# Patient Record
Sex: Male | Born: 1960 | Race: White | Hispanic: No | Marital: Married | Smoking: Former smoker
Health system: Southern US, Community
[De-identification: ages and names within clinical notes are randomized; demographics above are authoritative.]

## PROBLEM LIST (undated history)

## (undated) ENCOUNTER — Emergency Department (HOSPITAL_COMMUNITY)
Discharge status: Against Medical Advice | Payer: Self-pay | Attending: Emergency Medicine | Admitting: Emergency Medicine

## (undated) DIAGNOSIS — R490 Dysphonia: Secondary | ICD-10-CM

## (undated) DIAGNOSIS — R7303 Prediabetes: Secondary | ICD-10-CM

## (undated) DIAGNOSIS — C801 Malignant (primary) neoplasm, unspecified: Secondary | ICD-10-CM

## (undated) DIAGNOSIS — C7931 Secondary malignant neoplasm of brain: Secondary | ICD-10-CM

## (undated) DIAGNOSIS — Z87442 Personal history of urinary calculi: Secondary | ICD-10-CM

## (undated) DIAGNOSIS — G90513 Complex regional pain syndrome I of upper limb, bilateral: Secondary | ICD-10-CM

## (undated) DIAGNOSIS — J432 Centrilobular emphysema: Secondary | ICD-10-CM

## (undated) DIAGNOSIS — G540 Brachial plexus disorders: Secondary | ICD-10-CM

## (undated) DIAGNOSIS — J90 Pleural effusion, not elsewhere classified: Secondary | ICD-10-CM

## (undated) DIAGNOSIS — I10 Essential (primary) hypertension: Secondary | ICD-10-CM

## (undated) DIAGNOSIS — E039 Hypothyroidism, unspecified: Secondary | ICD-10-CM

## (undated) DIAGNOSIS — E119 Type 2 diabetes mellitus without complications: Secondary | ICD-10-CM

## (undated) DIAGNOSIS — I609 Nontraumatic subarachnoid hemorrhage, unspecified: Secondary | ICD-10-CM

## (undated) DIAGNOSIS — M792 Neuralgia and neuritis, unspecified: Secondary | ICD-10-CM

## (undated) DIAGNOSIS — J189 Pneumonia, unspecified organism: Secondary | ICD-10-CM

## (undated) DIAGNOSIS — C7949 Secondary malignant neoplasm of other parts of nervous system: Secondary | ICD-10-CM

## (undated) DIAGNOSIS — J449 Chronic obstructive pulmonary disease, unspecified: Secondary | ICD-10-CM

## (undated) DIAGNOSIS — J45909 Unspecified asthma, uncomplicated: Secondary | ICD-10-CM

## (undated) HISTORY — PX: COLONOSCOPY: SHX174

## (undated) HISTORY — DX: Essential (primary) hypertension: I10

## (undated) HISTORY — PX: LEG SURGERY: SHX1003

---
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
## 2010-01-20 ENCOUNTER — Telehealth (INDEPENDENT_AMBULATORY_CARE_PROVIDER_SITE_OTHER): Payer: Self-pay | Admitting: *Deleted

## 2010-01-21 ENCOUNTER — Telehealth (INDEPENDENT_AMBULATORY_CARE_PROVIDER_SITE_OTHER): Payer: Self-pay | Admitting: *Deleted

## 2010-01-22 ENCOUNTER — Ambulatory Visit: Payer: Self-pay

## 2010-01-22 ENCOUNTER — Encounter: Payer: Self-pay | Admitting: Cardiology

## 2010-01-22 ENCOUNTER — Ambulatory Visit: Payer: Self-pay | Admitting: Cardiology

## 2010-01-22 ENCOUNTER — Encounter (HOSPITAL_COMMUNITY): Admission: RE | Admit: 2010-01-22 | Discharge: 2010-04-09 | Payer: Self-pay | Admitting: Family Medicine

## 2010-03-31 ENCOUNTER — Telehealth (INDEPENDENT_AMBULATORY_CARE_PROVIDER_SITE_OTHER): Payer: Self-pay | Admitting: *Deleted

## 2010-04-08 ENCOUNTER — Telehealth (INDEPENDENT_AMBULATORY_CARE_PROVIDER_SITE_OTHER): Payer: Self-pay | Admitting: *Deleted

## 2010-09-02 NOTE — Progress Notes (Signed)
  Phone Note Other Incoming   Request: Send information Summary of Call: Request received from [employer] forwarded to [employer].

## 2010-09-02 NOTE — Progress Notes (Signed)
  Records request recieved from [employer] sent to [employer]. Brian [name]  March 31, 2010 8:54 AM

## 2010-09-02 NOTE — Assessment & Plan Note (Signed)
Summary: Cardiology Nuclear Study  Nuclear Med Background Indications for Stress Test: Evaluation for Ischemia   History: GXT  History Comments:  ~21yrs ago GXT:OK per patient  Symptoms: Chest Pressure, Dizziness, Fatigue, Light-Headedness, Rapid HR  Symptoms Comments: Stress. Last episode of CP:2 weeks ago.   Nuclear Pre-Procedure Cardiac Risk Factors: History of Smoking, Hypertension Caffeine/Decaff Intake: None NPO After: 7:30 PM Lungs: Clear IV 0.9% NS with Angio Cath: 20g     IV Site: (R) AC IV Started by: Irean Hong RN Chest Size (in) 40     Height (in): 68 Weight (lb): 179 BMI: 27.32  Nuclear Med Study 1 or 2 day study:  1 day     Stress Test Type:  Stress Reading MD:  Olga Millers, MD     Referring MD:  Keturah Barre, MD Resting Radionuclide:  Technetium 3m Tetrofosmin     Resting Radionuclide Dose:  11 mCi  Stress Radionuclide:  Technetium 68m Tetrofosmin     Stress Radionuclide Dose:  33 mCi   Stress Protocol Exercise Time (min):  11:31 min     Max HR:  173 bpm     Predicted Max HR:  174 bpm  Max Systolic BP: 165 mm Hg     Percent Max HR:  99.43 %     METS: 13.7 Rate Pressure Product:  47829    Stress Test Technologist:  [employer] CMA-N     Nuclear Technologist:  Domenic Polite CNMT  Rest Procedure  Myocardial perfusion imaging was performed at rest 45 minutes following the intravenous administration of Myoview Technetium 22m Tetrofosmin.  Stress Procedure  The patient exercised for 11:31.  The patient stopped due to fatigue.  He did c/o chest pressure, 5/10, at peak exercise; question related to dyspnea, it completely resolved after stopping.  There were no diagnostic ST-T wave changes.  Myoview was injected at peak exercise and myocardial perfusion imaging was performed after a brief delay.  QPS Raw Data Images:  Acuisition technically good; normal left ventricular size. Stress Images:  There is normal uptake in all areas. Rest Images:  Normal  homogeneous uptake in all areas of the myocardium. Subtraction (SDS):  No evidence of ischemia. Transient Ischemic Dilatation:  1.07  (Normal <1.22)  Lung/Heart Ratio:  .3  (Normal <0.45)  Quantitative Gated Spect Images QGS EDV:  95 ml QGS ESV:  47 ml QGS EF:  51 % QGS cine images:  Normal wall motion.   Overall Impression  Exercise Capacity: Excellent exercise capacity. BP Response: Normal blood pressure response. Clinical Symptoms: There is chest pain ECG Impression: No significant ST segment change suggestive of ischemia. Overall Impression: There is no sign of scar or ischemia.

## 2010-09-02 NOTE — Progress Notes (Signed)
Summary: Nuclear Pre-Procedure  Phone Note Outgoing Call Call back at Work Phone  Call back at [phone]   Call placed by: Stanton Kidney, EMT-P,  January 21, 2010 10:23 AM Call placed to: Patient Action Taken: Phone Call Completed Summary of Call: Reviewed information on Myoview Information Sheet (see scanned document for further details).  Spoke with Patient.    Nuclear Med Background Indications for Stress Test: Evaluation for Ischemia     Symptoms: Chest Pain, Chest Pressure, DOE    Nuclear Pre-Procedure Cardiac Risk Factors: Hypertension

## 2010-09-02 NOTE — Progress Notes (Signed)
Summary: Nuclear pre procedure  Phone Note Outgoing Call Call back at Home Phone [phone]   Call placed by: [employer], CMA,  January 20, 2010 3:33 PM Call placed to: Patient Summary of Call: Left message with information on Myoview Information Sheet (see scanned document for details).      Nuclear Med Background Indications for Stress Test: Evaluation for Ischemia     Symptoms: Chest Pain, Chest Pressure, DOE    Nuclear Pre-Procedure Cardiac Risk Factors: Hypertension

## 2011-07-31 DIAGNOSIS — Z0389 Encounter for observation for other suspected diseases and conditions ruled out: Secondary | ICD-10-CM | POA: Insufficient documentation

## 2013-09-05 ENCOUNTER — Encounter: Payer: Self-pay | Admitting: Internal Medicine

## 2013-10-16 ENCOUNTER — Ambulatory Visit (INDEPENDENT_AMBULATORY_CARE_PROVIDER_SITE_OTHER): Payer: BC Managed Care – PPO

## 2013-10-16 ENCOUNTER — Encounter: Payer: Self-pay | Admitting: Podiatry

## 2013-10-16 ENCOUNTER — Ambulatory Visit (INDEPENDENT_AMBULATORY_CARE_PROVIDER_SITE_OTHER): Payer: BC Managed Care – PPO | Admitting: Podiatry

## 2013-10-16 VITALS — BP 141/80 | HR 79 | Resp 12

## 2013-10-16 DIAGNOSIS — M722 Plantar fascial fibromatosis: Secondary | ICD-10-CM

## 2013-10-16 MED ORDER — TRIAMCINOLONE ACETONIDE 10 MG/ML IJ SUSP
10.0000 mg | Freq: Once | INTRAMUSCULAR | Status: AC
  Administered 2013-10-16: 10 mg

## 2013-10-16 MED ORDER — DICLOFENAC SODIUM 75 MG PO TBEC: 75.0000 mg | DELAYED_RELEASE_TABLET | Freq: Two times a day (BID) | ORAL | Status: DC

## 2013-10-16 NOTE — Patient Instructions (Signed)

## 2013-10-16 NOTE — Progress Notes (Signed)
   Subjective:    Patient ID: Brian [name], male    DOB: March 09, 1961, 53 y.o.   MRN: [id number]  HPI PT STATED RT FOOT ARCH AND HEEL IS PAINFUL FOR 8 MONTHS. THE FOOT IS LITTLE GETTING BETTER. THE FOOT GET AGGRAVATED FIRST THING IN THE MORNING STEPPING DOWN THE BED AND PRESSURE. TRIED  STRETCHING AND HELPS A LITTLE SOME.    Review of Systems  HENT: Positive for sinus pressure.   Neurological: Positive for headaches.  All other systems reviewed and are negative.       Objective:   Physical Exam        Assessment & Plan:

## 2013-10-16 NOTE — Progress Notes (Signed)
Subjective:     Patient ID: Brian [name], male   DOB: [birth year], 53 y.o.   MRN: [id number]  HPI patient states my right heel has been killing me and makes it very hard for me to exercise or do any form of activity or work with comfort. Patient states the pain has been present for approximately 8 months   Review of Systems  All other systems reviewed and are negative.       Objective:   Physical Exam  Nursing note and vitals reviewed. Constitutional: He is oriented to person, place, and time.  Cardiovascular: Intact distal pulses.   Musculoskeletal: Normal range of motion.  Neurological: He is oriented to person, place, and time.  Skin: Skin is warm.       Assessment:     Plantar fasciitis of the right heel with inflammation and fluid buildup    Plan:     H&P and x-ray reviewed and today I injected the right plantar fascia 3 mg Kenalog 5 mg Xylocaine Marcaine mixture and applied fascially brace with instructions on usage

## 2013-10-18 ENCOUNTER — Ambulatory Visit (AMBULATORY_SURGERY_CENTER): Payer: Self-pay | Admitting: *Deleted

## 2013-10-18 VITALS — Ht 68.0 in | Wt 188.8 lb

## 2013-10-18 DIAGNOSIS — Z1211 Encounter for screening for malignant neoplasm of colon: Secondary | ICD-10-CM

## 2013-10-18 MED ORDER — NA SULFATE-K SULFATE-MG SULF 17.5-3.13-1.6 GM/177ML PO SOLN: ORAL | Status: DC

## 2013-10-18 NOTE — Progress Notes (Signed)
Patient denies any allergies to eggs or soy. Patient denies any problems with anesthesia.  

## 2013-10-25 ENCOUNTER — Ambulatory Visit (INDEPENDENT_AMBULATORY_CARE_PROVIDER_SITE_OTHER): Payer: BC Managed Care – PPO | Admitting: Podiatry

## 2013-10-25 ENCOUNTER — Encounter: Payer: Self-pay | Admitting: Podiatry

## 2013-10-25 ENCOUNTER — Encounter: Payer: Self-pay | Admitting: Internal Medicine

## 2013-10-25 VITALS — BP 153/88 | HR 88 | Resp 16

## 2013-10-25 DIAGNOSIS — M722 Plantar fascial fibromatosis: Secondary | ICD-10-CM

## 2013-10-26 NOTE — Progress Notes (Signed)
Subjective:     Patient ID: Brian [name], male   DOB: 1960-09-05, 53 y.o.   MRN: [id number]  HPI patient states my heel is improving with brace but I still have discomfort with a lot of ambulation or after periods of sitting   Review of Systems     Objective:   Physical Exam Neurovascular status intact with depressed arch noted both feet indicating mechanical dysfunction with inflammation and pain still noted at the medial band right    Assessment:     Plantar fasciitis improved but still tender upon palpation    Plan:     Explained mechanical element of condition and the importance of physical therapy. I scanned for custom orthotics to reduce stress against the heels and he will reappoint 2 weeks for fitting

## 2013-11-01 ENCOUNTER — Encounter: Payer: Self-pay | Admitting: Internal Medicine

## 2013-11-17 ENCOUNTER — Ambulatory Visit (AMBULATORY_SURGERY_CENTER): Payer: BC Managed Care – PPO | Admitting: Internal Medicine

## 2013-11-17 ENCOUNTER — Encounter: Payer: Self-pay | Admitting: Internal Medicine

## 2013-11-17 VITALS — BP 133/90 | HR 61 | Temp 98.2°F | Resp 13 | Ht 68.0 in | Wt 188.0 lb

## 2013-11-17 DIAGNOSIS — Z1211 Encounter for screening for malignant neoplasm of colon: Secondary | ICD-10-CM

## 2013-11-17 MED ORDER — SODIUM CHLORIDE 0.9 % IV SOLN: 500.0000 mL | INTRAVENOUS | Status: DC

## 2013-11-17 NOTE — Progress Notes (Signed)
Report to pacu rn, vss, bbs=clear 

## 2013-11-17 NOTE — Op Note (Signed)
[place]  [address] [place], [id number]   COLONOSCOPY PROCEDURE REPORT  PATIENT: Brian [name], Brian [name]  MR#: [id number] BIRTHDATE: 10-16-1960 , 53  yrs. old GENDER: Male ENDOSCOPIST: Gatha Mayer, MD, [facility] REFERRED QM:VHQION Unk Lightning, M.D. PROCEDURE DATE:  11/17/2013 PROCEDURE:   Colonoscopy, screening First Screening Colonoscopy - Avg.  risk and is 50 yrs.  old or older Yes.  Prior Negative Screening - Now for repeat screening. N/A  History of Adenoma - Now for follow-up colonoscopy & has been > or = to 3 yrs.  N/A  Polyps Removed Today? No.  Recommend repeat exam, <10 yrs? No. ASA CLASS:   Class II INDICATIONS:average risk screening and first colonoscopy. MEDICATIONS: propofol (Diprivan) 300mg  IV, MAC sedation, administered by CRNA, and These medications were titrated to patient response per physician's verbal order  DESCRIPTION OF PROCEDURE:   After the risks benefits and alternatives of the procedure were thoroughly explained, informed consent was obtained.  A digital rectal exam revealed no abnormalities of the rectum, A digital rectal exam revealed no prostatic nodules, and A digital rectal exam revealed the prostate was not enlarged.   The LB [id number]  endoscope was introduced through the anus and advanced to the cecum, which was identified by both the appendix and ileocecal valve. No adverse events experienced.   The quality of the prep was excellent using Suprep  The instrument was then slowly withdrawn as the colon was fully examined.      COLON FINDINGS: A normal appearing cecum, ileocecal valve, and appendiceal orifice were identified.  The ascending, hepatic flexure, transverse, splenic flexure, descending, sigmoid colon and rectum appeared unremarkable.  No polyps or cancers were seen.   A right colon retroflexion was performed.  Retroflexed views revealed no abnormalities. The time to cecum=1 minutes 46 seconds. Withdrawal time=10  minutes 23 seconds.  The scope was withdrawn and the procedure completed. COMPLICATIONS: There were no complications.  ENDOSCOPIC IMPRESSION: Normal colonoscopy - excellent prep  RECOMMENDATIONS: Repeat colonoscopy 10 years.   eSigned:  Gatha Mayer, MD, [facility] 11/17/2013 9:44 AM   cc: The Patient and Janace Litten, MD

## 2013-11-17 NOTE — Patient Instructions (Addendum)
Your colonoscopy was normal. No polyps!  Next routine colonoscopy in 10 years - 2025  I appreciate the opportunity to care for you. Gatha Mayer, MD, FACG  YOU HAD AN ENDOSCOPIC PROCEDURE TODAY AT [facility]: Refer to the procedure report that was given to you for any specific questions about what was found during the examination.  If the procedure report does not answer your questions, please call your gastroenterologist to clarify.  If you requested that your care partner not be given the details of your procedure findings, then the procedure report has been included in a sealed envelope for you to review at your convenience later.  YOU SHOULD EXPECT: Some feelings of bloating in the abdomen. Passage of more gas than usual.  Walking can help get rid of the air that was put into your GI tract during the procedure and reduce the bloating. If you had a lower endoscopy (such as a colonoscopy or flexible sigmoidoscopy) you may notice spotting of blood in your stool or on the toilet paper. If you underwent a bowel prep for your procedure, then you may not have a normal bowel movement for a few days.  DIET: Your first meal following the procedure should be a light meal and then it is ok to progress to your normal diet.  A half-sandwich or bowl of soup is an example of a good first meal.  Heavy or fried foods are harder to digest and may make you feel nauseous or bloated.  Likewise meals heavy in dairy and vegetables can cause extra gas to form and this can also increase the bloating.  Drink plenty of fluids but you should avoid alcoholic beverages for 24 hours.  ACTIVITY: Your care partner should take you home directly after the procedure.  You should plan to take it easy, moving slowly for the rest of the day.  You can resume normal activity the day after the procedure however you should NOT DRIVE or use heavy machinery for 24 hours (because of the sedation medicines used during the  test).    SYMPTOMS TO REPORT IMMEDIATELY: A gastroenterologist can be reached at any hour.  During normal business hours, 8:30 AM to 5:00 PM Monday through Friday, call [phone].  After hours and on weekends, please call the GI answering service at [phone] who will take a message and have the physician on call contact you.   Following lower endoscopy (colonoscopy or flexible sigmoidoscopy):  Excessive amounts of blood in the stool  Significant tenderness or worsening of abdominal pains  Swelling of the abdomen that is new, acute  Fever of 100F or higher    FOLLOW UP: If any biopsies were taken you will be contacted by phone or by letter within the next 1-3 weeks.  Call your gastroenterologist if you have not heard about the biopsies in 3 weeks.  Our staff will call the home number listed on your records the next business day following your procedure to check on you and address any questions or concerns that you may have at that time regarding the information given to you following your procedure. This is a courtesy call and so if there is no answer at the home number and we have not heard from you through the emergency physician on call, we will assume that you have returned to your regular daily activities without incident.  SIGNATURES/CONFIDENTIALITY: You and/or your care partner have signed paperwork which will be entered into your electronic medical  record.  These signatures attest to the fact that that the information above on your After Visit Summary has been reviewed and is understood.  Full responsibility of the confidentiality of this discharge information lies with you and/or your care-partner.

## 2013-11-20 ENCOUNTER — Telehealth: Payer: Self-pay | Admitting: *Deleted

## 2013-11-20 NOTE — Telephone Encounter (Signed)
No answer, left message to call if questions or concerns. 

## 2013-12-21 ENCOUNTER — Telehealth: Payer: Self-pay | Admitting: *Deleted

## 2013-12-21 NOTE — Telephone Encounter (Signed)
I left him a message that the orthotics should be here some time next week.  We called the company and had them to put a rush on it.  We will call once they come in.  Please call if you have any further questions or concerns.

## 2013-12-21 NOTE — Telephone Encounter (Signed)
Calling to chek on prosthetic insoles that have been ordered for me.

## 2014-01-03 ENCOUNTER — Telehealth: Payer: Self-pay | Admitting: *Deleted

## 2014-01-03 NOTE — Telephone Encounter (Signed)
Calling to check on orthotic order that's been out there for a couple of months now.  I was told I would receive a call last week and I haven't received it.  I'd appreciate a call.  I returned his call and informed him the orthotics are here.  I informed him he will need to schedule an appointment to pick them up.  He asked why.  I told him to make sure they fit properly.  He asked to schedule an appointment for Monday.  I transferred him to a scheduler.

## 2014-01-11 ENCOUNTER — Other Ambulatory Visit: Payer: BC Managed Care – PPO

## 2014-08-03 DIAGNOSIS — I609 Nontraumatic subarachnoid hemorrhage, unspecified: Secondary | ICD-10-CM

## 2014-08-03 HISTORY — DX: Nontraumatic subarachnoid hemorrhage, unspecified: I60.9

## 2015-05-21 ENCOUNTER — Encounter (HOSPITAL_COMMUNITY): Payer: Self-pay | Admitting: Vascular Surgery

## 2015-05-21 ENCOUNTER — Inpatient Hospital Stay (HOSPITAL_COMMUNITY)
Admission: EM | Admit: 2015-05-21 | Discharge: 2015-05-29 | DRG: 066 | Disposition: A | Discharge status: Home / Self Care | Payer: BLUE CROSS/BLUE SHIELD | Attending: Neurosurgery | Admitting: Neurosurgery

## 2015-05-21 ENCOUNTER — Emergency Department (HOSPITAL_COMMUNITY): Payer: BLUE CROSS/BLUE SHIELD

## 2015-05-21 DIAGNOSIS — I10 Essential (primary) hypertension: Secondary | ICD-10-CM | POA: Diagnosis present

## 2015-05-21 DIAGNOSIS — E785 Hyperlipidemia, unspecified: Secondary | ICD-10-CM | POA: Diagnosis present

## 2015-05-21 DIAGNOSIS — I608 Other nontraumatic subarachnoid hemorrhage: Secondary | ICD-10-CM | POA: Diagnosis not present

## 2015-05-21 DIAGNOSIS — R51 Headache: Secondary | ICD-10-CM | POA: Diagnosis not present

## 2015-05-21 DIAGNOSIS — I609 Nontraumatic subarachnoid hemorrhage, unspecified: Secondary | ICD-10-CM

## 2015-05-21 HISTORY — DX: Type 2 diabetes mellitus without complications: E11.9

## 2015-05-21 LAB — DIFFERENTIAL
BASOS ABS: 0 10*3/uL (ref 0.0–0.1)
Basophils Relative: 0 %
EOS ABS: 0.4 10*3/uL (ref 0.0–0.7)
Eosinophils Relative: 4 %
LYMPHS ABS: 2.6 10*3/uL (ref 0.7–4.0)
LYMPHS PCT: 24 %
MONOS PCT: 11 %
Monocytes Absolute: 1.2 10*3/uL — ABNORMAL HIGH (ref 0.1–1.0)
NEUTROS ABS: 6.6 10*3/uL (ref 1.7–7.7)
NEUTROS PCT: 61 %

## 2015-05-21 LAB — I-STAT CHEM 8, ED
BUN: 25 mg/dL — ABNORMAL HIGH (ref 6–20)
CHLORIDE: 102 mmol/L (ref 101–111)
Calcium, Ion: 1.1 mmol/L — ABNORMAL LOW (ref 1.12–1.23)
Creatinine, Ser: 0.9 mg/dL (ref 0.61–1.24)
GLUCOSE: 123 mg/dL — AB (ref 65–99)
HCT: 43 % (ref 39.0–52.0)
HEMOGLOBIN: 14.6 g/dL (ref 13.0–17.0)
POTASSIUM: 3.5 mmol/L (ref 3.5–5.1)
SODIUM: 136 mmol/L (ref 135–145)
TCO2: 21 mmol/L (ref 0–100)

## 2015-05-21 LAB — CBC
HEMATOCRIT: 40.2 % (ref 39.0–52.0)
HEMOGLOBIN: 13.8 g/dL (ref 13.0–17.0)
MCH: 30.7 pg (ref 26.0–34.0)
MCHC: 34.3 g/dL (ref 30.0–36.0)
MCV: 89.5 fL (ref 78.0–100.0)
Platelets: 166 10*3/uL (ref 150–400)
RBC: 4.49 MIL/uL (ref 4.22–5.81)
RDW: 12.8 % (ref 11.5–15.5)
WBC: 10.8 10*3/uL — AB (ref 4.0–10.5)

## 2015-05-21 LAB — PROTIME-INR
INR: 1.13 (ref 0.00–1.49)
Prothrombin Time: 14.7 seconds (ref 11.6–15.2)

## 2015-05-21 LAB — I-STAT TROPONIN, ED: Troponin i, poc: 0 ng/mL (ref 0.00–0.08)

## 2015-05-21 LAB — APTT: APTT: 28 s (ref 24–37)

## 2015-05-21 LAB — CBG MONITORING, ED: GLUCOSE-CAPILLARY: 121 mg/dL — AB (ref 65–99)

## 2015-05-21 MED ORDER — ONDANSETRON HCL 4 MG/2ML IJ SOLN
4.0000 mg | Freq: Once | INTRAMUSCULAR | Status: AC
  Administered 2015-05-22: 4 mg via INTRAVENOUS
  Filled 2015-05-21: qty 2

## 2015-05-21 MED ORDER — MORPHINE SULFATE (PF) 4 MG/ML IV SOLN
4.0000 mg | Freq: Once | INTRAVENOUS | Status: AC
  Administered 2015-05-22: 4 mg via INTRAVENOUS
  Filled 2015-05-21: qty 1

## 2015-05-21 MED ORDER — SODIUM CHLORIDE 0.9 % IV BOLUS (SEPSIS)
1000.0000 mL | Freq: Once | INTRAVENOUS | Status: AC
  Administered 2015-05-22: 1000 mL via INTRAVENOUS

## 2015-05-21 NOTE — ED Notes (Signed)
Spoke with pod A physician who stated to perform a head CT but do not need to call a Code Stroke.

## 2015-05-21 NOTE — ED Notes (Signed)
Pt reports to the ED for eval of sudden onset HA that began 2200. Pt has associated neck pain. Took 2 Aleve and a hot shower but nothing helped. Also having nausea Denies any active. Grips equal, no facial droop, no arm drift. Pt denies any numbness or unilateral weakness. Some left leg tingling that he just noticed. Denies any vision changes. Light makes the pain worse. Pt A&Ox4, resp e/u, and skin warm and dry.

## 2015-05-21 NOTE — ED Provider Notes (Signed)
CSN: [id number]     Arrival date & time 05/21/15  2304 History   By signing my name below, I, Forrestine Him, attest that this documentation has been prepared under the direction and in the presence of Merryl Hacker, MD.  Electronically Signed: Forrestine Him, ED Scribe. 05/21/2015. 11:58 PM.   Chief Complaint  Patient presents with  . Headache   The history is provided by the patient. No language interpreter was used.    HPI Comments: Brian [name] is a 54 y.o. male who presents to the Emergency Department complaining of sudden onset, constant, ongoing HA with associated neck pain and nausea onset 10:00 PM this evening. Currently he rates pain 10/10. No aggravating or alleviating factors at this time. Mild L lower extremity tingling also reported. OTC Aleve attempted prior to arrival without any improvement. Denies any fever, chills, chest pain, or shortness of breath. No numbness or unilateral weakness. Mr. Brar denies any history of same. He is not currently on any anticoagulants. No known allergies to medications.  PCP: [name], MD    Past Medical History  Diagnosis Date  . Hypertension    Past Surgical History  Procedure Laterality Date  . Leg surgery  age 67     left leg, from fracture   Family History  Problem Relation Age of Onset  . Colon cancer Neg Hx   . Stomach cancer Neg Hx    Social History  Substance Use Topics  . Smoking status: Never Smoker   . Smokeless tobacco: Never Used  . Alcohol Use: 2.5 oz/week    5 Standard drinks or equivalent per week     Comment: occasionally    Review of Systems  Constitutional: Negative for fever and chills.  Respiratory: Negative for cough and shortness of breath.   Cardiovascular: Negative for chest pain.  Gastrointestinal: Positive for nausea. Negative for vomiting and abdominal pain.  Musculoskeletal: Positive for neck pain. Negative for back pain.  Neurological: Positive for headaches. Negative for  dizziness, speech difficulty, weakness and numbness.  Psychiatric/Behavioral: Negative for confusion.  All other systems reviewed and are negative.     Allergies  Review of patient's allergies indicates no known allergies.  Home Medications   Prior to Admission medications   Medication Sig Start Date End Date Taking? Authorizing Provider  atorvastatin (LIPITOR) 20 MG tablet Take 20 mg by mouth daily.   Yes Historical Provider, MD  Cyanocobalamin (VITAMIN B 12 PO) Take 1 tablet by mouth daily.   Yes Historical Provider, MD  metFORMIN (GLUCOPHAGE) 500 MG tablet Take 500 mg by mouth daily with breakfast.   Yes Historical Provider, MD  Multiple Vitamin (MULTIVITAMIN) tablet Take 1 tablet by mouth daily.   Yes Historical Provider, MD  ramipril (ALTACE) 10 MG capsule Take 10 mg by mouth daily.   Yes Historical Provider, MD   Triage Vitals: BP 148/85 mmHg  Pulse 80  Temp(Src) 97.8 F (36.6 C) (Oral)  Resp 18  SpO2 100%   Physical Exam  Constitutional: He is oriented to person, place, and time. No distress.  Uncomfortable appearing, lying still on the bed, eyes closed  HENT:  Head: Normocephalic and atraumatic.  Eyes: EOM are normal. Pupils are equal, round, and reactive to light.  Neck: Normal range of motion. Neck supple.  Cardiovascular: Normal rate, regular rhythm and normal heart sounds.   No murmur heard. Pulmonary/Chest: Effort normal and breath sounds normal. No respiratory distress. He has no wheezes.  Abdominal: Soft. Bowel sounds are  normal. There is no tenderness. There is no rebound.  Musculoskeletal: He exhibits no edema.  Neurological: He is alert and oriented to person, place, and time.  Normal grip strength bilaterally, 5 out of 5 strength in the bilateral lower extremities, normal reflexes, cranial nerves II through XII intact  Skin: Skin is warm and dry.  Psychiatric: He has a normal mood and affect.  Nursing note and vitals reviewed.   ED Course  Procedures  (including critical care time)  CRITICAL CARE Performed by: Merryl Hacker   Total critical care time: 30 min  Critical care time was exclusive of separately billable procedures and treating other patients.  Critical care was necessary to treat or prevent imminent or life-threatening deterioration.  Critical care was time spent personally by me on the following activities: development of treatment plan with patient and/or surrogate as well as nursing, discussions with consultants, evaluation of patient's response to treatment, examination of patient, obtaining history from patient or surrogate, ordering and performing treatments and interventions, ordering and review of laboratory studies, ordering and review of radiographic studies, pulse oximetry and re-evaluation of patient's condition.   DIAGNOSTIC STUDIES: Oxygen Saturation is 100% on RA, Normal by my interpretation.    COORDINATION OF CARE: 11:50 PM- Will order CT head without contrast, PT-INR, APTT, CBC, CMP, i-stat troponin, CBG, and  i-stat chem 8. Discussed treatment plan with pt at bedside and pt agreed to plan.     Labs Review Labs Reviewed  CBC - Abnormal; Notable for the following:    WBC 10.8 (*)    All other components within normal limits  DIFFERENTIAL - Abnormal; Notable for the following:    Monocytes Absolute 1.2 (*)    All other components within normal limits  COMPREHENSIVE METABOLIC PANEL - Abnormal; Notable for the following:    Glucose, Bld 128 (*)    BUN 23 (*)    Total Protein 6.1 (*)    All other components within normal limits  CBG MONITORING, ED - Abnormal; Notable for the following:    Glucose-Capillary 121 (*)    All other components within normal limits  I-STAT CHEM 8, ED - Abnormal; Notable for the following:    BUN 25 (*)    Glucose, Bld 123 (*)    Calcium, Ion 1.10 (*)    All other components within normal limits  PROTIME-INR  APTT  I-STAT TROPOININ, ED    Imaging Review Ct Head  Wo Contrast  05/21/2015  CLINICAL DATA:  Acute onset of headache, nausea, vomiting and neck stiffness. Initial encounter. EXAM: CT HEAD WITHOUT CONTRAST TECHNIQUE: Contiguous axial images were obtained from the base of the skull through the vertex without intravenous contrast. COMPARISON:  None. FINDINGS: There is subarachnoid hemorrhage along the anterior aspect of the pons, tracking inferiorly along the brainstem. The pattern is compatible with perimesencephalic subarachnoid hemorrhage. Most commonly, this is non-aneurysmal in nature, though CTA of the head is recommended for further evaluation. The cerebellum, brainstem and fourth ventricle are within normal limits. The third and lateral ventricles, and basal ganglia are unremarkable in appearance. The cerebral hemispheres are symmetric in appearance, with normal gray-white differentiation. No mass effect or midline shift is seen. There is no evidence of fracture; visualized osseous structures are unremarkable in appearance. The orbits are within normal limits. The paranasal sinuses and mastoid air cells are well-aerated. No significant soft tissue abnormalities are seen. IMPRESSION: Subarachnoid hemorrhage along the anterior aspect of the pons, tracking inferiorly along the brainstem. The  pattern is compatible with perimesencephalic subarachnoid hemorrhage. Most commonly, this is non-aneurysmal in nature, though CTA of the head is recommended for further evaluation. Critical Value/emergent results were called by telephone at the time of interpretation on 05/21/2015 at 11:50 pm to Dr. Dina Rich, who verbally acknowledged these results. Electronically Signed   By: Garald Balding M.D.   On: 05/21/2015 23:59   I have personally reviewed and evaluated these images and lab results as part of my medical decision-making.   EKG Interpretation None      MDM   Final diagnoses:  Subarachnoid hemorrhage [facility])    Patient presents with acute onset headache at 10  PM. Was evaluated in triage. No focal deficits. CT scan obtained in triage and shows a subarachnoid hemorrhage. On my evaluation, patient continues to be neurologically intact. He is uncomfortable appearing but nontoxic. Blood pressure in the 130s. History of hypertension/hyperlipidemia.  Neurosurgery consulted. CT angiogram head ordered. Patient given fluids, pain medication, and Zofran. Will admit to neurosurgery, Dr.Nundkumar.  I personally performed the services described in this documentation, which was scribed in my presence. The recorded information has been reviewed and is accurate.   Merryl Hacker, MD 05/22/15 [phone]

## 2015-05-22 ENCOUNTER — Inpatient Hospital Stay (HOSPITAL_COMMUNITY): Payer: BLUE CROSS/BLUE SHIELD

## 2015-05-22 ENCOUNTER — Emergency Department (HOSPITAL_COMMUNITY): Payer: BLUE CROSS/BLUE SHIELD

## 2015-05-22 ENCOUNTER — Encounter (HOSPITAL_COMMUNITY): Payer: Self-pay

## 2015-05-22 ENCOUNTER — Ambulatory Visit (HOSPITAL_COMMUNITY): Payer: BLUE CROSS/BLUE SHIELD

## 2015-05-22 DIAGNOSIS — E785 Hyperlipidemia, unspecified: Secondary | ICD-10-CM | POA: Diagnosis present

## 2015-05-22 DIAGNOSIS — I609 Nontraumatic subarachnoid hemorrhage, unspecified: Secondary | ICD-10-CM

## 2015-05-22 DIAGNOSIS — I608 Other nontraumatic subarachnoid hemorrhage: Secondary | ICD-10-CM | POA: Diagnosis present

## 2015-05-22 DIAGNOSIS — I10 Essential (primary) hypertension: Secondary | ICD-10-CM | POA: Diagnosis present

## 2015-05-22 DIAGNOSIS — R51 Headache: Secondary | ICD-10-CM | POA: Diagnosis present

## 2015-05-22 LAB — COMPREHENSIVE METABOLIC PANEL
ALK PHOS: 48 U/L (ref 38–126)
ALT: 23 U/L (ref 17–63)
ANION GAP: 9 (ref 5–15)
AST: 22 U/L (ref 15–41)
Albumin: 3.9 g/dL (ref 3.5–5.0)
BUN: 23 mg/dL — ABNORMAL HIGH (ref 6–20)
CALCIUM: 9.3 mg/dL (ref 8.9–10.3)
CHLORIDE: 104 mmol/L (ref 101–111)
CO2: 23 mmol/L (ref 22–32)
CREATININE: 0.97 mg/dL (ref 0.61–1.24)
Glucose, Bld: 128 mg/dL — ABNORMAL HIGH (ref 65–99)
Potassium: 3.7 mmol/L (ref 3.5–5.1)
Sodium: 136 mmol/L (ref 135–145)
Total Bilirubin: 0.5 mg/dL (ref 0.3–1.2)
Total Protein: 6.1 g/dL — ABNORMAL LOW (ref 6.5–8.1)

## 2015-05-22 LAB — CBC
HCT: 36.9 % — ABNORMAL LOW (ref 39.0–52.0)
HEMOGLOBIN: 12.6 g/dL — AB (ref 13.0–17.0)
MCH: 30.4 pg (ref 26.0–34.0)
MCHC: 34.1 g/dL (ref 30.0–36.0)
MCV: 89.1 fL (ref 78.0–100.0)
PLATELETS: 149 10*3/uL — AB (ref 150–400)
RBC: 4.14 MIL/uL — AB (ref 4.22–5.81)
RDW: 12.7 % (ref 11.5–15.5)
WBC: 14.9 10*3/uL — ABNORMAL HIGH (ref 4.0–10.5)

## 2015-05-22 LAB — GLUCOSE, CAPILLARY
GLUCOSE-CAPILLARY: 102 mg/dL — AB (ref 65–99)
GLUCOSE-CAPILLARY: 116 mg/dL — AB (ref 65–99)
GLUCOSE-CAPILLARY: 85 mg/dL (ref 65–99)
Glucose-Capillary: 89 mg/dL (ref 65–99)

## 2015-05-22 LAB — MRSA PCR SCREENING: MRSA BY PCR: NEGATIVE

## 2015-05-22 MED ORDER — ACETAMINOPHEN 650 MG RE SUPP: 650.0000 mg | RECTAL | Status: DC | PRN

## 2015-05-22 MED ORDER — SENNOSIDES-DOCUSATE SODIUM 8.6-50 MG PO TABS
1.0000 | ORAL_TABLET | Freq: Two times a day (BID) | ORAL | Status: DC
  Administered 2015-05-22 – 2015-05-28 (×10): 1 via ORAL
  Filled 2015-05-22 (×13): qty 1

## 2015-05-22 MED ORDER — MORPHINE SULFATE (PF) 2 MG/ML IV SOLN
1.0000 mg | INTRAVENOUS | Status: DC | PRN
  Administered 2015-05-22 – 2015-05-25 (×6): 2 mg via INTRAVENOUS
  Filled 2015-05-22 (×8): qty 1

## 2015-05-22 MED ORDER — ATORVASTATIN CALCIUM 20 MG PO TABS
20.0000 mg | ORAL_TABLET | Freq: Every day | ORAL | Status: DC
  Administered 2015-05-22 – 2015-05-28 (×7): 20 mg via ORAL
  Filled 2015-05-22 (×7): qty 1

## 2015-05-22 MED ORDER — NIMODIPINE 30 MG PO CAPS
60.0000 mg | ORAL_CAPSULE | ORAL | Status: DC
  Administered 2015-05-22 – 2015-05-29 (×43): 60 mg via ORAL
  Filled 2015-05-22 (×42): qty 2

## 2015-05-22 MED ORDER — HEPARIN SODIUM (PORCINE) 1000 UNIT/ML IJ SOLN
INTRAMUSCULAR | Status: AC
  Filled 2015-05-22: qty 1

## 2015-05-22 MED ORDER — ACETAMINOPHEN 325 MG PO TABS
650.0000 mg | ORAL_TABLET | ORAL | Status: DC | PRN
  Administered 2015-05-23 – 2015-05-29 (×29): 650 mg via ORAL
  Filled 2015-05-22 (×29): qty 2

## 2015-05-22 MED ORDER — MIDAZOLAM HCL 2 MG/2ML IJ SOLN
INTRAMUSCULAR | Status: AC
  Filled 2015-05-22: qty 2

## 2015-05-22 MED ORDER — LIDOCAINE HCL 1 % IJ SOLN
INTRAMUSCULAR | Status: AC
  Filled 2015-05-22: qty 20

## 2015-05-22 MED ORDER — ONE-DAILY MULTI VITAMINS PO TABS: 1.0000 | ORAL_TABLET | Freq: Every day | ORAL | Status: DC

## 2015-05-22 MED ORDER — PANTOPRAZOLE SODIUM 40 MG IV SOLR
40.0000 mg | Freq: Every day | INTRAVENOUS | Status: DC
  Administered 2015-05-22 (×2): 40 mg via INTRAVENOUS
  Filled 2015-05-22 (×2): qty 40

## 2015-05-22 MED ORDER — VITAMIN B-12 100 MCG PO TABS
100.0000 ug | ORAL_TABLET | Freq: Every day | ORAL | Status: DC
  Administered 2015-05-22 – 2015-05-27 (×6): 100 ug via ORAL
  Filled 2015-05-22 (×8): qty 1

## 2015-05-22 MED ORDER — SODIUM CHLORIDE 0.9 % IV SOLN
INTRAVENOUS | Status: DC
  Administered 2015-05-22 – 2015-05-27 (×8): via INTRAVENOUS

## 2015-05-22 MED ORDER — ADULT MULTIVITAMIN W/MINERALS CH
1.0000 | ORAL_TABLET | Freq: Every day | ORAL | Status: DC
  Administered 2015-05-22 – 2015-05-27 (×6): 1 via ORAL
  Filled 2015-05-22 (×6): qty 1

## 2015-05-22 MED ORDER — FENTANYL CITRATE (PF) 100 MCG/2ML IJ SOLN
INTRAMUSCULAR | Status: AC | PRN
  Administered 2015-05-22: 25 ug via INTRAVENOUS

## 2015-05-22 MED ORDER — FENTANYL CITRATE (PF) 100 MCG/2ML IJ SOLN
INTRAMUSCULAR | Status: AC
  Filled 2015-05-22: qty 2

## 2015-05-22 MED ORDER — IOHEXOL 300 MG/ML  SOLN
150.0000 mL | Freq: Once | INTRAMUSCULAR | Status: DC | PRN
  Administered 2015-05-22: 60 mL via INTRAVENOUS
  Filled 2015-05-22: qty 150

## 2015-05-22 MED ORDER — MIDAZOLAM HCL 2 MG/2ML IJ SOLN
INTRAMUSCULAR | Status: AC | PRN
  Administered 2015-05-22: 0.5 mg via INTRAVENOUS

## 2015-05-22 MED ORDER — METFORMIN HCL 500 MG PO TABS
500.0000 mg | ORAL_TABLET | Freq: Every day | ORAL | Status: DC
  Administered 2015-05-23: 500 mg via ORAL
  Filled 2015-05-22 (×2): qty 1

## 2015-05-22 MED ORDER — RAMIPRIL 10 MG PO CAPS
10.0000 mg | ORAL_CAPSULE | Freq: Every day | ORAL | Status: DC
  Administered 2015-05-22 – 2015-05-27 (×6): 10 mg via ORAL
  Filled 2015-05-22 (×8): qty 1

## 2015-05-22 MED ORDER — NIMODIPINE 60 MG/20ML PO SOLN: 60.0000 mg | ORAL | Status: DC

## 2015-05-22 MED ORDER — VITAMIN B 12 100 MCG PO LOZG: LOZENGE | Freq: Every day | ORAL | Status: DC

## 2015-05-22 MED ORDER — IOHEXOL 350 MG/ML SOLN
80.0000 mL | Freq: Once | INTRAVENOUS | Status: AC | PRN
  Administered 2015-05-22: 80 mL via INTRAVENOUS

## 2015-05-22 MED ORDER — LABETALOL HCL 5 MG/ML IV SOLN: 10.0000 mg | INTRAVENOUS | Status: DC | PRN

## 2015-05-22 MED ORDER — STROKE: EARLY STAGES OF RECOVERY BOOK
Freq: Once | Status: AC
  Administered 2015-05-22: 01:00:00
  Filled 2015-05-22: qty 1

## 2015-05-22 NOTE — Sedation Documentation (Signed)
Patient is resting comfortably. 

## 2015-05-22 NOTE — Plan of Care (Signed)
Problem: Acute Treatment Outcomes Goal: Neuro exam at baseline or improved Outcome: Progressing Patient currently alert and orientated X4. NIH=0.

## 2015-05-22 NOTE — Plan of Care (Signed)
Problem: Acute Treatment Outcomes Goal: 02 Sats > 94% Outcome: Progressing Patient currently satting 100% on 2LNC.

## 2015-05-22 NOTE — H&P (Signed)
CC:  Chief Complaint  Patient presents with  . Headache    HPI: Brian [name] is a 54 y.o. male presenting to the ED after sudden onset of severe HA tonight around 10p. He was going to bed, got up to use the bathroom, and when he returned he had sudden HA and neck stiffness/pain. The pain did not get any better after trying to lay down or taking a shower and he was unable to sleep. He and his wife therefore came to the ED. He denies associated blurry vision, N/T/W, but did have nausea with the HA but no vomiting.   Of note, he has a hx of HTN which is apparently well controlled, no hx of smoking or family hx of aneurysms.  PMH: Past Medical History  Diagnosis Date  . Hypertension     PSH: Past Surgical History  Procedure Laterality Date  . Leg surgery  age 33     left leg, from fracture    SH: Social History  Substance Use Topics  . Smoking status: Never Smoker   . Smokeless tobacco: Never Used  . Alcohol Use: 2.5 oz/week    5 Standard drinks or equivalent per week     Comment: occasionally    MEDS: Prior to Admission medications   Medication Sig Start Date End Date Taking? Authorizing Provider  atorvastatin (LIPITOR) 20 MG tablet Take 20 mg by mouth daily.   Yes Historical Provider, MD  Cyanocobalamin (VITAMIN B 12 PO) Take 1 tablet by mouth daily.   Yes Historical Provider, MD  metFORMIN (GLUCOPHAGE) 500 MG tablet Take 500 mg by mouth daily with breakfast.   Yes Historical Provider, MD  Multiple Vitamin (MULTIVITAMIN) tablet Take 1 tablet by mouth daily.   Yes Historical Provider, MD  ramipril (ALTACE) 10 MG capsule Take 10 mg by mouth daily.   Yes Historical Provider, MD    ALLERGY: No Known Allergies  ROS: ROS  NEUROLOGIC EXAM: Awake, alert, oriented Memory and concentration grossly intact Speech fluent, appropriate CN grossly intact Motor exam: Upper Extremities Deltoid Bicep Tricep Grip  Right 5/5 5/5 5/5 5/5  Left 5/5 5/5 5/5 5/5   Lower Extremity IP  Quad PF DF EHL  Right 5/5 5/5 5/5 5/5 5/5  Left 5/5 5/5 5/5 5/5 5/5   Sensation grossly intact to LT  IMGAING: CTH demonstrates SAH primarily in the perimesensephalic, prepontine, and interpeduncular cisterns. No HCP.  IMPRESSION: - 54 y.o. 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  PLAN: - CTA tonight. - Admit to neuro-ICU - Nimotop, TCD monitoring - SBP goal < 174mHg - Will likely need diagnostic angiogram tomorrow.

## 2015-05-22 NOTE — Sedation Documentation (Signed)
5 Fr. Exoseal applied

## 2015-05-22 NOTE — Op Note (Signed)
DIAGNOSTIC CEREBRAL ANGIOGRAM    OPERATOR:   Dr. Consuella Lose, MD  HISTORY:   The patient is a 54 y.o. yo male admitted with sudden onset of severe HA. ED workup demonstrated perimesencephalic SAH. He presents for diagnostic cerebral angiogram.  APPROACH:   The technical aspects of the procedure as well as its potential risks and benefits were reviewed with the patient. These risks included but were not limited bleeding, infection, allergic reaction, damage to organs/vital structures, stroke, non-diagnostic procedure, and the catastrophic outcomes of heart attack, coma, and death. With an understanding of these risks, informed consent was obtained and witnessed.    The patient was placed in the supine position on the angiography table and the skin of right groin prepped in the usual sterile fashion. The procedure was performed under local anesthesia (1%-solution of bicarbonate-bufferred Lidoacaine) and conscious sedation with Versed and fentanyl monitored by the in-suite nurse.    A 5- French sheath was introduced in the right common femoral artery using Seldinger technique.  A fluorophase sequence was used to document the sheath position.    HEPARIN: 0 Units total.   CONTRAST AGENT: 70cc, Omnipaque 300   FLUOROSCOPY TIME: 5 combined AP and lateral minutes    CATHETER(S) AND WIRE(S):    5-French JB-1 glidecatheter   0.035" glidewire    VESSELS CATHETERIZED:   Right common carotid   Right internal carotid Left common carotid Left common carotid   Right vertebral   Left vertebral   Right common femoral  VESSELS STUDIED:   Right common carotid, neck Right internal carotid, head Right vertebral Left common carotid, neck Left internal carotid, head Left vertebral Right femoral  PROCEDURAL NARRATIVE:   A 5-Fr JB-1 terumo glide catheter was advanced over a 0.035 glidewire into the aortic arch. The above vessels were then sequentially catheterized and cervical/cerebral  angiograms taken. After review of images, the catheter was removed without incident.    INTERPRETATION:   Right common carotid: neck:   There is no significant stenosis, occlusion, aneurysm or plaque visualized on this injection.    Right internal carotid: head:   Injection reveals the presence of a widely patent ICA, M1, and A1 segments and their branches. There is no significant stenosis, occlusion, aneurysm or high flow vascular malformation visualized. No vasospasm. The parenchymal and venous phases are normal. The venous sinuses are widely patent.    Left common carotid: neck:   There is no significant stenosis, occlusion, aneurysm or plaque visualized on this injection.    Left internal carotid: head:   Injection reveals the presence of a widely patent ICA, A1, and M1 segments and their branches. There is no significant stenosis, occlusion, aneurysm, or high flow vascular malformation visualized. No vasospasm. The parenchymal and venous phases are normal. The venous sinuses are widely patent.    Right vertebral:   Injection reveals the presence of a widely patent vertebral artery. This leads to a widely patent basilar artery that terminates in bilateral P1. The basilar apex is normal. There is no significant stenosis, occlusion, aneurysm, or vascular malformation visualized. The parenchymal and venous phases are normal. The venous sinuses are widely patent.    Left vertebral:    Normal vessel. No PICA aneurysm. See basilar description above.    Right femoral:    Normal vessel. No significant atherosclerotic disease. Arterial sheath in adequate position.   DISPOSITION:  Upon completion of the study, the femoral sheath was removed and hemostasis obtained using a 5-Fr ExoSeal closure device.  Good proximal and distal lower extremity pulses were documented upon achievement of hemostasis.    The procedure was well tolerated and no early complications were observed.       The patient was  transferred back to the holding area to be positioned flat in bed for 3 hours of observation.    IMPRESSION:  1. Normal cerebral angiogram, without aneurysm, AVM, or high-flow fistulas.  The preliminary results of this procedure were shared with the patient and the patient's family.

## 2015-05-22 NOTE — Plan of Care (Signed)
Problem: Acute Treatment Outcomes Goal: BP within ordered parameters Outcome: Progressing Patient's blood pressure at 0200 is 145/85. Goal of <160/90 is met.

## 2015-05-22 NOTE — Progress Notes (Signed)
Pt seen and examined. No issues overnight. CTA completed.  EXAM: Temp:  [97.8 F (36.6 C)-98.5 F (36.9 C)] 98.2 F (36.8 C) (10/19 0736) Pulse Rate:  [70-87] 76 (10/19 0900) Resp:  [10-22] 10 (10/19 0900) BP: (113-148)/(65-96) 116/72 mmHg (10/19 0900) SpO2:  [98 %-100 %] 99 % (10/19 0900) Weight:  [76.4 kg (168 lb 6.9 oz)] 76.4 kg (168 lb 6.9 oz) (10/19 0200) Intake/Output      10/18 0701 - 10/19 0700 10/19 0701 - 10/20 0700   I.V. (mL/kg) 455 (6) 200 (2.6)   Total Intake(mL/kg) 455 (6) 200 (2.6)   Urine (mL/kg/hr) 850    Total Output 850     Net -395 +200         Awake, alert, oriented Speech fluent CN intact Good strenght  LABS: Lab Results  Component Value Date   CREATININE 0.90 05/21/2015   BUN 25* 05/21/2015   NA 136 05/21/2015   K 3.5 05/21/2015   CL 102 05/21/2015   CO2 23 05/21/2015   Lab Results  Component Value Date   WBC 14.9* 05/22/2015   HGB 12.6* 05/22/2015   HCT 36.9* 05/22/2015   MCV 89.1 05/22/2015   PLT 149* 05/22/2015    IMAGING: CTA negative for aneurysm  IMPRESSION: - 54 y.o. male with non-traumatic SAH, CTA negative.  PLAN: - Will do confirmatory catheter angiogram this pm  I reviewed CTA findings with the patient and wife. All questions were answered. Risks/beneftis of angiogram were reviewed including stroke, hemorrhage, hematoma, contrast reaction, nephropathy. They are willing to proceed.

## 2015-05-22 NOTE — Progress Notes (Signed)
Vascular Ultrasound Transcranial Doppler has been completed.   05/22/2015 12:22 PM Maudry Mayhew, RVT, RDCS, RDMS

## 2015-05-23 LAB — GLUCOSE, CAPILLARY
GLUCOSE-CAPILLARY: 85 mg/dL (ref 65–99)
GLUCOSE-CAPILLARY: 83 mg/dL (ref 65–99)
GLUCOSE-CAPILLARY: 129 mg/dL — AB (ref 65–99)
GLUCOSE-CAPILLARY: 207 mg/dL — AB (ref 65–99)
Glucose-Capillary: 126 mg/dL — ABNORMAL HIGH (ref 65–99)
Glucose-Capillary: 158 mg/dL — ABNORMAL HIGH (ref 65–99)

## 2015-05-23 MED ORDER — DEXAMETHASONE SODIUM PHOSPHATE 10 MG/ML IJ SOLN
10.0000 mg | Freq: Once | INTRAMUSCULAR | Status: AC
  Administered 2015-05-23: 10 mg via INTRAVENOUS
  Filled 2015-05-23: qty 1

## 2015-05-23 MED ORDER — PANTOPRAZOLE SODIUM 40 MG PO TBEC
40.0000 mg | DELAYED_RELEASE_TABLET | Freq: Every day | ORAL | Status: DC
  Administered 2015-05-23 – 2015-05-28 (×6): 40 mg via ORAL
  Filled 2015-05-23 (×6): qty 1

## 2015-05-23 NOTE — Progress Notes (Signed)
Pt seen and examined. No issues overnight. HA 2-3/10, unchanged. Worse when moving.  EXAM: Temp:  [97.7 F (36.5 C)-98.7 F (37.1 C)] 98.1 F (36.7 C) (10/20 0755) Pulse Rate:  [68-101] 88 (10/20 0900) Resp:  [5-24] 5 (10/20 0800) BP: (105-141)/(61-96) 121/78 mmHg (10/20 0922) SpO2:  [96 %-100 %] 98 % (10/20 0900) Intake/Output      10/19 0701 - 10/20 0700 10/20 0701 - 10/21 0700   P.O. 240    I.V. (mL/kg) 2300 (30.1) 100 (1.3)   Total Intake(mL/kg) 2540 (33.2) 100 (1.3)   Urine (mL/kg/hr) 3200 (1.7)    Total Output 3200     Net -660 +100         Awake, alert, oriented Speech fluent CN intact Good strength  LABS: Lab Results  Component Value Date   CREATININE 0.90 05/21/2015   BUN 25* 05/21/2015   NA 136 05/21/2015   K 3.5 05/21/2015   CL 102 05/21/2015   CO2 23 05/21/2015   Lab Results  Component Value Date   WBC 14.9* 05/22/2015   HGB 12.6* 05/22/2015   HCT 36.9* 05/22/2015   MCV 89.1 05/22/2015   PLT 149* 05/22/2015    IMAGING: Diagnostic angiogram negative for aneurysms.  IMPRESSION: - 54 y.o. male angio neg SAH d# 2, neurologically intact  PLAN: - Cont supportive care - Nimotop, TCD monitoring - Will try steroid for HA

## 2015-05-24 ENCOUNTER — Ambulatory Visit (HOSPITAL_COMMUNITY): Payer: BLUE CROSS/BLUE SHIELD

## 2015-05-24 DIAGNOSIS — I609 Nontraumatic subarachnoid hemorrhage, unspecified: Secondary | ICD-10-CM

## 2015-05-24 LAB — GLUCOSE, CAPILLARY
GLUCOSE-CAPILLARY: 113 mg/dL — AB (ref 65–99)
GLUCOSE-CAPILLARY: 103 mg/dL — AB (ref 65–99)
GLUCOSE-CAPILLARY: 113 mg/dL — AB (ref 65–99)
Glucose-Capillary: 97 mg/dL (ref 65–99)

## 2015-05-24 MED ORDER — METHYLPREDNISOLONE 4 MG PO TBPK
8.0000 mg | ORAL_TABLET | Freq: Every evening | ORAL | Status: AC
  Administered 2015-05-25: 8 mg via ORAL

## 2015-05-24 MED ORDER — METHYLPREDNISOLONE 4 MG PO TBPK
4.0000 mg | ORAL_TABLET | Freq: Four times a day (QID) | ORAL | Status: AC
  Administered 2015-05-26 – 2015-05-29 (×10): 4 mg via ORAL

## 2015-05-24 MED ORDER — METHYLPREDNISOLONE 4 MG PO TBPK
4.0000 mg | ORAL_TABLET | Freq: Three times a day (TID) | ORAL | Status: AC
  Administered 2015-05-25 (×3): 4 mg via ORAL

## 2015-05-24 MED ORDER — METHYLPREDNISOLONE 4 MG PO TBPK
8.0000 mg | ORAL_TABLET | Freq: Every evening | ORAL | Status: AC
  Administered 2015-05-24: 8 mg via ORAL

## 2015-05-24 MED ORDER — METHYLPREDNISOLONE 4 MG PO TBPK
4.0000 mg | ORAL_TABLET | ORAL | Status: AC
  Administered 2015-05-24: 4 mg via ORAL

## 2015-05-24 MED ORDER — METHYLPREDNISOLONE 4 MG PO TBPK
8.0000 mg | ORAL_TABLET | Freq: Every morning | ORAL | Status: AC
  Administered 2015-05-24: 8 mg via ORAL
  Filled 2015-05-24: qty 21

## 2015-05-24 NOTE — Care Management Note (Signed)
Case Management Note  Patient Details  Name: Moussa Wiegand MRN: [id number] Date of Birth: [birth year]  Subjective/Objective:                    Action/Plan:  Ur updated . , follow for DC needs  Expected Discharge Date:                  Expected Discharge Plan:     In-House Referral:     Discharge planning Services     Post Acute Care Choice:    Choice offered to:     DME Arranged:    DME Agency:     HH Arranged:    HH Agency:     Status of Service:  In process, will continue to follow  Medicare Important Message Given:    Date Medicare IM Given:    Medicare IM give by:    Date Additional Medicare IM Given:    Additional Medicare Important Message give by:     If discussed at [place] of Stay Meetings, dates discussed:    Additional Comments:  Marilu Favre, RN 05/24/2015, 8:00 AM

## 2015-05-24 NOTE — Progress Notes (Signed)
Transcranial Doppler  Date POD PCO2 HCT BP  MCA ACA PCA OPHT SIPH VERT Basilar  05/22/15 MA     Right  Left   62  55   -55  -56   50  '30   20  16   '$ 41  41   -31     -35      05/24/15 VS     Right  Left   83  65   -64  -64   -55  -57   '20  25   22  30   '$ -57  -46   -62           Right  Left                                             Right  Left                                             Right  Left                                            Right  Left                                            Right  Left                                        MCA = Middle Cerebral Artery      OPHT = Opthalmic Artery     BASILAR = Basilar Artery   ACA = Anterior Cerebral Artery     SIPH = Carotid Siphon PCA = Posterior Cerebral Artery   VERT = Verterbral Artery                   Normal MCA = 62+\-12 ACA = 50+\-12 PCA = 42+\-23    05/24/2015 4:51 PM Maudry Mayhew, RVT, RDCS, RDMS for [facility], RVS

## 2015-05-24 NOTE — Progress Notes (Signed)
Pt seen and examined. No issues overnight. HA improved today, able to get up, use bathroom.  EXAM: Temp:  [97.7 F (36.5 C)-98.2 F (36.8 C)] 97.8 F (36.6 C) (10/21 0725) Pulse Rate:  [64-92] 64 (10/21 0800) Resp:  [0-21] 16 (10/21 0800) BP: (104-133)/(59-92) 104/59 mmHg (10/21 0800) SpO2:  [94 %-99 %] 97 % (10/21 0800) Intake/Output      10/20 0701 - 10/21 0700 10/21 0701 - 10/22 0700   P.O. 720    I.V. (mL/kg) 2300 (30.1) 300 (3.9)   Total Intake(mL/kg) 3020 (39.5) 300 (3.9)   Urine (mL/kg/hr) 525 (0.3)    Total Output 525     Net +2495 +300        Urine Occurrence 3 x 1 x   Stool Occurrence 1 x     Awake, alert, oriented Speech fluent CN intact Good strength  LABS: Lab Results  Component Value Date   CREATININE 0.90 05/21/2015   BUN 25* 05/21/2015   NA 136 05/21/2015   K 3.5 05/21/2015   CL 102 05/21/2015   CO2 23 05/21/2015   Lab Results  Component Value Date   WBC 14.9* 05/22/2015   HGB 12.6* 05/22/2015   HCT 36.9* 05/22/2015   MCV 89.1 05/22/2015   PLT 149* 05/22/2015   IMPRESSION: - 54 y.o. male angio neg SAH d# 3, neurologically intact  PLAN: - Cont supportive care - Nimotop, TCD monitoring - Will give medrol dosepak

## 2015-05-25 LAB — GLUCOSE, CAPILLARY
GLUCOSE-CAPILLARY: 89 mg/dL (ref 65–99)
Glucose-Capillary: 82 mg/dL (ref 65–99)

## 2015-05-25 MED ORDER — HYDROCODONE-ACETAMINOPHEN 5-325 MG PO TABS
1.0000 | ORAL_TABLET | ORAL | Status: DC | PRN
  Filled 2015-05-25: qty 2

## 2015-05-25 MED ORDER — ONDANSETRON HCL 4 MG/2ML IJ SOLN
4.0000 mg | INTRAMUSCULAR | Status: DC | PRN
  Administered 2015-05-25: 4 mg via INTRAVENOUS
  Filled 2015-05-25: qty 2

## 2015-05-25 NOTE — Progress Notes (Signed)
Overall stable. Still with moderate headache. No other neurologic symptoms.  Awake and alert. Oriented and appropriate. Motor and sensory function intact. Neck supple.  Status post nonaneurysmal subarachnoid hemorrhage. Continue ICU observation. Follow-up arteriogram next week.

## 2015-05-26 LAB — GLUCOSE, CAPILLARY
GLUCOSE-CAPILLARY: 123 mg/dL — AB (ref 65–99)
Glucose-Capillary: 80 mg/dL (ref 65–99)
Glucose-Capillary: 98 mg/dL (ref 65–99)
Glucose-Capillary: 134 mg/dL — ABNORMAL HIGH (ref 65–99)

## 2015-05-26 NOTE — Progress Notes (Signed)
No acute events. AVSS. Awake, alert, oriented. Moving all extremities well, no drift. Arteriogram tomorrow.

## 2015-05-27 ENCOUNTER — Ambulatory Visit (HOSPITAL_COMMUNITY): Payer: BLUE CROSS/BLUE SHIELD

## 2015-05-27 DIAGNOSIS — I609 Nontraumatic subarachnoid hemorrhage, unspecified: Secondary | ICD-10-CM

## 2015-05-27 LAB — GLUCOSE, CAPILLARY
Glucose-Capillary: 86 mg/dL (ref 65–99)
Glucose-Capillary: 153 mg/dL — ABNORMAL HIGH (ref 65–99)
Glucose-Capillary: 120 mg/dL — ABNORMAL HIGH (ref 65–99)
Glucose-Capillary: 122 mg/dL — ABNORMAL HIGH (ref 65–99)

## 2015-05-27 NOTE — Clinical Documentation Improvement (Signed)
Neurology/Neurosurgery  (please document your query response in the progress notes and discharge summary, not on the query form itself.)  The patient has an order for the following medication(s): Medrol Dose Pack ordered on 05/24/15 at 9:38 AM by Dr. Kathyrn Sheriff.  Please document a corresponding diagnosis that supports the use of this medication in the progress notes and discharge summary.   Please exercise your independent, professional judgment when responding. A specific answer is not anticipated or expected.   Thank You,  Erling Conte  RN BSN CCDS [phone] Health Information Management [place]

## 2015-05-27 NOTE — Progress Notes (Signed)
Pt seen and examined. No issues overnight. HA continues to improve, controlled well with PRN tylenol.  EXAM: Temp:  [97.6 F (36.4 C)-98.4 F (36.9 C)] 98.4 F (36.9 C) (10/24 1542) Resp:  [9-29] 14 (10/24 1400) BP: (90-133)/(58-86) 110/75 mmHg (10/24 1400) SpO2:  [95 %-98 %] 95 % (10/24 0600) Intake/Output      10/23 0701 - 10/24 0700 10/24 0701 - 10/25 0700   P.O. 640    I.V. (mL/kg) 2400 (31.4) 800 (10.5)   Total Intake(mL/kg) 3040 (39.8) 800 (10.5)   Net +3040 +800        Urine Occurrence 11 x 4 x   Stool Occurrence 1 x 1 x    Awake, alert, oriented Speech fluent CN intact Good strength  LABS: Lab Results  Component Value Date   CREATININE 0.90 05/21/2015   BUN 25* 05/21/2015   NA 136 05/21/2015   K 3.5 05/21/2015   CL 102 05/21/2015   CO2 23 05/21/2015   Lab Results  Component Value Date   WBC 14.9* 05/22/2015   HGB 12.6* 05/22/2015   HCT 36.9* 05/22/2015   MCV 89.1 05/22/2015   PLT 149* 05/22/2015   IMPRESSION: - 54 y.o. male angio neg SAH d# 6, neurologically intact  PLAN: - Cont supportive care - Nimotop, TCD monitoring - Plan on f/u angio tomorrow or Wed

## 2015-05-27 NOTE — Progress Notes (Signed)
Transcranial Doppler  Date POD PCO2 HCT BP  MCA ACA PCA OPHT SIPH VERT Basilar  05/22/15 MA     Right  Left   62  55   -55  -56   50  '30   20  16   '$ 41  41   -31     -35      05/24/15 VS     Right  Left   83  65   -64  -64   -55  -57   '20  25   22  30   '$ -57  -46   -62      05-27-15 hc     Right  Left   74  68   -60  -53   54  45   18  15   49  38   -45  -53   -51            Right  Left                                             Right  Left                                            Right  Left                                            Right  Left                                        MCA = Middle Cerebral Artery      OPHT = Opthalmic Artery     BASILAR = Basilar Artery   ACA = Anterior Cerebral Artery     SIPH = Carotid Siphon PCA = Posterior Cerebral Artery   VERT = Verterbral Artery                   Normal MCA = 62+\-12 ACA = 50+\-12 PCA = 42+\-23    Ralene Cork, RVT 05/27/2015 11:46 AM

## 2015-05-28 ENCOUNTER — Inpatient Hospital Stay (HOSPITAL_COMMUNITY): Payer: BLUE CROSS/BLUE SHIELD

## 2015-05-28 LAB — GLUCOSE, CAPILLARY
GLUCOSE-CAPILLARY: 91 mg/dL (ref 65–99)
GLUCOSE-CAPILLARY: 116 mg/dL — AB (ref 65–99)
Glucose-Capillary: 86 mg/dL (ref 65–99)
Glucose-Capillary: 130 mg/dL — ABNORMAL HIGH (ref 65–99)

## 2015-05-28 MED ORDER — SODIUM CHLORIDE 0.9 % IV SOLN
INTRAVENOUS | Status: AC | PRN
  Administered 2015-05-28: 10 mL/h via INTRAVENOUS

## 2015-05-28 MED ORDER — FENTANYL CITRATE (PF) 100 MCG/2ML IJ SOLN
INTRAMUSCULAR | Status: AC
  Filled 2015-05-28: qty 2

## 2015-05-28 MED ORDER — FENTANYL CITRATE (PF) 100 MCG/2ML IJ SOLN
INTRAMUSCULAR | Status: AC | PRN
  Administered 2015-05-28: 25 ug via INTRAVENOUS

## 2015-05-28 MED ORDER — HEPARIN SOD (PORK) LOCK FLUSH 100 UNIT/ML IV SOLN
INTRAVENOUS | Status: AC
  Filled 2015-05-28: qty 20

## 2015-05-28 MED ORDER — LIDOCAINE HCL 1 % IJ SOLN
INTRAMUSCULAR | Status: AC
  Filled 2015-05-28: qty 20

## 2015-05-28 MED ORDER — MIDAZOLAM HCL 2 MG/2ML IJ SOLN
INTRAMUSCULAR | Status: AC
  Filled 2015-05-28: qty 2

## 2015-05-28 MED ORDER — MIDAZOLAM HCL 2 MG/2ML IJ SOLN
INTRAMUSCULAR | Status: AC | PRN
  Administered 2015-05-28: 1 mg via INTRAVENOUS

## 2015-05-28 NOTE — Progress Notes (Signed)
Pt seen and examined. No issues overnight.   EXAM: Temp:  [98.1 F (36.7 C)-98.9 F (37.2 C)] 98.3 F (36.8 C) (10/25 0753) Resp:  [9-22] 15 (10/25 0700) BP: (90-138)/(60-91) 123/77 mmHg (10/25 0700) SpO2:  [95 %-96 %] 96 % (10/25 0600) Intake/Output      10/24 0701 - 10/25 0700 10/25 0701 - 10/26 0700   P.O. 120    I.V. (mL/kg) 2400 (31.4)    Total Intake(mL/kg) 2520 (33)    Net +2520          Urine Occurrence 9 x    Stool Occurrence 1 x     Awake, alert, oriented Speech fluent CN intact Good strength  LABS: Lab Results  Component Value Date   CREATININE 0.90 05/21/2015   BUN 25* 05/21/2015   NA 136 05/21/2015   K 3.5 05/21/2015   CL 102 05/21/2015   CO2 23 05/21/2015   Lab Results  Component Value Date   WBC 14.9* 05/22/2015   HGB 12.6* 05/22/2015   HCT 36.9* 05/22/2015   MCV 89.1 05/22/2015   PLT 149* 05/22/2015    IMPRESSION: - 53 y.o. male angio (-) SAH d# 7, remains intact  PLAN: - Will plan on repeat angiogram today

## 2015-05-28 NOTE — Care Management Note (Signed)
Case Management Note  Patient Details  Name: Brian [name] MRN: [id number] Date of Birth: [birth year]  Subjective/Objective:    Pt admitted on 05/21/15 with non-traumatic SAH.  PTA, pt independent, lives with spouse.                  Action/Plan: Will follow for discharge planning as pt progresses.    Expected Discharge Date:                  Expected Discharge Plan:  Home/Self Care  In-House Referral:     Discharge planning Services  CM Consult  Post Acute Care Choice:    Choice offered to:     DME Arranged:    DME Agency:     HH Arranged:    HH Agency:     Status of Service:  In process, will continue to follow  Medicare Important Message Given:    Date Medicare IM Given:    Medicare IM give by:    Date Additional Medicare IM Given:    Additional Medicare Important Message give by:     If discussed at [place] of Stay Meetings, dates discussed:    Additional Comments:  Reinaldo Raddle, RN, BSN  Trauma/Neuro ICU Case Manager [phone]

## 2015-05-28 NOTE — Op Note (Signed)
DIAGNOSTIC CEREBRAL ANGIOGRAM    OPERATOR:   Dr. Consuella Lose, MD  HISTORY:   The patient is a 54 y.o. yo male admitted with sudden onset of severe HA. Initial angiogram was negative for intracranial aneurysm. He has been observed over the last week in the ICU and has remained neurologically stable with improving HA. He presents for f/u angiogram.   APPROACH:   The technical aspects of the procedure as well as its potential risks and benefits were reviewed with the patient. These risks included but were not limited bleeding, infection, allergic reaction, damage to organs/vital structures, stroke, non-diagnostic procedure, and the catastrophic outcomes of heart attack, coma, and death. With an understanding of these risks, informed consent was obtained and witnessed.    The patient was placed in the supine position on the angiography table and the skin of right groin prepped in the usual sterile fashion. The procedure was performed under local anesthesia (1%-solution of bicarbonate-bufferred Lidoacaine) and conscious sedation with Versed and fentanyl monitored by the in-suite nurse.    A 5- French sheath was introduced in the right common femoral artery using Seldinger technique.  A fluorophase sequence was used to document the sheath position.    HEPARIN: 0 Units total.   CONTRAST AGENT: 70cc, Omnipaque 300   FLUOROSCOPY TIME: 3.0 combined AP and lateral minutes    CATHETER(S) AND WIRE(S):    5-French JB-1 glidecatheter   0.035" glidewire    VESSELS CATHETERIZED:   Right common carotid   Right internal carotid Left common carotid Left common carotid   Right vertebral   Left vertebral   Right common femoral  VESSELS STUDIED:   Right common carotid, neck Right internal carotid, head Right vertebral Left common carotid, neck Left internal carotid, head Left vertebral Right femoral  PROCEDURAL NARRATIVE:   A 5-Fr JB-1 terumo glide catheter was advanced over a 0.035  glidewire into the aortic arch. The above vessels were then sequentially catheterized and cervical/cerebral angiograms taken. After review of images, the catheter was removed without incident.    INTERPRETATION:   Right common carotid: neck:   There is no significant stenosis, occlusion, aneurysm or plaque visualized on this injection.    Right internal carotid: head:   Injection reveals the presence of a widely patent ICA, M1, and A1 segments and their branches. There is no significant stenosis, occlusion, aneurysm or high flow vascular malformation visualized. There is minimal vasospasm involving primarily the supraclinoid segment of the ICA and the proximal A1 and M1. The parenchymal and venous phases are normal. The venous sinuses are widely patent.    Left common carotid: neck:   There is no significant stenosis, occlusion, aneurysm or plaque visualized on this injection.    Left internal carotid: head:   Injection reveals the presence of a widely patent ICA, A1, and M1 segments and their branches. There is no significant stenosis, occlusion, aneurysm, or high flow vascular malformation visualized. There is minimal vasospasm involving primarily the supraclinoid segment of the ICA and the proximal A1 and M1. The parenchymal and venous phases are normal. The venous sinuses are widely patent.    Left vertebral:   Injection reveals the presence of a widely patent vertebral artery. This leads to a widely patent basilar artery that terminates in bilateral P1. The basilar apex is normal. There is no significant stenosis, occlusion, aneurysm, or vascular malformation visualized. There is minimal vasospasm primarily involving the basilar trunk. The parenchymal and venous phases are normal. The venous sinuses  are widely patent.    Right vertebral:    Normal vessel. No PICA aneurysm. See basilar description above.    Right femoral:    Normal vessel. No significant atherosclerotic disease. Arterial  sheath in adequate position.   DISPOSITION:  Upon completion of the study, the femoral sheath was removed and hemostasis obtained using a 5-Fr ExoSeal closure device. Good proximal and distal lower extremity pulses were documented upon achievement of hemostasis.    The procedure was well tolerated and no early complications were observed.       The patient was transferred back to the holding area to be positioned flat in bed for 3 hours of observation.    IMPRESSION:  1. No aneurysm, AVM, or high-flow fistulas seen on this follow angiogram for subarachnoid hemorrhage. 2. Minimal, non-flow limiting diffuse vasospasm as described above.  The preliminary results of this procedure were shared with the patient and the patient's family.

## 2015-05-28 NOTE — Sedation Documentation (Signed)
Transferred to 3M11 via bed R groin site c/d/i

## 2015-05-29 ENCOUNTER — Other Ambulatory Visit (HOSPITAL_COMMUNITY): Payer: BLUE CROSS/BLUE SHIELD

## 2015-05-29 LAB — GLUCOSE, CAPILLARY: GLUCOSE-CAPILLARY: 90 mg/dL (ref 65–99)

## 2015-05-29 MED ORDER — NIMODIPINE 30 MG PO CAPS: 60.0000 mg | ORAL_CAPSULE | ORAL | Status: DC

## 2015-05-29 NOTE — Care Management Note (Signed)
Case Management Note  Patient Details  Name: Brian [name] MRN: [id number] Date of Birth: 1960/12/26  Subjective/Objective:     Pt for dc home today with wife.  He will dc home on Nimotop therapy for 14 more days.                 Action/Plan: Checked coverage for Nimotop...coverage as follows:   Per rep at [employer]:   20% coinsurance for all medications - patients cost for this medication is estimated at $1100 due to deductible not being met. Once deductible is met, co-pay is estimated at $300   Deductible is $1500, out of pocket max is $3500   Once out of pocket is met he will be covered at 100%   No auth required  Patient can use any retail EXCEPT [facility]   Pt's pharmacy, [facility], does not have Nimotop in stock.  Checked with [facility], and they do have med in stock.  Pt informed of copay information and where to have Rx filled.  He states he does not have a problem with the cost.    Expected Discharge Date:    05/29/15              Expected Discharge Plan:  Home/Self Care  In-House Referral:     Discharge planning Services  CM Consult, Medication assistance  Post Acute Care Choice:    Choice offered to:     DME Arranged:    DME Agency:     HH Arranged:    HH Agency:     Status of Service:  Completed, signed off  Medicare Important Message Given:    Date Medicare IM Given:    Medicare IM give by:    Date Additional Medicare IM Given:    Additional Medicare Important Message give by:     If discussed at [place] of Stay Meetings, dates discussed:    Additional Comments:  Reinaldo Raddle, RN, BSN  Trauma/Neuro ICU Case Manager [phone]

## 2015-05-29 NOTE — Discharge Summary (Signed)
  Physician Discharge Summary  Patient ID: Brian [name] MRN: [id number] DOB/AGE: 1961-06-06 54 y.o.  Admit date: 05/21/2015 Discharge date: 05/29/2015  Admission Diagnoses: [facility]  Discharge Diagnoses: Same Active Problems:   Subarachnoid hemorrhage [facility])   Discharged Condition: Stable  Hospital Course:  Mrs. Brian [name] is a 54 y.o. male admitted after sudden onset of HA. Initial angiogram was negative. He was monitored in the ICU, placed on nimotop for vasospasm prophylaxis. He had slow but steady improvement in his HA and no neurologic changes. Repeat angiogram done 1 week later was also negative for aneurysm. He was therefore discharged in stable condition. HA was managed well with tylenol.  Treatments: Diagnostic angiogram (x2)  Discharge Exam: Blood pressure 97/52, pulse 70, temperature 97.6 F (36.4 C), temperature source Oral, resp. rate 17, height '5\' 8"'$  (1.727 m), weight 76.4 kg (168 lb 6.9 oz), SpO2 98 %. Awake, alert, oriented Speech fluent, appropriate CN grossly intact 5/5 BUE/BLE  Disposition: Home     Medication List    TAKE these medications        atorvastatin 20 MG tablet  Commonly known as:  LIPITOR  Take 20 mg by mouth daily.     metFORMIN 500 MG tablet  Commonly known as:  GLUCOPHAGE  Take 500 mg by mouth daily with breakfast.     multivitamin tablet  Take 1 tablet by mouth daily.     niMODipine 30 MG capsule  Commonly known as:  NIMOTOP  Take 2 capsules (60 mg total) by mouth every 4 (four) hours.     ramipril 10 MG capsule  Commonly known as:  ALTACE  Take 10 mg by mouth daily.     VITAMIN B 12 PO  Take 1 tablet by mouth daily.           Follow-up Information    Follow up with [facility], Cassidey Barrales, C, MD In 3 weeks.   Specialty:  Neurosurgery   Contact information:   [address] [place] [place] [address] [phone]       Signed: Consuella Lose, Loletha Grayer 05/29/2015, 9:06 AM

## 2015-10-06 ENCOUNTER — Encounter (HOSPITAL_COMMUNITY): Payer: Self-pay

## 2015-10-06 ENCOUNTER — Emergency Department (HOSPITAL_COMMUNITY)
Admission: EM | Admit: 2015-10-06 | Discharge: 2015-10-07 | Disposition: A | Discharge status: Home / Self Care | Payer: BLUE CROSS/BLUE SHIELD | Attending: Emergency Medicine | Admitting: Emergency Medicine

## 2015-10-06 DIAGNOSIS — R51 Headache: Secondary | ICD-10-CM | POA: Diagnosis not present

## 2015-10-06 DIAGNOSIS — R42 Dizziness and giddiness: Secondary | ICD-10-CM | POA: Insufficient documentation

## 2015-10-06 DIAGNOSIS — I10 Essential (primary) hypertension: Secondary | ICD-10-CM | POA: Insufficient documentation

## 2015-10-06 DIAGNOSIS — R112 Nausea with vomiting, unspecified: Secondary | ICD-10-CM | POA: Insufficient documentation

## 2015-10-06 DIAGNOSIS — Z79899 Other long term (current) drug therapy: Secondary | ICD-10-CM | POA: Diagnosis not present

## 2015-10-06 DIAGNOSIS — J029 Acute pharyngitis, unspecified: Secondary | ICD-10-CM | POA: Diagnosis not present

## 2015-10-06 DIAGNOSIS — E119 Type 2 diabetes mellitus without complications: Secondary | ICD-10-CM | POA: Diagnosis not present

## 2015-10-06 HISTORY — DX: Nontraumatic subarachnoid hemorrhage, unspecified: I60.9

## 2015-10-06 LAB — COMPREHENSIVE METABOLIC PANEL
ALBUMIN: 4 g/dL (ref 3.5–5.0)
ALK PHOS: 69 U/L (ref 38–126)
ALT: 25 U/L (ref 17–63)
AST: 21 U/L (ref 15–41)
Anion gap: 12 (ref 5–15)
BILIRUBIN TOTAL: 0.7 mg/dL (ref 0.3–1.2)
BUN: 19 mg/dL (ref 6–20)
CO2: 23 mmol/L (ref 22–32)
CREATININE: 0.97 mg/dL (ref 0.61–1.24)
Calcium: 9.2 mg/dL (ref 8.9–10.3)
Chloride: 105 mmol/L (ref 101–111)
GFR calc Af Amer: >60 mL/min (ref >60)
GLUCOSE: 116 mg/dL — AB (ref 65–99)
Potassium: 4.1 mmol/L (ref 3.5–5.1)
Sodium: 140 mmol/L (ref 135–145)
TOTAL PROTEIN: 6.5 g/dL (ref 6.5–8.1)

## 2015-10-06 LAB — URINALYSIS, ROUTINE W REFLEX MICROSCOPIC
GLUCOSE, UA: NEGATIVE mg/dL
Hgb urine dipstick: NEGATIVE
KETONES UR: 40 mg/dL — AB
LEUKOCYTES UA: NEGATIVE
NITRITE: NEGATIVE
PH: 5 (ref 5.0–8.0)
Protein, ur: NEGATIVE mg/dL
Specific Gravity, Urine: 1.035 — ABNORMAL HIGH (ref 1.005–1.030)

## 2015-10-06 LAB — CBC
HCT: 46.4 % (ref 39.0–52.0)
Hemoglobin: 15.7 g/dL (ref 13.0–17.0)
MCH: 29.9 pg (ref 26.0–34.0)
MCHC: 33.8 g/dL (ref 30.0–36.0)
MCV: 88.4 fL (ref 78.0–100.0)
PLATELETS: 201 10*3/uL (ref 150–400)
RBC: 5.25 MIL/uL (ref 4.22–5.81)
RDW: 12.8 % (ref 11.5–15.5)
WBC: 10.3 10*3/uL (ref 4.0–10.5)

## 2015-10-06 LAB — LIPASE, BLOOD: Lipase: 25 U/L (ref 11–51)

## 2015-10-06 MED ORDER — SODIUM CHLORIDE 0.9 % IV BOLUS (SEPSIS)
1000.0000 mL | Freq: Once | INTRAVENOUS | Status: AC
Start: 2015-10-06 — End: 2015-10-07
  Administered 2015-10-07: 1000 mL via INTRAVENOUS

## 2015-10-06 MED ORDER — OXYCODONE-ACETAMINOPHEN 5-325 MG PO TABS
ORAL_TABLET | ORAL | Status: AC
  Filled 2015-10-06: qty 1

## 2015-10-06 MED ORDER — ONDANSETRON 4 MG PO TBDP
4.0000 mg | ORAL_TABLET | Freq: Once | ORAL | Status: AC | PRN
  Administered 2015-10-06: 4 mg via ORAL

## 2015-10-06 MED ORDER — ONDANSETRON 4 MG PO TBDP
ORAL_TABLET | ORAL | Status: AC
  Filled 2015-10-06: qty 1

## 2015-10-06 NOTE — ED Notes (Signed)
Onset 2:30 am vomiting x 4, nausea, headache, weak and dizziness upon entering the ED.  Pt took Zantac @ 5pm.

## 2015-10-06 NOTE — ED Provider Notes (Signed)
CSN: [id number]     Arrival date & time 10/06/15  1859 History  By signing my name below, I, Irene Pap, attest that this documentation has been prepared under the direction and in the presence of Davonna Belling, MD. Electronically Signed: Irene Pap, ED Scribe. 10/06/2015. 1:40 AM.   Chief Complaint  Patient presents with  . Vomiting  . Headache   The history is provided by the patient. No language interpreter was used.  HPI Comments: Brian [name] is a 55 y.o. male with a hx of HTN, DM, and subarachnoid hemorrhage who presents to the Emergency Department complaining of emesis onset one day ago. Pt states that at 2:30 AM, he began to vomit and since had 4 episodes. He reports that with each major episode, he would have 4-5 bouts of emesis. He reports associated nausea, headache, sore throat from vomiting, and dizziness. He took a Zantac at 5 PM to no relief. He states that he and his wife ate similar foods, but she did not show symptoms. He reports relief in the ED with taking Zofran. He denies that his current headache feels like his past subarachnoid hemorrhage, although he states that he was nauseous during the incident. He denies fever, chills, chest pain, cough, SOB, dysuria, or diarrhea.   Past Medical History  Diagnosis Date  . Hypertension   . Diabetes mellitus without complication ([place])   . Subarachnoid hemorrhage [facility])    Past Surgical History  Procedure Laterality Date  . Leg surgery  age 21     left leg, from fracture   Family History  Problem Relation Age of Onset  . Colon cancer Neg Hx   . Stomach cancer Neg Hx    Social History  Substance Use Topics  . Smoking status: Never Smoker   . Smokeless tobacco: Never Used  . Alcohol Use: 2.5 oz/week    5 Standard drinks or equivalent per week     Comment: occasionally    Review of Systems  Constitutional: Negative for fever and chills.  HENT: Positive for sore throat.   Respiratory: Negative for cough and  shortness of breath.   Cardiovascular: Negative for chest pain.  Gastrointestinal: Positive for nausea and vomiting. Negative for diarrhea.  Neurological: Positive for dizziness, light-headedness and headaches.  All other systems reviewed and are negative.  Allergies  Review of patient's allergies indicates no known allergies.  Home Medications   Prior to Admission medications   Medication Sig Start Date End Date Taking? Authorizing Provider  atorvastatin (LIPITOR) 20 MG tablet Take 20 mg by mouth daily.   Yes Historical Provider, MD  metFORMIN (GLUCOPHAGE) 500 MG tablet Take 500 mg by mouth daily with breakfast.   Yes Historical Provider, MD  naproxen sodium (ANAPROX) 220 MG tablet Take 220 mg by mouth 2 (two) times daily as needed (FOR PAIN).   Yes Historical Provider, MD  Pheniramine-PE-APAP (THERAFLU FLU & SORE THROAT) 20-10-650 MG PACK Take 1 Package by mouth daily as needed (FOR COLD).   Yes Historical Provider, MD  ramipril (ALTACE) 10 MG capsule Take 10 mg by mouth daily.   Yes Historical Provider, MD  venlafaxine (EFFEXOR) 37.5 MG tablet Take 37.5 mg by mouth daily. 09/24/15  Yes Historical Provider, MD  niMODipine (NIMOTOP) 30 MG capsule Take 2 capsules (60 mg total) by mouth every 4 (four) hours. 05/29/15   Consuella Lose, MD  ondansetron (ZOFRAN-ODT) 4 MG disintegrating tablet Take 1 tablet (4 mg total) by mouth every 8 (eight) hours as needed  for nausea or vomiting. 10/07/15   Davonna Belling, MD   BP 129/85 mmHg  Pulse 97  Temp(Src) 98 F (36.7 C) (Oral)  Resp 14  Ht '5\' 8"'$  (1.727 m)  Wt 161 lb 3 oz (73.114 kg)  BMI 24.51 kg/m2  SpO2 98% Physical Exam  Constitutional: He is oriented to person, place, and time. He appears well-developed and well-nourished.  Slightly uncomfortable  HENT:  Head: Normocephalic and atraumatic.  Eyes: EOM are normal. Pupils are equal, round, and reactive to light.  Neck: Normal range of motion. Neck supple.  Cardiovascular: Normal rate,  regular rhythm and normal heart sounds.  Exam reveals no gallop and no friction rub.   No murmur heard. Pulmonary/Chest: Effort normal and breath sounds normal. He has no wheezes.  Abdominal: Soft. There is no tenderness.  Musculoskeletal: Normal range of motion.  Neurological: He is alert and oriented to person, place, and time.  Skin: Skin is warm and dry.  Psychiatric: He has a normal mood and affect. His behavior is normal.  Nursing note and vitals reviewed.   ED Course  Procedures (including critical care time) DIAGNOSTIC STUDIES: Oxygen Saturation is 99% on RA, normal by my interpretation.    COORDINATION OF CARE: 11:05 PM-Discussed treatment plan which includes labs and IV fluids with pt at bedside and pt agreed to plan.    Labs Review Labs Reviewed  COMPREHENSIVE METABOLIC PANEL - Abnormal; Notable for the following:    Glucose, Bld 116 (*)    All other components within normal limits  URINALYSIS, ROUTINE W REFLEX MICROSCOPIC (NOT AT [facility]) - Abnormal; Notable for the following:    Specific Gravity, Urine 1.035 (*)    Bilirubin Urine SMALL (*)    Ketones, ur 40 (*)    All other components within normal limits  LIPASE, BLOOD  CBC    Imaging Review Ct Head Wo Contrast  10/07/2015  CLINICAL DATA:  Acute onset of vomiting, nausea, headache, weakness, and dizziness. EXAM: CT HEAD WITHOUT CONTRAST TECHNIQUE: Contiguous axial images were obtained from the base of the skull through the vertex without intravenous contrast. COMPARISON:  CT head 05/21/2015 and 05/22/2015 FINDINGS: Ventricles and sulci appear symmetrical. No ventricular dilatation. No mass effect or midline shift. No abnormal extra-axial fluid collections. Gray-white matter junctions are distinct. Basal cisterns are not effaced. No evidence of acute intracranial hemorrhage. No depressed skull fractures. Mild mucosal thickening in the paranasal sinuses. No acute air-fluid levels. Sclerosis in the left mastoid air cells.  No opacification of aerated mastoids. IMPRESSION: No acute intracranial abnormalities. Electronically Signed   By: Lucienne Capers M.D.   On: 10/07/2015 01:15   I have personally reviewed and evaluated these images and lab results as part of my medical decision-making.   EKG Interpretation None      MDM   Final diagnoses:  Non-intractable vomiting with nausea, vomiting of unspecified type    Patient with headache. Also nausea vomiting. Feels better after treatment. Has had recent subarachnoid hemorrhage without clear cause. CT scan done and does not show bleed. Tolerated orals feels better on be discharged home. I personally performed the services described in this documentation, which was scribed in my presence. The recorded information has been reviewed and is accurate.      Davonna Belling, MD 10/07/15 [phone]

## 2015-10-07 ENCOUNTER — Encounter (HOSPITAL_COMMUNITY): Payer: Self-pay | Admitting: Radiology

## 2015-10-07 ENCOUNTER — Emergency Department (HOSPITAL_COMMUNITY): Payer: BLUE CROSS/BLUE SHIELD

## 2015-10-07 MED ORDER — ONDANSETRON HCL 4 MG/2ML IJ SOLN
4.0000 mg | Freq: Once | INTRAMUSCULAR | Status: AC
  Administered 2015-10-07: 4 mg via INTRAVENOUS
  Filled 2015-10-07: qty 2

## 2015-10-07 MED ORDER — ONDANSETRON 4 MG PO TBDP: 4.0000 mg | ORAL_TABLET | Freq: Three times a day (TID) | ORAL | Status: DC | PRN

## 2015-10-07 NOTE — ED Notes (Signed)
Patient transported to CT 

## 2015-10-07 NOTE — Discharge Instructions (Signed)

## 2015-12-02 DIAGNOSIS — Z1211 Encounter for screening for malignant neoplasm of colon: Secondary | ICD-10-CM | POA: Diagnosis not present

## 2016-05-01 DIAGNOSIS — J0100 Acute maxillary sinusitis, unspecified: Secondary | ICD-10-CM | POA: Diagnosis not present

## 2016-07-03 DIAGNOSIS — H8113 Benign paroxysmal vertigo, bilateral: Secondary | ICD-10-CM | POA: Diagnosis not present

## 2016-07-10 DIAGNOSIS — E1165 Type 2 diabetes mellitus with hyperglycemia: Secondary | ICD-10-CM | POA: Diagnosis not present

## 2016-08-22 DIAGNOSIS — J019 Acute sinusitis, unspecified: Secondary | ICD-10-CM | POA: Diagnosis not present

## 2016-09-19 DIAGNOSIS — J069 Acute upper respiratory infection, unspecified: Secondary | ICD-10-CM | POA: Diagnosis not present

## 2016-09-22 DIAGNOSIS — J329 Chronic sinusitis, unspecified: Secondary | ICD-10-CM | POA: Diagnosis not present

## 2016-09-22 DIAGNOSIS — B9689 Other specified bacterial agents as the cause of diseases classified elsewhere: Secondary | ICD-10-CM | POA: Diagnosis not present

## 2016-09-22 DIAGNOSIS — J40 Bronchitis, not specified as acute or chronic: Secondary | ICD-10-CM | POA: Diagnosis not present

## 2016-10-07 DIAGNOSIS — J40 Bronchitis, not specified as acute or chronic: Secondary | ICD-10-CM | POA: Diagnosis not present

## 2016-10-07 DIAGNOSIS — Z1389 Encounter for screening for other disorder: Secondary | ICD-10-CM | POA: Diagnosis not present

## 2016-11-26 DIAGNOSIS — Z0000 Encounter for general adult medical examination without abnormal findings: Secondary | ICD-10-CM | POA: Diagnosis not present

## 2016-11-26 DIAGNOSIS — Z6827 Body mass index (BMI) 27.0-27.9, adult: Secondary | ICD-10-CM | POA: Diagnosis not present

## 2016-11-26 DIAGNOSIS — Z23 Encounter for immunization: Secondary | ICD-10-CM | POA: Diagnosis not present

## 2016-12-16 DIAGNOSIS — Z1211 Encounter for screening for malignant neoplasm of colon: Secondary | ICD-10-CM | POA: Diagnosis not present

## 2017-05-05 DIAGNOSIS — Z23 Encounter for immunization: Secondary | ICD-10-CM | POA: Diagnosis not present

## 2017-06-22 DIAGNOSIS — L309 Dermatitis, unspecified: Secondary | ICD-10-CM | POA: Diagnosis not present

## 2017-07-02 DIAGNOSIS — L309 Dermatitis, unspecified: Secondary | ICD-10-CM | POA: Diagnosis not present

## 2017-10-21 DIAGNOSIS — E860 Dehydration: Secondary | ICD-10-CM | POA: Diagnosis not present

## 2017-10-21 DIAGNOSIS — K529 Noninfective gastroenteritis and colitis, unspecified: Secondary | ICD-10-CM | POA: Diagnosis not present

## 2018-07-21 ENCOUNTER — Other Ambulatory Visit: Payer: Self-pay | Admitting: Family Medicine

## 2018-07-21 DIAGNOSIS — R911 Solitary pulmonary nodule: Secondary | ICD-10-CM

## 2018-07-25 ENCOUNTER — Ambulatory Visit
Admission: RE | Admit: 2018-07-25 | Discharge: 2018-07-25 | Disposition: A | Discharge status: Home / Self Care | Payer: BLUE CROSS/BLUE SHIELD | Source: Ambulatory Visit | Attending: Family Medicine | Admitting: Family Medicine

## 2018-07-25 DIAGNOSIS — R911 Solitary pulmonary nodule: Secondary | ICD-10-CM

## 2018-08-04 ENCOUNTER — Ambulatory Visit: Payer: BLUE CROSS/BLUE SHIELD | Admitting: Pulmonary Disease

## 2018-08-04 ENCOUNTER — Encounter: Payer: Self-pay | Admitting: Pulmonary Disease

## 2018-08-04 DIAGNOSIS — R918 Other nonspecific abnormal finding of lung field: Secondary | ICD-10-CM | POA: Diagnosis not present

## 2018-08-04 DIAGNOSIS — R911 Solitary pulmonary nodule: Secondary | ICD-10-CM | POA: Diagnosis not present

## 2018-08-04 NOTE — Assessment & Plan Note (Addendum)
2.8 cm mass in the right upper lobe of the lung and a 10 mm lymph gland enlarged in the chest  Schedule PET scan Schedule PFTs/breathing test  Based on above, we will decide on next step-biopsy versus surgery.  Feel that even if mediastinum is negative on PET he will need EBUS for better staging but may be a candidate for surgery if lung function is normal Some concern here for pleural involvement  Greater than 50% time of 2mins was spent in counseling and coordination of care with the patient

## 2018-08-04 NOTE — Patient Instructions (Signed)
We discussed 2.8 cm mass in the right upper lobe of the lung and a 10 mm lymph gland enlarged in the chest  Schedule PET scan Schedule PFTs/breathing test  Based on above, we will decide on next step-biopsy versus surgery

## 2018-08-04 NOTE — Progress Notes (Signed)
Subjective:    Patient ID: Brian [name], male    DOB: July 25, 1961, 58 y.o.   MRN: [id number]  HPI  Chief Complaint  Patient presents with  . Pulm Consult    Referred by Dr. Janace Litten for mass in right lung. Per patient only SX he has had was a productive cough with clear phlegm.     58 year old remote smoker presents for evaluation of abnormal CT chest. He reports chronic cough and congestion for about 4 months he tried to self medicate with over-the-counter medications and when this persisted went in to see his PCP who obtained a chest x-ray that showed a right upper lobe nodule. CT chest without contrast was obtained on 12/23 which showed mild changes of emphysema, 2.8 cm solid nodule with spiculated margins extending to the pleura of the right upper lobe and 10.5 mm precarinal lymph node which was not commented on. I personally reviewed these images with the patient and his wife Brian [name] who accompanies today.  Unfortunately no prior imaging is available for comparison.  He complains of throat irritation and a raspy voice.  He denies dyspnea on exertion, was able to work out on his elliptical for 20 minutes. He has attributed his symptoms to sinus drip, he has a history of sinus surgery at age 52  He smoked 1.5 packs/day for 20 years before he quit in 1995, less than 20 pack years  He has a history of hypertension and diabetes controlled on metformin and subarachnoid hemorrhage in 05/2015, no aneurysm was found in angiogram  Past Medical History:  Diagnosis Date  . Diabetes mellitus without complication ([place])   . Hypertension   . Subarachnoid hemorrhage [facility])      Past Surgical History:  Procedure Laterality Date  . LEG SURGERY  age 4    left leg, from fracture     Past Medical History:  Diagnosis Date  . Diabetes mellitus without complication ([place])   . Hypertension   . Subarachnoid hemorrhage [facility])    Past Surgical History:  Procedure Laterality Date  . LEG  SURGERY  age 66    left leg, from fracture    No Known Allergies  Social History   Socioeconomic History  . Marital status: Married    Spouse name: Not on file  . Number of children: Not on file  . Years of education: Not on file  . Highest education level: Not on file  Occupational History  . Not on file  Social Needs  . Financial resource strain: Not on file  . Food insecurity:    Worry: Not on file    Inability: Not on file  . Transportation needs:    Medical: Not on file    Non-medical: Not on file  Tobacco Use  . Smoking status: Never Smoker  . Smokeless tobacco: Never Used  Substance and Sexual Activity  . Alcohol use: Yes    Alcohol/week: 5.0 standard drinks    Types: 5 Standard drinks or equivalent per week    Comment: occasionally  . Drug use: No  . Sexual activity: Not on file  Lifestyle  . Physical activity:    Days per week: Not on file    Minutes per session: Not on file  . Stress: Not on file  Relationships  . Social connections:    Talks on phone: Not on file    Gets together: Not on file    Attends religious service: Not on file    Active member  of club or organization: Not on file    Attends meetings of clubs or organizations: Not on file    Relationship status: Not on file  . Intimate partner violence:    Fear of current or ex partner: Not on file    Emotionally abused: Not on file    Physically abused: Not on file    Forced sexual activity: Not on file  Other Topics Concern  . Not on file  Social History Narrative  . Not on file     Family History  Problem Relation Age of Onset  . Colon cancer Neg Hx   . Stomach cancer Neg Hx      Review of Systems  Constitutional: Negative for fever and unexpected weight change.  HENT: Negative for congestion, dental problem, ear pain, nosebleeds, postnasal drip, rhinorrhea, sinus pressure, sneezing, sore throat and trouble swallowing.   Eyes: Negative for redness and itching.  Respiratory:  Positive for cough. Negative for chest tightness, shortness of breath and wheezing.   Cardiovascular: Negative for palpitations and leg swelling.  Gastrointestinal: Negative for nausea and vomiting.  Genitourinary: Negative for dysuria.  Musculoskeletal: Negative for joint swelling.  Skin: Negative for rash.  Allergic/Immunologic: Negative.  Negative for environmental allergies, food allergies and immunocompromised state.  Neurological: Negative for headaches.  Hematological: Does not bruise/bleed easily.  Psychiatric/Behavioral: Negative for dysphoric mood. The patient is not nervous/anxious.        Objective:   Physical Exam  Gen. Pleasant, well-nourished, in no distress, normal affect ENT - no pallor,icterus, no post nasal drip Neck: No JVD, no thyromegaly, no carotid bruits Lungs: no use of accessory muscles, no dullness to percussion, clear without rales or rhonchi  Cardiovascular: Rhythm regular, heart sounds  normal, no murmurs or gallops, no peripheral edema Abdomen: soft and non-tender, no hepatosplenomegaly, BS normal. Musculoskeletal: No deformities, no cyanosis or clubbing Neuro:  alert, non focal       Assessment & Plan:

## 2018-08-11 ENCOUNTER — Ambulatory Visit (HOSPITAL_COMMUNITY)
Admission: RE | Admit: 2018-08-11 | Discharge: 2018-08-11 | Disposition: A | Discharge status: Home / Self Care | Payer: BLUE CROSS/BLUE SHIELD | Source: Ambulatory Visit | Attending: Pulmonary Disease | Admitting: Pulmonary Disease

## 2018-08-11 DIAGNOSIS — R911 Solitary pulmonary nodule: Secondary | ICD-10-CM | POA: Diagnosis present

## 2018-08-11 LAB — GLUCOSE, CAPILLARY: Glucose-Capillary: 98 mg/dL (ref 70–99)

## 2018-08-11 MED ORDER — FLUDEOXYGLUCOSE F - 18 (FDG) INJECTION
8.7000 | Freq: Once | INTRAVENOUS | Status: AC | PRN
  Administered 2018-08-11: 8.7 via INTRAVENOUS

## 2018-08-12 ENCOUNTER — Other Ambulatory Visit: Payer: Self-pay

## 2018-08-12 DIAGNOSIS — R942 Abnormal results of pulmonary function studies: Secondary | ICD-10-CM

## 2018-08-18 ENCOUNTER — Ambulatory Visit (INDEPENDENT_AMBULATORY_CARE_PROVIDER_SITE_OTHER): Payer: BLUE CROSS/BLUE SHIELD | Admitting: Pulmonary Disease

## 2018-08-18 ENCOUNTER — Telehealth: Payer: Self-pay | Admitting: Internal Medicine

## 2018-08-18 ENCOUNTER — Ambulatory Visit: Payer: BLUE CROSS/BLUE SHIELD | Admitting: Pulmonary Disease

## 2018-08-18 ENCOUNTER — Encounter: Payer: Self-pay | Admitting: Pulmonary Disease

## 2018-08-18 ENCOUNTER — Encounter: Payer: Self-pay | Admitting: Internal Medicine

## 2018-08-18 DIAGNOSIS — R911 Solitary pulmonary nodule: Secondary | ICD-10-CM

## 2018-08-18 DIAGNOSIS — R918 Other nonspecific abnormal finding of lung field: Secondary | ICD-10-CM

## 2018-08-18 LAB — PULMONARY FUNCTION TEST
DL/VA % pred: 99 %
DL/VA: 4.56 ml/min/mmHg/L
DLCO unc % pred: 85 %
DLCO unc: 26.43 ml/min/mmHg
FEF 25-75 Post: 4.11 L/sec
FEF 25-75 Pre: 2.75 L/sec
FEF2575-%Change-Post: 49 %
FEF2575-%Pred-Post: 131 %
FEF2575-%Pred-Pre: 87 %
FEV1-%Change-Post: 9 %
FEV1-%Pred-Post: 92 %
FEV1-%Pred-Pre: 84 %
FEV1-Post: 3.39 L
FEV1-Pre: 3.09 L
FEV1FVC-%Change-Post: 5 %
FEV1FVC-%Pred-Pre: 102 %
FEV6-%Change-Post: 3 %
FEV6-%Pred-Post: 88 %
FEV6-%Pred-Pre: 85 %
FEV6-Post: 4.06 L
FEV6-Pre: 3.91 L
FEV6FVC-%Change-Post: 0 %
FEV6FVC-%Pred-Post: 104 %
FEV6FVC-%Pred-Pre: 104 %
FVC-%Change-Post: 3 %
FVC-%Pred-Post: 85 %
FVC-%Pred-Pre: 82 %
FVC-Post: 4.06 L
FVC-Pre: 3.91 L
Post FEV1/FVC ratio: 84 %
Post FEV6/FVC ratio: 100 %
Pre FEV1/FVC ratio: 79 %
Pre FEV6/FVC Ratio: 100 %
RV % pred: 78 %
RV: 1.64 L
TLC % pred: 84 %
TLC: 5.72 L

## 2018-08-18 NOTE — Patient Instructions (Signed)
We discussed PET scan results. Biopsy has been scheduled.  Schedule consultation with oncology-Dr. Julien Nordmann

## 2018-08-18 NOTE — Progress Notes (Signed)
   Subjective:    Patient ID: Brian [name], male    DOB: 1961-02-02, 58 y.o.   MRN: [id number]  HPI  58 year old remote smoker for FU of abnormal CT chest. He reports chronic cough and congestion for about 4 months,chest x-ray that showed a right upper lobe nodule.  We reviewed PET and PFTs today  Significant tests/ events reviewed  CT chest without contrast 07/25/18  which showed mild changes of emphysema, 2.8 cm solid nodule with spiculated margins extending to the pleura of the right upper lobe and 10.5 mm precarinal lymph node   PET 03/6772 hypermetabolic right upper lobe nodule with hypermetabolism in mediastinal, subcarinal and bilateral supraclavicular lymph nodes  PFTs 08/2018 nml  Review of Systems neg for any significant sore throat, dysphagia, itching, sneezing, nasal congestion or excess/ purulent secretions, fever, chills, sweats, unintended wt loss, pleuritic or exertional cp, hempoptysis, orthopnea pnd or change in chronic leg swelling. Also denies presyncope, palpitations, heartburn, abdominal pain, nausea, vomiting, diarrhea or change in bowel or urinary habits, dysuria,hematuria, rash, arthralgias, visual complaints, headache, numbness weakness or ataxia.     Objective:   Physical Exam   Gen. Pleasant, well-nourished, in no distress ENT - no thrush, no pallor/icterus,no post nasal drip Neck: No JVD, no thyromegaly, no carotid bruits, no lymphadenopathy Lungs: no use of accessory muscles, no dullness to percussion, clear without rales or rhonchi  Cardiovascular: Rhythm regular, heart sounds  normal, no murmurs or gallops, no peripheral edema Musculoskeletal: No deformities, no cyanosis or clubbing         Assessment & Plan:

## 2018-08-18 NOTE — Telephone Encounter (Signed)
A new patient appt has been scheduled for the pt to see Dr. Julien Nordmann on 1/20 at 345pm. Pt aware to arrive 30 minutes early for labs.

## 2018-08-18 NOTE — Assessment & Plan Note (Addendum)
We discussed PET scan results. 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 Julien Nordmann  Discussed risks and benefits of biopsy and alternatives, I doubt EBUS needed here at this point All questions were discussed in detail  Greater than 50% time was spent in counseling and coordination of care with the patient

## 2018-08-18 NOTE — Progress Notes (Signed)
PFT done today. 

## 2018-08-19 ENCOUNTER — Telehealth: Payer: Self-pay | Admitting: *Deleted

## 2018-08-19 ENCOUNTER — Other Ambulatory Visit: Payer: Self-pay | Admitting: Medical Oncology

## 2018-08-19 DIAGNOSIS — R918 Other nonspecific abnormal finding of lung field: Secondary | ICD-10-CM

## 2018-08-19 NOTE — Telephone Encounter (Signed)
Oncology Nurse Navigator Documentation  Oncology Nurse Navigator Flowsheets 08/19/2018  Navigator Location [facility]  Navigator Encounter Type Telephone/I called to speak with Mr. Hettich.  He is coming to see Dr. Julien Nordmann on Monday but his biopsy is on Tuesday.  I called to see if he could come on Thursday to be seen.  I was unable to reach him but did leave a vm message with my name and phone number to call.   Telephone Outgoing Call  Treatment Phase Abnormal Scans  Barriers/Navigation Needs Education;Coordination of Care  Education Other  Interventions Coordination of Care;Education  Coordination of Care Other  Education Method Verbal  Acuity Level 1  Time Spent with Patient 15

## 2018-08-22 ENCOUNTER — Inpatient Hospital Stay: Payer: BLUE CROSS/BLUE SHIELD | Admitting: Internal Medicine

## 2018-08-22 ENCOUNTER — Inpatient Hospital Stay: Payer: BLUE CROSS/BLUE SHIELD

## 2018-08-22 ENCOUNTER — Telehealth: Payer: Self-pay | Admitting: *Deleted

## 2018-08-22 ENCOUNTER — Other Ambulatory Visit: Payer: Self-pay | Admitting: Radiology

## 2018-08-22 NOTE — Telephone Encounter (Signed)
Oncology Nurse Navigator Documentation  Oncology Nurse Navigator Flowsheets 08/22/2018  Navigator Location [facility]  Navigator Encounter Type Telephone  Telephone Outgoing Call/I followed up on Mr. Mata's schedule.  His biopsy is set for tomorrow so his new patient appt was cancelled for today and re-scheduled for Thursday 08/25/2018.  He verbalized understanding of appt change.   Treatment Phase Abnormal Scans  Barriers/Navigation Needs Education;Coordination of Care  Education Other  Interventions Coordination of Care;Education  Coordination of Care Appts  Education Method Verbal  Acuity Level 1  Time Spent with Patient 30

## 2018-08-23 ENCOUNTER — Ambulatory Visit (HOSPITAL_COMMUNITY)
Admission: RE | Admit: 2018-08-23 | Discharge: 2018-08-23 | Disposition: A | Discharge status: Home / Self Care | Payer: BLUE CROSS/BLUE SHIELD | Source: Ambulatory Visit | Attending: Pulmonary Disease | Admitting: Pulmonary Disease

## 2018-08-23 ENCOUNTER — Encounter (HOSPITAL_COMMUNITY): Payer: Self-pay

## 2018-08-23 DIAGNOSIS — C3491 Malignant neoplasm of unspecified part of right bronchus or lung: Secondary | ICD-10-CM

## 2018-08-23 DIAGNOSIS — R942 Abnormal results of pulmonary function studies: Secondary | ICD-10-CM | POA: Diagnosis present

## 2018-08-23 HISTORY — DX: Malignant neoplasm of unspecified part of right bronchus or lung: C34.91

## 2018-08-23 LAB — CBC
HCT: 44.9 % (ref 39.0–52.0)
Hemoglobin: 14.6 g/dL (ref 13.0–17.0)
MCH: 30 pg (ref 26.0–34.0)
MCHC: 32.5 g/dL (ref 30.0–36.0)
MCV: 92.4 fL (ref 80.0–100.0)
Platelets: 208 10*3/uL (ref 150–400)
RBC: 4.86 MIL/uL (ref 4.22–5.81)
RDW: 13.1 % (ref 11.5–15.5)
WBC: 11 10*3/uL — ABNORMAL HIGH (ref 4.0–10.5)
nRBC: 0 % (ref 0.0–0.2)

## 2018-08-23 LAB — GLUCOSE, CAPILLARY
Glucose-Capillary: 86 mg/dL (ref 70–99)
Glucose-Capillary: 77 mg/dL (ref 70–99)

## 2018-08-23 LAB — PROTIME-INR
INR: 1.01
Prothrombin Time: 13.2 seconds (ref 11.4–15.2)

## 2018-08-23 MED ORDER — MIDAZOLAM HCL 2 MG/2ML IJ SOLN
INTRAMUSCULAR | Status: AC
  Filled 2018-08-23: qty 2

## 2018-08-23 MED ORDER — HYDROCODONE-ACETAMINOPHEN 5-325 MG PO TABS: 1.0000 | ORAL_TABLET | ORAL | Status: DC | PRN

## 2018-08-23 MED ORDER — MIDAZOLAM HCL 2 MG/2ML IJ SOLN
INTRAMUSCULAR | Status: AC | PRN
  Administered 2018-08-23: 1 mg via INTRAVENOUS
  Administered 2018-08-23 (×2): 0.5 mg via INTRAVENOUS

## 2018-08-23 MED ORDER — SODIUM CHLORIDE 0.9 % IV SOLN: INTRAVENOUS | Status: DC

## 2018-08-23 MED ORDER — FENTANYL CITRATE (PF) 100 MCG/2ML IJ SOLN
INTRAMUSCULAR | Status: AC
  Filled 2018-08-23: qty 2

## 2018-08-23 MED ORDER — SODIUM CHLORIDE 0.9 % IV SOLN
INTRAVENOUS | Status: AC | PRN
  Administered 2018-08-23: 10 mL/h via INTRAVENOUS

## 2018-08-23 MED ORDER — LIDOCAINE HCL (PF) 1 % IJ SOLN
INTRAMUSCULAR | Status: AC
  Filled 2018-08-23: qty 30

## 2018-08-23 MED ORDER — FENTANYL CITRATE (PF) 100 MCG/2ML IJ SOLN
INTRAMUSCULAR | Status: AC | PRN
  Administered 2018-08-23 (×2): 25 ug via INTRAVENOUS
  Administered 2018-08-23: 50 ug via INTRAVENOUS

## 2018-08-23 NOTE — Procedures (Signed)
  Procedure: [place] core L supraclav LAN  18g x4 in saline EBL:   minimal Complications:  none immediate  See full dictation in [facility].  Dillard Cannon MD Main # [phone] Pager  [phone]

## 2018-08-23 NOTE — H&P (Signed)
Chief Complaint: Patient was seen in consultation today for supraclavicular lymph node biopsy.  Referring Physician(s): [name]  Supervising Physician: Arne Cleveland  Patient Status: [facility] - Out-pt  History of Present Illness: Brian [name] is a 58 y.o. male with a past medical history significant for SAH, HTN, DM and recently discovered right lung nodule followed by Dr. Elsworth Soho who presents today for supraclavicular lymph node biopsy. Mr. Pitta reports experiencing a chronic productive cough that began around August of 2019 that did not respond to OTC medications. He went to his PCP (Dr. Janace Litten) for further evaluation and CXR was performed which showed a right upper lobe nodule. He was then sent for a CT chest without contrast on 07/26/09 which showed right upper lobe pulmonary nodule with a maximum diameter of 2.8 cm - suspicious for primary bronchogenic carcinoma. He was referred to Dr. Elsworth Soho and was initially seen on 08/04/18 where a PET scan was ordered. Patient underwent PET on 08/11/18 which showed a hypermetabolic 2.9 cm right upper lobe nodule - consistent with primary bronchogenic carcinoma; hypermetabolic right hilar and ipsilateral mediastinal lymphadenopathy - consistent with metastatic disease; hypermetabolic bilateral supraclavicular lymphadenopathy - consistent with metastatic disease; no evidence of abdominal or pelvic metastatic disease. A biopsy of the supraclavicular lymph node has been requested for further treatment planning.  Patient reports he feels fine, has not complaints - cough has actually improved a little. He states understanding of the procedure and wishes to proceed.  Past Medical History:  Diagnosis Date  . Diabetes mellitus without complication ([place])   . Hypertension   . Subarachnoid hemorrhage [facility] [place])     Past Surgical History:  Procedure Laterality Date  . LEG SURGERY  age 45    left leg, from fracture    Allergies: Patient has no known  allergies.  Medications: Prior to Admission medications   Medication Sig Start Date End Date Taking? Authorizing Provider  amLODipine (NORVASC) 5 MG tablet Take by mouth. 07/20/18  Yes [provider]  atorvastatin (LIPITOR) 20 MG tablet Take by mouth. 12/10/17  Yes [provider]  metFORMIN (GLUCOPHAGE) 500 MG tablet Take by mouth. 12/10/17  Yes [provider]  venlafaxine (EFFEXOR) 37.5 MG tablet TAKE 1 TABLET ONCE DAILY. 12/10/17  Yes [provider]     Family History  Problem Relation Age of Onset  . Colon cancer Neg Hx   . Stomach cancer Neg Hx     Social History   Socioeconomic History  . Marital status: Married    Spouse name: Not on file  . Number of children: Not on file  . Years of education: Not on file  . Highest education level: Not on file  Occupational History  . Not on file  Social Needs  . Financial resource strain: Not on file  . Food insecurity:    Worry: Not on file    Inability: Not on file  . Transportation needs:    Medical: Not on file    Non-medical: Not on file  Tobacco Use  . Smoking status: Never Smoker  . Smokeless tobacco: Never Used  Substance and Sexual Activity  . Alcohol use: Yes    Alcohol/week: 5.0 standard drinks    Types: 5 Standard drinks or equivalent per week    Comment: occasionally  . Drug use: No  . Sexual activity: Not on file  Lifestyle  . Physical activity:    Days per week: Not on file    Minutes per session: Not  on file  . Stress: Not on file  Relationships  . Social connections:    Talks on phone: Not on file    Gets together: Not on file    Attends religious service: Not on file    Active member of club or organization: Not on file    Attends meetings of clubs or organizations: Not on file    Relationship status: Not on file  Other Topics Concern  . Not on file  Social History Narrative  . Not on file     Review of Systems: A 12 point ROS discussed and pertinent  positives are indicated in the HPI above.  All other systems are negative.  Review of Systems  Constitutional: Negative for appetite change, chills, fatigue and fever.  Respiratory: Positive for cough (productive; chronic). Negative for shortness of breath.   Cardiovascular: Negative for chest pain and leg swelling.  Gastrointestinal: Negative for abdominal pain, blood in stool, diarrhea, nausea and vomiting.  Genitourinary: Negative for dysuria and hematuria.  Musculoskeletal: Negative for back pain.  Neurological: Negative for dizziness, syncope and headaches.  Psychiatric/Behavioral: Negative for confusion.    Vital Signs: BP (!) 133/92   Pulse 76   Temp 98.5 F (36.9 C) (Oral)   Resp 16   Ht 5\' 8"  (1.727 m)   Wt 178 lb (80.7 kg)   SpO2 97%   BMI 27.06 kg/m   Physical Exam Vitals signs reviewed.  Constitutional:      General: He is not in acute distress.    Appearance: Normal appearance. He is not ill-appearing.  HENT:     Head: Normocephalic.  Cardiovascular:     Rate and Rhythm: Normal rate and regular rhythm.  Pulmonary:     Effort: Pulmonary effort is normal.     Breath sounds: Normal breath sounds.  Abdominal:     General: There is no distension.     Palpations: Abdomen is soft.     Tenderness: There is no abdominal tenderness.  Skin:    General: Skin is warm and dry.  Neurological:     Mental Status: He is alert and oriented to person, place, and time.  Psychiatric:        Mood and Affect: Mood normal.        Behavior: Behavior normal.        Thought Content: Thought content normal.        Judgment: Judgment normal.      MD Evaluation Airway: WNL Heart: WNL Abdomen: WNL Chest/ Lungs: WNL ASA  Classification: 2 Mallampati/Airway Score: Two   Imaging: Ct Chest Wo Contrast  Result Date: 07/26/2018 CLINICAL DATA:  Evaluate pulmonary nodule EXAM: CT CHEST WITHOUT CONTRAST TECHNIQUE: Multidetector CT imaging of the chest was performed following  the standard protocol without IV contrast. COMPARISON:  None FINDINGS: Cardiovascular: Normal heart size. No pericardial effusion. Aortic atherosclerosis identified. Calcification in the LAD coronary artery noted. Mediastinum/Nodes: Normal appearance of the thyroid gland. The trachea appears patent and is midline. Normal appearance of the esophagus. No mediastinal adenopathy. Hilar structures are suboptimally evaluated due to lack of IV contrast material. No obvious hilar adenopathy identified. Lungs/Pleura: Mild change of emphysema. Within the lateral basilar portion of the right upper lobe is a 2.8 cm solid nodule with spiculated margins extending to the pleura. Highly suspicious for primary bronchogenic carcinoma. Upper Abdomen: No acute abnormality. Musculoskeletal: No chest wall mass or suspicious bone lesions identified. IMPRESSION: 1. Right upper lobe pulmonary nodule is identified with a maximum  diameter of 2.8 cm. Suspicious for primary bronchogenic carcinoma. Further evaluation with PET-CT is advised. 2. Aortic Atherosclerosis (ICD10-I70.0) and Emphysema (ICD10-J43.9). 3. Lad coronary artery atherosclerotic calcifications. Electronically Signed   By: Kerby Moors M.D.   On: 07/26/2018 08:59   Nm Pet Image Initial (pi) Skull Base To Thigh  Result Date: 08/11/2018 CLINICAL DATA:  Initial treatment strategy for right upper lobe pulmonary nodule. EXAM: NUCLEAR MEDICINE PET SKULL BASE TO THIGH TECHNIQUE: 8.7 mCi F-18 FDG was injected intravenously. Full-ring PET imaging was performed from the skull base to thigh after the radiotracer. CT data was obtained and used for attenuation correction and anatomic localization. Fasting blood glucose: 98 mg/dl COMPARISON:  Chest CT on 07/25/2018 FINDINGS: (Background mediastinal blood pool activity: SUV max = 2.8) NECK: 1.5 cm hypermetabolic left supraclavicular lymph node with SUV max of 34.5. Several right supraclavicular lymph nodes are seen, largest measuring 11  mm, with SUV max of 21.4. Incidental CT findings:  None. CHEST: 2.9 cm microlobulated nodule in the right upper lobe is hypermetabolic, with SUV max of 13.6. Mild hypermetabolic lymphadenopathy in the right hilum has SUV max of 13.1. Hypermetabolic mediastinal lymphadenopathy is seen in right paratracheal, precarinal, and subcarinal regions, with index lymph node in the right paratracheal region measuring 12 mm with SUV max of 15.9. No evidence of pleural effusion. Incidental CT findings:  None. ABDOMEN/PELVIS: No abnormal hypermetabolic activity within the liver, pancreas, adrenal glands, or spleen. No hypermetabolic lymph nodes in the abdomen or pelvis. Incidental CT findings:  None. SKELETON: No focal hypermetabolic bone lesions to suggest skeletal metastasis. Incidental CT findings:  None. IMPRESSION: Hypermetabolic 2.9 cm right upper lobe nodule, consistent with primary bronchogenic carcinoma. Hypermetabolic right hilar and ipsilateral mediastinal lymphadenopathy, consistent with metastatic disease. Hypermetabolic bilateral supraclavicular lymphadenopathy, consistent with metastatic disease. No evidence of abdominal or pelvic metastatic disease. Electronically Signed   By: Earle Gell M.D.   On: 08/11/2018 09:19    Labs:  CBC: Recent Labs    08/23/18 0601  WBC 11.0*  HGB 14.6  HCT 44.9  PLT 208    COAGS: Recent Labs    08/23/18 0601  INR 1.01    BMP: No results for input(s): NA, K, CL, CO2, GLUCOSE, BUN, CALCIUM, CREATININE, GFRNONAA, GFRAA in the last 8760 hours.  Invalid input(s): CMP  LIVER FUNCTION TESTS: No results for input(s): BILITOT, AST, ALT, ALKPHOS, PROT, ALBUMIN in the last 8760 hours.  TUMOR MARKERS: No results for input(s): AFPTM, CEA, CA199, CHROMGRNA in the last 8760 hours.  Assessment and Plan:  Patient with recently diagnosed right upper lobe pulmonary nodule followed by Dr. Elsworth Soho - recent PET shows hypermetabolic activity in this nodule as well as right  hilar and ipsilateral mediastinal nodes and bilateral supraclavicular nodes. Request has been made for biopsy of the left supraclavicular lymph node today for further evaluation and treatment planning.  Patient has been NPO since 9 pm last night aside from a sip of water with his morning medications, he does not take blood thinning medications. Afebrile, WBC 11.0, hgb 14.6, plt 208, INR 1.01.  Risks and benefits discussed with the patient including, but not limited to bleeding, infection, damage to adjacent structures or low yield requiring additional tests.  All of the patient's questions were answered, patient is agreeable to proceed.  Consent signed and in chart.  Thank you for this interesting consult.  I greatly enjoyed meeting Estle Huguley and look forward to participating in their care.  A copy of this report  was sent to the requesting provider on this date.  Electronically Signed: Joaquim Nam, PA-C 08/23/2018, 7:36 AM   I spent a total of  30 Minutes  in face to face in clinical consultation, greater than 50% of which was counseling/coordinating care for supraclavicular lymph node biopsy.

## 2018-08-23 NOTE — Discharge Instructions (Addendum)
Needle Biopsy, Care After °This sheet gives you information about how to care for yourself after your procedure. Your health care provider may also give you more specific instructions. If you have problems or questions, contact your health care provider. °What can I expect after the procedure? °After the procedure, it is common to have soreness, bruising, or mild pain at the puncture site. This should go away in a few days. °Follow these instructions at home: °Needle insertion site care ° °· Wash your hands with soap and water before you change your bandage (dressing). If you cannot use soap and water, use hand sanitizer. °· Follow instructions from your health care provider about how to take care of your puncture site. This includes: °? When and how to change your dressing. °? When to remove your dressing. °· Check your puncture site every day for signs of infection. Check for: °? Redness, swelling, or pain. °? Fluid or blood. °? Pus or a bad smell. °? Warmth. °General instructions °· Return to your normal activities as told by your health care provider. Ask your health care provider what activities are safe for you. °· Do not take baths, swim, or use a hot tub until your health care provider approves. Ask your health care provider if you may take showers. You may only be allowed to take sponge baths. °· Take over-the-counter and prescription medicines only as told by your health care provider. °· Keep all follow-up visits as told by your health care provider. This is important. °Contact a health care provider if: °· You have a fever. °· You have redness, swelling, or pain at the puncture site that lasts longer than a few days. °· You have fluid, blood, or pus coming from your puncture site. °· Your puncture site feels warm to the touch. °Get help right away if: °· You have severe bleeding from the puncture site. °Summary °· After the procedure, it is common to have soreness, bruising, or mild pain at the puncture  site. This should go away in a few days. °· Check your puncture site every day for signs of infection, such as redness, swelling, or pain. °· Get help right away if you have severe bleeding from your puncture site. °This information is not intended to replace advice given to you by your health care provider. Make sure you discuss any questions you have with your health care provider. °Document Released: 12/04/2014 Document Revised: 08/02/2017 Document Reviewed: 08/02/2017 °Elsevier Interactive Patient Education © 2019 [employer] °Moderate Conscious Sedation, Adult, Care After °These instructions provide you with information about caring for yourself after your procedure. Your health care provider may also give you more specific instructions. Your treatment has been planned according to current medical practices, but problems sometimes occur. Call your health care provider if you have any problems or questions after your procedure. °What can I expect after the procedure? °After your procedure, it is common: °· To feel sleepy for several hours. °· To feel clumsy and have poor balance for several hours. °· To have poor judgment for several hours. °· To vomit if you eat too soon. °Follow these instructions at home: °For at least 24 hours after the procedure: ° °· Do not: °? Participate in activities where you could fall or become injured. °? Drive. °? Use heavy machinery. °? Drink alcohol. °? Take sleeping pills or medicines that cause drowsiness. °? Make important decisions or sign legal documents. °? Take care of children on your own. °· Rest. °Eating   and drinking °· Follow the diet recommended by your health care provider. °· If you vomit: °? Drink water, juice, or soup when you can drink without vomiting. °? Make sure you have little or no nausea before eating solid foods. °General instructions °· Have a responsible adult stay with you until you are awake and alert. °· Take over-the-counter and prescription  medicines only as told by your health care provider. °· If you smoke, do not smoke without supervision. °· Keep all follow-up visits as told by your health care provider. This is important. °Contact a health care provider if: °· You keep feeling nauseous or you keep vomiting. °· You feel light-headed. °· You develop a rash. °· You have a fever. °Get help right away if: °· You have trouble breathing. °This information is not intended to replace advice given to you by your health care provider. Make sure you discuss any questions you have with your health care provider. °Document Released: 05/10/2013 Document Revised: 12/23/2015 Document Reviewed: 11/09/2015 °Elsevier Interactive Patient Education © 2019 [employer] ° °

## 2018-08-25 ENCOUNTER — Ambulatory Visit
Admission: RE | Admit: 2018-08-25 | Discharge: 2018-08-25 | Disposition: A | Discharge status: Home / Self Care | Payer: BLUE CROSS/BLUE SHIELD | Source: Ambulatory Visit | Attending: Radiation Oncology | Admitting: Radiation Oncology

## 2018-08-25 ENCOUNTER — Encounter: Payer: Self-pay | Admitting: *Deleted

## 2018-08-25 ENCOUNTER — Encounter: Payer: Self-pay | Admitting: Internal Medicine

## 2018-08-25 ENCOUNTER — Inpatient Hospital Stay: Payer: BLUE CROSS/BLUE SHIELD | Attending: Internal Medicine

## 2018-08-25 ENCOUNTER — Inpatient Hospital Stay (HOSPITAL_BASED_OUTPATIENT_CLINIC_OR_DEPARTMENT_OTHER): Payer: BLUE CROSS/BLUE SHIELD | Admitting: Internal Medicine

## 2018-08-25 DIAGNOSIS — C3490 Malignant neoplasm of unspecified part of unspecified bronchus or lung: Secondary | ICD-10-CM

## 2018-08-25 DIAGNOSIS — Z7189 Other specified counseling: Secondary | ICD-10-CM | POA: Insufficient documentation

## 2018-08-25 DIAGNOSIS — I10 Essential (primary) hypertension: Secondary | ICD-10-CM | POA: Diagnosis not present

## 2018-08-25 DIAGNOSIS — R51 Headache: Secondary | ICD-10-CM | POA: Insufficient documentation

## 2018-08-25 DIAGNOSIS — Z7984 Long term (current) use of oral hypoglycemic drugs: Secondary | ICD-10-CM | POA: Insufficient documentation

## 2018-08-25 DIAGNOSIS — C3401 Malignant neoplasm of right main bronchus: Secondary | ICD-10-CM | POA: Diagnosis present

## 2018-08-25 DIAGNOSIS — Z5111 Encounter for antineoplastic chemotherapy: Secondary | ICD-10-CM | POA: Insufficient documentation

## 2018-08-25 DIAGNOSIS — Z87891 Personal history of nicotine dependence: Secondary | ICD-10-CM | POA: Insufficient documentation

## 2018-08-25 DIAGNOSIS — E785 Hyperlipidemia, unspecified: Secondary | ICD-10-CM

## 2018-08-25 DIAGNOSIS — Z79899 Other long term (current) drug therapy: Secondary | ICD-10-CM | POA: Diagnosis not present

## 2018-08-25 DIAGNOSIS — C778 Secondary and unspecified malignant neoplasm of lymph nodes of multiple regions: Secondary | ICD-10-CM

## 2018-08-25 DIAGNOSIS — E119 Type 2 diabetes mellitus without complications: Secondary | ICD-10-CM

## 2018-08-25 DIAGNOSIS — C3491 Malignant neoplasm of unspecified part of right bronchus or lung: Secondary | ICD-10-CM | POA: Insufficient documentation

## 2018-08-25 DIAGNOSIS — R918 Other nonspecific abnormal finding of lung field: Secondary | ICD-10-CM

## 2018-08-25 LAB — CBC WITH DIFFERENTIAL (CANCER CENTER ONLY)
Abs Immature Granulocytes: 0.05 10*3/uL (ref 0.00–0.07)
Basophils Absolute: 0.1 10*3/uL (ref 0.0–0.1)
Basophils Relative: 1 %
EOS PCT: 2 %
Eosinophils Absolute: 0.2 10*3/uL (ref 0.0–0.5)
HCT: 43.2 % (ref 39.0–52.0)
HEMOGLOBIN: 14.4 g/dL (ref 13.0–17.0)
Immature Granulocytes: 1 %
LYMPHS PCT: 14 %
Lymphs Abs: 1.5 10*3/uL (ref 0.7–4.0)
MCH: 30.6 pg (ref 26.0–34.0)
MCHC: 33.3 g/dL (ref 30.0–36.0)
MCV: 91.7 fL (ref 80.0–100.0)
Monocytes Absolute: 1.3 10*3/uL — ABNORMAL HIGH (ref 0.1–1.0)
Monocytes Relative: 12 %
NEUTROS ABS: 7.2 10*3/uL (ref 1.7–7.7)
Neutrophils Relative %: 70 %
Platelet Count: 193 10*3/uL (ref 150–400)
RBC: 4.71 MIL/uL (ref 4.22–5.81)
RDW: 13.2 % (ref 11.5–15.5)
WBC Count: 10.3 10*3/uL (ref 4.0–10.5)
nRBC: 0 % (ref 0.0–0.2)

## 2018-08-25 LAB — CMP (CANCER CENTER ONLY)
ALT: 29 U/L (ref 0–44)
AST: 17 U/L (ref 15–41)
Albumin: 3.9 g/dL (ref 3.5–5.0)
Alkaline Phosphatase: 70 U/L (ref 38–126)
Anion gap: 7 (ref 5–15)
BUN: 15 mg/dL (ref 6–20)
CO2: 28 mmol/L (ref 22–32)
CREATININE: 0.83 mg/dL (ref 0.61–1.24)
Calcium: 9.2 mg/dL (ref 8.9–10.3)
Chloride: 105 mmol/L (ref 98–111)
GFR, Est AFR Am: >60 mL/min (ref >60)
GFR, Estimated: >60 mL/min (ref >60)
Glucose, Bld: 117 mg/dL — ABNORMAL HIGH (ref 70–99)
Potassium: 3.9 mmol/L (ref 3.5–5.1)
Sodium: 140 mmol/L (ref 135–145)
Total Bilirubin: 0.3 mg/dL (ref 0.3–1.2)
Total Protein: 6.7 g/dL (ref 6.5–8.1)

## 2018-08-25 MED ORDER — PROCHLORPERAZINE MALEATE 10 MG PO TABS: 10.0000 mg | ORAL_TABLET | Freq: Four times a day (QID) | ORAL | 0 refills | Status: DC | PRN

## 2018-08-25 NOTE — Progress Notes (Signed)
START ON PATHWAY REGIMEN - Non-Small Cell Lung     Administer weekly:     Paclitaxel      Carboplatin   **Always confirm dose/schedule in your pharmacy ordering system**  Patient Characteristics: Stage III - Unresectable, PS = 0, 1 AJCC T Category: T1c Current Disease Status: No Distant Mets or Local Recurrence AJCC N Category: N3 AJCC M Category: M0 AJCC 8 Stage Grouping: IIIB Performance Status: PS = 0, 1 Intent of Therapy: Curative Intent, Discussed with Patient

## 2018-08-25 NOTE — Progress Notes (Signed)
Radiation Oncology         ([phone]) [phone] ________________________________  Multidisciplinary Thoracic Oncology Clinic [facility]) Initial Outpatient Consultation  Name: Brian [name] MRN: [id number]  Date: 08/25/2018  DOB: [birth year]  NW:[id number], Brian Camel, MD  Brian Noel, MD   REFERRING PHYSICIAN: [name], MD  DIAGNOSIS: The encounter diagnosis was Adenocarcinoma of right lung, stage 3 ([name]).    ICD-10-CM   1. Adenocarcinoma of right lung, stage 3 [facility]) C63.15    58 year old white male recently diagnosed with a stage III-B (T1c, N3, M0) non-small cell lung cancer, adenocarcinoma presented with right upper lobe lung mass in addition to mediastinal and bilateral supraclavicular lymphadenopathy diagnosed in January 2020.   HISTORY OF PRESENT ILLNESS::Brian [name] is a 58 y.o. male who presented to his PCP with chronic cough and chest congestion for about 4 months that was unimproved with OTC medications and antibiotics. He was evaluated with a chest x-ray that identified a right upper lobe nodule.  A CT scan of the chest was obtained on 07/25/18 which showed the right upper lobe pulmonary nodule with a maximum diameter of 2.8 cm.   He underwent a PET scan on 08/11/18 which showed the known right upper lobe nodule, measuring 2.9 cm, with max SUV of 13.6. There was also hypermetabolic right hilar, ipsilateral mediastinal, and bilateral supraclavicular lymphadenopathy, consistent with metastatic disease. No evidence of abdominal or pelvic metastatic disease.  Ultrasound guided core biopsy of left supraclavicular adenopathy on 08/23/18 revealed metastatic pulmonary adenocarcinoma.   The patient was referred today for presentation in the multidisciplinary conference.  Radiology studies and pathology slides were presented there for review and discussion of treatment options.  A consensus was discussed regarding potential next steps.  He reports smoking 1.5 packs per day for 20 years  before he quit in 1995.  Of note, the patient has a history of subarachnoid hemorrhage that took place a few years ago. He denies any chronic effects of this except that loud, sharp noises are more bothersome after his subarachnoid hemorrhage.  PREVIOUS RADIATION THERAPY: No  PAST MEDICAL HISTORY:  has a past medical history of Diabetes mellitus without complication ([place]), Hypertension, and Subarachnoid hemorrhage ([place]).    PAST SURGICAL HISTORY: Past Surgical History:  Procedure Laterality Date  . LEG SURGERY  age 44    left leg, from fracture    FAMILY HISTORY: family history is not on file.  SOCIAL HISTORY:  reports that he has never smoked. He has never used smokeless tobacco. He reports current alcohol use of about 5.0 standard drinks of alcohol per week. He reports that he does not use drugs. Resides in [place]. Works full-time as an Cabin crew.  ALLERGIES: Patient has no known allergies.  MEDICATIONS:  Current Outpatient Medications  Medication Sig Dispense Refill  . amLODipine (NORVASC) 5 MG tablet Take by mouth.    Marland Kitchen atorvastatin (LIPITOR) 20 MG tablet Take by mouth.    . metFORMIN (GLUCOPHAGE) 500 MG tablet Take by mouth.    . prochlorperazine (COMPAZINE) 10 MG tablet Take 1 tablet (10 mg total) by mouth every 6 (six) hours as needed for nausea or vomiting. 30 tablet 0  . venlafaxine (EFFEXOR) 37.5 MG tablet TAKE 1 TABLET ONCE DAILY.     No current facility-administered medications for this encounter.     REVIEW OF SYSTEMS:  REVIEW OF SYSTEMS: A 10+ POINT REVIEW OF SYSTEMS WAS OBTAINED including neurology, dermatology, psychiatry, cardiac, respiratory, lymph, extremities, GI, GU,  musculoskeletal, constitutional, reproductive, HEENT. All pertinent positives are noted in the HPI. All others are negative.   PHYSICAL EXAM:  Vitals with BMI 08/25/2018  Height 5\' 8"   Weight 182 lbs 3 oz  BMI 44.92  Systolic 010  Diastolic 94  Pulse 78    Respirations 18   General: Alert and oriented, in no acute distress HEENT: Head is normocephalic. Extraocular movements are intact. Oropharynx is clear. Teeth are in excellent repair. Neck: Neck is supple, no palpable cervical or supraclavicular lymphadenopathy. Heart: Regular in rate and rhythm with no murmurs, rubs, or gallops. Chest: Clear to auscultation bilaterally, with no rhonchi, wheezes, or rales. Abdomen: Soft, nontender, nondistended, with no rigidity or guarding. Extremities: No cyanosis or edema. Lymphatics: see Neck Exam Skin: No concerning lesions. Musculoskeletal: symmetric strength and muscle tone throughout. Neurologic: Cranial nerves II through XII are grossly intact. No obvious focalities. Speech is fluent. Coordination is intact. Psychiatric: Judgment and insight are intact. Affect is appropriate.   KPS = 90  100 - Normal; no complaints; no evidence of disease. 90   - Able to carry on normal activity; minor signs or symptoms of disease. 80   - Normal activity with effort; some signs or symptoms of disease. 55   - Cares for self; unable to carry on normal activity or to do active work. 60   - Requires occasional assistance, but is able to care for most of his personal needs. 50   - Requires considerable assistance and frequent medical care. 32   - Disabled; requires special care and assistance. 32   - Severely disabled; hospital admission is indicated although death not imminent. 93   - Very sick; hospital admission necessary; active supportive treatment necessary. 10   - Moribund; fatal processes progressing rapidly. 0     - Dead  Karnofsky DA, Abelmann [place], Craver LS and Burchenal [facility] [phone]) The use of the nitrogen mustards in the palliative treatment of carcinoma: with particular reference to bronchogenic carcinoma Cancer 1 [id number]  LABORATORY DATA:  Lab Results  Component Value Date   WBC 10.3 08/25/2018   HGB 14.4 08/25/2018   HCT 43.2 08/25/2018   MCV  91.7 08/25/2018   PLT 193 08/25/2018   Lab Results  Component Value Date   NA 140 08/25/2018   K 3.9 08/25/2018   CL 105 08/25/2018   CO2 28 08/25/2018   Lab Results  Component Value Date   ALT 29 08/25/2018   AST 17 08/25/2018   ALKPHOS 70 08/25/2018   BILITOT 0.3 08/25/2018    PULMONARY FUNCTION TEST:   Recent Review Flowsheet Data    Spirometry Latest Ref Rng & Units 08/18/2018   FVC-%PRED-PRE % 82   FVC-%PRED-POST % 85   FEV1-PRE L 3.09   FEV1-%PRED-PRE % 84   FEV1-POST L 3.39   FEV1-%PRED-POST % 92   DLCO [facility] ml/min/mmHg 26.43      RADIOGRAPHY: US Guided Needle Placement  Result Date: 08/23/2018 CLINICAL DATA:  Hypermetabolic 2.9 cm right upper lobe nodule, consistent with primary bronchogenic carcinoma. Hypermetabolic right hilar and ipsilateral mediastinal lymphadenopathy, consistent with metastatic disease. Hypermetabolic bilateral supraclavicular lymphadenopathy, consistent with metastatic disease.  Biopsy requested. EXAM: ULTRASOUND GUIDED CORE BIOPSY OF LEFT SUPRACLAVICULAR ADENOPATHY MEDICATIONS: Intravenous Fentanyl and Versed were administered as conscious sedation during continuous monitoring of the patient's level of consciousness and physiological / cardiorespiratory status by the radiology RN, with a total moderate sedation time of 11 minutes. PROCEDURE: The procedure, risks, benefits, and alternatives were explained  to the patient. Questions regarding the procedure were encouraged and answered. The patient understands and consents to the procedure. Survey ultrasound of the supraclavicular regions performed. The left adenopathy was localized and an appropriate skin entry site was determined and marked. The operative field was prepped with chlorhexidine in a sterile fashion, and a sterile drape was applied covering the operative field. A sterile gown and sterile gloves were used for the procedure. Local anesthesia was provided with 1% Lidocaine. Under real-time  ultrasound guidance, a 17 gauge trocar needle was advanced to the margin of the lesion. Once needle tip position was confirmed, coaxial 18-gauge core biopsy samples were obtained, submitted in saline to surgical pathology. The guide needle was removed. Postprocedure scans show no hemorrhage or other apparent complication. The patient tolerated the procedure well. COMPLICATIONS: None. FINDINGS: Bilateral supraclavicular adenopathy localized. Representative core biopsy samples obtained of the left supraclavicular adenopathy as above. IMPRESSION: 1. Technically successful ultrasound-guided core biopsy, left supraclavicular adenopathy. Electronically Signed   By: Lucrezia Europe M.D.   On: 08/23/2018 09:06   Nm Pet Image Initial (pi) Skull Base To Thigh  Result Date: 08/11/2018 CLINICAL DATA:  Initial treatment strategy for right upper lobe pulmonary nodule. EXAM: NUCLEAR MEDICINE PET SKULL BASE TO THIGH TECHNIQUE: 8.7 mCi F-18 FDG was injected intravenously. Full-ring PET imaging was performed from the skull base to thigh after the radiotracer. CT data was obtained and used for attenuation correction and anatomic localization. Fasting blood glucose: 98 mg/dl COMPARISON:  Chest CT on 07/25/2018 FINDINGS: (Background mediastinal blood pool activity: SUV max = 2.8) NECK: 1.5 cm hypermetabolic left supraclavicular lymph node with SUV max of 34.5. Several right supraclavicular lymph nodes are seen, largest measuring 11 mm, with SUV max of 21.4. Incidental CT findings:  None. CHEST: 2.9 cm microlobulated nodule in the right upper lobe is hypermetabolic, with SUV max of 13.6. Mild hypermetabolic lymphadenopathy in the right hilum has SUV max of 13.1. Hypermetabolic mediastinal lymphadenopathy is seen in right paratracheal, precarinal, and subcarinal regions, with index lymph node in the right paratracheal region measuring 12 mm with SUV max of 15.9. No evidence of pleural effusion. Incidental CT findings:  None. ABDOMEN/PELVIS:  No abnormal hypermetabolic activity within the liver, pancreas, adrenal glands, or spleen. No hypermetabolic lymph nodes in the abdomen or pelvis. Incidental CT findings:  None. SKELETON: No focal hypermetabolic bone lesions to suggest skeletal metastasis. Incidental CT findings:  None. IMPRESSION: Hypermetabolic 2.9 cm right upper lobe nodule, consistent with primary bronchogenic carcinoma. Hypermetabolic right hilar and ipsilateral mediastinal lymphadenopathy, consistent with metastatic disease. Hypermetabolic bilateral supraclavicular lymphadenopathy, consistent with metastatic disease. No evidence of abdominal or pelvic metastatic disease. Electronically Signed   By: Earle Gell M.D.   On: 08/11/2018 09:19      IMPRESSION: Stage IIIB non-small cell lung carcinoma, adenocarcinoma, primary site in the right upper lobe.  Patient will proceed with an MRI of the brain to complete his staging work-up. He will be a good candidate for a definitive course of radiation along with radiosensitizing chemotherapy.  In light of the contralateral supraclavicular lymph node involvement the patient will likely require intensity modulated radiation therapy to cover his disease without exceeding normal dose tolerances to the esophagus, spinal cord and lung area. Today, I talked to the patient and his wife about the findings and work-up thus far.  We discussed the natural history of NSCLC and general treatment, highlighting the role of external beam radiotherapy in the management.  We discussed the available radiation techniques,  and focused on the details of logistics and delivery.  We reviewed the anticipated acute and late sequelae associated with radiation in this setting.  The patient was encouraged to ask questions that I answered to the best of my ability.  A patient consent form was discussed and signed.  We retained a copy for our records.  The patient would like to proceed with radiation and will be scheduled for CT  simulation.  PLAN: Brain MRI ordered by Dr. Julien Nordmann. CT simulation scheduled for Monday January 27th at 11:00 AM. Plan to begin concurrent chemoradiation on February 3rd.     ------------------------------------------------  Blair Promise, PhD, MD  This document serves as a record of services personally performed by Gery Pray, MD. It was created on his behalf by Rae Lips, a trained medical scribe. The creation of this record is based on the scribe's personal observations and the provider's statements to them. This document has been checked and approved by the attending provider.

## 2018-08-25 NOTE — Progress Notes (Signed)
[facility] Clinical Social Work  Clinical Social Work met with patient/family at [employer] appointment to offer support and assess for psychosocial needs.  Brian [name] was accompanied by his spouse.  He has two children and two grandchildren, they do not live locally.  He reports very good social support, including his spouse, neighbors, and friends.  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  The patient feels he is coping well and has no concerns at this time.  Patient's spouse feels relieved to have a treatment plan in place and no questions/concerns at this time.  ONCBCN DISTRESS SCREENING 08/25/2018  Screening Type Initial Screening  Distress experienced in past week (1-10) 2  Spiritual/Religous concerns type Facing my mortality    Clinical Social Work briefly discussed Clinical Social Work role and [employer] support programs/services.  Clinical Social Work encouraged patient to call with any additional questions or concerns.   Maryjean Morn, MSW, LCSW, OSW-C Clinical Social Worker [facility] [phone]

## 2018-08-25 NOTE — Progress Notes (Signed)
[place] Telephone:([phone]) [phone]   [phone] Multidisciplinary thoracic oncology clinic CONSULT NOTE  REFERRING PHYSICIAN:r. Kara Mead  REASON FOR CONSULTATION:  58 years old white male recently diagnosed with lung cancer. HPI Brian [name] is a 58 y.o. male with past medical history significant for diabetes mellitus, hypertension, subarachnoid hemorrhage, dyslipidemia as well as leg fracture when he was 58 years old.  The patient has history for smoking for around 12 years but quit in 1995.  The patient has been complaining of cough for 3 months.  He also had sinus drainage and was seen by his primary care physician and started on a course of antibiotics with no improvement.  Chest x-ray was performed on July 20, 2018 and that showed suspicious lesion in the right upper lobe of the lung.  This was followed by CT scan of the chest on July 25, 2018 and that showed a 2.8 cm solid nodule with a spiculated margin within the lateral basilar portion of the right upper lobe extending to the pleura highly suspicious for primary bronchogenic carcinoma.  A PET scan was performed on August 11, 2018 and that showed 2.9 cm microlobulated nodule in the right upper lobe that is hypermetabolic with SUV max of 27.0.  There was mild hypermetabolic lymphadenopathy in the right hilum with SUV of 35.0 and hypermetabolic mediastinal lymphadenopathy seen in the right paratracheal, precarinal and subcarinal region with index lymph node in the right paratracheal region measuring 1.2 cm with SUV max of 15.9.  The scan also showed 1.5 cm hypermetabolic left supraclavicular lymph node with SUV max of 34.5 and several right supraclavicular lymph nodes the largest measured 1.1 cm with SUV max of 21.4.  The patient was seen by Dr. Elsworth Soho and he ordered ultrasound-guided core biopsy of the left supraclavicular lymph node which was performed on August 23, 2018 and the final pathology ([id number])  showed metastatic adenocarcinoma.  The immunohistochemical stains were positive for cytokeratin 7 and TTF-1-1 but negative for CK20, CDX-2, GATA-3, PACS 8, PSA, Prostein, p63 and CK 5/6.  The findings are compatible with metastatic pulmonary adenocarcinoma. Dr. Elsworth Soho kindly referred the patient to the multidisciplinary thoracic oncology clinic today for evaluation and recommendation regarding treatment of his condition. When seen today the patient is feeling fine with no concerning complaints except for sinus headache.  He denied having any chest pain, shortness of breath, cough or hemoptysis.  He denied having any nausea, vomiting, diarrhea or constipation.  He has no weight loss or night sweats. Family history significant for mother with diabetes, father is healthy brother has diabetes. The patient is married and has 2 children and 2 grandchildren.  He was accompanied today by his wife [name].  He works as an Pensions consultant.  He lives in [place].  He has a history for smoking for around 12 years and quit November 15, 1993.  He drinks alcohol socially and no history of drug abuse.  HPI  Past Medical History:  Diagnosis Date  . Diabetes mellitus without complication ([place])   . Hypertension   . Subarachnoid hemorrhage [facility])     Past Surgical History:  Procedure Laterality Date  . LEG SURGERY  age 52    left leg, from fracture    Family History  Problem Relation Age of Onset  . Colon cancer Neg Hx   . Stomach cancer Neg Hx     Social History Social History   Tobacco Use  . Smoking status: Never Smoker  .  Smokeless tobacco: Never Used  Substance Use Topics  . Alcohol use: Yes    Alcohol/week: 5.0 standard drinks    Types: 5 Standard drinks or equivalent per week    Comment: occasionally  . Drug use: No    No Known Allergies  Current Outpatient Medications  Medication Sig Dispense Refill  . amLODipine (NORVASC) 5 MG tablet Take by mouth.    Marland Kitchen atorvastatin (LIPITOR) 20 MG  tablet Take by mouth.    . metFORMIN (GLUCOPHAGE) 500 MG tablet Take by mouth.    . venlafaxine (EFFEXOR) 37.5 MG tablet TAKE 1 TABLET ONCE DAILY.     No current facility-administered medications for this visit.     Review of Systems  Constitutional: negative Eyes: negative Ears, nose, mouth, throat, and face: negative Respiratory: negative Cardiovascular: negative Gastrointestinal: negative Genitourinary:negative Integument/breast: negative Hematologic/lymphatic: negative Musculoskeletal:negative Neurological: positive for headaches Behavioral/Psych: negative Endocrine: negative Allergic/Immunologic: negative  Physical Exam  DTO:IZTIW, healthy, no distress, well nourished and well developed SKIN: skin color, texture, turgor are normal, no rashes or significant lesions HEAD: Normocephalic, No masses, lesions, tenderness or abnormalities EYES: normal, PERRLA, Conjunctiva are pink and non-injected EARS: External ears normal, Canals clear OROPHARYNX:no exudate, no erythema and lips, buccal mucosa, and tongue normal  NECK: supple, no adenopathy, no JVD LYMPH:  no palpable lymphadenopathy, no hepatosplenomegaly LUNGS: clear to auscultation , and palpation HEART: regular rate & rhythm, no murmurs and no gallops ABDOMEN:abdomen soft, non-tender, normal bowel sounds and no masses or organomegaly BACK: Back symmetric, no curvature., No CVA tenderness EXTREMITIES:no joint deformities, effusion, or inflammation, no edema  NEURO: alert & oriented x 3 with fluent speech, no focal motor/sensory deficits  PERFORMANCE STATUS: ECOG 0  LABORATORY DATA: Lab Results  Component Value Date   WBC 11.0 (H) 08/23/2018   HGB 14.6 08/23/2018   HCT 44.9 08/23/2018   MCV 92.4 08/23/2018   PLT 208 08/23/2018      Chemistry      Component Value Date/Time   NA 140 10/06/2015 1931   K 4.1 10/06/2015 1931   CL 105 10/06/2015 1931   CO2 23 10/06/2015 1931   BUN 19 10/06/2015 1931    CREATININE 0.97 10/06/2015 1931      Component Value Date/Time   CALCIUM 9.2 10/06/2015 1931   ALKPHOS 69 10/06/2015 1931   AST 21 10/06/2015 1931   ALT 25 10/06/2015 1931   BILITOT 0.7 10/06/2015 1931       RADIOGRAPHIC STUDIES: US Guided Needle Placement  Result Date: 08/23/2018 CLINICAL DATA:  Hypermetabolic 2.9 cm right upper lobe nodule, consistent with primary bronchogenic carcinoma. Hypermetabolic right hilar and ipsilateral mediastinal lymphadenopathy, consistent with metastatic disease. Hypermetabolic bilateral supraclavicular lymphadenopathy, consistent with metastatic disease.  Biopsy requested. EXAM: ULTRASOUND GUIDED CORE BIOPSY OF LEFT SUPRACLAVICULAR ADENOPATHY MEDICATIONS: Intravenous Fentanyl and Versed were administered as conscious sedation during continuous monitoring of the patient's level of consciousness and physiological / cardiorespiratory status by the radiology RN, with a total moderate sedation time of 11 minutes. PROCEDURE: The procedure, risks, benefits, and alternatives were explained to the patient. Questions regarding the procedure were encouraged and answered. The patient understands and consents to the procedure. Survey ultrasound of the supraclavicular regions performed. The left adenopathy was localized and an appropriate skin entry site was determined and marked. The operative field was prepped with chlorhexidine in a sterile fashion, and a sterile drape was applied covering the operative field. A sterile gown and sterile gloves were used for the procedure. Local anesthesia was  provided with 1% Lidocaine. Under real-time ultrasound guidance, a 17 gauge trocar needle was advanced to the margin of the lesion. Once needle tip position was confirmed, coaxial 18-gauge core biopsy samples were obtained, submitted in saline to surgical pathology. The guide needle was removed. Postprocedure scans show no hemorrhage or other apparent complication. The patient tolerated the  procedure well. COMPLICATIONS: None. FINDINGS: Bilateral supraclavicular adenopathy localized. Representative core biopsy samples obtained of the left supraclavicular adenopathy as above. IMPRESSION: 1. Technically successful ultrasound-guided core biopsy, left supraclavicular adenopathy. Electronically Signed   By: Lucrezia Europe M.D.   On: 08/23/2018 09:06   Nm Pet Image Initial (pi) Skull Base To Thigh  Result Date: 08/11/2018 CLINICAL DATA:  Initial treatment strategy for right upper lobe pulmonary nodule. EXAM: NUCLEAR MEDICINE PET SKULL BASE TO THIGH TECHNIQUE: 8.7 mCi F-18 FDG was injected intravenously. Full-ring PET imaging was performed from the skull base to thigh after the radiotracer. CT data was obtained and used for attenuation correction and anatomic localization. Fasting blood glucose: 98 mg/dl COMPARISON:  Chest CT on 07/25/2018 FINDINGS: (Background mediastinal blood pool activity: SUV max = 2.8) NECK: 1.5 cm hypermetabolic left supraclavicular lymph node with SUV max of 34.5. Several right supraclavicular lymph nodes are seen, largest measuring 11 mm, with SUV max of 21.4. Incidental CT findings:  None. CHEST: 2.9 cm microlobulated nodule in the right upper lobe is hypermetabolic, with SUV max of 13.6. Mild hypermetabolic lymphadenopathy in the right hilum has SUV max of 13.1. Hypermetabolic mediastinal lymphadenopathy is seen in right paratracheal, precarinal, and subcarinal regions, with index lymph node in the right paratracheal region measuring 12 mm with SUV max of 15.9. No evidence of pleural effusion. Incidental CT findings:  None. ABDOMEN/PELVIS: No abnormal hypermetabolic activity within the liver, pancreas, adrenal glands, or spleen. No hypermetabolic lymph nodes in the abdomen or pelvis. Incidental CT findings:  None. SKELETON: No focal hypermetabolic bone lesions to suggest skeletal metastasis. Incidental CT findings:  None. IMPRESSION: Hypermetabolic 2.9 cm right upper lobe nodule,  consistent with primary bronchogenic carcinoma. Hypermetabolic right hilar and ipsilateral mediastinal lymphadenopathy, consistent with metastatic disease. Hypermetabolic bilateral supraclavicular lymphadenopathy, consistent with metastatic disease. No evidence of abdominal or pelvic metastatic disease. Electronically Signed   By: Earle Gell M.D.   On: 08/11/2018 09:19    ASSESSMENT: This is a very pleasant 58 years old white male recently diagnosed with a stage IIIB (T1c, N3, M0) non-small cell lung cancer, adenocarcinoma presented with right upper lobe lung mass in addition to mediastinal and bilateral supraclavicular lymphadenopathy diagnosed in January 2020.   PLAN: I had a lengthy discussion with the patient and his wife today about his current disease stage, prognosis and treatment options. I personally and independently reviewed the scan images and discussed the result and showed the images to the patient and his wife. I will complete the staging work-up by ordering MRI of the brain to rule out brain metastasis. I will ask the pathology department to send his tissue block to [facility] for molecular studies and PDL 1 expression. I discussed with the patient his treatment options.  If the MRI of the brain showed no concerning findings for disease metastasis, I recommended for the patient a course of concurrent chemoradiation with weekly carboplatin for AUC of 2 and paclitaxel 45 mg/M2 followed by consolidation immunotherapy as the patient has no evidence for disease progression after the induction phase. I discussed with the patient the adverse effect of the chemotherapy including but not limited to  alopecia, myelosuppression, nausea and vomiting, peripheral neuropathy, liver or renal dysfunction. He is expected to start the first cycle of this treatment on September 05, 2018. The patient will see Dr. Sondra Come from radiation oncology later today for evaluation and discussion of the radiotherapy  option. I will arrange for the patient to have a chemotherapy education class before the first dose of his treatment. I will also call his pharmacy with prescription for Compazine 10 mg p.o. every 6 hours as needed for nausea. The patient will come back for follow-up visit 1 week after the first dose of his treatment for evaluation and management of any adverse effect of his treatment. He was advised to call immediately if he has any concerning symptoms in the interval. The patient voices understanding of current disease status and treatment options and is in agreement with the current care plan. All questions were answered. The patient knows to call the clinic with any problems, questions or concerns. We can certainly see the patient much sooner if necessary.  Thank you so much for allowing me to participate in the care of Brian [name]. I will continue to follow up the patient with you and assist in his care.  I spent 55 minutes counseling the patient face to face. The total time spent in the appointment was 80 minutes.  Disclaimer: This note was dictated with voice recognition software. Similar sounding words can inadvertently be transcribed and may not be corrected upon review.   Eilleen Kempf August 25, 2018, 1:53 PM

## 2018-08-29 ENCOUNTER — Ambulatory Visit
Admission: RE | Admit: 2018-08-29 | Discharge: 2018-08-29 | Disposition: A | Discharge status: Home / Self Care | Payer: BLUE CROSS/BLUE SHIELD | Source: Ambulatory Visit | Attending: Radiation Oncology | Admitting: Radiation Oncology

## 2018-08-29 DIAGNOSIS — Z510 Encounter for antineoplastic radiation therapy: Secondary | ICD-10-CM | POA: Insufficient documentation

## 2018-08-29 DIAGNOSIS — C3491 Malignant neoplasm of unspecified part of right bronchus or lung: Secondary | ICD-10-CM | POA: Diagnosis not present

## 2018-08-29 NOTE — Progress Notes (Signed)
  Radiation Oncology         [phone] ________________________________  Name: Brian [name] MRN: [id number]  Date: 08/29/2018  DOB: [birth year]  SIMULATION AND TREATMENT PLANNING NOTE    ICD-10-CM   1. Adenocarcinoma of right lung, stage 3 (HCC) C34.91     DIAGNOSIS:  Adenocarcinoma of right lung, stage IIIB (cT1c, cN3, cM0)  NARRATIVE:  The patient was brought to the [facility].  Identity was confirmed.  All relevant records and images related to the planned course of therapy were reviewed.  The patient freely provided informed written consent to proceed with treatment after reviewing the details related to the planned course of therapy. The consent form was witnessed and verified by the simulation staff.  Then, the patient was set-up in a stable reproducible  supine position for radiation therapy.  CT images were obtained.  Surface markings were placed.  The CT images were loaded into the planning software.  Then the target and avoidance structures were contoured.  Treatment planning then occurred.  The radiation prescription was entered and confirmed.  Then, I designed and supervised the construction of a total of 2 medically necessary complex treatment devices.  I have requested : Intensity Modulated Radiotherapy (IMRT) is medically necessary for this case for the following reason:  Dose homogeneity in coverage of the contralateral supraclavicular region.  I have ordered:dos calc.  PLAN:  The patient will receive 60 Gy in 30 fractions along with radiosensitizing. Chemotherapy.   Special Treatment Procedure Note: The patient will be receiving radiosensitizing chemotherapy. Given the potential of increased toxicities related to combined therapy and the necessity for close monitoring of the patient and blood work, this constitutes a special treatment procedure.  -----------------------------------  Blair Promise, PhD, MD

## 2018-08-31 ENCOUNTER — Ambulatory Visit (HOSPITAL_COMMUNITY)
Admission: RE | Admit: 2018-08-31 | Discharge: 2018-08-31 | Disposition: A | Discharge status: Home / Self Care | Payer: BLUE CROSS/BLUE SHIELD | Source: Ambulatory Visit | Attending: Internal Medicine | Admitting: Internal Medicine

## 2018-08-31 DIAGNOSIS — C3490 Malignant neoplasm of unspecified part of unspecified bronchus or lung: Secondary | ICD-10-CM | POA: Diagnosis present

## 2018-08-31 MED ORDER — GADOBUTROL 1 MMOL/ML IV SOLN
10.0000 mL | Freq: Once | INTRAVENOUS | Status: AC | PRN
  Administered 2018-08-31: 8 mL via INTRAVENOUS

## 2018-09-01 ENCOUNTER — Encounter: Payer: Self-pay | Admitting: *Deleted

## 2018-09-01 ENCOUNTER — Inpatient Hospital Stay: Payer: BLUE CROSS/BLUE SHIELD

## 2018-09-01 NOTE — Progress Notes (Signed)
Oncology Nurse Navigator Documentation  Oncology Nurse Navigator Flowsheets 09/01/2018  Navigator Location [facility]  Navigator Encounter Type Clinic/MDC/I spoke with patient and wife at chemo education.  He wanted results of MRI brain and per Dr. Julien Nordmann I updated him.  He was thankful for the update.   Abnormal Finding Date 07/25/2018  Confirmed Diagnosis Date 08/23/2018  Multidisiplinary Clinic Date 08/25/2018  Treatment Initiated Date 08/29/2018  Patient Visit Type MedOnc  Treatment Phase Pre-Tx/Tx Discussion  Barriers/Navigation Needs Education  Education Other  Interventions Education  Education Method Verbal  Acuity Level 2  Time Spent with Patient 15

## 2018-09-02 DIAGNOSIS — Z510 Encounter for antineoplastic radiation therapy: Secondary | ICD-10-CM | POA: Diagnosis not present

## 2018-09-05 ENCOUNTER — Ambulatory Visit
Admission: RE | Admit: 2018-09-05 | Discharge: 2018-09-05 | Disposition: A | Discharge status: Home / Self Care | Payer: BLUE CROSS/BLUE SHIELD | Source: Ambulatory Visit | Attending: Radiation Oncology | Admitting: Radiation Oncology

## 2018-09-05 DIAGNOSIS — C3491 Malignant neoplasm of unspecified part of right bronchus or lung: Secondary | ICD-10-CM | POA: Diagnosis not present

## 2018-09-05 DIAGNOSIS — Z510 Encounter for antineoplastic radiation therapy: Secondary | ICD-10-CM | POA: Insufficient documentation

## 2018-09-05 NOTE — Progress Notes (Signed)
  Radiation Oncology         [phone] ________________________________  Name: Brian [name] MRN: [id number]  Date: 09/05/2018  DOB: [birth year]  Simulation Verification Note    ICD-10-CM   1. Adenocarcinoma of right lung, stage 3 (HCC) C34.91     Status: outpatient  NARRATIVE: The patient was brought to the treatment unit and placed in the planned treatment position. The clinical setup was verified. Then port films were obtained and uploaded to the radiation oncology medical record software.  The treatment beams were carefully compared against the planned radiation fields. The position location and shape of the radiation fields was reviewed. They targeted volume of tissue appears to be appropriately covered by the radiation beams. Organs at risk appear to be excluded as planned.  Based on my personal review, I approved the simulation verification. The patient's treatment will proceed as planned.  -----------------------------------  Blair Promise, PhD, MD

## 2018-09-05 NOTE — Progress Notes (Signed)
FMLA successfully faxed to [employer] at [place] at [phone] and to [facility] at [phone]. Mailed copy to patient address on file.

## 2018-09-06 ENCOUNTER — Ambulatory Visit
Admission: RE | Admit: 2018-09-06 | Discharge: 2018-09-06 | Disposition: A | Discharge status: Home / Self Care | Payer: BLUE CROSS/BLUE SHIELD | Source: Ambulatory Visit | Attending: Radiation Oncology | Admitting: Radiation Oncology

## 2018-09-06 ENCOUNTER — Inpatient Hospital Stay: Payer: BLUE CROSS/BLUE SHIELD

## 2018-09-06 VITALS — BP 127/89 | HR 87 | Temp 98.1°F | Resp 16

## 2018-09-06 DIAGNOSIS — R590 Localized enlarged lymph nodes: Secondary | ICD-10-CM | POA: Insufficient documentation

## 2018-09-06 DIAGNOSIS — C3491 Malignant neoplasm of unspecified part of right bronchus or lung: Secondary | ICD-10-CM

## 2018-09-06 DIAGNOSIS — E119 Type 2 diabetes mellitus without complications: Secondary | ICD-10-CM | POA: Insufficient documentation

## 2018-09-06 DIAGNOSIS — Z79899 Other long term (current) drug therapy: Secondary | ICD-10-CM | POA: Insufficient documentation

## 2018-09-06 DIAGNOSIS — Z5111 Encounter for antineoplastic chemotherapy: Secondary | ICD-10-CM | POA: Insufficient documentation

## 2018-09-06 DIAGNOSIS — C3411 Malignant neoplasm of upper lobe, right bronchus or lung: Secondary | ICD-10-CM

## 2018-09-06 DIAGNOSIS — J029 Acute pharyngitis, unspecified: Secondary | ICD-10-CM

## 2018-09-06 DIAGNOSIS — Z7984 Long term (current) use of oral hypoglycemic drugs: Secondary | ICD-10-CM | POA: Insufficient documentation

## 2018-09-06 DIAGNOSIS — I10 Essential (primary) hypertension: Secondary | ICD-10-CM | POA: Insufficient documentation

## 2018-09-06 DIAGNOSIS — Z510 Encounter for antineoplastic radiation therapy: Secondary | ICD-10-CM | POA: Diagnosis not present

## 2018-09-06 LAB — CBC WITH DIFFERENTIAL (CANCER CENTER ONLY)
Abs Immature Granulocytes: 0.06 10*3/uL (ref 0.00–0.07)
Basophils Absolute: 0.1 10*3/uL (ref 0.0–0.1)
Basophils Relative: 1 %
Eosinophils Absolute: 0.2 10*3/uL (ref 0.0–0.5)
Eosinophils Relative: 2 %
HCT: 42.7 % (ref 39.0–52.0)
Hemoglobin: 14.2 g/dL (ref 13.0–17.0)
Immature Granulocytes: 1 %
LYMPHS PCT: 15 %
Lymphs Abs: 1.4 10*3/uL (ref 0.7–4.0)
MCH: 30.3 pg (ref 26.0–34.0)
MCHC: 33.3 g/dL (ref 30.0–36.0)
MCV: 91 fL (ref 80.0–100.0)
MONOS PCT: 14 %
Monocytes Absolute: 1.3 10*3/uL — ABNORMAL HIGH (ref 0.1–1.0)
Neutro Abs: 6.2 10*3/uL (ref 1.7–7.7)
Neutrophils Relative %: 67 %
Platelet Count: 205 10*3/uL (ref 150–400)
RBC: 4.69 MIL/uL (ref 4.22–5.81)
RDW: 13.5 % (ref 11.5–15.5)
WBC Count: 9.2 10*3/uL (ref 4.0–10.5)
nRBC: 0 % (ref 0.0–0.2)

## 2018-09-06 LAB — CMP (CANCER CENTER ONLY)
ALT: 35 U/L (ref 0–44)
AST: 22 U/L (ref 15–41)
Albumin: 4.1 g/dL (ref 3.5–5.0)
Alkaline Phosphatase: 71 U/L (ref 38–126)
Anion gap: 7 (ref 5–15)
BUN: 12 mg/dL (ref 6–20)
CALCIUM: 9.2 mg/dL (ref 8.9–10.3)
CO2: 26 mmol/L (ref 22–32)
Chloride: 106 mmol/L (ref 98–111)
Creatinine: 0.83 mg/dL (ref 0.61–1.24)
GFR, Est AFR Am: >60 mL/min (ref >60)
GFR, Estimated: >60 mL/min (ref >60)
Glucose, Bld: 101 mg/dL — ABNORMAL HIGH (ref 70–99)
Potassium: 4.1 mmol/L (ref 3.5–5.1)
Sodium: 139 mmol/L (ref 135–145)
TOTAL PROTEIN: 7 g/dL (ref 6.5–8.1)
Total Bilirubin: 0.5 mg/dL (ref 0.3–1.2)

## 2018-09-06 MED ORDER — DIPHENHYDRAMINE HCL 50 MG/ML IJ SOLN
INTRAMUSCULAR | Status: AC
  Filled 2018-09-06: qty 1

## 2018-09-06 MED ORDER — SODIUM CHLORIDE 0.9 % IV SOLN
285.0000 mg | Freq: Once | INTRAVENOUS | Status: AC
  Administered 2018-09-06: 290 mg via INTRAVENOUS
  Filled 2018-09-06: qty 29

## 2018-09-06 MED ORDER — SODIUM CHLORIDE 0.9 % IV SOLN
20.0000 mg | Freq: Once | INTRAVENOUS | Status: AC
  Administered 2018-09-06: 20 mg via INTRAVENOUS
  Filled 2018-09-06: qty 2

## 2018-09-06 MED ORDER — SODIUM CHLORIDE 0.9 % IV SOLN
Freq: Once | INTRAVENOUS | Status: AC
  Administered 2018-09-06: 13:00:00 via INTRAVENOUS
  Filled 2018-09-06: qty 250

## 2018-09-06 MED ORDER — PALONOSETRON HCL INJECTION 0.25 MG/5ML
INTRAVENOUS | Status: AC
  Filled 2018-09-06: qty 5

## 2018-09-06 MED ORDER — SODIUM CHLORIDE 0.9 % IV SOLN
45.0000 mg/m2 | Freq: Once | INTRAVENOUS | Status: AC
  Administered 2018-09-06: 90 mg via INTRAVENOUS
  Filled 2018-09-06: qty 15

## 2018-09-06 MED ORDER — FAMOTIDINE IN NACL 20-0.9 MG/50ML-% IV SOLN
20.0000 mg | Freq: Once | INTRAVENOUS | Status: AC
  Administered 2018-09-06: 20 mg via INTRAVENOUS

## 2018-09-06 MED ORDER — PALONOSETRON HCL INJECTION 0.25 MG/5ML
0.2500 mg | Freq: Once | INTRAVENOUS | Status: AC
  Administered 2018-09-06: 0.25 mg via INTRAVENOUS

## 2018-09-06 MED ORDER — FAMOTIDINE IN NACL 20-0.9 MG/50ML-% IV SOLN
INTRAVENOUS | Status: AC
  Filled 2018-09-06: qty 50

## 2018-09-06 MED ORDER — SODIUM CHLORIDE 0.9% FLUSH
10.0000 mL | INTRAVENOUS | Status: DC | PRN
  Filled 2018-09-06: qty 10

## 2018-09-06 MED ORDER — DIPHENHYDRAMINE HCL 50 MG/ML IJ SOLN
50.0000 mg | Freq: Once | INTRAMUSCULAR | Status: AC
  Administered 2018-09-06: 50 mg via INTRAVENOUS

## 2018-09-06 NOTE — Patient Instructions (Signed)
[facility] Discharge Instructions for Patients Receiving Chemotherapy  Today you received the following chemotherapy agents: paclitaxel (Taxol) and carboplatin (Paraplatin).  To help prevent nausea and vomiting after your treatment, we encourage you to take your nausea medication as directed by your physician.    If you develop nausea and vomiting that is not controlled by your nausea medication, call the clinic.   BELOW ARE SYMPTOMS THAT SHOULD BE REPORTED IMMEDIATELY:  *FEVER GREATER THAN 100.5 F  *CHILLS WITH OR WITHOUT FEVER  NAUSEA AND VOMITING THAT IS NOT CONTROLLED WITH YOUR NAUSEA MEDICATION  *UNUSUAL SHORTNESS OF BREATH  *UNUSUAL BRUISING OR BLEEDING  TENDERNESS IN MOUTH AND THROAT WITH OR WITHOUT PRESENCE OF ULCERS  *URINARY PROBLEMS  *BOWEL PROBLEMS  UNUSUAL RASH Items with * indicate a potential emergency and should be followed up as soon as possible.  Feel free to call the clinic should you have any questions or concerns. The clinic phone number is [phone].  Please show the [facility] at check-in to the Emergency Department and triage nurse.  Paclitaxel injection What is this medicine? PACLITAXEL (PAK li TAX el) is a chemotherapy drug. It targets fast dividing cells, like cancer cells, and causes these cells to die. This medicine is used to treat ovarian cancer, breast cancer, lung cancer, Kaposi's sarcoma, and other cancers. This medicine may be used for other purposes; ask your health care provider or pharmacist if you have questions. COMMON BRAND NAME(S): Onxol, Taxol What should I tell my health care provider before I take this medicine? They need to know if you have any of these conditions: -history of irregular heartbeat -liver disease -low blood counts, like low white cell, platelet, or red cell counts -lung or breathing disease, like asthma -tingling of the fingers or toes, or other nerve disorder -an unusual or allergic  reaction to paclitaxel, alcohol, polyoxyethylated castor oil, other chemotherapy, other medicines, foods, dyes, or preservatives -pregnant or trying to get pregnant -breast-feeding How should I use this medicine? This drug is given as an infusion into a vein. It is administered in a hospital or clinic by a specially trained health care professional. Talk to your pediatrician regarding the use of this medicine in children. Special care may be needed. Overdosage: If you think you have taken too much of this medicine contact a poison control center or emergency room at once. NOTE: This medicine is only for you. Do not share this medicine with others. What if I miss a dose? It is important not to miss your dose. Call your doctor or health care professional if you are unable to keep an appointment. What may interact with this medicine? Do not take this medicine with any of the following medications: -disulfiram -metronidazole This medicine may also interact with the following medications: -antiviral medicines for hepatitis, HIV or AIDS -certain antibiotics like erythromycin and clarithromycin -certain medicines for fungal infections like ketoconazole and itraconazole -certain medicines for seizures like carbamazepine, phenobarbital, phenytoin -gemfibrozil -nefazodone -rifampin -St. John's wort This list may not describe all possible interactions. Give your health care provider a list of all the medicines, herbs, non-prescription drugs, or dietary supplements you use. Also tell them if you smoke, drink alcohol, or use illegal drugs. Some items may interact with your medicine. What should I watch for while using this medicine? Your condition will be monitored carefully while you are receiving this medicine. You will need important blood work done while you are taking this medicine. This medicine can  cause serious allergic reactions. To reduce your risk you will need to take other medicine(s)  before treatment with this medicine. If you experience allergic reactions like skin rash, itching or hives, swelling of the face, lips, or tongue, tell your doctor or health care professional right away. In some cases, you may be given additional medicines to help with side effects. Follow all directions for their use. This drug may make you feel generally unwell. This is not uncommon, as chemotherapy can affect healthy cells as well as cancer cells. Report any side effects. Continue your course of treatment even though you feel ill unless your doctor tells you to stop. Call your doctor or health care professional for advice if you get a fever, chills or sore throat, or other symptoms of a cold or flu. Do not treat yourself. This drug decreases your body's ability to fight infections. Try to avoid being around people who are sick. This medicine may increase your risk to bruise or bleed. Call your doctor or health care professional if you notice any unusual bleeding. Be careful brushing and flossing your teeth or using a toothpick because you may get an infection or bleed more easily. If you have any dental work done, tell your dentist you are receiving this medicine. Avoid taking products that contain aspirin, acetaminophen, ibuprofen, naproxen, or ketoprofen unless instructed by your doctor. These medicines may hide a fever. Do not become pregnant while taking this medicine. Women should inform their doctor if they wish to become pregnant or think they might be pregnant. There is a potential for serious side effects to an unborn child. Talk to your health care professional or pharmacist for more information. Do not breast-feed an infant while taking this medicine. Men are advised not to father a child while receiving this medicine. This product may contain alcohol. Ask your pharmacist or healthcare provider if this medicine contains alcohol. Be sure to tell all healthcare providers you are taking this  medicine. Certain medicines, like metronidazole and disulfiram, can cause an unpleasant reaction when taken with alcohol. The reaction includes flushing, headache, nausea, vomiting, sweating, and increased thirst. The reaction can last from 30 minutes to several hours. What side effects may I notice from receiving this medicine? Side effects that you should report to your doctor or health care professional as soon as possible: -allergic reactions like skin rash, itching or hives, swelling of the face, lips, or tongue -breathing problems -changes in vision -fast, irregular heartbeat -high or low blood pressure -mouth sores -pain, tingling, numbness in the hands or feet -signs of decreased platelets or bleeding - bruising, pinpoint red spots on the skin, black, tarry stools, blood in the urine -signs of decreased red blood cells - unusually weak or tired, feeling faint or lightheaded, falls -signs of infection - fever or chills, cough, sore throat, pain or difficulty passing urine -signs and symptoms of liver injury like dark yellow or brown urine; general ill feeling or flu-like symptoms; light-colored stools; loss of appetite; nausea; right upper belly pain; unusually weak or tired; yellowing of the eyes or skin -swelling of the ankles, feet, hands -unusually slow heartbeat Side effects that usually do not require medical attention (report to your doctor or health care professional if they continue or are bothersome): -diarrhea -hair loss -loss of appetite -muscle or joint pain -nausea, vomiting -pain, redness, or irritation at site where injected -tiredness This list may not describe all possible side effects. Call your doctor for medical advice about side  effects. You may report side effects to FDA at 1-800-FDA-1088. Where should I keep my medicine? This drug is given in a hospital or clinic and will not be stored at home. NOTE: This sheet is a summary. It may not cover all possible  information. If you have questions about this medicine, talk to your doctor, pharmacist, or health care provider.  2019 Elsevier/Gold Standard (2017-03-23 13:14:55)  Carboplatin injection What is this medicine? CARBOPLATIN (KAR boe pla tin) is a chemotherapy drug. It targets fast dividing cells, like cancer cells, and causes these cells to die. This medicine is used to treat ovarian cancer and many other cancers. This medicine may be used for other purposes; ask your health care provider or pharmacist if you have questions. COMMON BRAND NAME(S): Paraplatin What should I tell my health care provider before I take this medicine? They need to know if you have any of these conditions: -blood disorders -hearing problems -kidney disease -recent or ongoing radiation therapy -an unusual or allergic reaction to carboplatin, cisplatin, other chemotherapy, other medicines, foods, dyes, or preservatives -pregnant or trying to get pregnant -breast-feeding How should I use this medicine? This drug is usually given as an infusion into a vein. It is administered in a hospital or clinic by a specially trained health care professional. Talk to your pediatrician regarding the use of this medicine in children. Special care may be needed. Overdosage: If you think you have taken too much of this medicine contact a poison control center or emergency room at once. NOTE: This medicine is only for you. Do not share this medicine with others. What if I miss a dose? It is important not to miss a dose. Call your doctor or health care professional if you are unable to keep an appointment. What may interact with this medicine? -medicines for seizures -medicines to increase blood counts like filgrastim, pegfilgrastim, sargramostim -some antibiotics like amikacin, gentamicin, neomycin, streptomycin, tobramycin -vaccines Talk to your doctor or health care professional before taking any of these  medicines: -acetaminophen -aspirin -ibuprofen -ketoprofen -naproxen This list may not describe all possible interactions. Give your health care provider a list of all the medicines, herbs, non-prescription drugs, or dietary supplements you use. Also tell them if you smoke, drink alcohol, or use illegal drugs. Some items may interact with your medicine. What should I watch for while using this medicine? Your condition will be monitored carefully while you are receiving this medicine. You will need important blood work done while you are taking this medicine. This drug may make you feel generally unwell. This is not uncommon, as chemotherapy can affect healthy cells as well as cancer cells. Report any side effects. Continue your course of treatment even though you feel ill unless your doctor tells you to stop. In some cases, you may be given additional medicines to help with side effects. Follow all directions for their use. Call your doctor or health care professional for advice if you get a fever, chills or sore throat, or other symptoms of a cold or flu. Do not treat yourself. This drug decreases your body's ability to fight infections. Try to avoid being around people who are sick. This medicine may increase your risk to bruise or bleed. Call your doctor or health care professional if you notice any unusual bleeding. Be careful brushing and flossing your teeth or using a toothpick because you may get an infection or bleed more easily. If you have any dental work done, tell your dentist you are  receiving this medicine. Avoid taking products that contain aspirin, acetaminophen, ibuprofen, naproxen, or ketoprofen unless instructed by your doctor. These medicines may hide a fever. Do not become pregnant while taking this medicine. Women should inform their doctor if they wish to become pregnant or think they might be pregnant. There is a potential for serious side effects to an unborn child. Talk to  your health care professional or pharmacist for more information. Do not breast-feed an infant while taking this medicine. What side effects may I notice from receiving this medicine? Side effects that you should report to your doctor or health care professional as soon as possible: -allergic reactions like skin rash, itching or hives, swelling of the face, lips, or tongue -signs of infection - fever or chills, cough, sore throat, pain or difficulty passing urine -signs of decreased platelets or bleeding - bruising, pinpoint red spots on the skin, black, tarry stools, nosebleeds -signs of decreased red blood cells - unusually weak or tired, fainting spells, lightheadedness -breathing problems -changes in hearing -changes in vision -chest pain -high blood pressure -low blood counts - This drug may decrease the number of white blood cells, red blood cells and platelets. You may be at increased risk for infections and bleeding. -nausea and vomiting -pain, swelling, redness or irritation at the injection site -pain, tingling, numbness in the hands or feet -problems with balance, talking, walking -trouble passing urine or change in the amount of urine Side effects that usually do not require medical attention (report to your doctor or health care professional if they continue or are bothersome): -hair loss -loss of appetite -metallic taste in the mouth or changes in taste This list may not describe all possible side effects. Call your doctor for medical advice about side effects. You may report side effects to FDA at 1-800-FDA-1088. Where should I keep my medicine? This drug is given in a hospital or clinic and will not be stored at home. NOTE: This sheet is a summary. It may not cover all possible information. If you have questions about this medicine, talk to your doctor, pharmacist, or health care provider.  2019 Elsevier/Gold Standard (2007-10-25 14:38:05)

## 2018-09-07 ENCOUNTER — Telehealth: Payer: Self-pay | Admitting: *Deleted

## 2018-09-07 ENCOUNTER — Ambulatory Visit
Admission: RE | Admit: 2018-09-07 | Discharge: 2018-09-07 | Disposition: A | Discharge status: Home / Self Care | Payer: BLUE CROSS/BLUE SHIELD | Source: Ambulatory Visit | Attending: Radiation Oncology | Admitting: Radiation Oncology

## 2018-09-07 DIAGNOSIS — Z510 Encounter for antineoplastic radiation therapy: Secondary | ICD-10-CM | POA: Diagnosis not present

## 2018-09-07 NOTE — Telephone Encounter (Signed)
Called Brian [name] for chemotherapy F/U.  Patient is doing well.  Denies n/v.  "Felt tingling about two minutes left hand."  Denies further side effects or symptoms.  Bowel and bladder functioning well.  Eating and drinking well.  Instructed to drink 64 oz minimum daily or at least the day before, of and after treatment.   Denies questions or needs at this time.  Encouraged to call [phone] Mon -Fri 8:00 am - 4:30 pm or anytime as needed for symptoms, changes or events to reach Nurse Team coverage after hours.   Asked to monitor and report numbness and tingling in addition to trouble using hands to button, zip, write, type or more.

## 2018-09-07 NOTE — Telephone Encounter (Signed)
-----   Message from Teodoro Spray, RN sent at 09/06/2018  4:59 PM EST ----- Regarding: Brian [name] Pt. first time taxol/carbo Dr. Julien Nordmann patient. First time taxol/carbo. Pt tolerated treatment well. First time chemo followup call.

## 2018-09-08 ENCOUNTER — Ambulatory Visit
Admission: RE | Admit: 2018-09-08 | Discharge: 2018-09-08 | Disposition: A | Discharge status: Home / Self Care | Payer: BLUE CROSS/BLUE SHIELD | Source: Ambulatory Visit | Attending: Radiation Oncology | Admitting: Radiation Oncology

## 2018-09-08 DIAGNOSIS — Z510 Encounter for antineoplastic radiation therapy: Secondary | ICD-10-CM | POA: Diagnosis not present

## 2018-09-09 ENCOUNTER — Ambulatory Visit
Admission: RE | Admit: 2018-09-09 | Discharge: 2018-09-09 | Disposition: A | Discharge status: Home / Self Care | Payer: BLUE CROSS/BLUE SHIELD | Source: Ambulatory Visit | Attending: Radiation Oncology | Admitting: Radiation Oncology

## 2018-09-09 DIAGNOSIS — Z510 Encounter for antineoplastic radiation therapy: Secondary | ICD-10-CM | POA: Diagnosis not present

## 2018-09-12 ENCOUNTER — Encounter: Payer: Self-pay | Admitting: Physician Assistant

## 2018-09-12 ENCOUNTER — Inpatient Hospital Stay (HOSPITAL_BASED_OUTPATIENT_CLINIC_OR_DEPARTMENT_OTHER): Payer: BLUE CROSS/BLUE SHIELD | Admitting: Physician Assistant

## 2018-09-12 ENCOUNTER — Ambulatory Visit
Admission: RE | Admit: 2018-09-12 | Discharge: 2018-09-12 | Disposition: A | Discharge status: Home / Self Care | Payer: BLUE CROSS/BLUE SHIELD | Source: Ambulatory Visit | Attending: Radiation Oncology | Admitting: Radiation Oncology

## 2018-09-12 ENCOUNTER — Inpatient Hospital Stay: Payer: BLUE CROSS/BLUE SHIELD

## 2018-09-12 ENCOUNTER — Other Ambulatory Visit: Payer: BLUE CROSS/BLUE SHIELD

## 2018-09-12 ENCOUNTER — Encounter: Payer: Self-pay | Admitting: *Deleted

## 2018-09-12 VITALS — BP 116/84 | HR 95 | Temp 98.1°F | Resp 18 | Ht 68.0 in | Wt 186.5 lb

## 2018-09-12 DIAGNOSIS — C3491 Malignant neoplasm of unspecified part of right bronchus or lung: Secondary | ICD-10-CM

## 2018-09-12 DIAGNOSIS — Z79899 Other long term (current) drug therapy: Secondary | ICD-10-CM

## 2018-09-12 DIAGNOSIS — Z5111 Encounter for antineoplastic chemotherapy: Secondary | ICD-10-CM

## 2018-09-12 DIAGNOSIS — R590 Localized enlarged lymph nodes: Secondary | ICD-10-CM | POA: Diagnosis not present

## 2018-09-12 DIAGNOSIS — I10 Essential (primary) hypertension: Secondary | ICD-10-CM

## 2018-09-12 DIAGNOSIS — Z7984 Long term (current) use of oral hypoglycemic drugs: Secondary | ICD-10-CM

## 2018-09-12 DIAGNOSIS — E119 Type 2 diabetes mellitus without complications: Secondary | ICD-10-CM | POA: Diagnosis not present

## 2018-09-12 DIAGNOSIS — C3411 Malignant neoplasm of upper lobe, right bronchus or lung: Secondary | ICD-10-CM | POA: Diagnosis not present

## 2018-09-12 DIAGNOSIS — Z510 Encounter for antineoplastic radiation therapy: Secondary | ICD-10-CM | POA: Diagnosis not present

## 2018-09-12 LAB — CBC WITH DIFFERENTIAL (CANCER CENTER ONLY)
ABS IMMATURE GRANULOCYTES: 0.12 10*3/uL — AB (ref 0.00–0.07)
Basophils Absolute: 0 10*3/uL (ref 0.0–0.1)
Basophils Relative: 1 %
Eosinophils Absolute: 0.2 10*3/uL (ref 0.0–0.5)
Eosinophils Relative: 3 %
HCT: 42.5 % (ref 39.0–52.0)
Hemoglobin: 14.2 g/dL (ref 13.0–17.0)
Immature Granulocytes: 2 %
Lymphocytes Relative: 10 %
Lymphs Abs: 0.8 10*3/uL (ref 0.7–4.0)
MCH: 30.5 pg (ref 26.0–34.0)
MCHC: 33.4 g/dL (ref 30.0–36.0)
MCV: 91.2 fL (ref 80.0–100.0)
MONO ABS: 0.8 10*3/uL (ref 0.1–1.0)
MONOS PCT: 10 %
Neutro Abs: 5.8 10*3/uL (ref 1.7–7.7)
Neutrophils Relative %: 74 %
Platelet Count: 200 10*3/uL (ref 150–400)
RBC: 4.66 MIL/uL (ref 4.22–5.81)
RDW: 13.2 % (ref 11.5–15.5)
WBC Count: 7.7 10*3/uL (ref 4.0–10.5)
nRBC: 0 % (ref 0.0–0.2)

## 2018-09-12 LAB — CMP (CANCER CENTER ONLY)
ALBUMIN: 3.8 g/dL (ref 3.5–5.0)
ALT: 37 U/L (ref 0–44)
AST: 20 U/L (ref 15–41)
Alkaline Phosphatase: 66 U/L (ref 38–126)
Anion gap: 9 (ref 5–15)
BILIRUBIN TOTAL: 0.7 mg/dL (ref 0.3–1.2)
BUN: 13 mg/dL (ref 6–20)
CO2: 22 mmol/L (ref 22–32)
Calcium: 9 mg/dL (ref 8.9–10.3)
Chloride: 107 mmol/L (ref 98–111)
Creatinine: 0.8 mg/dL (ref 0.61–1.24)
GFR, Est AFR Am: >60 mL/min (ref >60)
GFR, Estimated: >60 mL/min (ref >60)
GLUCOSE: 116 mg/dL — AB (ref 70–99)
Potassium: 4.1 mmol/L (ref 3.5–5.1)
Sodium: 138 mmol/L (ref 135–145)
Total Protein: 6.7 g/dL (ref 6.5–8.1)

## 2018-09-12 MED ORDER — PALONOSETRON HCL INJECTION 0.25 MG/5ML
0.2500 mg | Freq: Once | INTRAVENOUS | Status: AC
  Administered 2018-09-12: 0.25 mg via INTRAVENOUS

## 2018-09-12 MED ORDER — PALONOSETRON HCL INJECTION 0.25 MG/5ML
INTRAVENOUS | Status: AC
  Filled 2018-09-12: qty 5

## 2018-09-12 MED ORDER — SODIUM CHLORIDE 0.9 % IV SOLN
20.0000 mg | Freq: Once | INTRAVENOUS | Status: AC
  Administered 2018-09-12: 20 mg via INTRAVENOUS
  Filled 2018-09-12: qty 2

## 2018-09-12 MED ORDER — DIPHENHYDRAMINE HCL 50 MG/ML IJ SOLN
50.0000 mg | Freq: Once | INTRAMUSCULAR | Status: AC
  Administered 2018-09-12: 50 mg via INTRAVENOUS

## 2018-09-12 MED ORDER — SODIUM CHLORIDE 0.9 % IV SOLN
Freq: Once | INTRAVENOUS | Status: AC
  Administered 2018-09-12: 10:00:00 via INTRAVENOUS
  Filled 2018-09-12: qty 250

## 2018-09-12 MED ORDER — FAMOTIDINE IN NACL 20-0.9 MG/50ML-% IV SOLN
20.0000 mg | Freq: Once | INTRAVENOUS | Status: AC
  Administered 2018-09-12: 20 mg via INTRAVENOUS

## 2018-09-12 MED ORDER — FAMOTIDINE IN NACL 20-0.9 MG/50ML-% IV SOLN
INTRAVENOUS | Status: AC
  Filled 2018-09-12: qty 50

## 2018-09-12 MED ORDER — PROCHLORPERAZINE MALEATE 10 MG PO TABS: 10.0000 mg | ORAL_TABLET | Freq: Four times a day (QID) | ORAL | 0 refills | Status: DC | PRN

## 2018-09-12 MED ORDER — DIPHENHYDRAMINE HCL 50 MG/ML IJ SOLN
INTRAMUSCULAR | Status: AC
  Filled 2018-09-12: qty 1

## 2018-09-12 MED ORDER — SODIUM CHLORIDE 0.9 % IV SOLN
45.0000 mg/m2 | Freq: Once | INTRAVENOUS | Status: AC
  Administered 2018-09-12: 90 mg via INTRAVENOUS
  Filled 2018-09-12: qty 15

## 2018-09-12 MED ORDER — SODIUM CHLORIDE 0.9 % IV SOLN
285.0000 mg | Freq: Once | INTRAVENOUS | Status: AC
  Administered 2018-09-12: 290 mg via INTRAVENOUS
  Filled 2018-09-12: qty 29

## 2018-09-12 MED ORDER — HEPARIN SOD (PORK) LOCK FLUSH 100 UNIT/ML IV SOLN
500.0000 [IU] | Freq: Once | INTRAVENOUS | Status: DC | PRN
  Filled 2018-09-12: qty 5

## 2018-09-12 MED ORDER — SODIUM CHLORIDE 0.9% FLUSH
10.0000 mL | INTRAVENOUS | Status: DC | PRN
  Filled 2018-09-12: qty 10

## 2018-09-12 NOTE — Progress Notes (Signed)
Oncology Nurse Navigator Documentation  Oncology Nurse Navigator Flowsheets 09/12/2018  Navigator Location [facility]  Navigator Encounter Type Other/completed [employer] forms and updated them on testing.  I will complete an appt for patient to get lab work today.   Barriers/Navigation Needs Coordination of Care  Interventions Coordination of Care  Coordination of Care Other  Acuity Level 2  Time Spent with Patient 15

## 2018-09-12 NOTE — Patient Instructions (Signed)
[facility] Discharge Instructions for Patients Receiving Chemotherapy  Today you received the following chemotherapy agents: paclitaxel (Taxol) and carboplatin (Paraplatin).  To help prevent nausea and vomiting after your treatment, we encourage you to take your nausea medication as directed by your physician.    If you develop nausea and vomiting that is not controlled by your nausea medication, call the clinic.   BELOW ARE SYMPTOMS THAT SHOULD BE REPORTED IMMEDIATELY:  *FEVER GREATER THAN 100.5 F  *CHILLS WITH OR WITHOUT FEVER  NAUSEA AND VOMITING THAT IS NOT CONTROLLED WITH YOUR NAUSEA MEDICATION  *UNUSUAL SHORTNESS OF BREATH  *UNUSUAL BRUISING OR BLEEDING  TENDERNESS IN MOUTH AND THROAT WITH OR WITHOUT PRESENCE OF ULCERS  *URINARY PROBLEMS  *BOWEL PROBLEMS  UNUSUAL RASH Items with * indicate a potential emergency and should be followed up as soon as possible.  Feel free to call the clinic should you have any questions or concerns. The clinic phone number is [phone].  Please show the [facility] at check-in to the Emergency Department and triage nurse.  Paclitaxel injection What is this medicine? PACLITAXEL (PAK li TAX el) is a chemotherapy drug. It targets fast dividing cells, like cancer cells, and causes these cells to die. This medicine is used to treat ovarian cancer, breast cancer, lung cancer, Kaposi's sarcoma, and other cancers. This medicine may be used for other purposes; ask your health care provider or pharmacist if you have questions. COMMON BRAND NAME(S): Onxol, Taxol What should I tell my health care provider before I take this medicine? They need to know if you have any of these conditions: -history of irregular heartbeat -liver disease -low blood counts, like low white cell, platelet, or red cell counts -lung or breathing disease, like asthma -tingling of the fingers or toes, or other nerve disorder -an unusual or allergic  reaction to paclitaxel, alcohol, polyoxyethylated castor oil, other chemotherapy, other medicines, foods, dyes, or preservatives -pregnant or trying to get pregnant -breast-feeding How should I use this medicine? This drug is given as an infusion into a vein. It is administered in a hospital or clinic by a specially trained health care professional. Talk to your pediatrician regarding the use of this medicine in children. Special care may be needed. Overdosage: If you think you have taken too much of this medicine contact a poison control center or emergency room at once. NOTE: This medicine is only for you. Do not share this medicine with others. What if I miss a dose? It is important not to miss your dose. Call your doctor or health care professional if you are unable to keep an appointment. What may interact with this medicine? Do not take this medicine with any of the following medications: -disulfiram -metronidazole This medicine may also interact with the following medications: -antiviral medicines for hepatitis, HIV or AIDS -certain antibiotics like erythromycin and clarithromycin -certain medicines for fungal infections like ketoconazole and itraconazole -certain medicines for seizures like carbamazepine, phenobarbital, phenytoin -gemfibrozil -nefazodone -rifampin -St. John's wort This list may not describe all possible interactions. Give your health care provider a list of all the medicines, herbs, non-prescription drugs, or dietary supplements you use. Also tell them if you smoke, drink alcohol, or use illegal drugs. Some items may interact with your medicine. What should I watch for while using this medicine? Your condition will be monitored carefully while you are receiving this medicine. You will need important blood work done while you are taking this medicine. This medicine can  cause serious allergic reactions. To reduce your risk you will need to take other medicine(s)  before treatment with this medicine. If you experience allergic reactions like skin rash, itching or hives, swelling of the face, lips, or tongue, tell your doctor or health care professional right away. In some cases, you may be given additional medicines to help with side effects. Follow all directions for their use. This drug may make you feel generally unwell. This is not uncommon, as chemotherapy can affect healthy cells as well as cancer cells. Report any side effects. Continue your course of treatment even though you feel ill unless your doctor tells you to stop. Call your doctor or health care professional for advice if you get a fever, chills or sore throat, or other symptoms of a cold or flu. Do not treat yourself. This drug decreases your body's ability to fight infections. Try to avoid being around people who are sick. This medicine may increase your risk to bruise or bleed. Call your doctor or health care professional if you notice any unusual bleeding. Be careful brushing and flossing your teeth or using a toothpick because you may get an infection or bleed more easily. If you have any dental work done, tell your dentist you are receiving this medicine. Avoid taking products that contain aspirin, acetaminophen, ibuprofen, naproxen, or ketoprofen unless instructed by your doctor. These medicines may hide a fever. Do not become pregnant while taking this medicine. Women should inform their doctor if they wish to become pregnant or think they might be pregnant. There is a potential for serious side effects to an unborn child. Talk to your health care professional or pharmacist for more information. Do not breast-feed an infant while taking this medicine. Men are advised not to father a child while receiving this medicine. This product may contain alcohol. Ask your pharmacist or healthcare provider if this medicine contains alcohol. Be sure to tell all healthcare providers you are taking this  medicine. Certain medicines, like metronidazole and disulfiram, can cause an unpleasant reaction when taken with alcohol. The reaction includes flushing, headache, nausea, vomiting, sweating, and increased thirst. The reaction can last from 30 minutes to several hours. What side effects may I notice from receiving this medicine? Side effects that you should report to your doctor or health care professional as soon as possible: -allergic reactions like skin rash, itching or hives, swelling of the face, lips, or tongue -breathing problems -changes in vision -fast, irregular heartbeat -high or low blood pressure -mouth sores -pain, tingling, numbness in the hands or feet -signs of decreased platelets or bleeding - bruising, pinpoint red spots on the skin, black, tarry stools, blood in the urine -signs of decreased red blood cells - unusually weak or tired, feeling faint or lightheaded, falls -signs of infection - fever or chills, cough, sore throat, pain or difficulty passing urine -signs and symptoms of liver injury like dark yellow or brown urine; general ill feeling or flu-like symptoms; light-colored stools; loss of appetite; nausea; right upper belly pain; unusually weak or tired; yellowing of the eyes or skin -swelling of the ankles, feet, hands -unusually slow heartbeat Side effects that usually do not require medical attention (report to your doctor or health care professional if they continue or are bothersome): -diarrhea -hair loss -loss of appetite -muscle or joint pain -nausea, vomiting -pain, redness, or irritation at site where injected -tiredness This list may not describe all possible side effects. Call your doctor for medical advice about side  effects. You may report side effects to FDA at 1-800-FDA-1088. Where should I keep my medicine? This drug is given in a hospital or clinic and will not be stored at home. NOTE: This sheet is a summary. It may not cover all possible  information. If you have questions about this medicine, talk to your doctor, pharmacist, or health care provider.  2019 [employer]/Gold Standard (2017-03-23 13:14:55)  Carboplatin injection What is this medicine? CARBOPLATIN (KAR boe pla tin) is a chemotherapy drug. It targets fast dividing cells, like cancer cells, and causes these cells to die. This medicine is used to treat ovarian cancer and many other cancers. This medicine may be used for other purposes; ask your health care provider or pharmacist if you have questions. COMMON BRAND NAME(S): Paraplatin What should I tell my health care provider before I take this medicine? They need to know if you have any of these conditions: -blood disorders -hearing problems -kidney disease -recent or ongoing radiation therapy -an unusual or allergic reaction to carboplatin, cisplatin, other chemotherapy, other medicines, foods, dyes, or preservatives -pregnant or trying to get pregnant -breast-feeding How should I use this medicine? This drug is usually given as an infusion into a vein. It is administered in a hospital or clinic by a specially trained health care professional. Talk to your pediatrician regarding the use of this medicine in children. Special care may be needed. Overdosage: If you think you have taken too much of this medicine contact a poison control center or emergency room at once. NOTE: This medicine is only for you. Do not share this medicine with others. What if I miss a dose? It is important not to miss a dose. Call your doctor or health care professional if you are unable to keep an appointment. What may interact with this medicine? -medicines for seizures -medicines to increase blood counts like filgrastim, pegfilgrastim, sargramostim -some antibiotics like amikacin, gentamicin, neomycin, streptomycin, tobramycin -vaccines Talk to your doctor or health care professional before taking any of these  medicines: -acetaminophen -aspirin -ibuprofen -ketoprofen -naproxen This list may not describe all possible interactions. Give your health care provider a list of all the medicines, herbs, non-prescription drugs, or dietary supplements you use. Also tell them if you smoke, drink alcohol, or use illegal drugs. Some items may interact with your medicine. What should I watch for while using this medicine? Your condition will be monitored carefully while you are receiving this medicine. You will need important blood work done while you are taking this medicine. This drug may make you feel generally unwell. This is not uncommon, as chemotherapy can affect healthy cells as well as cancer cells. Report any side effects. Continue your course of treatment even though you feel ill unless your doctor tells you to stop. In some cases, you may be given additional medicines to help with side effects. Follow all directions for their use. Call your doctor or health care professional for advice if you get a fever, chills or sore throat, or other symptoms of a cold or flu. Do not treat yourself. This drug decreases your body's ability to fight infections. Try to avoid being around people who are sick. This medicine may increase your risk to bruise or bleed. Call your doctor or health care professional if you notice any unusual bleeding. Be careful brushing and flossing your teeth or using a toothpick because you may get an infection or bleed more easily. If you have any dental work done, tell your dentist you are  receiving this medicine. Avoid taking products that contain aspirin, acetaminophen, ibuprofen, naproxen, or ketoprofen unless instructed by your doctor. These medicines may hide a fever. Do not become pregnant while taking this medicine. Women should inform their doctor if they wish to become pregnant or think they might be pregnant. There is a potential for serious side effects to an unborn child. Talk to  your health care professional or pharmacist for more information. Do not breast-feed an infant while taking this medicine. What side effects may I notice from receiving this medicine? Side effects that you should report to your doctor or health care professional as soon as possible: -allergic reactions like skin rash, itching or hives, swelling of the face, lips, or tongue -signs of infection - fever or chills, cough, sore throat, pain or difficulty passing urine -signs of decreased platelets or bleeding - bruising, pinpoint red spots on the skin, black, tarry stools, nosebleeds -signs of decreased red blood cells - unusually weak or tired, fainting spells, lightheadedness -breathing problems -changes in hearing -changes in vision -chest pain -high blood pressure -low blood counts - This drug may decrease the number of white blood cells, red blood cells and platelets. You may be at increased risk for infections and bleeding. -nausea and vomiting -pain, swelling, redness or irritation at the injection site -pain, tingling, numbness in the hands or feet -problems with balance, talking, walking -trouble passing urine or change in the amount of urine Side effects that usually do not require medical attention (report to your doctor or health care professional if they continue or are bothersome): -hair loss -loss of appetite -metallic taste in the mouth or changes in taste This list may not describe all possible side effects. Call your doctor for medical advice about side effects. You may report side effects to FDA at 1-800-FDA-1088. Where should I keep my medicine? This drug is given in a hospital or clinic and will not be stored at home. NOTE: This sheet is a summary. It may not cover all possible information. If you have questions about this medicine, talk to your doctor, pharmacist, or health care provider.  2019 [employer]vier/Gold Standard (2007-10-25 14:38:05)

## 2018-09-12 NOTE — Progress Notes (Signed)
Oncology Nurse Navigator Documentation  Oncology Nurse Navigator Flowsheets 09/12/2018  Navigator Location [facility]  Navigator Encounter Type Other/per Dr. Julien Nordmann.  I updated pathology to send foundation one and PDL 1 testing on resent pathology  Barriers/Navigation Needs Coordination of Care  Interventions Coordination of Care  Coordination of Care Other  Acuity Level 2  Time Spent with Patient 15

## 2018-09-12 NOTE — Progress Notes (Signed)
Brian [name]  [name], MD [address] [place] [address]  DIAGNOSIS: stage IIIB (T1c, N3, M0) non-small cell lung cancer, adenocarcinoma presented with right upper lobe lung mass in addition to mediastinal and bilateral supraclavicular lymphadenopathy diagnosed in January 2020. PD-L1: 10%  PRIOR THERAPY: None  CURRENT THERAPY: Concurrent chemoradiation with weekly carboplatin for AUC of 2 and paclitaxel 45 mg/M2.  Status post 1 cycle.  INTERVAL HISTORY: Brian [name] 58 y.o. male returns to the clinic today for a follow-up visit accompanied by his wife. He tolerated his first cycle of treatment well with no concerning complaints except for mild "queeziness" which was relieved with compazine. He denied having any fever, chills, night sweats, or weight loss. He denies any chest pain, shortness of breath, cough, or hemoptysis. He denies any visual changes. He is here today for evaluation before starting cycle number 2 of his treatment today.   MEDICAL HISTORY: Past Medical History:  Diagnosis Date  . Diabetes mellitus without complication ([facility])   . Hypertension   . Subarachnoid hemorrhage ([facility])     ALLERGIES:  has No Known Allergies.  MEDICATIONS:  Current Outpatient Medications  Medication Sig Dispense Refill  . amLODipine (NORVASC) 5 MG tablet Take by mouth.    Marland Kitchen atorvastatin (LIPITOR) 20 MG tablet Take by mouth.    . metFORMIN (GLUCOPHAGE) 500 MG tablet Take by mouth.    . prochlorperazine (COMPAZINE) 10 MG tablet Take 1 tablet (10 mg total) by mouth every 6 (six) hours as needed for nausea or vomiting. 30 tablet 0  . venlafaxine (EFFEXOR) 37.5 MG tablet TAKE 1 TABLET ONCE DAILY.     No current facility-administered medications for this visit.     SURGICAL HISTORY:  Past Surgical History:  Procedure Laterality Date  . LEG SURGERY  age 25    left leg, from fracture    REVIEW OF SYSTEMS:   Review of Systems   Constitutional: Negative for appetite change, chills, fatigue, fever and unexpected weight change.  HENT:   Negative for mouth sores, nosebleeds, sore throat and trouble swallowing.   Eyes: Negative for eye problems and icterus.  Respiratory: Negative for cough, hemoptysis, shortness of breath and wheezing.   Cardiovascular: Negative for chest pain and leg swelling.  Gastrointestinal: Negative for abdominal pain, constipation, diarrhea, nausea and vomiting.  Genitourinary: Negative for bladder incontinence, difficulty urinating, dysuria, frequency and hematuria.   Musculoskeletal: Negative for back pain, gait problem, neck pain and neck stiffness.  Skin: Negative for itching and rash.  Neurological: Negative for dizziness, extremity weakness, gait problem, headaches, light-headedness and seizures.  Hematological: Negative for adenopathy. Does not bruise/bleed easily.  Psychiatric/Behavioral: Negative for confusion, depression and sleep disturbance. The patient is not nervous/anxious.     PHYSICAL EXAMINATION:  Blood pressure 116/84, pulse 95, temperature 98.1 F (36.7 C), temperature source Oral, resp. rate 18, height '5\' 8"'  (1.727 m), weight 186 lb 8 oz (84.6 kg), SpO2 98 %.  ECOG PERFORMANCE STATUS: 0 - Asymptomatic  Physical Exam  Constitutional: Oriented to person, place, and time and well-developed, well-nourished, and in no distress. No distress.  HENT:  Head: Normocephalic and atraumatic.  Mouth/Throat: Oropharynx is clear and moist. No oropharyngeal exudate.  Eyes: Conjunctivae are normal. Right eye exhibits no discharge. Left eye exhibits no discharge. No scleral icterus.  Neck: Normal range of motion. Neck supple.  Cardiovascular: Normal rate, regular rhythm, normal heart sounds and intact distal pulses.   Pulmonary/Chest: Effort  normal and breath sounds normal. No respiratory distress. No wheezes. No rales.  Abdominal: Soft. Bowel sounds are normal. Exhibits no distension and  no mass. There is no tenderness.  Musculoskeletal: Normal range of motion. Exhibits no edema.  Lymphadenopathy:    No cervical adenopathy.  Neurological: Alert and oriented to person, place, and time. Exhibits normal muscle tone. Gait normal. Coordination normal.  Skin: Skin is warm and dry. No rash noted. Not diaphoretic. No erythema. No pallor.  Psychiatric: Mood, memory and judgment normal.  Vitals reviewed.  LABORATORY DATA: Lab Results  Component Value Date   WBC 7.7 09/12/2018   HGB 14.2 09/12/2018   HCT 42.5 09/12/2018   MCV 91.2 09/12/2018   PLT 200 09/12/2018      Chemistry      Component Value Date/Time   NA 138 09/12/2018 0835   K 4.1 09/12/2018 0835   CL 107 09/12/2018 0835   CO2 22 09/12/2018 0835   BUN 13 09/12/2018 0835   CREATININE 0.80 09/12/2018 0835      Component Value Date/Time   CALCIUM 9.0 09/12/2018 0835   ALKPHOS 66 09/12/2018 0835   AST 20 09/12/2018 0835   ALT 37 09/12/2018 0835   BILITOT 0.7 09/12/2018 0835       RADIOGRAPHIC STUDIES:  Mr Jeri Cos XB Contrast  Result Date: 09/01/2018 CLINICAL DATA:  58 y/o  M; lung cancer for staging. EXAM: MRI HEAD WITHOUT AND WITH CONTRAST TECHNIQUE: Multiplanar, multiecho pulse sequences of the brain and surrounding structures were obtained without and with intravenous contrast. CONTRAST:  8 cc Gadavist COMPARISON:  10/07/2015 CT head. FINDINGS: Brain: No acute infarction, hemorrhage, hydrocephalus, extra-axial collection or mass lesion. 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 Few punctate nonspecific T2 FLAIR hyperintensities in subcortical and periventricular white matter are compatible with mild chronic microvascular ischemic changes. Mild volume loss of the brain. After administration of intravenous contrast there is no abnormal enhancement of the brain. Vascular: Normal flow voids. Skull and upper cervical spine: Normal marrow signal. Sinuses/Orbits: Mild right maxillary sinus  mucosal thickening. No additional abnormal signal of the paranasal sinuses or mastoid air cells. Orbits are unremarkable. Other: None. IMPRESSION: 1. No metastatic disease identified. 2. Mild chronic microvascular ischemic changes and volume loss of the brain. Electronically Signed   By: Kristine Garbe M.D.   On: 09/01/2018 06:15   US Guided Needle Placement  Result Date: 08/23/2018 CLINICAL DATA:  Hypermetabolic 2.9 cm right upper lobe nodule, consistent with primary bronchogenic carcinoma. Hypermetabolic right hilar and ipsilateral mediastinal lymphadenopathy, consistent with metastatic disease. Hypermetabolic bilateral supraclavicular lymphadenopathy, consistent with metastatic disease.  Biopsy requested. EXAM: ULTRASOUND GUIDED CORE BIOPSY OF LEFT SUPRACLAVICULAR ADENOPATHY MEDICATIONS: Intravenous Fentanyl and Versed were administered as conscious sedation during continuous monitoring of the patient's level of consciousness and physiological / cardiorespiratory status by the radiology RN, with a total moderate sedation time of 11 minutes. PROCEDURE: The procedure, risks, benefits, and alternatives were explained to the patient. Questions regarding the procedure were encouraged and answered. The patient understands and consents to the procedure. Survey ultrasound of the supraclavicular regions performed. The left adenopathy was localized and an appropriate skin entry site was determined and marked. The operative field was prepped with chlorhexidine in a sterile fashion, and a sterile drape was applied covering the operative field. A sterile gown and sterile gloves were used for the procedure. Local anesthesia was provided with 1% Lidocaine. Under real-time ultrasound guidance, a 17 gauge trocar needle was advanced to  the margin of the lesion. Once needle tip position was confirmed, coaxial 18-gauge core biopsy samples were obtained, submitted in saline to surgical pathology. The guide needle was  removed. Postprocedure scans show no hemorrhage or other apparent complication. The patient tolerated the procedure well. COMPLICATIONS: None. FINDINGS: Bilateral supraclavicular adenopathy localized. Representative core biopsy samples obtained of the left supraclavicular adenopathy as above. IMPRESSION: 1. Technically successful ultrasound-guided core biopsy, left supraclavicular adenopathy. Electronically Signed   By: Lucrezia Europe M.D.   On: 08/23/2018 09:06     ASSESSMENT/PLAN:  This is a very pleasant 58 year old Caucasian male with stage IIIb non-small cell lung cancer consistent with adenocarcinoma.  He is currently undergoing concurrent chemoradiation with weekly carboplatin for AUC of 2 and paclitaxel 45 mg/m2 followed by consolidation immunotherapy if the patient has no evidence of disease progression after the induction phase. The patient is status post 1 cycle.   The patient was seen today with Dr. Julien Nordmann.  The patient tolerated his first treatment well with no concerning adverse effects except for mild "queeziness" which was relieved by Compazine.   The patient recently recently underwent a brain MRI which did not show any evidence of brain metastasis.  The MRI and labs were independently reviewed by Dr. Julien Nordmann and discussed with the patient. I recommend for him to proceed with cycle 2 of treatment today as scheduled.  A refill of Compazine was sent to the patient's pharmacy.   Foundation 1 testing was sent out by pathology and results are pending.   We will see him back for follow-up in 2 weeks for evaluation prior starting to cycle number 4.   The patient was advised to call immediately if he has any concerning symptoms in the interval. The patient voices understanding of current disease status and treatment options and is in agreement with the current care plan. All questions were answered. The patient knows to call the clinic with any problems, questions or concerns. We can  certainly see the patient much sooner if necessary   No orders of the defined types were placed in this encounter.    Yelina Sarratt L Bentli Llorente, PA-C 09/12/18  ADDENDUM: Hematology/Oncology Attending: I had a face-to-face encounter with the patient today.  I recommended his care plan.  This is a very pleasant 58 years old white male with a stage IIIb non-small cell lung cancer, adenocarcinoma.  The patient is currently undergoing a course of concurrent chemoradiation with weekly carboplatin and paclitaxel status post 1 cycle.  He tolerated the first cycle of his treatment well with no concerning adverse effects. He had MRI of the brain performed recently that showed no concerning findings for disease progression to the brain. I requested his tissue block to be sent to foundation 1 for molecular studies. I recommended for the patient to continue his current treatment with concurrent chemoradiation and he would proceed with cycle #2 today. The patient will come back for follow-up visit in 2 weeks for evaluation before starting cycle #4. He was advised to call immediately if he has any concerning symptoms in the interval.  Disclaimer: This [name] was dictated with voice recognition software. Similar sounding words can inadvertently be transcribed and may be missed upon review. Eilleen Kempf, MD 09/12/18

## 2018-09-13 ENCOUNTER — Telehealth: Payer: Self-pay | Admitting: Internal Medicine

## 2018-09-13 ENCOUNTER — Telehealth: Payer: Self-pay | Admitting: Medical Oncology

## 2018-09-13 ENCOUNTER — Ambulatory Visit
Admission: RE | Admit: 2018-09-13 | Discharge: 2018-09-13 | Disposition: A | Discharge status: Home / Self Care | Payer: BLUE CROSS/BLUE SHIELD | Source: Ambulatory Visit | Attending: Radiation Oncology | Admitting: Radiation Oncology

## 2018-09-13 DIAGNOSIS — Z510 Encounter for antineoplastic radiation therapy: Secondary | ICD-10-CM | POA: Diagnosis not present

## 2018-09-13 NOTE — Telephone Encounter (Signed)
wants am chemo appts. Schedule message sent for 2/25 and 3/2

## 2018-09-13 NOTE — Telephone Encounter (Signed)
Unable to r/s appt per patient resquest 2/17 appt - left message for patient letting them know

## 2018-09-13 NOTE — Telephone Encounter (Signed)
R/s appt per 2/11 sch message - unable to r/s 2/25 appt due to MM f/u time - spoke with patient and pt understands.

## 2018-09-14 ENCOUNTER — Ambulatory Visit
Admission: RE | Admit: 2018-09-14 | Discharge: 2018-09-14 | Disposition: A | Discharge status: Home / Self Care | Payer: BLUE CROSS/BLUE SHIELD | Source: Ambulatory Visit | Attending: Radiation Oncology | Admitting: Radiation Oncology

## 2018-09-14 DIAGNOSIS — Z510 Encounter for antineoplastic radiation therapy: Secondary | ICD-10-CM | POA: Diagnosis not present

## 2018-09-15 ENCOUNTER — Ambulatory Visit
Admission: RE | Admit: 2018-09-15 | Discharge: 2018-09-15 | Disposition: A | Discharge status: Home / Self Care | Payer: BLUE CROSS/BLUE SHIELD | Source: Ambulatory Visit | Attending: Radiation Oncology | Admitting: Radiation Oncology

## 2018-09-15 DIAGNOSIS — Z510 Encounter for antineoplastic radiation therapy: Secondary | ICD-10-CM | POA: Diagnosis not present

## 2018-09-16 ENCOUNTER — Ambulatory Visit
Admission: RE | Admit: 2018-09-16 | Discharge: 2018-09-16 | Disposition: A | Discharge status: Home / Self Care | Payer: BLUE CROSS/BLUE SHIELD | Source: Ambulatory Visit | Attending: Radiation Oncology | Admitting: Radiation Oncology

## 2018-09-16 ENCOUNTER — Encounter: Payer: Self-pay | Admitting: Internal Medicine

## 2018-09-16 DIAGNOSIS — Z510 Encounter for antineoplastic radiation therapy: Secondary | ICD-10-CM | POA: Diagnosis not present

## 2018-09-19 ENCOUNTER — Ambulatory Visit
Admission: RE | Admit: 2018-09-19 | Discharge: 2018-09-19 | Disposition: A | Discharge status: Home / Self Care | Payer: BLUE CROSS/BLUE SHIELD | Source: Ambulatory Visit | Attending: Radiation Oncology | Admitting: Radiation Oncology

## 2018-09-19 ENCOUNTER — Inpatient Hospital Stay: Payer: BLUE CROSS/BLUE SHIELD

## 2018-09-19 ENCOUNTER — Encounter: Payer: Self-pay | Admitting: *Deleted

## 2018-09-19 VITALS — BP 129/85 | HR 96 | Temp 98.5°F | Resp 16

## 2018-09-19 DIAGNOSIS — Z510 Encounter for antineoplastic radiation therapy: Secondary | ICD-10-CM | POA: Diagnosis not present

## 2018-09-19 DIAGNOSIS — C3491 Malignant neoplasm of unspecified part of right bronchus or lung: Secondary | ICD-10-CM

## 2018-09-19 LAB — CBC WITH DIFFERENTIAL (CANCER CENTER ONLY)
Abs Immature Granulocytes: 0.09 10*3/uL — ABNORMAL HIGH (ref 0.00–0.07)
BASOS ABS: 0 10*3/uL (ref 0.0–0.1)
Basophils Relative: 1 %
Eosinophils Absolute: 0.1 10*3/uL (ref 0.0–0.5)
Eosinophils Relative: 2 %
HCT: 38.9 % — ABNORMAL LOW (ref 39.0–52.0)
Hemoglobin: 13.2 g/dL (ref 13.0–17.0)
Immature Granulocytes: 2 %
Lymphocytes Relative: 9 %
Lymphs Abs: 0.5 10*3/uL — ABNORMAL LOW (ref 0.7–4.0)
MCH: 30.8 pg (ref 26.0–34.0)
MCHC: 33.9 g/dL (ref 30.0–36.0)
MCV: 90.9 fL (ref 80.0–100.0)
Monocytes Absolute: 0.9 10*3/uL (ref 0.1–1.0)
Monocytes Relative: 15 %
Neutro Abs: 4.4 10*3/uL (ref 1.7–7.7)
Neutrophils Relative %: 71 %
PLATELETS: 168 10*3/uL (ref 150–400)
RBC: 4.28 MIL/uL (ref 4.22–5.81)
RDW: 13.5 % (ref 11.5–15.5)
WBC Count: 6 10*3/uL (ref 4.0–10.5)
nRBC: 0 % (ref 0.0–0.2)

## 2018-09-19 LAB — CMP (CANCER CENTER ONLY)
ALT: 36 U/L (ref 0–44)
AST: 26 U/L (ref 15–41)
Albumin: 3.7 g/dL (ref 3.5–5.0)
Alkaline Phosphatase: 62 U/L (ref 38–126)
Anion gap: 9 (ref 5–15)
BUN: 20 mg/dL (ref 6–20)
CO2: 24 mmol/L (ref 22–32)
Calcium: 8.4 mg/dL — ABNORMAL LOW (ref 8.9–10.3)
Chloride: 108 mmol/L (ref 98–111)
Creatinine: 1.06 mg/dL (ref 0.61–1.24)
GFR, Estimated: >60 mL/min (ref >60)
Glucose, Bld: 96 mg/dL (ref 70–99)
Potassium: 4.4 mmol/L (ref 3.5–5.1)
Sodium: 141 mmol/L (ref 135–145)
TOTAL PROTEIN: 6.5 g/dL (ref 6.5–8.1)
Total Bilirubin: 0.3 mg/dL (ref 0.3–1.2)

## 2018-09-19 MED ORDER — SODIUM CHLORIDE 0.9 % IV SOLN
240.0000 mg | Freq: Once | INTRAVENOUS | Status: AC
  Administered 2018-09-19: 240 mg via INTRAVENOUS
  Filled 2018-09-19: qty 24

## 2018-09-19 MED ORDER — SODIUM CHLORIDE 0.9 % IV SOLN
45.0000 mg/m2 | Freq: Once | INTRAVENOUS | Status: AC
  Administered 2018-09-19: 90 mg via INTRAVENOUS
  Filled 2018-09-19: qty 15

## 2018-09-19 MED ORDER — DIPHENHYDRAMINE HCL 50 MG/ML IJ SOLN
INTRAMUSCULAR | Status: AC
  Filled 2018-09-19: qty 1

## 2018-09-19 MED ORDER — FAMOTIDINE IN NACL 20-0.9 MG/50ML-% IV SOLN
INTRAVENOUS | Status: AC
  Filled 2018-09-19: qty 50

## 2018-09-19 MED ORDER — DIPHENHYDRAMINE HCL 50 MG/ML IJ SOLN
50.0000 mg | Freq: Once | INTRAMUSCULAR | Status: AC
  Administered 2018-09-19: 50 mg via INTRAVENOUS

## 2018-09-19 MED ORDER — SODIUM CHLORIDE 0.9 % IV SOLN
20.0000 mg | Freq: Once | INTRAVENOUS | Status: AC
  Administered 2018-09-19: 20 mg via INTRAVENOUS
  Filled 2018-09-19: qty 20

## 2018-09-19 MED ORDER — FAMOTIDINE IN NACL 20-0.9 MG/50ML-% IV SOLN
20.0000 mg | Freq: Once | INTRAVENOUS | Status: AC
  Administered 2018-09-19: 20 mg via INTRAVENOUS

## 2018-09-19 MED ORDER — SODIUM CHLORIDE 0.9 % IV SOLN
Freq: Once | INTRAVENOUS | Status: AC
  Administered 2018-09-19: 15:00:00 via INTRAVENOUS
  Filled 2018-09-19: qty 250

## 2018-09-19 MED ORDER — PALONOSETRON HCL INJECTION 0.25 MG/5ML
0.2500 mg | Freq: Once | INTRAVENOUS | Status: AC
  Administered 2018-09-19: 0.25 mg via INTRAVENOUS

## 2018-09-19 MED ORDER — PALONOSETRON HCL INJECTION 0.25 MG/5ML
INTRAVENOUS | Status: AC
  Filled 2018-09-19: qty 5

## 2018-09-19 NOTE — Progress Notes (Signed)
Oncology Nurse Navigator Documentation  Oncology Nurse Navigator Flowsheets 09/19/2018  Navigator Location [facility]  Navigator Encounter Type Treatment/I spoke with Dr. Julien Nordmann due to patient having his Guardant 360 test done today.  He asked that I call pathology to check and see if there is enough tissue for Foundation one. I called and the cell block has not come back yet.  I updated Dr. Julien Nordmann that they are unsure if there is enough tissue.  He ask to send Guardant testing today.  I explained to patient.  He verbalized understanding.   Treatment Phase Treatment  Barriers/Navigation Needs Education;Coordination of Care  Education Other  Interventions Coordination of Care;Education  Coordination of Care Other  Education Method Verbal  Acuity Level 2  Time Spent with Patient 30

## 2018-09-19 NOTE — Patient Instructions (Signed)
[facility] Discharge Instructions for Patients Receiving Chemotherapy  Today you received the following chemotherapy agents: paclitaxel (Taxol) and carboplatin (Paraplatin).  To help prevent nausea and vomiting after your treatment, we encourage you to take your nausea medication as directed by your physician.    If you develop nausea and vomiting that is not controlled by your nausea medication, call the clinic.   BELOW ARE SYMPTOMS THAT SHOULD BE REPORTED IMMEDIATELY:  *FEVER GREATER THAN 100.5 F  *CHILLS WITH OR WITHOUT FEVER  NAUSEA AND VOMITING THAT IS NOT CONTROLLED WITH YOUR NAUSEA MEDICATION  *UNUSUAL SHORTNESS OF BREATH  *UNUSUAL BRUISING OR BLEEDING  TENDERNESS IN MOUTH AND THROAT WITH OR WITHOUT PRESENCE OF ULCERS  *URINARY PROBLEMS  *BOWEL PROBLEMS  UNUSUAL RASH Items with * indicate a potential emergency and should be followed up as soon as possible.  Feel free to call the clinic should you have any questions or concerns. The clinic phone number is [phone].  Please show the [facility] at check-in to the Emergency Department and triage nurse.  P

## 2018-09-20 ENCOUNTER — Ambulatory Visit
Admission: RE | Admit: 2018-09-20 | Discharge: 2018-09-20 | Disposition: A | Discharge status: Home / Self Care | Payer: BLUE CROSS/BLUE SHIELD | Source: Ambulatory Visit | Attending: Radiation Oncology | Admitting: Radiation Oncology

## 2018-09-20 ENCOUNTER — Other Ambulatory Visit: Payer: Self-pay | Admitting: Radiation Oncology

## 2018-09-20 DIAGNOSIS — Z510 Encounter for antineoplastic radiation therapy: Secondary | ICD-10-CM | POA: Diagnosis not present

## 2018-09-20 MED ORDER — SUCRALFATE 1 GM/10ML PO SUSP: 1.0000 g | Freq: Three times a day (TID) | ORAL | 0 refills | Status: DC

## 2018-09-21 ENCOUNTER — Ambulatory Visit
Admission: RE | Admit: 2018-09-21 | Discharge: 2018-09-21 | Disposition: A | Discharge status: Home / Self Care | Payer: BLUE CROSS/BLUE SHIELD | Source: Ambulatory Visit | Attending: Radiation Oncology | Admitting: Radiation Oncology

## 2018-09-21 ENCOUNTER — Encounter: Payer: Self-pay | Admitting: *Deleted

## 2018-09-21 DIAGNOSIS — Z510 Encounter for antineoplastic radiation therapy: Secondary | ICD-10-CM | POA: Diagnosis not present

## 2018-09-21 NOTE — Progress Notes (Signed)
Oncology Nurse Navigator Documentation  Oncology Nurse Navigator Flowsheets 09/21/2018  Navigator Location [facility]  Navigator Encounter Type Treatment/ I received a call from pathology dept.  Brian [name] tissue block is not arrived to them yet.  They called Foundation one and requested sample to come back and I was told it will be sent to path tomorrow.   Treatment Phase Treatment  Barriers/Navigation Needs Coordination of Care  Interventions Coordination of Care  Coordination of Care Other  Acuity Level 1  Time Spent with Patient 15

## 2018-09-22 ENCOUNTER — Ambulatory Visit
Admission: RE | Admit: 2018-09-22 | Discharge: 2018-09-22 | Disposition: A | Discharge status: Home / Self Care | Payer: BLUE CROSS/BLUE SHIELD | Source: Ambulatory Visit | Attending: Radiation Oncology | Admitting: Radiation Oncology

## 2018-09-22 DIAGNOSIS — Z510 Encounter for antineoplastic radiation therapy: Secondary | ICD-10-CM | POA: Diagnosis not present

## 2018-09-23 ENCOUNTER — Ambulatory Visit
Admission: RE | Admit: 2018-09-23 | Discharge: 2018-09-23 | Disposition: A | Discharge status: Home / Self Care | Payer: BLUE CROSS/BLUE SHIELD | Source: Ambulatory Visit | Attending: Radiation Oncology | Admitting: Radiation Oncology

## 2018-09-23 DIAGNOSIS — Z510 Encounter for antineoplastic radiation therapy: Secondary | ICD-10-CM | POA: Diagnosis not present

## 2018-09-26 ENCOUNTER — Ambulatory Visit
Admission: RE | Admit: 2018-09-26 | Discharge: 2018-09-26 | Disposition: A | Discharge status: Home / Self Care | Payer: BLUE CROSS/BLUE SHIELD | Source: Ambulatory Visit | Attending: Radiation Oncology | Admitting: Radiation Oncology

## 2018-09-26 DIAGNOSIS — Z510 Encounter for antineoplastic radiation therapy: Secondary | ICD-10-CM | POA: Diagnosis not present

## 2018-09-27 ENCOUNTER — Inpatient Hospital Stay: Payer: BLUE CROSS/BLUE SHIELD

## 2018-09-27 ENCOUNTER — Encounter: Payer: Self-pay | Admitting: *Deleted

## 2018-09-27 ENCOUNTER — Ambulatory Visit
Admission: RE | Admit: 2018-09-27 | Discharge: 2018-09-27 | Disposition: A | Discharge status: Home / Self Care | Payer: BLUE CROSS/BLUE SHIELD | Source: Ambulatory Visit | Attending: Radiation Oncology | Admitting: Radiation Oncology

## 2018-09-27 ENCOUNTER — Encounter: Payer: Self-pay | Admitting: Internal Medicine

## 2018-09-27 ENCOUNTER — Inpatient Hospital Stay (HOSPITAL_BASED_OUTPATIENT_CLINIC_OR_DEPARTMENT_OTHER): Payer: BLUE CROSS/BLUE SHIELD | Admitting: Internal Medicine

## 2018-09-27 VITALS — BP 134/92 | HR 95 | Temp 98.2°F | Resp 17 | Ht 68.0 in | Wt 183.7 lb

## 2018-09-27 DIAGNOSIS — J029 Acute pharyngitis, unspecified: Secondary | ICD-10-CM | POA: Diagnosis not present

## 2018-09-27 DIAGNOSIS — Z5111 Encounter for antineoplastic chemotherapy: Secondary | ICD-10-CM

## 2018-09-27 DIAGNOSIS — C3491 Malignant neoplasm of unspecified part of right bronchus or lung: Secondary | ICD-10-CM

## 2018-09-27 DIAGNOSIS — R590 Localized enlarged lymph nodes: Secondary | ICD-10-CM | POA: Diagnosis not present

## 2018-09-27 DIAGNOSIS — Z79899 Other long term (current) drug therapy: Secondary | ICD-10-CM

## 2018-09-27 DIAGNOSIS — Z7984 Long term (current) use of oral hypoglycemic drugs: Secondary | ICD-10-CM

## 2018-09-27 DIAGNOSIS — C3411 Malignant neoplasm of upper lobe, right bronchus or lung: Secondary | ICD-10-CM

## 2018-09-27 DIAGNOSIS — E119 Type 2 diabetes mellitus without complications: Secondary | ICD-10-CM

## 2018-09-27 DIAGNOSIS — Z510 Encounter for antineoplastic radiation therapy: Secondary | ICD-10-CM | POA: Diagnosis not present

## 2018-09-27 DIAGNOSIS — I10 Essential (primary) hypertension: Secondary | ICD-10-CM

## 2018-09-27 LAB — CMP (CANCER CENTER ONLY)
ALT: 36 U/L (ref 0–44)
AST: 21 U/L (ref 15–41)
Albumin: 3.7 g/dL (ref 3.5–5.0)
Alkaline Phosphatase: 69 U/L (ref 38–126)
Anion gap: 7 (ref 5–15)
BUN: 9 mg/dL (ref 6–20)
CO2: 23 mmol/L (ref 22–32)
Calcium: 8.8 mg/dL — ABNORMAL LOW (ref 8.9–10.3)
Chloride: 108 mmol/L (ref 98–111)
Creatinine: 0.79 mg/dL (ref 0.61–1.24)
GFR, Est AFR Am: >60 mL/min (ref >60)
Glucose, Bld: 94 mg/dL (ref 70–99)
Potassium: 4.2 mmol/L (ref 3.5–5.1)
Sodium: 138 mmol/L (ref 135–145)
Total Bilirubin: 0.3 mg/dL (ref 0.3–1.2)
Total Protein: 6.5 g/dL (ref 6.5–8.1)

## 2018-09-27 LAB — CBC WITH DIFFERENTIAL (CANCER CENTER ONLY)
Abs Immature Granulocytes: 0.09 10*3/uL — ABNORMAL HIGH (ref 0.00–0.07)
Basophils Absolute: 0 10*3/uL (ref 0.0–0.1)
Basophils Relative: 1 %
Eosinophils Absolute: 0 10*3/uL (ref 0.0–0.5)
Eosinophils Relative: 1 %
HCT: 38.5 % — ABNORMAL LOW (ref 39.0–52.0)
Hemoglobin: 13 g/dL (ref 13.0–17.0)
Immature Granulocytes: 2 %
LYMPHS PCT: 10 %
Lymphs Abs: 0.5 10*3/uL — ABNORMAL LOW (ref 0.7–4.0)
MCH: 30.7 pg (ref 26.0–34.0)
MCHC: 33.8 g/dL (ref 30.0–36.0)
MCV: 91 fL (ref 80.0–100.0)
Monocytes Absolute: 1 10*3/uL (ref 0.1–1.0)
Monocytes Relative: 21 %
Neutro Abs: 3.1 10*3/uL (ref 1.7–7.7)
Neutrophils Relative %: 65 %
Platelet Count: 174 10*3/uL (ref 150–400)
RBC: 4.23 MIL/uL (ref 4.22–5.81)
RDW: 13.7 % (ref 11.5–15.5)
WBC: 4.7 10*3/uL (ref 4.0–10.5)
nRBC: 0 % (ref 0.0–0.2)

## 2018-09-27 MED ORDER — SODIUM CHLORIDE 0.9 % IV SOLN
45.0000 mg/m2 | Freq: Once | INTRAVENOUS | Status: AC
  Administered 2018-09-27: 90 mg via INTRAVENOUS
  Filled 2018-09-27: qty 15

## 2018-09-27 MED ORDER — SODIUM CHLORIDE 0.9 % IV SOLN
Freq: Once | INTRAVENOUS | Status: AC
  Administered 2018-09-27: 13:00:00 via INTRAVENOUS
  Filled 2018-09-27: qty 250

## 2018-09-27 MED ORDER — PALONOSETRON HCL INJECTION 0.25 MG/5ML
INTRAVENOUS | Status: AC
  Filled 2018-09-27: qty 5

## 2018-09-27 MED ORDER — PALONOSETRON HCL INJECTION 0.25 MG/5ML
0.2500 mg | Freq: Once | INTRAVENOUS | Status: AC
  Administered 2018-09-27: 0.25 mg via INTRAVENOUS

## 2018-09-27 MED ORDER — SODIUM CHLORIDE 0.9 % IV SOLN
20.0000 mg | Freq: Once | INTRAVENOUS | Status: AC
  Administered 2018-09-27: 20 mg via INTRAVENOUS
  Filled 2018-09-27: qty 2

## 2018-09-27 MED ORDER — DIPHENHYDRAMINE HCL 50 MG/ML IJ SOLN
50.0000 mg | Freq: Once | INTRAMUSCULAR | Status: AC
  Administered 2018-09-27: 50 mg via INTRAVENOUS

## 2018-09-27 MED ORDER — FAMOTIDINE IN NACL 20-0.9 MG/50ML-% IV SOLN: 20.0000 mg | Freq: Once | INTRAVENOUS | Status: DC

## 2018-09-27 MED ORDER — SODIUM CHLORIDE 0.9 % IV SOLN
285.0000 mg | Freq: Once | INTRAVENOUS | Status: AC
  Administered 2018-09-27: 290 mg via INTRAVENOUS
  Filled 2018-09-27: qty 29

## 2018-09-27 MED ORDER — DIPHENHYDRAMINE HCL 50 MG/ML IJ SOLN
INTRAMUSCULAR | Status: AC
  Filled 2018-09-27: qty 1

## 2018-09-27 NOTE — Progress Notes (Signed)
[facility] [phone]   [phone]  OFFICE PROGRESS NOTE  [name], MD [address] [place] [address]  DIAGNOSIS: stage IIIB (T1c, N3, M0)non-small cell lung cancer, adenocarcinoma presented with right upper lobe lung mass in addition to mediastinal and bilateral supraclavicular lymphadenopathy diagnosed in January 2020. PD-L1: 10%  PRIOR THERAPY: None  CURRENT THERAPY: Concurrent chemoradiation with weekly carboplatin for AUC of 2 and paclitaxel 45 mg/M2.  Status post 3 cycles.  INTERVAL HISTORY: Brian [name] 58 y.o. male returns to the clinic today for follow-up visit accompanied by his wife.  The patient is feeling fine today with no concerning complaints except for mild sore throat.  He was started on Carafate by Dr. Sondra Come.  He is feeling much better.  He denied having any significant chest pain, shortness of breath, cough or hemoptysis.  He denied having any fever or chills.  He has no nausea, vomiting, diarrhea or constipation.  He has no headache or visual changes.  The patient is here today for evaluation before starting cycle #4 of his treatment.  He had molecular studies done by Guardant 360 but no detectable mutation were seen.    MEDICAL HISTORY: Past Medical History:  Diagnosis Date  . Diabetes mellitus without complication ([facility])   . Hypertension   . Subarachnoid hemorrhage ([facility])     ALLERGIES:  has No Known Allergies.  MEDICATIONS:  Current Outpatient Medications  Medication Sig Dispense Refill  . amLODipine (NORVASC) 5 MG tablet Take by mouth.    Marland Kitchen atorvastatin (LIPITOR) 20 MG tablet Take by mouth.    . metFORMIN (GLUCOPHAGE) 500 MG tablet Take by mouth.    . prochlorperazine (COMPAZINE) 10 MG tablet Take 1 tablet (10 mg total) by mouth every 6 (six) hours as needed for nausea or vomiting. 30 tablet 0  . sucralfate (CARAFATE) 1 GM/10ML suspension Take 10 mLs (1 g total) by mouth 4 (four) times  daily -  with meals and at bedtime. 420 mL 0  . venlafaxine (EFFEXOR) 37.5 MG tablet TAKE 1 TABLET ONCE DAILY.     No current facility-administered medications for this visit.     SURGICAL HISTORY:  Past Surgical History:  Procedure Laterality Date  . LEG SURGERY  age 19    left leg, from fracture    REVIEW OF SYSTEMS:  A comprehensive review of systems was negative except for: Constitutional: positive for fatigue   PHYSICAL EXAMINATION: General appearance: alert, cooperative, fatigued and no distress Head: Normocephalic, without obvious abnormality, atraumatic Neck: no adenopathy, no JVD, supple, symmetrical, trachea midline and thyroid not enlarged, symmetric, no tenderness/mass/nodules Lymph nodes: Cervical, supraclavicular, and axillary nodes normal. Resp: clear to auscultation bilaterally Back: symmetric, no curvature. ROM normal. No CVA tenderness. Cardio: regular rate and rhythm, S1, S2 normal, no murmur, click, rub or gallop GI: soft, non-tender; bowel sounds normal; no masses,  no organomegaly Extremities: extremities normal, atraumatic, no cyanosis or edema  ECOG PERFORMANCE STATUS: 1 - Symptomatic but completely ambulatory  Blood pressure (!) 134/92, pulse 95, temperature 98.2 F (36.8 C), temperature source Oral, resp. rate 17, height '5\' 8"'  (1.727 m), weight 183 lb 11.2 oz (83.3 kg), SpO2 98 %.  LABORATORY DATA: Lab Results  Component Value Date   WBC 4.7 09/27/2018   HGB 13.0 09/27/2018   HCT 38.5 (L) 09/27/2018   MCV 91.0 09/27/2018   PLT 174 09/27/2018      Chemistry  Component Value Date/Time   NA 141 09/19/2018 1318   K 4.4 09/19/2018 1318   CL 108 09/19/2018 1318   CO2 24 09/19/2018 1318   BUN 20 09/19/2018 1318   CREATININE 1.06 09/19/2018 1318      Component Value Date/Time   CALCIUM 8.4 (L) 09/19/2018 1318   ALKPHOS 62 09/19/2018 1318   AST 26 09/19/2018 1318   ALT 36 09/19/2018 1318   BILITOT 0.3 09/19/2018 1318       RADIOGRAPHIC  STUDIES: Mr Jeri Cos BM Contrast  Result Date: 09/01/2018 CLINICAL DATA:  58 y/o  M; lung cancer for staging. EXAM: MRI HEAD WITHOUT AND WITH CONTRAST TECHNIQUE: Multiplanar, multiecho pulse sequences of the brain and surrounding structures were obtained without and with intravenous contrast. CONTRAST:  8 cc Gadavist COMPARISON:  10/07/2015 CT head. FINDINGS: Brain: No acute infarction, hemorrhage, hydrocephalus, extra-axial collection or mass lesion. 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 Few punctate nonspecific T2 FLAIR hyperintensities in subcortical and periventricular white matter are compatible with mild chronic microvascular ischemic changes. Mild volume loss of the brain. After administration of intravenous contrast there is no abnormal enhancement of the brain. Vascular: Normal flow voids. Skull and upper cervical spine: Normal marrow signal. Sinuses/Orbits: Mild right maxillary sinus mucosal thickening. No additional abnormal signal of the paranasal sinuses or mastoid air cells. Orbits are unremarkable. Other: None. IMPRESSION: 1. No metastatic disease identified. 2. Mild chronic microvascular ischemic changes and volume loss of the brain. Electronically Signed   By: Kristine Garbe M.D.   On: 09/01/2018 06:15    ASSESSMENT AND PLAN: This is a very pleasant 58 years old white male with a stage IIIb non-small cell lung cancer, adenocarcinoma diagnosed in January 2020 with PDL 1 expression of 10% and no actionable mutations on Guardant 360 blood test. The patient is currently undergoing a course of concurrent chemoradiation with weekly carboplatin and paclitaxel status post 3 cycles.  The patient is tolerating his treatment fairly well with no concerning adverse effects. I recommended for him to proceed with cycle #4 today. I will see the patient back for follow-up visit in 2 weeks for evaluation before starting cycle #6. I will request his tissue block to be  sent to foundation 1 for molecular studies. The patient was advised to call immediately if he has any concerning symptoms in the interval. The patient voices understanding of current disease status and treatment options and is in agreement with the current care plan. All questions were answered. The patient knows to call the clinic with any problems, questions or concerns. We can certainly see the patient much sooner if necessary.  I spent 10 minutes counseling the patient face to face. The total time spent in the appointment was 15 minutes.  Disclaimer: This note was dictated with voice recognition software. Similar sounding words can inadvertently be transcribed and may not be corrected upon review.

## 2018-09-27 NOTE — Progress Notes (Signed)
Oncology Nurse Navigator Documentation  Oncology Nurse Navigator Flowsheets 09/27/2018  Navigator Location [facility]  Navigator Encounter Type Other/per Dr. Julien Nordmann, I called pathology to get an update regarding Brian [name]'s tissue being sent to foundation one.  They received the tissue back on Friday after PDL 1 testing and will be sent out today for foundation one testing.   Treatment Phase Treatment  Barriers/Navigation Needs Coordination of Care  Interventions Coordination of Care  Coordination of Care Other  Acuity Level 2  Time Spent with Patient 32

## 2018-09-27 NOTE — Patient Instructions (Signed)
[facility] Discharge Instructions for Patients Receiving Chemotherapy  Today you received the following chemotherapy agents: paclitaxel (Taxol) and carboplatin (Paraplatin).  To help prevent nausea and vomiting after your treatment, we encourage you to take your nausea medication as directed by your physician.    If you develop nausea and vomiting that is not controlled by your nausea medication, call the clinic.   BELOW ARE SYMPTOMS THAT SHOULD BE REPORTED IMMEDIATELY:  *FEVER GREATER THAN 100.5 F  *CHILLS WITH OR WITHOUT FEVER  NAUSEA AND VOMITING THAT IS NOT CONTROLLED WITH YOUR NAUSEA MEDICATION  *UNUSUAL SHORTNESS OF BREATH  *UNUSUAL BRUISING OR BLEEDING  TENDERNESS IN MOUTH AND THROAT WITH OR WITHOUT PRESENCE OF ULCERS  *URINARY PROBLEMS  *BOWEL PROBLEMS  UNUSUAL RASH Items with * indicate a potential emergency and should be followed up as soon as possible.  Feel free to call the clinic should you have any questions or concerns. The clinic phone number is [phone].  Please show the [facility] at check-in to the Emergency Department and triage nurse.  P

## 2018-09-28 ENCOUNTER — Ambulatory Visit
Admission: RE | Admit: 2018-09-28 | Discharge: 2018-09-28 | Disposition: A | Discharge status: Home / Self Care | Payer: BLUE CROSS/BLUE SHIELD | Source: Ambulatory Visit | Attending: Radiation Oncology | Admitting: Radiation Oncology

## 2018-09-28 ENCOUNTER — Encounter: Payer: Self-pay | Admitting: *Deleted

## 2018-09-28 DIAGNOSIS — Z510 Encounter for antineoplastic radiation therapy: Secondary | ICD-10-CM | POA: Diagnosis not present

## 2018-09-28 LAB — GUARDANT 360

## 2018-09-28 NOTE — Progress Notes (Signed)
Oncology Nurse Navigator Documentation  Oncology Nurse Navigator Flowsheets 09/28/2018  Navigator Location [facility]  Navigator Encounter Type Other/I was updated from pathology dept that there is not enough tissue for foundation one testing.  I updated Dr. Julien Nordmann, no new orders at this time.   Treatment Phase Treatment  Barriers/Navigation Needs Coordination of Care  Interventions Coordination of Care  Coordination of Care Other  Acuity Level 2  Time Spent with Patient 30

## 2018-09-29 ENCOUNTER — Ambulatory Visit
Admission: RE | Admit: 2018-09-29 | Discharge: 2018-09-29 | Disposition: A | Discharge status: Home / Self Care | Payer: BLUE CROSS/BLUE SHIELD | Source: Ambulatory Visit | Attending: Radiation Oncology | Admitting: Radiation Oncology

## 2018-09-29 DIAGNOSIS — Z510 Encounter for antineoplastic radiation therapy: Secondary | ICD-10-CM | POA: Diagnosis not present

## 2018-09-29 MED FILL — OSELTAMIVIR PHOSPHATE 75 MG: 75 | 7 days supply | Qty: 7 | Fill #0

## 2018-09-30 ENCOUNTER — Ambulatory Visit
Admission: RE | Admit: 2018-09-30 | Discharge: 2018-09-30 | Disposition: A | Discharge status: Home / Self Care | Payer: BLUE CROSS/BLUE SHIELD | Source: Ambulatory Visit | Attending: Radiation Oncology | Admitting: Radiation Oncology

## 2018-09-30 DIAGNOSIS — Z510 Encounter for antineoplastic radiation therapy: Secondary | ICD-10-CM | POA: Diagnosis not present

## 2018-10-03 ENCOUNTER — Other Ambulatory Visit: Payer: BLUE CROSS/BLUE SHIELD

## 2018-10-03 ENCOUNTER — Inpatient Hospital Stay: Payer: BLUE CROSS/BLUE SHIELD

## 2018-10-03 ENCOUNTER — Ambulatory Visit: Payer: BLUE CROSS/BLUE SHIELD

## 2018-10-03 ENCOUNTER — Ambulatory Visit
Admission: RE | Admit: 2018-10-03 | Discharge: 2018-10-03 | Disposition: A | Discharge status: Home / Self Care | Payer: BLUE CROSS/BLUE SHIELD | Source: Ambulatory Visit | Attending: Radiation Oncology | Admitting: Radiation Oncology

## 2018-10-03 VITALS — BP 119/89 | HR 93 | Temp 98.6°F | Resp 18 | Wt 186.0 lb

## 2018-10-03 DIAGNOSIS — Z5111 Encounter for antineoplastic chemotherapy: Secondary | ICD-10-CM | POA: Insufficient documentation

## 2018-10-03 DIAGNOSIS — Z79899 Other long term (current) drug therapy: Secondary | ICD-10-CM

## 2018-10-03 DIAGNOSIS — E119 Type 2 diabetes mellitus without complications: Secondary | ICD-10-CM

## 2018-10-03 DIAGNOSIS — C778 Secondary and unspecified malignant neoplasm of lymph nodes of multiple regions: Secondary | ICD-10-CM | POA: Insufficient documentation

## 2018-10-03 DIAGNOSIS — C3491 Malignant neoplasm of unspecified part of right bronchus or lung: Secondary | ICD-10-CM

## 2018-10-03 DIAGNOSIS — I10 Essential (primary) hypertension: Secondary | ICD-10-CM

## 2018-10-03 DIAGNOSIS — Z510 Encounter for antineoplastic radiation therapy: Secondary | ICD-10-CM | POA: Diagnosis not present

## 2018-10-03 DIAGNOSIS — C3411 Malignant neoplasm of upper lobe, right bronchus or lung: Secondary | ICD-10-CM

## 2018-10-03 DIAGNOSIS — R1310 Dysphagia, unspecified: Secondary | ICD-10-CM

## 2018-10-03 DIAGNOSIS — Z7984 Long term (current) use of oral hypoglycemic drugs: Secondary | ICD-10-CM | POA: Insufficient documentation

## 2018-10-03 LAB — CBC WITH DIFFERENTIAL (CANCER CENTER ONLY)
Abs Immature Granulocytes: 0.07 10*3/uL (ref 0.00–0.07)
Basophils Absolute: 0.1 10*3/uL (ref 0.0–0.1)
Basophils Relative: 1 %
Eosinophils Absolute: 0.1 10*3/uL (ref 0.0–0.5)
Eosinophils Relative: 2 %
HCT: 39.2 % (ref 39.0–52.0)
Hemoglobin: 13.2 g/dL (ref 13.0–17.0)
Immature Granulocytes: 1 %
Lymphocytes Relative: 7 %
Lymphs Abs: 0.4 10*3/uL — ABNORMAL LOW (ref 0.7–4.0)
MCH: 30.7 pg (ref 26.0–34.0)
MCHC: 33.7 g/dL (ref 30.0–36.0)
MCV: 91.2 fL (ref 80.0–100.0)
Monocytes Absolute: 0.7 10*3/uL (ref 0.1–1.0)
Monocytes Relative: 13 %
NEUTROS ABS: 4.3 10*3/uL (ref 1.7–7.7)
Neutrophils Relative %: 76 %
Platelet Count: 140 10*3/uL — ABNORMAL LOW (ref 150–400)
RBC: 4.3 MIL/uL (ref 4.22–5.81)
RDW: 13.7 % (ref 11.5–15.5)
WBC Count: 5.6 10*3/uL (ref 4.0–10.5)
nRBC: 0 % (ref 0.0–0.2)

## 2018-10-03 LAB — CMP (CANCER CENTER ONLY)
ALT: 48 U/L — ABNORMAL HIGH (ref 0–44)
AST: 27 U/L (ref 15–41)
Albumin: 3.5 g/dL (ref 3.5–5.0)
Alkaline Phosphatase: 64 U/L (ref 38–126)
Anion gap: 9 (ref 5–15)
BUN: 15 mg/dL (ref 6–20)
CO2: 24 mmol/L (ref 22–32)
Calcium: 8.7 mg/dL — ABNORMAL LOW (ref 8.9–10.3)
Chloride: 105 mmol/L (ref 98–111)
Creatinine: 0.81 mg/dL (ref 0.61–1.24)
GFR, Est AFR Am: >60 mL/min (ref >60)
GFR, Estimated: >60 mL/min (ref >60)
Glucose, Bld: 94 mg/dL (ref 70–99)
POTASSIUM: 4.2 mmol/L (ref 3.5–5.1)
Sodium: 138 mmol/L (ref 135–145)
Total Bilirubin: 0.4 mg/dL (ref 0.3–1.2)
Total Protein: 6.3 g/dL — ABNORMAL LOW (ref 6.5–8.1)

## 2018-10-03 MED ORDER — PALONOSETRON HCL INJECTION 0.25 MG/5ML
INTRAVENOUS | Status: AC
  Filled 2018-10-03: qty 5

## 2018-10-03 MED ORDER — SODIUM CHLORIDE 0.9 % IV SOLN
20.0000 mg | Freq: Once | INTRAVENOUS | Status: AC
  Administered 2018-10-03: 20 mg via INTRAVENOUS
  Filled 2018-10-03: qty 2

## 2018-10-03 MED ORDER — DIPHENHYDRAMINE HCL 50 MG/ML IJ SOLN
50.0000 mg | Freq: Once | INTRAMUSCULAR | Status: AC
  Administered 2018-10-03: 50 mg via INTRAVENOUS

## 2018-10-03 MED ORDER — SODIUM CHLORIDE 0.9 % IV SOLN
45.0000 mg/m2 | Freq: Once | INTRAVENOUS | Status: AC
  Administered 2018-10-03: 90 mg via INTRAVENOUS
  Filled 2018-10-03: qty 15

## 2018-10-03 MED ORDER — SODIUM CHLORIDE 0.9 % IV SOLN
Freq: Once | INTRAVENOUS | Status: AC
  Administered 2018-10-03: 09:00:00 via INTRAVENOUS
  Filled 2018-10-03: qty 250

## 2018-10-03 MED ORDER — DIPHENHYDRAMINE HCL 50 MG/ML IJ SOLN
INTRAMUSCULAR | Status: AC
  Filled 2018-10-03: qty 1

## 2018-10-03 MED ORDER — SODIUM CHLORIDE 0.9 % IV SOLN
285.0000 mg | Freq: Once | INTRAVENOUS | Status: AC
  Administered 2018-10-03: 290 mg via INTRAVENOUS
  Filled 2018-10-03: qty 29

## 2018-10-03 MED ORDER — FAMOTIDINE IN NACL 20-0.9 MG/50ML-% IV SOLN: 20.0000 mg | Freq: Once | INTRAVENOUS | Status: DC

## 2018-10-03 MED ORDER — PALONOSETRON HCL INJECTION 0.25 MG/5ML
0.2500 mg | Freq: Once | INTRAVENOUS | Status: AC
  Administered 2018-10-03: 0.25 mg via INTRAVENOUS

## 2018-10-03 MED ORDER — SODIUM CHLORIDE 0.9 % IV SOLN
20.0000 mg | Freq: Once | INTRAVENOUS | Status: AC
  Administered 2018-10-03: 20 mg via INTRAVENOUS
  Filled 2018-10-03: qty 20

## 2018-10-03 NOTE — Patient Instructions (Signed)
   [facility] Discharge Instructions for Patients Receiving Chemotherapy  Today you received the following chemotherapy agents Taxol and Carboplatin   To help prevent nausea and vomiting after your treatment, we encourage you to take your nausea medication as directed.    If you develop nausea and vomiting that is not controlled by your nausea medication, call the clinic.   BELOW ARE SYMPTOMS THAT SHOULD BE REPORTED IMMEDIATELY:  *FEVER GREATER THAN 100.5 F  *CHILLS WITH OR WITHOUT FEVER  NAUSEA AND VOMITING THAT IS NOT CONTROLLED WITH YOUR NAUSEA MEDICATION  *UNUSUAL SHORTNESS OF BREATH  *UNUSUAL BRUISING OR BLEEDING  TENDERNESS IN MOUTH AND THROAT WITH OR WITHOUT PRESENCE OF ULCERS  *URINARY PROBLEMS  *BOWEL PROBLEMS  UNUSUAL RASH Items with * indicate a potential emergency and should be followed up as soon as possible.  Feel free to call the clinic should you have any questions or concerns. The clinic phone number is [phone].  Please show the CHEMO ALERT CARD at check-in to the Emergency Department and triage nurse.   

## 2018-10-04 ENCOUNTER — Telehealth: Payer: Self-pay | Admitting: Oncology

## 2018-10-04 ENCOUNTER — Ambulatory Visit
Admission: RE | Admit: 2018-10-04 | Discharge: 2018-10-04 | Disposition: A | Discharge status: Home / Self Care | Payer: BLUE CROSS/BLUE SHIELD | Source: Ambulatory Visit | Attending: Radiation Oncology | Admitting: Radiation Oncology

## 2018-10-04 DIAGNOSIS — Z510 Encounter for antineoplastic radiation therapy: Secondary | ICD-10-CM | POA: Diagnosis not present

## 2018-10-04 NOTE — Telephone Encounter (Signed)
Brian [name] contacted to obtain verbal, telephone consent to share their name and contact information with [facility] Scientist, product/process development and team) for purposes of soliciting patient experience feedback.  Verbal consent obtained and documented on "[place] [facility] [place] INFORMATION" form.  Brian [name] is aware that [facility] will be in contact with them at a future date for screening purposes for interviews.  Please direct questions related to this process to Sherry Ruffing via email at [name] or extension [phone].

## 2018-10-05 ENCOUNTER — Ambulatory Visit
Admission: RE | Admit: 2018-10-05 | Discharge: 2018-10-05 | Disposition: A | Discharge status: Home / Self Care | Payer: BLUE CROSS/BLUE SHIELD | Source: Ambulatory Visit | Attending: Radiation Oncology | Admitting: Radiation Oncology

## 2018-10-05 DIAGNOSIS — Z510 Encounter for antineoplastic radiation therapy: Secondary | ICD-10-CM | POA: Diagnosis not present

## 2018-10-06 ENCOUNTER — Ambulatory Visit
Admission: RE | Admit: 2018-10-06 | Discharge: 2018-10-06 | Disposition: A | Discharge status: Home / Self Care | Payer: BLUE CROSS/BLUE SHIELD | Source: Ambulatory Visit | Attending: Radiation Oncology | Admitting: Radiation Oncology

## 2018-10-06 DIAGNOSIS — Z510 Encounter for antineoplastic radiation therapy: Secondary | ICD-10-CM | POA: Diagnosis not present

## 2018-10-07 ENCOUNTER — Ambulatory Visit
Admission: RE | Admit: 2018-10-07 | Discharge: 2018-10-07 | Disposition: A | Discharge status: Home / Self Care | Payer: BLUE CROSS/BLUE SHIELD | Source: Ambulatory Visit | Attending: Radiation Oncology | Admitting: Radiation Oncology

## 2018-10-07 ENCOUNTER — Other Ambulatory Visit: Payer: Self-pay | Admitting: Physician Assistant

## 2018-10-07 ENCOUNTER — Other Ambulatory Visit: Payer: Self-pay

## 2018-10-07 DIAGNOSIS — C3491 Malignant neoplasm of unspecified part of right bronchus or lung: Secondary | ICD-10-CM

## 2018-10-07 DIAGNOSIS — Z510 Encounter for antineoplastic radiation therapy: Secondary | ICD-10-CM | POA: Diagnosis not present

## 2018-10-07 MED ORDER — SUCRALFATE 1 GM/10ML PO SUSP: 1.0000 g | Freq: Three times a day (TID) | ORAL | 1 refills | Status: DC

## 2018-10-10 ENCOUNTER — Inpatient Hospital Stay: Payer: BLUE CROSS/BLUE SHIELD

## 2018-10-10 ENCOUNTER — Encounter: Payer: Self-pay | Admitting: Internal Medicine

## 2018-10-10 ENCOUNTER — Inpatient Hospital Stay (HOSPITAL_BASED_OUTPATIENT_CLINIC_OR_DEPARTMENT_OTHER): Payer: BLUE CROSS/BLUE SHIELD | Admitting: Internal Medicine

## 2018-10-10 ENCOUNTER — Ambulatory Visit
Admission: RE | Admit: 2018-10-10 | Discharge: 2018-10-10 | Disposition: A | Discharge status: Home / Self Care | Payer: BLUE CROSS/BLUE SHIELD | Source: Ambulatory Visit | Attending: Radiation Oncology | Admitting: Radiation Oncology

## 2018-10-10 ENCOUNTER — Telehealth: Payer: Self-pay | Admitting: Internal Medicine

## 2018-10-10 VITALS — BP 121/84 | HR 115 | Temp 98.2°F | Resp 18 | Ht 68.0 in | Wt 186.0 lb

## 2018-10-10 VITALS — HR 93

## 2018-10-10 DIAGNOSIS — C778 Secondary and unspecified malignant neoplasm of lymph nodes of multiple regions: Secondary | ICD-10-CM | POA: Diagnosis not present

## 2018-10-10 DIAGNOSIS — C3490 Malignant neoplasm of unspecified part of unspecified bronchus or lung: Secondary | ICD-10-CM

## 2018-10-10 DIAGNOSIS — I10 Essential (primary) hypertension: Secondary | ICD-10-CM

## 2018-10-10 DIAGNOSIS — C3411 Malignant neoplasm of upper lobe, right bronchus or lung: Secondary | ICD-10-CM

## 2018-10-10 DIAGNOSIS — Z5111 Encounter for antineoplastic chemotherapy: Secondary | ICD-10-CM | POA: Diagnosis not present

## 2018-10-10 DIAGNOSIS — R1310 Dysphagia, unspecified: Secondary | ICD-10-CM

## 2018-10-10 DIAGNOSIS — E119 Type 2 diabetes mellitus without complications: Secondary | ICD-10-CM

## 2018-10-10 DIAGNOSIS — Z7984 Long term (current) use of oral hypoglycemic drugs: Secondary | ICD-10-CM

## 2018-10-10 DIAGNOSIS — C3491 Malignant neoplasm of unspecified part of right bronchus or lung: Secondary | ICD-10-CM

## 2018-10-10 DIAGNOSIS — Z510 Encounter for antineoplastic radiation therapy: Secondary | ICD-10-CM | POA: Diagnosis not present

## 2018-10-10 DIAGNOSIS — Z79899 Other long term (current) drug therapy: Secondary | ICD-10-CM

## 2018-10-10 LAB — CBC WITH DIFFERENTIAL (CANCER CENTER ONLY)
Abs Immature Granulocytes: 0.07 10*3/uL (ref 0.00–0.07)
Basophils Absolute: 0 10*3/uL (ref 0.0–0.1)
Basophils Relative: 0 %
Eosinophils Absolute: 0.1 10*3/uL (ref 0.0–0.5)
Eosinophils Relative: 1 %
HCT: 37.3 % — ABNORMAL LOW (ref 39.0–52.0)
HEMOGLOBIN: 12.4 g/dL — AB (ref 13.0–17.0)
Immature Granulocytes: 2 %
Lymphocytes Relative: 8 %
Lymphs Abs: 0.4 10*3/uL — ABNORMAL LOW (ref 0.7–4.0)
MCH: 30.6 pg (ref 26.0–34.0)
MCHC: 33.2 g/dL (ref 30.0–36.0)
MCV: 92.1 fL (ref 80.0–100.0)
Monocytes Absolute: 0.5 10*3/uL (ref 0.1–1.0)
Monocytes Relative: 12 %
Neutro Abs: 3.4 10*3/uL (ref 1.7–7.7)
Neutrophils Relative %: 77 %
Platelet Count: 132 10*3/uL — ABNORMAL LOW (ref 150–400)
RBC: 4.05 MIL/uL — ABNORMAL LOW (ref 4.22–5.81)
RDW: 13.9 % (ref 11.5–15.5)
WBC: 4.5 10*3/uL (ref 4.0–10.5)
nRBC: 0 % (ref 0.0–0.2)

## 2018-10-10 LAB — COMPREHENSIVE METABOLIC PANEL
ALK PHOS: 58 U/L (ref 38–126)
ALT: 49 U/L — ABNORMAL HIGH (ref 0–44)
AST: 25 U/L (ref 15–41)
Albumin: 3.9 g/dL (ref 3.5–5.0)
Anion gap: 7 (ref 5–15)
BUN: 19 mg/dL (ref 6–20)
CALCIUM: 8.9 mg/dL (ref 8.9–10.3)
CO2: 25 mmol/L (ref 22–32)
Chloride: 108 mmol/L (ref 98–111)
Creatinine, Ser: 0.66 mg/dL (ref 0.61–1.24)
GFR calc Af Amer: >60 mL/min (ref >60)
GFR calc non Af Amer: >60 mL/min (ref >60)
Glucose, Bld: 105 mg/dL — ABNORMAL HIGH (ref 70–99)
Potassium: 3.8 mmol/L (ref 3.5–5.1)
Sodium: 140 mmol/L (ref 135–145)
Total Bilirubin: 0.6 mg/dL (ref 0.3–1.2)
Total Protein: 6.4 g/dL — ABNORMAL LOW (ref 6.5–8.1)

## 2018-10-10 MED ORDER — PALONOSETRON HCL INJECTION 0.25 MG/5ML
0.2500 mg | Freq: Once | INTRAVENOUS | Status: AC
  Administered 2018-10-10: 0.25 mg via INTRAVENOUS

## 2018-10-10 MED ORDER — SODIUM CHLORIDE 0.9 % IV SOLN
20.0000 mg | Freq: Once | INTRAVENOUS | Status: AC
  Administered 2018-10-10: 20 mg via INTRAVENOUS
  Filled 2018-10-10: qty 2

## 2018-10-10 MED ORDER — SODIUM CHLORIDE 0.9 % IV SOLN
20.0000 mg | Freq: Once | INTRAVENOUS | Status: AC
  Administered 2018-10-10: 20 mg via INTRAVENOUS
  Filled 2018-10-10: qty 20

## 2018-10-10 MED ORDER — DIPHENHYDRAMINE HCL 50 MG/ML IJ SOLN
50.0000 mg | Freq: Once | INTRAMUSCULAR | Status: AC
  Administered 2018-10-10: 50 mg via INTRAVENOUS

## 2018-10-10 MED ORDER — DEXAMETHASONE SODIUM PHOSPHATE 10 MG/ML IJ SOLN
INTRAMUSCULAR | Status: AC
  Filled 2018-10-10: qty 1

## 2018-10-10 MED ORDER — PALONOSETRON HCL INJECTION 0.25 MG/5ML
INTRAVENOUS | Status: AC
  Filled 2018-10-10: qty 5

## 2018-10-10 MED ORDER — SODIUM CHLORIDE 0.9 % IV SOLN
285.0000 mg | Freq: Once | INTRAVENOUS | Status: AC
  Administered 2018-10-10: 290 mg via INTRAVENOUS
  Filled 2018-10-10: qty 29

## 2018-10-10 MED ORDER — SODIUM CHLORIDE 0.9 % IV SOLN
Freq: Once | INTRAVENOUS | Status: AC
  Administered 2018-10-10: 12:00:00 via INTRAVENOUS
  Filled 2018-10-10: qty 250

## 2018-10-10 MED ORDER — DIPHENHYDRAMINE HCL 50 MG/ML IJ SOLN
INTRAMUSCULAR | Status: AC
  Filled 2018-10-10: qty 1

## 2018-10-10 MED ORDER — SODIUM CHLORIDE 0.9 % IV SOLN
45.0000 mg/m2 | Freq: Once | INTRAVENOUS | Status: AC
  Administered 2018-10-10: 90 mg via INTRAVENOUS
  Filled 2018-10-10: qty 15

## 2018-10-10 MED ORDER — FAMOTIDINE IN NACL 20-0.9 MG/50ML-% IV SOLN: 20.0000 mg | Freq: Once | INTRAVENOUS | Status: DC

## 2018-10-10 NOTE — Telephone Encounter (Signed)
Scheduled appt per 3/9 los.  Printed calendar and avs.  Gave patient the number to central radiology.

## 2018-10-10 NOTE — Patient Instructions (Signed)
    Cancer Center Discharge Instructions for Patients Receiving Chemotherapy  Today you received the following chemotherapy agents Taxol and Carboplatin   To help prevent nausea and vomiting after your treatment, we encourage you to take your nausea medication as directed.    If you develop nausea and vomiting that is not controlled by your nausea medication, call the clinic.   BELOW ARE SYMPTOMS THAT SHOULD BE REPORTED IMMEDIATELY:  *FEVER GREATER THAN 100.5 F  *CHILLS WITH OR WITHOUT FEVER  NAUSEA AND VOMITING THAT IS NOT CONTROLLED WITH YOUR NAUSEA MEDICATION  *UNUSUAL SHORTNESS OF BREATH  *UNUSUAL BRUISING OR BLEEDING  TENDERNESS IN MOUTH AND THROAT WITH OR WITHOUT PRESENCE OF ULCERS  *URINARY PROBLEMS  *BOWEL PROBLEMS  UNUSUAL RASH Items with * indicate a potential emergency and should be followed up as soon as possible.  Feel free to call the clinic should you have any questions or concerns. The clinic phone number is [phone].  Please show the CHEMO ALERT CARD at check-in to the Emergency Department and triage nurse.   

## 2018-10-10 NOTE — Progress Notes (Signed)
[facility] Telephone:([phone]) [phone]   [phone]  OFFICE PROGRESS NOTE  [name], MD [address] [place] [address]  DIAGNOSIS: stage IIIB (T1c, N3, M0)non-small cell lung cancer, adenocarcinoma presented with right upper lobe lung mass in addition to mediastinal and bilateral supraclavicular lymphadenopathy diagnosed in January 2020. PD-L1: 10% Guardant 360 molecular studies showed no actionable mutation.  PRIOR THERAPY: None  CURRENT THERAPY: Concurrent chemoradiation with weekly carboplatin for AUC of 2 and paclitaxel 45 mg/M2.  Status post 5 cycles.  INTERVAL HISTORY: Brian [name] 58 y.o. male returns to the clinic today for follow-up visit accompanied by his wife.  The patient is feeling fine today with no concerning complaints except for mild odynophagia.  He is tolerating his course of concurrent chemoradiation fairly well.  He denied having any chest pain, shortness of breath, cough or hemoptysis.  He denied having any fever or chills.  He has no nausea, vomiting, diarrhea or constipation.  He has no headache or visual changes.  He is here today for evaluation before starting cycle #6 of his chemotherapy.  MEDICAL HISTORY: Past Medical History:  Diagnosis Date  . Diabetes mellitus without complication ([facility])   . Hypertension   . Subarachnoid hemorrhage ([facility])     ALLERGIES:  has No Known Allergies.  MEDICATIONS:  Current Outpatient Medications  Medication Sig Dispense Refill  . amLODipine (NORVASC) 5 MG tablet Take by mouth.    Marland Kitchen atorvastatin (LIPITOR) 20 MG tablet Take by mouth.    . metFORMIN (GLUCOPHAGE) 500 MG tablet Take by mouth.    . prochlorperazine (COMPAZINE) 10 MG tablet TAKE 1 TABLET (10 MG TOTAL) BY MOUTH EVERY 6 (SIX) HOURS AS NEEDED FOR NAUSEA OR VOMITING. 30 tablet 0  . sucralfate (CARAFATE) 1 GM/10ML suspension Take 10 mLs (1 g total) by mouth 4 (four) times daily -  with meals and at bedtime. 420 mL 1    . venlafaxine (EFFEXOR) 37.5 MG tablet TAKE 1 TABLET ONCE DAILY.     No current facility-administered medications for this visit.     SURGICAL HISTORY:  Past Surgical History:  Procedure Laterality Date  . LEG SURGERY  age 89    left leg, from fracture    REVIEW OF SYSTEMS:  A comprehensive review of systems was negative except for: Gastrointestinal: positive for odynophagia   PHYSICAL EXAMINATION: General appearance: alert, cooperative and no distress Head: Normocephalic, without obvious abnormality, atraumatic Neck: no adenopathy, no JVD, supple, symmetrical, trachea midline and thyroid not enlarged, symmetric, no tenderness/mass/nodules Lymph nodes: Cervical, supraclavicular, and axillary nodes normal. Resp: clear to auscultation bilaterally Back: symmetric, no curvature. ROM normal. No CVA tenderness. Cardio: regular rate and rhythm, S1, S2 normal, no murmur, click, rub or gallop GI: soft, non-tender; bowel sounds normal; no masses,  no organomegaly Extremities: extremities normal, atraumatic, no cyanosis or edema  ECOG PERFORMANCE STATUS: 1 - Symptomatic but completely ambulatory  Blood pressure 121/84, pulse (!) 115, temperature 98.2 F (36.8 C), temperature source Oral, resp. rate 18, height '5\' 8"'  (1.727 m), weight 186 lb (84.4 kg), SpO2 99 %.  LABORATORY DATA: Lab Results  Component Value Date   WBC 4.5 10/10/2018   HGB 12.4 (L) 10/10/2018   HCT 37.3 (L) 10/10/2018   MCV 92.1 10/10/2018   PLT 132 (L) 10/10/2018      Chemistry      Component Value Date/Time   NA 140 10/10/2018 0918   K 3.8 10/10/2018 0037  CL 108 10/10/2018 0918   CO2 25 10/10/2018 0918   BUN 19 10/10/2018 0918   CREATININE 0.66 10/10/2018 0918   CREATININE 0.81 10/03/2018 0808      Component Value Date/Time   CALCIUM 8.9 10/10/2018 0918   ALKPHOS 58 10/10/2018 0918   AST 25 10/10/2018 0918   AST 27 10/03/2018 0808   ALT 49 (H) 10/10/2018 0918   ALT 48 (H) 10/03/2018 0808   BILITOT  0.6 10/10/2018 0918   BILITOT 0.4 10/03/2018 0808       RADIOGRAPHIC STUDIES: No results found.  ASSESSMENT AND PLAN: This is a very pleasant 58 years old white male with a stage IIIb non-small cell lung cancer, adenocarcinoma diagnosed in January 2020 with PDL 1 expression of 10% and no actionable mutations on Guardant 360 blood test. The patient is currently undergoing a course of concurrent chemoradiation with weekly carboplatin and paclitaxel status post 5 cycles.   He has been tolerating this treatment well with no concerning adverse effects. I recommended for the patient to proceed with cycle #6 today as scheduled. I will see him back for follow-up visit in 1 months for evaluation after repeating CT scan of the chest for restaging of his disease. The patient was advised to call immediately if he has any concerning symptoms in the interval. The patient voices understanding of current disease status and treatment options and is in agreement with the current care plan. All questions were answered. The patient knows to call the clinic with any problems, questions or concerns. We can certainly see the patient much sooner if necessary.  I spent 10 minutes counseling the patient face to face. The total time spent in the appointment was 15 minutes.  Disclaimer: This note was dictated with voice recognition software. Similar sounding words can inadvertently be transcribed and may not be corrected upon review.

## 2018-10-11 ENCOUNTER — Ambulatory Visit
Admission: RE | Admit: 2018-10-11 | Discharge: 2018-10-11 | Disposition: A | Discharge status: Home / Self Care | Payer: BLUE CROSS/BLUE SHIELD | Source: Ambulatory Visit | Attending: Radiation Oncology | Admitting: Radiation Oncology

## 2018-10-11 DIAGNOSIS — C3491 Malignant neoplasm of unspecified part of right bronchus or lung: Secondary | ICD-10-CM

## 2018-10-11 DIAGNOSIS — Z510 Encounter for antineoplastic radiation therapy: Secondary | ICD-10-CM | POA: Diagnosis not present

## 2018-10-11 MED ORDER — SONAFINE EX EMUL
1.0000 "application " | Freq: Two times a day (BID) | CUTANEOUS | Status: DC
  Administered 2018-10-11: 1 via TOPICAL

## 2018-10-12 ENCOUNTER — Ambulatory Visit
Admission: RE | Admit: 2018-10-12 | Discharge: 2018-10-12 | Disposition: A | Discharge status: Home / Self Care | Payer: BLUE CROSS/BLUE SHIELD | Source: Ambulatory Visit | Attending: Radiation Oncology | Admitting: Radiation Oncology

## 2018-10-12 DIAGNOSIS — Z510 Encounter for antineoplastic radiation therapy: Secondary | ICD-10-CM | POA: Diagnosis not present

## 2018-10-13 ENCOUNTER — Ambulatory Visit
Admission: RE | Admit: 2018-10-13 | Discharge: 2018-10-13 | Disposition: A | Discharge status: Home / Self Care | Payer: BLUE CROSS/BLUE SHIELD | Source: Ambulatory Visit | Attending: Radiation Oncology | Admitting: Radiation Oncology

## 2018-10-13 DIAGNOSIS — Z510 Encounter for antineoplastic radiation therapy: Secondary | ICD-10-CM | POA: Diagnosis not present

## 2018-10-14 ENCOUNTER — Ambulatory Visit
Admission: RE | Admit: 2018-10-14 | Discharge: 2018-10-14 | Disposition: A | Discharge status: Home / Self Care | Payer: BLUE CROSS/BLUE SHIELD | Source: Ambulatory Visit | Attending: Radiation Oncology | Admitting: Radiation Oncology

## 2018-10-14 ENCOUNTER — Other Ambulatory Visit: Payer: Self-pay

## 2018-10-14 DIAGNOSIS — Z510 Encounter for antineoplastic radiation therapy: Secondary | ICD-10-CM | POA: Diagnosis not present

## 2018-10-17 ENCOUNTER — Other Ambulatory Visit: Payer: BLUE CROSS/BLUE SHIELD

## 2018-10-17 ENCOUNTER — Ambulatory Visit: Payer: BLUE CROSS/BLUE SHIELD

## 2018-10-17 ENCOUNTER — Encounter: Payer: Self-pay | Admitting: Radiation Oncology

## 2018-10-17 NOTE — Progress Notes (Signed)
  Radiation Oncology         [phone]) [phone] ________________________________  Name: Brian [name] MRN: [id number]  Date: 10/17/2018  DOB: 1960/10/29  End of Treatment Note  Diagnosis:   Adenocarcinoma of right lung, stage IIIB (cT1c, cN3, cM0)     Indication for treatment:  Curative       Radiation treatment dates:   09/05/18-10/14/18  Site/dose:   Right lung; 30 fractions of 2 Gy for a total of 60 Gy  Beams/energy:   Photon IMRT; 6X  Narrative: The patient tolerated radiation treatment relatively well.     Pt denied cough, hemoptysis, and SOB throughout treatments. Towards the end of treatment, pt reported intermittent difficulty with swallowing, esophagitis and mild to severe fatigue. Pt denied radiation-related skin changes and overall the pt was without complaints.   Plan: The patient has completed radiation treatment. The patient will return to radiation oncology clinic for routine followup in one month. I advised them to call or return sooner if they have any questions or concerns related to their recovery or treatment.  -----------------------------------  Blair Promise, PhD, MD  This document serves as a record of services personally performed by Gery Pray, MD. It was created on his behalf by Mary-Margaret Loma Messing, a trained medical scribe. The creation of this record is based on the scribe's personal observations and the provider's statements to them. This document has been checked and approved by the attending provider.

## 2018-11-03 ENCOUNTER — Other Ambulatory Visit: Payer: Self-pay | Admitting: Radiation Oncology

## 2018-11-03 MED ORDER — SUCRALFATE 1 GM/10ML PO SUSP: 1.0000 g | Freq: Three times a day (TID) | ORAL | 1 refills | Status: DC

## 2018-11-07 ENCOUNTER — Inpatient Hospital Stay: Payer: BLUE CROSS/BLUE SHIELD | Attending: Internal Medicine

## 2018-11-07 ENCOUNTER — Other Ambulatory Visit: Payer: Self-pay

## 2018-11-07 ENCOUNTER — Encounter (HOSPITAL_COMMUNITY): Payer: Self-pay

## 2018-11-07 ENCOUNTER — Other Ambulatory Visit: Payer: BLUE CROSS/BLUE SHIELD

## 2018-11-07 ENCOUNTER — Ambulatory Visit (HOSPITAL_COMMUNITY)
Admission: RE | Admit: 2018-11-07 | Discharge: 2018-11-07 | Disposition: A | Discharge status: Home / Self Care | Payer: BLUE CROSS/BLUE SHIELD | Source: Ambulatory Visit | Attending: Internal Medicine | Admitting: Internal Medicine

## 2018-11-07 DIAGNOSIS — J45909 Unspecified asthma, uncomplicated: Secondary | ICD-10-CM | POA: Diagnosis not present

## 2018-11-07 DIAGNOSIS — C3490 Malignant neoplasm of unspecified part of unspecified bronchus or lung: Secondary | ICD-10-CM | POA: Diagnosis present

## 2018-11-07 DIAGNOSIS — C3401 Malignant neoplasm of right main bronchus: Secondary | ICD-10-CM | POA: Insufficient documentation

## 2018-11-07 DIAGNOSIS — I10 Essential (primary) hypertension: Secondary | ICD-10-CM | POA: Diagnosis not present

## 2018-11-07 DIAGNOSIS — Z7984 Long term (current) use of oral hypoglycemic drugs: Secondary | ICD-10-CM | POA: Insufficient documentation

## 2018-11-07 DIAGNOSIS — Z79899 Other long term (current) drug therapy: Secondary | ICD-10-CM | POA: Insufficient documentation

## 2018-11-07 DIAGNOSIS — R0781 Pleurodynia: Secondary | ICD-10-CM | POA: Insufficient documentation

## 2018-11-07 DIAGNOSIS — Z5112 Encounter for antineoplastic immunotherapy: Secondary | ICD-10-CM | POA: Insufficient documentation

## 2018-11-07 DIAGNOSIS — C778 Secondary and unspecified malignant neoplasm of lymph nodes of multiple regions: Secondary | ICD-10-CM | POA: Diagnosis not present

## 2018-11-07 DIAGNOSIS — E119 Type 2 diabetes mellitus without complications: Secondary | ICD-10-CM | POA: Diagnosis not present

## 2018-11-07 HISTORY — DX: Malignant (primary) neoplasm, unspecified: C80.1

## 2018-11-07 HISTORY — DX: Unspecified asthma, uncomplicated: J45.909

## 2018-11-07 LAB — CBC WITH DIFFERENTIAL (CANCER CENTER ONLY)
Abs Immature Granulocytes: 0.09 10*3/uL — ABNORMAL HIGH (ref 0.00–0.07)
Basophils Absolute: 0.1 10*3/uL (ref 0.0–0.1)
Basophils Relative: 1 %
Eosinophils Absolute: 0.2 10*3/uL (ref 0.0–0.5)
Eosinophils Relative: 2 %
HCT: 37.4 % — ABNORMAL LOW (ref 39.0–52.0)
Hemoglobin: 12.5 g/dL — ABNORMAL LOW (ref 13.0–17.0)
Immature Granulocytes: 1 %
Lymphocytes Relative: 9 %
Lymphs Abs: 0.6 10*3/uL — ABNORMAL LOW (ref 0.7–4.0)
MCH: 31.7 pg (ref 26.0–34.0)
MCHC: 33.4 g/dL (ref 30.0–36.0)
MCV: 94.9 fL (ref 80.0–100.0)
Monocytes Absolute: 1.5 10*3/uL — ABNORMAL HIGH (ref 0.1–1.0)
Monocytes Relative: 24 %
Neutro Abs: 4 10*3/uL (ref 1.7–7.7)
Neutrophils Relative %: 63 %
Platelet Count: 178 10*3/uL (ref 150–400)
RBC: 3.94 MIL/uL — ABNORMAL LOW (ref 4.22–5.81)
RDW: 15.4 % (ref 11.5–15.5)
WBC Count: 6.4 10*3/uL (ref 4.0–10.5)
nRBC: 0 % (ref 0.0–0.2)

## 2018-11-07 LAB — CMP (CANCER CENTER ONLY)
ALT: 44 U/L (ref 0–44)
AST: 28 U/L (ref 15–41)
Albumin: 3.7 g/dL (ref 3.5–5.0)
Alkaline Phosphatase: 74 U/L (ref 38–126)
Anion gap: 9 (ref 5–15)
BUN: 15 mg/dL (ref 6–20)
CO2: 24 mmol/L (ref 22–32)
Calcium: 9 mg/dL (ref 8.9–10.3)
Chloride: 107 mmol/L (ref 98–111)
Creatinine: 0.79 mg/dL (ref 0.61–1.24)
GFR, Est AFR Am: >60 mL/min (ref >60)
GFR, Estimated: >60 mL/min (ref >60)
Glucose, Bld: 87 mg/dL (ref 70–99)
Potassium: 4.4 mmol/L (ref 3.5–5.1)
Sodium: 140 mmol/L (ref 135–145)
Total Bilirubin: 0.4 mg/dL (ref 0.3–1.2)
Total Protein: 6.4 g/dL — ABNORMAL LOW (ref 6.5–8.1)

## 2018-11-07 MED ORDER — SODIUM CHLORIDE (PF) 0.9 % IJ SOLN
INTRAMUSCULAR | Status: AC
  Filled 2018-11-07: qty 50

## 2018-11-07 MED ORDER — IOHEXOL 300 MG/ML  SOLN
75.0000 mL | Freq: Once | INTRAMUSCULAR | Status: AC | PRN
  Administered 2018-11-07: 75 mL via INTRAVENOUS

## 2018-11-08 ENCOUNTER — Other Ambulatory Visit: Payer: Self-pay

## 2018-11-08 MED ORDER — SUCRALFATE 1 GM/10ML PO SUSP: 1.0000 g | Freq: Three times a day (TID) | ORAL | 1 refills | Status: DC

## 2018-11-10 ENCOUNTER — Inpatient Hospital Stay (HOSPITAL_BASED_OUTPATIENT_CLINIC_OR_DEPARTMENT_OTHER): Payer: BLUE CROSS/BLUE SHIELD | Admitting: Internal Medicine

## 2018-11-10 ENCOUNTER — Other Ambulatory Visit: Payer: Self-pay

## 2018-11-10 ENCOUNTER — Encounter: Payer: Self-pay | Admitting: Internal Medicine

## 2018-11-10 VITALS — BP 117/85 | HR 109 | Temp 98.3°F | Resp 18 | Ht 68.0 in | Wt 188.7 lb

## 2018-11-10 DIAGNOSIS — J45909 Unspecified asthma, uncomplicated: Secondary | ICD-10-CM

## 2018-11-10 DIAGNOSIS — Z7189 Other specified counseling: Secondary | ICD-10-CM

## 2018-11-10 DIAGNOSIS — I10 Essential (primary) hypertension: Secondary | ICD-10-CM

## 2018-11-10 DIAGNOSIS — C3491 Malignant neoplasm of unspecified part of right bronchus or lung: Secondary | ICD-10-CM

## 2018-11-10 DIAGNOSIS — Z7984 Long term (current) use of oral hypoglycemic drugs: Secondary | ICD-10-CM

## 2018-11-10 DIAGNOSIS — C778 Secondary and unspecified malignant neoplasm of lymph nodes of multiple regions: Secondary | ICD-10-CM

## 2018-11-10 DIAGNOSIS — E119 Type 2 diabetes mellitus without complications: Secondary | ICD-10-CM

## 2018-11-10 DIAGNOSIS — Z5112 Encounter for antineoplastic immunotherapy: Secondary | ICD-10-CM | POA: Diagnosis not present

## 2018-11-10 DIAGNOSIS — C3401 Malignant neoplasm of right main bronchus: Secondary | ICD-10-CM

## 2018-11-10 DIAGNOSIS — Z79899 Other long term (current) drug therapy: Secondary | ICD-10-CM

## 2018-11-10 NOTE — Progress Notes (Signed)
[facility] Telephone:(336) [phone]   Fax:([phone]) [phone]  OFFICE PROGRESS NOTE  [name], MD [address] [place] [address]  DIAGNOSIS: stage IIIB (T1c, N3, M0)non-small cell lung cancer, adenocarcinoma presented with right upper lobe lung mass in addition to mediastinal and bilateral supraclavicular lymphadenopathy diagnosed in January 2020. PD-L1: 10% Guardant 360 molecular studies showed no actionable mutation.  PRIOR THERAPY: Concurrent chemoradiation with weekly carboplatin for AUC of 2 and paclitaxel 45 mg/M2.  Status post 6 cycles.  Last dose was given on October 10, 2018 with stable disease.  CURRENT THERAPY:  Consolidation treatment with immunotherapy with Imfinzi 10 mg/KG every 2 weeks.  First dose November 17, 2018.  INTERVAL HISTORY: Emilio Baylock 58 y.o. male returns to the clinic today for follow-up visit.  The patient is feeling fine today with no concerning complaints.  He gained few pounds since his last visit.  He denied having any current chest pain, shortness of breath, cough or hemoptysis.  His odynophagia has improved.  He denied having any nausea, vomiting, diarrhea or constipation.  He denied having any headache or visual changes.  The patient has no fever or chills.  He had repeat CT scan of the chest performed recently and he is here for evaluation and discussion of his scan results and treatment options.  MEDICAL HISTORY: Past Medical History:  Diagnosis Date   Asthma    Cancer ([facility])    Diabetes mellitus without complication ([facility])    Hypertension    Subarachnoid hemorrhage ([facility])     ALLERGIES:  has No Known Allergies.  MEDICATIONS:  Current Outpatient Medications  Medication Sig Dispense Refill   amLODipine (NORVASC) 5 MG tablet Take by mouth.     atorvastatin (LIPITOR) 20 MG tablet Take by mouth.     metFORMIN (GLUCOPHAGE) 500 MG tablet Take by mouth.     prochlorperazine (COMPAZINE) 10 MG tablet TAKE 1  TABLET (10 MG TOTAL) BY MOUTH EVERY 6 (SIX) HOURS AS NEEDED FOR NAUSEA OR VOMITING. 30 tablet 0   sucralfate (CARAFATE) 1 GM/10ML suspension Take 10 mLs (1 g total) by mouth 4 (four) times daily -  with meals and at bedtime. 3600 mL 1   venlafaxine (EFFEXOR) 37.5 MG tablet TAKE 1 TABLET ONCE DAILY.     No current facility-administered medications for this visit.     SURGICAL HISTORY:  Past Surgical History:  Procedure Laterality Date   LEG SURGERY  age 28    left leg, from fracture    REVIEW OF SYSTEMS:  Constitutional: negative Eyes: negative Ears, nose, mouth, throat, and face: negative Respiratory: negative Cardiovascular: negative Gastrointestinal: negative Genitourinary:negative Integument/breast: negative Hematologic/lymphatic: negative Musculoskeletal:negative Neurological: negative Behavioral/Psych: negative Endocrine: negative Allergic/Immunologic: negative   PHYSICAL EXAMINATION: General appearance: alert, cooperative and no distress Head: Normocephalic, without obvious abnormality, atraumatic Neck: no adenopathy, no JVD, supple, symmetrical, trachea midline and thyroid not enlarged, symmetric, no tenderness/mass/nodules Lymph nodes: Cervical, supraclavicular, and axillary nodes normal. Resp: clear to auscultation bilaterally Back: symmetric, no curvature. ROM normal. No CVA tenderness. Cardio: regular rate and rhythm, S1, S2 normal, no murmur, click, rub or gallop GI: soft, non-tender; bowel sounds normal; no masses,  no organomegaly Extremities: extremities normal, atraumatic, no cyanosis or edema Neurologic: Alert and oriented X 3, normal strength and tone. Normal symmetric reflexes. Normal coordination and gait  ECOG PERFORMANCE STATUS: 1 - Symptomatic but completely ambulatory  Blood pressure 117/85, pulse (!) 109, temperature 98.3 F (36.8 C),  temperature source Oral, resp. rate 18, height _0  (1.727 m), weight 188 lb 11.2 oz (85.6 kg), SpO2 98  %.  LABORATORY DATA: Lab Results  Component Value Date   WBC 6.4 11/07/2018   HGB 12.5 (L) 11/07/2018   HCT 37.4 (L) 11/07/2018   MCV 94.9 11/07/2018   PLT 178 11/07/2018      Chemistry      Component Value Date/Time   NA 140 11/07/2018 0937   K 4.4 11/07/2018 0937   CL 107 11/07/2018 0937   CO2 24 11/07/2018 0937   BUN 15 11/07/2018 0937   CREATININE 0.79 11/07/2018 0937      Component Value Date/Time   CALCIUM 9.0 11/07/2018 0937   ALKPHOS 74 11/07/2018 0937   AST 28 11/07/2018 0937   ALT 44 11/07/2018 0937   BILITOT 0.4 11/07/2018 0937       RADIOGRAPHIC STUDIES: Ct Chest W Contrast  Result Date: 11/07/2018 CLINICAL DATA:  Non-small-cell lung cancer. EXAM: CT CHEST WITH CONTRAST TECHNIQUE: Multidetector CT imaging of the chest was performed during intravenous contrast administration. CONTRAST:  59m OMNIPAQUE IOHEXOL 300 MG/ML  SOLN COMPARISON:  PET-CT 08/11/2018.  Chest CT 07/25/2018. FINDINGS: Cardiovascular: The heart size is normal. No substantial pericardial effusion. Coronary artery calcification is evident. Atherosclerotic calcification is noted in the wall of the thoracic aorta. Mediastinum/Nodes: 9 mm short axis right paratracheal node (57/2) was 10 mm on 07/25/2018. 12 mm short axis right hilar lymph node was not well seen on previous noncontrast studies. 9 mm short axis right paratracheal node (51/2) was 8 mm short axis on previous CT. There is no axillary lymphadenopathy. 13 mm short axis left supraclavicular node (7/2) was 15 mm short axis on previous PET-CT. Index right supraclavicular node measured on previous PET-CT at 11 mm is stable at 11 mm today (14/2). The esophagus has normal imaging features. Lungs/Pleura: Centrilobular emphsyema noted. The central tracheobronchial airways are patent. 2.8 cm right upper lobe pulmonary nodule is stable in size comparing to 07/25/2018. Subsegmental atelectasis or linear scarring noted anteriorly in the right upper lobe, new  in the interval. No new suspicious pulmonary nodule or mass. No pleural effusion. Upper Abdomen: Unremarkable. Musculoskeletal: No worrisome lytic or sclerotic osseous abnormality. IMPRESSION: 1. Stable size of the right upper lobe pulmonary nodule, measuring up to 2.8 cm diameter. 2. No substantial change in bilateral supraclavicular, mediastinal, and right hilar lymphadenopathy, seen to be hypermetabolic on previous PET imaging. 3.  Aortic Atherosclerois (ICD10-170.0) 4.  Emphysema. (([id number]) Electronically Signed   By: EMisty StanleyM.D.   On: 11/07/2018 12:58    ASSESSMENT AND PLAN: This is a very pleasant 58years old white male with a stage IIIb non-small cell lung cancer, adenocarcinoma diagnosed in January 2020 with PDL 1 expression of 10% and no actionable mutations on Guardant 360 blood test. The patient underwent a course of concurrent chemoradiation with weekly carboplatin and paclitaxel status post 6 cycles.  He tolerated this course fairly well except for fatigue and odynophagia which significantly improved. The patient had repeat CT scan of the chest performed recently.  I personally and independently reviewed the scan images and discussed the result and showed the images to the patient today. His scan showed a stable disease with no concerning findings for progression. I discussed with the patient his treatment options including observation versus consolidation treatment with immunotherapy with Imfinzi. I discussed with the patient the adverse effect of the immunotherapy including but not limited to immunotherapy mediated skin rash,  diarrhea, inflammation of the lung, kidney, liver, thyroid or other endocrine dysfunction. The patient would like to proceed with the immunotherapy and he is expected to start the first cycle of this treatment next week. He will come back for follow-up visit in 3 weeks for evaluation with the start of cycle #2. He was advised to call immediately if he has  any concerning symptoms in the interval. The patient voices understanding of current disease status and treatment options and is in agreement with the current care plan. All questions were answered. The patient knows to call the clinic with any problems, questions or concerns. We can certainly see the patient much sooner if necessary.  I spent 20 minutes counseling the patient face to face. The total time spent in the appointment was 25 minutes.  Disclaimer: This note was dictated with voice recognition software. Similar sounding words can inadvertently be transcribed and may not be corrected upon review.

## 2018-11-11 ENCOUNTER — Telehealth: Payer: Self-pay | Admitting: Internal Medicine

## 2018-11-11 NOTE — Telephone Encounter (Signed)
Tried to reach °

## 2018-11-17 ENCOUNTER — Inpatient Hospital Stay: Payer: BLUE CROSS/BLUE SHIELD

## 2018-11-17 ENCOUNTER — Ambulatory Visit: Payer: Self-pay | Admitting: Radiation Oncology

## 2018-11-17 ENCOUNTER — Other Ambulatory Visit: Payer: Self-pay

## 2018-11-17 ENCOUNTER — Telehealth: Payer: Self-pay

## 2018-11-17 ENCOUNTER — Ambulatory Visit: Payer: BLUE CROSS/BLUE SHIELD | Admitting: Pulmonary Disease

## 2018-11-17 VITALS — BP 123/80 | HR 99 | Temp 98.5°F | Resp 18

## 2018-11-17 DIAGNOSIS — C3401 Malignant neoplasm of right main bronchus: Secondary | ICD-10-CM | POA: Diagnosis not present

## 2018-11-17 DIAGNOSIS — C3491 Malignant neoplasm of unspecified part of right bronchus or lung: Secondary | ICD-10-CM

## 2018-11-17 LAB — CBC WITH DIFFERENTIAL (CANCER CENTER ONLY)
Abs Immature Granulocytes: 0.05 10*3/uL (ref 0.00–0.07)
Basophils Absolute: 0.1 10*3/uL (ref 0.0–0.1)
Basophils Relative: 1 %
Eosinophils Absolute: 0.4 10*3/uL (ref 0.0–0.5)
Eosinophils Relative: 4 %
HCT: 38.9 % — ABNORMAL LOW (ref 39.0–52.0)
Hemoglobin: 13.1 g/dL (ref 13.0–17.0)
Immature Granulocytes: 1 %
Lymphocytes Relative: 8 %
Lymphs Abs: 0.7 10*3/uL (ref 0.7–4.0)
MCH: 32.2 pg (ref 26.0–34.0)
MCHC: 33.7 g/dL (ref 30.0–36.0)
MCV: 95.6 fL (ref 80.0–100.0)
Monocytes Absolute: 1.5 10*3/uL — ABNORMAL HIGH (ref 0.1–1.0)
Monocytes Relative: 17 %
Neutro Abs: 6.1 10*3/uL (ref 1.7–7.7)
Neutrophils Relative %: 69 %
Platelet Count: 212 10*3/uL (ref 150–400)
RBC: 4.07 MIL/uL — ABNORMAL LOW (ref 4.22–5.81)
RDW: 14.8 % (ref 11.5–15.5)
WBC Count: 8.8 10*3/uL (ref 4.0–10.5)
nRBC: 0 % (ref 0.0–0.2)

## 2018-11-17 LAB — TSH: TSH: 0.839 u[IU]/mL (ref 0.320–4.118)

## 2018-11-17 LAB — CMP (CANCER CENTER ONLY)
ALT: 46 U/L — ABNORMAL HIGH (ref 0–44)
AST: 27 U/L (ref 15–41)
Albumin: 3.8 g/dL (ref 3.5–5.0)
Alkaline Phosphatase: 81 U/L (ref 38–126)
Anion gap: 12 (ref 5–15)
BUN: 19 mg/dL (ref 6–20)
CO2: 20 mmol/L — ABNORMAL LOW (ref 22–32)
Calcium: 9 mg/dL (ref 8.9–10.3)
Chloride: 109 mmol/L (ref 98–111)
Creatinine: 0.84 mg/dL (ref 0.61–1.24)
GFR, Est AFR Am: >60 mL/min (ref >60)
GFR, Estimated: >60 mL/min (ref >60)
Glucose, Bld: 113 mg/dL — ABNORMAL HIGH (ref 70–99)
Potassium: 4.1 mmol/L (ref 3.5–5.1)
Sodium: 141 mmol/L (ref 135–145)
Total Bilirubin: 0.3 mg/dL (ref 0.3–1.2)
Total Protein: 6.7 g/dL (ref 6.5–8.1)

## 2018-11-17 MED ORDER — SODIUM CHLORIDE 0.9 % IV SOLN
10.0000 mg/kg | Freq: Once | INTRAVENOUS | Status: AC
  Administered 2018-11-17: 14:00:00 860 mg via INTRAVENOUS
  Filled 2018-11-17: qty 7.2

## 2018-11-17 MED ORDER — SODIUM CHLORIDE 0.9 % IV SOLN
Freq: Once | INTRAVENOUS | Status: AC
  Administered 2018-11-17: 14:00:00 via INTRAVENOUS
  Filled 2018-11-17: qty 250

## 2018-11-17 NOTE — Telephone Encounter (Signed)
Contacted pt to check on any lingering side effects from radiation. Pt reports he is doing well and is agreeable to moving appt to July 2020 in light of Covid. Pt encouraged to contact Rad Onc with any further questions/concerns. Pt verbalized understanding and agreement. Loma Sousa, RN BSN

## 2018-11-17 NOTE — Patient Instructions (Signed)
Durvalumab injection What is this medicine? DURVALUMAB (dur VAL ue mab) is a monoclonal antibody. It is used to treat urothelial cancer and lung cancer. This medicine may be used for other purposes; ask your health care provider or pharmacist if you have questions. COMMON BRAND NAME(S): IMFINZI What should I tell my health care provider before I take this medicine? They need to know if you have any of these conditions: -diabetes -immune system problems -infection -inflammatory bowel disease -kidney disease -liver disease -lung or breathing disease -lupus -organ transplant -stomach or intestine problems -thyroid disease -an unusual or allergic reaction to durvalumab, other medicines, foods, dyes, or preservatives -pregnant or trying to get pregnant -breast-feeding How should I use this medicine? This medicine is for infusion into a vein. It is given by a health care professional in a hospital or clinic setting. A special MedGuide will be given to you before each treatment. Be sure to read this information carefully each time. Talk to your pediatrician regarding the use of this medicine in children. Special care may be needed. Overdosage: If you think you have taken too much of this medicine contact a poison control center or emergency room at once. NOTE: This medicine is only for you. Do not share this medicine with others. What if I miss a dose? It is important not to miss your dose. Call your doctor or health care professional if you are unable to keep an appointment. What may interact with this medicine? Interactions have not been studied. This list may not describe all possible interactions. Give your health care provider a list of all the medicines, herbs, non-prescription drugs, or dietary supplements you use. Also tell them if you smoke, drink alcohol, or use illegal drugs. Some items may interact with your medicine. What should I watch for while using this medicine? This drug  may make you feel generally unwell. Continue your course of treatment even though you feel ill unless your doctor tells you to stop. You may need blood work done while you are taking this medicine. Do not become pregnant while taking this medicine or for 3 months after stopping it. Women should inform their doctor if they wish to become pregnant or think they might be pregnant. There is a potential for serious side effects to an unborn child. Talk to your health care professional or pharmacist for more information. Do not breast-feed an infant while taking this medicine or for 3 months after stopping it. What side effects may I notice from receiving this medicine? Side effects that you should report to your doctor or health care professional as soon as possible: -allergic reactions like skin rash, itching or hives, swelling of the face, lips, or tongue -black, tarry stools -bloody or watery diarrhea -breathing problems -change in emotions or moods -change in sex drive -changes in vision -chest pain or chest tightness -chills -confusion -cough -facial flushing -fever -headache -signs and symptoms of high blood sugar such as dizziness; dry mouth; dry skin; fruity breath; nausea; stomach pain; increased hunger or thirst; increased urination -signs and symptoms of liver injury like dark yellow or brown urine; general ill feeling or flu-like symptoms; light-colored stools; loss of appetite; nausea; right upper belly pain; unusually weak or tired; yellowing of the eyes or skin -stomach pain -trouble passing urine or change in the amount of urine -weight gain or weight loss Side effects that usually do not require medical attention (report these to your doctor or health care professional if they continue or are   bothersome): -bone pain -constipation -loss of appetite -muscle pain -nausea -swelling of the ankles, feet, hands -tiredness This list may not describe all possible side effects. Call  your doctor for medical advice about side effects. You may report side effects to FDA at 1-800-FDA-1088. Where should I keep my medicine? This drug is given in a hospital or clinic and will not be stored at home. NOTE: This sheet is a summary. It may not cover all possible information. If you have questions about this medicine, talk to your doctor, pharmacist, or health care provider.  2019 Elsevier/Gold Standard (2016-09-29 19:25:04)

## 2018-11-18 ENCOUNTER — Telehealth: Payer: Self-pay | Admitting: *Deleted

## 2018-11-18 NOTE — Telephone Encounter (Signed)
TCT patient regarding 1st time Imfinzi treatment he received yesterday. No answer but was able to leave vm message on identified phone #.

## 2018-11-21 ENCOUNTER — Telehealth: Payer: Self-pay | Admitting: *Deleted

## 2018-11-21 ENCOUNTER — Ambulatory Visit: Payer: BLUE CROSS/BLUE SHIELD | Admitting: Radiation Oncology

## 2018-11-21 NOTE — Telephone Encounter (Signed)
CALLED PATIENT TO ALTER FU APPT. PER DR. KINARD REQUEST, SPOKE WITH PATIENT AND HE AGREED TO COME ON 02/20/19 @ 10 AM

## 2018-11-22 ENCOUNTER — Telehealth: Payer: Self-pay | Admitting: Medical Oncology

## 2018-11-22 NOTE — Telephone Encounter (Signed)
Returned call- no answer. 

## 2018-11-24 ENCOUNTER — Telehealth: Payer: Self-pay | Admitting: *Deleted

## 2018-11-24 NOTE — Telephone Encounter (Signed)
Pt called requesting to talk to Dr. Worthy Flank nurse.   Called pt back without answer.  Left message on voice mail asking pt to call office back. Pt's   Phone     [phone].

## 2018-12-01 ENCOUNTER — Other Ambulatory Visit: Payer: Self-pay

## 2018-12-01 ENCOUNTER — Inpatient Hospital Stay (HOSPITAL_BASED_OUTPATIENT_CLINIC_OR_DEPARTMENT_OTHER): Payer: BLUE CROSS/BLUE SHIELD | Admitting: Physician Assistant

## 2018-12-01 ENCOUNTER — Encounter: Payer: Self-pay | Admitting: Physician Assistant

## 2018-12-01 ENCOUNTER — Inpatient Hospital Stay: Payer: BLUE CROSS/BLUE SHIELD

## 2018-12-01 ENCOUNTER — Telehealth: Payer: Self-pay | Admitting: Physician Assistant

## 2018-12-01 VITALS — HR 100

## 2018-12-01 VITALS — BP 122/84 | HR 103 | Temp 98.5°F | Resp 18 | Ht 68.0 in | Wt 190.7 lb

## 2018-12-01 DIAGNOSIS — C3491 Malignant neoplasm of unspecified part of right bronchus or lung: Secondary | ICD-10-CM

## 2018-12-01 DIAGNOSIS — I10 Essential (primary) hypertension: Secondary | ICD-10-CM

## 2018-12-01 DIAGNOSIS — Z5112 Encounter for antineoplastic immunotherapy: Secondary | ICD-10-CM

## 2018-12-01 DIAGNOSIS — Z79899 Other long term (current) drug therapy: Secondary | ICD-10-CM

## 2018-12-01 DIAGNOSIS — Z7984 Long term (current) use of oral hypoglycemic drugs: Secondary | ICD-10-CM

## 2018-12-01 DIAGNOSIS — R0781 Pleurodynia: Secondary | ICD-10-CM | POA: Diagnosis not present

## 2018-12-01 DIAGNOSIS — C3401 Malignant neoplasm of right main bronchus: Secondary | ICD-10-CM | POA: Diagnosis not present

## 2018-12-01 DIAGNOSIS — E119 Type 2 diabetes mellitus without complications: Secondary | ICD-10-CM

## 2018-12-01 DIAGNOSIS — C778 Secondary and unspecified malignant neoplasm of lymph nodes of multiple regions: Secondary | ICD-10-CM | POA: Diagnosis not present

## 2018-12-01 DIAGNOSIS — J45909 Unspecified asthma, uncomplicated: Secondary | ICD-10-CM

## 2018-12-01 LAB — CMP (CANCER CENTER ONLY)
ALT: 38 U/L (ref 0–44)
AST: 23 U/L (ref 15–41)
Albumin: 3.6 g/dL (ref 3.5–5.0)
Alkaline Phosphatase: 95 U/L (ref 38–126)
Anion gap: 10 (ref 5–15)
BUN: 13 mg/dL (ref 6–20)
CO2: 24 mmol/L (ref 22–32)
Calcium: 8.8 mg/dL — ABNORMAL LOW (ref 8.9–10.3)
Chloride: 107 mmol/L (ref 98–111)
Creatinine: 0.85 mg/dL (ref 0.61–1.24)
GFR, Est AFR Am: >60 mL/min (ref >60)
GFR, Estimated: >60 mL/min (ref >60)
Glucose, Bld: 106 mg/dL — ABNORMAL HIGH (ref 70–99)
Potassium: 4.1 mmol/L (ref 3.5–5.1)
Sodium: 141 mmol/L (ref 135–145)
Total Bilirubin: 0.2 mg/dL — ABNORMAL LOW (ref 0.3–1.2)
Total Protein: 6.8 g/dL (ref 6.5–8.1)

## 2018-12-01 LAB — CBC WITH DIFFERENTIAL (CANCER CENTER ONLY)
Abs Immature Granulocytes: 0.04 10*3/uL (ref 0.00–0.07)
Basophils Absolute: 0.1 10*3/uL (ref 0.0–0.1)
Basophils Relative: 1 %
Eosinophils Absolute: 0.5 10*3/uL (ref 0.0–0.5)
Eosinophils Relative: 5 %
HCT: 38.8 % — ABNORMAL LOW (ref 39.0–52.0)
Hemoglobin: 13.2 g/dL (ref 13.0–17.0)
Immature Granulocytes: 1 %
Lymphocytes Relative: 8 %
Lymphs Abs: 0.7 10*3/uL (ref 0.7–4.0)
MCH: 32.3 pg (ref 26.0–34.0)
MCHC: 34 g/dL (ref 30.0–36.0)
MCV: 94.9 fL (ref 80.0–100.0)
Monocytes Absolute: 1.5 10*3/uL — ABNORMAL HIGH (ref 0.1–1.0)
Monocytes Relative: 17 %
Neutro Abs: 5.8 10*3/uL (ref 1.7–7.7)
Neutrophils Relative %: 68 %
Platelet Count: 185 10*3/uL (ref 150–400)
RBC: 4.09 MIL/uL — ABNORMAL LOW (ref 4.22–5.81)
RDW: 13.4 % (ref 11.5–15.5)
WBC Count: 8.5 10*3/uL (ref 4.0–10.5)
nRBC: 0 % (ref 0.0–0.2)

## 2018-12-01 MED ORDER — SODIUM CHLORIDE 0.9 % IV SOLN
10.0000 mg/kg | Freq: Once | INTRAVENOUS | Status: AC
  Administered 2018-12-01: 860 mg via INTRAVENOUS
  Filled 2018-12-01: qty 10

## 2018-12-01 MED ORDER — SODIUM CHLORIDE 0.9 % IV SOLN
Freq: Once | INTRAVENOUS | Status: AC
  Administered 2018-12-01: 14:00:00 via INTRAVENOUS
  Filled 2018-12-01: qty 250

## 2018-12-01 MED ORDER — PROCHLORPERAZINE MALEATE 10 MG PO TABS: 10.0000 mg | ORAL_TABLET | Freq: Four times a day (QID) | ORAL | 0 refills | Status: DC | PRN

## 2018-12-01 NOTE — Progress Notes (Signed)
[place] OFFICE PROGRESS NOTE  [name], MD [address] [place] [address]  DIAGNOSIS: Stage IIIB (T1c, N3, M0)non-small cell lung cancer, adenocarcinoma presented with right upper lobe lung mass in addition to mediastinal and bilateral supraclavicular lymphadenopathy diagnosed in January 2020. PD-L1: 10% [employer] 360 molecular studies showed no actionable mutation.  PRIOR THERAPY: Concurrent chemoradiation with weekly carboplatin for AUC of 2 and paclitaxel 45 mg/M2.Status post 6 cycles.  Last dose was given on October 10, 2018 with stable disease.  CURRENT THERAPY: Consolidation treatment with immunotherapy with Imfinzi 10 mg/KG every 2 weeks.  First dose November 17, 2018. Status post 1 cycle.   INTERVAL HISTORY: Brian [name] 58 y.o. male returns to the clinic for a follow-up visit.  The patient is feeling well today without any concerning complaints.  He recently underwent his first cycle of consolidation immunotherapy with Imfinzi without any adverse effects.  He denies any fever, chills, night sweats, or weight loss.  He denies any shortness of breath, cough, or hemoptysis.  He does note occasional pleuritic chest pain in the anterior right upper lobe region.  He denies any nausea, vomiting, diarrhea, or constipation.  He denies any headache or visual changes.  He denies any skin rashes or skin changes.  He is here today for evaluation before starting cycle #2.  MEDICAL HISTORY: Past Medical History:  Diagnosis Date  . Asthma   . Cancer ([facility])   . Diabetes mellitus without complication ([facility])   . Hypertension   . Subarachnoid hemorrhage ([facility])     ALLERGIES:  has No Known Allergies.  MEDICATIONS:  Current Outpatient Medications  Medication Sig Dispense Refill  . amLODipine (NORVASC) 5 MG tablet Take by mouth.    Marland Kitchen atorvastatin (LIPITOR) 20 MG tablet Take by mouth.    . metFORMIN (GLUCOPHAGE) 500 MG tablet Take by mouth.    .  prochlorperazine (COMPAZINE) 10 MG tablet Take 1 tablet (10 mg total) by mouth every 6 (six) hours as needed for nausea or vomiting. 30 tablet 0  . sucralfate (CARAFATE) 1 GM/10ML suspension Take 10 mLs (1 g total) by mouth 4 (four) times daily -  with meals and at bedtime. 3600 mL 1  . venlafaxine (EFFEXOR) 37.5 MG tablet TAKE 1 TABLET ONCE DAILY.     No current facility-administered medications for this visit.     SURGICAL HISTORY:  Past Surgical History:  Procedure Laterality Date  . LEG SURGERY  age 73    left leg, from fracture    REVIEW OF SYSTEMS:   Review of Systems  Constitutional: Negative for appetite change, chills, fatigue, fever and unexpected weight change.  HENT:   Negative for mouth sores, nosebleeds, sore throat and trouble swallowing.   Eyes: Negative for eye problems and icterus.  Respiratory: Negative for cough, hemoptysis, shortness of breath and wheezing.   Cardiovascular: Positive for occasional pleuritic chest pain in the right anterior chest. Negative for leg swelling.  Gastrointestinal: Negative for abdominal pain, constipation, diarrhea, nausea and vomiting.  Genitourinary: Negative for bladder incontinence, difficulty urinating, dysuria, frequency and hematuria.   Musculoskeletal: Negative for back pain, gait problem, neck pain and neck stiffness.  Skin: Negative for itching and rash.  Neurological: Negative for dizziness, extremity weakness, gait problem, headaches, light-headedness and seizures.  Hematological: Negative for adenopathy. Does not bruise/bleed easily.  Psychiatric/Behavioral: Negative for confusion, depression and sleep disturbance. The patient is not nervous/anxious.     PHYSICAL EXAMINATION:  Blood pressure 122/84,  pulse (!) 103, temperature 98.5 F (36.9 C), temperature source Oral, resp. rate 18, height _0  (1.727 m), weight 190 lb 11.2 oz (86.5 kg), SpO2 97 %.  ECOG PERFORMANCE STATUS: 1 - Symptomatic but completely  ambulatory  Physical Exam  Constitutional: Oriented to person, place, and time and well-developed, well-nourished, and in no distress.  HENT:  Head: Normocephalic and atraumatic.  Mouth/Throat: Oropharynx is clear and moist. No oropharyngeal exudate.  Eyes: Conjunctivae are normal. Right eye exhibits no discharge. Left eye exhibits no discharge. No scleral icterus.  Neck: Normal range of motion. Neck supple.  Cardiovascular: Normal rate, regular rhythm, normal heart sounds and intact distal pulses.   Pulmonary/Chest: Effort normal and breath sounds normal. No respiratory distress. No wheezes. No rales.  Abdominal: Soft. Bowel sounds are normal. Exhibits no distension and no mass. There is no tenderness.  Musculoskeletal: Normal range of motion. Exhibits no edema.  Lymphadenopathy:    No cervical adenopathy.  Neurological: Alert and oriented to person, place, and time. Exhibits normal muscle tone. Gait normal. Coordination normal.  Skin: Skin is warm and dry. No rash noted. Not diaphoretic. No erythema. No pallor.  Psychiatric: Mood, memory and judgment normal.  Vitals reviewed.  LABORATORY DATA: Lab Results  Component Value Date   WBC 8.5 12/01/2018   HGB 13.2 12/01/2018   HCT 38.8 (L) 12/01/2018   MCV 94.9 12/01/2018   PLT 185 12/01/2018      Chemistry      Component Value Date/Time   NA 141 12/01/2018 1232   K 4.1 12/01/2018 1232   CL 107 12/01/2018 1232   CO2 24 12/01/2018 1232   BUN 13 12/01/2018 1232   CREATININE 0.85 12/01/2018 1232      Component Value Date/Time   CALCIUM 8.8 (L) 12/01/2018 1232   ALKPHOS 95 12/01/2018 1232   AST 23 12/01/2018 1232   ALT 38 12/01/2018 1232   BILITOT 0.2 (L) 12/01/2018 1232       RADIOGRAPHIC STUDIES:  Ct Chest W Contrast  Result Date: 11/07/2018 CLINICAL DATA:  Non-small-cell lung cancer. EXAM: CT CHEST WITH CONTRAST TECHNIQUE: Multidetector CT imaging of the chest was performed during intravenous contrast administration.  CONTRAST:  56m OMNIPAQUE IOHEXOL 300 MG/ML  SOLN COMPARISON:  PET-CT 08/11/2018.  Chest CT 07/25/2018. FINDINGS: Cardiovascular: The heart size is normal. No substantial pericardial effusion. Coronary artery calcification is evident. Atherosclerotic calcification is noted in the wall of the thoracic aorta. Mediastinum/Nodes: 9 mm short axis right paratracheal node (57/2) was 10 mm on 07/25/2018. 12 mm short axis right hilar lymph node was not well seen on previous noncontrast studies. 9 mm short axis right paratracheal node (51/2) was 8 mm short axis on previous CT. There is no axillary lymphadenopathy. 13 mm short axis left supraclavicular node (7/2) was 15 mm short axis on previous PET-CT. Index right supraclavicular node measured on previous PET-CT at 11 mm is stable at 11 mm today (14/2). The esophagus has normal imaging features. Lungs/Pleura: Centrilobular emphsyema noted. The central tracheobronchial airways are patent. 2.8 cm right upper lobe pulmonary nodule is stable in size comparing to 07/25/2018. Subsegmental atelectasis or linear scarring noted anteriorly in the right upper lobe, new in the interval. No new suspicious pulmonary nodule or mass. No pleural effusion. Upper Abdomen: Unremarkable. Musculoskeletal: No worrisome lytic or sclerotic osseous abnormality. IMPRESSION: 1. Stable size of the right upper lobe pulmonary nodule, measuring up to 2.8 cm diameter. 2. No substantial change in bilateral supraclavicular, mediastinal, and right hilar lymphadenopathy,  seen to be hypermetabolic on previous PET imaging. 3.  Aortic Atherosclerois (ICD10-170.0) 4.  Emphysema. ([id number]) Electronically Signed   By: Misty Stanley M.D.   On: 11/07/2018 12:58     ASSESSMENT/PLAN:  This is a very pleasant 58 year old Caucasian male diagnosed with stage IIIb non-small cell lung cancer, adenocarcinoma.  He presented with a right upper lobe lung mass in addition to mediastinal and bilateral supraclavicular  lymphadenopathy. He was diagnosed in January 2020.  His PDL 1 expression is 10% and he has no actionable mutations. He underwent a course of concurrent chemoradiation with weekly carboplatin and paclitaxel.  He is status post 6 cycles.  He tolerated it well except for fatigue and odynophagia. He is currently undergoing consolidation immunotherapy with Imfinzi 10 mg/kg IV every 2 weeks.  He is status post his first cycle of treatment.  He tolerated it well without any adverse effects. The patient was seen with Dr. Julien Nordmann today.  Labs were reviewed with patient.  We recommend that he proceed with cycle #2 today scheduled.  I will see him back for a follow-up visit in 2 weeks for evaluation before starting cycle #3. I have sent a refill of compazine to his pharmacy should he develop nausea.  The patient was advised to call immediately if he has any concerning symptoms in the interval. The patient voices understanding of current disease status and treatment options and is in agreement with the current care plan. All questions were answered. The patient knows to call the clinic with any problems, questions or concerns. We can certainly see the patient much sooner if necessary  No orders of the defined types were placed in this encounter.    Cassandra L Heilingoetter, PA-C 12/01/18  ADDENDUM: Hematology/Oncology Attending: I had a face-to-face encounter with the patient today.  I recommended his care plan.  This is a very pleasant 58 years old white male with a stage IIIb non-small cell lung cancer, adenocarcinoma with no actionable mutations and PDL 1 expression of 10%.  The patient underwent induction concurrent chemoradiation with weekly carboplatin and paclitaxel with a stable disease. He is currently undergoing consolidation treatment with immunotherapy with Imfinzi status post 1 cycle.  He tolerated the first cycle of his treatment well with no concerning adverse effects. I recommended for the  patient to continue his current treatment with Imfinzi and he will proceed with cycle #2 today. He will come back for follow-up visit in 3 weeks for evaluation before the next cycle of his treatment. He was advised to call immediately if he has any concerning symptoms in the interval. Disclaimer: This note was dictated with voice recognition software. Similar sounding words can inadvertently be transcribed and may be missed upon review. Eilleen Kempf, MD 12/01/18

## 2018-12-01 NOTE — Patient Instructions (Addendum)
[facility] Discharge Instructions for Patients Receiving Chemotherapy  Today you received the following chemotherapy agents Durvalumab (IMFINZI).  To help prevent nausea and vomiting after your treatment, we encourage you to take your nausea medication as prescribed.   If you develop nausea and vomiting that is not controlled by your nausea medication, call the clinic.   BELOW ARE SYMPTOMS THAT SHOULD BE REPORTED IMMEDIATELY:  *FEVER GREATER THAN 100.5 F  *CHILLS WITH OR WITHOUT FEVER  NAUSEA AND VOMITING THAT IS NOT CONTROLLED WITH YOUR NAUSEA MEDICATION  *UNUSUAL SHORTNESS OF BREATH  *UNUSUAL BRUISING OR BLEEDING  TENDERNESS IN MOUTH AND THROAT WITH OR WITHOUT PRESENCE OF ULCERS  *URINARY PROBLEMS  *BOWEL PROBLEMS  UNUSUAL RASH Items with * indicate a potential emergency and should be followed up as soon as possible.  Feel free to call the clinic should you have any questions or concerns. The clinic phone number is [phone].  Please show the [facility] at check-in to the Emergency Department and triage nurse.  Coronavirus (COVID-19) Are you at risk?  Are you at risk for the Coronavirus (COVID-19)?  To be considered HIGH RISK for Coronavirus (COVID-19), you have to meet the following criteria:  . Traveled to [place], [place], [place], [place] or [place]; or in the [place] to [place], [place], [place], or [place]; and have fever, cough, and shortness of breath within the last 2 weeks of travel OR . Been in close contact with a person diagnosed with COVID-19 within the last 2 weeks and have fever, cough, and shortness of breath . IF YOU DO NOT MEET THESE CRITERIA, YOU ARE CONSIDERED LOW RISK FOR COVID-19.  What to do if you are HIGH RISK for COVID-19?  Marland Kitchen If you are having a medical emergency, call 911. . Seek medical care right away. Before you go to a doctor's office, urgent care or emergency department, call ahead and tell them  about your recent travel, contact with someone diagnosed with COVID-19, and your symptoms. You should receive instructions from your physician's office regarding next steps of care.  . When you arrive at healthcare provider, tell the healthcare staff immediately you have returned from visiting [place], [place], [place], [place] or [place]; or traveled in the [place] to [place], [place], [place], or [place]; in the last two weeks or you have been in close contact with a person diagnosed with COVID-19 in the last 2 weeks.   . Tell the health care staff about your symptoms: fever, cough and shortness of breath. . After you have been seen by a medical provider, you will be either: o Tested for (COVID-19) and discharged home on quarantine except to seek medical care if symptoms worsen, and asked to  - Stay home and avoid contact with others until you get your results (4-5 days)  - Avoid travel on public transportation if possible (such as bus, train, or airplane) or o Sent to the Emergency Department by EMS for evaluation, COVID-19 testing, and possible admission depending on your condition and test results.  What to do if you are LOW RISK for COVID-19?  Reduce your risk of any infection by using the same precautions used for avoiding the common cold or flu:  Marland Kitchen Wash your hands often with soap and warm water for at least 20 seconds.  If soap and water are not readily available, use an alcohol-based hand sanitizer with at least 60% alcohol.  . If coughing or  sneezing, cover your mouth and nose by coughing or sneezing into the elbow areas of your shirt or coat, into a tissue or into your sleeve (not your hands). . Avoid shaking hands with others and consider head nods or verbal greetings only. . Avoid touching your eyes, nose, or mouth with unwashed hands.  . Avoid close contact with people who are sick. . Avoid places or events with large numbers of people in one location, like concerts or  sporting events. . Carefully consider travel plans you have or are making. . If you are planning any travel outside or inside the [place], visit the CDC's Travelers' Health webpage for the latest health notices. . If you have some symptoms but not all symptoms, continue to monitor at home and seek medical attention if your symptoms worsen. . If you are having a medical emergency, call 911.   [facility] / e-Visit: [email or web address]         [facility]: [facility]: [phone]                   [facility]: [phone]

## 2018-12-01 NOTE — Telephone Encounter (Signed)
Scheduled appt per 4/30 los - pt to get an updated schedule next visit.

## 2018-12-15 ENCOUNTER — Inpatient Hospital Stay: Payer: BLUE CROSS/BLUE SHIELD

## 2018-12-15 ENCOUNTER — Inpatient Hospital Stay (HOSPITAL_BASED_OUTPATIENT_CLINIC_OR_DEPARTMENT_OTHER): Payer: BLUE CROSS/BLUE SHIELD | Admitting: Physician Assistant

## 2018-12-15 ENCOUNTER — Other Ambulatory Visit: Payer: Self-pay

## 2018-12-15 ENCOUNTER — Inpatient Hospital Stay: Payer: BLUE CROSS/BLUE SHIELD | Attending: Internal Medicine

## 2018-12-15 ENCOUNTER — Encounter: Payer: Self-pay | Admitting: Physician Assistant

## 2018-12-15 VITALS — HR 96

## 2018-12-15 VITALS — BP 124/86 | HR 103 | Temp 99.8°F | Resp 18 | Ht 68.0 in | Wt 186.6 lb

## 2018-12-15 DIAGNOSIS — R197 Diarrhea, unspecified: Secondary | ICD-10-CM | POA: Diagnosis not present

## 2018-12-15 DIAGNOSIS — Z5112 Encounter for antineoplastic immunotherapy: Secondary | ICD-10-CM

## 2018-12-15 DIAGNOSIS — Z7984 Long term (current) use of oral hypoglycemic drugs: Secondary | ICD-10-CM | POA: Diagnosis not present

## 2018-12-15 DIAGNOSIS — I10 Essential (primary) hypertension: Secondary | ICD-10-CM

## 2018-12-15 DIAGNOSIS — E119 Type 2 diabetes mellitus without complications: Secondary | ICD-10-CM

## 2018-12-15 DIAGNOSIS — C3401 Malignant neoplasm of right main bronchus: Secondary | ICD-10-CM | POA: Insufficient documentation

## 2018-12-15 DIAGNOSIS — C3491 Malignant neoplasm of unspecified part of right bronchus or lung: Secondary | ICD-10-CM

## 2018-12-15 DIAGNOSIS — J45909 Unspecified asthma, uncomplicated: Secondary | ICD-10-CM | POA: Diagnosis not present

## 2018-12-15 DIAGNOSIS — Z79899 Other long term (current) drug therapy: Secondary | ICD-10-CM | POA: Insufficient documentation

## 2018-12-15 DIAGNOSIS — R110 Nausea: Secondary | ICD-10-CM | POA: Diagnosis not present

## 2018-12-15 DIAGNOSIS — J189 Pneumonia, unspecified organism: Secondary | ICD-10-CM | POA: Insufficient documentation

## 2018-12-15 LAB — CMP (CANCER CENTER ONLY)
ALT: 38 U/L (ref 0–44)
AST: 24 U/L (ref 15–41)
Albumin: 3.5 g/dL (ref 3.5–5.0)
Alkaline Phosphatase: 107 U/L (ref 38–126)
Anion gap: 8 (ref 5–15)
BUN: 14 mg/dL (ref 6–20)
CO2: 24 mmol/L (ref 22–32)
Calcium: 8.9 mg/dL (ref 8.9–10.3)
Chloride: 106 mmol/L (ref 98–111)
Creatinine: 0.82 mg/dL (ref 0.61–1.24)
GFR, Est AFR Am: >60 mL/min (ref >60)
GFR, Estimated: >60 mL/min (ref >60)
Glucose, Bld: 127 mg/dL — ABNORMAL HIGH (ref 70–99)
Potassium: 4 mmol/L (ref 3.5–5.1)
Sodium: 138 mmol/L (ref 135–145)
Total Bilirubin: 0.3 mg/dL (ref 0.3–1.2)
Total Protein: 6.6 g/dL (ref 6.5–8.1)

## 2018-12-15 LAB — CBC WITH DIFFERENTIAL (CANCER CENTER ONLY)
Abs Immature Granulocytes: 0.04 10*3/uL (ref 0.00–0.07)
Basophils Absolute: 0.1 10*3/uL (ref 0.0–0.1)
Basophils Relative: 1 %
Eosinophils Absolute: 0.5 10*3/uL (ref 0.0–0.5)
Eosinophils Relative: 5 %
HCT: 39.8 % (ref 39.0–52.0)
Hemoglobin: 13.5 g/dL (ref 13.0–17.0)
Immature Granulocytes: 0 %
Lymphocytes Relative: 6 %
Lymphs Abs: 0.6 10*3/uL — ABNORMAL LOW (ref 0.7–4.0)
MCH: 31.4 pg (ref 26.0–34.0)
MCHC: 33.9 g/dL (ref 30.0–36.0)
MCV: 92.6 fL (ref 80.0–100.0)
Monocytes Absolute: 1.3 10*3/uL — ABNORMAL HIGH (ref 0.1–1.0)
Monocytes Relative: 13 %
Neutro Abs: 7.4 10*3/uL (ref 1.7–7.7)
Neutrophils Relative %: 75 %
Platelet Count: 230 10*3/uL (ref 150–400)
RBC: 4.3 MIL/uL (ref 4.22–5.81)
RDW: 12.4 % (ref 11.5–15.5)
WBC Count: 10 10*3/uL (ref 4.0–10.5)
nRBC: 0 % (ref 0.0–0.2)

## 2018-12-15 LAB — TSH: TSH: 0.611 u[IU]/mL (ref 0.320–4.118)

## 2018-12-15 MED ORDER — SODIUM CHLORIDE 0.9 % IV SOLN
Freq: Once | INTRAVENOUS | Status: AC
  Administered 2018-12-15: 15:00:00 via INTRAVENOUS
  Filled 2018-12-15: qty 250

## 2018-12-15 MED ORDER — SODIUM CHLORIDE 0.9 % IV SOLN
10.0000 mg/kg | Freq: Once | INTRAVENOUS | Status: AC
  Administered 2018-12-15: 860 mg via INTRAVENOUS
  Filled 2018-12-15: qty 10

## 2018-12-15 NOTE — Patient Instructions (Signed)
[facility] Discharge Instructions for Patients Receiving Chemotherapy  Today you received the following chemotherapy agents Durvalumab (IMFINZI).  To help prevent nausea and vomiting after your treatment, we encourage you to take your nausea medication as prescribed.   If you develop nausea and vomiting that is not controlled by your nausea medication, call the clinic.   BELOW ARE SYMPTOMS THAT SHOULD BE REPORTED IMMEDIATELY:  *FEVER GREATER THAN 100.5 F  *CHILLS WITH OR WITHOUT FEVER  NAUSEA AND VOMITING THAT IS NOT CONTROLLED WITH YOUR NAUSEA MEDICATION  *UNUSUAL SHORTNESS OF BREATH  *UNUSUAL BRUISING OR BLEEDING  TENDERNESS IN MOUTH AND THROAT WITH OR WITHOUT PRESENCE OF ULCERS  *URINARY PROBLEMS  *BOWEL PROBLEMS  UNUSUAL RASH Items with * indicate a potential emergency and should be followed up as soon as possible.  Feel free to call the clinic should you have any questions or concerns. The clinic phone number is [phone].  Please show the [facility] at check-in to the Emergency Department and triage nurse.  Coronavirus (COVID-19) Are you at risk?  Are you at risk for the Coronavirus (COVID-19)?  To be considered HIGH RISK for Coronavirus (COVID-19), you have to meet the following criteria:  . Traveled to [place], [place], [place], [place] or [place]; or in the [place] to [place], [place], [place], or [place]; and have fever, cough, and shortness of breath within the last 2 weeks of travel OR . Been in close contact with a person diagnosed with COVID-19 within the last 2 weeks and have fever, cough, and shortness of breath . IF YOU DO NOT MEET THESE CRITERIA, YOU ARE CONSIDERED LOW RISK FOR COVID-19.  What to do if you are HIGH RISK for COVID-19?  Marland Kitchen If you are having a medical emergency, call 911. . Seek medical care right away. Before you go to a doctor's office, urgent care or emergency department, call ahead and tell them  about your recent travel, contact with someone diagnosed with COVID-19, and your symptoms. You should receive instructions from your physician's office regarding next steps of care.  . When you arrive at healthcare provider, tell the healthcare staff immediately you have returned from visiting [place], [place], [place], [place] or [place]; or traveled in the [place] to [place], [place], [place], or [place]; in the last two weeks or you have been in close contact with a person diagnosed with COVID-19 in the last 2 weeks.   . Tell the health care staff about your symptoms: fever, cough and shortness of breath. . After you have been seen by a medical provider, you will be either: o Tested for (COVID-19) and discharged home on quarantine except to seek medical care if symptoms worsen, and asked to  - Stay home and avoid contact with others until you get your results (4-5 days)  - Avoid travel on public transportation if possible (such as bus, train, or airplane) or o Sent to the Emergency Department by EMS for evaluation, COVID-19 testing, and possible admission depending on your condition and test results.  What to do if you are LOW RISK for COVID-19?  Reduce your risk of any infection by using the same precautions used for avoiding the common cold or flu:  Marland Kitchen Wash your hands often with soap and warm water for at least 20 seconds.  If soap and water are not readily available, use an alcohol-based hand sanitizer with at least 60% alcohol.  . If coughing or  sneezing, cover your mouth and nose by coughing or sneezing into the elbow areas of your shirt or coat, into a tissue or into your sleeve (not your hands). . Avoid shaking hands with others and consider head nods or verbal greetings only. . Avoid touching your eyes, nose, or mouth with unwashed hands.  . Avoid close contact with people who are sick. . Avoid places or events with large numbers of people in one location, like concerts or  sporting events. . Carefully consider travel plans you have or are making. . If you are planning any travel outside or inside the [place], visit the CDC's Travelers' Health webpage for the latest health notices. . If you have some symptoms but not all symptoms, continue to monitor at home and seek medical attention if your symptoms worsen. . If you are having a medical emergency, call 911.   [facility] / e-Visit: [email or web address]         [facility]: [facility]: [phone]                   [facility]: [phone]

## 2018-12-15 NOTE — Progress Notes (Signed)
[facility] OFFICE PROGRESS NOTE  [name], MD [address] [place] [address]  DIAGNOSIS: Stage IIIB (T1c, N3, M0)non-small cell lung cancer, adenocarcinoma presented with right upper lobe lung mass in addition to mediastinal and bilateral supraclavicular lymphadenopathy diagnosed in January 2020. PD-L1: 10% Guardant 360 molecular studiesshowed no actionable mutation.  PRIOR THERAPY: Concurrent chemoradiation with weekly carboplatin for AUC of 2 and paclitaxel 45 mg/M2.Status post6cycles.Last dose was given on March 9, 2020with stable disease.  CURRENT THERAPY: Consolidation treatment with immunotherapy with Imfinzi 10 mg/KG every 2 weeks. First dose November 17, 2018. Status post 2 cycle.   INTERVAL HISTORY: Brian [name] 58 y.o. male returns to the clinic for a follow-up visit.  The patient is feeling well today without any concerning complaints.  He is tolerating his treatment with immunotherapy fairly well without any adverse side effects.  He denies any fever, chills, night sweats, or weight loss.  He denies any chest pain, cough, or hemoptysis.  The patient walks approximately 2-3 miles every day with his wife.  He does state that he has some dyspnea on exertion when climbing up inclines which is unusual for him.  He denies any nausea, vomiting, diarrhea, or constipation.  He denies any headache or visual changes.  He is here today for evaluation before starting cycle #3 of his treatment.  MEDICAL HISTORY: Past Medical History:  Diagnosis Date  . Asthma   . Cancer ([place])   . Diabetes mellitus without complication ([place])   . Hypertension   . Subarachnoid hemorrhage ([facility])     ALLERGIES:  has No Known Allergies.  MEDICATIONS:  Current Outpatient Medications  Medication Sig Dispense Refill  . amLODipine (NORVASC) 5 MG tablet Take by mouth.    Marland Kitchen atorvastatin (LIPITOR) 20 MG tablet Take by mouth.    . metFORMIN (GLUCOPHAGE) 500 MG tablet  Take by mouth.    . prochlorperazine (COMPAZINE) 10 MG tablet Take 1 tablet (10 mg total) by mouth every 6 (six) hours as needed for nausea or vomiting. 30 tablet 0  . sucralfate (CARAFATE) 1 GM/10ML suspension Take 10 mLs (1 g total) by mouth 4 (four) times daily -  with meals and at bedtime. 3600 mL 1  . venlafaxine (EFFEXOR) 37.5 MG tablet TAKE 1 TABLET ONCE DAILY.     No current facility-administered medications for this visit.    Facility-Administered Medications Ordered in Other Visits  Medication Dose Route Frequency Provider Last Rate Last Dose  . durvalumab (IMFINZI) 860 mg in sodium chloride 0.9 % 100 mL chemo infusion  10 mg/kg (Treatment Plan Recorded) Intravenous Once Curt Bears, MD        SURGICAL HISTORY:  Past Surgical History:  Procedure Laterality Date  . LEG SURGERY  age 57    left leg, from fracture    REVIEW OF SYSTEMS:   Review of Systems  Constitutional: Negative for appetite change, chills, fatigue, fever and unexpected weight change.  HENT:   Negative for mouth sores, nosebleeds, sore throat and trouble swallowing.   Eyes: Negative for eye problems and icterus.  Respiratory: Negative for cough, hemoptysis, shortness of breath and wheezing.   Cardiovascular: Negative for chest pain and leg swelling.  Gastrointestinal: Negative for abdominal pain, constipation, diarrhea, nausea and vomiting.  Genitourinary: Negative for bladder incontinence, difficulty urinating, dysuria, frequency and hematuria.   Musculoskeletal: Negative for back pain, gait problem, neck pain and neck stiffness.  Skin: Negative for itching and rash.  Neurological: Negative for  dizziness, extremity weakness, gait problem, headaches, light-headedness and seizures.  Hematological: Negative for adenopathy. Does not bruise/bleed easily.  Psychiatric/Behavioral: Negative for confusion, depression and sleep disturbance. The patient is not nervous/anxious.     PHYSICAL EXAMINATION:  Blood  pressure 124/86, pulse (!) 103, temperature 99.8 F (37.7 C), temperature source Oral, resp. rate 18, height _0  (1.727 m), weight 186 lb 9.6 oz (84.6 kg), SpO2 97 %.  ECOG PERFORMANCE STATUS: 0 - Asymptomatic  Physical Exam  Constitutional: Oriented to person, place, and time and well-developed, well-nourished, and in no distress.  HENT:  Head: Normocephalic and atraumatic.  Mouth/Throat: Oropharynx is clear and moist. No oropharyngeal exudate.  Eyes: Conjunctivae are normal. Right eye exhibits no discharge. Left eye exhibits no discharge. No scleral icterus.  Neck: Normal range of motion. Neck supple.  Cardiovascular: Normal rate, regular rhythm, normal heart sounds and intact distal pulses.   Pulmonary/Chest: Effort normal and breath sounds normal. No respiratory distress. No wheezes. No rales.  Abdominal: Soft. Bowel sounds are normal. Exhibits no distension and no mass. There is no tenderness.  Musculoskeletal: Normal range of motion. Exhibits no edema.  Lymphadenopathy:    No cervical adenopathy.  Neurological: Alert and oriented to person, place, and time. Exhibits normal muscle tone. Gait normal. Coordination normal.  Skin: Skin is warm and dry. No rash noted. Not diaphoretic. No erythema. No pallor.  Psychiatric: Mood, memory and judgment normal.  Vitals reviewed.  LABORATORY DATA: Lab Results  Component Value Date   WBC 10.0 12/15/2018   HGB 13.5 12/15/2018   HCT 39.8 12/15/2018   MCV 92.6 12/15/2018   PLT 230 12/15/2018      Chemistry      Component Value Date/Time   NA 138 12/15/2018 1341   K 4.0 12/15/2018 1341   CL 106 12/15/2018 1341   CO2 24 12/15/2018 1341   BUN 14 12/15/2018 1341   CREATININE 0.82 12/15/2018 1341      Component Value Date/Time   CALCIUM 8.9 12/15/2018 1341   ALKPHOS 107 12/15/2018 1341   AST 24 12/15/2018 1341   ALT 38 12/15/2018 1341   BILITOT 0.3 12/15/2018 1341       RADIOGRAPHIC STUDIES:  No results  found.   ASSESSMENT/PLAN:  This is a very pleasant 58 year old Caucasian male diagnosed with stage IIIb non-small cell lung cancer, adenocarcinoma.  He presented with a right upper lobe lung mass in addition to mediastinal and bilateral supraclavicular lymphadenopathy.  He was diagnosed in January 2020.  His PDL 1 expression is 10% and he has no actionable mutations. He underwent a course of concurrent chemoradiation with weekly carboplatin and paclitaxel.  He is status post 6 cycles.  He tolerated well except for fatigue and odynophagia.  He is currently undergoing consolidation immunotherapy with Imfinzi 10 mg/kg IV every 2 weeks.  He is status post 2 cycles of treatment.  He tolerated it well without any adverse side effects. The patient was seen with Dr. Julien Nordmann today.  Labs were reviewed with the patient.  We recommend that he proceed with cycle #3 today as scheduled.  I will see him back for follow-up visit in 2 weeks for evaluation before starting cycle #4. The patient was advised to call immediately if he has any concerning symptoms in the interval. The patient voices understanding of current disease status and treatment options and is in agreement with the current care plan. All questions were answered. The patient knows to call the clinic with any problems, questions or  concerns. We can certainly see the patient much sooner if necessary  No orders of the defined types were placed in this encounter.    Cassandra L Heilingoetter, PA-C 12/15/18  ADDENDUM: Hematology/Oncology Attending: I had a face-to-face encounter with the patient.  I recommended his care plan.  This is a very pleasant 58 years old white male with a stage IIIb non-small cell lung cancer, adenocarcinoma status post concurrent chemoradiation with weekly carboplatin and paclitaxel with stable disease.  The patient is currently undergoing consolidation treatment with Imfinzi every 2 weeks status post 2 cycles. He has been  tolerating this treatment well with no concerning adverse effects. I recommended for the patient to proceed with cycle #3 today as planned. I will see him back for follow-up visit in 4 weeks for evaluation before the next cycle of his treatment. He was advised to call immediately if he has any concerning symptoms in the interval.  Disclaimer: This note was dictated with voice recognition software. Similar sounding words can inadvertently be transcribed and may be missed upon review. Eilleen Kempf, MD 12/16/18

## 2018-12-27 ENCOUNTER — Telehealth: Payer: Self-pay | Admitting: Medical Oncology

## 2018-12-27 NOTE — Telephone Encounter (Signed)
Call him - he has "pneumonitis" and wanted [place] to know.

## 2018-12-29 ENCOUNTER — Inpatient Hospital Stay: Payer: BLUE CROSS/BLUE SHIELD

## 2018-12-29 ENCOUNTER — Inpatient Hospital Stay (HOSPITAL_BASED_OUTPATIENT_CLINIC_OR_DEPARTMENT_OTHER): Payer: BLUE CROSS/BLUE SHIELD | Admitting: Physician Assistant

## 2018-12-29 ENCOUNTER — Other Ambulatory Visit: Payer: Self-pay

## 2018-12-29 ENCOUNTER — Encounter: Payer: Self-pay | Admitting: Physician Assistant

## 2018-12-29 VITALS — HR 85

## 2018-12-29 VITALS — BP 117/83 | HR 101 | Temp 98.2°F | Resp 18 | Ht 68.0 in | Wt 188.9 lb

## 2018-12-29 DIAGNOSIS — C3401 Malignant neoplasm of right main bronchus: Secondary | ICD-10-CM | POA: Diagnosis not present

## 2018-12-29 DIAGNOSIS — J45909 Unspecified asthma, uncomplicated: Secondary | ICD-10-CM

## 2018-12-29 DIAGNOSIS — J189 Pneumonia, unspecified organism: Secondary | ICD-10-CM

## 2018-12-29 DIAGNOSIS — C3491 Malignant neoplasm of unspecified part of right bronchus or lung: Secondary | ICD-10-CM

## 2018-12-29 DIAGNOSIS — I10 Essential (primary) hypertension: Secondary | ICD-10-CM

## 2018-12-29 DIAGNOSIS — R110 Nausea: Secondary | ICD-10-CM | POA: Diagnosis not present

## 2018-12-29 DIAGNOSIS — Z5112 Encounter for antineoplastic immunotherapy: Secondary | ICD-10-CM | POA: Diagnosis not present

## 2018-12-29 DIAGNOSIS — R197 Diarrhea, unspecified: Secondary | ICD-10-CM

## 2018-12-29 DIAGNOSIS — E119 Type 2 diabetes mellitus without complications: Secondary | ICD-10-CM

## 2018-12-29 DIAGNOSIS — Z7984 Long term (current) use of oral hypoglycemic drugs: Secondary | ICD-10-CM

## 2018-12-29 DIAGNOSIS — Z79899 Other long term (current) drug therapy: Secondary | ICD-10-CM

## 2018-12-29 LAB — CBC WITH DIFFERENTIAL (CANCER CENTER ONLY)
Abs Immature Granulocytes: 0.04 10*3/uL (ref 0.00–0.07)
Basophils Absolute: 0.1 10*3/uL (ref 0.0–0.1)
Basophils Relative: 1 %
Eosinophils Absolute: 0.5 10*3/uL (ref 0.0–0.5)
Eosinophils Relative: 5 %
HCT: 40.9 % (ref 39.0–52.0)
Hemoglobin: 13.5 g/dL (ref 13.0–17.0)
Immature Granulocytes: 0 %
Lymphocytes Relative: 7 %
Lymphs Abs: 0.7 10*3/uL (ref 0.7–4.0)
MCH: 30.6 pg (ref 26.0–34.0)
MCHC: 33 g/dL (ref 30.0–36.0)
MCV: 92.7 fL (ref 80.0–100.0)
Monocytes Absolute: 1.3 10*3/uL — ABNORMAL HIGH (ref 0.1–1.0)
Monocytes Relative: 14 %
Neutro Abs: 7 10*3/uL (ref 1.7–7.7)
Neutrophils Relative %: 73 %
Platelet Count: 234 10*3/uL (ref 150–400)
RBC: 4.41 MIL/uL (ref 4.22–5.81)
RDW: 12 % (ref 11.5–15.5)
WBC Count: 9.6 10*3/uL (ref 4.0–10.5)
nRBC: 0 % (ref 0.0–0.2)

## 2018-12-29 LAB — CMP (CANCER CENTER ONLY)
ALT: 27 U/L (ref 0–44)
AST: 21 U/L (ref 15–41)
Albumin: 3.4 g/dL — ABNORMAL LOW (ref 3.5–5.0)
Alkaline Phosphatase: 101 U/L (ref 38–126)
Anion gap: 8 (ref 5–15)
BUN: 13 mg/dL (ref 6–20)
CO2: 24 mmol/L (ref 22–32)
Calcium: 8.8 mg/dL — ABNORMAL LOW (ref 8.9–10.3)
Chloride: 106 mmol/L (ref 98–111)
Creatinine: 0.85 mg/dL (ref 0.61–1.24)
GFR, Est AFR Am: >60 mL/min (ref >60)
GFR, Estimated: >60 mL/min (ref >60)
Glucose, Bld: 112 mg/dL — ABNORMAL HIGH (ref 70–99)
Potassium: 4.1 mmol/L (ref 3.5–5.1)
Sodium: 138 mmol/L (ref 135–145)
Total Bilirubin: 0.2 mg/dL — ABNORMAL LOW (ref 0.3–1.2)
Total Protein: 6.8 g/dL (ref 6.5–8.1)

## 2018-12-29 MED ORDER — SODIUM CHLORIDE 0.9 % IV SOLN
Freq: Once | INTRAVENOUS | Status: AC
  Administered 2018-12-29: 14:00:00 via INTRAVENOUS
  Filled 2018-12-29: qty 250

## 2018-12-29 MED ORDER — SODIUM CHLORIDE 0.9 % IV SOLN
10.0000 mg/kg | Freq: Once | INTRAVENOUS | Status: AC
  Administered 2018-12-29: 860 mg via INTRAVENOUS
  Filled 2018-12-29: qty 7.2

## 2018-12-29 NOTE — Patient Instructions (Signed)
[facility] Discharge Instructions for Patients Receiving Chemotherapy  Today you received the following chemotherapy agents:  durvalumab (Imfinzi)  To help prevent nausea and vomiting after your treatment, we encourage you to take your nausea medication as prescribed.   If you develop nausea and vomiting that is not controlled by your nausea medication, call the clinic.   BELOW ARE SYMPTOMS THAT SHOULD BE REPORTED IMMEDIATELY:  *FEVER GREATER THAN 100.5 F  *CHILLS WITH OR WITHOUT FEVER  NAUSEA AND VOMITING THAT IS NOT CONTROLLED WITH YOUR NAUSEA MEDICATION  *UNUSUAL SHORTNESS OF BREATH  *UNUSUAL BRUISING OR BLEEDING  TENDERNESS IN MOUTH AND THROAT WITH OR WITHOUT PRESENCE OF ULCERS  *URINARY PROBLEMS  *BOWEL PROBLEMS  UNUSUAL RASH Items with * indicate a potential emergency and should be followed up as soon as possible.  Feel free to call the clinic should you have any questions or concerns. The clinic phone number is [phone].  Please show the [facility] at check-in to the Emergency Department and triage nurse.

## 2018-12-29 NOTE — Progress Notes (Signed)
[facility] OFFICE PROGRESS NOTE  Brian Broker, MD [address] [place] [address]  DIAGNOSIS: Stage IIIB (T1c, N3, M0)non-small cell lung cancer, adenocarcinoma presented with right upper lobe lung mass in addition to mediastinal and bilateral supraclavicular lymphadenopathy diagnosed in January 2020. PD-L1: 10% Guardant 360 molecular studiesshowed no actionable mutation.  PRIOR THERAPY: Concurrent chemoradiation with weekly carboplatin for AUC of 2 and paclitaxel 45 mg/M2.Status post6cycles.Last dose was given on March 9, 2020with stable disease.   CURRENT THERAPY: Consolidation treatment with immunotherapy with Imfinzi 10 mg/KG every 2 weeks. First dose November 17, 2018.Status post 4 cycles.  INTERVAL HISTORY: Brian [name] 58 y.o. male returns to the clinic for a follow-up visit.  The patient is feeling better today. He recently saw his PCP for a productive cough, shortness of breath, and pleuritic chest pain and was found to have pneumonia from a chest x-ray.  He is currently being treated with cefdinir. He received rocephin and completed a course of azithromycin.  He denies any fevers, chills, night sweats, or weight loss.  He still continues to experience residual pleuritic chest pain in his RUL and cough although it is improved.  He denies any visual changes. He denies any rashes or skin changes. He reports some nausea with the antibiotics and associated mild diarrhea.  He is here today for evaluation before starting cycle #4.  MEDICAL HISTORY: Past Medical History:  Diagnosis Date  . Asthma   . Cancer ([facility])   . Diabetes mellitus without complication ([facility])   . Hypertension   . Subarachnoid hemorrhage ([facility])     ALLERGIES:  has No Known Allergies.  MEDICATIONS:  Current Outpatient Medications  Medication Sig Dispense Refill  . amLODipine (NORVASC) 5 MG tablet Take by mouth.    Marland Kitchen atorvastatin (LIPITOR) 20 MG tablet Take by mouth.    .  cefdinir (OMNICEF) 300 MG capsule Take 1 capsule by mouth 2 (two) times daily. For a total of 10 days    . metFORMIN (GLUCOPHAGE) 500 MG tablet Take by mouth.    . prochlorperazine (COMPAZINE) 10 MG tablet Take 1 tablet (10 mg total) by mouth every 6 (six) hours as needed for nausea or vomiting. 30 tablet 0  . sucralfate (CARAFATE) 1 GM/10ML suspension Take 10 mLs (1 g total) by mouth 4 (four) times daily -  with meals and at bedtime. 3600 mL 1  . venlafaxine (EFFEXOR) 37.5 MG tablet TAKE 1 TABLET ONCE DAILY.     No current facility-administered medications for this visit.     SURGICAL HISTORY:  Past Surgical History:  Procedure Laterality Date  . LEG SURGERY  age 40    left leg, from fracture    REVIEW OF SYSTEMS:   Review of Systems  Constitutional: Negative for appetite change, chills, fatigue, fever and unexpected weight change.  HENT:   Negative for mouth sores, nosebleeds, sore throat and trouble swallowing.   Eyes: Negative for eye problems and icterus.  Respiratory: Positive for productive cough and shortness of breath. Negative for hemoptysis and wheezing.   Cardiovascular: Positive for pleuritic chest pain in the right upper lobe. Negative for leg swelling.  Gastrointestinal: Positive for nausea and mild diarrhea. Negative for abdominal pain, constipation, and vomiting.  Genitourinary: Negative for bladder incontinence, difficulty urinating, dysuria, frequency and hematuria.   Musculoskeletal: Negative for back pain, gait problem, neck pain and neck stiffness.  Skin: Negative for itching and rash.  Neurological: Negative for dizziness, extremity weakness, gait  problem, headaches, light-headedness and seizures.  Hematological: Negative for adenopathy. Does not bruise/bleed easily.  Psychiatric/Behavioral: Negative for confusion, depression and sleep disturbance. The patient is not nervous/anxious.     PHYSICAL EXAMINATION:  Blood pressure 117/83, pulse (!) 101, temperature  98.2 F (36.8 C), temperature source Oral, resp. rate 18, height _0  (1.727 m), weight 188 lb 14.4 oz (85.7 kg), SpO2 95 %.  ECOG PERFORMANCE STATUS: 1 - Symptomatic but completely ambulatory  Physical Exam  Constitutional: Oriented to person, place, and time and well-developed, well-nourished, and in no distress.  HENT:  Head: Normocephalic and atraumatic.  Mouth/Throat: Oropharynx is clear and moist. No oropharyngeal exudate.  Eyes: Conjunctivae are normal. Right eye exhibits no discharge. Left eye exhibits no discharge. No scleral icterus.  Neck: Normal range of motion. Neck supple.  Cardiovascular: Normal rate, regular rhythm, normal heart sounds and intact distal pulses.   Pulmonary/Chest: Effort normal and breath sounds normal. No respiratory distress. No wheezes. No rales.  Abdominal: Soft. Bowel sounds are normal. Exhibits no distension and no mass. There is no tenderness.  Musculoskeletal: Normal range of motion. Exhibits no edema.  Lymphadenopathy:    No cervical adenopathy.  Neurological: Alert and oriented to person, place, and time. Exhibits normal muscle tone. Gait normal. Coordination normal.  Skin: Skin is warm and dry. No rash noted. Not diaphoretic. No erythema. No pallor.  Psychiatric: Mood, memory and judgment normal.  Vitals reviewed.  LABORATORY DATA: Lab Results  Component Value Date   WBC 9.6 12/29/2018   HGB 13.5 12/29/2018   HCT 40.9 12/29/2018   MCV 92.7 12/29/2018   PLT 234 12/29/2018      Chemistry      Component Value Date/Time   NA 138 12/29/2018 1227   K 4.1 12/29/2018 1227   CL 106 12/29/2018 1227   CO2 24 12/29/2018 1227   BUN 13 12/29/2018 1227   CREATININE 0.85 12/29/2018 1227      Component Value Date/Time   CALCIUM 8.8 (L) 12/29/2018 1227   ALKPHOS 101 12/29/2018 1227   AST 21 12/29/2018 1227   ALT 27 12/29/2018 1227   BILITOT 0.2 (L) 12/29/2018 1227       RADIOGRAPHIC STUDIES:  No results found.   ASSESSMENT/PLAN:   This is a very pleasant 58 year old Caucasian male diagnosed with stage IIIb non-small cell lung cancer, adenocarcinoma.  He presented with a right upper lobe lung mass in addition to mediastinal and bilateral supraclavicular lymphadenopathy.  He was diagnosed in January 2020.  His PDL 1 expression is 10% and he has no actionable mutations. He underwent a course of concurrent chemoradiation with weekly carboplatin and paclitaxel.  He is status post 6 cycles.  He tolerated well except for fatigue and odynophagia.  He is currently undergoing consolidation immunotherapy with Imfinzi 10 mg/kg IV every 2 weeks.  He is status post 3 cycles of treatment.  He tolerated it well without any adverse side effects.   The patient was seen with Dr. Julien Nordmann today.  Labs were reviewed with the patient.  We recommend that he proceed with cycle #4 today as scheduled.  I will see him back for follow-up visit in 2 weeks for evaluation before starting cycle #5.  The patient will continue his antibiotics as prescribed. The patient was advised to call immediately if he develops any new or worsening symptoms in the interval. He was instructed to call if he develops fevers, chills, night sweats, worsening cough, shortness of breath, sore throat, etc. He was  given the number to our office. If he develops new or worsening symptoms, we may need to evaluate him for pneumonitis.   The patient was advised to call immediately if he has any concerning symptoms in the interval. The patient voices understanding of current disease status and treatment options and is in agreement with the current care plan. All questions were answered. The patient knows to call the clinic with any problems, questions or concerns. We can certainly see the patient much sooner if necessary  No orders of the defined types were placed in this encounter.    Brian [name] L Yanis Larin, PA-C 12/29/18  ADDENDUM: Hematology/Oncology Attending: I had a  face-to-face encounter with the patient today.  I recommended his care plan.  This is a very pleasant 58 years old white male with a stage IIIb lung cancer, adenocarcinoma status post induction concurrent chemoradiation with weekly carboplatin and paclitaxel with a stable disease.  The patient is currently undergoing consolidation treatment with Imfinzi.  He has been tolerating the treatment well but recently he was complaining of worsening shortness of breath.  He was seen by his primary care physician and chest x-ray showed suspicious pneumonia.  The patient was a started on a course of antibiotics and he has 5 more days to complete this course.  He is feeling much better today.  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 I recommended for the patient to proceed with cycle #4 today and we will continue to monitor him closely for any worsening of his dyspnea.  The patient was strongly advised to call immediately if he has any worsening of his shortness of breath. He will come back for follow-up visit in 2 weeks if no other concerning symptoms for evaluation before the next cycle of his treatment.  Disclaimer: This note was dictated with voice recognition software. Similar sounding words can inadvertently be transcribed and may be missed upon review. Eilleen Kempf, MD 12/29/18

## 2019-01-06 ENCOUNTER — Telehealth: Payer: Self-pay | Admitting: *Deleted

## 2019-01-06 NOTE — Telephone Encounter (Signed)
I called Dr. Unk Lightning and discussed the case with him.  He mentioned that the patient is feeling much.  We will plan to have CT scan of the chest in 3 weeks for further evaluation.

## 2019-01-06 NOTE — Telephone Encounter (Signed)
Received vm message from Marvetta Gibbons @ [facility] with Dr. Janace Litten. Pt had CXR at his office yesterday and would like Dr. Julien Nordmann to review and note if there are any changes compared to previous CXRs.  Please return call to Dr. Unk Lightning office @ [phone] with any clinical input/advise

## 2019-01-07 ENCOUNTER — Other Ambulatory Visit: Payer: Self-pay | Admitting: Physician Assistant

## 2019-01-07 DIAGNOSIS — C3491 Malignant neoplasm of unspecified part of right bronchus or lung: Secondary | ICD-10-CM

## 2019-01-12 ENCOUNTER — Other Ambulatory Visit: Payer: Self-pay

## 2019-01-12 ENCOUNTER — Ambulatory Visit: Payer: Self-pay | Admitting: *Deleted

## 2019-01-12 ENCOUNTER — Encounter: Payer: Self-pay | Admitting: Internal Medicine

## 2019-01-12 ENCOUNTER — Telehealth: Payer: Self-pay

## 2019-01-12 ENCOUNTER — Inpatient Hospital Stay (HOSPITAL_BASED_OUTPATIENT_CLINIC_OR_DEPARTMENT_OTHER): Payer: BC Managed Care – PPO | Admitting: Internal Medicine

## 2019-01-12 ENCOUNTER — Inpatient Hospital Stay: Payer: BC Managed Care – PPO | Attending: Internal Medicine

## 2019-01-12 ENCOUNTER — Other Ambulatory Visit: Payer: BC Managed Care – PPO

## 2019-01-12 ENCOUNTER — Inpatient Hospital Stay: Payer: BC Managed Care – PPO

## 2019-01-12 VITALS — BP 124/88 | HR 94 | Temp 99.8°F | Resp 18 | Ht 68.0 in | Wt 185.4 lb

## 2019-01-12 DIAGNOSIS — C3401 Malignant neoplasm of right main bronchus: Secondary | ICD-10-CM | POA: Insufficient documentation

## 2019-01-12 DIAGNOSIS — Z5112 Encounter for antineoplastic immunotherapy: Secondary | ICD-10-CM

## 2019-01-12 DIAGNOSIS — Z7982 Long term (current) use of aspirin: Secondary | ICD-10-CM

## 2019-01-12 DIAGNOSIS — I10 Essential (primary) hypertension: Secondary | ICD-10-CM | POA: Insufficient documentation

## 2019-01-12 DIAGNOSIS — C3491 Malignant neoplasm of unspecified part of right bronchus or lung: Secondary | ICD-10-CM

## 2019-01-12 DIAGNOSIS — R112 Nausea with vomiting, unspecified: Secondary | ICD-10-CM | POA: Insufficient documentation

## 2019-01-12 DIAGNOSIS — Z7984 Long term (current) use of oral hypoglycemic drugs: Secondary | ICD-10-CM | POA: Insufficient documentation

## 2019-01-12 DIAGNOSIS — C779 Secondary and unspecified malignant neoplasm of lymph node, unspecified: Secondary | ICD-10-CM | POA: Diagnosis not present

## 2019-01-12 DIAGNOSIS — C3490 Malignant neoplasm of unspecified part of unspecified bronchus or lung: Secondary | ICD-10-CM

## 2019-01-12 DIAGNOSIS — N281 Cyst of kidney, acquired: Secondary | ICD-10-CM | POA: Insufficient documentation

## 2019-01-12 DIAGNOSIS — R1310 Dysphagia, unspecified: Secondary | ICD-10-CM | POA: Insufficient documentation

## 2019-01-12 DIAGNOSIS — R5383 Other fatigue: Secondary | ICD-10-CM

## 2019-01-12 DIAGNOSIS — J45909 Unspecified asthma, uncomplicated: Secondary | ICD-10-CM | POA: Insufficient documentation

## 2019-01-12 DIAGNOSIS — E119 Type 2 diabetes mellitus without complications: Secondary | ICD-10-CM | POA: Diagnosis not present

## 2019-01-12 DIAGNOSIS — Z79899 Other long term (current) drug therapy: Secondary | ICD-10-CM | POA: Diagnosis not present

## 2019-01-12 DIAGNOSIS — R221 Localized swelling, mass and lump, neck: Secondary | ICD-10-CM | POA: Insufficient documentation

## 2019-01-12 DIAGNOSIS — Z20822 Contact with and (suspected) exposure to covid-19: Secondary | ICD-10-CM

## 2019-01-12 LAB — CMP (CANCER CENTER ONLY)
ALT: 32 U/L (ref 0–44)
AST: 22 U/L (ref 15–41)
Albumin: 3.5 g/dL (ref 3.5–5.0)
Alkaline Phosphatase: 95 U/L (ref 38–126)
Anion gap: 9 (ref 5–15)
BUN: 12 mg/dL (ref 6–20)
CO2: 22 mmol/L (ref 22–32)
Calcium: 8.9 mg/dL (ref 8.9–10.3)
Chloride: 107 mmol/L (ref 98–111)
Creatinine: 0.81 mg/dL (ref 0.61–1.24)
GFR, Est AFR Am: >60 mL/min (ref >60)
GFR, Estimated: >60 mL/min (ref >60)
Glucose, Bld: 87 mg/dL (ref 70–99)
Potassium: 4.2 mmol/L (ref 3.5–5.1)
Sodium: 138 mmol/L (ref 135–145)
Total Bilirubin: 0.3 mg/dL (ref 0.3–1.2)
Total Protein: 6.6 g/dL (ref 6.5–8.1)

## 2019-01-12 LAB — CBC WITH DIFFERENTIAL (CANCER CENTER ONLY)
Abs Immature Granulocytes: 0.03 10*3/uL (ref 0.00–0.07)
Basophils Absolute: 0.1 10*3/uL (ref 0.0–0.1)
Basophils Relative: 1 %
Eosinophils Absolute: 0.4 10*3/uL (ref 0.0–0.5)
Eosinophils Relative: 5 %
HCT: 40.6 % (ref 39.0–52.0)
Hemoglobin: 13.6 g/dL (ref 13.0–17.0)
Immature Granulocytes: 0 %
Lymphocytes Relative: 7 %
Lymphs Abs: 0.6 10*3/uL — ABNORMAL LOW (ref 0.7–4.0)
MCH: 30.2 pg (ref 26.0–34.0)
MCHC: 33.5 g/dL (ref 30.0–36.0)
MCV: 90.2 fL (ref 80.0–100.0)
Monocytes Absolute: 1.3 10*3/uL — ABNORMAL HIGH (ref 0.1–1.0)
Monocytes Relative: 15 %
Neutro Abs: 6.1 10*3/uL (ref 1.7–7.7)
Neutrophils Relative %: 72 %
Platelet Count: 235 10*3/uL (ref 150–400)
RBC: 4.5 MIL/uL (ref 4.22–5.81)
RDW: 12 % (ref 11.5–15.5)
WBC Count: 8.5 10*3/uL (ref 4.0–10.5)
nRBC: 0 % (ref 0.0–0.2)

## 2019-01-12 LAB — TSH: TSH: 0.665 u[IU]/mL (ref 0.320–4.118)

## 2019-01-12 MED ORDER — SODIUM CHLORIDE 0.9 % IV SOLN
10.0000 mg/kg | Freq: Once | INTRAVENOUS | Status: AC
  Administered 2019-01-12: 860 mg via INTRAVENOUS
  Filled 2019-01-12: qty 10

## 2019-01-12 MED ORDER — SODIUM CHLORIDE 0.9 % IV SOLN
Freq: Once | INTRAVENOUS | Status: AC
  Administered 2019-01-12: 11:00:00 via INTRAVENOUS
  Filled 2019-01-12: qty 250

## 2019-01-12 NOTE — Telephone Encounter (Signed)
Drucie Ip a nurse with the [facility] called to request his test for COVID-19 time be changed to 1:45 or so because he is still getting his chemo treatment.  He is going to the [facility] location after his treatment.  I changed his time to later.

## 2019-01-12 NOTE — Patient Instructions (Signed)
[facility] Discharge Instructions for Patients Receiving Chemotherapy  Today you received the following chemotherapy agents:  durvalumab (Imfinzi)  To help prevent nausea and vomiting after your treatment, we encourage you to take your nausea medication as prescribed.   If you develop nausea and vomiting that is not controlled by your nausea medication, call the clinic.   BELOW ARE SYMPTOMS THAT SHOULD BE REPORTED IMMEDIATELY:  *FEVER GREATER THAN 100.5 F  *CHILLS WITH OR WITHOUT FEVER  NAUSEA AND VOMITING THAT IS NOT CONTROLLED WITH YOUR NAUSEA MEDICATION  *UNUSUAL SHORTNESS OF BREATH  *UNUSUAL BRUISING OR BLEEDING  TENDERNESS IN MOUTH AND THROAT WITH OR WITHOUT PRESENCE OF ULCERS  *URINARY PROBLEMS  *BOWEL PROBLEMS  UNUSUAL RASH Items with * indicate a potential emergency and should be followed up as soon as possible.  Feel free to call the clinic should you have any questions or concerns. The clinic phone number is [phone].  Please show the [facility] at check-in to the Emergency Department and triage nurse.

## 2019-01-12 NOTE — Telephone Encounter (Signed)
Call from [facility] Pt with symptoms and needs testing per [facility]. Pt scheduled and order placed.  Dr Curt Bears Office [phone] Fax [phone] RN Beth direct line [phone]

## 2019-01-12 NOTE — Addendum Note (Signed)
Addended by: Marijo Conception on: 01/12/2019 11:34 AM   Modules accepted: Orders

## 2019-01-12 NOTE — Progress Notes (Signed)
[place] [phone]   [phone]  OFFICE PROGRESS NOTE  [name], MD [address] [place] [address]  DIAGNOSIS: stage IIIB (T1c, N3, M0)non-small cell lung cancer, adenocarcinoma presented with right upper lobe lung mass in addition to mediastinal and bilateral supraclavicular lymphadenopathy diagnosed in January 2020. PD-L1: 10% Guardant 360 molecular studies showed no actionable mutation.  PRIOR THERAPY: Concurrent chemoradiation with weekly carboplatin for AUC of 2 and paclitaxel 45 mg/M2.  Status post 6 cycles.  Last dose was given on October 10, 2018 with stable disease.  CURRENT THERAPY:  Consolidation treatment with immunotherapy with Imfinzi 10 mg/KG every 2 weeks.  First dose November 17, 2018.  Status post 5 cycles.  INTERVAL HISTORY: Brian [name] 58 y.o. male returns to the clinic today for follow-up visit.  The patient is feeling fine today with no concerning complaints except for the persistent cough more in the morning and evening.  He has mild shortness of breath but no significant chest pain or hemoptysis.  He denied having any current fever or chills.  He denied having any weight loss or night sweats.  He has any nausea, vomiting, diarrhea or constipation.  He continues to tolerate his treatment with Imfinzi fairly well.  The patient is here today for evaluation before starting cycle #6.  MEDICAL HISTORY: Past Medical History:  Diagnosis Date  . Asthma   . Cancer ([facility])   . Diabetes mellitus without complication ([place])   . Hypertension   . Subarachnoid hemorrhage ([facility])     ALLERGIES:  has No Known Allergies.  MEDICATIONS:  Current Outpatient Medications  Medication Sig Dispense Refill  . amLODipine (NORVASC) 5 MG tablet Take by mouth.    Marland Kitchen atorvastatin (LIPITOR) 20 MG tablet Take by mouth.    . metFORMIN (GLUCOPHAGE) 500 MG tablet Take by mouth.    . prochlorperazine (COMPAZINE) 10 MG tablet TAKE 1  TABLET (10 MG TOTAL) BY MOUTH EVERY 6 (SIX) HOURS AS NEEDED FOR NAUSEA OR VOMITING. 30 tablet 0  . sucralfate (CARAFATE) 1 GM/10ML suspension Take 10 mLs (1 g total) by mouth 4 (four) times daily -  with meals and at bedtime. 3600 mL 1  . venlafaxine (EFFEXOR) 37.5 MG tablet TAKE 1 TABLET ONCE DAILY.     No current facility-administered medications for this visit.     SURGICAL HISTORY:  Past Surgical History:  Procedure Laterality Date  . LEG SURGERY  age 78    left leg, from fracture    REVIEW OF SYSTEMS:  A comprehensive review of systems was negative except for: Respiratory: positive for cough and dyspnea on exertion   PHYSICAL EXAMINATION: General appearance: alert, cooperative and no distress Head: Normocephalic, without obvious abnormality, atraumatic Neck: no adenopathy, no JVD, supple, symmetrical, trachea midline and thyroid not enlarged, symmetric, no tenderness/mass/nodules Lymph nodes: Cervical, supraclavicular, and axillary nodes normal. Resp: clear to auscultation bilaterally Back: symmetric, no curvature. ROM normal. No CVA tenderness. Cardio: regular rate and rhythm, S1, S2 normal, no murmur, click, rub or gallop GI: soft, non-tender; bowel sounds normal; no masses,  no organomegaly Extremities: extremities normal, atraumatic, no cyanosis or edema  ECOG PERFORMANCE STATUS: 1 - Symptomatic but completely ambulatory  Blood pressure 124/88, pulse 94, temperature 99.8 F (37.7 C), temperature source Oral, resp. rate 18, height '5\' 8"'  (1.727 m), weight 185 lb 6.4 oz (84.1 kg), SpO2 97 %.  LABORATORY DATA: Lab Results  Component Value Date  WBC 8.5 01/12/2019   HGB 13.6 01/12/2019   HCT 40.6 01/12/2019   MCV 90.2 01/12/2019   PLT 235 01/12/2019      Chemistry      Component Value Date/Time   NA 138 01/12/2019 0938   K 4.2 01/12/2019 0938   CL 107 01/12/2019 0938   CO2 22 01/12/2019 0938   BUN 12 01/12/2019 0938   CREATININE 0.81 01/12/2019 0938       Component Value Date/Time   CALCIUM 8.9 01/12/2019 0938   ALKPHOS 95 01/12/2019 0938   AST 22 01/12/2019 0938   ALT 32 01/12/2019 0938   BILITOT 0.3 01/12/2019 0938       RADIOGRAPHIC STUDIES: No results found.  ASSESSMENT AND PLAN: This is a very pleasant 58 years old white male with a stage IIIb non-small cell lung cancer, adenocarcinoma diagnosed in January 2020 with PDL 1 expression of 10% and no actionable mutations on Guardant 360 blood test. The patient underwent a course of concurrent chemoradiation with weekly carboplatin and paclitaxel status post 6 cycles.  He tolerated this course fairly well except for fatigue and odynophagia which significantly improved. The patient is currently undergoing consolidation treatment with Imfinzi 10 mg/KG every 2 weeks status post 5 cycles.  He has been tolerating this treatment well. I recommended for the patient to proceed with cycle #6 today as planned. I will see him back for follow-up visit in 2 weeks for evaluation after repeating CT scan of the chest for restaging of his disease. For the persistent cough and mild shortness of breath, we will test the patient for the Novel COVID19. He was advised to call immediately if he has any concerning symptoms in the interval. The patient voices understanding of current disease status and treatment options and is in agreement with the current care plan. All questions were answered. The patient knows to call the clinic with any problems, questions or concerns. We can certainly see the patient much sooner if necessary.  I spent 10 minutes counseling the patient face to face. The total time spent in the appointment was 15 minutes.  Disclaimer: This note was dictated with voice recognition software. Similar sounding words can inadvertently be transcribed and may not be corrected upon review.

## 2019-01-14 LAB — NOVEL CORONAVIRUS, NAA: SARS-CoV-2, NAA: NOT DETECTED

## 2019-01-24 ENCOUNTER — Other Ambulatory Visit: Payer: Self-pay

## 2019-01-24 ENCOUNTER — Ambulatory Visit (HOSPITAL_COMMUNITY)
Admission: RE | Admit: 2019-01-24 | Discharge: 2019-01-24 | Disposition: A | Discharge status: Home / Self Care | Payer: BC Managed Care – PPO | Source: Ambulatory Visit | Attending: Internal Medicine | Admitting: Internal Medicine

## 2019-01-24 DIAGNOSIS — C3490 Malignant neoplasm of unspecified part of unspecified bronchus or lung: Secondary | ICD-10-CM | POA: Insufficient documentation

## 2019-01-24 MED ORDER — IOHEXOL 300 MG/ML  SOLN
75.0000 mL | Freq: Once | INTRAMUSCULAR | Status: AC | PRN
  Administered 2019-01-24: 75 mL via INTRAVENOUS

## 2019-01-24 MED ORDER — SODIUM CHLORIDE (PF) 0.9 % IJ SOLN
INTRAMUSCULAR | Status: AC
  Filled 2019-01-24: qty 50

## 2019-01-26 ENCOUNTER — Inpatient Hospital Stay: Payer: BC Managed Care – PPO

## 2019-01-26 ENCOUNTER — Other Ambulatory Visit: Payer: Self-pay

## 2019-01-26 ENCOUNTER — Inpatient Hospital Stay (HOSPITAL_BASED_OUTPATIENT_CLINIC_OR_DEPARTMENT_OTHER): Payer: BC Managed Care – PPO | Admitting: Physician Assistant

## 2019-01-26 VITALS — BP 123/89 | HR 100 | Temp 98.9°F | Resp 18 | Ht 68.0 in | Wt 190.6 lb

## 2019-01-26 DIAGNOSIS — C779 Secondary and unspecified malignant neoplasm of lymph node, unspecified: Secondary | ICD-10-CM

## 2019-01-26 DIAGNOSIS — Z7982 Long term (current) use of aspirin: Secondary | ICD-10-CM

## 2019-01-26 DIAGNOSIS — I10 Essential (primary) hypertension: Secondary | ICD-10-CM

## 2019-01-26 DIAGNOSIS — Z5112 Encounter for antineoplastic immunotherapy: Secondary | ICD-10-CM | POA: Diagnosis not present

## 2019-01-26 DIAGNOSIS — N281 Cyst of kidney, acquired: Secondary | ICD-10-CM

## 2019-01-26 DIAGNOSIS — Z79899 Other long term (current) drug therapy: Secondary | ICD-10-CM

## 2019-01-26 DIAGNOSIS — Z7984 Long term (current) use of oral hypoglycemic drugs: Secondary | ICD-10-CM

## 2019-01-26 DIAGNOSIS — C3491 Malignant neoplasm of unspecified part of right bronchus or lung: Secondary | ICD-10-CM

## 2019-01-26 DIAGNOSIS — R221 Localized swelling, mass and lump, neck: Secondary | ICD-10-CM

## 2019-01-26 DIAGNOSIS — R112 Nausea with vomiting, unspecified: Secondary | ICD-10-CM

## 2019-01-26 DIAGNOSIS — J45909 Unspecified asthma, uncomplicated: Secondary | ICD-10-CM

## 2019-01-26 DIAGNOSIS — C3401 Malignant neoplasm of right main bronchus: Secondary | ICD-10-CM | POA: Diagnosis not present

## 2019-01-26 DIAGNOSIS — E119 Type 2 diabetes mellitus without complications: Secondary | ICD-10-CM

## 2019-01-26 DIAGNOSIS — R5383 Other fatigue: Secondary | ICD-10-CM

## 2019-01-26 DIAGNOSIS — R1310 Dysphagia, unspecified: Secondary | ICD-10-CM

## 2019-01-26 LAB — CMP (CANCER CENTER ONLY)
ALT: 55 U/L — ABNORMAL HIGH (ref 0–44)
AST: 28 U/L (ref 15–41)
Albumin: 3.6 g/dL (ref 3.5–5.0)
Alkaline Phosphatase: 90 U/L (ref 38–126)
Anion gap: 11 (ref 5–15)
BUN: 14 mg/dL (ref 6–20)
CO2: 23 mmol/L (ref 22–32)
Calcium: 8.6 mg/dL — ABNORMAL LOW (ref 8.9–10.3)
Chloride: 104 mmol/L (ref 98–111)
Creatinine: 0.95 mg/dL (ref 0.61–1.24)
GFR, Est AFR Am: >60 mL/min (ref >60)
GFR, Estimated: >60 mL/min (ref >60)
Glucose, Bld: 132 mg/dL — ABNORMAL HIGH (ref 70–99)
Potassium: 3.7 mmol/L (ref 3.5–5.1)
Sodium: 138 mmol/L (ref 135–145)
Total Bilirubin: <0.2 mg/dL — ABNORMAL LOW (ref 0.3–1.2)
Total Protein: 6.5 g/dL (ref 6.5–8.1)

## 2019-01-26 LAB — CBC WITH DIFFERENTIAL (CANCER CENTER ONLY)
Abs Immature Granulocytes: 0.03 10*3/uL (ref 0.00–0.07)
Basophils Absolute: 0.1 10*3/uL (ref 0.0–0.1)
Basophils Relative: 1 %
Eosinophils Absolute: 0.3 10*3/uL (ref 0.0–0.5)
Eosinophils Relative: 4 %
HCT: 41.9 % (ref 39.0–52.0)
Hemoglobin: 13.7 g/dL (ref 13.0–17.0)
Immature Granulocytes: 0 %
Lymphocytes Relative: 8 %
Lymphs Abs: 0.7 10*3/uL (ref 0.7–4.0)
MCH: 30.2 pg (ref 26.0–34.0)
MCHC: 32.7 g/dL (ref 30.0–36.0)
MCV: 92.5 fL (ref 80.0–100.0)
Monocytes Absolute: 1.2 10*3/uL — ABNORMAL HIGH (ref 0.1–1.0)
Monocytes Relative: 14 %
Neutro Abs: 6.4 10*3/uL (ref 1.7–7.7)
Neutrophils Relative %: 73 %
Platelet Count: 200 10*3/uL (ref 150–400)
RBC: 4.53 MIL/uL (ref 4.22–5.81)
RDW: 12.3 % (ref 11.5–15.5)
WBC Count: 8.7 10*3/uL (ref 4.0–10.5)
nRBC: 0 % (ref 0.0–0.2)

## 2019-01-26 MED ORDER — SODIUM CHLORIDE 0.9 % IV SOLN
10.0000 mg/kg | Freq: Once | INTRAVENOUS | Status: AC
  Administered 2019-01-26: 860 mg via INTRAVENOUS
  Filled 2019-01-26: qty 10

## 2019-01-26 MED ORDER — SODIUM CHLORIDE 0.9 % IV SOLN
Freq: Once | INTRAVENOUS | Status: AC
  Administered 2019-01-26: 14:00:00 via INTRAVENOUS
  Filled 2019-01-26: qty 250

## 2019-01-26 NOTE — Patient Instructions (Signed)
[facility] Discharge Instructions for Patients Receiving Chemotherapy  Today you received the following chemotherapy agents:  durvalumab (Imfinzi)  To help prevent nausea and vomiting after your treatment, we encourage you to take your nausea medication as prescribed.   If you develop nausea and vomiting that is not controlled by your nausea medication, call the clinic.   BELOW ARE SYMPTOMS THAT SHOULD BE REPORTED IMMEDIATELY:  *FEVER GREATER THAN 100.5 F  *CHILLS WITH OR WITHOUT FEVER  NAUSEA AND VOMITING THAT IS NOT CONTROLLED WITH YOUR NAUSEA MEDICATION  *UNUSUAL SHORTNESS OF BREATH  *UNUSUAL BRUISING OR BLEEDING  TENDERNESS IN MOUTH AND THROAT WITH OR WITHOUT PRESENCE OF ULCERS  *URINARY PROBLEMS  *BOWEL PROBLEMS  UNUSUAL RASH Items with * indicate a potential emergency and should be followed up as soon as possible.  Feel free to call the clinic should you have any questions or concerns. The clinic phone number is [phone].  Please show the [facility] at check-in to the Emergency Department and triage nurse.

## 2019-01-26 NOTE — Progress Notes (Signed)
[facility] OFFICE PROGRESS NOTE  Brian Broker, MD [address] [place] [address]  DIAGNOSIS: Stage IIIB (T1c, N3, M0)non-small cell lung cancer, adenocarcinoma presented with right upper lobe lung mass in addition to mediastinal and bilateral supraclavicular lymphadenopathy diagnosed in January 2020. PD-L1: 10% Guardant 360 molecular studies showed no actionable mutation.  PRIOR THERAPY: Concurrent chemoradiation with weekly carboplatin for AUC of 2 and paclitaxel 45 mg/M2.Status post 6 cycles.  Last dose was given on October 10, 2018 with stable disease.  CURRENT THERAPY: Consolidation treatment with immunotherapy with Imfinzi 10 mg/KG every 2 weeks.  First dose November 17, 2018.  Status post 5 cycles.  INTERVAL HISTORY: Brian [name] 58 y.o. male returns to the clinic for follow-up visit.  The patient is feeling well today without any concerning complaints except for a non tender lump on his right side of his neck. He believes this to be new and different from his prior palpable lymphadenopathy. He recently saw his PCP in May for a productive cough, shortness of breath, and pleuritic chest pain. He had a chest x ray performed and was treated for pneumonia. He was treated with cefdinir, rocephin, and azithromycin. He also was recently tested for COVID-19 which was negative.  Since this time, his breathing has improved. He denies shortness of breath, chest pain, fevers, chills, hemoptysis, or night sweats. He still has a mild cough but states this has improved from prior. He denies any weight loss. He denies any nausea, vomiting, diarrhea, or constipation.  He denies any headache or visual changes.  He denies any rashes or skin changes.  He recently had a restaging CT scan performed.  He is here today for evaluation before starting cycle #6.  MEDICAL HISTORY: Past Medical History:  Diagnosis Date  . Asthma   . Cancer ([place])   . Diabetes mellitus without  complication ([place])   . Hypertension   . Subarachnoid hemorrhage ([facility])     ALLERGIES:  has No Known Allergies.  MEDICATIONS:  Current Outpatient Medications  Medication Sig Dispense Refill  . amLODipine (NORVASC) 5 MG tablet Take by mouth.    Marland Kitchen atorvastatin (LIPITOR) 20 MG tablet Take by mouth.    . metFORMIN (GLUCOPHAGE) 500 MG tablet Take by mouth.    . prochlorperazine (COMPAZINE) 10 MG tablet TAKE 1 TABLET (10 MG TOTAL) BY MOUTH EVERY 6 (SIX) HOURS AS NEEDED FOR NAUSEA OR VOMITING. 30 tablet 0  . sucralfate (CARAFATE) 1 GM/10ML suspension Take 10 mLs (1 g total) by mouth 4 (four) times daily -  with meals and at bedtime. 3600 mL 1  . venlafaxine (EFFEXOR) 37.5 MG tablet TAKE 1 TABLET ONCE DAILY.     No current facility-administered medications for this visit.    Facility-Administered Medications Ordered in Other Visits  Medication Dose Route Frequency Provider Last Rate Last Dose  . durvalumab (IMFINZI) 860 mg in sodium chloride 0.9 % 100 mL chemo infusion  10 mg/kg (Treatment Plan Recorded) Intravenous Once Curt Bears, MD 117 mL/hr at 01/26/19 1501 860 mg at 01/26/19 1501    SURGICAL HISTORY:  Past Surgical History:  Procedure Laterality Date  . LEG SURGERY  age 37    left leg, from fracture    REVIEW OF SYSTEMS:   Review of Systems  Constitutional: Negative for appetite change, chills, fatigue, fever and unexpected weight change.  HENT:   Negative for mouth sores, nosebleeds, sore throat and trouble swallowing.   Eyes: Negative for eye problems  and icterus.  Respiratory: Positive for mild (improved) cough. Negative for hemoptysis, shortness of breath and wheezing.   Cardiovascular: Negative for chest pain and leg swelling.  Gastrointestinal: Negative for abdominal pain, constipation, diarrhea, nausea and vomiting.  Genitourinary: Negative for bladder incontinence, difficulty urinating, dysuria, frequency and hematuria.   Musculoskeletal: Negative for back pain, gait  problem, neck pain and neck stiffness.  Skin: Negative for itching and rash.  Neurological: Negative for dizziness, extremity weakness, gait problem, headaches, light-headedness and seizures.  Hematological: Negative for adenopathy. Does not bruise/bleed easily.  Psychiatric/Behavioral: Negative for confusion, depression and sleep disturbance. The patient is not nervous/anxious.     PHYSICAL EXAMINATION:  Blood pressure 123/89, pulse 100, temperature 98.9 F (37.2 C), temperature source Oral, resp. rate 18, height 5' 8" (1.727 m), weight 190 lb 9.6 oz (86.5 kg), SpO2 98 %.  ECOG PERFORMANCE STATUS: 1 - Symptomatic but completely ambulatory  Physical Exam  Constitutional: Oriented to person, place, and time and well-developed, well-nourished, and in no distress.  HENT:  Head: Normocephalic and atraumatic.  Mouth/Throat: Oropharynx is clear and moist. No oropharyngeal exudate.  Eyes: Conjunctivae are normal. Right eye exhibits no discharge. Left eye exhibits no discharge. No scleral icterus.  Neck: Normal range of motion. Neck supple.  Cardiovascular: Normal rate, regular rhythm, normal heart sounds and intact distal pulses.   Pulmonary/Chest: Effort normal and breath sounds normal. No respiratory distress. No wheezes. No rales.  Abdominal: Soft. Bowel sounds are normal. Exhibits no distension and no mass. There is no tenderness.  Musculoskeletal: Normal range of motion. Exhibits no edema.  Lymphadenopathy:    No cervical adenopathy.  Neurological: Alert and oriented to person, place, and time. Exhibits normal muscle tone. Gait normal. Coordination normal.  Skin: Skin is warm and dry. No rash noted. Not diaphoretic. No erythema. No pallor.  Psychiatric: Mood, memory and judgment normal.  Vitals reviewed.  LABORATORY DATA: Lab Results  Component Value Date   WBC 8.7 01/26/2019   HGB 13.7 01/26/2019   HCT 41.9 01/26/2019   MCV 92.5 01/26/2019   PLT 200 01/26/2019      Chemistry       Component Value Date/Time   NA 138 01/26/2019 1229   K 3.7 01/26/2019 1229   CL 104 01/26/2019 1229   CO2 23 01/26/2019 1229   BUN 14 01/26/2019 1229   CREATININE 0.95 01/26/2019 1229      Component Value Date/Time   CALCIUM 8.6 (L) 01/26/2019 1229   ALKPHOS 90 01/26/2019 1229   AST 28 01/26/2019 1229   ALT 55 (H) 01/26/2019 1229   BILITOT <0.2 (L) 01/26/2019 1229       RADIOGRAPHIC STUDIES:  Ct Chest W Contrast  Result Date: 01/24/2019 CLINICAL DATA:  Stage IIIB right upper lobe lung adenocarcinoma diagnosed January 2020 status post concurrent chemo radiation therapy with ongoing consolidation immunotherapy. EXAM: CT CHEST WITH CONTRAST TECHNIQUE: Multidetector CT imaging of the chest was performed during intravenous contrast administration. CONTRAST:  64m OMNIPAQUE IOHEXOL 300 MG/ML  SOLN COMPARISON:  11/07/2018 chest CT. FINDINGS: Cardiovascular: Normal heart size. No significant pericardial effusion/thickening. Left anterior descending coronary atherosclerosis. Minimally atherosclerotic nonaneurysmal thoracic aorta. Normal caliber pulmonary arteries. No central pulmonary emboli. Mediastinum/Nodes: No discrete thyroid nodules. Unremarkable esophagus. No axillary adenopathy. Mildly enlarged 1.1 cm left supraclavicular node (series 2/image 19), mildly decreased from 1.5 cm using similar measurement technique 11/07/2018 CT. Right supraclavicular lymph nodes have decreased in size, largest 0.7 cm, decreased from 1.1 cm. Stable mild right paratracheal adenopathy  measuring up to 1.0 cm (series 2/image 60). No new pathologically enlarged mediastinal nodes. Stable mildly enlarged 1.1 cm right hilar node (series 2/image 68). No left hilar adenopathy. Lungs/Pleura: No pneumothorax. New trace dependent right pleural effusion. No left pleural effusion. Sharply marginated dense right perihilar consolidation with surrounding patchy ground-glass opacity in the perihilar mid to upper right lung,  largely new, obscuring treated right upper lobe pulmonary nodule and associated with volume loss, distortion and mild bronchiectasis, compatible with evolving postradiation change. Minimal patchy paramediastinal ground-glass opacity in lower lobes bilaterally is also favored to represent evolving postradiation change. No new significant pulmonary nodules. Upper abdomen: Simple 1.1 cm upper left renal cyst. Musculoskeletal: No aggressive appearing focal osseous lesions. Minimal thoracic spondylosis. IMPRESSION: 1. Evolving right perihilar postradiation change obscuring the treated right upper lobe pulmonary nodule. 2. Mild right hilar and right mediastinal lymphadenopathy is stable. Bilateral supraclavicular adenopathy is decreased. No new or progressive metastatic disease in the chest. Aortic Atherosclerosis (ICD10-I70.0). Electronically Signed   By: Ilona Sorrel M.D.   On: 01/24/2019 16:18     ASSESSMENT/PLAN:  This is a very pleasant 58 year old Caucasian male diagnosed with stage IIIb non-small cell lung cancer, adenocarcinoma.He presented with a right upper lobe lung mass in addition to mediastinal and bilateral supraclavicular lymphadenopathy. He was diagnosed in January 2020. His PDL 1 expression is 10% and he has no actionable mutations.  He underwent a course of concurrent chemoradiation with weekly carboplatin and paclitaxel. He is status post 6 cycles. He tolerated well except for fatigue and odynophagia.  He is currently undergoing consolidation immunotherapy with Imfinzi 10 mg/kg IV every 2 weeks. He is status post 5 cycles of treatment. He tolerated it well without any adverse side effects.   The patient recently had a restaging CT scan performed.  Dr. Julien Nordmann personally and independently reviewed the scan and discussed the results with the patient today.  The scan showed no evidence of new or progressive disease in the chest. The scan did show post radiation changes. Dr. Julien Nordmann  recommends that the patient proceed with cycle #6 today scheduled.  Regarding the new lymphadenopathy in the right neck, Dr. Julien Nordmann and I reviewed prior PET and CT scans. I will arrange for the patient to have a CT of the neck to further evaluate his new right cervical lymphadenopathy.   We will see the patient back for follow-up visit in 2 weeks evaluation and to review his CT neck before starting for cycle #7.  The patient was advised to call immediately if he has any concerning symptoms in the interval. The patient voices understanding of current disease status and treatment options and is in agreement with the current care plan. All questions were answered. The patient knows to call the clinic with any problems, questions or concerns. We can certainly see the patient much sooner if necessary  Orders Placed This Encounter  Procedures  . CT Soft Tissue Neck W Contrast    Please compare to previous PET scan. New suspicious lymph node on physical exam.    Standing Status:   Future    Standing Expiration Date:   01/26/2020    Order Specific Question:   ** REASON FOR EXAM (FREE TEXT)    Answer:   Lung Cancer with suspicious new lymph node on physical exam    Order Specific Question:   If indicated for the ordered procedure, I authorize the administration of contrast media per Radiology protocol    Answer:   Yes  Order Specific Question:   Preferred imaging location?    Answer:   [facility]    Order Specific Question:   Radiology Contrast Protocol - do NOT remove file path    Answer:   _[email or web address]     Cassandra L Heilingoetter, PA-C 01/26/19  ADDENDUM: Hematology/Oncology Attending: I had a face-to-face encounter with the patient.  I recommended his care plan.  This is a very pleasant 58 years old white male with history of stage IIIb non-small cell lung cancer, adenocarcinoma with no actionable mutation is status post a course of concurrent  chemoradiation with weekly carboplatin and paclitaxel.  He is currently undergoing consolidation treatment with immunotherapy with Imfinzi status post 6 cycles.  The patient has been tolerating this treatment well but he has more cough and shortness of breath recently.  He was tested for COVID-19 and this was reported to be negative. The patient has started feeling a little bit better in the last 2 weeks. He had repeat CT scan of the chest performed recently.  I personally and independently reviewed the scan images and discussed the result and showed the images to the patient today. 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  The patient felt a small nodule in the right cervical area of the neck.  This is concerning for metastatic disease to this lesion. I recommended for the patient to continue his current treatment with Imfinzi for now and we will order CT scan of the neck for further evaluation of this lesion. He will come back for follow-up visit in 2 weeks for evaluation before starting cycle #8. He was advised to call immediately if he has any concerning symptoms in the interval.  Disclaimer: This note was dictated with voice recognition software. Similar sounding words can inadvertently be transcribed and may be missed upon review.  Eilleen Kempf, MD 01/27/19

## 2019-01-27 ENCOUNTER — Encounter: Payer: Self-pay | Admitting: Physician Assistant

## 2019-01-27 ENCOUNTER — Telehealth: Payer: Self-pay | Admitting: Internal Medicine

## 2019-01-27 NOTE — Telephone Encounter (Signed)
Added additional cycles per 6/25 los - pt to get an updated schedule next visit.

## 2019-02-06 ENCOUNTER — Encounter (HOSPITAL_COMMUNITY): Payer: Self-pay

## 2019-02-06 ENCOUNTER — Ambulatory Visit (HOSPITAL_COMMUNITY)
Admission: RE | Admit: 2019-02-06 | Discharge: 2019-02-06 | Disposition: A | Discharge status: Home / Self Care | Payer: BC Managed Care – PPO | Source: Ambulatory Visit | Attending: Physician Assistant | Admitting: Physician Assistant

## 2019-02-06 ENCOUNTER — Other Ambulatory Visit: Payer: Self-pay

## 2019-02-06 DIAGNOSIS — C3491 Malignant neoplasm of unspecified part of right bronchus or lung: Secondary | ICD-10-CM | POA: Diagnosis not present

## 2019-02-06 DIAGNOSIS — R590 Localized enlarged lymph nodes: Secondary | ICD-10-CM | POA: Diagnosis not present

## 2019-02-06 DIAGNOSIS — C3490 Malignant neoplasm of unspecified part of unspecified bronchus or lung: Secondary | ICD-10-CM | POA: Diagnosis not present

## 2019-02-06 MED ORDER — SODIUM CHLORIDE (PF) 0.9 % IJ SOLN
INTRAMUSCULAR | Status: AC
  Filled 2019-02-06: qty 50

## 2019-02-06 MED ORDER — IOHEXOL 300 MG/ML  SOLN
75.0000 mL | Freq: Once | INTRAMUSCULAR | Status: AC | PRN
  Administered 2019-02-06: 75 mL via INTRAVENOUS

## 2019-02-09 ENCOUNTER — Inpatient Hospital Stay (HOSPITAL_BASED_OUTPATIENT_CLINIC_OR_DEPARTMENT_OTHER): Payer: BC Managed Care – PPO | Admitting: Internal Medicine

## 2019-02-09 ENCOUNTER — Telehealth: Payer: Self-pay | Admitting: Physician Assistant

## 2019-02-09 ENCOUNTER — Other Ambulatory Visit: Payer: Self-pay

## 2019-02-09 ENCOUNTER — Inpatient Hospital Stay: Payer: BC Managed Care – PPO | Attending: Internal Medicine

## 2019-02-09 ENCOUNTER — Inpatient Hospital Stay: Payer: BC Managed Care – PPO

## 2019-02-09 ENCOUNTER — Encounter: Payer: Self-pay | Admitting: Internal Medicine

## 2019-02-09 ENCOUNTER — Other Ambulatory Visit: Payer: Self-pay | Admitting: Internal Medicine

## 2019-02-09 VITALS — BP 118/84 | HR 87

## 2019-02-09 VITALS — BP 112/91 | HR 94 | Temp 98.9°F | Resp 18 | Ht 68.0 in | Wt 188.8 lb

## 2019-02-09 DIAGNOSIS — N281 Cyst of kidney, acquired: Secondary | ICD-10-CM | POA: Diagnosis not present

## 2019-02-09 DIAGNOSIS — C3491 Malignant neoplasm of unspecified part of right bronchus or lung: Secondary | ICD-10-CM

## 2019-02-09 DIAGNOSIS — C3411 Malignant neoplasm of upper lobe, right bronchus or lung: Secondary | ICD-10-CM

## 2019-02-09 DIAGNOSIS — E119 Type 2 diabetes mellitus without complications: Secondary | ICD-10-CM

## 2019-02-09 DIAGNOSIS — J479 Bronchiectasis, uncomplicated: Secondary | ICD-10-CM | POA: Diagnosis not present

## 2019-02-09 DIAGNOSIS — I10 Essential (primary) hypertension: Secondary | ICD-10-CM | POA: Diagnosis not present

## 2019-02-09 DIAGNOSIS — Z5112 Encounter for antineoplastic immunotherapy: Secondary | ICD-10-CM | POA: Insufficient documentation

## 2019-02-09 DIAGNOSIS — J90 Pleural effusion, not elsewhere classified: Secondary | ICD-10-CM | POA: Insufficient documentation

## 2019-02-09 DIAGNOSIS — R599 Enlarged lymph nodes, unspecified: Secondary | ICD-10-CM

## 2019-02-09 DIAGNOSIS — Z79899 Other long term (current) drug therapy: Secondary | ICD-10-CM | POA: Diagnosis not present

## 2019-02-09 DIAGNOSIS — R590 Localized enlarged lymph nodes: Secondary | ICD-10-CM

## 2019-02-09 DIAGNOSIS — R1310 Dysphagia, unspecified: Secondary | ICD-10-CM | POA: Diagnosis not present

## 2019-02-09 DIAGNOSIS — Z7984 Long term (current) use of oral hypoglycemic drugs: Secondary | ICD-10-CM

## 2019-02-09 LAB — CMP (CANCER CENTER ONLY)
ALT: 54 U/L — ABNORMAL HIGH (ref 0–44)
AST: 27 U/L (ref 15–41)
Albumin: 3.6 g/dL (ref 3.5–5.0)
Alkaline Phosphatase: 92 U/L (ref 38–126)
Anion gap: 9 (ref 5–15)
BUN: 18 mg/dL (ref 6–20)
CO2: 22 mmol/L (ref 22–32)
Calcium: 8.8 mg/dL — ABNORMAL LOW (ref 8.9–10.3)
Chloride: 108 mmol/L (ref 98–111)
Creatinine: 0.8 mg/dL (ref 0.61–1.24)
GFR, Est AFR Am: >60 mL/min (ref >60)
GFR, Estimated: >60 mL/min (ref >60)
Glucose, Bld: 109 mg/dL — ABNORMAL HIGH (ref 70–99)
Potassium: 4.4 mmol/L (ref 3.5–5.1)
Sodium: 139 mmol/L (ref 135–145)
Total Bilirubin: 0.2 mg/dL — ABNORMAL LOW (ref 0.3–1.2)
Total Protein: 6.6 g/dL (ref 6.5–8.1)

## 2019-02-09 LAB — CBC WITH DIFFERENTIAL (CANCER CENTER ONLY)
Abs Immature Granulocytes: 0.03 10*3/uL (ref 0.00–0.07)
Basophils Absolute: 0.1 10*3/uL (ref 0.0–0.1)
Basophils Relative: 1 %
Eosinophils Absolute: 0.2 10*3/uL (ref 0.0–0.5)
Eosinophils Relative: 3 %
HCT: 43.1 % (ref 39.0–52.0)
Hemoglobin: 14.2 g/dL (ref 13.0–17.0)
Immature Granulocytes: 0 %
Lymphocytes Relative: 6 %
Lymphs Abs: 0.6 10*3/uL — ABNORMAL LOW (ref 0.7–4.0)
MCH: 29.5 pg (ref 26.0–34.0)
MCHC: 32.9 g/dL (ref 30.0–36.0)
MCV: 89.6 fL (ref 80.0–100.0)
Monocytes Absolute: 1.3 10*3/uL — ABNORMAL HIGH (ref 0.1–1.0)
Monocytes Relative: 14 %
Neutro Abs: 6.9 10*3/uL (ref 1.7–7.7)
Neutrophils Relative %: 76 %
Platelet Count: 199 10*3/uL (ref 150–400)
RBC: 4.81 MIL/uL (ref 4.22–5.81)
RDW: 12.4 % (ref 11.5–15.5)
WBC Count: 9.1 10*3/uL (ref 4.0–10.5)
nRBC: 0 % (ref 0.0–0.2)

## 2019-02-09 LAB — TSH: TSH: 0.091 u[IU]/mL — ABNORMAL LOW (ref 0.320–4.118)

## 2019-02-09 MED ORDER — SODIUM CHLORIDE 0.9 % IV SOLN
Freq: Once | INTRAVENOUS | Status: AC
  Administered 2019-02-09: 10:00:00 via INTRAVENOUS
  Filled 2019-02-09: qty 250

## 2019-02-09 MED ORDER — SODIUM CHLORIDE 0.9 % IV SOLN
10.0000 mg/kg | Freq: Once | INTRAVENOUS | Status: AC
  Administered 2019-02-09: 860 mg via INTRAVENOUS
  Filled 2019-02-09: qty 10

## 2019-02-09 NOTE — Telephone Encounter (Signed)
Scheduled appt per 7/09  [facility] - pt to get an updated schedule next visit.

## 2019-02-09 NOTE — Addendum Note (Signed)
Addended by: Ardeen Garland on: 02/09/2019 10:06 AM   Modules accepted: Orders

## 2019-02-09 NOTE — Patient Instructions (Signed)
[place] Discharge Instructions for Patients Receiving Chemotherapy  Today you received the following chemotherapy agents Durvalumab (IMFINZI).  To help prevent nausea and vomiting after your treatment, we encourage you to take your nausea medication as prescribed.   If you develop nausea and vomiting that is not controlled by your nausea medication, call the clinic.   BELOW ARE SYMPTOMS THAT SHOULD BE REPORTED IMMEDIATELY:  *FEVER GREATER THAN 100.5 F  *CHILLS WITH OR WITHOUT FEVER  NAUSEA AND VOMITING THAT IS NOT CONTROLLED WITH YOUR NAUSEA MEDICATION  *UNUSUAL SHORTNESS OF BREATH  *UNUSUAL BRUISING OR BLEEDING  TENDERNESS IN MOUTH AND THROAT WITH OR WITHOUT PRESENCE OF ULCERS  *URINARY PROBLEMS  *BOWEL PROBLEMS  UNUSUAL RASH Items with * indicate a potential emergency and should be followed up as soon as possible.  Feel free to call the clinic should you have any questions or concerns. The clinic phone number is [phone].  Please show the [facility] at check-in to the Emergency Department and triage nurse.  Coronavirus (COVID-19) Are you at risk?  Are you at risk for the Coronavirus (COVID-19)?  To be considered HIGH RISK for Coronavirus (COVID-19), you have to meet the following criteria:  . Traveled to [place], [place], [place], [place] or [place]; or in the [place] to [place], [place], [place], or [place]; and have fever, cough, and shortness of breath within the last 2 weeks of travel OR . Been in close contact with a person diagnosed with COVID-19 within the last 2 weeks and have fever, cough, and shortness of breath . IF YOU DO NOT MEET THESE CRITERIA, YOU ARE CONSIDERED LOW RISK FOR COVID-19.  What to do if you are HIGH RISK for COVID-19?  Marland Kitchen If you are having a medical emergency, call 911. . Seek medical care right away. Before you go to a doctor's office, urgent care or emergency department, call ahead and tell them  about your recent travel, contact with someone diagnosed with COVID-19, and your symptoms. You should receive instructions from your physician's office regarding next steps of care.  . When you arrive at healthcare provider, tell the healthcare staff immediately you have returned from visiting [place], [place], [place], [place] or [place]; or traveled in the [place] to [place], [place], [place], or [place]; in the last two weeks or you have been in close contact with a person diagnosed with COVID-19 in the last 2 weeks.   . Tell the health care staff about your symptoms: fever, cough and shortness of breath. . After you have been seen by a medical provider, you will be either: o Tested for (COVID-19) and discharged home on quarantine except to seek medical care if symptoms worsen, and asked to  - Stay home and avoid contact with others until you get your results (4-5 days)  - Avoid travel on public transportation if possible (such as bus, train, or airplane) or o Sent to the Emergency Department by EMS for evaluation, COVID-19 testing, and possible admission depending on your condition and test results.  What to do if you are LOW RISK for COVID-19?  Reduce your risk of any infection by using the same precautions used for avoiding the common cold or flu:  Marland Kitchen Wash your hands often with soap and warm water for at least 20 seconds.  If soap and water are not readily available, use an alcohol-based hand sanitizer with at least 60% alcohol.  . If coughing or  sneezing, cover your mouth and nose by coughing or sneezing into the elbow areas of your shirt or coat, into a tissue or into your sleeve (not your hands). . Avoid shaking hands with others and consider head nods or verbal greetings only. . Avoid touching your eyes, nose, or mouth with unwashed hands.  . Avoid close contact with people who are sick. . Avoid places or events with large numbers of people in one location, like concerts or  sporting events. . Carefully consider travel plans you have or are making. . If you are planning any travel outside or inside the [place], visit the CDC's Travelers' Health webpage for the latest health notices. . If you have some symptoms but not all symptoms, continue to monitor at home and seek medical attention if your symptoms worsen. . If you are having a medical emergency, call 911.   [place] / e-Visit: [email or web address]         [facility]: [facility]: [phone]                   [facility]: [phone]

## 2019-02-09 NOTE — Progress Notes (Signed)
[place] [phone]   [phone]  OFFICE PROGRESS NOTE  [name], MD [address] [place] [address]  DIAGNOSIS: stage IIIB (T1c, N3, M0)non-small cell lung cancer, adenocarcinoma presented with right upper lobe lung mass in addition to mediastinal and bilateral supraclavicular lymphadenopathy diagnosed in January 2020. PD-L1: 10% Guardant 360 molecular studies showed no actionable mutation.  PRIOR THERAPY: Concurrent chemoradiation with weekly carboplatin for AUC of 2 and paclitaxel 45 mg/M2.  Status post 6 cycles.  Last dose was given on October 10, 2018 with stable disease.  CURRENT THERAPY:  Consolidation treatment with immunotherapy with Imfinzi 10 mg/KG every 2 weeks.  First dose November 17, 2018.  Status post 6 cycles.  INTERVAL HISTORY: Brian [name] 58 y.o. male returns to the clinic today for follow-up visit.  The patient is feeling fine today with no concerning complaints except for occasional episodes of cough.  He denied having any shortness of breath, chest pain or hemoptysis.  He denied having any fever or chills.  He has no nausea, vomiting, diarrhea or constipation.  He denied having any headache or visual changes.  He has no weight loss or night sweats.  He continues to tolerate his treatment with Imfinzi fairly well.  The patient is here today for evaluation with repeat CT scan of the neck.  MEDICAL HISTORY: Past Medical History:  Diagnosis Date   Asthma    Cancer ([place])    Diabetes mellitus without complication ([place])    Hypertension    Subarachnoid hemorrhage ([place])     ALLERGIES:  has No Known Allergies.  MEDICATIONS:  Current Outpatient Medications  Medication Sig Dispense Refill   amLODipine (NORVASC) 5 MG tablet Take by mouth.     atorvastatin (LIPITOR) 20 MG tablet Take by mouth.     metFORMIN (GLUCOPHAGE) 500 MG tablet Take by mouth.     prochlorperazine (COMPAZINE) 10 MG tablet TAKE  1 TABLET (10 MG TOTAL) BY MOUTH EVERY 6 (SIX) HOURS AS NEEDED FOR NAUSEA OR VOMITING. 30 tablet 0   sucralfate (CARAFATE) 1 GM/10ML suspension Take 10 mLs (1 g total) by mouth 4 (four) times daily -  with meals and at bedtime. 3600 mL 1   venlafaxine (EFFEXOR) 37.5 MG tablet TAKE 1 TABLET ONCE DAILY.     No current facility-administered medications for this visit.     SURGICAL HISTORY:  Past Surgical History:  Procedure Laterality Date   LEG SURGERY  age 58    left leg, from fracture    REVIEW OF SYSTEMS:  Constitutional: negative Eyes: negative Ears, nose, mouth, throat, and face: negative Respiratory: positive for cough Cardiovascular: negative Gastrointestinal: negative Genitourinary:negative Integument/breast: negative Hematologic/lymphatic: negative Musculoskeletal:negative Neurological: negative Behavioral/Psych: negative Endocrine: negative Allergic/Immunologic: negative   PHYSICAL EXAMINATION: General appearance: alert, cooperative and no distress Head: Normocephalic, without obvious abnormality, atraumatic Neck: no adenopathy, no JVD, supple, symmetrical, trachea midline and thyroid not enlarged, symmetric, no tenderness/mass/nodules Lymph nodes: Cervical, supraclavicular, and axillary nodes normal. Resp: clear to auscultation bilaterally Back: symmetric, no curvature. ROM normal. No CVA tenderness. Cardio: regular rate and rhythm, S1, S2 normal, no murmur, click, rub or gallop GI: soft, non-tender; bowel sounds normal; no masses,  no organomegaly Extremities: extremities normal, atraumatic, no cyanosis or edema Neurologic: Alert and oriented X 3, normal strength and tone. Normal symmetric reflexes. Normal coordination and gait  ECOG PERFORMANCE STATUS: 1 - Symptomatic but completely ambulatory  Blood pressure (!) 112/91, pulse  94, temperature 98.9 F (37.2 C), temperature source Oral, resp. rate 18, height 5' 8" (1.727 m), weight 188 lb 12.8 oz (85.6 kg), SpO2  96 %.  LABORATORY DATA: Lab Results  Component Value Date   WBC 9.1 02/09/2019   HGB 14.2 02/09/2019   HCT 43.1 02/09/2019   MCV 89.6 02/09/2019   PLT 199 02/09/2019      Chemistry      Component Value Date/Time   NA 138 01/26/2019 1229   K 3.7 01/26/2019 1229   CL 104 01/26/2019 1229   CO2 23 01/26/2019 1229   BUN 14 01/26/2019 1229   CREATININE 0.95 01/26/2019 1229      Component Value Date/Time   CALCIUM 8.6 (L) 01/26/2019 1229   ALKPHOS 90 01/26/2019 1229   AST 28 01/26/2019 1229   ALT 55 (H) 01/26/2019 1229   BILITOT <0.2 (L) 01/26/2019 1229       RADIOGRAPHIC STUDIES: Ct Soft Tissue Neck W Contrast  Result Date: 02/06/2019 CLINICAL DATA:  Lung cancer with suspicious new lymph node on physical exam EXAM: CT NECK WITH CONTRAST TECHNIQUE: Multidetector CT imaging of the neck was performed using the standard protocol following the bolus administration of intravenous contrast. CONTRAST:  41m OMNIPAQUE IOHEXOL 300 MG/ML  SOLN COMPARISON:  Head CT 08/11/2018 FINDINGS: Pharynx and larynx: No evidence of mass or swelling. Salivary glands: No inflammation, mass, or stone. Thyroid: Normal. Lymph nodes: New cluster of mildly enlarged lymph nodes in the right posterior triangle, largest measuring 13 mm long axis. Rounded bilateral supraclavicular lymph nodes with low-density center on the left, size stable since chest CT 01/24/2019 and measuring up to 11 mm short axis on the left. Vascular: Negative. Limited intracranial: Negative. Visualized orbits: Negative. Mastoids and visualized paranasal sinuses: Clear. Skeleton: No acute or aggressive process. Upper chest: Right perihilar opacity recently evaluated by staging chest CT IMPRESSION: Cluster of mildly enlarged lymph nodes in the right posterior triangle that are new from 08/11/2018 PET-CT, likely progressive lymphatic spread from the patient's pre-existing supraclavicular adenopathy. Electronically Signed   By: JMonte FantasiaM.D.    On: 02/06/2019 16:18   Ct Chest W Contrast  Result Date: 01/24/2019 CLINICAL DATA:  Stage IIIB right upper lobe lung adenocarcinoma diagnosed January 2020 status post concurrent chemo radiation therapy with ongoing consolidation immunotherapy. EXAM: CT CHEST WITH CONTRAST TECHNIQUE: Multidetector CT imaging of the chest was performed during intravenous contrast administration. CONTRAST:  766mOMNIPAQUE IOHEXOL 300 MG/ML  SOLN COMPARISON:  11/07/2018 chest CT. FINDINGS: Cardiovascular: Normal heart size. No significant pericardial effusion/thickening. Left anterior descending coronary atherosclerosis. Minimally atherosclerotic nonaneurysmal thoracic aorta. Normal caliber pulmonary arteries. No central pulmonary emboli. Mediastinum/Nodes: No discrete thyroid nodules. Unremarkable esophagus. No axillary adenopathy. Mildly enlarged 1.1 cm left supraclavicular node (series 2/image 19), mildly decreased from 1.5 cm using similar measurement technique 11/07/2018 CT. Right supraclavicular lymph nodes have decreased in size, largest 0.7 cm, decreased from 1.1 cm. Stable mild right paratracheal adenopathy measuring up to 1.0 cm (series 2/image 60). No new pathologically enlarged mediastinal nodes. Stable mildly enlarged 1.1 cm right hilar node (series 2/image 68). No left hilar adenopathy. Lungs/Pleura: No pneumothorax. New trace dependent right pleural effusion. No left pleural effusion. Sharply marginated dense right perihilar consolidation with surrounding patchy ground-glass opacity in the perihilar mid to upper right lung, largely new, obscuring treated right upper lobe pulmonary nodule and associated with volume loss, distortion and mild bronchiectasis, compatible with evolving postradiation change. Minimal patchy paramediastinal ground-glass opacity in lower lobes bilaterally  also favored to represent evolving postradiation change. No new significant pulmonary nodules. Upper abdomen: Simple 1.1 cm upper left  renal cyst. Musculoskeletal: No aggressive appearing focal osseous lesions. Minimal thoracic spondylosis. IMPRESSION: 1. Evolving right perihilar postradiation change obscuring the treated right upper lobe pulmonary nodule. 2. Mild right hilar and right mediastinal lymphadenopathy is stable. Bilateral supraclavicular adenopathy is decreased. No new or progressive metastatic disease in the chest. Aortic Atherosclerosis (ICD10-I70.0). Electronically Signed   By: Jason A Poff M.D.   On: 01/24/2019 16:18  ° ° °ASSESSMENT AND PLAN: This is a very pleasant 57 years old white male with a stage IIIb non-small cell lung cancer, adenocarcinoma diagnosed in January 2020 with PDL 1 expression of 10% and no actionable mutations on Guardant 360 blood test. °The patient underwent a course of concurrent chemoradiation with weekly carboplatin and paclitaxel status post 6 cycles.  He tolerated this course fairly well except for fatigue and odynophagia which significantly improved. °The patient is currently undergoing consolidation treatment with Imfinzi 10 mg/KG every 2 weeks status post 6 cycles.  °He has been tolerating this treatment well with no concerning adverse effects. °He palpated some nodule on the right side of the neck and he had CT scan of the neck performed recently.  I personally and independently reviewed the scan images and discussed the result and showed the images to the patient today.  His scan showed new cluster of mildly enlarged lymph nodes in the right posterior triangle of the neck. °I recommended for the patient to see Dr. Kinard for consideration of treatment with radiotherapy to this area.  I discussed the case with Dr. Kinard and he kindly agreed to see the patient for treatment of this progressive lymph nodes in the right side of the neck. °In the meantime he will continue his current treatment with Imfinzi and he will proceed with cycle #7 today. °I will see him back for follow-up visit in 2 weeks for  evaluation before the next cycle of his treatment. °He was advised to call immediately if he has any concerning symptoms in the interval. °The patient voices understanding of current disease status and treatment options and is in agreement with the current care plan. °All questions were answered. The patient knows to call the clinic with any problems, questions or concerns. We can certainly see the patient much sooner if necessary. ° °Disclaimer: This note was dictated with voice recognition software. Similar sounding words can inadvertently be transcribed and may not be corrected upon review. ° ° °  °  °

## 2019-02-15 NOTE — Progress Notes (Signed)
Histology and Location of Primary Cancer: DIAGNOSIS:stage IIIB (T1c, N3, M0)non-small cell lung cancer, adenocarcinoma presented with right upper lobe lung mass in addition to mediastinal and bilateral supraclavicular lymphadenopathy diagnosed in January 2020. PD-L1: 10% Guardant 360 molecular studies showed no actionable mutation.  Location(s) of Symptomatic tumor(s): Per CT Soft Tissue Neck W Contrast 02/06/19:  IMPRESSION: Cluster of mildly enlarged lymph nodes in the right posterior triangle that are new from 08/11/2018 PET-CT, likely progressive lymphatic spread from the patient's pre-existing supraclavicular adenopathy  Past/Anticipated chemotherapy by medical oncology, if any: Per Dr. Julien Nordmann 02/09/19:  ASSESSMENT AND PLAN: This is a very pleasant 58 years old white male with a stage IIIb non-small cell lung cancer, adenocarcinoma diagnosed in January 2020 with PDL 1 expression of 10% and no actionable mutations on Guardant 360 blood test. The patient underwent a course of concurrent chemoradiation with weekly carboplatin and paclitaxel status post 6 cycles.  He tolerated this course fairly well except for fatigue and odynophagia which significantly improved. The patient is currently undergoing consolidation treatment with Imfinzi 10 mg/KG every 2 weeks status post 6 cycles.  He has been tolerating this treatment well with no concerning adverse effects. He palpated some nodule on the right side of the neck and he had CT scan of the neck performed recently.  I personally and independently reviewed the scan images and discussed the result and showed the images to the patient today.  His scan showed new cluster of mildly enlarged lymph nodes in the right posterior triangle of the neck. I recommended for the patient to see Dr. Sondra Come for consideration of treatment with radiotherapy to this area.  I discussed the case with Dr. Sondra Come and he kindly agreed to see the patient for treatment of this  progressive lymph nodes in the right side of the neck. In the meantime he will continue his current treatment with Imfinzi and he will proceed with cycle #7 today. I will see him back for follow-up visit in 2 weeks for evaluation before the next cycle of his treatment.  Patient's main complaints related to symptomatic tumor(s) are: Pt reports that he "just feel it" and feels neck has increased in size.  Pain on a scale of 0-10 is: pt denies c/o pain.   Ambulatory status? Walker? Wheelchair?: steady gait, without assistive device  SAFETY ISSUES: Prior radiation? Radiation treatment dates:   09/05/18-10/14/18  Site/dose:   Right lung; 30 fractions of 2 Gy for a total of 60 Gy   Beams/energy:   Photon IMRT; 6X  Pacemaker/ICD? No  Possible current pregnancy? N/A  Is the patient on methotrexate? No  Additional Complaints / other details:  Pt presents today for follow up new with Dr. Sondra Come for Radiation Oncology. Pt reports cough with occasional "mucus-y" sputum production, denies hemoptysis. Pt denies difficulty swallowing. Pt reports some difficulty with the heat/humidity and breathing.   BP (!) 125/91 (BP Location: Left Arm, Patient Position: Sitting)   Pulse 97   Temp (!) 96.8 F (36 C) (Temporal)   Resp 20   Ht _0  (1.727 m)   Wt 186 lb (84.4 kg)   SpO2 98%   BMI 28.28 kg/m   Wt Readings from Last 3 Encounters:  02/20/19 186 lb (84.4 kg)  02/09/19 188 lb 12.8 oz (85.6 kg)  01/26/19 190 lb 9.6 oz (86.5 kg)   Loma Sousa, RN BSN

## 2019-02-20 ENCOUNTER — Encounter: Payer: Self-pay | Admitting: Radiation Oncology

## 2019-02-20 ENCOUNTER — Ambulatory Visit
Admission: RE | Admit: 2019-02-20 | Discharge: 2019-02-20 | Disposition: A | Discharge status: Home / Self Care | Payer: BC Managed Care – PPO | Source: Ambulatory Visit | Attending: Radiation Oncology | Admitting: Radiation Oncology

## 2019-02-20 ENCOUNTER — Ambulatory Visit: Payer: BC Managed Care – PPO | Admitting: Radiation Oncology

## 2019-02-20 ENCOUNTER — Other Ambulatory Visit: Payer: Self-pay

## 2019-02-20 VITALS — BP 125/91 | HR 97 | Temp 96.8°F | Resp 20 | Ht 68.0 in | Wt 186.0 lb

## 2019-02-20 DIAGNOSIS — C3411 Malignant neoplasm of upper lobe, right bronchus or lung: Secondary | ICD-10-CM | POA: Insufficient documentation

## 2019-02-20 DIAGNOSIS — I700 Atherosclerosis of aorta: Secondary | ICD-10-CM | POA: Diagnosis not present

## 2019-02-20 DIAGNOSIS — Z923 Personal history of irradiation: Secondary | ICD-10-CM | POA: Diagnosis not present

## 2019-02-20 DIAGNOSIS — Z79899 Other long term (current) drug therapy: Secondary | ICD-10-CM | POA: Insufficient documentation

## 2019-02-20 DIAGNOSIS — C778 Secondary and unspecified malignant neoplasm of lymph nodes of multiple regions: Secondary | ICD-10-CM | POA: Diagnosis not present

## 2019-02-20 DIAGNOSIS — C3491 Malignant neoplasm of unspecified part of right bronchus or lung: Secondary | ICD-10-CM

## 2019-02-20 DIAGNOSIS — J90 Pleural effusion, not elsewhere classified: Secondary | ICD-10-CM | POA: Insufficient documentation

## 2019-02-20 DIAGNOSIS — Z9221 Personal history of antineoplastic chemotherapy: Secondary | ICD-10-CM | POA: Diagnosis not present

## 2019-02-20 DIAGNOSIS — R590 Localized enlarged lymph nodes: Secondary | ICD-10-CM | POA: Diagnosis not present

## 2019-02-20 DIAGNOSIS — R918 Other nonspecific abnormal finding of lung field: Secondary | ICD-10-CM | POA: Diagnosis not present

## 2019-02-20 NOTE — Progress Notes (Signed)
Radiation Oncology         [phone] ________________________________  Name: Brian [name] MRN: [id number]  Date: 02/20/2019  DOB: 1961-02-22  Reevaluation Visit Note  CC: [name], MD  [name], MD    ICD-10-CM   1. Adenocarcinoma of right lung, stage 3 (HCC)  C34.91     Diagnosis:   stage IIIB (T1c, N3, M0)non-small cell lung cancer, adenocarcinoma presented with right upper lobe lung mass in addition to mediastinal and bilateral supraclavicular lymphadenopathy diagnosed in January 2020.   Interval Since Last Radiation:  4 months  09/05/18-10/14/18  Site/dose:Right lung; 30 fractions of 2 Gy for a total of 60 Gy  Narrative:  The patient returns today for reevaluation.  he is doing well overall. He is s/ concurrent chemoradiation therapy 6 cycles. The patient is currently undergoing consolidation treatment with Imfinzi 10 mg/KG every 2 weeks status post6cycles with Dr. Julien Nordmann. He has been tolerating this treatment well with no concerning adverse effects. He palpated some nodule on the right side of the neck and he had CT scan of the neck performed recently. He is here today at the request of Dr. Julien Nordmann for radiation consideration to this neck area.   Since they were last seen in the office, they had a soft tissue neck CT scan with contrast on 02/06/19. This revealed a cluster of mildly enlarged lymph nodes in the right posterior triangle that are new from 08/11/2018 PET-CT, likely progressive lymphatic spread from the patient's pre-existing supraclavicular adenopathy   He had a chest CT performed 01/24/19 which showed evolving right perihilar postradiation change obscuring the treated right upper lobe pulmonary nodule. Mild right hilar and right mediastinal lymphadenopathy was noted to be stable. Bilateral supraclavicular adenopathy was noted to have decreased. No new or progressive disease in the chest at this time.                 On review of systems, he  reports feeling his neck has increased in size. Pt reports cough with occasional "mucus-y" sputum production, denies hemoptysis. Pt denies difficulty swallowing. Pt reports some difficulty with the heat/humidity and breathing.  he denies pain and any other symptoms. Pertinent positives are listed and detailed within the above HPI.                 ALLERGIES:  has No Known Allergies.  Meds: Current Outpatient Medications  Medication Sig Dispense Refill  . amLODipine (NORVASC) 5 MG tablet Take by mouth.    Marland Kitchen atorvastatin (LIPITOR) 20 MG tablet Take by mouth.    . metFORMIN (GLUCOPHAGE) 500 MG tablet Take by mouth.    . prochlorperazine (COMPAZINE) 10 MG tablet TAKE 1 TABLET (10 MG TOTAL) BY MOUTH EVERY 6 (SIX) HOURS AS NEEDED FOR NAUSEA OR VOMITING. 30 tablet 0  . venlafaxine (EFFEXOR) 37.5 MG tablet TAKE 1 TABLET ONCE DAILY.     No current facility-administered medications for this encounter.     Physical Findings: The patient is in no acute distress. Patient is alert and oriented.  height is 5\' 8"  (1.727 m) and weight is 186 lb (84.4 kg). His temporal temperature is 96.8 F (36 C) (abnormal). His blood pressure is 125/91 (abnormal) and his pulse is 97. His respiration is 20 and oxygen saturation is 98%. .  No significant changes. Lungs are clear to auscultation bilaterally. Heart has regular rate and rhythm. No palpable cervical, supraclavicular, or axillary adenopathy. Abdomen soft, non-tender, normal bowel sounds. Pt has a palpable  approximately 1.5 cm lymph node in the right posterior mid-neck region. Some fullness also palpated in the right posterior triangle region.   Lab Findings: Lab Results  Component Value Date   WBC 9.1 02/09/2019   HGB 14.2 02/09/2019   HCT 43.1 02/09/2019   MCV 89.6 02/09/2019   PLT 199 02/09/2019    Radiographic Findings: Ct Soft Tissue Neck W Contrast  Result Date: 02/06/2019 CLINICAL DATA:  Lung cancer with suspicious new lymph node on physical exam  EXAM: CT NECK WITH CONTRAST TECHNIQUE: Multidetector CT imaging of the neck was performed using the standard protocol following the bolus administration of intravenous contrast. CONTRAST:  70mL OMNIPAQUE IOHEXOL 300 MG/ML  SOLN COMPARISON:  Head CT 08/11/2018 FINDINGS: Pharynx and larynx: No evidence of mass or swelling. Salivary glands: No inflammation, mass, or stone. Thyroid: Normal. Lymph nodes: New cluster of mildly enlarged lymph nodes in the right posterior triangle, largest measuring 13 mm long axis. Rounded bilateral supraclavicular lymph nodes with low-density center on the left, size stable since chest CT 01/24/2019 and measuring up to 11 mm short axis on the left. Vascular: Negative. Limited intracranial: Negative. Visualized orbits: Negative. Mastoids and visualized paranasal sinuses: Clear. Skeleton: No acute or aggressive process. Upper chest: Right perihilar opacity recently evaluated by staging chest CT IMPRESSION: Cluster of mildly enlarged lymph nodes in the right posterior triangle that are new from 08/11/2018 PET-CT, likely progressive lymphatic spread from the patient's pre-existing supraclavicular adenopathy. Electronically Signed   By: Monte Fantasia M.D.   On: 02/06/2019 16:18   Ct Chest W Contrast  Result Date: 01/24/2019 CLINICAL DATA:  Stage IIIB right upper lobe lung adenocarcinoma diagnosed January 2020 status post concurrent chemo radiation therapy with ongoing consolidation immunotherapy. EXAM: CT CHEST WITH CONTRAST TECHNIQUE: Multidetector CT imaging of the chest was performed during intravenous contrast administration. CONTRAST:  40mL OMNIPAQUE IOHEXOL 300 MG/ML  SOLN COMPARISON:  11/07/2018 chest CT. FINDINGS: Cardiovascular: Normal heart size. No significant pericardial effusion/thickening. Left anterior descending coronary atherosclerosis. Minimally atherosclerotic nonaneurysmal thoracic aorta. Normal caliber pulmonary arteries. No central pulmonary emboli.  Mediastinum/Nodes: No discrete thyroid nodules. Unremarkable esophagus. No axillary adenopathy. Mildly enlarged 1.1 cm left supraclavicular node (series 2/image 19), mildly decreased from 1.5 cm using similar measurement technique 11/07/2018 CT. Right supraclavicular lymph nodes have decreased in size, largest 0.7 cm, decreased from 1.1 cm. Stable mild right paratracheal adenopathy measuring up to 1.0 cm (series 2/image 60). No new pathologically enlarged mediastinal nodes. Stable mildly enlarged 1.1 cm right hilar node (series 2/image 68). No left hilar adenopathy. Lungs/Pleura: No pneumothorax. New trace dependent right pleural effusion. No left pleural effusion. Sharply marginated dense right perihilar consolidation with surrounding patchy ground-glass opacity in the perihilar mid to upper right lung, largely new, obscuring treated right upper lobe pulmonary nodule and associated with volume loss, distortion and mild bronchiectasis, compatible with evolving postradiation change. Minimal patchy paramediastinal ground-glass opacity in lower lobes bilaterally is also favored to represent evolving postradiation change. No new significant pulmonary nodules. Upper abdomen: Simple 1.1 cm upper left renal cyst. Musculoskeletal: No aggressive appearing focal osseous lesions. Minimal thoracic spondylosis. IMPRESSION: 1. Evolving right perihilar postradiation change obscuring the treated right upper lobe pulmonary nodule. 2. Mild right hilar and right mediastinal lymphadenopathy is stable. Bilateral supraclavicular adenopathy is decreased. No new or progressive metastatic disease in the chest. Aortic Atherosclerosis (ICD10-I70.0). Electronically Signed   By: Ilona Sorrel M.D.   On: 01/24/2019 16:18    Impression:   stage IIIB (T1c, N3, M0)non-small  cell lung cancer, adenocarcinoma presented with right upper lobe lung mass in addition to mediastinal and bilateral supraclavicular lymphadenopathy diagnosed in January 2020.     Pt has developed conglomerate of small lymph nodes in the right posterior triangle region. This appears to be his only site of involvment and therefore would recommend an aggressive course of radiation therapy. This area of recurrence is very close to his previous radiation fields and total dose to be given to this area would depend on results of overlap issues with his previous radiation treatment area.   We discussed course of treatment, side effects and potential toxicities of radiation to this area. Patient wishes to proceed with radiation therapy as part of his overall management.    Plan:  Pt will return for CT simulation on July 21st at 2:30. He will have IV contrast to help delineate the lymph node areas more clearly. I am recommending IMRT to limit potential overlap to previous radiation therapy.   ____________________________________   Blair Promise, PhD, MD    This document serves as a record of services personally performed by Gery Pray, MD. It was created on his behalf by Mary-Margaret Loma Messing, a trained medical scribe. The creation of this record is based on the scribe's personal observations and the provider's statements to them. This document has been checked and approved by the attending provider.

## 2019-02-20 NOTE — Patient Instructions (Signed)
Coronavirus (COVID-19) Are you at risk?  Are you at risk for the Coronavirus (COVID-19)?  To be considered HIGH RISK for Coronavirus (COVID-19), you have to meet the following criteria:  . Traveled to [place], [place], [place], [place] or [place]; or in the [place] to [place], [place], [place], or [place]; and have fever, cough, and shortness of breath within the last 2 weeks of travel OR . Been in close contact with a person diagnosed with COVID-19 within the last 2 weeks and have fever, cough, and shortness of breath . IF YOU DO NOT MEET THESE CRITERIA, YOU ARE CONSIDERED LOW RISK FOR COVID-19.  What to do if you are HIGH RISK for COVID-19?  . If you are having a medical emergency, call 911. . Seek medical care right away. Before you go to a doctor's office, urgent care or emergency department, call ahead and tell them about your recent travel, contact with someone diagnosed with COVID-19, and your symptoms. You should receive instructions from your physician's office regarding next steps of care.  . When you arrive at healthcare provider, tell the healthcare staff immediately you have returned from visiting [place], [place], [place], [place] or [place]; or traveled in the [place] to [place], [place], [place], or [place]; in the last two weeks or you have been in close contact with a person diagnosed with COVID-19 in the last 2 weeks.   . Tell the health care staff about your symptoms: fever, cough and shortness of breath. . After you have been seen by a medical provider, you will be either: o Tested for (COVID-19) and discharged home on quarantine except to seek medical care if symptoms worsen, and asked to  - Stay home and avoid contact with others until you get your results (4-5 days)  - Avoid travel on public transportation if possible (such as bus, train, or airplane) or o Sent to the Emergency Department by EMS for evaluation, COVID-19 testing, and possible  admission depending on your condition and test results.  What to do if you are LOW RISK for COVID-19?  Reduce your risk of any infection by using the same precautions used for avoiding the common cold or flu:  . Wash your hands often with soap and warm water for at least 20 seconds.  If soap and water are not readily available, use an alcohol-based hand sanitizer with at least 60% alcohol.  . If coughing or sneezing, cover your mouth and nose by coughing or sneezing into the elbow areas of your shirt or coat, into a tissue or into your sleeve (not your hands). . Avoid shaking hands with others and consider head nods or verbal greetings only. . Avoid touching your eyes, nose, or mouth with unwashed hands.  . Avoid close contact with people who are sick. . Avoid places or events with large numbers of people in one location, like concerts or sporting events. . Carefully consider travel plans you have or are making. . If you are planning any travel outside or inside the [place], visit the CDC's Travelers' Health webpage for the latest health notices. . If you have some symptoms but not all symptoms, continue to monitor at home and seek medical attention if your symptoms worsen. . If you are having a medical emergency, call 911.   ADDITIONAL HEALTHCARE OPTIONS FOR PATIENTS  [facility] / e-Visit: [email or web address]         [facility]: [phone]  [facility]: [phone]                   [facility]: [phone]   

## 2019-02-21 ENCOUNTER — Ambulatory Visit
Admission: RE | Admit: 2019-02-21 | Discharge: 2019-02-21 | Disposition: A | Discharge status: Home / Self Care | Payer: BC Managed Care – PPO | Source: Ambulatory Visit | Attending: Radiation Oncology | Admitting: Radiation Oncology

## 2019-02-21 ENCOUNTER — Other Ambulatory Visit: Payer: Self-pay

## 2019-02-21 DIAGNOSIS — Z510 Encounter for antineoplastic radiation therapy: Secondary | ICD-10-CM | POA: Insufficient documentation

## 2019-02-21 DIAGNOSIS — C778 Secondary and unspecified malignant neoplasm of lymph nodes of multiple regions: Secondary | ICD-10-CM | POA: Insufficient documentation

## 2019-02-21 DIAGNOSIS — C3411 Malignant neoplasm of upper lobe, right bronchus or lung: Secondary | ICD-10-CM | POA: Diagnosis not present

## 2019-02-21 NOTE — Progress Notes (Addendum)
Has armband been applied?  Yes.    Does patient have an allergy to IV contrast dye?: No.   Has patient ever received premedication for IV contrast dye?: No.   Does patient take metformin?: Yes.    If patient does take metformin when was the last dose: 0800 02/21/2019  Date of lab work: 02/09/2019 BUN: 18 CR: 0.80  IV site: antecubital right, condition patent and no redness  Has IV site been added to flowsheet?  Yes.    There were no vitals taken for this visit.   IV removed, catheter tip intact after CT Simulation. Pt tolerated well. Loma Sousa, RN BSN

## 2019-02-23 ENCOUNTER — Inpatient Hospital Stay: Payer: BC Managed Care – PPO

## 2019-02-23 ENCOUNTER — Telehealth: Payer: Self-pay

## 2019-02-23 ENCOUNTER — Inpatient Hospital Stay (HOSPITAL_BASED_OUTPATIENT_CLINIC_OR_DEPARTMENT_OTHER): Payer: BC Managed Care – PPO | Admitting: Physician Assistant

## 2019-02-23 ENCOUNTER — Other Ambulatory Visit: Payer: Self-pay

## 2019-02-23 VITALS — BP 125/86 | HR 109 | Temp 97.7°F | Resp 18 | Ht 68.0 in | Wt 187.8 lb

## 2019-02-23 VITALS — HR 98

## 2019-02-23 DIAGNOSIS — Z5112 Encounter for antineoplastic immunotherapy: Secondary | ICD-10-CM | POA: Diagnosis not present

## 2019-02-23 DIAGNOSIS — Z79899 Other long term (current) drug therapy: Secondary | ICD-10-CM | POA: Diagnosis not present

## 2019-02-23 DIAGNOSIS — C3411 Malignant neoplasm of upper lobe, right bronchus or lung: Secondary | ICD-10-CM | POA: Diagnosis not present

## 2019-02-23 DIAGNOSIS — J90 Pleural effusion, not elsewhere classified: Secondary | ICD-10-CM | POA: Diagnosis not present

## 2019-02-23 DIAGNOSIS — I10 Essential (primary) hypertension: Secondary | ICD-10-CM | POA: Diagnosis not present

## 2019-02-23 DIAGNOSIS — R1310 Dysphagia, unspecified: Secondary | ICD-10-CM

## 2019-02-23 DIAGNOSIS — C3491 Malignant neoplasm of unspecified part of right bronchus or lung: Secondary | ICD-10-CM

## 2019-02-23 DIAGNOSIS — Z510 Encounter for antineoplastic radiation therapy: Secondary | ICD-10-CM | POA: Diagnosis not present

## 2019-02-23 DIAGNOSIS — R590 Localized enlarged lymph nodes: Secondary | ICD-10-CM

## 2019-02-23 DIAGNOSIS — J479 Bronchiectasis, uncomplicated: Secondary | ICD-10-CM | POA: Diagnosis not present

## 2019-02-23 DIAGNOSIS — Z7984 Long term (current) use of oral hypoglycemic drugs: Secondary | ICD-10-CM | POA: Diagnosis not present

## 2019-02-23 DIAGNOSIS — E119 Type 2 diabetes mellitus without complications: Secondary | ICD-10-CM | POA: Diagnosis not present

## 2019-02-23 DIAGNOSIS — N281 Cyst of kidney, acquired: Secondary | ICD-10-CM | POA: Diagnosis not present

## 2019-02-23 DIAGNOSIS — R599 Enlarged lymph nodes, unspecified: Secondary | ICD-10-CM | POA: Diagnosis not present

## 2019-02-23 DIAGNOSIS — C778 Secondary and unspecified malignant neoplasm of lymph nodes of multiple regions: Secondary | ICD-10-CM | POA: Diagnosis not present

## 2019-02-23 LAB — CBC WITH DIFFERENTIAL (CANCER CENTER ONLY)
Abs Immature Granulocytes: 0.03 10*3/uL (ref 0.00–0.07)
Basophils Absolute: 0 10*3/uL (ref 0.0–0.1)
Basophils Relative: 0 %
Eosinophils Absolute: 0.3 10*3/uL (ref 0.0–0.5)
Eosinophils Relative: 3 %
HCT: 43.4 % (ref 39.0–52.0)
Hemoglobin: 14.6 g/dL (ref 13.0–17.0)
Immature Granulocytes: 0 %
Lymphocytes Relative: 7 %
Lymphs Abs: 0.6 10*3/uL — ABNORMAL LOW (ref 0.7–4.0)
MCH: 29.3 pg (ref 26.0–34.0)
MCHC: 33.6 g/dL (ref 30.0–36.0)
MCV: 87.1 fL (ref 80.0–100.0)
Monocytes Absolute: 1.1 10*3/uL — ABNORMAL HIGH (ref 0.1–1.0)
Monocytes Relative: 12 %
Neutro Abs: 7.1 10*3/uL (ref 1.7–7.7)
Neutrophils Relative %: 78 %
Platelet Count: 201 10*3/uL (ref 150–400)
RBC: 4.98 MIL/uL (ref 4.22–5.81)
RDW: 12.6 % (ref 11.5–15.5)
WBC Count: 9.2 10*3/uL (ref 4.0–10.5)
nRBC: 0 % (ref 0.0–0.2)

## 2019-02-23 LAB — CMP (CANCER CENTER ONLY)
ALT: 44 U/L (ref 0–44)
AST: 23 U/L (ref 15–41)
Albumin: 3.7 g/dL (ref 3.5–5.0)
Alkaline Phosphatase: 95 U/L (ref 38–126)
Anion gap: 9 (ref 5–15)
BUN: 12 mg/dL (ref 6–20)
CO2: 23 mmol/L (ref 22–32)
Calcium: 9.1 mg/dL (ref 8.9–10.3)
Chloride: 108 mmol/L (ref 98–111)
Creatinine: 0.82 mg/dL (ref 0.61–1.24)
GFR, Est AFR Am: >60 mL/min (ref >60)
GFR, Estimated: >60 mL/min (ref >60)
Glucose, Bld: 146 mg/dL — ABNORMAL HIGH (ref 70–99)
Potassium: 4.3 mmol/L (ref 3.5–5.1)
Sodium: 140 mmol/L (ref 135–145)
Total Bilirubin: 0.2 mg/dL — ABNORMAL LOW (ref 0.3–1.2)
Total Protein: 6.7 g/dL (ref 6.5–8.1)

## 2019-02-23 MED ORDER — SODIUM CHLORIDE 0.9 % IV SOLN
10.0000 mg/kg | Freq: Once | INTRAVENOUS | Status: AC
  Administered 2019-02-23: 860 mg via INTRAVENOUS
  Filled 2019-02-23: qty 10

## 2019-02-23 MED ORDER — SODIUM CHLORIDE 0.9 % IV SOLN
Freq: Once | INTRAVENOUS | Status: AC
  Administered 2019-02-23: 10:00:00 via INTRAVENOUS
  Filled 2019-02-23: qty 250

## 2019-02-23 NOTE — Patient Instructions (Signed)
[facility] Discharge Instructions for Patients Receiving Chemotherapy  Today you received the following chemotherapy agents: Imfinzi.  To help prevent nausea and vomiting after your treatment, we encourage you to take your nausea medication as directed.   If you develop nausea and vomiting that is not controlled by your nausea medication, call the clinic.   BELOW ARE SYMPTOMS THAT SHOULD BE REPORTED IMMEDIATELY:  *FEVER GREATER THAN 100.5 F  *CHILLS WITH OR WITHOUT FEVER  NAUSEA AND VOMITING THAT IS NOT CONTROLLED WITH YOUR NAUSEA MEDICATION  *UNUSUAL SHORTNESS OF BREATH  *UNUSUAL BRUISING OR BLEEDING  TENDERNESS IN MOUTH AND THROAT WITH OR WITHOUT PRESENCE OF ULCERS  *URINARY PROBLEMS  *BOWEL PROBLEMS  UNUSUAL RASH Items with * indicate a potential emergency and should be followed up as soon as possible.  Feel free to call the clinic should you have any questions or concerns. The clinic phone number is [phone].  Please show the CHEMO ALERT CARD at check-in to the Emergency Department and triage nurse.   

## 2019-02-23 NOTE — Telephone Encounter (Signed)
Left VM for pt that he is to resume Metformin following today's labs. This RN's direct number left for further questions/concerns. Loma Sousa, RN BSN

## 2019-02-23 NOTE — Progress Notes (Signed)
[place] OFFICE PROGRESS NOTE  [name], MD [address] [place] [address]  DIAGNOSIS: Stage IIIB (T1c, N3, M0)non-small cell lung cancer, adenocarcinoma presented with right upper lobe lung mass in addition to mediastinal and bilateral supraclavicular lymphadenopathy diagnosed in January 2020. PD-L1: 10% [employer] molecular studies showed no actionable mutation.  PRIOR THERAPY: Concurrent chemoradiation with weekly carboplatin for AUC of 2 and paclitaxel 45 mg/M2.Status post 6 cycles.  Last dose was given on October 10, 2018 with stable disease.  CURRENT THERAPY: Consolidation treatment with immunotherapy with Imfinzi 10 mg/KG every 2 weeks.  First dose November 17, 2018.  Status post 7 cycles.  INTERVAL HISTORY: Brian [name] 58 y.o. male returns to the clinic for a follow-up visit.  The patient is feeling well today without any concerning complaints except for one episode of new onset of visual symptoms. The patient states approximately 2 days ago, he was looking at a computer screen at work when he developed "fluttering" in his peripheral vision bilaterally. He states that his symptoms occurred for approximately 10 minutes before it resolved. He denies any associated symptoms including headache, numbness/tingling/weakness, gait disturbances, nausea, vomiting, photophobia, visual field defects, or diplopia. He has never experienced these symptoms before. His symptoms have not reoccurred since that time.  He is planning on starting radiation treatment to the enlarging right cervical lymph nodes next week. The patient continues to tolerate his consolidation immunotherapy with Imfinzi well without any adverse side effects.  He denies any fever, chills, night sweats, or weight loss.  He occasionally experiences pleuritic chest pain in the right side of his chest in the location of his tumor. He denies shortness of breath or cough. He had one episode while  brushing his teeth a few days ago in which he spit up about a 1/2 tea spoon of red blood mixed with phlegm. He is unsure where the blood originated from whether from his gums or from his throat. He denies any recurrent symptoms. Denies any nose bleeds or other known sites of bleeding. He denies any nausea, vomiting, diarrhea, or constipation. He denies any rashes or skin changes.  He is here today for evaluation before starting cycle #8.  MEDICAL HISTORY: Past Medical History:  Diagnosis Date  . Asthma   . Cancer ([facility])   . Diabetes mellitus without complication ([facility])   . Hypertension   . Subarachnoid hemorrhage ([facility])     ALLERGIES:  has No Known Allergies.  MEDICATIONS:  Current Outpatient Medications  Medication Sig Dispense Refill  . amLODipine (NORVASC) 5 MG tablet Take by mouth.    Marland Kitchen atorvastatin (LIPITOR) 20 MG tablet Take by mouth.    . metFORMIN (GLUCOPHAGE) 500 MG tablet Take by mouth.    . prochlorperazine (COMPAZINE) 10 MG tablet TAKE 1 TABLET (10 MG TOTAL) BY MOUTH EVERY 6 (SIX) HOURS AS NEEDED FOR NAUSEA OR VOMITING. 30 tablet 0  . venlafaxine (EFFEXOR) 37.5 MG tablet TAKE 1 TABLET ONCE DAILY.     No current facility-administered medications for this visit.    Facility-Administered Medications Ordered in Other Visits  Medication Dose Route Frequency Provider Last Rate Last Dose  . durvalumab (IMFINZI) 860 mg in sodium chloride 0.9 % 100 mL chemo infusion  10 mg/kg (Treatment Plan Recorded) Intravenous Once Curt Bears, MD        SURGICAL HISTORY:  Past Surgical History:  Procedure Laterality Date  . LEG SURGERY  age 31    left leg, from  fracture    REVIEW OF SYSTEMS:   Review of Systems  Constitutional: Negative for appetite change, chills, fatigue, fever and unexpected weight change.  HENT: Negative for mouth sores, nosebleeds, sore throat and trouble swallowing.   Eyes: Positive for one episode of peripheral field "fluttering". Negative for icterus,  diplopia, photophobia, or blurry vision.  Respiratory: Negative for cough, shortness of breath and wheezing.   Cardiovascular: Positive for right sided pleuritic chest pain. Negative for leg swelling.  Gastrointestinal: Negative for abdominal pain, constipation, diarrhea, nausea and vomiting.  Genitourinary: Negative for bladder incontinence, difficulty urinating, dysuria, frequency and hematuria.   Musculoskeletal: Negative for back pain, gait problem, neck pain and neck stiffness.  Skin: Negative for itching and rash.  Neurological: Negative for dizziness, extremity weakness, gait problem, headaches, light-headedness and seizures.  Hematological: Positive for right cervical lymphadenopathy. Does not bruise/bleed easily.  Psychiatric/Behavioral: Negative for confusion, depression and sleep disturbance. The patient is not nervous/anxious.     PHYSICAL EXAMINATION:  Blood pressure 125/86, pulse (!) 109, temperature 97.7 F (36.5 C), temperature source Temporal, resp. rate 18, height '5\' 8"'  (1.727 m), weight 187 lb 12.8 oz (85.2 kg), SpO2 97 %.  ECOG PERFORMANCE STATUS: 1 - Symptomatic but completely ambulatory  Physical Exam  Constitutional: Oriented to person, place, and time and well-developed, well-nourished, and in no distress.  HENT:  Head: Normocephalic and atraumatic.  Mouth/Throat: Oropharynx is clear and moist. No oropharyngeal exudate.  Eyes: Conjunctivae are normal. Right eye exhibits no discharge. Left eye exhibits no discharge. No scleral icterus.  Neck: Normal range of motion. Neck supple.  Cardiovascular: Normal rate, regular rhythm, normal heart sounds and intact distal pulses.   Pulmonary/Chest: Effort normal and breath sounds normal. No respiratory distress. No wheezes. No rales.  Abdominal: Soft. Bowel sounds are normal. Exhibits no distension and no mass. There is no tenderness.  Musculoskeletal: Normal range of motion. Exhibits no edema.  Lymphadenopathy:    Positive  right sided cervical lymphadenopathy.  Neurological: Alert and oriented to person, place, and time. Exhibits normal muscle tone. Gait normal. Coordination normal. Cranial nerves II-XII grossly intact. Normal strength.  Skin: Skin is warm and dry. No rash noted. Not diaphoretic. No erythema. No pallor.  Psychiatric: Mood, memory and judgment normal.  Vitals reviewed.  LABORATORY DATA: Lab Results  Component Value Date   WBC 9.2 02/23/2019   HGB 14.6 02/23/2019   HCT 43.4 02/23/2019   MCV 87.1 02/23/2019   PLT 201 02/23/2019      Chemistry      Component Value Date/Time   NA 140 02/23/2019 0910   K 4.3 02/23/2019 0910   CL 108 02/23/2019 0910   CO2 23 02/23/2019 0910   BUN 12 02/23/2019 0910   CREATININE 0.82 02/23/2019 0910      Component Value Date/Time   CALCIUM 9.1 02/23/2019 0910   ALKPHOS 95 02/23/2019 0910   AST 23 02/23/2019 0910   ALT 44 02/23/2019 0910   BILITOT 0.2 (L) 02/23/2019 0910       RADIOGRAPHIC STUDIES:  Ct Soft Tissue Neck W Contrast  Result Date: 02/06/2019 CLINICAL DATA:  Lung cancer with suspicious new lymph node on physical exam EXAM: CT NECK WITH CONTRAST TECHNIQUE: Multidetector CT imaging of the neck was performed using the standard protocol following the bolus administration of intravenous contrast. CONTRAST:  62m OMNIPAQUE IOHEXOL 300 MG/ML  SOLN COMPARISON:  Head CT 08/11/2018 FINDINGS: Pharynx and larynx: No evidence of mass or swelling. Salivary glands: No inflammation, mass, or  stone. Thyroid: Normal. Lymph nodes: New cluster of mildly enlarged lymph nodes in the right posterior triangle, largest measuring 13 mm long axis. Rounded bilateral supraclavicular lymph nodes with low-density center on the left, size stable since chest CT 01/24/2019 and measuring up to 11 mm short axis on the left. Vascular: Negative. Limited intracranial: Negative. Visualized orbits: Negative. Mastoids and visualized paranasal sinuses: Clear. Skeleton: No acute or  aggressive process. Upper chest: Right perihilar opacity recently evaluated by staging chest CT IMPRESSION: Cluster of mildly enlarged lymph nodes in the right posterior triangle that are new from 08/11/2018 PET-CT, likely progressive lymphatic spread from the patient's pre-existing supraclavicular adenopathy. Electronically Signed   By: Monte Fantasia M.D.   On: 02/06/2019 16:18   Ct Chest W Contrast  Result Date: 01/24/2019 CLINICAL DATA:  Stage IIIB right upper lobe lung adenocarcinoma diagnosed January 2020 status post concurrent chemo radiation therapy with ongoing consolidation immunotherapy. EXAM: CT CHEST WITH CONTRAST TECHNIQUE: Multidetector CT imaging of the chest was performed during intravenous contrast administration. CONTRAST:  29m OMNIPAQUE IOHEXOL 300 MG/ML  SOLN COMPARISON:  11/07/2018 chest CT. FINDINGS: Cardiovascular: Normal heart size. No significant pericardial effusion/thickening. Left anterior descending coronary atherosclerosis. Minimally atherosclerotic nonaneurysmal thoracic aorta. Normal caliber pulmonary arteries. No central pulmonary emboli. Mediastinum/Nodes: No discrete thyroid nodules. Unremarkable esophagus. No axillary adenopathy. Mildly enlarged 1.1 cm left supraclavicular node (series 2/image 19), mildly decreased from 1.5 cm using similar measurement technique 11/07/2018 CT. Right supraclavicular lymph nodes have decreased in size, largest 0.7 cm, decreased from 1.1 cm. Stable mild right paratracheal adenopathy measuring up to 1.0 cm (series 2/image 60). No new pathologically enlarged mediastinal nodes. Stable mildly enlarged 1.1 cm right hilar node (series 2/image 68). No left hilar adenopathy. Lungs/Pleura: No pneumothorax. New trace dependent right pleural effusion. No left pleural effusion. Sharply marginated dense right perihilar consolidation with surrounding patchy ground-glass opacity in the perihilar mid to upper right lung, largely new, obscuring treated right  upper lobe pulmonary nodule and associated with volume loss, distortion and mild bronchiectasis, compatible with evolving postradiation change. Minimal patchy paramediastinal ground-glass opacity in lower lobes bilaterally is also favored to represent evolving postradiation change. No new significant pulmonary nodules. Upper abdomen: Simple 1.1 cm upper left renal cyst. Musculoskeletal: No aggressive appearing focal osseous lesions. Minimal thoracic spondylosis. IMPRESSION: 1. Evolving right perihilar postradiation change obscuring the treated right upper lobe pulmonary nodule. 2. Mild right hilar and right mediastinal lymphadenopathy is stable. Bilateral supraclavicular adenopathy is decreased. No new or progressive metastatic disease in the chest. Aortic Atherosclerosis (ICD10-I70.0). Electronically Signed   By: JIlona SorrelM.D.   On: 01/24/2019 16:18     ASSESSMENT/PLAN:  This is a very pleasant 58year old Caucasian male diagnosed with stage IIIb non-small cell lung cancer, adenocarcinoma.He presented with a right upper lobe lung mass in addition to mediastinal and bilateral supraclavicular lymphadenopathy. He was diagnosed in January 2020. His PDL 1 expression is 10% and he has no actionable mutations.  He underwent a course of concurrent chemoradiation with weekly carboplatin and paclitaxel. He is status post 6 cycles. He tolerated well except for fatigue and odynophagia.  He is currently undergoing consolidation immunotherapy with Imfinzi 10 mg/kg IV every 2 weeks. He is status post7cycles of treatment. He tolerated it well without any adverse side effects. He is about to start radiation treatment to the enlarging right cervical lymph nodes next week.  Labs were reviewed. I recommend that he proceed with cycle #8 today as scheduled.   We will see  him back for a follow up visit in 2 weeks for evaluation before starting cycle #9.   Regarding the visual symptoms, the patient was advised  to contact our office if he develops new or recurrent visual symptoms. The patient was also advised to contact our office if he develops any episodes of hemoptysis.   The patient was advised to call immediately if he has any concerning symptoms in the interval. The patient voices understanding of current disease status and treatment options and is in agreement with the current care plan. All questions were answered. The patient knows to call the clinic with any problems, questions or concerns. We can certainly see the patient much sooner if necessary   No orders of the defined types were placed in this encounter.    Latrese Carolan L Gleb Mcguire, PA-C 02/23/19

## 2019-03-01 ENCOUNTER — Telehealth: Payer: Self-pay | Admitting: Radiation Oncology

## 2019-03-02 ENCOUNTER — Ambulatory Visit
Admission: RE | Admit: 2019-03-02 | Discharge: 2019-03-02 | Disposition: A | Discharge status: Home / Self Care | Payer: BC Managed Care – PPO | Source: Ambulatory Visit | Attending: Radiation Oncology | Admitting: Radiation Oncology

## 2019-03-02 ENCOUNTER — Other Ambulatory Visit: Payer: Self-pay

## 2019-03-02 DIAGNOSIS — C778 Secondary and unspecified malignant neoplasm of lymph nodes of multiple regions: Secondary | ICD-10-CM | POA: Diagnosis not present

## 2019-03-02 DIAGNOSIS — C3491 Malignant neoplasm of unspecified part of right bronchus or lung: Secondary | ICD-10-CM

## 2019-03-02 DIAGNOSIS — Z510 Encounter for antineoplastic radiation therapy: Secondary | ICD-10-CM | POA: Diagnosis not present

## 2019-03-02 DIAGNOSIS — C3411 Malignant neoplasm of upper lobe, right bronchus or lung: Secondary | ICD-10-CM | POA: Diagnosis not present

## 2019-03-02 NOTE — Progress Notes (Signed)
  Radiation Oncology         [phone] ________________________________  Name: Brian [name] MRN: [id number]  Date: 03/02/2019  DOB: 12/30/1960  Simulation Verification Note    ICD-10-CM   1. Adenocarcinoma of right lung, stage 3 ([facility])  C34.91     Status: outpatient  NARRATIVE: The patient was brought to the treatment unit and placed in the planned treatment position. The clinical setup was verified. Then port films were obtained and uploaded to the radiation oncology medical record software.  The treatment beams were carefully compared against the planned radiation fields. The position location and shape of the radiation fields was reviewed. They targeted volume of tissue appears to be appropriately covered by the radiation beams. Organs at risk appear to be excluded as planned.  Based on my personal review, I approved the simulation verification. The patient's treatment will proceed as planned.  -----------------------------------  Blair Promise, PhD, MD  This document serves as a record of services personally performed by Gery Pray, MD. It was created on his behalf by Rae Lips, a trained medical scribe. The creation of this record is based on the scribe's personal observations and the provider's statements to them. This document has been checked and approved by the attending provider.

## 2019-03-03 ENCOUNTER — Ambulatory Visit
Admission: RE | Admit: 2019-03-03 | Discharge: 2019-03-03 | Disposition: A | Discharge status: Home / Self Care | Payer: BC Managed Care – PPO | Source: Ambulatory Visit | Attending: Radiation Oncology | Admitting: Radiation Oncology

## 2019-03-03 ENCOUNTER — Other Ambulatory Visit: Payer: Self-pay

## 2019-03-03 DIAGNOSIS — C3411 Malignant neoplasm of upper lobe, right bronchus or lung: Secondary | ICD-10-CM | POA: Diagnosis not present

## 2019-03-03 DIAGNOSIS — Z510 Encounter for antineoplastic radiation therapy: Secondary | ICD-10-CM | POA: Diagnosis not present

## 2019-03-03 DIAGNOSIS — C778 Secondary and unspecified malignant neoplasm of lymph nodes of multiple regions: Secondary | ICD-10-CM | POA: Diagnosis not present

## 2019-03-06 ENCOUNTER — Other Ambulatory Visit: Payer: Self-pay

## 2019-03-06 ENCOUNTER — Ambulatory Visit
Admission: RE | Admit: 2019-03-06 | Discharge: 2019-03-06 | Disposition: A | Discharge status: Home / Self Care | Payer: BC Managed Care – PPO | Source: Ambulatory Visit | Attending: Radiation Oncology | Admitting: Radiation Oncology

## 2019-03-06 DIAGNOSIS — Z510 Encounter for antineoplastic radiation therapy: Secondary | ICD-10-CM | POA: Diagnosis not present

## 2019-03-06 DIAGNOSIS — C778 Secondary and unspecified malignant neoplasm of lymph nodes of multiple regions: Secondary | ICD-10-CM | POA: Insufficient documentation

## 2019-03-06 DIAGNOSIS — C3411 Malignant neoplasm of upper lobe, right bronchus or lung: Secondary | ICD-10-CM | POA: Diagnosis not present

## 2019-03-06 NOTE — Progress Notes (Signed)
@Patient  ID: Brian [name], male    DOB: [birth year], 58 y.o.   MRN: [id number]  Chief Complaint  Patient presents with  . Follow-up    Patient reports since his pneumonia a month ago he has occational sob with exertion and cough.     Referring provider: [name], MD  HPI:  58 year old male former smoker followed in our office for chronic cough and abnormal CT chest  PMH: Adenocarcinoma right lung stage III Smoker/ Smoking History: Former smoker Maintenance:   Pt of: Dr. Elsworth Soho  03/07/2019  - Visit   58 year old male former smoker followed in our office for abnormal CT.  After last office visit patient was diagnosed with metastatic adenocarcinoma.  He is followed by Dr. Earlie Server for this.  He just recently started 6 weeks of daily radiation he is 4 days done.  Patient also reports that he was recently treated for pneumonia by primary care provider.  Patient reports he was treated with azithromycin about 4 weeks ago.  Clinically patient feels that he is doing better.  He has not had a follow-up chest x-ray by primary care yet.  Patient made the appointment today to see if he needs to have follow-up testing such as pulmonary function testing.  He is under the impression that he was supposed to have this completed 6 months from his January/2020 office visit.   Tests:   CT chest without contrast 07/25/18  which showed mild changes of emphysema, 2.8 cm solid nodule with spiculated margins extending to the pleura of the right upper lobe and 10.5 mm precarinal lymph node   PET 01/9484 hypermetabolic right upper lobe nodule with hypermetabolism in mediastinal, subcarinal and bilateral supraclavicular lymph nodes  PFTs 08/2018 nml  08/23/2018-surgical path-core biopsy-metastatic adenocarcinoma  11/07/2018-CT chest with contrast-centrilobular emphysema, stable size of right upper lobe pulmonary nodule measuring up to 2.3 cm in diameter, no substantial change in bilateral supraclavicular  mediastinal and right hilar lymphadenopathy seen to be hypermetabolic on previous PET imaging, aortic arthrosclerosis, emphysema  01/24/2019-CT chest with contrast- evolving right perihilar post radiation change obscuring the treated right upper lobe pulmonary nodule, mild right hilar and right mediastinal lymphadenopathy is stable.  Bilateral supraclavicular adenopathy has decreased no new or progressive metastatic disease in chest, mild bronchiectasis  FENO:  No results found for: NITRICOXIDE  PFT: PFT Results Latest Ref Rng & Units 08/18/2018  FVC-Pre L 3.91  FVC-Predicted Pre % 82  FVC-Post L 4.06  FVC-Predicted Post % 85  Pre FEV1/FVC % % 79  Post FEV1/FCV % % 84  FEV1-Pre L 3.09  FEV1-Predicted Pre % 84  FEV1-Post L 3.39  DLCO UNC% % 85  DLCO COR %Predicted % 99  TLC L 5.72  TLC % Predicted % 84  RV % Predicted % 78    Imaging: Ct Soft Tissue Neck W Contrast  Result Date: 02/06/2019 CLINICAL DATA:  Lung cancer with suspicious new lymph node on physical exam EXAM: CT NECK WITH CONTRAST TECHNIQUE: Multidetector CT imaging of the neck was performed using the standard protocol following the bolus administration of intravenous contrast. CONTRAST:  77mL OMNIPAQUE IOHEXOL 300 MG/ML  SOLN COMPARISON:  Head CT 08/11/2018 FINDINGS: Pharynx and larynx: No evidence of mass or swelling. Salivary glands: No inflammation, mass, or stone. Thyroid: Normal. Lymph nodes: New cluster of mildly enlarged lymph nodes in the right posterior triangle, largest measuring 13 mm long axis. Rounded bilateral supraclavicular lymph nodes with low-density center on the left, size stable since chest CT  01/24/2019 and measuring up to 11 mm short axis on the left. Vascular: Negative. Limited intracranial: Negative. Visualized orbits: Negative. Mastoids and visualized paranasal sinuses: Clear. Skeleton: No acute or aggressive process. Upper chest: Right perihilar opacity recently evaluated by staging chest CT IMPRESSION:  Cluster of mildly enlarged lymph nodes in the right posterior triangle that are new from 08/11/2018 PET-CT, likely progressive lymphatic spread from the patient's pre-existing supraclavicular adenopathy. Electronically Signed   By: Monte Fantasia M.D.   On: 02/06/2019 16:18      Specialty Problems      Pulmonary Problems   Mass of upper lobe of right lung   Adenocarcinoma of right lung, stage 3 (HCC)   Centrilobular emphysema ([place])    11/07/2018-CT chest with contrast-centrilobular emphysema, stable size of right upper lobe pulmonary nodule measuring up to 2.3 cm in diameter, no substantial change in bilateral supraclavicular mediastinal and right hilar lymphadenopathy seen to be hypermetabolic on previous PET imaging, aortic arthroscleros      Lobar pneumonia, unspecified organism ([place])      No Known Allergies  Immunization History  Administered Date(s) Administered  . Influenza,inj,Quad PF,6+ Mos 06/14/2018    Past Medical History:  Diagnosis Date  . Asthma   . Cancer ([place])   . Diabetes mellitus without complication ([place])   . Hypertension   . Subarachnoid hemorrhage (HCC)     Tobacco History: Social History   Tobacco Use  Smoking Status Never Smoker  Smokeless Tobacco Never Used   Counseling given: Not Answered   Continue to not smoke  Outpatient Encounter Medications as of 03/07/2019  Medication Sig  . amLODipine (NORVASC) 5 MG tablet Take by mouth.  Marland Kitchen atorvastatin (LIPITOR) 20 MG tablet Take by mouth.  . metFORMIN (GLUCOPHAGE) 500 MG tablet Take by mouth.  . prochlorperazine (COMPAZINE) 10 MG tablet TAKE 1 TABLET (10 MG TOTAL) BY MOUTH EVERY 6 (SIX) HOURS AS NEEDED FOR NAUSEA OR VOMITING.  . venlafaxine (EFFEXOR) 37.5 MG tablet TAKE 1 TABLET ONCE DAILY.  Marland Kitchen Respiratory Therapy Supplies (FLUTTER) DEVI Please use up 10 times daily(4-5 breaths, 4-5 times daily)   Facility-Administered Encounter Medications as of 03/07/2019  Medication  . Sonafine emulsion 1  application     Review of Systems  Review of Systems  Constitutional: Positive for fatigue. Negative for activity change, chills, fever and unexpected weight change.  HENT: Negative for postnasal drip, rhinorrhea, sinus pressure, sinus pain, sore throat and trouble swallowing (No current issues with swallowing, patient admits that typically when he is halfway through radiation he starts to struggle with swallowing).   Eyes: Negative.   Respiratory: Negative for cough, shortness of breath and wheezing.   Cardiovascular: Negative for chest pain and palpitations.  Gastrointestinal: Negative for diarrhea, nausea and vomiting.  Endocrine: Negative.   Genitourinary: Negative.   Musculoskeletal: Negative.   Skin: Negative.   Neurological: Negative for dizziness and headaches.  Psychiatric/Behavioral: Negative.  Negative for dysphoric mood. The patient is not nervous/anxious.   All other systems reviewed and are negative.    Physical Exam  BP 120/76 (BP Location: Left Arm, Cuff Size: Normal)   Pulse 90   Temp 98.2 F (36.8 C) (Oral)   Ht 5\' 8"  (1.727 m)   Wt 189 lb 3.2 oz (85.8 kg)   SpO2 97%   BMI 28.77 kg/m   Wt Readings from Last 5 Encounters:  03/07/19 189 lb 3.2 oz (85.8 kg)  02/23/19 187 lb 12.8 oz (85.2 kg)  02/20/19 186 lb (84.4 kg)  02/09/19 188 lb 12.8 oz (85.6 kg)  01/26/19 190 lb 9.6 oz (86.5 kg)     Physical Exam Vitals signs and nursing note reviewed.  Constitutional:      General: He is not in acute distress.    Appearance: Normal appearance. He is obese.  HENT:     Head: Normocephalic and atraumatic.     Right Ear: Hearing, tympanic membrane, ear canal and external ear normal.     Left Ear: Hearing, tympanic membrane, ear canal and external ear normal.     Nose: Nose normal. No mucosal edema, congestion or rhinorrhea.     Right Turbinates: Not enlarged.     Left Turbinates: Not enlarged.     Mouth/Throat:     Mouth: Mucous membranes are dry.      Pharynx: Oropharynx is clear. No oropharyngeal exudate.  Eyes:     Pupils: Pupils are equal, round, and reactive to light.  Neck:     Musculoskeletal: Normal range of motion.  Cardiovascular:     Rate and Rhythm: Normal rate and regular rhythm.     Pulses: Normal pulses.     Heart sounds: Normal heart sounds. No murmur.  Pulmonary:     Effort: Pulmonary effort is normal.     Breath sounds: Normal breath sounds. No decreased breath sounds, wheezing or rales.  Abdominal:     General: Abdomen is flat. Bowel sounds are normal. There is no distension.     Palpations: Abdomen is soft.     Tenderness: There is no abdominal tenderness.  Musculoskeletal:     Right lower leg: No edema.     Left lower leg: No edema.  Lymphadenopathy:     Cervical: No cervical adenopathy.  Skin:    General: Skin is warm and dry.     Capillary Refill: Capillary refill takes less than 2 seconds.     Findings: No erythema or rash.  Neurological:     General: No focal deficit present.     Mental Status: He is alert and oriented to person, place, and time.     Motor: No weakness.     Coordination: Coordination normal.     Gait: Gait is intact. Gait normal.  Psychiatric:        Mood and Affect: Mood normal.        Behavior: Behavior normal. Behavior is cooperative.        Thought Content: Thought content normal.        Judgment: Judgment normal.      Lab Results:  CBC    Component Value Date/Time   WBC 9.2 02/23/2019 0910   WBC 11.0 (H) 08/23/2018 0601   RBC 4.98 02/23/2019 0910   HGB 14.6 02/23/2019 0910   HCT 43.4 02/23/2019 0910   PLT 201 02/23/2019 0910   MCV 87.1 02/23/2019 0910   MCH 29.3 02/23/2019 0910   MCHC 33.6 02/23/2019 0910   RDW 12.6 02/23/2019 0910   LYMPHSABS 0.6 (L) 02/23/2019 0910   MONOABS 1.1 (H) 02/23/2019 0910   EOSABS 0.3 02/23/2019 0910   BASOSABS 0.0 02/23/2019 0910    BMET    Component Value Date/Time   NA 140 02/23/2019 0910   K 4.3 02/23/2019 0910   CL 108  02/23/2019 0910   CO2 23 02/23/2019 0910   GLUCOSE 146 (H) 02/23/2019 0910   BUN 12 02/23/2019 0910   CREATININE 0.82 02/23/2019 0910   CALCIUM 9.1 02/23/2019 0910   GFRNONAA >60 02/23/2019 0910   GFRAA >  60 02/23/2019 0910    BNP No results found for: BNP  ProBNP No results found for: PROBNP    Assessment & Plan:   Lobar pneumonia, unspecified organism [facility]) Patient reports he was treated with azithromycin 4 weeks ago by primary care after chest x-ray revealed pneumonia Patient reports he is clinically doing better Suspect may have been actually a bronchiectatic flare  Plan: Flutter valve started today Continue Mucinex Patient to follow-up with primary care to receive a repeat chest x-ray in 6 to 8 weeks to ensure infiltrate has resolved  Centrilobular emphysema ([place]) April/2020 CT chest shows centrilobular emphysema Former smoker Stable and normal pulmonary function test in January/2020 Reviewed pulmonary function testing from January/2020 with patient today  Plan: Tentatively can plan to have a follow-up with our office in 6 months with pulmonary function testing to further evaluate after patient completes his radiation    Return in about 6 months (around 09/07/2019), or if symptoms worsen or fail to improve, for Follow up for PFT, Follow up with Dr. Elsworth Soho.   Lauraine Rinne, NP 03/07/2019   This appointment was 26 minutes long with over 50% of the time in direct face-to-face patient care, assessment, plan of care, and follow-up.

## 2019-03-07 ENCOUNTER — Encounter: Payer: Self-pay | Admitting: Pulmonary Disease

## 2019-03-07 ENCOUNTER — Ambulatory Visit
Admission: RE | Admit: 2019-03-07 | Discharge: 2019-03-07 | Disposition: A | Discharge status: Home / Self Care | Payer: BC Managed Care – PPO | Source: Ambulatory Visit | Attending: Radiation Oncology | Admitting: Radiation Oncology

## 2019-03-07 ENCOUNTER — Other Ambulatory Visit: Payer: Self-pay

## 2019-03-07 ENCOUNTER — Ambulatory Visit (INDEPENDENT_AMBULATORY_CARE_PROVIDER_SITE_OTHER): Payer: BC Managed Care – PPO | Admitting: Pulmonary Disease

## 2019-03-07 VITALS — BP 120/76 | HR 90 | Temp 98.2°F | Ht 68.0 in | Wt 189.2 lb

## 2019-03-07 DIAGNOSIS — C3411 Malignant neoplasm of upper lobe, right bronchus or lung: Secondary | ICD-10-CM | POA: Diagnosis not present

## 2019-03-07 DIAGNOSIS — J432 Centrilobular emphysema: Secondary | ICD-10-CM | POA: Diagnosis not present

## 2019-03-07 DIAGNOSIS — J181 Lobar pneumonia, unspecified organism: Secondary | ICD-10-CM | POA: Diagnosis not present

## 2019-03-07 DIAGNOSIS — C778 Secondary and unspecified malignant neoplasm of lymph nodes of multiple regions: Secondary | ICD-10-CM | POA: Diagnosis not present

## 2019-03-07 DIAGNOSIS — C3491 Malignant neoplasm of unspecified part of right bronchus or lung: Secondary | ICD-10-CM

## 2019-03-07 DIAGNOSIS — Z510 Encounter for antineoplastic radiation therapy: Secondary | ICD-10-CM | POA: Diagnosis not present

## 2019-03-07 MED ORDER — FLUTTER DEVI: 0 refills | Status: DC

## 2019-03-07 MED ORDER — SONAFINE EX EMUL
1.0000 "application " | Freq: Two times a day (BID) | CUTANEOUS | Status: DC
  Administered 2019-03-07: 1 via TOPICAL

## 2019-03-07 NOTE — Patient Instructions (Addendum)
  Use a flutter valve 10 breaths twice a day or 4 to 5 breaths 4-5 times a day to help clear mucus out  Continue Mucinex Continue to hydrate well   Continue to follow-up with oncology  Follow-up with primary care in 6 to 8 weeks for repeat chest x-ray to follow your pneumonia and they treated you for  Return in about 6 months (around 09/07/2019), or if symptoms worsen or fail to improve, for Follow up for PFT, Follow up with Dr. Elsworth Soho.

## 2019-03-07 NOTE — Assessment & Plan Note (Signed)
April/2020 CT chest shows centrilobular emphysema Former smoker Stable and normal pulmonary function test in January/2020 Reviewed pulmonary function testing from January/2020 with patient today  Plan: Tentatively can plan to have a follow-up with our office in 6 months with pulmonary function testing to further evaluate after patient completes his radiation

## 2019-03-07 NOTE — Assessment & Plan Note (Signed)
Patient reports he was treated with azithromycin 4 weeks ago by primary care after chest x-ray revealed pneumonia Patient reports he is clinically doing better Suspect may have been actually a bronchiectatic flare  Plan: Flutter valve started today Continue Mucinex Patient to follow-up with primary care to receive a repeat chest x-ray in 6 to 8 weeks to ensure infiltrate has resolved

## 2019-03-08 ENCOUNTER — Ambulatory Visit
Admission: RE | Admit: 2019-03-08 | Discharge: 2019-03-08 | Disposition: A | Discharge status: Home / Self Care | Payer: BC Managed Care – PPO | Source: Ambulatory Visit | Attending: Radiation Oncology | Admitting: Radiation Oncology

## 2019-03-08 ENCOUNTER — Other Ambulatory Visit: Payer: Self-pay

## 2019-03-08 DIAGNOSIS — C778 Secondary and unspecified malignant neoplasm of lymph nodes of multiple regions: Secondary | ICD-10-CM | POA: Diagnosis not present

## 2019-03-08 DIAGNOSIS — Z510 Encounter for antineoplastic radiation therapy: Secondary | ICD-10-CM | POA: Diagnosis not present

## 2019-03-08 DIAGNOSIS — C3411 Malignant neoplasm of upper lobe, right bronchus or lung: Secondary | ICD-10-CM | POA: Diagnosis not present

## 2019-03-09 ENCOUNTER — Ambulatory Visit
Admission: RE | Admit: 2019-03-09 | Discharge: 2019-03-09 | Disposition: A | Discharge status: Home / Self Care | Payer: BC Managed Care – PPO | Source: Ambulatory Visit | Attending: Radiation Oncology | Admitting: Radiation Oncology

## 2019-03-09 ENCOUNTER — Other Ambulatory Visit: Payer: Self-pay

## 2019-03-09 ENCOUNTER — Inpatient Hospital Stay: Payer: BC Managed Care – PPO | Attending: Internal Medicine

## 2019-03-09 ENCOUNTER — Inpatient Hospital Stay: Payer: BC Managed Care – PPO

## 2019-03-09 ENCOUNTER — Encounter: Payer: Self-pay | Admitting: Physician Assistant

## 2019-03-09 ENCOUNTER — Inpatient Hospital Stay (HOSPITAL_BASED_OUTPATIENT_CLINIC_OR_DEPARTMENT_OTHER): Payer: BC Managed Care – PPO | Admitting: Physician Assistant

## 2019-03-09 VITALS — HR 91

## 2019-03-09 VITALS — BP 118/81 | HR 105 | Temp 98.0°F | Resp 20 | Ht 68.0 in | Wt 188.3 lb

## 2019-03-09 DIAGNOSIS — E119 Type 2 diabetes mellitus without complications: Secondary | ICD-10-CM | POA: Insufficient documentation

## 2019-03-09 DIAGNOSIS — C3491 Malignant neoplasm of unspecified part of right bronchus or lung: Secondary | ICD-10-CM

## 2019-03-09 DIAGNOSIS — Z923 Personal history of irradiation: Secondary | ICD-10-CM | POA: Insufficient documentation

## 2019-03-09 DIAGNOSIS — Z7984 Long term (current) use of oral hypoglycemic drugs: Secondary | ICD-10-CM | POA: Insufficient documentation

## 2019-03-09 DIAGNOSIS — Z510 Encounter for antineoplastic radiation therapy: Secondary | ICD-10-CM | POA: Diagnosis not present

## 2019-03-09 DIAGNOSIS — J45909 Unspecified asthma, uncomplicated: Secondary | ICD-10-CM | POA: Diagnosis not present

## 2019-03-09 DIAGNOSIS — C778 Secondary and unspecified malignant neoplasm of lymph nodes of multiple regions: Secondary | ICD-10-CM | POA: Diagnosis not present

## 2019-03-09 DIAGNOSIS — Z5112 Encounter for antineoplastic immunotherapy: Secondary | ICD-10-CM | POA: Insufficient documentation

## 2019-03-09 DIAGNOSIS — C3411 Malignant neoplasm of upper lobe, right bronchus or lung: Secondary | ICD-10-CM | POA: Diagnosis not present

## 2019-03-09 DIAGNOSIS — I10 Essential (primary) hypertension: Secondary | ICD-10-CM | POA: Diagnosis not present

## 2019-03-09 DIAGNOSIS — Z79899 Other long term (current) drug therapy: Secondary | ICD-10-CM | POA: Diagnosis not present

## 2019-03-09 LAB — CBC WITH DIFFERENTIAL (CANCER CENTER ONLY)
Abs Immature Granulocytes: 0.04 10*3/uL (ref 0.00–0.07)
Basophils Absolute: 0.1 10*3/uL (ref 0.0–0.1)
Basophils Relative: 1 %
Eosinophils Absolute: 0.3 10*3/uL (ref 0.0–0.5)
Eosinophils Relative: 3 %
HCT: 41.7 % (ref 39.0–52.0)
Hemoglobin: 14.1 g/dL (ref 13.0–17.0)
Immature Granulocytes: 0 %
Lymphocytes Relative: 8 %
Lymphs Abs: 0.8 10*3/uL (ref 0.7–4.0)
MCH: 29.1 pg (ref 26.0–34.0)
MCHC: 33.8 g/dL (ref 30.0–36.0)
MCV: 86.2 fL (ref 80.0–100.0)
Monocytes Absolute: 1.3 10*3/uL — ABNORMAL HIGH (ref 0.1–1.0)
Monocytes Relative: 14 %
Neutro Abs: 7.1 10*3/uL (ref 1.7–7.7)
Neutrophils Relative %: 74 %
Platelet Count: 198 10*3/uL (ref 150–400)
RBC: 4.84 MIL/uL (ref 4.22–5.81)
RDW: 13.1 % (ref 11.5–15.5)
WBC Count: 9.6 10*3/uL (ref 4.0–10.5)
nRBC: 0 % (ref 0.0–0.2)

## 2019-03-09 LAB — CMP (CANCER CENTER ONLY)
ALT: 49 U/L — ABNORMAL HIGH (ref 0–44)
AST: 26 U/L (ref 15–41)
Albumin: 3.9 g/dL (ref 3.5–5.0)
Alkaline Phosphatase: 81 U/L (ref 38–126)
Anion gap: 12 (ref 5–15)
BUN: 13 mg/dL (ref 6–20)
CO2: 20 mmol/L — ABNORMAL LOW (ref 22–32)
Calcium: 9.3 mg/dL (ref 8.9–10.3)
Chloride: 108 mmol/L (ref 98–111)
Creatinine: 0.81 mg/dL (ref 0.61–1.24)
GFR, Est AFR Am: >60 mL/min (ref >60)
GFR, Estimated: >60 mL/min (ref >60)
Glucose, Bld: 105 mg/dL — ABNORMAL HIGH (ref 70–99)
Potassium: 4.1 mmol/L (ref 3.5–5.1)
Sodium: 140 mmol/L (ref 135–145)
Total Bilirubin: 0.2 mg/dL — ABNORMAL LOW (ref 0.3–1.2)
Total Protein: 6.7 g/dL (ref 6.5–8.1)

## 2019-03-09 LAB — TSH: TSH: 0.244 u[IU]/mL — ABNORMAL LOW (ref 0.320–4.118)

## 2019-03-09 MED ORDER — SODIUM CHLORIDE 0.9 % IV SOLN
Freq: Once | INTRAVENOUS | Status: AC
  Administered 2019-03-09: 14:00:00 via INTRAVENOUS
  Filled 2019-03-09: qty 250

## 2019-03-09 MED ORDER — SODIUM CHLORIDE 0.9 % IV SOLN
10.0000 mg/kg | Freq: Once | INTRAVENOUS | Status: AC
  Administered 2019-03-09: 860 mg via INTRAVENOUS
  Filled 2019-03-09: qty 7.2

## 2019-03-09 NOTE — Progress Notes (Signed)
[facility] OFFICE PROGRESS NOTE  Brian Broker, MD [address] [place] [address]  DIAGNOSIS: Stage IIIB (T1c, N3, M0)non-small cell lung cancer, adenocarcinoma presented with right upper lobe lung mass in addition to mediastinal and bilateral supraclavicular lymphadenopathy diagnosed in January 2020. PD-L1: 10% [employer] molecular studiesshowed no actionable mutation.  PRIOR THERAPY: Concurrent chemoradiation with weekly carboplatin for AUC of 2 and paclitaxel 45 mg/M2.Status post 6 cycles. Last dose was given on October 10, 2018 with stable disease.  CURRENT THERAPY: 1) Consolidation treatment with immunotherapy with Imfinzi 10 mg/KG every 2 weeks. First dose November 17, 2018. Status post 8cycles. 2) Radiation treatment to the enlarging right cervical lymph nodes under the care of Dr. Sondra Come. First treatment 03/06/2019  INTERVAL HISTORY: Brian [name] 58 y.o. male returns to the clinic for a follow up visit. The patient is feeling well today without any concerning complaints. He recently saw his pulmonologist for a follow up visit after his bout of pneumonia a few weeks ago. His pulmonologist recently started him on a flutter valve and he is taking mucinex. He is planning on having a repeat chest x-ray in 6-8 weeks. He denies any recurrent pneumonia symptoms at this time. He denies fevers, chills, night sweats, or weight loss. He denies any chest pain, shortness of breath, cough, or hemoptysis.   The patient also recently started radiation therapy to the enlarging right cervical lymph nodes. He just started this week and is planning on receiving a total of 30 treatments over the next 6 weeks under the care of Dr. Sondra Come.   The patient otherwise is tolerating his treatment with immunotherapy well without any adverse effects. He denies any nausea, vomiting, diarrhea, or constipation. He denies any headaches or visual changes. He denies any rashes or skin  changes except for some erythema over his right cervical area due to radiation. He also is requesting recommendations for therapists in the area to help him cope with the stress of his recent cancer diagnosis. He is here today for evaluation prior to starting cycle #9.    MEDICAL HISTORY: Past Medical History:  Diagnosis Date  . Asthma   . Cancer ([place])   . Diabetes mellitus without complication ([place])   . Hypertension   . Subarachnoid hemorrhage (HCC)     ALLERGIES:  has No Known Allergies.  MEDICATIONS:  Current Outpatient Medications  Medication Sig Dispense Refill  . amLODipine (NORVASC) 5 MG tablet Take by mouth.    Marland Kitchen atorvastatin (LIPITOR) 20 MG tablet Take by mouth.    . metFORMIN (GLUCOPHAGE) 500 MG tablet Take by mouth.    . prochlorperazine (COMPAZINE) 10 MG tablet TAKE 1 TABLET (10 MG TOTAL) BY MOUTH EVERY 6 (SIX) HOURS AS NEEDED FOR NAUSEA OR VOMITING. 30 tablet 0  . Respiratory Therapy Supplies (FLUTTER) DEVI Please use up 10 times daily(4-5 breaths, 4-5 times daily) 1 each 0  . venlafaxine (EFFEXOR) 37.5 MG tablet TAKE 1 TABLET ONCE DAILY.     No current facility-administered medications for this visit.    Facility-Administered Medications Ordered in Other Visits  Medication Dose Route Frequency Provider Last Rate Last Dose  . durvalumab (IMFINZI) 860 mg in sodium chloride 0.9 % 100 mL chemo infusion  10 mg/kg (Treatment Plan Recorded) Intravenous Once Curt Bears, MD 117 mL/hr at 03/09/19 1444 860 mg at 03/09/19 1444    SURGICAL HISTORY:  Past Surgical History:  Procedure Laterality Date  . LEG SURGERY  age 30  left leg, from fracture    REVIEW OF SYSTEMS:   Review of Systems  Constitutional: Negative for appetite change, chills, fatigue, fever and unexpected weight change.  HENT:   Negative for mouth sores, nosebleeds, sore throat and trouble swallowing.   Eyes: Negative for eye problems and icterus.  Respiratory: Negative for cough, hemoptysis,  shortness of breath and wheezing.   Cardiovascular: Negative for chest pain and leg swelling.  Gastrointestinal: Negative for abdominal pain, constipation, diarrhea, nausea and vomiting.  Genitourinary: Negative for bladder incontinence, difficulty urinating, dysuria, frequency and hematuria.   Musculoskeletal: Negative for back pain, gait problem, neck pain and neck stiffness.  Skin: Negative for itching and rash.  Neurological: Negative for dizziness, extremity weakness, gait problem, headaches, light-headedness and seizures.  Hematological: Positive for right cervical lymphadenopathy. Does not bruise/bleed easily.  Psychiatric/Behavioral: Negative for confusion, depression and sleep disturbance. The patient is not nervous/anxious.     PHYSICAL EXAMINATION:  Blood pressure 118/81, pulse (!) 105, temperature 98 F (36.7 C), temperature source Oral, resp. rate 20, height '5\' 8"'  (1.727 m), weight 188 lb 4.8 oz (85.4 kg), SpO2 98 %.  ECOG PERFORMANCE STATUS: 1 - Symptomatic but completely ambulatory  Physical Exam  Constitutional: Oriented to person, place, and time and well-developed, well-nourished, and in no distress.  HENT:  Head: Normocephalic and atraumatic.  Mouth/Throat: Oropharynx is clear and moist. No oropharyngeal exudate.  Eyes: Conjunctivae are normal. Right eye exhibits no discharge. Left eye exhibits no discharge. No scleral icterus.  Neck: Normal range of motion. Neck supple.  Cardiovascular: Normal rate, regular rhythm, normal heart sounds and intact distal pulses.   Pulmonary/Chest: Effort normal and breath sounds normal. No respiratory distress. No wheezes. No rales.  Abdominal: Soft. Bowel sounds are normal. Exhibits no distension and no mass. There is no tenderness.  Musculoskeletal: Normal range of motion. Exhibits no edema.  Lymphadenopathy:    Right cervical lymphadenopathy  Neurological: Alert and oriented to person, place, and time. Exhibits normal muscle tone.  Gait normal. Coordination normal.  Skin: Mild erythema over the right cervical region secondary to radiation treatment. Skin is warm and dry. No rash noted. Not diaphoretic. No pallor.  Psychiatric: Mood, memory and judgment normal.  Vitals reviewed.  LABORATORY DATA: Lab Results  Component Value Date   WBC 9.6 03/09/2019   HGB 14.1 03/09/2019   HCT 41.7 03/09/2019   MCV 86.2 03/09/2019   PLT 198 03/09/2019      Chemistry      Component Value Date/Time   NA 140 03/09/2019 1247   K 4.1 03/09/2019 1247   CL 108 03/09/2019 1247   CO2 20 (L) 03/09/2019 1247   BUN 13 03/09/2019 1247   CREATININE 0.81 03/09/2019 1247      Component Value Date/Time   CALCIUM 9.3 03/09/2019 1247   ALKPHOS 81 03/09/2019 1247   AST 26 03/09/2019 1247   ALT 49 (H) 03/09/2019 1247   BILITOT 0.2 (L) 03/09/2019 1247       RADIOGRAPHIC STUDIES:  No results found.   ASSESSMENT/PLAN:  This is a very pleasant 58 year old Caucasian male diagnosed with stage IIIb non-small cell lung cancer, adenocarcinoma.He presented with a right upper lobe lung mass in addition to mediastinal and bilateral supraclavicular lymphadenopathy. He was diagnosed in January 2020. His PDL 1 expression is 10% and he has no actionable mutations.  He underwent a course of concurrent chemoradiation with weekly carboplatin and paclitaxel. He is status post 6 cycles. He tolerated well except for fatigue and  odynophagia.   He is currently undergoing consolidation immunotherapy with Imfinzi 10 mg/kg IV every 2 weeks. He is status post8cycles of treatment. He tolerated it well without any adverse side effects.He is also undergoing radiation to the enlarging right cervical lymph nodes. He will receive 30 treatments.  The patient was seen with Dr. Julien Nordmann today. Labs were reviewed today. We recommend that he proceed with cycle #9 today as scheduled.   We will see him back for a follow up visit in 3 weeks for evaluation before  starting cycle #10  I have sent a referral for a psychologist at the patient's request for therapy/counseling for his recent stress/cancer diagnosis.   The patient was advised to call immediately if he has any concerning symptoms in the interval. The patient voices understanding of current disease status and treatment options and is in agreement with the current care plan. All questions were answered. The patient knows to call the clinic with any problems, questions or concerns. We can certainly see the patient much sooner if necessary  Orders Placed This Encounter  Procedures  . Ambulatory referral to Psychology    Referral Priority:   Routine    Referral Type:   Psychiatric    Referral Reason:   Specialty Services Required    Requested Specialty:   Psychology    Number of Visits Requested:   [place], PA-C 03/09/19  ADDENDUM: Hematology/Oncology Attending: I had a face-to-face encounter with the patient today.  I recommended his care plan.  This is a very pleasant 58 years old white male with stage IIIb non-small cell lung cancer status post a course of concurrent chemoradiation with weekly carboplatin and paclitaxel with stable disease.  He is currently undergoing treatment with consolidation immunotherapy with Imfinzi 10 mg/KG every 2 weeks status post 8 cycles.  He has been tolerating his treatment with immunotherapy fairly well.  He was recently found to have right cervical lymphadenopathy and the patient is currently undergoing palliative radiotherapy to this area. He is feeling fine today with no concerning complaints except for fatigue.  He also interested in psychological counseling because of his current condition. We will arrange for the patient to have evaluation by psychology. He will proceed with his treatment today as planned. He will come back for follow-up visit in 2 weeks for evaluation before the next cycle of his treatment. He was advised to  call immediately if he has any concerning symptoms in the interval.  Disclaimer: This note was dictated with voice recognition software. Similar sounding words can inadvertently be transcribed and may be missed upon review. Eilleen Kempf, MD 03/09/19

## 2019-03-09 NOTE — Patient Instructions (Signed)
[facility] Discharge Instructions for Patients Receiving Chemotherapy  Today you received the following chemotherapy agents: Imfinzi.  To help prevent nausea and vomiting after your treatment, we encourage you to take your nausea medication as directed.   If you develop nausea and vomiting that is not controlled by your nausea medication, call the clinic.   BELOW ARE SYMPTOMS THAT SHOULD BE REPORTED IMMEDIATELY:  *FEVER GREATER THAN 100.5 F  *CHILLS WITH OR WITHOUT FEVER  NAUSEA AND VOMITING THAT IS NOT CONTROLLED WITH YOUR NAUSEA MEDICATION  *UNUSUAL SHORTNESS OF BREATH  *UNUSUAL BRUISING OR BLEEDING  TENDERNESS IN MOUTH AND THROAT WITH OR WITHOUT PRESENCE OF ULCERS  *URINARY PROBLEMS  *BOWEL PROBLEMS  UNUSUAL RASH Items with * indicate a potential emergency and should be followed up as soon as possible.  Feel free to call the clinic should you have any questions or concerns. The clinic phone number is [phone].  Please show the CHEMO ALERT CARD at check-in to the Emergency Department and triage nurse.   

## 2019-03-10 ENCOUNTER — Ambulatory Visit
Admission: RE | Admit: 2019-03-10 | Discharge: 2019-03-10 | Disposition: A | Discharge status: Home / Self Care | Payer: BC Managed Care – PPO | Source: Ambulatory Visit | Attending: Radiation Oncology | Admitting: Radiation Oncology

## 2019-03-10 ENCOUNTER — Other Ambulatory Visit: Payer: Self-pay

## 2019-03-10 DIAGNOSIS — Z510 Encounter for antineoplastic radiation therapy: Secondary | ICD-10-CM | POA: Diagnosis not present

## 2019-03-10 DIAGNOSIS — C778 Secondary and unspecified malignant neoplasm of lymph nodes of multiple regions: Secondary | ICD-10-CM | POA: Diagnosis not present

## 2019-03-10 DIAGNOSIS — C3411 Malignant neoplasm of upper lobe, right bronchus or lung: Secondary | ICD-10-CM | POA: Diagnosis not present

## 2019-03-13 ENCOUNTER — Other Ambulatory Visit: Payer: Self-pay

## 2019-03-13 ENCOUNTER — Ambulatory Visit
Admission: RE | Admit: 2019-03-13 | Discharge: 2019-03-13 | Disposition: A | Discharge status: Home / Self Care | Payer: BC Managed Care – PPO | Source: Ambulatory Visit | Attending: Radiation Oncology | Admitting: Radiation Oncology

## 2019-03-13 DIAGNOSIS — Z510 Encounter for antineoplastic radiation therapy: Secondary | ICD-10-CM | POA: Diagnosis not present

## 2019-03-13 DIAGNOSIS — C778 Secondary and unspecified malignant neoplasm of lymph nodes of multiple regions: Secondary | ICD-10-CM | POA: Diagnosis not present

## 2019-03-13 DIAGNOSIS — C3411 Malignant neoplasm of upper lobe, right bronchus or lung: Secondary | ICD-10-CM | POA: Diagnosis not present

## 2019-03-14 ENCOUNTER — Other Ambulatory Visit: Payer: Self-pay

## 2019-03-14 ENCOUNTER — Ambulatory Visit
Admission: RE | Admit: 2019-03-14 | Discharge: 2019-03-14 | Disposition: A | Discharge status: Home / Self Care | Payer: BC Managed Care – PPO | Source: Ambulatory Visit | Attending: Radiation Oncology | Admitting: Radiation Oncology

## 2019-03-14 DIAGNOSIS — C3411 Malignant neoplasm of upper lobe, right bronchus or lung: Secondary | ICD-10-CM | POA: Diagnosis not present

## 2019-03-14 DIAGNOSIS — C778 Secondary and unspecified malignant neoplasm of lymph nodes of multiple regions: Secondary | ICD-10-CM | POA: Diagnosis not present

## 2019-03-14 DIAGNOSIS — Z510 Encounter for antineoplastic radiation therapy: Secondary | ICD-10-CM | POA: Diagnosis not present

## 2019-03-15 ENCOUNTER — Other Ambulatory Visit: Payer: Self-pay

## 2019-03-15 ENCOUNTER — Ambulatory Visit
Admission: RE | Admit: 2019-03-15 | Discharge: 2019-03-15 | Disposition: A | Discharge status: Home / Self Care | Payer: BC Managed Care – PPO | Source: Ambulatory Visit | Attending: Radiation Oncology | Admitting: Radiation Oncology

## 2019-03-15 DIAGNOSIS — C778 Secondary and unspecified malignant neoplasm of lymph nodes of multiple regions: Secondary | ICD-10-CM | POA: Diagnosis not present

## 2019-03-15 DIAGNOSIS — C3411 Malignant neoplasm of upper lobe, right bronchus or lung: Secondary | ICD-10-CM | POA: Diagnosis not present

## 2019-03-15 DIAGNOSIS — Z510 Encounter for antineoplastic radiation therapy: Secondary | ICD-10-CM | POA: Diagnosis not present

## 2019-03-16 ENCOUNTER — Other Ambulatory Visit: Payer: Self-pay

## 2019-03-16 ENCOUNTER — Ambulatory Visit
Admission: RE | Admit: 2019-03-16 | Discharge: 2019-03-16 | Disposition: A | Discharge status: Home / Self Care | Payer: BC Managed Care – PPO | Source: Ambulatory Visit | Attending: Radiation Oncology | Admitting: Radiation Oncology

## 2019-03-16 DIAGNOSIS — Z510 Encounter for antineoplastic radiation therapy: Secondary | ICD-10-CM | POA: Diagnosis not present

## 2019-03-16 DIAGNOSIS — C778 Secondary and unspecified malignant neoplasm of lymph nodes of multiple regions: Secondary | ICD-10-CM | POA: Diagnosis not present

## 2019-03-16 DIAGNOSIS — C3411 Malignant neoplasm of upper lobe, right bronchus or lung: Secondary | ICD-10-CM | POA: Diagnosis not present

## 2019-03-17 ENCOUNTER — Other Ambulatory Visit: Payer: Self-pay

## 2019-03-17 ENCOUNTER — Ambulatory Visit
Admission: RE | Admit: 2019-03-17 | Discharge: 2019-03-17 | Disposition: A | Discharge status: Home / Self Care | Payer: BC Managed Care – PPO | Source: Ambulatory Visit | Attending: Radiation Oncology | Admitting: Radiation Oncology

## 2019-03-17 DIAGNOSIS — C3411 Malignant neoplasm of upper lobe, right bronchus or lung: Secondary | ICD-10-CM | POA: Diagnosis not present

## 2019-03-17 DIAGNOSIS — C778 Secondary and unspecified malignant neoplasm of lymph nodes of multiple regions: Secondary | ICD-10-CM | POA: Diagnosis not present

## 2019-03-17 DIAGNOSIS — Z510 Encounter for antineoplastic radiation therapy: Secondary | ICD-10-CM | POA: Diagnosis not present

## 2019-03-19 ENCOUNTER — Other Ambulatory Visit: Payer: Self-pay | Admitting: Internal Medicine

## 2019-03-19 DIAGNOSIS — C3491 Malignant neoplasm of unspecified part of right bronchus or lung: Secondary | ICD-10-CM

## 2019-03-20 ENCOUNTER — Ambulatory Visit
Admission: RE | Admit: 2019-03-20 | Discharge: 2019-03-20 | Disposition: A | Discharge status: Home / Self Care | Payer: BC Managed Care – PPO | Source: Ambulatory Visit | Attending: Radiation Oncology | Admitting: Radiation Oncology

## 2019-03-20 ENCOUNTER — Other Ambulatory Visit: Payer: Self-pay

## 2019-03-20 DIAGNOSIS — Z510 Encounter for antineoplastic radiation therapy: Secondary | ICD-10-CM | POA: Diagnosis not present

## 2019-03-20 DIAGNOSIS — C3411 Malignant neoplasm of upper lobe, right bronchus or lung: Secondary | ICD-10-CM | POA: Diagnosis not present

## 2019-03-20 DIAGNOSIS — C778 Secondary and unspecified malignant neoplasm of lymph nodes of multiple regions: Secondary | ICD-10-CM | POA: Diagnosis not present

## 2019-03-21 ENCOUNTER — Other Ambulatory Visit: Payer: Self-pay

## 2019-03-21 ENCOUNTER — Other Ambulatory Visit: Payer: Self-pay | Admitting: Radiation Oncology

## 2019-03-21 ENCOUNTER — Ambulatory Visit
Admission: RE | Admit: 2019-03-21 | Discharge: 2019-03-21 | Disposition: A | Discharge status: Home / Self Care | Payer: BC Managed Care – PPO | Source: Ambulatory Visit | Attending: Radiation Oncology | Admitting: Radiation Oncology

## 2019-03-21 DIAGNOSIS — C778 Secondary and unspecified malignant neoplasm of lymph nodes of multiple regions: Secondary | ICD-10-CM | POA: Diagnosis not present

## 2019-03-21 DIAGNOSIS — C3491 Malignant neoplasm of unspecified part of right bronchus or lung: Secondary | ICD-10-CM

## 2019-03-21 DIAGNOSIS — C3411 Malignant neoplasm of upper lobe, right bronchus or lung: Secondary | ICD-10-CM | POA: Diagnosis not present

## 2019-03-21 DIAGNOSIS — Z510 Encounter for antineoplastic radiation therapy: Secondary | ICD-10-CM | POA: Diagnosis not present

## 2019-03-21 MED ORDER — PROCHLORPERAZINE MALEATE 10 MG PO TABS: 10.0000 mg | ORAL_TABLET | Freq: Four times a day (QID) | ORAL | 0 refills | Status: DC | PRN

## 2019-03-22 ENCOUNTER — Other Ambulatory Visit: Payer: Self-pay

## 2019-03-22 ENCOUNTER — Ambulatory Visit
Admission: RE | Admit: 2019-03-22 | Discharge: 2019-03-22 | Disposition: A | Discharge status: Home / Self Care | Payer: BC Managed Care – PPO | Source: Ambulatory Visit | Attending: Radiation Oncology | Admitting: Radiation Oncology

## 2019-03-22 DIAGNOSIS — C3411 Malignant neoplasm of upper lobe, right bronchus or lung: Secondary | ICD-10-CM | POA: Diagnosis not present

## 2019-03-22 DIAGNOSIS — C778 Secondary and unspecified malignant neoplasm of lymph nodes of multiple regions: Secondary | ICD-10-CM | POA: Diagnosis not present

## 2019-03-22 DIAGNOSIS — Z510 Encounter for antineoplastic radiation therapy: Secondary | ICD-10-CM | POA: Diagnosis not present

## 2019-03-23 ENCOUNTER — Inpatient Hospital Stay: Payer: BC Managed Care – PPO

## 2019-03-23 ENCOUNTER — Other Ambulatory Visit: Payer: Self-pay

## 2019-03-23 ENCOUNTER — Encounter: Payer: Self-pay | Admitting: Internal Medicine

## 2019-03-23 ENCOUNTER — Telehealth: Payer: Self-pay | Admitting: Internal Medicine

## 2019-03-23 ENCOUNTER — Inpatient Hospital Stay (HOSPITAL_BASED_OUTPATIENT_CLINIC_OR_DEPARTMENT_OTHER): Payer: BC Managed Care – PPO | Admitting: Internal Medicine

## 2019-03-23 ENCOUNTER — Ambulatory Visit
Admission: RE | Admit: 2019-03-23 | Discharge: 2019-03-23 | Disposition: A | Discharge status: Home / Self Care | Payer: BC Managed Care – PPO | Source: Ambulatory Visit | Attending: Radiation Oncology | Admitting: Radiation Oncology

## 2019-03-23 VITALS — BP 112/89 | HR 84 | Temp 98.7°F | Resp 18 | Ht 68.0 in | Wt 189.2 lb

## 2019-03-23 DIAGNOSIS — Z7984 Long term (current) use of oral hypoglycemic drugs: Secondary | ICD-10-CM | POA: Diagnosis not present

## 2019-03-23 DIAGNOSIS — C3411 Malignant neoplasm of upper lobe, right bronchus or lung: Secondary | ICD-10-CM | POA: Diagnosis not present

## 2019-03-23 DIAGNOSIS — Z510 Encounter for antineoplastic radiation therapy: Secondary | ICD-10-CM | POA: Diagnosis not present

## 2019-03-23 DIAGNOSIS — Z5112 Encounter for antineoplastic immunotherapy: Secondary | ICD-10-CM | POA: Diagnosis not present

## 2019-03-23 DIAGNOSIS — C3491 Malignant neoplasm of unspecified part of right bronchus or lung: Secondary | ICD-10-CM

## 2019-03-23 DIAGNOSIS — Z923 Personal history of irradiation: Secondary | ICD-10-CM | POA: Diagnosis not present

## 2019-03-23 DIAGNOSIS — J45909 Unspecified asthma, uncomplicated: Secondary | ICD-10-CM | POA: Diagnosis not present

## 2019-03-23 DIAGNOSIS — Z79899 Other long term (current) drug therapy: Secondary | ICD-10-CM | POA: Diagnosis not present

## 2019-03-23 DIAGNOSIS — I10 Essential (primary) hypertension: Secondary | ICD-10-CM | POA: Diagnosis not present

## 2019-03-23 DIAGNOSIS — E119 Type 2 diabetes mellitus without complications: Secondary | ICD-10-CM | POA: Diagnosis not present

## 2019-03-23 DIAGNOSIS — C778 Secondary and unspecified malignant neoplasm of lymph nodes of multiple regions: Secondary | ICD-10-CM | POA: Diagnosis not present

## 2019-03-23 LAB — CBC WITH DIFFERENTIAL (CANCER CENTER ONLY)
Abs Immature Granulocytes: 0.04 10*3/uL (ref 0.00–0.07)
Basophils Absolute: 0.1 10*3/uL (ref 0.0–0.1)
Basophils Relative: 1 %
Eosinophils Absolute: 0.3 10*3/uL (ref 0.0–0.5)
Eosinophils Relative: 3 %
HCT: 43 % (ref 39.0–52.0)
Hemoglobin: 14.3 g/dL (ref 13.0–17.0)
Immature Granulocytes: 0 %
Lymphocytes Relative: 7 %
Lymphs Abs: 0.6 10*3/uL — ABNORMAL LOW (ref 0.7–4.0)
MCH: 28.9 pg (ref 26.0–34.0)
MCHC: 33.3 g/dL (ref 30.0–36.0)
MCV: 87 fL (ref 80.0–100.0)
Monocytes Absolute: 1.3 10*3/uL — ABNORMAL HIGH (ref 0.1–1.0)
Monocytes Relative: 14 %
Neutro Abs: 6.9 10*3/uL (ref 1.7–7.7)
Neutrophils Relative %: 75 %
Platelet Count: 198 10*3/uL (ref 150–400)
RBC: 4.94 MIL/uL (ref 4.22–5.81)
RDW: 13.5 % (ref 11.5–15.5)
WBC Count: 9.2 10*3/uL (ref 4.0–10.5)
nRBC: 0 % (ref 0.0–0.2)

## 2019-03-23 LAB — CMP (CANCER CENTER ONLY)
ALT: 80 U/L — ABNORMAL HIGH (ref 0–44)
AST: 36 U/L (ref 15–41)
Albumin: 3.9 g/dL (ref 3.5–5.0)
Alkaline Phosphatase: 78 U/L (ref 38–126)
Anion gap: 10 (ref 5–15)
BUN: 14 mg/dL (ref 6–20)
CO2: 21 mmol/L — ABNORMAL LOW (ref 22–32)
Calcium: 8.8 mg/dL — ABNORMAL LOW (ref 8.9–10.3)
Chloride: 106 mmol/L (ref 98–111)
Creatinine: 0.79 mg/dL (ref 0.61–1.24)
GFR, Est AFR Am: >60 mL/min (ref >60)
GFR, Estimated: >60 mL/min (ref >60)
Glucose, Bld: 100 mg/dL — ABNORMAL HIGH (ref 70–99)
Potassium: 4.3 mmol/L (ref 3.5–5.1)
Sodium: 137 mmol/L (ref 135–145)
Total Bilirubin: 0.3 mg/dL (ref 0.3–1.2)
Total Protein: 6.9 g/dL (ref 6.5–8.1)

## 2019-03-23 MED ORDER — SODIUM CHLORIDE 0.9 % IV SOLN
Freq: Once | INTRAVENOUS | Status: AC
  Administered 2019-03-23: 10:00:00 via INTRAVENOUS
  Filled 2019-03-23: qty 250

## 2019-03-23 MED ORDER — SODIUM CHLORIDE 0.9 % IV SOLN
10.0000 mg/kg | Freq: Once | INTRAVENOUS | Status: AC
  Administered 2019-03-23: 860 mg via INTRAVENOUS
  Filled 2019-03-23: qty 10

## 2019-03-23 NOTE — Patient Instructions (Signed)
[facility] Discharge Instructions for Patients Receiving Chemotherapy  Today you received the following chemotherapy agents: Imfinzi.  To help prevent nausea and vomiting after your treatment, we encourage you to take your nausea medication as directed.   If you develop nausea and vomiting that is not controlled by your nausea medication, call the clinic.   BELOW ARE SYMPTOMS THAT SHOULD BE REPORTED IMMEDIATELY:  *FEVER GREATER THAN 100.5 F  *CHILLS WITH OR WITHOUT FEVER  NAUSEA AND VOMITING THAT IS NOT CONTROLLED WITH YOUR NAUSEA MEDICATION  *UNUSUAL SHORTNESS OF BREATH  *UNUSUAL BRUISING OR BLEEDING  TENDERNESS IN MOUTH AND THROAT WITH OR WITHOUT PRESENCE OF ULCERS  *URINARY PROBLEMS  *BOWEL PROBLEMS  UNUSUAL RASH Items with * indicate a potential emergency and should be followed up as soon as possible.  Feel free to call the clinic should you have any questions or concerns. The clinic phone number is [phone].  Please show the CHEMO ALERT CARD at check-in to the Emergency Department and triage nurse.   

## 2019-03-23 NOTE — Telephone Encounter (Signed)
Added additional cycles per 8/20 los - pt to get an updated schedule next visit.

## 2019-03-23 NOTE — Progress Notes (Signed)
[facility] [phone]   [phone]  OFFICE PROGRESS NOTE  [name], MD [address] [place] [address]  DIAGNOSIS: stage IIIB (T1c, N3, M0)non-small cell lung cancer, adenocarcinoma presented with right upper lobe lung mass in addition to mediastinal and bilateral supraclavicular lymphadenopathy diagnosed in January 2020. PD-L1: 10% Guardant 360 molecular studies showed no actionable mutation.  PRIOR THERAPY: Concurrent chemoradiation with weekly carboplatin for AUC of 2 and paclitaxel 45 mg/M2.  Status post 6 cycles.  Last dose was given on October 10, 2018 with stable disease.  CURRENT THERAPY:  Consolidation treatment with immunotherapy with Imfinzi 10 mg/KG every 2 weeks.  First dose November 17, 2018.  Status post 9 cycles.  INTERVAL HISTORY: Brian [name] 58 y.o. male returns to the clinic today for follow-up visit.  The patient is feeling fine today with no concerning complaints except for soreness of his mouth from the radiation treatment.  He started using Carafate again.  He denied having any current chest pain, shortness of breath, cough or hemoptysis.  He denied having any fever or chills.  He has no nausea, vomiting, diarrhea or constipation.  He denied having any skin rash or itching.  He is here today for evaluation before starting cycle #10.  MEDICAL HISTORY: Past Medical History:  Diagnosis Date   Asthma    Cancer ([place])    Diabetes mellitus without complication ([place])    Hypertension    Subarachnoid hemorrhage ([place])     ALLERGIES:  has No Known Allergies.  MEDICATIONS:  Current Outpatient Medications  Medication Sig Dispense Refill   amLODipine (NORVASC) 5 MG tablet Take by mouth.     atorvastatin (LIPITOR) 20 MG tablet Take by mouth.     metFORMIN (GLUCOPHAGE) 500 MG tablet Take by mouth.     prochlorperazine (COMPAZINE) 10 MG tablet Take 1 tablet (10 mg total) by mouth every 6 (six) hours as  needed for nausea or vomiting. 30 tablet 0   Respiratory Therapy Supplies (FLUTTER) DEVI Please use up 10 times daily(4-5 breaths, 4-5 times daily) 1 each 0   venlafaxine (EFFEXOR) 37.5 MG tablet TAKE 1 TABLET ONCE DAILY.     No current facility-administered medications for this visit.     SURGICAL HISTORY:  Past Surgical History:  Procedure Laterality Date   LEG SURGERY  age 57    left leg, from fracture    REVIEW OF SYSTEMS:  A comprehensive review of systems was negative except for: Ears, nose, mouth, throat, and face: positive for sore throat   PHYSICAL EXAMINATION: General appearance: alert, cooperative and no distress Head: Normocephalic, without obvious abnormality, atraumatic Neck: no adenopathy, no JVD, supple, symmetrical, trachea midline and thyroid not enlarged, symmetric, no tenderness/mass/nodules Lymph nodes: Cervical, supraclavicular, and axillary nodes normal. Resp: clear to auscultation bilaterally Back: symmetric, no curvature. ROM normal. No CVA tenderness. Cardio: regular rate and rhythm, S1, S2 normal, no murmur, click, rub or gallop GI: soft, non-tender; bowel sounds normal; no masses,  no organomegaly Extremities: extremities normal, atraumatic, no cyanosis or edema  ECOG PERFORMANCE STATUS: 1 - Symptomatic but completely ambulatory  Blood pressure 112/89, pulse 84, temperature 98.7 F (37.1 C), temperature source Oral, resp. rate 18, height _0  (1.727 m), weight 189 lb 3.2 oz (85.8 kg), SpO2 98 %.  LABORATORY DATA: Lab Results  Component Value Date   WBC 9.6 03/09/2019   HGB 14.1 03/09/2019   HCT 41.7 03/09/2019   MCV  86.2 03/09/2019   PLT 198 03/09/2019      Chemistry      Component Value Date/Time   NA 140 03/09/2019 1247   K 4.1 03/09/2019 1247   CL 108 03/09/2019 1247   CO2 20 (L) 03/09/2019 1247   BUN 13 03/09/2019 1247   CREATININE 0.81 03/09/2019 1247      Component Value Date/Time   CALCIUM 9.3 03/09/2019 1247   ALKPHOS 81  03/09/2019 1247   AST 26 03/09/2019 1247   ALT 49 (H) 03/09/2019 1247   BILITOT 0.2 (L) 03/09/2019 1247       RADIOGRAPHIC STUDIES: No results found.  ASSESSMENT AND PLAN: This is a very pleasant 58 years old white male with a stage IIIb non-small cell lung cancer, adenocarcinoma diagnosed in January 2020 with PDL 1 expression of 10% and no actionable mutations on Guardant 360 blood test. The patient underwent a course of concurrent chemoradiation with weekly carboplatin and paclitaxel status post 6 cycles.  He tolerated this course fairly well except for fatigue and odynophagia which significantly improved. The patient is currently undergoing consolidation treatment with Imfinzi 10 mg/KG every 2 weeks status post 9 cycles.  His restaging scan after cycle #6 showed new cluster of mildly enlarged lymph nodes in the right posterior triangle of the neck.  The patient is also undergoing palliative radiotherapy to this area under the care of Dr. Sondra Come. The patient continues to tolerate his treatment with immunotherapy fairly well. I recommended for him to proceed with cycle #10 today as planned. I will see him back for follow-up visit in 2 weeks for evaluation before the next cycle of his treatment. He will have repeat imaging studies of the neck and chest after completion of cycle #12. The patient was advised to call immediately if he has any concerning symptoms in the interval. The patient voices understanding of current disease status and treatment options and is in agreement with the current care plan. All questions were answered. The patient knows to call the clinic with any problems, questions or concerns. We can certainly see the patient much sooner if necessary.  Disclaimer: This note was dictated with voice recognition software. Similar sounding words can inadvertently be transcribed and may not be corrected upon review.

## 2019-03-24 ENCOUNTER — Other Ambulatory Visit: Payer: Self-pay

## 2019-03-24 ENCOUNTER — Ambulatory Visit
Admission: RE | Admit: 2019-03-24 | Discharge: 2019-03-24 | Disposition: A | Discharge status: Home / Self Care | Payer: BC Managed Care – PPO | Source: Ambulatory Visit | Attending: Radiation Oncology | Admitting: Radiation Oncology

## 2019-03-24 DIAGNOSIS — C778 Secondary and unspecified malignant neoplasm of lymph nodes of multiple regions: Secondary | ICD-10-CM | POA: Diagnosis not present

## 2019-03-24 DIAGNOSIS — C3411 Malignant neoplasm of upper lobe, right bronchus or lung: Secondary | ICD-10-CM | POA: Diagnosis not present

## 2019-03-24 DIAGNOSIS — Z510 Encounter for antineoplastic radiation therapy: Secondary | ICD-10-CM | POA: Diagnosis not present

## 2019-03-27 ENCOUNTER — Ambulatory Visit
Admission: RE | Admit: 2019-03-27 | Discharge: 2019-03-27 | Disposition: A | Discharge status: Home / Self Care | Payer: BC Managed Care – PPO | Source: Ambulatory Visit | Attending: Radiation Oncology | Admitting: Radiation Oncology

## 2019-03-27 ENCOUNTER — Other Ambulatory Visit: Payer: Self-pay

## 2019-03-27 DIAGNOSIS — C778 Secondary and unspecified malignant neoplasm of lymph nodes of multiple regions: Secondary | ICD-10-CM | POA: Diagnosis not present

## 2019-03-27 DIAGNOSIS — Z510 Encounter for antineoplastic radiation therapy: Secondary | ICD-10-CM | POA: Diagnosis not present

## 2019-03-27 DIAGNOSIS — C3411 Malignant neoplasm of upper lobe, right bronchus or lung: Secondary | ICD-10-CM | POA: Diagnosis not present

## 2019-03-28 ENCOUNTER — Ambulatory Visit
Admission: RE | Admit: 2019-03-28 | Discharge: 2019-03-28 | Disposition: A | Discharge status: Home / Self Care | Payer: BC Managed Care – PPO | Source: Ambulatory Visit | Attending: Radiation Oncology | Admitting: Radiation Oncology

## 2019-03-28 ENCOUNTER — Other Ambulatory Visit: Payer: Self-pay

## 2019-03-28 DIAGNOSIS — C3411 Malignant neoplasm of upper lobe, right bronchus or lung: Secondary | ICD-10-CM | POA: Diagnosis not present

## 2019-03-28 DIAGNOSIS — Z510 Encounter for antineoplastic radiation therapy: Secondary | ICD-10-CM | POA: Diagnosis not present

## 2019-03-28 DIAGNOSIS — C778 Secondary and unspecified malignant neoplasm of lymph nodes of multiple regions: Secondary | ICD-10-CM | POA: Diagnosis not present

## 2019-03-29 ENCOUNTER — Other Ambulatory Visit: Payer: Self-pay

## 2019-03-29 ENCOUNTER — Ambulatory Visit
Admission: RE | Admit: 2019-03-29 | Discharge: 2019-03-29 | Disposition: A | Discharge status: Home / Self Care | Payer: BC Managed Care – PPO | Source: Ambulatory Visit | Attending: Radiation Oncology | Admitting: Radiation Oncology

## 2019-03-29 DIAGNOSIS — C3411 Malignant neoplasm of upper lobe, right bronchus or lung: Secondary | ICD-10-CM | POA: Diagnosis not present

## 2019-03-29 DIAGNOSIS — C778 Secondary and unspecified malignant neoplasm of lymph nodes of multiple regions: Secondary | ICD-10-CM | POA: Diagnosis not present

## 2019-03-29 DIAGNOSIS — Z510 Encounter for antineoplastic radiation therapy: Secondary | ICD-10-CM | POA: Diagnosis not present

## 2019-03-30 ENCOUNTER — Other Ambulatory Visit: Payer: Self-pay

## 2019-03-30 ENCOUNTER — Ambulatory Visit
Admission: RE | Admit: 2019-03-30 | Discharge: 2019-03-30 | Disposition: A | Discharge status: Home / Self Care | Payer: BC Managed Care – PPO | Source: Ambulatory Visit | Attending: Radiation Oncology | Admitting: Radiation Oncology

## 2019-03-30 DIAGNOSIS — C778 Secondary and unspecified malignant neoplasm of lymph nodes of multiple regions: Secondary | ICD-10-CM | POA: Diagnosis not present

## 2019-03-30 DIAGNOSIS — C3411 Malignant neoplasm of upper lobe, right bronchus or lung: Secondary | ICD-10-CM | POA: Diagnosis not present

## 2019-03-30 DIAGNOSIS — Z510 Encounter for antineoplastic radiation therapy: Secondary | ICD-10-CM | POA: Diagnosis not present

## 2019-03-31 ENCOUNTER — Other Ambulatory Visit: Payer: Self-pay

## 2019-03-31 ENCOUNTER — Ambulatory Visit
Admission: RE | Admit: 2019-03-31 | Discharge: 2019-03-31 | Disposition: A | Discharge status: Home / Self Care | Payer: BC Managed Care – PPO | Source: Ambulatory Visit | Attending: Radiation Oncology | Admitting: Radiation Oncology

## 2019-03-31 DIAGNOSIS — Z510 Encounter for antineoplastic radiation therapy: Secondary | ICD-10-CM | POA: Diagnosis not present

## 2019-03-31 DIAGNOSIS — C778 Secondary and unspecified malignant neoplasm of lymph nodes of multiple regions: Secondary | ICD-10-CM | POA: Diagnosis not present

## 2019-03-31 DIAGNOSIS — C3411 Malignant neoplasm of upper lobe, right bronchus or lung: Secondary | ICD-10-CM | POA: Diagnosis not present

## 2019-04-03 ENCOUNTER — Ambulatory Visit
Admission: RE | Admit: 2019-04-03 | Discharge: 2019-04-03 | Disposition: A | Discharge status: Home / Self Care | Payer: BC Managed Care – PPO | Source: Ambulatory Visit | Attending: Radiation Oncology | Admitting: Radiation Oncology

## 2019-04-03 ENCOUNTER — Other Ambulatory Visit: Payer: Self-pay

## 2019-04-03 DIAGNOSIS — C778 Secondary and unspecified malignant neoplasm of lymph nodes of multiple regions: Secondary | ICD-10-CM | POA: Diagnosis not present

## 2019-04-03 DIAGNOSIS — C3411 Malignant neoplasm of upper lobe, right bronchus or lung: Secondary | ICD-10-CM | POA: Diagnosis not present

## 2019-04-03 DIAGNOSIS — Z510 Encounter for antineoplastic radiation therapy: Secondary | ICD-10-CM | POA: Diagnosis not present

## 2019-04-04 ENCOUNTER — Ambulatory Visit
Admission: RE | Admit: 2019-04-04 | Discharge: 2019-04-04 | Disposition: A | Discharge status: Home / Self Care | Payer: BC Managed Care – PPO | Source: Ambulatory Visit | Attending: Radiation Oncology | Admitting: Radiation Oncology

## 2019-04-04 ENCOUNTER — Other Ambulatory Visit: Payer: Self-pay

## 2019-04-04 DIAGNOSIS — C3411 Malignant neoplasm of upper lobe, right bronchus or lung: Secondary | ICD-10-CM | POA: Insufficient documentation

## 2019-04-04 DIAGNOSIS — C778 Secondary and unspecified malignant neoplasm of lymph nodes of multiple regions: Secondary | ICD-10-CM | POA: Diagnosis not present

## 2019-04-04 DIAGNOSIS — Z510 Encounter for antineoplastic radiation therapy: Secondary | ICD-10-CM | POA: Insufficient documentation

## 2019-04-05 ENCOUNTER — Other Ambulatory Visit: Payer: Self-pay

## 2019-04-05 ENCOUNTER — Ambulatory Visit
Admission: RE | Admit: 2019-04-05 | Discharge: 2019-04-05 | Disposition: A | Discharge status: Home / Self Care | Payer: BC Managed Care – PPO | Source: Ambulatory Visit | Attending: Radiation Oncology | Admitting: Radiation Oncology

## 2019-04-05 DIAGNOSIS — C778 Secondary and unspecified malignant neoplasm of lymph nodes of multiple regions: Secondary | ICD-10-CM | POA: Diagnosis not present

## 2019-04-05 DIAGNOSIS — Z510 Encounter for antineoplastic radiation therapy: Secondary | ICD-10-CM | POA: Diagnosis not present

## 2019-04-05 DIAGNOSIS — C3411 Malignant neoplasm of upper lobe, right bronchus or lung: Secondary | ICD-10-CM | POA: Diagnosis not present

## 2019-04-06 ENCOUNTER — Inpatient Hospital Stay: Payer: BC Managed Care – PPO

## 2019-04-06 ENCOUNTER — Other Ambulatory Visit: Payer: Self-pay

## 2019-04-06 ENCOUNTER — Ambulatory Visit
Admission: RE | Admit: 2019-04-06 | Discharge: 2019-04-06 | Disposition: A | Discharge status: Home / Self Care | Payer: BC Managed Care – PPO | Source: Ambulatory Visit | Attending: Radiation Oncology | Admitting: Radiation Oncology

## 2019-04-06 ENCOUNTER — Inpatient Hospital Stay (HOSPITAL_BASED_OUTPATIENT_CLINIC_OR_DEPARTMENT_OTHER): Payer: BC Managed Care – PPO | Admitting: Physician Assistant

## 2019-04-06 ENCOUNTER — Inpatient Hospital Stay: Payer: BC Managed Care – PPO | Attending: Internal Medicine

## 2019-04-06 VITALS — BP 127/93 | HR 83 | Temp 99.1°F | Resp 18 | Ht 68.0 in | Wt 192.2 lb

## 2019-04-06 DIAGNOSIS — R1310 Dysphagia, unspecified: Secondary | ICD-10-CM | POA: Diagnosis not present

## 2019-04-06 DIAGNOSIS — J45909 Unspecified asthma, uncomplicated: Secondary | ICD-10-CM | POA: Insufficient documentation

## 2019-04-06 DIAGNOSIS — Z510 Encounter for antineoplastic radiation therapy: Secondary | ICD-10-CM | POA: Diagnosis not present

## 2019-04-06 DIAGNOSIS — Z7984 Long term (current) use of oral hypoglycemic drugs: Secondary | ICD-10-CM | POA: Diagnosis not present

## 2019-04-06 DIAGNOSIS — E119 Type 2 diabetes mellitus without complications: Secondary | ICD-10-CM | POA: Insufficient documentation

## 2019-04-06 DIAGNOSIS — Z79899 Other long term (current) drug therapy: Secondary | ICD-10-CM | POA: Insufficient documentation

## 2019-04-06 DIAGNOSIS — Z5112 Encounter for antineoplastic immunotherapy: Secondary | ICD-10-CM | POA: Diagnosis not present

## 2019-04-06 DIAGNOSIS — C3411 Malignant neoplasm of upper lobe, right bronchus or lung: Secondary | ICD-10-CM | POA: Insufficient documentation

## 2019-04-06 DIAGNOSIS — Z923 Personal history of irradiation: Secondary | ICD-10-CM | POA: Diagnosis not present

## 2019-04-06 DIAGNOSIS — I10 Essential (primary) hypertension: Secondary | ICD-10-CM | POA: Diagnosis not present

## 2019-04-06 DIAGNOSIS — C3491 Malignant neoplasm of unspecified part of right bronchus or lung: Secondary | ICD-10-CM | POA: Diagnosis not present

## 2019-04-06 DIAGNOSIS — C778 Secondary and unspecified malignant neoplasm of lymph nodes of multiple regions: Secondary | ICD-10-CM | POA: Diagnosis not present

## 2019-04-06 LAB — CMP (CANCER CENTER ONLY)
ALT: 79 U/L — ABNORMAL HIGH (ref 0–44)
AST: 33 U/L (ref 15–41)
Albumin: 4.1 g/dL (ref 3.5–5.0)
Alkaline Phosphatase: 82 U/L (ref 38–126)
Anion gap: 7 (ref 5–15)
BUN: 15 mg/dL (ref 6–20)
CO2: 24 mmol/L (ref 22–32)
Calcium: 9.1 mg/dL (ref 8.9–10.3)
Chloride: 106 mmol/L (ref 98–111)
Creatinine: 0.77 mg/dL (ref 0.61–1.24)
GFR, Est AFR Am: >60 mL/min (ref >60)
GFR, Estimated: >60 mL/min (ref >60)
Glucose, Bld: 87 mg/dL (ref 70–99)
Potassium: 4.3 mmol/L (ref 3.5–5.1)
Sodium: 137 mmol/L (ref 135–145)
Total Bilirubin: 0.3 mg/dL (ref 0.3–1.2)
Total Protein: 6.9 g/dL (ref 6.5–8.1)

## 2019-04-06 LAB — CBC WITH DIFFERENTIAL (CANCER CENTER ONLY)
Abs Immature Granulocytes: 0.04 10*3/uL (ref 0.00–0.07)
Basophils Absolute: 0.1 10*3/uL (ref 0.0–0.1)
Basophils Relative: 1 %
Eosinophils Absolute: 0.3 10*3/uL (ref 0.0–0.5)
Eosinophils Relative: 3 %
HCT: 43.5 % (ref 39.0–52.0)
Hemoglobin: 14.4 g/dL (ref 13.0–17.0)
Immature Granulocytes: 1 %
Lymphocytes Relative: 7 %
Lymphs Abs: 0.6 10*3/uL — ABNORMAL LOW (ref 0.7–4.0)
MCH: 29 pg (ref 26.0–34.0)
MCHC: 33.1 g/dL (ref 30.0–36.0)
MCV: 87.5 fL (ref 80.0–100.0)
Monocytes Absolute: 1.4 10*3/uL — ABNORMAL HIGH (ref 0.1–1.0)
Monocytes Relative: 16 %
Neutro Abs: 6.3 10*3/uL (ref 1.7–7.7)
Neutrophils Relative %: 72 %
Platelet Count: 189 10*3/uL (ref 150–400)
RBC: 4.97 MIL/uL (ref 4.22–5.81)
RDW: 14.2 % (ref 11.5–15.5)
WBC Count: 8.6 10*3/uL (ref 4.0–10.5)
nRBC: 0 % (ref 0.0–0.2)

## 2019-04-06 LAB — TSH: TSH: 4.211 u[IU]/mL — ABNORMAL HIGH (ref 0.320–4.118)

## 2019-04-06 MED ORDER — SODIUM CHLORIDE 0.9 % IV SOLN
10.0000 mg/kg | Freq: Once | INTRAVENOUS | Status: AC
  Administered 2019-04-06: 860 mg via INTRAVENOUS
  Filled 2019-04-06: qty 10

## 2019-04-06 MED ORDER — SODIUM CHLORIDE 0.9 % IV SOLN
Freq: Once | INTRAVENOUS | Status: AC
  Administered 2019-04-06: 12:00:00 via INTRAVENOUS
  Filled 2019-04-06: qty 250

## 2019-04-06 NOTE — Progress Notes (Signed)
[facility] OFFICE PROGRESS NOTE  [name], MD [address] [place] [address]  DIAGNOSIS: Stage IIIB (T1c, N3, M0)non-small cell lung cancer, adenocarcinoma presented with right upper lobe lung mass in addition to mediastinal and bilateral supraclavicular lymphadenopathy diagnosed in January 2020. PD-L1: 10% Guardant 360 molecular studiesshowed no actionable mutation.  PRIOR THERAPY: Concurrent chemoradiation with weekly carboplatin for AUC of 2 and paclitaxel 45 mg/M2.Status post 6 cycles. Last dose was given on October 10, 2018 with stable disease.  CURRENT THERAPY:  1) Consolidation treatment with immunotherapy with Imfinzi 10 mg/KG every 2 weeks. First dose November 17, 2018. Status post10cycles. 2) Radiation treatment to the enlarging right cervical lymph nodes under the care of Dr. Sondra Come. First treatment 03/06/2019. Last treatment scheduled for 04/13/2019  INTERVAL HISTORY: Marquavis Hannen 58 y.o. male returns to the clinic for a follow-up visit.  The patient is doing fairly well today without any concerning complaints except for fatigue. The patient usually walks 3 miles with his wife but has not done so in several weeks. He states that he is going to start walking again. The patient is currently undergoing radiation treatment to the enlarging right cervical lymph node. He is scheduled for his last radiation treatment in 04/13/2019.  He has been doing fairly well with radiation treatment except he has developed a radiation burn for which he uses cream several times a day.  He also has been experiencing a dry mouth and has been using Biotene rinses and drinking lots of water with some relief.  Regarding his treatment with immunotherapy, the patient has been tolerating treatment without any adverse side effects.  He denies any recent fever, chills, night sweats, or weight loss. He actually gained 10 lbs over the last several weeks.  Denies any significant  chest pain, shortness of breath, cough, or hemoptysis.  He denies any nausea, vomiting, diarrhea, or constipation.  He denies any headache or visual changes.  He denies any rashes or skin changes.  The patient has an appointment with his psychologist tomorrow.  He is here today for evaluation before starting cycle #11.   MEDICAL HISTORY: Past Medical History:  Diagnosis Date  . Asthma   . Cancer ([place])   . Diabetes mellitus without complication ([place])   . Hypertension   . Subarachnoid hemorrhage ([facility])     ALLERGIES:  has No Known Allergies.  MEDICATIONS:  Current Outpatient Medications  Medication Sig Dispense Refill  . amLODipine (NORVASC) 5 MG tablet Take by mouth.    Marland Kitchen atorvastatin (LIPITOR) 20 MG tablet Take by mouth.    . metFORMIN (GLUCOPHAGE) 500 MG tablet Take by mouth.    . prochlorperazine (COMPAZINE) 10 MG tablet Take 1 tablet (10 mg total) by mouth every 6 (six) hours as needed for nausea or vomiting. 30 tablet 0  . Respiratory Therapy Supplies (FLUTTER) DEVI Please use up 10 times daily(4-5 breaths, 4-5 times daily) 1 each 0  . venlafaxine (EFFEXOR) 37.5 MG tablet TAKE 1 TABLET ONCE DAILY.     No current facility-administered medications for this visit.     SURGICAL HISTORY:  Past Surgical History:  Procedure Laterality Date  . LEG SURGERY  age 34    left leg, from fracture    REVIEW OF SYSTEMS:   Review of Systems  Constitutional: Positive for fatigue. Negative for appetite change, chills, fever and unexpected weight change.  HENT: Positive for dry mouth secondary to radiation treatment. Negative for mouth sores, nosebleeds, sore  throat and trouble swallowing.   Eyes: Negative for eye problems and icterus.  Respiratory: Negative for cough, hemoptysis, shortness of breath and wheezing.   Cardiovascular: Negative for chest pain and leg swelling.  Gastrointestinal: Negative for abdominal pain, constipation, diarrhea, nausea and vomiting.  Genitourinary: Negative for  bladder incontinence, difficulty urinating, dysuria, frequency and hematuria.   Musculoskeletal: Negative for back pain, gait problem, neck pain and neck stiffness.  Skin: Positive for erythema of the right cervical neck secondary to radiation treatment. Negative for itching  Neurological: Negative for dizziness, extremity weakness, gait problem, headaches, light-headedness and seizures.  Hematological: Positive for right cervical adenopathy. Does not bruise/bleed easily.  Psychiatric/Behavioral: Negative for confusion, depression and sleep disturbance. The patient is not nervous/anxious.     PHYSICAL EXAMINATION:  Blood pressure (!) 127/93, pulse 83, temperature 99.1 F (37.3 C), temperature source Oral, resp. rate 18, height _0  (1.727 m), weight 192 lb 3.2 oz (87.2 kg), SpO2 97 %.  ECOG PERFORMANCE STATUS: 1 - Symptomatic but completely ambulatory  Physical Exam  Constitutional: Oriented to person, place, and time and well-developed, well-nourished, and in no distress. No distress.  HENT:  Head: Normocephalic and atraumatic.  Mouth/Throat: Oropharynx is clear and moist. No oropharyngeal exudate.  Eyes: Conjunctivae are normal. Right eye exhibits no discharge. Left eye exhibits no discharge. No scleral icterus.  Neck: Normal range of motion. Neck supple.  Cardiovascular: Normal rate, regular rhythm, normal heart sounds and intact distal pulses.   Pulmonary/Chest: Effort normal and breath sounds normal. No respiratory distress. No wheezes. No rales.  Abdominal: Soft. Bowel sounds are normal. Exhibits no distension and no mass. There is no tenderness.  Musculoskeletal: Normal range of motion. Exhibits no edema.  Lymphadenopathy:    Positive for right cervical adenopathy. Neurological: Alert and oriented to person, place, and time. Exhibits normal muscle tone. Gait normal. Coordination normal.  Skin: Erythema over the right cervical neck secondary to his radiation treatment. Skin is warm  and dry. Not diaphoretic.  No pallor.  Psychiatric: Mood, memory and judgment normal.  Vitals reviewed.  LABORATORY DATA: Lab Results  Component Value Date   WBC 8.6 04/06/2019   HGB 14.4 04/06/2019   HCT 43.5 04/06/2019   MCV 87.5 04/06/2019   PLT 189 04/06/2019      Chemistry      Component Value Date/Time   NA 137 04/06/2019 1025   K 4.3 04/06/2019 1025   CL 106 04/06/2019 1025   CO2 24 04/06/2019 1025   BUN 15 04/06/2019 1025   CREATININE 0.77 04/06/2019 1025      Component Value Date/Time   CALCIUM 9.1 04/06/2019 1025   ALKPHOS 82 04/06/2019 1025   AST 33 04/06/2019 1025   ALT 79 (H) 04/06/2019 1025   BILITOT 0.3 04/06/2019 1025       RADIOGRAPHIC STUDIES:  No results found.   ASSESSMENT/PLAN:  This is a very pleasant 58 year old Caucasian male diagnosed with stage IIIb non-small cell lung cancer, adenocarcinoma.He presented with a right upper lobe lung mass in addition to mediastinal and bilateral supraclavicular lymphadenopathy. He was diagnosed in January 2020. His PDL 1 expression is 10% and he has no actionable mutations.  He underwent a course of concurrent chemoradiation with weekly carboplatin and paclitaxel. He is status post 6 cycles. He tolerated well except for fatigue and odynophagia.   He is currently undergoing consolidation immunotherapy with Imfinzi 10 mg/kg IV every 2 weeks. He is status post10cycles of treatment. He tolerated it well without any  adverse side effects.He is also undergoing radiation to the enlarging right cervical lymph nodes. He will receive 30 treatments. His last radiation treatment is scheduled for 04/13/2019  Labs were reviewed today with the patient. Irecommend that he proceed with cycle #11 today as scheduled.   I will arrange for the patient to be a restaging CT scan of the chest as well as the neck prior to his next visit.  We will see him back for a follow up visit in 2 weeks for evaluation and to review  his scan results before starting cycle #12  I have sent a referral for a psychologist at the patient's request for therapy/counseling for his recent stress/cancer diagnosis.   He will follow up with his psychologist tomorrow as scheduled.   The patient was advised to call immediately if he has any concerning symptoms in the interval. The patient voices understanding of current disease status and treatment options and is in agreement with the current care plan. All questions were answered. The patient knows to call the clinic with any problems, questions or concerns. We can certainly see the patient much sooner if necessary  Orders Placed This Encounter  Procedures  . CT Chest W Contrast    Standing Status:   Future    Standing Expiration Date:   04/05/2020    Order Specific Question:   ** REASON FOR EXAM (FREE TEXT)    Answer:   Restaging Lung Cancer    Order Specific Question:   If indicated for the ordered procedure, I authorize the administration of contrast media per Radiology protocol    Answer:   Yes    Order Specific Question:   Preferred imaging location?    Answer:   [facility]    Order Specific Question:   Radiology Contrast Protocol - do NOT remove file path    Answer:   \\[email or web address]  . CT Soft Tissue Neck W Contrast    Standing Status:   Future    Standing Expiration Date:   04/05/2020    Order Specific Question:   ** REASON FOR EXAM (FREE TEXT)    Answer:   Restaging Lung Cancer    Order Specific Question:   If indicated for the ordered procedure, I authorize the administration of contrast media per Radiology protocol    Answer:   Yes    Order Specific Question:   Preferred imaging location?    Answer:   [facility]    Order Specific Question:   Radiology Contrast Protocol - do NOT remove file path    Answer:   \\[email or web address]     [place], PA-C 04/06/19

## 2019-04-06 NOTE — Patient Instructions (Signed)
[facility] Discharge Instructions for Patients Receiving Chemotherapy  Today you received the following chemotherapy agents: Imfinzi.  To help prevent nausea and vomiting after your treatment, we encourage you to take your nausea medication as directed.   If you develop nausea and vomiting that is not controlled by your nausea medication, call the clinic.   BELOW ARE SYMPTOMS THAT SHOULD BE REPORTED IMMEDIATELY:  *FEVER GREATER THAN 100.5 F  *CHILLS WITH OR WITHOUT FEVER  NAUSEA AND VOMITING THAT IS NOT CONTROLLED WITH YOUR NAUSEA MEDICATION  *UNUSUAL SHORTNESS OF BREATH  *UNUSUAL BRUISING OR BLEEDING  TENDERNESS IN MOUTH AND THROAT WITH OR WITHOUT PRESENCE OF ULCERS  *URINARY PROBLEMS  *BOWEL PROBLEMS  UNUSUAL RASH Items with * indicate a potential emergency and should be followed up as soon as possible.  Feel free to call the clinic should you have any questions or concerns. The clinic phone number is [phone].  Please show the CHEMO ALERT CARD at check-in to the Emergency Department and triage nurse.   

## 2019-04-06 NOTE — Patient Instructions (Addendum)
-  Radiology scheduling: [phone] -Billing Questions: [phone]

## 2019-04-07 ENCOUNTER — Ambulatory Visit
Admission: RE | Admit: 2019-04-07 | Discharge: 2019-04-07 | Disposition: A | Discharge status: Home / Self Care | Payer: BC Managed Care – PPO | Source: Ambulatory Visit | Attending: Radiation Oncology | Admitting: Radiation Oncology

## 2019-04-07 ENCOUNTER — Other Ambulatory Visit: Payer: Self-pay

## 2019-04-07 ENCOUNTER — Telehealth: Payer: Self-pay | Admitting: Physician Assistant

## 2019-04-07 ENCOUNTER — Ambulatory Visit (INDEPENDENT_AMBULATORY_CARE_PROVIDER_SITE_OTHER): Payer: BC Managed Care – PPO | Admitting: Psychology

## 2019-04-07 DIAGNOSIS — F411 Generalized anxiety disorder: Secondary | ICD-10-CM | POA: Diagnosis not present

## 2019-04-07 DIAGNOSIS — C778 Secondary and unspecified malignant neoplasm of lymph nodes of multiple regions: Secondary | ICD-10-CM | POA: Diagnosis not present

## 2019-04-07 DIAGNOSIS — Z510 Encounter for antineoplastic radiation therapy: Secondary | ICD-10-CM | POA: Diagnosis not present

## 2019-04-07 DIAGNOSIS — C3411 Malignant neoplasm of upper lobe, right bronchus or lung: Secondary | ICD-10-CM | POA: Diagnosis not present

## 2019-04-07 NOTE — Telephone Encounter (Signed)
Per 9/3 los already scheduled

## 2019-04-11 ENCOUNTER — Ambulatory Visit
Admission: RE | Admit: 2019-04-11 | Discharge: 2019-04-11 | Disposition: A | Discharge status: Home / Self Care | Payer: BC Managed Care – PPO | Source: Ambulatory Visit | Attending: Radiation Oncology | Admitting: Radiation Oncology

## 2019-04-11 ENCOUNTER — Other Ambulatory Visit: Payer: Self-pay

## 2019-04-11 DIAGNOSIS — Z510 Encounter for antineoplastic radiation therapy: Secondary | ICD-10-CM | POA: Diagnosis not present

## 2019-04-11 DIAGNOSIS — C778 Secondary and unspecified malignant neoplasm of lymph nodes of multiple regions: Secondary | ICD-10-CM | POA: Diagnosis not present

## 2019-04-11 DIAGNOSIS — C3411 Malignant neoplasm of upper lobe, right bronchus or lung: Secondary | ICD-10-CM | POA: Diagnosis not present

## 2019-04-12 ENCOUNTER — Other Ambulatory Visit: Payer: Self-pay

## 2019-04-12 ENCOUNTER — Ambulatory Visit
Admission: RE | Admit: 2019-04-12 | Discharge: 2019-04-12 | Disposition: A | Discharge status: Home / Self Care | Payer: BC Managed Care – PPO | Source: Ambulatory Visit | Attending: Radiation Oncology | Admitting: Radiation Oncology

## 2019-04-12 DIAGNOSIS — C778 Secondary and unspecified malignant neoplasm of lymph nodes of multiple regions: Secondary | ICD-10-CM | POA: Diagnosis not present

## 2019-04-12 DIAGNOSIS — Z510 Encounter for antineoplastic radiation therapy: Secondary | ICD-10-CM | POA: Diagnosis not present

## 2019-04-12 DIAGNOSIS — C3411 Malignant neoplasm of upper lobe, right bronchus or lung: Secondary | ICD-10-CM | POA: Diagnosis not present

## 2019-04-13 ENCOUNTER — Ambulatory Visit
Admission: RE | Admit: 2019-04-13 | Discharge: 2019-04-13 | Disposition: A | Discharge status: Home / Self Care | Payer: BC Managed Care – PPO | Source: Ambulatory Visit | Attending: Radiation Oncology | Admitting: Radiation Oncology

## 2019-04-13 ENCOUNTER — Encounter: Payer: Self-pay | Admitting: Radiation Oncology

## 2019-04-13 ENCOUNTER — Other Ambulatory Visit: Payer: Self-pay

## 2019-04-13 DIAGNOSIS — Z510 Encounter for antineoplastic radiation therapy: Secondary | ICD-10-CM | POA: Diagnosis not present

## 2019-04-13 DIAGNOSIS — C3411 Malignant neoplasm of upper lobe, right bronchus or lung: Secondary | ICD-10-CM | POA: Diagnosis not present

## 2019-04-13 DIAGNOSIS — C778 Secondary and unspecified malignant neoplasm of lymph nodes of multiple regions: Secondary | ICD-10-CM | POA: Diagnosis not present

## 2019-04-14 DIAGNOSIS — J329 Chronic sinusitis, unspecified: Secondary | ICD-10-CM | POA: Diagnosis not present

## 2019-04-18 ENCOUNTER — Ambulatory Visit (HOSPITAL_COMMUNITY)
Admission: RE | Admit: 2019-04-18 | Discharge: 2019-04-18 | Disposition: A | Discharge status: Home / Self Care | Payer: BC Managed Care – PPO | Source: Ambulatory Visit | Attending: Physician Assistant | Admitting: Physician Assistant

## 2019-04-18 ENCOUNTER — Other Ambulatory Visit: Payer: Self-pay

## 2019-04-18 DIAGNOSIS — C3491 Malignant neoplasm of unspecified part of right bronchus or lung: Secondary | ICD-10-CM | POA: Diagnosis not present

## 2019-04-18 DIAGNOSIS — C3490 Malignant neoplasm of unspecified part of unspecified bronchus or lung: Secondary | ICD-10-CM | POA: Diagnosis not present

## 2019-04-18 MED ORDER — IOHEXOL 300 MG/ML  SOLN
100.0000 mL | Freq: Once | INTRAMUSCULAR | Status: AC | PRN
  Administered 2019-04-18: 100 mL via INTRAVENOUS

## 2019-04-18 MED ORDER — SODIUM CHLORIDE (PF) 0.9 % IJ SOLN
INTRAMUSCULAR | Status: AC
  Filled 2019-04-18: qty 50

## 2019-04-20 ENCOUNTER — Inpatient Hospital Stay (HOSPITAL_BASED_OUTPATIENT_CLINIC_OR_DEPARTMENT_OTHER): Payer: BC Managed Care – PPO | Admitting: Internal Medicine

## 2019-04-20 ENCOUNTER — Inpatient Hospital Stay: Payer: BC Managed Care – PPO

## 2019-04-20 ENCOUNTER — Other Ambulatory Visit: Payer: Self-pay

## 2019-04-20 ENCOUNTER — Encounter: Payer: Self-pay | Admitting: Internal Medicine

## 2019-04-20 ENCOUNTER — Telehealth: Payer: Self-pay | Admitting: Internal Medicine

## 2019-04-20 VITALS — BP 121/90 | HR 97 | Temp 97.8°F | Resp 17 | Ht 68.0 in | Wt 192.5 lb

## 2019-04-20 DIAGNOSIS — C3491 Malignant neoplasm of unspecified part of right bronchus or lung: Secondary | ICD-10-CM

## 2019-04-20 DIAGNOSIS — E119 Type 2 diabetes mellitus without complications: Secondary | ICD-10-CM | POA: Diagnosis not present

## 2019-04-20 DIAGNOSIS — C3411 Malignant neoplasm of upper lobe, right bronchus or lung: Secondary | ICD-10-CM | POA: Diagnosis not present

## 2019-04-20 DIAGNOSIS — Z5112 Encounter for antineoplastic immunotherapy: Secondary | ICD-10-CM | POA: Diagnosis not present

## 2019-04-20 DIAGNOSIS — J45909 Unspecified asthma, uncomplicated: Secondary | ICD-10-CM | POA: Diagnosis not present

## 2019-04-20 DIAGNOSIS — Z7984 Long term (current) use of oral hypoglycemic drugs: Secondary | ICD-10-CM | POA: Diagnosis not present

## 2019-04-20 DIAGNOSIS — I10 Essential (primary) hypertension: Secondary | ICD-10-CM | POA: Diagnosis not present

## 2019-04-20 DIAGNOSIS — Z79899 Other long term (current) drug therapy: Secondary | ICD-10-CM | POA: Diagnosis not present

## 2019-04-20 DIAGNOSIS — R1310 Dysphagia, unspecified: Secondary | ICD-10-CM | POA: Diagnosis not present

## 2019-04-20 DIAGNOSIS — Z923 Personal history of irradiation: Secondary | ICD-10-CM | POA: Diagnosis not present

## 2019-04-20 LAB — CBC WITH DIFFERENTIAL (CANCER CENTER ONLY)
Abs Immature Granulocytes: 0.06 10*3/uL (ref 0.00–0.07)
Basophils Absolute: 0.1 10*3/uL (ref 0.0–0.1)
Basophils Relative: 1 %
Eosinophils Absolute: 0.2 10*3/uL (ref 0.0–0.5)
Eosinophils Relative: 3 %
HCT: 42.9 % (ref 39.0–52.0)
Hemoglobin: 14.4 g/dL (ref 13.0–17.0)
Immature Granulocytes: 1 %
Lymphocytes Relative: 7 %
Lymphs Abs: 0.6 10*3/uL — ABNORMAL LOW (ref 0.7–4.0)
MCH: 29.3 pg (ref 26.0–34.0)
MCHC: 33.6 g/dL (ref 30.0–36.0)
MCV: 87.4 fL (ref 80.0–100.0)
Monocytes Absolute: 1.1 10*3/uL — ABNORMAL HIGH (ref 0.1–1.0)
Monocytes Relative: 13 %
Neutro Abs: 6.3 10*3/uL (ref 1.7–7.7)
Neutrophils Relative %: 75 %
Platelet Count: 192 10*3/uL (ref 150–400)
RBC: 4.91 MIL/uL (ref 4.22–5.81)
RDW: 14.3 % (ref 11.5–15.5)
WBC Count: 8.2 10*3/uL (ref 4.0–10.5)
nRBC: 0 % (ref 0.0–0.2)

## 2019-04-20 LAB — CMP (CANCER CENTER ONLY)
ALT: 106 U/L — ABNORMAL HIGH (ref 0–44)
AST: 46 U/L — ABNORMAL HIGH (ref 15–41)
Albumin: 4 g/dL (ref 3.5–5.0)
Alkaline Phosphatase: 79 U/L (ref 38–126)
Anion gap: 8 (ref 5–15)
BUN: 13 mg/dL (ref 6–20)
CO2: 23 mmol/L (ref 22–32)
Calcium: 9.1 mg/dL (ref 8.9–10.3)
Chloride: 108 mmol/L (ref 98–111)
Creatinine: 0.85 mg/dL (ref 0.61–1.24)
GFR, Est AFR Am: >60 mL/min (ref >60)
GFR, Estimated: >60 mL/min (ref >60)
Glucose, Bld: 116 mg/dL — ABNORMAL HIGH (ref 70–99)
Potassium: 4.1 mmol/L (ref 3.5–5.1)
Sodium: 139 mmol/L (ref 135–145)
Total Bilirubin: 0.5 mg/dL (ref 0.3–1.2)
Total Protein: 6.6 g/dL (ref 6.5–8.1)

## 2019-04-20 MED ORDER — SODIUM CHLORIDE 0.9 % IV SOLN
Freq: Once | INTRAVENOUS | Status: AC
  Administered 2019-04-20: 09:00:00 via INTRAVENOUS
  Filled 2019-04-20: qty 250

## 2019-04-20 MED ORDER — SODIUM CHLORIDE 0.9 % IV SOLN
10.0000 mg/kg | Freq: Once | INTRAVENOUS | Status: AC
  Administered 2019-04-20: 860 mg via INTRAVENOUS
  Filled 2019-04-20: qty 10

## 2019-04-20 NOTE — Patient Instructions (Signed)
Durvalumab injection What is this medicine? DURVALUMAB (dur VAL ue mab) is a monoclonal antibody. It is used to treat urothelial cancer and lung cancer. This medicine may be used for other purposes; ask your health care provider or pharmacist if you have questions. COMMON BRAND NAME(S): IMFINZI What should I tell my health care provider before I take this medicine? They need to know if you have any of these conditions:  diabetes  immune system problems  infection  inflammatory bowel disease  kidney disease  liver disease  lung or breathing disease  lupus  organ transplant  stomach or intestine problems  thyroid disease  an unusual or allergic reaction to durvalumab, other medicines, foods, dyes, or preservatives  pregnant or trying to get pregnant  breast-feeding How should I use this medicine? This medicine is for infusion into a vein. It is given by a health care professional in a hospital or clinic setting. A special MedGuide will be given to you before each treatment. Be sure to read this information carefully each time. Talk to your pediatrician regarding the use of this medicine in children. Special care may be needed. Overdosage: If you think you have taken too much of this medicine contact a poison control center or emergency room at once. NOTE: This medicine is only for you. Do not share this medicine with others. What if I miss a dose? It is important not to miss your dose. Call your doctor or health care professional if you are unable to keep an appointment. What may interact with this medicine? Interactions have not been studied. This list may not describe all possible interactions. Give your health care provider a list of all the medicines, herbs, non-prescription drugs, or dietary supplements you use. Also tell them if you smoke, drink alcohol, or use illegal drugs. Some items may interact with your medicine. What should I watch for while using this  medicine? This drug may make you feel generally unwell. Continue your course of treatment even though you feel ill unless your doctor tells you to stop. You may need blood work done while you are taking this medicine. Do not become pregnant while taking this medicine or for 3 months after stopping it. Women should inform their doctor if they wish to become pregnant or think they might be pregnant. There is a potential for serious side effects to an unborn child. Talk to your health care professional or pharmacist for more information. Do not breast-feed an infant while taking this medicine or for 3 months after stopping it. What side effects may I notice from receiving this medicine? Side effects that you should report to your doctor or health care professional as soon as possible:  allergic reactions like skin rash, itching or hives, swelling of the face, lips, or tongue  black, tarry stools  bloody or watery diarrhea  breathing problems  change in emotions or moods  change in sex drive  changes in vision  chest pain or chest tightness  chills  confusion  cough  facial flushing  fever  headache  signs and symptoms of high blood sugar such as dizziness; dry mouth; dry skin; fruity breath; nausea; stomach pain; increased hunger or thirst; increased urination  signs and symptoms of liver injury like dark yellow or brown urine; general ill feeling or flu-like symptoms; light-colored stools; loss of appetite; nausea; right upper belly pain; unusually weak or tired; yellowing of the eyes or skin  stomach pain  trouble passing urine or change in  the amount of urine  weight gain or weight loss Side effects that usually do not require medical attention (report these to your doctor or health care professional if they continue or are bothersome):  bone pain  constipation  loss of appetite  muscle pain  nausea  swelling of the ankles, feet, hands  tiredness This list  may not describe all possible side effects. Call your doctor for medical advice about side effects. You may report side effects to FDA at 1-800-FDA-1088. Where should I keep my medicine? This drug is given in a hospital or clinic and will not be stored at home. NOTE: This sheet is a summary. It may not cover all possible information. If you have questions about this medicine, talk to your doctor, pharmacist, or health care provider.  2020 Elsevier/Gold Standard (2016-09-29 19:25:04)

## 2019-04-20 NOTE — Progress Notes (Signed)
Per Dr. Julien Nordmann, patient is okay to receive treatment today with ALT 106.

## 2019-04-20 NOTE — Telephone Encounter (Signed)
Scheduled appt per 9/17 los - pt to get and updated schedue next visit

## 2019-04-20 NOTE — Progress Notes (Signed)
[facility] Telephone:(336) [phone]   Fax:(336) [phone]  OFFICE PROGRESS NOTE  [name], MD [address] [place] [address]  DIAGNOSIS: stage IIIB (T1c, N3, M0)non-small cell lung cancer, adenocarcinoma presented with right upper lobe lung mass in addition to mediastinal and bilateral supraclavicular lymphadenopathy diagnosed in January 2020. PD-L1: 10% Guardant 360 molecular studies showed no actionable mutation.  PRIOR THERAPY: Concurrent chemoradiation with weekly carboplatin for AUC of 2 and paclitaxel 45 mg/M2.  Status post 6 cycles.  Last dose was given on October 10, 2018 with stable disease.  CURRENT THERAPY:  Consolidation treatment with immunotherapy with Imfinzi 10 mg/KG every 2 weeks.  First dose November 17, 2018.  Status post 11 cycles.  INTERVAL HISTORY: Brian [name] 58 y.o. male returns to the clinic today for follow-up visit.  The patient is feeling fine today with no concerning complaints except for occasional spasm in the right side of the neck with the neck movement.  He denied having any chest pain, shortness of breath, cough or hemoptysis.  He denied having any fever or chills.  He has no nausea, vomiting, diarrhea or constipation.  He denied having any headache or visual changes.  He has no significant weight loss or night sweats.  He continues to tolerate his treatment with Imfinzi fairly well.  The patient had repeat CT scan of the neck and the chest performed recently and is here for evaluation and discussion of his scan results.  MEDICAL HISTORY: Past Medical History:  Diagnosis Date   Asthma    Cancer ([place])    Diabetes mellitus without complication ([place])    Hypertension    Subarachnoid hemorrhage ([place])     ALLERGIES:  has No Known Allergies.  MEDICATIONS:  Current Outpatient Medications  Medication Sig Dispense Refill   amLODipine (NORVASC) 5 MG tablet Take by mouth.     atorvastatin (LIPITOR) 20 MG tablet  Take by mouth.     metFORMIN (GLUCOPHAGE) 500 MG tablet Take by mouth.     prochlorperazine (COMPAZINE) 10 MG tablet Take 1 tablet (10 mg total) by mouth every 6 (six) hours as needed for nausea or vomiting. 30 tablet 0   Respiratory Therapy Supplies (FLUTTER) DEVI Please use up 10 times daily(4-5 breaths, 4-5 times daily) 1 each 0   venlafaxine (EFFEXOR) 37.5 MG tablet TAKE 1 TABLET ONCE DAILY.     No current facility-administered medications for this visit.     SURGICAL HISTORY:  Past Surgical History:  Procedure Laterality Date   LEG SURGERY  age 25    left leg, from fracture    REVIEW OF SYSTEMS:  Constitutional: negative Eyes: negative Ears, nose, mouth, throat, and face: negative Respiratory: negative Cardiovascular: negative Gastrointestinal: negative Genitourinary:negative Integument/breast: negative Hematologic/lymphatic: negative Musculoskeletal:negative Neurological: negative Behavioral/Psych: negative Endocrine: negative Allergic/Immunologic: negative   PHYSICAL EXAMINATION: General appearance: alert, cooperative and no distress Head: Normocephalic, without obvious abnormality, atraumatic Neck: no adenopathy, no JVD, supple, symmetrical, trachea midline and thyroid not enlarged, symmetric, no tenderness/mass/nodules Lymph nodes: Cervical, supraclavicular, and axillary nodes normal. Resp: clear to auscultation bilaterally Back: symmetric, no curvature. ROM normal. No CVA tenderness. Cardio: regular rate and rhythm, S1, S2 normal, no murmur, click, rub or gallop GI: soft, non-tender; bowel sounds normal; no masses,  no organomegaly Extremities: extremities normal, atraumatic, no cyanosis or edema Neurologic: Alert and oriented X 3, normal strength and tone. Normal symmetric reflexes. Normal coordination and gait  ECOG PERFORMANCE STATUS: 1 -  Symptomatic but completely ambulatory  Blood pressure 121/90, pulse 97, temperature 97.8 F (36.6 C), temperature  source Temporal, resp. rate 17, height _0  (1.727 m), weight 192 lb 8 oz (87.3 kg), SpO2 98 %.  LABORATORY DATA: Lab Results  Component Value Date   WBC 8.6 04/06/2019   HGB 14.4 04/06/2019   HCT 43.5 04/06/2019   MCV 87.5 04/06/2019   PLT 189 04/06/2019      Chemistry      Component Value Date/Time   NA 137 04/06/2019 1025   K 4.3 04/06/2019 1025   CL 106 04/06/2019 1025   CO2 24 04/06/2019 1025   BUN 15 04/06/2019 1025   CREATININE 0.77 04/06/2019 1025      Component Value Date/Time   CALCIUM 9.1 04/06/2019 1025   ALKPHOS 82 04/06/2019 1025   AST 33 04/06/2019 1025   ALT 79 (H) 04/06/2019 1025   BILITOT 0.3 04/06/2019 1025       RADIOGRAPHIC STUDIES: Ct Soft Tissue Neck W Contrast  Result Date: 04/19/2019 CLINICAL DATA:  Lung cancer restaging EXAM: CT NECK WITH CONTRAST TECHNIQUE: Multidetector CT imaging of the neck was performed using the standard protocol following the bolus administration of intravenous contrast. CONTRAST:  173m OMNIPAQUE IOHEXOL 300 MG/ML  SOLN COMPARISON:  02/06/2019 FINDINGS: Pharynx and larynx: No evidence of mass or inflammation. Salivary glands: No inflammation, mass, or stone. Thyroid: Normal. Lymph nodes: Cluster of enlarged lymph nodes in the right posterior triangle with heterogeneous enhancement attributed to necrosis/treatment. The nodes individually measure up to 13 mm long axis, stable from prior. Cluster of mildly rounded lymph nodes along the lower right jugular chain measuring up to 7 mm, more numerous than seen on prior. No contralateral adenopathy. Vascular: Negative Limited intracranial: Negative Visualized orbits: Negative Mastoids and visualized paranasal sinuses: Clear Skeleton: Mild facet spurring. Upper chest: Reported separately IMPRESSION: 1. Stable right posterior triangle lymphadenopathy. 2. Cluster of small but rounded and suspicious lymph nodes along the lower right jugular chain with mild increase from July. Electronically  Signed   By: JMonte FantasiaM.D.   On: 04/19/2019 10:11   Ct Chest W Contrast  Result Date: 04/19/2019 CLINICAL DATA:  Restaging lung cancer. Non-small cell adenocarcinoma right lung. Stage III with ongoing immunotherapy/chemotherapy. EXAM: CT CHEST WITH CONTRAST TECHNIQUE: Multidetector CT imaging of the chest was performed during intravenous contrast administration. CONTRAST:  1045mOMNIPAQUE IOHEXOL 300 MG/ML  SOLN COMPARISON:  01/24/2019 FINDINGS: Cardiovascular: Heart is normal size. Subtle calcified plaque over the left anterior descending coronary artery. Thoracic aorta and pulmonary arterial system are unremarkable. Mediastinum/Nodes: No significant mediastinal or hilar adenopathy. Decrease in size of previously seen 1.1 cm paratracheal lymph node now measuring 6 mm. Previously seen 1 cm right hilar lymph node no longer evident. Continued decrease in size and left neck base/supraclavicular lymph node now measuring 6 mm (previously 1.1 cm). Continued decrease in size of subcentimeter right neck base/supraclavicular lymph nodes. No axillary adenopathy. Lungs/Pleura: Lungs are adequately inflated. Moderately decreased right perihilar/suprahilar consolidation compatible with improving post radiation changes patient's known right upper lobe lung cancer is not visualized and may be partially obscured by this residual consolidation. There is mild nodularity along the posterior superior and posteroinferior aspect of this consolidation. No effusion. Left lung is clear. Airways are unremarkable. Upper Abdomen: No acute findings.  No mass or adenopathy. Musculoskeletal: No focal lytic or sclerotic lesions. IMPRESSION: 1. Moderate interval improvement in post treatment consolidation over the right perihilar/suprahilar region which continues to obscure patient's  known right upper lobe lung cancer. 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 2. Continued interval decrease in size of bilateral neck base/supraclavicular, mediastinal and right hilar nodes as described. No new adenopathy. 3.  Subtle atherosclerotic coronary artery disease. Electronically Signed   By: Marin Olp M.D.   On: 04/19/2019 10:16    ASSESSMENT AND PLAN: This is a very pleasant 58 years old white male with a stage IIIb non-small cell lung cancer, adenocarcinoma diagnosed in January 2020 with PDL 1 expression of 10% and no actionable mutations on Guardant 360 blood test. The patient underwent a course of concurrent chemoradiation with weekly carboplatin and paclitaxel status post 6 cycles.  He tolerated this course fairly well except for fatigue and odynophagia which significantly improved. The patient is currently undergoing consolidation treatment with Imfinzi 10 mg/KG every 2 weeks status post 11 cycles.  His restaging scan after cycle #6 showed new cluster of mildly enlarged lymph nodes in the right posterior triangle of the neck.  The patient is also undergoing palliative radiotherapy to this area under the care of Dr. Sondra Come. The patient has been tolerating his systemic treatment with immunotherapy fairly well. He had repeat CT scan of the neck and chest performed recently.  I personally and independently reviewed the scans and discussed the results with the patient today.  His scan of the chest showed stable to improvement of his disease with minor changes in the scan of the neck and slight increase in some of the lymph nodes. I recommended for the patient to continue his current treatment with Imfinzi and he will proceed with cycle #12 today. I will see him back for follow-up visit in 2 weeks for evaluation before the next cycle of his treatment. The patient was advised to call immediately if he has any concerning symptoms in the  interval. The patient voices understanding of current disease status and treatment options and is in agreement with the current care plan. All questions were answered. The patient knows to call the clinic with any problems, questions or concerns. We can certainly see the patient much sooner if necessary.  Disclaimer: This note was dictated with voice recognition software. Similar sounding words can inadvertently be transcribed and may not be corrected upon review.

## 2019-05-04 ENCOUNTER — Other Ambulatory Visit: Payer: Self-pay

## 2019-05-04 ENCOUNTER — Inpatient Hospital Stay (HOSPITAL_BASED_OUTPATIENT_CLINIC_OR_DEPARTMENT_OTHER): Payer: BC Managed Care – PPO | Admitting: Internal Medicine

## 2019-05-04 ENCOUNTER — Inpatient Hospital Stay: Payer: BC Managed Care – PPO

## 2019-05-04 ENCOUNTER — Inpatient Hospital Stay: Payer: BC Managed Care – PPO | Attending: Internal Medicine

## 2019-05-04 ENCOUNTER — Encounter: Payer: Self-pay | Admitting: Internal Medicine

## 2019-05-04 VITALS — BP 115/78 | HR 92 | Temp 98.3°F | Resp 18 | Ht 68.0 in | Wt 196.7 lb

## 2019-05-04 DIAGNOSIS — Z7984 Long term (current) use of oral hypoglycemic drugs: Secondary | ICD-10-CM | POA: Insufficient documentation

## 2019-05-04 DIAGNOSIS — C770 Secondary and unspecified malignant neoplasm of lymph nodes of head, face and neck: Secondary | ICD-10-CM | POA: Diagnosis not present

## 2019-05-04 DIAGNOSIS — R1310 Dysphagia, unspecified: Secondary | ICD-10-CM | POA: Insufficient documentation

## 2019-05-04 DIAGNOSIS — Z5112 Encounter for antineoplastic immunotherapy: Secondary | ICD-10-CM

## 2019-05-04 DIAGNOSIS — E119 Type 2 diabetes mellitus without complications: Secondary | ICD-10-CM | POA: Diagnosis not present

## 2019-05-04 DIAGNOSIS — I10 Essential (primary) hypertension: Secondary | ICD-10-CM | POA: Diagnosis not present

## 2019-05-04 DIAGNOSIS — Z79899 Other long term (current) drug therapy: Secondary | ICD-10-CM | POA: Diagnosis not present

## 2019-05-04 DIAGNOSIS — C3491 Malignant neoplasm of unspecified part of right bronchus or lung: Secondary | ICD-10-CM

## 2019-05-04 DIAGNOSIS — J45909 Unspecified asthma, uncomplicated: Secondary | ICD-10-CM | POA: Diagnosis not present

## 2019-05-04 DIAGNOSIS — Z923 Personal history of irradiation: Secondary | ICD-10-CM | POA: Insufficient documentation

## 2019-05-04 DIAGNOSIS — C3411 Malignant neoplasm of upper lobe, right bronchus or lung: Secondary | ICD-10-CM | POA: Diagnosis not present

## 2019-05-04 LAB — CMP (CANCER CENTER ONLY)
ALT: 56 U/L — ABNORMAL HIGH (ref 0–44)
AST: 27 U/L (ref 15–41)
Albumin: 4 g/dL (ref 3.5–5.0)
Alkaline Phosphatase: 76 U/L (ref 38–126)
Anion gap: 9 (ref 5–15)
BUN: 16 mg/dL (ref 6–20)
CO2: 22 mmol/L (ref 22–32)
Calcium: 8.7 mg/dL — ABNORMAL LOW (ref 8.9–10.3)
Chloride: 107 mmol/L (ref 98–111)
Creatinine: 0.84 mg/dL (ref 0.61–1.24)
GFR, Est AFR Am: >60 mL/min (ref >60)
GFR, Estimated: >60 mL/min (ref >60)
Glucose, Bld: 103 mg/dL — ABNORMAL HIGH (ref 70–99)
Potassium: 4.3 mmol/L (ref 3.5–5.1)
Sodium: 138 mmol/L (ref 135–145)
Total Bilirubin: 0.3 mg/dL (ref 0.3–1.2)
Total Protein: 6.5 g/dL (ref 6.5–8.1)

## 2019-05-04 LAB — CBC WITH DIFFERENTIAL (CANCER CENTER ONLY)
Abs Immature Granulocytes: 0.03 10*3/uL (ref 0.00–0.07)
Basophils Absolute: 0.1 10*3/uL (ref 0.0–0.1)
Basophils Relative: 1 %
Eosinophils Absolute: 0.2 10*3/uL (ref 0.0–0.5)
Eosinophils Relative: 3 %
HCT: 41.9 % (ref 39.0–52.0)
Hemoglobin: 13.9 g/dL (ref 13.0–17.0)
Immature Granulocytes: 0 %
Lymphocytes Relative: 8 %
Lymphs Abs: 0.6 10*3/uL — ABNORMAL LOW (ref 0.7–4.0)
MCH: 29.4 pg (ref 26.0–34.0)
MCHC: 33.2 g/dL (ref 30.0–36.0)
MCV: 88.8 fL (ref 80.0–100.0)
Monocytes Absolute: 1.2 10*3/uL — ABNORMAL HIGH (ref 0.1–1.0)
Monocytes Relative: 17 %
Neutro Abs: 5.2 10*3/uL (ref 1.7–7.7)
Neutrophils Relative %: 71 %
Platelet Count: 188 10*3/uL (ref 150–400)
RBC: 4.72 MIL/uL (ref 4.22–5.81)
RDW: 14.1 % (ref 11.5–15.5)
WBC Count: 7.2 10*3/uL (ref 4.0–10.5)
nRBC: 0 % (ref 0.0–0.2)

## 2019-05-04 LAB — TSH: TSH: 4.596 u[IU]/mL — ABNORMAL HIGH (ref 0.320–4.118)

## 2019-05-04 MED ORDER — SODIUM CHLORIDE 0.9 % IV SOLN
10.0000 mg/kg | Freq: Once | INTRAVENOUS | Status: AC
  Administered 2019-05-04: 860 mg via INTRAVENOUS
  Filled 2019-05-04: qty 7.2

## 2019-05-04 MED ORDER — SODIUM CHLORIDE 0.9 % IV SOLN
Freq: Once | INTRAVENOUS | Status: AC
  Administered 2019-05-04: 12:00:00 via INTRAVENOUS
  Filled 2019-05-04: qty 250

## 2019-05-04 NOTE — Patient Instructions (Signed)
[facility] Discharge Instructions for Patients Receiving Chemotherapy  Today you received the following chemotherapy agents: Imfinzi.  To help prevent nausea and vomiting after your treatment, we encourage you to take your nausea medication as directed.   If you develop nausea and vomiting that is not controlled by your nausea medication, call the clinic.   BELOW ARE SYMPTOMS THAT SHOULD BE REPORTED IMMEDIATELY:  *FEVER GREATER THAN 100.5 F  *CHILLS WITH OR WITHOUT FEVER  NAUSEA AND VOMITING THAT IS NOT CONTROLLED WITH YOUR NAUSEA MEDICATION  *UNUSUAL SHORTNESS OF BREATH  *UNUSUAL BRUISING OR BLEEDING  TENDERNESS IN MOUTH AND THROAT WITH OR WITHOUT PRESENCE OF ULCERS  *URINARY PROBLEMS  *BOWEL PROBLEMS  UNUSUAL RASH Items with * indicate a potential emergency and should be followed up as soon as possible.  Feel free to call the clinic should you have any questions or concerns. The clinic phone number is [phone].  Please show the CHEMO ALERT CARD at check-in to the Emergency Department and triage nurse.   

## 2019-05-04 NOTE — Progress Notes (Signed)
[place] Telephone:(336) [phone]   Fax:(336) [phone]  OFFICE PROGRESS NOTE  Brian Broker, MD [address] [place] [address]  DIAGNOSIS: stage IIIB (T1c, N3, M0)non-small cell lung cancer, adenocarcinoma presented with right upper lobe lung mass in addition to mediastinal and bilateral supraclavicular lymphadenopathy diagnosed in January 2020. PD-L1: 10% Guardant 360 molecular studies showed no actionable mutation.  PRIOR THERAPY: Concurrent chemoradiation with weekly carboplatin for AUC of 2 and paclitaxel 45 mg/M2.  Status post 6 cycles.  Last dose was given on October 10, 2018 with stable disease.  CURRENT THERAPY:  Consolidation treatment with immunotherapy with Imfinzi 10 mg/KG every 2 weeks.  First dose November 17, 2018.  Status post 12 cycles.  INTERVAL HISTORY: Brian [name] 58 y.o. male returns to the clinic today for follow-up visit.  The patient is feeling fine today with no concerning complaints.  He denied having any nausea, vomiting, diarrhea or constipation.  He has no abdominal pain.  He denied having any chest pain, shortness of breath, cough or hemoptysis.  He has no headache or visual changes.  He has no recent weight loss or night sweats.  The patient is here today for evaluation before starting cycle #13 of his treatment.   MEDICAL HISTORY: Past Medical History:  Diagnosis Date  . Asthma   . Cancer ([place])   . Diabetes mellitus without complication ([place])   . Hypertension   . Subarachnoid hemorrhage ([facility])     ALLERGIES:  has No Known Allergies.  MEDICATIONS:  Current Outpatient Medications  Medication Sig Dispense Refill  . amLODipine (NORVASC) 5 MG tablet Take by mouth.    [place] Kitchen atorvastatin (LIPITOR) 20 MG tablet Take by mouth.    . metFORMIN (GLUCOPHAGE) 500 MG tablet Take by mouth.    . prochlorperazine (COMPAZINE) 10 MG tablet Take 1 tablet (10 mg total) by mouth every 6 (six) hours as needed for nausea or vomiting. 30  tablet 0  . Respiratory Therapy Supplies (FLUTTER) DEVI Please use up 10 times daily(4-5 breaths, 4-5 times daily) 1 each 0  . venlafaxine (EFFEXOR) 37.5 MG tablet TAKE 1 TABLET ONCE DAILY.     No current facility-administered medications for this visit.     SURGICAL HISTORY:  Past Surgical History:  Procedure Laterality Date  . LEG SURGERY  age 27    left leg, from fracture    REVIEW OF SYSTEMS:  A comprehensive review of systems was negative.   PHYSICAL EXAMINATION: General appearance: alert, cooperative, fatigued and no distress Head: Normocephalic, without obvious abnormality, atraumatic Neck: no adenopathy, no JVD, supple, symmetrical, trachea midline and thyroid not enlarged, symmetric, no tenderness/mass/nodules Lymph nodes: Cervical, supraclavicular, and axillary nodes normal. Resp: clear to auscultation bilaterally Back: symmetric, no curvature. ROM normal. No CVA tenderness. Cardio: regular rate and rhythm, S1, S2 normal, no murmur, click, rub or gallop GI: soft, non-tender; bowel sounds normal; no masses,  no organomegaly Extremities: extremities normal, atraumatic, no cyanosis or edema  ECOG PERFORMANCE STATUS: 1 - Symptomatic but completely ambulatory  Blood pressure 115/78, pulse 92, temperature 98.3 F (36.8 C), temperature source Temporal, resp. rate 18, height _0  (1.727 m), weight 196 lb 11.2 oz (89.2 kg), SpO2 98 %.  LABORATORY DATA: Lab Results  Component Value Date   WBC 7.2 05/04/2019   HGB 13.9 05/04/2019   HCT 41.9 05/04/2019   MCV 88.8 05/04/2019   PLT 188 05/04/2019      Chemistry  Component Value Date/Time   NA 138 05/04/2019 1018   K 4.3 05/04/2019 1018   CL 107 05/04/2019 1018   CO2 22 05/04/2019 1018   BUN 16 05/04/2019 1018   CREATININE 0.84 05/04/2019 1018      Component Value Date/Time   CALCIUM 8.7 (L) 05/04/2019 1018   ALKPHOS 76 05/04/2019 1018   AST 27 05/04/2019 1018   ALT 56 (H) 05/04/2019 1018   BILITOT 0.3  05/04/2019 1018       RADIOGRAPHIC STUDIES: Ct Soft Tissue Neck W Contrast  Result Date: 04/19/2019 CLINICAL DATA:  Lung cancer restaging EXAM: CT NECK WITH CONTRAST TECHNIQUE: Multidetector CT imaging of the neck was performed using the standard protocol following the bolus administration of intravenous contrast. CONTRAST:  146m OMNIPAQUE IOHEXOL 300 MG/ML  SOLN COMPARISON:  02/06/2019 FINDINGS: Pharynx and larynx: No evidence of mass or inflammation. Salivary glands: No inflammation, mass, or stone. Thyroid: Normal. Lymph nodes: Cluster of enlarged lymph nodes in the right posterior triangle with heterogeneous enhancement attributed to necrosis/treatment. The nodes individually measure up to 13 mm long axis, stable from prior. Cluster of mildly rounded lymph nodes along the lower right jugular chain measuring up to 7 mm, more numerous than seen on prior. No contralateral adenopathy. Vascular: Negative Limited intracranial: Negative Visualized orbits: Negative Mastoids and visualized paranasal sinuses: Clear Skeleton: Mild facet spurring. Upper chest: Reported separately IMPRESSION: 1. Stable right posterior triangle lymphadenopathy. 2. Cluster of small but rounded and suspicious lymph nodes along the lower right jugular chain with mild increase from July. Electronically Signed   By: JMonte FantasiaM.D.   On: 04/19/2019 10:11   Ct Chest W Contrast  Result Date: 04/19/2019 CLINICAL DATA:  Restaging lung cancer. Non-small cell adenocarcinoma right lung. Stage III with ongoing immunotherapy/chemotherapy. EXAM: CT CHEST WITH CONTRAST TECHNIQUE: Multidetector CT imaging of the chest was performed during intravenous contrast administration. CONTRAST:  1077mOMNIPAQUE IOHEXOL 300 MG/ML  SOLN COMPARISON:  01/24/2019 FINDINGS: Cardiovascular: Heart is normal size. Subtle calcified plaque over the left anterior descending coronary artery. Thoracic aorta and pulmonary arterial system are unremarkable.  Mediastinum/Nodes: No significant mediastinal or hilar adenopathy. Decrease in size of previously seen 1.1 cm paratracheal lymph node now measuring 6 mm. Previously seen 1 cm right hilar lymph node no longer evident. Continued decrease in size and left neck base/supraclavicular lymph node now measuring 6 mm (previously 1.1 cm). Continued decrease in size of subcentimeter right neck base/supraclavicular lymph nodes. No axillary adenopathy. Lungs/Pleura: Lungs are adequately inflated. Moderately decreased right perihilar/suprahilar consolidation compatible with improving post radiation changes patient's known right upper lobe lung cancer is not visualized and may be partially obscured by this residual consolidation. There is mild nodularity along the posterior superior and posteroinferior aspect of this consolidation. No effusion. Left lung is clear. Airways are unremarkable. Upper Abdomen: No acute findings.  No mass or adenopathy. Musculoskeletal: No focal lytic or sclerotic lesions. IMPRESSION: 1. Moderate interval improvement in post treatment consolidation over the right perihilar/suprahilar region which continues to obscure patient's known right upper lobe lung cancer. 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 2. Continued interval decrease in size of bilateral neck base/supraclavicular, mediastinal and right hilar nodes as described. No new adenopathy. 3.  Subtle atherosclerotic coronary artery disease. Electronically Signed   By: DaMarin Olp.D.   On:  04/19/2019 10:16    ASSESSMENT AND PLAN: This is a very pleasant 58 years old white male with a stage IIIb non-small cell lung cancer, adenocarcinoma diagnosed in January 2020 with PDL 1 expression of 10% and no actionable mutations on Guardant 360 blood test.  The patient underwent a course of concurrent chemoradiation with weekly carboplatin and paclitaxel status post 6 cycles.  He tolerated this course fairly well except for fatigue and odynophagia which significantly improved. The patient is currently undergoing consolidation treatment with Imfinzi 10 mg/KG every 2 weeks status post 12 cycles.  His restaging scan after cycle #6 showed new cluster of mildly enlarged lymph nodes in the right posterior triangle of the neck.  The patient is also undergoing palliative radiotherapy to this area under the care of Dr. Sondra Come. He continues to tolerate his treatment with immunotherapy fairly well with no significant adverse effects. I recommended for him to proceed with cycle #13 today as planned. I will see him back for follow-up visit in 2 weeks for evaluation before the next cycle of his treatment. He was advised to call immediately if he has any concerning symptoms in the interval. The patient voices understanding of current disease status and treatment options and is in agreement with the current care plan. All questions were answered. The patient knows to call the clinic with any problems, questions or concerns. We can certainly see the patient much sooner if necessary.  Disclaimer: This note was dictated with voice recognition software. Similar sounding words can inadvertently be transcribed and may not be corrected upon review.

## 2019-05-10 NOTE — Progress Notes (Signed)
  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 He reported some resistance with swallowing and moderate fatigue. He denied pain, nausea or vomiting, and taste changes throughout. The enlarged lymph node was noted to decrease in size as treatments progressed.  Plan: The patient will follow-up with radiation oncology in 1 month.  ________________________________________________   Blair Promise, PhD, MD  This document serves as a record of services personally performed by Gery Pray, MD. It was created on his behalf by Wilburn Mylar, a trained medical scribe. The creation of this record is based on the scribe's personal observations and the provider's statements to them. This document has been checked and approved by the attending provider.

## 2019-05-10 NOTE — Progress Notes (Signed)
Radiation Oncology         [phone] ________________________________  Name: Brian [name] MRN: [id number]  Date: 05/11/2019  DOB: 10-Jul-1961  Follow-Up Visit Note  CC: [name], MD  [name], MD    ICD-10-CM   1. Adenocarcinoma of right lung, stage 3 (HCC)  C34.91     Diagnosis:   Stage IIIB (T1c, N3, M0)non-small cell lung cancer, adenocarcinoma presented with right upper lobe lung mass in addition to mediastinal and bilateral supraclavicular lymphadenopathy diagnosed in January 2020.  Interval Since Last Radiation:  1 month  03/02/2019 through 04/13/2019  Site Technique Total Dose Dose per Fx Completed Fx Beam Energies  Head & neck: HN_Rt IMRT 60/60 2 30/30 6X   Narrative:  The patient returns today for routine follow-up. He last saw Dr. Julien Nordmann on 05/04/2019 and continues on immunotherapy with Imfinzi.    Since completion, he underwent restaging CT scans on 04/18/2019. Chest CT showed: moderate interval improvement in post treatment consolidation over the right perihilar/suprahilar region; continued interval decrease in size of bilateral neck base/supraclavicular, mediastinal, and right hilar nodes, with no new adenopathy.  Neck CT showed: stable right posterior triangle lymphadenopathy; cluster of small but rounded and suspicious lymph nodes along the lower right jugular chain with mild increase from July.  On review of systems, he reports ongoing mild to moderate fatigue, occasional productive cought with green/yellow sputum, and shortness of breath with exertion, especially walking uphill. He denies pain, hemoptysis, difficulty swallowing, and any taste changes.  ALLERGIES:  has No Known Allergies.  Meds: Current Outpatient Medications  Medication Sig Dispense Refill   amLODipine (NORVASC) 5 MG tablet Take by mouth.     atorvastatin (LIPITOR) 20 MG tablet Take by mouth.     metFORMIN (GLUCOPHAGE) 500 MG tablet Take by mouth.     prochlorperazine  (COMPAZINE) 10 MG tablet Take 1 tablet (10 mg total) by mouth every 6 (six) hours as needed for nausea or vomiting. 30 tablet 0   Respiratory Therapy Supplies (FLUTTER) DEVI Please use up 10 times daily(4-5 breaths, 4-5 times daily) 1 each 0   venlafaxine (EFFEXOR) 37.5 MG tablet TAKE 1 TABLET ONCE DAILY.     No current facility-administered medications for this encounter.     Physical Findings: The patient is in no acute distress. Patient is alert and oriented.  height is 5\' 8"  (1.727 m) and weight is 193 lb 8 oz (87.8 kg). His temporal temperature is 98.3 F (36.8 C). His blood pressure is 117/80 and his pulse is 96. His respiration is 18 and oxygen saturation is 99%. .  No significant changes. Lungs are clear to auscultation bilaterally. Heart has regular rate and rhythm. No palpable cervical, supraclavicular, or axillary adenopathy. Abdomen soft, non-tender, normal bowel sounds.  Patient skin along the right lower neck area has healed well.  Mild hyperpigmentation changes noted.  Patient continues have a palpable approximately 1 cm lymph node in the right lower posterior neck region.   Lab Findings: Lab Results  Component Value Date   WBC 7.2 05/04/2019   HGB 13.9 05/04/2019   HCT 41.9 05/04/2019   MCV 88.8 05/04/2019   PLT 188 05/04/2019    Radiographic Findings: Ct Soft Tissue Neck W Contrast  Result Date: 04/19/2019 CLINICAL DATA:  Lung cancer restaging EXAM: CT NECK WITH CONTRAST TECHNIQUE: Multidetector CT imaging of the neck was performed using the standard protocol following the bolus administration of intravenous contrast. CONTRAST:  171mL OMNIPAQUE IOHEXOL 300 MG/ML  SOLN COMPARISON:  02/06/2019 FINDINGS: Pharynx and larynx: No evidence of mass or inflammation. Salivary glands: No inflammation, mass, or stone. Thyroid: Normal. Lymph nodes: Cluster of enlarged lymph nodes in the right posterior triangle with heterogeneous enhancement attributed to necrosis/treatment. The nodes  individually measure up to 13 mm long axis, stable from prior. Cluster of mildly rounded lymph nodes along the lower right jugular chain measuring up to 7 mm, more numerous than seen on prior. No contralateral adenopathy. Vascular: Negative Limited intracranial: Negative Visualized orbits: Negative Mastoids and visualized paranasal sinuses: Clear Skeleton: Mild facet spurring. Upper chest: Reported separately IMPRESSION: 1. Stable right posterior triangle lymphadenopathy. 2. Cluster of small but rounded and suspicious lymph nodes along the lower right jugular chain with mild increase from July. Electronically Signed   By: Monte Fantasia M.D.   On: 04/19/2019 10:11   Ct Chest W Contrast  Result Date: 04/19/2019 CLINICAL DATA:  Restaging lung cancer. Non-small cell adenocarcinoma right lung. Stage III with ongoing immunotherapy/chemotherapy. EXAM: CT CHEST WITH CONTRAST TECHNIQUE: Multidetector CT imaging of the chest was performed during intravenous contrast administration. CONTRAST:  15mL OMNIPAQUE IOHEXOL 300 MG/ML  SOLN COMPARISON:  01/24/2019 FINDINGS: Cardiovascular: Heart is normal size. Subtle calcified plaque over the left anterior descending coronary artery. Thoracic aorta and pulmonary arterial system are unremarkable. Mediastinum/Nodes: No significant mediastinal or hilar adenopathy. Decrease in size of previously seen 1.1 cm paratracheal lymph node now measuring 6 mm. Previously seen 1 cm right hilar lymph node no longer evident. Continued decrease in size and left neck base/supraclavicular lymph node now measuring 6 mm (previously 1.1 cm). Continued decrease in size of subcentimeter right neck base/supraclavicular lymph nodes. No axillary adenopathy. Lungs/Pleura: Lungs are adequately inflated. Moderately decreased right perihilar/suprahilar consolidation compatible with improving post radiation changes patient's known right upper lobe lung cancer is not visualized and may be partially obscured by  this residual consolidation. There is mild nodularity along the posterior superior and posteroinferior aspect of this consolidation. No effusion. Left lung is clear. Airways are unremarkable. Upper Abdomen: No acute findings.  No mass or adenopathy. Musculoskeletal: No focal lytic or sclerotic lesions. IMPRESSION: 1. Moderate interval improvement in post treatment consolidation over the right perihilar/suprahilar region which continues to obscure patient's known right upper lobe lung cancer. 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 2. Continued interval decrease in size of bilateral neck base/supraclavicular, mediastinal and right hilar nodes as described. No new adenopathy. 3.  Subtle atherosclerotic coronary artery disease. Electronically Signed   By: Marin Olp M.D.   On: 04/19/2019 10:16    Impression:  The patient is recovering from the effects of radiation.  Patient will continue on immunotherapy.  Recommend the patient continue to monitor the palpable nodule in the right lower posterior neck.   Plan: Follow-up in month to assess nodule in the right lower neck region.  ____________________________________ Gery Pray, MD   This document serves as a record of services personally performed by Gery Pray, MD. It was created on his behalf by Wilburn Mylar, a trained medical scribe. The creation of this record is based on the scribe's personal observations and the provider's statements to them. This document has been checked and approved by the attending provider.

## 2019-05-11 ENCOUNTER — Ambulatory Visit
Admission: RE | Admit: 2019-05-11 | Discharge: 2019-05-11 | Disposition: A | Discharge status: Home / Self Care | Payer: BC Managed Care – PPO | Source: Ambulatory Visit | Attending: Radiation Oncology | Admitting: Radiation Oncology

## 2019-05-11 ENCOUNTER — Encounter: Payer: Self-pay | Admitting: Radiation Oncology

## 2019-05-11 ENCOUNTER — Other Ambulatory Visit: Payer: Self-pay

## 2019-05-11 VITALS — BP 117/80 | HR 96 | Temp 98.3°F | Resp 18 | Ht 68.0 in | Wt 193.5 lb

## 2019-05-11 DIAGNOSIS — C3411 Malignant neoplasm of upper lobe, right bronchus or lung: Secondary | ICD-10-CM | POA: Insufficient documentation

## 2019-05-11 DIAGNOSIS — R591 Generalized enlarged lymph nodes: Secondary | ICD-10-CM | POA: Diagnosis not present

## 2019-05-11 DIAGNOSIS — Z923 Personal history of irradiation: Secondary | ICD-10-CM | POA: Diagnosis not present

## 2019-05-11 DIAGNOSIS — Z79899 Other long term (current) drug therapy: Secondary | ICD-10-CM | POA: Insufficient documentation

## 2019-05-11 DIAGNOSIS — Z7984 Long term (current) use of oral hypoglycemic drugs: Secondary | ICD-10-CM | POA: Diagnosis not present

## 2019-05-11 DIAGNOSIS — I2510 Atherosclerotic heart disease of native coronary artery without angina pectoris: Secondary | ICD-10-CM | POA: Diagnosis not present

## 2019-05-11 DIAGNOSIS — C3491 Malignant neoplasm of unspecified part of right bronchus or lung: Secondary | ICD-10-CM

## 2019-05-11 NOTE — Progress Notes (Signed)
Pt presents today for f/u with Dr. Sondra Come. Pt denies c/o pain. Pt reports ongoing fatigue, rated mild to moderate. Pt reports occasional cough with greenish yellow sputum. Pt denies hemoptysis. Pt reports SOB with exertion, walking uphill. Pt denies difficulty swallowing. Pt denies any taste changes.   BP 117/80 (BP Location: Left Arm, Patient Position: Sitting)   Pulse 96   Temp 98.3 F (36.8 C) (Temporal)   Resp 18   Ht 5\' 8"  (1.727 m)   Wt 193 lb 8 oz (87.8 kg)   SpO2 99%   BMI 29.42 kg/m   Wt Readings from Last 3 Encounters:  05/11/19 193 lb 8 oz (87.8 kg)  05/04/19 196 lb 11.2 oz (89.2 kg)  04/20/19 192 lb 8 oz (87.3 kg)   Loma Sousa, RN BSN

## 2019-05-11 NOTE — Patient Instructions (Signed)
Coronavirus (COVID-19) Are you at risk?  Are you at risk for the Coronavirus (COVID-19)?  To be considered HIGH RISK for Coronavirus (COVID-19), you have to meet the following criteria:  . Traveled to [place], [place], [place], [place] or [place]; or in the [place] to [place], [place], [place], or [place]; and have fever, cough, and shortness of breath within the last 2 weeks of travel OR . Been in close contact with a person diagnosed with COVID-19 within the last 2 weeks and have fever, cough, and shortness of breath . IF YOU DO NOT MEET THESE CRITERIA, YOU ARE CONSIDERED LOW RISK FOR COVID-19.  What to do if you are HIGH RISK for COVID-19?  . If you are having a medical emergency, call 911. . Seek medical care right away. Before you go to a doctor's office, urgent care or emergency department, call ahead and tell them about your recent travel, contact with someone diagnosed with COVID-19, and your symptoms. You should receive instructions from your physician's office regarding next steps of care.  . When you arrive at healthcare provider, tell the healthcare staff immediately you have returned from visiting [place], [place], [place], [place] or [place]; or traveled in the [place] to [place], [place], [place], or [place]; in the last two weeks or you have been in close contact with a person diagnosed with COVID-19 in the last 2 weeks.   . Tell the health care staff about your symptoms: fever, cough and shortness of breath. . After you have been seen by a medical provider, you will be either: o Tested for (COVID-19) and discharged home on quarantine except to seek medical care if symptoms worsen, and asked to  - Stay home and avoid contact with others until you get your results (4-5 days)  - Avoid travel on public transportation if possible (such as bus, train, or airplane) or o Sent to the Emergency Department by EMS for evaluation, COVID-19 testing, and possible  admission depending on your condition and test results.  What to do if you are LOW RISK for COVID-19?  Reduce your risk of any infection by using the same precautions used for avoiding the common cold or flu:  . Wash your hands often with soap and warm water for at least 20 seconds.  If soap and water are not readily available, use an alcohol-based hand sanitizer with at least 60% alcohol.  . If coughing or sneezing, cover your mouth and nose by coughing or sneezing into the elbow areas of your shirt or coat, into a tissue or into your sleeve (not your hands). . Avoid shaking hands with others and consider head nods or verbal greetings only. . Avoid touching your eyes, nose, or mouth with unwashed hands.  . Avoid close contact with people who are sick. . Avoid places or events with large numbers of people in one location, like concerts or sporting events. . Carefully consider travel plans you have or are making. . If you are planning any travel outside or inside the [place], visit the CDC's Travelers' Health webpage for the latest health notices. . If you have some symptoms but not all symptoms, continue to monitor at home and seek medical attention if your symptoms worsen. . If you are having a medical emergency, call 911.   ADDITIONAL HEALTHCARE OPTIONS FOR PATIENTS  [facility] / e-Visit: [email or web address]         [facility]: [phone]  [facility]: [phone]                   [facility]: [phone]   

## 2019-05-12 ENCOUNTER — Telehealth: Payer: Self-pay | Admitting: *Deleted

## 2019-05-12 NOTE — Telephone Encounter (Signed)
CALLED PATIENT TO INFORM OF FU WITH DR, Sondra Come ON 06-19-19 @ 10:30 AM, SPOKE WITH PATIENT AND HE AGREED TO THIS APPT. DATE AND TIME

## 2019-05-18 ENCOUNTER — Inpatient Hospital Stay: Payer: BC Managed Care – PPO

## 2019-05-18 ENCOUNTER — Inpatient Hospital Stay (HOSPITAL_BASED_OUTPATIENT_CLINIC_OR_DEPARTMENT_OTHER): Payer: BC Managed Care – PPO | Admitting: Physician Assistant

## 2019-05-18 ENCOUNTER — Other Ambulatory Visit: Payer: Self-pay

## 2019-05-18 ENCOUNTER — Telehealth: Payer: Self-pay | Admitting: Internal Medicine

## 2019-05-18 VITALS — BP 117/93 | HR 96 | Temp 98.0°F | Resp 17 | Ht 68.0 in | Wt 192.7 lb

## 2019-05-18 DIAGNOSIS — C3411 Malignant neoplasm of upper lobe, right bronchus or lung: Secondary | ICD-10-CM | POA: Diagnosis not present

## 2019-05-18 DIAGNOSIS — R1310 Dysphagia, unspecified: Secondary | ICD-10-CM | POA: Diagnosis not present

## 2019-05-18 DIAGNOSIS — Z5112 Encounter for antineoplastic immunotherapy: Secondary | ICD-10-CM | POA: Diagnosis not present

## 2019-05-18 DIAGNOSIS — C3491 Malignant neoplasm of unspecified part of right bronchus or lung: Secondary | ICD-10-CM

## 2019-05-18 DIAGNOSIS — Z923 Personal history of irradiation: Secondary | ICD-10-CM | POA: Diagnosis not present

## 2019-05-18 DIAGNOSIS — Z7984 Long term (current) use of oral hypoglycemic drugs: Secondary | ICD-10-CM | POA: Diagnosis not present

## 2019-05-18 DIAGNOSIS — C770 Secondary and unspecified malignant neoplasm of lymph nodes of head, face and neck: Secondary | ICD-10-CM | POA: Diagnosis not present

## 2019-05-18 DIAGNOSIS — Z79899 Other long term (current) drug therapy: Secondary | ICD-10-CM | POA: Diagnosis not present

## 2019-05-18 DIAGNOSIS — J45909 Unspecified asthma, uncomplicated: Secondary | ICD-10-CM | POA: Diagnosis not present

## 2019-05-18 DIAGNOSIS — E119 Type 2 diabetes mellitus without complications: Secondary | ICD-10-CM | POA: Diagnosis not present

## 2019-05-18 DIAGNOSIS — I10 Essential (primary) hypertension: Secondary | ICD-10-CM | POA: Diagnosis not present

## 2019-05-18 LAB — CBC WITH DIFFERENTIAL (CANCER CENTER ONLY)
Abs Immature Granulocytes: 0.05 10*3/uL (ref 0.00–0.07)
Basophils Absolute: 0.1 10*3/uL (ref 0.0–0.1)
Basophils Relative: 1 %
Eosinophils Absolute: 0.2 10*3/uL (ref 0.0–0.5)
Eosinophils Relative: 3 %
HCT: 43.1 % (ref 39.0–52.0)
Hemoglobin: 14.8 g/dL (ref 13.0–17.0)
Immature Granulocytes: 1 %
Lymphocytes Relative: 7 %
Lymphs Abs: 0.6 10*3/uL — ABNORMAL LOW (ref 0.7–4.0)
MCH: 30.1 pg (ref 26.0–34.0)
MCHC: 34.3 g/dL (ref 30.0–36.0)
MCV: 87.6 fL (ref 80.0–100.0)
Monocytes Absolute: 1.4 10*3/uL — ABNORMAL HIGH (ref 0.1–1.0)
Monocytes Relative: 16 %
Neutro Abs: 6.4 10*3/uL (ref 1.7–7.7)
Neutrophils Relative %: 72 %
Platelet Count: 208 10*3/uL (ref 150–400)
RBC: 4.92 MIL/uL (ref 4.22–5.81)
RDW: 13.8 % (ref 11.5–15.5)
WBC Count: 8.8 10*3/uL (ref 4.0–10.5)
nRBC: 0 % (ref 0.0–0.2)

## 2019-05-18 LAB — CMP (CANCER CENTER ONLY)
ALT: 40 U/L (ref 0–44)
AST: 22 U/L (ref 15–41)
Albumin: 3.9 g/dL (ref 3.5–5.0)
Alkaline Phosphatase: 82 U/L (ref 38–126)
Anion gap: 8 (ref 5–15)
BUN: 12 mg/dL (ref 6–20)
CO2: 24 mmol/L (ref 22–32)
Calcium: 9.2 mg/dL (ref 8.9–10.3)
Chloride: 107 mmol/L (ref 98–111)
Creatinine: 0.83 mg/dL (ref 0.61–1.24)
GFR, Est AFR Am: >60 mL/min (ref >60)
GFR, Estimated: >60 mL/min (ref >60)
Glucose, Bld: 92 mg/dL (ref 70–99)
Potassium: 4.4 mmol/L (ref 3.5–5.1)
Sodium: 139 mmol/L (ref 135–145)
Total Bilirubin: 0.4 mg/dL (ref 0.3–1.2)
Total Protein: 6.8 g/dL (ref 6.5–8.1)

## 2019-05-18 MED ORDER — SODIUM CHLORIDE 0.9 % IV SOLN
10.0000 mg/kg | Freq: Once | INTRAVENOUS | Status: AC
  Administered 2019-05-18: 860 mg via INTRAVENOUS
  Filled 2019-05-18: qty 10

## 2019-05-18 MED ORDER — SODIUM CHLORIDE 0.9 % IV SOLN
Freq: Once | INTRAVENOUS | Status: AC
  Administered 2019-05-18: 11:00:00 via INTRAVENOUS
  Filled 2019-05-18: qty 250

## 2019-05-18 NOTE — Telephone Encounter (Signed)
Scheduled appt per 10/15 los - pt to get an updated schedule in the treatment area.

## 2019-05-18 NOTE — Patient Instructions (Signed)
[facility] Discharge Instructions for Patients Receiving Chemotherapy  Today you received the following chemotherapy agents: Imfinzi.  To help prevent nausea and vomiting after your treatment, we encourage you to take your nausea medication as directed.   If you develop nausea and vomiting that is not controlled by your nausea medication, call the clinic.   BELOW ARE SYMPTOMS THAT SHOULD BE REPORTED IMMEDIATELY:  *FEVER GREATER THAN 100.5 F  *CHILLS WITH OR WITHOUT FEVER  NAUSEA AND VOMITING THAT IS NOT CONTROLLED WITH YOUR NAUSEA MEDICATION  *UNUSUAL SHORTNESS OF BREATH  *UNUSUAL BRUISING OR BLEEDING  TENDERNESS IN MOUTH AND THROAT WITH OR WITHOUT PRESENCE OF ULCERS  *URINARY PROBLEMS  *BOWEL PROBLEMS  UNUSUAL RASH Items with * indicate a potential emergency and should be followed up as soon as possible.  Feel free to call the clinic should you have any questions or concerns. The clinic phone number is [phone].  Please show the CHEMO ALERT CARD at check-in to the Emergency Department and triage nurse.   

## 2019-05-18 NOTE — Progress Notes (Signed)
[place] OFFICE PROGRESS NOTE  Brian Broker, MD [address] [place] [address]  DIAGNOSIS: Stage IIIB (T1c, N3, M0)non-small cell lung cancer, adenocarcinoma presented with right upper lobe lung mass in addition to mediastinal and bilateral supraclavicular lymphadenopathy diagnosed in January 2020. PD-L1: 10% Guardant 360 molecular studies showed no actionable mutation.  PRIOR THERAPY: 1) Concurrent chemoradiation with weekly carboplatin for AUC of 2 and paclitaxel 45 mg/M2.Status post 6 cycles.  Last dose was given on October 10, 2018 with stable disease. 2) 2) Radiation treatment to the enlarging right cervical lymph nodes under the care of Dr. Sondra Come. First treatment 03/06/2019. Last treatment scheduled on 04/13/2019  CURRENT THERAPY: 1)Consolidation treatment with immunotherapy with Imfinzi 10 mg/KG every 2 weeks. First dose November 17, 2018. Status post13cycles.  INTERVAL HISTORY: Brian [name] 58 y.o. male returns to the clinic for a follow up visit. The patient is feeling well today without any concerning complaints. The patient continues to tolerate treatment with immunotherapy with Imfinzi well without any adverse side effects. Denies any fever, chills, night sweats, or weight loss. He uses mucinex and his flutter valve for his shortness of breath. Denies any chest pain, cough, or hemoptysis. Denies any nausea, vomiting, diarrhea, or constipation. Denies any visual changes. He occasionally has a headache that he associates with stress or tension which improves with tylenol. Denies any rashes or skin changes. The patient recently had a 1 month follow up visit with radiation oncology after completing his palliative radiotherapy to the right cervical lymph node. He has another follow up visit on 06/19/2019.  The patient is here today for evaluation prior to starting cycle # 14 MEDICAL HISTORY: Past Medical History:  Diagnosis Date  . Asthma   . Cancer  ([place])   . Diabetes mellitus without complication ([place])   . Hypertension   . Subarachnoid hemorrhage ([facility])     ALLERGIES:  has No Known Allergies.  MEDICATIONS:  Current Outpatient Medications  Medication Sig Dispense Refill  . amLODipine (NORVASC) 5 MG tablet Take by mouth.    Marland Kitchen atorvastatin (LIPITOR) 20 MG tablet Take by mouth.    . metFORMIN (GLUCOPHAGE) 500 MG tablet Take by mouth.    . prochlorperazine (COMPAZINE) 10 MG tablet Take 1 tablet (10 mg total) by mouth every 6 (six) hours as needed for nausea or vomiting. 30 tablet 0  . Respiratory Therapy Supplies (FLUTTER) DEVI Please use up 10 times daily(4-5 breaths, 4-5 times daily) 1 each 0  . venlafaxine (EFFEXOR) 37.5 MG tablet TAKE 1 TABLET ONCE DAILY.     No current facility-administered medications for this visit.     SURGICAL HISTORY:  Past Surgical History:  Procedure Laterality Date  . LEG SURGERY  age 13    left leg, from fracture    REVIEW OF SYSTEMS:   Review of Systems  Constitutional: Negative for appetite change, chills, fatigue, fever and unexpected weight change.  HENT: Negative for mouth sores, nosebleeds, sore throat and trouble swallowing.   Eyes: Negative for eye problems and icterus.  Respiratory: Positive for baseline shortness of breath. Negative for cough, hemoptysis, and wheezing.   Cardiovascular: Negative for chest pain and leg swelling.  Gastrointestinal: Negative for abdominal pain, constipation, diarrhea, nausea and vomiting.  Genitourinary: Negative for bladder incontinence, difficulty urinating, dysuria, frequency and hematuria.   Musculoskeletal: Negative for back pain, gait problem, neck pain and neck stiffness.  Skin: Negative for itching and rash.  Neurological:Positive for occasional headaches. Negative for  dizziness, extremity weakness, gait problem, light-headedness and seizures.  Hematological: Negative for adenopathy. Does not bruise/bleed easily.  Psychiatric/Behavioral: Negative  for confusion, depression and sleep disturbance. The patient is not nervous/anxious.     PHYSICAL EXAMINATION:  Blood pressure (!) 117/93, pulse 96, temperature 98 F (36.7 C), temperature source Temporal, resp. rate 17, height 5' 8" (1.727 m), weight 192 lb 11.2 oz (87.4 kg), SpO2 98 %.  ECOG PERFORMANCE STATUS: 1 - Symptomatic but completely ambulatory  Physical Exam  Constitutional: Oriented to person, place, and time and well-developed, well-nourished, and in no distress. No distress.  HENT:  Head: Normocephalic and atraumatic.  Mouth/Throat: Oropharynx is clear and moist. No oropharyngeal exudate.  Eyes: Conjunctivae are normal. Right eye exhibits no discharge. Left eye exhibits no discharge. No scleral icterus.  Neck: Normal range of motion. Neck supple.  Cardiovascular: Normal rate, regular rhythm, normal heart sounds and intact distal pulses.   Pulmonary/Chest: Effort normal and breath sounds normal. No respiratory distress. No wheezes. No rales.  Abdominal: Soft. Bowel sounds are normal. Exhibits no distension and no mass. There is no tenderness.  Musculoskeletal: Normal range of motion. Exhibits no edema.  Lymphadenopathy:    Positive for right cervical lymphadenopathy.  Neurological: Alert and oriented to person, place, and time. Exhibits normal muscle tone. Gait normal. Coordination normal.  Skin: Skin is warm and dry. No rash noted. Not diaphoretic. No erythema. No pallor.  Psychiatric: Mood, memory and judgment normal.  Vitals reviewed.  LABORATORY DATA: Lab Results  Component Value Date   WBC 8.8 05/18/2019   HGB 14.8 05/18/2019   HCT 43.1 05/18/2019   MCV 87.6 05/18/2019   PLT 208 05/18/2019      Chemistry      Component Value Date/Time   NA 139 05/18/2019 1009   K 4.4 05/18/2019 1009   CL 107 05/18/2019 1009   CO2 24 05/18/2019 1009   BUN 12 05/18/2019 1009   CREATININE 0.83 05/18/2019 1009      Component Value Date/Time   CALCIUM 9.2 05/18/2019 1009    ALKPHOS 82 05/18/2019 1009   AST 22 05/18/2019 1009   ALT 40 05/18/2019 1009   BILITOT 0.4 05/18/2019 1009       RADIOGRAPHIC STUDIES:  Ct Soft Tissue Neck W Contrast  Result Date: 04/19/2019 CLINICAL DATA:  Lung cancer restaging EXAM: CT NECK WITH CONTRAST TECHNIQUE: Multidetector CT imaging of the neck was performed using the standard protocol following the bolus administration of intravenous contrast. CONTRAST:  177m OMNIPAQUE IOHEXOL 300 MG/ML  SOLN COMPARISON:  02/06/2019 FINDINGS: Pharynx and larynx: No evidence of mass or inflammation. Salivary glands: No inflammation, mass, or stone. Thyroid: Normal. Lymph nodes: Cluster of enlarged lymph nodes in the right posterior triangle with heterogeneous enhancement attributed to necrosis/treatment. The nodes individually measure up to 13 mm long axis, stable from prior. Cluster of mildly rounded lymph nodes along the lower right jugular chain measuring up to 7 mm, more numerous than seen on prior. No contralateral adenopathy. Vascular: Negative Limited intracranial: Negative Visualized orbits: Negative Mastoids and visualized paranasal sinuses: Clear Skeleton: Mild facet spurring. Upper chest: Reported separately IMPRESSION: 1. Stable right posterior triangle lymphadenopathy. 2. Cluster of small but rounded and suspicious lymph nodes along the lower right jugular chain with mild increase from July. Electronically Signed   By: JMonte FantasiaM.D.   On: 04/19/2019 10:11   Ct Chest W Contrast  Result Date: 04/19/2019 CLINICAL DATA:  Restaging lung cancer. Non-small cell adenocarcinoma right lung. Stage  III with ongoing immunotherapy/chemotherapy. EXAM: CT CHEST WITH CONTRAST TECHNIQUE: Multidetector CT imaging of the chest was performed during intravenous contrast administration. CONTRAST:  111m OMNIPAQUE IOHEXOL 300 MG/ML  SOLN COMPARISON:  01/24/2019 FINDINGS: Cardiovascular: Heart is normal size. Subtle calcified plaque over the left anterior  descending coronary artery. Thoracic aorta and pulmonary arterial system are unremarkable. Mediastinum/Nodes: No significant mediastinal or hilar adenopathy. Decrease in size of previously seen 1.1 cm paratracheal lymph node now measuring 6 mm. Previously seen 1 cm right hilar lymph node no longer evident. Continued decrease in size and left neck base/supraclavicular lymph node now measuring 6 mm (previously 1.1 cm). Continued decrease in size of subcentimeter right neck base/supraclavicular lymph nodes. No axillary adenopathy. Lungs/Pleura: Lungs are adequately inflated. Moderately decreased right perihilar/suprahilar consolidation compatible with improving post radiation changes patient's known right upper lobe lung cancer is not visualized and may be partially obscured by this residual consolidation. There is mild nodularity along the posterior superior and posteroinferior aspect of this consolidation. No effusion. Left lung is clear. Airways are unremarkable. Upper Abdomen: No acute findings.  No mass or adenopathy. Musculoskeletal: No focal lytic or sclerotic lesions. IMPRESSION: 1. Moderate interval improvement in post treatment consolidation over the right perihilar/suprahilar region which continues to obscure patient's known right upper lobe lung cancer. 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 2. Continued interval decrease in size of bilateral neck base/supraclavicular, mediastinal and right hilar nodes as described. No new adenopathy. 3.  Subtle atherosclerotic coronary artery disease. Electronically Signed   By: DMarin OlpM.D.   On: 04/19/2019 10:16     ASSESSMENT/PLAN:  This is a very pleasant 58year old Caucasian male diagnosed with stage IIIb non-small cell lung cancer, adenocarcinoma.He  presented with a right upper lobe lung mass in addition to mediastinal and bilateral supraclavicular lymphadenopathy. He was diagnosed in January 2020. His PDL 1 expression is 10% and he has no actionable mutations.  He underwent a course of concurrent chemoradiation with weekly carboplatin and paclitaxel. He is status post 6 cycles. He tolerated well except for fatigue and odynophagia.   He is currently undergoing consolidation immunotherapy with Imfinzi 10 mg/kg IV every 2 weeks. He is status post13cycles of treatment.He tolerated it well without any adverse side effects.He recently completed radiation to the enlarging right cervical lymph nodes. His last radiation treatment was on 04/13/2019  Labs were reviewed today with the patient. Irecommend that he proceed with cycle #14today as scheduled.   We will see him back for a follow up visit in 2 weeks for evaluation before starting cycle #15.    The patient was advised to call immediately if he has any concerning symptoms in the interval. The patient voices understanding of current disease status and treatment options and is in agreement with the current care plan. All questions were answered. The patient knows to call the clinic with any problems, questions or concerns. We can certainly see the patient much sooner if necessary No orders of the defined types were placed in this encounter.    Robt Okuda L Mertie Haslem, PA-C 05/18/19

## 2019-05-19 DIAGNOSIS — Z23 Encounter for immunization: Secondary | ICD-10-CM | POA: Diagnosis not present

## 2019-06-01 ENCOUNTER — Inpatient Hospital Stay: Payer: BC Managed Care – PPO

## 2019-06-01 ENCOUNTER — Other Ambulatory Visit: Payer: Self-pay

## 2019-06-01 ENCOUNTER — Encounter: Payer: Self-pay | Admitting: Internal Medicine

## 2019-06-01 ENCOUNTER — Inpatient Hospital Stay (HOSPITAL_BASED_OUTPATIENT_CLINIC_OR_DEPARTMENT_OTHER): Payer: BC Managed Care – PPO | Admitting: Internal Medicine

## 2019-06-01 VITALS — BP 120/86 | HR 100 | Temp 97.8°F | Resp 18 | Ht 68.0 in | Wt 194.3 lb

## 2019-06-01 DIAGNOSIS — C3491 Malignant neoplasm of unspecified part of right bronchus or lung: Secondary | ICD-10-CM | POA: Diagnosis not present

## 2019-06-01 DIAGNOSIS — R1310 Dysphagia, unspecified: Secondary | ICD-10-CM | POA: Diagnosis not present

## 2019-06-01 DIAGNOSIS — E119 Type 2 diabetes mellitus without complications: Secondary | ICD-10-CM | POA: Diagnosis not present

## 2019-06-01 DIAGNOSIS — J45909 Unspecified asthma, uncomplicated: Secondary | ICD-10-CM | POA: Diagnosis not present

## 2019-06-01 DIAGNOSIS — Z923 Personal history of irradiation: Secondary | ICD-10-CM | POA: Diagnosis not present

## 2019-06-01 DIAGNOSIS — Z5112 Encounter for antineoplastic immunotherapy: Secondary | ICD-10-CM | POA: Diagnosis not present

## 2019-06-01 DIAGNOSIS — I10 Essential (primary) hypertension: Secondary | ICD-10-CM | POA: Diagnosis not present

## 2019-06-01 DIAGNOSIS — Z7984 Long term (current) use of oral hypoglycemic drugs: Secondary | ICD-10-CM | POA: Diagnosis not present

## 2019-06-01 DIAGNOSIS — C3411 Malignant neoplasm of upper lobe, right bronchus or lung: Secondary | ICD-10-CM | POA: Diagnosis not present

## 2019-06-01 DIAGNOSIS — C770 Secondary and unspecified malignant neoplasm of lymph nodes of head, face and neck: Secondary | ICD-10-CM | POA: Diagnosis not present

## 2019-06-01 DIAGNOSIS — Z79899 Other long term (current) drug therapy: Secondary | ICD-10-CM | POA: Diagnosis not present

## 2019-06-01 LAB — CMP (CANCER CENTER ONLY)
ALT: 44 U/L (ref 0–44)
AST: 26 U/L (ref 15–41)
Albumin: 3.8 g/dL (ref 3.5–5.0)
Alkaline Phosphatase: 77 U/L (ref 38–126)
Anion gap: 9 (ref 5–15)
BUN: 19 mg/dL (ref 6–20)
CO2: 21 mmol/L — ABNORMAL LOW (ref 22–32)
Calcium: 9 mg/dL (ref 8.9–10.3)
Chloride: 108 mmol/L (ref 98–111)
Creatinine: 0.84 mg/dL (ref 0.61–1.24)
GFR, Est AFR Am: >60 mL/min (ref >60)
GFR, Estimated: >60 mL/min (ref >60)
Glucose, Bld: 105 mg/dL — ABNORMAL HIGH (ref 70–99)
Potassium: 4.6 mmol/L (ref 3.5–5.1)
Sodium: 138 mmol/L (ref 135–145)
Total Bilirubin: 0.3 mg/dL (ref 0.3–1.2)
Total Protein: 6.7 g/dL (ref 6.5–8.1)

## 2019-06-01 LAB — CBC WITH DIFFERENTIAL (CANCER CENTER ONLY)
Abs Immature Granulocytes: 0.03 10*3/uL (ref 0.00–0.07)
Basophils Absolute: 0.1 10*3/uL (ref 0.0–0.1)
Basophils Relative: 1 %
Eosinophils Absolute: 0.2 10*3/uL (ref 0.0–0.5)
Eosinophils Relative: 2 %
HCT: 42.3 % (ref 39.0–52.0)
Hemoglobin: 14.4 g/dL (ref 13.0–17.0)
Immature Granulocytes: 0 %
Lymphocytes Relative: 7 %
Lymphs Abs: 0.6 10*3/uL — ABNORMAL LOW (ref 0.7–4.0)
MCH: 29.8 pg (ref 26.0–34.0)
MCHC: 34 g/dL (ref 30.0–36.0)
MCV: 87.6 fL (ref 80.0–100.0)
Monocytes Absolute: 1.2 10*3/uL — ABNORMAL HIGH (ref 0.1–1.0)
Monocytes Relative: 14 %
Neutro Abs: 6.6 10*3/uL (ref 1.7–7.7)
Neutrophils Relative %: 76 %
Platelet Count: 189 10*3/uL (ref 150–400)
RBC: 4.83 MIL/uL (ref 4.22–5.81)
RDW: 13.4 % (ref 11.5–15.5)
WBC Count: 8.7 10*3/uL (ref 4.0–10.5)
nRBC: 0 % (ref 0.0–0.2)

## 2019-06-01 MED ORDER — SODIUM CHLORIDE 0.9 % IV SOLN
10.0000 mg/kg | Freq: Once | INTRAVENOUS | Status: AC
  Administered 2019-06-01: 860 mg via INTRAVENOUS
  Filled 2019-06-01: qty 10

## 2019-06-01 MED ORDER — SODIUM CHLORIDE 0.9 % IV SOLN
Freq: Once | INTRAVENOUS | Status: AC
  Administered 2019-06-01: 10:00:00 via INTRAVENOUS
  Filled 2019-06-01: qty 250

## 2019-06-01 NOTE — Progress Notes (Signed)
[place] [phone]   [phone]  OFFICE PROGRESS NOTE  [name], MD [address] [name] [address]  DIAGNOSIS: stage IIIB (T1c, N3, M0)non-small cell lung cancer, adenocarcinoma presented with right upper lobe lung mass in addition to mediastinal and bilateral supraclavicular lymphadenopathy diagnosed in January 2020. PD-L1: 10% Guardant 360 molecular studies showed no actionable mutation.  PRIOR THERAPY: Concurrent chemoradiation with weekly carboplatin for AUC of 2 and paclitaxel 45 mg/M2.  Status post 6 cycles.  Last dose was given on October 10, 2018 with stable disease.  CURRENT THERAPY:  Consolidation treatment with immunotherapy with Imfinzi 10 mg/KG every 2 weeks.  First dose November 17, 2018.  Status post 14 cycles.  INTERVAL HISTORY: Brian [name] 58 y.o. male returns to the clinic today for follow-up visit.  The patient is feeling fine today with no concerning complaints.  He has been tolerating his treatment with Imfinzi fairly well.  He denied having any chest pain, shortness of breath, cough or hemoptysis.  He has no nausea, vomiting, diarrhea or constipation.  He has no headache or visual changes.  The patient is here today for evaluation before starting cycle #15.  MEDICAL HISTORY: Past Medical History:  Diagnosis Date  . Asthma   . Cancer ([place])   . Diabetes mellitus without complication ([place])   . Hypertension   . Subarachnoid hemorrhage ([facility])     ALLERGIES:  has No Known Allergies.  MEDICATIONS:  Current Outpatient Medications  Medication Sig Dispense Refill  . amLODipine (NORVASC) 5 MG tablet Take by mouth.    Marland Kitchen atorvastatin (LIPITOR) 20 MG tablet Take by mouth.    . metFORMIN (GLUCOPHAGE) 500 MG tablet Take by mouth.    . prochlorperazine (COMPAZINE) 10 MG tablet Take 1 tablet (10 mg total) by mouth every 6 (six) hours as needed for nausea or vomiting. 30 tablet 0  . Respiratory Therapy  Supplies (FLUTTER) DEVI Please use up 10 times daily(4-5 breaths, 4-5 times daily) 1 each 0  . venlafaxine (EFFEXOR) 37.5 MG tablet TAKE 1 TABLET ONCE DAILY.     No current facility-administered medications for this visit.     SURGICAL HISTORY:  Past Surgical History:  Procedure Laterality Date  . LEG SURGERY  age 47    left leg, from fracture    REVIEW OF SYSTEMS:  A comprehensive review of systems was negative.   PHYSICAL EXAMINATION: General appearance: alert, cooperative, fatigued and no distress Head: Normocephalic, without obvious abnormality, atraumatic Neck: no adenopathy, no JVD, supple, symmetrical, trachea midline and thyroid not enlarged, symmetric, no tenderness/mass/nodules Lymph nodes: Cervical, supraclavicular, and axillary nodes normal. Resp: clear to auscultation bilaterally Back: symmetric, no curvature. ROM normal. No CVA tenderness. Cardio: regular rate and rhythm, S1, S2 normal, no murmur, click, rub or gallop GI: soft, non-tender; bowel sounds normal; no masses,  no organomegaly Extremities: extremities normal, atraumatic, no cyanosis or edema  ECOG PERFORMANCE STATUS: 1 - Symptomatic but completely ambulatory  Blood pressure 120/86, pulse 100, temperature 97.8 F (36.6 C), temperature source Temporal, resp. rate 18, height '5\' 8"'  (1.727 m), weight 194 lb 4.8 oz (88.1 kg), SpO2 97 %.  LABORATORY DATA: Lab Results  Component Value Date   WBC 8.7 06/01/2019   HGB 14.4 06/01/2019   HCT 42.3 06/01/2019   MCV 87.6 06/01/2019   PLT 189 06/01/2019      Chemistry      Component Value Date/Time   NA 139  05/18/2019 1009   K 4.4 05/18/2019 1009   CL 107 05/18/2019 1009   CO2 24 05/18/2019 1009   BUN 12 05/18/2019 1009   CREATININE 0.83 05/18/2019 1009      Component Value Date/Time   CALCIUM 9.2 05/18/2019 1009   ALKPHOS 82 05/18/2019 1009   AST 22 05/18/2019 1009   ALT 40 05/18/2019 1009   BILITOT 0.4 05/18/2019 1009       RADIOGRAPHIC STUDIES:  No results found.  ASSESSMENT AND PLAN: This is a very pleasant 58 years old white male with a stage IIIb non-small cell lung cancer, adenocarcinoma diagnosed in January 2020 with PDL 1 expression of 10% and no actionable mutations on Guardant 360 blood test. The patient underwent a course of concurrent chemoradiation with weekly carboplatin and paclitaxel status post 6 cycles.  He tolerated this course fairly well except for fatigue and odynophagia which significantly improved. The patient is currently undergoing consolidation treatment with Imfinzi 10 mg/KG every 2 weeks status post 14 cycles.  His restaging scan after cycle #6 showed new cluster of mildly enlarged lymph nodes in the right posterior triangle of the neck.  The patient is also undergoing palliative radiotherapy to this area under the care of Dr. Sondra Come. The patient continues to tolerate his immunotherapy fairly well. I recommended for him to proceed with cycle #15 today as planned. I will see him back for follow-up visit in 2 weeks for evaluation before the next cycle of his treatment. He was advised to call immediately if he has any concerning symptoms in the interval. The patient voices understanding of current disease status and treatment options and is in agreement with the current care plan. All questions were answered. The patient knows to call the clinic with any problems, questions or concerns. We can certainly see the patient much sooner if necessary.  Disclaimer: This note was dictated with voice recognition software. Similar sounding words can inadvertently be transcribed and may not be corrected upon review.

## 2019-06-01 NOTE — Patient Instructions (Signed)
[facility] Discharge Instructions for Patients Receiving Chemotherapy  Today you received the following chemotherapy agents: Imfinzi.  To help prevent nausea and vomiting after your treatment, we encourage you to take your nausea medication as directed.   If you develop nausea and vomiting that is not controlled by your nausea medication, call the clinic.   BELOW ARE SYMPTOMS THAT SHOULD BE REPORTED IMMEDIATELY:  *FEVER GREATER THAN 100.5 F  *CHILLS WITH OR WITHOUT FEVER  NAUSEA AND VOMITING THAT IS NOT CONTROLLED WITH YOUR NAUSEA MEDICATION  *UNUSUAL SHORTNESS OF BREATH  *UNUSUAL BRUISING OR BLEEDING  TENDERNESS IN MOUTH AND THROAT WITH OR WITHOUT PRESENCE OF ULCERS  *URINARY PROBLEMS  *BOWEL PROBLEMS  UNUSUAL RASH Items with * indicate a potential emergency and should be followed up as soon as possible.  Feel free to call the clinic should you have any questions or concerns. The clinic phone number is [phone].  Please show the CHEMO ALERT CARD at check-in to the Emergency Department and triage nurse.   

## 2019-06-02 ENCOUNTER — Telehealth: Payer: Self-pay | Admitting: Internal Medicine

## 2019-06-02 NOTE — Telephone Encounter (Signed)
No 10/29 LOS

## 2019-06-15 ENCOUNTER — Inpatient Hospital Stay: Payer: BC Managed Care – PPO | Attending: Internal Medicine

## 2019-06-15 ENCOUNTER — Other Ambulatory Visit: Payer: Self-pay

## 2019-06-15 ENCOUNTER — Inpatient Hospital Stay: Payer: BC Managed Care – PPO

## 2019-06-15 ENCOUNTER — Inpatient Hospital Stay (HOSPITAL_BASED_OUTPATIENT_CLINIC_OR_DEPARTMENT_OTHER): Payer: BC Managed Care – PPO | Admitting: Physician Assistant

## 2019-06-15 ENCOUNTER — Telehealth: Payer: Self-pay | Admitting: Internal Medicine

## 2019-06-15 VITALS — HR 98

## 2019-06-15 VITALS — BP 123/95 | HR 109 | Temp 98.0°F | Resp 18 | Ht 68.0 in | Wt 193.9 lb

## 2019-06-15 DIAGNOSIS — C3491 Malignant neoplasm of unspecified part of right bronchus or lung: Secondary | ICD-10-CM

## 2019-06-15 DIAGNOSIS — I10 Essential (primary) hypertension: Secondary | ICD-10-CM | POA: Diagnosis not present

## 2019-06-15 DIAGNOSIS — Z79899 Other long term (current) drug therapy: Secondary | ICD-10-CM | POA: Diagnosis not present

## 2019-06-15 DIAGNOSIS — Z923 Personal history of irradiation: Secondary | ICD-10-CM | POA: Insufficient documentation

## 2019-06-15 DIAGNOSIS — Z5112 Encounter for antineoplastic immunotherapy: Secondary | ICD-10-CM | POA: Diagnosis not present

## 2019-06-15 DIAGNOSIS — J45909 Unspecified asthma, uncomplicated: Secondary | ICD-10-CM | POA: Diagnosis not present

## 2019-06-15 DIAGNOSIS — E119 Type 2 diabetes mellitus without complications: Secondary | ICD-10-CM | POA: Insufficient documentation

## 2019-06-15 DIAGNOSIS — Z7984 Long term (current) use of oral hypoglycemic drugs: Secondary | ICD-10-CM | POA: Diagnosis not present

## 2019-06-15 DIAGNOSIS — C3411 Malignant neoplasm of upper lobe, right bronchus or lung: Secondary | ICD-10-CM | POA: Insufficient documentation

## 2019-06-15 LAB — CMP (CANCER CENTER ONLY)
ALT: 38 U/L (ref 0–44)
AST: 22 U/L (ref 15–41)
Albumin: 3.9 g/dL (ref 3.5–5.0)
Alkaline Phosphatase: 81 U/L (ref 38–126)
Anion gap: 10 (ref 5–15)
BUN: 15 mg/dL (ref 6–20)
CO2: 22 mmol/L (ref 22–32)
Calcium: 8.9 mg/dL (ref 8.9–10.3)
Chloride: 107 mmol/L (ref 98–111)
Creatinine: 0.87 mg/dL (ref 0.61–1.24)
GFR, Est AFR Am: >60 mL/min (ref >60)
GFR, Estimated: >60 mL/min (ref >60)
Glucose, Bld: 127 mg/dL — ABNORMAL HIGH (ref 70–99)
Potassium: 4.2 mmol/L (ref 3.5–5.1)
Sodium: 139 mmol/L (ref 135–145)
Total Bilirubin: 0.3 mg/dL (ref 0.3–1.2)
Total Protein: 6.8 g/dL (ref 6.5–8.1)

## 2019-06-15 LAB — CBC WITH DIFFERENTIAL (CANCER CENTER ONLY)
Abs Immature Granulocytes: 0.02 10*3/uL (ref 0.00–0.07)
Basophils Absolute: 0 10*3/uL (ref 0.0–0.1)
Basophils Relative: 0 %
Eosinophils Absolute: 0.2 10*3/uL (ref 0.0–0.5)
Eosinophils Relative: 3 %
HCT: 43.7 % (ref 39.0–52.0)
Hemoglobin: 14.7 g/dL (ref 13.0–17.0)
Immature Granulocytes: 0 %
Lymphocytes Relative: 7 %
Lymphs Abs: 0.5 10*3/uL — ABNORMAL LOW (ref 0.7–4.0)
MCH: 30.2 pg (ref 26.0–34.0)
MCHC: 33.6 g/dL (ref 30.0–36.0)
MCV: 89.7 fL (ref 80.0–100.0)
Monocytes Absolute: 0.9 10*3/uL (ref 0.1–1.0)
Monocytes Relative: 11 %
Neutro Abs: 6.3 10*3/uL (ref 1.7–7.7)
Neutrophils Relative %: 79 %
Platelet Count: 186 10*3/uL (ref 150–400)
RBC: 4.87 MIL/uL (ref 4.22–5.81)
RDW: 13.2 % (ref 11.5–15.5)
WBC Count: 8 10*3/uL (ref 4.0–10.5)
nRBC: 0 % (ref 0.0–0.2)

## 2019-06-15 LAB — TSH: TSH: 4.151 u[IU]/mL — ABNORMAL HIGH (ref 0.320–4.118)

## 2019-06-15 MED ORDER — SODIUM CHLORIDE 0.9 % IV SOLN
Freq: Once | INTRAVENOUS | Status: AC
  Administered 2019-06-15: 09:00:00 via INTRAVENOUS
  Filled 2019-06-15: qty 250

## 2019-06-15 MED ORDER — SODIUM CHLORIDE 0.9 % IV SOLN
10.0000 mg/kg | Freq: Once | INTRAVENOUS | Status: AC
  Administered 2019-06-15: 860 mg via INTRAVENOUS
  Filled 2019-06-15: qty 10

## 2019-06-15 NOTE — Telephone Encounter (Signed)
Scheduled per los. Called and left mssg. Mailed printout

## 2019-06-15 NOTE — Patient Instructions (Signed)
[facility] Discharge Instructions for Patients Receiving Chemotherapy  Today you received the following chemotherapy agents: Imfinzi.  To help prevent nausea and vomiting after your treatment, we encourage you to take your nausea medication as directed.   If you develop nausea and vomiting that is not controlled by your nausea medication, call the clinic.   BELOW ARE SYMPTOMS THAT SHOULD BE REPORTED IMMEDIATELY:  *FEVER GREATER THAN 100.5 F  *CHILLS WITH OR WITHOUT FEVER  NAUSEA AND VOMITING THAT IS NOT CONTROLLED WITH YOUR NAUSEA MEDICATION  *UNUSUAL SHORTNESS OF BREATH  *UNUSUAL BRUISING OR BLEEDING  TENDERNESS IN MOUTH AND THROAT WITH OR WITHOUT PRESENCE OF ULCERS  *URINARY PROBLEMS  *BOWEL PROBLEMS  UNUSUAL RASH Items with * indicate a potential emergency and should be followed up as soon as possible.  Feel free to call the clinic should you have any questions or concerns. The clinic phone number is [phone].  Please show the CHEMO ALERT CARD at check-in to the Emergency Department and triage nurse.   

## 2019-06-15 NOTE — Progress Notes (Signed)
[facility] OFF[facility] PROGRESS NOTE  [name], MD [address] [place] [address]  DIAGNOSIS: Stage IIIB (T1c, N3, M0)non-small cell lung cancer, adenocarcinoma presented with right upper lobe lung mass in addition to mediastinal and bilateral supraclavicular lymphadenopathy diagnosed in January 2020. PD-L1: 10% Guardant 360 molecular studiesshowed no actionable mutation.  PRIOR THERAPY:  1) Concurrent chemoradiation with weekly carboplatin for AUC of 2 and paclitaxel 45 mg/M2.Status post 6 cycles. Last dose was given on October 10, 2018 with stable disease. 2) Radiation treatment to the enlarging right cervical lymph nodes under the care of Dr. Sondra Come. First treatment 03/06/2019. Last treatment scheduled on 04/13/2019  CURRENT THERAPY: 1)Consolidation treatment with immunotherapy with Imfinzi 10 mg/KG every 2 weeks. First dose November 17, 2018. Status post15cycles.  INTERVAL HISTORY: Brian [name] 58 y.o. male returns to the clinic for a follow up visit. The patient is feeling well today without any concerning complaints. The patient continues to tolerate treatment with immunotherapy with Imfinzi well without any adverse side effects. Denies any fever, chills, night sweats, or weight loss. Denies any chest pain, cough, or hemoptysis. He reports his baseline shortness of breath with exertion; however, his breathing has been good recently and he recently played three rounds of golf. He uses mucinex and his flutter valve for his breathing. Denies any nausea, vomiting, diarrhea, or constipation. Denies any headache or visual changes. Denies any rashes or skin changes. He has a follow up appointment with Dr. Sondra Come for a radiation follow up appointment on 11/16.The patient is here today for evaluation prior to starting cycle # 16   MEDICAL HISTORY: Past Medical History:  Diagnosis Date  . Asthma   . Cancer ([facility])   . Diabetes mellitus without complication  ([place])   . Hypertension   . Subarachnoid hemorrhage ([facility])     ALLERGIES:  has No Known Allergies.  MEDICATIONS:  Current Outpatient Medications  Medication Sig Dispense Refill  . amLODipine (NORVASC) 5 MG tablet Take by mouth.    Marland Kitchen atorvastatin (LIPITOR) 20 MG tablet Take by mouth.    . metFORMIN (GLUCOPHAGE) 500 MG tablet Take by mouth.    . prochlorperazine (COMPAZINE) 10 MG tablet Take 1 tablet (10 mg total) by mouth every 6 (six) hours as needed for nausea or vomiting. 30 tablet 0  . Respiratory Therapy Supplies (FLUTTER) DEVI Please use up 10 times daily(4-5 breaths, 4-5 times daily) 1 each 0  . venlafaxine (EFFEXOR) 37.5 MG tablet TAKE 1 TABLET ONCE DAILY.     No current facility-administered medications for this visit.     SURGICAL HISTORY:  Past Surgical History:  Procedure Laterality Date  . LEG SURGERY  age 58    left leg, from fracture    REVIEW OF SYSTEMS:   Review of Systems  Constitutional: Negative for appetite change, chills, fatigue, fever and unexpected weight change.  HENT: Negative for mouth sores, nosebleeds, sore throat and trouble swallowing.   Eyes: Negative for eye problems and icterus.  Respiratory: Positive for baseline shortness of breath. Negative for cough, hemoptysis, and wheezing.   Cardiovascular: Negative for chest pain and leg swelling.  Gastrointestinal: Negative for abdominal pain, constipation, diarrhea, nausea and vomiting.  Genitourinary: Negative for bladder incontinence, difficulty urinating, dysuria, frequency and hematuria.   Musculoskeletal: Negative for back pain, gait problem, neck pain and neck stiffness.  Skin: Negative for itching and rash.  Neurological: Negative for dizziness, extremity weakness, gait problem, headaches, light-headedness and seizures.  Hematological: Negative  for adenopathy. Does not bruise/bleed easily.  Psychiatric/Behavioral: Negative for confusion, depression and sleep disturbance. The patient is not  nervous/anxious.     PHYSICAL EXAMINATION:  Blood pressure (!) 123/95, pulse (!) 109, temperature 98 F (36.7 C), temperature source Temporal, resp. rate 18, height '5\' 8"'  (1.727 m), weight 193 lb 14.4 oz (88 kg), SpO2 98 %.  ECOG PERFORMANCE STATUS: 1 - Symptomatic but completely ambulatory  Physical Exam  Constitutional: Oriented to person, place, and time and well-developed, well-nourished, and in no distress.  HENT:  Head: Normocephalic and atraumatic.  Mouth/Throat: Oropharynx is clear and moist. No oropharyngeal exudate.  Eyes: Conjunctivae are normal. Right eye exhibits no discharge. Left eye exhibits no discharge. No scleral icterus.  Neck: Normal range of motion. Neck supple.  Cardiovascular: Normal rate, regular rhythm, normal heart sounds and intact distal pulses.   Pulmonary/Chest: Effort normal and breath sounds normal. No respiratory distress. No wheezes. No rales.  Abdominal: Soft. Bowel sounds are normal. Exhibits no distension and no mass. There is no tenderness.  Musculoskeletal: Normal range of motion. Exhibits no edema.  Lymphadenopathy:    Positive for right cervical lymphadenopathy (decreased in size from prior)  Neurological: Alert and oriented to person, place, and time. Exhibits normal muscle tone. Gait normal. Coordination normal.  Skin: Skin is warm and dry. No rash noted. Not diaphoretic. No erythema. No pallor.  Psychiatric: Mood, memory and judgment normal.  Vitals reviewed.  LABORATORY DATA: Lab Results  Component Value Date   WBC 8.0 06/15/2019   HGB 14.7 06/15/2019   HCT 43.7 06/15/2019   MCV 89.7 06/15/2019   PLT 186 06/15/2019      Chemistry      Component Value Date/Time   NA 139 06/15/2019 0828   K 4.2 06/15/2019 0828   CL 107 06/15/2019 0828   CO2 22 06/15/2019 0828   BUN 15 06/15/2019 0828   CREATININE 0.87 06/15/2019 0828      Component Value Date/Time   CALCIUM 8.9 06/15/2019 0828   ALKPHOS 81 06/15/2019 0828   AST 22  06/15/2019 0828   ALT 38 06/15/2019 0828   BILITOT 0.3 06/15/2019 0828       RADIOGRAPHIC STUDIES:  No results found.   ASSESSMENT/PLAN:  This is a very pleasant 58 year old Caucasian male diagnosed with stage IIIb non-small cell lung cancer, adenocarcinoma.He presented with a right upper lobe lung mass in addition to mediastinal and bilateral supraclavicular lymphadenopathy. He was diagnosed in January 2020. His PDL 1 expression is 10% and he has no actionable mutations.  He underwent a course of concurrent chemoradiation with weekly carboplatin and paclitaxel. He is status post 6 cycles. He tolerated well except for fatigue and odynophagia.   He is currently undergoing consolidation immunotherapy with Imfinzi 10 mg/kg IV every 2 weeks. He is status post15cycles of treatment.He tolerated it well without any adverse side effects.He recently completed radiation to the enlarging right cervical lymph nodes.His last radiation treatment was on 04/13/2019. He has a follow up appointment with Dr. Sondra Come in radiation oncology on 06/19/2019.   Labs were reviewed todaywith the patient.Irecommend that he proceed with cycle #16today as scheduled.  We will see him back for a follow up visit in2weeks for evaluation before starting cycle #17.   We will continue monitoring his TSH closely. His TSH is pending at this time.   The patient was advised to call immediately if he has any concerning symptoms in the interval. The patient voices understanding of current disease status and  treatment options and is in agreement with the current care plan. All questions were answered. The patient knows to call the clinic with any problems, questions or concerns. We can certainly see the patient much sooner if necessary  No orders of the defined types were placed in this encounter.    Cassandra L Heilingoetter, PA-C 06/15/19

## 2019-06-19 ENCOUNTER — Ambulatory Visit
Admission: RE | Admit: 2019-06-19 | Discharge: 2019-06-19 | Disposition: A | Discharge status: Home / Self Care | Payer: BC Managed Care – PPO | Source: Ambulatory Visit | Attending: Radiation Oncology | Admitting: Radiation Oncology

## 2019-06-19 ENCOUNTER — Other Ambulatory Visit: Payer: Self-pay

## 2019-06-19 ENCOUNTER — Encounter: Payer: Self-pay | Admitting: Radiation Oncology

## 2019-06-19 VITALS — BP 106/81 | HR 97 | Temp 98.2°F | Resp 18 | Ht 68.0 in | Wt 193.5 lb

## 2019-06-19 DIAGNOSIS — Z79899 Other long term (current) drug therapy: Secondary | ICD-10-CM | POA: Insufficient documentation

## 2019-06-19 DIAGNOSIS — Z923 Personal history of irradiation: Secondary | ICD-10-CM | POA: Diagnosis not present

## 2019-06-19 DIAGNOSIS — C778 Secondary and unspecified malignant neoplasm of lymph nodes of multiple regions: Secondary | ICD-10-CM | POA: Insufficient documentation

## 2019-06-19 DIAGNOSIS — R5383 Other fatigue: Secondary | ICD-10-CM | POA: Insufficient documentation

## 2019-06-19 DIAGNOSIS — C3411 Malignant neoplasm of upper lobe, right bronchus or lung: Secondary | ICD-10-CM | POA: Diagnosis not present

## 2019-06-19 DIAGNOSIS — Z7984 Long term (current) use of oral hypoglycemic drugs: Secondary | ICD-10-CM | POA: Diagnosis not present

## 2019-06-19 DIAGNOSIS — C3491 Malignant neoplasm of unspecified part of right bronchus or lung: Secondary | ICD-10-CM

## 2019-06-19 NOTE — Patient Instructions (Signed)
Coronavirus (COVID-19) Are you at risk?  Are you at risk for the Coronavirus (COVID-19)?  To be considered HIGH RISK for Coronavirus (COVID-19), you have to meet the following criteria:  . Traveled to [place], [place], [place], [place] or [place]; or in the [place] to [place], [place], [place], or [place]; and have fever, cough, and shortness of breath within the last 2 weeks of travel OR . Been in close contact with a person diagnosed with COVID-19 within the last 2 weeks and have fever, cough, and shortness of breath . IF YOU DO NOT MEET THESE CRITERIA, YOU ARE CONSIDERED LOW RISK FOR COVID-19.  What to do if you are HIGH RISK for COVID-19?  . If you are having a medical emergency, call 911. . Seek medical care right away. Before you go to a doctor's office, urgent care or emergency department, call ahead and tell them about your recent travel, contact with someone diagnosed with COVID-19, and your symptoms. You should receive instructions from your physician's office regarding next steps of care.  . When you arrive at healthcare provider, tell the healthcare staff immediately you have returned from visiting [place], [place], [place], [place] or [place]; or traveled in the [place] to [place], [place], [place], or [place]; in the last two weeks or you have been in close contact with a person diagnosed with COVID-19 in the last 2 weeks.   . Tell the health care staff about your symptoms: fever, cough and shortness of breath. . After you have been seen by a medical provider, you will be either: o Tested for (COVID-19) and discharged home on quarantine except to seek medical care if symptoms worsen, and asked to  - Stay home and avoid contact with others until you get your results (4-5 days)  - Avoid travel on public transportation if possible (such as bus, train, or airplane) or o Sent to the Emergency Department by EMS for evaluation, COVID-19 testing, and possible  admission depending on your condition and test results.  What to do if you are LOW RISK for COVID-19?  Reduce your risk of any infection by using the same precautions used for avoiding the common cold or flu:  . Wash your hands often with soap and warm water for at least 20 seconds.  If soap and water are not readily available, use an alcohol-based hand sanitizer with at least 60% alcohol.  . If coughing or sneezing, cover your mouth and nose by coughing or sneezing into the elbow areas of your shirt or coat, into a tissue or into your sleeve (not your hands). . Avoid shaking hands with others and consider head nods or verbal greetings only. . Avoid touching your eyes, nose, or mouth with unwashed hands.  . Avoid close contact with people who are sick. . Avoid places or events with large numbers of people in one location, like concerts or sporting events. . Carefully consider travel plans you have or are making. . If you are planning any travel outside or inside the [place], visit the CDC's Travelers' Health webpage for the latest health notices. . If you have some symptoms but not all symptoms, continue to monitor at home and seek medical attention if your symptoms worsen. . If you are having a medical emergency, call 911.   ADDITIONAL HEALTHCARE OPTIONS FOR PATIENTS  [facility] / e-Visit: [email or web address]         [facility]: [phone]  [facility]: [phone]                   [facility]: [phone]   

## 2019-06-19 NOTE — Progress Notes (Signed)
Radiation Oncology         [phone]) [phone] ________________________________  Name: Brian [name] MRN: [id number]  Date: 06/19/2019  DOB: [birth year]  Follow-Up Visit Note  CC: Brian Broker, MD  Brian Broker, MD    ICD-10-CM   1. Adenocarcinoma of right lung, stage 3 (HCC)  C34.91     Diagnosis: Stage IIIB (T1c, N3, M0)non-small cell lung cancer, adenocarcinoma presented with right upper lobe lung mass in addition to mediastinal and bilateral supraclavicular lymphadenopathy diagnosed in January 2020.  Interval Since Last Radiation: 2 months  03/02/2019 through 04/13/2019  Site Technique Total Dose Dose per Fx Completed Fx Beam Energies  Head & neck: HN_Rt IMRT 60/60 2 30/30 6X   Narrative:  The patient returns today for routine follow-up. He last saw Dr. Julien Nordmann on 06/15/2019 and continues on immunotherapy with Imfinzi. He is status post sixteen cycles and is tolerating it well.  On review of systems, he reports fatigue but is able to work 4 days a week.  He denies any difficulty swallowing or taste changes.  He denies any breathing problems,  new bony pain headaches dizziness or blurred vision.,  ALLERGIES:  has No Known Allergies.  Meds: Current Outpatient Medications  Medication Sig Dispense Refill  . amLODipine (NORVASC) 5 MG tablet Take by mouth.    Marland Kitchen atorvastatin (LIPITOR) 20 MG tablet Take by mouth.    . metFORMIN (GLUCOPHAGE) 500 MG tablet Take by mouth.    . prochlorperazine (COMPAZINE) 10 MG tablet Take 1 tablet (10 mg total) by mouth every 6 (six) hours as needed for nausea or vomiting. 30 tablet 0  . Respiratory Therapy Supplies (FLUTTER) DEVI Please use up 10 times daily(4-5 breaths, 4-5 times daily) 1 each 0  . venlafaxine (EFFEXOR) 37.5 MG tablet TAKE 1 TABLET ONCE DAILY.     No current facility-administered medications for this encounter.     Physical Findings: The patient is in no acute distress. Patient is alert and oriented.  height is 5\' 8"   (1.727 m) and weight is 193 lb 8 oz (87.8 kg). His temporal temperature is 98.2 F (36.8 C). His blood pressure is 106/81 and his pulse is 97. His respiration is 18 and oxygen saturation is 99%. .  No significant changes. Lungs are clear to auscultation bilaterally. Heart has regular rate and rhythm. No palpable cervical, supraclavicular, or axillary adenopathy. Abdomen soft, non-tender, normal bowel sounds.  Patient skin along the right lower neck area has healed well.  The palpable lymph node in the right mid to lower neck has decreased in size since my last evaluation   Lab Findings: Lab Results  Component Value Date   WBC 8.0 06/15/2019   HGB 14.7 06/15/2019   HCT 43.7 06/15/2019   MCV 89.7 06/15/2019   PLT 186 06/15/2019    Radiographic Findings: No results found.  Impression:  The patient is recovering from the effects of radiation.  Patient will continue on immunotherapy.  The palpable lymph node in the right neck has responded to his immunotherapy and therefore would not recommend a short course of radiation therapy to this region  Plan: As needed follow-up in radiation oncology.  Patient will continue on immunotherapy as above  ____________________________________ -----------------------------------  Blair Promise, PhD, MD This document serves as a record of services personally performed by Gery Pray, MD. It was created on his behalf by Clerance Lav, a trained medical scribe. The creation of this record is based on the scribe's personal observations and  the provider's statements to them. This document has been checked and approved by the attending provider.

## 2019-06-19 NOTE — Progress Notes (Signed)
Brian [name] presents today for f/u with Dr. Sondra Come. Pt denies c/o pain. Pt denies difficulty swallowing, taste changes. Pt reports sporadic cough, non-productive. Pt denies hemoptysis. Pt reports SOB with exertion "If I push myself too hard". Pt reports "everything's good".  BP 106/81 (BP Location: Left Arm, Patient Position: Sitting)   Pulse 97   Temp 98.2 F (36.8 C) (Temporal)   Resp 18   Ht 5\' 8"  (1.727 m)   Wt 193 lb 8 oz (87.8 kg)   SpO2 99%   BMI 29.42 kg/m   Wt Readings from Last 3 Encounters:  06/19/19 193 lb 8 oz (87.8 kg)  06/15/19 193 lb 14.4 oz (88 kg)  06/01/19 194 lb 4.8 oz (88.1 kg)   Loma Sousa, RN BSN

## 2019-06-28 ENCOUNTER — Inpatient Hospital Stay (HOSPITAL_BASED_OUTPATIENT_CLINIC_OR_DEPARTMENT_OTHER): Payer: BC Managed Care – PPO | Admitting: Physician Assistant

## 2019-06-28 ENCOUNTER — Inpatient Hospital Stay: Payer: BC Managed Care – PPO

## 2019-06-28 ENCOUNTER — Other Ambulatory Visit: Payer: Self-pay

## 2019-06-28 ENCOUNTER — Encounter: Payer: Self-pay | Admitting: Physician Assistant

## 2019-06-28 VITALS — BP 122/88 | HR 90 | Temp 98.3°F | Resp 18 | Ht 68.0 in | Wt 192.6 lb

## 2019-06-28 DIAGNOSIS — E119 Type 2 diabetes mellitus without complications: Secondary | ICD-10-CM | POA: Diagnosis not present

## 2019-06-28 DIAGNOSIS — Z5112 Encounter for antineoplastic immunotherapy: Secondary | ICD-10-CM | POA: Diagnosis not present

## 2019-06-28 DIAGNOSIS — J45909 Unspecified asthma, uncomplicated: Secondary | ICD-10-CM | POA: Diagnosis not present

## 2019-06-28 DIAGNOSIS — Z79899 Other long term (current) drug therapy: Secondary | ICD-10-CM | POA: Diagnosis not present

## 2019-06-28 DIAGNOSIS — Z923 Personal history of irradiation: Secondary | ICD-10-CM | POA: Diagnosis not present

## 2019-06-28 DIAGNOSIS — C3491 Malignant neoplasm of unspecified part of right bronchus or lung: Secondary | ICD-10-CM

## 2019-06-28 DIAGNOSIS — I10 Essential (primary) hypertension: Secondary | ICD-10-CM | POA: Diagnosis not present

## 2019-06-28 DIAGNOSIS — Z7984 Long term (current) use of oral hypoglycemic drugs: Secondary | ICD-10-CM | POA: Diagnosis not present

## 2019-06-28 DIAGNOSIS — C3411 Malignant neoplasm of upper lobe, right bronchus or lung: Secondary | ICD-10-CM | POA: Diagnosis not present

## 2019-06-28 LAB — CMP (CANCER CENTER ONLY)
ALT: 54 U/L — ABNORMAL HIGH (ref 0–44)
AST: 26 U/L (ref 15–41)
Albumin: 4 g/dL (ref 3.5–5.0)
Alkaline Phosphatase: 72 U/L (ref 38–126)
Anion gap: 9 (ref 5–15)
BUN: 13 mg/dL (ref 6–20)
CO2: 23 mmol/L (ref 22–32)
Calcium: 8.8 mg/dL — ABNORMAL LOW (ref 8.9–10.3)
Chloride: 107 mmol/L (ref 98–111)
Creatinine: 0.84 mg/dL (ref 0.61–1.24)
GFR, Est AFR Am: >60 mL/min (ref >60)
GFR, Estimated: >60 mL/min (ref >60)
Glucose, Bld: 105 mg/dL — ABNORMAL HIGH (ref 70–99)
Potassium: 4.3 mmol/L (ref 3.5–5.1)
Sodium: 139 mmol/L (ref 135–145)
Total Bilirubin: 0.3 mg/dL (ref 0.3–1.2)
Total Protein: 6.6 g/dL (ref 6.5–8.1)

## 2019-06-28 LAB — CBC WITH DIFFERENTIAL (CANCER CENTER ONLY)
Abs Immature Granulocytes: 0.03 10*3/uL (ref 0.00–0.07)
Basophils Absolute: 0.1 10*3/uL (ref 0.0–0.1)
Basophils Relative: 1 %
Eosinophils Absolute: 0.3 10*3/uL (ref 0.0–0.5)
Eosinophils Relative: 3 %
HCT: 42.8 % (ref 39.0–52.0)
Hemoglobin: 14.6 g/dL (ref 13.0–17.0)
Immature Granulocytes: 0 %
Lymphocytes Relative: 8 %
Lymphs Abs: 0.7 10*3/uL (ref 0.7–4.0)
MCH: 30 pg (ref 26.0–34.0)
MCHC: 34.1 g/dL (ref 30.0–36.0)
MCV: 88.1 fL (ref 80.0–100.0)
Monocytes Absolute: 1.2 10*3/uL — ABNORMAL HIGH (ref 0.1–1.0)
Monocytes Relative: 14 %
Neutro Abs: 6.5 10*3/uL (ref 1.7–7.7)
Neutrophils Relative %: 74 %
Platelet Count: 216 10*3/uL (ref 150–400)
RBC: 4.86 MIL/uL (ref 4.22–5.81)
RDW: 12.9 % (ref 11.5–15.5)
WBC Count: 8.7 10*3/uL (ref 4.0–10.5)
nRBC: 0 % (ref 0.0–0.2)

## 2019-06-28 MED ORDER — SODIUM CHLORIDE 0.9 % IV SOLN
10.0000 mg/kg | Freq: Once | INTRAVENOUS | Status: AC
  Administered 2019-06-28: 860 mg via INTRAVENOUS
  Filled 2019-06-28: qty 7.2

## 2019-06-28 MED ORDER — SODIUM CHLORIDE 0.9 % IV SOLN
Freq: Once | INTRAVENOUS | Status: AC
  Administered 2019-06-28: 12:00:00 via INTRAVENOUS
  Filled 2019-06-28: qty 250

## 2019-06-28 NOTE — Progress Notes (Signed)
[facility] OFFICE PROGRESS NOTE  [name], MD [address] [place] [address]  DIAGNOSIS: Stage IIIB (T1c, N3, M0)non-small cell lung cancer, adenocarcinoma presented with right upper lobe lung mass in addition to mediastinal and bilateral supraclavicular lymphadenopathy diagnosed in January 2020. PD-L1: 10% Guardant 360 molecular studiesshowed no actionable mutation.  PRIOR THERAPY:  1)Concurrent chemoradiation with weekly carboplatin for AUC of 2 and paclitaxel 45 mg/M2.Status post 6 cycles. Last dose was given on October 10, 2018 with stable disease. 2) Radiation treatment to the enlarging right cervical lymph nodes under the care of Dr. Sondra Come. First treatment 03/06/2019. Last treatment scheduledon9/05/2019  CURRENT THERAPY: 1)Consolidation treatment with immunotherapy with Imfinzi 10 mg/KG every 2 weeks. First dose November 17, 2018. Status post16cycles.  INTERVAL HISTORY: Zollie Ellery 58 y.o. male returns to the clinic for a follow up visit. The patient is feeling well today without any concerning complaints. The patient continues to tolerate treatment with immunotherapy well without any adverse side effects. Denies any fever, chills, night sweats, or weight loss. Denies any chest pain, shortness of breath, cough, or hemoptysis. He is followed closely by pulmonology and has a flutter valve and uses mucinex for his breathing. He states he has been feeling well over the last few weeks and states that he sometimes forgets that he has cancer. Denies any nausea, vomiting, diarrhea, or constipation. Denies any headache or visual changes. Denies any rashes or skin changes. The patient is here today for evaluation prior to starting cycle # 17   MEDICAL HISTORY: Past Medical History:  Diagnosis Date  . Asthma   . Cancer ([facility])   . Diabetes mellitus without complication ([place])   . Hypertension   . Subarachnoid hemorrhage ([facility])     ALLERGIES:  has  No Known Allergies.  MEDICATIONS:  Current Outpatient Medications  Medication Sig Dispense Refill  . amLODipine (NORVASC) 5 MG tablet Take by mouth.    Marland Kitchen atorvastatin (LIPITOR) 20 MG tablet Take by mouth.    . metFORMIN (GLUCOPHAGE) 500 MG tablet Take by mouth.    . prochlorperazine (COMPAZINE) 10 MG tablet Take 1 tablet (10 mg total) by mouth every 6 (six) hours as needed for nausea or vomiting. 30 tablet 0  . Respiratory Therapy Supplies (FLUTTER) DEVI Please use up 10 times daily(4-5 breaths, 4-5 times daily) 1 each 0  . venlafaxine (EFFEXOR) 37.5 MG tablet TAKE 1 TABLET ONCE DAILY.     No current facility-administered medications for this visit.    Facility-Administered Medications Ordered in Other Visits  Medication Dose Route Frequency Provider Last Rate Last Dose  . durvalumab (IMFINZI) 860 mg in sodium chloride 0.9 % 100 mL chemo infusion  10 mg/kg (Treatment Plan Recorded) Intravenous Once Curt Bears, MD        SURGICAL HISTORY:  Past Surgical History:  Procedure Laterality Date  . LEG SURGERY  age 65    left leg, from fracture    REVIEW OF SYSTEMS:   Review of Systems  Constitutional: Negative for appetite change, chills, fatigue, fever and unexpected weight change.  HENT:   Negative for mouth sores, nosebleeds, sore throat and trouble swallowing.   Eyes: Negative for eye problems and icterus.  Respiratory: Negative for cough, hemoptysis, shortness of breath and wheezing.   Cardiovascular: Negative for chest pain and leg swelling.  Gastrointestinal: Negative for abdominal pain, constipation, diarrhea, nausea and vomiting.  Genitourinary: Negative for bladder incontinence, difficulty urinating, dysuria, frequency and hematuria.   Musculoskeletal:  Negative for back pain, gait problem, neck pain and neck stiffness.  Skin: Negative for itching and rash.  Neurological: Negative for dizziness, extremity weakness, gait problem, headaches, light-headedness and seizures.   Hematological: Negative for adenopathy. Does not bruise/bleed easily.  Psychiatric/Behavioral: Negative for confusion, depression and sleep disturbance. The patient is not nervous/anxious.     PHYSICAL EXAMINATION:  Blood pressure 122/88, pulse 90, temperature 98.3 F (36.8 C), temperature source Oral, resp. rate 18, height '5\' 8"'  (1.727 m), weight 192 lb 9.6 oz (87.4 kg), SpO2 99 %.  ECOG PERFORMANCE STATUS: 0 - Asymptomatic  Physical Exam  Constitutional: Oriented to person, place, and time and well-developed, well-nourished, and in no distress.  HENT:  Head: Normocephalic and atraumatic.  Mouth/Throat: Oropharynx is clear and moist. No oropharyngeal exudate.  Eyes: Conjunctivae are normal. Right eye exhibits no discharge. Left eye exhibits no discharge. No scleral icterus.  Neck: Normal range of motion. Neck supple.  Cardiovascular: Normal rate, regular rhythm, normal heart sounds and intact distal pulses.   Pulmonary/Chest: Effort normal and breath sounds normal. No respiratory distress. No wheezes. No rales.  Abdominal: Soft. Bowel sounds are normal. Exhibits no distension and no mass. There is no tenderness.  Musculoskeletal: Normal range of motion. Exhibits no edema.  Lymphadenopathy:    Decrease in size of the right cervical lymph node Neurological: Alert and oriented to person, place, and time. Exhibits normal muscle tone. Gait normal. Coordination normal.  Skin: Skin is warm and dry. No rash noted. Not diaphoretic. No erythema. No pallor.  Psychiatric: Mood, memory and judgment normal.  Vitals reviewed.  LABORATORY DATA: Lab Results  Component Value Date   WBC 8.7 06/28/2019   HGB 14.6 06/28/2019   HCT 42.8 06/28/2019   MCV 88.1 06/28/2019   PLT 216 06/28/2019      Chemistry      Component Value Date/Time   NA 139 06/28/2019 1114   K 4.3 06/28/2019 1114   CL 107 06/28/2019 1114   CO2 23 06/28/2019 1114   BUN 13 06/28/2019 1114   CREATININE 0.84 06/28/2019 1114       Component Value Date/Time   CALCIUM 8.8 (L) 06/28/2019 1114   ALKPHOS 72 06/28/2019 1114   AST 26 06/28/2019 1114   ALT 54 (H) 06/28/2019 1114   BILITOT 0.3 06/28/2019 1114       RADIOGRAPHIC STUDIES:  No results found.   ASSESSMENT/PLAN:  This is a very pleasant 58 year old Caucasian male diagnosed with stage IIIb non-small cell lung cancer, adenocarcinoma.He presented with a right upper lobe lung mass in addition to mediastinal and bilateral supraclavicular lymphadenopathy. He was diagnosed in January 2020. His PDL 1 expression is 10% and he has no actionable mutations.  He underwent a course of concurrent chemoradiation with weekly carboplatin and paclitaxel. He is status post 6 cycles. He tolerated well except for fatigue and odynophagia.   He is currently undergoing consolidation immunotherapy with Imfinzi 10 mg/kg IV every 2 weeks. He is status post16cycles of treatment.He tolerated it well without any adverse side effects.Herecently completedradiation to the enlarging right cervical lymph nodes.His last radiation treatmentwas on9/05/2019.   The patient was seen with Dr. Julien Nordmann today. Labs were reviewed. Recommend that he proceed with cycle #17 today as scheduled.   We will see him back for a follow up visit in 2 weeks for evaluation before starting cycle #18. At his next visit, we will order a restaging CT of his chest and neck.  The patient was advised to call  immediately if he has any concerning symptoms in the interval. The patient voices understanding of current disease status and treatment options and is in agreement with the current care plan. All questions were answered. The patient knows to call the clinic with any problems, questions or concerns. We can certainly see the patient much sooner if necessary  No orders of the defined types were placed in this encounter.     L , PA-C 06/28/19  ADDENDUM: Hematology/Oncology  Attending: I had a face-to-face encounter with the patient today.  I recommended his care plan.  This is a very pleasant 58 years old white male with a stage IIIb non-small cell lung cancer, adenocarcinoma status post concurrent chemoradiation and he is currently undergoing treatment with consolidation immunotherapy with Imfinzi every 2 weeks status post 16 cycles.  The patient is doing fine today with no concerning complaints.  He has been tolerating this treatment well. I recommended for him to proceed with cycle #17 today as planned. I will see him back for follow-up visit in 2 weeks for evaluation before starting cycle #18. The patient was advised to call immediately if he has any concerning symptoms in the interval.  Disclaimer: This note was dictated with voice recognition software. Similar sounding words can inadvertently be transcribed and may be missed upon review. Eilleen Kempf, MD 06/28/19

## 2019-06-28 NOTE — Patient Instructions (Signed)
[facility] Discharge Instructions for Patients Receiving Chemotherapy  Today you received the following chemotherapy agents: Imfinzi.  To help prevent nausea and vomiting after your treatment, we encourage you to take your nausea medication as directed.   If you develop nausea and vomiting that is not controlled by your nausea medication, call the clinic.   BELOW ARE SYMPTOMS THAT SHOULD BE REPORTED IMMEDIATELY:  *FEVER GREATER THAN 100.5 F  *CHILLS WITH OR WITHOUT FEVER  NAUSEA AND VOMITING THAT IS NOT CONTROLLED WITH YOUR NAUSEA MEDICATION  *UNUSUAL SHORTNESS OF BREATH  *UNUSUAL BRUISING OR BLEEDING  TENDERNESS IN MOUTH AND THROAT WITH OR WITHOUT PRESENCE OF ULCERS  *URINARY PROBLEMS  *BOWEL PROBLEMS  UNUSUAL RASH Items with * indicate a potential emergency and should be followed up as soon as possible.  Feel free to call the clinic should you have any questions or concerns. The clinic phone number is [phone].  Please show the CHEMO ALERT CARD at check-in to the Emergency Department and triage nurse.   

## 2019-07-12 ENCOUNTER — Encounter: Payer: Self-pay | Admitting: *Deleted

## 2019-07-13 ENCOUNTER — Other Ambulatory Visit: Payer: Self-pay

## 2019-07-13 ENCOUNTER — Inpatient Hospital Stay: Payer: BC Managed Care – PPO | Attending: Internal Medicine

## 2019-07-13 ENCOUNTER — Inpatient Hospital Stay: Payer: BC Managed Care – PPO

## 2019-07-13 ENCOUNTER — Inpatient Hospital Stay (HOSPITAL_BASED_OUTPATIENT_CLINIC_OR_DEPARTMENT_OTHER): Payer: BC Managed Care – PPO | Admitting: Internal Medicine

## 2019-07-13 ENCOUNTER — Encounter: Payer: Self-pay | Admitting: Internal Medicine

## 2019-07-13 VITALS — BP 128/89 | HR 103 | Temp 97.8°F | Resp 19 | Ht 68.0 in | Wt 193.7 lb

## 2019-07-13 VITALS — HR 97

## 2019-07-13 DIAGNOSIS — C3490 Malignant neoplasm of unspecified part of unspecified bronchus or lung: Secondary | ICD-10-CM | POA: Diagnosis not present

## 2019-07-13 DIAGNOSIS — Z5112 Encounter for antineoplastic immunotherapy: Secondary | ICD-10-CM

## 2019-07-13 DIAGNOSIS — E119 Type 2 diabetes mellitus without complications: Secondary | ICD-10-CM | POA: Diagnosis not present

## 2019-07-13 DIAGNOSIS — C3491 Malignant neoplasm of unspecified part of right bronchus or lung: Secondary | ICD-10-CM

## 2019-07-13 DIAGNOSIS — R5383 Other fatigue: Secondary | ICD-10-CM | POA: Diagnosis not present

## 2019-07-13 DIAGNOSIS — Z7984 Long term (current) use of oral hypoglycemic drugs: Secondary | ICD-10-CM | POA: Insufficient documentation

## 2019-07-13 DIAGNOSIS — Z79899 Other long term (current) drug therapy: Secondary | ICD-10-CM | POA: Insufficient documentation

## 2019-07-13 DIAGNOSIS — J45909 Unspecified asthma, uncomplicated: Secondary | ICD-10-CM | POA: Insufficient documentation

## 2019-07-13 DIAGNOSIS — C3411 Malignant neoplasm of upper lobe, right bronchus or lung: Secondary | ICD-10-CM | POA: Insufficient documentation

## 2019-07-13 DIAGNOSIS — C770 Secondary and unspecified malignant neoplasm of lymph nodes of head, face and neck: Secondary | ICD-10-CM | POA: Diagnosis not present

## 2019-07-13 DIAGNOSIS — I10 Essential (primary) hypertension: Secondary | ICD-10-CM | POA: Insufficient documentation

## 2019-07-13 DIAGNOSIS — R1310 Dysphagia, unspecified: Secondary | ICD-10-CM | POA: Diagnosis not present

## 2019-07-13 LAB — CBC WITH DIFFERENTIAL (CANCER CENTER ONLY)
Abs Immature Granulocytes: 0.04 10*3/uL (ref 0.00–0.07)
Basophils Absolute: 0.1 10*3/uL (ref 0.0–0.1)
Basophils Relative: 1 %
Eosinophils Absolute: 0.3 10*3/uL (ref 0.0–0.5)
Eosinophils Relative: 4 %
HCT: 42.3 % (ref 39.0–52.0)
Hemoglobin: 14.4 g/dL (ref 13.0–17.0)
Immature Granulocytes: 0 %
Lymphocytes Relative: 8 %
Lymphs Abs: 0.8 10*3/uL (ref 0.7–4.0)
MCH: 30.3 pg (ref 26.0–34.0)
MCHC: 34 g/dL (ref 30.0–36.0)
MCV: 89.1 fL (ref 80.0–100.0)
Monocytes Absolute: 1.1 10*3/uL — ABNORMAL HIGH (ref 0.1–1.0)
Monocytes Relative: 12 %
Neutro Abs: 6.8 10*3/uL (ref 1.7–7.7)
Neutrophils Relative %: 75 %
Platelet Count: 188 10*3/uL (ref 150–400)
RBC: 4.75 MIL/uL (ref 4.22–5.81)
RDW: 12.6 % (ref 11.5–15.5)
WBC Count: 9.1 10*3/uL (ref 4.0–10.5)
nRBC: 0 % (ref 0.0–0.2)

## 2019-07-13 LAB — CMP (CANCER CENTER ONLY)
ALT: 33 U/L (ref 0–44)
AST: 22 U/L (ref 15–41)
Albumin: 4 g/dL (ref 3.5–5.0)
Alkaline Phosphatase: 82 U/L (ref 38–126)
Anion gap: 11 (ref 5–15)
BUN: 16 mg/dL (ref 6–20)
CO2: 22 mmol/L (ref 22–32)
Calcium: 8.7 mg/dL — ABNORMAL LOW (ref 8.9–10.3)
Chloride: 107 mmol/L (ref 98–111)
Creatinine: 0.9 mg/dL (ref 0.61–1.24)
GFR, Est AFR Am: >60 mL/min (ref >60)
GFR, Estimated: >60 mL/min (ref >60)
Glucose, Bld: 131 mg/dL — ABNORMAL HIGH (ref 70–99)
Potassium: 3.9 mmol/L (ref 3.5–5.1)
Sodium: 140 mmol/L (ref 135–145)
Total Bilirubin: 0.3 mg/dL (ref 0.3–1.2)
Total Protein: 6.9 g/dL (ref 6.5–8.1)

## 2019-07-13 LAB — TSH: TSH: 2.965 u[IU]/mL (ref 0.320–4.118)

## 2019-07-13 MED ORDER — SODIUM CHLORIDE 0.9 % IV SOLN
Freq: Once | INTRAVENOUS | Status: AC
  Administered 2019-07-13: 13:00:00 via INTRAVENOUS
  Filled 2019-07-13: qty 250

## 2019-07-13 MED ORDER — SODIUM CHLORIDE 0.9 % IV SOLN
10.0000 mg/kg | Freq: Once | INTRAVENOUS | Status: AC
  Administered 2019-07-13: 860 mg via INTRAVENOUS
  Filled 2019-07-13: qty 7.2

## 2019-07-13 NOTE — Progress Notes (Signed)
[facility] Telephone:(336) [phone]   Fax:(336) [phone]  OFFICE PROGRESS NOTE  [name], MD [address] [place] [address]  DIAGNOSIS: stage IIIB (T1c, N3, M0)non-small cell lung cancer, adenocarcinoma presented with right upper lobe lung mass in addition to mediastinal and bilateral supraclavicular lymphadenopathy diagnosed in January 2020. PD-L1: 10% Guardant 360 molecular studies showed no actionable mutation.  PRIOR THERAPY: Concurrent chemoradiation with weekly carboplatin for AUC of 2 and paclitaxel 45 mg/M2.  Status post 6 cycles.  Last dose was given on October 10, 2018 with stable disease.  CURRENT THERAPY:  Consolidation treatment with immunotherapy with Imfinzi 10 mg/KG every 2 weeks.  First dose November 17, 2018.  Status post 17 cycles.  INTERVAL HISTORY: Brian [name] 58 y.o. male returns to the clinic today for follow-up visit.  The patient is feeling fine today with no concerning complaints except for some soreness on the right side of the neck.  He denied having any current chest pain, shortness of breath, cough or hemoptysis.  He denied having any fever or chills.  He has no nausea, vomiting, diarrhea or constipation.  He has no headache or visual changes.  He has been tolerating his treatment with Imfinzi fairly well.  The patient is here today for evaluation before starting cycle #18.  MEDICAL HISTORY: Past Medical History:  Diagnosis Date  . Asthma   . Cancer ([place])   . Diabetes mellitus without complication ([place])   . Hypertension   . Subarachnoid hemorrhage ([facility])     ALLERGIES:  has No Known Allergies.  MEDICATIONS:  Current Outpatient Medications  Medication Sig Dispense Refill  . amLODipine (NORVASC) 5 MG tablet Take by mouth.    Marland Kitchen atorvastatin (LIPITOR) 20 MG tablet Take by mouth.    . metFORMIN (GLUCOPHAGE) 500 MG tablet Take by mouth.    . prochlorperazine (COMPAZINE) 10 MG tablet Take 1 tablet (10 mg total) by  mouth every 6 (six) hours as needed for nausea or vomiting. 30 tablet 0  . Respiratory Therapy Supplies (FLUTTER) DEVI Please use up 10 times daily(4-5 breaths, 4-5 times daily) 1 each 0  . venlafaxine (EFFEXOR) 37.5 MG tablet TAKE 1 TABLET ONCE DAILY.     No current facility-administered medications for this visit.    SURGICAL HISTORY:  Past Surgical History:  Procedure Laterality Date  . LEG SURGERY  age 25    left leg, from fracture    REVIEW OF SYSTEMS:  A comprehensive review of systems was negative except for: Musculoskeletal: positive for neck pain   PHYSICAL EXAMINATION: General appearance: alert, cooperative, fatigued and no distress Head: Normocephalic, without obvious abnormality, atraumatic Neck: no adenopathy, no JVD, supple, symmetrical, trachea midline and thyroid not enlarged, symmetric, no tenderness/mass/nodules Lymph nodes: Cervical, supraclavicular, and axillary nodes normal. Resp: clear to auscultation bilaterally Back: symmetric, no curvature. ROM normal. No CVA tenderness. Cardio: regular rate and rhythm, S1, S2 normal, no murmur, click, rub or gallop GI: soft, non-tender; bowel sounds normal; no masses,  no organomegaly Extremities: extremities normal, atraumatic, no cyanosis or edema  ECOG PERFORMANCE STATUS: 1 - Symptomatic but completely ambulatory  Blood pressure 128/89, pulse (!) 103, temperature 97.8 F (36.6 C), temperature source Temporal, resp. rate 19, height _0  (1.727 m), weight 193 lb 11.2 oz (87.9 kg), SpO2 97 %.  LABORATORY DATA: Lab Results  Component Value Date   WBC 9.1 07/13/2019   HGB 14.4 07/13/2019   HCT 42.3 07/13/2019  MCV 89.1 07/13/2019   PLT 188 07/13/2019      Chemistry      Component Value Date/Time   NA 139 06/28/2019 1114   K 4.3 06/28/2019 1114   CL 107 06/28/2019 1114   CO2 23 06/28/2019 1114   BUN 13 06/28/2019 1114   CREATININE 0.84 06/28/2019 1114      Component Value Date/Time   CALCIUM 8.8 (L)  06/28/2019 1114   ALKPHOS 72 06/28/2019 1114   AST 26 06/28/2019 1114   ALT 54 (H) 06/28/2019 1114   BILITOT 0.3 06/28/2019 1114       RADIOGRAPHIC STUDIES: No results found.  ASSESSMENT AND PLAN: This is a very pleasant 58 years old white male with a stage IIIb non-small cell lung cancer, adenocarcinoma diagnosed in January 2020 with PDL 1 expression of 10% and no actionable mutations on Guardant 360 blood test. The patient underwent a course of concurrent chemoradiation with weekly carboplatin and paclitaxel status post 6 cycles.  He tolerated this course fairly well except for fatigue and odynophagia which significantly improved. The patient is currently undergoing consolidation treatment with Imfinzi 10 mg/KG every 2 weeks status post 17 cycles.  His restaging scan after cycle #6 showed new cluster of mildly enlarged lymph nodes in the right posterior triangle of the neck.  The patient is also undergoing palliative radiotherapy to this area under the care of Dr. Sondra Come. The patient has been tolerating his treatment with immunotherapy fairly well. I recommended for him to proceed with cycle #18 today as planned. I will see him back for follow-up visit in 2 weeks for evaluation after repeating CT scan of the neck and the chest for restaging of his disease. The patient was advised to call immediately if he has any concerning symptoms in the interval. The patient voices understanding of current disease status and treatment options and is in agreement with the current care plan. All questions were answered. The patient knows to call the clinic with any problems, questions or concerns. We can certainly see the patient much sooner if necessary.  Disclaimer: This note was dictated with voice recognition software. Similar sounding words can inadvertently be transcribed and may not be corrected upon review.

## 2019-07-13 NOTE — Patient Instructions (Signed)
[facility] Discharge Instructions for Patients Receiving Immunotherapy.  Today you received the following: Durvalumab.    BELOW ARE SYMPTOMS THAT SHOULD BE REPORTED IMMEDIATELY:  *FEVER GREATER THAN 100.5 F  *CHILLS WITH OR WITHOUT FEVER  NAUSEA AND VOMITING THAT IS NOT CONTROLLED WITH YOUR NAUSEA MEDICATION  *UNUSUAL SHORTNESS OF BREATH  *UNUSUAL BRUISING OR BLEEDING  TENDERNESS IN MOUTH AND THROAT WITH OR WITHOUT PRESENCE OF ULCERS  *URINARY PROBLEMS  *BOWEL PROBLEMS  UNUSUAL RASH Items with * indicate a potential emergency and should be followed up as soon as possible.  Feel free to call the clinic should you have any questions or concerns. The clinic phone number is [phone].  Please show the [place] at check-in to the Emergency Department and triage nurse.

## 2019-07-14 ENCOUNTER — Telehealth: Payer: Self-pay | Admitting: Internal Medicine

## 2019-07-14 NOTE — Telephone Encounter (Signed)
Scheduled per los. Called and left msg. Mailed printout  °

## 2019-07-26 ENCOUNTER — Ambulatory Visit (HOSPITAL_COMMUNITY)
Admission: RE | Admit: 2019-07-26 | Discharge: 2019-07-26 | Disposition: A | Discharge status: Home / Self Care | Payer: BC Managed Care – PPO | Source: Ambulatory Visit | Attending: Internal Medicine | Admitting: Internal Medicine

## 2019-07-26 ENCOUNTER — Other Ambulatory Visit: Payer: Self-pay

## 2019-07-26 DIAGNOSIS — C3411 Malignant neoplasm of upper lobe, right bronchus or lung: Secondary | ICD-10-CM | POA: Diagnosis not present

## 2019-07-26 DIAGNOSIS — C3490 Malignant neoplasm of unspecified part of unspecified bronchus or lung: Secondary | ICD-10-CM | POA: Insufficient documentation

## 2019-07-26 MED ORDER — SODIUM CHLORIDE (PF) 0.9 % IJ SOLN
INTRAMUSCULAR | Status: AC
  Filled 2019-07-26: qty 50

## 2019-07-26 MED ORDER — IOHEXOL 300 MG/ML  SOLN
75.0000 mL | Freq: Once | INTRAMUSCULAR | Status: AC | PRN
  Administered 2019-07-26: 15:00:00 75 mL via INTRAVENOUS

## 2019-07-27 ENCOUNTER — Other Ambulatory Visit: Payer: Self-pay

## 2019-07-27 ENCOUNTER — Encounter: Payer: Self-pay | Admitting: Internal Medicine

## 2019-07-27 ENCOUNTER — Inpatient Hospital Stay: Payer: BC Managed Care – PPO

## 2019-07-27 ENCOUNTER — Inpatient Hospital Stay (HOSPITAL_BASED_OUTPATIENT_CLINIC_OR_DEPARTMENT_OTHER): Payer: BC Managed Care – PPO | Admitting: Internal Medicine

## 2019-07-27 VITALS — BP 113/74 | HR 102 | Temp 98.3°F | Resp 18 | Ht 68.0 in | Wt 195.6 lb

## 2019-07-27 VITALS — HR 97

## 2019-07-27 DIAGNOSIS — C3491 Malignant neoplasm of unspecified part of right bronchus or lung: Secondary | ICD-10-CM | POA: Diagnosis not present

## 2019-07-27 DIAGNOSIS — C770 Secondary and unspecified malignant neoplasm of lymph nodes of head, face and neck: Secondary | ICD-10-CM | POA: Diagnosis not present

## 2019-07-27 DIAGNOSIS — R5383 Other fatigue: Secondary | ICD-10-CM | POA: Diagnosis not present

## 2019-07-27 DIAGNOSIS — Z7984 Long term (current) use of oral hypoglycemic drugs: Secondary | ICD-10-CM | POA: Diagnosis not present

## 2019-07-27 DIAGNOSIS — E119 Type 2 diabetes mellitus without complications: Secondary | ICD-10-CM | POA: Diagnosis not present

## 2019-07-27 DIAGNOSIS — Z5112 Encounter for antineoplastic immunotherapy: Secondary | ICD-10-CM | POA: Diagnosis not present

## 2019-07-27 DIAGNOSIS — Z79899 Other long term (current) drug therapy: Secondary | ICD-10-CM | POA: Diagnosis not present

## 2019-07-27 DIAGNOSIS — C3411 Malignant neoplasm of upper lobe, right bronchus or lung: Secondary | ICD-10-CM | POA: Diagnosis not present

## 2019-07-27 DIAGNOSIS — I10 Essential (primary) hypertension: Secondary | ICD-10-CM | POA: Diagnosis not present

## 2019-07-27 DIAGNOSIS — R1310 Dysphagia, unspecified: Secondary | ICD-10-CM | POA: Diagnosis not present

## 2019-07-27 DIAGNOSIS — J45909 Unspecified asthma, uncomplicated: Secondary | ICD-10-CM | POA: Diagnosis not present

## 2019-07-27 LAB — CMP (CANCER CENTER ONLY)
ALT: 37 U/L (ref 0–44)
AST: 22 U/L (ref 15–41)
Albumin: 4.1 g/dL (ref 3.5–5.0)
Alkaline Phosphatase: 78 U/L (ref 38–126)
Anion gap: 11 (ref 5–15)
BUN: 13 mg/dL (ref 6–20)
CO2: 22 mmol/L (ref 22–32)
Calcium: 8.9 mg/dL (ref 8.9–10.3)
Chloride: 107 mmol/L (ref 98–111)
Creatinine: 0.81 mg/dL (ref 0.61–1.24)
GFR, Est AFR Am: >60 mL/min (ref >60)
GFR, Estimated: >60 mL/min (ref >60)
Glucose, Bld: 80 mg/dL (ref 70–99)
Potassium: 4.2 mmol/L (ref 3.5–5.1)
Sodium: 140 mmol/L (ref 135–145)
Total Bilirubin: 0.3 mg/dL (ref 0.3–1.2)
Total Protein: 6.7 g/dL (ref 6.5–8.1)

## 2019-07-27 LAB — CBC WITH DIFFERENTIAL (CANCER CENTER ONLY)
Abs Immature Granulocytes: 0.03 10*3/uL (ref 0.00–0.07)
Basophils Absolute: 0.1 10*3/uL (ref 0.0–0.1)
Basophils Relative: 1 %
Eosinophils Absolute: 0.3 10*3/uL (ref 0.0–0.5)
Eosinophils Relative: 3 %
HCT: 43.4 % (ref 39.0–52.0)
Hemoglobin: 14.7 g/dL (ref 13.0–17.0)
Immature Granulocytes: 0 %
Lymphocytes Relative: 11 %
Lymphs Abs: 1.1 10*3/uL (ref 0.7–4.0)
MCH: 30.3 pg (ref 26.0–34.0)
MCHC: 33.9 g/dL (ref 30.0–36.0)
MCV: 89.5 fL (ref 80.0–100.0)
Monocytes Absolute: 1.6 10*3/uL — ABNORMAL HIGH (ref 0.1–1.0)
Monocytes Relative: 16 %
Neutro Abs: 6.7 10*3/uL (ref 1.7–7.7)
Neutrophils Relative %: 69 %
Platelet Count: 213 10*3/uL (ref 150–400)
RBC: 4.85 MIL/uL (ref 4.22–5.81)
RDW: 12.7 % (ref 11.5–15.5)
WBC Count: 9.7 10*3/uL (ref 4.0–10.5)
nRBC: 0 % (ref 0.0–0.2)

## 2019-07-27 MED ORDER — SODIUM CHLORIDE 0.9 % IV SOLN
Freq: Once | INTRAVENOUS | Status: AC
  Filled 2019-07-27: qty 250

## 2019-07-27 MED ORDER — SODIUM CHLORIDE 0.9 % IV SOLN
10.0000 mg/kg | Freq: Once | INTRAVENOUS | Status: AC
  Administered 2019-07-27: 860 mg via INTRAVENOUS
  Filled 2019-07-27: qty 7.2

## 2019-07-27 NOTE — Progress Notes (Signed)
[name] Telephone:([phone]   Fax:(336) [phone]  OFFICE PROGRESS NOTE  [name], MD [address] [place] [address]  DIAGNOSIS: stage IIIB (T1c, N3, M0)non-small cell lung cancer, adenocarcinoma presented with right upper lobe lung mass in addition to mediastinal and bilateral supraclavicular lymphadenopathy diagnosed in January 2020. PD-L1: 10% Guardant 360 molecular studies showed no actionable mutation.  PRIOR THERAPY: Concurrent chemoradiation with weekly carboplatin for AUC of 2 and paclitaxel 45 mg/M2.  Status post 6 cycles.  Last dose was given on October 10, 2018 with stable disease.  CURRENT THERAPY:  Consolidation treatment with immunotherapy with Imfinzi 10 mg/KG every 2 weeks.  First dose November 17, 2018.  Status post 18 cycles.  INTERVAL HISTORY: Brian [name] 58 y.o. male returns to the clinic today for follow-up visit.  The patient is feeling fine today with no concerning complaints except for one episode of choking when he was drinking water.  He denied having any current chest pain, shortness of breath, cough or hemoptysis.  He denied having any fever or chills.  He has no nausea, vomiting, diarrhea or constipation.  He denied having any headache or visual changes.  He has no weight loss or night sweats.  He continues to tolerate his consolidation treatment with Imfinzi fairly well.  The patient had repeat CT scan of the neck and the chest performed recently and he is here for evaluation and discussion of his scan results.  MEDICAL HISTORY: Past Medical History:  Diagnosis Date  . Asthma   . Cancer ([place])   . Diabetes mellitus without complication ([place])   . Hypertension   . Subarachnoid hemorrhage ([facility])     ALLERGIES:  has No Known Allergies.  MEDICATIONS:  Current Outpatient Medications  Medication Sig Dispense Refill  . amLODipine (NORVASC) 5 MG tablet Take by mouth.    Marland Kitchen atorvastatin (LIPITOR) 20 MG tablet Take  by mouth.    . metFORMIN (GLUCOPHAGE) 500 MG tablet Take by mouth.    . prochlorperazine (COMPAZINE) 10 MG tablet Take 1 tablet (10 mg total) by mouth every 6 (six) hours as needed for nausea or vomiting. 30 tablet 0  . Respiratory Therapy Supplies (FLUTTER) DEVI Please use up 10 times daily(4-5 breaths, 4-5 times daily) 1 each 0  . venlafaxine (EFFEXOR) 37.5 MG tablet TAKE 1 TABLET ONCE DAILY.     No current facility-administered medications for this visit.    SURGICAL HISTORY:  Past Surgical History:  Procedure Laterality Date  . LEG SURGERY  age 22    left leg, from fracture    REVIEW OF SYSTEMS:  Constitutional: negative Eyes: negative Ears, nose, mouth, throat, and face: negative Respiratory: negative Cardiovascular: negative Gastrointestinal: negative Genitourinary:negative Integument/breast: negative Hematologic/lymphatic: negative Musculoskeletal:negative Neurological: negative Behavioral/Psych: negative Endocrine: negative Allergic/Immunologic: negative   PHYSICAL EXAMINATION: General appearance: alert, cooperative, fatigued and no distress Head: Normocephalic, without obvious abnormality, atraumatic Neck: no adenopathy, no JVD, supple, symmetrical, trachea midline and thyroid not enlarged, symmetric, no tenderness/mass/nodules Lymph nodes: Cervical, supraclavicular, and axillary nodes normal. Resp: clear to auscultation bilaterally Back: symmetric, no curvature. ROM normal. No CVA tenderness. Cardio: regular rate and rhythm, S1, S2 normal, no murmur, click, rub or gallop GI: soft, non-tender; bowel sounds normal; no masses,  no organomegaly Extremities: extremities normal, atraumatic, no cyanosis or edema Neurologic: Alert and oriented X 3, normal strength and tone. Normal symmetric reflexes. Normal coordination and gait  ECOG PERFORMANCE STATUS: 1 - Symptomatic but  completely ambulatory  Blood pressure 113/74, pulse (!) 102, temperature 98.3 F (36.8 C),  temperature source Temporal, resp. rate 18, height '5\' 8"'  (1.727 m), weight 195 lb 9.6 oz (88.7 kg), SpO2 98 %.  LABORATORY DATA: Lab Results  Component Value Date   WBC 9.7 07/27/2019   HGB 14.7 07/27/2019   HCT 43.4 07/27/2019   MCV 89.5 07/27/2019   PLT 213 07/27/2019      Chemistry      Component Value Date/Time   NA 140 07/13/2019 1149   K 3.9 07/13/2019 1149   CL 107 07/13/2019 1149   CO2 22 07/13/2019 1149   BUN 16 07/13/2019 1149   CREATININE 0.90 07/13/2019 1149      Component Value Date/Time   CALCIUM 8.7 (L) 07/13/2019 1149   ALKPHOS 82 07/13/2019 1149   AST 22 07/13/2019 1149   ALT 33 07/13/2019 1149   BILITOT 0.3 07/13/2019 1149       RADIOGRAPHIC STUDIES: CT Soft Tissue Neck W Contrast  Result Date: 07/27/2019 CLINICAL DATA:  Lung cancer, follow-up EXAM: CT NECK WITH CONTRAST TECHNIQUE: Multidetector CT imaging of the neck was performed using the standard protocol following the bolus administration of intravenous contrast. CONTRAST:  56m OMNIPAQUE IOHEXOL 300 MG/ML  SOLN COMPARISON:  04/18/2019 FINDINGS: Pharynx and larynx: Normal.  No mass or swelling. Salivary glands: Parotid and submandibular glands are unremarkable. Thyroid: Unremarkable. Lymph nodes: Decrease in size of cluster of right level 5 lymph nodes largest measures 1.1 cm (previously 1.3 cm). Small right level 3 and level 4 nodes measures stable to minimally increased in size. For example, a 5 mm node on series 2, image 93 previously measured 4 mm. Vascular: Major neck vessels are patent. Limited intracranial: No abnormal enhancement. Visualized orbits: Unremarkable. Mastoids and visualized paranasal sinuses: Aerated Skeleton: Stable mild degenerative changes. Upper chest: Refer to concurrent dedicated chest imaging. Other: None. IMPRESSION: Decrease in cluster of right level 5 lymph nodes. Stable to minimal (1 mm) increase in size of small right level 3 and level 4 nodes. Electronically Signed   By:  PMacy MisM.D.   On: 07/27/2019 09:02   CT Chest W Contrast  Result Date: 07/26/2019 CLINICAL DATA:  Stage IIIB right upper lobe lung adenocarcinoma diagnosed January 2020 status post concurrent chemoradiation therapy with ongoing consolidation immunotherapy. Restaging. EXAM: CT CHEST WITH CONTRAST TECHNIQUE: Multidetector CT imaging of the chest was performed during intravenous contrast administration. CONTRAST:  74mOMNIPAQUE IOHEXOL 300 MG/ML  SOLN COMPARISON:  04/18/2019 chest CT. FINDINGS: Cardiovascular: Normal heart size. No significant pericardial effusion/thickening. Left anterior descending coronary atherosclerosis. Mildly atherosclerotic nonaneurysmal thoracic aorta. Normal caliber pulmonary arteries. No central pulmonary emboli. Mediastinum/Nodes: No discrete thyroid nodules. Unremarkable esophagus. No axillary adenopathy. No pathologically enlarged mediastinal nodes. No discrete hilar adenopathy. Lungs/Pleura: No pneumothorax. No pleural effusion. Stable sharply marginated right perihilar consolidation with associated bronchiectasis, distortion and volume loss, compatible with evolving radiation fibrosis. Redemonstrated are clustered subcentimeter nodular opacities along the posterior (series 6/image 66) and inferior (series 6/image 84) margins of the radiation change, not appreciably changed. No new significant pulmonary nodules. Upper abdomen: Simple 1.2 cm upper left renal cyst. Musculoskeletal:  No aggressive appearing focal osseous lesions. IMPRESSION: 1. Stable right perihilar radiation fibrosis. Nonspecific small clustered nodular foci along the posterior and inferior margins of the radiation fibrosis, not appreciably changed since 04/18/2019 chest CT, warranting continued close chest CT follow-up. 2. No new potential findings of metastatic disease in the chest. Thoracic lymph nodes remain subcentimeter.  Please see the separate concurrent neck CT report for details in the lower neck.  Aortic Atherosclerosis (ICD10-I70.0). Electronically Signed   By: Ilona Sorrel M.D.   On: 07/26/2019 19:49    ASSESSMENT AND PLAN: This is a very pleasant 58 years old white male with a stage IIIb non-small cell lung cancer, adenocarcinoma diagnosed in January 2020 with PDL 1 expression of 10% and no actionable mutations on Guardant 360 blood test. The patient underwent a course of concurrent chemoradiation with weekly carboplatin and paclitaxel status post 6 cycles.  He tolerated this course fairly well except for fatigue and odynophagia which significantly improved. The patient is currently undergoing consolidation treatment with Imfinzi 10 mg/KG every 2 weeks status post 18 cycles.  The patient continues to tolerate his treatment with Imfinzi fairly well.   He had repeat CT scan of the neck and the chest performed recently.  I personally and independently reviewed the scans and discussed the results with the patient today and showed him the images.  His scan showed no concerning findings for disease progression. I recommended for the patient to continue his current treatment with Imfinzi and he will proceed with cycle #19 today. He will come back for follow-up visit in 2 weeks for evaluation before starting cycle #20. The patient was advised to call immediately if he has any concerning symptoms in the interval. The patient voices understanding of current disease status and treatment options and is in agreement with the current care plan. All questions were answered. The patient knows to call the clinic with any problems, questions or concerns. We can certainly see the patient much sooner if necessary.  Disclaimer: This note was dictated with voice recognition software. Similar sounding words can inadvertently be transcribed and may not be corrected upon review.

## 2019-07-27 NOTE — Patient Instructions (Signed)
[facility] Discharge Instructions for Patients Receiving Immunotherapy.  Today you received the following: Durvalumab.    BELOW ARE SYMPTOMS THAT SHOULD BE REPORTED IMMEDIATELY:  *FEVER GREATER THAN 100.5 F  *CHILLS WITH OR WITHOUT FEVER  NAUSEA AND VOMITING THAT IS NOT CONTROLLED WITH YOUR NAUSEA MEDICATION  *UNUSUAL SHORTNESS OF BREATH  *UNUSUAL BRUISING OR BLEEDING  TENDERNESS IN MOUTH AND THROAT WITH OR WITHOUT PRESENCE OF ULCERS  *URINARY PROBLEMS  *BOWEL PROBLEMS  UNUSUAL RASH Items with * indicate a potential emergency and should be followed up as soon as possible.  Feel free to call the clinic should you have any questions or concerns. The clinic phone number is [phone].  Please show the [facility] at check-in to the Emergency Department and triage nurse.

## 2019-08-04 DIAGNOSIS — I82403 Acute embolism and thrombosis of unspecified deep veins of lower extremity, bilateral: Secondary | ICD-10-CM

## 2019-08-04 HISTORY — DX: Acute embolism and thrombosis of unspecified deep veins of lower extremity, bilateral: I82.403

## 2019-08-08 NOTE — Progress Notes (Signed)
[place] OFFICE PROGRESS NOTE  [name], MD [address] [place] [address]  DIAGNOSIS: Stage IIIB (T1c, N3, M0)non-small cell lung cancer, adenocarcinoma presented with right upper lobe lung mass in addition to mediastinal and bilateral supraclavicular lymphadenopathy diagnosed in January 2020. PD-L1: 10% [employer] 360 molecular studiesshowed no actionable mutation  PRIOR THERAPY: 1)Concurrent chemoradiation with weekly carboplatin for AUC of 2 and paclitaxel 45 mg/M2.Status post 6 cycles. Last dose was given on October 10, 2018 with stable disease. 2) Radiation treatment to the enlarging right cervical lymph nodes under the care of Dr. Sondra Come. First treatment 03/06/2019. Last treatment scheduledon9/05/2019  CURRENT THERAPY: 1)Consolidation treatment with immunotherapy with Imfinzi 10 mg/KG every 2 weeks. First dose November 17, 2018. Status post19cycles.  INTERVAL HISTORY: Brian [name] 59 y.o. male returns to the clinic for a follow up visit. The patient is feeling well today without any concerning complaints except he developed a persistent headache for 3-4 days over the weekend. He states he woke up with the headache last Friday. He localizes his headache to be in a band like distribution around his head, being particularly uncomfortable in the occipital region and forehead. He characterizes his headache is being a combination of a achy pain as well as a sharp shooting pain. At its worst he rates his pain as a 7/10. It waxed and waned in severity over 4 days. On Monday, he took tylenol and his headache has not reappeared at this time. He does not have hypertension. He denies associated symptoms such as visual changes, gait changes/balance changes, nausea/vomiting, or seizures. He has a history of a subarachnoid hemorrhage in 2016 which presented as an abrupt onset severe headache in the occipital region.   The patient continues to tolerate treatment  with immunotherapy well without any adverse side effects. Denies any fever, chills, night sweats, or weight loss. Denies any chest pain, shortness of breath, or hemoptysis. He had a cough approximately 3 days ago that resolved with the use of his flutter valve. He is followed closely by pulmonology and has a flutter valve and uses mucinex for his breathing. He denies any known sick contacts. Denies any loss of taste or smell. He denies any sore throat or nasal congestion. Denies any nausea, vomiting, or constipation. He had a few episodes of diarrhea which have resolved at this time without any intervention. On days where he experienced diarrhea, he had one loose stool per day. He denies any melena, abdominal pain, or hematochezia. He had an episode of right sided flank pain that radiated to the groin, which also has resolved at this time. Denies any hematuria or changes with his urinary habits. Denies any history of nephrolithiasis. Denies any rashes or skin changes. The patient is here today for evaluation prior to starting cycle # 20  MEDICAL HISTORY: Past Medical History:  Diagnosis Date  . Asthma   . Cancer ([facility])   . Diabetes mellitus without complication ([facility])   . Hypertension   . Subarachnoid hemorrhage ([facility])     ALLERGIES:  has No Known Allergies.  MEDICATIONS:  Current Outpatient Medications  Medication Sig Dispense Refill  . amLODipine (NORVASC) 5 MG tablet Take by mouth.    Marland Kitchen atorvastatin (LIPITOR) 20 MG tablet Take by mouth.    . metFORMIN (GLUCOPHAGE) 500 MG tablet Take by mouth.    . prochlorperazine (COMPAZINE) 10 MG tablet Take 1 tablet (10 mg total) by mouth every 6 (six) hours as needed for nausea  or vomiting. 30 tablet 0  . Respiratory Therapy Supplies (FLUTTER) DEVI Please use up 10 times daily(4-5 breaths, 4-5 times daily) 1 each 0  . venlafaxine (EFFEXOR) 37.5 MG tablet TAKE 1 TABLET ONCE DAILY.     No current facility-administered medications for this visit.    Facility-Administered Medications Ordered in Other Visits  Medication Dose Route Frequency Provider Last Rate Last Admin  . 0.9 %  sodium chloride infusion   Intravenous Once Curt Bears, MD      . durvalumab [facility]) 860 mg in sodium chloride 0.9 % 100 mL chemo infusion  10 mg/kg (Treatment Plan Recorded) Intravenous Once Curt Bears, MD        SURGICAL HISTORY:  Past Surgical History:  Procedure Laterality Date  . LEG SURGERY  age 89    left leg, from fracture    REVIEW OF SYSTEMS:   Review of Systems  Constitutional: Negative for appetite change, chills, fatigue, fever and unexpected weight change.  HENT: Negative for mouth sores, nosebleeds, sore throat and trouble swallowing.   Eyes: Negative for eye problems and icterus.  Respiratory: Negative for cough (resolved), hemoptysis, shortness of breath and wheezing.  Cardiovascular: Negative for chest pain and leg swelling.  Gastrointestinal: Negative for abdominal pain, constipation, diarrhea (resolved), nausea and vomiting.  Genitourinary: Negative for bladder incontinence, difficulty urinating, dysuria, frequency and hematuria.   Musculoskeletal: Negative for back pain, gait problem, neck pain and neck stiffness.  Skin: Negative for itching and rash.  Neurological: Negative for dizziness, extremity weakness, gait problem, headaches (resolved), light-headedness and seizures.  Hematological: Negative for adenopathy. Does not bruise/bleed easily.  Psychiatric/Behavioral: Negative for confusion, depression and sleep disturbance. The patient is not nervous/anxious.     PHYSICAL EXAMINATION:  Blood pressure 123/82, pulse 99, temperature (!) 97.3 F (36.3 C), temperature source Temporal, resp. rate 18, height '5\' 8"'  (1.727 m), weight 194 lb 3.2 oz (88.1 kg), SpO2 98 %.  ECOG PERFORMANCE STATUS: 1 - Symptomatic but completely ambulatory  Physical Exam  Constitutional: Oriented to person, place, and time and well-developed,  well-nourished, and in no distress.   HENT:  Head: Normocephalic and atraumatic.  Mouth/Throat: Oropharynx is clear and moist. No oropharyngeal exudate.  Eyes: Conjunctivae are normal. Right eye exhibits no discharge. Left eye exhibits no discharge. No scleral icterus.  Neck: Normal range of motion. Neck supple.  Cardiovascular: Normal rate, regular rhythm, normal heart sounds and intact distal pulses.   Pulmonary/Chest: Effort normal and breath sounds normal. No respiratory distress. No wheezes. No rales.  Abdominal: Soft. Bowel sounds are normal. Exhibits no distension and no mass. There is no tenderness.  Musculoskeletal: Normal range of motion. Exhibits no edema.  Lymphadenopathy:    No cervical adenopathy.  Neurological: Alert and oriented to person, place, and time. Exhibits normal muscle tone. Gait normal. Coordination normal. Cranial nerves II-XII grossly intact. Strength equal and symmetric in upper and lower extremities. Skin: Skin is warm and dry. No rash noted. Not diaphoretic. No erythema. No pallor.  Psychiatric: Mood, memory and judgment normal.  Vitals reviewed.  LABORATORY DATA: Lab Results  Component Value Date   WBC 9.2 08/09/2019   HGB 14.5 08/09/2019   HCT 42.3 08/09/2019   MCV 89.6 08/09/2019   PLT 209 08/09/2019      Chemistry      Component Value Date/Time   NA 139 08/09/2019 1108   K 4.2 08/09/2019 1108   CL 107 08/09/2019 1108   CO2 23 08/09/2019 1108   BUN 17 08/09/2019  1108   CREATININE 0.83 08/09/2019 1108      Component Value Date/Time   CALCIUM 8.5 (L) 08/09/2019 1108   ALKPHOS 83 08/09/2019 1108   AST 23 08/09/2019 1108   ALT 34 08/09/2019 1108   BILITOT 0.3 08/09/2019 1108       RADIOGRAPHIC STUDIES:  CT Soft Tissue Neck W Contrast  Result Date: 07/27/2019 CLINICAL DATA:  Lung cancer, follow-up EXAM: CT NECK WITH CONTRAST TECHNIQUE: Multidetector CT imaging of the neck was performed using the standard protocol following the bolus  administration of intravenous contrast. CONTRAST:  35m OMNIPAQUE IOHEXOL 300 MG/ML  SOLN COMPARISON:  04/18/2019 FINDINGS: Pharynx and larynx: Normal.  No mass or swelling. Salivary glands: Parotid and submandibular glands are unremarkable. Thyroid: Unremarkable. Lymph nodes: Decrease in size of cluster of right level 5 lymph nodes largest measures 1.1 cm (previously 1.3 cm). Small right level 3 and level 4 nodes measures stable to minimally increased in size. For example, a 5 mm node on series 2, image 93 previously measured 4 mm. Vascular: Major neck vessels are patent. Limited intracranial: No abnormal enhancement. Visualized orbits: Unremarkable. Mastoids and visualized paranasal sinuses: Aerated Skeleton: Stable mild degenerative changes. Upper chest: Refer to concurrent dedicated chest imaging. Other: None. IMPRESSION: Decrease in cluster of right level 5 lymph nodes. Stable to minimal (1 mm) increase in size of small right level 3 and level 4 nodes. Electronically Signed   By: PMacy MisM.D.   On: 07/27/2019 09:02   CT Chest W Contrast  Result Date: 07/26/2019 CLINICAL DATA:  Stage IIIB right upper lobe lung adenocarcinoma diagnosed January 2020 status post concurrent chemoradiation therapy with ongoing consolidation immunotherapy. Restaging. EXAM: CT CHEST WITH CONTRAST TECHNIQUE: Multidetector CT imaging of the chest was performed during intravenous contrast administration. CONTRAST:  725mOMNIPAQUE IOHEXOL 300 MG/ML  SOLN COMPARISON:  04/18/2019 chest CT. FINDINGS: Cardiovascular: Normal heart size. No significant pericardial effusion/thickening. Left anterior descending coronary atherosclerosis. Mildly atherosclerotic nonaneurysmal thoracic aorta. Normal caliber pulmonary arteries. No central pulmonary emboli. Mediastinum/Nodes: No discrete thyroid nodules. Unremarkable esophagus. No axillary adenopathy. No pathologically enlarged mediastinal nodes. No discrete hilar adenopathy. Lungs/Pleura:  No pneumothorax. No pleural effusion. Stable sharply marginated right perihilar consolidation with associated bronchiectasis, distortion and volume loss, compatible with evolving radiation fibrosis. Redemonstrated are clustered subcentimeter nodular opacities along the posterior (series 6/image 66) and inferior (series 6/image 84) margins of the radiation change, not appreciably changed. No new significant pulmonary nodules. Upper abdomen: Simple 1.2 cm upper left renal cyst. Musculoskeletal:  No aggressive appearing focal osseous lesions. IMPRESSION: 1. Stable right perihilar radiation fibrosis. Nonspecific small clustered nodular foci along the posterior and inferior margins of the radiation fibrosis, not appreciably changed since 04/18/2019 chest CT, warranting continued close chest CT follow-up. 2. No new potential findings of metastatic disease in the chest. Thoracic lymph nodes remain subcentimeter. Please see the separate concurrent neck CT report for details in the lower neck. Aortic Atherosclerosis (ICD10-I70.0). Electronically Signed   By: JaIlona Sorrel.D.   On: 07/26/2019 19:49     ASSESSMENT/PLAN:  This is a very pleasant [age]ear old Caucasian male diagnosed with stage IIIb non-small cell lung cancer, adenocarcinoma.He presented with a right upper lobe lung mass in addition to mediastinal and bilateral supraclavicular lymphadenopathy. He was diagnosed in January 2020. His PDL 1 expression is 10% and he has no actionable mutations.  He underwent a course of concurrent chemoradiation with weekly carboplatin and paclitaxel. He is status post 6 cycles. He tolerated well except  for fatigue and odynophagia.   He is currently undergoing consolidation immunotherapy with Imfinzi 10 mg/kg IV every 2 weeks. He is status post19cycles of treatment.He tolerated it well without any adverse side effects.Herecently completedradiation to the enlarging right cervical lymph nodes.His last radiation  treatmentwas on9/05/2019.  Labs were reviewed. Recommend that he proceed with cycle #20 today as scheduled.   We will see him back for a follow up visit in 2 weeks for evaluation before starting cycle #21.   I discussed his new headache with Dr. Julien Nordmann. Given his current diagnosis of lung cancer, we will arrange for a stat CT of the head to rule out any metastatic disease to the brain or other concerning findings such as bleeding. The patient is not hypertensive today.   The patient was advised to call immediately if he has any concerning symptoms in the interval. The patient voices understanding of current disease status and treatment options and is in agreement with the current care plan. All questions were answered. The patient knows to call the clinic with any problems, questions or concerns. We can certainly see the patient much sooner if necessary    Orders Placed This Encounter  Procedures  . CT Head W Wo Contrast    Standing Status:   Future    Standing Expiration Date:   08/08/2020    Order Specific Question:   ** REASON FOR EXAM (FREE TEXT)    Answer:   Headaches with history of stage 3 Lung cancer as well as history of subarachnoid hemorrhage    Order Specific Question:   If indicated for the ordered procedure, I authorize the administration of contrast media per Radiology protocol    Answer:   Yes    Order Specific Question:   Preferred imaging location?    Answer:   [facility]    Order Specific Question:   Radiology Contrast Protocol - do NOT remove file path    Answer:   [email or web address]     [place], PA-C 08/09/19

## 2019-08-09 ENCOUNTER — Inpatient Hospital Stay: Payer: BC Managed Care – PPO | Attending: Internal Medicine | Admitting: Physician Assistant

## 2019-08-09 ENCOUNTER — Inpatient Hospital Stay: Payer: BC Managed Care – PPO

## 2019-08-09 ENCOUNTER — Telehealth: Payer: Self-pay | Admitting: Physician Assistant

## 2019-08-09 ENCOUNTER — Ambulatory Visit (HOSPITAL_COMMUNITY)
Admission: RE | Admit: 2019-08-09 | Discharge: 2019-08-09 | Disposition: A | Discharge status: Home / Self Care | Payer: BC Managed Care – PPO | Source: Ambulatory Visit | Attending: Physician Assistant | Admitting: Physician Assistant

## 2019-08-09 ENCOUNTER — Other Ambulatory Visit: Payer: Self-pay

## 2019-08-09 VITALS — BP 123/82 | HR 99 | Temp 97.3°F | Resp 18 | Ht 68.0 in | Wt 194.2 lb

## 2019-08-09 DIAGNOSIS — R519 Headache, unspecified: Secondary | ICD-10-CM | POA: Diagnosis not present

## 2019-08-09 DIAGNOSIS — Z7984 Long term (current) use of oral hypoglycemic drugs: Secondary | ICD-10-CM | POA: Diagnosis not present

## 2019-08-09 DIAGNOSIS — Z923 Personal history of irradiation: Secondary | ICD-10-CM | POA: Diagnosis not present

## 2019-08-09 DIAGNOSIS — C3491 Malignant neoplasm of unspecified part of right bronchus or lung: Secondary | ICD-10-CM

## 2019-08-09 DIAGNOSIS — J45909 Unspecified asthma, uncomplicated: Secondary | ICD-10-CM | POA: Diagnosis not present

## 2019-08-09 DIAGNOSIS — I10 Essential (primary) hypertension: Secondary | ICD-10-CM | POA: Diagnosis not present

## 2019-08-09 DIAGNOSIS — Z79899 Other long term (current) drug therapy: Secondary | ICD-10-CM | POA: Diagnosis not present

## 2019-08-09 DIAGNOSIS — Z5112 Encounter for antineoplastic immunotherapy: Secondary | ICD-10-CM

## 2019-08-09 DIAGNOSIS — C3411 Malignant neoplasm of upper lobe, right bronchus or lung: Secondary | ICD-10-CM | POA: Diagnosis not present

## 2019-08-09 DIAGNOSIS — E119 Type 2 diabetes mellitus without complications: Secondary | ICD-10-CM | POA: Insufficient documentation

## 2019-08-09 DIAGNOSIS — C770 Secondary and unspecified malignant neoplasm of lymph nodes of head, face and neck: Secondary | ICD-10-CM | POA: Diagnosis not present

## 2019-08-09 DIAGNOSIS — R42 Dizziness and giddiness: Secondary | ICD-10-CM | POA: Diagnosis not present

## 2019-08-09 LAB — CMP (CANCER CENTER ONLY)
ALT: 34 U/L (ref 0–44)
AST: 23 U/L (ref 15–41)
Albumin: 4.1 g/dL (ref 3.5–5.0)
Alkaline Phosphatase: 83 U/L (ref 38–126)
Anion gap: 9 (ref 5–15)
BUN: 17 mg/dL (ref 6–20)
CO2: 23 mmol/L (ref 22–32)
Calcium: 8.5 mg/dL — ABNORMAL LOW (ref 8.9–10.3)
Chloride: 107 mmol/L (ref 98–111)
Creatinine: 0.83 mg/dL (ref 0.61–1.24)
GFR, Est AFR Am: >60 mL/min (ref >60)
GFR, Estimated: >60 mL/min (ref >60)
Glucose, Bld: 109 mg/dL — ABNORMAL HIGH (ref 70–99)
Potassium: 4.2 mmol/L (ref 3.5–5.1)
Sodium: 139 mmol/L (ref 135–145)
Total Bilirubin: 0.3 mg/dL (ref 0.3–1.2)
Total Protein: 7 g/dL (ref 6.5–8.1)

## 2019-08-09 LAB — CBC WITH DIFFERENTIAL (CANCER CENTER ONLY)
Abs Immature Granulocytes: 0.03 10*3/uL (ref 0.00–0.07)
Basophils Absolute: 0.1 10*3/uL (ref 0.0–0.1)
Basophils Relative: 1 %
Eosinophils Absolute: 0.3 10*3/uL (ref 0.0–0.5)
Eosinophils Relative: 3 %
HCT: 42.3 % (ref 39.0–52.0)
Hemoglobin: 14.5 g/dL (ref 13.0–17.0)
Immature Granulocytes: 0 %
Lymphocytes Relative: 8 %
Lymphs Abs: 0.7 10*3/uL (ref 0.7–4.0)
MCH: 30.7 pg (ref 26.0–34.0)
MCHC: 34.3 g/dL (ref 30.0–36.0)
MCV: 89.6 fL (ref 80.0–100.0)
Monocytes Absolute: 1.2 10*3/uL — ABNORMAL HIGH (ref 0.1–1.0)
Monocytes Relative: 13 %
Neutro Abs: 6.9 10*3/uL (ref 1.7–7.7)
Neutrophils Relative %: 75 %
Platelet Count: 209 10*3/uL (ref 150–400)
RBC: 4.72 MIL/uL (ref 4.22–5.81)
RDW: 12.6 % (ref 11.5–15.5)
WBC Count: 9.2 10*3/uL (ref 4.0–10.5)
nRBC: 0 % (ref 0.0–0.2)

## 2019-08-09 LAB — TSH: TSH: 2.776 u[IU]/mL (ref 0.320–4.118)

## 2019-08-09 MED ORDER — SODIUM CHLORIDE (PF) 0.9 % IJ SOLN
INTRAMUSCULAR | Status: AC
  Filled 2019-08-09: qty 50

## 2019-08-09 MED ORDER — SODIUM CHLORIDE 0.9 % IV SOLN
Freq: Once | INTRAVENOUS | Status: AC
  Filled 2019-08-09: qty 250

## 2019-08-09 MED ORDER — IOHEXOL 300 MG/ML  SOLN
100.0000 mL | Freq: Once | INTRAMUSCULAR | Status: AC | PRN
  Administered 2019-08-09: 75 mL via INTRAVENOUS

## 2019-08-09 MED ORDER — SODIUM CHLORIDE 0.9 % IV SOLN
10.0000 mg/kg | Freq: Once | INTRAVENOUS | Status: AC
  Administered 2019-08-09: 860 mg via INTRAVENOUS
  Filled 2019-08-09: qty 10

## 2019-08-09 NOTE — Telephone Encounter (Signed)
Called the patient to let him know his CT was negative for any acute abnormalities.

## 2019-08-09 NOTE — Patient Instructions (Signed)
[facility] Discharge Instructions for Patients Receiving Immunotherapy.  Today you received the following: Durvalumab.    BELOW ARE SYMPTOMS THAT SHOULD BE REPORTED IMMEDIATELY:  *FEVER GREATER THAN 100.5 F  *CHILLS WITH OR WITHOUT FEVER  NAUSEA AND VOMITING THAT IS NOT CONTROLLED WITH YOUR NAUSEA MEDICATION  *UNUSUAL SHORTNESS OF BREATH  *UNUSUAL BRUISING OR BLEEDING  TENDERNESS IN MOUTH AND THROAT WITH OR WITHOUT PRESENCE OF ULCERS  *URINARY PROBLEMS  *BOWEL PROBLEMS  UNUSUAL RASH Items with * indicate a potential emergency and should be followed up as soon as possible.  Feel free to call the clinic should you have any questions or concerns. The clinic phone number is [phone].  Please show the [place] at check-in to the Emergency Department and triage nurse.

## 2019-08-11 ENCOUNTER — Ambulatory Visit: Payer: BC Managed Care – PPO | Attending: Internal Medicine

## 2019-08-11 ENCOUNTER — Telehealth: Payer: Self-pay | Admitting: Internal Medicine

## 2019-08-11 DIAGNOSIS — Z20822 Contact with and (suspected) exposure to covid-19: Secondary | ICD-10-CM

## 2019-08-11 NOTE — Telephone Encounter (Signed)
Scheduled per los.called, no answer, mailed printout

## 2019-08-13 LAB — NOVEL CORONAVIRUS, NAA: SARS-CoV-2, NAA: NOT DETECTED

## 2019-08-23 NOTE — Progress Notes (Signed)
[facility] OFFICE PROGRESS NOTE  [name], MD [address] [place] [address]  DIAGNOSIS: Stage IIIB (T1c, N3, M0)non-small cell lung cancer, adenocarcinoma presented with right upper lobe lung mass in addition to mediastinal and bilateral supraclavicular lymphadenopathy diagnosed in January 2020. PD-L1: 10% Guardant 360 molecular studiesshowed no actionable mutation  PRIOR THERAPY: 1)Concurrent chemoradiation with weekly carboplatin for AUC of 2 and paclitaxel 45 mg/M2.Status post 6 cycles. Last dose was given on October 10, 2018 with stable disease. 2) Radiation treatment to the enlarging right cervical lymph nodes under the care of Dr. Sondra Come. First treatment 03/06/2019. Last treatment scheduledon9/05/2019  CURRENT THERAPY: 1)Consolidation treatment with immunotherapy with Imfinzi 10 mg/KG every 2 weeks. First dose November 17, 2018. Status post20cycles  INTERVAL HISTORY: Brian [name] 59 y.o. male returns to the clinic for a follow up visit. The patient is feeling well today without any concerning complaints. The patient continues to tolerate treatment with immunotherapy with Imfinzi well without any adverse effects. Denies any fever, chills, night sweats, or weight loss. Denies any chest pain, shortness of breath, cough, or hemoptysis. Denies any nausea, vomiting, diarrhea, or constipation. Denies any headache or visual changes. The patient recently had a brain MRI for evaluation of his headaches, which was fortunately, negative for any acute abnormalities or metastatic disease to the brain. He denies any headaches at this time. Denies any rashes or skin changes. The patient is here today for evaluation prior to starting cycle # 21   MEDICAL HISTORY: Past Medical History:  Diagnosis Date  . Asthma   . Cancer ([place])   . Diabetes mellitus without complication ([place])   . Hypertension   . Subarachnoid hemorrhage ([facility])     ALLERGIES:  has No  Known Allergies.  MEDICATIONS:  Current Outpatient Medications  Medication Sig Dispense Refill  . amLODipine (NORVASC) 5 MG tablet Take by mouth.    Marland Kitchen atorvastatin (LIPITOR) 20 MG tablet Take by mouth.    . metFORMIN (GLUCOPHAGE) 500 MG tablet Take by mouth.    . prochlorperazine (COMPAZINE) 10 MG tablet Take 1 tablet (10 mg total) by mouth every 6 (six) hours as needed for nausea or vomiting. 30 tablet 0  . Respiratory Therapy Supplies (FLUTTER) DEVI Please use up 10 times daily(4-5 breaths, 4-5 times daily) 1 each 0  . venlafaxine (EFFEXOR) 37.5 MG tablet TAKE 1 TABLET ONCE DAILY.     No current facility-administered medications for this visit.   Facility-Administered Medications Ordered in Other Visits  Medication Dose Route Frequency Provider Last Rate Last Admin  . durvalumab (IMFINZI) 860 mg in sodium chloride 0.9 % 100 mL chemo infusion  10 mg/kg (Treatment Plan Recorded) Intravenous Once Curt Bears, MD        SURGICAL HISTORY:  Past Surgical History:  Procedure Laterality Date  . LEG SURGERY  age 18    left leg, from fracture    REVIEW OF SYSTEMS:   Review of Systems  Constitutional: Negative for appetite change, chills, fatigue, fever and unexpected weight change.  HENT:   Negative for mouth sores, nosebleeds, sore throat and trouble swallowing.   Eyes: Negative for eye problems and icterus.  Respiratory: Negative for cough, hemoptysis, shortness of breath and wheezing.   Cardiovascular: Negative for chest pain and leg swelling.  Gastrointestinal: Negative for abdominal pain, constipation, diarrhea, nausea and vomiting.  Genitourinary: Negative for bladder incontinence, difficulty urinating, dysuria, frequency and hematuria.   Musculoskeletal: Negative for back pain, gait problem, neck  pain and neck stiffness.  Skin: Negative for itching and rash.  Neurological: Negative for dizziness, extremity weakness, gait problem, headaches, light-headedness and seizures.   Hematological: Negative for adenopathy. Does not bruise/bleed easily.  Psychiatric/Behavioral: Negative for confusion, depression and sleep disturbance. The patient is not nervous/anxious.     PHYSICAL EXAMINATION:  Blood pressure (!) 127/92, pulse (!) 104, temperature 98 F (36.7 C), resp. rate 17, height _0  (1.727 m), weight 192 lb 14.4 oz (87.5 kg), SpO2 98 %.  ECOG PERFORMANCE STATUS: 0 - Asymptomatic  Physical Exam  Constitutional: Oriented to person, place, and time and well-developed, well-nourished, and in no distress.  HENT:  Head: Normocephalic and atraumatic.  Mouth/Throat: Oropharynx is clear and moist. No oropharyngeal exudate.  Eyes: Conjunctivae are normal. Right eye exhibits no discharge. Left eye exhibits no discharge. No scleral icterus.  Neck: Normal range of motion. Neck supple.  Cardiovascular: Normal rate, regular rhythm, normal heart sounds and intact distal pulses.   Pulmonary/Chest: Effort normal and breath sounds normal. No respiratory distress. No wheezes. No rales.  Abdominal: Soft. Bowel sounds are normal. Exhibits no distension and no mass. There is no tenderness.  Musculoskeletal: Normal range of motion. Exhibits no edema.  Lymphadenopathy:    No cervical adenopathy.  Neurological: Alert and oriented to person, place, and time. Exhibits normal muscle tone. Gait normal. Coordination normal.  Skin: Skin is warm and dry. No rash noted. Not diaphoretic. No erythema. No pallor.  Psychiatric: Mood, memory and judgment normal.  Vitals reviewed.  LABORATORY DATA: Lab Results  Component Value Date   WBC 10.6 (H) 08/24/2019   HGB 14.6 08/24/2019   HCT 43.5 08/24/2019   MCV 89.5 08/24/2019   PLT 228 08/24/2019      Chemistry      Component Value Date/Time   NA 140 08/24/2019 0957   K 4.4 08/24/2019 0957   CL 108 08/24/2019 0957   CO2 23 08/24/2019 0957   BUN 17 08/24/2019 0957   CREATININE 0.83 08/24/2019 0957      Component Value Date/Time    CALCIUM 9.0 08/24/2019 0957   ALKPHOS 93 08/24/2019 0957   AST 22 08/24/2019 0957   ALT 32 08/24/2019 0957   BILITOT 0.3 08/24/2019 0957       RADIOGRAPHIC STUDIES:  CT Head W Wo Contrast  Result Date: 08/09/2019 CLINICAL DATA:  Headaches for 6 days with lightheadedness and dizziness. History of lung cancer and remote subarachnoid hemorrhage. EXAM: CT HEAD WITHOUT AND WITH CONTRAST TECHNIQUE: Contiguous axial images were obtained from the base of the skull through the vertex without and with intravenous contrast CONTRAST:  13m OMNIPAQUE IOHEXOL 300 MG/ML  SOLN COMPARISON:  Head MRI 08/31/2018 FINDINGS: Brain: There is no evidence of acute infarct, intracranial hemorrhage, mass, midline shift, or extra-axial fluid collection. The ventricles and sulci are within normal limits for age. No abnormal enhancement is identified. Vascular: Grossly patent dural venous sinuses. Skull: No fracture or suspicious osseous lesion. Sinuses/Orbits: Visualized paranasal sinuses and mastoid air cells are clear. Orbits are unremarkable. Other: None. IMPRESSION: Negative head CT. No evidence of acute intracranial abnormality or metastatic disease. Electronically Signed   By: ALogan BoresM.D.   On: 08/09/2019 15:40   CT Soft Tissue Neck W Contrast  Result Date: 07/27/2019 CLINICAL DATA:  Lung cancer, follow-up EXAM: CT NECK WITH CONTRAST TECHNIQUE: Multidetector CT imaging of the neck was performed using the standard protocol following the bolus administration of intravenous contrast. CONTRAST:  721mOMNIPAQUE IOHEXOL 300 MG/ML  SOLN COMPARISON:  04/18/2019 FINDINGS: Pharynx and larynx: Normal.  No mass or swelling. Salivary glands: Parotid and submandibular glands are unremarkable. Thyroid: Unremarkable. Lymph nodes: Decrease in size of cluster of right level 5 lymph nodes largest measures 1.1 cm (previously 1.3 cm). Small right level 3 and level 4 nodes measures stable to minimally increased in size. For example, a 5  mm node on series 2, image 93 previously measured 4 mm. Vascular: Major neck vessels are patent. Limited intracranial: No abnormal enhancement. Visualized orbits: Unremarkable. Mastoids and visualized paranasal sinuses: Aerated Skeleton: Stable mild degenerative changes. Upper chest: Refer to concurrent dedicated chest imaging. Other: None. IMPRESSION: Decrease in cluster of right level 5 lymph nodes. Stable to minimal (1 mm) increase in size of small right level 3 and level 4 nodes. Electronically Signed   By: Macy Mis M.D.   On: 07/27/2019 09:02   CT Chest W Contrast  Result Date: 07/26/2019 CLINICAL DATA:  Stage IIIB right upper lobe lung adenocarcinoma diagnosed January 2020 status post concurrent chemoradiation therapy with ongoing consolidation immunotherapy. Restaging. EXAM: CT CHEST WITH CONTRAST TECHNIQUE: Multidetector CT imaging of the chest was performed during intravenous contrast administration. CONTRAST:  59m OMNIPAQUE IOHEXOL 300 MG/ML  SOLN COMPARISON:  04/18/2019 chest CT. FINDINGS: Cardiovascular: Normal heart size. No significant pericardial effusion/thickening. Left anterior descending coronary atherosclerosis. Mildly atherosclerotic nonaneurysmal thoracic aorta. Normal caliber pulmonary arteries. No central pulmonary emboli. Mediastinum/Nodes: No discrete thyroid nodules. Unremarkable esophagus. No axillary adenopathy. No pathologically enlarged mediastinal nodes. No discrete hilar adenopathy. Lungs/Pleura: No pneumothorax. No pleural effusion. Stable sharply marginated right perihilar consolidation with associated bronchiectasis, distortion and volume loss, compatible with evolving radiation fibrosis. Redemonstrated are clustered subcentimeter nodular opacities along the posterior (series 6/image 66) and inferior (series 6/image 84) margins of the radiation change, not appreciably changed. No new significant pulmonary nodules. Upper abdomen: Simple 1.2 cm upper left renal cyst.  Musculoskeletal:  No aggressive appearing focal osseous lesions. IMPRESSION: 1. Stable right perihilar radiation fibrosis. Nonspecific small clustered nodular foci along the posterior and inferior margins of the radiation fibrosis, not appreciably changed since 04/18/2019 chest CT, warranting continued close chest CT follow-up. 2. No new potential findings of metastatic disease in the chest. Thoracic lymph nodes remain subcentimeter. Please see the separate concurrent neck CT report for details in the lower neck. Aortic Atherosclerosis (ICD10-I70.0). Electronically Signed   By: JIlona SorrelM.D.   On: 07/26/2019 19:49     ASSESSMENT/PLAN:  This is a very pleasant 59year old Caucasian male diagnosed with stage IIIb non-small cell lung cancer, adenocarcinoma.He presented with a right upper lobe lung mass in addition to mediastinal and bilateral supraclavicular lymphadenopathy. He was diagnosed in January 2020. His PDL 1 expression is 10% and he has no actionable mutations.  He underwent a course of concurrent chemoradiation with weekly carboplatin and paclitaxel. He is status post 6 cycles. He tolerated well except for fatigue and odynophagia.   He is currently undergoing consolidation immunotherapy with Imfinzi 10 mg/kg IV every 2 weeks. He is status post20cycles of treatment.He tolerated it well without any adverse side effects.Hecompletedradiation to the enlarging right cervical lymph nodes.His last radiation treatmentwas on9/05/2019.  Labs were reviewed. Recommend that he proceed with cycle #21 today as scheduled.   We will see him back for a follow up visit in 2 weeks for evaluation before starting cycle #22.   The patient was advised to call immediately if he has any concerning symptoms in the interval. The patient voices understanding of current  disease status and treatment options and is in agreement with the current care plan. All questions were answered. The patient  knows to call the clinic with any problems, questions or concerns. We can certainly see the patient much sooner if necessary   No orders of the defined types were placed in this encounter.    [place], PA-C 08/24/19

## 2019-08-24 ENCOUNTER — Other Ambulatory Visit: Payer: Self-pay

## 2019-08-24 ENCOUNTER — Inpatient Hospital Stay: Payer: BC Managed Care – PPO

## 2019-08-24 ENCOUNTER — Encounter: Payer: Self-pay | Admitting: Physician Assistant

## 2019-08-24 ENCOUNTER — Inpatient Hospital Stay (HOSPITAL_BASED_OUTPATIENT_CLINIC_OR_DEPARTMENT_OTHER): Payer: BC Managed Care – PPO | Admitting: Physician Assistant

## 2019-08-24 VITALS — BP 127/92 | HR 104 | Temp 98.0°F | Resp 17 | Ht 68.0 in | Wt 192.9 lb

## 2019-08-24 VITALS — HR 98

## 2019-08-24 DIAGNOSIS — C3411 Malignant neoplasm of upper lobe, right bronchus or lung: Secondary | ICD-10-CM | POA: Diagnosis not present

## 2019-08-24 DIAGNOSIS — C3491 Malignant neoplasm of unspecified part of right bronchus or lung: Secondary | ICD-10-CM

## 2019-08-24 DIAGNOSIS — C770 Secondary and unspecified malignant neoplasm of lymph nodes of head, face and neck: Secondary | ICD-10-CM | POA: Diagnosis not present

## 2019-08-24 DIAGNOSIS — I10 Essential (primary) hypertension: Secondary | ICD-10-CM | POA: Diagnosis not present

## 2019-08-24 DIAGNOSIS — Z79899 Other long term (current) drug therapy: Secondary | ICD-10-CM | POA: Diagnosis not present

## 2019-08-24 DIAGNOSIS — Z5112 Encounter for antineoplastic immunotherapy: Secondary | ICD-10-CM | POA: Diagnosis not present

## 2019-08-24 DIAGNOSIS — E119 Type 2 diabetes mellitus without complications: Secondary | ICD-10-CM | POA: Diagnosis not present

## 2019-08-24 DIAGNOSIS — J45909 Unspecified asthma, uncomplicated: Secondary | ICD-10-CM | POA: Diagnosis not present

## 2019-08-24 DIAGNOSIS — Z7984 Long term (current) use of oral hypoglycemic drugs: Secondary | ICD-10-CM | POA: Diagnosis not present

## 2019-08-24 DIAGNOSIS — Z923 Personal history of irradiation: Secondary | ICD-10-CM | POA: Diagnosis not present

## 2019-08-24 LAB — CMP (CANCER CENTER ONLY)
ALT: 32 U/L (ref 0–44)
AST: 22 U/L (ref 15–41)
Albumin: 4 g/dL (ref 3.5–5.0)
Alkaline Phosphatase: 93 U/L (ref 38–126)
Anion gap: 9 (ref 5–15)
BUN: 17 mg/dL (ref 6–20)
CO2: 23 mmol/L (ref 22–32)
Calcium: 9 mg/dL (ref 8.9–10.3)
Chloride: 108 mmol/L (ref 98–111)
Creatinine: 0.83 mg/dL (ref 0.61–1.24)
GFR, Est AFR Am: >60 mL/min (ref >60)
GFR, Estimated: >60 mL/min (ref >60)
Glucose, Bld: 100 mg/dL — ABNORMAL HIGH (ref 70–99)
Potassium: 4.4 mmol/L (ref 3.5–5.1)
Sodium: 140 mmol/L (ref 135–145)
Total Bilirubin: 0.3 mg/dL (ref 0.3–1.2)
Total Protein: 7.1 g/dL (ref 6.5–8.1)

## 2019-08-24 LAB — CBC WITH DIFFERENTIAL (CANCER CENTER ONLY)
Abs Immature Granulocytes: 0.04 10*3/uL (ref 0.00–0.07)
Basophils Absolute: 0.1 10*3/uL (ref 0.0–0.1)
Basophils Relative: 1 %
Eosinophils Absolute: 0.3 10*3/uL (ref 0.0–0.5)
Eosinophils Relative: 3 %
HCT: 43.5 % (ref 39.0–52.0)
Hemoglobin: 14.6 g/dL (ref 13.0–17.0)
Immature Granulocytes: 0 %
Lymphocytes Relative: 8 %
Lymphs Abs: 0.9 10*3/uL (ref 0.7–4.0)
MCH: 30 pg (ref 26.0–34.0)
MCHC: 33.6 g/dL (ref 30.0–36.0)
MCV: 89.5 fL (ref 80.0–100.0)
Monocytes Absolute: 1.4 10*3/uL — ABNORMAL HIGH (ref 0.1–1.0)
Monocytes Relative: 13 %
Neutro Abs: 7.9 10*3/uL — ABNORMAL HIGH (ref 1.7–7.7)
Neutrophils Relative %: 75 %
Platelet Count: 228 10*3/uL (ref 150–400)
RBC: 4.86 MIL/uL (ref 4.22–5.81)
RDW: 12.6 % (ref 11.5–15.5)
WBC Count: 10.6 10*3/uL — ABNORMAL HIGH (ref 4.0–10.5)
nRBC: 0 % (ref 0.0–0.2)

## 2019-08-24 MED ORDER — SODIUM CHLORIDE 0.9 % IV SOLN
10.0000 mg/kg | Freq: Once | INTRAVENOUS | Status: AC
  Administered 2019-08-24: 860 mg via INTRAVENOUS
  Filled 2019-08-24: qty 10

## 2019-08-24 MED ORDER — SODIUM CHLORIDE 0.9 % IV SOLN
Freq: Once | INTRAVENOUS | Status: AC
  Filled 2019-08-24: qty 250

## 2019-08-24 NOTE — Patient Instructions (Signed)
Durvalumab injection What is this medicine? DURVALUMAB (dur VAL ue mab) is a monoclonal antibody. It is used to treat urothelial cancer and lung cancer. This medicine may be used for other purposes; ask your health care provider or pharmacist if you have questions. COMMON BRAND NAME(S): IMFINZI What should I tell my health care provider before I take this medicine? They need to know if you have any of these conditions:  diabetes  immune system problems  infection  inflammatory bowel disease  kidney disease  liver disease  lung or breathing disease  lupus  organ transplant  stomach or intestine problems  thyroid disease  an unusual or allergic reaction to durvalumab, other medicines, foods, dyes, or preservatives  pregnant or trying to get pregnant  breast-feeding How should I use this medicine? This medicine is for infusion into a vein. It is given by a health care professional in a hospital or clinic setting. A special MedGuide will be given to you before each treatment. Be sure to read this information carefully each time. Talk to your pediatrician regarding the use of this medicine in children. Special care may be needed. Overdosage: If you think you have taken too much of this medicine contact a poison control center or emergency room at once. NOTE: This medicine is only for you. Do not share this medicine with others. What if I miss a dose? It is important not to miss your dose. Call your doctor or health care professional if you are unable to keep an appointment. What may interact with this medicine? Interactions have not been studied. This list may not describe all possible interactions. Give your health care provider a list of all the medicines, herbs, non-prescription drugs, or dietary supplements you use. Also tell them if you smoke, drink alcohol, or use illegal drugs. Some items may interact with your medicine. What should I watch for while using this  medicine? This drug may make you feel generally unwell. Continue your course of treatment even though you feel ill unless your doctor tells you to stop. You may need blood work done while you are taking this medicine. Do not become pregnant while taking this medicine or for 3 months after stopping it. Women should inform their doctor if they wish to become pregnant or think they might be pregnant. There is a potential for serious side effects to an unborn child. Talk to your health care professional or pharmacist for more information. Do not breast-feed an infant while taking this medicine or for 3 months after stopping it. What side effects may I notice from receiving this medicine? Side effects that you should report to your doctor or health care professional as soon as possible:  allergic reactions like skin rash, itching or hives, swelling of the face, lips, or tongue  black, tarry stools  bloody or watery diarrhea  breathing problems  change in emotions or moods  change in sex drive  changes in vision  chest pain or chest tightness  chills  confusion  cough  facial flushing  fever  headache  signs and symptoms of high blood sugar such as dizziness; dry mouth; dry skin; fruity breath; nausea; stomach pain; increased hunger or thirst; increased urination  signs and symptoms of liver injury like dark yellow or brown urine; general ill feeling or flu-like symptoms; light-colored stools; loss of appetite; nausea; right upper belly pain; unusually weak or tired; yellowing of the eyes or skin  stomach pain  trouble passing urine or change in  the amount of urine  weight gain or weight loss Side effects that usually do not require medical attention (report these to your doctor or health care professional if they continue or are bothersome):  bone pain  constipation  loss of appetite  muscle pain  nausea  swelling of the ankles, feet, hands  tiredness This list  may not describe all possible side effects. Call your doctor for medical advice about side effects. You may report side effects to FDA at 1-800-FDA-1088. Where should I keep my medicine? This drug is given in a hospital or clinic and will not be stored at home. NOTE: This sheet is a summary. It may not cover all possible information. If you have questions about this medicine, talk to your doctor, pharmacist, or health care provider.  2020 Elsevier/Gold Standard (2016-09-29 19:25:04)

## 2019-08-25 ENCOUNTER — Telehealth: Payer: Self-pay | Admitting: Internal Medicine

## 2019-08-25 NOTE — Telephone Encounter (Signed)
Scheduled per los. Called and left msg about added appts. Mailed printout

## 2019-08-31 DIAGNOSIS — E1165 Type 2 diabetes mellitus with hyperglycemia: Secondary | ICD-10-CM | POA: Diagnosis not present

## 2019-08-31 DIAGNOSIS — F4322 Adjustment disorder with anxiety: Secondary | ICD-10-CM | POA: Diagnosis not present

## 2019-08-31 DIAGNOSIS — I10 Essential (primary) hypertension: Secondary | ICD-10-CM | POA: Diagnosis not present

## 2019-08-31 DIAGNOSIS — E782 Mixed hyperlipidemia: Secondary | ICD-10-CM | POA: Diagnosis not present

## 2019-09-06 NOTE — Progress Notes (Signed)
[facility] OFFICE PROGRESS NOTE  [name], MD [address] [place] [address]  DIAGNOSIS:  Stage IIIB (T1c, N3, M0)non-small cell lung cancer, adenocarcinoma presented with right upper lobe lung mass in addition to mediastinal and bilateral supraclavicular lymphadenopathy diagnosed in January 2020.  PD-L1: 10%  Guardant 360 molecular studiesshowed no actionable mutation  PRIOR THERAPY:  1)Concurrent chemoradiation with weekly carboplatin for AUC of 2 and paclitaxel 45 mg/M2.Status post 6 cycles. Last dose was given on October 10, 2018 with stable disease. 2) Radiation treatment to the enlarging right cervical lymph nodes under the care of Dr. Sondra Come. First treatment 03/06/2019. Last treatment scheduledon9/05/2019  CURRENT THERAPY: Consolidation treatment with immunotherapy with Imfinzi 10 mg/KG every 2 weeks. First dose November 17, 2018. Status post21cycles  INTERVAL HISTORY: Brian [name] 59 y.o. male returns to the clinic for a follow up visit. The patient is feeling well today without any concerning complaints except he endorses more dyspnea on exertion and cough. He has not tried taking anything for cough. The patient had pneumonia last year and states his symptoms may feel similar. The patient continues to tolerate treatment with immunotherapy with Imfinzi well without any adverse effects. Denies any fever, chills, night sweats, or weight loss. Denies any chest pain or hemoptysis. Denies any nausea, vomiting, diarrhea, or constipation. Denies any headache or visual changes. The patient recently had a brain MRI for evaluation of his headaches, which was fortunately, negative for any acute abnormalities or metastatic disease to the brain. He denies any headaches at this time. He feels like the left side of his neck is a little achy which he attributes to how he has slept. Denies any rashes or skin changes. The patient is here today for evaluation prior  to starting cycle # 22  MEDICAL HISTORY: Past Medical History:  Diagnosis Date  . Asthma   . Cancer ([place])   . Diabetes mellitus without complication ([place])   . Hypertension   . Subarachnoid hemorrhage ([facility])     ALLERGIES:  has No Known Allergies.  MEDICATIONS:  Current Outpatient Medications  Medication Sig Dispense Refill  . amLODipine (NORVASC) 5 MG tablet Take by mouth.    Marland Kitchen atorvastatin (LIPITOR) 20 MG tablet Take by mouth.    . metFORMIN (GLUCOPHAGE) 500 MG tablet Take by mouth.    . prochlorperazine (COMPAZINE) 10 MG tablet Take 1 tablet (10 mg total) by mouth every 6 (six) hours as needed for nausea or vomiting. 30 tablet 0  . Respiratory Therapy Supplies (FLUTTER) DEVI Please use up 10 times daily(4-5 breaths, 4-5 times daily) 1 each 0  . venlafaxine (EFFEXOR) 37.5 MG tablet TAKE 1 TABLET ONCE DAILY.     No current facility-administered medications for this visit.    SURGICAL HISTORY:  Past Surgical History:  Procedure Laterality Date  . LEG SURGERY  age 89    left leg, from fracture    REVIEW OF SYSTEMS:   Review of Systems  Constitutional: Negative for appetite change, chills, fatigue, fever and unexpected weight change.  HENT: Negative for mouth sores, nosebleeds, sore throat and trouble swallowing.  Eyes: Negative for eye problems and icterus.  Respiratory: Positive for increased shortness of breath and cough. Negative for hemoptysis and wheezing.   Cardiovascular: Negative for chest pain and leg swelling.  Gastrointestinal: Negative for abdominal pain, constipation, diarrhea, nausea and vomiting.  Genitourinary: Negative for bladder incontinence, difficulty urinating, dysuria, frequency and hematuria.   Musculoskeletal: Positive for neck discomfort.  Negative for back pain, gait problem, and neck stiffness.  Skin: Negative for itching and rash.  Neurological: Negative for dizziness, extremity weakness, gait problem, headaches, light-headedness and seizures.   Hematological: Negative for adenopathy. Does not bruise/bleed easily.  Psychiatric/Behavioral: Negative for confusion, depression and sleep disturbance. The patient is not nervous/anxious.     PHYSICAL EXAMINATION:  Blood pressure (!) 124/92, pulse 96, temperature 97.8 F (36.6 C), temperature source Temporal, resp. rate 20, height '5\' 8"'  (1.727 m), weight 189 lb 12.8 oz (86.1 kg), SpO2 98 %.  ECOG PERFORMANCE STATUS: 1 - Symptomatic but completely ambulatory  Physical Exam  Constitutional: Oriented to person, place, and time and well-developed, well-nourished, and in no distress.  HENT:  Head: Normocephalic and atraumatic.  Mouth/Throat: Oropharynx is clear and moist. No oropharyngeal exudate.  Eyes: Conjunctivae are normal. Right eye exhibits no discharge. Left eye exhibits no discharge. No scleral icterus.  Neck: Normal range of motion. Neck supple.  Cardiovascular: Normal rate, regular rhythm, normal heart sounds and intact distal pulses.   Pulmonary/Chest: Effort normal and breath sounds normal. No respiratory distress. No wheezes. No rales.  Abdominal: Soft. Bowel sounds are normal. Exhibits no distension and no mass. There is no tenderness.  Musculoskeletal: Normal range of motion. Exhibits no edema.  Lymphadenopathy:    No cervical adenopathy.  Neurological: Alert and oriented to person, place, and time. Exhibits normal muscle tone. Gait normal. Coordination normal.  Skin: Skin is warm and dry. No rash noted. Not diaphoretic. No erythema. No pallor.  Psychiatric: Mood, memory and judgment normal.  Vitals reviewed.  LABORATORY DATA: Lab Results  Component Value Date   WBC 9.1 09/07/2019   HGB 14.6 09/07/2019   HCT 43.6 09/07/2019   MCV 90.3 09/07/2019   PLT 190 09/07/2019      Chemistry      Component Value Date/Time   NA 138 09/07/2019 1005   K 4.3 09/07/2019 1005   CL 105 09/07/2019 1005   CO2 24 09/07/2019 1005   BUN 15 09/07/2019 1005   CREATININE 0.84  09/07/2019 1005      Component Value Date/Time   CALCIUM 9.1 09/07/2019 1005   ALKPHOS 89 09/07/2019 1005   AST 24 09/07/2019 1005   ALT 27 09/07/2019 1005   BILITOT 0.6 09/07/2019 1005       RADIOGRAPHIC STUDIES:  CT Head W Wo Contrast  Result Date: 08/09/2019 CLINICAL DATA:  Headaches for 6 days with lightheadedness and dizziness. History of lung cancer and remote subarachnoid hemorrhage. EXAM: CT HEAD WITHOUT AND WITH CONTRAST TECHNIQUE: Contiguous axial images were obtained from the base of the skull through the vertex without and with intravenous contrast CONTRAST:  76m OMNIPAQUE IOHEXOL 300 MG/ML  SOLN COMPARISON:  Head MRI 08/31/2018 FINDINGS: Brain: There is no evidence of acute infarct, intracranial hemorrhage, mass, midline shift, or extra-axial fluid collection. The ventricles and sulci are within normal limits for age. No abnormal enhancement is identified. Vascular: Grossly patent dural venous sinuses. Skull: No fracture or suspicious osseous lesion. Sinuses/Orbits: Visualized paranasal sinuses and mastoid air cells are clear. Orbits are unremarkable. Other: None. IMPRESSION: Negative head CT. No evidence of acute intracranial abnormality or metastatic disease. Electronically Signed   By: ALogan BoresM.D.   On: 08/09/2019 15:40     ASSESSMENT/PLAN:  This is a very pleasant 59year old Caucasian male diagnosed with stage IIIb non-small cell lung cancer, adenocarcinoma.He presented with a right upper lobe lung mass in addition to mediastinal and bilateral supraclavicular lymphadenopathy. He was  diagnosed in January 2020. His PDL 1 expression is 10% and he has no actionable mutations.  He underwent a course of concurrent chemoradiation with weekly carboplatin and paclitaxel. He is status post 6 cycles. He tolerated well except for fatigue and odynophagia.   He is currently undergoing consolidation immunotherapy with Imfinzi 10 mg/kg IV every 2 weeks. He is status  post21cycles of treatment.He tolerated it well without any adverse side effects.Hecompletedradiation to the enlarging right cervical lymph nodes.His last radiation treatmentwas on9/05/2019.  Labs were reviewed. Recommend that he proceed with cycle #22today as scheduled.   Due to his increased dyspnea and cough, we will arrange for a restaging CT scan of the chest to rule out infection, pneumonitis, etc. Patient denies any fevers and no leukocytosis. He will use OTC cough medications for his cough in the meantime.  We will see him back for a follow up visit in 2 weeks for evaluation before starting cycle #23.  The patient was advised to call immediately if he has any concerning symptoms in the interval. The patient voices understanding of current disease status and treatment options and is in agreement with the current care plan. All questions were answered. The patient knows to call the clinic with any problems, questions or concerns. We can certainly see the patient much sooner if necessary Orders Placed This Encounter  Procedures  . CT Chest W Contrast    Standing Status:   Future    Standing Expiration Date:   09/06/2020    Order Specific Question:   ** REASON FOR EXAM (FREE TEXT)    Answer:   Restaging Lung Cancer, patient increasing cough and dyspnea on exertion    Order Specific Question:   If indicated for the ordered procedure, I authorize the administration of contrast media per Radiology protocol    Answer:   Yes    Order Specific Question:   Preferred imaging location?    Answer:   [facility]    Order Specific Question:   Radiology Contrast Protocol - do NOT remove file path    Answer:   [email or web address]     [place], PA-C 09/07/19

## 2019-09-07 ENCOUNTER — Inpatient Hospital Stay: Payer: BC Managed Care – PPO

## 2019-09-07 ENCOUNTER — Encounter: Payer: Self-pay | Admitting: Physician Assistant

## 2019-09-07 ENCOUNTER — Inpatient Hospital Stay: Payer: BC Managed Care – PPO | Attending: Internal Medicine | Admitting: Physician Assistant

## 2019-09-07 ENCOUNTER — Other Ambulatory Visit: Payer: Self-pay

## 2019-09-07 VITALS — BP 124/92 | HR 96 | Temp 97.8°F | Resp 20 | Ht 68.0 in | Wt 189.8 lb

## 2019-09-07 DIAGNOSIS — J45909 Unspecified asthma, uncomplicated: Secondary | ICD-10-CM | POA: Diagnosis not present

## 2019-09-07 DIAGNOSIS — R21 Rash and other nonspecific skin eruption: Secondary | ICD-10-CM | POA: Insufficient documentation

## 2019-09-07 DIAGNOSIS — Z5112 Encounter for antineoplastic immunotherapy: Secondary | ICD-10-CM | POA: Insufficient documentation

## 2019-09-07 DIAGNOSIS — C3491 Malignant neoplasm of unspecified part of right bronchus or lung: Secondary | ICD-10-CM

## 2019-09-07 DIAGNOSIS — I10 Essential (primary) hypertension: Secondary | ICD-10-CM | POA: Insufficient documentation

## 2019-09-07 DIAGNOSIS — E119 Type 2 diabetes mellitus without complications: Secondary | ICD-10-CM | POA: Diagnosis not present

## 2019-09-07 DIAGNOSIS — Z7984 Long term (current) use of oral hypoglycemic drugs: Secondary | ICD-10-CM | POA: Insufficient documentation

## 2019-09-07 DIAGNOSIS — Z79899 Other long term (current) drug therapy: Secondary | ICD-10-CM | POA: Diagnosis not present

## 2019-09-07 DIAGNOSIS — C770 Secondary and unspecified malignant neoplasm of lymph nodes of head, face and neck: Secondary | ICD-10-CM | POA: Diagnosis not present

## 2019-09-07 DIAGNOSIS — R0602 Shortness of breath: Secondary | ICD-10-CM | POA: Insufficient documentation

## 2019-09-07 DIAGNOSIS — K760 Fatty (change of) liver, not elsewhere classified: Secondary | ICD-10-CM | POA: Insufficient documentation

## 2019-09-07 DIAGNOSIS — C3411 Malignant neoplasm of upper lobe, right bronchus or lung: Secondary | ICD-10-CM | POA: Diagnosis not present

## 2019-09-07 LAB — CBC WITH DIFFERENTIAL (CANCER CENTER ONLY)
Abs Immature Granulocytes: 0.04 10*3/uL (ref 0.00–0.07)
Basophils Absolute: 0.1 10*3/uL (ref 0.0–0.1)
Basophils Relative: 1 %
Eosinophils Absolute: 0.3 10*3/uL (ref 0.0–0.5)
Eosinophils Relative: 4 %
HCT: 43.6 % (ref 39.0–52.0)
Hemoglobin: 14.6 g/dL (ref 13.0–17.0)
Immature Granulocytes: 0 %
Lymphocytes Relative: 7 %
Lymphs Abs: 0.6 10*3/uL — ABNORMAL LOW (ref 0.7–4.0)
MCH: 30.2 pg (ref 26.0–34.0)
MCHC: 33.5 g/dL (ref 30.0–36.0)
MCV: 90.3 fL (ref 80.0–100.0)
Monocytes Absolute: 1.2 10*3/uL — ABNORMAL HIGH (ref 0.1–1.0)
Monocytes Relative: 13 %
Neutro Abs: 6.8 10*3/uL (ref 1.7–7.7)
Neutrophils Relative %: 75 %
Platelet Count: 190 10*3/uL (ref 150–400)
RBC: 4.83 MIL/uL (ref 4.22–5.81)
RDW: 12.8 % (ref 11.5–15.5)
WBC Count: 9.1 10*3/uL (ref 4.0–10.5)
nRBC: 0 % (ref 0.0–0.2)

## 2019-09-07 LAB — CMP (CANCER CENTER ONLY)
ALT: 27 U/L (ref 0–44)
AST: 24 U/L (ref 15–41)
Albumin: 3.9 g/dL (ref 3.5–5.0)
Alkaline Phosphatase: 89 U/L (ref 38–126)
Anion gap: 9 (ref 5–15)
BUN: 15 mg/dL (ref 6–20)
CO2: 24 mmol/L (ref 22–32)
Calcium: 9.1 mg/dL (ref 8.9–10.3)
Chloride: 105 mmol/L (ref 98–111)
Creatinine: 0.84 mg/dL (ref 0.61–1.24)
GFR, Est AFR Am: >60 mL/min (ref >60)
GFR, Estimated: >60 mL/min (ref >60)
Glucose, Bld: 101 mg/dL — ABNORMAL HIGH (ref 70–99)
Potassium: 4.3 mmol/L (ref 3.5–5.1)
Sodium: 138 mmol/L (ref 135–145)
Total Bilirubin: 0.6 mg/dL (ref 0.3–1.2)
Total Protein: 6.9 g/dL (ref 6.5–8.1)

## 2019-09-07 LAB — TSH: TSH: 4.006 u[IU]/mL (ref 0.320–4.118)

## 2019-09-07 MED ORDER — SODIUM CHLORIDE 0.9 % IV SOLN
Freq: Once | INTRAVENOUS | Status: AC
  Filled 2019-09-07: qty 250

## 2019-09-07 MED ORDER — SODIUM CHLORIDE 0.9 % IV SOLN
10.0000 mg/kg | Freq: Once | INTRAVENOUS | Status: AC
  Administered 2019-09-07: 860 mg via INTRAVENOUS
  Filled 2019-09-07: qty 10

## 2019-09-07 NOTE — Patient Instructions (Signed)
[facility] Discharge Instructions for Patients Receiving Chemotherapy  Today you received the following chemotherapy agents: durvalumab.  To help prevent nausea and vomiting after your treatment, we encourage you to take your nausea medication as directed.   If you develop nausea and vomiting that is not controlled by your nausea medication, call the clinic.   BELOW ARE SYMPTOMS THAT SHOULD BE REPORTED IMMEDIATELY:  *FEVER GREATER THAN 100.5 F  *CHILLS WITH OR WITHOUT FEVER  NAUSEA AND VOMITING THAT IS NOT CONTROLLED WITH YOUR NAUSEA MEDICATION  *UNUSUAL SHORTNESS OF BREATH  *UNUSUAL BRUISING OR BLEEDING  TENDERNESS IN MOUTH AND THROAT WITH OR WITHOUT PRESENCE OF ULCERS  *URINARY PROBLEMS  *BOWEL PROBLEMS  UNUSUAL RASH Items with * indicate a potential emergency and should be followed up as soon as possible.  Feel free to call the clinic should you have any questions or concerns. The clinic phone number is [phone].  Please show the CHEMO ALERT CARD at check-in to the Emergency Department and triage nurse.   

## 2019-09-08 ENCOUNTER — Other Ambulatory Visit: Payer: Self-pay | Admitting: Physician Assistant

## 2019-09-08 ENCOUNTER — Ambulatory Visit (HOSPITAL_COMMUNITY)
Admission: RE | Admit: 2019-09-08 | Discharge: 2019-09-08 | Disposition: A | Discharge status: Home / Self Care | Payer: BC Managed Care – PPO | Source: Ambulatory Visit | Attending: Physician Assistant | Admitting: Physician Assistant

## 2019-09-08 ENCOUNTER — Telehealth: Payer: Self-pay | Admitting: Emergency Medicine

## 2019-09-08 ENCOUNTER — Ambulatory Visit (HOSPITAL_COMMUNITY): Payer: BC Managed Care – PPO

## 2019-09-08 ENCOUNTER — Encounter (HOSPITAL_COMMUNITY): Payer: Self-pay

## 2019-09-08 ENCOUNTER — Telehealth: Payer: Self-pay | Admitting: Physician Assistant

## 2019-09-08 DIAGNOSIS — C3491 Malignant neoplasm of unspecified part of right bronchus or lung: Secondary | ICD-10-CM | POA: Diagnosis not present

## 2019-09-08 DIAGNOSIS — C3490 Malignant neoplasm of unspecified part of unspecified bronchus or lung: Secondary | ICD-10-CM | POA: Diagnosis not present

## 2019-09-08 DIAGNOSIS — I8290 Acute embolism and thrombosis of unspecified vein: Secondary | ICD-10-CM

## 2019-09-08 MED ORDER — RIVAROXABAN (XARELTO) VTE STARTER PACK (15 & 20 MG): ORAL_TABLET | ORAL | 0 refills | Status: DC

## 2019-09-08 MED ORDER — IOHEXOL 300 MG/ML  SOLN
75.0000 mL | Freq: Once | INTRAMUSCULAR | Status: AC | PRN
  Administered 2019-09-08: 75 mL via INTRAVENOUS

## 2019-09-08 MED ORDER — SODIUM CHLORIDE (PF) 0.9 % IJ SOLN
INTRAMUSCULAR | Status: AC
  Filled 2019-09-08: qty 50

## 2019-09-08 NOTE — Telephone Encounter (Signed)
Received call report results from [place] at [facility] for CT Chest w/contrast.  PA Cassie made aware of results available in chart.

## 2019-09-08 NOTE — Telephone Encounter (Signed)
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 I discussed that I have sent a Xarelto starter pack to his pharmacy. He was advised to avoid NSAIDs or aspirin while taking his anti-coagulant. Discussed signs and symptoms of a PE that warrant emergency room visit. He expressed understanding.

## 2019-09-14 ENCOUNTER — Ambulatory Visit (HOSPITAL_COMMUNITY): Payer: BC Managed Care – PPO

## 2019-09-20 ENCOUNTER — Telehealth: Payer: Self-pay | Admitting: Medical Oncology

## 2019-09-20 ENCOUNTER — Telehealth: Payer: Self-pay | Admitting: Internal Medicine

## 2019-09-20 NOTE — Telephone Encounter (Signed)
Rescheduled 2/18 appt to later time due to inclement weather and delay. Called and left msg. Tried to call multiple times.

## 2019-09-20 NOTE — Telephone Encounter (Signed)
LVM to come in tomorrow at 1000.

## 2019-09-20 NOTE — Progress Notes (Signed)
[facility] [facility] OFFICE PROGRESS NOTE  [name], MD [address] [place] [address]  DIAGNOSIS: Stage IIIB (T1c, N3, M0)non-small cell lung cancer, adenocarcinoma presented with right upper lobe lung mass in addition to mediastinal and bilateral supraclavicular lymphadenopathy diagnosed in January 2020.  PD-L1: 10%  Guardant 360 molecular studiesshowed no actionable mutation  PRIOR THERAPY: 1)Concurrent chemoradiation with weekly carboplatin for AUC of 2 and paclitaxel 45 mg/M2.Status post 6 cycles. Last dose was given on October 10, 2018 with stable disease. 2) Radiation treatment to the enlarging right cervical lymph nodes under the care of Dr. Sondra Come. First treatment 03/06/2019. Last treatment scheduledon9/05/2019  CURRENT THERAPY: Consolidation treatment with immunotherapy with Imfinzi 10 mg/KG every 2 weeks. First dose November 17, 2018. Status post22cycles  INTERVAL HISTORY: Bartolo Montanye 59 y.o. male returns to the clinic for a follow up visit. The patient is feeling well today without any concerning complaints. The patient had a stat CT scan a few weeks ago for the chief complaint of mild worsening of his cough and increased dyspnea on exertion. 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 He was started on Xarelto and he has been tolerating that well without and side effects of bleeding or bruising.   Regarding his last cycle of treatment, he tolerated it well without any adverse side effects except has some mild skin rash on his right neck that is somewhat pruitic. He denies any fever, chills, night sweats, or weight loss. Denies any chest pain or hemoptysis. Denies any nausea, vomiting, diarrhea, or constipation. Denies any headache or visual changes. The patient recently had a brain MRI for evaluation of  his headaches, which was fortunately, negative for any acute abnormalities or metastatic disease to the brain. He notes some muscle/tissue stiffness/firmness on the right side of the neck secondary to radiation to the right cervical lymphadenopathy.The patient is here today for evaluation prior to starting cycle #23.     MEDICAL HISTORY: Past Medical History:  Diagnosis Date  . Asthma   . Cancer ([facility])   . Diabetes mellitus without complication ([facility])   . Hypertension   . Subarachnoid hemorrhage ([facility])     ALLERGIES:  has No Known Allergies.  MEDICATIONS:  Current Outpatient Medications  Medication Sig Dispense Refill  . amLODipine (NORVASC) 5 MG tablet Take by mouth.    Marland Kitchen atorvastatin (LIPITOR) 20 MG tablet Take by mouth.    . metFORMIN (GLUCOPHAGE) 500 MG tablet Take by mouth.    . prochlorperazine (COMPAZINE) 10 MG tablet Take 1 tablet (10 mg total) by mouth every 6 (six) hours as needed for nausea or vomiting. 30 tablet 0  . Respiratory Therapy Supplies (FLUTTER) DEVI Please use up 10 times daily(4-5 breaths, 4-5 times daily) 1 each 0  . Rivaroxaban 15 & 20 MG TBPK Follow package directions: Take one 15m tablet by mouth twice a day. On day 22, switch to one 250mtablet once a day. Take with food. 51 each 0  . venlafaxine (EFFEXOR) 37.5 MG tablet TAKE 1 TABLET ONCE DAILY.     No current facility-administered medications for this visit.   Facility-Administered Medications Ordered in Other Visits  Medication Dose Route Frequency Provider Last Rate Last Admin  . durvalumab (IMFINZI) 860 mg in sodium chloride 0.9 % 100 mL chemo infusion  10 mg/kg (Treatment Plan Recorded) Intravenous Once  Curt Bears, MD        SURGICAL HISTORY:  Past Surgical History:  Procedure Laterality Date  . LEG SURGERY  age 46    left leg, from fracture    REVIEW OF SYSTEMS:   Review of Systems  Constitutional: Negative for appetite change, chills, fatigue, fever and unexpected weight change.   HENT: Negative for mouth sores, nosebleeds, sore throat and trouble swallowing.  Eyes: Negative for eye problems and icterus.  Respiratory: positive for mild DOE. Negative for cough, hemoptysis, and wheezing.  Cardiovascular: Negative for chest pain and leg swelling.  Gastrointestinal: Negative for abdominal pain, constipation, diarrhea, nausea and vomiting.  Genitourinary: Negative for bladder incontinence, difficulty urinating, dysuria, frequency and hematuria.   Musculoskeletal: Positive for right cervical firmness of the tissue. Negative for back pain, gait problem and neck pain.   Skin: Positive for mild skin rash over right neck.  Neurological: Negative for dizziness, extremity weakness, gait problem, headaches, light-headedness and seizures.  Hematological: Negative for adenopathy. Does not bruise/bleed easily.  Psychiatric/Behavioral: Negative for confusion, depression and sleep disturbance. The patient is not nervous/anxious.     PHYSICAL EXAMINATION:  Blood pressure 128/90, pulse 96, temperature 97.8 F (36.6 C), temperature source Temporal, resp. rate 18, height '5\' 8"'  (1.727 m), weight 192 lb 3.2 oz (87.2 kg), SpO2 98 %.  ECOG PERFORMANCE STATUS: 1 - Symptomatic but completely ambulatory  Physical Exam  Constitutional: Oriented to person, place, and time and well-developed, well-nourished, and in no distress.  HENT:  Head: Normocephalic and atraumatic.  Mouth/Throat: Oropharynx is clear and moist. No oropharyngeal exudate.  Eyes: Conjunctivae are normal. Right eye exhibits no discharge. Left eye exhibits no discharge. No scleral icterus.  Neck: Normal range of motion. Neck supple.  Cardiovascular: Normal rate, regular rhythm, normal heart sounds and intact distal pulses.   Pulmonary/Chest: Effort normal and breath sounds normal. No respiratory distress. No wheezes. No rales.  Abdominal: Soft. Bowel sounds are normal. Exhibits no distension and no mass. There is no tenderness.   Musculoskeletal: Normal range of motion. Exhibits no edema.  Lymphadenopathy:    No cervical adenopathy.  Neurological: Alert and oriented to person, place, and time. Exhibits normal muscle tone. Gait normal. Coordination normal.  Skin: Skin is warm and dry. Mild skin rash over right neck. Not diaphoretic. No erythema. No pallor.  Psychiatric: Mood, memory and judgment normal.  Vitals reviewed.  LABORATORY DATA: Lab Results  Component Value Date   WBC 9.6 09/21/2019   HGB 14.3 09/21/2019   HCT 42.8 09/21/2019   MCV 87.9 09/21/2019   PLT 208 09/21/2019      Chemistry      Component Value Date/Time   NA 139 09/21/2019 1100   K 4.2 09/21/2019 1100   CL 108 09/21/2019 1100   CO2 23 09/21/2019 1100   BUN 15 09/21/2019 1100   CREATININE 0.82 09/21/2019 1100      Component Value Date/Time   CALCIUM 8.8 (L) 09/21/2019 1100   ALKPHOS 92 09/21/2019 1100   AST 21 09/21/2019 1100   ALT 34 09/21/2019 1100   BILITOT 0.2 (L) 09/21/2019 1100       RADIOGRAPHIC STUDIES:  CT Chest W Contrast  Result Date: 09/08/2019 CLINICAL DATA:  Restaging lung cancer, cough and dyspnea on exertion EXAM: CT CHEST WITH CONTRAST TECHNIQUE: Multidetector CT imaging of the chest was performed during intravenous contrast administration. CONTRAST:  76m OMNIPAQUE IOHEXOL 300 MG/ML  SOLN COMPARISON:  07/26/2019 FINDINGS: Cardiovascular: There is thrombus within the left  brachiocephalic and central left subclavian vein (series 8, image 30). Normal heart size. Scattered coronary artery calcifications. No pericardial effusion. Mediastinum/Nodes: Unchanged post treatment appearance of soft tissue about the right hilum and an enlarged subcarinal lymph node measuring up to 1.5 x 0.7 cm. Thyroid gland, trachea, and esophagus demonstrate no significant findings. Lungs/Pleura: Unchanged dense, fibrotic post treatment/post radiation consolidation of the perihilar right lung, with unchanged nodularity of the adjacent  superior segment right lower lobe (series 5, image 64) and about the major fissure (series 5, image 83). No pleural effusion or pneumothorax. Upper Abdomen: No acute abnormality. Hepatic steatosis. Musculoskeletal: No chest wall mass or suspicious bone lesions identified. IMPRESSION: 1. Unchanged post treatment/post radiation changes of the perihilar right lung, with unchanged nodularity of the adjacent superior segment right lower lobe and about the major fissure. 2. No acute findings to explain increasing cough and dyspnea. 3. There is central venous thrombus within the left brachiocephalic and central left subclavian veins. 4. Hepatic steatosis. These results will be called to the ordering clinician or representative by the Radiologist Assistant, and communication documented in the PACS or zVision Dashboard. Electronically Signed   By: Eddie Candle M.D.   On: 09/08/2019 14:10     ASSESSMENT/PLAN:  This is a very pleasant 59 year old Caucasian male diagnosed with stage IIIb non-small cell lung cancer, adenocarcinoma.He presented with a right upper lobe lung mass in addition to mediastinal and bilateral supraclavicular lymphadenopathy. He was diagnosed in January 2020. His PDL 1 expression is 10% and he has no actionable mutations.  He underwent a course of concurrent chemoradiation with weekly carboplatin and paclitaxel. He is status post 6 cycles. He tolerated well except for fatigue and odynophagia.   He is currently undergoing consolidation immunotherapy with Imfinzi 10 mg/kg IV every 2 weeks. He is status post22cycles of treatment.He tolerated it well without any adverse side effects except has a very mild skin rash over his right cervical area.Hecompletedradiation to the enlarging right cervical lymph nodes.His last radiation treatmentwas on9/05/2019.  Labs were reviewed. Recommend that he proceed with cycle #23today as scheduled.   We will see him back for a follow up visit in 2  weeks for evaluation before starting cycle #24.   He will continue to take his Xarelto as prescribed.    The patient's scan noted hepatic steatosis. Provided the patient with some information/reading material regarding this.   For the mild skin rash, the patient was advised to use topical hydrocortisone cream. He was advised to contact us if he develops a significant rash.   The patient was advised to call immediately if he has any concerning symptoms in the interval. The patient voices understanding of current disease status and treatment options and is in agreement with the current care plan. All questions were answered. The patient knows to call the clinic with any problems, questions or concerns. We can certainly see the patient much sooner if necessary.   No orders of the defined types were placed in this encounter.    Lilianna Case L Demaria Deeney, PA-C 09/21/19

## 2019-09-21 ENCOUNTER — Inpatient Hospital Stay: Payer: BC Managed Care – PPO

## 2019-09-21 ENCOUNTER — Encounter: Payer: Self-pay | Admitting: Physician Assistant

## 2019-09-21 ENCOUNTER — Inpatient Hospital Stay: Payer: BC Managed Care – PPO | Admitting: Internal Medicine

## 2019-09-21 ENCOUNTER — Other Ambulatory Visit: Payer: Self-pay

## 2019-09-21 ENCOUNTER — Inpatient Hospital Stay (HOSPITAL_BASED_OUTPATIENT_CLINIC_OR_DEPARTMENT_OTHER): Payer: BC Managed Care – PPO | Admitting: Physician Assistant

## 2019-09-21 VITALS — BP 128/90 | HR 96 | Temp 97.8°F | Resp 18 | Ht 68.0 in | Wt 192.2 lb

## 2019-09-21 DIAGNOSIS — C3491 Malignant neoplasm of unspecified part of right bronchus or lung: Secondary | ICD-10-CM

## 2019-09-21 DIAGNOSIS — I8290 Acute embolism and thrombosis of unspecified vein: Secondary | ICD-10-CM | POA: Diagnosis not present

## 2019-09-21 DIAGNOSIS — J45909 Unspecified asthma, uncomplicated: Secondary | ICD-10-CM | POA: Diagnosis not present

## 2019-09-21 DIAGNOSIS — Z5112 Encounter for antineoplastic immunotherapy: Secondary | ICD-10-CM | POA: Diagnosis not present

## 2019-09-21 DIAGNOSIS — C3411 Malignant neoplasm of upper lobe, right bronchus or lung: Secondary | ICD-10-CM | POA: Diagnosis not present

## 2019-09-21 DIAGNOSIS — Z79899 Other long term (current) drug therapy: Secondary | ICD-10-CM | POA: Diagnosis not present

## 2019-09-21 DIAGNOSIS — I10 Essential (primary) hypertension: Secondary | ICD-10-CM | POA: Diagnosis not present

## 2019-09-21 DIAGNOSIS — C770 Secondary and unspecified malignant neoplasm of lymph nodes of head, face and neck: Secondary | ICD-10-CM | POA: Diagnosis not present

## 2019-09-21 DIAGNOSIS — K760 Fatty (change of) liver, not elsewhere classified: Secondary | ICD-10-CM | POA: Diagnosis not present

## 2019-09-21 DIAGNOSIS — E119 Type 2 diabetes mellitus without complications: Secondary | ICD-10-CM | POA: Diagnosis not present

## 2019-09-21 DIAGNOSIS — R21 Rash and other nonspecific skin eruption: Secondary | ICD-10-CM | POA: Diagnosis not present

## 2019-09-21 DIAGNOSIS — Z7984 Long term (current) use of oral hypoglycemic drugs: Secondary | ICD-10-CM | POA: Diagnosis not present

## 2019-09-21 LAB — CMP (CANCER CENTER ONLY)
ALT: 34 U/L (ref 0–44)
AST: 21 U/L (ref 15–41)
Albumin: 3.9 g/dL (ref 3.5–5.0)
Alkaline Phosphatase: 92 U/L (ref 38–126)
Anion gap: 8 (ref 5–15)
BUN: 15 mg/dL (ref 6–20)
CO2: 23 mmol/L (ref 22–32)
Calcium: 8.8 mg/dL — ABNORMAL LOW (ref 8.9–10.3)
Chloride: 108 mmol/L (ref 98–111)
Creatinine: 0.82 mg/dL (ref 0.61–1.24)
GFR, Est AFR Am: >60 mL/min (ref >60)
GFR, Estimated: >60 mL/min (ref >60)
Glucose, Bld: 104 mg/dL — ABNORMAL HIGH (ref 70–99)
Potassium: 4.2 mmol/L (ref 3.5–5.1)
Sodium: 139 mmol/L (ref 135–145)
Total Bilirubin: 0.2 mg/dL — ABNORMAL LOW (ref 0.3–1.2)
Total Protein: 6.9 g/dL (ref 6.5–8.1)

## 2019-09-21 LAB — CBC WITH DIFFERENTIAL (CANCER CENTER ONLY)
Abs Immature Granulocytes: 0.04 10*3/uL (ref 0.00–0.07)
Basophils Absolute: 0.1 10*3/uL (ref 0.0–0.1)
Basophils Relative: 1 %
Eosinophils Absolute: 0.4 10*3/uL (ref 0.0–0.5)
Eosinophils Relative: 4 %
HCT: 42.8 % (ref 39.0–52.0)
Hemoglobin: 14.3 g/dL (ref 13.0–17.0)
Immature Granulocytes: 0 %
Lymphocytes Relative: 10 %
Lymphs Abs: 0.9 10*3/uL (ref 0.7–4.0)
MCH: 29.4 pg (ref 26.0–34.0)
MCHC: 33.4 g/dL (ref 30.0–36.0)
MCV: 87.9 fL (ref 80.0–100.0)
Monocytes Absolute: 1.2 10*3/uL — ABNORMAL HIGH (ref 0.1–1.0)
Monocytes Relative: 13 %
Neutro Abs: 7 10*3/uL (ref 1.7–7.7)
Neutrophils Relative %: 72 %
Platelet Count: 208 10*3/uL (ref 150–400)
RBC: 4.87 MIL/uL (ref 4.22–5.81)
RDW: 12.8 % (ref 11.5–15.5)
WBC Count: 9.6 10*3/uL (ref 4.0–10.5)
nRBC: 0 % (ref 0.0–0.2)

## 2019-09-21 MED ORDER — SODIUM CHLORIDE 0.9 % IV SOLN
Freq: Once | INTRAVENOUS | Status: AC
  Filled 2019-09-21: qty 250

## 2019-09-21 MED ORDER — SODIUM CHLORIDE 0.9 % IV SOLN
10.0000 mg/kg | Freq: Once | INTRAVENOUS | Status: AC
  Administered 2019-09-21: 860 mg via INTRAVENOUS
  Filled 2019-09-21: qty 17.2

## 2019-09-21 NOTE — Patient Instructions (Signed)
Fatty Liver Disease  Fatty liver disease occurs when too much fat has built up in your liver cells. Fatty liver disease is also called hepatic steatosis or steatohepatitis. The liver removes harmful substances from your bloodstream and produces fluids that your body needs. It also helps your body use and store energy from the food you eat. In many cases, fatty liver disease does not cause symptoms or problems. It is often diagnosed when tests are being done for other reasons. However, over time, fatty liver can cause inflammation that may lead to more serious liver problems, such as scarring of the liver (cirrhosis) and liver failure. Fatty liver is associated with insulin resistance, increased body fat, high blood pressure (hypertension), and high cholesterol. These are features of metabolic syndrome and increase your risk for stroke, diabetes, and heart disease. What are the causes? This condition may be caused by:  Drinking too much alcohol.  Poor nutrition.  Obesity.  Cushing's syndrome.  Diabetes.  High cholesterol.  Certain drugs.  Poisons.  Some viral infections.  Pregnancy. What increases the risk? You are more likely to develop this condition if you:  Abuse alcohol.  Are overweight.  Have diabetes.  Have hepatitis.  Have a high triglyceride level.  Are pregnant. What are the signs or symptoms? Fatty liver disease often does not cause symptoms. If symptoms do develop, they can include:  Fatigue.  Weakness.  Weight loss.  Confusion.  Abdominal pain.  Nausea and vomiting.  Yellowing of your skin and the white parts of your eyes (jaundice).  Itchy skin. How is this diagnosed? This condition may be diagnosed by:  A physical exam and medical history.  Blood tests.  Imaging tests, such as an ultrasound, CT scan, or MRI.  A liver biopsy. A small sample of liver tissue is removed using a needle. The sample is then looked at under a microscope. How  is this treated? Fatty liver disease is often caused by other health conditions. Treatment for fatty liver may involve medicines and lifestyle changes to manage conditions such as:  Alcoholism.  High cholesterol.  Diabetes.  Being overweight or obese. Follow these instructions at home:   Do not drink alcohol. If you have trouble quitting, ask your health care provider how to safely quit with the help of medicine or a supervised program. This is important to keep your condition from getting worse.  Eat a healthy diet as told by your health care provider. Ask your health care provider about working with a diet and nutrition specialist (dietitian) to develop an eating plan.  Exercise regularly. This can help you lose weight and control your cholesterol and diabetes. Talk to your health care provider about an exercise plan and which activities are best for you.  Take over-the-counter and prescription medicines only as told by your health care provider.  Keep all follow-up visits as told by your health care provider. This is important. Contact a health care provider if: You have trouble controlling your:  Blood sugar. This is especially important if you have diabetes.  Cholesterol.  Drinking of alcohol. Get help right away if:  You have abdominal pain.  You have jaundice.  You have nausea and vomiting.  You vomit blood or material that looks like coffee grounds.  You have stools that are black, tar-like, or bloody. Summary  Fatty liver disease develops when too much fat builds up in the cells of your liver.  Fatty liver disease often causes no symptoms or problems. However, over   time, fatty liver can cause inflammation that may lead to more serious liver problems, such as scarring of the liver (cirrhosis).  You are more likely to develop this condition if you abuse alcohol, are pregnant, are overweight, have diabetes, have hepatitis, or have high triglyceride  levels.  Contact your health care provider if you have trouble controlling your weight, blood sugar, cholesterol, or drinking of alcohol. This information is not intended to replace advice given to you by your health care provider. Make sure you discuss any questions you have with your health care provider. Document Revised: 07/02/2017 Document Reviewed: 04/28/2017 Elsevier Patient Education  2020 [employer]  

## 2019-09-21 NOTE — Patient Instructions (Signed)
[facility] Discharge Instructions for Patients Receiving Chemotherapy  Today you received the following chemotherapy agents: durvalumab.  To help prevent nausea and vomiting after your treatment, we encourage you to take your nausea medication as directed.   If you develop nausea and vomiting that is not controlled by your nausea medication, call the clinic.   BELOW ARE SYMPTOMS THAT SHOULD BE REPORTED IMMEDIATELY:  *FEVER GREATER THAN 100.5 F  *CHILLS WITH OR WITHOUT FEVER  NAUSEA AND VOMITING THAT IS NOT CONTROLLED WITH YOUR NAUSEA MEDICATION  *UNUSUAL SHORTNESS OF BREATH  *UNUSUAL BRUISING OR BLEEDING  TENDERNESS IN MOUTH AND THROAT WITH OR WITHOUT PRESENCE OF ULCERS  *URINARY PROBLEMS  *BOWEL PROBLEMS  UNUSUAL RASH Items with * indicate a potential emergency and should be followed up as soon as possible.  Feel free to call the clinic should you have any questions or concerns. The clinic phone number is [phone].  Please show the CHEMO ALERT CARD at check-in to the Emergency Department and triage nurse.   

## 2019-09-22 ENCOUNTER — Telehealth: Payer: Self-pay | Admitting: Internal Medicine

## 2019-09-22 NOTE — Telephone Encounter (Signed)
Scheduled per los. Called and left msg. Mailed printout  °

## 2019-09-28 ENCOUNTER — Telehealth: Payer: Self-pay | Admitting: Medical Oncology

## 2019-09-28 ENCOUNTER — Other Ambulatory Visit: Payer: Self-pay | Admitting: Internal Medicine

## 2019-09-28 MED ORDER — RIVAROXABAN 20 MG PO TABS: 20.0000 mg | ORAL_TABLET | Freq: Every day | ORAL | 2 refills | Status: DC

## 2019-09-28 NOTE — Telephone Encounter (Signed)
Requests xarelto refill. Looks like he completed starter pack.

## 2019-10-03 NOTE — Progress Notes (Signed)
[facility] OFFICE PROGRESS NOTE  [name], MD [address] [place] [address]  DIAGNOSIS: Stage IIIB (T1c, N3, M0)non-small cell lung cancer, adenocarcinoma presented with right upper lobe lung mass in addition to mediastinal and bilateral supraclavicular lymphadenopathy diagnosed in January 2020.  PD-L1: 10%  Guardant 360 molecular studiesshowed no actionable mutation  PRIOR THERAPY: 1)Concurrent chemoradiation with weekly carboplatin for AUC of 2 and paclitaxel 45 mg/M2.Status post 6 cycles. Last dose was given on October 10, 2018 with stable disease. 2) Radiation treatment to the enlarging right cervical lymph nodes under the care of Dr. Sondra Come. First treatment 03/06/2019. Last treatment scheduledon9/05/2019  CURRENT THERAPY: Consolidation treatment with immunotherapy with Imfinzi 10 mg/KG every 2 weeks. First dose November 17, 2018. Status post23cycles  INTERVAL HISTORY: Brian [name] 59 y.o. male returns to the clinic for a follow up visit. The patient is feeling well today without any concerning complaints. He is scheduled for his COVID-19 vaccine in a few days. The patient continues to tolerate treatment with imfinzi well without any adverse effects. Denies any fever, chills, night sweats, or weight loss. Denies any chest pain, shortness of breath, cough, or hemoptysis. Denies any nausea, vomiting, diarrhea, or constipation. Denies any headache or visual changes. He continues to have a mild skin rash round his right neck. The patient is here today for evaluation prior to starting cycle # 24   MEDICAL HISTORY: Past Medical History:  Diagnosis Date  . Asthma   . Cancer ([place])   . Diabetes mellitus without complication ([place])   . Hypertension   . Subarachnoid hemorrhage ([facility])     ALLERGIES:  has No Known Allergies.  MEDICATIONS:  Current Outpatient Medications  Medication Sig Dispense Refill  . amLODipine (NORVASC) 5 MG tablet Take by  mouth.    Marland Kitchen atorvastatin (LIPITOR) 20 MG tablet Take by mouth.    . metFORMIN (GLUCOPHAGE) 500 MG tablet Take by mouth.    . prochlorperazine (COMPAZINE) 10 MG tablet Take 1 tablet (10 mg total) by mouth every 6 (six) hours as needed for nausea or vomiting. 30 tablet 0  . Respiratory Therapy Supplies (FLUTTER) DEVI Please use up 10 times daily(4-5 breaths, 4-5 times daily) 1 each 0  . rivaroxaban (XARELTO) 20 MG TABS tablet Take 1 tablet (20 mg total) by mouth daily with supper. 30 tablet 2  . venlafaxine (EFFEXOR) 37.5 MG tablet TAKE 1 TABLET ONCE DAILY.     No current facility-administered medications for this visit.    SURGICAL HISTORY:  Past Surgical History:  Procedure Laterality Date  . LEG SURGERY  age 9    left leg, from fracture    REVIEW OF SYSTEMS:   Review of Systems  Constitutional: Negative for appetite change, chills, fatigue, fever and unexpected weight change.  HENT: Negative for mouth sores, nosebleeds, sore throat and trouble swallowing.   Eyes: Negative for eye problems and icterus.  Respiratory: Negative for cough, hemoptysis, shortness of breath and wheezing.   Cardiovascular: Negative for chest pain and leg swelling.  Gastrointestinal: Negative for abdominal pain, constipation, diarrhea, nausea and vomiting.  Genitourinary: Negative for bladder incontinence, difficulty urinating, dysuria, frequency and hematuria.   Musculoskeletal: Negative for back pain, gait problem, neck pain and neck stiffness.  Skin: Mild skin rash around right neck.  Neurological: Negative for dizziness, extremity weakness, gait problem, headaches, light-headedness and seizures.  Hematological: Negative for adenopathy. Does not bruise/bleed easily.  Psychiatric/Behavioral: Negative for confusion, depression and sleep disturbance. The  patient is not nervous/anxious.     PHYSICAL EXAMINATION:  Blood pressure 139/88, pulse 92, temperature 97.8 F (36.6 C), temperature source Temporal,  resp. rate 18, height '5\' 8"'  (1.727 m), weight 189 lb 9.6 oz (86 kg), SpO2 98 %.  ECOG PERFORMANCE STATUS: 1 - Symptomatic but completely ambulatory  Physical Exam  Constitutional: Oriented to person, place, and time and well-developed, well-nourished, and in no distress.  HENT:  Head: Normocephalic and atraumatic.  Mouth/Throat: Oropharynx is clear and moist. No oropharyngeal exudate.  Eyes: Conjunctivae are normal. Right eye exhibits no discharge. Left eye exhibits no discharge. No scleral icterus.  Neck: Normal range of motion. Neck supple.  Cardiovascular: Normal rate, regular rhythm, normal heart sounds and intact distal pulses.   Pulmonary/Chest: Effort normal and breath sounds normal. No respiratory distress. No wheezes. No rales.  Abdominal: Soft. Bowel sounds are normal. Exhibits no distension and no mass. There is no tenderness.  Musculoskeletal: Normal range of motion. Exhibits no edema.  Lymphadenopathy:    No cervical adenopathy.  Neurological: Alert and oriented to person, place, and time. Exhibits normal muscle tone. Gait normal. Coordination normal.  Skin: Mild raised skin rash R neck.  Skin is warm and dry.  Not diaphoretic. No erythema. No pallor.  Psychiatric: Mood, memory and judgment normal.  Vitals reviewed.  LABORATORY DATA: Lab Results  Component Value Date   WBC 9.4 10/05/2019   HGB 14.7 10/05/2019   HCT 42.9 10/05/2019   MCV 88.8 10/05/2019   PLT 206 10/05/2019      Chemistry      Component Value Date/Time   NA 137 10/05/2019 0838   K 4.2 10/05/2019 0838   CL 107 10/05/2019 0838   CO2 23 10/05/2019 0838   BUN 14 10/05/2019 0838   CREATININE 0.80 10/05/2019 0838      Component Value Date/Time   CALCIUM 9.1 10/05/2019 0838   ALKPHOS 102 10/05/2019 0838   AST 23 10/05/2019 0838   ALT 28 10/05/2019 0838   BILITOT 0.3 10/05/2019 0838       RADIOGRAPHIC STUDIES:  CT Chest W Contrast  Result Date: 09/08/2019 CLINICAL DATA:  Restaging lung  cancer, cough and dyspnea on exertion EXAM: CT CHEST WITH CONTRAST TECHNIQUE: Multidetector CT imaging of the chest was performed during intravenous contrast administration. CONTRAST:  57m OMNIPAQUE IOHEXOL 300 MG/ML  SOLN COMPARISON:  07/26/2019 FINDINGS: Cardiovascular: There is thrombus within the left brachiocephalic and central left subclavian vein (series 8, image 30). Normal heart size. Scattered coronary artery calcifications. No pericardial effusion. Mediastinum/Nodes: Unchanged post treatment appearance of soft tissue about the right hilum and an enlarged subcarinal lymph node measuring up to 1.5 x 0.7 cm. Thyroid gland, trachea, and esophagus demonstrate no significant findings. Lungs/Pleura: Unchanged dense, fibrotic post treatment/post radiation consolidation of the perihilar right lung, with unchanged nodularity of the adjacent superior segment right lower lobe (series 5, image 64) and about the major fissure (series 5, image 83). No pleural effusion or pneumothorax. Upper Abdomen: No acute abnormality. Hepatic steatosis. Musculoskeletal: No chest wall mass or suspicious bone lesions identified. IMPRESSION: 1. Unchanged post treatment/post radiation changes of the perihilar right lung, with unchanged nodularity of the adjacent superior segment right lower lobe and about the major fissure. 2. No acute findings to explain increasing cough and dyspnea. 3. There is central venous thrombus within the left brachiocephalic and central left subclavian veins. 4. Hepatic steatosis. These results will be called to the ordering clinician or representative by the Radiologist Assistant, and  communication documented in the PACS or zVision Dashboard. Electronically Signed   By: Eddie Candle M.D.   On: 09/08/2019 14:10     ASSESSMENT/PLAN:  This is a very pleasant 59 year old Caucasian male diagnosed with stage IIIb non-small cell lung cancer, adenocarcinoma.He presented with a right upper lobe lung mass in  addition to mediastinal and bilateral supraclavicular lymphadenopathy. He was diagnosed in January 2020. His PDL 1 expression is 10% and he has no actionable mutations.  He underwent a course of concurrent chemoradiation with weekly carboplatin and paclitaxel. He is status post 6 cycles. He tolerated well except for fatigue and odynophagia.   He is currently undergoing consolidation immunotherapy with Imfinzi 10 mg/kg IV every 2 weeks. He is status post23cycles of treatment.He tolerated it well without any adverse side effects except has a very mild skin rash over his right cervical area.Hecompletedradiation to the enlarging right cervical lymph nodes.His last radiation treatmentwas on9/05/2019.  Labs were reviewed. Recommend that he proceed with cycle #24today as scheduled.  We will see him back for a follow up visit in 2 weeks for evaluation before starting cycle #25.   He will continue to take his Xarelto as prescribed.   The patient was advised to call immediately if he has any concerning symptoms in the interval. The patient voices understanding of current disease status and treatment options and is in agreement with the current care plan. All questions were answered. The patient knows to call the clinic with any problems, questions or concerns. We can certainly see the patient much sooner if necessary   No orders of the defined types were placed in this encounter.    Dorrie Cocuzza L Tarius Stangelo, PA-C 10/05/19

## 2019-10-05 ENCOUNTER — Inpatient Hospital Stay: Payer: BC Managed Care – PPO

## 2019-10-05 ENCOUNTER — Inpatient Hospital Stay: Payer: BC Managed Care – PPO | Attending: Internal Medicine | Admitting: Physician Assistant

## 2019-10-05 ENCOUNTER — Other Ambulatory Visit: Payer: Self-pay

## 2019-10-05 VITALS — BP 139/88 | HR 92 | Temp 97.8°F | Resp 18 | Ht 68.0 in | Wt 189.6 lb

## 2019-10-05 DIAGNOSIS — I10 Essential (primary) hypertension: Secondary | ICD-10-CM | POA: Insufficient documentation

## 2019-10-05 DIAGNOSIS — C3491 Malignant neoplasm of unspecified part of right bronchus or lung: Secondary | ICD-10-CM

## 2019-10-05 DIAGNOSIS — J45909 Unspecified asthma, uncomplicated: Secondary | ICD-10-CM | POA: Insufficient documentation

## 2019-10-05 DIAGNOSIS — Z7901 Long term (current) use of anticoagulants: Secondary | ICD-10-CM | POA: Insufficient documentation

## 2019-10-05 DIAGNOSIS — C3411 Malignant neoplasm of upper lobe, right bronchus or lung: Secondary | ICD-10-CM | POA: Insufficient documentation

## 2019-10-05 DIAGNOSIS — Z5112 Encounter for antineoplastic immunotherapy: Secondary | ICD-10-CM | POA: Diagnosis not present

## 2019-10-05 DIAGNOSIS — C770 Secondary and unspecified malignant neoplasm of lymph nodes of head, face and neck: Secondary | ICD-10-CM | POA: Diagnosis not present

## 2019-10-05 DIAGNOSIS — Z79899 Other long term (current) drug therapy: Secondary | ICD-10-CM | POA: Insufficient documentation

## 2019-10-05 DIAGNOSIS — E119 Type 2 diabetes mellitus without complications: Secondary | ICD-10-CM | POA: Diagnosis not present

## 2019-10-05 DIAGNOSIS — Z7984 Long term (current) use of oral hypoglycemic drugs: Secondary | ICD-10-CM | POA: Diagnosis not present

## 2019-10-05 DIAGNOSIS — R1310 Dysphagia, unspecified: Secondary | ICD-10-CM | POA: Diagnosis not present

## 2019-10-05 LAB — CBC WITH DIFFERENTIAL (CANCER CENTER ONLY)
Abs Immature Granulocytes: 0.03 10*3/uL (ref 0.00–0.07)
Basophils Absolute: 0.1 10*3/uL (ref 0.0–0.1)
Basophils Relative: 1 %
Eosinophils Absolute: 0.3 10*3/uL (ref 0.0–0.5)
Eosinophils Relative: 4 %
HCT: 42.9 % (ref 39.0–52.0)
Hemoglobin: 14.7 g/dL (ref 13.0–17.0)
Immature Granulocytes: 0 %
Lymphocytes Relative: 6 %
Lymphs Abs: 0.5 10*3/uL — ABNORMAL LOW (ref 0.7–4.0)
MCH: 30.4 pg (ref 26.0–34.0)
MCHC: 34.3 g/dL (ref 30.0–36.0)
MCV: 88.8 fL (ref 80.0–100.0)
Monocytes Absolute: 1.2 10*3/uL — ABNORMAL HIGH (ref 0.1–1.0)
Monocytes Relative: 13 %
Neutro Abs: 7.2 10*3/uL (ref 1.7–7.7)
Neutrophils Relative %: 76 %
Platelet Count: 206 10*3/uL (ref 150–400)
RBC: 4.83 MIL/uL (ref 4.22–5.81)
RDW: 12.8 % (ref 11.5–15.5)
WBC Count: 9.4 10*3/uL (ref 4.0–10.5)
nRBC: 0 % (ref 0.0–0.2)

## 2019-10-05 LAB — CMP (CANCER CENTER ONLY)
ALT: 28 U/L (ref 0–44)
AST: 23 U/L (ref 15–41)
Albumin: 3.8 g/dL (ref 3.5–5.0)
Alkaline Phosphatase: 102 U/L (ref 38–126)
Anion gap: 7 (ref 5–15)
BUN: 14 mg/dL (ref 6–20)
CO2: 23 mmol/L (ref 22–32)
Calcium: 9.1 mg/dL (ref 8.9–10.3)
Chloride: 107 mmol/L (ref 98–111)
Creatinine: 0.8 mg/dL (ref 0.61–1.24)
GFR, Est AFR Am: >60 mL/min (ref >60)
GFR, Estimated: >60 mL/min (ref >60)
Glucose, Bld: 107 mg/dL — ABNORMAL HIGH (ref 70–99)
Potassium: 4.2 mmol/L (ref 3.5–5.1)
Sodium: 137 mmol/L (ref 135–145)
Total Bilirubin: 0.3 mg/dL (ref 0.3–1.2)
Total Protein: 6.6 g/dL (ref 6.5–8.1)

## 2019-10-05 LAB — TSH: TSH: 4.298 u[IU]/mL — ABNORMAL HIGH (ref 0.320–4.118)

## 2019-10-05 MED ORDER — SODIUM CHLORIDE 0.9 % IV SOLN
10.0000 mg/kg | Freq: Once | INTRAVENOUS | Status: AC
  Administered 2019-10-05: 860 mg via INTRAVENOUS
  Filled 2019-10-05: qty 10

## 2019-10-05 MED ORDER — SODIUM CHLORIDE 0.9 % IV SOLN
Freq: Once | INTRAVENOUS | Status: AC
  Filled 2019-10-05: qty 250

## 2019-10-05 NOTE — Patient Instructions (Signed)
Cancer Center Discharge Instructions for Patients Receiving Chemotherapy  Today you received the following chemotherapy agents: durvalumab.  To help prevent nausea and vomiting after your treatment, we encourage you to take your nausea medication as directed.   If you develop nausea and vomiting that is not controlled by your nausea medication, call the clinic.   BELOW ARE SYMPTOMS THAT SHOULD BE REPORTED IMMEDIATELY:  *FEVER GREATER THAN 100.5 F  *CHILLS WITH OR WITHOUT FEVER  NAUSEA AND VOMITING THAT IS NOT CONTROLLED WITH YOUR NAUSEA MEDICATION  *UNUSUAL SHORTNESS OF BREATH  *UNUSUAL BRUISING OR BLEEDING  TENDERNESS IN MOUTH AND THROAT WITH OR WITHOUT PRESENCE OF ULCERS  *URINARY PROBLEMS  *BOWEL PROBLEMS  UNUSUAL RASH Items with * indicate a potential emergency and should be followed up as soon as possible.  Feel free to call the clinic should you have any questions or concerns. The clinic phone number is [phone].  Please show the CHEMO ALERT CARD at check-in to the Emergency Department and triage nurse.   

## 2019-10-07 ENCOUNTER — Ambulatory Visit: Payer: BC Managed Care – PPO | Attending: Internal Medicine

## 2019-10-07 DIAGNOSIS — Z23 Encounter for immunization: Secondary | ICD-10-CM | POA: Insufficient documentation

## 2019-10-07 NOTE — Progress Notes (Signed)
   Covid-19 Vaccination Clinic  Name:  Brian [name]    MRN: [id number] DOB: 1961-04-01  10/07/2019  [name] was observed post Covid-19 immunization for 15 minutes without incident. He was provided with Vaccine Information Sheet and instruction to access the V-Safe system.   [name] was instructed to call 911 with any severe reactions post vaccine: Marland Kitchen Difficulty breathing  . Swelling of face and throat  . A fast heartbeat  . A bad rash all over body  . Dizziness and weakness   Immunizations Administered    Name Date Dose VIS Date Route   Pfizer COVID-19 Vaccine 10/07/2019  3:53 PM 0.3 mL 07/14/2019 Intramuscular   Manufacturer: [place]   Lot: [id number]   [place]: [id number]

## 2019-10-19 ENCOUNTER — Encounter: Payer: Self-pay | Admitting: Internal Medicine

## 2019-10-19 ENCOUNTER — Inpatient Hospital Stay: Payer: BC Managed Care – PPO

## 2019-10-19 ENCOUNTER — Inpatient Hospital Stay (HOSPITAL_BASED_OUTPATIENT_CLINIC_OR_DEPARTMENT_OTHER): Payer: BC Managed Care – PPO | Admitting: Internal Medicine

## 2019-10-19 ENCOUNTER — Other Ambulatory Visit: Payer: Self-pay

## 2019-10-19 VITALS — HR 97

## 2019-10-19 VITALS — BP 136/88 | HR 111 | Temp 98.0°F | Resp 18 | Ht 68.0 in | Wt 189.0 lb

## 2019-10-19 DIAGNOSIS — E119 Type 2 diabetes mellitus without complications: Secondary | ICD-10-CM | POA: Diagnosis not present

## 2019-10-19 DIAGNOSIS — C770 Secondary and unspecified malignant neoplasm of lymph nodes of head, face and neck: Secondary | ICD-10-CM | POA: Diagnosis not present

## 2019-10-19 DIAGNOSIS — C3491 Malignant neoplasm of unspecified part of right bronchus or lung: Secondary | ICD-10-CM

## 2019-10-19 DIAGNOSIS — Z7901 Long term (current) use of anticoagulants: Secondary | ICD-10-CM | POA: Diagnosis not present

## 2019-10-19 DIAGNOSIS — Z7984 Long term (current) use of oral hypoglycemic drugs: Secondary | ICD-10-CM | POA: Diagnosis not present

## 2019-10-19 DIAGNOSIS — Z5112 Encounter for antineoplastic immunotherapy: Secondary | ICD-10-CM | POA: Diagnosis not present

## 2019-10-19 DIAGNOSIS — J45909 Unspecified asthma, uncomplicated: Secondary | ICD-10-CM | POA: Diagnosis not present

## 2019-10-19 DIAGNOSIS — R1310 Dysphagia, unspecified: Secondary | ICD-10-CM | POA: Diagnosis not present

## 2019-10-19 DIAGNOSIS — I10 Essential (primary) hypertension: Secondary | ICD-10-CM | POA: Diagnosis not present

## 2019-10-19 DIAGNOSIS — C3411 Malignant neoplasm of upper lobe, right bronchus or lung: Secondary | ICD-10-CM | POA: Diagnosis not present

## 2019-10-19 DIAGNOSIS — Z79899 Other long term (current) drug therapy: Secondary | ICD-10-CM | POA: Diagnosis not present

## 2019-10-19 LAB — CBC WITH DIFFERENTIAL (CANCER CENTER ONLY)
Abs Immature Granulocytes: 0.04 10*3/uL (ref 0.00–0.07)
Basophils Absolute: 0.1 10*3/uL (ref 0.0–0.1)
Basophils Relative: 1 %
Eosinophils Absolute: 0.3 10*3/uL (ref 0.0–0.5)
Eosinophils Relative: 3 %
HCT: 43.8 % (ref 39.0–52.0)
Hemoglobin: 14.7 g/dL (ref 13.0–17.0)
Immature Granulocytes: 0 %
Lymphocytes Relative: 8 %
Lymphs Abs: 0.8 10*3/uL (ref 0.7–4.0)
MCH: 30.2 pg (ref 26.0–34.0)
MCHC: 33.6 g/dL (ref 30.0–36.0)
MCV: 89.9 fL (ref 80.0–100.0)
Monocytes Absolute: 1.1 10*3/uL — ABNORMAL HIGH (ref 0.1–1.0)
Monocytes Relative: 11 %
Neutro Abs: 7.6 10*3/uL (ref 1.7–7.7)
Neutrophils Relative %: 77 %
Platelet Count: 215 10*3/uL (ref 150–400)
RBC: 4.87 MIL/uL (ref 4.22–5.81)
RDW: 13 % (ref 11.5–15.5)
WBC Count: 9.9 10*3/uL (ref 4.0–10.5)
nRBC: 0 % (ref 0.0–0.2)

## 2019-10-19 LAB — CMP (CANCER CENTER ONLY)
ALT: 25 U/L (ref 0–44)
AST: 22 U/L (ref 15–41)
Albumin: 3.7 g/dL (ref 3.5–5.0)
Alkaline Phosphatase: 107 U/L (ref 38–126)
Anion gap: 9 (ref 5–15)
BUN: 11 mg/dL (ref 6–20)
CO2: 22 mmol/L (ref 22–32)
Calcium: 8.8 mg/dL — ABNORMAL LOW (ref 8.9–10.3)
Chloride: 108 mmol/L (ref 98–111)
Creatinine: 0.83 mg/dL (ref 0.61–1.24)
GFR, Est AFR Am: >60 mL/min (ref >60)
GFR, Estimated: >60 mL/min (ref >60)
Glucose, Bld: 125 mg/dL — ABNORMAL HIGH (ref 70–99)
Potassium: 4.3 mmol/L (ref 3.5–5.1)
Sodium: 139 mmol/L (ref 135–145)
Total Bilirubin: 0.3 mg/dL (ref 0.3–1.2)
Total Protein: 6.5 g/dL (ref 6.5–8.1)

## 2019-10-19 MED ORDER — SODIUM CHLORIDE 0.9 % IV SOLN
10.0000 mg/kg | Freq: Once | INTRAVENOUS | Status: AC
  Administered 2019-10-19: 860 mg via INTRAVENOUS
  Filled 2019-10-19: qty 7.2

## 2019-10-19 MED ORDER — SODIUM CHLORIDE 0.9 % IV SOLN
Freq: Once | INTRAVENOUS | Status: AC
  Filled 2019-10-19: qty 250

## 2019-10-19 NOTE — Patient Instructions (Signed)
Cancer Center Discharge Instructions for Patients Receiving Chemotherapy  Today you received the following chemotherapy agents: durvalumab.  To help prevent nausea and vomiting after your treatment, we encourage you to take your nausea medication as directed.   If you develop nausea and vomiting that is not controlled by your nausea medication, call the clinic.   BELOW ARE SYMPTOMS THAT SHOULD BE REPORTED IMMEDIATELY:  *FEVER GREATER THAN 100.5 F  *CHILLS WITH OR WITHOUT FEVER  NAUSEA AND VOMITING THAT IS NOT CONTROLLED WITH YOUR NAUSEA MEDICATION  *UNUSUAL SHORTNESS OF BREATH  *UNUSUAL BRUISING OR BLEEDING  TENDERNESS IN MOUTH AND THROAT WITH OR WITHOUT PRESENCE OF ULCERS  *URINARY PROBLEMS  *BOWEL PROBLEMS  UNUSUAL RASH Items with * indicate a potential emergency and should be followed up as soon as possible.  Feel free to call the clinic should you have any questions or concerns. The clinic phone number is [phone].  Please show the CHEMO ALERT CARD at check-in to the Emergency Department and triage nurse.   

## 2019-10-19 NOTE — Progress Notes (Signed)
    [facility] [phone]   [phone]  OFFICE PROGRESS NOTE  Robbins, Brandn A, MD [address] [place] [address]  DIAGNOSIS: stage IIIB (T1c, N3, M0)non-small cell lung cancer, adenocarcinoma presented with right upper lobe lung mass in addition to mediastinal and bilateral supraclavicular lymphadenopathy diagnosed in January 2020. PD-L1: 10% Guardant 360 molecular studies showed no actionable mutation.  PRIOR THERAPY: Concurrent chemoradiation with weekly carboplatin for AUC of 2 and paclitaxel 45 mg/M2.  Status post 6 cycles.  Last dose was given on October 10, 2018 with stable disease.  CURRENT THERAPY:  Consolidation treatment with immunotherapy with Imfinzi 10 mg/KG every 2 weeks.  First dose November 17, 2018.  Status post 24 cycles.  INTERVAL HISTORY: Brian [name] 58 y.o. male returns to the clinic today for follow-up visit.  The patient is feeling fine today with no concerning complaints except for mild rash on the right side of the neck.  He denied having any current chest pain, shortness of breath, cough or hemoptysis.  He denied having any fever or chills.  He has no nausea, vomiting, diarrhea or constipation.  He denied having any headache or visual changes.  He denied having any weight loss or night sweats.  The patient is here today for evaluation before starting cycle #25 of his treatment.  MEDICAL HISTORY: Past Medical History:  Diagnosis Date  . Asthma   . Cancer ([facility])   . Diabetes mellitus without complication ([facility])   . Hypertension   . Subarachnoid hemorrhage ([facility])     ALLERGIES:  has No Known Allergies.  MEDICATIONS:  Current Outpatient Medications  Medication Sig Dispense Refill  . amLODipine (NORVASC) 5 MG tablet Take by mouth.    . atorvastatin (LIPITOR) 20 MG tablet Take by mouth.    . metFORMIN (GLUCOPHAGE) 500 MG tablet Take by mouth.    . prochlorperazine (COMPAZINE) 10 MG tablet Take 1 tablet (10 mg  total) by mouth every 6 (six) hours as needed for nausea or vomiting. 30 tablet 0  . Respiratory Therapy Supplies (FLUTTER) DEVI Please use up 10 times daily(4-5 breaths, 4-5 times daily) 1 each 0  . rivaroxaban (XARELTO) 20 MG TABS tablet Take 1 tablet (20 mg total) by mouth daily with supper. 30 tablet 2  . venlafaxine (EFFEXOR) 37.5 MG tablet TAKE 1 TABLET ONCE DAILY.     No current facility-administered medications for this visit.    SURGICAL HISTORY:  Past Surgical History:  Procedure Laterality Date  . LEG SURGERY  age 7    left leg, from fracture    REVIEW OF SYSTEMS:  A comprehensive review of systems was negative except for: Integument/breast: positive for rash   PHYSICAL EXAMINATION: General appearance: alert, cooperative and no distress Head: Normocephalic, without obvious abnormality, atraumatic Neck: no adenopathy, no JVD, supple, symmetrical, trachea midline and thyroid not enlarged, symmetric, no tenderness/mass/nodules Lymph nodes: Cervical, supraclavicular, and axillary nodes normal. Resp: clear to auscultation bilaterally Back: symmetric, no curvature. ROM normal. No CVA tenderness. Cardio: regular rate and rhythm, S1, S2 normal, no murmur, click, rub or gallop GI: soft, non-tender; bowel sounds normal; no masses,  no organomegaly Extremities: extremities normal, atraumatic, no cyanosis or edema  ECOG PERFORMANCE STATUS: 1 - Symptomatic but completely ambulatory  Blood pressure 136/88, pulse (!) 111, temperature 98 F (36.7 C), temperature source Temporal, resp. rate 18, height 5' 8" (1.727 m), weight 189 lb (85.7 kg), SpO2 97 %.  LABORATORY DATA: Lab   Results  Component Value Date   WBC 9.9 10/19/2019   HGB 14.7 10/19/2019   HCT 43.8 10/19/2019   MCV 89.9 10/19/2019   PLT 215 10/19/2019      Chemistry      Component Value Date/Time   NA 137 10/05/2019 0838   K 4.2 10/05/2019 0838   CL 107 10/05/2019 0838   CO2 23 10/05/2019 0838   BUN 14 10/05/2019  0838   CREATININE 0.80 10/05/2019 0838      Component Value Date/Time   CALCIUM 9.1 10/05/2019 0838   ALKPHOS 102 10/05/2019 0838   AST 23 10/05/2019 0838   ALT 28 10/05/2019 0838   BILITOT 0.3 10/05/2019 0838       RADIOGRAPHIC STUDIES: No results found.  ASSESSMENT AND PLAN: This is a very pleasant 58 years old white male with a stage IIIb non-small cell lung cancer, adenocarcinoma diagnosed in January 2020 with PDL 1 expression of 10% and no actionable mutations on Guardant 360 blood test. The patient underwent a course of concurrent chemoradiation with weekly carboplatin and paclitaxel status post 6 cycles.  He tolerated this course fairly well except for fatigue and odynophagia which significantly improved. The patient is currently undergoing consolidation treatment with Imfinzi 10 mg/KG every 2 weeks status post 24 cycles.  The patient continues to tolerate his treatment well with no concerning adverse effect except for mild rash on the right side of the neck, the same area of his previous radiotherapy but it could be related to his immunotherapy too. I recommended for him to continue his current treatment and he will proceed with cycle #25 today. For the skin rash, he will apply hydrocortisone cream to this area. He will come back for follow-up visit in 2 weeks for evaluation before the last cycle of his consolidation treatment with immunotherapy. The patient was advised to call immediately if he has any concerning symptoms in the interval. The patient voices understanding of current disease status and treatment options and is in agreement with the current care plan. All questions were answered. The patient knows to call the clinic with any problems, questions or concerns. We can certainly see the patient much sooner if necessary.  Disclaimer: This note was dictated with voice recognition software. Similar sounding words can inadvertently be transcribed and may not be corrected upon  review.       

## 2019-10-27 NOTE — Progress Notes (Signed)
Pharmacist Chemotherapy Monitoring - Follow Up Assessment    I verify that I have reviewed each item in the below checklist:  . Regimen for the patient is scheduled for the appropriate day and plan matches scheduled date. Marland Kitchen Appropriate non-routine labs are ordered dependent on drug ordered. . If applicable, additional medications reviewed and ordered per protocol based on lifetime cumulative doses and/or treatment regimen.   Plan for follow-up and/or issues identified: No . I-vent associated with next due treatment: No    Kennith Center, Pharm.D., CPP 10/27/2019@4 :36 PM

## 2019-10-28 ENCOUNTER — Ambulatory Visit: Payer: BC Managed Care – PPO | Attending: Internal Medicine

## 2019-10-28 DIAGNOSIS — Z23 Encounter for immunization: Secondary | ICD-10-CM

## 2019-10-28 NOTE — Progress Notes (Signed)
   Covid-19 Vaccination Clinic  Name:  Brian [name]    MRN: [id number] DOB: 1961/01/14  10/28/2019  [name] was observed post Covid-19 immunization for 15 minutes without incident. He was provided with Vaccine Information Sheet and instruction to access the V-Safe system.   [name] was instructed to call 911 with any severe reactions post vaccine: Marland Kitchen Difficulty breathing  . Swelling of face and throat  . A fast heartbeat  . A bad rash all over body  . Dizziness and weakness   Immunizations Administered    Name Date Dose VIS Date Route   Pfizer COVID-19 Vaccine 10/28/2019 11:01 AM 0.3 mL 07/14/2019 Intramuscular   Manufacturer: [place]   Lot: [id number]   [place]: [id number]

## 2019-11-01 NOTE — Progress Notes (Signed)
[facility] OFFICE PROGRESS NOTE  [name], MD [address] [place] [address]  DIAGNOSIS: Stage IIIB (T1c, N3, M0)non-small cell lung cancer, adenocarcinoma presented with right upper lobe lung mass in addition to mediastinal and bilateral supraclavicular lymphadenopathy diagnosed in January 2020.  PD-L1: 10%  Guardant 360 molecular studiesshowed no actionable mutation  PRIOR THERAPY:  1)Concurrent chemoradiation with weekly carboplatin for AUC of 2 and paclitaxel 45 mg/M2.Status post 6 cycles. Last dose was given on October 10, 2018 with stable disease. 2) Radiation treatment to the enlarging right cervical lymph nodes under the care of Dr. Sondra Come. First treatment 03/06/2019. Last treatment scheduledon9/05/2019  CURRENT THERAPY: Consolidation treatment with immunotherapy with Imfinzi 10 mg/KG every 2 weeks. First dose November 17, 2018. Status post25cycles  INTERVAL HISTORY: Brian [name] 59 y.o. male returns to the clinic for a follow up visit. The patient is feeling fatigued today. The patient continues to tolerate treatment with imfinzi well without any adverse effects except for mild increase in joint pain/arthritis and a rash on his right neck. Denies any fever, chills, night sweats, or weight loss. Denies any chest pain, shortness of breath, cough, or hemoptysis. He notes wheezing at night. He uses his flutter valve with some improvement. He was previously seen by pulmonology but has not been able to see them due to the [place] pandemic affecting his scheduled follow up appointments. Denies any nausea, vomiting, diarrhea, or constipation. Denies any headache or visual changes. The patient is here today for evaluation prior to starting cycle # 26  MEDICAL HISTORY: Past Medical History:  Diagnosis Date  . Asthma   . Cancer ([facility])   . Diabetes mellitus without complication ([place])   . Hypertension   . Subarachnoid hemorrhage ([facility])      ALLERGIES:  has No Known Allergies.  MEDICATIONS:  Current Outpatient Medications  Medication Sig Dispense Refill  . amLODipine (NORVASC) 5 MG tablet Take by mouth.    Marland Kitchen atorvastatin (LIPITOR) 20 MG tablet Take by mouth.    . metFORMIN (GLUCOPHAGE) 500 MG tablet Take by mouth.    . Respiratory Therapy Supplies (FLUTTER) DEVI Please use up 10 times daily(4-5 breaths, 4-5 times daily) 1 each 0  . rivaroxaban (XARELTO) 20 MG TABS tablet Take 1 tablet (20 mg total) by mouth daily with supper. 30 tablet 2  . venlafaxine (EFFEXOR) 37.5 MG tablet TAKE 1 TABLET ONCE DAILY.    Marland Kitchen prochlorperazine (COMPAZINE) 10 MG tablet Take 1 tablet (10 mg total) by mouth every 6 (six) hours as needed for nausea or vomiting. (Patient not taking: Reported on 11/02/2019) 30 tablet 0   No current facility-administered medications for this visit.   Facility-Administered Medications Ordered in Other Visits  Medication Dose Route Frequency Provider Last Rate Last Admin  . 0.9 %  sodium chloride infusion   Intravenous Once Curt Bears, MD      . durvalumab [facility]) 860 mg in sodium chloride 0.9 % 100 mL chemo infusion  10 mg/kg (Treatment Plan Recorded) Intravenous Once Curt Bears, MD        SURGICAL HISTORY:  Past Surgical History:  Procedure Laterality Date  . LEG SURGERY  age 73    left leg, from fracture    REVIEW OF SYSTEMS:   Review of Systems  Constitutional: Positive for fatigue. Negative for appetite change, chills, fever and unexpected weight change.  HENT: Negative for mouth sores, nosebleeds, sore throat and trouble swallowing.   Eyes: Negative for eye problems  and icterus.  Respiratory: Positive for wheezing at night. Negative for cough, hemoptysis, and shortness of breath.  Cardiovascular: Negative for chest pain and leg swelling.  Gastrointestinal: Negative for abdominal pain, constipation, diarrhea, nausea and vomiting.  Genitourinary: Negative for bladder incontinence, difficulty  urinating, dysuria, frequency and hematuria.   Musculoskeletal: Negative for back pain, gait problem, neck pain and neck stiffness.  Skin: Positive for rash right neck.   Neurological: Negative for dizziness, extremity weakness, gait problem, headaches, light-headedness and seizures.  Hematological: Negative for adenopathy. Does not bruise/bleed easily.  Psychiatric/Behavioral: Negative for confusion, depression and sleep disturbance. The patient is not nervous/anxious.     PHYSICAL EXAMINATION:  Blood pressure (!) 137/94, pulse 99, temperature 98.3 F (36.8 C), temperature source Oral, resp. rate 19, height '5\' 8"'  (1.727 m), weight 189 lb 11.2 oz (86 kg), SpO2 98 %.  ECOG PERFORMANCE STATUS: 1 - Symptomatic but completely ambulatory  Physical Exam  Constitutional: Oriented to person, place, and time and well-developed, well-nourished, and in no distress.  HENT:  Head: Normocephalic and atraumatic.  Mouth/Throat: Oropharynx is clear and moist. No oropharyngeal exudate.  Eyes: Conjunctivae are normal. Right eye exhibits no discharge. Left eye exhibits no discharge. No scleral icterus.  Neck: Normal range of motion. Neck supple.  Cardiovascular: Normal rate, regular rhythm, normal heart sounds and intact distal pulses.   Pulmonary/Chest: Effort normal and breath sounds normal. No respiratory distress. No wheezes. No rales.  Abdominal: Soft. Bowel sounds are normal. Exhibits no distension and no mass. There is no tenderness.  Musculoskeletal: Normal range of motion. Exhibits no edema.  Lymphadenopathy:    No cervical adenopathy.  Neurological: Alert and oriented to person, place, and time. Exhibits normal muscle tone. Gait normal. Coordination normal.  Skin: Rash on right lateral side of neck. Skin is warm and dry. Not diaphoretic. No erythema. No pallor.  Psychiatric: Mood, memory and judgment normal.  Vitals reviewed.  LABORATORY DATA: Lab Results  Component Value Date   WBC 8.7  11/02/2019   HGB 14.8 11/02/2019   HCT 44.5 11/02/2019   MCV 90.3 11/02/2019   PLT 199 11/02/2019      Chemistry      Component Value Date/Time   NA 142 11/02/2019 0843   K 4.1 11/02/2019 0843   CL 109 11/02/2019 0843   CO2 20 (L) 11/02/2019 0843   BUN 12 11/02/2019 0843   CREATININE 0.82 11/02/2019 0843      Component Value Date/Time   CALCIUM 8.8 (L) 11/02/2019 0843   ALKPHOS 103 11/02/2019 0843   AST 21 11/02/2019 0843   ALT 25 11/02/2019 0843   BILITOT 0.2 (L) 11/02/2019 0843       RADIOGRAPHIC STUDIES:  No results found.   ASSESSMENT/PLAN:  This is a very pleasant 59 year old Caucasian male diagnosed with stage IIIb non-small cell lung cancer, adenocarcinoma.He presented with a right upper lobe lung mass in addition to mediastinal and bilateral supraclavicular lymphadenopathy. He was diagnosed in January 2020. His PDL 1 expression is 10% and he has no actionable mutations.  He underwent a course of concurrent chemoradiation with weekly carboplatin and paclitaxel. He is status post 6 cycles. He tolerated well except for fatigue and odynophagia.   He is currently undergoing consolidation immunotherapy with Imfinzi 10 mg/kg IV every 2 weeks. He is status post25cycles of treatment.He tolerated it well without any adverse side effectsexcept has a very mild skin rash over his right cervical area.Hecompletedradiation to the enlarging right cervical lymph nodes.His last radiation treatmentwas  on9/05/2019.  Labs were reviewed. Recommend that he proceed with cycle #26today as scheduled.  I will arrange for a restaging CT scan of the chest and neck in 2 weeks.   We will see him back for a follow up visit in 2-3 weeks and to review the scan results.   He will continue to take his Xarelto as prescribed.  Encouraged the patient to call his pulmonologist office to reschedule his routine follow up appointment.   The patient was advised to call immediately if he  has any concerning symptoms in the interval. The patient voices understanding of current disease status and treatment options and is in agreement with the current care plan. All questions were answered. The patient knows to call the clinic with any problems, questions or concerns. We can certainly see the patient much sooner if necessary    Orders Placed This Encounter  Procedures  . CT Chest W Contrast    Standing Status:   Future    Standing Expiration Date:   11/01/2020    Order Specific Question:   ** REASON FOR EXAM (FREE TEXT)    Answer:   Restaging Lung Cancer    Order Specific Question:   If indicated for the ordered procedure, I authorize the administration of contrast media per Radiology protocol    Answer:   Yes    Order Specific Question:   Preferred imaging location?    Answer:   [facility]    Order Specific Question:   Radiology Contrast Protocol - do NOT remove file path    Answer:   \\[email or web address]  . CT Soft Tissue Neck W Contrast    Standing Status:   Future    Standing Expiration Date:   11/01/2020    Order Specific Question:   ** REASON FOR EXAM (FREE TEXT)    Answer:   Restaging Lung cancer, Has history of lymph nodes in neck.    Order Specific Question:   If indicated for the ordered procedure, I authorize the administration of contrast media per Radiology protocol    Answer:   Yes    Order Specific Question:   Preferred imaging location?    Answer:   [facility]    Order Specific Question:   Radiology Contrast Protocol - do NOT remove file path    Answer:   \\[email or web address]     [place], PA-C 11/02/19

## 2019-11-02 ENCOUNTER — Inpatient Hospital Stay: Payer: BC Managed Care – PPO

## 2019-11-02 ENCOUNTER — Inpatient Hospital Stay: Payer: BC Managed Care – PPO | Attending: Internal Medicine | Admitting: Physician Assistant

## 2019-11-02 ENCOUNTER — Other Ambulatory Visit: Payer: Self-pay

## 2019-11-02 ENCOUNTER — Encounter: Payer: Self-pay | Admitting: Physician Assistant

## 2019-11-02 VITALS — BP 137/94 | HR 99 | Temp 98.3°F | Resp 19 | Ht 68.0 in | Wt 189.7 lb

## 2019-11-02 DIAGNOSIS — C3411 Malignant neoplasm of upper lobe, right bronchus or lung: Secondary | ICD-10-CM | POA: Diagnosis not present

## 2019-11-02 DIAGNOSIS — Z7984 Long term (current) use of oral hypoglycemic drugs: Secondary | ICD-10-CM | POA: Insufficient documentation

## 2019-11-02 DIAGNOSIS — Z5112 Encounter for antineoplastic immunotherapy: Secondary | ICD-10-CM | POA: Insufficient documentation

## 2019-11-02 DIAGNOSIS — Z7901 Long term (current) use of anticoagulants: Secondary | ICD-10-CM | POA: Diagnosis not present

## 2019-11-02 DIAGNOSIS — C3491 Malignant neoplasm of unspecified part of right bronchus or lung: Secondary | ICD-10-CM

## 2019-11-02 DIAGNOSIS — J45909 Unspecified asthma, uncomplicated: Secondary | ICD-10-CM | POA: Insufficient documentation

## 2019-11-02 DIAGNOSIS — Z79899 Other long term (current) drug therapy: Secondary | ICD-10-CM | POA: Diagnosis not present

## 2019-11-02 DIAGNOSIS — Z923 Personal history of irradiation: Secondary | ICD-10-CM | POA: Insufficient documentation

## 2019-11-02 DIAGNOSIS — R1310 Dysphagia, unspecified: Secondary | ICD-10-CM | POA: Insufficient documentation

## 2019-11-02 DIAGNOSIS — Z9221 Personal history of antineoplastic chemotherapy: Secondary | ICD-10-CM | POA: Insufficient documentation

## 2019-11-02 DIAGNOSIS — I10 Essential (primary) hypertension: Secondary | ICD-10-CM | POA: Diagnosis not present

## 2019-11-02 DIAGNOSIS — E119 Type 2 diabetes mellitus without complications: Secondary | ICD-10-CM | POA: Diagnosis not present

## 2019-11-02 DIAGNOSIS — C770 Secondary and unspecified malignant neoplasm of lymph nodes of head, face and neck: Secondary | ICD-10-CM | POA: Diagnosis not present

## 2019-11-02 LAB — CMP (CANCER CENTER ONLY)
ALT: 25 U/L (ref 0–44)
AST: 21 U/L (ref 15–41)
Albumin: 3.7 g/dL (ref 3.5–5.0)
Alkaline Phosphatase: 103 U/L (ref 38–126)
Anion gap: 13 (ref 5–15)
BUN: 12 mg/dL (ref 6–20)
CO2: 20 mmol/L — ABNORMAL LOW (ref 22–32)
Calcium: 8.8 mg/dL — ABNORMAL LOW (ref 8.9–10.3)
Chloride: 109 mmol/L (ref 98–111)
Creatinine: 0.82 mg/dL (ref 0.61–1.24)
GFR, Est AFR Am: >60 mL/min (ref >60)
GFR, Estimated: >60 mL/min (ref >60)
Glucose, Bld: 116 mg/dL — ABNORMAL HIGH (ref 70–99)
Potassium: 4.1 mmol/L (ref 3.5–5.1)
Sodium: 142 mmol/L (ref 135–145)
Total Bilirubin: 0.2 mg/dL — ABNORMAL LOW (ref 0.3–1.2)
Total Protein: 6.8 g/dL (ref 6.5–8.1)

## 2019-11-02 LAB — CBC WITH DIFFERENTIAL (CANCER CENTER ONLY)
Abs Immature Granulocytes: 0.02 10*3/uL (ref 0.00–0.07)
Basophils Absolute: 0.1 10*3/uL (ref 0.0–0.1)
Basophils Relative: 1 %
Eosinophils Absolute: 0.4 10*3/uL (ref 0.0–0.5)
Eosinophils Relative: 4 %
HCT: 44.5 % (ref 39.0–52.0)
Hemoglobin: 14.8 g/dL (ref 13.0–17.0)
Immature Granulocytes: 0 %
Lymphocytes Relative: 8 %
Lymphs Abs: 0.7 10*3/uL (ref 0.7–4.0)
MCH: 30 pg (ref 26.0–34.0)
MCHC: 33.3 g/dL (ref 30.0–36.0)
MCV: 90.3 fL (ref 80.0–100.0)
Monocytes Absolute: 1 10*3/uL (ref 0.1–1.0)
Monocytes Relative: 12 %
Neutro Abs: 6.6 10*3/uL (ref 1.7–7.7)
Neutrophils Relative %: 75 %
Platelet Count: 199 10*3/uL (ref 150–400)
RBC: 4.93 MIL/uL (ref 4.22–5.81)
RDW: 12.9 % (ref 11.5–15.5)
WBC Count: 8.7 10*3/uL (ref 4.0–10.5)
nRBC: 0 % (ref 0.0–0.2)

## 2019-11-02 LAB — TSH: TSH: 4.797 u[IU]/mL — ABNORMAL HIGH (ref 0.320–4.118)

## 2019-11-02 MED ORDER — SODIUM CHLORIDE 0.9 % IV SOLN
Freq: Once | INTRAVENOUS | Status: AC
  Filled 2019-11-02: qty 250

## 2019-11-02 MED ORDER — SODIUM CHLORIDE 0.9 % IV SOLN
10.0000 mg/kg | Freq: Once | INTRAVENOUS | Status: AC
  Administered 2019-11-02: 860 mg via INTRAVENOUS
  Filled 2019-11-02: qty 10

## 2019-11-02 NOTE — Patient Instructions (Signed)
[facility] Discharge Instructions for Patients Receiving Chemotherapy  Today you received the following chemotherapy agents: durvalumab.  To help prevent nausea and vomiting after your treatment, we encourage you to take your nausea medication as directed.   If you develop nausea and vomiting that is not controlled by your nausea medication, call the clinic.   BELOW ARE SYMPTOMS THAT SHOULD BE REPORTED IMMEDIATELY:  *FEVER GREATER THAN 100.5 F  *CHILLS WITH OR WITHOUT FEVER  NAUSEA AND VOMITING THAT IS NOT CONTROLLED WITH YOUR NAUSEA MEDICATION  *UNUSUAL SHORTNESS OF BREATH  *UNUSUAL BRUISING OR BLEEDING  TENDERNESS IN MOUTH AND THROAT WITH OR WITHOUT PRESENCE OF ULCERS  *URINARY PROBLEMS  *BOWEL PROBLEMS  UNUSUAL RASH Items with * indicate a potential emergency and should be followed up as soon as possible.  Feel free to call the clinic should you have any questions or concerns. The clinic phone number is [phone].  Please show the CHEMO ALERT CARD at check-in to the Emergency Department and triage nurse.   

## 2019-11-03 ENCOUNTER — Telehealth: Payer: Self-pay | Admitting: Internal Medicine

## 2019-11-03 NOTE — Telephone Encounter (Signed)
Scheduled per los. Called and spoke with patient. Confirmed appt 

## 2019-11-07 ENCOUNTER — Ambulatory Visit: Payer: Self-pay

## 2019-11-19 NOTE — Progress Notes (Signed)
[facility] OFFICE PROGRESS NOTE  [name], MD [address] [place] [address]  DIAGNOSIS: Stage IIIB (T1c, N3, M0)non-small cell lung cancer, adenocarcinoma presented with right upper lobe lung mass in addition to mediastinal and bilateral supraclavicular lymphadenopathy diagnosed in January 2020.  PD-L1: 10%  Guardant 360 molecular studiesshowed no actionable mutation  PRIOR THERAPY:  1)Concurrent chemoradiation with weekly carboplatin for AUC of 2 and paclitaxel 45 mg/M2.Status post 6 cycles. Last dose was given on October 10, 2018 with stable disease. 2) Radiation treatment to the enlarging right cervical lymph nodes under the care of Dr. Sondra Come. First treatment 03/06/2019. Last treatment scheduledon9/05/2019 3) Consolidation treatment with immunotherapy with Imfinzi 10 mg/KG every 2 weeks. First dose November 17, 2018. Status post26cycles  CURRENT THERAPY:Observation  INTERVAL HISTORY: Brian [name] 59 y.o. male returns to the clinic for a follow up visit. The patient is feeling well today without any concerning complaints except for skin soreness/thickening from his prior radiation treatment to the cervical lymph node in September 2020. He is inquiring about any recommendations regarding the skin changes. He also reports a tender spot in the right occipital. This region without any palpable or skin abnormalities. He recently completed his 26 cycles of consolidation immunotherapy. He tolerated it well without any major concerning adverse side effects. Denies any fever, chills, night sweats, or weight loss. Denies any chest pain, significant shortness of breath, cough, or hemoptysis. He notes some allergies. He is scheduled to see pulmonology on 12/19/19 for routine follow up and for recommendations for management of his COPD. Denies any nausea, vomiting, diarrhea, or constipation. Denies any headache or visual changes. He recently had a restaging CT  scan. He is here for evaluation and to review his scan results.     MEDICAL HISTORY: Past Medical History:  Diagnosis Date  . Asthma   . Diabetes mellitus without complication ([facility])   . Hypertension   . nscl ca dx'd 08/2018  . Subarachnoid hemorrhage ([facility])     ALLERGIES:  has No Known Allergies.  MEDICATIONS:  Current Outpatient Medications  Medication Sig Dispense Refill  . amLODipine (NORVASC) 5 MG tablet Take by mouth.    Marland Kitchen atorvastatin (LIPITOR) 20 MG tablet Take by mouth.    . metFORMIN (GLUCOPHAGE) 500 MG tablet Take by mouth.    . prochlorperazine (COMPAZINE) 10 MG tablet Take 1 tablet (10 mg total) by mouth every 6 (six) hours as needed for nausea or vomiting. 30 tablet 0  . Respiratory Therapy Supplies (FLUTTER) DEVI Please use up 10 times daily(4-5 breaths, 4-5 times daily) 1 each 0  . rivaroxaban (XARELTO) 20 MG TABS tablet Take 1 tablet (20 mg total) by mouth daily with supper. 30 tablet 2  . venlafaxine (EFFEXOR) 37.5 MG tablet TAKE 1 TABLET ONCE DAILY.     No current facility-administered medications for this visit.    SURGICAL HISTORY:  Past Surgical History:  Procedure Laterality Date  . LEG SURGERY  age 27    left leg, from fracture    REVIEW OF SYSTEMS:   Review of Systems  Constitutional: Negative for appetite change, chills, fatigue, fever and unexpected weight change.  HENT: Negative for mouth sores, nosebleeds, sore throat and trouble swallowing.   Eyes: Negative for eye problems and icterus.  Respiratory: Negative for cough, hemoptysis, shortness of breath and wheezing.  Cardiovascular: Negative for chest pain and leg swelling.  Gastrointestinal: Negative for abdominal pain, constipation, diarrhea, nausea and vomiting.  Genitourinary: Negative  for bladder incontinence, difficulty urinating, dysuria, frequency and hematuria.   Musculoskeletal: Negative for back pain, gait problem, neck pain and neck stiffness.  Skin: Positive for improving rash on  right cervical area. Positive for skin thickness from prior radiation.  Neurological: Negative for dizziness, extremity weakness, gait problem, headaches, light-headedness and seizures.  Hematological: Negative for adenopathy. Does not bruise/bleed easily. No palpable adenopathy in the region that is sore.  Psychiatric/Behavioral: Negative for confusion, depression and sleep disturbance. The patient is not nervous/anxious.     PHYSICAL EXAMINATION:  Blood pressure 128/87, pulse 97, temperature 98.7 F (37.1 C), temperature source Temporal, resp. rate 18, height '5\' 8"'  (1.727 m), weight 193 lb 8 oz (87.8 kg), SpO2 98 %.  ECOG PERFORMANCE STATUS: 1 - Symptomatic but completely ambulatory  Physical Exam  Constitutional: Oriented to person, place, and time and well-developed, well-nourished, and in no distress HENT:  Head: Normocephalic and atraumatic.  Mouth/Throat: Oropharynx is clear and moist. No oropharyngeal exudate.  Eyes: Conjunctivae are normal. Right eye exhibits no discharge. Left eye exhibits no discharge. No scleral icterus.  Neck: Normal range of motion. Neck supple.  Cardiovascular: Normal rate, regular rhythm, normal heart sounds and intact distal pulses.   Pulmonary/Chest: Effort normal and breath sounds normal. No respiratory distress. No wheezes. No rales.  Abdominal: Soft. Bowel sounds are normal. Exhibits no distension and no mass. There is no tenderness.  Musculoskeletal: Normal range of motion. Exhibits no edema.  Lymphadenopathy:    No cervical adenopathy. No palpable adenopathy.  Neurological: Alert and oriented to person, place, and time. Exhibits normal muscle tone. Gait normal. Coordination normal.  Skin: Skin is warm and dry.  Positive for improving rash on right cervical area. Positive for skin thickening from prior radiation.  Psychiatric: Mood, memory and judgment normal.  Vitals reviewed.  LABORATORY DATA: Lab Results  Component Value Date   WBC 10.5  11/22/2019   HGB 14.3 11/22/2019   HCT 42.7 11/22/2019   MCV 87.9 11/22/2019   PLT 213 11/22/2019      Chemistry      Component Value Date/Time   NA 139 11/22/2019 1431   K 4.0 11/22/2019 1431   CL 105 11/22/2019 1431   CO2 26 11/22/2019 1431   BUN 15 11/22/2019 1431   CREATININE 1.15 11/22/2019 1431      Component Value Date/Time   CALCIUM 8.9 11/22/2019 1431   ALKPHOS 95 11/22/2019 1431   AST 26 11/22/2019 1431   ALT 40 11/22/2019 1431   BILITOT <0.2 (L) 11/22/2019 1431       RADIOGRAPHIC STUDIES:  CT Soft Tissue Neck W Contrast  Result Date: 11/21/2019 CLINICAL DATA:  Adenocarcinoma of right lung, stage III. Restaging lung cancer, history of lymph nodes in neck. Additional history provided: XRT to neck and chest, immunotherapy, chemotherapy, change in texture to right lateral neck with occasional (rash like) itching. EXAM: CT NECK WITH CONTRAST TECHNIQUE: Multidetector CT imaging of the neck was performed using the standard protocol following the bolus administration of intravenous contrast. CONTRAST:  88m OMNIPAQUE IOHEXOL 300 MG/ML  SOLN COMPARISON:  Neck CT 07/26/2019, neck CT 04/18/2019 FINDINGS: Pharynx and larynx: No appreciable swelling or discrete mass within the oral cavity, pharynx or larynx. Salivary glands: No inflammation, mass, or stone. Thyroid: Unremarkable. Lymph nodes: A cluster of subcentimeter right level III/IV lymph nodes has a more matted and indistinct appearance as compared to exam 07/26/2019 (series 603, image 42). However, the individual nodes do not appear significantly changed in size.  Right level V lymph nodes have decreased in size, now measuring up to 1 cm (previously 1.1 cm) (series 603, image 70). Interval increase in size of nonspecific subcentimeter left level III/IV lymph nodes measuring up to 0.5 cm in short axis (for instance as seen on series 603, image 45). No pathologically enlarged or suspicious lymph nodes demonstrated elsewhere within  the neck. Vascular: The major vascular structures of the neck appear patent. Limited intracranial: No abnormality identified. Visualized orbits: Incompletely imaged. Visualized portions unremarkable. Mastoids and visualized paranasal sinuses: No significant paranasal sinus disease or mastoid effusion at the imaged levels. Skeleton: No acute bony abnormality or aggressive osseous lesion. Upper chest: Reported separately. Other: Mild inflammatory stranding and skin thickening within the right neck soft tissues which may reflect sequela of reported radiation therapy. IMPRESSION: 1. A cluster of subcentimeter right level III/IV lymph nodes has a more matted and indistinct appearance as compared to 07/26/2019. However, the individual nodes do not appear significantly changed in size. 2. Right level V lymph nodes have decreased in size, now measuring up to 1 cm. 3. Interval increase in size of left level III/IV lymph nodes measuring subcentimeter in short axis, nonspecific. Attention is recommended on follow-up. 4. No pathologically enlarged or suspicious lymph nodes demonstrated elsewhere within the neck. 5. Mild soft tissue stranding and skin thickening within the right neck, likely reflecting sequela of reported radiation therapy. Electronically Signed   By: Kellie Simmering DO   On: 11/21/2019 09:11   CT Chest W Contrast  Result Date: 11/21/2019 CLINICAL DATA:  Metastatic lung cancer, status post chemotherapy and XRT, immunotherapy complete 2 weeks ago EXAM: CT CHEST WITH CONTRAST TECHNIQUE: Multidetector CT imaging of the chest was performed during intravenous contrast administration. CONTRAST:  73m OMNIPAQUE IOHEXOL 300 MG/ML  SOLN COMPARISON:  09/08/2019 FINDINGS: Cardiovascular: The heart is normal in size. No pericardial effusion. No evidence of thoracic aortic aneurysm. Mild atherosclerotic calcifications of the aortic arch. Mild coronary atherosclerosis of the LAD. Mediastinum/Nodes: 8 mm short axis subcarinal  node, grossly unchanged. No suspicious hilar or axillary lymphadenopathy. Visualized thyroid is unremarkable. Lungs/Pleura: Radiation changes in the medial right upper lobe/perihilar region. Peribronchial thickening in the superior segment right lower lobe, with associated ground-glass opacity and mild nodularity, mildly progressive. Associated 15 x 23 mm nodule in the superior segment right lower lobe (series 6/image 65). Trace right pleural effusion. Left lung is clear. No pneumothorax. Upper Abdomen: Visualized upper abdomen is notable for a 7 mm short axis gastrohepatic node (series 3/image 84), new. Musculoskeletal: Mild degenerative changes of the visualized thoracolumbar spine. IMPRESSION: Radiation changes in the medial right upper lobe/perihilar region. Peribronchial thickening with ground-glass opacity in the superior segment right lower lobe, with mildly progressive nodularity. Attention on follow-up is suggested to exclude recurrence. New 7 mm short axis gastrohepatic node, indeterminate. Aortic Atherosclerosis (ICD10-I70.0). Electronically Signed   By: SJulian HyM.D.   On: 11/21/2019 12:05     ASSESSMENT/PLAN:  This is a very pleasant 59year old Caucasian male diagnosed with stage IIIb non-small cell lung cancer, adenocarcinoma.He presented with a right upper lobe lung mass in addition to mediastinal and bilateral supraclavicular lymphadenopathy. He was diagnosed in January 2020. His PDL 1 expression is 10% and he has no actionable mutations.  He underwent a course of concurrent chemoradiation with weekly carboplatin and paclitaxel. He is status post 6 cycles. He tolerated well except for fatigue and odynophagia.   He completed his 26 cycles of consolidation immunotherapy with Imfinzi 10 mg/kg IV  every 2 weeks. He tolerated it well without any adverse side effectsexcept has a very mild skin rash over his right cervical area.Hecompletedradiation to the enlarging right  cervical lymph nodes.His last radiation treatmentwas on9/05/2019.  The patient recently had a restaging CT scan performed. Dr. Julien Nordmann personally and independently reviewed his scan and discussed the results with the patient. The scan showed "a cluster of subcentimeter right level III/IV lymph nodes has a more matted and indistinct appearance as compared to 07/26/2019. However, the individual nodes do not appear significantly changed in Size. A small nonspecific increase in left level III/IV lymph nodes. Radiation changes in the medial right upper lobe/perihilar region. Peribronchial thickening with ground-glass opacity in the superior segment right lower lobe, with mildly progressive nodularity."  Dr. Julien Nordmann discussed that he recommends that the patient continue on observation with a shorter interval follow up with a repeat CT scan of the neck and chest in 2 months. Considering these findings are below pathologic size, Dr. Julien Nordmann would not recommend a PET scan at this point but may consider a PET scan in the future if these findings enlarge in the interval.   I will arrange for a restaging CT scan of the chest and neck in 2 months  We will see him back for a follow up visit in 2 months for evaluation and to review his scan results.   He will see pulmonology on 5/18 as scheduled.   I reached out to radiation oncology regarding his skin changes from radiation treatment. They recommended good quality lotion with vitamin E and/or vitamin E oil.  The patient was advised to call immediately if he has any concerning symptoms in the interval. The patient voices understanding of current disease status and treatment options and is in agreement with the current care plan. All questions were answered. The patient knows to call the clinic with any problems, questions or concerns. We can certainly see the patient much sooner if necessary   Orders Placed This Encounter  Procedures  . CT Chest W  Contrast    Standing Status:   Future    Standing Expiration Date:   11/21/2020    Order Specific Question:   ** REASON FOR EXAM (FREE TEXT)    Answer:   Restaging Lung Cancer    Order Specific Question:   If indicated for the ordered procedure, I authorize the administration of contrast media per Radiology protocol    Answer:   Yes    Order Specific Question:   Preferred imaging location?    Answer:   [facility]    Order Specific Question:   Radiology Contrast Protocol - do NOT remove file path    Answer:   \\[email or web address]  . CT Soft Tissue Neck W Contrast    Standing Status:   Future    Standing Expiration Date:   11/21/2020    Order Specific Question:   ** REASON FOR EXAM (FREE TEXT)    Answer:   Restaging Lung Cancer    Order Specific Question:   If indicated for the ordered procedure, I authorize the administration of contrast media per Radiology protocol    Answer:   Yes    Order Specific Question:   Preferred imaging location?    Answer:   [facility]    Order Specific Question:   Radiology Contrast Protocol - do NOT remove file path    Answer:   \\[email or web address]  . CBC with Differential ([facility] Only)  Standing Status:   Future    Standing Expiration Date:   11/21/2020  . CMP ([place] only)    Standing Status:   Future    Standing Expiration Date:   11/21/2020     Tobe Sos Maikel Neisler, PA-C 11/22/19  ADDENDUM: Hematology/Oncology Attending: I had a face-to-face encounter with the patient today.  I recommended his care plan.  This is a very pleasant 59 years old white male with a stage IIIb non-small cell lung cancer is the adenocarcinoma status post induction concurrent chemoradiation with weekly carboplatin and paclitaxel with partial response. The patient was then treated with a course of consolidation immunotherapy with Imfinzi every 2 weeks status post 26 cycles.  He tolerated  his treatment well except for increasing fatigue close to the end of his treatment. The patient had repeat CT scan of the neck and the chest performed recently.  I personally and independently reviewed the scan images and discussed the results with the patient today. His scan showed subcentimeter lymph nodes in the left as well as the right cervical area in addition to a 7 mm lymph node in the gastrohepatic region. There is no clear evidence for disease progression. I recommended for the patient to continue on observation with repeat CT scan of the neck and the chest in 2 months for further evaluation of these lymph nodes.  He was also advised to call immediately if he develop any symptoms or suspicious lesion in the interval. The patient was also advised to call immediately if he has any concerning complaints.  Disclaimer: This note was dictated with voice recognition software. Similar sounding words can inadvertently be transcribed and may be missed upon review. Eilleen Kempf, MD 11/22/19

## 2019-11-20 ENCOUNTER — Encounter (HOSPITAL_COMMUNITY): Payer: Self-pay

## 2019-11-20 ENCOUNTER — Ambulatory Visit (HOSPITAL_COMMUNITY)
Admission: RE | Admit: 2019-11-20 | Discharge: 2019-11-20 | Disposition: A | Discharge status: Home / Self Care | Payer: BC Managed Care – PPO | Source: Ambulatory Visit | Attending: Physician Assistant | Admitting: Physician Assistant

## 2019-11-20 ENCOUNTER — Other Ambulatory Visit: Payer: Self-pay

## 2019-11-20 DIAGNOSIS — C3491 Malignant neoplasm of unspecified part of right bronchus or lung: Secondary | ICD-10-CM | POA: Diagnosis not present

## 2019-11-20 DIAGNOSIS — C801 Malignant (primary) neoplasm, unspecified: Secondary | ICD-10-CM | POA: Diagnosis not present

## 2019-11-20 DIAGNOSIS — C7800 Secondary malignant neoplasm of unspecified lung: Secondary | ICD-10-CM | POA: Diagnosis not present

## 2019-11-20 DIAGNOSIS — Z5111 Encounter for antineoplastic chemotherapy: Secondary | ICD-10-CM | POA: Diagnosis not present

## 2019-11-20 MED ORDER — SODIUM CHLORIDE (PF) 0.9 % IJ SOLN
INTRAMUSCULAR | Status: AC
  Filled 2019-11-20: qty 50

## 2019-11-20 MED ORDER — IOHEXOL 300 MG/ML  SOLN
75.0000 mL | Freq: Once | INTRAMUSCULAR | Status: AC | PRN
  Administered 2019-11-20: 15:00:00 75 mL via INTRAVENOUS

## 2019-11-21 ENCOUNTER — Other Ambulatory Visit: Payer: Self-pay | Admitting: Physician Assistant

## 2019-11-21 DIAGNOSIS — C3491 Malignant neoplasm of unspecified part of right bronchus or lung: Secondary | ICD-10-CM

## 2019-11-22 ENCOUNTER — Other Ambulatory Visit: Payer: Self-pay

## 2019-11-22 ENCOUNTER — Inpatient Hospital Stay (HOSPITAL_BASED_OUTPATIENT_CLINIC_OR_DEPARTMENT_OTHER): Payer: BC Managed Care – PPO | Admitting: Physician Assistant

## 2019-11-22 ENCOUNTER — Inpatient Hospital Stay: Payer: BC Managed Care – PPO

## 2019-11-22 ENCOUNTER — Encounter: Payer: Self-pay | Admitting: Physician Assistant

## 2019-11-22 VITALS — BP 128/87 | HR 97 | Temp 98.7°F | Resp 18 | Ht 68.0 in | Wt 193.5 lb

## 2019-11-22 DIAGNOSIS — C3491 Malignant neoplasm of unspecified part of right bronchus or lung: Secondary | ICD-10-CM

## 2019-11-22 DIAGNOSIS — Z5112 Encounter for antineoplastic immunotherapy: Secondary | ICD-10-CM | POA: Diagnosis not present

## 2019-11-22 DIAGNOSIS — Z9221 Personal history of antineoplastic chemotherapy: Secondary | ICD-10-CM | POA: Diagnosis not present

## 2019-11-22 DIAGNOSIS — I10 Essential (primary) hypertension: Secondary | ICD-10-CM | POA: Diagnosis not present

## 2019-11-22 DIAGNOSIS — C770 Secondary and unspecified malignant neoplasm of lymph nodes of head, face and neck: Secondary | ICD-10-CM | POA: Diagnosis not present

## 2019-11-22 DIAGNOSIS — Z7984 Long term (current) use of oral hypoglycemic drugs: Secondary | ICD-10-CM | POA: Diagnosis not present

## 2019-11-22 DIAGNOSIS — E119 Type 2 diabetes mellitus without complications: Secondary | ICD-10-CM | POA: Diagnosis not present

## 2019-11-22 DIAGNOSIS — Z79899 Other long term (current) drug therapy: Secondary | ICD-10-CM | POA: Diagnosis not present

## 2019-11-22 DIAGNOSIS — Z7901 Long term (current) use of anticoagulants: Secondary | ICD-10-CM | POA: Diagnosis not present

## 2019-11-22 DIAGNOSIS — Z923 Personal history of irradiation: Secondary | ICD-10-CM | POA: Diagnosis not present

## 2019-11-22 DIAGNOSIS — R1310 Dysphagia, unspecified: Secondary | ICD-10-CM | POA: Diagnosis not present

## 2019-11-22 DIAGNOSIS — J45909 Unspecified asthma, uncomplicated: Secondary | ICD-10-CM | POA: Diagnosis not present

## 2019-11-22 DIAGNOSIS — C3411 Malignant neoplasm of upper lobe, right bronchus or lung: Secondary | ICD-10-CM | POA: Diagnosis not present

## 2019-11-22 LAB — CMP (CANCER CENTER ONLY)
ALT: 40 U/L (ref 0–44)
AST: 26 U/L (ref 15–41)
Albumin: 3.8 g/dL (ref 3.5–5.0)
Alkaline Phosphatase: 95 U/L (ref 38–126)
Anion gap: 8 (ref 5–15)
BUN: 15 mg/dL (ref 6–20)
CO2: 26 mmol/L (ref 22–32)
Calcium: 8.9 mg/dL (ref 8.9–10.3)
Chloride: 105 mmol/L (ref 98–111)
Creatinine: 1.15 mg/dL (ref 0.61–1.24)
GFR, Est AFR Am: >60 mL/min (ref >60)
GFR, Estimated: >60 mL/min (ref >60)
Glucose, Bld: 112 mg/dL — ABNORMAL HIGH (ref 70–99)
Potassium: 4 mmol/L (ref 3.5–5.1)
Sodium: 139 mmol/L (ref 135–145)
Total Bilirubin: <0.2 mg/dL — ABNORMAL LOW (ref 0.3–1.2)
Total Protein: 6.8 g/dL (ref 6.5–8.1)

## 2019-11-22 LAB — CBC WITH DIFFERENTIAL (CANCER CENTER ONLY)
Abs Immature Granulocytes: 0.1 10*3/uL — ABNORMAL HIGH (ref 0.00–0.07)
Basophils Absolute: 0.1 10*3/uL (ref 0.0–0.1)
Basophils Relative: 1 %
Eosinophils Absolute: 0.4 10*3/uL (ref 0.0–0.5)
Eosinophils Relative: 4 %
HCT: 42.7 % (ref 39.0–52.0)
Hemoglobin: 14.3 g/dL (ref 13.0–17.0)
Immature Granulocytes: 1 %
Lymphocytes Relative: 8 %
Lymphs Abs: 0.8 10*3/uL (ref 0.7–4.0)
MCH: 29.4 pg (ref 26.0–34.0)
MCHC: 33.5 g/dL (ref 30.0–36.0)
MCV: 87.9 fL (ref 80.0–100.0)
Monocytes Absolute: 1.4 10*3/uL — ABNORMAL HIGH (ref 0.1–1.0)
Monocytes Relative: 13 %
Neutro Abs: 7.7 10*3/uL (ref 1.7–7.7)
Neutrophils Relative %: 73 %
Platelet Count: 213 10*3/uL (ref 150–400)
RBC: 4.86 MIL/uL (ref 4.22–5.81)
RDW: 13.1 % (ref 11.5–15.5)
WBC Count: 10.5 10*3/uL (ref 4.0–10.5)
nRBC: 0 % (ref 0.0–0.2)

## 2019-11-23 ENCOUNTER — Telehealth: Payer: Self-pay | Admitting: Internal Medicine

## 2019-11-23 LAB — TSH: TSH: 6.758 u[IU]/mL — ABNORMAL HIGH (ref 0.320–4.118)

## 2019-11-23 NOTE — Telephone Encounter (Signed)
Scheduled per los. Called and left msg. Mailed printout  °

## 2019-11-26 ENCOUNTER — Encounter (HOSPITAL_COMMUNITY): Payer: Self-pay | Admitting: Emergency Medicine

## 2019-11-26 ENCOUNTER — Emergency Department (HOSPITAL_COMMUNITY)
Admission: EM | Admit: 2019-11-26 | Discharge: 2019-11-26 | Disposition: A | Discharge status: Home / Self Care | Payer: BC Managed Care – PPO | Attending: Emergency Medicine | Admitting: Emergency Medicine

## 2019-11-26 ENCOUNTER — Emergency Department (HOSPITAL_COMMUNITY): Payer: BC Managed Care – PPO

## 2019-11-26 ENCOUNTER — Other Ambulatory Visit: Payer: Self-pay

## 2019-11-26 DIAGNOSIS — R0602 Shortness of breath: Secondary | ICD-10-CM | POA: Insufficient documentation

## 2019-11-26 DIAGNOSIS — J40 Bronchitis, not specified as acute or chronic: Secondary | ICD-10-CM | POA: Insufficient documentation

## 2019-11-26 DIAGNOSIS — J45909 Unspecified asthma, uncomplicated: Secondary | ICD-10-CM | POA: Diagnosis not present

## 2019-11-26 DIAGNOSIS — Z79899 Other long term (current) drug therapy: Secondary | ICD-10-CM | POA: Diagnosis not present

## 2019-11-26 DIAGNOSIS — R05 Cough: Secondary | ICD-10-CM | POA: Insufficient documentation

## 2019-11-26 DIAGNOSIS — E119 Type 2 diabetes mellitus without complications: Secondary | ICD-10-CM | POA: Insufficient documentation

## 2019-11-26 DIAGNOSIS — Z7901 Long term (current) use of anticoagulants: Secondary | ICD-10-CM | POA: Diagnosis not present

## 2019-11-26 DIAGNOSIS — Z7984 Long term (current) use of oral hypoglycemic drugs: Secondary | ICD-10-CM | POA: Diagnosis not present

## 2019-11-26 DIAGNOSIS — R079 Chest pain, unspecified: Secondary | ICD-10-CM | POA: Insufficient documentation

## 2019-11-26 LAB — COMPREHENSIVE METABOLIC PANEL
ALT: 27 U/L (ref 0–44)
AST: 24 U/L (ref 15–41)
Albumin: 3.9 g/dL (ref 3.5–5.0)
Alkaline Phosphatase: 90 U/L (ref 38–126)
Anion gap: 8 (ref 5–15)
BUN: 14 mg/dL (ref 6–20)
CO2: 25 mmol/L (ref 22–32)
Calcium: 9.1 mg/dL (ref 8.9–10.3)
Chloride: 106 mmol/L (ref 98–111)
Creatinine, Ser: 0.81 mg/dL (ref 0.61–1.24)
GFR calc Af Amer: >60 mL/min (ref >60)
GFR calc non Af Amer: >60 mL/min (ref >60)
Glucose, Bld: 94 mg/dL (ref 70–99)
Potassium: 4.2 mmol/L (ref 3.5–5.1)
Sodium: 139 mmol/L (ref 135–145)
Total Bilirubin: 0.4 mg/dL (ref 0.3–1.2)
Total Protein: 6.8 g/dL (ref 6.5–8.1)

## 2019-11-26 LAB — CBC WITH DIFFERENTIAL/PLATELET
Abs Immature Granulocytes: 0.05 10*3/uL (ref 0.00–0.07)
Basophils Absolute: 0.1 10*3/uL (ref 0.0–0.1)
Basophils Relative: 1 %
Eosinophils Absolute: 0.3 10*3/uL (ref 0.0–0.5)
Eosinophils Relative: 3 %
HCT: 44.7 % (ref 39.0–52.0)
Hemoglobin: 14.8 g/dL (ref 13.0–17.0)
Immature Granulocytes: 1 %
Lymphocytes Relative: 7 %
Lymphs Abs: 0.7 10*3/uL (ref 0.7–4.0)
MCH: 29.8 pg (ref 26.0–34.0)
MCHC: 33.1 g/dL (ref 30.0–36.0)
MCV: 89.9 fL (ref 80.0–100.0)
Monocytes Absolute: 1.4 10*3/uL — ABNORMAL HIGH (ref 0.1–1.0)
Monocytes Relative: 14 %
Neutro Abs: 7.2 10*3/uL (ref 1.7–7.7)
Neutrophils Relative %: 74 %
Platelets: 216 10*3/uL (ref 150–400)
RBC: 4.97 MIL/uL (ref 4.22–5.81)
RDW: 13.1 % (ref 11.5–15.5)
WBC: 9.7 10*3/uL (ref 4.0–10.5)
nRBC: 0 % (ref 0.0–0.2)

## 2019-11-26 LAB — PROTIME-INR
INR: 1.1 (ref 0.8–1.2)
Prothrombin Time: 14.2 seconds (ref 11.4–15.2)

## 2019-11-26 LAB — BRAIN NATRIURETIC PEPTIDE: B Natriuretic Peptide: 41.3 pg/mL (ref 0.0–100.0)

## 2019-11-26 MED ORDER — ALBUTEROL SULFATE HFA 108 (90 BASE) MCG/ACT IN AERS
2.0000 | INHALATION_SPRAY | RESPIRATORY_TRACT | 0 refills | Status: DC | PRN
Start: 2019-11-26 — End: 2020-01-02

## 2019-11-26 MED ORDER — DOXYCYCLINE HYCLATE 100 MG PO CAPS: 100.0000 mg | ORAL_CAPSULE | Freq: Two times a day (BID) | ORAL | 0 refills | Status: AC

## 2019-11-26 MED ORDER — PREDNISONE 20 MG PO TABS: 40.0000 mg | ORAL_TABLET | Freq: Every day | ORAL | 0 refills | Status: AC

## 2019-11-26 MED ORDER — ALBUTEROL SULFATE HFA 108 (90 BASE) MCG/ACT IN AERS
4.0000 | INHALATION_SPRAY | Freq: Once | RESPIRATORY_TRACT | Status: AC
  Administered 2019-11-26: 4 via RESPIRATORY_TRACT
  Filled 2019-11-26: qty 6.7

## 2019-11-26 NOTE — Discharge Instructions (Signed)
Your laboratory results were within normal limits today.  I have prescribed a short course of antibiotics to help with your symptoms, please take 1 tablet twice a day for the next 7 days.  I have also prescribed a short course of steroids, please take 2 tablets daily for the next 5 days.  Be aware this medication can cause flushness, insomnia, changes in appetite.  A thorough prescription for an inhaler was also provided, please use this as needed for your shortness of breath.  Please continue to follow-up with Dr. Earlie Server as needed.

## 2019-11-26 NOTE — ED Provider Notes (Signed)
  Face-to-face evaluation   History: Presents for evaluation of shortness of breath, with cough productive of sputum, for 2 weeks.  History of emphysema not requiring treatment previously.  No fever, chills, vomiting or dizziness.  Physical exam: Alert, calm and cooperative.  No respiratory distress.  Lungs with few wheezes and rhonchi right midlung zone.  Left lung clear.  Evaluation consistent with bronchitis likely induced by combination of factors including postradiation status, emphysema, and environmental allergies.  Medical screening examination/treatment/procedure(s) were conducted as a shared visit with non-physician practitioner(s) and myself.  I personally evaluated the patient during the encounter    Daleen Bo, MD 11/26/19 [phone]

## 2019-11-26 NOTE — ED Triage Notes (Signed)
Pt reports that had exertional SOB, having to constantly clear his airway (coughing up greenish and clear phlegm), and wheezing since Thursday. Has lung cancer and emphysema.

## 2019-11-26 NOTE — ED Provider Notes (Signed)
[facility] DEPT Provider Note   CSN: [id number] Arrival date & time: 11/26/19  4765     History Chief Complaint  Patient presents with  . Shortness of Breath  . Cough    Brian [name] is a 60 y.o. male.  59 y.o male with a PMH of DM, subarachnoid, Stage IIIB (T1c, N3, M0) non-small cell lung cancer, adenocarcinoma presented with right upper lobe lung mass  prior blood clots on xarelto presents to the ED with a chief complaint of difficulty breathing x 3 days. He is currently being treated by her oncologist Dr. Julien Nordmann, reports he completed a round of immunotherapy this month.  He does report the shortness of breath has began bothering him for the next 3 days, described this as a heavy sensation to the right chest, feels like something is pressing down on his chest.  This is exacerbated with lying down flat, reports it is improved by sitting up along with standing.  Also endorses a cough, reports is varies between dry and productive with clear phlegm. He denies any fever, n/v/d, prior history of heart failure.   The history is provided by the patient and medical records.  Shortness of Breath Associated symptoms: chest pain and cough   Associated symptoms: no abdominal pain, no fever, no headaches, no sore throat and no vomiting   Cough Associated symptoms: chest pain and shortness of breath   Associated symptoms: no chills, no fever, no headaches and no sore throat        Past Medical History:  Diagnosis Date  . Asthma   . Diabetes mellitus without complication ([place])   . Hypertension   . nscl ca dx'd 08/2018  . Subarachnoid hemorrhage [facility])     Patient Active Problem List   Diagnosis Date Noted  . Venous thrombosis 09/21/2019  . Shortness of breath 09/07/2019  . Headache 08/09/2019  . Lobar pneumonia, unspecified organism ([place]) 03/07/2019  . Centrilobular emphysema ([place]) 03/07/2019  . Encounter for antineoplastic immunotherapy 11/10/2018  .  Adenocarcinoma of right lung, stage 3 ([place]) 08/25/2018  . Goals of care, counseling/discussion 08/25/2018  . Encounter for antineoplastic chemotherapy 08/25/2018  . Mass of upper lobe of right lung 08/04/2018  . Subarachnoid hemorrhage ([place]) 05/22/2015    Past Surgical History:  Procedure Laterality Date  . LEG SURGERY  age 70    left leg, from fracture       Family History  Problem Relation Age of Onset  . Colon cancer Neg Hx   . Stomach cancer Neg Hx     Social History   Tobacco Use  . Smoking status: Never Smoker  . Smokeless tobacco: Never Used  Substance Use Topics  . Alcohol use: Yes    Alcohol/week: 5.0 standard drinks    Types: 5 Standard drinks or equivalent per week    Comment: occasionally  . Drug use: No    Home Medications Prior to Admission medications   Medication Sig Start Date End Date Taking? Authorizing Provider  amLODipine (NORVASC) 5 MG tablet Take 5 mg by mouth daily.  07/20/18  Yes [provider]  Ascorbic Acid (VITAMIN C) 100 MG tablet Take 100 mg by mouth daily.   Yes [provider]  atorvastatin (LIPITOR) 20 MG tablet Take 20 mg by mouth daily.  12/10/17  Yes [provider]  cholecalciferol (VITAMIN D3) 25 MCG (1000 UNIT) tablet Take 1,000 Units by mouth daily.   Yes [provider]  metFORMIN (GLUCOPHAGE) 500 MG  tablet Take 500 mg by mouth daily with breakfast.  12/10/17  Yes [provider]  Multiple Vitamin (MULTIVITAMIN) tablet Take 1 tablet by mouth daily.   Yes [provider]  Propylene Glycol (SYSTANE BALANCE OP) Apply 1 drop to eye daily as needed (eye irritation).   Yes [provider]  rivaroxaban (XARELTO) 20 MG TABS tablet Take 1 tablet (20 mg total) by mouth daily with supper. 09/28/19  Yes Curt Bears, MD  venlafaxine (EFFEXOR) 37.5 MG tablet Take 37.5 mg by mouth daily.  12/10/17  Yes [provider]  albuterol (VENTOLIN HFA) 108 (90 Base) MCG/ACT inhaler  Inhale 2 puffs into the lungs every 4 (four) hours as needed for wheezing or shortness of breath. 11/26/19   Janeece Fitting, PA-C  doxycycline (VIBRAMYCIN) 100 MG capsule Take 1 capsule (100 mg total) by mouth 2 (two) times daily for 7 days. 11/26/19 12/03/19  Janeece Fitting, PA-C  predniSONE (DELTASONE) 20 MG tablet Take 2 tablets (40 mg total) by mouth daily for 5 days. 11/26/19 12/01/19  Janeece Fitting, PA-C  prochlorperazine (COMPAZINE) 10 MG tablet Take 1 tablet (10 mg total) by mouth every 6 (six) hours as needed for nausea or vomiting. Patient not taking: Reported on 11/26/2019 03/21/19   Gery Pray, MD  Respiratory Therapy Supplies (FLUTTER) DEVI Please use up 10 times daily(4-5 breaths, 4-5 times daily) 03/07/19   Lauraine Rinne, NP    Allergies    Patient has no known allergies.  Review of Systems   Review of Systems  Constitutional: Negative for chills and fever.  HENT: Negative for sore throat.   Respiratory: Positive for cough and shortness of breath.   Cardiovascular: Positive for chest pain. Negative for palpitations and leg swelling.  Gastrointestinal: Negative for abdominal pain, nausea and vomiting.  Genitourinary: Negative for flank pain.  Musculoskeletal: Negative for back pain.  Skin: Negative for pallor and wound.  Neurological: Negative for light-headedness and headaches.  All other systems reviewed and are negative.   Physical Exam Updated Vital Signs BP (!) 138/103   Pulse 94   Temp 98.4 F (36.9 C) (Oral)   Resp 15   SpO2 96%   Physical Exam Vitals and nursing note reviewed.  Constitutional:      Appearance: He is well-developed. He is not ill-appearing or toxic-appearing.  HENT:     Head: Normocephalic and atraumatic.  Cardiovascular:     Rate and Rhythm: Normal rate.     Comments: No BL pitting edema, no calf tenderness.  Pulmonary:     Effort: Pulmonary effort is normal.     Breath sounds: Examination of the right-upper field reveals decreased breath  sounds. Examination of the left-upper field reveals decreased breath sounds. Examination of the right-middle field reveals decreased breath sounds. Examination of the left-middle field reveals decreased breath sounds. Examination of the left-lower field reveals decreased breath sounds. Decreased breath sounds and wheezing present. No rhonchi or rales.  Chest:     Chest wall: No tenderness.  Abdominal:     Palpations: Abdomen is soft.     Tenderness: There is no abdominal tenderness.  Musculoskeletal:     Right lower leg: No tenderness. No edema.     Left lower leg: No tenderness. No edema.  Skin:    General: Skin is warm and dry.  Neurological:     Mental Status: He is alert and oriented to person, place, and time.     ED Results / Procedures / Treatments   Labs (  all labs ordered are listed, but only abnormal results are displayed) Labs Reviewed  CBC WITH DIFFERENTIAL/PLATELET - Abnormal; Notable for the following components:      Result Value   Monocytes Absolute 1.4 (*)    All other components within normal limits  COMPREHENSIVE METABOLIC PANEL  PROTIME-INR  BRAIN NATRIURETIC PEPTIDE    EKG EKG Interpretation  Date/Time:  Sunday November 26 2019 10:01:50 EDT Ventricular Rate:  111 PR Interval:    QRS Duration: 93 QT Interval:  323 QTC Calculation: 439 R Axis:   77 Text Interpretation: Sinus tachycardia Probable anteroseptal infarct, old No old tracing to compare Confirmed by Daleen Bo [phone]) on 11/26/2019 2:49:27 PM   Radiology DG Chest 2 View  Result Date: 11/26/2019 CLINICAL DATA:  Shortness of breath, right lung cancer EXAM: CHEST - 2 VIEW COMPARISON:  Chest radiograph, 01/06/2019, CT chest, 11/20/2019 FINDINGS: The heart size and mediastinal contours are within normal limits. Bandlike post treatment consolidation of the perihilar right lung is similar to prior examination. The visualized skeletal structures are unremarkable. IMPRESSION: No acute abnormality of the  lungs. Bandlike post treatment consolidation of the perihilar right lung is similar to prior examination. Electronically Signed   By: Eddie Candle M.D.   On: 11/26/2019 10:56    Procedures Procedures (including critical care time)  Medications Ordered in ED Medications  albuterol (VENTOLIN HFA) 108 (90 Base) MCG/ACT inhaler 4 puff (4 puffs Inhalation Given 11/26/19 1429)    ED Course  I have reviewed the triage vital signs and the nursing notes.  Pertinent labs & imaging results that were available during my care of the patient were reviewed by me and considered in my medical decision making (see chart for details).    MDM Rules/Calculators/A&P  Patient with a past medical history of stage III lung cancer, emphysema who presents to the ED with complaints of shortness of breath for the past 3 days.  Patient is currently on Xarelto for what he believes was a blood clot to his lungs.  States he has been taking this medication and is compliant with it and has not missed any doses.  He does report the shortness of breath is worse when lying down flat. No prior history of heart failure.  No swelling to bilateral legs, does report a dry to wet cough.  He is currently followed by Dr. Earlie Server of oncology, just finished an immunotherapy treatment.  Review of his chart extensively by me, with his last imaging study of a chest CT 5 days ago, results:  Radiation changes in the medial right upper lobe/perihilar region.  Peribronchial thickening with ground-glass opacity in the superior segment right lower lobe, with mildly progressive nodularity. Attention on follow-up is suggested to exclude recurrence.  New 7 mm short axis gastrohepatic node, indeterminate.  Labs reviewed by me, CBC without any leukocytosis, hemoglobin is within normal limits. CMP within any electrolyte abnormality, creatine is within normal limits. BNP is negative, no clinical signs of heart failure. PT/INR is within normal  limits. He is currently on xarelto, lower suspicion for PE although consider no hypoxia or tachycardia on today's visit.  I have discussed case with Dr. Eulis Foster, he is also evaluated patient and agrees with plan and management at this time.  Return precautions discussed at length, patient stable for discharge.   Portions of this note were generated with Lobbyist. Dictation errors may occur despite best attempts at proofreading.  Final Clinical Impression(s) / ED Diagnoses Final diagnoses:  Shortness  of breath  Bronchitis    Rx / DC Orders ED Discharge Orders         Ordered    albuterol (VENTOLIN HFA) 108 (90 Base) MCG/ACT inhaler  Every 4 hours PRN     11/26/19 1513    predniSONE (DELTASONE) 20 MG tablet  Daily     11/26/19 1513    doxycycline (VIBRAMYCIN) 100 MG capsule  2 times daily     11/26/19 1513           Janeece Fitting, PA-C 11/26/19 1520    Daleen Bo, MD 11/26/19 1952

## 2019-11-27 ENCOUNTER — Other Ambulatory Visit: Payer: Self-pay | Admitting: Physician Assistant

## 2019-11-27 DIAGNOSIS — C3491 Malignant neoplasm of unspecified part of right bronchus or lung: Secondary | ICD-10-CM

## 2019-12-13 ENCOUNTER — Encounter: Payer: Self-pay | Admitting: Internal Medicine

## 2019-12-14 ENCOUNTER — Telehealth: Payer: Self-pay | Admitting: *Deleted

## 2019-12-14 ENCOUNTER — Other Ambulatory Visit: Payer: Self-pay

## 2019-12-14 ENCOUNTER — Inpatient Hospital Stay: Payer: BC Managed Care – PPO | Attending: Internal Medicine | Admitting: Medical

## 2019-12-14 VITALS — BP 118/81 | HR 101 | Temp 98.7°F | Resp 18 | Ht 68.0 in | Wt 193.2 lb

## 2019-12-14 DIAGNOSIS — J45909 Unspecified asthma, uncomplicated: Secondary | ICD-10-CM | POA: Insufficient documentation

## 2019-12-14 DIAGNOSIS — E119 Type 2 diabetes mellitus without complications: Secondary | ICD-10-CM | POA: Diagnosis not present

## 2019-12-14 DIAGNOSIS — R21 Rash and other nonspecific skin eruption: Secondary | ICD-10-CM | POA: Diagnosis not present

## 2019-12-14 DIAGNOSIS — C3491 Malignant neoplasm of unspecified part of right bronchus or lung: Secondary | ICD-10-CM

## 2019-12-14 DIAGNOSIS — Z9221 Personal history of antineoplastic chemotherapy: Secondary | ICD-10-CM | POA: Diagnosis not present

## 2019-12-14 DIAGNOSIS — I10 Essential (primary) hypertension: Secondary | ICD-10-CM | POA: Diagnosis not present

## 2019-12-14 DIAGNOSIS — C770 Secondary and unspecified malignant neoplasm of lymph nodes of head, face and neck: Secondary | ICD-10-CM | POA: Insufficient documentation

## 2019-12-14 DIAGNOSIS — C3411 Malignant neoplasm of upper lobe, right bronchus or lung: Secondary | ICD-10-CM | POA: Insufficient documentation

## 2019-12-14 DIAGNOSIS — Z923 Personal history of irradiation: Secondary | ICD-10-CM | POA: Diagnosis not present

## 2019-12-14 MED ORDER — TRIAMCINOLONE ACETONIDE 0.1 % EX LOTN: 1.0000 "application " | TOPICAL_LOTION | Freq: Four times a day (QID) | CUTANEOUS | 2 refills | Status: DC

## 2019-12-14 NOTE — Telephone Encounter (Signed)
Received MyChart message from patient regarding a rash that seems to be getting worse: "The rash that began to show up on my neck toward the end of my Immunotherapy treatments has not gone away; in fact,it seems to be worse, moving down my chest a bit and actually swelling into what looks like welts in a couple spots."  Pt to be seen in [facility] today @ 2:30 pm.  Sandi Mealy, PA and pt aware and agreeable to this plan.

## 2019-12-14 NOTE — Patient Instructions (Signed)

## 2019-12-14 NOTE — Progress Notes (Signed)
Pt seen by PA Lucianne Lei only, no assessment by [facility] RN at this time (time constraints).  PA aware.

## 2019-12-15 NOTE — Progress Notes (Signed)
Symptoms Management Clinic Progress Note   Brian [name] [id number] 1961-03-19 59 y.o.  Brian [name] is managed by Dr. Fanny Bien. Mohamed  Actively treated with chemotherapy/immunotherapy/hormonal therapy: no  Last treated: 11/02/2019 (cycle 26 of Imfinzi)  Next scheduled appointment with provider: 01/24/2020  Assessment: Plan:    Rash  Adenocarcinoma of right lung, stage 3 (HCC)   Rash of the right lateral neck: The rash of the patient's right lateral neck appears to be radiation recall.  He was given a prescription for triamcinolone lotion.  Stage III adenocarcinoma of the right lung: The patient is followed by Dr. Julien Nordmann and is being followed with observation.  He is status post cycle 26 of Imfinzi which she completed on 11/02/2019.  He is scheduled to return for follow-up on 01/24/2020.  Please see After Visit Summary for patient specific instructions.  Future Appointments  Date Time Provider [place]  12/19/2019  3:30 PM [name], MD [facility] None  01/22/2020  9:00 AM [facility] LAB 3 [facility] None  01/24/2020  9:30 AM Heilingoetter, Cassandra L, PA-C [facility] None    No orders of the defined types were placed in this encounter.      Subjective:   Patient ID:  Brian [name] is a 59 y.o. (DOB 06/02/1961) male.  Chief Complaint:  Chief Complaint  Patient presents with   Rash    HPI Brian [name]  is a 59 y.o. male with a diagnosis of stage III adenocarcinoma of the lung who is managed with observation by Dr. Julien Nordmann.  Presents to the clinic today most recently having been treated with Imfinzi with cycle 26 dosed on 11/02/2019.  He is currently managed with observation.  He presents with a rash over his right lateral neck and an area consistent with a radiation field.  He notes thickened tissue, erythema, and several pustules in that area.  He has had no changes in activity.  He reports that he uses sunscreen and attempts to avoid  sun exposure.  He denies any recent sun exposure.  Medications: I have reviewed the patient's current medications.  Allergies: No Known Allergies  Past Medical History:  Diagnosis Date   Asthma    Diabetes mellitus without complication ([place])    Hypertension    nscl ca dx'd 08/2018   Subarachnoid hemorrhage [facility])     Past Surgical History:  Procedure Laterality Date   LEG SURGERY  age 30    left leg, from fracture    Family History  Problem Relation Age of Onset   Colon cancer Neg Hx    Stomach cancer Neg Hx     Social History   Socioeconomic History   Marital status: Married    Spouse name: Not on file   Number of children: Not on file   Years of education: Not on file   Highest education level: Not on file  Occupational History   Not on file  Tobacco Use   Smoking status: Never Smoker   Smokeless tobacco: Never Used  Substance and Sexual Activity   Alcohol use: Yes    Alcohol/week: 5.0 standard drinks    Types: 5 Standard drinks or equivalent per week    Comment: occasionally   Drug use: No   Sexual activity: Not on file  Other Topics Concern   Not on file  Social History Narrative   Not on file   Social Determinants of Health   Financial Resource Strain:    Difficulty of Paying Living Expenses:  Food Insecurity:    Worried About Charity fundraiser in the Last Year:    Arboriculturist in the Last Year:   Transportation Needs:    Film/video editor (Medical):    Lack of Transportation (Non-Medical):   Physical Activity:    Days of Exercise per Week:    Minutes of Exercise per Session:   Stress:    Feeling of Stress :   Social Connections:    Frequency of Communication with Friends and Family:    Frequency of Social Gatherings with Friends and Family:    Attends Religious Services:    Active Member of Clubs or Organizations:    Attends Music therapist:    Marital Status:   Intimate Partner  Violence:    Fear of Current or Ex-Partner:    Emotionally Abused:    Physically Abused:    Sexually Abused:     Past Medical History, Surgical history, Social history, and Family history were reviewed and updated as appropriate.   Please see review of systems for further details on the patient's review from today.   Review of Systems:  Review of Systems  Constitutional: Negative for chills, diaphoresis and fever.  HENT: Negative for facial swelling and trouble swallowing.   Respiratory: Negative for cough, chest tightness and shortness of breath.   Cardiovascular: Negative for chest pain.  Skin: Positive for rash.    Objective:   Physical Exam:  BP 118/81 (BP Location: Left Arm, Patient Position: Sitting)    Pulse (!) 101    Temp 98.7 F (37.1 C) (Temporal)    Resp 18    Ht 5\' 8"  (1.727 m)    Wt 87.6 kg (193 lb 3.2 oz)    SpO2 98%    BMI 29.38 kg/m  ECOG: 0  Physical Exam Constitutional:      General: He is not in acute distress.    Appearance: Normal appearance. He is not ill-appearing.  HENT:     Head: Normocephalic and atraumatic.  Neck:   Musculoskeletal:     Cervical back: No tenderness.  Lymphadenopathy:     Cervical: No cervical adenopathy.  Neurological:     Mental Status: He is alert.     Coordination: Coordination normal.     Gait: Gait normal.  Psychiatric:        Mood and Affect: Mood normal.        Behavior: Behavior normal.        Thought Content: Thought content normal.        Judgment: Judgment normal.     Lab Review:     Component Value Date/Time   NA 139 11/26/2019 1319   K 4.2 11/26/2019 1319   CL 106 11/26/2019 1319   CO2 25 11/26/2019 1319   GLUCOSE 94 11/26/2019 1319   BUN 14 11/26/2019 1319   CREATININE 0.81 11/26/2019 1319   CREATININE 1.15 11/22/2019 1431   CALCIUM 9.1 11/26/2019 1319   PROT 6.8 11/26/2019 1319   ALBUMIN 3.9 11/26/2019 1319   AST 24 11/26/2019 1319   AST 26 11/22/2019 1431   ALT 27 11/26/2019 1319    ALT 40 11/22/2019 1431   ALKPHOS 90 11/26/2019 1319   BILITOT 0.4 11/26/2019 1319   BILITOT <0.2 (L) 11/22/2019 1431   GFRNONAA >60 11/26/2019 1319   GFRNONAA >60 11/22/2019 1431   GFRAA >60 11/26/2019 1319   GFRAA >60 11/22/2019 1431       Component Value Date/Time  WBC 9.7 11/26/2019 1319   RBC 4.97 11/26/2019 1319   HGB 14.8 11/26/2019 1319   HGB 14.3 11/22/2019 1431   HCT 44.7 11/26/2019 1319   PLT 216 11/26/2019 1319   PLT 213 11/22/2019 1431   MCV 89.9 11/26/2019 1319   MCH 29.8 11/26/2019 1319   MCHC 33.1 11/26/2019 1319   RDW 13.1 11/26/2019 1319   LYMPHSABS 0.7 11/26/2019 1319   MONOABS 1.4 (H) 11/26/2019 1319   EOSABS 0.3 11/26/2019 1319   BASOSABS 0.1 11/26/2019 1319   -------------------------------  Imaging from last 24 hours (if applicable):  Radiology interpretation: DG Chest 2 View  Result Date: 11/26/2019 CLINICAL DATA:  Shortness of breath, right lung cancer EXAM: CHEST - 2 VIEW COMPARISON:  Chest radiograph, 01/06/2019, CT chest, 11/20/2019 FINDINGS: The heart size and mediastinal contours are within normal limits. Bandlike post treatment consolidation of the perihilar right lung is similar to prior examination. The visualized skeletal structures are unremarkable. IMPRESSION: No acute abnormality of the lungs. Bandlike post treatment consolidation of the perihilar right lung is similar to prior examination. Electronically Signed   By: Eddie Candle M.D.   On: 11/26/2019 10:56   CT Soft Tissue Neck W Contrast  Result Date: 11/21/2019 CLINICAL DATA:  Adenocarcinoma of right lung, stage III. Restaging lung cancer, history of lymph nodes in neck. Additional history provided: XRT to neck and chest, immunotherapy, chemotherapy, change in texture to right lateral neck with occasional (rash like) itching. EXAM: CT NECK WITH CONTRAST TECHNIQUE: Multidetector CT imaging of the neck was performed using the standard protocol following the bolus administration of  intravenous contrast. CONTRAST:  52mL OMNIPAQUE IOHEXOL 300 MG/ML  SOLN COMPARISON:  Neck CT 07/26/2019, neck CT 04/18/2019 FINDINGS: Pharynx and larynx: No appreciable swelling or discrete mass within the oral cavity, pharynx or larynx. Salivary glands: No inflammation, mass, or stone. Thyroid: Unremarkable. Lymph nodes: A cluster of subcentimeter right level III/IV lymph nodes has a more matted and indistinct appearance as compared to exam 07/26/2019 (series 603, image 42). However, the individual nodes do not appear significantly changed in size. Right level V lymph nodes have decreased in size, now measuring up to 1 cm (previously 1.1 cm) (series 603, image 70). Interval increase in size of nonspecific subcentimeter left level III/IV lymph nodes measuring up to 0.5 cm in short axis (for instance as seen on series 603, image 45). No pathologically enlarged or suspicious lymph nodes demonstrated elsewhere within the neck. Vascular: The major vascular structures of the neck appear patent. Limited intracranial: No abnormality identified. Visualized orbits: Incompletely imaged. Visualized portions unremarkable. Mastoids and visualized paranasal sinuses: No significant paranasal sinus disease or mastoid effusion at the imaged levels. Skeleton: No acute bony abnormality or aggressive osseous lesion. Upper chest: Reported separately. Other: Mild inflammatory stranding and skin thickening within the right neck soft tissues which may reflect sequela of reported radiation therapy. IMPRESSION: 1. A cluster of subcentimeter right level III/IV lymph nodes has a more matted and indistinct appearance as compared to 07/26/2019. However, the individual nodes do not appear significantly changed in size. 2. Right level V lymph nodes have decreased in size, now measuring up to 1 cm. 3. Interval increase in size of left level III/IV lymph nodes measuring subcentimeter in short axis, nonspecific. Attention is recommended on follow-up.  4. No pathologically enlarged or suspicious lymph nodes demonstrated elsewhere within the neck. 5. Mild soft tissue stranding and skin thickening within the right neck, likely reflecting sequela of reported radiation therapy. Electronically Signed  By: Kellie Simmering DO   On: 11/21/2019 09:11   CT Chest W Contrast  Result Date: 11/21/2019 CLINICAL DATA:  Metastatic lung cancer, status post chemotherapy and XRT, immunotherapy complete 2 weeks ago EXAM: CT CHEST WITH CONTRAST TECHNIQUE: Multidetector CT imaging of the chest was performed during intravenous contrast administration. CONTRAST:  18mL OMNIPAQUE IOHEXOL 300 MG/ML  SOLN COMPARISON:  09/08/2019 FINDINGS: Cardiovascular: The heart is normal in size. No pericardial effusion. No evidence of thoracic aortic aneurysm. Mild atherosclerotic calcifications of the aortic arch. Mild coronary atherosclerosis of the LAD. Mediastinum/Nodes: 8 mm short axis subcarinal node, grossly unchanged. No suspicious hilar or axillary lymphadenopathy. Visualized thyroid is unremarkable. Lungs/Pleura: Radiation changes in the medial right upper lobe/perihilar region. Peribronchial thickening in the superior segment right lower lobe, with associated ground-glass opacity and mild nodularity, mildly progressive. Associated 15 x 23 mm nodule in the superior segment right lower lobe (series 6/image 65). Trace right pleural effusion. Left lung is clear. No pneumothorax. Upper Abdomen: Visualized upper abdomen is notable for a 7 mm short axis gastrohepatic node (series 3/image 84), new. Musculoskeletal: Mild degenerative changes of the visualized thoracolumbar spine. IMPRESSION: Radiation changes in the medial right upper lobe/perihilar region. Peribronchial thickening with ground-glass opacity in the superior segment right lower lobe, with mildly progressive nodularity. Attention on follow-up is suggested to exclude recurrence. New 7 mm short axis gastrohepatic node, indeterminate.  Aortic Atherosclerosis (ICD10-I70.0). Electronically Signed   By: Julian Hy M.D.   On: 11/21/2019 12:05

## 2019-12-19 ENCOUNTER — Ambulatory Visit (INDEPENDENT_AMBULATORY_CARE_PROVIDER_SITE_OTHER): Payer: BC Managed Care – PPO | Admitting: Pulmonary Disease

## 2019-12-19 ENCOUNTER — Encounter: Payer: Self-pay | Admitting: Pulmonary Disease

## 2019-12-19 ENCOUNTER — Other Ambulatory Visit: Payer: Self-pay

## 2019-12-19 DIAGNOSIS — R053 Chronic cough: Secondary | ICD-10-CM

## 2019-12-19 DIAGNOSIS — J432 Centrilobular emphysema: Secondary | ICD-10-CM | POA: Diagnosis not present

## 2019-12-19 DIAGNOSIS — R05 Cough: Secondary | ICD-10-CM

## 2019-12-19 MED ORDER — PREDNISONE 10 MG PO TABS
10.0000 mg | ORAL_TABLET | Freq: Every day | ORAL | 0 refills | Status: DC
Start: 2019-12-19 — End: 2020-02-13

## 2019-12-19 NOTE — Progress Notes (Signed)
   Subjective:    Patient ID: Brian [name], male    DOB: 1961/06/11, 59 y.o.   MRN: [id number]  HPI  59 year old remote smoker presented with hypermetabolic right upper lobe nodule with mediastinal and supraclavicular lymphadenopathy, biopsy showed metastatic adenocarcinoma PDL 1 expression was 10% and unfortunately had no actionable mutations  He completed concurrent chemoradiation followed by immunotherapy-last 4/1  and radiation to right cervical lymph node.   Reviewed last oncology note from 4/21 and repeat CT surveillance is planned in 2 months.  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  Radiation pneumonitis He reports chronic cough for about a year, worse in the past couple of weeks.  He has had 2 blackouts first episode was while watching TV and had a bout of coughing followed by blackout for a few seconds, second episode was while reading in bed.  He has been using Mucinex over-the-counter without any relief. He generally sleeps on his left side.  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  CT chest without contrast 07/25/18  which showed mild changes of emphysema, 2.8 cm solid nodule with spiculated margins extending to the pleura of the right upper lobe and 10.5 mm precarinal lymph node   PET 01/6598 hypermetabolic right upper lobe nodule with hypermetabolism in mediastinal, subcarinal and bilateral supraclavicular lymph nodes  PFTs 08/2018 nml   Review of Systems neg for any significant sore throat, dysphagia,  itching, sneezing, nasal congestion or excess/ purulent secretions, fever, chills, sweats, unintended wt loss, pleuritic or exertional cp, hempoptysis, orthopnea pnd or change in chronic leg swelling. Also denies presyncope, palpitations, heartburn, abdominal pain, nausea, vomiting, diarrhea or change in bowel or urinary habits, dysuria,hematuria, rash, arthralgias, visual complaints, headache, numbness weakness or ataxia.     Objective:   Physical Exam  Gen. Pleasant, well-nourished, in no distress, normal affect ENT - no pallor,icterus, no post nasal drip Neck: No JVD, no thyromegaly, no carotid bruits Lungs: no use of accessory muscles, no dullness to percussion, clear without rales or rhonchi  Cardiovascular: Rhythm regular, heart sounds  normal, no murmurs or gallops, no peripheral edema SKin-right neck erythema Musculoskeletal: No deformities, no cyanosis or clubbing Neuro:  alert, non focal       Assessment & Plan:

## 2019-12-19 NOTE — Patient Instructions (Addendum)
Prednisone 10 mg tabs Take 4 tabs  daily with food x 4 days, then 3 tabs daily x 4 days, then 2 tabs daily x 4 days, then 1 tab daily x4 days then stop. #40  STOP mucinex Take Delsym cough surup onstead 5-10 ml thrice daily x 2 weeks , then as needed  Albuterol 2 puffs q 6h as needed

## 2019-12-20 DIAGNOSIS — R053 Chronic cough: Secondary | ICD-10-CM | POA: Insufficient documentation

## 2019-12-20 NOTE — Assessment & Plan Note (Signed)
Albuterol 2 puffs q 6h as needed If persistent cough, will trial steroid/LABA combination

## 2019-12-20 NOTE — Assessment & Plan Note (Addendum)
No clear cause is identified he does have changes of radiation treatment in the right lung-he is far out from his radiation therapy so radiation pneumonitis seems less likely  Prednisone 10 mg tabs Take 4 tabs  daily with food x 4 days, then 3 tabs daily x 4 days, then 2 tabs daily x 4 days, then 1 tab daily x4 days then stop. #40  STOP mucinex Take Delsym cough surup onstead 5-10 ml thrice daily x 2 weeks , then as needed  No obvious other triggers of upper airway/GERD, CT neck reviewed normal. If persistent, will trial PPI for 4 weeks

## 2020-01-02 ENCOUNTER — Other Ambulatory Visit: Payer: Self-pay | Admitting: Pulmonary Disease

## 2020-01-02 MED ORDER — ALBUTEROL SULFATE HFA 108 (90 BASE) MCG/ACT IN AERS: 2.0000 | INHALATION_SPRAY | RESPIRATORY_TRACT | 5 refills | Status: DC | PRN

## 2020-01-04 ENCOUNTER — Encounter: Payer: Self-pay | Admitting: Internal Medicine

## 2020-01-04 ENCOUNTER — Other Ambulatory Visit: Payer: Self-pay | Admitting: Medical Oncology

## 2020-01-04 DIAGNOSIS — I8290 Acute embolism and thrombosis of unspecified vein: Secondary | ICD-10-CM

## 2020-01-04 MED ORDER — RIVAROXABAN 20 MG PO TABS: 20.0000 mg | ORAL_TABLET | Freq: Every day | ORAL | 2 refills | Status: DC

## 2020-01-05 ENCOUNTER — Encounter: Payer: Self-pay | Admitting: Hematology

## 2020-01-05 ENCOUNTER — Other Ambulatory Visit: Payer: Self-pay | Admitting: Pulmonary Disease

## 2020-01-08 ENCOUNTER — Other Ambulatory Visit: Payer: Self-pay | Admitting: Medical Oncology

## 2020-01-09 ENCOUNTER — Telehealth: Payer: Self-pay | Admitting: Pulmonary Disease

## 2020-01-09 DIAGNOSIS — C3491 Malignant neoplasm of unspecified part of right bronchus or lung: Secondary | ICD-10-CM | POA: Diagnosis not present

## 2020-01-09 DIAGNOSIS — J439 Emphysema, unspecified: Secondary | ICD-10-CM | POA: Diagnosis not present

## 2020-01-09 NOTE — Telephone Encounter (Signed)
Brian [name] has opening tomorrow for virtual   Brian [name] has in person visit open tomorrow, will have to check with her to see if would see as 15 min slot.   If worse will need urgernt care or ER visit   Please contact office for sooner follow up if symptoms do not improve or worsen or seek emergency care

## 2020-01-09 NOTE — Telephone Encounter (Signed)
Spoke with pt. States that last night he "lost his ability to breathe." Reports, "For about a minute I could not breathe. There wasn't any air coming in or going out." I attempted to get more information from the pt but he kept repeat the above information. States that this happened a few years ago as well. Denies chest tightness, wheezing, coughing. Pt would like an appointment in office but we do not have any available for the rest of the week.  Tammy - please advise. Thanks.

## 2020-01-09 NOTE — Telephone Encounter (Signed)
Spoke with pt, states that he saw his PCP this morning for this issue, was prescribed Trelegy.  States that he wants to try this and declined an OV at this time.  I advised pt to call our office back if Trelegy doesn't seem to help, and advised to present to UC/ED if s/s worsen.  Pt expressed understanding.  Nothing further needed at this time- will close encounter.

## 2020-01-10 ENCOUNTER — Encounter: Payer: Self-pay | Admitting: Internal Medicine

## 2020-01-11 ENCOUNTER — Encounter: Payer: Self-pay | Admitting: Medical Oncology

## 2020-01-12 ENCOUNTER — Telehealth: Payer: Self-pay | Admitting: *Deleted

## 2020-01-12 NOTE — Telephone Encounter (Signed)
Called patient to inform of fu with Dr. Sondra Come on 01-16-20 @ 11:15 am, lvm for a return call

## 2020-01-15 ENCOUNTER — Telehealth: Payer: Self-pay | Admitting: Medical Oncology

## 2020-01-15 NOTE — Progress Notes (Signed)
Radiation Oncology         [phone] ________________________________  Name: Brian [name] MRN: [id number]  Date: 01/16/2020  DOB: 06/20/1961  Follow-Up Visit Note  CC: Brian Broker, MD  Brian Broker, MD    ICD-10-CM   1. Adenocarcinoma of right lung, stage 3 (HCC)  C34.91 silver sulfADIAZINE (SILVADENE) 1 % cream    Diagnosis: Stage IIIB (T1c, N3, M0)non-small cell lung cancer, adenocarcinoma presented with right upper lobe lung mass in addition to mediastinal and bilateral supraclavicular lymphadenopathy diagnosed in January 2020  Interval Since Last Radiation: Nine months and five days.  03/02/2019 through 04/13/2019  Site Technique Total Dose Dose per Fx Completed Fx Beam Energies  Head & neck: HN_Rt IMRT 60/60 2 30/30 6X   Narrative:  The patient returns today for unscheduled follow-up. Restaging CT of chest on 07/26/2019 showed stable right perihilar radiation fibrosis and non-specific small clustered nodular foci along the posterior and inferior margins of the radiation fibrosis, no appreciably changed since 04/18/2019, warranting continued close follow-up. There were no new potential findings of metastatic disease in the chest. The thoracic lymph nodes remained sub-centimeter. Soft tissue neck CT on 07/26/2019 showed a decrease in the cluster of right level 5 lymph nodes. There was stable to minimal (1 mm) increase in size of the small right level 3 and level 4 nodes.  The patient underwent a head CT scan on 08/09/2019 for evaluation of six-day history of headaches, lightheadedness, and dizziness. Results were negative; there was no evidence of acute intracranial abnormality or metastatic disease.  The patient underwent a restaging chest CT scan on 09/08/2019 secondary to increased dyspnea and cough. Results showed unchanged post-treatment/post-radiation changes of the perihilar right lung with unchanged nodularity of the adjacent superior segment right lower lobe  and about the major fissure. 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 There were no acute findings to explain the patient's symptoms.  Of note, the patient received his COVID-19 immunizations on 10/07/2019 and 10/28/2019.  He completed consolidation treatment with immunotherapy with Imfinzi on 11/02/2019, status post 26 cycles, under the care of Dr. Julien Nordmann. He tolerated treatment relatively well with the exception of a mild skin rash on the right side of the neck that was treated with Hydrocortisone cream.  The patient underwent a restaging chest CT scan on 11/20/2019. Results showed radiation changes in the medial right upper lobe/perihilar region, peri-bronchial thickening with ground-glass opacity in the superior segment of the right lower lobe with mildly progressive nodularity, and a new indeterminate 7 mm short axis gastrohepatic node. Soft tissue neck CT scan on 11/20/2019 showed a cluster of sub-centimeter right level III/IV lymph nodes with a more mattered and indistinct appearance when compared to 07/26/2019. However, the individual nodes did not appear significantly changed in size. The right level V lymph nodes had decreased in size and measured 1 cm. There was an interval increase in size of the left level III/IV lymph nodes that measured sub-centimeter in short axis and were non-specific. There were no pathologically enlarged or suspicious lymph nodes demonstrated elsewhere within the neck. Finally, there was some mild soft tissue stranding and skin thickening within the right neck that likely reflected sequela of reported radiation therapy.  The patient was last seen by [facility], PA-C, on 11/22/2019. At that time, it was recommended that the patient continue on observation with a shorter interval follow-up with  a repeat CT scan of the neck and chest in two months.  Given that the findings were below pathologic size, Dr. Julien Nordmann did not recommend a PET scan at that time, however this may be considered in the future if the above findings enlarge in the interval.  Of note, the patient was seen in the ED on 11/26/2019 with complaints of shortness of breath and chest heaviness. Chest x-ray at that time did not show any acute abnormalities of the lungs. The band-like post-treatment consolidation of the perihilar right lung ws similar to prior examination. Following the work-up, which was essentially unremarkable, the patient was discharged home with diagnoses of shortness of breath and bronchitis.  The patient was seen by Sandi Mealy, PA-C, on 12/14/2019 for worsening recall dermatitis on the right lateral neck. He was prescribed Triamcinolone lotion.  Patient reports that the triamcinolone cream did not help with this skin reaction along the right neck.  He did try sonofine leftover from his radiation treatments which seemed to help but has temporarily run out of this medication.  On review of systems, he reports neck pain that is described as a pinched nerve. He also reports moderate fatigue that improves after napping, dry cough related to emphysema, shortness of breath with exertion, and a rash on the right side of his neck. He denies hemoptysis, pain when swallowing, and difficulty swallowing.  He has noticed daily headaches which are of concern to him.  He denies any visual difficulties.  He reports seeking out a second opinion with a pulmonologist at [facility] [place] facility to assist with his breathing issues  ALLERGIES:  has No Known Allergies.  Meds: Current Outpatient Medications  Medication Sig Dispense Refill  . albuterol (VENTOLIN HFA) 108 (90 Base) MCG/ACT inhaler Inhale 2 puffs into the lungs every 4 (four) hours as needed for wheezing or shortness of breath. 6.7 g 5  . amLODipine (NORVASC) 5 MG tablet Take 5 mg by mouth daily.     . Ascorbic  Acid (VITAMIN C) 100 MG tablet Take 100 mg by mouth daily.    Marland Kitchen atorvastatin (LIPITOR) 20 MG tablet Take 20 mg by mouth daily.     . cholecalciferol (VITAMIN D3) 25 MCG (1000 UNIT) tablet Take 1,000 Units by mouth daily.    . metFORMIN (GLUCOPHAGE) 500 MG tablet Take 500 mg by mouth daily with breakfast.     . Multiple Vitamin (MULTIVITAMIN) tablet Take 1 tablet by mouth daily.    . prochlorperazine (COMPAZINE) 10 MG tablet Take 1 tablet (10 mg total) by mouth every 6 (six) hours as needed for nausea or vomiting. 30 tablet 0  . Propylene Glycol (SYSTANE BALANCE OP) Apply 1 drop to eye daily as needed (eye irritation).    Marland Kitchen Respiratory Therapy Supplies (FLUTTER) DEVI Please use up 10 times daily(4-5 breaths, 4-5 times daily) 1 each 0  . rivaroxaban (XARELTO) 20 MG TABS tablet Take 1 tablet (20 mg total) by mouth daily with supper. 30 tablet 2  . TRELEGY ELLIPTA 100-62.5-25 MCG/INH AEPB Inhale 1 puff into the lungs daily.    Marland Kitchen triamcinolone lotion (KENALOG) 0.1 % Apply 1 application topically 4 (four) times daily. 120 mL 2  . venlafaxine (EFFEXOR) 37.5 MG tablet Take 37.5 mg by mouth daily.     . predniSONE (DELTASONE) 10 MG tablet Take 1 tablet (10 mg total) by mouth daily with breakfast. Take 4 tabs daily with food or 4 days, then 3 tabs daily for 4 days, then 2  tabs daily for 4 days, then 1 tab daily for 4 days, then stop 40 tablet 0   Current Facility-Administered Medications  Medication Dose Route Frequency Provider Last Rate Last Admin  . silver sulfADIAZINE (SILVADENE) 1 % cream   Topical Daily Gery Pray, MD   Given at 01/16/20 1318    Physical Findings: The patient is in no acute distress. Patient is alert and oriented.  weight is 186 lb (84.4 kg). His oral temperature is 97.8 F (36.6 C). His blood pressure is 132/92 (abnormal) and his pulse is 102 (abnormal). His respiration is 18 and oxygen saturation is 97%. .   Lungs are clear to auscultation bilaterally. Heart has regular rate  and rhythm. No palpable cervical, supraclavicular, or axillary adenopathy. Abdomen soft, non-tender, normal bowel sounds.  Patient has a plaque-like dermatitis along the right lower neck extending into the right upper chest region.  Has some moderate appearance of allergic reaction.  Conceivably this could be tumor recurrence along the dermis.  Some areas showing very early open drainage.     Lab Findings: Lab Results  Component Value Date   WBC 9.7 11/26/2019   HGB 14.8 11/26/2019   HCT 44.7 11/26/2019   MCV 89.9 11/26/2019   PLT 216 11/26/2019    Radiographic Findings: No results found.  Impression: Stage IIIB (T1c, N3, M0)non-small cell lung cancer, adenocarcinoma presented with right upper lobe lung mass in addition to mediastinal and bilateral supraclavicular lymphadenopathy diagnosed in January 2020  The etiology of the patient's rash along the right neck and upper chest is unknown.  Conceivably this could be radiation recall although no particular reason why he would develop this 9 months out from his radiation treatment and his chemotherapy has been several months ago.  Patient denies placing any new sunscreen on this area or other type creams of the above-mentioned.  Patient has been given Silvadene to place on the area and will provide sonafine to the patient once this is available again in our clinic.  Triamcinolone cream did not seem to help the patient.  Headaches are of concern and the patient will be scheduled for an MRI of the brain with and without contrast in the near future.  Plan: The patient is scheduled for chest CT scan and CT of soft tissue neck on 01/22/2020. He is scheduled to see Cassandra Heilingoetter, PA-C, on 01/24/2020.  Brain MRI ordered today  ____________________________________   Blair Promise, PhD, MD This document serves as a record of services personally performed by Gery Pray, MD. It was created on his behalf by Clerance Lav, a trained medical  scribe. The creation of this record is based on the scribe's personal observations and the provider's statements to them. This document has been checked and approved by the attending provider.

## 2020-01-15 NOTE — Telephone Encounter (Signed)
Pt declined appt today in [facility] because he is seeing Dr Sondra Come tomorrow.

## 2020-01-16 ENCOUNTER — Ambulatory Visit
Admission: RE | Admit: 2020-01-16 | Discharge: 2020-01-16 | Disposition: A | Discharge status: Home / Self Care | Payer: BC Managed Care – PPO | Source: Ambulatory Visit | Attending: Radiation Oncology | Admitting: Radiation Oncology

## 2020-01-16 ENCOUNTER — Other Ambulatory Visit: Payer: Self-pay

## 2020-01-16 ENCOUNTER — Encounter: Payer: Self-pay | Admitting: Radiation Oncology

## 2020-01-16 VITALS — BP 132/92 | HR 102 | Temp 97.8°F | Resp 18 | Wt 186.0 lb

## 2020-01-16 DIAGNOSIS — Z08 Encounter for follow-up examination after completed treatment for malignant neoplasm: Secondary | ICD-10-CM | POA: Diagnosis not present

## 2020-01-16 DIAGNOSIS — Z79899 Other long term (current) drug therapy: Secondary | ICD-10-CM | POA: Insufficient documentation

## 2020-01-16 DIAGNOSIS — R519 Headache, unspecified: Secondary | ICD-10-CM | POA: Diagnosis not present

## 2020-01-16 DIAGNOSIS — R51 Headache with orthostatic component, not elsewhere classified: Secondary | ICD-10-CM | POA: Insufficient documentation

## 2020-01-16 DIAGNOSIS — J439 Emphysema, unspecified: Secondary | ICD-10-CM | POA: Diagnosis not present

## 2020-01-16 DIAGNOSIS — R21 Rash and other nonspecific skin eruption: Secondary | ICD-10-CM | POA: Diagnosis not present

## 2020-01-16 DIAGNOSIS — Z923 Personal history of irradiation: Secondary | ICD-10-CM | POA: Diagnosis not present

## 2020-01-16 DIAGNOSIS — R5383 Other fatigue: Secondary | ICD-10-CM | POA: Diagnosis not present

## 2020-01-16 DIAGNOSIS — Z7901 Long term (current) use of anticoagulants: Secondary | ICD-10-CM | POA: Insufficient documentation

## 2020-01-16 DIAGNOSIS — Z7984 Long term (current) use of oral hypoglycemic drugs: Secondary | ICD-10-CM | POA: Diagnosis not present

## 2020-01-16 DIAGNOSIS — R0602 Shortness of breath: Secondary | ICD-10-CM | POA: Diagnosis not present

## 2020-01-16 DIAGNOSIS — C778 Secondary and unspecified malignant neoplasm of lymph nodes of multiple regions: Secondary | ICD-10-CM | POA: Diagnosis not present

## 2020-01-16 DIAGNOSIS — C3491 Malignant neoplasm of unspecified part of right bronchus or lung: Secondary | ICD-10-CM

## 2020-01-16 DIAGNOSIS — C3411 Malignant neoplasm of upper lobe, right bronchus or lung: Secondary | ICD-10-CM | POA: Insufficient documentation

## 2020-01-16 DIAGNOSIS — Z9221 Personal history of antineoplastic chemotherapy: Secondary | ICD-10-CM | POA: Insufficient documentation

## 2020-01-16 MED ORDER — SILVER SULFADIAZINE 1 % EX CREA: TOPICAL_CREAM | Freq: Every day | CUTANEOUS | Status: DC

## 2020-01-16 NOTE — Progress Notes (Signed)
Pt states having pain in his neck that feels like a pinched nerve. Pt states moderate fatigue and naps to help with fatigue. Pt states having a dry cough that is related to his emphysema. Pt denies hemoptysis. Pt states feeling shortnss of breath on exertion. Pt states having moments where he is feeling breathless. Pt states that this has randomly happened within the past 2 months. Pt denies having any painful or difficult swallowing. Pt skin has redness and a slight rash. Pt states that skin has a painful itch that hurts when moving his neck. Pt states that he has had this rash for 2 months. Pt states that he has had an appointment with Dr. Lew Dawes PA and was prescribed a cream. Pt states that cream prescribed has not helped rash. Pt states that he has used sonafine and that has helped. Pt states that he is not currently on chemotherapy treatments.   BP (!) 132/92 (BP Location: Left Arm, Patient Position: Sitting)   Pulse (!) 102   Temp 97.8 F (36.6 C) (Oral)   Resp 18   Wt 186 lb (84.4 kg)   SpO2 97%   BMI 27.47 kg/m

## 2020-01-18 DIAGNOSIS — Z0001 Encounter for general adult medical examination with abnormal findings: Secondary | ICD-10-CM | POA: Diagnosis not present

## 2020-01-18 DIAGNOSIS — E119 Type 2 diabetes mellitus without complications: Secondary | ICD-10-CM | POA: Diagnosis not present

## 2020-01-18 DIAGNOSIS — E1165 Type 2 diabetes mellitus with hyperglycemia: Secondary | ICD-10-CM | POA: Diagnosis not present

## 2020-01-18 DIAGNOSIS — L309 Dermatitis, unspecified: Secondary | ICD-10-CM | POA: Diagnosis not present

## 2020-01-18 DIAGNOSIS — Z0000 Encounter for general adult medical examination without abnormal findings: Secondary | ICD-10-CM | POA: Diagnosis not present

## 2020-01-18 DIAGNOSIS — M5412 Radiculopathy, cervical region: Secondary | ICD-10-CM | POA: Diagnosis not present

## 2020-01-22 ENCOUNTER — Other Ambulatory Visit: Payer: Self-pay

## 2020-01-22 ENCOUNTER — Encounter (HOSPITAL_COMMUNITY): Payer: Self-pay

## 2020-01-22 ENCOUNTER — Inpatient Hospital Stay: Payer: BC Managed Care – PPO | Attending: Internal Medicine

## 2020-01-22 ENCOUNTER — Ambulatory Visit (HOSPITAL_COMMUNITY)
Admission: RE | Admit: 2020-01-22 | Discharge: 2020-01-22 | Disposition: A | Discharge status: Home / Self Care | Payer: BC Managed Care – PPO | Source: Ambulatory Visit | Attending: Physician Assistant | Admitting: Physician Assistant

## 2020-01-22 ENCOUNTER — Encounter: Payer: Self-pay | Admitting: Internal Medicine

## 2020-01-22 DIAGNOSIS — J181 Lobar pneumonia, unspecified organism: Secondary | ICD-10-CM | POA: Diagnosis not present

## 2020-01-22 DIAGNOSIS — Z79899 Other long term (current) drug therapy: Secondary | ICD-10-CM | POA: Insufficient documentation

## 2020-01-22 DIAGNOSIS — C770 Secondary and unspecified malignant neoplasm of lymph nodes of head, face and neck: Secondary | ICD-10-CM | POA: Diagnosis not present

## 2020-01-22 DIAGNOSIS — Z7952 Long term (current) use of systemic steroids: Secondary | ICD-10-CM | POA: Insufficient documentation

## 2020-01-22 DIAGNOSIS — E119 Type 2 diabetes mellitus without complications: Secondary | ICD-10-CM | POA: Diagnosis not present

## 2020-01-22 DIAGNOSIS — Z7901 Long term (current) use of anticoagulants: Secondary | ICD-10-CM | POA: Insufficient documentation

## 2020-01-22 DIAGNOSIS — I10 Essential (primary) hypertension: Secondary | ICD-10-CM | POA: Diagnosis not present

## 2020-01-22 DIAGNOSIS — R918 Other nonspecific abnormal finding of lung field: Secondary | ICD-10-CM | POA: Diagnosis not present

## 2020-01-22 DIAGNOSIS — J45909 Unspecified asthma, uncomplicated: Secondary | ICD-10-CM | POA: Insufficient documentation

## 2020-01-22 DIAGNOSIS — C3491 Malignant neoplasm of unspecified part of right bronchus or lung: Secondary | ICD-10-CM

## 2020-01-22 DIAGNOSIS — C3490 Malignant neoplasm of unspecified part of unspecified bronchus or lung: Secondary | ICD-10-CM | POA: Diagnosis not present

## 2020-01-22 DIAGNOSIS — C3411 Malignant neoplasm of upper lobe, right bronchus or lung: Secondary | ICD-10-CM | POA: Diagnosis not present

## 2020-01-22 DIAGNOSIS — Z7984 Long term (current) use of oral hypoglycemic drugs: Secondary | ICD-10-CM | POA: Diagnosis not present

## 2020-01-22 LAB — CBC WITH DIFFERENTIAL (CANCER CENTER ONLY)
Abs Immature Granulocytes: 0.14 10*3/uL — ABNORMAL HIGH (ref 0.00–0.07)
Basophils Absolute: 0.1 10*3/uL (ref 0.0–0.1)
Basophils Relative: 0 %
Eosinophils Absolute: 0.1 10*3/uL (ref 0.0–0.5)
Eosinophils Relative: 1 %
HCT: 44.1 % (ref 39.0–52.0)
Hemoglobin: 14.8 g/dL (ref 13.0–17.0)
Immature Granulocytes: 1 %
Lymphocytes Relative: 5 %
Lymphs Abs: 0.7 10*3/uL (ref 0.7–4.0)
MCH: 30 pg (ref 26.0–34.0)
MCHC: 33.6 g/dL (ref 30.0–36.0)
MCV: 89.5 fL (ref 80.0–100.0)
Monocytes Absolute: 1.9 10*3/uL — ABNORMAL HIGH (ref 0.1–1.0)
Monocytes Relative: 14 %
Neutro Abs: 11.1 10*3/uL — ABNORMAL HIGH (ref 1.7–7.7)
Neutrophils Relative %: 79 %
Platelet Count: 268 10*3/uL (ref 150–400)
RBC: 4.93 MIL/uL (ref 4.22–5.81)
RDW: 13.7 % (ref 11.5–15.5)
WBC Count: 14 10*3/uL — ABNORMAL HIGH (ref 4.0–10.5)
nRBC: 0 % (ref 0.0–0.2)

## 2020-01-22 LAB — CMP (CANCER CENTER ONLY)
ALT: 26 U/L (ref 0–44)
AST: 19 U/L (ref 15–41)
Albumin: 3.7 g/dL (ref 3.5–5.0)
Alkaline Phosphatase: 134 U/L — ABNORMAL HIGH (ref 38–126)
Anion gap: 11 (ref 5–15)
BUN: 19 mg/dL (ref 6–20)
CO2: 21 mmol/L — ABNORMAL LOW (ref 22–32)
Calcium: 9.4 mg/dL (ref 8.9–10.3)
Chloride: 106 mmol/L (ref 98–111)
Creatinine: 0.88 mg/dL (ref 0.61–1.24)
GFR, Est AFR Am: >60 mL/min (ref >60)
GFR, Estimated: >60 mL/min (ref >60)
Glucose, Bld: 115 mg/dL — ABNORMAL HIGH (ref 70–99)
Potassium: 4.5 mmol/L (ref 3.5–5.1)
Sodium: 138 mmol/L (ref 135–145)
Total Bilirubin: 0.3 mg/dL (ref 0.3–1.2)
Total Protein: 6.8 g/dL (ref 6.5–8.1)

## 2020-01-22 MED ORDER — IOHEXOL 300 MG/ML  SOLN
75.0000 mL | Freq: Once | INTRAMUSCULAR | Status: AC | PRN
  Administered 2020-01-22: 75 mL via INTRAVENOUS

## 2020-01-22 MED ORDER — SODIUM CHLORIDE (PF) 0.9 % IJ SOLN
INTRAMUSCULAR | Status: AC
  Filled 2020-01-22: qty 50

## 2020-01-22 NOTE — Progress Notes (Signed)
[facility] °OFFICE PROGRESS NOTE ° °Robbins, [name], MD °[address] °[place] [address] ° °DIAGNOSIS: Stage IIIB (T1c, N3, M0) non-small cell lung cancer, adenocarcinoma presented with right upper lobe lung mass in addition to mediastinal and bilateral supraclavicular lymphadenopathy diagnosed in January 2020. °  °PD-L1: 10% °  °Guardant 360 molecular studies showed no actionable mutation ° °PRIOR THERAPY: °1) Concurrent chemoradiation with weekly carboplatin for AUC of 2 and paclitaxel 45 mg/M2.  Status post 6 cycles.  Last dose was given on October 10, 2018 with stable disease. °2) Radiation treatment to the enlarging right cervical lymph nodes under the care of Dr. Kinard. First treatment 03/06/2019. Last treatment scheduled on 04/13/2019 °3) Consolidation treatment with immunotherapy with Imfinzi 10 mg/KG every 2 weeks.  First dose November 17, 2018.  Status post 26 cycles ° °CURRENT THERAPY: Observation  ° °INTERVAL HISTORY: °Brian [name] 58 y.o. male returns to the clinic for [name] follow up visit accompanied by his wife. The patient has some concerns today. First, he has had [name] rash on his left cervical/clavical area for over 2 months months. The rash had recently worsened. It is erythematous, blanchable, pruritic, and swollen. He denies any fevers, warmth, vesicles, or purulent drainage. He is non-toxic appearing. He describes associated pins and needles sensation. It has subsided somewhat from when it was at its worst [name] week or so ago but is still present and troubling to the patient. He has tried hydrocortisone cream, triamcinolone cream, and was recently given silvadene and sonafine at his recent follow up with radiation oncology. Also at his visit with radiation oncology, he was endorsing daily headaches. He is scheduled for brain MRI on 6/30. He also is noting [name] sharp shooting pain from his neck down his right arm which is concerning to him.  ° °He saw pulmonology for his shortness of  breath who recommended trelergy and albuterol. He lost about 10 lbs or so since his last appointment. Denies any nausea, vomiting, diarrhea, abdominal pain, or constipation. He denies chest pain or hemoptysis but reports increased coughing which can be dry or productive with clear/yellow mucus. He also feels more short of breath with exertion. He recently had [name] restaging CT scan of his chest and neck. He is here for evaluation and to review his scan results.  ° ° ° °MEDICAL HISTORY: °Past Medical History:  °Diagnosis Date  °• Asthma   °• Diabetes mellitus without complication (HCC)   °• Hypertension   °• nscl ca dx'd 08/2018  °• Subarachnoid hemorrhage (HCC)   ° ° °ALLERGIES:  has No Known Allergies. ° °MEDICATIONS:  °Current Outpatient Medications  °Medication Sig Dispense Refill  °• albuterol (VENTOLIN HFA) 108 (90 Base) MCG/ACT inhaler Inhale 2 puffs into the lungs every 4 (four) hours as needed for wheezing or shortness of breath. 6.7 g 5  °• amLODipine (NORVASC) 5 MG tablet Take 5 mg by mouth daily.     °• Ascorbic Acid (VITAMIN C) 100 MG tablet Take 100 mg by mouth daily.    °• atorvastatin (LIPITOR) 20 MG tablet Take 20 mg by mouth daily.     °• cephALEXin (KEFLEX) 500 MG capsule Take 1 capsule (500 mg total) by mouth 4 (four) times daily. 20 capsule 0  °• cholecalciferol (VITAMIN D3) 25 MCG (1000 UNIT) tablet Take 1,000 Units by mouth daily.    °• metFORMIN (GLUCOPHAGE) 500 MG tablet Take 500 mg by mouth daily with breakfast.     °• methylPREDNISolone (MEDROL DOSEPAK) 4 MG TBPK tablet Use as instructed 21 tablet 0  °•   Multiple Vitamin (MULTIVITAMIN) tablet Take 1 tablet by mouth daily.     predniSONE (DELTASONE) 10 MG tablet Take 1 tablet (10 mg total) by mouth daily with breakfast. Take 4 tabs daily with food or 4 days, then 3 tabs daily for 4 days, then 2 tabs daily for 4 days, then 1 tab daily for 4 days, then stop 40 tablet 0   prochlorperazine (COMPAZINE) 10 MG tablet Take 1 tablet (10 mg total) by  mouth every 6 (six) hours as needed for nausea or vomiting. 30 tablet 0   Propylene Glycol (SYSTANE BALANCE OP) Apply 1 drop to eye daily as needed (eye irritation).     Respiratory Therapy Supplies (FLUTTER) DEVI Please use up 10 times daily(4-5 breaths, 4-5 times daily) 1 each 0   rivaroxaban (XARELTO) 20 MG TABS tablet Take 1 tablet (20 mg total) by mouth daily with supper. 30 tablet 2   TRELEGY ELLIPTA 100-62.5-25 MCG/INH AEPB Inhale 1 puff into the lungs daily.     triamcinolone lotion (KENALOG) 0.1 % Apply 1 application topically 4 (four) times daily. 120 mL 2   venlafaxine (EFFEXOR) 37.5 MG tablet Take 37.5 mg by mouth daily.      No current facility-administered medications for this visit.    SURGICAL HISTORY:  Past Surgical History:  Procedure Laterality Date   LEG SURGERY  age 15    left leg, from fracture    REVIEW OF SYSTEMS:   Review of Systems  Constitutional: Positive for weight loss. Negative for appetite change, chills, fatigue, and fever HENT: Negative for mouth sores, nosebleeds, sore throat and trouble swallowing.   Eyes: Negative for eye problems and icterus.  Respiratory: Positive for cough and shortness of breath. Negative for hemoptysis and wheezing.   Cardiovascular: Negative for chest pain and leg swelling.  Gastrointestinal: Negative for abdominal pain, constipation, diarrhea, nausea and vomiting.  Genitourinary: Negative for bladder incontinence, difficulty urinating, dysuria, frequency and hematuria.   Musculoskeletal: Positive for intermittent posterior neck pain. Negative for back pain, gait problem, and neck stiffness.  Skin: Positive for erythematous rash in right cervical area.  Neurological: Positive for headaches. Negative for dizziness, extremity weakness, gait problem, light-headedness and seizures.  Hematological: Negative for adenopathy. Does not bruise/bleed easily.  Psychiatric/Behavioral: Negative for confusion, depression and sleep  disturbance. The patient is not nervous/anxious.     PHYSICAL EXAMINATION:  Blood pressure 133/90, pulse 95, temperature 97.9 F (36.6 C), temperature source Temporal, resp. rate 18, height 5' 9" (1.753 m), weight 184 lb (83.5 kg), SpO2 98 %.  ECOG PERFORMANCE STATUS: 1 - Symptomatic but completely ambulatory  Physical Exam  Constitutional: Oriented to person, place, and time and well-developed, well-nourished, and in no distress.  HENT:  Head: Normocephalic and atraumatic.  Mouth/Throat: Oropharynx is clear and moist. No oropharyngeal exudate.  Eyes: Conjunctivae are normal. Right eye exhibits no discharge. Left eye exhibits no discharge. No scleral icterus.  Neck: Normal range of motion. Neck supple.  Cardiovascular: Normal rate, regular rhythm, normal heart sounds and intact distal pulses.   Pulmonary/Chest: Effort normal and breath sounds normal. No respiratory distress. No wheezes. No rales.  Abdominal: Soft. Bowel sounds are normal. Exhibits no distension and no mass. There is no tenderness.  Musculoskeletal: Normal range of motion. Exhibits no edema.  Lymphadenopathy:    No cervical adenopathy.  Neurological: Alert and oriented to person, place, and time. Exhibits normal muscle tone. Gait normal. Coordination normal.  Skin: Erythematous, palpable, blanching rash on right cervical area.  Psychiatric:  Mood, memory and judgment normal.  °Vitals reviewed. ° °LABORATORY DATA: °Lab Results  °Component Value Date  ° WBC 14.0 (H) 01/22/2020  ° HGB 14.8 01/22/2020  ° HCT 44.1 01/22/2020  ° MCV 89.5 01/22/2020  ° PLT 268 01/22/2020  ° ° °  Chemistry   °   °Component Value Date/Time  ° NA 138 01/22/2020 0915  ° K 4.5 01/22/2020 0915  ° CL 106 01/22/2020 0915  ° CO2 21 (L) 01/22/2020 0915  ° BUN 19 01/22/2020 0915  ° CREATININE 0.88 01/22/2020 0915  °    °Component Value Date/Time  ° CALCIUM 9.4 01/22/2020 0915  ° ALKPHOS 134 (H) 01/22/2020 0915  ° AST 19 01/22/2020 0915  ° ALT 26 01/22/2020 0915   ° BILITOT 0.3 01/22/2020 0915  °  ° ° ° °RADIOGRAPHIC STUDIES: ° °CT Soft Tissue Neck W Contrast ° °Result Date: 01/22/2020 °CLINICAL DATA:  Lung cancer, history of lymph nodes in the neck EXAM: CT NECK WITH CONTRAST TECHNIQUE: Multidetector CT imaging of the neck was performed using the standard protocol following the bolus administration of intravenous contrast. CONTRAST:  75mL OMNIPAQUE IOHEXOL 300 MG/ML  SOLN COMPARISON:  11/20/2019 FINDINGS: Pharynx and larynx: Unremarkable with no mass or swelling. Salivary glands: Unremarkable. Thyroid: Normal. Lymph nodes: There are no pathologically enlarged or necrotic lymph nodes identified. Nonspecific slight increase in size of some left level 2 nodes (series 2, image 55) and left supraclavicular nodes (series 2, image [id number]). Vascular: Major neck vessels are patent. Limited intracranial: No abnormal enhancement. Visualized orbits: Unremarkable Mastoids and visualized paranasal sinuses: Aerated. Skeleton: Cervical spine degenerative changes, greatest at C5-C6. Upper chest: See separately dictated CT chest. Other: None. IMPRESSION: No pathologically enlarged lymph nodes. Nonspecific slight increase in size of some left level 2 and left supraclavicular nodes for which attention on follow-up is recommended. Electronically Signed   By: Praneil  Patel M.D.   On: 01/22/2020 20:49  ° °CT Chest W Contrast ° °Result Date: 01/22/2020 °CLINICAL DATA:  75 ml omni 300 Pt.states hx of Lung ca dx'd 2020 with mets to the lymph nodes and neck, immunotherapy complete 04/2019, xrt to the lung and neck complete, chemo complete EXAM: CT CHEST WITH CONTRAST TECHNIQUE: Multidetector CT imaging of the chest was performed during intravenous contrast administration. CONTRAST:  75mL OMNIPAQUE IOHEXOL 300 MG/ML  SOLN COMPARISON:  None. FINDINGS: Cardiovascular: No significant vascular findings. Normal heart size. No pericardial effusion. Mediastinum/Nodes: No axillary or supraclavicular adenopathy.  No mediastinal hilar adenopathy. No pericardial effusion. Mild thickening the esophagus. Lungs/Pleura: Perihilar consolidation with air bronchograms in the RIGHT lung are similar comparison exam. However, there are newly enlarged nodules within the superior segment of the RIGHT lower lobe and RIGHT upper lobe. For example 16 mm nodule on image 78/6 is increased from 8 mm. Adjacent 14 mm nodule on image 76/6 is not clearly seen. More superiorly in the RIGHT lower lobe 17 mm nodule on CT image 69 compared to 21 mm. IN the the upper lobe, 13 mm nodule on image 54/6 increased from 6 mm. No suspicious nodules in the LEFT lung. Upper Abdomen: Limited view of the liver, kidneys, pancreas are unremarkable. Normal adrenal glands. Retroperitoneal nodule/node adjacent the crus of the diaphragm and LEFT adrenal gland measures 11 mm (image 55/3 compared to 7 mm. Musculoskeletal: No aggressive osseous lesion. IMPRESSION: 1. Interval increase in size of multiple RIGHT lung pulmonary nodules highly concerning for lung cancer recurrence. Pulmonary infection is less favored. 2. Stable perihilar consolidation in the   RIGHT lung. 3. Interval increase in size of small retroperitoneal lymph node adjacent to the crus of the diaphragm and LEFT adrenal gland. Concerning for upper abdominal nodal metastasis. 4. Consider FDG PET imaging to evaluate extent of suspected lung cancer recurrence. Electronically Signed   By: Stewart  Edmunds M.D.   On: 01/22/2020 13:45  ° ° ° °ASSESSMENT/PLAN:  °This is [name] very pleasant 58-year-old Caucasian male diagnosed with stage IIIb non-small cell lung cancer, adenocarcinoma.  He presented with [name] right upper lobe lung mass in addition to mediastinal and bilateral supraclavicular lymphadenopathy.  He was diagnosed in January 2020.  His PDL 1 expression is 10% and he has no actionable mutations. °  °He underwent [name] course of concurrent chemoradiation with weekly carboplatin and paclitaxel. He is status post 6  cycles.  He tolerated well except for fatigue and odynophagia.  ° °He completed his 26 cycles of consolidation immunotherapy with Imfinzi 10 mg/kg IV every 2 weeks. He tolerated it well without any adverse side effects except has [name] very mild skin rash over his right cervical area. He completed radiation to the enlarging right cervical lymph nodes. His last radiation treatment was on 04/13/2019. ° °The patient recently had [name] restaging CT scan of the chest and neck. Dr. Mohamed personally and independently reviewed the scan and discussed the results with the patient. The scan showed Interval increase in size of multiple RIGHT lung pulmonary °nodules highly concerning for lung cancer recurrence. Interval increase in size of small retroperitoneal lymph node adjacent to the crus of the diaphragm and LEFT adrenal gland. °Concerning for upper abdominal nodal metastasis.  ° °The CT of the neck noted No pathologically enlarged lymph nodes. Nonspecific slight increase °in size of some left level 2 and left supraclavicular nodes for °which attention on follow-up is recommended. ° °Dr. Mohamed recommends that the patient have [name] PET scan to further evaluate his condition.  ° °We will see the patient back for [name] follow up visit in 10-14 days/after the PET scan to discuss the results and further recommendations for management of his condition. The patient inquired about clinical trials or second opinions. Discussed that we will discuss at his next visit once we have the results to his PET scan. We are happy to refer him at that time for [name] second opinion.  ° °He was advised to keep his MRI of the brain next week as scheduled.  ° °For his rash, I have sent [name] prescription for Keflex 500 mg QID to his pharmacy. The patient denies fevers and is non-toxic appearing, he notes an improvement in the rash compared to last week but it is still present. Dr. Mohamed also recommended [name] medrol dosepak. If not improvement, we will refer the patient  to dermatology for consideration of [name] biopsy.  ° °The patient was advised to call immediately if he has any concerning symptoms in the interval. °The patient voices understanding of current disease status and treatment options and is in agreement with the current care plan. °All questions were answered. The patient knows to call the clinic with any problems, questions or concerns. We can certainly see the patient much sooner if necessary ° ° ° °Orders Placed This Encounter  °Procedures  °• NM PET Image Restag (PS) Skull Base To Thigh  °  Standing Status:   Future  °  Standing Expiration Date:   01/23/2021  °  Order Specific Question:   ** REASON FOR EXAM (FREE TEXT)  °  Answer:   Restaging Lung cancer, suspious recurrence  °  Order Specific Question:     If indicated for the ordered procedure, I authorize the administration of [name] radiopharmaceutical per Radiology protocol  °  Answer:   Yes  °  Order Specific Question:   Preferred imaging location?  °  Answer:   [place]  °  Order Specific Question:   Release to patient  °  Answer:   Immediate  °  Order Specific Question:   Radiology Contrast Protocol - do NOT remove file path  °  Answer:   [email or web address]  °  ° °Cassandra L Heilingoetter, PA-C °01/24/20 ° °ADDENDUM: °Hematology/Oncology Attending: °I had [name] face-to-face encounter with the patient today.  I recommended his care plan.  This is [name] very pleasant 58 years old white male with history of stage IIIb non-small cell lung cancer, adenocarcinoma diagnosed in January 2020 with PD-L1 expression of 10% and no actionable mutation on Guardant blood test status post [name] course of concurrent chemoradiation.  This was followed by consolidation treatment with immunotherapy with Imfinzi for 1 year. He also received palliative radiotherapy to progressive right supraclavicular and cervical lymphadenopathy. °The patient is currently on observation since that time. °He continues to have worsening skin  rash in the same area of the radiotherapy.\ °The patient had repeat CT scan of the neck and chest performed recently.  I personally and independently reviewed the scan images and discussed the results with the patient and his wife. °His scan showed no concerning disease progression in the neck but the patient has multiple new and enlarging pulmonary nodules suspicious for disease progression. °I recommended for the patient to have [name] PET scan performed for further evaluation of this lesion.  Depending on the scan results I will discuss with him future treatment options. °I may also consider repeating his molecular studies if we are able to get another tissue biopsy from [name] progressive lesion. °For the skin rash on the right neck area, we will start the patient on treatment with Medrol Dosepak as well as Keflex for the possibility of cellulitis. °The patient will come back for follow-up visit in around 2 weeks for evaluation and discussion of his treatment options based on the scan results. °He was advised to call immediately if he has any concerning symptoms in the interval. ° °Disclaimer: This note was dictated with voice recognition software. Similar sounding words can inadvertently be transcribed and may be missed upon review. °Mohamed K Mohamed, MD °01/24/20 ° °

## 2020-01-24 ENCOUNTER — Inpatient Hospital Stay (HOSPITAL_BASED_OUTPATIENT_CLINIC_OR_DEPARTMENT_OTHER): Payer: BC Managed Care – PPO | Admitting: Physician Assistant

## 2020-01-24 ENCOUNTER — Encounter: Payer: Self-pay | Admitting: Physician Assistant

## 2020-01-24 ENCOUNTER — Other Ambulatory Visit: Payer: Self-pay

## 2020-01-24 VITALS — BP 133/90 | HR 95 | Temp 97.9°F | Resp 18 | Ht 69.0 in | Wt 184.0 lb

## 2020-01-24 DIAGNOSIS — R21 Rash and other nonspecific skin eruption: Secondary | ICD-10-CM

## 2020-01-24 DIAGNOSIS — C770 Secondary and unspecified malignant neoplasm of lymph nodes of head, face and neck: Secondary | ICD-10-CM | POA: Diagnosis not present

## 2020-01-24 DIAGNOSIS — Z7984 Long term (current) use of oral hypoglycemic drugs: Secondary | ICD-10-CM | POA: Diagnosis not present

## 2020-01-24 DIAGNOSIS — E119 Type 2 diabetes mellitus without complications: Secondary | ICD-10-CM | POA: Diagnosis not present

## 2020-01-24 DIAGNOSIS — C3411 Malignant neoplasm of upper lobe, right bronchus or lung: Secondary | ICD-10-CM | POA: Diagnosis not present

## 2020-01-24 DIAGNOSIS — Z79899 Other long term (current) drug therapy: Secondary | ICD-10-CM | POA: Diagnosis not present

## 2020-01-24 DIAGNOSIS — Z7189 Other specified counseling: Secondary | ICD-10-CM | POA: Diagnosis not present

## 2020-01-24 DIAGNOSIS — C3491 Malignant neoplasm of unspecified part of right bronchus or lung: Secondary | ICD-10-CM

## 2020-01-24 DIAGNOSIS — Z7952 Long term (current) use of systemic steroids: Secondary | ICD-10-CM | POA: Diagnosis not present

## 2020-01-24 DIAGNOSIS — J45909 Unspecified asthma, uncomplicated: Secondary | ICD-10-CM | POA: Diagnosis not present

## 2020-01-24 DIAGNOSIS — Z7901 Long term (current) use of anticoagulants: Secondary | ICD-10-CM | POA: Diagnosis not present

## 2020-01-24 DIAGNOSIS — I10 Essential (primary) hypertension: Secondary | ICD-10-CM | POA: Diagnosis not present

## 2020-01-24 MED ORDER — METHYLPREDNISOLONE 4 MG PO TBPK: ORAL_TABLET | ORAL | 0 refills | Status: DC

## 2020-01-24 MED ORDER — CEPHALEXIN 500 MG PO CAPS: 500.0000 mg | ORAL_CAPSULE | Freq: Four times a day (QID) | ORAL | 0 refills | Status: DC

## 2020-01-30 ENCOUNTER — Encounter (HOSPITAL_COMMUNITY): Payer: Self-pay

## 2020-01-30 ENCOUNTER — Emergency Department (HOSPITAL_COMMUNITY)
Admission: EM | Admit: 2020-01-30 | Discharge: 2020-01-30 | Discharge status: Against Medical Advice | Payer: BC Managed Care – PPO | Attending: Emergency Medicine | Admitting: Emergency Medicine

## 2020-01-30 ENCOUNTER — Other Ambulatory Visit: Payer: Self-pay

## 2020-01-30 DIAGNOSIS — Z5321 Procedure and treatment not carried out due to patient leaving prior to being seen by health care provider: Secondary | ICD-10-CM | POA: Insufficient documentation

## 2020-01-30 DIAGNOSIS — R0602 Shortness of breath: Secondary | ICD-10-CM | POA: Diagnosis not present

## 2020-01-30 NOTE — ED Notes (Signed)
Pt states he is leaving
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
## 2020-01-31 ENCOUNTER — Ambulatory Visit (HOSPITAL_COMMUNITY): Payer: BC Managed Care – PPO

## 2020-02-01 DIAGNOSIS — J189 Pneumonia, unspecified organism: Secondary | ICD-10-CM | POA: Diagnosis not present

## 2020-02-01 DIAGNOSIS — Z87891 Personal history of nicotine dependence: Secondary | ICD-10-CM | POA: Diagnosis not present

## 2020-02-01 DIAGNOSIS — J449 Chronic obstructive pulmonary disease, unspecified: Secondary | ICD-10-CM | POA: Diagnosis not present

## 2020-02-01 DIAGNOSIS — C3491 Malignant neoplasm of unspecified part of right bronchus or lung: Secondary | ICD-10-CM | POA: Diagnosis not present

## 2020-02-02 ENCOUNTER — Other Ambulatory Visit: Payer: Self-pay

## 2020-02-02 ENCOUNTER — Encounter (HOSPITAL_COMMUNITY)
Admission: RE | Admit: 2020-02-02 | Discharge: 2020-02-02 | Disposition: A | Discharge status: Home / Self Care | Payer: BC Managed Care – PPO | Source: Ambulatory Visit | Attending: Physician Assistant | Admitting: Physician Assistant

## 2020-02-02 DIAGNOSIS — R590 Localized enlarged lymph nodes: Secondary | ICD-10-CM | POA: Diagnosis not present

## 2020-02-02 DIAGNOSIS — C3491 Malignant neoplasm of unspecified part of right bronchus or lung: Secondary | ICD-10-CM | POA: Diagnosis not present

## 2020-02-02 DIAGNOSIS — C3490 Malignant neoplasm of unspecified part of unspecified bronchus or lung: Secondary | ICD-10-CM | POA: Diagnosis not present

## 2020-02-02 DIAGNOSIS — R918 Other nonspecific abnormal finding of lung field: Secondary | ICD-10-CM | POA: Diagnosis not present

## 2020-02-02 DIAGNOSIS — R911 Solitary pulmonary nodule: Secondary | ICD-10-CM | POA: Diagnosis not present

## 2020-02-02 LAB — GLUCOSE, CAPILLARY: Glucose-Capillary: 104 mg/dL — ABNORMAL HIGH (ref 70–99)

## 2020-02-02 MED ORDER — FLUDEOXYGLUCOSE F - 18 (FDG) INJECTION
8.8300 | Freq: Once | INTRAVENOUS | Status: AC | PRN
  Administered 2020-02-02: 8.83 via INTRAVENOUS

## 2020-02-04 NOTE — Progress Notes (Signed)
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 and may be missed upon review. Eilleen Kempf, MD 02/07/20

## 2020-02-06 ENCOUNTER — Encounter: Payer: Self-pay | Admitting: Internal Medicine

## 2020-02-07 ENCOUNTER — Inpatient Hospital Stay: Payer: BC Managed Care – PPO | Attending: Internal Medicine | Admitting: Physician Assistant

## 2020-02-07 ENCOUNTER — Other Ambulatory Visit: Payer: Self-pay

## 2020-02-07 ENCOUNTER — Encounter: Payer: Self-pay | Admitting: Physician Assistant

## 2020-02-07 ENCOUNTER — Other Ambulatory Visit: Payer: Self-pay | Admitting: Internal Medicine

## 2020-02-07 VITALS — BP 134/89 | HR 115 | Temp 97.6°F | Resp 18 | Ht 69.0 in | Wt 179.0 lb

## 2020-02-07 DIAGNOSIS — C3491 Malignant neoplasm of unspecified part of right bronchus or lung: Secondary | ICD-10-CM

## 2020-02-07 DIAGNOSIS — R21 Rash and other nonspecific skin eruption: Secondary | ICD-10-CM | POA: Diagnosis not present

## 2020-02-07 DIAGNOSIS — R1310 Dysphagia, unspecified: Secondary | ICD-10-CM | POA: Insufficient documentation

## 2020-02-07 DIAGNOSIS — Z923 Personal history of irradiation: Secondary | ICD-10-CM | POA: Insufficient documentation

## 2020-02-07 DIAGNOSIS — C770 Secondary and unspecified malignant neoplasm of lymph nodes of head, face and neck: Secondary | ICD-10-CM | POA: Diagnosis not present

## 2020-02-07 DIAGNOSIS — E119 Type 2 diabetes mellitus without complications: Secondary | ICD-10-CM | POA: Insufficient documentation

## 2020-02-07 DIAGNOSIS — Z5111 Encounter for antineoplastic chemotherapy: Secondary | ICD-10-CM | POA: Insufficient documentation

## 2020-02-07 DIAGNOSIS — R053 Chronic cough: Secondary | ICD-10-CM

## 2020-02-07 DIAGNOSIS — Z86711 Personal history of pulmonary embolism: Secondary | ICD-10-CM | POA: Diagnosis not present

## 2020-02-07 DIAGNOSIS — J45909 Unspecified asthma, uncomplicated: Secondary | ICD-10-CM | POA: Diagnosis not present

## 2020-02-07 DIAGNOSIS — R05 Cough: Secondary | ICD-10-CM | POA: Diagnosis not present

## 2020-02-07 DIAGNOSIS — C3411 Malignant neoplasm of upper lobe, right bronchus or lung: Secondary | ICD-10-CM | POA: Diagnosis not present

## 2020-02-07 DIAGNOSIS — Z7952 Long term (current) use of systemic steroids: Secondary | ICD-10-CM | POA: Insufficient documentation

## 2020-02-07 DIAGNOSIS — I10 Essential (primary) hypertension: Secondary | ICD-10-CM | POA: Diagnosis not present

## 2020-02-07 DIAGNOSIS — Z7901 Long term (current) use of anticoagulants: Secondary | ICD-10-CM | POA: Diagnosis not present

## 2020-02-07 DIAGNOSIS — Z5112 Encounter for antineoplastic immunotherapy: Secondary | ICD-10-CM | POA: Insufficient documentation

## 2020-02-07 DIAGNOSIS — Z79899 Other long term (current) drug therapy: Secondary | ICD-10-CM | POA: Insufficient documentation

## 2020-02-07 DIAGNOSIS — Z7189 Other specified counseling: Secondary | ICD-10-CM | POA: Diagnosis not present

## 2020-02-07 DIAGNOSIS — Z7984 Long term (current) use of oral hypoglycemic drugs: Secondary | ICD-10-CM | POA: Diagnosis not present

## 2020-02-07 MED ORDER — CYANOCOBALAMIN 1000 MCG/ML IJ SOLN
INTRAMUSCULAR | Status: AC
  Filled 2020-02-07: qty 1

## 2020-02-07 MED ORDER — CYANOCOBALAMIN 1000 MCG/ML IJ SOLN
1000.0000 ug | Freq: Once | INTRAMUSCULAR | Status: AC
  Administered 2020-02-07: 1000 ug via INTRAMUSCULAR

## 2020-02-07 MED ORDER — FOLIC ACID 1 MG PO TABS: 1.0000 mg | ORAL_TABLET | Freq: Every day | ORAL | 2 refills | Status: DC

## 2020-02-07 MED ORDER — BENZONATATE 100 MG PO CAPS: 100.0000 mg | ORAL_CAPSULE | Freq: Three times a day (TID) | ORAL | 0 refills | Status: DC | PRN

## 2020-02-07 MED ORDER — PROCHLORPERAZINE MALEATE 10 MG PO TABS: 10.0000 mg | ORAL_TABLET | Freq: Four times a day (QID) | ORAL | 2 refills | Status: DC | PRN

## 2020-02-07 NOTE — Progress Notes (Signed)
DISCONTINUE ON PATHWAY REGIMEN - Non-Small Cell Lung     Administer weekly:     Paclitaxel      Carboplatin   **Always confirm dose/schedule in your pharmacy ordering system**  REASON: Disease Progression PRIOR TREATMENT: [id number]: Carboplatin AUC=2 + Paclitaxel 45 mg/m2 Weekly During Radiation TREATMENT RESPONSE: Progressive Disease (PD)  START ON PATHWAY REGIMEN - Non-Small Cell Lung     A cycle is every 21 days:     Pembrolizumab      Pemetrexed      Carboplatin   **Always confirm dose/schedule in your pharmacy ordering system**  Patient Characteristics: Stage IV Metastatic, Nonsquamous, Initial Chemotherapy/Immunotherapy, PS = 0, 1, ALK Rearrangement Negative and ROS1 Rearrangement Negative and NTRK Gene Fusion?Negative and RET Gene Fusion?Negative and EGFR Mutation Negative/Non?Sensitizing, PD-L1  Expression Positive 1-49% (TPS) / Negative / Not Tested / Awaiting Test Results and Immunotherapy Candidate Therapeutic Status: Stage IV Metastatic Histology: Nonsquamous Cell ROS1 Rearrangement Status: Negative Other Mutations/Biomarkers: No Other Actionable Mutations NTRK Gene Fusion Status: Negative PD-L1 Expression Status: PD-L1 Positive 1-49% (TPS) Chemotherapy/Immunotherapy LOT: Initial Chemotherapy/Immunotherapy Molecular Targeted Therapy: Not Appropriate MET Exon 14 Mutation Status: Negative RET Gene Fusion Status: Negative ALK Rearrangement Status: Negative EGFR Mutation Status: Negative/Wild Type BRAF V600E Mutation Status: Negative ECOG Performance Status: 1 Biomarker Assessment Status Confirmation: All Genomic Markers Negative or Only MET+ or BRAF+ Immunotherapy Candidate Status: Candidate for Immunotherapy Intent of Therapy: Non-Curative / Palliative Intent, Discussed with Patient

## 2020-02-07 NOTE — Patient Instructions (Addendum)
-  Radiology scheduling [phone] Summary:  -There are two main categories of lung cancer, they are named based on the size of the cancer cell. One is called Non-Small cell lung cancer. The other type is Small Cell Lung Cancer -The sample (biopsy) that they took of your tumor was consistent with a subtype of Non-small cell lung cancer called Adenocarcinoma. This is the most common type of lung cancer.  -We covered a lot of important information at your appointment today regarding what the treatment plan is moving forward. Here are the the main points that were discussed at your office visit with [place] today:  -The treatment that you will receive consists of two chemotherapy drugs, called Carboplatin and Alimta (also called Pemetrexed) and one immunotherapy drug called Keytruda (pembrolizumab).  -We are planning on starting your treatment next week on 02/14/20. -Your treatment will be given once every 3 weeks. We will check your labs once a week for the first ~5 treatments just to make sure that important components of your blood are in an acceptable range -We will get a CT scan after 3 treatments to check on the progress of treatment  Medications:  -I have sent a few important medication prescriptions to your pharmacy.  -Compazine was sent to your pharmacy. This medication is for nausea. You may take this every 6 hours as needed if you feel nauseous.  -I have also sent a prescription for 1 mg of folic acid to your pharmacy. We need you to take 1 tablet every day.  -We will administer vitamin B12 every 9 weeks while you are here in the clinic. You have received your first dose today.  -I have sent in tessalon for your cough if needed.   Referrals or Imaging: -Please reschedule your MRI [id number] -Referral to Dr. Emeterio Reeve at [facility] for a second opinion -Referral to dermatology   Follow up:  -We will see you back for a follow up visit 2 week after your first treatment to see how it went and  help manage any side effects of treatment that you may have   -If you need to reach [place] at any time, the main office number to the cancer center is [phone], when you call, ask to speak to either Cassie's or Dr. Worthy Flank nurse.

## 2020-02-08 ENCOUNTER — Encounter (HOSPITAL_COMMUNITY): Payer: Self-pay | Admitting: Radiology

## 2020-02-08 NOTE — Progress Notes (Signed)
Brian [name] Male, 59 y.o., 06-12-1961 MRN:  [id number] Phone:  [phone] [name]) PCP:  [name], MD Coverage:  Sherre Poot [employer] Ppo Next Appt With Radiology ([facility]-MR 1) 02/21/2020 at 12:00 PM  RE: [place] Core Biopsy (Lymph Node) Received: Today Markus Daft, MD  Laurine Blazer, MD We are scheduling with [place].    Thank you,  Adam       Previous Messages   ----- Message -----  From: Garth Bigness D  Sent: 02/08/2020  8:41 AM EDT  To: Arne Cleveland, MD, Markus Daft, MD  Subject: FW: [place] Core Biopsy (Lymph Node)           Just received this last message from [place]. How should I proceed? Are we scheduling patient or still holding on for now? Please advise, thanks.    Donn Pierini D     Previous Messages     ----- Message -----  From: Markus Daft, MD  Sent: 02/08/2020  8:07 AM EDT  To: Lenore Cordia, Ir Procedure Requests   Patient discussed at [facility]. Schedule for US guided neck lymph node biopsy. Patient has known cancer and needs just molecular testing. Best target is from PET se 605, im 46, left neck (behind SCM). I guess the right side of neck was radiated so not as good target. Right axillary nodes may be false positive due to COVID vaccine. Try to do cores and tell pathology that sample is primarily for Molecular testing. If cannot core due to location...Marland KitchenMarland Kitchentake a ton of FNAs and put primarily in cell block solution rather than making slides.   Thanks,  Adam  ----- Message -----  From: Arne Cleveland, MD  Sent: 02/07/2020  4:30 PM EDT  To: Jillyn Hidden, *  Subject: FW: [place] Core Biopsy (Lymph Node)          As stated in the PET-CT report, these LNs are VERY SMALL so we may not be able to get more than just an FNA, no promises or guarantees about adequacy for advanced studies.  Would you rather pursue LN surgical resection?  Let me know how you want to  proceed.   DDH    ----- Message -----  From: Garth Bigness D  Sent: 02/07/2020  3:52 PM EDT  To: Ir Procedure Requests  Subject: [place] Core Biopsy (Lymph Node)            Procedure:  [place] Core Biopsy ( lymph node)   Reason: Adenocarcinoma of right lung, stage 3, Please rebiopsy the left cervical lymph node. Has hx of lung cancer, adenocarcinoma. Please ensure enough sample to send for molecular studies   History: CT, NM PET in computer   Provider: Gracelyn Nurse   Provider Contact: [phone]

## 2020-02-08 NOTE — Progress Notes (Signed)
Brian [name] Male, 59 y.o., 08-Mar-1961 MRN:  [id number] Phone:  [phone] [name]) PCP:  [name], MD Coverage:  Sherre Poot [employer] Ppo Next Appt With Radiology ([facility] 1) 02/21/2020 at 12:00 PM  RE: US Biopsy Received: Today Heilingoetter, Cassandra L, PA-C  Curt Bears, MD Cc: Garth Bigness D Yes, thank you.       Previous Messages   ----- Message -----  From: Garth Bigness D  Sent: 02/08/2020  9:30 AM EDT  To: Curt Bears, MD, *  Subject: US Biopsy                     Patient is on Xarelto, need to know if it is okay to hold for 1 day prior to biopsy.  Thanks [name]

## 2020-02-11 ENCOUNTER — Encounter: Payer: Self-pay | Admitting: Internal Medicine

## 2020-02-13 NOTE — Progress Notes (Signed)
Pharmacist Chemotherapy Monitoring - Initial Assessment    Anticipated start date: 02/14/20   Regimen:  . Are orders appropriate based on the patient's diagnosis, regimen, and cycle? Yes . Does the plan date match the patient's scheduled date? Yes . Is the sequencing of drugs appropriate? Yes . Are the premedications appropriate for the patient's regimen? Yes . Prior Authorization for treatment is: Approved o If applicable, is the correct biosimilar selected based on the patient's insurance? not applicable  Organ Function and Labs: Marland Kitchen Are dose adjustments needed based on the patient's renal function, hepatic function, or hematologic function? No . Are appropriate labs ordered prior to the start of patient's treatment? Yes . Other organ system assessment, if indicated: N/A . The following baseline labs, if indicated, have been ordered: pembrolizumab: baseline TSH +/- T4  Dose Assessment: . Are the drug doses appropriate? Yes . Are the following correct: o Drug concentrations Yes o IV fluid compatible with drug Yes o Administration routes Yes o Timing of therapy Yes . If applicable, does the patient have documented access for treatment and/or plans for port-a-cath placement? not applicable . If applicable, have lifetime cumulative doses been properly documented and assessed? Yes - Carboplatin premeds added to C4 for cumulative carboplatin exposure  Lifetime Dose Tracking  . Carboplatin: 1,690 mg = 0.01 % of the maximum lifetime dose of 999,999,999 mg  o   Toxicity Monitoring/Prevention: . The patient has the following take home antiemetics prescribed: Prochlorperazine . The patient has the following take home medications prescribed: B12 for pemetrexed and folic acid for pemetrexed . Medication allergies and previous infusion related reactions, if applicable, have been reviewed and addressed. Yes . The patient's current medication list has been assessed for drug-drug interactions with  their chemotherapy regimen. no significant drug-drug interactions were identified on review.  Order Review: . Are the treatment plan orders signed? Yes . Is the patient scheduled to see a provider prior to their treatment? No  I verify that I have reviewed each item in the above checklist and answered each question accordingly.  [facility] 02/13/2020 11:09 AM

## 2020-02-14 ENCOUNTER — Inpatient Hospital Stay (HOSPITAL_BASED_OUTPATIENT_CLINIC_OR_DEPARTMENT_OTHER): Payer: BC Managed Care – PPO | Admitting: Internal Medicine

## 2020-02-14 ENCOUNTER — Other Ambulatory Visit: Payer: Self-pay

## 2020-02-14 ENCOUNTER — Other Ambulatory Visit: Payer: Self-pay | Admitting: Physician Assistant

## 2020-02-14 ENCOUNTER — Other Ambulatory Visit: Payer: Self-pay | Admitting: Radiology

## 2020-02-14 ENCOUNTER — Inpatient Hospital Stay: Payer: BC Managed Care – PPO

## 2020-02-14 ENCOUNTER — Telehealth: Payer: Self-pay | Admitting: Physician Assistant

## 2020-02-14 ENCOUNTER — Encounter: Payer: Self-pay | Admitting: Internal Medicine

## 2020-02-14 VITALS — BP 135/91 | HR 107 | Temp 98.5°F | Resp 16 | Ht 68.0 in | Wt 177.0 lb

## 2020-02-14 DIAGNOSIS — E119 Type 2 diabetes mellitus without complications: Secondary | ICD-10-CM | POA: Diagnosis not present

## 2020-02-14 DIAGNOSIS — Z79899 Other long term (current) drug therapy: Secondary | ICD-10-CM | POA: Diagnosis not present

## 2020-02-14 DIAGNOSIS — Z86711 Personal history of pulmonary embolism: Secondary | ICD-10-CM | POA: Diagnosis not present

## 2020-02-14 DIAGNOSIS — I10 Essential (primary) hypertension: Secondary | ICD-10-CM | POA: Diagnosis not present

## 2020-02-14 DIAGNOSIS — J45909 Unspecified asthma, uncomplicated: Secondary | ICD-10-CM | POA: Diagnosis not present

## 2020-02-14 DIAGNOSIS — Z923 Personal history of irradiation: Secondary | ICD-10-CM | POA: Diagnosis not present

## 2020-02-14 DIAGNOSIS — R1310 Dysphagia, unspecified: Secondary | ICD-10-CM | POA: Diagnosis not present

## 2020-02-14 DIAGNOSIS — Z7952 Long term (current) use of systemic steroids: Secondary | ICD-10-CM | POA: Diagnosis not present

## 2020-02-14 DIAGNOSIS — Z7901 Long term (current) use of anticoagulants: Secondary | ICD-10-CM | POA: Diagnosis not present

## 2020-02-14 DIAGNOSIS — Z5112 Encounter for antineoplastic immunotherapy: Secondary | ICD-10-CM | POA: Diagnosis not present

## 2020-02-14 DIAGNOSIS — Z7984 Long term (current) use of oral hypoglycemic drugs: Secondary | ICD-10-CM | POA: Diagnosis not present

## 2020-02-14 DIAGNOSIS — E039 Hypothyroidism, unspecified: Secondary | ICD-10-CM

## 2020-02-14 DIAGNOSIS — Z5111 Encounter for antineoplastic chemotherapy: Secondary | ICD-10-CM | POA: Diagnosis not present

## 2020-02-14 DIAGNOSIS — C3491 Malignant neoplasm of unspecified part of right bronchus or lung: Secondary | ICD-10-CM

## 2020-02-14 DIAGNOSIS — C3411 Malignant neoplasm of upper lobe, right bronchus or lung: Secondary | ICD-10-CM | POA: Diagnosis not present

## 2020-02-14 DIAGNOSIS — C770 Secondary and unspecified malignant neoplasm of lymph nodes of head, face and neck: Secondary | ICD-10-CM | POA: Diagnosis not present

## 2020-02-14 LAB — CMP (CANCER CENTER ONLY)
ALT: 40 U/L (ref 0–44)
AST: 26 U/L (ref 15–41)
Albumin: 3 g/dL — ABNORMAL LOW (ref 3.5–5.0)
Alkaline Phosphatase: 103 U/L (ref 38–126)
Anion gap: 10 (ref 5–15)
BUN: 17 mg/dL (ref 6–20)
CO2: 21 mmol/L — ABNORMAL LOW (ref 22–32)
Calcium: 8.5 mg/dL — ABNORMAL LOW (ref 8.9–10.3)
Chloride: 106 mmol/L (ref 98–111)
Creatinine: 0.78 mg/dL (ref 0.61–1.24)
GFR, Est AFR Am: >60 mL/min (ref >60)
GFR, Estimated: >60 mL/min (ref >60)
Glucose, Bld: 99 mg/dL (ref 70–99)
Potassium: 4.1 mmol/L (ref 3.5–5.1)
Sodium: 137 mmol/L (ref 135–145)
Total Bilirubin: 0.5 mg/dL (ref 0.3–1.2)
Total Protein: 6 g/dL — ABNORMAL LOW (ref 6.5–8.1)

## 2020-02-14 LAB — CBC WITH DIFFERENTIAL (CANCER CENTER ONLY)
Abs Immature Granulocytes: 0.68 10*3/uL — ABNORMAL HIGH (ref 0.00–0.07)
Basophils Absolute: 0.1 10*3/uL (ref 0.0–0.1)
Basophils Relative: 1 %
Eosinophils Absolute: 0.4 10*3/uL (ref 0.0–0.5)
Eosinophils Relative: 2 %
HCT: 44.9 % (ref 39.0–52.0)
Hemoglobin: 15 g/dL (ref 13.0–17.0)
Immature Granulocytes: 4 %
Lymphocytes Relative: 2 %
Lymphs Abs: 0.4 10*3/uL — ABNORMAL LOW (ref 0.7–4.0)
MCH: 29.3 pg (ref 26.0–34.0)
MCHC: 33.4 g/dL (ref 30.0–36.0)
MCV: 87.7 fL (ref 80.0–100.0)
Monocytes Absolute: 1.5 10*3/uL — ABNORMAL HIGH (ref 0.1–1.0)
Monocytes Relative: 9 %
Neutro Abs: 14.6 10*3/uL — ABNORMAL HIGH (ref 1.7–7.7)
Neutrophils Relative %: 82 %
Platelet Count: 145 10*3/uL — ABNORMAL LOW (ref 150–400)
RBC: 5.12 MIL/uL (ref 4.22–5.81)
RDW: 14.1 % (ref 11.5–15.5)
WBC Count: 17.7 10*3/uL — ABNORMAL HIGH (ref 4.0–10.5)
nRBC: 0 % (ref 0.0–0.2)

## 2020-02-14 LAB — TSH: TSH: 16.593 u[IU]/mL — ABNORMAL HIGH (ref 0.320–4.118)

## 2020-02-14 MED ORDER — SODIUM CHLORIDE 0.9 % IV SOLN
663.5000 mg | Freq: Once | INTRAVENOUS | Status: AC
  Administered 2020-02-14: 660 mg via INTRAVENOUS
  Filled 2020-02-14: qty 66

## 2020-02-14 MED ORDER — SODIUM CHLORIDE 0.9 % IV SOLN
200.0000 mg | Freq: Once | INTRAVENOUS | Status: AC
  Administered 2020-02-14: 200 mg via INTRAVENOUS
  Filled 2020-02-14: qty 8

## 2020-02-14 MED ORDER — LEVOTHYROXINE SODIUM 50 MCG PO TABS: 50.0000 ug | ORAL_TABLET | Freq: Every day | ORAL | 2 refills | Status: DC

## 2020-02-14 MED ORDER — SODIUM CHLORIDE 0.9 % IV SOLN
10.0000 mg | Freq: Once | INTRAVENOUS | Status: AC
  Administered 2020-02-14: 10 mg via INTRAVENOUS
  Filled 2020-02-14: qty 10

## 2020-02-14 MED ORDER — SODIUM CHLORIDE 0.9 % IV SOLN
500.0000 mg/m2 | Freq: Once | INTRAVENOUS | Status: AC
  Administered 2020-02-14: 1000 mg via INTRAVENOUS
  Filled 2020-02-14: qty 40

## 2020-02-14 MED ORDER — PALONOSETRON HCL INJECTION 0.25 MG/5ML
INTRAVENOUS | Status: AC
  Filled 2020-02-14: qty 5

## 2020-02-14 MED ORDER — SODIUM CHLORIDE 0.9 % IV SOLN
150.0000 mg | Freq: Once | INTRAVENOUS | Status: AC
  Administered 2020-02-14: 150 mg via INTRAVENOUS
  Filled 2020-02-14: qty 150

## 2020-02-14 MED ORDER — SODIUM CHLORIDE 0.9 % IV SOLN
Freq: Once | INTRAVENOUS | Status: AC
  Filled 2020-02-14: qty 250

## 2020-02-14 MED ORDER — SUCRALFATE 1 GM/10ML PO SUSP: 1.0000 g | Freq: Three times a day (TID) | ORAL | 0 refills | Status: DC

## 2020-02-14 MED ORDER — PALONOSETRON HCL INJECTION 0.25 MG/5ML
0.2500 mg | Freq: Once | INTRAVENOUS | Status: AC
  Administered 2020-02-14: 0.25 mg via INTRAVENOUS

## 2020-02-14 NOTE — Telephone Encounter (Signed)
R/s appt per 7/14 sch msg - unable to reach pt. Left message with app date and time

## 2020-02-14 NOTE — Patient Instructions (Signed)
[name] Discharge Instructions for Patients Receiving Chemotherapy  Today you received the following chemotherapy agents: Pembrolizumab, Pemetrexed, and Carboplatin  To help prevent nausea and vomiting after your treatment, we encourage you to take your nausea medication as prescribed.    If you develop nausea and vomiting that is not controlled by your nausea medication, call the clinic.   BELOW ARE SYMPTOMS THAT SHOULD BE REPORTED IMMEDIATELY:  *FEVER GREATER THAN 100.5 F  *CHILLS WITH OR WITHOUT FEVER  NAUSEA AND VOMITING THAT IS NOT CONTROLLED WITH YOUR NAUSEA MEDICATION  *UNUSUAL SHORTNESS OF BREATH  *UNUSUAL BRUISING OR BLEEDING  TENDERNESS IN MOUTH AND THROAT WITH OR WITHOUT PRESENCE OF ULCERS  *URINARY PROBLEMS  *BOWEL PROBLEMS  UNUSUAL RASH Items with * indicate a potential emergency and should be followed up as soon as possible.  Feel free to call the clinic should you have any questions or concerns. The clinic phone number is [phone].  Please show the [place] at check-in to the Emergency Department and triage nurse.   Pembrolizumab injection What is this medicine? PEMBROLIZUMAB (pem broe liz ue mab) is a monoclonal antibody. It is used to treat certain types of cancer. This medicine may be used for other purposes; ask your health care provider or pharmacist if you have questions. COMMON BRAND NAME(S): Keytruda What should I tell my health care provider before I take this medicine? They need to know if you have any of these conditions:  diabetes  immune system problems  inflammatory bowel disease  liver disease  lung or breathing disease  lupus  received or scheduled to receive an organ transplant or a stem-cell transplant that uses donor stem cells  an unusual or allergic reaction to pembrolizumab, other medicines, foods, dyes, or preservatives  pregnant or trying to get pregnant  breast-feeding How should I  use this medicine? This medicine is for infusion into a vein. It is given by a health care professional in a hospital or clinic setting. A special MedGuide will be given to you before each treatment. Be sure to read this information carefully each time. Talk to your pediatrician regarding the use of this medicine in children. While this drug may be prescribed for children as young as 6 months for selected conditions, precautions do apply. Overdosage: If you think you have taken too much of this medicine contact a poison control center or emergency room at once. NOTE: This medicine is only for you. Do not share this medicine with others. What if I miss a dose? It is important not to miss your dose. Call your doctor or health care professional if you are unable to keep an appointment. What may interact with this medicine? Interactions have not been studied. Give your health care provider a list of all the medicines, herbs, non-prescription drugs, or dietary supplements you use. Also tell them if you smoke, drink alcohol, or use illegal drugs. Some items may interact with your medicine. This list may not describe all possible interactions. Give your health care provider a list of all the medicines, herbs, non-prescription drugs, or dietary supplements you use. Also tell them if you smoke, drink alcohol, or use illegal drugs. Some items may interact with your medicine. What should I watch for while using this medicine? Your condition will be monitored carefully while you are receiving this medicine. You may need blood work done while you are taking this medicine. Do not become pregnant while taking this medicine or for 4 months  after stopping it. Women should inform their doctor if they wish to become pregnant or think they might be pregnant. There is a potential for serious side effects to an unborn child. Talk to your health care professional or pharmacist for more information. Do not breast-feed an  infant while taking this medicine or for 4 months after the last dose. What side effects may I notice from receiving this medicine? Side effects that you should report to your doctor or health care professional as soon as possible:  allergic reactions like skin rash, itching or hives, swelling of the face, lips, or tongue  bloody or black, tarry  breathing problems  changes in vision  chest pain  chills  confusion  constipation  cough  diarrhea  dizziness or feeling faint or lightheaded  fast or irregular heartbeat  fever  flushing  joint pain  low blood counts - this medicine may decrease the number of white blood cells, red blood cells and platelets. You may be at increased risk for infections and bleeding.  muscle pain  muscle weakness  pain, tingling, numbness in the hands or feet  persistent headache  redness, blistering, peeling or loosening of the skin, including inside the mouth  signs and symptoms of high blood sugar such as dizziness; dry mouth; dry skin; fruity breath; nausea; stomach pain; increased hunger or thirst; increased urination  signs and symptoms of kidney injury like trouble passing urine or change in the amount of urine  signs and symptoms of liver injury like dark urine, light-colored stools, loss of appetite, nausea, right upper belly pain, yellowing of the eyes or skin  sweating  swollen lymph nodes  weight loss Side effects that usually do not require medical attention (report to your doctor or health care professional if they continue or are bothersome):  decreased appetite  hair loss  muscle pain  tiredness This list may not describe all possible side effects. Call your doctor for medical advice about side effects. You may report side effects to FDA at 1-800-FDA-1088. Where should I keep my medicine? This drug is given in a hospital or clinic and will not be stored at home. NOTE: This sheet is a summary. It may not  cover all possible information. If you have questions about this medicine, talk to your doctor, pharmacist, or health care provider.  2020 Elsevier/Gold Standard (2019-05-26 18:07:58)  Pemetrexed injection What is this medicine? PEMETREXED (PEM e TREX ed) is a chemotherapy drug used to treat lung cancers like non-small cell lung cancer and mesothelioma. It may also be used to treat other cancers. This medicine may be used for other purposes; ask your health care provider or pharmacist if you have questions. COMMON BRAND NAME(S): Alimta What should I tell my health care provider before I take this medicine? They need to know if you have any of these conditions:  infection (especially a virus infection such as chickenpox, cold sores, or herpes)  kidney disease  low blood counts, like low white cell, platelet, or red cell counts  lung or breathing disease, like asthma  radiation therapy  an unusual or allergic reaction to pemetrexed, other medicines, foods, dyes, or preservative  pregnant or trying to get pregnant  breast-feeding How should I use this medicine? This drug is given as an infusion into a vein. It is administered in a hospital or clinic by a specially trained health care professional. Talk to your pediatrician regarding the use of this medicine in children. Special care may be  needed. Overdosage: If you think you have taken too much of this medicine contact a poison control center or emergency room at once. NOTE: This medicine is only for you. Do not share this medicine with others. What if I miss a dose? It is important not to miss your dose. Call your doctor or health care professional if you are unable to keep an appointment. What may interact with this medicine? This medicine may interact with the following medications:  Ibuprofen This list may not describe all possible interactions. Give your health care provider a list of all the medicines, herbs,  non-prescription drugs, or dietary supplements you use. Also tell them if you smoke, drink alcohol, or use illegal drugs. Some items may interact with your medicine. What should I watch for while using this medicine? Visit your doctor for checks on your progress. This drug may make you feel generally unwell. This is not uncommon, as chemotherapy can affect healthy cells as well as cancer cells. Report any side effects. Continue your course of treatment even though you feel ill unless your doctor tells you to stop. In some cases, you may be given additional medicines to help with side effects. Follow all directions for their use. Call your doctor or health care professional for advice if you get a fever, chills or sore throat, or other symptoms of a cold or flu. Do not treat yourself. This drug decreases your body's ability to fight infections. Try to avoid being around people who are sick. This medicine may increase your risk to bruise or bleed. Call your doctor or health care professional if you notice any unusual bleeding. Be careful brushing and flossing your teeth or using a toothpick because you may get an infection or bleed more easily. If you have any dental work done, tell your dentist you are receiving this medicine. Avoid taking products that contain aspirin, acetaminophen, ibuprofen, naproxen, or ketoprofen unless instructed by your doctor. These medicines may hide a fever. Call your doctor or health care professional if you get diarrhea or mouth sores. Do not treat yourself. To protect your kidneys, drink water or other fluids as directed while you are taking this medicine. Do not become pregnant while taking this medicine or for 6 months after stopping it. Women should inform their doctor if they wish to become pregnant or think they might be pregnant. Men should not father a child while taking this medicine and for 3 months after stopping it. This may interfere with the ability to father a  child. You should talk to your doctor or health care professional if you are concerned about your fertility. There is a potential for serious side effects to an unborn child. Talk to your health care professional or pharmacist for more information. Do not breast-feed an infant while taking this medicine or for 1 week after stopping it. What side effects may I notice from receiving this medicine? Side effects that you should report to your doctor or health care professional as soon as possible:  allergic reactions like skin rash, itching or hives, swelling of the face, lips, or tongue  breathing problems  redness, blistering, peeling or loosening of the skin, including inside the mouth  signs and symptoms of bleeding such as bloody or black, tarry stools; red or dark-brown urine; spitting up blood or brown material that looks like coffee grounds; red spots on the skin; unusual bruising or bleeding from the eye, gums, or nose  signs and symptoms of infection like fever  or chills; cough; sore throat; pain or trouble passing urine  signs and symptoms of kidney injury like trouble passing urine or change in the amount of urine  signs and symptoms of liver injury like dark yellow or brown urine; general ill feeling or flu-like symptoms; light-colored stools; loss of appetite; nausea; right upper belly pain; unusually weak or tired; yellowing of the eyes or skin Side effects that usually do not require medical attention (report to your doctor or health care professional if they continue or are bothersome):  constipation  mouth sores  nausea, vomiting  unusually weak or tired This list may not describe all possible side effects. Call your doctor for medical advice about side effects. You may report side effects to FDA at 1-800-FDA-1088. Where should I keep my medicine? This drug is given in a hospital or clinic and will not be stored at home. NOTE: This sheet is a summary. It may not cover all  possible information. If you have questions about this medicine, talk to your doctor, pharmacist, or health care provider.  2020 Elsevier/Gold Standard (2017-09-08 16:11:33)   Carboplatin injection What is this medicine? CARBOPLATIN (KAR boe pla tin) is a chemotherapy drug. It targets fast dividing cells, like cancer cells, and causes these cells to die. This medicine is used to treat ovarian cancer and many other cancers. This medicine may be used for other purposes; ask your health care provider or pharmacist if you have questions. COMMON BRAND NAME(S): Paraplatin What should I tell my health care provider before I take this medicine? They need to know if you have any of these conditions:  blood disorders  hearing problems  kidney disease  recent or ongoing radiation therapy  an unusual or allergic reaction to carboplatin, cisplatin, other chemotherapy, other medicines, foods, dyes, or preservatives  pregnant or trying to get pregnant  breast-feeding How should I use this medicine? This drug is usually given as an infusion into a vein. It is administered in a hospital or clinic by a specially trained health care professional. Talk to your pediatrician regarding the use of this medicine in children. Special care may be needed. Overdosage: If you think you have taken too much of this medicine contact a poison control center or emergency room at once. NOTE: This medicine is only for you. Do not share this medicine with others. What if I miss a dose? It is important not to miss a dose. Call your doctor or health care professional if you are unable to keep an appointment. What may interact with this medicine?  medicines for seizures  medicines to increase blood counts like filgrastim, pegfilgrastim, sargramostim  some antibiotics like amikacin, gentamicin, neomycin, streptomycin, tobramycin  vaccines Talk to your doctor or health care professional before taking any of these  medicines:  acetaminophen  aspirin  ibuprofen  ketoprofen  naproxen This list may not describe all possible interactions. Give your health care provider a list of all the medicines, herbs, non-prescription drugs, or dietary supplements you use. Also tell them if you smoke, drink alcohol, or use illegal drugs. Some items may interact with your medicine. What should I watch for while using this medicine? Your condition will be monitored carefully while you are receiving this medicine. You will need important blood work done while you are taking this medicine. This drug may make you feel generally unwell. This is not uncommon, as chemotherapy can affect healthy cells as well as cancer cells. Report any side effects. Continue your course of  treatment even though you feel ill unless your doctor tells you to stop. In some cases, you may be given additional medicines to help with side effects. Follow all directions for their use. Call your doctor or health care professional for advice if you get a fever, chills or sore throat, or other symptoms of a cold or flu. Do not treat yourself. This drug decreases your body's ability to fight infections. Try to avoid being around people who are sick. This medicine may increase your risk to bruise or bleed. Call your doctor or health care professional if you notice any unusual bleeding. Be careful brushing and flossing your teeth or using a toothpick because you may get an infection or bleed more easily. If you have any dental work done, tell your dentist you are receiving this medicine. Avoid taking products that contain aspirin, acetaminophen, ibuprofen, naproxen, or ketoprofen unless instructed by your doctor. These medicines may hide a fever. Do not become pregnant while taking this medicine. Women should inform their doctor if they wish to become pregnant or think they might be pregnant. There is a potential for serious side effects to an unborn child. Talk  to your health care professional or pharmacist for more information. Do not breast-feed an infant while taking this medicine. What side effects may I notice from receiving this medicine? Side effects that you should report to your doctor or health care professional as soon as possible:  allergic reactions like skin rash, itching or hives, swelling of the face, lips, or tongue  signs of infection - fever or chills, cough, sore throat, pain or difficulty passing urine  signs of decreased platelets or bleeding - bruising, pinpoint red spots on the skin, black, tarry stools, nosebleeds  signs of decreased red blood cells - unusually weak or tired, fainting spells, lightheadedness  breathing problems  changes in hearing  changes in vision  chest pain  high blood pressure  low blood counts - This drug may decrease the number of white blood cells, red blood cells and platelets. You may be at increased risk for infections and bleeding.  nausea and vomiting  pain, swelling, redness or irritation at the injection site  pain, tingling, numbness in the hands or feet  problems with balance, talking, walking  trouble passing urine or change in the amount of urine Side effects that usually do not require medical attention (report to your doctor or health care professional if they continue or are bothersome):  hair loss  loss of appetite  metallic taste in the mouth or changes in taste This list may not describe all possible side effects. Call your doctor for medical advice about side effects. You may report side effects to FDA at 1-800-FDA-1088. Where should I keep my medicine? This drug is given in a hospital or clinic and will not be stored at home. NOTE: This sheet is a summary. It may not cover all possible information. If you have questions about this medicine, talk to your doctor, pharmacist, or health care provider.  2020 Elsevier/Gold Standard (2007-10-25 14:38:05)

## 2020-02-14 NOTE — Progress Notes (Signed)
Per Dr. Julien Nordmann, okay to treat with elevated heart rate of 107.

## 2020-02-15 ENCOUNTER — Other Ambulatory Visit: Payer: Self-pay | Admitting: Physician Assistant

## 2020-02-15 ENCOUNTER — Other Ambulatory Visit: Payer: Self-pay | Admitting: Student

## 2020-02-15 ENCOUNTER — Other Ambulatory Visit: Payer: Self-pay | Admitting: Radiology

## 2020-02-16 ENCOUNTER — Ambulatory Visit (HOSPITAL_COMMUNITY)
Admission: RE | Admit: 2020-02-16 | Discharge: 2020-02-16 | Disposition: A | Discharge status: Home / Self Care | Payer: BC Managed Care – PPO | Source: Ambulatory Visit | Attending: Physician Assistant | Admitting: Physician Assistant

## 2020-02-16 ENCOUNTER — Telehealth: Payer: Self-pay

## 2020-02-16 ENCOUNTER — Encounter: Payer: Self-pay | Admitting: Internal Medicine

## 2020-02-16 ENCOUNTER — Other Ambulatory Visit: Payer: Self-pay

## 2020-02-16 ENCOUNTER — Encounter (HOSPITAL_COMMUNITY): Payer: Self-pay

## 2020-02-16 DIAGNOSIS — Z85118 Personal history of other malignant neoplasm of bronchus and lung: Secondary | ICD-10-CM | POA: Diagnosis not present

## 2020-02-16 DIAGNOSIS — C770 Secondary and unspecified malignant neoplasm of lymph nodes of head, face and neck: Secondary | ICD-10-CM | POA: Diagnosis not present

## 2020-02-16 DIAGNOSIS — Z7984 Long term (current) use of oral hypoglycemic drugs: Secondary | ICD-10-CM | POA: Insufficient documentation

## 2020-02-16 DIAGNOSIS — C3491 Malignant neoplasm of unspecified part of right bronchus or lung: Secondary | ICD-10-CM | POA: Diagnosis not present

## 2020-02-16 DIAGNOSIS — R590 Localized enlarged lymph nodes: Secondary | ICD-10-CM | POA: Diagnosis not present

## 2020-02-16 DIAGNOSIS — I10 Essential (primary) hypertension: Secondary | ICD-10-CM | POA: Insufficient documentation

## 2020-02-16 DIAGNOSIS — E119 Type 2 diabetes mellitus without complications: Secondary | ICD-10-CM | POA: Insufficient documentation

## 2020-02-16 DIAGNOSIS — J45909 Unspecified asthma, uncomplicated: Secondary | ICD-10-CM | POA: Diagnosis not present

## 2020-02-16 DIAGNOSIS — Z7901 Long term (current) use of anticoagulants: Secondary | ICD-10-CM | POA: Diagnosis not present

## 2020-02-16 DIAGNOSIS — Z79890 Hormone replacement therapy (postmenopausal): Secondary | ICD-10-CM | POA: Diagnosis not present

## 2020-02-16 DIAGNOSIS — Z79899 Other long term (current) drug therapy: Secondary | ICD-10-CM | POA: Insufficient documentation

## 2020-02-16 MED ORDER — SODIUM CHLORIDE 0.9 % IV SOLN: INTRAVENOUS | Status: DC

## 2020-02-16 MED ORDER — MIDAZOLAM HCL 2 MG/2ML IJ SOLN
INTRAMUSCULAR | Status: AC
  Filled 2020-02-16: qty 2

## 2020-02-16 MED ORDER — FENTANYL CITRATE (PF) 100 MCG/2ML IJ SOLN
INTRAMUSCULAR | Status: AC | PRN
  Administered 2020-02-16: 50 ug via INTRAVENOUS

## 2020-02-16 MED ORDER — FENTANYL CITRATE (PF) 100 MCG/2ML IJ SOLN
INTRAMUSCULAR | Status: AC
  Filled 2020-02-16: qty 2

## 2020-02-16 MED ORDER — MIDAZOLAM HCL 2 MG/2ML IJ SOLN
INTRAMUSCULAR | Status: AC | PRN
  Administered 2020-02-16: 1 mg via INTRAVENOUS

## 2020-02-16 MED ORDER — LIDOCAINE HCL (PF) 1 % IJ SOLN
INTRAMUSCULAR | Status: AC
  Filled 2020-02-16: qty 30

## 2020-02-16 NOTE — Telephone Encounter (Signed)
Message left for patient on his VM that the Sonafine cream he inquired about is in and to call and let us know when he would like to pick it up and it will be placed on the first floor with the registration desk. Contact information provided.

## 2020-02-16 NOTE — H&P (Signed)
Chief Complaint: Patient was seen in consultation today for lymphadenopathy  Referring Physician(s): Heilingoetter,Cassandra L  Supervising Physician: Sandi Mariscal  Patient Status: [facility] - In-pt  History of Present Illness: Brian [name] is a 59 y.o. 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  Patient has completed concurrent chemoradiation in March 2020, the underwent additional radiation in September 2020. A recent PET scan showed increased lymphadenopathy.   Patient assessed in [facility] prior to procedure.  He does have erythema as well as visible and palpable lymphadenopathy.  He reports no change to the redness around his neck and chest since radiation was completed in September 2020. He is agreeable to biopsy.  He has been NPO.  He does not take blood thinners.   Past Medical History:  Diagnosis Date  . Asthma   . Diabetes mellitus without complication ([place])   . Hypertension   . nscl ca dx'd 08/2018  . Subarachnoid hemorrhage [facility])     Past Surgical History:  Procedure Laterality Date  . LEG SURGERY  age 79    left leg, from fracture    Allergies: Patient has no known allergies.  Medications: Prior to Admission medications   Medication Sig Start Date End Date Taking? Authorizing Provider  albuterol (VENTOLIN HFA) 108 (90 Base) MCG/ACT inhaler Inhale 2 puffs into the lungs every 4 (four) hours as needed for wheezing or shortness of breath. 01/02/20  Yes [name], MD  amLODipine (NORVASC) 5 MG tablet Take 5 mg by mouth daily.  07/20/18  Yes [provider]  atorvastatin (LIPITOR) 20 MG tablet Take 20 mg by mouth daily.  12/10/17  Yes [provider]  benzonatate (TESSALON) 100 MG capsule Take 1 capsule (100 mg total) by mouth 3 (three) times daily as needed for cough. 02/07/20  Yes Heilingoetter, Cassandra L, PA-C  cholecalciferol  (VITAMIN D3) 25 MCG (1000 UNIT) tablet Take 1,000 Units by mouth daily.   Yes [provider]  folic acid (FOLVITE) 1 MG tablet Take 1 tablet (1 mg total) by mouth daily. 02/07/20  Yes Heilingoetter, Cassandra L, PA-C  levothyroxine (SYNTHROID) 50 MCG tablet Take 1 tablet (50 mcg total) by mouth daily before breakfast. 02/14/20  Yes Heilingoetter, Cassandra L, PA-C  metFORMIN (GLUCOPHAGE) 500 MG tablet Take 500 mg by mouth daily with breakfast.  12/10/17  Yes [provider]  Multiple Vitamin (MULTIVITAMIN) tablet Take 1 tablet by mouth daily.   Yes [provider]  prochlorperazine (COMPAZINE) 10 MG tablet Take 1 tablet (10 mg total) by mouth every 6 (six) hours as needed. Patient taking differently: Take 10 mg by mouth every 6 (six) hours as needed for nausea or vomiting.  02/07/20  Yes Heilingoetter, Cassandra L, PA-C  Propylene Glycol (SYSTANE BALANCE OP) Apply 1 drop to eye daily as needed (eye irritation).   Yes [provider]  Respiratory Therapy Supplies (FLUTTER) DEVI Please use up 10 times daily(4-5 breaths, 4-5 times daily) 03/07/19  Yes Lauraine Rinne, NP  rivaroxaban (XARELTO) 20 MG TABS tablet Take 1 tablet (20 mg total) by mouth daily with supper. 01/04/20  Yes Curt Bears, MD  TRELEGY ELLIPTA 100-62.5-25 MCG/INH AEPB Inhale 1 puff into the lungs daily. 01/09/20  Yes [provider]  triamcinolone lotion (KENALOG) 0.1 % Apply 1 application topically 4 (four) times daily. Patient taking differently: Apply 1 application topically 2 (two) times daily as needed (irritation).  12/14/19  Yes Harle Stanford., PA-C  venlafaxine (EFFEXOR) 37.5 MG tablet Take 37.5 mg by mouth daily.  12/10/17  Yes [provider]  cephALEXin (KEFLEX) 500 MG capsule Take 1 capsule (500 mg total) by mouth 4 (four) times daily. Patient not taking: Reported on 02/13/2020 01/24/20   Heilingoetter, Cassandra L, PA-C     Family History  Problem Relation Age of Onset  .  Colon cancer Neg Hx   . Stomach cancer Neg Hx     Social History   Socioeconomic History  . Marital status: Married    Spouse name: Not on file  . Number of children: Not on file  . Years of education: Not on file  . Highest education level: Not on file  Occupational History  . Not on file  Tobacco Use  . Smoking status: Never Smoker  . Smokeless tobacco: Never Used  Vaping Use  . Vaping Use: Never used  Substance and Sexual Activity  . Alcohol use: Yes    Alcohol/week: 5.0 standard drinks    Types: 5 Standard drinks or equivalent per week    Comment: occasionally  . Drug use: No  . Sexual activity: Not on file  Other Topics Concern  . Not on file  Social History Narrative  . Not on file   Social Determinants of Health   Financial Resource Strain:   . Difficulty of Paying Living Expenses:   Food Insecurity:   . Worried About Charity fundraiser in the Last Year:   . Arboriculturist in the Last Year:   Transportation Needs:   . Film/video editor (Medical):   Marland Kitchen Lack of Transportation (Non-Medical):   Physical Activity:   . Days of Exercise per Week:   . Minutes of Exercise per Session:   Stress:   . Feeling of Stress :   Social Connections:   . Frequency of Communication with Friends and Family:   . Frequency of Social Gatherings with Friends and Family:   . Attends Religious Services:   . Active Member of Clubs or Organizations:   . Attends Archivist Meetings:   Marland Kitchen Marital Status:      Review of Systems: A 12 point ROS discussed and pertinent positives are indicated in the HPI above.  All other systems are negative.  Review of Systems  Constitutional: Negative for fatigue and fever.  HENT: Negative for trouble swallowing.   Respiratory: Positive for cough. Negative for shortness of breath.   Cardiovascular: Negative for chest pain.  Gastrointestinal: Negative for abdominal pain, nausea and vomiting.  Genitourinary: Negative for dysuria.    Musculoskeletal: Negative for back pain.  Psychiatric/Behavioral: Negative for behavioral problems and confusion.    Vital Signs: BP (!) 130/94 (BP Location: Left Arm)   Pulse 95   Temp 97.7 F (36.5 C) (Oral)   Resp 20   Ht 5\' 8"  (1.727 m)   Wt 173 lb (78.5 kg)   SpO2 95%   BMI 26.30 kg/m   Physical Exam Vitals and nursing note reviewed.  Constitutional:      General: He is not in acute distress.    Appearance: Normal appearance.  HENT:     Mouth/Throat:     Mouth: Mucous membranes are moist.     Pharynx: Oropharynx is clear.  Cardiovascular:     Rate and Rhythm: Normal rate and regular rhythm.  Pulmonary:     Effort: Pulmonary effort is normal.     Breath sounds: Normal  breath sounds.  Abdominal:     General: Abdomen is flat.     Palpations: Abdomen is soft.  Skin:    General: Skin is warm and dry.  Neurological:     General: No focal deficit present.     Mental Status: He is alert and oriented to person, place, and time. Mental status is at baseline.  Psychiatric:        Mood and Affect: Mood normal.        Behavior: Behavior normal.        Thought Content: Thought content normal.        Judgment: Judgment normal.      MD Evaluation Airway: WNL Heart: WNL Abdomen: WNL Chest/ Lungs: WNL ASA  Classification: 3 Mallampati/Airway Score: One   Imaging: CT Soft Tissue Neck W Contrast  Result Date: 01/22/2020 CLINICAL DATA:  Lung cancer, history of lymph nodes in the neck EXAM: CT NECK WITH CONTRAST TECHNIQUE: Multidetector CT imaging of the neck was performed using the standard protocol following the bolus administration of intravenous contrast. CONTRAST:  17mL OMNIPAQUE IOHEXOL 300 MG/ML  SOLN COMPARISON:  11/20/2019 FINDINGS: Pharynx and larynx: Unremarkable with no mass or swelling. Salivary glands: Unremarkable. Thyroid: Normal. Lymph nodes: There are no pathologically enlarged or necrotic lymph nodes identified. Nonspecific slight increase in size of  some left level 2 nodes (series 2, image 55) and left supraclavicular nodes (series 2, image 83-92). Vascular: Major neck vessels are patent. Limited intracranial: No abnormal enhancement. Visualized orbits: Unremarkable Mastoids and visualized paranasal sinuses: Aerated. Skeleton: Cervical spine degenerative changes, greatest at C5-C6. Upper chest: See separately dictated CT chest. Other: None. IMPRESSION: No pathologically enlarged lymph nodes. Nonspecific slight increase in size of some left level 2 and left supraclavicular nodes for which attention on follow-up is recommended. Electronically Signed   By: Macy Mis M.D.   On: 01/22/2020 20:49   CT Chest W Contrast  Result Date: 01/22/2020 CLINICAL DATA:  75 ml omni 300 Pt.states hx of Lung ca dx'd 2020 with mets to the lymph nodes and neck, immunotherapy complete 04/2019, xrt to the lung and neck complete, chemo complete EXAM: CT CHEST WITH CONTRAST TECHNIQUE: Multidetector CT imaging of the chest was performed during intravenous contrast administration. CONTRAST:  27mL OMNIPAQUE IOHEXOL 300 MG/ML  SOLN COMPARISON:  None. FINDINGS: Cardiovascular: No significant vascular findings. Normal heart size. No pericardial effusion. Mediastinum/Nodes: No axillary or supraclavicular adenopathy. No mediastinal hilar adenopathy. No pericardial effusion. Mild thickening the esophagus. Lungs/Pleura: Perihilar consolidation with air bronchograms in the RIGHT lung are similar comparison exam. However, there are newly enlarged nodules within the superior segment of the RIGHT lower lobe and RIGHT upper lobe. For example 16 mm nodule on image 78/6 is increased from 8 mm. Adjacent 14 mm nodule on image 76/6 is not clearly seen. More superiorly in the RIGHT lower lobe 17 mm nodule on CT image 69 compared to 21 mm. IN the the upper lobe, 13 mm nodule on image 54/6 increased from 6 mm. No suspicious nodules in the LEFT lung. Upper Abdomen: Limited view of the liver, kidneys,  pancreas are unremarkable. Normal adrenal glands. Retroperitoneal nodule/node adjacent the crus of the diaphragm and LEFT adrenal gland measures 11 mm (image 55/3 compared to 7 mm. Musculoskeletal: No aggressive osseous lesion. IMPRESSION: 1. Interval increase in size of multiple RIGHT lung pulmonary nodules highly concerning for lung cancer recurrence. Pulmonary infection is less favored. 2. Stable perihilar consolidation in the RIGHT lung. 3. Interval increase in size  of small retroperitoneal lymph node adjacent to the crus of the diaphragm and LEFT adrenal gland. Concerning for upper abdominal nodal metastasis. 4. Consider FDG PET imaging to evaluate extent of suspected lung cancer recurrence. Electronically Signed   By: Suzy Bouchard M.D.   On: 01/22/2020 13:45   NM PET Image Restag (PS) Skull Base To Thigh  Result Date: 02/02/2020 CLINICAL DATA:  Subsequent treatment strategy for lung cancer. Primary Cancer Type: Lung Imaging Indication: Restaging for new clinical signs or symptoms Interval therapy since last imaging? No Initial Cancer Diagnosis Date: 08/23/2018; Established by: Biopsy-proven Detailed Pathology: Stage IIIB non-small cell lung cancer, adenocarcinoma. Primary Tumor location: Right upper lobe mass, mediastinal and bilateral supraclavicular lymphadenopathy. Recurrence? Yes; Date(s) of recurrence: 01/22/2020; Established by: Imaging only Chemotherapy: Yes; Ongoing? No; Most recent administration: 10/10/2018 Immunotherapy?  Yes; Type: Imfinzi; Ongoing? No Radiation therapy? Yes; Date Range: 03/02/2019-04/13/2019; Target: Right neck EXAM: NUCLEAR MEDICINE PET SKULL BASE TO THIGH TECHNIQUE: 8.83 mCi F-18 FDG was injected intravenously. Full-ring PET imaging was performed from the skull base to thigh after the radiotracer. CT data was obtained and used for attenuation correction and anatomic localization. Fasting blood glucose: 104 mg/dl COMPARISON:  Most recent CT chest 01/22/2020,  08/11/2018  PET-CT. FINDINGS: Mediastinal blood pool activity: SUV max 2.6 Liver activity: SUV max 3.5 NECK: Interval decrease in size and metabolic activity of supraclavicular lymph nodes. One small LEFT level 3 lymph node SUV max equal 8.2 is new from prior (image 43). Incidental CT findings: none CHEST: RIGHT lower paratracheal adenopathy has resolved. Subcarinal lymph node SUV max equal 7.0 increased from 3.9. Interval resolution in RIGHT hilar hypermetabolic adenopathy Within the RIGHT lung previously seen hypermetabolic nodule resolved. However there is peribronchial thickening with nodularity in the central bronchovascular structures about the RIGHT hilum and RIGHT lower lobe with SUV max equal 4.7. Nodules measure up to 12 mm (image 79/4) There multiple new hypermetabolic RIGHT axillary lymph nodes with SUV max equal 7.8. Nodes are small. Incidental CT findings: none ABDOMEN/PELVIS: New hypermetabolic lymph node at the crus of the diaphragm with SUV max equal 8.5. This node is small measuring 9 mm in the gastrohepatic ligament. No abnormal activity liver. NO hypermetabolic lymph nodes elsewhere in the abdomen pelvis. Adrenal glands normal Incidental CT findings: none SKELETON: No focal hypermetabolic activity to suggest skeletal metastasis. Incidental CT findings: none IMPRESSION: 1. Mixed response to therapy. 2. Resolution large hypermetabolic nodule in the RIGHT lower lobe as well as resolution hypermetabolic RIGHT hilar, paratracheal adenopathy and supraclavicular adenopathy. 3. However, there is new peribronchial thickening in the RIGHT hilar lung with nodularity and moderate metabolic activity. Differential would include lymphangitic spread of carcinoma, lung cancer recurrence, or drug/immune related response. The metabolic activity is much less than the original tumor activity however the CT findings are concerning. 4. Several new small hypermetabolic lymph nodes including a subcarinal lymph node, level III LEFT  neck lymph node, and a lymph node at the crus of the diaphragm in thethe upper abdomen. These all nodes are very small but intensely hypermetabolic. 5. Multiple new hypermetabolic small RIGHT axillary lymph nodes also concerning for metastasis although COVID vaccination could elicit this response (vaccination in April unclear which arm). Electronically Signed   By: Suzy Bouchard M.D.   On: 02/02/2020 11:00    Labs:  CBC: Recent Labs    11/22/19 1431 11/26/19 1319 01/22/20 0915 02/14/20 1006  WBC 10.5 9.7 14.0* 17.7*  HGB 14.3 14.8 14.8 15.0  HCT 42.7 44.7 44.1  44.9  PLT 213 216 268 145*    COAGS: Recent Labs    11/26/19 1319  INR 1.1    BMP: Recent Labs    11/22/19 1431 11/26/19 1319 01/22/20 0915 02/14/20 1006  NA 139 139 138 137  K 4.0 4.2 4.5 4.1  CL 105 106 106 106  CO2 26 25 21* 21*  GLUCOSE 112* 94 115* 99  BUN 15 14 19 17   CALCIUM 8.9 9.1 9.4 8.5*  CREATININE 1.15 0.81 0.88 0.78  GFRNONAA >60 >60 >60 >60  GFRAA >60 >60 >60 >60    LIVER FUNCTION TESTS: Recent Labs    11/22/19 1431 11/26/19 1319 01/22/20 0915 02/14/20 1006  BILITOT <0.2* 0.4 0.3 0.5  AST 26 24 19 26   ALT 40 27 26 40  ALKPHOS 95 90 134* 103  PROT 6.8 6.8 6.8 6.0*  ALBUMIN 3.8 3.9 3.7 3.0*    TUMOR MARKERS: No results for input(s): AFPTM, CEA, CA199, CHROMGRNA in the last 8760 hours.  Assessment and Plan: Patient with past medical history of lung cancer presents with complaint of lymphadenopathy.  IR consulted for lymph node biopsy. Case reviewed by Dr. Anselm Pancoast who approves patient for procedure.  Patient presents today in their usual state of health.  He has been NPO and is not currently on blood thinners.   Risks and benefits were discussed with the patient and/or patient's family including, but not limited to bleeding, infection, damage to adjacent structures or low yield requiring additional tests.  All of the questions were answered and there is agreement to  proceed.  Consent signed and in chart.  Thank you for this interesting consult.  I greatly enjoyed meeting Maruice Pieroni and look forward to participating in their care.  A copy of this report was sent to the requesting provider on this date.  Electronically Signed: Docia Barrier, PA 02/16/2020, 1:02 PM   I spent a total of  30 Minutes   in face to face in clinical consultation, greater than 50% of which was counseling/coordinating care for lymphadenopathy.

## 2020-02-16 NOTE — Procedures (Signed)
Pre Procedure Dx: Left cervical lympadenopathy Post Procedural Dx: Same  Technically successful US guided biopsy of indeterminate left cervical lymph node.  EBL: None No immediate complications.   Ronny Bacon, MD Pager #: [phone]

## 2020-02-16 NOTE — Discharge Instructions (Signed)

## 2020-02-17 ENCOUNTER — Encounter: Payer: Self-pay | Admitting: Internal Medicine

## 2020-02-18 ENCOUNTER — Ambulatory Visit (INDEPENDENT_AMBULATORY_CARE_PROVIDER_SITE_OTHER): Payer: BC Managed Care – PPO

## 2020-02-18 ENCOUNTER — Encounter (HOSPITAL_COMMUNITY): Payer: Self-pay

## 2020-02-18 ENCOUNTER — Ambulatory Visit (HOSPITAL_COMMUNITY): Payer: BC Managed Care – PPO

## 2020-02-18 ENCOUNTER — Ambulatory Visit (HOSPITAL_COMMUNITY)
Admission: EM | Admit: 2020-02-18 | Discharge: 2020-02-18 | Disposition: A | Discharge status: Home / Self Care | Payer: BC Managed Care – PPO | Attending: Family Medicine | Admitting: Family Medicine

## 2020-02-18 ENCOUNTER — Other Ambulatory Visit: Payer: Self-pay

## 2020-02-18 DIAGNOSIS — J90 Pleural effusion, not elsewhere classified: Secondary | ICD-10-CM | POA: Diagnosis not present

## 2020-02-18 DIAGNOSIS — R0602 Shortness of breath: Secondary | ICD-10-CM

## 2020-02-18 DIAGNOSIS — R05 Cough: Secondary | ICD-10-CM | POA: Diagnosis not present

## 2020-02-18 DIAGNOSIS — J80 Acute respiratory distress syndrome: Secondary | ICD-10-CM | POA: Diagnosis not present

## 2020-02-18 DIAGNOSIS — C3491 Malignant neoplasm of unspecified part of right bronchus or lung: Secondary | ICD-10-CM | POA: Diagnosis not present

## 2020-02-18 DIAGNOSIS — J9811 Atelectasis: Secondary | ICD-10-CM | POA: Diagnosis not present

## 2020-02-18 MED ORDER — BENZONATATE 200 MG PO CAPS
ORAL_CAPSULE | ORAL | 0 refills | Status: DC
Start: 2020-02-18 — End: 2020-02-26

## 2020-02-18 NOTE — Discharge Instructions (Addendum)
Try to drink more water and fluids Take the Delsym 2 times a day and the Tessalon 2-3 times a day Call your doctors tomorrow for advice

## 2020-02-18 NOTE — ED Provider Notes (Signed)
[place]    CSN: [id number] Arrival date & time: 02/18/20  1432      History   Chief Complaint Chief Complaint  Patient presents with  . Shortness of Breath    HPI Brian [name] is a 59 y.o. male.   HPI   Pleasant 59 year old gentleman with metastatic lung cancer.  He states that he has been through chemotherapy, radiation therapy, and immunotherapy.  He is currently on immunotherapy and chemotherapy for the last month or so.  He states he is lost 14 pounds in the last 3 weeks.  The has no appetite.  He drinks very little because fluids make him nauseated. He had a recent scan.  It showed metastatic disease.  He had a biopsy of his left cervical node. After this he has had increased SOB and cough.  Very tired.  NO fever or chills.  No chest pain.  Recent oncology notes and imaging are reviewed Past Medical History:  Diagnosis Date  . Asthma   . Diabetes mellitus without complication ([place])   . Hypertension   . nscl ca dx'd 08/2018  . Subarachnoid hemorrhage [facility])     Patient Active Problem List   Diagnosis Date Noted  . Rash 02/07/2020  . Adenocarcinoma of right lung, stage 4 ([place]) 02/07/2020  . Chronic cough 12/20/2019  . Venous thrombosis 09/21/2019  . Shortness of breath 09/07/2019  . Headache 08/09/2019  . Lobar pneumonia, unspecified organism ([place]) 03/07/2019  . Centrilobular emphysema ([place]) 03/07/2019  . Encounter for antineoplastic immunotherapy 11/10/2018  . Adenocarcinoma of right lung, stage 3 ([place]) 08/25/2018  . Goals of care, counseling/discussion 08/25/2018  . Encounter for antineoplastic chemotherapy 08/25/2018  . Mass of upper lobe of right lung 08/04/2018  . Subarachnoid hemorrhage ([place]) 05/22/2015    Past Surgical History:  Procedure Laterality Date  . LEG SURGERY  age 89    left leg, from fracture       Home Medications    Prior to Admission medications   Medication Sig Start Date End Date Taking? Authorizing Provider    albuterol (VENTOLIN HFA) 108 (90 Base) MCG/ACT inhaler Inhale 2 puffs into the lungs every 4 (four) hours as needed for wheezing or shortness of breath. 01/02/20   [name], MD  amLODipine (NORVASC) 5 MG tablet Take 5 mg by mouth daily.  07/20/18   [provider]  atorvastatin (LIPITOR) 20 MG tablet Take 20 mg by mouth daily.  12/10/17   [provider]  benzonatate (TESSALON) 200 MG capsule Take one cap 2-3 times a day for cough 02/18/20   Raylene Everts, MD  cephALEXin (KEFLEX) 500 MG capsule Take 1 capsule (500 mg total) by mouth 4 (four) times daily. Patient not taking: Reported on 02/13/2020 01/24/20   Heilingoetter, Cassandra L, PA-C  cholecalciferol (VITAMIN D3) 25 MCG (1000 UNIT) tablet Take 1,000 Units by mouth daily.    [provider]  folic acid (FOLVITE) 1 MG tablet Take 1 tablet (1 mg total) by mouth daily. 02/07/20   Heilingoetter, Cassandra L, PA-C  levothyroxine (SYNTHROID) 50 MCG tablet Take 1 tablet (50 mcg total) by mouth daily before breakfast. 02/14/20   Heilingoetter, Cassandra L, PA-C  metFORMIN (GLUCOPHAGE) 500 MG tablet Take 500 mg by mouth daily with breakfast.  12/10/17   [provider]  Multiple Vitamin (MULTIVITAMIN) tablet Take 1 tablet by mouth daily.    [provider]  prochlorperazine (COMPAZINE) 10 MG tablet Take 1 tablet (10 mg total) by  mouth every 6 (six) hours as needed. Patient taking differently: Take 10 mg by mouth every 6 (six) hours as needed for nausea or vomiting.  02/07/20   Heilingoetter, Cassandra L, PA-C  Propylene Glycol (SYSTANE BALANCE OP) Apply 1 drop to eye daily as needed (eye irritation).    [provider]  Respiratory Therapy Supplies (FLUTTER) DEVI Please use up 10 times daily(4-5 breaths, 4-5 times daily) 03/07/19   Lauraine Rinne, NP  rivaroxaban (XARELTO) 20 MG TABS tablet Take 1 tablet (20 mg total) by mouth daily with supper. 01/04/20   Curt Bears, MD  TRELEGY ELLIPTA 100-62.5-25  MCG/INH AEPB Inhale 1 puff into the lungs daily. 01/09/20   [provider]  triamcinolone lotion (KENALOG) 0.1 % Apply 1 application topically 4 (four) times daily. Patient taking differently: Apply 1 application topically 2 (two) times daily as needed (irritation).  12/14/19   Tanner, Lyndon Code., PA-C  venlafaxine (EFFEXOR) 37.5 MG tablet Take 37.5 mg by mouth daily.  12/10/17   [provider]    Family History Family History  Problem Relation Age of Onset  . Colon cancer Neg Hx   . Stomach cancer Neg Hx     Social History Social History   Tobacco Use  . Smoking status: Never Smoker  . Smokeless tobacco: Never Used  Vaping Use  . Vaping Use: Never used  Substance Use Topics  . Alcohol use: Yes    Alcohol/week: 5.0 standard drinks    Types: 5 Standard drinks or equivalent per week    Comment: occasionally  . Drug use: No     Allergies   Patient has no known allergies.   Review of Systems Review of Systems See HPI  Physical Exam Triage Vital Signs ED Triage Vitals  Enc Vitals Group     BP 02/18/20 1435 117/81     Pulse Rate 02/18/20 1435 (!) 123     Resp 02/18/20 1435 (!) 32     Temp 02/18/20 1435 98.7 F (37.1 C)     Temp Source 02/18/20 1435 Oral     SpO2 02/18/20 1435 96 %     Weight --      Height --      Head Circumference --      Peak Flow --      Pain Score 02/18/20 1441 0     Pain Loc --      Pain Edu? --      Excl. in [place]? --    No data found.  Updated Vital Signs BP 117/81 (BP Location: Right Arm)   Pulse (!) 123   Temp 98.7 F (37.1 C) (Oral)   Resp (!) 22   SpO2 96%     Physical Exam Constitutional:      General: He is not in acute distress.    Appearance: He is well-developed. He is ill-appearing.     Comments: Tired.  Harsh cough  HENT:     Head: Normocephalic and atraumatic.     Mouth/Throat:     Mouth: Mucous membranes are moist.  Eyes:     Conjunctiva/sclera: Conjunctivae normal.     Pupils: Pupils are equal,  round, and reactive to light.  Cardiovascular:     Rate and Rhythm: Regular rhythm. Tachycardia present.     Heart sounds: Normal heart sounds.  Pulmonary:     Comments:  Decreased BS on the right, tachypnea Abdominal:     General: There is no distension.  Palpations: Abdomen is soft.  Musculoskeletal:        General: Normal range of motion.     Cervical back: Normal range of motion.  Skin:    General: Skin is warm and dry.  Neurological:     General: No focal deficit present.     Mental Status: He is alert.  Psychiatric:        Mood and Affect: Mood normal.        Behavior: Behavior normal.      UC Treatments / Results  Labs (all labs ordered are listed, but only abnormal results are displayed) Labs Reviewed - No data to display  EKG   Radiology DG Chest 2 View  Result Date: 02/18/2020 CLINICAL DATA:  Cancer, cough, shortness of breath EXAM: CHEST - 2 VIEW COMPARISON:  PET-CT 02/02/2020, radiograph 11/26/2019, CT chest 01/22/2020 FINDINGS: Dense bandlike opacity, likely scarring or subsegmental atelectasis extending from the previously seen right hilar/upper lobe mass with more diffuse interstitial thickening and reticular nodularity throughout the right lung compatible lymphangitis carcinomatosis. The left lung remains largely clear. Small right pleural effusion. No left effusion. No pneumothorax. Stable cardiomediastinal contours. No acute osseous or soft tissue abnormality. Degenerative changes are present in the imaged spine and shoulders. IMPRESSION: 1. Redemonstrated right hilar/upper lobe mass with more diffuse interstitial thickening and reticular nodularity throughout the right lung compatible with lymphangitis carcinomatosis. 2. Bandlike opacity extending towards the lung periphery from the hilar lesion likely reflect some and or subsegmental atelectasis. 3. Small right pleural effusion. Electronically Signed   By: Lovena Le M.D.   On: 02/18/2020 15:35     Procedures Procedures (including critical care time)  Medications Ordered in UC Medications - No data to display  Initial Impression / Assessment and Plan / UC Course  I have reviewed the triage vital signs and the nursing notes.  Pertinent labs & imaging results that were available during my care of the patient were reviewed by me and considered in my medical decision making (see chart for details).     I told patient that he had shortness of breath because of congestion in his left lung due to drainage problems.  He did not need an antibiotic.  At home the treatment might include steroids but this is better instituted by his oncology and pulmonary specialist. He is encouraged to try to eat and drink more.  I am worried about his weight loss I did refill his Tessalon at a higher dose.  200 mg twice daily.  He states he still coughing in spite of taking the Tessalon 100 mg and Delsym Final Clinical Impressions(s) / UC Diagnoses   Final diagnoses:  Adenocarcinoma of right lung, stage 3 (HCC)     Discharge Instructions     Try to drink more water and fluids Take the Delsym 2 times a day and the Tessalon 2-3 times a day Call your doctors tomorrow for advice    ED Prescriptions    Medication Sig Dispense Auth. Provider   benzonatate (TESSALON) 200 MG capsule Take one cap 2-3 times a day for cough 20 capsule Raylene Everts, MD     PDMP not reviewed this encounter.   Raylene Everts, MD 02/18/20 [phone]

## 2020-02-18 NOTE — ED Triage Notes (Signed)
Pt presents with shortness of breath for the past 2 days. States loosing 14 pounds in the past 3 weeks unintentionally.

## 2020-02-19 LAB — SURGICAL PATHOLOGY

## 2020-02-20 ENCOUNTER — Other Ambulatory Visit: Payer: Self-pay | Admitting: Physician Assistant

## 2020-02-20 ENCOUNTER — Telehealth: Payer: Self-pay | Admitting: Medical Oncology

## 2020-02-20 NOTE — Progress Notes (Deleted)
[facility] OFFICE PROGRESS NOTE  Brian Broker, MD [address] [place] [address]  DIAGNOSIS: Stage IIIB (T1c, N3, M0)non-small cell lung cancer, adenocarcinoma presented with right upper lobe lung mass in addition to mediastinal and bilateral supraclavicular lymphadenopathy diagnosed in January 2020.  PD-L1: 10%  Guardant 360 molecular studiesshowed no actionable mutation  PRIOR THERAPY: 1)Concurrent chemoradiation with weekly carboplatin for AUC of 2 and paclitaxel 45 mg/M2.Status post 6 cycles. Last dose was given on October 10, 2018 with stable disease. 2) Radiation treatment to the enlarging right cervical lymph nodes under the care of Dr. Sondra Come. First treatment 03/06/2019. Last treatment scheduledon9/05/2019 3)Consolidation treatment with immunotherapy with Imfinzi 10 mg/KG every 2 weeks. First dose November 17, 2018. Status post26cycles  CURRENT THERAPY: Systemic chemotherapy with carboplatin for an AUC of 5, Alimta 500 mg/m2, and Keytruda 200 mg IV every 3 weeks. First dose expected on 02/14/20. Status post 1 cycle.   INTERVAL HISTORY: Brian [name] 59 y.o. male returns to clinic today for follow-up visit.  The patient recently had a CT scan as well as a PET scan which showed evidence of disease recurrence.  The patient recently had a biopsy of one of the left cervical lymph nodes which was positive for metastatic adenocarcinoma.  The patient recently started systemic chemotherapy with carboplatin, Alimta, and Keytruda.  He is status post 1 cycle and he tolerated it __without any concerning adverse side effects except for. _  In the interval since his last treatment, the patient was seen in the emergency room for the chief complaint of shortness of breath.  The work-up was reassuring and the patient was given a refill of a higher dose of Tessalon.  He denies any recent fever, chills, or night sweats.  The patient is endorsing weight loss of  _pounds over _week.Marland Kitchen  He continues to report shortness of breath and a cough which produces yellow/clear phlegm.  He follows with pulmonology.  He denies any chest pain or hemoptysis.  He denies any nausea, vomiting, diarrhea, or constipation.  He still continues to have a rash in the right cervical neck area.  He recently picked up a prescription for Sonafine.  A referral was placed to dermatology but has not been scheduled yet.  The patient also requested a second opinion at [facility] by Dr. Durenda Hurt.  He has an appointment scheduled for __.  He also had a brain MRI performed today to evaluate headaches and to rule out metastatic disease to the brain.  The patient is here today for evaluation in 1 week follow-up visit after completing his first cycle of his new chemotherapy treatment.  MEDICAL HISTORY: Past Medical History:  Diagnosis Date  . Asthma   . Diabetes mellitus without complication ([facility])   . Hypertension   . nscl ca dx'd 08/2018  . Subarachnoid hemorrhage ([facility])     ALLERGIES:  has No Known Allergies.  MEDICATIONS:  Current Outpatient Medications  Medication Sig Dispense Refill  . albuterol (VENTOLIN HFA) 108 (90 Base) MCG/ACT inhaler Inhale 2 puffs into the lungs every 4 (four) hours as needed for wheezing or shortness of breath. 6.7 g 5  . amLODipine (NORVASC) 5 MG tablet Take 5 mg by mouth daily.     Marland Kitchen atorvastatin (LIPITOR) 20 MG tablet Take 20 mg by mouth daily.     . benzonatate (TESSALON) 200 MG capsule Take one cap 2-3 times a day for cough 20 capsule 0  . cephALEXin (KEFLEX) 500 MG  capsule Take 1 capsule (500 mg total) by mouth 4 (four) times daily. (Patient not taking: Reported on 02/13/2020) 20 capsule 0  . cholecalciferol (VITAMIN D3) 25 MCG (1000 UNIT) tablet Take 1,000 Units by mouth daily.    . folic acid (FOLVITE) 1 MG tablet Take 1 tablet (1 mg total) by mouth daily. 30 tablet 2  . levothyroxine (SYNTHROID) 50 MCG tablet Take 1 tablet (50 mcg total) by mouth daily  before breakfast. 30 tablet 2  . metFORMIN (GLUCOPHAGE) 500 MG tablet Take 500 mg by mouth daily with breakfast.     . Multiple Vitamin (MULTIVITAMIN) tablet Take 1 tablet by mouth daily.    . prochlorperazine (COMPAZINE) 10 MG tablet Take 1 tablet (10 mg total) by mouth every 6 (six) hours as needed. (Patient taking differently: Take 10 mg by mouth every 6 (six) hours as needed for nausea or vomiting. ) 30 tablet 2  . Propylene Glycol (SYSTANE BALANCE OP) Apply 1 drop to eye daily as needed (eye irritation).    Marland Kitchen Respiratory Therapy Supplies (FLUTTER) DEVI Please use up 10 times daily(4-5 breaths, 4-5 times daily) 1 each 0  . rivaroxaban (XARELTO) 20 MG TABS tablet Take 1 tablet (20 mg total) by mouth daily with supper. 30 tablet 2  . TRELEGY ELLIPTA 100-62.5-25 MCG/INH AEPB Inhale 1 puff into the lungs daily.    Marland Kitchen triamcinolone lotion (KENALOG) 0.1 % Apply 1 application topically 4 (four) times daily. (Patient taking differently: Apply 1 application topically 2 (two) times daily as needed (irritation). ) 120 mL 2  . venlafaxine (EFFEXOR) 37.5 MG tablet Take 37.5 mg by mouth daily.      No current facility-administered medications for this visit.    SURGICAL HISTORY:  Past Surgical History:  Procedure Laterality Date  . LEG SURGERY  age 61    left leg, from fracture    REVIEW OF SYSTEMS:   Review of Systems  Constitutional: Negative for appetite change, chills, fatigue, fever and unexpected weight change.  HENT:   Negative for mouth sores, nosebleeds, sore throat and trouble swallowing.   Eyes: Negative for eye problems and icterus.  Respiratory: Negative for cough, hemoptysis, shortness of breath and wheezing.   Cardiovascular: Negative for chest pain and leg swelling.  Gastrointestinal: Negative for abdominal pain, constipation, diarrhea, nausea and vomiting.  Genitourinary: Negative for bladder incontinence, difficulty urinating, dysuria, frequency and hematuria.   Musculoskeletal:  Negative for back pain, gait problem, neck pain and neck stiffness.  Skin: Negative for itching and rash.  Neurological: Negative for dizziness, extremity weakness, gait problem, headaches, light-headedness and seizures.  Hematological: Negative for adenopathy. Does not bruise/bleed easily.  Psychiatric/Behavioral: Negative for confusion, depression and sleep disturbance. The patient is not nervous/anxious.     PHYSICAL EXAMINATION:  There were no vitals taken for this visit.  ECOG PERFORMANCE STATUS: {CHL ONC ECOG [id number]  Physical Exam  Constitutional: Oriented to person, place, and time and well-developed, well-nourished, and in no distress. No distress.  HENT:  Head: Normocephalic and atraumatic.  Mouth/Throat: Oropharynx is clear and moist. No oropharyngeal exudate.  Eyes: Conjunctivae are normal. Right eye exhibits no discharge. Left eye exhibits no discharge. No scleral icterus.  Neck: Normal range of motion. Neck supple.  Cardiovascular: Normal rate, regular rhythm, normal heart sounds and intact distal pulses.   Pulmonary/Chest: Effort normal and breath sounds normal. No respiratory distress. No wheezes. No rales.  Abdominal: Soft. Bowel sounds are normal. Exhibits no distension and no mass. There is no tenderness.  Musculoskeletal: Normal range of motion. Exhibits no edema.  Lymphadenopathy:    No cervical adenopathy.  Neurological: Alert and oriented to person, place, and time. Exhibits normal muscle tone. Gait normal. Coordination normal.  Skin: Skin is warm and dry. No rash noted. Not diaphoretic. No erythema. No pallor.  Psychiatric: Mood, memory and judgment normal.  Vitals reviewed.  LABORATORY DATA: Lab Results  Component Value Date   WBC 17.7 (H) 02/14/2020   HGB 15.0 02/14/2020   HCT 44.9 02/14/2020   MCV 87.7 02/14/2020   PLT 145 (L) 02/14/2020      Chemistry      Component Value Date/Time   NA 137 02/14/2020 1006   K 4.1 02/14/2020 1006   CL  106 02/14/2020 1006   CO2 21 (L) 02/14/2020 1006   BUN 17 02/14/2020 1006   CREATININE 0.78 02/14/2020 1006      Component Value Date/Time   CALCIUM 8.5 (L) 02/14/2020 1006   ALKPHOS 103 02/14/2020 1006   AST 26 02/14/2020 1006   ALT 40 02/14/2020 1006   BILITOT 0.5 02/14/2020 1006       RADIOGRAPHIC STUDIES:  DG Chest 2 View  Result Date: 02/18/2020 CLINICAL DATA:  Cancer, cough, shortness of breath EXAM: CHEST - 2 VIEW COMPARISON:  PET-CT 02/02/2020, radiograph 11/26/2019, CT chest 01/22/2020 FINDINGS: Dense bandlike opacity, likely scarring or subsegmental atelectasis extending from the previously seen right hilar/upper lobe mass with more diffuse interstitial thickening and reticular nodularity throughout the right lung compatible lymphangitis carcinomatosis. The left lung remains largely clear. Small right pleural effusion. No left effusion. No pneumothorax. Stable cardiomediastinal contours. No acute osseous or soft tissue abnormality. Degenerative changes are present in the imaged spine and shoulders. IMPRESSION: 1. Redemonstrated right hilar/upper lobe mass with more diffuse interstitial thickening and reticular nodularity throughout the right lung compatible with lymphangitis carcinomatosis. 2. Bandlike opacity extending towards the lung periphery from the hilar lesion likely reflect some and or subsegmental atelectasis. 3. Small right pleural effusion. Electronically Signed   By: Lovena Le M.D.   On: 02/18/2020 15:35   CT Soft Tissue Neck W Contrast  Result Date: 01/22/2020 CLINICAL DATA:  Lung cancer, history of lymph nodes in the neck EXAM: CT NECK WITH CONTRAST TECHNIQUE: Multidetector CT imaging of the neck was performed using the standard protocol following the bolus administration of intravenous contrast. CONTRAST:  30m OMNIPAQUE IOHEXOL 300 MG/ML  SOLN COMPARISON:  11/20/2019 FINDINGS: Pharynx and larynx: Unremarkable with no mass or swelling. Salivary glands:  Unremarkable. Thyroid: Normal. Lymph nodes: There are no pathologically enlarged or necrotic lymph nodes identified. Nonspecific slight increase in size of some left level 2 nodes (series 2, image 55) and left supraclavicular nodes (series 2, image 83-92). Vascular: Major neck vessels are patent. Limited intracranial: No abnormal enhancement. Visualized orbits: Unremarkable Mastoids and visualized paranasal sinuses: Aerated. Skeleton: Cervical spine degenerative changes, greatest at C5-C6. Upper chest: See separately dictated CT chest. Other: None. IMPRESSION: No pathologically enlarged lymph nodes. Nonspecific slight increase in size of some left level 2 and left supraclavicular nodes for which attention on follow-up is recommended. Electronically Signed   By: PMacy MisM.D.   On: 01/22/2020 20:49   CT Chest W Contrast  Result Date: 01/22/2020 CLINICAL DATA:  75 ml omni 300 Pt.states hx of Lung ca dx'd 2020 with mets to the lymph nodes and neck, immunotherapy complete 04/2019, xrt to the lung and neck complete, chemo complete EXAM: CT CHEST WITH CONTRAST TECHNIQUE: Multidetector CT imaging of the  chest was performed during intravenous contrast administration. CONTRAST:  92m OMNIPAQUE IOHEXOL 300 MG/ML  SOLN COMPARISON:  None. FINDINGS: Cardiovascular: No significant vascular findings. Normal heart size. No pericardial effusion. Mediastinum/Nodes: No axillary or supraclavicular adenopathy. No mediastinal hilar adenopathy. No pericardial effusion. Mild thickening the esophagus. Lungs/Pleura: Perihilar consolidation with air bronchograms in the RIGHT lung are similar comparison exam. However, there are newly enlarged nodules within the superior segment of the RIGHT lower lobe and RIGHT upper lobe. For example 16 mm nodule on image 78/6 is increased from 8 mm. Adjacent 14 mm nodule on image 76/6 is not clearly seen. More superiorly in the RIGHT lower lobe 17 mm nodule on CT image 69 compared to 21 mm. IN the  the upper lobe, 13 mm nodule on image 54/6 increased from 6 mm. No suspicious nodules in the LEFT lung. Upper Abdomen: Limited view of the liver, kidneys, pancreas are unremarkable. Normal adrenal glands. Retroperitoneal nodule/node adjacent the crus of the diaphragm and LEFT adrenal gland measures 11 mm (image 55/3 compared to 7 mm. Musculoskeletal: No aggressive osseous lesion. IMPRESSION: 1. Interval increase in size of multiple RIGHT lung pulmonary nodules highly concerning for lung cancer recurrence. Pulmonary infection is less favored. 2. Stable perihilar consolidation in the RIGHT lung. 3. Interval increase in size of small retroperitoneal lymph node adjacent to the crus of the diaphragm and LEFT adrenal gland. Concerning for upper abdominal nodal metastasis. 4. Consider FDG PET imaging to evaluate extent of suspected lung cancer recurrence. Electronically Signed   By: SSuzy BouchardM.D.   On: 01/22/2020 13:45   NM PET Image Restag (PS) Skull Base To Thigh  Result Date: 02/02/2020 CLINICAL DATA:  Subsequent treatment strategy for lung cancer. Primary Cancer Type: Lung Imaging Indication: Restaging for new clinical signs or symptoms Interval therapy since last imaging? No Initial Cancer Diagnosis Date: 08/23/2018; Established by: Biopsy-proven Detailed Pathology: Stage IIIB non-small cell lung cancer, adenocarcinoma. Primary Tumor location: Right upper lobe mass, mediastinal and bilateral supraclavicular lymphadenopathy. Recurrence? Yes; Date(s) of recurrence: 01/22/2020; Established by: Imaging only Chemotherapy: Yes; Ongoing? No; Most recent administration: 10/10/2018 Immunotherapy?  Yes; Type: Imfinzi; Ongoing? No Radiation therapy? Yes; Date Range: 03/02/2019-04/13/2019; Target: Right neck EXAM: NUCLEAR MEDICINE PET SKULL BASE TO THIGH TECHNIQUE: 8.83 mCi F-18 FDG was injected intravenously. Full-ring PET imaging was performed from the skull base to thigh after the radiotracer. CT data was obtained  and used for attenuation correction and anatomic localization. Fasting blood glucose: 104 mg/dl COMPARISON:  Most recent CT chest 01/22/2020,  08/11/2018 PET-CT. FINDINGS: Mediastinal blood pool activity: SUV max 2.6 Liver activity: SUV max 3.5 NECK: Interval decrease in size and metabolic activity of supraclavicular lymph nodes. One small LEFT level 3 lymph node SUV max equal 8.2 is new from prior (image 43). Incidental CT findings: none CHEST: RIGHT lower paratracheal adenopathy has resolved. Subcarinal lymph node SUV max equal 7.0 increased from 3.9. Interval resolution in RIGHT hilar hypermetabolic adenopathy Within the RIGHT lung previously seen hypermetabolic nodule resolved. However there is peribronchial thickening with nodularity in the central bronchovascular structures about the RIGHT hilum and RIGHT lower lobe with SUV max equal 4.7. Nodules measure up to 12 mm (image 79/4) There multiple new hypermetabolic RIGHT axillary lymph nodes with SUV max equal 7.8. Nodes are small. Incidental CT findings: none ABDOMEN/PELVIS: New hypermetabolic lymph node at the crus of the diaphragm with SUV max equal 8.5. This node is small measuring 9 mm in the gastrohepatic ligament. No abnormal activity liver. NO hypermetabolic  lymph nodes elsewhere in the abdomen pelvis. Adrenal glands normal Incidental CT findings: none SKELETON: No focal hypermetabolic activity to suggest skeletal metastasis. Incidental CT findings: none IMPRESSION: 1. Mixed response to therapy. 2. Resolution large hypermetabolic nodule in the RIGHT lower lobe as well as resolution hypermetabolic RIGHT hilar, paratracheal adenopathy and supraclavicular adenopathy. 3. However, there is new peribronchial thickening in the RIGHT hilar lung with nodularity and moderate metabolic activity. Differential would include lymphangitic spread of carcinoma, lung cancer recurrence, or drug/immune related response. The metabolic activity is much less than the original  tumor activity however the CT findings are concerning. 4. Several new small hypermetabolic lymph nodes including a subcarinal lymph node, level III LEFT neck lymph node, and a lymph node at the crus of the diaphragm in thethe upper abdomen. These all nodes are very small but intensely hypermetabolic. 5. Multiple new hypermetabolic small RIGHT axillary lymph nodes also concerning for metastasis although COVID vaccination could elicit this response (vaccination in April unclear which arm). Electronically Signed   By: Suzy Bouchard M.D.   On: 02/02/2020 11:00   [place] CORE BIOPSY (LYMPH NODES)  Result Date: 02/16/2020 INDICATION: History of lung cancer, now with hypermetabolic bilateral cervical lymphadenopathy. Please perform ultrasound-guided biopsy of a hypermetabolic left cervical lymph node for tissue diagnostic purposes. EXAM: ULTRASOUND-GUIDED LEFT SUPRACLAVICULAR LYMPH NODE BIOPSY COMPARISON:  PET-CT - 02/02/2020 MEDICATIONS: None ANESTHESIA/SEDATION: Moderate (conscious) sedation was employed during this procedure. A total of Versed 1 mg and Fentanyl 50 mcg was administered intravenously. Moderate Sedation Time: 10 minutes. The patient's level of consciousness and vital signs were monitored continuously by radiology nursing throughout the procedure under my direct supervision. COMPLICATIONS: None immediate. TECHNIQUE: Informed written consent was obtained from the patient after a discussion of the risks, benefits and alternatives to treatment. Questions regarding the procedure were encouraged and answered. Initial ultrasound scanning demonstrated and approximately 1.5 x 0.8 cm left supraclavicular lymph node (image 2) correlating with the hypermetabolic cervical lymph node seen on preceding PET-CT image 42, series 4). This lymph node was targeted for biopsy given location and sonographic window. An ultrasound image was saved for documentation purposes. The procedure was planned. A timeout was performed  prior to the initiation of the procedure. The operative was prepped and draped in the usual sterile fashion, and a sterile drape was applied covering the operative field. A timeout was performed prior to the initiation of the procedure. Local anesthesia was provided with 1% lidocaine with epinephrine. Under direct ultrasound guidance, an 18 gauge core needle device was utilized to obtain to obtain 6 core needle biopsies of the hypermetabolic left cervical lymph node. The samples were placed in saline and submitted to pathology. The needle was removed and hemostasis was achieved with manual compression. Post procedure scan was negative for significant hematoma. A dressing was placed. The patient tolerated the procedure well without immediate postprocedural complication. IMPRESSION: Technically successful ultrasound guided biopsy of dominant hypermetabolic left cervical lymph node. Electronically Signed   By: Sandi Mariscal M.D.   On: 02/16/2020 14:28     ASSESSMENT/PLAN:  This is a very pleasant 59 year old Caucasian male with recurrent lung cancer initially diagnosed with stage IIIb non-small cell lung cancer, adenocarcinoma.He presented with a right upper lobe lung mass in addition to mediastinal and bilateral supraclavicular lymphadenopathy. He was diagnosed in January 2020. His PDL 1 expression is 10% and he has no actionable mutations.   He underwent a course of concurrent chemoradiation with weekly carboplatin and paclitaxel. He is status  post 6 cycles. He tolerated well except for fatigue and odynophagia.   Hecompleted his 26 cycles ofconsolidation immunotherapy with Imfinzi 10 mg/kg IV every 2 weeks. He tolerated it well without any adverse side effectsexcept has a very mild skin rash over his right cervical area.Hecompletedradiation to the enlarging right cervical lymph nodes.His last radiation treatmentwas on9/05/2019.  The patient showed evidence of disease progression in June  2021.  He recently had a biopsy of a left cervical lymph node which was positive for his known non-small cell lung cancer, adenocarcinoma.  Therefore, the patient is currently undergoing systemic chemotherapy with carboplatin for an AUC of 5, Alimta 500 mg per metered squared, Keytruda 200 mg IV every 3 weeks.  He is status post 1 cycle and tolerated it __without any adverse side effects.  The patient was seen with Dr. Julien Nordmann today.  Labs were reviewed.  Dr. Julien Nordmann recommends__.   We will continue using Tessalon and Delsym for his cough.  Follow-up on dermatology referral.  Patient had a brain MRI earlier today.  The results are still pending.  The patient will follow-up in 2 weeks for evaluation before starting cycle #2.  The patient was advised to call immediately if he has any concerning symptoms in the interval. The patient voices understanding of current disease status and treatment options and is in agreement with the current care plan. All questions were answered. The patient knows to call the clinic with any problems, questions or concerns. We can certainly see the patient much sooner if necessary  No orders of the defined types were placed in this encounter.    Ezinne Yogi L Dejon Jungman, PA-C 02/20/20

## 2020-02-20 NOTE — Telephone Encounter (Signed)
Faxed [employer] , demographics and insurance to [facility].

## 2020-02-21 ENCOUNTER — Telehealth: Payer: Self-pay | Admitting: *Deleted

## 2020-02-21 ENCOUNTER — Ambulatory Visit (HOSPITAL_COMMUNITY)
Admission: RE | Admit: 2020-02-21 | Discharge: 2020-02-21 | Disposition: A | Discharge status: Home / Self Care | Payer: BC Managed Care – PPO | Source: Ambulatory Visit | Attending: Radiation Oncology | Admitting: Radiation Oncology

## 2020-02-21 ENCOUNTER — Encounter: Payer: Self-pay | Admitting: Physician Assistant

## 2020-02-21 ENCOUNTER — Inpatient Hospital Stay: Payer: BC Managed Care – PPO

## 2020-02-21 ENCOUNTER — Ambulatory Visit: Payer: BC Managed Care – PPO | Admitting: Physician Assistant

## 2020-02-21 ENCOUNTER — Other Ambulatory Visit: Payer: Self-pay

## 2020-02-21 ENCOUNTER — Inpatient Hospital Stay (HOSPITAL_BASED_OUTPATIENT_CLINIC_OR_DEPARTMENT_OTHER): Payer: BC Managed Care – PPO | Admitting: Physician Assistant

## 2020-02-21 ENCOUNTER — Inpatient Hospital Stay: Payer: BC Managed Care – PPO | Admitting: Physician Assistant

## 2020-02-21 VITALS — BP 139/90 | HR 118 | Temp 99.0°F | Resp 17 | Ht 68.0 in | Wt 175.5 lb

## 2020-02-21 DIAGNOSIS — Z7901 Long term (current) use of anticoagulants: Secondary | ICD-10-CM | POA: Diagnosis not present

## 2020-02-21 DIAGNOSIS — Z86711 Personal history of pulmonary embolism: Secondary | ICD-10-CM | POA: Diagnosis not present

## 2020-02-21 DIAGNOSIS — C3491 Malignant neoplasm of unspecified part of right bronchus or lung: Secondary | ICD-10-CM

## 2020-02-21 DIAGNOSIS — Z923 Personal history of irradiation: Secondary | ICD-10-CM | POA: Diagnosis not present

## 2020-02-21 DIAGNOSIS — R1310 Dysphagia, unspecified: Secondary | ICD-10-CM | POA: Diagnosis not present

## 2020-02-21 DIAGNOSIS — M7989 Other specified soft tissue disorders: Secondary | ICD-10-CM | POA: Diagnosis not present

## 2020-02-21 DIAGNOSIS — I10 Essential (primary) hypertension: Secondary | ICD-10-CM | POA: Diagnosis not present

## 2020-02-21 DIAGNOSIS — C7931 Secondary malignant neoplasm of brain: Secondary | ICD-10-CM | POA: Diagnosis not present

## 2020-02-21 DIAGNOSIS — R9082 White matter disease, unspecified: Secondary | ICD-10-CM | POA: Diagnosis not present

## 2020-02-21 DIAGNOSIS — Z5112 Encounter for antineoplastic immunotherapy: Secondary | ICD-10-CM | POA: Diagnosis not present

## 2020-02-21 DIAGNOSIS — C3411 Malignant neoplasm of upper lobe, right bronchus or lung: Secondary | ICD-10-CM | POA: Diagnosis not present

## 2020-02-21 DIAGNOSIS — Z5111 Encounter for antineoplastic chemotherapy: Secondary | ICD-10-CM | POA: Diagnosis not present

## 2020-02-21 DIAGNOSIS — Z7952 Long term (current) use of systemic steroids: Secondary | ICD-10-CM | POA: Diagnosis not present

## 2020-02-21 DIAGNOSIS — E119 Type 2 diabetes mellitus without complications: Secondary | ICD-10-CM | POA: Diagnosis not present

## 2020-02-21 DIAGNOSIS — Z7984 Long term (current) use of oral hypoglycemic drugs: Secondary | ICD-10-CM | POA: Diagnosis not present

## 2020-02-21 DIAGNOSIS — Z79899 Other long term (current) drug therapy: Secondary | ICD-10-CM | POA: Diagnosis not present

## 2020-02-21 DIAGNOSIS — C770 Secondary and unspecified malignant neoplasm of lymph nodes of head, face and neck: Secondary | ICD-10-CM | POA: Diagnosis not present

## 2020-02-21 DIAGNOSIS — J45909 Unspecified asthma, uncomplicated: Secondary | ICD-10-CM | POA: Diagnosis not present

## 2020-02-21 LAB — CBC WITH DIFFERENTIAL (CANCER CENTER ONLY)
Abs Immature Granulocytes: 0.2 10*3/uL — ABNORMAL HIGH (ref 0.00–0.07)
Basophils Absolute: 0 10*3/uL (ref 0.0–0.1)
Basophils Relative: 1 %
Eosinophils Absolute: 0.2 10*3/uL (ref 0.0–0.5)
Eosinophils Relative: 3 %
HCT: 43.4 % (ref 39.0–52.0)
Hemoglobin: 14.5 g/dL (ref 13.0–17.0)
Immature Granulocytes: 2 %
Lymphocytes Relative: 5 %
Lymphs Abs: 0.4 10*3/uL — ABNORMAL LOW (ref 0.7–4.0)
MCH: 30 pg (ref 26.0–34.0)
MCHC: 33.4 g/dL (ref 30.0–36.0)
MCV: 89.9 fL (ref 80.0–100.0)
Monocytes Absolute: 1 10*3/uL (ref 0.1–1.0)
Monocytes Relative: 11 %
Neutro Abs: 6.9 10*3/uL (ref 1.7–7.7)
Neutrophils Relative %: 78 %
Platelet Count: 85 10*3/uL — ABNORMAL LOW (ref 150–400)
RBC: 4.83 MIL/uL (ref 4.22–5.81)
RDW: 13.5 % (ref 11.5–15.5)
WBC Count: 8.7 10*3/uL (ref 4.0–10.5)
nRBC: 0 % (ref 0.0–0.2)

## 2020-02-21 LAB — CMP (CANCER CENTER ONLY)
ALT: 34 U/L (ref 0–44)
AST: 23 U/L (ref 15–41)
Albumin: 3.1 g/dL — ABNORMAL LOW (ref 3.5–5.0)
Alkaline Phosphatase: 133 U/L — ABNORMAL HIGH (ref 38–126)
Anion gap: 12 (ref 5–15)
BUN: 17 mg/dL (ref 6–20)
CO2: 20 mmol/L — ABNORMAL LOW (ref 22–32)
Calcium: 9.2 mg/dL (ref 8.9–10.3)
Chloride: 105 mmol/L (ref 98–111)
Creatinine: 0.93 mg/dL (ref 0.61–1.24)
GFR, Est AFR Am: >60 mL/min (ref >60)
GFR, Estimated: >60 mL/min (ref >60)
Glucose, Bld: 132 mg/dL — ABNORMAL HIGH (ref 70–99)
Potassium: 3.6 mmol/L (ref 3.5–5.1)
Sodium: 137 mmol/L (ref 135–145)
Total Bilirubin: 0.3 mg/dL (ref 0.3–1.2)
Total Protein: 6.4 g/dL — ABNORMAL LOW (ref 6.5–8.1)

## 2020-02-21 MED ORDER — GADOBUTROL 1 MMOL/ML IV SOLN
8.0000 mL | Freq: Once | INTRAVENOUS | Status: AC | PRN
  Administered 2020-02-21: 8 mL via INTRAVENOUS

## 2020-02-21 NOTE — Telephone Encounter (Signed)
Referral called to [facility]
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
## 2020-02-21 NOTE — Telephone Encounter (Signed)
Faxed referral form to [facility]. Unable to reach via phone.

## 2020-02-22 ENCOUNTER — Encounter (HOSPITAL_BASED_OUTPATIENT_CLINIC_OR_DEPARTMENT_OTHER): Payer: Self-pay

## 2020-02-22 ENCOUNTER — Ambulatory Visit (HOSPITAL_BASED_OUTPATIENT_CLINIC_OR_DEPARTMENT_OTHER)
Admission: RE | Admit: 2020-02-22 | Discharge: 2020-02-22 | Disposition: A | Discharge status: Home / Self Care | Payer: BC Managed Care – PPO | Source: Ambulatory Visit | Attending: Physician Assistant | Admitting: Physician Assistant

## 2020-02-22 ENCOUNTER — Telehealth: Payer: Self-pay | Admitting: Physician Assistant

## 2020-02-22 ENCOUNTER — Other Ambulatory Visit: Payer: Self-pay | Admitting: Internal Medicine

## 2020-02-22 ENCOUNTER — Ambulatory Visit (HOSPITAL_COMMUNITY)
Admission: RE | Admit: 2020-02-22 | Discharge: 2020-02-22 | Disposition: A | Discharge status: Home / Self Care | Payer: BC Managed Care – PPO | Source: Ambulatory Visit | Attending: Physician Assistant | Admitting: Physician Assistant

## 2020-02-22 ENCOUNTER — Other Ambulatory Visit: Payer: Self-pay | Admitting: Physician Assistant

## 2020-02-22 ENCOUNTER — Telehealth: Payer: Self-pay | Admitting: Medical Oncology

## 2020-02-22 DIAGNOSIS — R079 Chest pain, unspecified: Secondary | ICD-10-CM | POA: Diagnosis not present

## 2020-02-22 DIAGNOSIS — J189 Pneumonia, unspecified organism: Secondary | ICD-10-CM | POA: Diagnosis not present

## 2020-02-22 DIAGNOSIS — R590 Localized enlarged lymph nodes: Secondary | ICD-10-CM | POA: Diagnosis not present

## 2020-02-22 DIAGNOSIS — R918 Other nonspecific abnormal finding of lung field: Secondary | ICD-10-CM | POA: Diagnosis not present

## 2020-02-22 DIAGNOSIS — C3491 Malignant neoplasm of unspecified part of right bronchus or lung: Secondary | ICD-10-CM

## 2020-02-22 DIAGNOSIS — J8410 Pulmonary fibrosis, unspecified: Secondary | ICD-10-CM | POA: Diagnosis not present

## 2020-02-22 MED ORDER — ENOXAPARIN SODIUM 120 MG/0.8ML ~~LOC~~ SOLN: 120.0000 mg | SUBCUTANEOUS | 0 refills | Status: DC

## 2020-02-22 MED ORDER — METHYLPREDNISOLONE 4 MG PO TBPK
ORAL_TABLET | ORAL | 0 refills | Status: DC
Start: 2020-02-22 — End: 2020-03-05

## 2020-02-22 MED ORDER — IOHEXOL 350 MG/ML SOLN
100.0000 mL | Freq: Once | INTRAVENOUS | Status: AC | PRN
  Administered 2020-02-22: 100 mL via INTRAVENOUS

## 2020-02-22 NOTE — Telephone Encounter (Signed)
I reviewed the patients CT scan and lower extremity doppler ultrasound with Dr. Julien Nordmann. Dr. Julien Nordmann and I called the patient and discussed the results. The patient is currently on Xarelto. His doppler from today showed evidence of acute bilateral DVTs. He did not have any evidence of pulmonary embolism on CT angio today. Dr. Julien Nordmann gave the patient the option of IVC filter placement and changing anti-coagulant to a different oral anti-coagulant such as eliquis vs switching anti-coagulant to lovenox vs. Switching to lovenox as well as consideration of IVC filter. The patient is interested in proceeding with IVC filter placement as well as switching to lovenox. The orders have been placed.   Regarding the findings on the patients CT angiogram of the chest today, he has evidence of a pleural effusion. The patient is still experiencing a significant cough without significant relief from his cough medications. We will send a prescription for a medrol dosepak to his pharmacy. Dr. Julien Nordmann discussed that this may cause hyperglycemia and to monitor his blood glucose closely. We will also arrange for the patient to have a therapeutic and diagnostic thoracentesis.   Discussed the risk of embolism due to the bilateral DVTs. Reinforced that if the patient develops new or worsening symptoms such as shortness of breath, chest pain, tachycardia, hypoxia, hemoptysis, dizziness, etc to seek emergency evaluation.

## 2020-02-22 NOTE — Progress Notes (Signed)
Bilateral lower extremity venous duplex completed. Refer to "CV Proc" under chart review to view preliminary results.  Preliminary results discussed with Dr. Julien Nordmann.  02/22/2020 10:55 AM Kelby Aline., MHA, RVT, RDCS, RDMS

## 2020-02-22 NOTE — Telephone Encounter (Signed)
His VM is not set up . He has address for [facility] as [address]  to get CT scan.

## 2020-02-23 ENCOUNTER — Telehealth: Payer: Self-pay | Admitting: Radiation Therapy

## 2020-02-23 ENCOUNTER — Other Ambulatory Visit: Payer: Self-pay | Admitting: Radiation Therapy

## 2020-02-23 ENCOUNTER — Telehealth: Payer: Self-pay

## 2020-02-23 DIAGNOSIS — C7931 Secondary malignant neoplasm of brain: Secondary | ICD-10-CM

## 2020-02-23 DIAGNOSIS — C7949 Secondary malignant neoplasm of other parts of nervous system: Secondary | ICD-10-CM

## 2020-02-23 NOTE — Telephone Encounter (Signed)
I called [name] per Dr. Clabe Seal request to review the recent brain MRI results and to discuss offering stereotactic radiosurgery as a treatment option. [name] is very interested in moving forward with a more detailed 3T MRI for planning and to visit with Dr. Isidore [name] to talk more in detail about the pros and cons of SRS vs whole brain XRT. He was very happy to hear that we already have the brain MRI and consult with Dr. Isidore [name] set up for him. I told him about the neurosurgeon participation in this type of focused treatment and that I am still waiting to hear back to see which neurosurgeon is available to work with [place] for his case. Once I have the neurosurgeon consult scheduled and the treatment set up, I will give him a call back. In the interim, [name] has my contact information and knows that he can call me with questions or concerns regarding this process.   [name] was very happy for the call and to know that we are moving forward on his behalf.    7/30 - 3T Planning MRI   8/3 - Rad Onc Consult (Dr. Isidore [name]) and SIM - diabetic, pt has been instructed to hold Metformin the day of sim and not to resume until after labs verified   Mont Dutton R.T.(R)(T) Radiation Special Procedures Navigator

## 2020-02-23 NOTE — Telephone Encounter (Signed)
Patient called and RN left message that Dr. Sondra Come is out of the office today and will have him call him on 02/26/20 to discuss his MRI results. ML with RN at Dr. Lew Dawes office to this effect.

## 2020-02-24 ENCOUNTER — Encounter: Payer: Self-pay | Admitting: Internal Medicine

## 2020-02-25 ENCOUNTER — Encounter: Payer: Self-pay | Admitting: Internal Medicine

## 2020-02-26 ENCOUNTER — Telehealth: Payer: Self-pay | Admitting: *Deleted

## 2020-02-26 ENCOUNTER — Other Ambulatory Visit: Payer: Self-pay | Admitting: Physician Assistant

## 2020-02-26 ENCOUNTER — Other Ambulatory Visit: Payer: Self-pay | Admitting: Radiation Therapy

## 2020-02-26 ENCOUNTER — Other Ambulatory Visit (HOSPITAL_COMMUNITY)
Admission: RE | Admit: 2020-02-26 | Discharge: 2020-02-26 | Disposition: A | Discharge status: Home / Self Care | Payer: BC Managed Care – PPO | Source: Ambulatory Visit | Attending: Internal Medicine | Admitting: Internal Medicine

## 2020-02-26 DIAGNOSIS — Z20822 Contact with and (suspected) exposure to covid-19: Secondary | ICD-10-CM | POA: Diagnosis not present

## 2020-02-26 DIAGNOSIS — C3491 Malignant neoplasm of unspecified part of right bronchus or lung: Secondary | ICD-10-CM

## 2020-02-26 DIAGNOSIS — Z01812 Encounter for preprocedural laboratory examination: Secondary | ICD-10-CM | POA: Diagnosis not present

## 2020-02-26 LAB — SARS CORONAVIRUS 2 (TAT 6-24 HRS): SARS Coronavirus 2: NEGATIVE

## 2020-02-26 MED ORDER — BENZONATATE 200 MG PO CAPS: ORAL_CAPSULE | ORAL | 2 refills | Status: DC

## 2020-02-26 NOTE — Telephone Encounter (Signed)
Patient made aware of advice to proceed with appointment with [place] and adjust schedule for thoracentesis slightly.  Both are important but rescheduling of thoracentesis might be more feasible.  He stated he had already had the COVID screening test done so he is awaiting a call from [facility] to see if they can get him scheduled at a different time but still urgently.

## 2020-02-28 ENCOUNTER — Other Ambulatory Visit: Payer: BC Managed Care – PPO

## 2020-02-28 ENCOUNTER — Inpatient Hospital Stay: Payer: BC Managed Care – PPO

## 2020-02-28 ENCOUNTER — Other Ambulatory Visit: Payer: Self-pay

## 2020-02-28 ENCOUNTER — Telehealth: Payer: Self-pay | Admitting: *Deleted

## 2020-02-28 DIAGNOSIS — Z7984 Long term (current) use of oral hypoglycemic drugs: Secondary | ICD-10-CM | POA: Diagnosis not present

## 2020-02-28 DIAGNOSIS — Z79899 Other long term (current) drug therapy: Secondary | ICD-10-CM | POA: Diagnosis not present

## 2020-02-28 DIAGNOSIS — Z7901 Long term (current) use of anticoagulants: Secondary | ICD-10-CM | POA: Diagnosis not present

## 2020-02-28 DIAGNOSIS — J45909 Unspecified asthma, uncomplicated: Secondary | ICD-10-CM | POA: Diagnosis not present

## 2020-02-28 DIAGNOSIS — Z5112 Encounter for antineoplastic immunotherapy: Secondary | ICD-10-CM | POA: Diagnosis not present

## 2020-02-28 DIAGNOSIS — I10 Essential (primary) hypertension: Secondary | ICD-10-CM | POA: Diagnosis not present

## 2020-02-28 DIAGNOSIS — C3491 Malignant neoplasm of unspecified part of right bronchus or lung: Secondary | ICD-10-CM

## 2020-02-28 DIAGNOSIS — C770 Secondary and unspecified malignant neoplasm of lymph nodes of head, face and neck: Secondary | ICD-10-CM | POA: Diagnosis not present

## 2020-02-28 DIAGNOSIS — Z923 Personal history of irradiation: Secondary | ICD-10-CM | POA: Diagnosis not present

## 2020-02-28 DIAGNOSIS — Z5111 Encounter for antineoplastic chemotherapy: Secondary | ICD-10-CM | POA: Diagnosis not present

## 2020-02-28 DIAGNOSIS — R1310 Dysphagia, unspecified: Secondary | ICD-10-CM | POA: Diagnosis not present

## 2020-02-28 DIAGNOSIS — Z7952 Long term (current) use of systemic steroids: Secondary | ICD-10-CM | POA: Diagnosis not present

## 2020-02-28 DIAGNOSIS — C3411 Malignant neoplasm of upper lobe, right bronchus or lung: Secondary | ICD-10-CM | POA: Diagnosis not present

## 2020-02-28 DIAGNOSIS — Z86711 Personal history of pulmonary embolism: Secondary | ICD-10-CM | POA: Diagnosis not present

## 2020-02-28 DIAGNOSIS — E119 Type 2 diabetes mellitus without complications: Secondary | ICD-10-CM | POA: Diagnosis not present

## 2020-02-28 LAB — CBC WITH DIFFERENTIAL (CANCER CENTER ONLY)
Abs Immature Granulocytes: 0.48 10*3/uL — ABNORMAL HIGH (ref 0.00–0.07)
Basophils Absolute: 0.1 10*3/uL (ref 0.0–0.1)
Basophils Relative: 1 %
Eosinophils Absolute: 0.1 10*3/uL (ref 0.0–0.5)
Eosinophils Relative: 1 %
HCT: 41.2 % (ref 39.0–52.0)
Hemoglobin: 13.8 g/dL (ref 13.0–17.0)
Immature Granulocytes: 5 %
Lymphocytes Relative: 5 %
Lymphs Abs: 0.5 10*3/uL — ABNORMAL LOW (ref 0.7–4.0)
MCH: 30.3 pg (ref 26.0–34.0)
MCHC: 33.5 g/dL (ref 30.0–36.0)
MCV: 90.5 fL (ref 80.0–100.0)
Monocytes Absolute: 1.6 10*3/uL — ABNORMAL HIGH (ref 0.1–1.0)
Monocytes Relative: 15 %
Neutro Abs: 7.8 10*3/uL — ABNORMAL HIGH (ref 1.7–7.7)
Neutrophils Relative %: 73 %
Platelet Count: 182 10*3/uL (ref 150–400)
RBC: 4.55 MIL/uL (ref 4.22–5.81)
RDW: 14.3 % (ref 11.5–15.5)
WBC Count: 10.6 10*3/uL — ABNORMAL HIGH (ref 4.0–10.5)
nRBC: 0 % (ref 0.0–0.2)

## 2020-02-28 LAB — CMP (CANCER CENTER ONLY)
ALT: 25 U/L (ref 0–44)
AST: 17 U/L (ref 15–41)
Albumin: 3.2 g/dL — ABNORMAL LOW (ref 3.5–5.0)
Alkaline Phosphatase: 123 U/L (ref 38–126)
Anion gap: 9 (ref 5–15)
BUN: 18 mg/dL (ref 6–20)
CO2: 24 mmol/L (ref 22–32)
Calcium: 9.3 mg/dL (ref 8.9–10.3)
Chloride: 105 mmol/L (ref 98–111)
Creatinine: 0.82 mg/dL (ref 0.61–1.24)
GFR, Est AFR Am: >60 mL/min (ref >60)
GFR, Estimated: >60 mL/min (ref >60)
Glucose, Bld: 88 mg/dL (ref 70–99)
Potassium: 4 mmol/L (ref 3.5–5.1)
Sodium: 138 mmol/L (ref 135–145)
Total Bilirubin: 0.3 mg/dL (ref 0.3–1.2)
Total Protein: 6.1 g/dL — ABNORMAL LOW (ref 6.5–8.1)

## 2020-02-28 NOTE — Telephone Encounter (Signed)
Guardant 360 test results were faxed to Neoma Laming at [facility]

## 2020-02-29 ENCOUNTER — Ambulatory Visit (HOSPITAL_COMMUNITY): Payer: BC Managed Care – PPO

## 2020-02-29 DIAGNOSIS — C3491 Malignant neoplasm of unspecified part of right bronchus or lung: Secondary | ICD-10-CM | POA: Diagnosis not present

## 2020-02-29 DIAGNOSIS — C7931 Secondary malignant neoplasm of brain: Secondary | ICD-10-CM | POA: Diagnosis not present

## 2020-02-29 DIAGNOSIS — C3490 Malignant neoplasm of unspecified part of unspecified bronchus or lung: Secondary | ICD-10-CM | POA: Diagnosis not present

## 2020-03-01 ENCOUNTER — Other Ambulatory Visit: Payer: Self-pay | Admitting: Physician Assistant

## 2020-03-01 ENCOUNTER — Other Ambulatory Visit: Payer: BC Managed Care – PPO

## 2020-03-01 ENCOUNTER — Ambulatory Visit
Admission: RE | Admit: 2020-03-01 | Discharge: 2020-03-01 | Disposition: A | Discharge status: Home / Self Care | Payer: BC Managed Care – PPO | Source: Ambulatory Visit | Attending: Radiation Oncology | Admitting: Radiation Oncology

## 2020-03-01 ENCOUNTER — Other Ambulatory Visit: Payer: Self-pay

## 2020-03-01 ENCOUNTER — Telehealth: Payer: Self-pay | Admitting: *Deleted

## 2020-03-01 DIAGNOSIS — C7949 Secondary malignant neoplasm of other parts of nervous system: Secondary | ICD-10-CM

## 2020-03-01 DIAGNOSIS — C7931 Secondary malignant neoplasm of brain: Secondary | ICD-10-CM | POA: Diagnosis not present

## 2020-03-01 DIAGNOSIS — I6782 Cerebral ischemia: Secondary | ICD-10-CM | POA: Diagnosis not present

## 2020-03-01 DIAGNOSIS — C3491 Malignant neoplasm of unspecified part of right bronchus or lung: Secondary | ICD-10-CM | POA: Diagnosis not present

## 2020-03-01 MED ORDER — GADOBENATE DIMEGLUMINE 529 MG/ML IV SOLN
15.0000 mL | Freq: Once | INTRAVENOUS | Status: AC | PRN
  Administered 2020-03-01: 15 mL via INTRAVENOUS

## 2020-03-01 NOTE — Telephone Encounter (Signed)
Pt called requesting callback. Returned pt call. Was routed to pt vm, advised to callback and if office is closed to call back Monday morning.

## 2020-03-02 ENCOUNTER — Encounter: Payer: Self-pay | Admitting: Internal Medicine

## 2020-03-02 ENCOUNTER — Other Ambulatory Visit: Payer: BC Managed Care – PPO

## 2020-03-04 ENCOUNTER — Other Ambulatory Visit: Payer: Self-pay | Admitting: Internal Medicine

## 2020-03-04 ENCOUNTER — Telehealth: Payer: Self-pay

## 2020-03-04 ENCOUNTER — Ambulatory Visit (HOSPITAL_COMMUNITY)
Admission: RE | Admit: 2020-03-04 | Discharge: 2020-03-04 | Disposition: A | Discharge status: Home / Self Care | Payer: BC Managed Care – PPO | Source: Ambulatory Visit | Attending: Internal Medicine | Admitting: Internal Medicine

## 2020-03-04 ENCOUNTER — Telehealth: Payer: Self-pay | Admitting: Physician Assistant

## 2020-03-04 ENCOUNTER — Encounter (HOSPITAL_COMMUNITY): Payer: Self-pay

## 2020-03-04 ENCOUNTER — Other Ambulatory Visit: Payer: Self-pay

## 2020-03-04 ENCOUNTER — Inpatient Hospital Stay: Payer: BC Managed Care – PPO | Attending: Internal Medicine

## 2020-03-04 ENCOUNTER — Telehealth: Payer: Self-pay | Admitting: Pharmacist

## 2020-03-04 DIAGNOSIS — Z7952 Long term (current) use of systemic steroids: Secondary | ICD-10-CM | POA: Insufficient documentation

## 2020-03-04 DIAGNOSIS — J45909 Unspecified asthma, uncomplicated: Secondary | ICD-10-CM | POA: Insufficient documentation

## 2020-03-04 DIAGNOSIS — Z9221 Personal history of antineoplastic chemotherapy: Secondary | ICD-10-CM | POA: Insufficient documentation

## 2020-03-04 DIAGNOSIS — I82403 Acute embolism and thrombosis of unspecified deep veins of lower extremity, bilateral: Secondary | ICD-10-CM | POA: Insufficient documentation

## 2020-03-04 DIAGNOSIS — Z923 Personal history of irradiation: Secondary | ICD-10-CM | POA: Insufficient documentation

## 2020-03-04 DIAGNOSIS — Z7901 Long term (current) use of anticoagulants: Secondary | ICD-10-CM | POA: Insufficient documentation

## 2020-03-04 DIAGNOSIS — C3491 Malignant neoplasm of unspecified part of right bronchus or lung: Secondary | ICD-10-CM | POA: Insufficient documentation

## 2020-03-04 DIAGNOSIS — Z79890 Hormone replacement therapy (postmenopausal): Secondary | ICD-10-CM | POA: Diagnosis not present

## 2020-03-04 DIAGNOSIS — C7951 Secondary malignant neoplasm of bone: Secondary | ICD-10-CM | POA: Insufficient documentation

## 2020-03-04 DIAGNOSIS — Z79899 Other long term (current) drug therapy: Secondary | ICD-10-CM | POA: Diagnosis not present

## 2020-03-04 DIAGNOSIS — Z20822 Contact with and (suspected) exposure to covid-19: Secondary | ICD-10-CM | POA: Insufficient documentation

## 2020-03-04 DIAGNOSIS — C3411 Malignant neoplasm of upper lobe, right bronchus or lung: Secondary | ICD-10-CM | POA: Insufficient documentation

## 2020-03-04 DIAGNOSIS — E119 Type 2 diabetes mellitus without complications: Secondary | ICD-10-CM | POA: Diagnosis not present

## 2020-03-04 DIAGNOSIS — I10 Essential (primary) hypertension: Secondary | ICD-10-CM | POA: Insufficient documentation

## 2020-03-04 DIAGNOSIS — K219 Gastro-esophageal reflux disease without esophagitis: Secondary | ICD-10-CM | POA: Insufficient documentation

## 2020-03-04 DIAGNOSIS — Z86718 Personal history of other venous thrombosis and embolism: Secondary | ICD-10-CM | POA: Insufficient documentation

## 2020-03-04 DIAGNOSIS — Z7984 Long term (current) use of oral hypoglycemic drugs: Secondary | ICD-10-CM | POA: Insufficient documentation

## 2020-03-04 DIAGNOSIS — C7931 Secondary malignant neoplasm of brain: Secondary | ICD-10-CM | POA: Insufficient documentation

## 2020-03-04 HISTORY — PX: IR IVC FILTER PLMT / S&I /IMG GUID/MOD SED: IMG701

## 2020-03-04 LAB — BASIC METABOLIC PANEL
Anion gap: 12 (ref 5–15)
BUN: 8 mg/dL (ref 6–20)
CO2: 23 mmol/L (ref 22–32)
Calcium: 9 mg/dL (ref 8.9–10.3)
Chloride: 103 mmol/L (ref 98–111)
Creatinine, Ser: 0.82 mg/dL (ref 0.61–1.24)
GFR calc Af Amer: >60 mL/min (ref >60)
GFR calc non Af Amer: >60 mL/min (ref >60)
Glucose, Bld: 106 mg/dL — ABNORMAL HIGH (ref 70–99)
Potassium: 3.8 mmol/L (ref 3.5–5.1)
Sodium: 138 mmol/L (ref 135–145)

## 2020-03-04 LAB — SARS CORONAVIRUS 2 (TAT 6-24 HRS): SARS Coronavirus 2: NEGATIVE

## 2020-03-04 MED ORDER — MIDAZOLAM HCL 2 MG/2ML IJ SOLN
INTRAMUSCULAR | Status: AC | PRN
  Administered 2020-03-04: 1 mg via INTRAVENOUS

## 2020-03-04 MED ORDER — FENTANYL CITRATE (PF) 100 MCG/2ML IJ SOLN
INTRAMUSCULAR | Status: AC | PRN
  Administered 2020-03-04: 50 ug via INTRAVENOUS

## 2020-03-04 MED ORDER — MIDAZOLAM HCL 2 MG/2ML IJ SOLN
INTRAMUSCULAR | Status: AC
  Filled 2020-03-04: qty 2

## 2020-03-04 MED ORDER — LIDOCAINE HCL 1 % IJ SOLN
INTRAMUSCULAR | Status: AC
  Filled 2020-03-04: qty 20

## 2020-03-04 MED ORDER — ALECENSA 150 MG PO CAPS: 600.0000 mg | ORAL_CAPSULE | Freq: Two times a day (BID) | ORAL | 2 refills | Status: DC

## 2020-03-04 MED ORDER — IOHEXOL 300 MG/ML  SOLN
100.0000 mL | Freq: Once | INTRAMUSCULAR | Status: AC | PRN
  Administered 2020-03-04: 40 mL via INTRAVENOUS

## 2020-03-04 MED ORDER — SODIUM CHLORIDE 0.9 % IV SOLN: INTRAVENOUS | Status: DC

## 2020-03-04 MED ORDER — FENTANYL CITRATE (PF) 100 MCG/2ML IJ SOLN
INTRAMUSCULAR | Status: AC
  Filled 2020-03-04: qty 2

## 2020-03-04 NOTE — Telephone Encounter (Signed)
Oral Oncology Pharmacist Encounter  Received new prescription for [name] (alectinib) for the treatment of non-small cell lung cancer, ALK positive, planned duration until disease progression or unacceptable drug toxicity.  Prescription dose and frequency assessed for appropriateness. OK for therapy initiation.   CBC with Diff and CMP from 02/28/20 assessed, no relevant lab abnormalities noted.  Current medication list in Epic reviewed, no relevant DDIs with [name] identified.  Prescription has been e-scribed to the [facility] for benefits analysis and approval.  Oral Oncology Clinic will continue to follow for insurance authorization, copayment issues, initial counseling and start date.  Leron Croak, PharmD, BCPS Hematology/Oncology Clinical Pharmacist [facility] [phone] 03/04/2020 11:06 AM
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
## 2020-03-04 NOTE — Procedures (Signed)
Interventional Radiology Procedure Note  Procedure: IVC FILTER    Complications: None  Estimated Blood Loss:  MIN  Findings: Successful bard denali filter insertion    Tamera Punt, MD

## 2020-03-04 NOTE — Discharge Instructions (Addendum)
Inferior Vena Cava Filter Insertion, Care After This sheet gives you information about how to care for yourself after your procedure. Your health care provider may also give you more specific instructions. If you have problems or questions, contact your health care provider. What can I expect after the procedure? After your procedure, it is common to have:  Mild pain in the area where the filter was inserted.  Mild bruising in the area where the filter was inserted. Follow these instructions at home: Insertion site care   Follow instructions from your health care provider about how to take care of the site where a catheter was inserted at your neck or groin (insertion site). Make sure you: ? Wash your hands with soap and water before you change your bandage (dressing). If soap and water are not available, use hand sanitizer. ? Change your dressing as told by your health care provider.  Check your insertion site every day for signs of infection. Check for: ? More redness, swelling, or pain. ? More fluid or blood. ? Warmth. ? Pus or a bad smell.  Keep the insertion site clean and dry.  Do not shower, bathe, use a hot tub, or let the dressing get wet until your health care provider approves. General instructions  Take over-the-counter and prescription medicines only as told by your health care provider.  Avoid heavy lifting or hard activities for 48 hours after the procedure or as told by your health care provider.  Do not drive for 24 hours if you were given a medicine to help you relax (sedative).  Do not drive or use heavy machinery while taking prescription pain medicine.  Do not go back to school or work until your health care provider approves.  Keep all follow-up visits as told by your health care provider. This is important. Contact a health care provider if:  You have more redness, swelling, or pain around your insertion site.  You have more fluid or blood coming from  your insertion site.  Your insertion site feels warm to the touch.  You have pus or a bad smell coming from your insertion site.  You have a fever.  You are dizzy.  You have nausea and vomiting.  You develop a rash. Get help right away if:  You develop chest pain, a cough, or difficulty breathing.  You develop shortness of breath, feel faint, or pass out.  You cough up blood.  You have severe pain in your abdomen.  You develop swelling and discoloration or pain in your legs.  Your legs become pale and cold or blue.  You develop weakness, difficulty moving your arms or legs, or balance problems.  You develop problems with speech or vision. These symptoms may represent a serious problem that is an emergency. Do not wait to see if the symptoms will go away. Get medical help right away. Call your local emergency services (911 in the [place] Do not drive yourself to the hospital. Summary  After your insertion procedure, it is common to have mild pain and bruising.  Do not shower, bathe, use a hot tub, or let the dressing get wet until your health care provider approves.  Every day, check for signs of infection where a catheter was inserted at your neck or groin (insertion site). This information is not intended to replace advice given to you by your health care provider. Make sure you discuss any questions you have with your health care provider. Document Revised: 07/02/2017 Document Reviewed:  06/10/2016 Elsevier Patient Education  [place].

## 2020-03-04 NOTE — Telephone Encounter (Signed)
I called the patient regarding his mychart message. Told him to keep his appointment as scheduled on 8/4 as we had results to review with him. He expressed understanding.

## 2020-03-04 NOTE — Progress Notes (Signed)
Has armband been applied? Yes   Does patient have an allergy to IV contrast dye?: No   Has patient ever received premedication for IV contrast dye?: n/a  Does patient take metformin?: Yes  If patient does take metformin when was the last dose: 03/04/2020  Date of lab work: 03/05/2020 BUN: 8 CR: 0.82 Egr: >60  IV site: Left AC  Has IV site been added to flowsheet?  Yes  Patient took his metformin this morning.  Spoke with PA, ok to proceed with IV contrast.  Caramia Boutin M. Leonie Green, BSN

## 2020-03-04 NOTE — Telephone Encounter (Signed)
Oral Oncology Patient Advocate Encounter  Received notification from [place] that prior authorization for Eyvonne Left is required.  PA submitted on CoverMyMeds Key [id number] Status is pending  Oral Oncology Clinic will continue to follow.  [facility] Phone [phone] Fax [phone] 03/04/2020 10:57 AM

## 2020-03-04 NOTE — Progress Notes (Signed)
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  Additional 2 mm metastasis left posterior frontal cortex axial image 147.  Additional 3 mm metastasis inferior right thalamus axial image 89.  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  CT Head 08/09/2019: Negative head CT. No evidence of acute intracranial abnormality or metastatic disease.  MRI Brain 08/31/2018: No metastatic disease identified.  Mild chronic microvascular ischemic changes and volume loss of the brain.   Past or anticipated interventions, if any, per neurosurgery:   Past or anticipated interventions, if any, per medical oncology:   Dose of Decadron, if applicable: No  Recent neurologic symptoms, if any:   Seizures: No  Headaches: Yes, has had one daily up until 03/04/2020.  Nausea: Has slight nausea  Dizziness/ataxia: lightheaded with extreme coughing.  Difficulty with hand coordination: No  Focal numbness/weakness: Tingling and numbness in his right arm/hand and shoulder, numbness in the right side of his neck.  Visual deficits/changes: No  Confusion/Memory deficits: No   SAFETY ISSUES:  Prior radiation? Right cervical lymph nodes 8/3-9/05/2019 with Dr. Sondra Come  Pacemaker/ICD? No  Possible current pregnancy? n/a  Is the patient on methotrexate? No  Additional Complaints / other details:

## 2020-03-05 ENCOUNTER — Encounter: Payer: Self-pay | Admitting: Radiation Oncology

## 2020-03-05 ENCOUNTER — Ambulatory Visit
Admission: RE | Admit: 2020-03-05 | Discharge: 2020-03-05 | Disposition: A | Discharge status: Home / Self Care | Payer: BC Managed Care – PPO | Source: Ambulatory Visit | Attending: Radiation Oncology | Admitting: Radiation Oncology

## 2020-03-05 ENCOUNTER — Other Ambulatory Visit: Payer: Self-pay

## 2020-03-05 ENCOUNTER — Other Ambulatory Visit: Payer: Self-pay | Admitting: Radiation Oncology

## 2020-03-05 ENCOUNTER — Encounter (HOSPITAL_COMMUNITY): Payer: Self-pay | Admitting: Internal Medicine

## 2020-03-05 VITALS — BP 116/92 | HR 118 | Temp 98.0°F | Resp 18 | Ht 68.0 in | Wt 174.6 lb

## 2020-03-05 DIAGNOSIS — C3411 Malignant neoplasm of upper lobe, right bronchus or lung: Secondary | ICD-10-CM | POA: Insufficient documentation

## 2020-03-05 DIAGNOSIS — R51 Headache with orthostatic component, not elsewhere classified: Secondary | ICD-10-CM | POA: Diagnosis not present

## 2020-03-05 DIAGNOSIS — I10 Essential (primary) hypertension: Secondary | ICD-10-CM | POA: Insufficient documentation

## 2020-03-05 DIAGNOSIS — Z8579 Personal history of other malignant neoplasms of lymphoid, hematopoietic and related tissues: Secondary | ICD-10-CM | POA: Diagnosis not present

## 2020-03-05 DIAGNOSIS — Z79899 Other long term (current) drug therapy: Secondary | ICD-10-CM | POA: Insufficient documentation

## 2020-03-05 DIAGNOSIS — C778 Secondary and unspecified malignant neoplasm of lymph nodes of multiple regions: Secondary | ICD-10-CM | POA: Insufficient documentation

## 2020-03-05 DIAGNOSIS — R0602 Shortness of breath: Secondary | ICD-10-CM | POA: Insufficient documentation

## 2020-03-05 DIAGNOSIS — Z9221 Personal history of antineoplastic chemotherapy: Secondary | ICD-10-CM | POA: Insufficient documentation

## 2020-03-05 DIAGNOSIS — Z923 Personal history of irradiation: Secondary | ICD-10-CM | POA: Insufficient documentation

## 2020-03-05 DIAGNOSIS — R21 Rash and other nonspecific skin eruption: Secondary | ICD-10-CM | POA: Diagnosis not present

## 2020-03-05 DIAGNOSIS — J439 Emphysema, unspecified: Secondary | ICD-10-CM | POA: Diagnosis not present

## 2020-03-05 DIAGNOSIS — C7931 Secondary malignant neoplasm of brain: Secondary | ICD-10-CM | POA: Insufficient documentation

## 2020-03-05 DIAGNOSIS — J45909 Unspecified asthma, uncomplicated: Secondary | ICD-10-CM | POA: Insufficient documentation

## 2020-03-05 DIAGNOSIS — R5383 Other fatigue: Secondary | ICD-10-CM | POA: Diagnosis not present

## 2020-03-05 DIAGNOSIS — J90 Pleural effusion, not elsewhere classified: Secondary | ICD-10-CM | POA: Diagnosis not present

## 2020-03-05 DIAGNOSIS — C3491 Malignant neoplasm of unspecified part of right bronchus or lung: Secondary | ICD-10-CM

## 2020-03-05 DIAGNOSIS — Z7984 Long term (current) use of oral hypoglycemic drugs: Secondary | ICD-10-CM | POA: Diagnosis not present

## 2020-03-05 DIAGNOSIS — Z85118 Personal history of other malignant neoplasm of bronchus and lung: Secondary | ICD-10-CM | POA: Diagnosis not present

## 2020-03-05 DIAGNOSIS — Z86718 Personal history of other venous thrombosis and embolism: Secondary | ICD-10-CM | POA: Diagnosis not present

## 2020-03-05 DIAGNOSIS — I6782 Cerebral ischemia: Secondary | ICD-10-CM | POA: Diagnosis not present

## 2020-03-05 DIAGNOSIS — E119 Type 2 diabetes mellitus without complications: Secondary | ICD-10-CM | POA: Insufficient documentation

## 2020-03-05 DIAGNOSIS — Z7901 Long term (current) use of anticoagulants: Secondary | ICD-10-CM | POA: Insufficient documentation

## 2020-03-05 MED ORDER — LORAZEPAM 0.5 MG PO TABS: ORAL_TABLET | ORAL | 0 refills | Status: DC

## 2020-03-05 MED ORDER — ALECENSA 150 MG PO CAPS: 600.0000 mg | ORAL_CAPSULE | Freq: Two times a day (BID) | ORAL | 2 refills | Status: DC

## 2020-03-05 MED ORDER — SODIUM CHLORIDE 0.9% FLUSH
10.0000 mL | Freq: Once | INTRAVENOUS | Status: AC
  Administered 2020-03-05: 10 mL via INTRAVENOUS

## 2020-03-05 NOTE — Progress Notes (Signed)
I had asked the patient if he has claustrophobia and he wasn't sure how he would do with simulation. Following simulation today and mask making he was not as comfortable, and would like help managing his claustrophobia. A new rx was sent in for ativan, and we had already discussed side effect profile of this and can use this prior to this week's radiotherapy treatment or prior to subsequent surveillance MRIs.    Carola Rhine, PAC

## 2020-03-05 NOTE — Telephone Encounter (Signed)
Oral Oncology Patient Advocate Encounter  Prior Authorization for Brian [name] has been approved.    PA# [id number] Effective dates: 03/05/20 through 03/05/21  Patient is required to fill at [facility] will continue to follow.   [facility] Phone [phone] Fax [phone] 03/05/2020 9:59 AM

## 2020-03-05 NOTE — Progress Notes (Addendum)
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 gastrocnemius veins, and right popliteal vein. - No cystic structure found in the popliteal fossa.  LEFT: - Findings consistent with acute deep vein thrombosis involving the left peroneal veins, left posterior tibial veins, left soleal veins, and left gastrocnemius veins. - Findings consistent with acute superficial vein thrombosis involving the left great saphenous vein, and left varicosities or other superficial veins.  *See table(s) above for measurements and observations. Electronically signed by Deitra Mayo MD on 02/22/2020 at 3:04:44 PM.    Final        IMPRESSION/PLAN: 1. Progressive metastatic non-small cell lung cancer, adenocarcinoma originally in the right lung now with brain metastases. Dr. Lisbeth Renshaw discusses the pathology findings and reviews the nature of metastatic disease to the brain.  He discusses that the patient's case was reviewed in multidisciplinary brain oncology conference, and it was agreed upon that he would be a good candidate for stereotactic radiosurgery [facility]). We discussed the risks, benefits, short, and long term effects of radiotherapy, and the patient is interested in proceeding. Dr. Lisbeth Renshaw discusses the delivery and logistics of radiotherapy and anticipates a course of 1  fraction of treatment. Written consent is obtained and placed in the chart, a copy was provided to the patient.  He will simulate today and receive treatment this Thursday.  He will also continue systemic therapy under the care of Dr. Julien Nordmann. 2. Radiation recall rash. The patient feels that this has improved over time. We will follow this expectantly. 3. Research project for understanding the patient experience.  In our department we are currently looking at a better way to understand patient experience for those undergoing radiotherapy. We have a trial looking at open face masks in comparison to the standard mask used for immobilization for this treatment.  He was counseled on the opportunity to participate in this project, after discussing the logistics of this, the patient agreed to proceed and was randomized to the traditional mask. 4. Ongoing Metformin Use. The patient did end up taking his metformin this morning despite being asked to hold this. His creatinine is stable to proceed but we will repeat his bun/creatinine Thursday before making recommendations as to when he can resume Metformin. He is in agreement to hold this until  we have these results.  In a visit lasting 45 minutes, greater than 50% of the time was spent face to face discussing the patient's condition, in preparation for the discussion, and coordinating the patient's care.   The above documentation reflects my direct findings during this shared patient visit. Please see the separate note by Dr. Lisbeth Renshaw on this date for the remainder of the patient's plan of care.    Carola Rhine, PAC

## 2020-03-06 ENCOUNTER — Inpatient Hospital Stay (HOSPITAL_BASED_OUTPATIENT_CLINIC_OR_DEPARTMENT_OTHER): Payer: BC Managed Care – PPO | Admitting: Internal Medicine

## 2020-03-06 ENCOUNTER — Inpatient Hospital Stay: Payer: BC Managed Care – PPO

## 2020-03-06 ENCOUNTER — Other Ambulatory Visit: Payer: BC Managed Care – PPO

## 2020-03-06 ENCOUNTER — Encounter: Payer: Self-pay | Admitting: Internal Medicine

## 2020-03-06 ENCOUNTER — Other Ambulatory Visit: Payer: Self-pay

## 2020-03-06 ENCOUNTER — Telehealth: Payer: Self-pay | Admitting: Medical Oncology

## 2020-03-06 VITALS — BP 124/94 | HR 114 | Temp 97.7°F | Resp 18 | Ht 68.0 in | Wt 172.2 lb

## 2020-03-06 DIAGNOSIS — C3411 Malignant neoplasm of upper lobe, right bronchus or lung: Secondary | ICD-10-CM | POA: Diagnosis not present

## 2020-03-06 DIAGNOSIS — C3491 Malignant neoplasm of unspecified part of right bronchus or lung: Secondary | ICD-10-CM

## 2020-03-06 DIAGNOSIS — E119 Type 2 diabetes mellitus without complications: Secondary | ICD-10-CM | POA: Diagnosis not present

## 2020-03-06 DIAGNOSIS — I10 Essential (primary) hypertension: Secondary | ICD-10-CM | POA: Diagnosis not present

## 2020-03-06 DIAGNOSIS — Z7984 Long term (current) use of oral hypoglycemic drugs: Secondary | ICD-10-CM | POA: Diagnosis not present

## 2020-03-06 DIAGNOSIS — Z5111 Encounter for antineoplastic chemotherapy: Secondary | ICD-10-CM

## 2020-03-06 DIAGNOSIS — Z86718 Personal history of other venous thrombosis and embolism: Secondary | ICD-10-CM | POA: Diagnosis not present

## 2020-03-06 DIAGNOSIS — Z79899 Other long term (current) drug therapy: Secondary | ICD-10-CM | POA: Diagnosis not present

## 2020-03-06 DIAGNOSIS — K219 Gastro-esophageal reflux disease without esophagitis: Secondary | ICD-10-CM | POA: Diagnosis not present

## 2020-03-06 DIAGNOSIS — Z7901 Long term (current) use of anticoagulants: Secondary | ICD-10-CM | POA: Diagnosis not present

## 2020-03-06 DIAGNOSIS — C7951 Secondary malignant neoplasm of bone: Secondary | ICD-10-CM | POA: Diagnosis not present

## 2020-03-06 DIAGNOSIS — Z7952 Long term (current) use of systemic steroids: Secondary | ICD-10-CM | POA: Diagnosis not present

## 2020-03-06 DIAGNOSIS — C7931 Secondary malignant neoplasm of brain: Secondary | ICD-10-CM | POA: Diagnosis not present

## 2020-03-06 DIAGNOSIS — J45909 Unspecified asthma, uncomplicated: Secondary | ICD-10-CM | POA: Diagnosis not present

## 2020-03-06 LAB — CMP (CANCER CENTER ONLY)
ALT: 20 U/L (ref 0–44)
AST: 18 U/L (ref 15–41)
Albumin: 3.3 g/dL — ABNORMAL LOW (ref 3.5–5.0)
Alkaline Phosphatase: 132 U/L — ABNORMAL HIGH (ref 38–126)
Anion gap: 10 (ref 5–15)
BUN: 11 mg/dL (ref 6–20)
CO2: 23 mmol/L (ref 22–32)
Calcium: 9.3 mg/dL (ref 8.9–10.3)
Chloride: 105 mmol/L (ref 98–111)
Creatinine: 0.82 mg/dL (ref 0.61–1.24)
GFR, Est AFR Am: >60 mL/min (ref >60)
GFR, Estimated: >60 mL/min (ref >60)
Glucose, Bld: 101 mg/dL — ABNORMAL HIGH (ref 70–99)
Potassium: 4 mmol/L (ref 3.5–5.1)
Sodium: 138 mmol/L (ref 135–145)
Total Bilirubin: 0.4 mg/dL (ref 0.3–1.2)
Total Protein: 6.5 g/dL (ref 6.5–8.1)

## 2020-03-06 LAB — CBC WITH DIFFERENTIAL (CANCER CENTER ONLY)
Abs Immature Granulocytes: 0.23 10*3/uL — ABNORMAL HIGH (ref 0.00–0.07)
Basophils Absolute: 0.1 10*3/uL (ref 0.0–0.1)
Basophils Relative: 1 %
Eosinophils Absolute: 0.1 10*3/uL (ref 0.0–0.5)
Eosinophils Relative: 1 %
HCT: 39.7 % (ref 39.0–52.0)
Hemoglobin: 13.4 g/dL (ref 13.0–17.0)
Immature Granulocytes: 3 %
Lymphocytes Relative: 5 %
Lymphs Abs: 0.5 10*3/uL — ABNORMAL LOW (ref 0.7–4.0)
MCH: 30.1 pg (ref 26.0–34.0)
MCHC: 33.8 g/dL (ref 30.0–36.0)
MCV: 89.2 fL (ref 80.0–100.0)
Monocytes Absolute: 2 10*3/uL — ABNORMAL HIGH (ref 0.1–1.0)
Monocytes Relative: 22 %
Neutro Abs: 6.2 10*3/uL (ref 1.7–7.7)
Neutrophils Relative %: 68 %
Platelet Count: 179 10*3/uL (ref 150–400)
RBC: 4.45 MIL/uL (ref 4.22–5.81)
RDW: 14.5 % (ref 11.5–15.5)
WBC Count: 9 10*3/uL (ref 4.0–10.5)
nRBC: 0 % (ref 0.0–0.2)

## 2020-03-06 LAB — TSH: TSH: 18.877 u[IU]/mL — ABNORMAL HIGH (ref 0.320–4.118)

## 2020-03-06 MED ORDER — METHYLPREDNISOLONE 4 MG PO TBPK
ORAL_TABLET | ORAL | 0 refills | Status: DC
Start: 2020-03-06 — End: 2020-04-03

## 2020-03-06 NOTE — Telephone Encounter (Signed)
Oral Chemotherapy Pharmacist Encounter  I spoke with patient for overview of: [name] (alectinib) for the treatment of ALK-rearrangement positive, metastatic NSCLC, planned duration until disease progression or unacceptable toxicity.  Counseled patient on administration, dosing, side effects, monitoring, drug-food interactions, safe handling, storage, and disposal.  Patient will take [name] 128m capsules, 4 capsules (6046m by mouth twice daily with food.  [name] start date: [facility] effects include but are not limited to: muscle pain, edema, constipation, fatigue, increased blood sugar, decreased electrolytes, hepatotoxicity, and decreased blood counts.    Reviewed with patient importance of keeping a medication schedule and plan for any missed doses. Medication education handout placed in mail for patient.  Medication reconciliation performed and medication/allergy list updated.  Patient's insurance [name] ([phone] All questions answered.  Mr. OsDetloffoiced understanding and appreciation.   Patient knows to call the office with questions or concerns.  ReLeron CroakPharmD, BCPS Hematology/Oncology Clinical Pharmacist [phone]/11/2019 2:59 PM

## 2020-03-06 NOTE — Progress Notes (Signed)
DISCONTINUE ON PATHWAY REGIMEN - Non-Small Cell Lung     A cycle is every 21 days:     Pembrolizumab      Pemetrexed      Carboplatin   **Always confirm dose/schedule in your pharmacy ordering system**  REASON: Other Reason PRIOR TREATMENT: [id number]: Pembrolizumab 200 mg + Pemetrexed 500 mg/m2 + Carboplatin AUC=5 q21 Days x 4 Cycles TREATMENT RESPONSE: Unable to Evaluate  START ON PATHWAY REGIMEN - Non-Small Cell Lung     PO twice daily:     [name]   **Always confirm dose/schedule in your pharmacy ordering system**  Patient Characteristics: Stage IV Metastatic, Nonsquamous, Initial Molecular Targeted Therapy, ALK Rearrangement Positive Therapeutic Status: Stage IV Metastatic Histology: Nonsquamous Cell ROS1 Rearrangement Status: Negative Other Mutations/Biomarkers: No Other Actionable Mutations NTRK Gene Fusion Status: Negative PD-L1 Expression Status: PD-L1 Positive 1-49% (TPS) Chemotherapy/Immunotherapy LOT: Not Appropriate Molecular Targeted Therapy: Initial Molecular Targeted Therapy MET Exon 14 Mutation Status: Negative RET Gene Fusion Status: Negative ALK Rearrangement Status: Positive EGFR Mutation Status: Negative/Wild Type BRAF [id number] Mutation Status: Negative Intent of Therapy: Non-Curative / Palliative Intent, Discussed with Patient

## 2020-03-06 NOTE — Telephone Encounter (Signed)
Confirmed arrival time for thoracentesis.

## 2020-03-06 NOTE — Progress Notes (Signed)
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 signed by Deitra Mayo MD on 02/22/2020 at 3:04:44 PM.    Final     ASSESSMENT AND PLAN: This is a very pleasant 59 years old white male with metastatic non-small cell lung cancer, adenocarcinoma now with ALK gene translocation.  The patient was initially diagnosed as a stage IIIa non-small cell lung cancer status post a course of concurrent chemoradiation with weekly carboplatin and paclitaxel followed by 1 year of consolidation treatment with immunotherapy with Imfinzi. He had evidence for disease progression recently. Repeat tissue biopsy from the left supraclavicular lymph nodes and molecular studies showed that the patient has positive  ALK gene translocation.  His previous molecular studies by [employer] was negative. I had a lengthy discussion with the patient today about his condition and treatment options. He received 1 cycle of systemic chemotherapy  with carboplatin, Alimta and Keytruda.  We will discontinue his systemic chemotherapy for now because of the new findings on the molecular studies. I recommended for the patient treatment with [name] 600 mg p.o. 3 times daily.  We will send the prescription to his pharmacy for refill and he is expected to start this treatment in the next few days. I gave the patient handout about the drug and adverse effect and he will also have teaching from the oral pharmacist about the toxicity of the drug. For the right pleural effusion, he will undergo right-sided ultrasound guided thoracentesis tomorrow. For the current deep venous thrombosis, he will start treatment with Lovenox today and the patient had IVC filter placed yesterday. For the persistent dry cough and shortness of breath, high give the patient prescription for Medrol Dosepak. I will see him back for follow-up visit in 2 weeks for evaluation and repeat blood work. He was advised to call immediately if he has any other concerning symptoms in the interval. The patient voices understanding of current disease status and treatment options and is in agreement with the current care plan.  All questions were answered. The patient knows to call the clinic with any problems, questions or concerns. We can certainly see the patient much sooner if necessary.  The total time spent in the appointment was 40 minutes.  Disclaimer: This note was dictated with voice recognition software. Similar sounding words can inadvertently be transcribed and may not be corrected upon review.

## 2020-03-06 NOTE — Progress Notes (Signed)
Pt instructed on self -injection of Lovenox. He voiced understanding and feels comfortable giving it daily.

## 2020-03-06 NOTE — Patient Instructions (Signed)
[name] oral capsules What is this medicine? [name] (al EK ti nib) is a medicine that targets proteins in cancer cells and stops the cancer cells from growing. It is used to treat non-small cell lung cancer. This medicine may be used for other purposes; ask your health care provider or pharmacist if you have questions. COMMON BRAND NAME(S): [name] What should I tell my health care provider before I take this medicine? They need to know if you have any of these conditions:  heart disease  history of irregular heartbeat  liver disease  lung or breathing disease, like asthma  muscle aches or weakness  an unusual or allergic reaction to alectinib, other medicines, foods, dyes, or preservatives  pregnant or trying to get pregnant  breast-feeding How should I use this medicine? Take this medicine by mouth with a glass of water. Follow the directions on the prescription label. Do not cut, crush or chew this medicine. Take this medicine with food. If you vomit after taking your medicine, take your next dose at the regular time and do not take an extra dose. Take the doses about 12 hours apart. Take your medicine at regular intervals. Do not take it more often than directed. Do not stop taking except on your doctor's advice. Talk to your pediatrician regarding the use of this medicine in children. Special care may be needed. Overdosage: If you think you have taken too much of this medicine contact a poison control center or emergency room at once. NOTE: This medicine is only for you. Do not share this medicine with others. What if I miss a dose? If you miss a dose or if you vomit after taking your medicine, do not take another dose. Take the next dose at your regular time. Do not take double or extra doses. What may interact with this medicine? Interactions are not expected. This list may not describe all possible interactions. Give your health care provider a list of all the medicines,  herbs, non-prescription drugs, or dietary supplements you use. Also tell them if you smoke, drink alcohol, or use illegal drugs. Some items may interact with your medicine. What should I watch for while using this medicine? Tell your doctor or health care professional right away if you have any change in your eyesight. You may need blood work done while you are taking this medicine. This medicine can make you more sensitive to the sun. Keep out of the sun. If you cannot avoid being in the sun, wear protective clothing and use sunscreen and lip balm with a SPF 50 or greater during treatment and for at least 7 days after the last dose. Do not use sun lamps or tanning beds/booths. Do not become pregnant while taking this medicine or for 1 week after the last dose. Men should use effective contraception during treatment and for 3 months after the last dose. Women should inform their doctor if they wish to become pregnant or think they might be pregnant. There is a potential for serious side effects to an unborn child. Talk to your health care professional or pharmacist for more information. Do not breast-feed an infant while taking this medicine or for 1 week after the last dose. Avoid taking products that contain aspirin, acetaminophen, ibuprofen, naproxen, or ketoprofen unless instructed by your doctor. These medicines may hide a fever. Be careful brushing and flossing your teeth or using a toothpick because you may get an infection or bleed more easily. If you have any dental work  done, tell your dentist you are receiving this medicine. Call your doctor or health care professional for advice if you get a fever, chills or sore throat, or other symptoms of a cold or flu. Do not treat yourself. This drug decreases your body's ability to fight infections. Try to avoid being around people who are sick. This medicine may increase your risk to bruise or bleed. Call your doctor or health care professional if you  notice any unusual bleeding. This drug may make you feel generally unwell. This is not uncommon, as chemotherapy can affect healthy cells as well as cancer cells. Report any side effects. Continue your course of treatment even though you feel ill unless your doctor tells you to stop. What side effects may I notice from receiving this medicine? Side effects that you should report to your doctor or health care professional as soon as possible:  allergic reactions like skin rash, itching or hives, swelling of the face, lips, or tongue  breathing problems  changes in vision  signs of infection - fever or chills, cough, sore throat, pain or difficulty passing urine  signs and symptoms of a dangerous change in heartbeat or heart rhythm like chest pain; dizziness; fast or irregular heartbeat; palpitations; feeling faint or lightheaded, falls; breathing problems  signs of decreased platelets or bleeding - bruising, pinpoint red spots on the skin, black, tarry stools, blood in the urine  signs of decreased red blood cells - unusually weak or tired, feeling faint or lightheaded, falls  signs and symptoms of liver injury like dark yellow or brown urine; general ill feeling or flu-like symptoms; light-colored stools; loss of appetite; nausea; right upper belly pain; unusually weak or tired; yellowing of the eyes or skin  signs and symptoms of muscle injury like dark urine; trouble passing urine or change in the amount of urine; unusually weak or tired; muscle pain or side or back pain  unusually slow heartbeat Side effects that usually do not require medical attention (report to your doctor or health care professional if they continue or are bothersome):  constipation  diarrhea  headache  nausea, vomiting  pain  red spots on the skin  swelling of the ankles, feet, hands  unusually weak or tired This list may not describe all possible side effects. Call your doctor for medical advice  about side effects. You may report side effects to FDA at 1-800-FDA-1088. Where should I keep my medicine? Keep out of the reach of children. Do not store above 30 degrees C (86 degrees F). Keep this medicine in the original container. Keep this medicine dry and away from light. Throw away any unused medicine after the expiration date. NOTE: This sheet is a summary. It may not cover all possible information. If you have questions about this medicine, talk to your doctor, pharmacist, or health care provider.  2020 [employer] Standard (2017-01-08 12:19:39)

## 2020-03-07 ENCOUNTER — Telehealth: Payer: Self-pay | Admitting: Internal Medicine

## 2020-03-07 ENCOUNTER — Ambulatory Visit: Payer: BC Managed Care – PPO | Admitting: Radiation Oncology

## 2020-03-07 ENCOUNTER — Ambulatory Visit (HOSPITAL_COMMUNITY)
Admission: RE | Admit: 2020-03-07 | Discharge: 2020-03-07 | Disposition: A | Discharge status: Home / Self Care | Payer: BC Managed Care – PPO | Source: Ambulatory Visit | Attending: Internal Medicine | Admitting: Internal Medicine

## 2020-03-07 ENCOUNTER — Telehealth: Payer: Self-pay | Admitting: Medical Oncology

## 2020-03-07 ENCOUNTER — Ambulatory Visit (HOSPITAL_COMMUNITY)
Admission: RE | Admit: 2020-03-07 | Discharge: 2020-03-07 | Disposition: A | Discharge status: Home / Self Care | Payer: BC Managed Care – PPO | Source: Ambulatory Visit | Attending: Student | Admitting: Student

## 2020-03-07 ENCOUNTER — Ambulatory Visit: Payer: BC Managed Care – PPO

## 2020-03-07 DIAGNOSIS — C3491 Malignant neoplasm of unspecified part of right bronchus or lung: Secondary | ICD-10-CM | POA: Insufficient documentation

## 2020-03-07 DIAGNOSIS — C384 Malignant neoplasm of pleura: Secondary | ICD-10-CM | POA: Diagnosis not present

## 2020-03-07 DIAGNOSIS — J90 Pleural effusion, not elsewhere classified: Secondary | ICD-10-CM | POA: Diagnosis not present

## 2020-03-07 DIAGNOSIS — Z9889 Other specified postprocedural states: Secondary | ICD-10-CM | POA: Diagnosis not present

## 2020-03-07 DIAGNOSIS — R918 Other nonspecific abnormal finding of lung field: Secondary | ICD-10-CM | POA: Diagnosis not present

## 2020-03-07 MED ORDER — LIDOCAINE HCL 1 % IJ SOLN
INTRAMUSCULAR | Status: AC
  Filled 2020-03-07: qty 20

## 2020-03-07 NOTE — Telephone Encounter (Signed)
Scheduled per los. Called and spoke with patient. Confirmed appt 

## 2020-03-07 NOTE — Procedures (Signed)
PROCEDURE SUMMARY:  Successful US guided right thoracentesis. Yielded 450 ml of light red fluid. Pt tolerated procedure well. No immediate complications.  Specimen sent for labs. CXR ordered; no post-procedure pneumothorax  EBL < 5 mL  Theresa Duty, NP 03/07/2020 10:08 AM

## 2020-03-07 NOTE — Telephone Encounter (Signed)
Faxed [facility] to [facility].

## 2020-03-08 ENCOUNTER — Ambulatory Visit: Payer: BC Managed Care – PPO | Admitting: Radiation Oncology

## 2020-03-08 ENCOUNTER — Encounter (HOSPITAL_COMMUNITY): Payer: Self-pay | Admitting: Internal Medicine

## 2020-03-08 LAB — CYTOLOGY - NON PAP

## 2020-03-11 ENCOUNTER — Encounter: Payer: Self-pay | Admitting: Internal Medicine

## 2020-03-12 ENCOUNTER — Ambulatory Visit: Payer: BC Managed Care – PPO | Admitting: Radiation Oncology

## 2020-03-12 ENCOUNTER — Telehealth: Payer: Self-pay

## 2020-03-12 NOTE — Telephone Encounter (Signed)
TC to pt in regard to his advice request message left in the in-basket. Pt wanted to know about hernia repair surgery soon. Spoke with Cassie PA and Dr Julien Nordmann and let pt know that they advised that he hold off for a while until they know that his treatment with the oral chemo is working. Pt verbalized understanding. No further problems or concerns at this time.

## 2020-03-15 ENCOUNTER — Encounter: Payer: Self-pay | Admitting: Internal Medicine

## 2020-03-19 ENCOUNTER — Other Ambulatory Visit: Payer: Self-pay

## 2020-03-19 ENCOUNTER — Inpatient Hospital Stay (HOSPITAL_BASED_OUTPATIENT_CLINIC_OR_DEPARTMENT_OTHER): Payer: BC Managed Care – PPO | Admitting: Internal Medicine

## 2020-03-19 ENCOUNTER — Telehealth: Payer: Self-pay | Admitting: Radiation Therapy

## 2020-03-19 ENCOUNTER — Encounter: Payer: Self-pay | Admitting: Internal Medicine

## 2020-03-19 ENCOUNTER — Inpatient Hospital Stay: Payer: BC Managed Care – PPO

## 2020-03-19 VITALS — BP 123/82 | HR 98 | Temp 96.5°F | Resp 18 | Ht 68.0 in | Wt 174.6 lb

## 2020-03-19 DIAGNOSIS — C7931 Secondary malignant neoplasm of brain: Secondary | ICD-10-CM

## 2020-03-19 DIAGNOSIS — Z5111 Encounter for antineoplastic chemotherapy: Secondary | ICD-10-CM | POA: Diagnosis not present

## 2020-03-19 DIAGNOSIS — Z79899 Other long term (current) drug therapy: Secondary | ICD-10-CM | POA: Diagnosis not present

## 2020-03-19 DIAGNOSIS — E119 Type 2 diabetes mellitus without complications: Secondary | ICD-10-CM | POA: Diagnosis not present

## 2020-03-19 DIAGNOSIS — C3491 Malignant neoplasm of unspecified part of right bronchus or lung: Secondary | ICD-10-CM | POA: Diagnosis not present

## 2020-03-19 DIAGNOSIS — Z7952 Long term (current) use of systemic steroids: Secondary | ICD-10-CM | POA: Diagnosis not present

## 2020-03-19 DIAGNOSIS — J45909 Unspecified asthma, uncomplicated: Secondary | ICD-10-CM | POA: Diagnosis not present

## 2020-03-19 DIAGNOSIS — K219 Gastro-esophageal reflux disease without esophagitis: Secondary | ICD-10-CM | POA: Diagnosis not present

## 2020-03-19 DIAGNOSIS — C7951 Secondary malignant neoplasm of bone: Secondary | ICD-10-CM | POA: Diagnosis not present

## 2020-03-19 DIAGNOSIS — C3411 Malignant neoplasm of upper lobe, right bronchus or lung: Secondary | ICD-10-CM | POA: Diagnosis not present

## 2020-03-19 DIAGNOSIS — Z7901 Long term (current) use of anticoagulants: Secondary | ICD-10-CM | POA: Diagnosis not present

## 2020-03-19 DIAGNOSIS — I10 Essential (primary) hypertension: Secondary | ICD-10-CM | POA: Diagnosis not present

## 2020-03-19 DIAGNOSIS — Z7984 Long term (current) use of oral hypoglycemic drugs: Secondary | ICD-10-CM | POA: Diagnosis not present

## 2020-03-19 DIAGNOSIS — Z86718 Personal history of other venous thrombosis and embolism: Secondary | ICD-10-CM | POA: Diagnosis not present

## 2020-03-19 LAB — CMP (CANCER CENTER ONLY)
ALT: 78 U/L — ABNORMAL HIGH (ref 0–44)
AST: 45 U/L — ABNORMAL HIGH (ref 15–41)
Albumin: 3.1 g/dL — ABNORMAL LOW (ref 3.5–5.0)
Alkaline Phosphatase: 156 U/L — ABNORMAL HIGH (ref 38–126)
Anion gap: 6 (ref 5–15)
BUN: 13 mg/dL (ref 6–20)
CO2: 24 mmol/L (ref 22–32)
Calcium: 9.2 mg/dL (ref 8.9–10.3)
Chloride: 106 mmol/L (ref 98–111)
Creatinine: 0.93 mg/dL (ref 0.61–1.24)
GFR, Est AFR Am: >60 mL/min (ref >60)
GFR, Estimated: >60 mL/min (ref >60)
Glucose, Bld: 151 mg/dL — ABNORMAL HIGH (ref 70–99)
Potassium: 3.7 mmol/L (ref 3.5–5.1)
Sodium: 136 mmol/L (ref 135–145)
Total Bilirubin: 0.5 mg/dL (ref 0.3–1.2)
Total Protein: 6.1 g/dL — ABNORMAL LOW (ref 6.5–8.1)

## 2020-03-19 LAB — CBC WITH DIFFERENTIAL (CANCER CENTER ONLY)
Abs Immature Granulocytes: 0.18 10*3/uL — ABNORMAL HIGH (ref 0.00–0.07)
Basophils Absolute: 0.1 10*3/uL (ref 0.0–0.1)
Basophils Relative: 1 %
Eosinophils Absolute: 0.3 10*3/uL (ref 0.0–0.5)
Eosinophils Relative: 3 %
HCT: 38.3 % — ABNORMAL LOW (ref 39.0–52.0)
Hemoglobin: 12.9 g/dL — ABNORMAL LOW (ref 13.0–17.0)
Immature Granulocytes: 2 %
Lymphocytes Relative: 5 %
Lymphs Abs: 0.5 10*3/uL — ABNORMAL LOW (ref 0.7–4.0)
MCH: 30.1 pg (ref 26.0–34.0)
MCHC: 33.7 g/dL (ref 30.0–36.0)
MCV: 89.5 fL (ref 80.0–100.0)
Monocytes Absolute: 1.1 10*3/uL — ABNORMAL HIGH (ref 0.1–1.0)
Monocytes Relative: 13 %
Neutro Abs: 6.6 10*3/uL (ref 1.7–7.7)
Neutrophils Relative %: 76 %
Platelet Count: 192 10*3/uL (ref 150–400)
RBC: 4.28 MIL/uL (ref 4.22–5.81)
RDW: 16.6 % — ABNORMAL HIGH (ref 11.5–15.5)
WBC Count: 8.6 10*3/uL (ref 4.0–10.5)
nRBC: 0 % (ref 0.0–0.2)

## 2020-03-19 LAB — CK: Total CK: 126 U/L (ref 49–397)

## 2020-03-19 NOTE — Progress Notes (Signed)
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 carboplatin, Alimta and Keytruda.  We will discontinue his systemic chemotherapy for now because of the new findings on the molecular studies. The patient is currently on treatment with [name] 600 mg p.o. twice daily.  He tolerated the first 2 weeks of this treatment fairly well with no concerning adverse effects. I recommended for him  to continue his current treatment with [name] with the same dose. For the constipation he will continue his current treatment with Dulcolax and the patient was advised to use MiraLAX or milk of magnesia if no improvement. For the current deep venous thrombosis, he will continue treatment with Lovenox today and the patient had IVC filter placed yesterday. For the abdominal hernia, I will refer him to general surgery for evaluation. The patient was advised to call immediately if he has any concerning symptoms in the interval. The patient voices understanding of current disease  status and treatment options and is in agreement with the current care plan.  All questions were answered. The patient knows to call the clinic with any problems, questions or concerns. We can certainly see the patient much sooner if necessary.  Disclaimer: This note was dictated with voice recognition software. Similar sounding words can inadvertently be transcribed and may not be corrected upon review.

## 2020-03-19 NOTE — Telephone Encounter (Signed)
I spoke with Mr. Natter today about rescheduling the radiosurgery planning now that he has started systemic treatment. At this time he is not comfortable rescheduling due to concerns with his breathing and not being able to lay or hold still during treatment. He was very uncomfortable with the mask but does not feel that Ativan will help with this, he worries that this will make matters worse because he doesn't know how he will respond to the medication. I tried to encourage him by letting him know we have television monitors to observe patients throughout the treatment process. If moving is his main concern, this is monitored and we would ensure his correct positioning with x-rays between each treatment field and if motion is detected. He was thankful for the information but still not comfortable with the idea of completing treatment at this time.       I asked what his desired next steps are, such as a follow-up MRI to keep a close eye on the untreated areas and for possible treatment in the future once he feels his breathing issues have improved. He is agreeable to this plan. I will reach out to Dr. Lisbeth Renshaw and Dr. Julien Nordmann to see when they would like me to set up the next brain MRI.   Mont Dutton R.T.(R)(T) Radiation Special Procedures Navigator

## 2020-03-20 ENCOUNTER — Telehealth: Payer: Self-pay | Admitting: Internal Medicine

## 2020-03-20 NOTE — Telephone Encounter (Signed)
Scheduled per los. Called and left msg. Mailed printout  °

## 2020-03-21 ENCOUNTER — Other Ambulatory Visit: Payer: Self-pay | Admitting: Radiation Therapy

## 2020-03-21 ENCOUNTER — Ambulatory Visit (INDEPENDENT_AMBULATORY_CARE_PROVIDER_SITE_OTHER): Payer: BC Managed Care – PPO | Admitting: Pulmonary Disease

## 2020-03-21 ENCOUNTER — Ambulatory Visit (INDEPENDENT_AMBULATORY_CARE_PROVIDER_SITE_OTHER): Payer: BC Managed Care – PPO

## 2020-03-21 ENCOUNTER — Encounter: Payer: Self-pay | Admitting: Pulmonary Disease

## 2020-03-21 ENCOUNTER — Other Ambulatory Visit: Payer: Self-pay

## 2020-03-21 VITALS — BP 118/78 | HR 93 | Temp 98.5°F | Ht 68.0 in | Wt 176.8 lb

## 2020-03-21 DIAGNOSIS — Z9889 Other specified postprocedural states: Secondary | ICD-10-CM | POA: Diagnosis not present

## 2020-03-21 DIAGNOSIS — C7931 Secondary malignant neoplasm of brain: Secondary | ICD-10-CM

## 2020-03-21 DIAGNOSIS — R0602 Shortness of breath: Secondary | ICD-10-CM | POA: Diagnosis not present

## 2020-03-21 DIAGNOSIS — C3490 Malignant neoplasm of unspecified part of unspecified bronchus or lung: Secondary | ICD-10-CM | POA: Diagnosis not present

## 2020-03-21 DIAGNOSIS — J432 Centrilobular emphysema: Secondary | ICD-10-CM

## 2020-03-21 DIAGNOSIS — C3491 Malignant neoplasm of unspecified part of right bronchus or lung: Secondary | ICD-10-CM

## 2020-03-21 NOTE — Progress Notes (Signed)
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 tablet Take 37.5 mg by mouth daily.    No facility-administered encounter medications on file as of 03/21/2020.     Review of Systems  Review of Systems  Constitutional: Positive for fatigue. Negative for activity change, chills, fever and unexpected weight change.  HENT: Negative for postnasal drip, rhinorrhea, sinus pressure, sinus pain and sore throat.   Eyes: Negative.   Respiratory: Positive for shortness of breath. Negative for cough and wheezing.   Cardiovascular: Negative for chest pain and palpitations.  Gastrointestinal: Negative for constipation, diarrhea, nausea and vomiting.  Endocrine: Negative.   Genitourinary: Negative.   Musculoskeletal: Negative.   Skin: Negative.   Neurological: Negative for dizziness and headaches.  Psychiatric/Behavioral: Negative.  Negative for dysphoric mood. The patient is not nervous/anxious.   All other systems reviewed and are negative.     Physical Exam  BP 118/78 (BP Location: Left Arm, Cuff Size: Normal)   Pulse 93   Temp 98.5 F (36.9 C) (Oral)   Ht 5' 8" (1.727 m)   Wt 176 lb 12.8 oz (80.2 kg)   SpO2 96%   BMI 26.88 kg/m   Wt Readings from Last 5 Encounters:  03/21/20 176 lb 12.8 oz (80.2 kg)  03/19/20 174 lb 9.6 oz (79.2 kg)  03/06/20 172 lb 3.2 oz (78.1 kg)  03/05/20 174 lb 9.6 oz (79.2 kg)  03/04/20 170 lb (77.1 kg)    BMI Readings from Last 5 Encounters:  03/21/20 26.88 kg/m  03/19/20 26.55 kg/m  03/06/20 26.18 kg/m  03/05/20 26.55 kg/m  03/04/20 25.85 kg/m     Physical Exam Vitals and nursing note reviewed.  Constitutional:      General: He is not in acute distress.    Appearance: Normal appearance. He is normal weight.  HENT:     Head: Normocephalic and atraumatic.     Right Ear: Hearing and external ear normal.     Left Ear: Hearing and external ear normal.     Nose: No mucosal edema.     Right Turbinates: Not enlarged.     Left Turbinates: Not enlarged.  Cardiovascular:     Rate and Rhythm: Normal rate and regular rhythm.     Pulses: Normal pulses.     Heart sounds: Normal heart sounds. No murmur heard.   Pulmonary:     Effort: Pulmonary effort is normal.     Breath sounds: Normal breath sounds. No decreased breath sounds, wheezing or rales.  Musculoskeletal:     Cervical back: Normal range of motion.     Right lower leg: No edema.     Left lower leg: No edema.  Lymphadenopathy:     Cervical: No cervical adenopathy.  Skin:    General: Skin is warm and dry.     Capillary Refill: Capillary refill takes less than 2 seconds.     Findings: No erythema or rash.  Neurological:     General: No focal deficit present.     Mental Status: He is alert and oriented to person, place, and time.     Motor: No weakness.     Coordination: Coordination normal.     Gait: Gait is intact. Gait normal.  Psychiatric:        Mood and Affect: Mood normal.          Behavior: Behavior normal.  Behavior is cooperative.        Thought Content: Thought content normal.        Judgment: Judgment normal.       Assessment & Plan:   Centrilobular emphysema ([place]) Plan: Continue Trelegy Ellipta Chest x-ray today  Adenocarcinoma of right lung, stage 4 (HCC) Plan: Keep follow-up with Dr. Earlie Server  Shortness of breath Improved status post thoracentesis  Plan: Walk today in office stable We will get chest x-ray today  S/P thoracentesis Plan: Chest x-ray today    Return in about 3 months (around 06/21/2020), or if symptoms worsen or fail to improve, for Follow up with Dr. Elsworth Soho.   Lauraine Rinne, NP 03/21/2020   This appointment required 26 minutes of patient care (this includes precharting, chart review, review of results, face-to-face care, etc.).

## 2020-03-21 NOTE — Assessment & Plan Note (Signed)
Plan: Continue Trelegy Ellipta Chest x-ray today

## 2020-03-21 NOTE — Patient Instructions (Addendum)
You were seen today by Lauraine Rinne, NP  for:   1. Centrilobular emphysema (HCC)  Walk today   Trelegy Ellipta 100 >>> 1 puff daily in the morning >>>rinse mouth out after use  >>> This inhaler contains 3 medications that help manage her respiratory status, contact our office if you cannot afford this medication or unable to remain on this medication  Only use your albuterol as a rescue medication to be used if you can't catch your breath by resting or doing a relaxed purse lip breathing pattern.  - The less you use it, the better it will work when you need it. - Ok to use up to 2 puffs  every 4 hours if you must but call for immediate appointment if use goes up over your usual need - Don't leave home without it !!  (think of it like the spare tire for your car)   Note your daily symptoms > remember "red flags" for COPD:   >>>Increase in cough >>>increase in sputum production >>>increase in shortness of breath or activity  intolerance.   If you notice these symptoms, please call the office to be seen.    2. Adenocarcinoma of right lung, stage 4 (HCC)  Continue follow-up with oncology  3. Shortness of breath 4. S/P thoracentesis  - DG Chest 2 View; Future  Status post thoracentesis on 03/07/2020  We will get a chest x-ray today  We recommend today:  Orders Placed This Encounter  Procedures  . DG Chest 2 View    Standing Status:   Future    Standing Expiration Date:   09/21/2020    Order Specific Question:   Reason for Exam (SYMPTOM  OR DIAGNOSIS REQUIRED)    Answer:   s/p thoro    Order Specific Question:   Preferred imaging location?    Answer:   Internal    Order Specific Question:   Radiology Contrast Protocol - do NOT remove file path    Answer:   [email or web address]   Orders Placed This Encounter  Procedures  . DG Chest 2 View   No orders of the defined types were placed in this encounter.   Follow Up:    Return in  about 3 months (around 06/21/2020), or if symptoms worsen or fail to improve, for Follow up with Dr. Elsworth Soho.   Please do your part to reduce the spread of COVID-19:      Reduce your risk of any infection  and COVID19 by using the similar precautions used for avoiding the common cold or flu:  Marland Kitchen Wash your hands often with soap and warm water for at least 20 seconds.  If soap and water are not readily available, use an alcohol-based hand sanitizer with at least 60% alcohol.  . If coughing or sneezing, cover your mouth and nose by coughing or sneezing into the elbow areas of your shirt or coat, into a tissue or into your sleeve (not your hands). Langley Gauss A MASK when in public  . Avoid shaking hands with others and consider head nods or verbal greetings only. . Avoid touching your eyes, nose, or mouth with unwashed hands.  . Avoid close contact with people who are sick. . Avoid places or events with large numbers of people in one location, like concerts or sporting events. . If you have some symptoms but not all symptoms, continue to monitor at home and seek medical attention if your symptoms worsen. . If you are having a medical  emergency, call 911.   [place] / e-Visit: [email or web address]         [facility]: [facility]: [phone]                   [facility]: [phone]     It is flu season:   >>> Best ways to protect herself from the flu: Receive the yearly flu vaccine, practice good hand hygiene washing with soap and also using hand sanitizer when available, eat a nutritious meals, get adequate rest, hydrate appropriately   Please contact the office if your symptoms worsen or you have concerns that you are not improving.   Thank you for choosing [facility] for your healthcare, and for allowing [place] to partner with  you on your healthcare journey. I am thankful to be able to provide care to you today.   Wyn Quaker FNP-C

## 2020-03-21 NOTE — Assessment & Plan Note (Signed)
Improved status post thoracentesis  Plan: Walk today in office stable We will get chest x-ray today

## 2020-03-21 NOTE — Assessment & Plan Note (Signed)
Plan: Chest x-ray today 

## 2020-03-21 NOTE — Assessment & Plan Note (Signed)
Plan: Keep follow-up with Dr. Earlie Server

## 2020-03-25 ENCOUNTER — Encounter: Payer: Self-pay | Admitting: Internal Medicine

## 2020-03-26 ENCOUNTER — Ambulatory Visit: Payer: Self-pay | Attending: Internal Medicine

## 2020-03-26 DIAGNOSIS — Z23 Encounter for immunization: Secondary | ICD-10-CM

## 2020-03-26 NOTE — Progress Notes (Signed)
   Covid-19 Vaccination Clinic  Name:  Brian [name]    MRN: [id number] DOB: June 11, 1961  03/26/2020  Brian [name] was observed post Covid-19 immunization for 15 minutes without incident. He was provided with Vaccine Information Sheet and instruction to access the V-Safe system.   Brian [name] was instructed to call 911 with any severe reactions post vaccine: Marland Kitchen Difficulty breathing  . Swelling of face and throat  . A fast heartbeat  . A bad rash all over body  . Dizziness and weakness

## 2020-03-28 ENCOUNTER — Other Ambulatory Visit: Payer: Self-pay | Admitting: Physician Assistant

## 2020-03-28 ENCOUNTER — Other Ambulatory Visit: Payer: Self-pay | Admitting: Internal Medicine

## 2020-03-28 DIAGNOSIS — C3491 Malignant neoplasm of unspecified part of right bronchus or lung: Secondary | ICD-10-CM

## 2020-03-29 ENCOUNTER — Other Ambulatory Visit: Payer: Self-pay | Admitting: Internal Medicine

## 2020-03-29 DIAGNOSIS — I8290 Acute embolism and thrombosis of unspecified vein: Secondary | ICD-10-CM

## 2020-03-29 NOTE — Telephone Encounter (Signed)
Please see refill.

## 2020-04-03 ENCOUNTER — Other Ambulatory Visit: Payer: Self-pay

## 2020-04-03 ENCOUNTER — Inpatient Hospital Stay: Payer: BC Managed Care – PPO | Attending: Internal Medicine | Admitting: Internal Medicine

## 2020-04-03 ENCOUNTER — Other Ambulatory Visit: Payer: Self-pay | Admitting: Physician Assistant

## 2020-04-03 ENCOUNTER — Inpatient Hospital Stay: Payer: BC Managed Care – PPO

## 2020-04-03 ENCOUNTER — Encounter: Payer: Self-pay | Admitting: Internal Medicine

## 2020-04-03 VITALS — BP 118/76 | HR 95 | Temp 98.6°F | Resp 18 | Ht 68.0 in | Wt 179.0 lb

## 2020-04-03 DIAGNOSIS — Z7984 Long term (current) use of oral hypoglycemic drugs: Secondary | ICD-10-CM | POA: Diagnosis not present

## 2020-04-03 DIAGNOSIS — C3411 Malignant neoplasm of upper lobe, right bronchus or lung: Secondary | ICD-10-CM | POA: Insufficient documentation

## 2020-04-03 DIAGNOSIS — C3491 Malignant neoplasm of unspecified part of right bronchus or lung: Secondary | ICD-10-CM

## 2020-04-03 DIAGNOSIS — E119 Type 2 diabetes mellitus without complications: Secondary | ICD-10-CM | POA: Insufficient documentation

## 2020-04-03 DIAGNOSIS — I82409 Acute embolism and thrombosis of unspecified deep veins of unspecified lower extremity: Secondary | ICD-10-CM | POA: Insufficient documentation

## 2020-04-03 DIAGNOSIS — Z9221 Personal history of antineoplastic chemotherapy: Secondary | ICD-10-CM | POA: Insufficient documentation

## 2020-04-03 DIAGNOSIS — K5900 Constipation, unspecified: Secondary | ICD-10-CM | POA: Insufficient documentation

## 2020-04-03 DIAGNOSIS — C770 Secondary and unspecified malignant neoplasm of lymph nodes of head, face and neck: Secondary | ICD-10-CM | POA: Insufficient documentation

## 2020-04-03 DIAGNOSIS — Z79899 Other long term (current) drug therapy: Secondary | ICD-10-CM | POA: Insufficient documentation

## 2020-04-03 DIAGNOSIS — C7931 Secondary malignant neoplasm of brain: Secondary | ICD-10-CM

## 2020-04-03 DIAGNOSIS — Z7901 Long term (current) use of anticoagulants: Secondary | ICD-10-CM | POA: Diagnosis not present

## 2020-04-03 DIAGNOSIS — I10 Essential (primary) hypertension: Secondary | ICD-10-CM | POA: Insufficient documentation

## 2020-04-03 DIAGNOSIS — J45909 Unspecified asthma, uncomplicated: Secondary | ICD-10-CM | POA: Diagnosis not present

## 2020-04-03 DIAGNOSIS — E039 Hypothyroidism, unspecified: Secondary | ICD-10-CM

## 2020-04-03 DIAGNOSIS — Z5111 Encounter for antineoplastic chemotherapy: Secondary | ICD-10-CM

## 2020-04-03 DIAGNOSIS — Z923 Personal history of irradiation: Secondary | ICD-10-CM | POA: Diagnosis not present

## 2020-04-03 DIAGNOSIS — C3490 Malignant neoplasm of unspecified part of unspecified bronchus or lung: Secondary | ICD-10-CM

## 2020-04-03 LAB — CMP (CANCER CENTER ONLY)
ALT: 61 U/L — ABNORMAL HIGH (ref 0–44)
AST: 38 U/L (ref 15–41)
Albumin: 3.1 g/dL — ABNORMAL LOW (ref 3.5–5.0)
Alkaline Phosphatase: 152 U/L — ABNORMAL HIGH (ref 38–126)
Anion gap: 6 (ref 5–15)
BUN: 14 mg/dL (ref 6–20)
CO2: 25 mmol/L (ref 22–32)
Calcium: 9.2 mg/dL (ref 8.9–10.3)
Chloride: 108 mmol/L (ref 98–111)
Creatinine: 0.82 mg/dL (ref 0.61–1.24)
GFR, Est AFR Am: >60 mL/min (ref >60)
GFR, Estimated: >60 mL/min (ref >60)
Glucose, Bld: 83 mg/dL (ref 70–99)
Potassium: 3.8 mmol/L (ref 3.5–5.1)
Sodium: 139 mmol/L (ref 135–145)
Total Bilirubin: 0.6 mg/dL (ref 0.3–1.2)
Total Protein: 5.9 g/dL — ABNORMAL LOW (ref 6.5–8.1)

## 2020-04-03 LAB — CBC WITH DIFFERENTIAL (CANCER CENTER ONLY)
Abs Immature Granulocytes: 0.21 10*3/uL — ABNORMAL HIGH (ref 0.00–0.07)
Basophils Absolute: 0.2 10*3/uL — ABNORMAL HIGH (ref 0.0–0.1)
Basophils Relative: 2 %
Eosinophils Absolute: 0.4 10*3/uL (ref 0.0–0.5)
Eosinophils Relative: 5 %
HCT: 33.4 % — ABNORMAL LOW (ref 39.0–52.0)
Hemoglobin: 11.3 g/dL — ABNORMAL LOW (ref 13.0–17.0)
Immature Granulocytes: 2 %
Lymphocytes Relative: 7 %
Lymphs Abs: 0.7 10*3/uL (ref 0.7–4.0)
MCH: 31.5 pg (ref 26.0–34.0)
MCHC: 33.8 g/dL (ref 30.0–36.0)
MCV: 93 fL (ref 80.0–100.0)
Monocytes Absolute: 1.4 10*3/uL — ABNORMAL HIGH (ref 0.1–1.0)
Monocytes Relative: 14 %
Neutro Abs: 6.7 10*3/uL (ref 1.7–7.7)
Neutrophils Relative %: 70 %
Platelet Count: 305 10*3/uL (ref 150–400)
RBC: 3.59 MIL/uL — ABNORMAL LOW (ref 4.22–5.81)
RDW: 17.3 % — ABNORMAL HIGH (ref 11.5–15.5)
WBC Count: 9.7 10*3/uL (ref 4.0–10.5)
nRBC: 0 % (ref 0.0–0.2)

## 2020-04-03 LAB — TSH: TSH: 18.118 u[IU]/mL — ABNORMAL HIGH (ref 0.320–4.118)

## 2020-04-03 LAB — CK: Total CK: 133 U/L (ref 49–397)

## 2020-04-03 MED ORDER — LEVOTHYROXINE SODIUM 75 MCG PO TABS: 75.0000 ug | ORAL_TABLET | Freq: Every day | ORAL | 1 refills | Status: DC

## 2020-04-03 NOTE — Progress Notes (Signed)
[facility] Telephone:([phone]) [phone]   Fax:([phone]  OFFICE PROGRESS NOTE  [name], MD [place] [address]  DIAGNOSIS:  Metastatic non-small cell lung cancer initially diagnosed as stage IIIB (T1c, N3, M0)non-small cell lung cancer, adenocarcinoma presented with right upper lobe lung mass in addition to mediastinal and bilateral supraclavicular lymphadenopathy diagnosed in January 2020.  He had evidence of disease recurrence in June 2021 with adenopathy in the subcarinal, level 3 left neck lymph node, and lymph node at the curious of the diaphragm and in the upper abdomen.  PD-L1: 10%  Guardant 360 molecular studiesshowed no actionable mutation  Foundation One Testing:  Biomarker Findings Microsatellite status - MS-Stable Tumor Mutational Burden - 4 Muts/Mb Genomic Findings For a complete list of the genes assayed, please refer to the Appendix. ALK EML4-ALK fusion (Variant 2) SMARCB1 R377C CTNNB1 S45P CDKN2A/B CDKN2B loss, CDKN2A loss CXCR4 K025* KY70 splice site 623J>S 7 Disease relevant genes with no reportable alterations: BRAF, EGFR, ERBB2, KRAS, MET, RET, ROS1  PRIOR THERAPY: 1)Concurrent chemoradiation with weekly carboplatin for AUC of 2 and paclitaxel 45 mg/M2.Status post 6 cycles. Last dose was given on October 10, 2018 with stable disease. 2) Radiation treatment to the enlarging right cervical lymph nodes under the care of Dr. Sondra Come. First treatment 03/06/2019. Last treatment scheduledon9/05/2019 3)Consolidation treatment with immunotherapy with Imfinzi 10 mg/KG every 2 weeks. First dose November 17, 2018. Status post26cycles. 4) Systemic chemotherapy with carboplatin for an AUC of 5, Alimta 500 mg/m2, and Keytruda 200 mg IV every 3 weeks. First dose expected on 02/14/20. Status post 1 cycle.  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  CURRENT THERAPY:  [name] ([name]) 600 mg p.o. twice daily.  He started the first dose on March 08, 2020.   Status post 4 weeks of treatment.  INTERVAL HISTORY: Adrien Shankar 59 y.o. male returns to the clinic today for follow-up visit.  The patient is feeling fine today with no concerning complaints except for constipation and he is trying milk of magnesia.  He denied having any current chest pain but has mild cough with no hemoptysis, shortness of breath or chest pain.  He denied having any fever or chills.  He has no nausea, vomiting, diarrhea or abdominal pain.  He has no recent weight loss or night sweats.  He continues to tolerate his treatment with [name] fairly well.  Is here today for evaluation and repeat blood work.  MEDICAL HISTORY: Past Medical History:  Diagnosis Date  . Asthma   . Diabetes mellitus without complication ([place])   . Hypertension   . nscl ca dx'd 08/2018  . Subarachnoid hemorrhage (HCC)     ALLERGIES:  has No Known Allergies.  MEDICATIONS:  Current Outpatient Medications  Medication Sig Dispense Refill  . albuterol (VENTOLIN HFA) 108 (90 Base) MCG/ACT inhaler Inhale 2 puffs into the lungs every 4 (four) hours as needed for wheezing or shortness of breath. 6.7 g 5  . alectinib ([name]) 150 MG capsule Take 4 capsules (600 mg total) by mouth 2 (two) times daily with a meal. 240 capsule 2  . amLODipine (NORVASC) 5 MG tablet Take 5 mg by mouth daily.     Marland Kitchen atorvastatin (LIPITOR) 20 MG tablet Take 20 mg by mouth daily.     . benzonatate (TESSALON) 200 MG capsule Take one cap 2-3 times  a day for cough 60 capsule 2  . cholecalciferol (VITAMIN D3) 25 MCG (1000 UNIT) tablet Take 1,000 Units by mouth daily.    Marland Kitchen enoxaparin (LOVENOX) 120 MG/0.8ML injection INJECT 0.8 MLS (120 MG TOTAL) INTO THE SKIN DAILY. 24 mL 0  . folic acid (FOLVITE) 1 MG tablet Take 1 tablet (1 mg total) by mouth daily. 30 tablet 2  . levothyroxine (SYNTHROID) 50  MCG tablet Take 1 tablet (50 mcg total) by mouth daily before breakfast. 30 tablet 2  . metFORMIN (GLUCOPHAGE) 500 MG tablet Take 500 mg by mouth daily with breakfast.     . Respiratory Therapy Supplies (FLUTTER) DEVI Please use up 10 times daily(4-5 breaths, 4-5 times daily) 1 each 0  . TRELEGY ELLIPTA 100-62.5-25 MCG/INH AEPB Inhale 1 puff into the lungs daily.    Marland Kitchen venlafaxine (EFFEXOR) 37.5 MG tablet Take 37.5 mg by mouth daily.      No current facility-administered medications for this visit.    SURGICAL HISTORY:  Past Surgical History:  Procedure Laterality Date  . IR IVC FILTER PLMT / S&I /IMG GUID/MOD SED  03/04/2020  . LEG SURGERY  age 8    left leg, from fracture    REVIEW OF SYSTEMS:  A comprehensive review of systems was negative except for: Constitutional: positive for fatigue Respiratory: positive for cough Gastrointestinal: positive for constipation   PHYSICAL EXAMINATION: General appearance: alert, cooperative and no distress Head: Normocephalic, without obvious abnormality, atraumatic Neck: no adenopathy, no JVD, supple, symmetrical, trachea midline and thyroid not enlarged, symmetric, no tenderness/mass/nodules Lymph nodes: Cervical, supraclavicular, and axillary nodes normal. Resp: clear to auscultation bilaterally Back: symmetric, no curvature. ROM normal. No CVA tenderness. Cardio: regular rate and rhythm, S1, S2 normal, no murmur, click, rub or gallop GI: soft, non-tender; bowel sounds normal; no masses,  no organomegaly Extremities: extremities normal, atraumatic, no cyanosis or edema  ECOG PERFORMANCE STATUS: 1 - Symptomatic but completely ambulatory  Blood pressure 118/76, pulse 95, temperature 98.6 F (37 C), temperature source Tympanic, resp. rate 18, height $RemoveBe'5\' 8"'eKooYsebS$  (1.727 m), weight 179 lb (81.2 kg), SpO2 96 %.  LABORATORY DATA: Lab Results  Component Value Date   WBC 9.7 04/03/2020   HGB 11.3 (L) 04/03/2020   HCT 33.4 (L) 04/03/2020   MCV 93.0  04/03/2020   PLT 305 04/03/2020      Chemistry      Component Value Date/Time   NA 136 03/19/2020 0840   K 3.7 03/19/2020 0840   CL 106 03/19/2020 0840   CO2 24 03/19/2020 0840   BUN 13 03/19/2020 0840   CREATININE 0.93 03/19/2020 0840      Component Value Date/Time   CALCIUM 9.2 03/19/2020 0840   ALKPHOS 156 (H) 03/19/2020 0840   AST 45 (H) 03/19/2020 0840   ALT 78 (H) 03/19/2020 0840   BILITOT 0.5 03/19/2020 0840       RADIOGRAPHIC STUDIES: DG Chest 1 View  Result Date: 03/07/2020 CLINICAL DATA:  Right thoracentesis EXAM: CHEST  1 VIEW COMPARISON:  02/18/2020 FINDINGS: Stable volume loss and reticular opacity at the right lung with band of collapse about the hilum. The left lung is clear. Normal heart size and aortic contours. IVC filter in place. Trace if any residual pleural fluid at the lateral costophrenic sulcus. IMPRESSION: Stable chest.  No acute finding after thoracentesis. Electronically Signed   By: Monte Fantasia M.D.   On: 03/07/2020 09:59   DG Chest 2 View  Result Date: 03/22/2020 CLINICAL DATA:  Lung  cancer.  Recent thoracentesis. EXAM: CHEST - 2 VIEW COMPARISON:  Chest x-ray dated March 07, 2020. FINDINGS: The heart size and mediastinal contours are within normal limits. Normal pulmonary vascularity. Stable volume loss in the right lung with distortion and scarring around the right hilum. Unchanged reticulonodular opacity in the right lower lobe. The left lung is clear. No pleural effusion or pneumothorax. IVC filter again noted. No acute osseous abnormality. IMPRESSION: 1. No recurrent pleural effusion. Electronically Signed   By: Titus Dubin M.D.   On: 03/22/2020 15:12   US Thoracentesis Asp Pleural space w/IMG guide  Result Date: 03/07/2020 INDICATION: Patient with a history of right lung cancer and right pleural effusion. Interventional radiology asked to perform a therapeutic and diagnostic thoracentesis. EXAM: ULTRASOUND GUIDED THORACENTESIS MEDICATIONS:  1% lidocaine 10 mL COMPLICATIONS: None immediate PROCEDURE: An ultrasound guided thoracentesis was thoroughly discussed with the patient and questions answered. The benefits, risks, alternatives and complications were also discussed. The patient understands and wishes to proceed with the procedure. Written consent was obtained. Ultrasound was performed to localize and mark an adequate pocket of fluid in the right chest. The area was then prepped and draped in the normal sterile fashion. 1% Lidocaine was used for local anesthesia. Under ultrasound guidance a 6 Fr Safe-T-Centesis catheter was introduced. Thoracentesis was performed. The catheter was removed and a dressing applied. FINDINGS: A total of approximately 450 mL of light red fluid was removed. Samples were sent to the laboratory as requested by the clinical team. IMPRESSION: Successful ultrasound guided right thoracentesis yielding 450 mL of pleural fluid. Read by: Soyla Dryer, NP Electronically Signed   By: Jacqulynn Cadet M.D.   On: 03/07/2020 10:08    ASSESSMENT AND PLAN: This is a very pleasant 59 years old white male with metastatic non-small cell lung cancer, adenocarcinoma now with ALK gene translocation.  The patient was initially diagnosed as a stage IIIa non-small cell lung cancer status post a course of concurrent chemoradiation with weekly carboplatin and paclitaxel followed by 1 year of consolidation treatment with immunotherapy with Imfinzi. He had evidence for disease progression recently. Repeat tissue biopsy from the left supraclavicular lymph nodes and molecular studies showed that the patient has positive ALK gene translocation.  His previous molecular studies by Guardant 360 was negative. I had a lengthy discussion with the patient today about his condition and treatment options. He received 1 cycle of systemic chemotherapy with carboplatin, Alimta and Keytruda.  We will discontinue his systemic chemotherapy for now because  of the new findings on the molecular studies. The patient is currently on treatment with [name] 600 mg p.o. twice daily.  Status post 4 weeks of treatment.  The patient continues to tolerate this treatment well with no concerning adverse effects. I recommended for him to continue his current treatment with [name] with the same dose. I will see him back for follow-up visit in 1 months for evaluation with repeat CT scan of the chest, abdomen pelvis for restaging of his disease. For the constipation he will continue with Dulcolax and milk of magnesia. For the abdominal hernia the patient was referred to general surgery but they have not called him with the appointment yet The patient was advised to call immediately if he has any concerning symptoms in the interval. For the current deep venous thrombosis, he will continue treatment with Lovenox today and the patient had IVC filter placed yesterday.  The patient voices understanding of current disease status and treatment options and is in  agreement with the current care plan.  All questions were answered. The patient knows to call the clinic with any problems, questions or concerns. We can certainly see the patient much sooner if necessary.  Disclaimer: This note was dictated with voice recognition software. Similar sounding words can inadvertently be transcribed and may not be corrected upon review.

## 2020-04-04 ENCOUNTER — Encounter: Payer: Self-pay | Admitting: Internal Medicine

## 2020-04-04 ENCOUNTER — Other Ambulatory Visit: Payer: Self-pay | Admitting: Medical Oncology

## 2020-04-04 ENCOUNTER — Telehealth: Payer: Self-pay | Admitting: Medical Oncology

## 2020-04-04 DIAGNOSIS — C3491 Malignant neoplasm of unspecified part of right bronchus or lung: Secondary | ICD-10-CM

## 2020-04-04 MED ORDER — ALECENSA 150 MG PO CAPS: 600.0000 mg | ORAL_CAPSULE | Freq: Two times a day (BID) | ORAL | 0 refills | Status: DC

## 2020-04-04 NOTE — Telephone Encounter (Signed)
Requests 90 day supply for Alcensa.- Rx sent to [facility].

## 2020-04-09 ENCOUNTER — Telehealth: Payer: Self-pay | Admitting: Radiation Oncology

## 2020-04-09 ENCOUNTER — Telehealth: Payer: Self-pay | Admitting: Internal Medicine

## 2020-04-09 NOTE — Telephone Encounter (Signed)
Scheduled per los. Called and spoke with patient. Confirmed appt 

## 2020-04-12 DIAGNOSIS — E782 Mixed hyperlipidemia: Secondary | ICD-10-CM | POA: Diagnosis not present

## 2020-04-12 DIAGNOSIS — I10 Essential (primary) hypertension: Secondary | ICD-10-CM | POA: Diagnosis not present

## 2020-04-12 DIAGNOSIS — Z23 Encounter for immunization: Secondary | ICD-10-CM | POA: Diagnosis not present

## 2020-04-12 DIAGNOSIS — E1165 Type 2 diabetes mellitus with hyperglycemia: Secondary | ICD-10-CM | POA: Diagnosis not present

## 2020-04-12 DIAGNOSIS — J439 Emphysema, unspecified: Secondary | ICD-10-CM | POA: Diagnosis not present

## 2020-04-21 ENCOUNTER — Other Ambulatory Visit: Payer: Self-pay | Admitting: Physician Assistant

## 2020-04-21 DIAGNOSIS — C3491 Malignant neoplasm of unspecified part of right bronchus or lung: Secondary | ICD-10-CM

## 2020-04-29 ENCOUNTER — Other Ambulatory Visit: Payer: Self-pay | Admitting: Physician Assistant

## 2020-04-29 ENCOUNTER — Other Ambulatory Visit: Payer: Self-pay | Admitting: Medical

## 2020-04-29 DIAGNOSIS — I8290 Acute embolism and thrombosis of unspecified vein: Secondary | ICD-10-CM

## 2020-04-29 DIAGNOSIS — R21 Rash and other nonspecific skin eruption: Secondary | ICD-10-CM

## 2020-04-29 DIAGNOSIS — E039 Hypothyroidism, unspecified: Secondary | ICD-10-CM

## 2020-04-29 MED ORDER — ENOXAPARIN SODIUM 120 MG/0.8ML ~~LOC~~ SOLN: 120.0000 mg | SUBCUTANEOUS | 0 refills | Status: DC

## 2020-05-02 ENCOUNTER — Telehealth: Payer: Self-pay | Admitting: Medical Oncology

## 2020-05-02 ENCOUNTER — Encounter: Payer: Self-pay | Admitting: Radiation Oncology

## 2020-05-02 ENCOUNTER — Ambulatory Visit (HOSPITAL_COMMUNITY): Admission: RE | Admit: 2020-05-02 | Payer: BC Managed Care – PPO | Source: Ambulatory Visit

## 2020-05-02 ENCOUNTER — Ambulatory Visit
Admission: RE | Admit: 2020-05-02 | Discharge: 2020-05-02 | Disposition: A | Discharge status: Home / Self Care | Payer: BC Managed Care – PPO | Source: Ambulatory Visit | Attending: Radiation Oncology | Admitting: Radiation Oncology

## 2020-05-02 ENCOUNTER — Other Ambulatory Visit: Payer: Self-pay

## 2020-05-02 DIAGNOSIS — C719 Malignant neoplasm of brain, unspecified: Secondary | ICD-10-CM | POA: Diagnosis not present

## 2020-05-02 DIAGNOSIS — C7931 Secondary malignant neoplasm of brain: Secondary | ICD-10-CM

## 2020-05-02 MED ORDER — GADOBENATE DIMEGLUMINE 529 MG/ML IV SOLN
15.0000 mL | Freq: Once | INTRAVENOUS | Status: AC | PRN
  Administered 2020-05-02: 15 mL via INTRAVENOUS

## 2020-05-02 NOTE — Telephone Encounter (Signed)
LVM for pt to call [phone] to schedule his CT and call me back with date and time. I also told him we will need to r/s [facility] appt if he cannot get CT tomorrow. CT r/s.

## 2020-05-03 ENCOUNTER — Other Ambulatory Visit: Payer: Self-pay

## 2020-05-03 ENCOUNTER — Inpatient Hospital Stay: Payer: BC Managed Care – PPO | Attending: Internal Medicine

## 2020-05-03 ENCOUNTER — Ambulatory Visit (HOSPITAL_COMMUNITY)
Admission: RE | Admit: 2020-05-03 | Discharge: 2020-05-03 | Disposition: A | Discharge status: Home / Self Care | Payer: BC Managed Care – PPO | Source: Ambulatory Visit | Attending: Internal Medicine | Admitting: Internal Medicine

## 2020-05-03 ENCOUNTER — Inpatient Hospital Stay: Payer: BC Managed Care – PPO

## 2020-05-03 DIAGNOSIS — Z7901 Long term (current) use of anticoagulants: Secondary | ICD-10-CM | POA: Diagnosis not present

## 2020-05-03 DIAGNOSIS — J90 Pleural effusion, not elsewhere classified: Secondary | ICD-10-CM | POA: Diagnosis not present

## 2020-05-03 DIAGNOSIS — E039 Hypothyroidism, unspecified: Secondary | ICD-10-CM | POA: Diagnosis not present

## 2020-05-03 DIAGNOSIS — J45909 Unspecified asthma, uncomplicated: Secondary | ICD-10-CM | POA: Insufficient documentation

## 2020-05-03 DIAGNOSIS — C770 Secondary and unspecified malignant neoplasm of lymph nodes of head, face and neck: Secondary | ICD-10-CM | POA: Diagnosis not present

## 2020-05-03 DIAGNOSIS — K4090 Unilateral inguinal hernia, without obstruction or gangrene, not specified as recurrent: Secondary | ICD-10-CM | POA: Diagnosis not present

## 2020-05-03 DIAGNOSIS — K449 Diaphragmatic hernia without obstruction or gangrene: Secondary | ICD-10-CM | POA: Diagnosis not present

## 2020-05-03 DIAGNOSIS — I10 Essential (primary) hypertension: Secondary | ICD-10-CM | POA: Diagnosis not present

## 2020-05-03 DIAGNOSIS — C3411 Malignant neoplasm of upper lobe, right bronchus or lung: Secondary | ICD-10-CM | POA: Insufficient documentation

## 2020-05-03 DIAGNOSIS — C3491 Malignant neoplasm of unspecified part of right bronchus or lung: Secondary | ICD-10-CM

## 2020-05-03 DIAGNOSIS — C3490 Malignant neoplasm of unspecified part of unspecified bronchus or lung: Secondary | ICD-10-CM

## 2020-05-03 DIAGNOSIS — J449 Chronic obstructive pulmonary disease, unspecified: Secondary | ICD-10-CM | POA: Diagnosis not present

## 2020-05-03 DIAGNOSIS — Z7984 Long term (current) use of oral hypoglycemic drugs: Secondary | ICD-10-CM | POA: Diagnosis not present

## 2020-05-03 DIAGNOSIS — I700 Atherosclerosis of aorta: Secondary | ICD-10-CM | POA: Diagnosis not present

## 2020-05-03 DIAGNOSIS — K4020 Bilateral inguinal hernia, without obstruction or gangrene, not specified as recurrent: Secondary | ICD-10-CM | POA: Diagnosis not present

## 2020-05-03 DIAGNOSIS — Z79899 Other long term (current) drug therapy: Secondary | ICD-10-CM | POA: Diagnosis not present

## 2020-05-03 DIAGNOSIS — E119 Type 2 diabetes mellitus without complications: Secondary | ICD-10-CM | POA: Diagnosis not present

## 2020-05-03 LAB — CMP (CANCER CENTER ONLY)
ALT: 36 U/L (ref 0–44)
AST: 30 U/L (ref 15–41)
Albumin: 3.4 g/dL — ABNORMAL LOW (ref 3.5–5.0)
Alkaline Phosphatase: 205 U/L — ABNORMAL HIGH (ref 38–126)
Anion gap: 7 (ref 5–15)
BUN: 13 mg/dL (ref 6–20)
CO2: 25 mmol/L (ref 22–32)
Calcium: 8.9 mg/dL (ref 8.9–10.3)
Chloride: 105 mmol/L (ref 98–111)
Creatinine: 0.99 mg/dL (ref 0.61–1.24)
GFR, Est AFR Am: >60 mL/min (ref >60)
GFR, Estimated: >60 mL/min (ref >60)
Glucose, Bld: 88 mg/dL (ref 70–99)
Potassium: 3.8 mmol/L (ref 3.5–5.1)
Sodium: 137 mmol/L (ref 135–145)
Total Bilirubin: 0.7 mg/dL (ref 0.3–1.2)
Total Protein: 6.3 g/dL — ABNORMAL LOW (ref 6.5–8.1)

## 2020-05-03 LAB — CBC WITH DIFFERENTIAL (CANCER CENTER ONLY)
Abs Immature Granulocytes: 0.17 10*3/uL — ABNORMAL HIGH (ref 0.00–0.07)
Basophils Absolute: 0.1 10*3/uL (ref 0.0–0.1)
Basophils Relative: 1 %
Eosinophils Absolute: 0.3 10*3/uL (ref 0.0–0.5)
Eosinophils Relative: 3 %
HCT: 35.3 % — ABNORMAL LOW (ref 39.0–52.0)
Hemoglobin: 11.6 g/dL — ABNORMAL LOW (ref 13.0–17.0)
Immature Granulocytes: 2 %
Lymphocytes Relative: 8 %
Lymphs Abs: 0.7 10*3/uL (ref 0.7–4.0)
MCH: 31.4 pg (ref 26.0–34.0)
MCHC: 32.9 g/dL (ref 30.0–36.0)
MCV: 95.7 fL (ref 80.0–100.0)
Monocytes Absolute: 1.3 10*3/uL — ABNORMAL HIGH (ref 0.1–1.0)
Monocytes Relative: 14 %
Neutro Abs: 6.6 10*3/uL (ref 1.7–7.7)
Neutrophils Relative %: 72 %
Platelet Count: 283 10*3/uL (ref 150–400)
RBC: 3.69 MIL/uL — ABNORMAL LOW (ref 4.22–5.81)
RDW: 16.9 % — ABNORMAL HIGH (ref 11.5–15.5)
WBC Count: 9.2 10*3/uL (ref 4.0–10.5)
nRBC: 0 % (ref 0.0–0.2)

## 2020-05-03 LAB — CK: Total CK: 98 U/L (ref 49–397)

## 2020-05-03 MED ORDER — IOHEXOL 300 MG/ML  SOLN
100.0000 mL | Freq: Once | INTRAMUSCULAR | Status: AC | PRN
  Administered 2020-05-03: 100 mL via INTRAVENOUS

## 2020-05-03 MED ORDER — PROCHLORPERAZINE MALEATE 10 MG PO TABS: 10.0000 mg | ORAL_TABLET | Freq: Four times a day (QID) | ORAL | 2 refills | Status: DC | PRN

## 2020-05-03 NOTE — Telephone Encounter (Signed)
Pt has requested refill on Prochlorperazine 10mg  Tab. One tab q6h PRN.

## 2020-05-04 ENCOUNTER — Other Ambulatory Visit: Payer: Self-pay | Admitting: Medical

## 2020-05-04 DIAGNOSIS — R21 Rash and other nonspecific skin eruption: Secondary | ICD-10-CM

## 2020-05-06 ENCOUNTER — Inpatient Hospital Stay (HOSPITAL_BASED_OUTPATIENT_CLINIC_OR_DEPARTMENT_OTHER): Payer: BC Managed Care – PPO | Admitting: Internal Medicine

## 2020-05-06 ENCOUNTER — Encounter: Payer: Self-pay | Admitting: Internal Medicine

## 2020-05-06 ENCOUNTER — Other Ambulatory Visit: Payer: Self-pay

## 2020-05-06 ENCOUNTER — Inpatient Hospital Stay: Payer: BC Managed Care – PPO

## 2020-05-06 ENCOUNTER — Other Ambulatory Visit: Payer: Self-pay | Admitting: Physician Assistant

## 2020-05-06 ENCOUNTER — Ambulatory Visit
Admission: RE | Admit: 2020-05-06 | Discharge: 2020-05-06 | Disposition: A | Discharge status: Home / Self Care | Payer: BC Managed Care – PPO | Source: Ambulatory Visit | Attending: Radiation Oncology | Admitting: Radiation Oncology

## 2020-05-06 VITALS — BP 114/74 | HR 83 | Temp 98.8°F | Resp 17 | Ht 68.0 in | Wt 177.2 lb

## 2020-05-06 DIAGNOSIS — Z7901 Long term (current) use of anticoagulants: Secondary | ICD-10-CM | POA: Diagnosis not present

## 2020-05-06 DIAGNOSIS — C3411 Malignant neoplasm of upper lobe, right bronchus or lung: Secondary | ICD-10-CM | POA: Diagnosis not present

## 2020-05-06 DIAGNOSIS — E039 Hypothyroidism, unspecified: Secondary | ICD-10-CM | POA: Diagnosis not present

## 2020-05-06 DIAGNOSIS — R053 Chronic cough: Secondary | ICD-10-CM

## 2020-05-06 DIAGNOSIS — Z7984 Long term (current) use of oral hypoglycemic drugs: Secondary | ICD-10-CM | POA: Diagnosis not present

## 2020-05-06 DIAGNOSIS — I10 Essential (primary) hypertension: Secondary | ICD-10-CM | POA: Diagnosis not present

## 2020-05-06 DIAGNOSIS — J45909 Unspecified asthma, uncomplicated: Secondary | ICD-10-CM | POA: Diagnosis not present

## 2020-05-06 DIAGNOSIS — C3491 Malignant neoplasm of unspecified part of right bronchus or lung: Secondary | ICD-10-CM

## 2020-05-06 DIAGNOSIS — Z5111 Encounter for antineoplastic chemotherapy: Secondary | ICD-10-CM | POA: Diagnosis not present

## 2020-05-06 DIAGNOSIS — C7931 Secondary malignant neoplasm of brain: Secondary | ICD-10-CM

## 2020-05-06 DIAGNOSIS — L598 Other specified disorders of the skin and subcutaneous tissue related to radiation: Secondary | ICD-10-CM | POA: Diagnosis not present

## 2020-05-06 DIAGNOSIS — Z85118 Personal history of other malignant neoplasm of bronchus and lung: Secondary | ICD-10-CM | POA: Diagnosis not present

## 2020-05-06 DIAGNOSIS — Z79899 Other long term (current) drug therapy: Secondary | ICD-10-CM | POA: Diagnosis not present

## 2020-05-06 DIAGNOSIS — Z08 Encounter for follow-up examination after completed treatment for malignant neoplasm: Secondary | ICD-10-CM | POA: Diagnosis not present

## 2020-05-06 DIAGNOSIS — C770 Secondary and unspecified malignant neoplasm of lymph nodes of head, face and neck: Secondary | ICD-10-CM | POA: Diagnosis not present

## 2020-05-06 LAB — TSH: TSH: 16.712 u[IU]/mL — ABNORMAL HIGH (ref 0.320–4.118)

## 2020-05-06 MED ORDER — LEVOTHYROXINE SODIUM 100 MCG PO TABS: 100.0000 ug | ORAL_TABLET | Freq: Every day | ORAL | 0 refills | Status: DC

## 2020-05-06 NOTE — Progress Notes (Signed)
Radiation Oncology         [phone] ________________________________  Outpatient Follow Up - Conducted via telephone due to current COVID-19 concerns for limiting patient exposure  I spoke with the patient to conduct this consult visit via telephone to spare the patient unnecessary potential exposure in the healthcare setting during the current COVID-19 pandemic. The patient was notified in advance and was offered a [place] meeting to allow for face to face communication but unfortunately reported that they did not have the appropriate resources/technology to support such a visit and instead preferred to proceed with a telephone visit.    Name: Brian [name]        MRN: [id number]  Date of Service: 05/06/2020 DOB: Apr 23, 1961  DX:AJOINOM, [name], MD  [name], MD     REFERRING PHYSICIAN: [name], MD   DIAGNOSIS: The encounter diagnosis was Secondary malignant neoplasm of brain and spinal cord [facility]).   HISTORY OF PRESENT ILLNESS: Brian [name] is a 59 y.o. male with a history of Stage IIIB, cT1cN3M0, NSCLC, adenocarcinoma in January 2020.  He was treated with chemoradiation under the care of Dr. Julien Nordmann and Dr. Sondra Come.  He was found to have progressive disease in the summer 2020 in the right cervical lymph node chain and was treated with definitive radiotherapy.  He received consolidative immunotherapy.  Unfortunately recent imaging showed recurrent disease in the left cervical lymph nodes, and biopsy on 02/16/2020 confirmed the diagnosis.  He was ready to proceed with systemic chemotherapy, and an MRI brain on 02/21/20 for restaging revealed a 5 mm right lateral temporal lobe metastasis. A 3T MRI scan of the brain with and without contrast on 03/01/2020 showed the 5 mm metastasis in the right lateral temporal lobe, a 2 mm metastasis in the left posterior frontal cortex, and a 3 mm metastasis along the undersurface of the right thalamus.  Small vessel ischemic changes were  also noted within the hemispheric white matter and were stable. He met with our team and Dr. Lisbeth Renshaw offered [facility] treatment to these three lesions. The patient was prepared to proceed, but couldn't tolerate the mask for simulation and declined to proceed. We reviewed the rationale to repeat imaging, and this was performed with MRI brain on 05/02/20 which showed no visible lesions of concern that had been seen prior. His case was discussed in multidisciplinary brain oncology conference this morning and he's contacted today to review this. He continues with Dr. Julien Nordmann and sees him this afternoon to review his most recent CT scan and plans for continued therapy.  PREVIOUS RADIATION THERAPY: Yes  03/06/2019-04/13/2019: The right cervical node chain was treated to 60 Gy in 30 fractions  09/05/2018-10/14/2018:   The right lung target was treated to 60 Gy in 30 fractions.   PAST MEDICAL HISTORY:  Past Medical History:  Diagnosis Date  . Asthma   . Diabetes mellitus without complication ([place])   . Hypertension   . nscl ca dx'd 08/2018  . Subarachnoid hemorrhage ([place])        PAST SURGICAL HISTORY: Past Surgical History:  Procedure Laterality Date  . IR IVC FILTER PLMT / S&I /IMG GUID/MOD SED  03/04/2020  . LEG SURGERY  age 22    left leg, from fracture     FAMILY HISTORY:  Family History  Problem Relation Age of Onset  . Colon cancer Neg Hx   . Stomach cancer Neg Hx      SOCIAL HISTORY:  reports that he has never  smoked. He has never used smokeless tobacco. He reports current alcohol use of about 5.0 standard drinks of alcohol per week. He reports that he does not use drugs.  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   ALLERGIES: Patient has no known allergies.   MEDICATIONS:  Current Outpatient Medications  Medication Sig Dispense Refill  . albuterol (VENTOLIN HFA) 108 (90 Base) MCG/ACT  inhaler Inhale 2 puffs into the lungs every 4 (four) hours as needed for wheezing or shortness of breath. 6.7 g 5  . alectinib ([name]) 150 MG capsule Take 4 capsules (600 mg total) by mouth 2 (two) times daily with a meal. 720 capsule 0  . amLODipine (NORVASC) 5 MG tablet Take 5 mg by mouth daily.     Marland Kitchen atorvastatin (LIPITOR) 20 MG tablet Take 20 mg by mouth daily.     . benzonatate (TESSALON) 200 MG capsule Take one cap 2-3 times a day for cough 60 capsule 2  . cholecalciferol (VITAMIN D3) 25 MCG (1000 UNIT) tablet Take 1,000 Units by mouth daily.    Marland Kitchen enoxaparin (LOVENOX) 120 MG/0.8ML injection Inject 0.8 mLs (120 mg total) into the skin daily. 24 mL 0  . folic acid (FOLVITE) 1 MG tablet Take 1 tablet (1 mg total) by mouth daily. 30 tablet 2  . levothyroxine (SYNTHROID) 75 MCG tablet Take 1 tablet (75 mcg total) by mouth daily before breakfast. 90 tablet 1  . metFORMIN (GLUCOPHAGE) 500 MG tablet Take 500 mg by mouth daily with breakfast.     . Respiratory Therapy Supplies (FLUTTER) DEVI Please use up 10 times daily(4-5 breaths, 4-5 times daily) 1 each 0  . TRELEGY ELLIPTA 100-62.5-25 MCG/INH AEPB Inhale 1 puff into the lungs daily.    Marland Kitchen venlafaxine (EFFEXOR) 37.5 MG tablet Take 37.5 mg by mouth daily.     Marland Kitchen COVID-19 Specimen Collection KIT See admin instructions. follow package directions (Patient not taking: Reported on 05/02/2020)    . prochlorperazine (COMPAZINE) 10 MG tablet Take 1 tablet (10 mg total) by mouth every 6 (six) hours as needed. 30 tablet 2   No current facility-administered medications for this encounter.     REVIEW OF SYSTEMS: No specific complaints or concerns were verbalized.    PHYSICAL EXAM:  Unable to assess due to encounter type.  ECOG = 0  0 - Asymptomatic (Fully active, able to carry on all predisease activities without restriction)  1 - Symptomatic but completely ambulatory (Restricted in physically strenuous activity but ambulatory and able to carry out  work of a light or sedentary nature. For example, light housework, office work)  2 - Symptomatic, <50% in bed during the day (Ambulatory and capable of all self care but unable to carry out any work activities. Up and about more than 50% of waking hours)  3 - Symptomatic, >50% in bed, but not bedbound (Capable of only limited self-care, confined to bed or chair 50% or more of waking hours)  4 - Bedbound (Completely disabled. Cannot carry on any self-care. Totally confined to bed or chair)  5 - Death   Eustace Pen MM, Creech RH, Tormey DC, et al. [phone]). "Toxicity and response criteria of the [facility]". [place]. 5 (6): 649-55    LABORATORY DATA:  Lab Results  Component Value Date   WBC 9.2 05/03/2020   HGB 11.6 (L) 05/03/2020   HCT  35.3 (L) 05/03/2020   MCV 95.7 05/03/2020   PLT 283 05/03/2020   Lab Results  Component Value Date   NA 137 05/03/2020   K 3.8 05/03/2020   CL 105 05/03/2020   CO2 25 05/03/2020   Lab Results  Component Value Date   ALT 36 05/03/2020   AST 30 05/03/2020   ALKPHOS 205 (H) 05/03/2020   BILITOT 0.7 05/03/2020      RADIOGRAPHY: CT Chest W Contrast  Result Date: 05/03/2020 CLINICAL DATA:  Non-small cell lung cancer staging. History of prior radiotherapy to the neck and chest, also post chemo and immunotherapy. EXAM: CT CHEST, ABDOMEN, AND PELVIS WITH CONTRAST TECHNIQUE: Multidetector CT imaging of the chest, abdomen and pelvis was performed following the standard protocol during bolus administration of intravenous contrast. CONTRAST:  134m OMNIPAQUE IOHEXOL 300 MG/ML  SOLN COMPARISON:  CT angiography of the chest from July of 2021, prior PET scans from July and CT scans from June of 2021. FINDINGS: CT CHEST FINDINGS Cardiovascular: Normal caliber thoracic aorta. Normal heart size without pericardial effusion. Central pulmonary vessels are unremarkable on venous phase assessment aside from post treatment changes about the  RIGHT hilum Mediastinum/Nodes: Vascular structures are normal. Esophagus is patulous mildly thickened. No axillary lymphadenopathy. No mediastinal lymphadenopathy. No hilar adenopathy. Soft tissue and RIGHT hilum as on previous imaging. No discrete lymph node in this location. Lungs/Pleura: Since the CT of June of 2021 there has been development of an effusion which was present on the study of July of 2021. This effusion is slightly increased compared to the July study. This measures approximately 5 cm greatest depth on today's exam previously approximately 3.9 cm. Consolidative changes and post treatment effect about the RIGHT hilum with similar appearance. Nodular focus in the RIGHT upper lobe, discrete nodule on the prior study has nearly completely resolved only ground-glass attenuation remaining in the the RIGHT upper lobe at the site of previous ill-defined nodules. Septal thickening in this area is subjectively improved. Septal thickening in aerated RIGHT lower lobe also with subjective improvement. RIGHT lower lobe nodules seen on previous imaging now with only ground-glass attenuation. At the site of the largest area in the RIGHT lower lobe there is consolidative change in the lung and ground-glass without measurable nodule. There is however a new small solid nodule at the LEFT lung base measuring 7 mm (image 127, series 4) Above findings also with dramatic improvement with respect to septal thickening as compared to the study of February 02, 2020 and with respect to discrete nodules with solid characteristics that were seen on previous imaging. Musculoskeletal: No discrete chest wall mass. See below for full musculoskeletal detail. CT ABDOMEN PELVIS FINDINGS Hepatobiliary: No focal, suspicious hepatic lesion. No pericholecystic stranding. No biliary duct dilation. Pancreas: Pancreas without ductal dilation or inflammation. Spleen: Spleen normal in size and contour. Adrenals/Urinary Tract: Adrenal glands are  normal. Symmetric renal enhancement. No hydronephrosis. Urinary bladder is normal. No suspicious renal lesion. Stable low-density lesion in the upper pole LEFT kidney compatible with cysts. Stomach/Bowel: Small hiatal hernia. Mild esophageal thickening throughout and patulous esophagus as described previously. No acute small bowel process. No bowel obstruction. Appendix is normal. Stool throughout much of the colon. Vascular/Lymphatic: Calcified and noncalcified plaque, mild in the abdominal aorta. No aneurysmal dilation. IVC filter in place, tip just at or below level of renal venous confluence. There is no gastrohepatic or hepatoduodenal ligament lymphadenopathy. No retroperitoneal or mesenteric lymphadenopathy. No pelvic sidewall lymphadenopathy. Reproductive: Prostate unremarkable by CT. Other:  Large fat containing RIGHT inguinal hernia with incipient herniation of small bowel loops in the RIGHT lower quadrant. Moderate fat containing LEFT inguinal hernia. Musculoskeletal: No acute musculoskeletal process. No destructive bone finding. IMPRESSION: 1. Similar appearance of background post treatment changes in the RIGHT chest with marked interval improvement of irregular septal thickening and resolution of solid nodules now with only faint ground-glass scattered throughout the RIGHT chest. Compatible with interval response to therapy. 2. Increased RIGHT pleural effusion is however noted on today's study. No nodular enhancing component, attention on follow-up. 3. New 7 mm solid nodule at the LEFT lung base. This is suspicious for new site of disease. 4. Decreased conspicuity of small lymph nodes which were FDG avid in the RIGHT axilla on the previous study also with diminished size of nodal tissue near the LEFT diaphragmatic crus. 5. No evidence of metastatic disease to the abdomen or pelvis. 6. Large fat containing RIGHT inguinal hernia with incipient herniation of small bowel loops in the RIGHT lower quadrant. No  acute small bowel process. 7. Aortic atherosclerosis. Aortic Atherosclerosis (ICD10-I70.0). Electronically Signed   By: Zetta Bills M.D.   On: 05/03/2020 17:04   MR Brain W Wo Contrast  Result Date: 05/02/2020 CLINICAL DATA:  Brain/CNS neoplasm, surveillance. EXAM: MRI HEAD WITHOUT AND WITH CONTRAST TECHNIQUE: Multiplanar, multiecho pulse sequences of the brain and surrounding structures were obtained without and with intravenous contrast. CONTRAST:  78m MULTIHANCE GADOBENATE DIMEGLUMINE 529 MG/ML IV SOLN COMPARISON:  None. FINDINGS: Brain: No acute infarction, hemorrhage, hydrocephalus or extra-axial collection. Few scattered foci of T2 hyperintensity are seen within the white matter of the cerebral hemispheres and within the pons, nonspecific, most likely related to early microangiopathic changes. Previously described foci of contrast enhancement are no longer appreciated. No new focus of abnormal contrast enhancement identified. Vascular: Normal flow voids. Skull and upper cervical spine: Normal marrow signal. Sinuses/Orbits: Negative. Other: None. IMPRESSION: 1. No evidence of intracranial metastatic disease. 2. Mild chronic microangiopathic changes. Electronically Signed   By: KPedro EarlsM.D.   On: 05/02/2020 15:22   CT Abdomen Pelvis W Contrast  Result Date: 05/03/2020 CLINICAL DATA:  Non-small cell lung cancer staging. History of prior radiotherapy to the neck and chest, also post chemo and immunotherapy. EXAM: CT CHEST, ABDOMEN, AND PELVIS WITH CONTRAST TECHNIQUE: Multidetector CT imaging of the chest, abdomen and pelvis was performed following the standard protocol during bolus administration of intravenous contrast. CONTRAST:  1030mOMNIPAQUE IOHEXOL 300 MG/ML  SOLN COMPARISON:  CT angiography of the chest from July of 2021, prior PET scans from July and CT scans from June of 2021. FINDINGS: CT CHEST FINDINGS Cardiovascular: Normal caliber thoracic aorta. Normal heart size  without pericardial effusion. Central pulmonary vessels are unremarkable on venous phase assessment aside from post treatment changes about the RIGHT hilum Mediastinum/Nodes: Vascular structures are normal. Esophagus is patulous mildly thickened. No axillary lymphadenopathy. No mediastinal lymphadenopathy. No hilar adenopathy. Soft tissue and RIGHT hilum as on previous imaging. No discrete lymph node in this location. Lungs/Pleura: Since the CT of June of 2021 there has been development of an effusion which was present on the study of July of 2021. This effusion is slightly increased compared to the July study. This measures approximately 5 cm greatest depth on today's exam previously approximately 3.9 cm. Consolidative changes and post treatment effect about the RIGHT hilum with similar appearance. Nodular focus in the RIGHT upper lobe, discrete nodule on the prior study has nearly completely resolved only ground-glass  attenuation remaining in the the RIGHT upper lobe at the site of previous ill-defined nodules. Septal thickening in this area is subjectively improved. Septal thickening in aerated RIGHT lower lobe also with subjective improvement. RIGHT lower lobe nodules seen on previous imaging now with only ground-glass attenuation. At the site of the largest area in the RIGHT lower lobe there is consolidative change in the lung and ground-glass without measurable nodule. There is however a new small solid nodule at the LEFT lung base measuring 7 mm (image 127, series 4) Above findings also with dramatic improvement with respect to septal thickening as compared to the study of February 02, 2020 and with respect to discrete nodules with solid characteristics that were seen on previous imaging. Musculoskeletal: No discrete chest wall mass. See below for full musculoskeletal detail. CT ABDOMEN PELVIS FINDINGS Hepatobiliary: No focal, suspicious hepatic lesion. No pericholecystic stranding. No biliary duct dilation.  Pancreas: Pancreas without ductal dilation or inflammation. Spleen: Spleen normal in size and contour. Adrenals/Urinary Tract: Adrenal glands are normal. Symmetric renal enhancement. No hydronephrosis. Urinary bladder is normal. No suspicious renal lesion. Stable low-density lesion in the upper pole LEFT kidney compatible with cysts. Stomach/Bowel: Small hiatal hernia. Mild esophageal thickening throughout and patulous esophagus as described previously. No acute small bowel process. No bowel obstruction. Appendix is normal. Stool throughout much of the colon. Vascular/Lymphatic: Calcified and noncalcified plaque, mild in the abdominal aorta. No aneurysmal dilation. IVC filter in place, tip just at or below level of renal venous confluence. There is no gastrohepatic or hepatoduodenal ligament lymphadenopathy. No retroperitoneal or mesenteric lymphadenopathy. No pelvic sidewall lymphadenopathy. Reproductive: Prostate unremarkable by CT. Other: Large fat containing RIGHT inguinal hernia with incipient herniation of small bowel loops in the RIGHT lower quadrant. Moderate fat containing LEFT inguinal hernia. Musculoskeletal: No acute musculoskeletal process. No destructive bone finding. IMPRESSION: 1. Similar appearance of background post treatment changes in the RIGHT chest with marked interval improvement of irregular septal thickening and resolution of solid nodules now with only faint ground-glass scattered throughout the RIGHT chest. Compatible with interval response to therapy. 2. Increased RIGHT pleural effusion is however noted on today's study. No nodular enhancing component, attention on follow-up. 3. New 7 mm solid nodule at the LEFT lung base. This is suspicious for new site of disease. 4. Decreased conspicuity of small lymph nodes which were FDG avid in the RIGHT axilla on the previous study also with diminished size of nodal tissue near the LEFT diaphragmatic crus. 5. No evidence of metastatic disease to  the abdomen or pelvis. 6. Large fat containing RIGHT inguinal hernia with incipient herniation of small bowel loops in the RIGHT lower quadrant. No acute small bowel process. 7. Aortic atherosclerosis. Aortic Atherosclerosis (ICD10-I70.0). Electronically Signed   By: Zetta Bills M.D.   On: 05/03/2020 17:04       IMPRESSION/PLAN: 1. Progressive metastatic non-small cell lung cancer, adenocarcinoma originally in the right lung with prior concern for brain metastases. Dr. Lisbeth Renshaw discussed the patient's case in our brain oncology conference and fortunately neuroradiology agreed with the original report that there did not appear to be any evidence of disease. It's curious if there was disease that was improved by his systemic dual agent immunotherapy and Alimta regimen that he only had one cycle of, versus the possibility that these sites were vascular features. The recommendations that came from brain oncology conference were to proceed with a repeat MRI of the brain in 6 months time, or sooner if symptoms of concern were  noted. We will follow his course expectantly otherwise as he continues under Dr. Worthy Flank care. 2. Radiation recall rash. Clinically this has remained stable. We will follow this expectantly. 3. Claustrophobia. The patient has a prescription for Ativan to take prior to radiotherapy, which I let him know could instead be used to avoid claustrophobia prior to MRI scans. He is in agreement with this plan.   Given current concerns for patient exposure during the COVID-19 pandemic, this encounter was conducted via telephone.  The patient has provided two factor identification and has given verbal consent for this type of encounter and has been advised to only accept a meeting of this type in a secure network environment. The time spent during this encounter was 25 minutes including preparation, discussion, and coordination of the patient's care. The attendants for this meeting include   Hayden Pedro  and Danella Sensing.  During the encounter, Hayden Pedro was located at [facility] Radiation Oncology Department.  Zymire Turnbo was located in the lobby of the cancer center to check in for his appts today.   The above documentation reflects my direct findings during this shared patient visit. Please see the separate note by Dr. Lisbeth Renshaw on this date for the remainder of the patient's plan of care.    Carola Rhine, PAC

## 2020-05-06 NOTE — Progress Notes (Signed)
[place] [phone]   [phone]  OFFICE PROGRESS NOTE  [name], MD [place] [address]  DIAGNOSIS:  Metastatic non-small cell lung cancer initially diagnosed as stage IIIB (T1c, N3, M0)non-small cell lung cancer, adenocarcinoma presented with right upper lobe lung mass in addition to mediastinal and bilateral supraclavicular lymphadenopathy diagnosed in January 2020.  He had evidence of disease recurrence in June 2021 with adenopathy in the subcarinal, level 3 left neck lymph node, and lymph node at the curious of the diaphragm and in the upper abdomen.  PD-L1: 10%  [employer] 360 molecular studiesshowed no actionable mutation  [employer] Testing:  Biomarker Findings Microsatellite status - MS-Stable Tumor Mutational Burden - 4 Muts/Mb Genomic Findings For a complete list of the genes assayed, please refer to the Appendix. ALK EML4-ALK fusion (Variant 2) SMARCB1 R377C CTNNB1 S45P CDKN2A/B CDKN2B loss, CDKN2A loss CXCR4 R830* NM07 splice site 680S>U 7 Disease relevant genes with no reportable alterations: BRAF, EGFR, ERBB2, KRAS, MET, RET, ROS1  PRIOR THERAPY: 1)Concurrent chemoradiation with weekly carboplatin for AUC of 2 and paclitaxel 45 mg/M2.Status post 6 cycles. Last dose was given on October 10, 2018 with stable disease. 2) Radiation treatment to the enlarging right cervical lymph nodes under the care of Dr. Sondra Come. First treatment 03/06/2019. Last treatment scheduledon9/05/2019 3)Consolidation treatment with immunotherapy with Imfinzi 10 mg/KG every 2 weeks. First dose November 17, 2018. Status post26cycles. 4) Systemic chemotherapy with carboplatin for an AUC of 5, Alimta 500 mg/m2, and Keytruda 200 mg IV every 3 weeks. First dose expected on 02/14/20. Status post 1 cycle.  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  CURRENT THERAPY:  [name] ([name]) 600 mg p.o. twice daily.  He started the first dose on March 08, 2020.   Status post 2 months of treatment.  INTERVAL HISTORY: Brian [name] 59 y.o. male returns to the clinic today for follow-up visit accompanied by his wife.  The patient is feeling fine today with no concerning complaints except for worsening cough over the last few weeks.  He has mild associated shortness of breath but no significant chest pain or hemoptysis.  He denied having any fever or chills.  He has no nausea, vomiting, diarrhea or constipation.  He has no headache or visual changes.  He has no weight loss or night sweats.  The patient continues to tolerate his treatment with [name] fairly well.  He had repeat CT scan of the chest, abdomen pelvis performed recently and he is here for evaluation and discussion of his discuss results.   MEDICAL HISTORY: Past Medical History:  Diagnosis Date  . Asthma   . Diabetes mellitus without complication ([place])   . Hypertension   . nscl ca dx'd 08/2018  . Subarachnoid hemorrhage ([facility])     ALLERGIES:  has No Known Allergies.  MEDICATIONS:  Current Outpatient Medications  Medication Sig Dispense Refill  . albuterol (VENTOLIN HFA) 108 (90 Base) MCG/ACT inhaler Inhale 2 puffs into the lungs every 4 (four) hours as needed for wheezing or shortness of breath. 6.7 g 5  . alectinib ([name]) 150 MG capsule Take 4 capsules (600 mg total) by mouth 2 (two) times daily with a meal. 720 capsule 0  . amLODipine (NORVASC) 5 MG tablet Take 5 mg by mouth daily.     Marland Kitchen atorvastatin (LIPITOR) 20 MG tablet Take  20 mg by mouth daily.     . benzonatate (TESSALON) 200 MG capsule Take one cap 2-3 times a day for cough 60 capsule 2  . cholecalciferol (VITAMIN D3) 25 MCG (1000 UNIT) tablet Take 1,000 Units by mouth daily.    Marland Kitchen COVID-19 Specimen Collection KIT See admin instructions. follow package directions (Patient not taking:  Reported on 05/02/2020)    . enoxaparin (LOVENOX) 120 MG/0.8ML injection Inject 0.8 mLs (120 mg total) into the skin daily. 24 mL 0  . folic acid (FOLVITE) 1 MG tablet Take 1 tablet (1 mg total) by mouth daily. 30 tablet 2  . levothyroxine (SYNTHROID) 75 MCG tablet Take 1 tablet (75 mcg total) by mouth daily before breakfast. 90 tablet 1  . metFORMIN (GLUCOPHAGE) 500 MG tablet Take 500 mg by mouth daily with breakfast.     . prochlorperazine (COMPAZINE) 10 MG tablet Take 1 tablet (10 mg total) by mouth every 6 (six) hours as needed. 30 tablet 2  . Respiratory Therapy Supplies (FLUTTER) DEVI Please use up 10 times daily(4-5 breaths, 4-5 times daily) 1 each 0  . TRELEGY ELLIPTA 100-62.5-25 MCG/INH AEPB Inhale 1 puff into the lungs daily.    Marland Kitchen venlafaxine (EFFEXOR) 37.5 MG tablet Take 37.5 mg by mouth daily.      No current facility-administered medications for this visit.    SURGICAL HISTORY:  Past Surgical History:  Procedure Laterality Date  . IR IVC FILTER PLMT / S&I /IMG GUID/MOD SED  03/04/2020  . LEG SURGERY  age 41    left leg, from fracture    REVIEW OF SYSTEMS:  Constitutional: negative Eyes: negative Ears, nose, mouth, throat, and face: negative Respiratory: positive for cough and dyspnea on exertion Cardiovascular: negative Gastrointestinal: negative Genitourinary:negative Integument/breast: negative Hematologic/lymphatic: negative Musculoskeletal:negative Neurological: negative Behavioral/Psych: negative Endocrine: negative Allergic/Immunologic: negative   PHYSICAL EXAMINATION: General appearance: alert, cooperative and no distress Head: Normocephalic, without obvious abnormality, atraumatic Neck: no adenopathy, no JVD, supple, symmetrical, trachea midline and thyroid not enlarged, symmetric, no tenderness/mass/nodules Lymph nodes: Cervical, supraclavicular, and axillary nodes normal. Resp: diminished breath sounds RLL and dullness to percussion RLL Back: symmetric, no  curvature. ROM normal. No CVA tenderness. Cardio: regular rate and rhythm, S1, S2 normal, no murmur, click, rub or gallop GI: soft, non-tender; bowel sounds normal; no masses,  no organomegaly Extremities: extremities normal, atraumatic, no cyanosis or edema Neurologic: Alert and oriented X 3, normal strength and tone. Normal symmetric reflexes. Normal coordination and gait  ECOG PERFORMANCE STATUS: 1 - Symptomatic but completely ambulatory  Blood pressure 114/74, pulse 83, temperature 98.8 F (37.1 C), temperature source Tympanic, resp. rate 17, height _0  (1.727 m), weight 177 lb 3.2 oz (80.4 kg), SpO2 93 %.  LABORATORY DATA: Lab Results  Component Value Date   WBC 9.2 05/03/2020   HGB 11.6 (L) 05/03/2020   HCT 35.3 (L) 05/03/2020   MCV 95.7 05/03/2020   PLT 283 05/03/2020      Chemistry      Component Value Date/Time   NA 137 05/03/2020 1515   K 3.8 05/03/2020 1515   CL 105 05/03/2020 1515   CO2 25 05/03/2020 1515   BUN 13 05/03/2020 1515   CREATININE 0.99 05/03/2020 1515      Component Value Date/Time   CALCIUM 8.9 05/03/2020 1515   ALKPHOS 205 (H) 05/03/2020 1515   AST 30 05/03/2020 1515   ALT 36 05/03/2020 1515   BILITOT 0.7 05/03/2020 1515       RADIOGRAPHIC STUDIES:  CT Chest W Contrast  Result Date: 05/03/2020 CLINICAL DATA:  Non-small cell lung cancer staging. History of prior radiotherapy to the neck and chest, also post chemo and immunotherapy. EXAM: CT CHEST, ABDOMEN, AND PELVIS WITH CONTRAST TECHNIQUE: Multidetector CT imaging of the chest, abdomen and pelvis was performed following the standard protocol during bolus administration of intravenous contrast. CONTRAST:  13m OMNIPAQUE IOHEXOL 300 MG/ML  SOLN COMPARISON:  CT angiography of the chest from July of 2021, prior PET scans from July and CT scans from June of 2021. FINDINGS: CT CHEST FINDINGS Cardiovascular: Normal caliber thoracic aorta. Normal heart size without pericardial effusion. Central  pulmonary vessels are unremarkable on venous phase assessment aside from post treatment changes about the RIGHT hilum Mediastinum/Nodes: Vascular structures are normal. Esophagus is patulous mildly thickened. No axillary lymphadenopathy. No mediastinal lymphadenopathy. No hilar adenopathy. Soft tissue and RIGHT hilum as on previous imaging. No discrete lymph node in this location. Lungs/Pleura: Since the CT of June of 2021 there has been development of an effusion which was present on the study of July of 2021. This effusion is slightly increased compared to the July study. This measures approximately 5 cm greatest depth on today's exam previously approximately 3.9 cm. Consolidative changes and post treatment effect about the RIGHT hilum with similar appearance. Nodular focus in the RIGHT upper lobe, discrete nodule on the prior study has nearly completely resolved only ground-glass attenuation remaining in the the RIGHT upper lobe at the site of previous ill-defined nodules. Septal thickening in this area is subjectively improved. Septal thickening in aerated RIGHT lower lobe also with subjective improvement. RIGHT lower lobe nodules seen on previous imaging now with only ground-glass attenuation. At the site of the largest area in the RIGHT lower lobe there is consolidative change in the lung and ground-glass without measurable nodule. There is however a new small solid nodule at the LEFT lung base measuring 7 mm (image 127, series 4) Above findings also with dramatic improvement with respect to septal thickening as compared to the study of February 02, 2020 and with respect to discrete nodules with solid characteristics that were seen on previous imaging. Musculoskeletal: No discrete chest wall mass. See below for full musculoskeletal detail. CT ABDOMEN PELVIS FINDINGS Hepatobiliary: No focal, suspicious hepatic lesion. No pericholecystic stranding. No biliary duct dilation. Pancreas: Pancreas without ductal dilation  or inflammation. Spleen: Spleen normal in size and contour. Adrenals/Urinary Tract: Adrenal glands are normal. Symmetric renal enhancement. No hydronephrosis. Urinary bladder is normal. No suspicious renal lesion. Stable low-density lesion in the upper pole LEFT kidney compatible with cysts. Stomach/Bowel: Small hiatal hernia. Mild esophageal thickening throughout and patulous esophagus as described previously. No acute small bowel process. No bowel obstruction. Appendix is normal. Stool throughout much of the colon. Vascular/Lymphatic: Calcified and noncalcified plaque, mild in the abdominal aorta. No aneurysmal dilation. IVC filter in place, tip just at or below level of renal venous confluence. There is no gastrohepatic or hepatoduodenal ligament lymphadenopathy. No retroperitoneal or mesenteric lymphadenopathy. No pelvic sidewall lymphadenopathy. Reproductive: Prostate unremarkable by CT. Other: Large fat containing RIGHT inguinal hernia with incipient herniation of small bowel loops in the RIGHT lower quadrant. Moderate fat containing LEFT inguinal hernia. Musculoskeletal: No acute musculoskeletal process. No destructive bone finding. IMPRESSION: 1. Similar appearance of background post treatment changes in the RIGHT chest with marked interval improvement of irregular septal thickening and resolution of solid nodules now with only faint ground-glass scattered throughout the RIGHT chest. Compatible with interval response to therapy. 2.  Increased RIGHT pleural effusion is however noted on today's study. No nodular enhancing component, attention on follow-up. 3. New 7 mm solid nodule at the LEFT lung base. This is suspicious for new site of disease. 4. Decreased conspicuity of small lymph nodes which were FDG avid in the RIGHT axilla on the previous study also with diminished size of nodal tissue near the LEFT diaphragmatic crus. 5. No evidence of metastatic disease to the abdomen or pelvis. 6. Large fat  containing RIGHT inguinal hernia with incipient herniation of small bowel loops in the RIGHT lower quadrant. No acute small bowel process. 7. Aortic atherosclerosis. Aortic Atherosclerosis (ICD10-I70.0). Electronically Signed   By: Zetta Bills M.D.   On: 05/03/2020 17:04   MR Brain W Wo Contrast  Result Date: 05/02/2020 CLINICAL DATA:  Brain/CNS neoplasm, surveillance. EXAM: MRI HEAD WITHOUT AND WITH CONTRAST TECHNIQUE: Multiplanar, multiecho pulse sequences of the brain and surrounding structures were obtained without and with intravenous contrast. CONTRAST:  19m MULTIHANCE GADOBENATE DIMEGLUMINE 529 MG/ML IV SOLN COMPARISON:  None. FINDINGS: Brain: No acute infarction, hemorrhage, hydrocephalus or extra-axial collection. Few scattered foci of T2 hyperintensity are seen within the white matter of the cerebral hemispheres and within the pons, nonspecific, most likely related to early microangiopathic changes. Previously described foci of contrast enhancement are no longer appreciated. No new focus of abnormal contrast enhancement identified. Vascular: Normal flow voids. Skull and upper cervical spine: Normal marrow signal. Sinuses/Orbits: Negative. Other: None. IMPRESSION: 1. No evidence of intracranial metastatic disease. 2. Mild chronic microangiopathic changes. Electronically Signed   By: KPedro EarlsM.D.   On: 05/02/2020 15:22   CT Abdomen Pelvis W Contrast  Result Date: 05/03/2020 CLINICAL DATA:  Non-small cell lung cancer staging. History of prior radiotherapy to the neck and chest, also post chemo and immunotherapy. EXAM: CT CHEST, ABDOMEN, AND PELVIS WITH CONTRAST TECHNIQUE: Multidetector CT imaging of the chest, abdomen and pelvis was performed following the standard protocol during bolus administration of intravenous contrast. CONTRAST:  1075mOMNIPAQUE IOHEXOL 300 MG/ML  SOLN COMPARISON:  CT angiography of the chest from July of 2021, prior PET scans from July and CT scans  from June of 2021. FINDINGS: CT CHEST FINDINGS Cardiovascular: Normal caliber thoracic aorta. Normal heart size without pericardial effusion. Central pulmonary vessels are unremarkable on venous phase assessment aside from post treatment changes about the RIGHT hilum Mediastinum/Nodes: Vascular structures are normal. Esophagus is patulous mildly thickened. No axillary lymphadenopathy. No mediastinal lymphadenopathy. No hilar adenopathy. Soft tissue and RIGHT hilum as on previous imaging. No discrete lymph node in this location. Lungs/Pleura: Since the CT of June of 2021 there has been development of an effusion which was present on the study of July of 2021. This effusion is slightly increased compared to the July study. This measures approximately 5 cm greatest depth on today's exam previously approximately 3.9 cm. Consolidative changes and post treatment effect about the RIGHT hilum with similar appearance. Nodular focus in the RIGHT upper lobe, discrete nodule on the prior study has nearly completely resolved only ground-glass attenuation remaining in the the RIGHT upper lobe at the site of previous ill-defined nodules. Septal thickening in this area is subjectively improved. Septal thickening in aerated RIGHT lower lobe also with subjective improvement. RIGHT lower lobe nodules seen on previous imaging now with only ground-glass attenuation. At the site of the largest area in the RIGHT lower lobe there is consolidative change in the lung and ground-glass without measurable nodule. There is however a new  small solid nodule at the LEFT lung base measuring 7 mm (image 127, series 4) Above findings also with dramatic improvement with respect to septal thickening as compared to the study of February 02, 2020 and with respect to discrete nodules with solid characteristics that were seen on previous imaging. Musculoskeletal: No discrete chest wall mass. See below for full musculoskeletal detail. CT ABDOMEN PELVIS FINDINGS  Hepatobiliary: No focal, suspicious hepatic lesion. No pericholecystic stranding. No biliary duct dilation. Pancreas: Pancreas without ductal dilation or inflammation. Spleen: Spleen normal in size and contour. Adrenals/Urinary Tract: Adrenal glands are normal. Symmetric renal enhancement. No hydronephrosis. Urinary bladder is normal. No suspicious renal lesion. Stable low-density lesion in the upper pole LEFT kidney compatible with cysts. Stomach/Bowel: Small hiatal hernia. Mild esophageal thickening throughout and patulous esophagus as described previously. No acute small bowel process. No bowel obstruction. Appendix is normal. Stool throughout much of the colon. Vascular/Lymphatic: Calcified and noncalcified plaque, mild in the abdominal aorta. No aneurysmal dilation. IVC filter in place, tip just at or below level of renal venous confluence. There is no gastrohepatic or hepatoduodenal ligament lymphadenopathy. No retroperitoneal or mesenteric lymphadenopathy. No pelvic sidewall lymphadenopathy. Reproductive: Prostate unremarkable by CT. Other: Large fat containing RIGHT inguinal hernia with incipient herniation of small bowel loops in the RIGHT lower quadrant. Moderate fat containing LEFT inguinal hernia. Musculoskeletal: No acute musculoskeletal process. No destructive bone finding. IMPRESSION: 1. Similar appearance of background post treatment changes in the RIGHT chest with marked interval improvement of irregular septal thickening and resolution of solid nodules now with only faint ground-glass scattered throughout the RIGHT chest. Compatible with interval response to therapy. 2. Increased RIGHT pleural effusion is however noted on today's study. No nodular enhancing component, attention on follow-up. 3. New 7 mm solid nodule at the LEFT lung base. This is suspicious for new site of disease. 4. Decreased conspicuity of small lymph nodes which were FDG avid in the RIGHT axilla on the previous study also with  diminished size of nodal tissue near the LEFT diaphragmatic crus. 5. No evidence of metastatic disease to the abdomen or pelvis. 6. Large fat containing RIGHT inguinal hernia with incipient herniation of small bowel loops in the RIGHT lower quadrant. No acute small bowel process. 7. Aortic atherosclerosis. Aortic Atherosclerosis (ICD10-I70.0). Electronically Signed   By: Zetta Bills M.D.   On: 05/03/2020 17:04    ASSESSMENT AND PLAN: This is a very pleasant 59 years old white male with metastatic non-small cell lung cancer, adenocarcinoma now with ALK gene translocation.  The patient was initially diagnosed as a stage IIIa non-small cell lung cancer status post a course of concurrent chemoradiation with weekly carboplatin and paclitaxel followed by 1 year of consolidation treatment with immunotherapy with Imfinzi. He had evidence for disease progression recently. Repeat tissue biopsy from the left supraclavicular lymph nodes and molecular studies showed that the patient has positive ALK gene translocation.  His previous molecular studies by [employer] was negative. I had a lengthy discussion with the patient today about his condition and treatment options. He received 1 cycle of systemic chemotherapy with carboplatin, Alimta and Keytruda.  We will discontinue his systemic chemotherapy for now because of the new findings on the molecular studies. The patient is currently on treatment with [name] 600 mg p.o. twice daily.  He is status post 2 months of treatment and has been tolerating this treatment fairly well. He had repeat CT scan of the chest, abdomen pelvis performed recently.  I personally and independently  reviewed the scan images and discussed the results and showed the images to the patient and his wife. His scan showed no concerning findings for disease progression except for new 7 mm left lower lobe pulmonary nodule suspicious for new lesion versus inflammatory process and need close  monitoring on the upcoming imaging studies. The patient also has increase in the right pleural effusion. His brain MRI showed no concerning findings for disease progression in the brain and the patient has significant improvement in his disease. I recommended for the patient to continue his current treatment with [name] with the same dose. For the enlarging right pleural effusion, I will arrange for the patient to have ultrasound-guided right thoracentesis. For the suspicious left lower lobe pulmonary nodule, we will continue to monitor this closely on the upcoming imaging studies and if it continues to increase in size, we may consider the patient for SBRT to this lesion. For the hypothyroidism, I increased his dose of levothyroxine to 100 mcg p.o. daily. For the constipation he will continue with Dulcolax and milk of magnesia. For the abdominal hernia, he is expected to have his surgery in the next few weeks. For the current deep venous thrombosis, he will continue treatment with Lovenox today and the patient had IVC filter placed yesterday. The patient will come back for follow-up visit in 1 months for evaluation and repeat blood work. He was advised to call immediately if he has any concerning symptoms in the interval. The patient voices understanding of current disease status and treatment options and is in agreement with the current care plan.  All questions were answered. The patient knows to call the clinic with any problems, questions or concerns. We can certainly see the patient much sooner if necessary.  Disclaimer: This note was dictated with voice recognition software. Similar sounding words can inadvertently be transcribed and may not be corrected upon review.

## 2020-05-07 NOTE — Telephone Encounter (Signed)
Refill request

## 2020-05-08 ENCOUNTER — Other Ambulatory Visit: Payer: Self-pay | Admitting: Interventional Radiology

## 2020-05-08 ENCOUNTER — Telehealth: Payer: Self-pay | Admitting: Internal Medicine

## 2020-05-08 DIAGNOSIS — C3491 Malignant neoplasm of unspecified part of right bronchus or lung: Secondary | ICD-10-CM | POA: Diagnosis not present

## 2020-05-08 DIAGNOSIS — Z95828 Presence of other vascular implants and grafts: Secondary | ICD-10-CM

## 2020-05-08 DIAGNOSIS — J439 Emphysema, unspecified: Secondary | ICD-10-CM | POA: Diagnosis not present

## 2020-05-08 DIAGNOSIS — J189 Pneumonia, unspecified organism: Secondary | ICD-10-CM | POA: Diagnosis not present

## 2020-05-08 NOTE — Telephone Encounter (Signed)
Scheduled per los. Called and left msg. Mailed printout  °

## 2020-05-09 ENCOUNTER — Other Ambulatory Visit: Payer: Self-pay | Admitting: Physician Assistant

## 2020-05-09 DIAGNOSIS — C3491 Malignant neoplasm of unspecified part of right bronchus or lung: Secondary | ICD-10-CM

## 2020-05-10 ENCOUNTER — Other Ambulatory Visit (HOSPITAL_COMMUNITY)
Admission: RE | Admit: 2020-05-10 | Discharge: 2020-05-10 | Disposition: A | Discharge status: Home / Self Care | Payer: BC Managed Care – PPO | Source: Ambulatory Visit | Attending: Internal Medicine | Admitting: Internal Medicine

## 2020-05-10 DIAGNOSIS — Z01812 Encounter for preprocedural laboratory examination: Secondary | ICD-10-CM | POA: Diagnosis not present

## 2020-05-10 DIAGNOSIS — Z20822 Contact with and (suspected) exposure to covid-19: Secondary | ICD-10-CM | POA: Insufficient documentation

## 2020-05-10 LAB — SARS CORONAVIRUS 2 (TAT 6-24 HRS): SARS Coronavirus 2: NEGATIVE

## 2020-05-11 ENCOUNTER — Encounter: Payer: Self-pay | Admitting: Internal Medicine

## 2020-05-13 ENCOUNTER — Other Ambulatory Visit: Payer: Self-pay | Admitting: Physician Assistant

## 2020-05-13 ENCOUNTER — Other Ambulatory Visit: Payer: Self-pay

## 2020-05-13 ENCOUNTER — Ambulatory Visit (HOSPITAL_COMMUNITY)
Admission: RE | Admit: 2020-05-13 | Discharge: 2020-05-13 | Disposition: A | Discharge status: Home / Self Care | Payer: BC Managed Care – PPO | Source: Ambulatory Visit | Attending: Internal Medicine | Admitting: Internal Medicine

## 2020-05-13 ENCOUNTER — Ambulatory Visit (HOSPITAL_COMMUNITY)
Admission: RE | Admit: 2020-05-13 | Discharge: 2020-05-13 | Disposition: A | Discharge status: Home / Self Care | Payer: BC Managed Care – PPO | Source: Ambulatory Visit | Attending: Radiology | Admitting: Radiology

## 2020-05-13 DIAGNOSIS — C3491 Malignant neoplasm of unspecified part of right bronchus or lung: Secondary | ICD-10-CM

## 2020-05-13 DIAGNOSIS — R918 Other nonspecific abnormal finding of lung field: Secondary | ICD-10-CM | POA: Diagnosis not present

## 2020-05-13 DIAGNOSIS — J90 Pleural effusion, not elsewhere classified: Secondary | ICD-10-CM | POA: Diagnosis not present

## 2020-05-13 DIAGNOSIS — Z9889 Other specified postprocedural states: Secondary | ICD-10-CM | POA: Diagnosis not present

## 2020-05-13 MED ORDER — LIDOCAINE HCL 1 % IJ SOLN
INTRAMUSCULAR | Status: AC
  Filled 2020-05-13: qty 20

## 2020-05-13 NOTE — Procedures (Signed)
Ultrasound-guided  therapeutic right thoracentesis performed yielding 900 cc of  amber fluid. No immediate complications. Follow-up chest x-ray pending.EBL< 1 cc.

## 2020-05-17 ENCOUNTER — Encounter: Payer: Self-pay | Admitting: Internal Medicine

## 2020-05-18 ENCOUNTER — Inpatient Hospital Stay (HOSPITAL_COMMUNITY): Payer: BC Managed Care – PPO

## 2020-05-18 ENCOUNTER — Encounter (HOSPITAL_COMMUNITY): Payer: Self-pay | Admitting: Emergency Medicine

## 2020-05-18 ENCOUNTER — Other Ambulatory Visit: Payer: Self-pay

## 2020-05-18 ENCOUNTER — Inpatient Hospital Stay (HOSPITAL_COMMUNITY)
Admission: EM | Admit: 2020-05-18 | Discharge: 2020-05-20 | DRG: 193 | Disposition: A | Discharge status: Home / Self Care | Payer: BC Managed Care – PPO | Attending: Internal Medicine | Admitting: Internal Medicine

## 2020-05-18 ENCOUNTER — Emergency Department (HOSPITAL_COMMUNITY): Payer: BC Managed Care – PPO

## 2020-05-18 DIAGNOSIS — Z923 Personal history of irradiation: Secondary | ICD-10-CM | POA: Diagnosis not present

## 2020-05-18 DIAGNOSIS — Z66 Do not resuscitate: Secondary | ICD-10-CM | POA: Diagnosis present

## 2020-05-18 DIAGNOSIS — R0602 Shortness of breath: Secondary | ICD-10-CM | POA: Diagnosis not present

## 2020-05-18 DIAGNOSIS — I8290 Acute embolism and thrombosis of unspecified vein: Secondary | ICD-10-CM | POA: Diagnosis present

## 2020-05-18 DIAGNOSIS — I10 Essential (primary) hypertension: Secondary | ICD-10-CM | POA: Diagnosis not present

## 2020-05-18 DIAGNOSIS — Z20822 Contact with and (suspected) exposure to covid-19: Secondary | ICD-10-CM | POA: Diagnosis not present

## 2020-05-18 DIAGNOSIS — E785 Hyperlipidemia, unspecified: Secondary | ICD-10-CM | POA: Diagnosis not present

## 2020-05-18 DIAGNOSIS — J432 Centrilobular emphysema: Secondary | ICD-10-CM | POA: Diagnosis not present

## 2020-05-18 DIAGNOSIS — E039 Hypothyroidism, unspecified: Secondary | ICD-10-CM | POA: Diagnosis not present

## 2020-05-18 DIAGNOSIS — Z7984 Long term (current) use of oral hypoglycemic drugs: Secondary | ICD-10-CM

## 2020-05-18 DIAGNOSIS — J910 Malignant pleural effusion: Secondary | ICD-10-CM | POA: Diagnosis present

## 2020-05-18 DIAGNOSIS — Z79890 Hormone replacement therapy (postmenopausal): Secondary | ICD-10-CM

## 2020-05-18 DIAGNOSIS — E1169 Type 2 diabetes mellitus with other specified complication: Secondary | ICD-10-CM

## 2020-05-18 DIAGNOSIS — Z86718 Personal history of other venous thrombosis and embolism: Secondary | ICD-10-CM

## 2020-05-18 DIAGNOSIS — Z7951 Long term (current) use of inhaled steroids: Secondary | ICD-10-CM | POA: Diagnosis not present

## 2020-05-18 DIAGNOSIS — J9601 Acute respiratory failure with hypoxia: Secondary | ICD-10-CM | POA: Diagnosis not present

## 2020-05-18 DIAGNOSIS — J189 Pneumonia, unspecified organism: Secondary | ICD-10-CM

## 2020-05-18 DIAGNOSIS — Z9221 Personal history of antineoplastic chemotherapy: Secondary | ICD-10-CM

## 2020-05-18 DIAGNOSIS — C3491 Malignant neoplasm of unspecified part of right bronchus or lung: Secondary | ICD-10-CM | POA: Diagnosis not present

## 2020-05-18 DIAGNOSIS — R059 Cough, unspecified: Secondary | ICD-10-CM | POA: Diagnosis not present

## 2020-05-18 DIAGNOSIS — R000 Tachycardia, unspecified: Secondary | ICD-10-CM | POA: Diagnosis not present

## 2020-05-18 LAB — COMPREHENSIVE METABOLIC PANEL WITH GFR
ALT: 31 U/L (ref 0–44)
AST: 36 U/L (ref 15–41)
Albumin: 3.2 g/dL — ABNORMAL LOW (ref 3.5–5.0)
Alkaline Phosphatase: 179 U/L — ABNORMAL HIGH (ref 38–126)
Anion gap: 9 (ref 5–15)
BUN: 16 mg/dL (ref 6–20)
CO2: 21 mmol/L — ABNORMAL LOW (ref 22–32)
Calcium: 8.6 mg/dL — ABNORMAL LOW (ref 8.9–10.3)
Chloride: 106 mmol/L (ref 98–111)
Creatinine, Ser: 0.93 mg/dL (ref 0.61–1.24)
GFR, Estimated: >60 mL/min (ref >60)
Glucose, Bld: 156 mg/dL — ABNORMAL HIGH (ref 70–99)
Potassium: 3.6 mmol/L (ref 3.5–5.1)
Sodium: 136 mmol/L (ref 135–145)
Total Bilirubin: 0.8 mg/dL (ref 0.3–1.2)
Total Protein: 5.9 g/dL — ABNORMAL LOW (ref 6.5–8.1)

## 2020-05-18 LAB — CBG MONITORING, ED
Glucose-Capillary: 89 mg/dL (ref 70–99)
Glucose-Capillary: 109 mg/dL — ABNORMAL HIGH (ref 70–99)
Glucose-Capillary: 93 mg/dL (ref 70–99)

## 2020-05-18 LAB — ECHOCARDIOGRAM COMPLETE
Area-P 1/2: 2.76 cm2
S' Lateral: 2.6 cm

## 2020-05-18 LAB — CBC WITH DIFFERENTIAL/PLATELET
Abs Immature Granulocytes: 0.15 K/uL — ABNORMAL HIGH (ref 0.00–0.07)
Basophils Absolute: 0.2 K/uL — ABNORMAL HIGH (ref 0.0–0.1)
Basophils Relative: 1 %
Eosinophils Absolute: 0.3 K/uL (ref 0.0–0.5)
Eosinophils Relative: 3 %
HCT: 36.6 % — ABNORMAL LOW (ref 39.0–52.0)
Hemoglobin: 12.1 g/dL — ABNORMAL LOW (ref 13.0–17.0)
Immature Granulocytes: 1 %
Lymphocytes Relative: 5 %
Lymphs Abs: 0.6 K/uL — ABNORMAL LOW (ref 0.7–4.0)
MCH: 32.1 pg (ref 26.0–34.0)
MCHC: 33.1 g/dL (ref 30.0–36.0)
MCV: 97.1 fL (ref 80.0–100.0)
Monocytes Absolute: 1.5 K/uL — ABNORMAL HIGH (ref 0.1–1.0)
Monocytes Relative: 12 %
Neutro Abs: 10.2 K/uL — ABNORMAL HIGH (ref 1.7–7.7)
Neutrophils Relative %: 78 %
Platelets: 322 K/uL (ref 150–400)
RBC: 3.77 MIL/uL — ABNORMAL LOW (ref 4.22–5.81)
RDW: 16.8 % — ABNORMAL HIGH (ref 11.5–15.5)
WBC: 12.9 K/uL — ABNORMAL HIGH (ref 4.0–10.5)
nRBC: 0 % (ref 0.0–0.2)

## 2020-05-18 LAB — RESPIRATORY PANEL BY RT PCR (FLU A&B, COVID)
Influenza A by PCR: NEGATIVE
Influenza B by PCR: NEGATIVE
SARS Coronavirus 2 by RT PCR: NEGATIVE

## 2020-05-18 MED ORDER — INSULIN ASPART 100 UNIT/ML ~~LOC~~ SOLN
0.0000 [IU] | Freq: Three times a day (TID) | SUBCUTANEOUS | Status: DC
  Administered 2020-05-19 – 2020-05-20 (×2): 2 [IU] via SUBCUTANEOUS
  Filled 2020-05-18: qty 0.09

## 2020-05-18 MED ORDER — ALBUTEROL SULFATE HFA 108 (90 BASE) MCG/ACT IN AERS: 2.0000 | INHALATION_SPRAY | RESPIRATORY_TRACT | Status: DC | PRN

## 2020-05-18 MED ORDER — SODIUM CHLORIDE 0.9 % IV SOLN
2.0000 g | INTRAVENOUS | Status: DC
  Administered 2020-05-18 – 2020-05-20 (×3): 2 g via INTRAVENOUS
  Filled 2020-05-18: qty 2
  Filled 2020-05-18 (×2): qty 20

## 2020-05-18 MED ORDER — VENLAFAXINE HCL 37.5 MG PO TABS
37.5000 mg | ORAL_TABLET | Freq: Every day | ORAL | Status: DC
  Administered 2020-05-19 – 2020-05-20 (×2): 37.5 mg via ORAL
  Filled 2020-05-18 (×2): qty 1

## 2020-05-18 MED ORDER — ADULT MULTIVITAMIN W/MINERALS CH
1.0000 | ORAL_TABLET | Freq: Every day | ORAL | Status: DC
  Administered 2020-05-19 – 2020-05-20 (×2): 1 via ORAL
  Filled 2020-05-18: qty 1

## 2020-05-18 MED ORDER — ACETAMINOPHEN 650 MG RE SUPP: 650.0000 mg | Freq: Four times a day (QID) | RECTAL | Status: DC | PRN

## 2020-05-18 MED ORDER — SODIUM CHLORIDE 0.9 % IV SOLN
1.0000 g | Freq: Once | INTRAVENOUS | Status: AC
  Administered 2020-05-18: 1 g via INTRAVENOUS
  Filled 2020-05-18: qty 10

## 2020-05-18 MED ORDER — PROCHLORPERAZINE MALEATE 10 MG PO TABS
10.0000 mg | ORAL_TABLET | Freq: Four times a day (QID) | ORAL | Status: DC | PRN
  Filled 2020-05-18: qty 1

## 2020-05-18 MED ORDER — METOPROLOL TARTRATE 5 MG/5ML IV SOLN: 5.0000 mg | Freq: Four times a day (QID) | INTRAVENOUS | Status: DC | PRN

## 2020-05-18 MED ORDER — LEVOTHYROXINE SODIUM 100 MCG PO TABS
100.0000 ug | ORAL_TABLET | Freq: Every day | ORAL | Status: DC
  Administered 2020-05-19 – 2020-05-20 (×2): 100 ug via ORAL
  Filled 2020-05-18 (×2): qty 1

## 2020-05-18 MED ORDER — ATORVASTATIN CALCIUM 20 MG PO TABS
20.0000 mg | ORAL_TABLET | Freq: Every day | ORAL | Status: DC
  Administered 2020-05-18 – 2020-05-20 (×3): 20 mg via ORAL
  Filled 2020-05-18: qty 1
  Filled 2020-05-18 (×2): qty 2

## 2020-05-18 MED ORDER — SODIUM CHLORIDE 0.9 % IV SOLN
500.0000 mg | INTRAVENOUS | Status: DC
  Administered 2020-05-18 – 2020-05-19 (×2): 500 mg via INTRAVENOUS
  Filled 2020-05-18 (×3): qty 500

## 2020-05-18 MED ORDER — SODIUM CHLORIDE 0.9% FLUSH
3.0000 mL | Freq: Two times a day (BID) | INTRAVENOUS | Status: DC
  Administered 2020-05-18 – 2020-05-20 (×4): 3 mL via INTRAVENOUS

## 2020-05-18 MED ORDER — FLUTICASONE-UMECLIDIN-VILANT 100-62.5-25 MCG/INH IN AEPB: 1.0000 | INHALATION_SPRAY | Freq: Every day | RESPIRATORY_TRACT | Status: DC

## 2020-05-18 MED ORDER — FLUTICASONE FUROATE-VILANTEROL 100-25 MCG/INH IN AEPB
1.0000 | INHALATION_SPRAY | Freq: Every day | RESPIRATORY_TRACT | Status: DC
  Filled 2020-05-18: qty 28

## 2020-05-18 MED ORDER — ACETAMINOPHEN 325 MG PO TABS: 650.0000 mg | ORAL_TABLET | Freq: Four times a day (QID) | ORAL | Status: DC | PRN

## 2020-05-18 MED ORDER — SODIUM CHLORIDE 0.9 % IV SOLN
500.0000 mg | Freq: Once | INTRAVENOUS | Status: AC
  Administered 2020-05-18: 500 mg via INTRAVENOUS
  Filled 2020-05-18: qty 500

## 2020-05-18 MED ORDER — UMECLIDINIUM BROMIDE 62.5 MCG/INH IN AEPB
1.0000 | INHALATION_SPRAY | Freq: Every day | RESPIRATORY_TRACT | Status: DC
  Filled 2020-05-18: qty 7

## 2020-05-18 MED ORDER — AMLODIPINE BESYLATE 5 MG PO TABS
5.0000 mg | ORAL_TABLET | Freq: Every day | ORAL | Status: DC
  Administered 2020-05-18 – 2020-05-19 (×2): 5 mg via ORAL
  Filled 2020-05-18 (×2): qty 1

## 2020-05-18 MED ORDER — ENOXAPARIN SODIUM 120 MG/0.8ML ~~LOC~~ SOLN
120.0000 mg | SUBCUTANEOUS | Status: DC
  Administered 2020-05-18 – 2020-05-19 (×2): 120 mg via SUBCUTANEOUS
  Filled 2020-05-18 (×3): qty 0.8

## 2020-05-18 MED ORDER — FUROSEMIDE 10 MG/ML IJ SOLN
20.0000 mg | Freq: Once | INTRAMUSCULAR | Status: AC
  Administered 2020-05-18: 20 mg via INTRAVENOUS
  Filled 2020-05-18: qty 4

## 2020-05-18 NOTE — Progress Notes (Signed)
  Echocardiogram 2D Echocardiogram has been performed.  Brian [name] Brian [name] 05/18/2020, 1:59 PM

## 2020-05-18 NOTE — H&P (Signed)
History and Physical        Hospital Admission Note Date: 05/18/2020  Patient name: Brian [name] Medical record number: [id number] Date of birth: 1961/04/02 Age: 59 y.o. Gender: male  PCP: [name], MD  Patient coming from: Home   Chief Complaint    Chief Complaint  Patient presents with  . 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  Also noted that his sats were 88 to 90% at rest and dropped as low as 67% on room air with ambulation at home on 2 occasions with symptoms.  Has been taking his Lovenox as prescribed.  Has also been using his inhaler with minimal improvement.  Also with an ongoing cough with mild mucus production which is slightly worse than baseline.  Admits  to occasional slight wheeze.  Admits to new onset orthopnea.  No fever, abdominal pain, nausea or vomiting, no new leg swelling, no diarrhea, no chest pain.  ED Course: Afebrile and hemodynamically stable.  SpO2 86% on room air requiring 2 LPM.  Notable labs: Glucose 156, alk phos 179 (at/below baseline), WBC 12.9, Hb 12.1.  CXR with bandlike opacity in RML consistent with atelectasis/collapse and secondary to known malignancy and unchanged since prior study, opacity in the medial right lung base more pronounced than prior and could represent increased atelectasis or developing infiltrate.  He was started on ceftriaxone and azithromycin for CAP coverage.  Vitals:   05/18/20 1051 05/18/20 1117  BP: 105/77 104/73  Pulse: 81 89  Resp: 16 16  Temp:    SpO2: 92% 93%     Review of Systems:  Review of Systems  All other systems reviewed and are negative.   Medical/Social/Family History   Past Medical History: Past Medical History:  Diagnosis Date  . Asthma   . Diabetes mellitus without complication ([place])   . Hypertension   . nscl ca dx'd 08/2018  . Subarachnoid hemorrhage [facility])     Past Surgical History:  Procedure Laterality Date  . IR IVC FILTER PLMT / S&I /IMG GUID/MOD SED  03/04/2020  . LEG SURGERY  age 68    left leg, from fracture    Medications: Prior to Admission medications   Medication Sig Start Date End Date Taking? Authorizing Provider  albuterol (VENTOLIN HFA) 108 (90 Base) MCG/ACT inhaler Inhale 2 puffs into the lungs every 4 (four) hours as needed for wheezing or shortness of breath. 01/02/20   [name], MD  alectinib ([name]) 150 MG capsule Take 4 capsules (600 mg total) by mouth 2 (two) times daily with a meal. 04/04/20   Tanner, Lyndon Code., PA-C  amLODipine (NORVASC) 5 MG tablet Take 5 mg by mouth daily.  07/20/18   [provider]  atorvastatin (LIPITOR) 20 MG tablet Take 20 mg by mouth daily.  12/10/17   [provider]  benzonatate (TESSALON)  200 MG capsule TAKE ONE CAP 2-3 TIMES A DAY FOR COUGH 05/14/20   Heilingoetter, Cassandra L, PA-C  cholecalciferol (VITAMIN D3) 25 MCG (1000 UNIT) tablet Take 1,000 Units by mouth daily.    [provider]  COVID-19 Specimen Collection KIT See admin instructions. follow package directions Patient not taking: Reported on 05/06/2020 04/19/20   [provider]  enoxaparin (LOVENOX) 120 MG/0.8ML injection Inject 0.8 mLs (120 mg total) into the skin daily. 04/29/20 05/29/20  Heilingoetter, Cassandra L, PA-C  folic acid (FOLVITE) 1 MG tablet Take 1 tablet (1 mg total) by mouth daily. 02/07/20   Heilingoetter, Cassandra L, PA-C  levothyroxine (SYNTHROID) 100 MCG tablet Take 1 tablet (100 mcg total) by mouth daily before breakfast. 05/06/20   Curt Bears, MD  metFORMIN (GLUCOPHAGE) 500 MG tablet Take 500 mg by mouth daily with breakfast.  12/10/17   [provider]  prochlorperazine (COMPAZINE) 10 MG tablet Take 1 tablet (10 mg total) by mouth every 6 (six) hours as needed. 05/03/20   Heilingoetter, Cassandra L, PA-C  Respiratory Therapy Supplies (FLUTTER) DEVI Please use up 10 times daily(4-5 breaths, 4-5 times daily) 03/07/19   Lauraine Rinne, NP  TRELEGY ELLIPTA 100-62.5-25 MCG/INH AEPB Inhale 1 puff into the lungs daily. 01/09/20   [provider]  venlafaxine (EFFEXOR) 37.5 MG tablet Take 37.5 mg by mouth daily.  12/10/17   [provider]    Allergies:  No Known Allergies  Social History:  reports that he has never smoked. He has never used smokeless tobacco. He reports current alcohol use of about 5.0 standard drinks of alcohol per week. He reports that he does not use drugs.  Family History: Family History  Problem Relation Age of Onset  . Colon cancer Neg Hx   . Stomach cancer Neg Hx      Objective   Physical Exam: Blood pressure 104/73, pulse 89, temperature 98.3 F (36.8 C), temperature source Oral, resp. rate 16, SpO2 93 %.  Physical  Exam Vitals and nursing note reviewed.  Constitutional:      Appearance: Normal appearance.  HENT:     Head: Normocephalic and atraumatic.  Eyes:     Conjunctiva/sclera: Conjunctivae normal.  Neck:     Comments: JVD to mid neck Cardiovascular:     Rate and Rhythm: Normal rate and regular rhythm.     Heart sounds: No murmur heard.   Pulmonary:     Breath sounds: Examination of the right-lower field reveals rales. Rales present.     Comments: On 2 LPM via [place] I had the patient sit upright to listen to his posterior chest and he had significant shortness of breath with this maneuver. Positive orthopnea Abdominal:     General: Abdomen is flat.     Palpations: Abdomen is soft.  Musculoskeletal:        General: No swelling or tenderness.  Skin:  Coloration: Skin is not jaundiced or pale.  Neurological:     Mental Status: He is alert. Mental status is at baseline.  Psychiatric:        Mood and Affect: Mood normal.        Behavior: Behavior normal.     LABS on Admission: I have personally reviewed all the labs and imaging below    Basic Metabolic Panel: Recent Labs  Lab 05/18/20 0833  NA 136  K 3.6  CL 106  CO2 21*  GLUCOSE 156*  BUN 16  CREATININE 0.93  CALCIUM 8.6*   Liver Function Tests: Recent Labs  Lab 05/18/20 0833  AST 36  ALT 31  ALKPHOS 179*  BILITOT 0.8  PROT 5.9*  ALBUMIN 3.2*   No results for input(s): LIPASE, AMYLASE in the last 168 hours. No results for input(s): AMMONIA in the last 168 hours. CBC: Recent Labs  Lab 05/18/20 0833  WBC 12.9*  NEUTROABS 10.2*  HGB 12.1*  HCT 36.6*  MCV 97.1  PLT 322   Cardiac Enzymes: No results for input(s): CKTOTAL, CKMB, CKMBINDEX, TROPONINI in the last 168 hours. BNP: Invalid input(s): POCBNP CBG: No results for input(s): GLUCAP in the last 168 hours.  Radiological Exams on Admission:  DG Chest 2 View  Result Date: 05/18/2020 CLINICAL DATA:  Shortness of breath for 2 weeks with a productive  cough. Increased use of rescue inhaler. History of small cell lung cancer. EXAM: CHEST - 2 VIEW COMPARISON:  May 13, 2020 FINDINGS: No pneumothorax. Bandlike opacity remains in the right mid lung, unchanged in the interval. The heart, hila, mediastinum are unchanged. The left lung is clear. No new focal infiltrates on the right. Mild increased opacity in the medial right base, slightly more pronounced in the interval. No other acute abnormalities or changes. IMPRESSION: 1. Bandlike opacity in the right mid lung is unchanged consistent with atelectasis/collapse, noted be secondary to the patient's known malignancy on the May 03, 2020 CT scan. 2. Opacity in the medial right lung base is mildly more pronounced in the interval. This could represent increasing atelectasis or developing infiltrate. Recommend attention on follow-up. 3. No other interval changes. Electronically Signed   By: Dorise Bullion III M.D   On: 05/18/2020 09:41      EKG: Independently reviewed.    A & P   Principal Problem:   Acute respiratory failure with hypoxia (HCC) Active Problems:   Centrilobular emphysema (HCC)   Venous thrombosis   Adenocarcinoma of right lung, stage 4 (HCC)   Essential hypertension   Type 2 diabetes mellitus with hyperlipidemia ([place])   1. Acute hypoxic respiratory failure in the setting of metastatic lung cancer and recurrent right pleural effusion (s/p right-sided thoracentesis on 05/13/2020) a. DDx: New onset heart failure vs. CAP vs. PE vs. other b. + New onset orthopnea, dyspnea and hypoxia with minimal exertion, mild JVD, fluid in the right fissure on personal read of CXR and history of chemoradiation to chest concerning for new onset heart failure i. BNP ii. Echo iii. Lasix 20 mg IV x1 iv. Telemetry c. Afebrile but with leukocytosis and on chemotherapy i. Continue empiric CAP coverage for now d. Has a history of DVT in the setting malignancy currently on Lovenox and with an IVC  filter and now with intermittent right-sided chest discomfort -could be a PE but unlikely given already on therapeutic anticoagulation i. Continue Lovenox ii. If above work-up is negative and/or no improvement with above treatments then would consider checking  for a PE e. Continue home Trelegy and as needed albuterol f. Incentive spirometry for atelectasis  2. Metastatic non-small cell lung cancer a. Follows with Dr. Earlie Server b. Holding [name] in the setting of possible infection  3. Centrilobular emphysema a. Continue home Trelegy and as needed albuterol  4. Hypertension a. Continue amlodipine  5. Hyperlipidemia a. Continue statin  6. Diabetes a. Hold Metformin b. Sensitive sliding scale   DVT prophylaxis: Lovenox   Code Status: Prior  Diet: Heart healthy/carb modified Family Communication: Admission, patients condition and plan of care including tests being ordered have been discussed with the patient who indicates understanding and agrees with the plan and Code Status. Patient's wife was updated  Disposition Plan: The appropriate patient status for this patient is INPATIENT. Inpatient status is judged to be reasonable and necessary in order to provide the required intensity of service to ensure the patient's safety. The patient's presenting symptoms, physical exam findings, and initial radiographic and laboratory data in the context of their chronic comorbidities is felt to place them at high risk for further clinical deterioration. Furthermore, it is not anticipated that the patient will be medically stable for discharge from the hospital within 2 midnights of admission. The following factors support the patient status of inpatient.   " The patient's presenting symptoms include shortness of breath, DOE, hypoxia. " The worrisome physical exam findings include orthopnea, Rales. " The initial radiographic and laboratory data are worrisome because of leukocytosis,  atelectasis. " The chronic co-morbidities include lung cancer, hypertension, hyperlipidemia, diabetes.   * I certify that at the point of admission it is my clinical judgment that the patient will require inpatient hospital care spanning beyond 2 midnights from the point of admission due to high intensity of service, high risk for further deterioration and high frequency of surveillance required.*   Status is: Inpatient  Remains inpatient appropriate because:IV treatments appropriate due to intensity of illness or inability to take PO and Inpatient level of care appropriate due to severity of illness   Dispo: The patient is from: Home              Anticipated d/c is to: Home              Anticipated d/c date is: 3 days              Patient currently is not medically stable to d/c.         The medical decision making on this patient was of high complexity and the patient is at high risk for clinical deterioration, therefore this is a level 3 admission.  Consultants  . None  Procedures  . None  Time Spent on Admission: 72 minutes    Harold Hedge, DO [facility]  05/18/2020, 11:40 AM

## 2020-05-18 NOTE — ED Triage Notes (Signed)
SOB x2 weeks. States he has had multiple episodes where he felt he could not catch his breath. Has been using rescue inhaler approx 3 x per day 2 puffs.   Currently receiving chemotherapy   Patient on Lovenox 1/day  Fully vaccinated against COVID 19, COVID booster approximately a month ago

## 2020-05-18 NOTE — ED Provider Notes (Signed)
[facility] DEPT Provider Note   CSN: [id number] Arrival date & time: 05/18/20  0758     History Chief Complaint  Patient presents with   Shortness of Breath    Brian [name] is a 59 y.o. male.  Patient is a 59 year old male with a history of diabetes, hypertension, asthma, stage IV lung cancer on chronic oral chemotherapy who had a thoracentesis recently with 1 L removed who presents today with persistent shortness of breath.  He reports that the first time he had a for thoracentesis of a half of a liter removed he had immediate improvement in his shortness of breath however he had a liter removed this week and he has had ongoing shortness of breath.  The shortness of breath is worse with exertion and better with sitting but at rest his sats have been 88 to 90% at best and with ambulation has dropped to as low as 69% on room air.  Patient does not wear oxygen at home.  He is taking Lovenox shots due to a DVT and has no known history of PE.  He has been using his inhaler at home when he becomes extremely short of breath and it helps minimally.  He has had an ongoing cough with mild mucus production which he feels is a little worse than baseline.  He has had no fever, abdominal pain, nausea or vomiting.  He has not noticed any new leg swelling.  He has had no diarrhea.  He has been vaccinated against Covid and received [employer] booster this month.  He denies any chest pain and has no known heart history.  The history is provided by the patient.  Shortness of Breath      Past Medical History:  Diagnosis Date   Asthma    Diabetes mellitus without complication ([place])    Hypertension    nscl ca dx'd 08/2018   Subarachnoid hemorrhage [facility])     Patient Active Problem List   Diagnosis Date Noted   S/P thoracentesis 03/21/2020   Brain metastases ([place]) 03/05/2020   Calf swelling 02/21/2020   Rash 02/07/2020   Adenocarcinoma of right lung, stage  4 ([place]) 02/07/2020   Chronic cough 12/20/2019   Venous thrombosis 09/21/2019   Shortness of breath 09/07/2019   Headache 08/09/2019   Lobar pneumonia, unspecified organism ([place]) 03/07/2019   Centrilobular emphysema ([place]) 03/07/2019   Encounter for antineoplastic immunotherapy 11/10/2018   Adenocarcinoma of right lung, stage 3 ([place]) 08/25/2018   Goals of care, counseling/discussion 08/25/2018   Encounter for antineoplastic chemotherapy 08/25/2018   Mass of upper lobe of right lung 08/04/2018   Subarachnoid hemorrhage ([place]) 05/22/2015    Past Surgical History:  Procedure Laterality Date   IR IVC FILTER PLMT / S&I /IMG GUID/MOD SED  03/04/2020   LEG SURGERY  age 43    left leg, from fracture       Family History  Problem Relation Age of Onset   Colon cancer Neg Hx    Stomach cancer Neg Hx     Social History   Tobacco Use   Smoking status: Never Smoker   Smokeless tobacco: Never Used  Vaping Use   Vaping Use: Never used  Substance Use Topics   Alcohol use: Yes    Alcohol/week: 5.0 standard drinks    Types: 5 Standard drinks or equivalent per week    Comment: occasionally   Drug use: No    Home Medications Prior to Admission medications  Medication Sig Start Date End Date Taking? Authorizing Provider  albuterol (VENTOLIN HFA) 108 (90 Base) MCG/ACT inhaler Inhale 2 puffs into the lungs every 4 (four) hours as needed for wheezing or shortness of breath. 01/02/20   [name], MD  alectinib ([name]) 150 MG capsule Take 4 capsules (600 mg total) by mouth 2 (two) times daily with a meal. 04/04/20   Tanner, Lyndon Code., PA-C  amLODipine (NORVASC) 5 MG tablet Take 5 mg by mouth daily.  07/20/18   [provider]  atorvastatin (LIPITOR) 20 MG tablet Take 20 mg by mouth daily.  12/10/17   [provider]  benzonatate (TESSALON) 200 MG capsule TAKE ONE CAP 2-3 TIMES A DAY FOR COUGH 05/14/20   Heilingoetter, Cassandra L, PA-C  cholecalciferol  (VITAMIN D3) 25 MCG (1000 UNIT) tablet Take 1,000 Units by mouth daily.    [provider]  COVID-19 Specimen Collection KIT See admin instructions. follow package directions Patient not taking: Reported on 05/06/2020 04/19/20   [provider]  enoxaparin (LOVENOX) 120 MG/0.8ML injection Inject 0.8 mLs (120 mg total) into the skin daily. 04/29/20 05/29/20  Heilingoetter, Cassandra L, PA-C  folic acid (FOLVITE) 1 MG tablet Take 1 tablet (1 mg total) by mouth daily. 02/07/20   Heilingoetter, Cassandra L, PA-C  levothyroxine (SYNTHROID) 100 MCG tablet Take 1 tablet (100 mcg total) by mouth daily before breakfast. 05/06/20   Curt Bears, MD  metFORMIN (GLUCOPHAGE) 500 MG tablet Take 500 mg by mouth daily with breakfast.  12/10/17   [provider]  prochlorperazine (COMPAZINE) 10 MG tablet Take 1 tablet (10 mg total) by mouth every 6 (six) hours as needed. 05/03/20   Heilingoetter, Cassandra L, PA-C  Respiratory Therapy Supplies (FLUTTER) DEVI Please use up 10 times daily(4-5 breaths, 4-5 times daily) 03/07/19   Lauraine Rinne, NP  TRELEGY ELLIPTA 100-62.5-25 MCG/INH AEPB Inhale 1 puff into the lungs daily. 01/09/20   [provider]  venlafaxine (EFFEXOR) 37.5 MG tablet Take 37.5 mg by mouth daily.  12/10/17   [provider]    Allergies    Patient has no known allergies.  Review of Systems   Review of Systems  Respiratory: Positive for shortness of breath.   All other systems reviewed and are negative.   Physical Exam Updated Vital Signs BP 124/75 (BP Location: Right Arm)    Pulse 94    Temp 98.3 F (36.8 C) (Oral)    Resp 17    SpO2 96%   Physical Exam Vitals and nursing note reviewed.  Constitutional:      General: He is not in acute distress.    Appearance: He is well-developed and normal weight.  HENT:     Head: Normocephalic and atraumatic.  Eyes:     Conjunctiva/sclera: Conjunctivae normal.     Pupils: Pupils are equal, round, and reactive  to light.  Cardiovascular:     Rate and Rhythm: Regular rhythm. Tachycardia present.     Heart sounds: No murmur heard.   Pulmonary:     Effort: Pulmonary effort is normal. No respiratory distress.     Breath sounds: Normal breath sounds. No wheezing or rales.     Comments: Breath sounds are equal in all 4 lobes without wheezing no notable rhonchi or rales Abdominal:     General: There is no distension.     Palpations: Abdomen is soft.     Tenderness: There is no abdominal tenderness. There is no guarding or rebound.  Musculoskeletal:  General: No tenderness. Normal range of motion.     Cervical back: Normal range of motion and neck supple.     Right lower leg: No tenderness. No edema.     Left lower leg: No tenderness. No edema.  Skin:    General: Skin is warm and dry.     Findings: No erythema or rash.  Neurological:     General: No focal deficit present.     Mental Status: He is alert and oriented to person, place, and time.  Psychiatric:        Mood and Affect: Mood normal.        Behavior: Behavior normal.     ED Results / Procedures / Treatments   Labs (all labs ordered are listed, but only abnormal results are displayed) Labs Reviewed  CBC WITH DIFFERENTIAL/PLATELET - Abnormal; Notable for the following components:      Result Value   WBC 12.9 (*)    RBC 3.77 (*)    Hemoglobin 12.1 (*)    HCT 36.6 (*)    RDW 16.8 (*)    Neutro Abs 10.2 (*)    Lymphs Abs 0.6 (*)    Monocytes Absolute 1.5 (*)    Basophils Absolute 0.2 (*)    Abs Immature Granulocytes 0.15 (*)    All other components within normal limits  COMPREHENSIVE METABOLIC PANEL - Abnormal; Notable for the following components:   CO2 21 (*)    Glucose, Bld 156 (*)    Calcium 8.6 (*)    Total Protein 5.9 (*)    Albumin 3.2 (*)    Alkaline Phosphatase 179 (*)    All other components within normal limits  RESPIRATORY PANEL BY RT PCR (FLU A&B, COVID)    EKG EKG  Interpretation  Date/Time:  Saturday May 18 2020 08:25:55 EDT Ventricular Rate:  90 PR Interval:    QRS Duration: 95 QT Interval:  353 QTC Calculation: 432 R Axis:   79 Text Interpretation: Sinus rhythm No significant change since last tracing Confirmed by Blanchie Dessert [phone]) on 05/18/2020 8:28:12 AM   Radiology DG Chest 2 View  Result Date: 05/18/2020 CLINICAL DATA:  Shortness of breath for 2 weeks with a productive cough. Increased use of rescue inhaler. History of small cell lung cancer. EXAM: CHEST - 2 VIEW COMPARISON:  May 13, 2020 FINDINGS: No pneumothorax. Bandlike opacity remains in the right mid lung, unchanged in the interval. The heart, hila, mediastinum are unchanged. The left lung is clear. No new focal infiltrates on the right. Mild increased opacity in the medial right base, slightly more pronounced in the interval. No other acute abnormalities or changes. IMPRESSION: 1. Bandlike opacity in the right mid lung is unchanged consistent with atelectasis/collapse, noted be secondary to the patient's known malignancy on the May 03, 2020 CT scan. 2. Opacity in the medial right lung base is mildly more pronounced in the interval. This could represent increasing atelectasis or developing infiltrate. Recommend attention on follow-up. 3. No other interval changes. Electronically Signed   By: Dorise Bullion III M.D   On: 05/18/2020 09:41    Procedures Procedures (including critical care time)  Medications Ordered in ED Medications - No data to display  ED Course  I have reviewed the triage vital signs and the nursing notes.  Pertinent labs & imaging results that were available during my care of the patient were reviewed by me and considered in my medical decision making (see chart for details).    MDM  Rules/Calculators/A&P                          Patient with multiple medical problems presenting today with shortness of breath.  Patient was hypoxic upon arrival  at 86% on room air.  Saturations improved to 96% on 2 L and patient reports he feels much better.  This has been ongoing for the last week even since having a thoracentesis with 1 L removed.  Patient denies fever but has had mild worsening cough and sputum production.  Possible new pneumonia versus COVID versus recurrent effusion.  Lower suspicion for asthma exacerbation as patient has no wheezing on exam.  Patient's breath sounds are clear bilaterally and lower suspicion for pneumothorax at this time.  Patient has no chest pain and EKG is within normal limits with lower suspicion for ACS.  Patient does take Lovenox shots daily with lower suspicion for PE.  Labs and imaging are pending.  Patient will need admission for hypoxic respiratory failure.  10:15 AM CBC with leukocytosis of 12.9, CMP wihtout acute changes.  Chest x-ray showing no evidence of pneumothorax, bandlike opacity in the right midlung is unchanged from CT in October but there is opacity in the medial right lung base which is more pronounced and could represent atelectasis or developing infiltrate.  Given patient's elevating white count and new oxygen requirement with low suspicion that this is PE will admit for IV antibiotics and oxygen.  MDM Number of Diagnoses or Management Options   Amount and/or Complexity of Data Reviewed Clinical lab tests: ordered and reviewed Tests in the radiology section of CPT: ordered and reviewed Tests in the medicine section of CPT: ordered and reviewed Decide to obtain previous medical records or to obtain history from someone other than the patient: yes Obtain history from someone other than the patient: no Review and summarize past medical records: yes Discuss the patient with other providers: yes Independent visualization of images, tracings, or specimens: yes  Risk of Complications, Morbidity, and/or Mortality Presenting problems: high Diagnostic procedures: low Management options:  low  Patient Progress Patient progress: improved    Final Clinical Impression(s) / ED Diagnoses Final diagnoses:  Community acquired pneumonia of right lower lobe of lung  Acute respiratory failure with hypoxia [facility])    Rx / DC Orders ED Discharge Orders    None       Blanchie Dessert, MD 05/18/20 1018

## 2020-05-19 DIAGNOSIS — J9601 Acute respiratory failure with hypoxia: Secondary | ICD-10-CM | POA: Diagnosis present

## 2020-05-19 LAB — CBC
HCT: 36.2 % — ABNORMAL LOW (ref 39.0–52.0)
Hemoglobin: 12 g/dL — ABNORMAL LOW (ref 13.0–17.0)
MCH: 32.1 pg (ref 26.0–34.0)
MCHC: 33.1 g/dL (ref 30.0–36.0)
MCV: 96.8 fL (ref 80.0–100.0)
Platelets: 303 10*3/uL (ref 150–400)
RBC: 3.74 MIL/uL — ABNORMAL LOW (ref 4.22–5.81)
RDW: 16.4 % — ABNORMAL HIGH (ref 11.5–15.5)
WBC: 12.4 10*3/uL — ABNORMAL HIGH (ref 4.0–10.5)
nRBC: 0 % (ref 0.0–0.2)

## 2020-05-19 LAB — BASIC METABOLIC PANEL
Anion gap: 10 (ref 5–15)
BUN: 17 mg/dL (ref 6–20)
CO2: 23 mmol/L (ref 22–32)
Calcium: 8.7 mg/dL — ABNORMAL LOW (ref 8.9–10.3)
Chloride: 103 mmol/L (ref 98–111)
Creatinine, Ser: 0.93 mg/dL (ref 0.61–1.24)
GFR, Estimated: >60 mL/min (ref >60)
Glucose, Bld: 93 mg/dL (ref 70–99)
Potassium: 3.9 mmol/L (ref 3.5–5.1)
Sodium: 136 mmol/L (ref 135–145)

## 2020-05-19 LAB — GLUCOSE, CAPILLARY: Glucose-Capillary: 110 mg/dL — ABNORMAL HIGH (ref 70–99)

## 2020-05-19 LAB — CBG MONITORING, ED
Glucose-Capillary: 98 mg/dL (ref 70–99)
Glucose-Capillary: 166 mg/dL — ABNORMAL HIGH (ref 70–99)
Glucose-Capillary: 88 mg/dL (ref 70–99)

## 2020-05-19 LAB — HIV ANTIBODY (ROUTINE TESTING W REFLEX): HIV Screen 4th Generation wRfx: NONREACTIVE

## 2020-05-19 MED ORDER — GUAIFENESIN ER 600 MG PO TB12
600.0000 mg | ORAL_TABLET | Freq: Two times a day (BID) | ORAL | Status: DC
  Administered 2020-05-19 – 2020-05-20 (×3): 600 mg via ORAL
  Filled 2020-05-19 (×3): qty 1

## 2020-05-19 MED ORDER — FLUTICASONE-UMECLIDIN-VILANT 100-62.5-25 MCG/INH IN AEPB
1.0000 | INHALATION_SPRAY | Freq: Every day | RESPIRATORY_TRACT | Status: DC
  Administered 2020-05-19: 1 via RESPIRATORY_TRACT
  Filled 2020-05-19: qty 1

## 2020-05-19 NOTE — Progress Notes (Signed)
PROGRESS NOTE    Brian [name]  FGH:[id number] DOB: [birth year] DOA: 05/18/2020 PCP: [name], MD    Chief Complaint  Patient presents with  . Shortness of Breath    Brief Narrative:   H/o COPD not on home o2, h/o metastatic non-small cell lung cancer, ALK positive, on [name] and follows with Dr. 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 previously on Xarelto and now on Lovenox with IVC filter (placed on 03/04/2020), recurrent pleural effusion s/p US guided right thoracentesis on 05/13/2020,presents with persistent and worsening shortness of breath at rest and dyspnea on exertion, hypoxia and intermittent mild right-sided chest discomfort x2 weeks  Subjective:  He is feeling better, but still requiring oxygen supplement Report upper chest tightness when trying to take a deep breath  Assessment & Plan:   Principal Problem:   Acute respiratory failure with hypoxia ([place]) Active Problems:   Centrilobular emphysema (HCC)   Venous thrombosis   Adenocarcinoma of right lung, stage 4 (HCC)   Essential hypertension   Type 2 diabetes mellitus with hyperlipidemia (HCC)   Acute hypoxemic respiratory failure (HCC)   Acute hypoxic respiratory failure, not on home o2 at baseline - SpO2 86% on room air in the ED initially -currently on 4liter o2, o2 sats 91 at rest, when asked to take deep breath, o2 went up to 94-95% -cxr personally reviewed, no interval changes from one obtained after thoracentesis on October 11 -He is on chronic anticoagulation, less likely PE, he does not appear volume overloaded on exam today, he received IV Lasix 20 mgx1  yesterday, echocardiogram left ventricular EF within normal limit, no diastolic dysfunction -He does has leukocytosis, lung exam with few crackles left middle lobe, likely component of bronchitis possible mild pneumonia, appear responding to antibiotics,  will continue, add on Mucinex, incentive spirometer -Wean oxygen as able, home O2 eval   COPD; no wheezing on exam, no indication for steroids, continue home meds  Hypertension: Blood pressure low normal ,will hold home blood pressure medication Norvasc for now  Hyperlipidemia continue statin  Noninsulin-dependent type 2 diabetes, fairly controlled Hold Metformin, on SSI here  Hypothyroidism: Continue Synthroid supplement   DVT prophylaxis: Fully anticoagulated   Code Status: DNR Family Communication: Patient Disposition:   Status is: Inpatient  Dispo: The patient is from: home              Anticipated d/c is to: home              Anticipated d/c date is: Likely tomorrow, will need home O2 eval                Consultants:   None  Procedures:   None  Antimicrobials:   Rocephin and Zithromax     Objective: Vitals:   05/19/20 0028 05/19/20 0426 05/19/20 0723 05/19/20 0759  BP: 109/77 110/73  110/73  Pulse: 88 89  85  Resp: (!) 29 17  (!) 27  Temp:  97.9 F (36.6 C)  98.2 F (36.8 C)  TempSrc:  Oral Oral Oral  SpO2: 92% 96%  95%  Weight:      Height:        Intake/Output Summary (Last 24 hours) at 05/19/2020 0801 Last data filed at 05/18/2020 1751 Gross per 24 hour  Intake 250 ml  Output --  Net 250 ml   Filed Weights   05/18/20 1944  Weight: 77.1 kg    Examination:  General exam: calm, NAD Respiratory system: mild crackles mid left lung, no rhonchi, no wheezing,  Respiratory effort normal. Cardiovascular system: S1 & S2 heard, RRR. No JVD, no murmur, No pedal edema. Gastrointestinal system: Abdomen is nondistended, soft and nontender. No organomegaly or masses felt. Normal bowel sounds heard. Central nervous system: Alert and oriented. No focal neurological deficits. Extremities: Symmetric 5 x 5 power. Skin: No rashes, lesions or ulcers Psychiatry: Judgement and insight appear normal. Mood & affect appropriate.     Data Reviewed: I have  personally reviewed following labs and imaging studies  CBC: Recent Labs  Lab 05/18/20 0833 05/19/20 0429  WBC 12.9* 12.4*  NEUTROABS 10.2*  --   HGB 12.1* 12.0*  HCT 36.6* 36.2*  MCV 97.1 96.8  PLT 322 242    Basic Metabolic Panel: Recent Labs  Lab 05/18/20 0833 05/19/20 0429  NA 136 136  K 3.6 3.9  CL 106 103  CO2 21* 23  GLUCOSE 156* 93  BUN 16 17  CREATININE 0.93 0.93  CALCIUM 8.6* 8.7*    GFR: Estimated Creatinine Clearance: 85.8 mL/min (by C-G formula based on SCr of 0.93 mg/dL).  Liver Function Tests: Recent Labs  Lab 05/18/20 0833  AST 36  ALT 31  ALKPHOS 179*  BILITOT 0.8  PROT 5.9*  ALBUMIN 3.2*    CBG: Recent Labs  Lab 05/18/20 1251 05/18/20 1718 05/18/20 2222 05/19/20 0757  GLUCAP 89 109* 93 98     Recent Results (from the past 240 hour(s))  SARS CORONAVIRUS 2 (TAT 6-24 HRS) Nasopharyngeal Nasopharyngeal Swab     Status: None   Collection Time: 05/10/20 11:09 AM   Specimen: Nasopharyngeal Swab  Result Value Ref Range Status   SARS Coronavirus 2 NEGATIVE NEGATIVE Final    Comment: (NOTE) SARS-CoV-2 target nucleic acids are NOT DETECTED.  The SARS-CoV-2 RNA is generally detectable in upper and lower respiratory specimens during the acute phase of infection. Negative results do not preclude SARS-CoV-2 infection, do not rule out co-infections with other pathogens, and should not be used as the sole basis for treatment or other patient management decisions. Negative results must be combined with clinical observations, patient history, and epidemiological information. The expected result is Negative.  Fact Sheet for Patients: SugarRoll.be  Fact Sheet for Healthcare Providers: https://www.woods-mathews.com/  This test is not yet approved or cleared by the [place] FDA and  has been authorized for detection and/or diagnosis of SARS-CoV-2 by FDA under an Emergency Use Authorization (EUA).  This EUA will remain  in effect (meaning this test can be used) for the duration of the COVID-19 declaration under Se ction 564(b)(1) of the Act, 21 U.S.C. section 360bbb-3(b)(1), unless the authorization is terminated or revoked sooner.  Performed at [facility], [place] [address], [place], [place] [address]   Respiratory Panel by RT PCR (Flu A&B, Covid) - Nasopharyngeal Swab     Status: None   Collection Time: 05/18/20  8:23 AM   Specimen: Nasopharyngeal Swab  Result Value Ref Range Status   SARS Coronavirus 2 by RT PCR NEGATIVE NEGATIVE Final    Comment: (NOTE) SARS-CoV-2 target nucleic acids are NOT DETECTED.  The SARS-CoV-2 RNA is generally detectable in upper respiratoy specimens during the acute phase of infection. The lowest concentration of SARS-CoV-2 viral copies this assay can detect is 131 copies/mL. A negative result does not preclude SARS-Cov-2 infection and should not be used as the sole basis for treatment or other patient management decisions. A negative result  may occur with  improper specimen collection/handling, submission of specimen other than nasopharyngeal swab, presence of viral mutation(s) within the areas targeted by this assay, and inadequate number of viral copies (<131 copies/mL). A negative result must be combined with clinical observations, patient history, and epidemiological information. The expected result is Negative.  Fact Sheet for Patients:  PinkCheek.be  Fact Sheet for Healthcare Providers:  GravelBags.it  This test is no t yet approved or cleared by the [place] FDA and  has been authorized for detection and/or diagnosis of SARS-CoV-2 by FDA under an Emergency Use Authorization (EUA). This EUA will remain  in effect (meaning this test can be used) for the duration of the COVID-19 declaration under Section 564(b)(1) of the Act, 21 U.S.C. section 360bbb-3(b)(1), unless the  authorization is terminated or revoked sooner.     Influenza A by PCR NEGATIVE NEGATIVE Final   Influenza B by PCR NEGATIVE NEGATIVE Final    Comment: (NOTE) The Xpert Xpress SARS-CoV-2/FLU/RSV assay is intended as an aid in  the diagnosis of influenza from Nasopharyngeal swab specimens and  should not be used as a sole basis for treatment. Nasal washings and  aspirates are unacceptable for Xpert Xpress SARS-CoV-2/FLU/RSV  testing.  Fact Sheet for Patients: PinkCheek.be  Fact Sheet for Healthcare Providers: GravelBags.it  This test is not yet approved or cleared by the [place] FDA and  has been authorized for detection and/or diagnosis of SARS-CoV-2 by  FDA under an Emergency Use Authorization (EUA). This EUA will remain  in effect (meaning this test can be used) for the duration of the  Covid-19 declaration under Section 564(b)(1) of the Act, 21  U.S.C. section 360bbb-3(b)(1), unless the authorization is  terminated or revoked. Performed at [facility], [place] [address], [place], [place] [address]          Radiology Studies: DG Chest 2 View  Result Date: 05/18/2020 CLINICAL DATA:  Shortness of breath for 2 weeks with a productive cough. Increased use of rescue inhaler. History of small cell lung cancer. EXAM: CHEST - 2 VIEW COMPARISON:  May 13, 2020 FINDINGS: No pneumothorax. Bandlike opacity remains in the right mid lung, unchanged in the interval. The heart, hila, mediastinum are unchanged. The left lung is clear. No new focal infiltrates on the right. Mild increased opacity in the medial right base, slightly more pronounced in the interval. No other acute abnormalities or changes. IMPRESSION: 1. Bandlike opacity in the right mid lung is unchanged consistent with atelectasis/collapse, noted be secondary to the patient's known malignancy on the May 03, 2020 CT scan. 2. Opacity in the  medial right lung base is mildly more pronounced in the interval. This could represent increasing atelectasis or developing infiltrate. Recommend attention on follow-up. 3. No other interval changes. Electronically Signed   By: Dorise Bullion III M.D   On: 05/18/2020 09:41   ECHOCARDIOGRAM COMPLETE  Result Date: 05/18/2020    ECHOCARDIOGRAM REPORT   Patient Name:   ANIKET PAYE Date of Exam: 05/18/2020 Medical Rec #:  [id number]      Height:       68.0 in Accession #:    [id number]     Weight:       177.2 lb Date of Birth:  January 22, 1961     BSA:          1.941 m Patient Age:    60 years       BP:  103/70 mmHg Patient Gender: M              HR:           86 bpm. Exam Location:  Inpatient Procedure: 2D Echo, Cardiac Doppler and Color Doppler Indications:    Dyspnea 786.09 / R06.00  History:        Patient has no prior history of Echocardiogram examinations.                 Risk Factors:Hypertension and Diabetes.  Sonographer:    Jonelle Sidle Dance Referring Phys: [id number] [place]  1. Left ventricular ejection fraction, by estimation, is 55 to 60%. The left ventricle has normal function. The left ventricle has no regional wall motion abnormalities. Left ventricular diastolic parameters were normal.  2. Right ventricular systolic function is normal. The right ventricular size is normal. Tricuspid regurgitation signal is inadequate for assessing PA pressure.  3. The mitral valve is normal in structure. Trivial mitral valve regurgitation. No evidence of mitral stenosis.  4. The aortic valve is tricuspid. There is mild calcification of the aortic valve. Aortic valve regurgitation is not visualized. No aortic stenosis is present.  5. The inferior vena cava is normal in size with greater than 50% respiratory variability, suggesting right atrial pressure of 3 mmHg. FINDINGS  Left Ventricle: Left ventricular ejection fraction, by estimation, is 55 to 60%. The left ventricle has normal function. The  left ventricle has no regional wall motion abnormalities. The left ventricular internal cavity size was normal in size. There is  no left ventricular hypertrophy. Left ventricular diastolic parameters were normal. Right Ventricle: The right ventricular size is normal. No increase in right ventricular wall thickness. Right ventricular systolic function is normal. Tricuspid regurgitation signal is inadequate for assessing PA pressure. Left Atrium: Left atrial size was normal in size. Right Atrium: Right atrial size was normal in size. Pericardium: There is no evidence of pericardial effusion. Mitral Valve: The mitral valve is normal in structure. Trivial mitral valve regurgitation. No evidence of mitral valve stenosis. Tricuspid Valve: The tricuspid valve is normal in structure. Tricuspid valve regurgitation is trivial. No evidence of tricuspid stenosis. Aortic Valve: The aortic valve is tricuspid. There is mild calcification of the aortic valve. Aortic valve regurgitation is not visualized. No aortic stenosis is present. Pulmonic Valve: The pulmonic valve was normal in structure. Pulmonic valve regurgitation is trivial. No evidence of pulmonic stenosis. Aorta: The aortic root is normal in size and structure. Venous: The inferior vena cava is normal in size with greater than 50% respiratory variability, suggesting right atrial pressure of 3 mmHg. IAS/Shunts: No atrial level shunt detected by color flow Doppler.  LEFT VENTRICLE PLAX 2D LVIDd:         4.40 cm  Diastology LVIDs:         2.60 cm  LV e' medial:    6.85 cm/s LV PW:         0.90 cm  LV E/e' medial:  10.2 LV IVS:        0.80 cm  LV e' lateral:   10.10 cm/s LVOT diam:     2.10 cm  LV E/e' lateral: 6.9 LV SV:         63 LV SV Index:   32 LVOT Area:     3.46 cm  RIGHT VENTRICLE             IVC RV Basal diam:  2.80 cm     IVC diam:  1.80 cm RV S prime:     13.40 cm/s TAPSE (M-mode): 1.7 cm LEFT ATRIUM             Index       RIGHT ATRIUM           Index LA diam:         3.60 cm 1.85 cm/m  RA Area:     10.60 cm LA Vol (A2C):   39.8 ml 20.50 ml/m RA Volume:   21.50 ml  11.08 ml/m LA Vol (A4C):   22.3 ml 11.49 ml/m LA Biplane Vol: 30.3 ml 15.61 ml/m  AORTIC VALVE LVOT Vmax:   107.00 cm/s LVOT Vmean:  64.500 cm/s LVOT VTI:    0.181 m  AORTA Ao Root diam: 3.10 cm Ao Asc diam:  2.80 cm MITRAL VALVE MV Area (PHT): 2.76 cm    SHUNTS MV Decel Time: 275 msec    Systemic VTI:  0.18 m MV E velocity: 69.70 cm/s  Systemic Diam: 2.10 cm MV A velocity: 67.70 cm/s MV E/A ratio:  1.03 Cherlynn Kaiser MD Electronically signed by Cherlynn Kaiser MD Signature Date/Time: 05/18/2020/4:01:13 PM    Final         Scheduled Meds: . amLODipine  5 mg Oral Daily  . atorvastatin  20 mg Oral Daily  . enoxaparin  120 mg Subcutaneous Q24H  . fluticasone furoate-vilanterol  1 puff Inhalation Daily   And  . umeclidinium bromide  1 puff Inhalation Daily  . insulin aspart  0-9 Units Subcutaneous TID WC  . levothyroxine  100 mcg Oral QAC breakfast  . multivitamin with minerals  1 tablet Oral Daily  . sodium chloride flush  3 mL Intravenous Q12H  . venlafaxine  37.5 mg Oral Daily   Continuous Infusions: . azithromycin Stopped (05/18/20 1751)  . cefTRIAXone (ROCEPHIN)  IV Stopped (05/18/20 1758)     LOS: 1 day   Time spent: 10mns Greater than 50% of this time was spent in counseling, explanation of diagnosis, planning of further management, and coordination of care.  I have personally reviewed and interpreted on  05/19/2020 daily labs,  imagings as discussed above under date review session and assessment and plans.  I reviewed all nursing notes, pharmacy notes,  vitals, pertinent old records  I have discussed plan of care as described above with RN , patient on 05/19/2020  Voice Recognition /Dragon dictation system was used to create this note, attempts have been made to correct errors. Please contact the author with questions and/or clarifications.   FFlorencia Reasons MD PhD  FACP [facility]  Available via Epic secure chat 7am-7pm for nonurgent issues Please page for urgent issues To page the attending provider between 7A-7P or the covering provider during after hours 7P-7A, please log into the web site [email or web address] and access using universal [place] password for that web site. If you do not have the password, please call the hospital operator.    05/19/2020, 8:01 AM

## 2020-05-19 NOTE — ED Notes (Signed)
PT refused both inhalers states he uses trelegy at home. Wife brought med from home and pt has been using it. Dr Annamaria Boots notified and said to send med to pharm for verification.

## 2020-05-19 NOTE — ED Notes (Signed)
Pt given meal tray.

## 2020-05-20 LAB — CBC WITH DIFFERENTIAL/PLATELET
Abs Immature Granulocytes: 0.18 10*3/uL — ABNORMAL HIGH (ref 0.00–0.07)
Basophils Absolute: 0.2 10*3/uL — ABNORMAL HIGH (ref 0.0–0.1)
Basophils Relative: 1 %
Eosinophils Absolute: 0.5 10*3/uL (ref 0.0–0.5)
Eosinophils Relative: 4 %
HCT: 34.3 % — ABNORMAL LOW (ref 39.0–52.0)
Hemoglobin: 11.5 g/dL — ABNORMAL LOW (ref 13.0–17.0)
Immature Granulocytes: 1 %
Lymphocytes Relative: 6 %
Lymphs Abs: 0.8 10*3/uL (ref 0.7–4.0)
MCH: 32.5 pg (ref 26.0–34.0)
MCHC: 33.5 g/dL (ref 30.0–36.0)
MCV: 96.9 fL (ref 80.0–100.0)
Monocytes Absolute: 1.7 10*3/uL — ABNORMAL HIGH (ref 0.1–1.0)
Monocytes Relative: 13 %
Neutro Abs: 9.3 10*3/uL — ABNORMAL HIGH (ref 1.7–7.7)
Neutrophils Relative %: 75 %
Platelets: 306 10*3/uL (ref 150–400)
RBC: 3.54 MIL/uL — ABNORMAL LOW (ref 4.22–5.81)
RDW: 16.2 % — ABNORMAL HIGH (ref 11.5–15.5)
WBC: 12.6 10*3/uL — ABNORMAL HIGH (ref 4.0–10.5)
nRBC: 0 % (ref 0.0–0.2)

## 2020-05-20 LAB — BASIC METABOLIC PANEL
Anion gap: 8 (ref 5–15)
BUN: 16 mg/dL (ref 6–20)
CO2: 25 mmol/L (ref 22–32)
Calcium: 8.3 mg/dL — ABNORMAL LOW (ref 8.9–10.3)
Chloride: 103 mmol/L (ref 98–111)
Creatinine, Ser: 1 mg/dL (ref 0.61–1.24)
GFR, Estimated: >60 mL/min (ref >60)
Glucose, Bld: 93 mg/dL (ref 70–99)
Potassium: 4 mmol/L (ref 3.5–5.1)
Sodium: 136 mmol/L (ref 135–145)

## 2020-05-20 LAB — GLUCOSE, CAPILLARY
Glucose-Capillary: 93 mg/dL (ref 70–99)
Glucose-Capillary: 155 mg/dL — ABNORMAL HIGH (ref 70–99)

## 2020-05-20 LAB — MAGNESIUM: Magnesium: 2.3 mg/dL (ref 1.7–2.4)

## 2020-05-20 LAB — HEMOGLOBIN A1C
Hgb A1c MFr Bld: 4.8 % (ref 4.8–5.6)
Mean Plasma Glucose: 91 mg/dL

## 2020-05-20 MED ORDER — AZITHROMYCIN 500 MG PO TABS: 500.0000 mg | ORAL_TABLET | Freq: Every day | ORAL | 0 refills | Status: AC

## 2020-05-20 MED ORDER — SODIUM CHLORIDE 0.9 % IV SOLN
500.0000 mg | INTRAVENOUS | Status: DC
  Administered 2020-05-20: 500 mg via INTRAVENOUS
  Filled 2020-05-20: qty 500

## 2020-05-20 MED ORDER — SODIUM CHLORIDE 0.9 % IV SOLN: 2.0000 g | INTRAVENOUS | Status: DC

## 2020-05-20 MED ORDER — CEPHALEXIN 500 MG PO CAPS: 500.0000 mg | ORAL_CAPSULE | Freq: Four times a day (QID) | ORAL | 0 refills | Status: AC

## 2020-05-20 NOTE — TOC Initial Note (Signed)
Transition of Care [facility]) - Initial/Assessment Note    Patient Details  Name: Brian [name] MRN: [id number] Date of Birth: April 04, 1961  Transition of Care [facility]) CM/SW Contact:    Lia Hopping, LCSW Phone Number: 05/20/2020, 1:16 PM  Clinical Narrative:     Patient admitted for shortness of Breath. Patient has past medical history of metastatic initally stage 3 non small cell lung cancer with concern for disease progression.         Saturation screen complete. Patient requires 3L of Home O2.  Referral made to [place]. [facility] staff and the nurse will educate the patient how to use the O2 portable tanks.   Patient fairly independent with ADL's. Spouse is his caregiver.  No other needs identified. CSW will sign off.   Expected Discharge Plan: Home/Self Care Barriers to Discharge: Barriers Resolved   Patient Goals and CMS Choice     Choice offered to / list presented to : NA  Expected Discharge Plan and Services Expected Discharge Plan: Home/Self Care   Discharge Planning Services: CM Consult Post Acute Care Choice: [place] arrangements for the past 2 months: Single Family Home Expected Discharge Date: 05/20/20               DME Arranged: Oxygen DME Agency: [facility] Date DME Agency Contacted: 05/20/20 Time DME Agency Contacted: [phone] Representative spoke with at DME Agency: Andree Coss            Prior Living Arrangements/Services Living arrangements for the past 2 months: [place] with:: Spouse Patient language and need for interpreter reviewed:: No Do you feel safe going back to the place where you live?: Yes      Need for Family Participation in Patient Care: Yes (Comment) Care giver support system in place?: Yes (comment) Current home services: DME Criminal Activity/Legal Involvement Pertinent to Current Situation/Hospitalization: No - Comment as needed  Activities of Daily Living Home Assistive  Devices/Equipment: None ADL Screening (condition at time of admission) Patient's cognitive ability adequate to safely complete daily activities?: Yes Is the patient deaf or have difficulty hearing?: No Does the patient have difficulty seeing, even when wearing glasses/contacts?: No Does the patient have difficulty concentrating, remembering, or making decisions?: No Patient able to express need for assistance with ADLs?: Yes Does the patient have difficulty dressing or bathing?: No Independently performs ADLs?: Yes (appropriate for developmental age) Does the patient have difficulty walking or climbing stairs?: No Weakness of Legs: Both Weakness of Arms/Hands: Both  Permission Sought/Granted Permission sought to share information with : Other (comment) Permission granted to share information with : Yes, Verbal Permission Granted     Permission granted to share info w AGENCY: [facility]        Emotional Assessment Appearance:: Appears stated age Attitude/Demeanor/Rapport: Engaged Affect (typically observed): Appropriate Orientation: : Oriented to Self, Oriented to Place, Oriented to  Time, Oriented to Situation Alcohol / Substance Use: Not Applicable Psych Involvement: No (comment)  Admission diagnosis:  Acute respiratory failure with hypoxia ([place]) [J96.01] Acute hypoxemic respiratory failure ([place]) [J96.01] Community acquired pneumonia of right lower lobe of lung [J18.9] Patient Active Problem List   Diagnosis Date Noted  . Acute hypoxemic respiratory failure ([place]) 05/19/2020  . Acute respiratory failure with hypoxia ([place]) 05/18/2020  . Essential hypertension 05/18/2020  . Type 2 diabetes mellitus with hyperlipidemia ([place]) 05/18/2020  . S/P thoracentesis 03/21/2020  . Brain metastases ([place]) 03/05/2020  . Calf swelling 02/21/2020  . Rash 02/07/2020  .  Adenocarcinoma of right lung, stage 4 ([place]) 02/07/2020  . Chronic cough 12/20/2019  . Venous thrombosis 09/21/2019  .  Shortness of breath 09/07/2019  . Headache 08/09/2019  . Lobar pneumonia, unspecified organism ([place]) 03/07/2019  . Centrilobular emphysema ([place]) 03/07/2019  . Encounter for antineoplastic immunotherapy 11/10/2018  . Adenocarcinoma of right lung, stage 3 ([place]) 08/25/2018  . Goals of care, counseling/discussion 08/25/2018  . Encounter for antineoplastic chemotherapy 08/25/2018  . Mass of upper lobe of right lung 08/04/2018  . Subarachnoid hemorrhage ([place]) 05/22/2015   PCP:  [name], MD Pharmacy:   [facility], [place] [address] Phone: [phone] Fax: [phone]  [facility] [place] [facility] [address] [place] [address] Phone: [phone] Fax: [phone]  [facility] [place] [place] [facility] [place] [address] [place] [address] Phone: [phone] Fax: [phone]     Social Determinants of Health (SDOH) Interventions    Readmission Risk Interventions No flowsheet data found.

## 2020-05-20 NOTE — Progress Notes (Signed)
SATURATION QUALIFICATIONS: (This note is used to comply with regulatory documentation for home oxygen)  Patient Saturations on Room Air at Rest = 93%  Patient Saturations on Room Air while Ambulating = 65%  Patient Saturations on 3 Liters of oxygen while Ambulating = 92%  Please briefly explain why patient needs home oxygen:

## 2020-05-20 NOTE — Progress Notes (Signed)
D/C instructions given to patient. Patient had no questions. NT or writer will wheel patient out once he is dressed

## 2020-05-20 NOTE — Discharge Summary (Signed)
Physician Discharge Summary  Brian [name] OEU:[id number] DOB: 14-Aug-1960 DOA: 05/18/2020  PCP: [name], MD  Admit date: 05/18/2020 Discharge date: 05/20/2020  Admitted From: Home Disposition: Home  Recommendations for Outpatient Follow-up:  1. Follow up with PCP in 1-2 weeks 2.   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 In the emergency room, he was 86% on room air, chest x-ray with no changes but changes consistent with his known malignancy.  Suspected left middle lobe pneumonia.  With his symptomatology he was admitted to the hospital.  Community-acquired pneumonia with history of underlying lung cancer, acute hypoxemic respiratory failure with underlying history of COPD and structural lung disease: Treated with antibiotics with Rocephin and azithromycin for 3 days with good clinical response.  Cultures negative so far.  Symptomatically improved. Ambulated on room air with oxygen saturations less than 88%.  His underlying chronic lung issues are probably responsible for hypoxia. He will benefit with home oxygen on mobility that he will be prescribed. He has clinically improved so we will continue 2 more days of azithromycin to complete total 5 days of therapy and 4 more days of Keflex to complete total 7 days of therapy for pneumonia.  He will follow up with his oncologist for further management. Echocardiogram was with normal ejection fraction.  Has other multiple medical issues, he  will be able to resume all his long-term medications.  Discharge Diagnoses:  Principal Problem:   Acute respiratory failure with hypoxia ([place]) Active Problems:   Centrilobular emphysema (HCC)   Venous thrombosis   Adenocarcinoma of right lung, stage 4 (HCC)   Essential hypertension   Type 2 diabetes mellitus with hyperlipidemia ([place])   Acute hypoxemic respiratory failure [facility])    Discharge Instructions  Discharge Instructions    Call MD for:  difficulty breathing, headache or visual disturbances   Complete by: As directed    Diet Carb Modified   Complete by: As directed    Increase activity slowly   Complete by: As directed      Allergies as of 05/20/2020   No Known Allergies     Medication List    TAKE these medications   albuterol 108 (90 Base) MCG/ACT inhaler Commonly known as: VENTOLIN HFA Inhale 2 puffs into the lungs every 4 (four) hours as needed for wheezing or shortness of breath.   [name] 150 MG capsule Generic drug: alectinib Take 4 capsules (600 mg total) by mouth 2 (two) times daily with a meal.   amLODipine 5 MG tablet Commonly known as: NORVASC Take 5 mg by mouth daily.   atorvastatin 20 MG tablet Commonly known as: LIPITOR Take 20 mg by mouth daily.   azithromycin 500 MG tablet Commonly known as: Zithromax Take 1 tablet (500 mg total) by mouth daily for 2 days. Take 1 tablet daily for 2 days Start taking on: May 21, 2020   B COMPLEX PO Take 1 tablet by mouth daily.   B-12 PO Take 1 tablet by mouth daily.   benzonatate 200 MG capsule Commonly known as: TESSALON TAKE ONE CAP 2-3 TIMES A DAY FOR COUGH What  changed: See the new instructions.   cephALEXin 500 MG capsule Commonly known as: KEFLEX Take 1 capsule (500 mg total) by mouth 4 (four) times daily for 4 days.   cholecalciferol 25 MCG (1000 UNIT) tablet Commonly known as: VITAMIN D3 Take 1,000 Units by mouth daily.   enoxaparin 120 MG/0.8ML injection Commonly known as:  LOVENOX Inject 0.8 mLs (120 mg total) into the skin daily. What changed: when to take this   Flutter Devi Please use up 10 times daily(4-5 breaths, 4-5 times daily)   folic acid 1 MG tablet Commonly known as: FOLVITE Take 1 tablet (1 mg total) by mouth daily.   levothyroxine 100 MCG tablet Commonly known as: Synthroid Take 1 tablet (100 mcg total) by mouth daily before breakfast.   metFORMIN 500 MG tablet Commonly known as: GLUCOPHAGE Take 500 mg by mouth daily with breakfast.   multivitamin tablet Take 1 tablet by mouth daily.   prochlorperazine 10 MG tablet Commonly known as: COMPAZINE Take 1 tablet (10 mg total) by mouth every 6 (six) hours as needed.   Trelegy Ellipta 100-62.5-25 MCG/INH Aepb Generic drug: Fluticasone-Umeclidin-Vilant Inhale 1 puff into the lungs daily.   venlafaxine 37.5 MG tablet Commonly known as: EFFEXOR Take 37.5 mg by mouth daily.            Durable Medical Equipment  (From admission, onward)         Start     Ordered   05/20/20 1241  For home use only DME oxygen  Once       Comments: Patient Saturations on Room Air at Rest = 93%  Patient Saturations on Room Air while Ambulating = 65%  Patient Saturations on 3 Liters of oxygen while Ambulating = 92%  Question Answer Comment  Length of Need Lifetime   Mode or (Route) Nasal cannula   Liters per Minute 3   Frequency Continuous (stationary and portable oxygen unit needed)   Oxygen conserving device Yes   Oxygen delivery system Gas      05/20/20 1240          No Known Allergies  Consultations:  None   Procedures/Studies: DG Chest 1 View  Result Date: 05/13/2020 CLINICAL DATA:  Status post RIGHT thoracentesis EXAM: CHEST  1 VIEW COMPARISON:  CT February 22, 2020, chest x-ray 03/21/2020 and chest ultrasound for thoracentesis on 05/13/2020 FINDINGS: Cardiomediastinal contours and hilar structures are stable compared to the March 21, 2020 evaluation. RIGHT upper lobe  findings not as well seen as on the recent CT. No sign of pleural effusion following thoracentesis. No pneumothorax. LEFT chest is clear. Bandlike opacity extends to the periphery of the RIGHT chest as before. Limited assessment of skeletal structures without acute process. IMPRESSION: 1. No pneumothorax or sign of pleural effusion following thoracentesis. 2. RIGHT upper lobe findings not as well seen as on the recent CT. Electronically Signed   By: Zetta Bills M.D.   On: 05/13/2020 11:17   DG Chest 2 View  Result Date: 05/18/2020 CLINICAL DATA:  Shortness of breath for 2 weeks with a productive cough. Increased use of rescue inhaler. History of small cell lung cancer. EXAM: CHEST - 2 VIEW COMPARISON:  May 13, 2020 FINDINGS: No pneumothorax. Bandlike opacity remains in the right mid lung, unchanged in the interval. The heart, hila, mediastinum are unchanged. The left lung is clear. No new focal infiltrates on the right. Mild increased opacity in the medial right base, slightly more pronounced in the interval. No other acute  abnormalities or changes. IMPRESSION: 1. Bandlike opacity in the right mid lung is unchanged consistent with atelectasis/collapse, noted be secondary to the patient's known malignancy on the May 03, 2020 CT scan. 2. Opacity in the medial right lung base is mildly more pronounced in the interval. This could represent increasing atelectasis or developing infiltrate. Recommend attention on follow-up. 3. No other interval changes. Electronically Signed   By: Dorise Bullion III M.D   On: 05/18/2020 09:41   CT Chest W Contrast  Result Date: 05/03/2020 CLINICAL DATA:  Non-small cell lung cancer staging. History of prior radiotherapy to the neck and chest, also post chemo and immunotherapy. EXAM: CT CHEST, ABDOMEN, AND PELVIS WITH CONTRAST TECHNIQUE: Multidetector CT imaging of the chest, abdomen and pelvis was performed following the standard protocol during bolus administration of  intravenous contrast. CONTRAST:  148mL OMNIPAQUE IOHEXOL 300 MG/ML  SOLN COMPARISON:  CT angiography of the chest from July of 2021, prior PET scans from July and CT scans from June of 2021. FINDINGS: CT CHEST FINDINGS Cardiovascular: Normal caliber thoracic aorta. Normal heart size without pericardial effusion. Central pulmonary vessels are unremarkable on venous phase assessment aside from post treatment changes about the RIGHT hilum Mediastinum/Nodes: Vascular structures are normal. Esophagus is patulous mildly thickened. No axillary lymphadenopathy. No mediastinal lymphadenopathy. No hilar adenopathy. Soft tissue and RIGHT hilum as on previous imaging. No discrete lymph node in this location. Lungs/Pleura: Since the CT of June of 2021 there has been development of an effusion which was present on the study of July of 2021. This effusion is slightly increased compared to the July study. This measures approximately 5 cm greatest depth on today's exam previously approximately 3.9 cm. Consolidative changes and post treatment effect about the RIGHT hilum with similar appearance. Nodular focus in the RIGHT upper lobe, discrete nodule on the prior study has nearly completely resolved only ground-glass attenuation remaining in the the RIGHT upper lobe at the site of previous ill-defined nodules. Septal thickening in this area is subjectively improved. Septal thickening in aerated RIGHT lower lobe also with subjective improvement. RIGHT lower lobe nodules seen on previous imaging now with only ground-glass attenuation. At the site of the largest area in the RIGHT lower lobe there is consolidative change in the lung and ground-glass without measurable nodule. There is however a new small solid nodule at the LEFT lung base measuring 7 mm (image 127, series 4) Above findings also with dramatic improvement with respect to septal thickening as compared to the study of February 02, 2020 and with respect to discrete nodules with  solid characteristics that were seen on previous imaging. Musculoskeletal: No discrete chest wall mass. See below for full musculoskeletal detail. CT ABDOMEN PELVIS FINDINGS Hepatobiliary: No focal, suspicious hepatic lesion. No pericholecystic stranding. No biliary duct dilation. Pancreas: Pancreas without ductal dilation or inflammation. Spleen: Spleen normal in size and contour. Adrenals/Urinary Tract: Adrenal glands are normal. Symmetric renal enhancement. No hydronephrosis. Urinary bladder is normal. No suspicious renal lesion. Stable low-density lesion in the upper pole LEFT kidney compatible with cysts. Stomach/Bowel: Small hiatal hernia. Mild esophageal thickening throughout and patulous esophagus as described previously. No acute small bowel process. No bowel obstruction. Appendix is normal. Stool throughout much of the colon. Vascular/Lymphatic: Calcified and noncalcified plaque, mild in the abdominal aorta. No aneurysmal dilation. IVC filter in place, tip just at or below level of renal venous confluence. There is no gastrohepatic or hepatoduodenal ligament lymphadenopathy. No retroperitoneal or mesenteric lymphadenopathy. No pelvic sidewall lymphadenopathy. Reproductive:  Prostate unremarkable by CT. Other: Large fat containing RIGHT inguinal hernia with incipient herniation of small bowel loops in the RIGHT lower quadrant. Moderate fat containing LEFT inguinal hernia. Musculoskeletal: No acute musculoskeletal process. No destructive bone finding. IMPRESSION: 1. Similar appearance of background post treatment changes in the RIGHT chest with marked interval improvement of irregular septal thickening and resolution of solid nodules now with only faint ground-glass scattered throughout the RIGHT chest. Compatible with interval response to therapy. 2. Increased RIGHT pleural effusion is however noted on today's study. No nodular enhancing component, attention on follow-up. 3. New 7 mm solid nodule at the LEFT  lung base. This is suspicious for new site of disease. 4. Decreased conspicuity of small lymph nodes which were FDG avid in the RIGHT axilla on the previous study also with diminished size of nodal tissue near the LEFT diaphragmatic crus. 5. No evidence of metastatic disease to the abdomen or pelvis. 6. Large fat containing RIGHT inguinal hernia with incipient herniation of small bowel loops in the RIGHT lower quadrant. No acute small bowel process. 7. Aortic atherosclerosis. Aortic Atherosclerosis (ICD10-I70.0). Electronically Signed   By: Zetta Bills M.D.   On: 05/03/2020 17:04   MR Brain W Wo Contrast  Result Date: 05/02/2020 CLINICAL DATA:  Brain/CNS neoplasm, surveillance. EXAM: MRI HEAD WITHOUT AND WITH CONTRAST TECHNIQUE: Multiplanar, multiecho pulse sequences of the brain and surrounding structures were obtained without and with intravenous contrast. CONTRAST:  56mL MULTIHANCE GADOBENATE DIMEGLUMINE 529 MG/ML IV SOLN COMPARISON:  None. FINDINGS: Brain: No acute infarction, hemorrhage, hydrocephalus or extra-axial collection. Few scattered foci of T2 hyperintensity are seen within the white matter of the cerebral hemispheres and within the pons, nonspecific, most likely related to early microangiopathic changes. Previously described foci of contrast enhancement are no longer appreciated. No new focus of abnormal contrast enhancement identified. Vascular: Normal flow voids. Skull and upper cervical spine: Normal marrow signal. Sinuses/Orbits: Negative. Other: None. IMPRESSION: 1. No evidence of intracranial metastatic disease. 2. Mild chronic microangiopathic changes. Electronically Signed   By: Pedro Earls M.D.   On: 05/02/2020 15:22   CT Abdomen Pelvis W Contrast  Result Date: 05/03/2020 CLINICAL DATA:  Non-small cell lung cancer staging. History of prior radiotherapy to the neck and chest, also post chemo and immunotherapy. EXAM: CT CHEST, ABDOMEN, AND PELVIS WITH CONTRAST  TECHNIQUE: Multidetector CT imaging of the chest, abdomen and pelvis was performed following the standard protocol during bolus administration of intravenous contrast. CONTRAST:  179mL OMNIPAQUE IOHEXOL 300 MG/ML  SOLN COMPARISON:  CT angiography of the chest from July of 2021, prior PET scans from July and CT scans from June of 2021. FINDINGS: CT CHEST FINDINGS Cardiovascular: Normal caliber thoracic aorta. Normal heart size without pericardial effusion. Central pulmonary vessels are unremarkable on venous phase assessment aside from post treatment changes about the RIGHT hilum Mediastinum/Nodes: Vascular structures are normal. Esophagus is patulous mildly thickened. No axillary lymphadenopathy. No mediastinal lymphadenopathy. No hilar adenopathy. Soft tissue and RIGHT hilum as on previous imaging. No discrete lymph node in this location. Lungs/Pleura: Since the CT of June of 2021 there has been development of an effusion which was present on the study of July of 2021. This effusion is slightly increased compared to the July study. This measures approximately 5 cm greatest depth on today's exam previously approximately 3.9 cm. Consolidative changes and post treatment effect about the RIGHT hilum with similar appearance. Nodular focus in the RIGHT upper lobe, discrete nodule on the prior study has  nearly completely resolved only ground-glass attenuation remaining in the the RIGHT upper lobe at the site of previous ill-defined nodules. Septal thickening in this area is subjectively improved. Septal thickening in aerated RIGHT lower lobe also with subjective improvement. RIGHT lower lobe nodules seen on previous imaging now with only ground-glass attenuation. At the site of the largest area in the RIGHT lower lobe there is consolidative change in the lung and ground-glass without measurable nodule. There is however a new small solid nodule at the LEFT lung base measuring 7 mm (image 127, series 4) Above findings also  with dramatic improvement with respect to septal thickening as compared to the study of February 02, 2020 and with respect to discrete nodules with solid characteristics that were seen on previous imaging. Musculoskeletal: No discrete chest wall mass. See below for full musculoskeletal detail. CT ABDOMEN PELVIS FINDINGS Hepatobiliary: No focal, suspicious hepatic lesion. No pericholecystic stranding. No biliary duct dilation. Pancreas: Pancreas without ductal dilation or inflammation. Spleen: Spleen normal in size and contour. Adrenals/Urinary Tract: Adrenal glands are normal. Symmetric renal enhancement. No hydronephrosis. Urinary bladder is normal. No suspicious renal lesion. Stable low-density lesion in the upper pole LEFT kidney compatible with cysts. Stomach/Bowel: Small hiatal hernia. Mild esophageal thickening throughout and patulous esophagus as described previously. No acute small bowel process. No bowel obstruction. Appendix is normal. Stool throughout much of the colon. Vascular/Lymphatic: Calcified and noncalcified plaque, mild in the abdominal aorta. No aneurysmal dilation. IVC filter in place, tip just at or below level of renal venous confluence. There is no gastrohepatic or hepatoduodenal ligament lymphadenopathy. No retroperitoneal or mesenteric lymphadenopathy. No pelvic sidewall lymphadenopathy. Reproductive: Prostate unremarkable by CT. Other: Large fat containing RIGHT inguinal hernia with incipient herniation of small bowel loops in the RIGHT lower quadrant. Moderate fat containing LEFT inguinal hernia. Musculoskeletal: No acute musculoskeletal process. No destructive bone finding. IMPRESSION: 1. Similar appearance of background post treatment changes in the RIGHT chest with marked interval improvement of irregular septal thickening and resolution of solid nodules now with only faint ground-glass scattered throughout the RIGHT chest. Compatible with interval response to therapy. 2. Increased RIGHT  pleural effusion is however noted on today's study. No nodular enhancing component, attention on follow-up. 3. New 7 mm solid nodule at the LEFT lung base. This is suspicious for new site of disease. 4. Decreased conspicuity of small lymph nodes which were FDG avid in the RIGHT axilla on the previous study also with diminished size of nodal tissue near the LEFT diaphragmatic crus. 5. No evidence of metastatic disease to the abdomen or pelvis. 6. Large fat containing RIGHT inguinal hernia with incipient herniation of small bowel loops in the RIGHT lower quadrant. No acute small bowel process. 7. Aortic atherosclerosis. Aortic Atherosclerosis (ICD10-I70.0). Electronically Signed   By: Zetta Bills M.D.   On: 05/03/2020 17:04   ECHOCARDIOGRAM COMPLETE  Result Date: 05/18/2020    ECHOCARDIOGRAM REPORT   Patient Name:   CAMRON MONDAY Date of Exam: 05/18/2020 Medical Rec #:  [id number]      Height:       68.0 in Accession #:    [id number]     Weight:       177.2 lb Date of Birth:  01-16-1961     BSA:          1.941 m Patient Age:    84 years       BP:           103/70 mmHg Patient Gender: M  HR:           86 bpm. Exam Location:  Inpatient Procedure: 2D Echo, Cardiac Doppler and Color Doppler Indications:    Dyspnea 786.09 / R06.00  History:        Patient has no prior history of Echocardiogram examinations.                 Risk Factors:Hypertension and Diabetes.  Sonographer:    Jonelle Sidle Dance Referring Phys: [id number] [place]  1. Left ventricular ejection fraction, by estimation, is 55 to 60%. The left ventricle has normal function. The left ventricle has no regional wall motion abnormalities. Left ventricular diastolic parameters were normal.  2. Right ventricular systolic function is normal. The right ventricular size is normal. Tricuspid regurgitation signal is inadequate for assessing PA pressure.  3. The mitral valve is normal in structure. Trivial mitral valve regurgitation. No  evidence of mitral stenosis.  4. The aortic valve is tricuspid. There is mild calcification of the aortic valve. Aortic valve regurgitation is not visualized. No aortic stenosis is present.  5. The inferior vena cava is normal in size with greater than 50% respiratory variability, suggesting right atrial pressure of 3 mmHg. FINDINGS  Left Ventricle: Left ventricular ejection fraction, by estimation, is 55 to 60%. The left ventricle has normal function. The left ventricle has no regional wall motion abnormalities. The left ventricular internal cavity size was normal in size. There is  no left ventricular hypertrophy. Left ventricular diastolic parameters were normal. Right Ventricle: The right ventricular size is normal. No increase in right ventricular wall thickness. Right ventricular systolic function is normal. Tricuspid regurgitation signal is inadequate for assessing PA pressure. Left Atrium: Left atrial size was normal in size. Right Atrium: Right atrial size was normal in size. Pericardium: There is no evidence of pericardial effusion. Mitral Valve: The mitral valve is normal in structure. Trivial mitral valve regurgitation. No evidence of mitral valve stenosis. Tricuspid Valve: The tricuspid valve is normal in structure. Tricuspid valve regurgitation is trivial. No evidence of tricuspid stenosis. Aortic Valve: The aortic valve is tricuspid. There is mild calcification of the aortic valve. Aortic valve regurgitation is not visualized. No aortic stenosis is present. Pulmonic Valve: The pulmonic valve was normal in structure. Pulmonic valve regurgitation is trivial. No evidence of pulmonic stenosis. Aorta: The aortic root is normal in size and structure. Venous: The inferior vena cava is normal in size with greater than 50% respiratory variability, suggesting right atrial pressure of 3 mmHg. IAS/Shunts: No atrial level shunt detected by color flow Doppler.  LEFT VENTRICLE PLAX 2D LVIDd:         4.40 cm   Diastology LVIDs:         2.60 cm  LV e' medial:    6.85 cm/s LV PW:         0.90 cm  LV E/e' medial:  10.2 LV IVS:        0.80 cm  LV e' lateral:   10.10 cm/s LVOT diam:     2.10 cm  LV E/e' lateral: 6.9 LV SV:         63 LV SV Index:   32 LVOT Area:     3.46 cm  RIGHT VENTRICLE             IVC RV Basal diam:  2.80 cm     IVC diam: 1.80 cm RV S prime:     13.40 cm/s TAPSE (M-mode): 1.7 cm LEFT ATRIUM  Index       RIGHT ATRIUM           Index LA diam:        3.60 cm 1.85 cm/m  RA Area:     10.60 cm LA Vol (A2C):   39.8 ml 20.50 ml/m RA Volume:   21.50 ml  11.08 ml/m LA Vol (A4C):   22.3 ml 11.49 ml/m LA Biplane Vol: 30.3 ml 15.61 ml/m  AORTIC VALVE LVOT Vmax:   107.00 cm/s LVOT Vmean:  64.500 cm/s LVOT VTI:    0.181 m  AORTA Ao Root diam: 3.10 cm Ao Asc diam:  2.80 cm MITRAL VALVE MV Area (PHT): 2.76 cm    SHUNTS MV Decel Time: 275 msec    Systemic VTI:  0.18 m MV E velocity: 69.70 cm/s  Systemic Diam: 2.10 cm MV A velocity: 67.70 cm/s MV E/A ratio:  1.03 Cherlynn Kaiser MD Electronically signed by Cherlynn Kaiser MD Signature Date/Time: 05/18/2020/4:01:13 PM    Final    US Thoracentesis Asp Pleural space w/IMG guide  Result Date: 05/13/2020 INDICATION: Patient with history of stage IV right lung adenocarcinoma, dyspnea, recurrent right pleural effusion. Request received for therapeutic right thoracentesis. EXAM: ULTRASOUND GUIDED THERAPEUTIC RIGHT THORACENTESIS MEDICATIONS: 1% lidocaine to skin and subcutaneous tissue COMPLICATIONS: None immediate. PROCEDURE: An ultrasound guided thoracentesis was thoroughly discussed with the patient and questions answered. The benefits, risks, alternatives and complications were also discussed. The patient understands and wishes to proceed with the procedure. Written consent was obtained. Ultrasound was performed to localize and mark an adequate pocket of fluid in the right chest. The area was then prepped and draped in the normal sterile fashion. 1%  Lidocaine was used for local anesthesia. Under ultrasound guidance a 6 Fr Safe-T-Centesis catheter was introduced. Thoracentesis was performed. The catheter was removed and a dressing applied. FINDINGS: A total of approximately 900 cc of amber fluid was removed. IMPRESSION: Successful ultrasound guided therapeutic right thoracentesis yielding 900 cc of pleural fluid. Read by: Rowe Tae, PA-C Electronically Signed   By: Sandi Mariscal M.D.   On: 05/13/2020 11:09    (Echo, Carotid, EGD, Colonoscopy, ERCP)    Subjective: Patient was seen and examined.  No overnight events.  Still has some cough and sputum production, however he feels more comfortable.  He walked in the hallway with his wife.  He wants to go home and recover at home and feels comfortable.   Discharge Exam: Vitals:   05/20/20 0024 05/20/20 0503  BP: 103/73 106/67  Pulse: 91 85  Resp: 19 17  Temp: 98.2 F (36.8 C) 97.8 F (36.6 C)  SpO2: 91% 97%   Vitals:   05/19/20 2032 05/20/20 0024 05/20/20 0500 05/20/20 0503  BP: 113/73 103/73  106/67  Pulse: 92 91  85  Resp: (!) 21 19  17   Temp: 98.2 F (36.8 C) 98.2 F (36.8 C)  97.8 F (36.6 C)  TempSrc:      SpO2: 95% 91%  97%  Weight:   76.9 kg   Height:        General: Pt is alert, awake, not in acute distress Cardiovascular: RRR, S1/S2 +, no rubs, no gallops Respiratory: CTA bilaterally, no wheezing, no rhonchi Has some upper airway sounds.  He is on 2 L oxygen and looks comfortable. Abdominal: Soft, NT, ND, bowel sounds + Extremities: no edema, no cyanosis    The results of significant diagnostics from this hospitalization (including imaging, microbiology, ancillary and laboratory) are listed below  for reference.     Microbiology: Recent Results (from the past 240 hour(s))  Respiratory Panel by RT PCR (Flu A&B, Covid) - Nasopharyngeal Swab     Status: None   Collection Time: 05/18/20  8:23 AM   Specimen: Nasopharyngeal Swab  Result Value Ref Range Status    SARS Coronavirus 2 by RT PCR NEGATIVE NEGATIVE Final    Comment: (NOTE) SARS-CoV-2 target nucleic acids are NOT DETECTED.  The SARS-CoV-2 RNA is generally detectable in upper respiratoy specimens during the acute phase of infection. The lowest concentration of SARS-CoV-2 viral copies this assay can detect is 131 copies/mL. A negative result does not preclude SARS-Cov-2 infection and should not be used as the sole basis for treatment or other patient management decisions. A negative result may occur with  improper specimen collection/handling, submission of specimen other than nasopharyngeal swab, presence of viral mutation(s) within the areas targeted by this assay, and inadequate number of viral copies (<131 copies/mL). A negative result must be combined with clinical observations, patient history, and epidemiological information. The expected result is Negative.  Fact Sheet for Patients:  PinkCheek.be  Fact Sheet for Healthcare Providers:  GravelBags.it  This test is no t yet approved or cleared by the [place] FDA and  has been authorized for detection and/or diagnosis of SARS-CoV-2 by FDA under an Emergency Use Authorization (EUA). This EUA will remain  in effect (meaning this test can be used) for the duration of the COVID-19 declaration under Section 564(b)(1) of the Act, 21 U.S.C. section 360bbb-3(b)(1), unless the authorization is terminated or revoked sooner.     Influenza A by PCR NEGATIVE NEGATIVE Final   Influenza B by PCR NEGATIVE NEGATIVE Final    Comment: (NOTE) The Xpert Xpress SARS-CoV-2/FLU/RSV assay is intended as an aid in  the diagnosis of influenza from Nasopharyngeal swab specimens and  should not be used as a sole basis for treatment. Nasal washings and  aspirates are unacceptable for Xpert Xpress SARS-CoV-2/FLU/RSV  testing.  Fact Sheet for  Patients: PinkCheek.be  Fact Sheet for Healthcare Providers: GravelBags.it  This test is not yet approved or cleared by the [place] FDA and  has been authorized for detection and/or diagnosis of SARS-CoV-2 by  FDA under an Emergency Use Authorization (EUA). This EUA will remain  in effect (meaning this test can be used) for the duration of the  Covid-19 declaration under Section 564(b)(1) of the Act, 21  U.S.C. section 360bbb-3(b)(1), unless the authorization is  terminated or revoked. Performed at [facility], [place] [address] [place], [place] [address]      Labs: BNP (last 3 results) Recent Labs    11/26/19 1319  BNP 60.4   Basic Metabolic Panel: Recent Labs  Lab 05/18/20 0833 05/19/20 0429 05/20/20 0454  NA 136 136 136  K 3.6 3.9 4.0  CL 106 103 103  CO2 21* 23 25  GLUCOSE 156* 93 93  BUN 16 17 16   CREATININE 0.93 0.93 1.00  CALCIUM 8.6* 8.7* 8.3*  MG  --   --  2.3   Liver Function Tests: Recent Labs  Lab 05/18/20 0833  AST 36  ALT 31  ALKPHOS 179*  BILITOT 0.8  PROT 5.9*  ALBUMIN 3.2*   No results for input(s): LIPASE, AMYLASE in the last 168 hours. No results for input(s): AMMONIA in the last 168 hours. CBC: Recent Labs  Lab 05/18/20 0833 05/19/20 0429 05/20/20 0454  WBC 12.9* 12.4* 12.6*  NEUTROABS 10.2*  --  9.3*  HGB 12.1* 12.0* 11.5*  HCT 36.6* 36.2* 34.3*  MCV 97.1 96.8 96.9  PLT 322 303 306   Cardiac Enzymes: No results for input(s): CKTOTAL, CKMB, CKMBINDEX, TROPONINI in the last 168 hours. BNP: Invalid input(s): POCBNP CBG: Recent Labs  Lab 05/19/20 1205 05/19/20 1632 05/19/20 2131 05/20/20 0750 05/20/20 1152  GLUCAP 166* 88 110* 93 155*   D-Dimer No results for input(s): DDIMER in the last 72 hours. Hgb A1c Recent Labs    05/19/20 0429  HGBA1C 4.8   Lipid Profile No results for input(s): CHOL, HDL, LDLCALC, TRIG, CHOLHDL, LDLDIRECT  in the last 72 hours. Thyroid function studies No results for input(s): TSH, T4TOTAL, T3FREE, THYROIDAB in the last 72 hours.  Invalid input(s): FREET3 Anemia work up No results for input(s): VITAMINB12, FOLATE, FERRITIN, TIBC, IRON, RETICCTPCT in the last 72 hours. Urinalysis    Component Value Date/Time   COLORURINE YELLOW 10/06/2015 1942   APPEARANCEUR CLEAR 10/06/2015 1942   LABSPEC 1.035 (H) 10/06/2015 1942   PHURINE 5.0 10/06/2015 1942   GLUCOSEU NEGATIVE 10/06/2015 1942   HGBUR NEGATIVE 10/06/2015 1942   BILIRUBINUR SMALL (A) 10/06/2015 1942   KETONESUR 40 (A) 10/06/2015 1942   PROTEINUR NEGATIVE 10/06/2015 1942   NITRITE NEGATIVE 10/06/2015 1942   LEUKOCYTESUR NEGATIVE 10/06/2015 1942   Sepsis Labs Invalid input(s): PROCALCITONIN,  WBC,  LACTICIDVEN Microbiology Recent Results (from the past 240 hour(s))  Respiratory Panel by RT PCR (Flu A&B, Covid) - Nasopharyngeal Swab     Status: None   Collection Time: 05/18/20  8:23 AM   Specimen: Nasopharyngeal Swab  Result Value Ref Range Status   SARS Coronavirus 2 by RT PCR NEGATIVE NEGATIVE Final    Comment: (NOTE) SARS-CoV-2 target nucleic acids are NOT DETECTED.  The SARS-CoV-2 RNA is generally detectable in upper respiratoy specimens during the acute phase of infection. The lowest concentration of SARS-CoV-2 viral copies this assay can detect is 131 copies/mL. A negative result does not preclude SARS-Cov-2 infection and should not be used as the sole basis for treatment or other patient management decisions. A negative result may occur with  improper specimen collection/handling, submission of specimen other than nasopharyngeal swab, presence of viral mutation(s) within the areas targeted by this assay, and inadequate number of viral copies (<131 copies/mL). A negative result must be combined with clinical observations, patient history, and epidemiological information. The expected result is Negative.  Fact Sheet  for Patients:  PinkCheek.be  Fact Sheet for Healthcare Providers:  GravelBags.it  This test is no t yet approved or cleared by the [place] FDA and  has been authorized for detection and/or diagnosis of SARS-CoV-2 by FDA under an Emergency Use Authorization (EUA). This EUA will remain  in effect (meaning this test can be used) for the duration of the COVID-19 declaration under Section 564(b)(1) of the Act, 21 U.S.C. section 360bbb-3(b)(1), unless the authorization is terminated or revoked sooner.     Influenza A by PCR NEGATIVE NEGATIVE Final   Influenza B by PCR NEGATIVE NEGATIVE Final    Comment: (NOTE) The Xpert Xpress SARS-CoV-2/FLU/RSV assay is intended as an aid in  the diagnosis of influenza from Nasopharyngeal swab specimens and  should not be used as a sole basis for treatment. Nasal washings and  aspirates are unacceptable for Xpert Xpress SARS-CoV-2/FLU/RSV  testing.  Fact Sheet for Patients: PinkCheek.be  Fact Sheet for Healthcare Providers: GravelBags.it  This test is not yet approved or cleared by the [place] FDA and  has  been authorized for detection and/or diagnosis of SARS-CoV-2 by  FDA under an Emergency Use Authorization (EUA). This EUA will remain  in effect (meaning this test can be used) for the duration of the  Covid-19 declaration under Section 564(b)(1) of the Act, 21  U.S.C. section 360bbb-3(b)(1), unless the authorization is  terminated or revoked. Performed at [facility], [place] [address] [place], [place] [address]      Time coordinating discharge: 32 minutes  SIGNED:   Barb Merino, MD  [facility] 05/20/2020, 1:03 PM

## 2020-05-28 ENCOUNTER — Other Ambulatory Visit: Payer: Self-pay | Admitting: Physician Assistant

## 2020-05-28 ENCOUNTER — Other Ambulatory Visit: Payer: Self-pay | Admitting: Internal Medicine

## 2020-05-28 ENCOUNTER — Encounter: Payer: Self-pay | Admitting: Internal Medicine

## 2020-05-28 DIAGNOSIS — I8290 Acute embolism and thrombosis of unspecified vein: Secondary | ICD-10-CM

## 2020-06-05 ENCOUNTER — Other Ambulatory Visit: Payer: Self-pay

## 2020-06-05 ENCOUNTER — Encounter: Payer: Self-pay | Admitting: Internal Medicine

## 2020-06-05 ENCOUNTER — Inpatient Hospital Stay: Payer: BC Managed Care – PPO | Attending: Internal Medicine | Admitting: Internal Medicine

## 2020-06-05 ENCOUNTER — Inpatient Hospital Stay: Payer: BC Managed Care – PPO

## 2020-06-05 ENCOUNTER — Other Ambulatory Visit: Payer: Self-pay | Admitting: Physician Assistant

## 2020-06-05 ENCOUNTER — Other Ambulatory Visit: Payer: Self-pay | Admitting: Medical Oncology

## 2020-06-05 VITALS — BP 114/82 | HR 88 | Temp 97.4°F | Resp 18 | Ht 68.0 in | Wt 168.3 lb

## 2020-06-05 DIAGNOSIS — Z7901 Long term (current) use of anticoagulants: Secondary | ICD-10-CM | POA: Insufficient documentation

## 2020-06-05 DIAGNOSIS — C3491 Malignant neoplasm of unspecified part of right bronchus or lung: Secondary | ICD-10-CM | POA: Diagnosis not present

## 2020-06-05 DIAGNOSIS — J45909 Unspecified asthma, uncomplicated: Secondary | ICD-10-CM | POA: Insufficient documentation

## 2020-06-05 DIAGNOSIS — I10 Essential (primary) hypertension: Secondary | ICD-10-CM | POA: Diagnosis not present

## 2020-06-05 DIAGNOSIS — Z5111 Encounter for antineoplastic chemotherapy: Secondary | ICD-10-CM | POA: Diagnosis not present

## 2020-06-05 DIAGNOSIS — E119 Type 2 diabetes mellitus without complications: Secondary | ICD-10-CM | POA: Diagnosis not present

## 2020-06-05 DIAGNOSIS — Z923 Personal history of irradiation: Secondary | ICD-10-CM | POA: Diagnosis not present

## 2020-06-05 DIAGNOSIS — Z7984 Long term (current) use of oral hypoglycemic drugs: Secondary | ICD-10-CM | POA: Insufficient documentation

## 2020-06-05 DIAGNOSIS — C7931 Secondary malignant neoplasm of brain: Secondary | ICD-10-CM | POA: Diagnosis not present

## 2020-06-05 DIAGNOSIS — C770 Secondary and unspecified malignant neoplasm of lymph nodes of head, face and neck: Secondary | ICD-10-CM | POA: Insufficient documentation

## 2020-06-05 DIAGNOSIS — Z79899 Other long term (current) drug therapy: Secondary | ICD-10-CM | POA: Diagnosis not present

## 2020-06-05 DIAGNOSIS — C3411 Malignant neoplasm of upper lobe, right bronchus or lung: Secondary | ICD-10-CM | POA: Insufficient documentation

## 2020-06-05 DIAGNOSIS — Z9221 Personal history of antineoplastic chemotherapy: Secondary | ICD-10-CM | POA: Insufficient documentation

## 2020-06-05 DIAGNOSIS — E039 Hypothyroidism, unspecified: Secondary | ICD-10-CM

## 2020-06-05 LAB — CMP (CANCER CENTER ONLY)
ALT: 26 U/L (ref 0–44)
AST: 28 U/L (ref 15–41)
Albumin: 3.1 g/dL — ABNORMAL LOW (ref 3.5–5.0)
Alkaline Phosphatase: 210 U/L — ABNORMAL HIGH (ref 38–126)
Anion gap: 7 (ref 5–15)
BUN: 15 mg/dL (ref 6–20)
CO2: 25 mmol/L (ref 22–32)
Calcium: 8.7 mg/dL — ABNORMAL LOW (ref 8.9–10.3)
Chloride: 107 mmol/L (ref 98–111)
Creatinine: 0.93 mg/dL (ref 0.61–1.24)
GFR, Estimated: >60 mL/min (ref >60)
Glucose, Bld: 153 mg/dL — ABNORMAL HIGH (ref 70–99)
Potassium: 4 mmol/L (ref 3.5–5.1)
Sodium: 139 mmol/L (ref 135–145)
Total Bilirubin: 0.7 mg/dL (ref 0.3–1.2)
Total Protein: 6.1 g/dL — ABNORMAL LOW (ref 6.5–8.1)

## 2020-06-05 LAB — CBC WITH DIFFERENTIAL (CANCER CENTER ONLY)
Abs Immature Granulocytes: 0.18 10*3/uL — ABNORMAL HIGH (ref 0.00–0.07)
Basophils Absolute: 0.2 10*3/uL — ABNORMAL HIGH (ref 0.0–0.1)
Basophils Relative: 2 %
Eosinophils Absolute: 0.4 10*3/uL (ref 0.0–0.5)
Eosinophils Relative: 3 %
HCT: 38.7 % — ABNORMAL LOW (ref 39.0–52.0)
Hemoglobin: 12.9 g/dL — ABNORMAL LOW (ref 13.0–17.0)
Immature Granulocytes: 2 %
Lymphocytes Relative: 6 %
Lymphs Abs: 0.7 10*3/uL (ref 0.7–4.0)
MCH: 31.9 pg (ref 26.0–34.0)
MCHC: 33.3 g/dL (ref 30.0–36.0)
MCV: 95.6 fL (ref 80.0–100.0)
Monocytes Absolute: 1.3 10*3/uL — ABNORMAL HIGH (ref 0.1–1.0)
Monocytes Relative: 10 %
Neutro Abs: 9.3 10*3/uL — ABNORMAL HIGH (ref 1.7–7.7)
Neutrophils Relative %: 77 %
Platelet Count: 384 10*3/uL (ref 150–400)
RBC: 4.05 MIL/uL — ABNORMAL LOW (ref 4.22–5.81)
RDW: 16.6 % — ABNORMAL HIGH (ref 11.5–15.5)
WBC Count: 12 10*3/uL — ABNORMAL HIGH (ref 4.0–10.5)
nRBC: 0 % (ref 0.0–0.2)

## 2020-06-05 LAB — TSH: TSH: 6.513 u[IU]/mL — ABNORMAL HIGH (ref 0.320–4.118)

## 2020-06-05 NOTE — Progress Notes (Signed)
SATURATION QUALIFICATIONS: (This note is used to comply with regulatory documentation for home oxygen)  Patient Saturations on Room Air at Rest = 90%  Patient Saturations on Room Air while Ambulating =81  Patient Saturations on 2 Liters of oxygen while Ambulating = 97%  Please briefly explain why patient needs home oxygen: No other means can increase oxygen.

## 2020-06-05 NOTE — Progress Notes (Signed)
[place] [phone]   [phone]) [phone]  OFFICE PROGRESS NOTE  Brian Broker, MD [place] [id number]  DIAGNOSIS:  Metastatic non-small cell lung cancer initially diagnosed as stage IIIB (T1c, N3, M0)non-small cell lung cancer, adenocarcinoma presented with right upper lobe lung mass in addition to mediastinal and bilateral supraclavicular lymphadenopathy diagnosed in January 2020.  He had evidence of disease recurrence in June 2021 with adenopathy in the subcarinal, level 3 left neck lymph node, and lymph node at the curious of the diaphragm and in the upper abdomen.  PD-L1: 10%  Guardant 360 molecular studiesshowed no actionable mutation  [employer] Testing:  Biomarker Findings Microsatellite status - MS-Stable Tumor Mutational Burden - 4 Muts/Mb Genomic Findings For a complete list of the genes assayed, please refer to the Appendix. ALK EML4-ALK fusion (Variant 2) SMARCB1 R377C CTNNB1 S45P CDKN2A/B CDKN2B loss, CDKN2A loss CXCR4 L798* XQ11 splice site 941D>E 7 Disease relevant genes with no reportable alterations: BRAF, EGFR, ERBB2, KRAS, MET, RET, ROS1  PRIOR THERAPY: 1)Concurrent chemoradiation with weekly carboplatin for AUC of 2 and paclitaxel 45 mg/M2.Status post 6 cycles. Last dose was given on October 10, 2018 with stable disease. 2) Radiation treatment to the enlarging right cervical lymph nodes under the care of Dr. Sondra Come. First treatment 03/06/2019. Last treatment scheduledon9/05/2019 3)Consolidation treatment with immunotherapy with Imfinzi 10 mg/KG every 2 weeks. First dose November 17, 2018. Status post26cycles. 4) Systemic chemotherapy with carboplatin for an AUC of 5, Alimta 500 mg/m2, and Keytruda 200 mg IV every 3 weeks. First dose expected on 02/14/20. Status post 1 cycle.  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 5) SBRT to the left lower lobe lung nodule.  CURRENT THERAPY:  [name] ([name]) 600 mg p.o. twice daily.  He started the first dose on March 08, 2020.   Status post 2 months of treatment.  INTERVAL HISTORY: Brian [name] 59 y.o. male returns to the clinic today for follow-up visit.  The patient is feeling fine today with no concerning complaints except for shortness of breath with exertion.  He use oxygen at nighttime.  He is requesting a prescription for a portable tank.  He denied having any chest pain has mild cough with no hemoptysis.  He also has some squeezing in his stomach but no significant nausea, vomiting, diarrhea or constipation.  He has no headache or visual changes.  He denied having any weight loss or night sweats.  He continues to tolerate his treatment with [name] fairly well.  The patient is here today for evaluation and repeat blood work.  MEDICAL HISTORY: Past Medical History:  Diagnosis Date  . Asthma   . Diabetes mellitus without complication ([place])   . Hypertension   . nscl ca dx'd 08/2018  . Subarachnoid hemorrhage ([facility])     ALLERGIES:  has No Known Allergies.  MEDICATIONS:  Current Outpatient Medications  Medication Sig Dispense Refill  . albuterol (VENTOLIN HFA) 108 (90 Base) MCG/ACT inhaler Inhale 2 puffs into the lungs every 4 (four) hours as needed for wheezing or shortness of breath. 6.7 g 5  . alectinib ([name]) 150 MG capsule Take 4 capsules (600 mg total) by mouth 2 (two) times daily with a meal. 720 capsule 0  . amLODipine (NORVASC) 5 MG tablet Take 5 mg by mouth daily.     Marland Kitchen atorvastatin (LIPITOR) 20  MG tablet Take 20 mg by mouth daily.     . B Complex Vitamins (B COMPLEX PO) Take 1 tablet by mouth daily.    . benzonatate (TESSALON) 200 MG capsule TAKE ONE CAP 2-3 TIMES A DAY FOR COUGH (Patient taking differently: Take 200 mg by mouth 3 (three) times daily as needed for cough. ) 60 capsule 2  . cholecalciferol (VITAMIN  D3) 25 MCG (1000 UNIT) tablet Take 1,000 Units by mouth daily.    . Cyanocobalamin (B-12 PO) Take 1 tablet by mouth daily.    Marland Kitchen enoxaparin (LOVENOX) 120 MG/0.8ML injection INJECT 0.8 MLS (120 MG TOTAL) INTO THE SKIN DAILY. 24 mL 2  . folic acid (FOLVITE) 1 MG tablet Take 1 tablet (1 mg total) by mouth daily. (Patient not taking: Reported on 05/18/2020) 30 tablet 2  . levothyroxine (SYNTHROID) 100 MCG tablet Take 1 tablet (100 mcg total) by mouth daily before breakfast. 90 tablet 0  . metFORMIN (GLUCOPHAGE) 500 MG tablet Take 500 mg by mouth daily with breakfast.     . Multiple Vitamin (MULTIVITAMIN) tablet Take 1 tablet by mouth daily.    . prochlorperazine (COMPAZINE) 10 MG tablet Take 1 tablet (10 mg total) by mouth every 6 (six) hours as needed. 30 tablet 2  . Respiratory Therapy Supplies (FLUTTER) DEVI Please use up 10 times daily(4-5 breaths, 4-5 times daily) 1 each 0  . TRELEGY ELLIPTA 100-62.5-25 MCG/INH AEPB Inhale 1 puff into the lungs daily.    Marland Kitchen venlafaxine (EFFEXOR) 37.5 MG tablet Take 37.5 mg by mouth daily.      No current facility-administered medications for this visit.    SURGICAL HISTORY:  Past Surgical History:  Procedure Laterality Date  . IR IVC FILTER PLMT / S&I /IMG GUID/MOD SED  03/04/2020  . LEG SURGERY  age 36    left leg, from fracture    REVIEW OF SYSTEMS:  A comprehensive review of systems was negative except for: Constitutional: positive for fatigue Respiratory: positive for dyspnea on exertion   PHYSICAL EXAMINATION: General appearance: alert, cooperative and no distress Head: Normocephalic, without obvious abnormality, atraumatic Neck: no adenopathy, no JVD, supple, symmetrical, trachea midline and thyroid not enlarged, symmetric, no tenderness/mass/nodules Lymph nodes: Cervical, supraclavicular, and axillary nodes normal. Resp: clear to auscultation bilaterally Back: symmetric, no curvature. ROM normal. No CVA tenderness. Cardio: regular rate and rhythm,  S1, S2 normal, no murmur, click, rub or gallop GI: soft, non-tender; bowel sounds normal; no masses,  no organomegaly Extremities: extremities normal, atraumatic, no cyanosis or edema  ECOG PERFORMANCE STATUS: 1 - Symptomatic but completely ambulatory  Blood pressure 114/82, pulse 88, temperature (!) 97.4 F (36.3 C), temperature source Tympanic, resp. rate 18, height '5\' 8"'  (1.727 m), weight 168 lb 4.8 oz (76.3 kg), SpO2 (!) 81 %.  LABORATORY DATA: Lab Results  Component Value Date   WBC 12.0 (H) 06/05/2020   HGB 12.9 (L) 06/05/2020   HCT 38.7 (L) 06/05/2020   MCV 95.6 06/05/2020   PLT 384 06/05/2020      Chemistry      Component Value Date/Time   NA 136 05/20/2020 0454   K 4.0 05/20/2020 0454   CL 103 05/20/2020 0454   CO2 25 05/20/2020 0454   BUN 16 05/20/2020 0454   CREATININE 1.00 05/20/2020 0454   CREATININE 0.99 05/03/2020 1515      Component Value Date/Time   CALCIUM 8.3 (L) 05/20/2020 0454   ALKPHOS 179 (H) 05/18/2020 0833   AST 36 05/18/2020 6629  AST 30 05/03/2020 1515   ALT 31 05/18/2020 0833   ALT 36 05/03/2020 1515   BILITOT 0.8 05/18/2020 0833   BILITOT 0.7 05/03/2020 1515       RADIOGRAPHIC STUDIES: DG Chest 1 View  Result Date: 05/13/2020 CLINICAL DATA:  Status post RIGHT thoracentesis EXAM: CHEST  1 VIEW COMPARISON:  CT February 22, 2020, chest x-ray 03/21/2020 and chest ultrasound for thoracentesis on 05/13/2020 FINDINGS: Cardiomediastinal contours and hilar structures are stable compared to the March 21, 2020 evaluation. RIGHT upper lobe findings not as well seen as on the recent CT. No sign of pleural effusion following thoracentesis. No pneumothorax. LEFT chest is clear. Bandlike opacity extends to the periphery of the RIGHT chest as before. Limited assessment of skeletal structures without acute process. IMPRESSION: 1. No pneumothorax or sign of pleural effusion following thoracentesis. 2. RIGHT upper lobe findings not as well seen as on the recent  CT. Electronically Signed   By: Zetta Bills M.D.   On: 05/13/2020 11:17   DG Chest 2 View  Result Date: 05/18/2020 CLINICAL DATA:  Shortness of breath for 2 weeks with a productive cough. Increased use of rescue inhaler. History of small cell lung cancer. EXAM: CHEST - 2 VIEW COMPARISON:  May 13, 2020 FINDINGS: No pneumothorax. Bandlike opacity remains in the right mid lung, unchanged in the interval. The heart, hila, mediastinum are unchanged. The left lung is clear. No new focal infiltrates on the right. Mild increased opacity in the medial right base, slightly more pronounced in the interval. No other acute abnormalities or changes. IMPRESSION: 1. Bandlike opacity in the right mid lung is unchanged consistent with atelectasis/collapse, noted be secondary to the patient's known malignancy on the May 03, 2020 CT scan. 2. Opacity in the medial right lung base is mildly more pronounced in the interval. This could represent increasing atelectasis or developing infiltrate. Recommend attention on follow-up. 3. No other interval changes. Electronically Signed   By: Dorise Bullion III M.D   On: 05/18/2020 09:41   ECHOCARDIOGRAM COMPLETE  Result Date: 05/18/2020    ECHOCARDIOGRAM REPORT   Patient Name:   Brian [name] Date of Exam: 05/18/2020 Medical Rec #:  [id number]      Height:       68.0 in Accession #:    [id number]     Weight:       177.2 lb Date of Birth:  Feb 02, 1961     BSA:          1.941 m Patient Age:    59 years       BP:           103/70 mmHg Patient Gender: M              HR:           86 bpm. Exam Location:  Inpatient Procedure: 2D Echo, Cardiac Doppler and Color Doppler Indications:    Dyspnea 786.09 / R06.00  History:        Patient has no prior history of Echocardiogram examinations.                 Risk Factors:Hypertension and Diabetes.  Sonographer:    Jonelle Sidle Dance Referring Phys: [id number] [place]  1. Left ventricular ejection fraction, by estimation, is 55 to  60%. The left ventricle has normal function. The left ventricle has no regional wall motion abnormalities. Left ventricular diastolic parameters were normal.  2. Right ventricular systolic function is normal. The right  ventricular size is normal. Tricuspid regurgitation signal is inadequate for assessing PA pressure.  3. The mitral valve is normal in structure. Trivial mitral valve regurgitation. No evidence of mitral stenosis.  4. The aortic valve is tricuspid. There is mild calcification of the aortic valve. Aortic valve regurgitation is not visualized. No aortic stenosis is present.  5. The inferior vena cava is normal in size with greater than 50% respiratory variability, suggesting right atrial pressure of 3 mmHg. FINDINGS  Left Ventricle: Left ventricular ejection fraction, by estimation, is 55 to 60%. The left ventricle has normal function. The left ventricle has no regional wall motion abnormalities. The left ventricular internal cavity size was normal in size. There is  no left ventricular hypertrophy. Left ventricular diastolic parameters were normal. Right Ventricle: The right ventricular size is normal. No increase in right ventricular wall thickness. Right ventricular systolic function is normal. Tricuspid regurgitation signal is inadequate for assessing PA pressure. Left Atrium: Left atrial size was normal in size. Right Atrium: Right atrial size was normal in size. Pericardium: There is no evidence of pericardial effusion. Mitral Valve: The mitral valve is normal in structure. Trivial mitral valve regurgitation. No evidence of mitral valve stenosis. Tricuspid Valve: The tricuspid valve is normal in structure. Tricuspid valve regurgitation is trivial. No evidence of tricuspid stenosis. Aortic Valve: The aortic valve is tricuspid. There is mild calcification of the aortic valve. Aortic valve regurgitation is not visualized. No aortic stenosis is present. Pulmonic Valve: The pulmonic valve was normal in  structure. Pulmonic valve regurgitation is trivial. No evidence of pulmonic stenosis. Aorta: The aortic root is normal in size and structure. Venous: The inferior vena cava is normal in size with greater than 50% respiratory variability, suggesting right atrial pressure of 3 mmHg. IAS/Shunts: No atrial level shunt detected by color flow Doppler.  LEFT VENTRICLE PLAX 2D LVIDd:         4.40 cm  Diastology LVIDs:         2.60 cm  LV e' medial:    6.85 cm/s LV PW:         0.90 cm  LV E/e' medial:  10.2 LV IVS:        0.80 cm  LV e' lateral:   10.10 cm/s LVOT diam:     2.10 cm  LV E/e' lateral: 6.9 LV SV:         63 LV SV Index:   32 LVOT Area:     3.46 cm  RIGHT VENTRICLE             IVC RV Basal diam:  2.80 cm     IVC diam: 1.80 cm RV S prime:     13.40 cm/s TAPSE (M-mode): 1.7 cm LEFT ATRIUM             Index       RIGHT ATRIUM           Index LA diam:        3.60 cm 1.85 cm/m  RA Area:     10.60 cm LA Vol (A2C):   39.8 ml 20.50 ml/m RA Volume:   21.50 ml  11.08 ml/m LA Vol (A4C):   22.3 ml 11.49 ml/m LA Biplane Vol: 30.3 ml 15.61 ml/m  AORTIC VALVE LVOT Vmax:   107.00 cm/s LVOT Vmean:  64.500 cm/s LVOT VTI:    0.181 m  AORTA Ao Root diam: 3.10 cm Ao Asc diam:  2.80 cm MITRAL VALVE MV Area (PHT): 2.76  cm    SHUNTS MV Decel Time: 275 msec    Systemic VTI:  0.18 m MV E velocity: 69.70 cm/s  Systemic Diam: 2.10 cm MV A velocity: 67.70 cm/s MV E/A ratio:  1.03 Cherlynn Kaiser MD Electronically signed by Cherlynn Kaiser MD Signature Date/Time: 05/18/2020/4:01:13 PM    Final    US Thoracentesis Asp Pleural space w/IMG guide  Result Date: 05/13/2020 INDICATION: Patient with history of stage IV right lung adenocarcinoma, dyspnea, recurrent right pleural effusion. Request received for therapeutic right thoracentesis. EXAM: ULTRASOUND GUIDED THERAPEUTIC RIGHT THORACENTESIS MEDICATIONS: 1% lidocaine to skin and subcutaneous tissue COMPLICATIONS: None immediate. PROCEDURE: An ultrasound guided thoracentesis was  thoroughly discussed with the patient and questions answered. The benefits, risks, alternatives and complications were also discussed. The patient understands and wishes to proceed with the procedure. Written consent was obtained. Ultrasound was performed to localize and mark an adequate pocket of fluid in the right chest. The area was then prepped and draped in the normal sterile fashion. 1% Lidocaine was used for local anesthesia. Under ultrasound guidance a 6 Fr Safe-T-Centesis catheter was introduced. Thoracentesis was performed. The catheter was removed and a dressing applied. FINDINGS: A total of approximately 900 cc of amber fluid was removed. IMPRESSION: Successful ultrasound guided therapeutic right thoracentesis yielding 900 cc of pleural fluid. Read by: Rowe Quintel, PA-C Electronically Signed   By: Sandi Mariscal M.D.   On: 05/13/2020 11:09    ASSESSMENT AND PLAN: This is a very pleasant 59 years old white male with metastatic non-small cell lung cancer, adenocarcinoma now with ALK gene translocation.  The patient was initially diagnosed as a stage IIIa non-small cell lung cancer status post a course of concurrent chemoradiation with weekly carboplatin and paclitaxel followed by 1 year of consolidation treatment with immunotherapy with Imfinzi. He had evidence for disease progression recently. Repeat tissue biopsy from the left supraclavicular lymph nodes and molecular studies showed that the patient has positive ALK gene translocation.  His previous molecular studies by [employer] was negative. I had a lengthy discussion with the patient today about his condition and treatment options. He received 1 cycle of systemic chemotherapy with carboplatin, Alimta and Keytruda.  We will discontinue his systemic chemotherapy for now because of the new findings on the molecular studies. The patient is currently on treatment with [name] 600 mg p.o. twice daily.  He is status post 3 months of treatment and  has been tolerating this treatment fairly well except for occasional queezing in his stomach. He also underwent SBRT to the enlarging nodule in the left lung. I recommended for the patient to continue his current treatment with [name] with the same dose. I will see him back for follow-up visit in 1 months with repeat blood work. For the hypothyroidism, I increased his dose of levothyroxine to 100 mcg p.o. daily. He was advised to call immediately if he has any concerning symptoms in the interval. The patient voices understanding of current disease status and treatment options and is in agreement with the current care plan.  All questions were answered. The patient knows to call the clinic with any problems, questions or concerns. We can certainly see the patient much sooner if necessary.  Disclaimer: This note was dictated with voice recognition software. Similar sounding words can inadvertently be transcribed and may not be corrected upon review.

## 2020-06-07 DIAGNOSIS — E782 Mixed hyperlipidemia: Secondary | ICD-10-CM | POA: Diagnosis not present

## 2020-06-07 DIAGNOSIS — J189 Pneumonia, unspecified organism: Secondary | ICD-10-CM | POA: Diagnosis not present

## 2020-06-07 DIAGNOSIS — J439 Emphysema, unspecified: Secondary | ICD-10-CM | POA: Diagnosis not present

## 2020-06-10 ENCOUNTER — Telehealth: Payer: Self-pay | Admitting: Internal Medicine

## 2020-06-10 NOTE — Telephone Encounter (Signed)
Scheduled per los. Called and spoke with patient. Confirmed appt 

## 2020-06-12 ENCOUNTER — Encounter: Payer: Self-pay | Admitting: Internal Medicine

## 2020-06-17 DIAGNOSIS — Z01818 Encounter for other preprocedural examination: Secondary | ICD-10-CM | POA: Diagnosis not present

## 2020-06-20 DIAGNOSIS — J9601 Acute respiratory failure with hypoxia: Secondary | ICD-10-CM | POA: Diagnosis not present

## 2020-06-20 DIAGNOSIS — I10 Essential (primary) hypertension: Secondary | ICD-10-CM | POA: Diagnosis not present

## 2020-06-20 NOTE — Progress Notes (Signed)
Pt. Needs orders for upcomming surgery.PST and labs. appointment on 06/21/20.Thanks.

## 2020-06-20 NOTE — Patient Instructions (Signed)
DUE TO COVID-19 ONLY ONE VISITOR IS ALLOWED TO COME WITH YOU AND STAY IN THE WAITING ROOM ONLY DURING PRE OP AND PROCEDURE DAY OF SURGERY. THE 1 VISITOR  MAY VISIT WITH YOU AFTER SURGERY IN YOUR PRIVATE ROOM DURING VISITING HOURS ONLY!  YOU NEED TO HAVE A COVID 19 TEST ON: 06/21/20, THIS TEST MUST BE DONE BEFORE SURGERY,  COVID TESTING SITE [address] [place] [place] [address], IT IS ON THE RIGHT GOING OUT [address] APPROXIMATELY  2 MINUTES PAST [employer] ON THE RIGHT. ONCE YOUR COVID TEST IS COMPLETED,  PLEASE BEGIN THE QUARANTINE INSTRUCTIONS AS OUTLINED IN YOUR HANDOUT.                Brian [name]    Your procedure is scheduled on:    Report to [facility] Main  Entrance   Report to admitting at: 12:15 PM     Call this number if you have problems the morning of surgery [phone]    Remember: Do not eat solid food :After Midnight. Clear liquids until: 11:15 am  CLEAR LIQUID DIET   Foods Allowed                                                                     Foods Excluded  Coffee and tea, regular and decaf                             liquids that you cannot  Plain Jell-O any favor except red or purple                                           see through such as: Fruit ices (not with fruit pulp)                                     milk, soups, orange juice  Iced Popsicles                                    All solid food Carbonated beverages, regular and diet                                    Cranberry, grape and apple juices Sports drinks like Gatorade Lightly seasoned clear broth or consume(fat free) Sugar, honey syrup  Sample Menu Breakfast                                Lunch                                     Supper Cranberry juice                    Beef  broth                            Chicken broth Jell-O                                     Grape juice                           Apple juice Coffee or tea                         Jell-O                                      Popsicle                                                Coffee or tea                        Coffee or tea  _____________________________________________________________________  BRUSH YOUR TEETH MORNING OF SURGERY AND RINSE YOUR MOUTH OUT, NO CHEWING GUM CANDY OR MINTS.    Take these medicines the morning of surgery with A SIP OF WATER: synthroid,amlodipine.  How to Manage Your Diabetes Before and After Surgery  Why is it important to control my blood sugar before and after surgery? . Improving blood sugar levels before and after surgery helps healing and can limit problems. . A way of improving blood sugar control is eating a healthy diet by: o  Eating less sugar and carbohydrates o  Increasing activity/exercise o  Talking with your doctor about reaching your blood sugar goals . High blood sugars (greater than 180 mg/dL) can raise your risk of infections and slow your recovery, so you will need to focus on controlling your diabetes during the weeks before surgery. . Make sure that the doctor who takes care of your diabetes knows about your planned surgery including the date and location.  How do I manage my blood sugar before surgery? . Check your blood sugar at least 4 times a day, starting 2 days before surgery, to make sure that the level is not too high or low. o Check your blood sugar the morning of your surgery when you wake up and every 2 hours until you get to the Short Stay unit. . If your blood sugar is less than 70 mg/dL, you will need to treat for low blood sugar: o Do not take insulin. o Treat a low blood sugar (less than 70 mg/dL) with  cup of clear juice (cranberry or apple), 4 glucose tablets, OR glucose gel. o Recheck blood sugar in 15 minutes after treatment (to make sure it is greater than 70 mg/dL). If your blood sugar is not greater than 70 mg/dL on recheck, call [phone] for further instructions. . Report your blood  sugar to the short stay nurse when you get to Short Stay.  . If you are admitted to the hospital after surgery: o Your blood sugar will be checked by the staff and you will probably be given insulin after  surgery (instead of oral diabetes medicines) to make sure you have good blood sugar levels. o The goal for blood sugar control after surgery is 80-180 mg/dL.   WHAT DO I DO ABOUT MY DIABETES MEDICATION?  Marland Kitchen Do not take oral diabetes medicines (pills) the morning of surgery.  . THE DAY BEFORE SURGERY, take Metformin as usual.       . THE MORNING OF SURGERY:.  DO NOT TAKE ANY DIABETIC MEDICATIONS DAY OF YOUR SURGERY                               You may not have any metal on your body including hair pins and              piercings  Do not wear jewelry, lotions, powders or perfumes, deodorant             Men may shave face and neck.   Do not bring valuables to the hospital. [place].  Contacts, dentures or bridgework may not be worn into surgery.  Leave suitcase in the car. After surgery it may be brought to your room.     Patients discharged the day of surgery will not be allowed to drive home. IF YOU ARE HAVING SURGERY AND GOING HOME THE SAME DAY, YOU MUST HAVE AN ADULT TO DRIVE YOU HOME AND BE WITH YOU FOR 24 HOURS. YOU MAY GO HOME BY TAXI OR UBER OR ORTHERWISE, BUT AN ADULT MUST ACCOMPANY YOU HOME AND STAY WITH YOU FOR 24 HOURS.  Name and phone number of your driver:  Special Instructions: N/A              Please read over the following fact sheets you were given: _____________________________________________________________________         [facility] - Preparing for Surgery Before surgery, you can play an important role.  Because skin is not sterile, your skin needs to be as free of germs as possible.  You can reduce the number of germs on your skin by washing with CHG (chlorahexidine gluconate) soap before surgery.  CHG is an  antiseptic cleaner which kills germs and bonds with the skin to continue killing germs even after washing. Please DO NOT use if you have an allergy to CHG or antibacterial soaps.  If your skin becomes reddened/irritated stop using the CHG and inform your nurse when you arrive at Short Stay. Do not shave (including legs and underarms) for at least 48 hours prior to the first CHG shower.  You may shave your face/neck. Please follow these instructions carefully:  1.  Shower with CHG Soap the night before surgery and the  morning of Surgery.  2.  If you choose to wash your hair, wash your hair first as usual with your  normal  shampoo.  3.  After you shampoo, rinse your hair and body thoroughly to remove the  shampoo.                           4.  Use CHG as you would any other liquid soap.  You can apply chg directly  to the skin and wash                       Gently with  a scrungie or clean washcloth.  5.  Apply the CHG Soap to your body ONLY FROM THE NECK DOWN.   Do not use on face/ open                           Wound or open sores. Avoid contact with eyes, ears mouth and genitals (private parts).                       Wash face,  Genitals (private parts) with your normal soap.             6.  Wash thoroughly, paying special attention to the area where your surgery  will be performed.  7.  Thoroughly rinse your body with warm water from the neck down.  8.  DO NOT shower/wash with your normal soap after using and rinsing off  the CHG Soap.                9.  Pat yourself dry with a clean towel.            10.  Wear clean pajamas.            11.  Place clean sheets on your bed the night of your first shower and do not  sleep with pets. Day of Surgery : Do not apply any lotions/deodorants the morning of surgery.  Please wear clean clothes to the hospital/surgery center.  FAILURE TO FOLLOW THESE INSTRUCTIONS MAY RESULT IN THE CANCELLATION OF YOUR SURGERY PATIENT  SIGNATURE_________________________________  NURSE SIGNATURE__________________________________  ________________________________________________________________________

## 2020-06-21 ENCOUNTER — Other Ambulatory Visit: Payer: Self-pay

## 2020-06-21 ENCOUNTER — Encounter (HOSPITAL_COMMUNITY): Payer: Self-pay

## 2020-06-21 ENCOUNTER — Other Ambulatory Visit (HOSPITAL_COMMUNITY)
Admission: RE | Admit: 2020-06-21 | Discharge: 2020-06-21 | Disposition: A | Discharge status: Home / Self Care | Payer: BC Managed Care – PPO | Source: Ambulatory Visit | Attending: General Surgery | Admitting: General Surgery

## 2020-06-21 ENCOUNTER — Encounter (HOSPITAL_COMMUNITY)
Admission: RE | Admit: 2020-06-21 | Discharge: 2020-06-21 | Disposition: A | Discharge status: Home / Self Care | Payer: BC Managed Care – PPO | Source: Ambulatory Visit | Attending: General Surgery | Admitting: General Surgery

## 2020-06-21 ENCOUNTER — Ambulatory Visit: Payer: Self-pay | Admitting: General Surgery

## 2020-06-21 DIAGNOSIS — Z20822 Contact with and (suspected) exposure to covid-19: Secondary | ICD-10-CM | POA: Insufficient documentation

## 2020-06-21 DIAGNOSIS — Z7951 Long term (current) use of inhaled steroids: Secondary | ICD-10-CM | POA: Diagnosis not present

## 2020-06-21 DIAGNOSIS — K4090 Unilateral inguinal hernia, without obstruction or gangrene, not specified as recurrent: Secondary | ICD-10-CM | POA: Diagnosis not present

## 2020-06-21 DIAGNOSIS — Z01812 Encounter for preprocedural laboratory examination: Secondary | ICD-10-CM | POA: Insufficient documentation

## 2020-06-21 DIAGNOSIS — Z79890 Hormone replacement therapy (postmenopausal): Secondary | ICD-10-CM | POA: Diagnosis not present

## 2020-06-21 DIAGNOSIS — Z79899 Other long term (current) drug therapy: Secondary | ICD-10-CM | POA: Diagnosis not present

## 2020-06-21 DIAGNOSIS — Z7984 Long term (current) use of oral hypoglycemic drugs: Secondary | ICD-10-CM | POA: Diagnosis not present

## 2020-06-21 DIAGNOSIS — Z7901 Long term (current) use of anticoagulants: Secondary | ICD-10-CM | POA: Diagnosis not present

## 2020-06-21 HISTORY — DX: Chronic obstructive pulmonary disease, unspecified: J44.9

## 2020-06-21 HISTORY — DX: Pneumonia, unspecified organism: J18.9

## 2020-06-21 LAB — CBC
HCT: 40 % (ref 39.0–52.0)
Hemoglobin: 13.6 g/dL (ref 13.0–17.0)
MCH: 32.5 pg (ref 26.0–34.0)
MCHC: 34 g/dL (ref 30.0–36.0)
MCV: 95.7 fL (ref 80.0–100.0)
Platelets: 293 10*3/uL (ref 150–400)
RBC: 4.18 MIL/uL — ABNORMAL LOW (ref 4.22–5.81)
RDW: 16.6 % — ABNORMAL HIGH (ref 11.5–15.5)
WBC: 14.7 10*3/uL — ABNORMAL HIGH (ref 4.0–10.5)
nRBC: 0 % (ref 0.0–0.2)

## 2020-06-21 LAB — SARS CORONAVIRUS 2 (TAT 6-24 HRS): SARS Coronavirus 2: NEGATIVE

## 2020-06-21 LAB — BASIC METABOLIC PANEL
Anion gap: 9 (ref 5–15)
BUN: 18 mg/dL (ref 6–20)
CO2: 25 mmol/L (ref 22–32)
Calcium: 8.7 mg/dL — ABNORMAL LOW (ref 8.9–10.3)
Chloride: 103 mmol/L (ref 98–111)
Creatinine, Ser: 0.84 mg/dL (ref 0.61–1.24)
GFR, Estimated: >60 mL/min (ref >60)
Glucose, Bld: 93 mg/dL (ref 70–99)
Potassium: 4 mmol/L (ref 3.5–5.1)
Sodium: 137 mmol/L (ref 135–145)

## 2020-06-21 LAB — GLUCOSE, CAPILLARY: Glucose-Capillary: 93 mg/dL (ref 70–99)

## 2020-06-21 NOTE — Progress Notes (Signed)
COVID Vaccine Completed: Yes Date COVID Vaccine completed: 03/26/20. [place] COVID vaccine manufacturer: Pfizer    PCP - Dr. [name]. LOV: 06/07/20 Cardiologist - NO  Chest x-ray -  EKG - 05/18/20.  Stress Test -  ECHO -  Cardiac Cath -  Pacemaker/ICD device last checked:  Sleep Study -  CPAP -   Fasting Blood Sugar -  Checks Blood Sugar _____ times a day  Blood Thinner Instructions: Lovenox, no instructions. RN contacted [facility] triage nurse about it. Aspirin Instructions: Last Dose:  Anesthesia review: Hx: HTN.  Patient denies shortness of breath, fever, cough and chest pain at PAT appointment   Patient verbalized understanding of instructions that were given to them at the PAT appointment. Patient was also instructed that they will need to review over the PAT instructions again at home before surgery.

## 2020-06-21 NOTE — Progress Notes (Signed)
Called [facility] for orders for 06/24/20 surgery.

## 2020-06-24 ENCOUNTER — Encounter (HOSPITAL_COMMUNITY): Payer: Self-pay | Admitting: General Surgery

## 2020-06-24 ENCOUNTER — Other Ambulatory Visit: Payer: Self-pay

## 2020-06-24 ENCOUNTER — Ambulatory Visit (HOSPITAL_COMMUNITY)
Admission: RE | Admit: 2020-06-24 | Discharge: 2020-06-24 | Disposition: A | Discharge status: Home / Self Care | Payer: BC Managed Care – PPO | Attending: General Surgery | Admitting: General Surgery

## 2020-06-24 ENCOUNTER — Ambulatory Visit (HOSPITAL_COMMUNITY): Payer: BC Managed Care – PPO | Admitting: Physician Assistant

## 2020-06-24 ENCOUNTER — Encounter (HOSPITAL_COMMUNITY)
Admission: RE | Disposition: A | Discharge status: Home / Self Care | Payer: Self-pay | Source: Home / Self Care | Attending: General Surgery

## 2020-06-24 DIAGNOSIS — K4090 Unilateral inguinal hernia, without obstruction or gangrene, not specified as recurrent: Secondary | ICD-10-CM | POA: Insufficient documentation

## 2020-06-24 DIAGNOSIS — I10 Essential (primary) hypertension: Secondary | ICD-10-CM | POA: Diagnosis not present

## 2020-06-24 DIAGNOSIS — Z7984 Long term (current) use of oral hypoglycemic drugs: Secondary | ICD-10-CM | POA: Insufficient documentation

## 2020-06-24 DIAGNOSIS — G8918 Other acute postprocedural pain: Secondary | ICD-10-CM | POA: Diagnosis not present

## 2020-06-24 DIAGNOSIS — Z79899 Other long term (current) drug therapy: Secondary | ICD-10-CM | POA: Diagnosis not present

## 2020-06-24 DIAGNOSIS — Z7951 Long term (current) use of inhaled steroids: Secondary | ICD-10-CM | POA: Insufficient documentation

## 2020-06-24 DIAGNOSIS — Z20822 Contact with and (suspected) exposure to covid-19: Secondary | ICD-10-CM | POA: Diagnosis not present

## 2020-06-24 DIAGNOSIS — Z7901 Long term (current) use of anticoagulants: Secondary | ICD-10-CM | POA: Insufficient documentation

## 2020-06-24 DIAGNOSIS — Z79890 Hormone replacement therapy (postmenopausal): Secondary | ICD-10-CM | POA: Diagnosis not present

## 2020-06-24 DIAGNOSIS — E039 Hypothyroidism, unspecified: Secondary | ICD-10-CM | POA: Diagnosis not present

## 2020-06-24 HISTORY — PX: INGUINAL HERNIA REPAIR: SHX194

## 2020-06-24 LAB — GLUCOSE, CAPILLARY
Glucose-Capillary: 99 mg/dL (ref 70–99)
Glucose-Capillary: 124 mg/dL — ABNORMAL HIGH (ref 70–99)

## 2020-06-24 SURGERY — REPAIR, HERNIA, INGUINAL, ADULT
Anesthesia: General | Site: Inguinal | Laterality: Right

## 2020-06-24 MED ORDER — LACTATED RINGERS IV SOLN: INTRAVENOUS | Status: DC

## 2020-06-24 MED ORDER — LIDOCAINE HCL (CARDIAC) PF 100 MG/5ML IV SOSY
PREFILLED_SYRINGE | INTRAVENOUS | Status: DC | PRN
  Administered 2020-06-24: 50 mg via INTRAVENOUS

## 2020-06-24 MED ORDER — CEFAZOLIN SODIUM-DEXTROSE 2-4 GM/100ML-% IV SOLN
2.0000 g | INTRAVENOUS | Status: AC
  Administered 2020-06-24: 2 g via INTRAVENOUS
  Filled 2020-06-24: qty 100

## 2020-06-24 MED ORDER — CHLORHEXIDINE GLUCONATE CLOTH 2 % EX PADS: 6.0000 | MEDICATED_PAD | Freq: Once | CUTANEOUS | Status: DC

## 2020-06-24 MED ORDER — MIDAZOLAM HCL 2 MG/2ML IJ SOLN
1.0000 mg | INTRAMUSCULAR | Status: DC
  Filled 2020-06-24: qty 2

## 2020-06-24 MED ORDER — DEXAMETHASONE SODIUM PHOSPHATE 10 MG/ML IJ SOLN
INTRAMUSCULAR | Status: DC | PRN
  Administered 2020-06-24: 5 mg via INTRAVENOUS

## 2020-06-24 MED ORDER — PROPOFOL 10 MG/ML IV BOLUS
INTRAVENOUS | Status: DC | PRN
  Administered 2020-06-24: 130 mg via INTRAVENOUS

## 2020-06-24 MED ORDER — ORAL CARE MOUTH RINSE: 15.0000 mL | Freq: Once | OROMUCOSAL | Status: AC

## 2020-06-24 MED ORDER — ACETAMINOPHEN 500 MG PO TABS
1000.0000 mg | ORAL_TABLET | ORAL | Status: AC
  Administered 2020-06-24: 1000 mg via ORAL
  Filled 2020-06-24: qty 2

## 2020-06-24 MED ORDER — ACETAMINOPHEN 325 MG PO TABS: 325.0000 mg | ORAL_TABLET | Freq: Once | ORAL | Status: DC | PRN

## 2020-06-24 MED ORDER — DEXAMETHASONE SODIUM PHOSPHATE 10 MG/ML IJ SOLN
INTRAMUSCULAR | Status: AC
  Filled 2020-06-24: qty 1

## 2020-06-24 MED ORDER — FENTANYL CITRATE (PF) 100 MCG/2ML IJ SOLN: 25.0000 ug | INTRAMUSCULAR | Status: DC | PRN

## 2020-06-24 MED ORDER — ONDANSETRON HCL 4 MG/2ML IJ SOLN
INTRAMUSCULAR | Status: AC
  Filled 2020-06-24: qty 2

## 2020-06-24 MED ORDER — BUPIVACAINE HCL (PF) 0.5 % IJ SOLN
INTRAMUSCULAR | Status: AC
  Filled 2020-06-24: qty 30

## 2020-06-24 MED ORDER — ACETAMINOPHEN 10 MG/ML IV SOLN: 1000.0000 mg | Freq: Once | INTRAVENOUS | Status: DC | PRN

## 2020-06-24 MED ORDER — BUPIVACAINE HCL 0.5 % IJ SOLN
INTRAMUSCULAR | Status: DC | PRN
  Administered 2020-06-24: 20 mL

## 2020-06-24 MED ORDER — FENTANYL CITRATE (PF) 100 MCG/2ML IJ SOLN
INTRAMUSCULAR | Status: AC
  Filled 2020-06-24: qty 2

## 2020-06-24 MED ORDER — BUPIVACAINE HCL 0.5 % IJ SOLN
INTRAMUSCULAR | Status: DC | PRN
  Administered 2020-06-24: 15 mL

## 2020-06-24 MED ORDER — LIDOCAINE 2% (20 MG/ML) 5 ML SYRINGE
INTRAMUSCULAR | Status: AC
  Filled 2020-06-24: qty 10

## 2020-06-24 MED ORDER — IBUPROFEN 800 MG PO TABS: 800.0000 mg | ORAL_TABLET | Freq: Three times a day (TID) | ORAL | 0 refills | Status: DC | PRN

## 2020-06-24 MED ORDER — ACETAMINOPHEN 160 MG/5ML PO SOLN: 325.0000 mg | Freq: Once | ORAL | Status: DC | PRN

## 2020-06-24 MED ORDER — HYDROCODONE-ACETAMINOPHEN 5-325 MG PO TABS: 1.0000 | ORAL_TABLET | Freq: Four times a day (QID) | ORAL | 0 refills | Status: DC | PRN

## 2020-06-24 MED ORDER — BUPIVACAINE LIPOSOME 1.3 % IJ SUSP
INTRAMUSCULAR | Status: DC | PRN
  Administered 2020-06-24: 10 mL

## 2020-06-24 MED ORDER — AMISULPRIDE (ANTIEMETIC) 5 MG/2ML IV SOLN: 10.0000 mg | Freq: Once | INTRAVENOUS | Status: DC | PRN

## 2020-06-24 MED ORDER — ONDANSETRON HCL 4 MG/2ML IJ SOLN
INTRAMUSCULAR | Status: DC | PRN
  Administered 2020-06-24: 4 mg via INTRAVENOUS

## 2020-06-24 MED ORDER — SUGAMMADEX SODIUM 200 MG/2ML IV SOLN
INTRAVENOUS | Status: DC | PRN
  Administered 2020-06-24: 150 mg via INTRAVENOUS

## 2020-06-24 MED ORDER — ROCURONIUM BROMIDE 100 MG/10ML IV SOLN
INTRAVENOUS | Status: DC | PRN
  Administered 2020-06-24: 50 mg via INTRAVENOUS
  Administered 2020-06-24 (×2): 10 mg via INTRAVENOUS

## 2020-06-24 MED ORDER — MEPERIDINE HCL 50 MG/ML IJ SOLN: 6.2500 mg | INTRAMUSCULAR | Status: DC | PRN

## 2020-06-24 MED ORDER — CHLORHEXIDINE GLUCONATE 0.12 % MT SOLN
15.0000 mL | Freq: Once | OROMUCOSAL | Status: AC
  Administered 2020-06-24: 15 mL via OROMUCOSAL

## 2020-06-24 MED ORDER — KETOROLAC TROMETHAMINE 15 MG/ML IJ SOLN
15.0000 mg | INTRAMUSCULAR | Status: AC
  Administered 2020-06-24: 15 mg via INTRAVENOUS
  Filled 2020-06-24: qty 1

## 2020-06-24 MED ORDER — MIDAZOLAM HCL 2 MG/2ML IJ SOLN
INTRAMUSCULAR | Status: AC
  Filled 2020-06-24: qty 2

## 2020-06-24 MED ORDER — FENTANYL CITRATE (PF) 250 MCG/5ML IJ SOLN
INTRAMUSCULAR | Status: AC
  Filled 2020-06-24: qty 5

## 2020-06-24 MED ORDER — ROCURONIUM BROMIDE 10 MG/ML (PF) SYRINGE
PREFILLED_SYRINGE | INTRAVENOUS | Status: AC
  Filled 2020-06-24: qty 10

## 2020-06-24 MED ORDER — 0.9 % SODIUM CHLORIDE (POUR BTL) OPTIME
TOPICAL | Status: DC | PRN
  Administered 2020-06-24: 1000 mL

## 2020-06-24 MED ORDER — ENSURE PRE-SURGERY PO LIQD
296.0000 mL | Freq: Once | ORAL | Status: DC
  Filled 2020-06-24: qty 296

## 2020-06-24 MED ORDER — FENTANYL CITRATE (PF) 100 MCG/2ML IJ SOLN
50.0000 ug | INTRAMUSCULAR | Status: DC
  Administered 2020-06-24 (×4): 50 ug via INTRAVENOUS
  Filled 2020-06-24: qty 2

## 2020-06-24 SURGICAL SUPPLY — 45 items
BENZOIN TINCTURE PRP APPL 2/3 (GAUZE/BANDAGES/DRESSINGS) IMPLANT
BLADE SURG 15 STRL LF DISP TIS (BLADE) ×1 IMPLANT
BLADE SURG 15 STRL SS (BLADE) ×1
CELLS DAT CNTRL 66122 CELL SVR (MISCELLANEOUS) IMPLANT
CHLORAPREP W/TINT 26 (MISCELLANEOUS) ×2 IMPLANT
COVER SURGICAL LIGHT HANDLE (MISCELLANEOUS) ×2 IMPLANT
COVER WAND RF STERILE (DRAPES) ×2 IMPLANT
DECANTER SPIKE VIAL GLASS SM (MISCELLANEOUS) ×2 IMPLANT
DERMABOND ADVANCED (GAUZE/BANDAGES/DRESSINGS) ×1
DERMABOND ADVANCED .7 DNX12 (GAUZE/BANDAGES/DRESSINGS) ×1 IMPLANT
DRAIN PENROSE 0.5X18 (DRAIN) ×2 IMPLANT
DRAPE LAPAROTOMY TRNSV 102X78 (DRAPES) ×2 IMPLANT
DRSG TEGADERM 4X4.75 (GAUZE/BANDAGES/DRESSINGS) IMPLANT
DRSG TELFA PLUS 4X6 ADH ISLAND (GAUZE/BANDAGES/DRESSINGS) IMPLANT
ELECT REM PT RETURN 15FT ADLT (MISCELLANEOUS) ×2 IMPLANT
GAUZE SPONGE 4X4 12PLY STRL (GAUZE/BANDAGES/DRESSINGS) IMPLANT
GLOVE BIOGEL PI IND STRL 7.0 (GLOVE) ×1 IMPLANT
GLOVE BIOGEL PI INDICATOR 7.0 (GLOVE) ×1
GLOVE SURG SS PI 7.0 STRL IVOR (GLOVE) ×2 IMPLANT
GOWN STRL REUS W/TWL LRG LVL3 (GOWN DISPOSABLE) ×2 IMPLANT
GOWN STRL REUS W/TWL XL LVL3 (GOWN DISPOSABLE) ×4 IMPLANT
KIT BASIN OR (CUSTOM PROCEDURE TRAY) ×2 IMPLANT
KIT TURNOVER KIT A (KITS) ×2 IMPLANT
MESH HERNIA 3X6 (Mesh General) ×2 IMPLANT
NEEDLE HYPO 22GX1.5 SAFETY (NEEDLE) ×2 IMPLANT
PACK BASIC VI WITH GOWN DISP (CUSTOM PROCEDURE TRAY) ×2 IMPLANT
PENCIL SMOKE EVACUATOR (MISCELLANEOUS) ×2 IMPLANT
RETRACTOR WND ALEXIS 25 LRG (MISCELLANEOUS) IMPLANT
RTRCTR WOUND ALEXIS 18CM MED (MISCELLANEOUS)
RTRCTR WOUND ALEXIS 25CM LRG (MISCELLANEOUS)
SPONGE LAP 18X18 RF (DISPOSABLE) ×2 IMPLANT
SPONGE LAP 4X18 RFD (DISPOSABLE) ×2 IMPLANT
STRIP CLOSURE SKIN 1/2X4 (GAUZE/BANDAGES/DRESSINGS) IMPLANT
SUT MNCRL AB 4-0 PS2 18 (SUTURE) ×2 IMPLANT
SUT PDS AB 2-0 CT2 27 (SUTURE) ×2 IMPLANT
SUT PROLENE 2 0 CT2 30 (SUTURE) ×6 IMPLANT
SUT VIC AB 2-0 CT1 27 (SUTURE) ×1
SUT VIC AB 2-0 CT1 TAPERPNT 27 (SUTURE) ×1 IMPLANT
SUT VIC AB 3-0 SH 18 (SUTURE) ×2 IMPLANT
SUT VIC AB 3-0 SH 27 (SUTURE) ×2
SUT VIC AB 3-0 SH 27XBRD (SUTURE) ×2 IMPLANT
SYR BULB IRRIG 60ML STRL (SYRINGE) ×2 IMPLANT
SYR CONTROL 10ML LL (SYRINGE) ×2 IMPLANT
TOWEL OR 17X26 10 PK STRL BLUE (TOWEL DISPOSABLE) ×2 IMPLANT
TOWEL OR NON WOVEN STRL DISP B (DISPOSABLE) ×2 IMPLANT

## 2020-06-24 NOTE — Anesthesia Procedure Notes (Signed)
Procedure Name: Intubation Date/Time: 06/24/2020 1:36 PM Performed by: Claudia Desanctis, CRNA Pre-anesthesia Checklist: Patient identified, Emergency Drugs available, Suction available and Patient being monitored Patient Re-evaluated:Patient Re-evaluated prior to induction Oxygen Delivery Method: Circle system utilized Preoxygenation: Pre-oxygenation with 100% oxygen Induction Type: IV induction Ventilation: Mask ventilation without difficulty Laryngoscope Size: 2 and Miller Grade View: Grade I Tube type: Oral Tube size: 7.5 mm Number of attempts: 1 Airway Equipment and Method: Stylet Placement Confirmation: ETT inserted through vocal cords under direct vision,  positive ETCO2 and breath sounds checked- equal and bilateral Tube secured with: Tape Dental Injury: Teeth and Oropharynx as per pre-operative assessment

## 2020-06-24 NOTE — Op Note (Signed)
Preop diagnosis: right inguinal hernia  Postop diagnosis: right inguinal hernia  Procedure: open Right inguinal hernia repair with mesh  Surgeon: Gurney Maxin, M.D.  Asst: noen  Anesthesia: Gen.   Indications for procedure: Brian [name] is a 59 y.o. male with symptoms of pain and enlarging Right inguinal hernia(s). After discussing risks, alternatives and benefits he decided on open repair and was brought to day surgery for repair.  Description of procedure: The patient was brought into the operative suite, placed supine. Anesthesia was administered with endotracheal tube. Patient was strapped in place. The patient was prepped and draped in the usual sterile fashion.  The anterior superior iliac spine and pubic tubercle were identified on the Right side. An incision was made 1cm above the connecting line, representative of the location of the inguinal ligament. The subcutaneous tissue was bluntly dissected, scarpa's fascia was dissected away. The external abdominal oblique fascia was identified and sharply opened down to the external inguinal ring. The conjoint tendon and inguinal ligament were identified. The cord structures and sac were dissected free of the surrounding tissue in 360 degrees. A penrose drain was used to encircle the contents. The cremasteric fibers were dissected free of the contents of the cord and hernia sac. The cord structures (vessels and vas deferens) were identified and carefully dissected away from the hernia sac. The hernia sac contained fat and was opened and contents reduced into the peritoneal space.The hernia sac was dissected down to the internal inguinal ring. Preperitoneal fat was identified showing appropriate dissection. The sac was then reduced into the preperitoneal space. The hernia was indirect. A 3x6 Bard mesh was then used to close the defect and reinforce the floor. The mesh was sutured to the lacunar ligament and inguinal ligament using a 2-0 prolene  in running fashion. Next the superior edge of the mesh was sutured to the conjoined tendon using a 2-0 running Prolene. An additional 2-0 Prolene was used to suture the tail ends of the mesh together re-creating the deep ring. Cord structures are running in a neutral position through the mesh. Next the external abdominal oblique fascia was closed with a 2-0 Vicryl in interrupted fashion to re-create the external inguinal ring. Scarpa's fascia was closed with 3-0 Vicryl in running fashion. Skin was closed with a 4-0 Monocryl subcuticular stitch in running fashion. Dermabond place for dressing. Patient woke from anesthesia and brought to PACU in stable condition. All counts are correct.    Findings: right indirect inguinal hernia  Specimen: none  Blood loss: 10 ml  Local anesthesia: 15 ml Marcaine  Complications: none  Implant: 3 x 6 Bard mesh  Gurney Maxin, M.D. General, Bariatric, & Minimally Invasive Surgery [facility] [place] 2:49 PM 06/24/2020

## 2020-06-24 NOTE — Progress Notes (Signed)
Assisted Dr. Smith Taylor with right, ultrasound guided, transabdominal plane block. Side rails up, monitors on throughout procedure. See vital signs in flow sheet. Tolerated Procedure well.

## 2020-06-24 NOTE — H&P (Signed)
Brian [name] is an 59 y.o. male.   Chief Complaint: hernia HPI: 59 yo male with right symptomatic inguinal hernia. He presents for repair  Past Medical History:  Diagnosis Date  . COPD (chronic obstructive pulmonary disease) ([place])   . Diabetes mellitus without complication ([place])   . Hypertension   . nscl ca dx'd 08/2018  . Pneumonia   . Subarachnoid hemorrhage [facility])     Past Surgical History:  Procedure Laterality Date  . IR IVC FILTER PLMT / S&I /IMG GUID/MOD SED  03/04/2020  . LEG SURGERY  age 61    left leg, from fracture    Family History  Problem Relation Age of Onset  . Colon cancer Neg Hx   . Stomach cancer Neg Hx    Social History:  reports that he has never smoked. He has never used smokeless tobacco. He reports current alcohol use of about 5.0 standard drinks of alcohol per week. He reports that he does not use drugs.  Allergies: No Known Allergies  Medications Prior to Admission  Medication Sig Dispense Refill  . albuterol (VENTOLIN HFA) 108 (90 Base) MCG/ACT inhaler Inhale 2 puffs into the lungs every 4 (four) hours as needed for wheezing or shortness of breath. 6.7 g 5  . alectinib ([name]) 150 MG capsule Take 4 capsules (600 mg total) by mouth 2 (two) times daily with a meal. 720 capsule 0  . amLODipine (NORVASC) 5 MG tablet Take 5 mg by mouth daily.     Marland Kitchen atorvastatin (LIPITOR) 20 MG tablet Take 20 mg by mouth daily.     . B Complex Vitamins (B COMPLEX PO) Take 1 tablet by mouth daily.    . benzonatate (TESSALON) 200 MG capsule TAKE ONE CAP 2-3 TIMES A DAY FOR COUGH (Patient taking differently: TAKE ONE CAP 2-3 TIMES A DAY FOR COUGH) 60 capsule 2  . cholecalciferol (VITAMIN D3) 25 MCG (1000 UNIT) tablet Take 1,000 Units by mouth daily.    Marland Kitchen enoxaparin (LOVENOX) 120 MG/0.8ML injection INJECT 0.8 MLS (120 MG TOTAL) INTO THE SKIN DAILY. 24 mL 2  . levothyroxine (SYNTHROID) 100 MCG tablet Take 1 tablet (100 mcg total) by mouth daily before breakfast. 90 tablet 0   . metFORMIN (GLUCOPHAGE) 500 MG tablet Take 500 mg by mouth daily with breakfast.     . Multiple Vitamin (MULTIVITAMIN) tablet Take 1 tablet by mouth daily.    . prochlorperazine (COMPAZINE) 10 MG tablet Take 1 tablet (10 mg total) by mouth every 6 (six) hours as needed. (Patient taking differently: Take 10 mg by mouth every 6 (six) hours as needed for nausea or vomiting. ) 30 tablet 2  . Respiratory Therapy Supplies (FLUTTER) DEVI Please use up 10 times daily(4-5 breaths, 4-5 times daily) 1 each 0  . TRELEGY ELLIPTA 100-62.5-25 MCG/INH AEPB Inhale 1 puff into the lungs daily.    Marland Kitchen venlafaxine (EFFEXOR) 37.5 MG tablet Take 37.5 mg by mouth daily.     . folic acid (FOLVITE) 1 MG tablet Take 1 tablet (1 mg total) by mouth daily. (Patient not taking: Reported on 05/18/2020) 30 tablet 2    Results for orders placed or performed during the hospital encounter of 06/24/20 (from the past 48 hour(s))  Glucose, capillary     Status: None   Collection Time: 06/24/20 12:01 PM  Result Value Ref Range   Glucose-Capillary 99 70 - 99 mg/dL    Comment: Glucose reference range applies only to samples taken after fasting for at least  8 hours.   Comment 1 Notify RN    Comment 2 Document in Chart    No results found.  Review of Systems  Constitutional: Negative for chills and fever.  HENT: Negative for hearing loss.   Respiratory: Negative for cough.   Cardiovascular: Negative for chest pain and palpitations.  Gastrointestinal: Negative for abdominal pain, nausea and vomiting.  Genitourinary: Negative for dysuria and urgency.  Musculoskeletal: Negative for myalgias and neck pain.  Skin: Negative for rash.  Neurological: Negative for dizziness and headaches.  Hematological: Does not bruise/bleed easily.  Psychiatric/Behavioral: Negative for suicidal ideas.    Blood pressure 138/87, pulse 89, temperature 97.6 F (36.4 C), temperature source Oral, resp. rate 18, height 5\' 8"  (1.727 m), weight 74.8 kg,  SpO2 99 %. Physical Exam Vitals reviewed.  Constitutional:      Appearance: He is well-developed.  HENT:     Head: Normocephalic and atraumatic.  Eyes:     Conjunctiva/sclera: Conjunctivae normal.     Pupils: Pupils are equal, round, and reactive to light.  Cardiovascular:     Rate and Rhythm: Normal rate and regular rhythm.  Pulmonary:     Effort: Pulmonary effort is normal.     Breath sounds: Normal breath sounds.  Abdominal:     General: Bowel sounds are normal. There is no distension.     Palpations: Abdomen is soft.     Tenderness: There is no abdominal tenderness.     Comments: Right inguinal hernia  Musculoskeletal:        General: Normal range of motion.     Cervical back: Normal range of motion and neck supple.  Skin:    General: Skin is warm and dry.  Neurological:     Mental Status: He is alert and oriented to person, place, and time.  Psychiatric:        Behavior: Behavior normal.      Assessment/Plan 59 yo male with right inguinal hernia -right open inguinal hernia repair with mesh -planned outpatient procedure  Mickeal Skinner, MD 06/24/2020, 1:06 PM

## 2020-06-24 NOTE — Anesthesia Procedure Notes (Signed)
Anesthesia Regional Block: TAP block   Pre-Anesthetic Checklist: ,, timeout performed, Correct Patient, Correct Site, Correct Laterality, Correct Procedure, Correct Position, site marked, Risks and benefits discussed,  Surgical consent,  Pre-op evaluation,  At surgeon's request and post-op pain management  Laterality: Right  Prep: chloraprep       Needles:  Injection technique: Single-shot  Needle Type: Echogenic Stimulator Needle     Needle Length: 9cm  Needle Gauge: 21     Additional Needles:   Procedures:,,,, ultrasound used (permanent image in chart),,,,  Narrative:  Start time: 06/24/2020 12:35 PM End time: 06/24/2020 12:40 PM Injection made incrementally with aspirations every 5 mL.  Performed by: Personally  Anesthesiologist: Effie Berkshire, MD  Additional Notes: Patient tolerated the procedure well. Local anesthetic introduced in an incremental fashion under minimal resistance after negative aspirations. No paresthesias were elicited. After completion of the procedure, no acute issues were identified and patient continued to be monitored by RN.

## 2020-06-24 NOTE — Discharge Instructions (Signed)
[facility] _______[facility] ° °UMBILICAL OR INGUINAL HERNIA REPAIR: POST OP INSTRUCTIONS ° °Always review your discharge instruction sheet given to you by the facility where your surgery was performed. °IF YOU HAVE DISABILITY OR FAMILY LEAVE FORMS, YOU MUST BRING THEM TO THE OFFICE FOR PROCESSING.   °DO NOT GIVE THEM TO YOUR DOCTOR. ° °1. A  prescription for pain medication may be given to you upon discharge.  Take your pain medication as prescribed, if needed.  If narcotic pain medicine is not needed, then you may take acetaminophen (Tylenol) or ibuprofen (Advil) as needed. °2. Take your usually prescribed medications unless otherwise directed. °If you need a refill on your pain medication, please contact your pharmacy.  They will contact our office to request authorization. Prescriptions will not be filled after 5 pm or on week-ends. °3. You should follow a light diet the first 24 hours after arrival home, such as soup and crackers, etc.  Be sure to include lots of fluids daily.  Resume your normal diet the day after surgery. °4.Most patients will experience some swelling and bruising around the umbilicus or in the groin and scrotum.  Ice packs and reclining will help.  Swelling and bruising can take several days to resolve.  °6. It is common to experience some constipation if taking pain medication after surgery.  Increasing fluid intake and taking a stool softener (such as Colace) will usually help or prevent this problem from occurring.  A mild laxative (Milk of Magnesia or Miralax) should be taken according to package directions if there are no bowel movements after 48 hours. °7. Unless discharge instructions indicate otherwise, you may remove your bandages 24-48 hours after surgery, and you may shower at that time.  You may have steri-strips (small skin tapes) in place directly over the incision.  These strips should be left on the skin for 7-10 days.  If your surgeon used skin glue on the  incision, you may shower in 24 hours.  The glue will flake off over the next 2-3 weeks.  Any sutures or staples will be removed at the office during your follow-up visit. °8. ACTIVITIES:  You may resume regular (light) daily activities beginning the next day--such as daily self-care, walking, climbing stairs--gradually increasing activities as tolerated.  You may have sexual intercourse when it is comfortable.  Refrain from any heavy lifting or straining until approved by your doctor. ° °a.You may drive when you are no longer taking prescription pain medication, you can comfortably wear a seatbelt, and you can safely maneuver your car and apply brakes. °b.RETURN TO WORK:   °_____________________________________________ ° °9.You should see your doctor in the office for a follow-up appointment approximately 2-3 weeks after your surgery.  Make sure that you call for this appointment within a day or two after you arrive home to insure a convenient appointment time. °10.OTHER INSTRUCTIONS: _________________________ °   _____________________________________ ° °WHEN TO CALL YOUR DOCTOR: °1. Fever over 101.0 °2. Inability to urinate °3. Nausea and/or vomiting °4. Extreme swelling or bruising °5. Continued bleeding from incision. °6. Increased pain, redness, or drainage from the incision ° °The clinic staff is available to answer your questions during regular business hours.  Please don’t hesitate to call and ask to speak to one of the nurses for clinical concerns.  If you have a medical emergency, go to the nearest emergency room or call 911.  A surgeon from [facility] is always on call at the hospital ° ° °  [address], [address], [place], [place]  [address] ? ° [address] [address], [place], [place]   [address] °[phone] ? [phone] ? FAX [phone] °Web site: [email or web address] °

## 2020-06-24 NOTE — Transfer of Care (Signed)
Immediate Anesthesia Transfer of Care Note  Patient: Brian [name]  Procedure(s) Performed: RIGHT OPEN INGUINAL HERNIA REPAIR WITH MESH (Right Inguinal)  Patient Location: PACU  Anesthesia Type:General  Level of Consciousness: awake, alert , oriented and patient cooperative  Airway & Oxygen Therapy: Patient Spontanous Breathing and Patient connected to face mask  Post-op Assessment: Report given to RN and Post -op Vital signs reviewed and stable  Post vital signs: Reviewed and stable  Last Vitals:  Vitals Value Taken Time  BP 141/87 06/24/20 1458  Temp    Pulse 87 06/24/20 1459  Resp 19 06/24/20 1459  SpO2 100 % 06/24/20 1459  Vitals shown include unvalidated device data.  Last Pain:  Vitals:   06/24/20 1208  TempSrc:   PainSc: 0-No pain      Patients Stated Pain Goal: 4 (94/49/67 5916)  Complications: No complications documented.

## 2020-06-24 NOTE — Anesthesia Postprocedure Evaluation (Signed)
Anesthesia Post Note  Patient: Brian [name]  Procedure(s) Performed: RIGHT OPEN INGUINAL HERNIA REPAIR WITH MESH (Right Inguinal)     Patient location during evaluation: PACU Anesthesia Type: General Level of consciousness: awake and alert Pain management: pain level controlled Vital Signs Assessment: post-procedure vital signs reviewed and stable Respiratory status: spontaneous breathing, nonlabored ventilation, respiratory function stable and patient connected to nasal cannula oxygen Cardiovascular status: blood pressure returned to baseline and stable Postop Assessment: no apparent nausea or vomiting Anesthetic complications: no   No complications documented.  Last Vitals:  Vitals:   06/24/20 1640 06/24/20 1700  BP: 128/82 122/66  Pulse:    Resp: 14 16  Temp: 36.7 C 36.7 C  SpO2: 93% 98%    Last Pain:  Vitals:   06/24/20 1700  TempSrc:   PainSc: 3                  Effie Berkshire

## 2020-06-24 NOTE — Anesthesia Preprocedure Evaluation (Addendum)
Anesthesia Evaluation  Patient identified by MRN, date of birth, ID band Patient awake    Reviewed: Allergy & Precautions, NPO status , Patient's Chart, lab work & pertinent test results  Airway Mallampati: II  TM Distance: >3 FB Neck ROM: Full    Dental  (+) Teeth Intact, Dental Advisory Given   Pulmonary COPD,  COPD inhaler,    breath sounds clear to auscultation       Cardiovascular hypertension, Pt. on medications  Rhythm:Regular Rate:Normal     Neuro/Psych  Headaches, negative psych ROS   GI/Hepatic negative GI ROS, Neg liver ROS,   Endo/Other  diabetes, Type 2, Oral Hypoglycemic AgentsHypothyroidism   Renal/GU negative Renal ROS     Musculoskeletal negative musculoskeletal ROS (+)   Abdominal Normal abdominal exam  (+)   Peds  Hematology negative hematology ROS (+)   Anesthesia Other Findings - Bruising R lower abdomen  Reproductive/Obstetrics                            Anesthesia Physical Anesthesia Plan  ASA: III  Anesthesia Plan: General   Post-op Pain Management: GA combined w/ Regional for post-op pain   Induction: Intravenous  PONV Risk Score and Plan: 3 and Ondansetron, Dexamethasone and Midazolam  Airway Management Planned: LMA and Oral ETT  Additional Equipment: None  Intra-op Plan:   Post-operative Plan: Extubation in OR  Informed Consent: I have reviewed the patients History and Physical, chart, labs and discussed the procedure including the risks, benefits and alternatives for the proposed anesthesia with the patient or authorized representative who has indicated his/her understanding and acceptance.     Dental advisory given  Plan Discussed with: CRNA  Anesthesia Plan Comments: (Lab Results      Component                Value               Date                      WBC                      14.7 (H)            06/21/2020                HGB                       13.6                06/21/2020                HCT                      40.0                06/21/2020                MCV                      95.7                06/21/2020                PLT                      293  06/21/2020            Echo:  1. Left ventricular ejection fraction, by estimation, is 55 to 60%. The  left ventricle has normal function. The left ventricle has no regional  wall motion abnormalities. Left ventricular diastolic parameters were  normal.  2. Right ventricular systolic function is normal. The right ventricular  size is normal. Tricuspid regurgitation signal is inadequate for assessing  PA pressure.  3. The mitral valve is normal in structure. Trivial mitral valve  regurgitation. No evidence of mitral stenosis.  4. The aortic valve is tricuspid. There is mild calcification of the  aortic valve. Aortic valve regurgitation is not visualized. No aortic  stenosis is present.  5. The inferior vena cava is normal in size with greater than 50%  respiratory variability, suggesting right atrial pressure of 3 mmHg. )       Anesthesia Quick Evaluation

## 2020-06-25 ENCOUNTER — Encounter (HOSPITAL_COMMUNITY): Payer: Self-pay | Admitting: General Surgery

## 2020-07-04 ENCOUNTER — Inpatient Hospital Stay: Payer: BC Managed Care – PPO | Attending: Internal Medicine | Admitting: Internal Medicine

## 2020-07-04 ENCOUNTER — Inpatient Hospital Stay: Payer: BC Managed Care – PPO

## 2020-07-04 ENCOUNTER — Encounter: Payer: Self-pay | Admitting: Internal Medicine

## 2020-07-04 ENCOUNTER — Other Ambulatory Visit: Payer: Self-pay

## 2020-07-04 VITALS — BP 108/87 | HR 95 | Temp 97.2°F | Resp 18 | Ht 68.0 in | Wt 162.5 lb

## 2020-07-04 DIAGNOSIS — E119 Type 2 diabetes mellitus without complications: Secondary | ICD-10-CM | POA: Diagnosis not present

## 2020-07-04 DIAGNOSIS — C3491 Malignant neoplasm of unspecified part of right bronchus or lung: Secondary | ICD-10-CM

## 2020-07-04 DIAGNOSIS — Z923 Personal history of irradiation: Secondary | ICD-10-CM | POA: Insufficient documentation

## 2020-07-04 DIAGNOSIS — C7931 Secondary malignant neoplasm of brain: Secondary | ICD-10-CM

## 2020-07-04 DIAGNOSIS — C3411 Malignant neoplasm of upper lobe, right bronchus or lung: Secondary | ICD-10-CM | POA: Diagnosis not present

## 2020-07-04 DIAGNOSIS — Z7984 Long term (current) use of oral hypoglycemic drugs: Secondary | ICD-10-CM | POA: Diagnosis not present

## 2020-07-04 DIAGNOSIS — I10 Essential (primary) hypertension: Secondary | ICD-10-CM | POA: Diagnosis not present

## 2020-07-04 DIAGNOSIS — C770 Secondary and unspecified malignant neoplasm of lymph nodes of head, face and neck: Secondary | ICD-10-CM | POA: Insufficient documentation

## 2020-07-04 DIAGNOSIS — E039 Hypothyroidism, unspecified: Secondary | ICD-10-CM

## 2020-07-04 DIAGNOSIS — C3490 Malignant neoplasm of unspecified part of unspecified bronchus or lung: Secondary | ICD-10-CM | POA: Diagnosis not present

## 2020-07-04 DIAGNOSIS — J449 Chronic obstructive pulmonary disease, unspecified: Secondary | ICD-10-CM | POA: Diagnosis not present

## 2020-07-04 DIAGNOSIS — Z79899 Other long term (current) drug therapy: Secondary | ICD-10-CM | POA: Diagnosis not present

## 2020-07-04 DIAGNOSIS — Z5111 Encounter for antineoplastic chemotherapy: Secondary | ICD-10-CM

## 2020-07-04 DIAGNOSIS — Z7901 Long term (current) use of anticoagulants: Secondary | ICD-10-CM | POA: Insufficient documentation

## 2020-07-04 DIAGNOSIS — Z9221 Personal history of antineoplastic chemotherapy: Secondary | ICD-10-CM | POA: Insufficient documentation

## 2020-07-04 LAB — CMP (CANCER CENTER ONLY)
ALT: 29 U/L (ref 0–44)
AST: 18 U/L (ref 15–41)
Albumin: 3.3 g/dL — ABNORMAL LOW (ref 3.5–5.0)
Alkaline Phosphatase: 129 U/L — ABNORMAL HIGH (ref 38–126)
Anion gap: 10 (ref 5–15)
BUN: 23 mg/dL — ABNORMAL HIGH (ref 6–20)
CO2: 21 mmol/L — ABNORMAL LOW (ref 22–32)
Calcium: 9.1 mg/dL (ref 8.9–10.3)
Chloride: 108 mmol/L (ref 98–111)
Creatinine: 0.91 mg/dL (ref 0.61–1.24)
GFR, Estimated: >60 mL/min (ref >60)
Glucose, Bld: 104 mg/dL — ABNORMAL HIGH (ref 70–99)
Potassium: 3.9 mmol/L (ref 3.5–5.1)
Sodium: 139 mmol/L (ref 135–145)
Total Bilirubin: 0.8 mg/dL (ref 0.3–1.2)
Total Protein: 6.2 g/dL — ABNORMAL LOW (ref 6.5–8.1)

## 2020-07-04 LAB — CBC WITH DIFFERENTIAL (CANCER CENTER ONLY)
Abs Immature Granulocytes: 0.14 10*3/uL — ABNORMAL HIGH (ref 0.00–0.07)
Basophils Absolute: 0.1 10*3/uL (ref 0.0–0.1)
Basophils Relative: 1 %
Eosinophils Absolute: 0.5 10*3/uL (ref 0.0–0.5)
Eosinophils Relative: 4 %
HCT: 39 % (ref 39.0–52.0)
Hemoglobin: 13.2 g/dL (ref 13.0–17.0)
Immature Granulocytes: 1 %
Lymphocytes Relative: 8 %
Lymphs Abs: 1.1 10*3/uL (ref 0.7–4.0)
MCH: 31.1 pg (ref 26.0–34.0)
MCHC: 33.8 g/dL (ref 30.0–36.0)
MCV: 91.8 fL (ref 80.0–100.0)
Monocytes Absolute: 1.7 10*3/uL — ABNORMAL HIGH (ref 0.1–1.0)
Monocytes Relative: 13 %
Neutro Abs: 9.7 10*3/uL — ABNORMAL HIGH (ref 1.7–7.7)
Neutrophils Relative %: 73 %
Platelet Count: 284 10*3/uL (ref 150–400)
RBC: 4.25 MIL/uL (ref 4.22–5.81)
RDW: 16.5 % — ABNORMAL HIGH (ref 11.5–15.5)
WBC Count: 13.2 10*3/uL — ABNORMAL HIGH (ref 4.0–10.5)
nRBC: 0 % (ref 0.0–0.2)

## 2020-07-04 LAB — CK: Total CK: 45 U/L — ABNORMAL LOW (ref 49–397)

## 2020-07-04 LAB — TSH: TSH: 3.713 u[IU]/mL (ref 0.320–4.118)

## 2020-07-04 NOTE — Progress Notes (Signed)
[facility] [phone]   [phone]  OFFICE PROGRESS NOTE  [name], MD [place] [id number]  DIAGNOSIS:  Metastatic non-small cell lung cancer initially diagnosed as stage IIIB (T1c, N3, M0)non-small cell lung cancer, adenocarcinoma presented with right upper lobe lung mass in addition to mediastinal and bilateral supraclavicular lymphadenopathy diagnosed in January 2020.  He had evidence of disease recurrence in June 2021 with adenopathy in the subcarinal, level 3 left neck lymph node, and lymph node at the curious of the diaphragm and in the upper abdomen.  PD-L1: 10%  Guardant 360 molecular studiesshowed no actionable mutation  Foundation One Testing:  Biomarker Findings Microsatellite status - MS-Stable Tumor Mutational Burden - 4 Muts/Mb Genomic Findings For a complete list of the genes assayed, please refer to the Appendix. ALK EML4-ALK fusion (Variant 2) SMARCB1 R377C CTNNB1 S45P CDKN2A/B CDKN2B loss, CDKN2A loss CXCR4 Y195* KD32 splice site 671I>W 7 Disease relevant genes with no reportable alterations: BRAF, EGFR, ERBB2, KRAS, MET, RET, ROS1  PRIOR THERAPY: 1)Concurrent chemoradiation with weekly carboplatin for AUC of 2 and paclitaxel 45 mg/M2.Status post 6 cycles. Last dose was given on October 10, 2018 with stable disease. 2) Radiation treatment to the enlarging right cervical lymph nodes under the care of Dr. Sondra Come. First treatment 03/06/2019. Last treatment scheduledon9/05/2019 3)Consolidation treatment with immunotherapy with Imfinzi 10 mg/KG every 2 weeks. First dose November 17, 2018. Status post26cycles. 4) Systemic chemotherapy with carboplatin for an AUC of 5, Alimta 500 mg/m2, and Keytruda 200 mg IV every 3 weeks. First dose expected on 02/14/20. Status post 1 cycle.  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 5) SBRT to the left lower lobe lung nodule.  CURRENT THERAPY:  [name] ([name]) 600 mg p.o. twice daily.  He started the first dose on March 08, 2020.   Status post 4 months of treatment.  INTERVAL HISTORY: Brian [name] 59 y.o. male returns to the clinic today for follow-up visit accompanied by his wife.  The patient is feeling fine today with no concerning complaints.  He had hernia surgery on June 24, 2020 and he recovered very well.  He denied having any current chest pain, shortness of breath, cough or hemoptysis.  He denied having any fever or chills.  He has no nausea, vomiting, diarrhea or constipation.  He has no headache or visual changes.  He is here today for evaluation and repeat blood work.  MEDICAL HISTORY: Past Medical History:  Diagnosis Date  . COPD (chronic obstructive pulmonary disease) ([place])   . Diabetes mellitus without complication ([place])   . Hypertension   . nscl ca dx'd 08/2018  . Pneumonia   . Subarachnoid hemorrhage ([facility])     ALLERGIES:  has No Known Allergies.  MEDICATIONS:  Current Outpatient Medications  Medication Sig Dispense Refill  . albuterol (VENTOLIN HFA) 108 (90 Base) MCG/ACT inhaler Inhale 2 puffs into the lungs every 4 (four) hours as needed for wheezing or shortness of breath. 6.7 g 5  . alectinib ([name]) 150 MG capsule Take 4 capsules (600 mg total) by mouth 2 (two) times daily with a meal. 720 capsule 0  . amLODipine (NORVASC) 5 MG tablet Take 5 mg by mouth daily.     Marland Kitchen atorvastatin (LIPITOR) 20 MG tablet Take 20 mg by mouth daily.     . B Complex Vitamins (  B COMPLEX PO) Take 1 tablet by mouth daily.    . benzonatate (TESSALON) 200 MG capsule TAKE ONE CAP 2-3 TIMES A DAY FOR COUGH (Patient taking differently: TAKE ONE CAP 2-3 TIMES A DAY FOR COUGH) 60 capsule 2  . cholecalciferol (VITAMIN D3) 25 MCG (1000 UNIT) tablet Take 1,000 Units by mouth daily.    Marland Kitchen enoxaparin (LOVENOX) 120 MG/0.8ML injection INJECT  0.8 MLS (120 MG TOTAL) INTO THE SKIN DAILY. 24 mL 2  . HYDROcodone-acetaminophen (NORCO/VICODIN) 5-325 MG tablet Take 1 tablet by mouth every 6 (six) hours as needed for moderate pain. 15 tablet 0  . ibuprofen (ADVIL) 800 MG tablet Take 1 tablet (800 mg total) by mouth every 8 (eight) hours as needed. 30 tablet 0  . levothyroxine (SYNTHROID) 100 MCG tablet Take 1 tablet (100 mcg total) by mouth daily before breakfast. 90 tablet 0  . metFORMIN (GLUCOPHAGE) 500 MG tablet Take 500 mg by mouth daily with breakfast.     . Multiple Vitamin (MULTIVITAMIN) tablet Take 1 tablet by mouth daily.    . prochlorperazine (COMPAZINE) 10 MG tablet Take 1 tablet (10 mg total) by mouth every 6 (six) hours as needed. (Patient taking differently: Take 10 mg by mouth every 6 (six) hours as needed for nausea or vomiting. ) 30 tablet 2  . Respiratory Therapy Supplies (FLUTTER) DEVI Please use up 10 times daily(4-5 breaths, 4-5 times daily) 1 each 0  . TRELEGY ELLIPTA 100-62.5-25 MCG/INH AEPB Inhale 1 puff into the lungs daily.    Marland Kitchen venlafaxine (EFFEXOR) 37.5 MG tablet Take 37.5 mg by mouth daily.      No current facility-administered medications for this visit.    SURGICAL HISTORY:  Past Surgical History:  Procedure Laterality Date  . INGUINAL HERNIA REPAIR Right 06/24/2020   Procedure: RIGHT OPEN INGUINAL HERNIA REPAIR WITH MESH;  Surgeon: Kinsinger, Arta Bruce, MD;  Location: [facility];  Service: General;  Laterality: Right;  . IR IVC FILTER PLMT / S&I Burke Keels GUID/MOD SED  03/04/2020  . LEG SURGERY  age 54    left leg, from fracture    REVIEW OF SYSTEMS:  A comprehensive review of systems was negative.   PHYSICAL EXAMINATION: General appearance: alert, cooperative and no distress Head: Normocephalic, without obvious abnormality, atraumatic Neck: no adenopathy, no JVD, supple, symmetrical, trachea midline and thyroid not enlarged, symmetric, no tenderness/mass/nodules Lymph nodes: Cervical, supraclavicular, and  axillary nodes normal. Resp: clear to auscultation bilaterally Back: symmetric, no curvature. ROM normal. No CVA tenderness. Cardio: regular rate and rhythm, S1, S2 normal, no murmur, click, rub or gallop GI: soft, non-tender; bowel sounds normal; no masses,  no organomegaly Extremities: extremities normal, atraumatic, no cyanosis or edema  ECOG PERFORMANCE STATUS: 1 - Symptomatic but completely ambulatory  Blood pressure 108/87, pulse 95, temperature (!) 97.2 F (36.2 C), temperature source Tympanic, resp. rate 18, height _0  (1.727 m), weight 162 lb 8 oz (73.7 kg), SpO2 96 %.  LABORATORY DATA: Lab Results  Component Value Date   WBC 13.2 (H) 07/04/2020   HGB 13.2 07/04/2020   HCT 39.0 07/04/2020   MCV 91.8 07/04/2020   PLT 284 07/04/2020      Chemistry      Component Value Date/Time   NA 137 06/21/2020 0819   K 4.0 06/21/2020 0819   CL 103 06/21/2020 0819   CO2 25 06/21/2020 0819   BUN 18 06/21/2020 0819   CREATININE 0.84 06/21/2020 0819   CREATININE 0.93 06/05/2020 1016  Component Value Date/Time   CALCIUM 8.7 (L) 06/21/2020 0819   ALKPHOS 210 (H) 06/05/2020 1016   AST 28 06/05/2020 1016   ALT 26 06/05/2020 1016   BILITOT 0.7 06/05/2020 1016       RADIOGRAPHIC STUDIES: No results found.  ASSESSMENT AND PLAN: This is a very pleasant 60 years old white male with metastatic non-small cell lung cancer, adenocarcinoma now with ALK gene translocation.  The patient was initially diagnosed as a stage IIIa non-small cell lung cancer status post a course of concurrent chemoradiation with weekly carboplatin and paclitaxel followed by 1 year of consolidation treatment with immunotherapy with Imfinzi. He had evidence for disease progression recently. Repeat tissue biopsy from the left supraclavicular lymph nodes and molecular studies showed that the patient has positive ALK gene translocation.  His previous molecular studies by [employer] 360 was negative. I had a lengthy  discussion with the patient today about his condition and treatment options. He received 1 cycle of systemic chemotherapy with carboplatin, Alimta and Keytruda.  We will discontinue his systemic chemotherapy for now because of the new findings on the molecular studies. The patient is currently on treatment with [name] 600 mg p.o. twice daily.  He is status post 4 months of treatment.  The patient has been tolerating his treatment fairly well with no concerning adverse effects. I recommended for him to continue his current treatment with [name] with the same dose. I will see the patient back for follow-up visit in 1 months for evaluation with repeat CT scan of the chest, abdomen pelvis for restaging of his disease. For the hypothyroidism, he is currently on levothyroxine to 100 mcg p.o. daily. The patient was advised to call immediately if he has any concerning symptoms in the interval. The patient voices understanding of current disease status and treatment options and is in agreement with the current care plan.  All questions were answered. The patient knows to call the clinic with any problems, questions or concerns. We can certainly see the patient much sooner if necessary.  Disclaimer: This note was dictated with voice recognition software. Similar sounding words can inadvertently be transcribed and may not be corrected upon review.

## 2020-07-08 ENCOUNTER — Other Ambulatory Visit: Payer: Self-pay | Admitting: Medical

## 2020-07-08 ENCOUNTER — Encounter: Payer: Self-pay | Admitting: Internal Medicine

## 2020-07-08 DIAGNOSIS — C3491 Malignant neoplasm of unspecified part of right bronchus or lung: Secondary | ICD-10-CM

## 2020-07-08 NOTE — Telephone Encounter (Signed)
Dr. Julien Nordmann, Sending to you for approval or refusal as this was in [facility]'s pool of prescription. Gardiner Rhyme, RN

## 2020-07-09 ENCOUNTER — Other Ambulatory Visit: Payer: Self-pay | Admitting: Internal Medicine

## 2020-07-09 ENCOUNTER — Telehealth: Payer: Self-pay | Admitting: Internal Medicine

## 2020-07-09 DIAGNOSIS — E039 Hypothyroidism, unspecified: Secondary | ICD-10-CM

## 2020-07-09 MED ORDER — LEVOTHYROXINE SODIUM 100 MCG PO TABS: 100.0000 ug | ORAL_TABLET | Freq: Every day | ORAL | 1 refills | Status: DC

## 2020-07-09 NOTE — Telephone Encounter (Signed)
Scheduled per los. Called and spoke with patient. Confirmed appt 

## 2020-07-20 DIAGNOSIS — J9601 Acute respiratory failure with hypoxia: Secondary | ICD-10-CM | POA: Diagnosis not present

## 2020-07-20 DIAGNOSIS — I10 Essential (primary) hypertension: Secondary | ICD-10-CM | POA: Diagnosis not present

## 2020-07-30 ENCOUNTER — Other Ambulatory Visit: Payer: Self-pay | Admitting: Radiation Therapy

## 2020-07-30 ENCOUNTER — Ambulatory Visit (HOSPITAL_COMMUNITY)
Admission: RE | Admit: 2020-07-30 | Discharge: 2020-07-30 | Disposition: A | Discharge status: Home / Self Care | Payer: BC Managed Care – PPO | Source: Ambulatory Visit | Attending: Internal Medicine | Admitting: Internal Medicine

## 2020-07-30 ENCOUNTER — Other Ambulatory Visit: Payer: Self-pay

## 2020-07-30 ENCOUNTER — Telehealth: Payer: Self-pay | Admitting: Radiation Therapy

## 2020-07-30 ENCOUNTER — Encounter (HOSPITAL_COMMUNITY): Payer: Self-pay

## 2020-07-30 ENCOUNTER — Inpatient Hospital Stay: Payer: BC Managed Care – PPO

## 2020-07-30 DIAGNOSIS — J8410 Pulmonary fibrosis, unspecified: Secondary | ICD-10-CM | POA: Diagnosis not present

## 2020-07-30 DIAGNOSIS — C3490 Malignant neoplasm of unspecified part of unspecified bronchus or lung: Secondary | ICD-10-CM

## 2020-07-30 DIAGNOSIS — Z5111 Encounter for antineoplastic chemotherapy: Secondary | ICD-10-CM | POA: Diagnosis not present

## 2020-07-30 DIAGNOSIS — Z79899 Other long term (current) drug therapy: Secondary | ICD-10-CM | POA: Diagnosis not present

## 2020-07-30 DIAGNOSIS — K4090 Unilateral inguinal hernia, without obstruction or gangrene, not specified as recurrent: Secondary | ICD-10-CM | POA: Diagnosis not present

## 2020-07-30 DIAGNOSIS — I10 Essential (primary) hypertension: Secondary | ICD-10-CM | POA: Diagnosis not present

## 2020-07-30 DIAGNOSIS — N281 Cyst of kidney, acquired: Secondary | ICD-10-CM | POA: Diagnosis not present

## 2020-07-30 DIAGNOSIS — E039 Hypothyroidism, unspecified: Secondary | ICD-10-CM | POA: Diagnosis not present

## 2020-07-30 DIAGNOSIS — J449 Chronic obstructive pulmonary disease, unspecified: Secondary | ICD-10-CM | POA: Diagnosis not present

## 2020-07-30 DIAGNOSIS — Z9221 Personal history of antineoplastic chemotherapy: Secondary | ICD-10-CM | POA: Diagnosis not present

## 2020-07-30 DIAGNOSIS — J90 Pleural effusion, not elsewhere classified: Secondary | ICD-10-CM | POA: Diagnosis not present

## 2020-07-30 DIAGNOSIS — Z7984 Long term (current) use of oral hypoglycemic drugs: Secondary | ICD-10-CM | POA: Diagnosis not present

## 2020-07-30 DIAGNOSIS — Z923 Personal history of irradiation: Secondary | ICD-10-CM | POA: Diagnosis not present

## 2020-07-30 DIAGNOSIS — Z85118 Personal history of other malignant neoplasm of bronchus and lung: Secondary | ICD-10-CM | POA: Diagnosis not present

## 2020-07-30 DIAGNOSIS — C7931 Secondary malignant neoplasm of brain: Secondary | ICD-10-CM

## 2020-07-30 DIAGNOSIS — C770 Secondary and unspecified malignant neoplasm of lymph nodes of head, face and neck: Secondary | ICD-10-CM | POA: Diagnosis not present

## 2020-07-30 DIAGNOSIS — E119 Type 2 diabetes mellitus without complications: Secondary | ICD-10-CM | POA: Diagnosis not present

## 2020-07-30 DIAGNOSIS — C3411 Malignant neoplasm of upper lobe, right bronchus or lung: Secondary | ICD-10-CM | POA: Diagnosis not present

## 2020-07-30 DIAGNOSIS — Z7901 Long term (current) use of anticoagulants: Secondary | ICD-10-CM | POA: Diagnosis not present

## 2020-07-30 LAB — CBC WITH DIFFERENTIAL (CANCER CENTER ONLY)
Abs Immature Granulocytes: 0.18 10*3/uL — ABNORMAL HIGH (ref 0.00–0.07)
Basophils Absolute: 0.1 10*3/uL (ref 0.0–0.1)
Basophils Relative: 1 %
Eosinophils Absolute: 0.2 10*3/uL (ref 0.0–0.5)
Eosinophils Relative: 2 %
HCT: 38.6 % — ABNORMAL LOW (ref 39.0–52.0)
Hemoglobin: 13.2 g/dL (ref 13.0–17.0)
Immature Granulocytes: 2 %
Lymphocytes Relative: 11 %
Lymphs Abs: 1.1 10*3/uL (ref 0.7–4.0)
MCH: 31.7 pg (ref 26.0–34.0)
MCHC: 34.2 g/dL (ref 30.0–36.0)
MCV: 92.8 fL (ref 80.0–100.0)
Monocytes Absolute: 1.5 10*3/uL — ABNORMAL HIGH (ref 0.1–1.0)
Monocytes Relative: 15 %
Neutro Abs: 7.1 10*3/uL (ref 1.7–7.7)
Neutrophils Relative %: 69 %
Platelet Count: 254 10*3/uL (ref 150–400)
RBC: 4.16 MIL/uL — ABNORMAL LOW (ref 4.22–5.81)
RDW: 17.3 % — ABNORMAL HIGH (ref 11.5–15.5)
WBC Count: 10.3 10*3/uL (ref 4.0–10.5)
nRBC: 0 % (ref 0.0–0.2)

## 2020-07-30 LAB — CMP (CANCER CENTER ONLY)
ALT: 32 U/L (ref 0–44)
AST: 22 U/L (ref 15–41)
Albumin: 3.4 g/dL — ABNORMAL LOW (ref 3.5–5.0)
Alkaline Phosphatase: 131 U/L — ABNORMAL HIGH (ref 38–126)
Anion gap: 4 — ABNORMAL LOW (ref 5–15)
BUN: 16 mg/dL (ref 6–20)
CO2: 28 mmol/L (ref 22–32)
Calcium: 8.9 mg/dL (ref 8.9–10.3)
Chloride: 106 mmol/L (ref 98–111)
Creatinine: 0.95 mg/dL (ref 0.61–1.24)
GFR, Estimated: >60 mL/min (ref >60)
Glucose, Bld: 87 mg/dL (ref 70–99)
Potassium: 4.4 mmol/L (ref 3.5–5.1)
Sodium: 138 mmol/L (ref 135–145)
Total Bilirubin: 0.7 mg/dL (ref 0.3–1.2)
Total Protein: 6.2 g/dL — ABNORMAL LOW (ref 6.5–8.1)

## 2020-07-30 MED ORDER — IOHEXOL 300 MG/ML  SOLN
100.0000 mL | Freq: Once | INTRAMUSCULAR | Status: AC | PRN
  Administered 2020-07-30: 100 mL via INTRAVENOUS

## 2020-07-30 NOTE — Telephone Encounter (Signed)
I spoke with Brian [name] about his upcoming brain MRI and virtual follow-up with Bryson Ha in March, 2022.   Mont Dutton R.T.(R)(T) Radiation Special Procedures Navigator

## 2020-08-01 ENCOUNTER — Telehealth: Payer: Self-pay | Admitting: Internal Medicine

## 2020-08-01 ENCOUNTER — Inpatient Hospital Stay (HOSPITAL_BASED_OUTPATIENT_CLINIC_OR_DEPARTMENT_OTHER): Payer: BC Managed Care – PPO | Admitting: Internal Medicine

## 2020-08-01 ENCOUNTER — Other Ambulatory Visit: Payer: Self-pay

## 2020-08-01 VITALS — BP 112/80 | HR 84 | Temp 98.6°F | Resp 18 | Ht 68.0 in | Wt 169.6 lb

## 2020-08-01 DIAGNOSIS — C770 Secondary and unspecified malignant neoplasm of lymph nodes of head, face and neck: Secondary | ICD-10-CM | POA: Diagnosis not present

## 2020-08-01 DIAGNOSIS — C3411 Malignant neoplasm of upper lobe, right bronchus or lung: Secondary | ICD-10-CM | POA: Diagnosis not present

## 2020-08-01 DIAGNOSIS — Z7901 Long term (current) use of anticoagulants: Secondary | ICD-10-CM | POA: Diagnosis not present

## 2020-08-01 DIAGNOSIS — Z5111 Encounter for antineoplastic chemotherapy: Secondary | ICD-10-CM

## 2020-08-01 DIAGNOSIS — E119 Type 2 diabetes mellitus without complications: Secondary | ICD-10-CM | POA: Diagnosis not present

## 2020-08-01 DIAGNOSIS — C3491 Malignant neoplasm of unspecified part of right bronchus or lung: Secondary | ICD-10-CM

## 2020-08-01 DIAGNOSIS — Z9221 Personal history of antineoplastic chemotherapy: Secondary | ICD-10-CM | POA: Diagnosis not present

## 2020-08-01 DIAGNOSIS — Z79899 Other long term (current) drug therapy: Secondary | ICD-10-CM | POA: Diagnosis not present

## 2020-08-01 DIAGNOSIS — I10 Essential (primary) hypertension: Secondary | ICD-10-CM

## 2020-08-01 DIAGNOSIS — E039 Hypothyroidism, unspecified: Secondary | ICD-10-CM | POA: Diagnosis not present

## 2020-08-01 DIAGNOSIS — C7931 Secondary malignant neoplasm of brain: Secondary | ICD-10-CM

## 2020-08-01 DIAGNOSIS — J449 Chronic obstructive pulmonary disease, unspecified: Secondary | ICD-10-CM | POA: Diagnosis not present

## 2020-08-01 DIAGNOSIS — Z7984 Long term (current) use of oral hypoglycemic drugs: Secondary | ICD-10-CM | POA: Diagnosis not present

## 2020-08-01 DIAGNOSIS — Z923 Personal history of irradiation: Secondary | ICD-10-CM | POA: Diagnosis not present

## 2020-08-01 NOTE — Progress Notes (Signed)
[name] [phone]   [phone]  OFFICE PROGRESS NOTE  Brian Broker, MD [place] [address]  DIAGNOSIS:  Metastatic non-small cell lung cancer initially diagnosed as stage IIIB (T1c, N3, M0)non-small cell lung cancer, adenocarcinoma presented with right upper lobe lung mass in addition to mediastinal and bilateral supraclavicular lymphadenopathy diagnosed in January 2020.  He had evidence of disease recurrence in June 2021 with adenopathy in the subcarinal, level 3 left neck lymph node, and lymph node at the curious of the diaphragm and in the upper abdomen.  PD-L1: 10%  Guardant 360 molecular studiesshowed no actionable mutation  [facility] [employer] Testing:  Biomarker Findings Microsatellite status - MS-Stable Tumor Mutational Burden - 4 Muts/Mb Genomic Findings For a complete list of the genes assayed, please refer to the Appendix. ALK EML4-ALK fusion (Variant 2) SMARCB1 R377C CTNNB1 S45P CDKN2A/B CDKN2B loss, CDKN2A loss CXCR4 W409* BD53 splice site 299M>E 7 Disease relevant genes with no reportable alterations: BRAF, EGFR, ERBB2, KRAS, MET, RET, ROS1  PRIOR THERAPY: 1)Concurrent chemoradiation with weekly carboplatin for AUC of 2 and paclitaxel 45 mg/M2.Status post 6 cycles. Last dose was given on October 10, 2018 with stable disease. 2) Radiation treatment to the enlarging right cervical lymph nodes under the care of Dr. Sondra [name]. First treatment 03/06/2019. Last treatment scheduledon9/05/2019 3)Consolidation treatment with immunotherapy with Imfinzi 10 mg/KG every 2 weeks. First dose November 17, 2018. Status post26cycles. 4) Systemic chemotherapy with carboplatin for an AUC of 5, Alimta 500 mg/m2, and Keytruda 200 mg IV every 3 weeks. First dose expected on 02/14/20. Status post 1 cycle.  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 5) SBRT to the left lower lobe lung nodule.  CURRENT THERAPY:  [name] ([name]) 600 mg p.o. twice daily.  He started the first dose on March 08, 2020.   Status post 5 months of treatment.  INTERVAL HISTORY: Brian [name] 59 y.o. male returns to the clinic today for follow-up visit accompanied by his wife.  The patient is feeling fine today with no concerning complaints except for mild shortness of breath with exertion.  He denied having any current chest pain, shortness of breath except with exertion with no cough or hemoptysis.  He denied having any fever or chills.  He has no nausea, vomiting, diarrhea or constipation.  He denied having any headache or visual changes.  He continues to tolerate his treatment with [name] fairly well.  The patient had repeat CT scan of the chest, abdomen pelvis performed recently and is here for evaluation and discussion of his discuss results.  MEDICAL HISTORY: Past Medical History:  Diagnosis Date  . COPD (chronic obstructive pulmonary disease) ([facility])   . Diabetes mellitus without complication ([place] [facility])   . Hypertension   . nscl ca dx'd 08/2018  . Pneumonia   . Subarachnoid hemorrhage ([facility])     ALLERGIES:  has No Known Allergies.  MEDICATIONS:  Current Outpatient Medications  Medication Sig Dispense Refill  . albuterol (VENTOLIN HFA) 108 (90 Base) MCG/ACT inhaler Inhale 2 puffs into the lungs every 4 (four) hours as needed for wheezing or shortness of breath. 6.7 g 5  . [name] 150 MG capsule TAKE 4 CAPSULES BY MOUTH TWICE DAILY WITH FOOD. SWALLOW CAPSULES WHOLE. DO NOT OPEN OR CRUSH. STORE IN ORIGINAL CONTAINER. 240 capsule 2  . amLODipine (NORVASC) 5 MG tablet  Take 5 mg by mouth daily.     Marland Kitchen atorvastatin (LIPITOR) 20 MG tablet Take 20 mg by mouth daily.     . B Complex Vitamins (B COMPLEX PO) Take 1 tablet by mouth daily.    . benzonatate (TESSALON) 200 MG capsule TAKE ONE CAP 2-3 TIMES A DAY FOR COUGH (Patient taking  differently: Take 200 mg by mouth 3 (three) times daily as needed for cough.) 60 capsule 2  . cholecalciferol (VITAMIN D3) 25 MCG (1000 UNIT) tablet Take 1,000 Units by mouth daily.    Marland Kitchen HYDROcodone-acetaminophen (NORCO/VICODIN) 5-325 MG tablet Take 1 tablet by mouth every 6 (six) hours as needed for moderate pain. 15 tablet 0  . ibuprofen (ADVIL) 800 MG tablet Take 1 tablet (800 mg total) by mouth every 8 (eight) hours as needed. 30 tablet 0  . levothyroxine (SYNTHROID) 100 MCG tablet Take 1 tablet (100 mcg total) by mouth daily before breakfast. 90 tablet 1  . metFORMIN (GLUCOPHAGE) 500 MG tablet Take 500 mg by mouth daily with breakfast.     . Multiple Vitamin (MULTIVITAMIN) tablet Take 1 tablet by mouth daily.    . prochlorperazine (COMPAZINE) 10 MG tablet Take 1 tablet (10 mg total) by mouth every 6 (six) hours as needed. (Patient taking differently: Take 10 mg by mouth every 6 (six) hours as needed for nausea or vomiting.) 30 tablet 2  . Respiratory Therapy Supplies (FLUTTER) DEVI Please use up 10 times daily(4-5 breaths, 4-5 times daily) 1 each 0  . TRELEGY ELLIPTA 100-62.5-25 MCG/INH AEPB Inhale 1 puff into the lungs daily.    Marland Kitchen venlafaxine (EFFEXOR) 37.5 MG tablet Take 37.5 mg by mouth daily.     Marland Kitchen enoxaparin (LOVENOX) 120 MG/0.8ML injection INJECT 0.8 MLS (120 MG TOTAL) INTO THE SKIN DAILY. 24 mL 2   No current facility-administered medications for this visit.    SURGICAL HISTORY:  Past Surgical History:  Procedure Laterality Date  . INGUINAL HERNIA REPAIR Right 06/24/2020   Procedure: RIGHT OPEN INGUINAL HERNIA REPAIR WITH MESH;  Surgeon: Brian [name], Brian Bruce, MD;  Location: [facility];  Service: General;  Laterality: Right;  . IR IVC FILTER PLMT / S&I Brian [name] GUID/MOD SED  03/04/2020  . LEG SURGERY  age 83    left leg, from fracture    REVIEW OF SYSTEMS:  Constitutional: negative Eyes: negative Ears, nose, mouth, throat, and face: negative Respiratory: positive for dyspnea on  exertion Cardiovascular: negative Gastrointestinal: negative Genitourinary:negative Integument/breast: negative Hematologic/lymphatic: negative Musculoskeletal:negative Neurological: negative Behavioral/Psych: negative Endocrine: negative Allergic/Immunologic: negative   PHYSICAL EXAMINATION: General appearance: alert, cooperative and no distress Head: Normocephalic, without obvious abnormality, atraumatic Neck: no adenopathy, no JVD, supple, symmetrical, trachea midline and thyroid not enlarged, symmetric, no tenderness/mass/nodules Lymph nodes: Cervical, supraclavicular, and axillary nodes normal. Resp: diminished breath sounds RLL and dullness to percussion RLL Back: symmetric, no curvature. ROM normal. No CVA tenderness. Cardio: regular rate and rhythm, S1, S2 normal, no murmur, click, rub or gallop GI: soft, non-tender; bowel sounds normal; no masses,  no organomegaly Extremities: extremities normal, atraumatic, no cyanosis or edema Neurologic: Alert and oriented X 3, normal strength and tone. Normal symmetric reflexes. Normal coordination and gait  ECOG PERFORMANCE STATUS: 1 - Symptomatic but completely ambulatory  Blood pressure 112/80, pulse 84, temperature 98.6 F (37 C), temperature source Tympanic, resp. rate 18, height _0  (1.727 m), weight 169 lb 9.6 oz (76.9 kg), SpO2 98 %.  LABORATORY DATA: Lab Results  Component Value Date   WBC 10.3  07/30/2020   HGB 13.2 07/30/2020   HCT 38.6 (L) 07/30/2020   MCV 92.8 07/30/2020   PLT 254 07/30/2020      Chemistry      Component Value Date/Time   NA 138 07/30/2020 0935   K 4.4 07/30/2020 0935   CL 106 07/30/2020 0935   CO2 28 07/30/2020 0935   BUN 16 07/30/2020 0935   CREATININE 0.95 07/30/2020 0935      Component Value Date/Time   CALCIUM 8.9 07/30/2020 0935   ALKPHOS 131 (H) 07/30/2020 0935   AST 22 07/30/2020 0935   ALT 32 07/30/2020 0935   BILITOT 0.7 07/30/2020 0935       RADIOGRAPHIC STUDIES: CT  Chest W Contrast  Result Date: 07/30/2020 CLINICAL DATA:  Primary Cancer Type: Lung Imaging Indication: Assess response to therapy Interval therapy since last imaging? Yes Initial Cancer Diagnosis Date: 08/23/2018; Established by: Biopsy-proven Detailed Pathology: Stage IIIB non-small cell lung cancer, adenocarcinoma. Primary Tumor location: Right upper lobe mass, mediastinal and bilateral supraclavicular lymphadenopathy. Recurrence? Yes; Date(s) of recurrence: 02/16/2020; Established by: Biopsy-proven Surgeries: Right inguinal hernia repair with mesh 06/24/2020. Chemotherapy: Yes; Ongoing?  Yes, daily [name] Immunotherapy?  Yes; Type: Imfinzi; Ongoing? No Radiation therapy? Yes Date Range: 03/06/2019-04/13/2019; Target: Right neck Date Range: 09/05/2018 - 10/14/2018; Target: Right lung EXAM: CT CHEST, ABDOMEN, AND PELVIS WITH CONTRAST TECHNIQUE: Multidetector CT imaging of the chest was performed during intravenous contrast administration. CONTRAST:  166mL OMNIPAQUE IOHEXOL 300 MG/ML  SOLN COMPARISON:  Most recent CT chest, abdomen and pelvis 05/03/2020. 02/02/2020 PET-CT. FINDINGS: CT CHEST FINDINGS Cardiovascular: Normal heart size. No significant pericardial effusion/thickening. Left anterior descending coronary atherosclerosis. Atherosclerotic nonaneurysmal thoracic aorta. Normal caliber pulmonary arteries. No central pulmonary emboli. Mediastinum/Nodes: No discrete thyroid nodules. Unremarkable esophagus. No pathologically enlarged axillary, mediastinal or hilar lymph nodes. Lungs/Pleura: No pneumothorax. Small to moderate dependent right pleural effusion, stable. No left pleural effusion. No acute consolidative airspace disease, lung masses or new significant pulmonary nodules. Previously visualized 0.7 cm indistinct solid medial basilar left lower lobe pulmonary nodule is decreased in size to 0.4 cm and is decreased in density, now ground-glass (series 4/image 116). Sharply marginated patchy right  perihilar consolidation with associated volume loss, distortion and bronchiectasis, unchanged, compatible with radiation fibrosis. Musculoskeletal: No aggressive appearing focal osseous lesions. Moderate thoracic spondylosis. CT ABDOMEN PELVIS FINDINGS Hepatobiliary: Normal liver with no liver mass. Normal gallbladder with no radiopaque cholelithiasis. No biliary ductal dilatation. Pancreas: Normal, with no mass or duct dilation. Spleen: Normal size. No mass. Adrenals/Urinary Tract: Normal adrenals. Simple 1.3 cm upper left renal cyst. No suspicious renal masses. No hydronephrosis. Normal bladder. Stomach/Bowel: Normal non-distended stomach. Normal caliber small bowel with no small bowel wall thickening. Normal appendix. Oral contrast transits to the pelvic small bowel. Large colonic stool volume with no large bowel wall thickening or significant pericolonic fat stranding. No diverticulosis. Vascular/Lymphatic: Atherosclerotic nonaneurysmal abdominal aorta. Stable position of IVC filter below the level of the renal veins. Patent portal, splenic, hepatic and renal veins. No pathologically enlarged lymph nodes in the abdomen or pelvis. Previously visualized FDG avid upper left retroperitoneal lymph node anterior to the left diaphragmatic crus measures 0.5 cm, nonenlarged, unchanged (series 2/image 57). Reproductive: Normal size prostate. Other: No pneumoperitoneum, ascites or focal fluid collection. Interval repair of fat containing right inguinal hernia with expected postsurgical fat stranding in this location, with no evidence of recurrent right inguinal hernia. Stable small fat containing left inguinal hernia. Musculoskeletal: No aggressive appearing focal osseous lesions. Mild lumbar  spondylosis. IMPRESSION: 1. Previously visualized 0.7 cm medial basilar left lower lobe pulmonary nodule is decreased in size and is decreased in density, now ground-glass, compatible with response to therapy. 2. 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 3. Stable small to moderate dependent right pleural effusion. 4. No new or progressive metastatic disease in the chest, abdomen or pelvis. 5. Large colonic stool volume, suggesting constipation. 6. Expected postsurgical changes from interval right inguinal hernia repair. 7. Aortic Atherosclerosis (ICD10-I70.0). Electronically Signed   By: Ilona Sorrel M.D.   On: 07/30/2020 11:50   CT Abdomen Pelvis W Contrast  Result Date: 07/30/2020 CLINICAL DATA:  Primary Cancer Type: Lung Imaging Indication: Assess response to therapy Interval therapy since last imaging? Yes Initial Cancer Diagnosis Date: 08/23/2018; Established by: Biopsy-proven Detailed Pathology: Stage IIIB non-small cell lung cancer, adenocarcinoma. Primary Tumor location: Right upper lobe mass, mediastinal and bilateral supraclavicular lymphadenopathy. Recurrence? Yes; Date(s) of recurrence: 02/16/2020; Established by: Biopsy-proven Surgeries: Right inguinal hernia repair with mesh 06/24/2020. Chemotherapy: Yes; Ongoing?  Yes, daily [name] Immunotherapy?  Yes; Type: Imfinzi; Ongoing? No Radiation therapy? Yes Date Range: 03/06/2019-04/13/2019; Target: Right neck Date Range: 09/05/2018 - 10/14/2018; Target: Right lung EXAM: CT CHEST, ABDOMEN, AND PELVIS WITH CONTRAST TECHNIQUE: Multidetector CT imaging of the chest was performed during intravenous contrast administration. CONTRAST:  110mL OMNIPAQUE IOHEXOL 300 MG/ML  SOLN COMPARISON:  Most recent CT chest, abdomen and pelvis 05/03/2020. 02/02/2020 PET-CT. FINDINGS: CT CHEST FINDINGS Cardiovascular: Normal heart size. No significant pericardial effusion/thickening. Left anterior descending coronary atherosclerosis. Atherosclerotic nonaneurysmal thoracic aorta. Normal caliber pulmonary arteries. No central pulmonary emboli. Mediastinum/Nodes: No discrete thyroid nodules. Unremarkable esophagus. No  pathologically enlarged axillary, mediastinal or hilar lymph nodes. Lungs/Pleura: No pneumothorax. Small to moderate dependent right pleural effusion, stable. No left pleural effusion. No acute consolidative airspace disease, lung masses or new significant pulmonary nodules. Previously visualized 0.7 cm indistinct solid medial basilar left lower lobe pulmonary nodule is decreased in size to 0.4 cm and is decreased in density, now ground-glass (series 4/image 116). Sharply marginated patchy right perihilar consolidation with associated volume loss, distortion and bronchiectasis, unchanged, compatible with radiation fibrosis. Musculoskeletal: No aggressive appearing focal osseous lesions. Moderate thoracic spondylosis. CT ABDOMEN PELVIS FINDINGS Hepatobiliary: Normal liver with no liver mass. Normal gallbladder with no radiopaque cholelithiasis. No biliary ductal dilatation. Pancreas: Normal, with no mass or duct dilation. Spleen: Normal size. No mass. Adrenals/Urinary Tract: Normal adrenals. Simple 1.3 cm upper left renal cyst. No suspicious renal masses. No hydronephrosis. Normal bladder. Stomach/Bowel: Normal non-distended stomach. Normal caliber small bowel with no small bowel wall thickening. Normal appendix. Oral contrast transits to the pelvic small bowel. Large colonic stool volume with no large bowel wall thickening or significant pericolonic fat stranding. No diverticulosis. Vascular/Lymphatic: Atherosclerotic nonaneurysmal abdominal aorta. Stable position of IVC filter below the level of the renal veins. Patent portal, splenic, hepatic and renal veins. No pathologically enlarged lymph nodes in the abdomen or pelvis. Previously visualized FDG avid upper left retroperitoneal lymph node anterior to the left diaphragmatic crus measures 0.5 cm, nonenlarged, unchanged (series 2/image 57). Reproductive: Normal size prostate. Other: No pneumoperitoneum, ascites or focal fluid collection. Interval repair of fat  containing right inguinal hernia with expected postsurgical fat stranding in this location, with no evidence of recurrent right inguinal hernia. Stable small fat containing left inguinal hernia. Musculoskeletal: No aggressive appearing focal osseous lesions. Mild lumbar spondylosis. IMPRESSION: 1. Previously visualized 0.7 cm medial  basilar left lower lobe pulmonary nodule is decreased in size and is decreased in density, now ground-glass, compatible with response to therapy. 2. 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 3. Stable small to moderate dependent right pleural effusion. 4. No new or progressive metastatic disease in the chest, abdomen or pelvis. 5. Large colonic stool volume, suggesting constipation. 6. Expected postsurgical changes from interval right inguinal hernia repair. 7. Aortic Atherosclerosis (ICD10-I70.0). Electronically Signed   By: Ilona Sorrel M.D.   On: 07/30/2020 11:50    ASSESSMENT AND PLAN: This is a very pleasant 59 years old white male with metastatic non-small cell lung cancer, adenocarcinoma now with ALK gene translocation.  The patient was initially diagnosed as a stage IIIa non-small cell lung cancer status post a course of concurrent chemoradiation with weekly carboplatin and paclitaxel followed by 1 year of consolidation treatment with immunotherapy with Imfinzi. He had evidence for disease progression recently. Repeat tissue biopsy from the left supraclavicular lymph nodes and molecular studies showed that the patient has positive ALK gene translocation.  His previous molecular studies by [employer] 360 was negative. I had a lengthy discussion with the patient today about his condition and treatment options. He received 1 cycle of systemic chemotherapy with carboplatin, Alimta and Keytruda.  We will discontinue his systemic chemotherapy for now because of the new findings on the molecular studies. The  patient is currently on treatment with [name] 600 mg p.o. twice daily.  He is status post 5 months of treatment.   The patient continues to tolerate his treatment well with no concerning adverse effects. He had repeat CT scan of the chest, abdomen pelvis performed recently.  I personally and independently reviewed the scan images and discussed the results with the patient and his wife. His scan showed further improvement of his disease but there was persistent small to moderate right pleural effusion and consolidative changes in the right hilar area from the previous radiation. I recommended for the patient to continue his current treatment with [name] with the same dose. For the pleural effusion, will continue to monitor for now and consider the patient for ultrasound-guided right thoracentesis if it increase in size. For the hypothyroidism, he will continue his current treatment with levothyroxine. I will see the patient back for follow-up visit in 6 weeks for evaluation and repeat blood work. He was advised to call immediately if he has any other concerning symptoms in the interval.  The patient voices understanding of current disease status and treatment options and is in agreement with the current care plan.  All questions were answered. The patient knows to call the clinic with any problems, questions or concerns. We can certainly see the patient much sooner if necessary.  Disclaimer: This note was dictated with voice recognition software. Similar sounding words can inadvertently be transcribed and may not be corrected upon review.

## 2020-08-01 NOTE — Telephone Encounter (Signed)
Scheduled appt per 12/30 los - left message for patient with appt date and time

## 2020-08-06 ENCOUNTER — Ambulatory Visit
Admission: RE | Admit: 2020-08-06 | Discharge: 2020-08-06 | Disposition: A | Discharge status: Home / Self Care | Payer: BC Managed Care – PPO | Source: Ambulatory Visit | Attending: Interventional Radiology | Admitting: Interventional Radiology

## 2020-08-06 ENCOUNTER — Encounter: Payer: Self-pay | Admitting: *Deleted

## 2020-08-06 DIAGNOSIS — I82812 Embolism and thrombosis of superficial veins of left lower extremities: Secondary | ICD-10-CM | POA: Diagnosis not present

## 2020-08-06 DIAGNOSIS — Z7901 Long term (current) use of anticoagulants: Secondary | ICD-10-CM | POA: Diagnosis not present

## 2020-08-06 DIAGNOSIS — Z95828 Presence of other vascular implants and grafts: Secondary | ICD-10-CM

## 2020-08-06 DIAGNOSIS — I82813 Embolism and thrombosis of superficial veins of lower extremities, bilateral: Secondary | ICD-10-CM | POA: Diagnosis not present

## 2020-08-06 DIAGNOSIS — I82432 Acute embolism and thrombosis of left popliteal vein: Secondary | ICD-10-CM | POA: Diagnosis not present

## 2020-08-06 HISTORY — PX: IR RADIOLOGIST EVAL & MGMT: IMG5224

## 2020-08-06 NOTE — Consult Note (Signed)
Chief Complaint:  Recent DVT, IVC filter management  Referring Physician(s): Mohammad  History of Present Illness: Brian [name] is a 60 y.o. male with stage IIIb metastatic non-small cell lung carcinoma. During the summer, he was diagnosed with acute left popliteal DVT and acute bilateral calf DVT despite being on Xarelto. IVC filter was placed 03/04/2020. Since then, he has been closely followed by Dr. Earlie Server and has been on daily Lovenox shots for anticoagulation. He has an excellent functional status and works full-time. He also goes on daily walks with his dog and does light gym workouts with free weights. He works as an Pensions consultant. He currently does not have any DVT symptoms.  Lower extremity ultrasound performed today demonstrates no evidence of significant acute DVT in either extremity. He does have chronic appearing left GSV superficial thrombosis as well as superficial thrombosis of left calf subsurface varicosities.  Review of his recent CT confirms the IVC filter is well-positioned in the infrarenal IVC without signs of migration or tilt. No evidence of thrombus within the filter. All signs are favorable for filter removal.  Past Medical History:  Diagnosis Date  . COPD (chronic obstructive pulmonary disease) ([place])   . Diabetes mellitus without complication ([place])   . Hypertension   . nscl ca dx'd 08/2018  . Pneumonia   . Subarachnoid hemorrhage [facility])     Past Surgical History:  Procedure Laterality Date  . INGUINAL HERNIA REPAIR Right 06/24/2020   Procedure: RIGHT OPEN INGUINAL HERNIA REPAIR WITH MESH;  Surgeon: Kinsinger, Arta Bruce, MD;  Location: [facility];  Service: General;  Laterality: Right;  . IR IVC FILTER PLMT / S&I Burke Keels GUID/MOD SED  03/04/2020  . LEG SURGERY  age 51    left leg, from fracture    Allergies: Patient has no known allergies.  Medications: Prior to Admission medications   Medication Sig Start Date End Date Taking? Authorizing  Provider  albuterol (VENTOLIN HFA) 108 (90 Base) MCG/ACT inhaler Inhale 2 puffs into the lungs every 4 (four) hours as needed for wheezing or shortness of breath. 01/02/20   [name], MD  [name] 150 MG capsule TAKE 4 CAPSULES BY MOUTH TWICE DAILY WITH FOOD. SWALLOW CAPSULES WHOLE. DO NOT OPEN OR CRUSH. STORE IN ORIGINAL CONTAINER. 07/08/20   Curt Bears, MD  amLODipine (NORVASC) 5 MG tablet Take 5 mg by mouth daily.  07/20/18   [provider]  atorvastatin (LIPITOR) 20 MG tablet Take 20 mg by mouth daily.  12/10/17   [provider]  B Complex Vitamins (B COMPLEX PO) Take 1 tablet by mouth daily.    [provider]  benzonatate (TESSALON) 200 MG capsule TAKE ONE CAP 2-3 TIMES A DAY FOR COUGH Patient taking differently: Take 200 mg by mouth 3 (three) times daily as needed for cough. 05/14/20   Heilingoetter, Cassandra L, PA-C  cholecalciferol (VITAMIN D3) 25 MCG (1000 UNIT) tablet Take 1,000 Units by mouth daily.    [provider]  enoxaparin (LOVENOX) 120 MG/0.8ML injection INJECT 0.8 MLS (120 MG TOTAL) INTO THE SKIN DAILY. 05/28/20 06/27/20  Heilingoetter, Cassandra L, PA-C  HYDROcodone-acetaminophen (NORCO/VICODIN) 5-325 MG tablet Take 1 tablet by mouth every 6 (six) hours as needed for moderate pain. 06/24/20   Kinsinger, Arta Bruce, MD  ibuprofen (ADVIL) 800 MG tablet Take 1 tablet (800 mg total) by mouth every 8 (eight) hours as needed. 06/24/20   Kinsinger, Arta Bruce, MD  levothyroxine (SYNTHROID) 100 MCG tablet Take 1 tablet (100 mcg  total) by mouth daily before breakfast. 07/09/20   Curt Bears, MD  metFORMIN (GLUCOPHAGE) 500 MG tablet Take 500 mg by mouth daily with breakfast.  12/10/17   [provider]  Multiple Vitamin (MULTIVITAMIN) tablet Take 1 tablet by mouth daily.    [provider]  prochlorperazine (COMPAZINE) 10 MG tablet Take 1 tablet (10 mg total) by mouth every 6 (six) hours as needed. Patient taking  differently: Take 10 mg by mouth every 6 (six) hours as needed for nausea or vomiting. 05/03/20   Heilingoetter, Cassandra L, PA-C  Respiratory Therapy Supplies (FLUTTER) DEVI Please use up 10 times daily(4-5 breaths, 4-5 times daily) 03/07/19   Lauraine Rinne, NP  TRELEGY ELLIPTA 100-62.5-25 MCG/INH AEPB Inhale 1 puff into the lungs daily. 01/09/20   [provider]  venlafaxine (EFFEXOR) 37.5 MG tablet Take 37.5 mg by mouth daily.  12/10/17   [provider]     Family History  Problem Relation Age of Onset  . Colon cancer Neg Hx   . Stomach cancer Neg Hx     Social History   Socioeconomic History  . Marital status: Married    Spouse name: Not on file  . Number of children: Not on file  . Years of education: Not on file  . Highest education level: Not on file  Occupational History  . Not on file  Tobacco Use  . Smoking status: Never Smoker  . Smokeless tobacco: Never Used  Vaping Use  . Vaping Use: Never used  Substance and Sexual Activity  . Alcohol use: Yes    Alcohol/week: 5.0 standard drinks    Types: 5 Standard drinks or equivalent per week    Comment: occasionally  . Drug use: No  . Sexual activity: Not on file  Other Topics Concern  . Not on file  Social History Narrative  . Not on file   Social Determinants of Health   Financial Resource Strain: Not on file  Food Insecurity: Not on file  Transportation Needs: Not on file  Physical Activity: Not on file  Stress: Not on file  Social Connections: Not on file    ECOG Status: 1 - Symptomatic but completely ambulatory  Review of Systems: A 12 point ROS discussed and pertinent positives are indicated in the HPI above.  All other systems are negative.  Review of Systems  Vital Signs: There were no vitals taken for this visit.  Physical Exam Constitutional:      General: He is not in acute distress.    Appearance: He is not toxic-appearing.  Eyes:     General: No scleral icterus.     Conjunctiva/sclera: Conjunctivae normal.  Cardiovascular:     Rate and Rhythm: Normal rate and regular rhythm.     Heart sounds: No murmur heard.   Pulmonary:     Effort: Pulmonary effort is normal.     Breath sounds: No wheezing.     Comments: Diminished breath sounds on the right due to a chronic effusion Abdominal:     General: Bowel sounds are normal. There is no distension.     Palpations: Abdomen is soft.  Skin:    Coloration: Skin is not jaundiced.  Neurological:     General: No focal deficit present.     Mental Status: Mental status is at baseline.  Psychiatric:        Mood and Affect: Mood normal.     Imaging: CT Chest W Contrast  Result Date: 07/30/2020 CLINICAL DATA:  Primary Cancer Type: Lung Imaging Indication: Assess response to therapy Interval therapy since last imaging? Yes Initial Cancer Diagnosis Date: 08/23/2018; Established by: Biopsy-proven Detailed Pathology: Stage IIIB non-small cell lung cancer, adenocarcinoma. Primary Tumor location: Right upper lobe mass, mediastinal and bilateral supraclavicular lymphadenopathy. Recurrence? Yes; Date(s) of recurrence: 02/16/2020; Established by: Biopsy-proven Surgeries: Right inguinal hernia repair with mesh 06/24/2020. Chemotherapy: Yes; Ongoing?  Yes, daily [name] Immunotherapy?  Yes; Type: Imfinzi; Ongoing? No Radiation therapy? Yes Date Range: 03/06/2019-04/13/2019; Target: Right neck Date Range: 09/05/2018 - 10/14/2018; Target: Right lung EXAM: CT CHEST, ABDOMEN, AND PELVIS WITH CONTRAST TECHNIQUE: Multidetector CT imaging of the chest was performed during intravenous contrast administration. CONTRAST:  120mL OMNIPAQUE IOHEXOL 300 MG/ML  SOLN COMPARISON:  Most recent CT chest, abdomen and pelvis 05/03/2020. 02/02/2020 PET-CT. FINDINGS: CT CHEST FINDINGS Cardiovascular: Normal heart size. No significant pericardial effusion/thickening. Left anterior descending coronary atherosclerosis. Atherosclerotic nonaneurysmal  thoracic aorta. Normal caliber pulmonary arteries. No central pulmonary emboli. Mediastinum/Nodes: No discrete thyroid nodules. Unremarkable esophagus. No pathologically enlarged axillary, mediastinal or hilar lymph nodes. Lungs/Pleura: No pneumothorax. Small to moderate dependent right pleural effusion, stable. No left pleural effusion. No acute consolidative airspace disease, lung masses or new significant pulmonary nodules. Previously visualized 0.7 cm indistinct solid medial basilar left lower lobe pulmonary nodule is decreased in size to 0.4 cm and is decreased in density, now ground-glass (series 4/image 116). Sharply marginated patchy right perihilar consolidation with associated volume loss, distortion and bronchiectasis, unchanged, compatible with radiation fibrosis. Musculoskeletal: No aggressive appearing focal osseous lesions. Moderate thoracic spondylosis. CT ABDOMEN PELVIS FINDINGS Hepatobiliary: Normal liver with no liver mass. Normal gallbladder with no radiopaque cholelithiasis. No biliary ductal dilatation. Pancreas: Normal, with no mass or duct dilation. Spleen: Normal size. No mass. Adrenals/Urinary Tract: Normal adrenals. Simple 1.3 cm upper left renal cyst. No suspicious renal masses. No hydronephrosis. Normal bladder. Stomach/Bowel: Normal non-distended stomach. Normal caliber small bowel with no small bowel wall thickening. Normal appendix. Oral contrast transits to the pelvic small bowel. Large colonic stool volume with no large bowel wall thickening or significant pericolonic fat stranding. No diverticulosis. Vascular/Lymphatic: Atherosclerotic nonaneurysmal abdominal aorta. Stable position of IVC filter below the level of the renal veins. Patent portal, splenic, hepatic and renal veins. No pathologically enlarged lymph nodes in the abdomen or pelvis. Previously visualized FDG avid upper left retroperitoneal lymph node anterior to the left diaphragmatic crus measures 0.5 cm, nonenlarged,  unchanged (series 2/image 57). Reproductive: Normal size prostate. Other: No pneumoperitoneum, ascites or focal fluid collection. Interval repair of fat containing right inguinal hernia with expected postsurgical fat stranding in this location, with no evidence of recurrent right inguinal hernia. Stable small fat containing left inguinal hernia. Musculoskeletal: No aggressive appearing focal osseous lesions. Mild lumbar spondylosis. IMPRESSION: 1. Previously visualized 0.7 cm medial basilar left lower lobe pulmonary nodule is decreased in size and is decreased in density, now ground-glass, compatible with response to therapy. 2. 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 3. Stable small to moderate dependent right pleural effusion. 4. No new or progressive metastatic disease in the chest, abdomen or pelvis. 5. Large colonic stool volume, suggesting constipation. 6. Expected postsurgical changes from interval right inguinal hernia repair. 7. Aortic Atherosclerosis (ICD10-I70.0). Electronically Signed   By: Ilona Sorrel M.D.   On: 07/30/2020 11:50   CT Abdomen Pelvis W Contrast  Result Date: 07/30/2020 CLINICAL DATA:  Primary Cancer Type: Lung Imaging Indication: Assess response to  therapy Interval therapy since last imaging? Yes Initial Cancer Diagnosis Date: 08/23/2018; Established by: Biopsy-proven Detailed Pathology: Stage IIIB non-small cell lung cancer, adenocarcinoma. Primary Tumor location: Right upper lobe mass, mediastinal and bilateral supraclavicular lymphadenopathy. Recurrence? Yes; Date(s) of recurrence: 02/16/2020; Established by: Biopsy-proven Surgeries: Right inguinal hernia repair with mesh 06/24/2020. Chemotherapy: Yes; Ongoing?  Yes, daily [name] Immunotherapy?  Yes; Type: Imfinzi; Ongoing? No Radiation therapy? Yes Date Range: 03/06/2019-04/13/2019; Target: Right neck Date Range: 09/05/2018 - 10/14/2018; Target: Right  lung EXAM: CT CHEST, ABDOMEN, AND PELVIS WITH CONTRAST TECHNIQUE: Multidetector CT imaging of the chest was performed during intravenous contrast administration. CONTRAST:  198mL OMNIPAQUE IOHEXOL 300 MG/ML  SOLN COMPARISON:  Most recent CT chest, abdomen and pelvis 05/03/2020. 02/02/2020 PET-CT. FINDINGS: CT CHEST FINDINGS Cardiovascular: Normal heart size. No significant pericardial effusion/thickening. Left anterior descending coronary atherosclerosis. Atherosclerotic nonaneurysmal thoracic aorta. Normal caliber pulmonary arteries. No central pulmonary emboli. Mediastinum/Nodes: No discrete thyroid nodules. Unremarkable esophagus. No pathologically enlarged axillary, mediastinal or hilar lymph nodes. Lungs/Pleura: No pneumothorax. Small to moderate dependent right pleural effusion, stable. No left pleural effusion. No acute consolidative airspace disease, lung masses or new significant pulmonary nodules. Previously visualized 0.7 cm indistinct solid medial basilar left lower lobe pulmonary nodule is decreased in size to 0.4 cm and is decreased in density, now ground-glass (series 4/image 116). Sharply marginated patchy right perihilar consolidation with associated volume loss, distortion and bronchiectasis, unchanged, compatible with radiation fibrosis. Musculoskeletal: No aggressive appearing focal osseous lesions. Moderate thoracic spondylosis. CT ABDOMEN PELVIS FINDINGS Hepatobiliary: Normal liver with no liver mass. Normal gallbladder with no radiopaque cholelithiasis. No biliary ductal dilatation. Pancreas: Normal, with no mass or duct dilation. Spleen: Normal size. No mass. Adrenals/Urinary Tract: Normal adrenals. Simple 1.3 cm upper left renal cyst. No suspicious renal masses. No hydronephrosis. Normal bladder. Stomach/Bowel: Normal non-distended stomach. Normal caliber small bowel with no small bowel wall thickening. Normal appendix. Oral contrast transits to the pelvic small bowel. Large colonic stool  volume with no large bowel wall thickening or significant pericolonic fat stranding. No diverticulosis. Vascular/Lymphatic: Atherosclerotic nonaneurysmal abdominal aorta. Stable position of IVC filter below the level of the renal veins. Patent portal, splenic, hepatic and renal veins. No pathologically enlarged lymph nodes in the abdomen or pelvis. Previously visualized FDG avid upper left retroperitoneal lymph node anterior to the left diaphragmatic crus measures 0.5 cm, nonenlarged, unchanged (series 2/image 57). Reproductive: Normal size prostate. Other: No pneumoperitoneum, ascites or focal fluid collection. Interval repair of fat containing right inguinal hernia with expected postsurgical fat stranding in this location, with no evidence of recurrent right inguinal hernia. Stable small fat containing left inguinal hernia. Musculoskeletal: No aggressive appearing focal osseous lesions. Mild lumbar spondylosis. IMPRESSION: 1. Previously visualized 0.7 cm medial basilar left lower lobe pulmonary nodule is decreased in size and is decreased in density, now ground-glass, compatible with response to therapy. 2. 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 3. Stable small to moderate dependent right pleural effusion. 4. No new or progressive metastatic disease in the chest, abdomen or pelvis. 5. Large colonic stool volume, suggesting constipation. 6. Expected postsurgical changes from interval right inguinal hernia repair. 7. Aortic Atherosclerosis (ICD10-I70.0). Electronically Signed   By: Ilona Sorrel M.D.   On: 07/30/2020 11:50   US Venous Img Lower Bilateral (DVT)  Result Date: 08/06/2020 CLINICAL DATA:  Recent left popliteal and bilateral calf acute DVT 5 months ago, status post filter placement. EXAM: BILATERAL  LOWER EXTREMITY VENOUS DOPPLER ULTRASOUND TECHNIQUE: Gray-scale sonography with graded compression, as well as color Doppler and  duplex ultrasound were performed to evaluate the lower extremity deep venous systems from the level of the common femoral vein and including the common femoral, femoral, profunda femoral, popliteal and calf veins including the posterior tibial, peroneal and gastrocnemius veins when visible. The superficial great saphenous vein was also interrogated. Spectral Doppler was utilized to evaluate flow at rest and with distal augmentation maneuvers in the common femoral, femoral and popliteal veins. COMPARISON:  None. FINDINGS: RIGHT LOWER EXTREMITY Common Femoral Vein: No evidence of thrombus. Normal compressibility, respiratory phasicity and response to augmentation. Saphenofemoral Junction: No evidence of thrombus. Normal compressibility and flow on color Doppler imaging. Profunda Femoral Vein: No evidence of thrombus. Normal compressibility and flow on color Doppler imaging. Femoral Vein: No evidence of thrombus. Normal compressibility, respiratory phasicity and response to augmentation. Popliteal Vein: No evidence of thrombus. Normal compressibility, respiratory phasicity and response to augmentation. Calf Veins: No evidence of thrombus. Normal compressibility and flow on color Doppler imaging. Superficial Great Saphenous Vein: No evidence of thrombus. Normal compressibility. LEFT LOWER EXTREMITY Common Femoral Vein: No evidence of thrombus. Normal compressibility, respiratory phasicity and response to augmentation. Saphenofemoral Junction: No evidence of thrombus. Normal compressibility and flow on color Doppler imaging. Profunda Femoral Vein: No evidence of thrombus. Normal compressibility and flow on color Doppler imaging. Femoral Vein: No evidence of thrombus. Normal compressibility, respiratory phasicity and response to augmentation. Popliteal Vein: No evidence of thrombus. Normal compressibility, respiratory phasicity and response to augmentation. Calf Veins: No evidence of thrombus. Normal compressibility and  flow on color Doppler imaging. Superficial Great Saphenous Vein: Chronic appearing thrombosis of the left GSV throughout the thigh into the proximal calf. There also thrombosed proximal calf sub surface varicosities. IMPRESSION: Negative for significant DVT in either lower extremity. 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 Electronically Signed   By: [name].  Jametta Moorehead M.D.   On: 08/06/2020 10:55    Labs:  CBC: Recent Labs    06/05/20 1016 06/21/20 0819 07/04/20 0905 07/30/20 0935  WBC 12.0* 14.7* 13.2* 10.3  HGB 12.9* 13.6 13.2 13.2  HCT 38.7* 40.0 39.0 38.6*  PLT 384 293 284 254    COAGS: Recent Labs    11/26/19 1319  INR 1.1    BMP: Recent Labs    03/06/20 1130 03/19/20 0840 04/03/20 1145 05/03/20 1515 05/18/20 0833 06/05/20 1016 06/21/20 0819 07/04/20 0905 07/30/20 0935  NA 138 136 139 137   < > 139 137 139 138  K 4.0 3.7 3.8 3.8   < > 4.0 4.0 3.9 4.4  CL 105 106 108 105   < > 107 103 108 106  CO2 23 24 25 25    < > 25 25 21* 28  GLUCOSE 101* 151* 83 88   < > 153* 93 104* 87  BUN 11 13 14 13    < > 15 18 23* 16  CALCIUM 9.3 9.2 9.2 8.9   < > 8.7* 8.7* 9.1 8.9  CREATININE 0.82 0.93 0.82 0.99   < > 0.93 0.84 0.91 0.95  GFRNONAA >60 >60 >60 >60   < > >60 >60 >60 >60  GFRAA >60 >60 >60 >60  --   --   --   --   --    < > = values in this interval not displayed.    LIVER FUNCTION TESTS: Recent Labs    05/18/20 7824 06/05/20 1016 07/04/20  2620 07/30/20 0935  BILITOT 0.8 0.7 0.8 0.7  AST 36 28 18 22   ALT 31 26 29  32  ALKPHOS 179* 210* 129* 131*  PROT 5.9* 6.1* 6.2* 6.2*  ALBUMIN 3.2* 3.1* 3.3* 3.4*     Assessment and Plan:  35-month status post IVC filter placement. Repeat outpatient ultrasound today demonstrates no evidence of acute DVT in either lower extremity. He remains on daily Lovenox shots for anticoagulation. Excellent functional status.  IVC filter removal was discussed in detail. Including the procedure, risk, benefits  and alternatives. I think at this point, the filter can be removed. All questions were addressed. He is in agreement with this plan. Dr. Julien Nordmann to manage his long-term anticoagulation.  Plan: Scheduled for elective IVC filter removal.  Thank you for this interesting consult.  I greatly enjoyed meeting Brian [name] and look forward to participating in their care.  A copy of this report was sent to the requesting provider on this date.  Electronically Signed: Greggory Keen, MD 08/06/2020, 10:59 AM   I spent a total of  40 Minutes   in face to face in clinical consultation, greater than 50% of which was counseling/coordinating care for this patient with an IVC filter

## 2020-08-07 ENCOUNTER — Other Ambulatory Visit (HOSPITAL_COMMUNITY): Payer: Self-pay | Admitting: Interventional Radiology

## 2020-08-07 DIAGNOSIS — Z95828 Presence of other vascular implants and grafts: Secondary | ICD-10-CM

## 2020-08-14 ENCOUNTER — Other Ambulatory Visit: Payer: Self-pay | Admitting: Student

## 2020-08-14 ENCOUNTER — Other Ambulatory Visit: Payer: Self-pay | Admitting: Radiology

## 2020-08-15 ENCOUNTER — Ambulatory Visit (HOSPITAL_COMMUNITY)
Admission: RE | Admit: 2020-08-15 | Discharge: 2020-08-15 | Disposition: A | Discharge status: Home / Self Care | Payer: BC Managed Care – PPO | Source: Ambulatory Visit | Attending: Interventional Radiology | Admitting: Interventional Radiology

## 2020-08-15 ENCOUNTER — Encounter (HOSPITAL_COMMUNITY): Payer: Self-pay

## 2020-08-15 ENCOUNTER — Other Ambulatory Visit: Payer: Self-pay

## 2020-08-15 ENCOUNTER — Other Ambulatory Visit (HOSPITAL_COMMUNITY): Payer: Self-pay | Admitting: Interventional Radiology

## 2020-08-15 DIAGNOSIS — Z86718 Personal history of other venous thrombosis and embolism: Secondary | ICD-10-CM | POA: Diagnosis not present

## 2020-08-15 DIAGNOSIS — Z95828 Presence of other vascular implants and grafts: Secondary | ICD-10-CM

## 2020-08-15 DIAGNOSIS — Z452 Encounter for adjustment and management of vascular access device: Secondary | ICD-10-CM | POA: Diagnosis not present

## 2020-08-15 DIAGNOSIS — Z4589 Encounter for adjustment and management of other implanted devices: Secondary | ICD-10-CM | POA: Diagnosis not present

## 2020-08-15 DIAGNOSIS — Z7984 Long term (current) use of oral hypoglycemic drugs: Secondary | ICD-10-CM | POA: Diagnosis not present

## 2020-08-15 DIAGNOSIS — Z79899 Other long term (current) drug therapy: Secondary | ICD-10-CM | POA: Insufficient documentation

## 2020-08-15 DIAGNOSIS — Z79890 Hormone replacement therapy (postmenopausal): Secondary | ICD-10-CM | POA: Insufficient documentation

## 2020-08-15 DIAGNOSIS — Z7901 Long term (current) use of anticoagulants: Secondary | ICD-10-CM | POA: Diagnosis not present

## 2020-08-15 HISTORY — PX: IR IVC FILTER RETRIEVAL / S&I /IMG GUID/MOD SED: IMG5308

## 2020-08-15 HISTORY — PX: IR US GUIDE VASC ACCESS RIGHT: IMG2390

## 2020-08-15 LAB — GLUCOSE, CAPILLARY: Glucose-Capillary: 99 mg/dL (ref 70–99)

## 2020-08-15 MED ORDER — HYDROCODONE-ACETAMINOPHEN 5-325 MG PO TABS: 1.0000 | ORAL_TABLET | ORAL | Status: DC | PRN

## 2020-08-15 MED ORDER — LIDOCAINE HCL 1 % IJ SOLN
INTRAMUSCULAR | Status: AC
  Filled 2020-08-15: qty 20

## 2020-08-15 MED ORDER — SODIUM CHLORIDE 0.9 % IV SOLN: INTRAVENOUS | Status: DC

## 2020-08-15 MED ORDER — FENTANYL CITRATE (PF) 100 MCG/2ML IJ SOLN
INTRAMUSCULAR | Status: AC | PRN
  Administered 2020-08-15: 50 ug via INTRAVENOUS

## 2020-08-15 MED ORDER — FENTANYL CITRATE (PF) 100 MCG/2ML IJ SOLN
INTRAMUSCULAR | Status: AC
  Filled 2020-08-15: qty 2

## 2020-08-15 MED ORDER — SODIUM CHLORIDE 0.9 % IV SOLN
INTRAVENOUS | Status: AC | PRN
  Administered 2020-08-15: 10 mL/h via INTRAVENOUS

## 2020-08-15 MED ORDER — IOHEXOL 300 MG/ML  SOLN
100.0000 mL | Freq: Once | INTRAMUSCULAR | Status: AC | PRN
  Administered 2020-08-15: 43 mL via INTRAVENOUS

## 2020-08-15 MED ORDER — IOHEXOL 300 MG/ML  SOLN
50.0000 mL | Freq: Once | INTRAMUSCULAR | Status: AC | PRN
  Administered 2020-08-15: 20 mL via INTRAVENOUS

## 2020-08-15 MED ORDER — MIDAZOLAM HCL 2 MG/2ML IJ SOLN
INTRAMUSCULAR | Status: AC | PRN
  Administered 2020-08-15: 1 mg via INTRAVENOUS

## 2020-08-15 MED ORDER — LIDOCAINE HCL (PF) 1 % IJ SOLN
INTRAMUSCULAR | Status: AC | PRN
  Administered 2020-08-15: 5 mL

## 2020-08-15 MED ORDER — MIDAZOLAM HCL 2 MG/2ML IJ SOLN
INTRAMUSCULAR | Status: AC
  Filled 2020-08-15: qty 2

## 2020-08-15 NOTE — H&P (Signed)
Chief Complaint: Patient was seen in consultation today for Inferior vena cava filter removal   Referring Physician(s): Dr Mayme Genta  Supervising Physician: Daryll Brod  Patient Status: [name] - Out-pt  History of Present Illness: Brian [name] is a 60 y.o. male   Hx [facility] Developed LLE DVT and B calf DVT while on Xarelt IVC filter placed 03/2020 Using Lovenox Inj daily per Dr Julien Nordmann Denies any pain; swelling in legs Exercising regularly  Doppler 08/06/20: IMPRESSION: Negative for significant DVT in either lower extremity. 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  Scheduled today for IVC filter retrieval Consult with Dr Annamaria Boots 08/06/20   Past Medical History:  Diagnosis Date  . COPD (chronic obstructive pulmonary disease) ([place])   . Diabetes mellitus without complication ([place])   . Hypertension   . nscl ca dx'd 08/2018  . Pneumonia   . Subarachnoid hemorrhage [facility])     Past Surgical History:  Procedure Laterality Date  . INGUINAL HERNIA REPAIR Right 06/24/2020   Procedure: RIGHT OPEN INGUINAL HERNIA REPAIR WITH MESH;  Surgeon: Kinsinger, Arta Bruce, MD;  Location: [facility] ORS;  Service: General;  Laterality: Right;  . IR IVC FILTER PLMT / S&I Burke Keels GUID/MOD SED  03/04/2020  . IR RADIOLOGIST EVAL & MGMT  08/06/2020  . LEG SURGERY  age 63    left leg, from fracture    Allergies: Patient has no known allergies.  Medications: Prior to Admission medications   Medication Sig Start Date End Date Taking? Authorizing Provider  [name] 150 MG capsule TAKE 4 CAPSULES BY MOUTH TWICE DAILY WITH FOOD. SWALLOW CAPSULES WHOLE. DO NOT OPEN OR CRUSH. STORE IN ORIGINAL CONTAINER. Patient taking differently: Take 600 mg by mouth 2 (two) times daily with a meal. 07/08/20  Yes Curt Bears, MD  amLODipine (NORVASC) 5 MG tablet Take 5 mg by mouth daily.  07/20/18  Yes [provider]  atorvastatin (LIPITOR) 20 MG tablet Take 20 mg by mouth  daily.  12/10/17  Yes [provider]  B Complex Vitamins (B COMPLEX PO) Take 1 tablet by mouth daily.   Yes [provider]  enoxaparin (LOVENOX) 120 MG/0.8ML injection INJECT 0.8 MLS (120 MG TOTAL) INTO THE SKIN DAILY. 05/28/20 06/27/20 Yes Heilingoetter, Cassandra L, PA-C  levothyroxine (SYNTHROID) 100 MCG tablet Take 1 tablet (100 mcg total) by mouth daily before breakfast. 07/09/20  Yes Curt Bears, MD  magnesium hydroxide (MILK OF MAGNESIA) 400 MG/5ML suspension Take 15 mLs by mouth daily as needed for mild constipation.   Yes [provider]  metFORMIN (GLUCOPHAGE) 500 MG tablet Take 500 mg by mouth daily with breakfast.  12/10/17  Yes [provider]  Multiple Vitamin (MULTIVITAMIN) tablet Take 1 tablet by mouth daily.   Yes [provider]  TRELEGY ELLIPTA 100-62.5-25 MCG/INH AEPB Inhale 1 puff into the lungs daily. 01/09/20  Yes [provider]  venlafaxine (EFFEXOR) 37.5 MG tablet Take 37.5 mg by mouth daily.  12/10/17  Yes [provider]  albuterol (VENTOLIN HFA) 108 (90 Base) MCG/ACT inhaler Inhale 2 puffs into the lungs every 4 (four) hours as needed for wheezing or shortness of breath. 01/02/20   [name], MD  benzonatate (TESSALON) 200 MG capsule TAKE ONE CAP 2-3 TIMES A DAY FOR COUGH Patient not taking: No sig reported 05/14/20   Heilingoetter, Cassandra L, PA-C  HYDROcodone-acetaminophen (NORCO/VICODIN) 5-325 MG tablet Take 1 tablet by mouth every 6 (six) hours as needed for moderate pain.  Patient not taking: No sig reported 06/24/20   Kinsinger, Arta Bruce, MD  ibuprofen (ADVIL) 800 MG tablet Take 1 tablet (800 mg total) by mouth every 8 (eight) hours as needed. Patient not taking: No sig reported 06/24/20   Kinsinger, Arta Bruce, MD  prochlorperazine (COMPAZINE) 10 MG tablet Take 1 tablet (10 mg total) by mouth every 6 (six) hours as needed. Patient not taking: No sig reported 05/03/20   Heilingoetter, Cassandra L,  PA-C  Respiratory Therapy Supplies (FLUTTER) DEVI Please use up 10 times daily(4-5 breaths, 4-5 times daily) 03/07/19   Lauraine Rinne, NP     Family History  Problem Relation Age of Onset  . Colon cancer Neg Hx   . Stomach cancer Neg Hx     Social History   Socioeconomic History  . Marital status: Married    Spouse name: Not on file  . Number of children: Not on file  . Years of education: Not on file  . Highest education level: Not on file  Occupational History  . Not on file  Tobacco Use  . Smoking status: Never Smoker  . Smokeless tobacco: Never Used  Vaping Use  . Vaping Use: Never used  Substance and Sexual Activity  . Alcohol use: Yes    Alcohol/week: 5.0 standard drinks    Types: 5 Standard drinks or equivalent per week    Comment: occasionally  . Drug use: No  . Sexual activity: Not on file  Other Topics Concern  . Not on file  Social History Narrative  . Not on file   Social Determinants of Health   Financial Resource Strain: Not on file  Food Insecurity: Not on file  Transportation Needs: Not on file  Physical Activity: Not on file  Stress: Not on file  Social Connections: Not on file     Review of Systems: A 12 point ROS discussed and pertinent positives are indicated in the HPI above.  All other systems are negative.  Review of Systems  Constitutional: Negative for fever.  Respiratory: Negative for cough and shortness of breath.   Cardiovascular: Negative for chest pain and leg swelling.  Gastrointestinal: Negative for nausea.  Musculoskeletal: Negative for gait problem.  Neurological: Negative for weakness.  Psychiatric/Behavioral: Negative for behavioral problems and confusion.    Vital Signs: BP 107/85   Pulse 70   Temp 97.9 F (36.6 C)   Ht 5\' 8"  (1.727 m)   Wt 165 lb (74.8 kg)   SpO2 98%   BMI 25.09 kg/m   Physical Exam Vitals reviewed.  HENT:     Mouth/Throat:     Mouth: Mucous membranes are moist.  Cardiovascular:     Rate  and Rhythm: Normal rate and regular rhythm.     Heart sounds: Normal heart sounds.  Pulmonary:     Effort: Pulmonary effort is normal.     Breath sounds: Normal breath sounds.  Abdominal:     Palpations: Abdomen is soft.  Musculoskeletal:        General: Normal range of motion.  Skin:    General: Skin is warm.  Neurological:     Mental Status: He is alert and oriented to person, place, and time.  Psychiatric:        Behavior: Behavior normal.     Imaging: CT Chest W Contrast  Result Date: 07/30/2020 CLINICAL DATA:  Primary Cancer Type: Lung Imaging Indication: Assess response to therapy Interval therapy since last imaging? Yes Initial Cancer Diagnosis Date: 08/23/2018; Established  by: Biopsy-proven Detailed Pathology: Stage IIIB non-small cell lung cancer, adenocarcinoma. Primary Tumor location: Right upper lobe mass, mediastinal and bilateral supraclavicular lymphadenopathy. Recurrence? Yes; Date(s) of recurrence: 02/16/2020; Established by: Biopsy-proven Surgeries: Right inguinal hernia repair with mesh 06/24/2020. Chemotherapy: Yes; Ongoing?  Yes, daily [name] Immunotherapy?  Yes; Type: Imfinzi; Ongoing? No Radiation therapy? Yes Date Range: 03/06/2019-04/13/2019; Target: Right neck Date Range: 09/05/2018 - 10/14/2018; Target: Right lung EXAM: CT CHEST, ABDOMEN, AND PELVIS WITH CONTRAST TECHNIQUE: Multidetector CT imaging of the chest was performed during intravenous contrast administration. CONTRAST:  16mL OMNIPAQUE IOHEXOL 300 MG/ML  SOLN COMPARISON:  Most recent CT chest, abdomen and pelvis 05/03/2020. 02/02/2020 PET-CT. FINDINGS: CT CHEST FINDINGS Cardiovascular: Normal heart size. No significant pericardial effusion/thickening. Left anterior descending coronary atherosclerosis. Atherosclerotic nonaneurysmal thoracic aorta. Normal caliber pulmonary arteries. No central pulmonary emboli. Mediastinum/Nodes: No discrete thyroid nodules. Unremarkable esophagus. No pathologically enlarged  axillary, mediastinal or hilar lymph nodes. Lungs/Pleura: No pneumothorax. Small to moderate dependent right pleural effusion, stable. No left pleural effusion. No acute consolidative airspace disease, lung masses or new significant pulmonary nodules. Previously visualized 0.7 cm indistinct solid medial basilar left lower lobe pulmonary nodule is decreased in size to 0.4 cm and is decreased in density, now ground-glass (series 4/image 116). Sharply marginated patchy right perihilar consolidation with associated volume loss, distortion and bronchiectasis, unchanged, compatible with radiation fibrosis. Musculoskeletal: No aggressive appearing focal osseous lesions. Moderate thoracic spondylosis. CT ABDOMEN PELVIS FINDINGS Hepatobiliary: Normal liver with no liver mass. Normal gallbladder with no radiopaque cholelithiasis. No biliary ductal dilatation. Pancreas: Normal, with no mass or duct dilation. Spleen: Normal size. No mass. Adrenals/Urinary Tract: Normal adrenals. Simple 1.3 cm upper left renal cyst. No suspicious renal masses. No hydronephrosis. Normal bladder. Stomach/Bowel: Normal non-distended stomach. Normal caliber small bowel with no small bowel wall thickening. Normal appendix. Oral contrast transits to the pelvic small bowel. Large colonic stool volume with no large bowel wall thickening or significant pericolonic fat stranding. No diverticulosis. Vascular/Lymphatic: Atherosclerotic nonaneurysmal abdominal aorta. Stable position of IVC filter below the level of the renal veins. Patent portal, splenic, hepatic and renal veins. No pathologically enlarged lymph nodes in the abdomen or pelvis. Previously visualized FDG avid upper left retroperitoneal lymph node anterior to the left diaphragmatic crus measures 0.5 cm, nonenlarged, unchanged (series 2/image 57). Reproductive: Normal size prostate. Other: No pneumoperitoneum, ascites or focal fluid collection. Interval repair of fat containing right inguinal  hernia with expected postsurgical fat stranding in this location, with no evidence of recurrent right inguinal hernia. Stable small fat containing left inguinal hernia. Musculoskeletal: No aggressive appearing focal osseous lesions. Mild lumbar spondylosis. IMPRESSION: 1. Previously visualized 0.7 cm medial basilar left lower lobe pulmonary nodule is decreased in size and is decreased in density, now ground-glass, compatible with response to therapy. 2. 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 3. Stable small to moderate dependent right pleural effusion. 4. No new or progressive metastatic disease in the chest, abdomen or pelvis. 5. Large colonic stool volume, suggesting constipation. 6. Expected postsurgical changes from interval right inguinal hernia repair. 7. Aortic Atherosclerosis (ICD10-I70.0). Electronically Signed   By: Ilona Sorrel M.D.   On: 07/30/2020 11:50   CT Abdomen Pelvis W Contrast  Result Date: 07/30/2020 CLINICAL DATA:  Primary Cancer Type: Lung Imaging Indication: Assess response to therapy Interval therapy since last imaging? Yes Initial Cancer Diagnosis Date: 08/23/2018; Established by: Biopsy-proven Detailed Pathology: Stage IIIB non-small cell lung  cancer, adenocarcinoma. Primary Tumor location: Right upper lobe mass, mediastinal and bilateral supraclavicular lymphadenopathy. Recurrence? Yes; Date(s) of recurrence: 02/16/2020; Established by: Biopsy-proven Surgeries: Right inguinal hernia repair with mesh 06/24/2020. Chemotherapy: Yes; Ongoing?  Yes, daily [name] Immunotherapy?  Yes; Type: Imfinzi; Ongoing? No Radiation therapy? Yes Date Range: 03/06/2019-04/13/2019; Target: Right neck Date Range: 09/05/2018 - 10/14/2018; Target: Right lung EXAM: CT CHEST, ABDOMEN, AND PELVIS WITH CONTRAST TECHNIQUE: Multidetector CT imaging of the chest was performed during intravenous contrast administration. CONTRAST:  115mL  OMNIPAQUE IOHEXOL 300 MG/ML  SOLN COMPARISON:  Most recent CT chest, abdomen and pelvis 05/03/2020. 02/02/2020 PET-CT. FINDINGS: CT CHEST FINDINGS Cardiovascular: Normal heart size. No significant pericardial effusion/thickening. Left anterior descending coronary atherosclerosis. Atherosclerotic nonaneurysmal thoracic aorta. Normal caliber pulmonary arteries. No central pulmonary emboli. Mediastinum/Nodes: No discrete thyroid nodules. Unremarkable esophagus. No pathologically enlarged axillary, mediastinal or hilar lymph nodes. Lungs/Pleura: No pneumothorax. Small to moderate dependent right pleural effusion, stable. No left pleural effusion. No acute consolidative airspace disease, lung masses or new significant pulmonary nodules. Previously visualized 0.7 cm indistinct solid medial basilar left lower lobe pulmonary nodule is decreased in size to 0.4 cm and is decreased in density, now ground-glass (series 4/image 116). Sharply marginated patchy right perihilar consolidation with associated volume loss, distortion and bronchiectasis, unchanged, compatible with radiation fibrosis. Musculoskeletal: No aggressive appearing focal osseous lesions. Moderate thoracic spondylosis. CT ABDOMEN PELVIS FINDINGS Hepatobiliary: Normal liver with no liver mass. Normal gallbladder with no radiopaque cholelithiasis. No biliary ductal dilatation. Pancreas: Normal, with no mass or duct dilation. Spleen: Normal size. No mass. Adrenals/Urinary Tract: Normal adrenals. Simple 1.3 cm upper left renal cyst. No suspicious renal masses. No hydronephrosis. Normal bladder. Stomach/Bowel: Normal non-distended stomach. Normal caliber small bowel with no small bowel wall thickening. Normal appendix. Oral contrast transits to the pelvic small bowel. Large colonic stool volume with no large bowel wall thickening or significant pericolonic fat stranding. No diverticulosis. Vascular/Lymphatic: Atherosclerotic nonaneurysmal abdominal aorta. Stable  position of IVC filter below the level of the renal veins. Patent portal, splenic, hepatic and renal veins. No pathologically enlarged lymph nodes in the abdomen or pelvis. Previously visualized FDG avid upper left retroperitoneal lymph node anterior to the left diaphragmatic crus measures 0.5 cm, nonenlarged, unchanged (series 2/image 57). Reproductive: Normal size prostate. Other: No pneumoperitoneum, ascites or focal fluid collection. Interval repair of fat containing right inguinal hernia with expected postsurgical fat stranding in this location, with no evidence of recurrent right inguinal hernia. Stable small fat containing left inguinal hernia. Musculoskeletal: No aggressive appearing focal osseous lesions. Mild lumbar spondylosis. IMPRESSION: 1. Previously visualized 0.7 cm medial basilar left lower lobe pulmonary nodule is decreased in size and is decreased in density, now ground-glass, compatible with response to therapy. 2. 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 3. Stable small to moderate dependent right pleural effusion. 4. No new or progressive metastatic disease in the chest, abdomen or pelvis. 5. Large colonic stool volume, suggesting constipation. 6. Expected postsurgical changes from interval right inguinal hernia repair. 7. Aortic Atherosclerosis (ICD10-I70.0). Electronically Signed   By: Ilona Sorrel M.D.   On: 07/30/2020 11:50   US Venous Img Lower Bilateral (DVT)  Result Date: 08/06/2020 CLINICAL DATA:  Recent left popliteal and bilateral calf acute DVT 5 months ago, status post filter placement. EXAM: BILATERAL LOWER EXTREMITY VENOUS DOPPLER ULTRASOUND TECHNIQUE: Gray-scale sonography with graded compression, as well as color Doppler and duplex ultrasound were performed to  evaluate the lower extremity deep venous systems from the level of the common femoral vein and including the common femoral, femoral, profunda  femoral, popliteal and calf veins including the posterior tibial, peroneal and gastrocnemius veins when visible. The superficial great saphenous vein was also interrogated. Spectral Doppler was utilized to evaluate flow at rest and with distal augmentation maneuvers in the common femoral, femoral and popliteal veins. COMPARISON:  None. FINDINGS: RIGHT LOWER EXTREMITY Common Femoral Vein: No evidence of thrombus. Normal compressibility, respiratory phasicity and response to augmentation. Saphenofemoral Junction: No evidence of thrombus. Normal compressibility and flow on color Doppler imaging. Profunda Femoral Vein: No evidence of thrombus. Normal compressibility and flow on color Doppler imaging. Femoral Vein: No evidence of thrombus. Normal compressibility, respiratory phasicity and response to augmentation. Popliteal Vein: No evidence of thrombus. Normal compressibility, respiratory phasicity and response to augmentation. Calf Veins: No evidence of thrombus. Normal compressibility and flow on color Doppler imaging. Superficial Great Saphenous Vein: No evidence of thrombus. Normal compressibility. LEFT LOWER EXTREMITY Common Femoral Vein: No evidence of thrombus. Normal compressibility, respiratory phasicity and response to augmentation. Saphenofemoral Junction: No evidence of thrombus. Normal compressibility and flow on color Doppler imaging. Profunda Femoral Vein: No evidence of thrombus. Normal compressibility and flow on color Doppler imaging. Femoral Vein: No evidence of thrombus. Normal compressibility, respiratory phasicity and response to augmentation. Popliteal Vein: No evidence of thrombus. Normal compressibility, respiratory phasicity and response to augmentation. Calf Veins: No evidence of thrombus. Normal compressibility and flow on color Doppler imaging. Superficial Great Saphenous Vein: Chronic appearing thrombosis of the left GSV throughout the thigh into the proximal calf. There also thrombosed  proximal calf sub surface varicosities. IMPRESSION: Negative for significant DVT in either lower extremity. 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 Electronically Signed   By: [name].  Shick M.D.   On: 08/06/2020 10:55   IR Radiologist Eval & Mgmt  Result Date: 08/06/2020 Please refer to notes tab for details about interventional procedure. (Op Note)   Labs:  CBC: Recent Labs    06/05/20 1016 06/21/20 0819 07/04/20 0905 07/30/20 0935  WBC 12.0* 14.7* 13.2* 10.3  HGB 12.9* 13.6 13.2 13.2  HCT 38.7* 40.0 39.0 38.6*  PLT 384 293 284 254    COAGS: Recent Labs    11/26/19 1319  INR 1.1    BMP: Recent Labs    03/06/20 1130 03/19/20 0840 04/03/20 1145 05/03/20 1515 05/18/20 0833 06/05/20 1016 06/21/20 0819 07/04/20 0905 07/30/20 0935  NA 138 136 139 137   < > 139 137 139 138  K 4.0 3.7 3.8 3.8   < > 4.0 4.0 3.9 4.4  CL 105 106 108 105   < > 107 103 108 106  CO2 23 24 25 25    < > 25 25 21* 28  GLUCOSE 101* 151* 83 88   < > 153* 93 104* 87  BUN 11 13 14 13    < > 15 18 23* 16  CALCIUM 9.3 9.2 9.2 8.9   < > 8.7* 8.7* 9.1 8.9  CREATININE 0.82 0.93 0.82 0.99   < > 0.93 0.84 0.91 0.95  GFRNONAA >60 >60 >60 >60   < > >60 >60 >60 >60  GFRAA >60 >60 >60 >60  --   --   --   --   --    < > = values in this interval not displayed.    LIVER FUNCTION TESTS: Recent Labs    05/18/20 5852 06/05/20 1016 07/04/20  7591 07/30/20 0935  BILITOT 0.8 0.7 0.8 0.7  AST 36 28 18 22   ALT 31 26 29  32  ALKPHOS 179* 210* 129* 131*  PROT 5.9* 6.1* 6.2* 6.2*  ALBUMIN 3.2* 3.1* 3.3* 3.4*    TUMOR MARKERS: No results for input(s): AFPTM, CEA, CA199, CHROMGRNA in the last 8760 hours.  Assessment and Plan:  Scheduled for IVC filter retrieval today Placed 8/21 after developing DVTs while on Xarelto 08/06/20 doppler shows resolution of acute DVT Taking Lovenox daily  Pt is aware of procedure benefits and risks including but not limited to Infection; bleeding;  vessel damage Agreeable to proceed Consent signed and in chart   Thank you for this interesting consult.  I greatly enjoyed meeting Brian [name] and look forward to participating in their care.  A copy of this report was sent to the requesting provider on this date.  Electronically Signed: Lavonia Drafts, PA-C 08/15/2020, 9:18 AM   I spent a total of  30 Minutes   in face to face in clinical consultation, greater than 50% of which was counseling/coordinating care for IVC filter removal

## 2020-08-15 NOTE — Procedures (Signed)
Interventional Radiology Procedure Note  Procedure: IVC FILTER RETRIEVAL    Complications: None  Estimated Blood Loss:  MIN  Findings: SUCCESSFUL REMOVAL     Tamera Punt, MD

## 2020-08-15 NOTE — Discharge Instructions (Addendum)
Inferior Vena Cava Filter Removal, Care After This sheet gives you information about how to care for yourself after your procedure. Your health care provider may also give you more specific instructions. If you have problems or questions, contact your health care provider. What can I expect after the procedure? After the procedure, it is common to have:  Mild pain and bruising around the area where the long, thin tube (catheter) was inserted in your neck or groin.  Tiredness (fatigue). Follow these instructions at home: Insertion site care  Follow instructions from your health care provider about how to take care of your catheter insertion site. Make sure you: ? Wash your hands with soap and water for at least 20 seconds before and after you change your bandage (dressing). If soap and water are not available, use hand sanitizer. ? Change your dressing as told by your health care provider.  Check your insertion site every day for signs of infection. Check for: ? Redness, swelling, or more pain. ? Fluid or blood. ? Warmth. ? Pus or a bad smell.   General instructions  Take over-the-counter and prescription medicines only as told by your health care provider.  Do not take baths, swim, or use a hot tub until your health care provider approves. Ask your health care provider if you may take showers.  If you were given a sedative during the procedure, it can affect you for several hours. Do not drive or operate machinery until your health care provider says that it is safe.  Return to your normal activities as told by your health care provider. Ask your health care provider what activities are safe for you.  Keep all follow-up visits as told by your health care provider. This is important. Contact a health care provider if:  You have chills or a fever.  You have redness, swelling, or more pain around your catheter insertion site.  Your insertion site feels warm to the touch.  You have  pus or a bad smell coming from your insertion site. Get help right away if:  You have blood coming from your catheter insertion site (active bleeding). ? If you have bleeding from the insertion site, lie down, apply pressure to the area with a clean cloth or gauze, and get help right away.  You have chest pain.  You have difficulty breathing. Summary  Follow instructions from your health care provider about how to take care of your catheter insertion site.  Return to your normal activities as told by your health care provider.  Check your catheter insertion site every day for signs of infection.  Get help right away if you have active bleeding, chest pain, or trouble breathing. This information is not intended to replace advice given to you by your health care provider. Make sure you discuss any questions you have with your health care provider. Document Revised: 07/19/2019 Document Reviewed: 07/19/2019 Elsevier Patient Education  2021 [place]. Moderate Conscious Sedation, Adult Sedation is the use of medicines to promote relaxation and to relieve discomfort and anxiety. Moderate conscious sedation is a type of sedation. Under moderate conscious sedation, you are less alert than normal, but you are still able to respond to instructions, touch, or both. Moderate conscious sedation is used during short medical and dental procedures. It is milder than deep sedation, which is a type of sedation under which you cannot be easily woken up. It is also milder than general anesthesia, which is the use of medicines to make  you unconscious. Moderate conscious sedation allows you to return to your regular activities sooner. Tell a health care provider about:  Any allergies you have.  All medicines you are taking, including vitamins, herbs, eye drops, creams, and over-the-counter medicines.  Any use of steroids. This includes steroids taken by mouth or as a cream.  Any problems you or family  members have had with sedatives and anesthetic medicines.  Any blood disorders you have.  Any surgeries you have had.  Any medical conditions you have, such as sleep apnea.  Whether you are pregnant or may be pregnant.  Any use of cigarettes, alcohol, marijuana, or drugs. What are the risks? Generally, this is a safe procedure. However, problems may occur, including:  Getting too much medicine (oversedation).  Nausea.  Allergic reaction to medicines.  Trouble breathing. If this happens, a breathing tube may be used. It will be removed when you are awake and breathing on your own.  Heart trouble.  Lung trouble.  Confusion that gets better with time (emergence delirium). What happens before the procedure? Staying hydrated Follow instructions from your health care provider about hydration, which may include:  Up to 2 hours before the procedure - you may continue to drink clear liquids, such as water, clear fruit juice, black coffee, and plain tea. Eating and drinking restrictions Follow instructions from your health care provider about eating and drinking, which may include:  8 hours before the procedure - stop eating heavy meals or foods, such as meat, fried foods, or fatty foods.  6 hours before the procedure - stop eating light meals or foods, such as toast or cereal.  6 hours before the procedure - stop drinking milk or drinks that contain milk.  2 hours before the procedure - stop drinking clear liquids. Medicines Ask your health care provider about:  Changing or stopping your regular medicines. This is especially important if you are taking diabetes medicines or blood thinners.  Taking medicines such as aspirin and ibuprofen. These medicines can thin your blood. Do not take these medicines unless your health care provider tells you to take them.  Taking over-the-counter medicines, vitamins, herbs, and supplements. Tests and exams  You will have a physical  exam.  You may have blood tests done to show how well: ? Your kidneys and liver work. ? Your blood clots. General instructions  Plan to have a responsible adult take you home from the hospital or clinic.  If you will be going home right after the procedure, plan to have a responsible adult care for you for the time you are told. This is important. What happens during the procedure?  You will be given the sedative. The sedative may be given: ? As a pill that you will swallow. It can also be inserted into the rectum. ? As a spray through the nose. ? As an injection into the muscle. ? As an injection into the vein through an IV.  You may be given oxygen as needed.  Your breathing, heart rate, and blood pressure will be monitored during the procedure.  The medical or dental procedure will be done. The procedure may vary among health care providers and hospitals.   What happens after the procedure?  Your blood pressure, heart rate, breathing rate, and blood oxygen level will be monitored until you leave the hospital or clinic.  You will get fluids through your IV if needed.  Do not drive or operate machinery until your health care provider says that  it is safe. Summary  Sedation is the use of medicines to promote relaxation and to relieve discomfort and anxiety. Moderate conscious sedation is a type of sedation that is used during short medical and dental procedures.  Tell the health care provider about any medical conditions that you have and about all the medicines that you are taking.  You will be given the sedative as a pill, a spray through the nose, an injection into the muscle, or an injection into the vein through an IV. Vital signs are monitored during the sedation.  Moderate conscious sedation allows you to return to your regular activities sooner. This information is not intended to replace advice given to you by your health care provider. Make sure you discuss any  questions you have with your health care provider. Document Revised: 11/17/2019 Document Reviewed: 06/15/2019 Elsevier Patient Education  2021 [employer].

## 2020-08-16 ENCOUNTER — Encounter (HOSPITAL_COMMUNITY): Payer: Self-pay

## 2020-09-02 ENCOUNTER — Other Ambulatory Visit: Payer: Self-pay | Admitting: Physician Assistant

## 2020-09-02 DIAGNOSIS — I8290 Acute embolism and thrombosis of unspecified vein: Secondary | ICD-10-CM

## 2020-09-12 ENCOUNTER — Inpatient Hospital Stay: Payer: BC Managed Care – PPO

## 2020-09-12 ENCOUNTER — Other Ambulatory Visit: Payer: Self-pay

## 2020-09-12 ENCOUNTER — Encounter: Payer: Self-pay | Admitting: Internal Medicine

## 2020-09-12 ENCOUNTER — Inpatient Hospital Stay: Payer: BC Managed Care – PPO | Attending: Internal Medicine | Admitting: Internal Medicine

## 2020-09-12 VITALS — BP 101/74 | HR 79 | Temp 97.1°F | Resp 20 | Ht 68.0 in | Wt 176.3 lb

## 2020-09-12 DIAGNOSIS — E119 Type 2 diabetes mellitus without complications: Secondary | ICD-10-CM | POA: Diagnosis not present

## 2020-09-12 DIAGNOSIS — C3411 Malignant neoplasm of upper lobe, right bronchus or lung: Secondary | ICD-10-CM | POA: Insufficient documentation

## 2020-09-12 DIAGNOSIS — C3491 Malignant neoplasm of unspecified part of right bronchus or lung: Secondary | ICD-10-CM | POA: Diagnosis not present

## 2020-09-12 DIAGNOSIS — Z79899 Other long term (current) drug therapy: Secondary | ICD-10-CM | POA: Diagnosis not present

## 2020-09-12 DIAGNOSIS — Z9221 Personal history of antineoplastic chemotherapy: Secondary | ICD-10-CM | POA: Insufficient documentation

## 2020-09-12 DIAGNOSIS — C3490 Malignant neoplasm of unspecified part of unspecified bronchus or lung: Secondary | ICD-10-CM | POA: Diagnosis not present

## 2020-09-12 DIAGNOSIS — J449 Chronic obstructive pulmonary disease, unspecified: Secondary | ICD-10-CM | POA: Diagnosis not present

## 2020-09-12 DIAGNOSIS — Z86711 Personal history of pulmonary embolism: Secondary | ICD-10-CM | POA: Insufficient documentation

## 2020-09-12 DIAGNOSIS — Z7901 Long term (current) use of anticoagulants: Secondary | ICD-10-CM | POA: Diagnosis not present

## 2020-09-12 DIAGNOSIS — Z5111 Encounter for antineoplastic chemotherapy: Secondary | ICD-10-CM | POA: Diagnosis not present

## 2020-09-12 DIAGNOSIS — Z923 Personal history of irradiation: Secondary | ICD-10-CM | POA: Diagnosis not present

## 2020-09-12 DIAGNOSIS — I10 Essential (primary) hypertension: Secondary | ICD-10-CM | POA: Diagnosis not present

## 2020-09-12 DIAGNOSIS — E039 Hypothyroidism, unspecified: Secondary | ICD-10-CM | POA: Insufficient documentation

## 2020-09-12 DIAGNOSIS — C7931 Secondary malignant neoplasm of brain: Secondary | ICD-10-CM | POA: Diagnosis not present

## 2020-09-12 DIAGNOSIS — C770 Secondary and unspecified malignant neoplasm of lymph nodes of head, face and neck: Secondary | ICD-10-CM | POA: Diagnosis not present

## 2020-09-12 DIAGNOSIS — Z7984 Long term (current) use of oral hypoglycemic drugs: Secondary | ICD-10-CM | POA: Insufficient documentation

## 2020-09-12 LAB — CMP (CANCER CENTER ONLY)
ALT: 30 U/L (ref 0–44)
AST: 21 U/L (ref 15–41)
Albumin: 3.6 g/dL (ref 3.5–5.0)
Alkaline Phosphatase: 134 U/L — ABNORMAL HIGH (ref 38–126)
Anion gap: 7 (ref 5–15)
BUN: 15 mg/dL (ref 6–20)
CO2: 23 mmol/L (ref 22–32)
Calcium: 8.7 mg/dL — ABNORMAL LOW (ref 8.9–10.3)
Chloride: 109 mmol/L (ref 98–111)
Creatinine: 0.88 mg/dL (ref 0.61–1.24)
GFR, Estimated: >60 mL/min (ref >60)
Glucose, Bld: 98 mg/dL (ref 70–99)
Potassium: 4 mmol/L (ref 3.5–5.1)
Sodium: 139 mmol/L (ref 135–145)
Total Bilirubin: 0.7 mg/dL (ref 0.3–1.2)
Total Protein: 6.1 g/dL — ABNORMAL LOW (ref 6.5–8.1)

## 2020-09-12 LAB — CBC WITH DIFFERENTIAL (CANCER CENTER ONLY)
Abs Immature Granulocytes: 0.2 10*3/uL — ABNORMAL HIGH (ref 0.00–0.07)
Basophils Absolute: 0.1 10*3/uL (ref 0.0–0.1)
Basophils Relative: 1 %
Eosinophils Absolute: 0.3 10*3/uL (ref 0.0–0.5)
Eosinophils Relative: 3 %
HCT: 36.8 % — ABNORMAL LOW (ref 39.0–52.0)
Hemoglobin: 12.7 g/dL — ABNORMAL LOW (ref 13.0–17.0)
Immature Granulocytes: 2 %
Lymphocytes Relative: 12 %
Lymphs Abs: 1.2 10*3/uL (ref 0.7–4.0)
MCH: 31.5 pg (ref 26.0–34.0)
MCHC: 34.5 g/dL (ref 30.0–36.0)
MCV: 91.3 fL (ref 80.0–100.0)
Monocytes Absolute: 1.4 10*3/uL — ABNORMAL HIGH (ref 0.1–1.0)
Monocytes Relative: 14 %
Neutro Abs: 6.7 10*3/uL (ref 1.7–7.7)
Neutrophils Relative %: 68 %
Platelet Count: 274 10*3/uL (ref 150–400)
RBC: 4.03 MIL/uL — ABNORMAL LOW (ref 4.22–5.81)
RDW: 18 % — ABNORMAL HIGH (ref 11.5–15.5)
WBC Count: 9.9 10*3/uL (ref 4.0–10.5)
nRBC: 0 % (ref 0.0–0.2)

## 2020-09-12 LAB — CK: Total CK: 102 U/L (ref 49–397)

## 2020-09-12 NOTE — Progress Notes (Signed)
[facility] [phone]   [phone]  OFFICE PROGRESS NOTE  [name], MD [place] [id number]  DIAGNOSIS:  Metastatic non-small cell lung cancer initially diagnosed as stage IIIB (T1c, N3, M0)non-small cell lung cancer, adenocarcinoma presented with right upper lobe lung mass in addition to mediastinal and bilateral supraclavicular lymphadenopathy diagnosed in January 2020.  He had evidence of disease recurrence in June 2021 with adenopathy in the subcarinal, level 3 left neck lymph node, and lymph node at the curious of the diaphragm and in the upper abdomen.  PD-L1: 10%  Guardant 360 molecular studiesshowed no actionable mutation  Foundation One Testing:  Biomarker Findings Microsatellite status - MS-Stable Tumor Mutational Burden - 4 Muts/Mb Genomic Findings For a complete list of the genes assayed, please refer to the Appendix. ALK EML4-ALK fusion (Variant 2) SMARCB1 R377C CTNNB1 S45P CDKN2A/B CDKN2B loss, CDKN2A loss CXCR4 Q300* PQ33 splice site 007M>A 7 Disease relevant genes with no reportable alterations: BRAF, EGFR, ERBB2, KRAS, MET, RET, ROS1  PRIOR THERAPY: 1)Concurrent chemoradiation with weekly carboplatin for AUC of 2 and paclitaxel 45 mg/M2.Status post 6 cycles. Last dose was given on October 10, 2018 with stable disease. 2) Radiation treatment to the enlarging right cervical lymph nodes under the care of Dr. Sondra Come. First treatment 03/06/2019. Last treatment scheduledon9/05/2019 3)Consolidation treatment with immunotherapy with Imfinzi 10 mg/KG every 2 weeks. First dose November 17, 2018. Status post26cycles. 4) Systemic chemotherapy with carboplatin for an AUC of 5, Alimta 500 mg/m2, and Keytruda 200 mg IV every 3 weeks. First dose expected on 02/14/20. Status post 1 cycle.  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 5) SBRT to the left lower lobe lung nodule.  CURRENT THERAPY:  [name] ([name]) 600 mg p.o. twice daily.  He started the first dose on March 08, 2020.   Status post 6 months of treatment.  INTERVAL HISTORY: Brian [name] 60 y.o. male returns to the clinic today for follow-up visit.  The patient is feeling fine today with no concerning complaints except for the previous muscle weakness and he is currently exercising at regular basis to gain his muscle strength back he denied having any current chest pain, shortness of breath, cough or hemoptysis.  He denied having any fever or chills.  He has no nausea, vomiting, diarrhea or constipation.  The patient denied having any weight loss or night sweats.  He has no headache or visual changes.  He continues to tolerate his treatment with [name] fairly well.  He is here today for evaluation and repeat blood work.  MEDICAL HISTORY: Past Medical History:  Diagnosis Date  . COPD (chronic obstructive pulmonary disease) ([place])   . Diabetes mellitus without complication ([place])   . Hypertension   . nscl ca dx'd 08/2018  . Pneumonia   . Subarachnoid hemorrhage ([facility])     ALLERGIES:  has No Known Allergies.  MEDICATIONS:  Current Outpatient Medications  Medication Sig Dispense Refill  . albuterol (VENTOLIN HFA) 108 (90 Base) MCG/ACT inhaler Inhale 2 puffs into the lungs every 4 (four) hours as needed for wheezing or shortness of breath. 6.7 g 5  . [name] 150 MG capsule TAKE 4 CAPSULES BY MOUTH TWICE DAILY WITH FOOD. SWALLOW CAPSULES WHOLE. DO NOT OPEN OR CRUSH. STORE IN ORIGINAL CONTAINER. (Patient taking differently: Take 600 mg by mouth 2 (two) times daily  with a meal.) 240 capsule 2  . amLODipine (NORVASC) 5 MG tablet Take 5 mg by mouth daily.     Marland Kitchen atorvastatin (LIPITOR) 20 MG tablet Take 20 mg by mouth daily.     . B Complex Vitamins (B COMPLEX PO) Take 1 tablet by mouth daily.    Marland Kitchen enoxaparin (LOVENOX) 120 MG/0.8ML  injection INJECT 0.8 MLS (120 MG TOTAL) INTO THE SKIN DAILY. 24 mL 2  . levothyroxine (SYNTHROID) 100 MCG tablet Take 1 tablet (100 mcg total) by mouth daily before breakfast. 90 tablet 1  . magnesium hydroxide (MILK OF MAGNESIA) 400 MG/5ML suspension Take 15 mLs by mouth daily as needed for mild constipation.    . metFORMIN (GLUCOPHAGE) 500 MG tablet Take 500 mg by mouth daily with breakfast.     . Multiple Vitamin (MULTIVITAMIN) tablet Take 1 tablet by mouth daily.    Marland Kitchen Respiratory Therapy Supplies (FLUTTER) DEVI Please use up 10 times daily(4-5 breaths, 4-5 times daily) 1 each 0  . TRELEGY ELLIPTA 100-62.5-25 MCG/INH AEPB Inhale 1 puff into the lungs daily.    Marland Kitchen venlafaxine (EFFEXOR) 37.5 MG tablet Take 37.5 mg by mouth daily.      No current facility-administered medications for this visit.    SURGICAL HISTORY:  Past Surgical History:  Procedure Laterality Date  . INGUINAL HERNIA REPAIR Right 06/24/2020   Procedure: RIGHT OPEN INGUINAL HERNIA REPAIR WITH MESH;  Surgeon: Kinsinger, Arta Bruce, MD;  Location: [facility];  Service: General;  Laterality: Right;  . IR IVC FILTER PLMT / S&I Burke Keels GUID/MOD SED  03/04/2020  . IR IVC FILTER RETRIEVAL / S&I /IMG GUID/MOD SED  08/15/2020  . IR RADIOLOGIST EVAL & MGMT  08/06/2020  . LEG SURGERY  age 56    left leg, from fracture    REVIEW OF SYSTEMS:  A comprehensive review of systems was negative except for: Musculoskeletal: positive for muscle weakness   PHYSICAL EXAMINATION: General appearance: alert, cooperative and no distress Head: Normocephalic, without obvious abnormality, atraumatic Neck: no adenopathy, no JVD, supple, symmetrical, trachea midline and thyroid not enlarged, symmetric, no tenderness/mass/nodules Lymph nodes: Cervical, supraclavicular, and axillary nodes normal. Resp: clear to auscultation bilaterally Back: symmetric, no curvature. ROM normal. No CVA tenderness. Cardio: regular rate and rhythm, S1, S2 normal, no murmur, click, rub  or gallop GI: soft, non-tender; bowel sounds normal; no masses,  no organomegaly Extremities: extremities normal, atraumatic, no cyanosis or edema  ECOG PERFORMANCE STATUS: 1 - Symptomatic but completely ambulatory  Blood pressure 101/74, pulse 79, temperature (!) 97.1 F (36.2 C), temperature source Tympanic, resp. rate 20, height '5\' 8"'  (1.727 m), weight 176 lb 4.8 oz (80 kg), SpO2 98 %.  LABORATORY DATA: Lab Results  Component Value Date   WBC 9.9 09/12/2020   HGB 12.7 (L) 09/12/2020   HCT 36.8 (L) 09/12/2020   MCV 91.3 09/12/2020   PLT 274 09/12/2020      Chemistry      Component Value Date/Time   NA 138 07/30/2020 0935   K 4.4 07/30/2020 0935   CL 106 07/30/2020 0935   CO2 28 07/30/2020 0935   BUN 16 07/30/2020 0935   CREATININE 0.95 07/30/2020 0935      Component Value Date/Time   CALCIUM 8.9 07/30/2020 0935   ALKPHOS 131 (H) 07/30/2020 0935   AST 22 07/30/2020 0935   ALT 32 07/30/2020 0935   BILITOT 0.7 07/30/2020 0935       RADIOGRAPHIC STUDIES: IR IVC Filter Retrieval / S&I Burke Keels  Guid/Mod Sed  Result Date: 08/15/2020 INDICATION: Acute DVT status post IVC filter 03/04/2020. Patient is now an outpatient, and the tori and in no longer need of the filter. He remains anticoagulated. Request for filter removal. EXAM: Ultrasound guidance for vascular access IVC catheterization, venogram, and successful Bard Denali filter retrieval MEDICATIONS: 1% lidocaine local ANESTHESIA/SEDATION: 1.0 mg IV Versed; 50 mcg IV Fentanyl Moderate Sedation Time:  20 minutes The patient was continuously monitored during the procedure by the interventional radiology nurse under my direct supervision. FLUOROSCOPY TIME:  Fluoroscopy Time: 2 minutes 0 seconds (77 mGy). COMPLICATIONS: None immediate. PROCEDURE: Informed written consent was obtained from the patient after a thorough discussion of the procedural risks, benefits and alternatives. All questions were addressed. Maximal Sterile Barrier  Technique was utilized including caps, mask, sterile gowns, sterile gloves, sterile drape, hand hygiene and skin antiseptic. A timeout was performed prior to the initiation of the procedure. Under sterile conditions and local anesthesia, ultrasound micropuncture access initially attempted of the small thick-walled internal jugular vein. This was unsuccessful. Attention was directed to the external jugular vein. Needle access was performed with success. 018 guidewire advanced centrally. Bentson guidewire inserted into the IVC. Tract dilatation performed to advance the retrieval sheath. Initial IVC venogram performed. IVC venogram: IVC is patent. Filter is well centered in the infrarenal IVC. Negative for thrombus or occlusion. IVC filter retrieval: The snare device was utilized to capture the apex of the filter. The outer sheaths were advanced over the filter to collapse and removed entirely. Completion IVC venogram demonstrates no complicating feature. Preserved IVC patency. Hemostasis obtained with main compression. No immediate complication. IMPRESSION: Successful Bard Denali IVC filter retrieval. PLAN: IVC filter successfully removed.  No further follow-up necessary. Electronically Signed   By: [name].  Shick M.D.   On: 08/15/2020 13:44    ASSESSMENT AND PLAN: This is a very pleasant 60 years old white male with metastatic non-small cell lung cancer, adenocarcinoma now with ALK gene translocation.  The patient was initially diagnosed as a stage IIIa non-small cell lung cancer status post a course of concurrent chemoradiation with weekly carboplatin and paclitaxel followed by 1 year of consolidation treatment with immunotherapy with Imfinzi. He had evidence for disease progression recently. Repeat tissue biopsy from the left supraclavicular lymph nodes and molecular studies showed that the patient has positive ALK gene translocation.  His previous molecular studies by Guardant 360 was negative. I had a lengthy  discussion with the patient today about his condition and treatment options. He received 1 cycle of systemic chemotherapy with carboplatin, Alimta and Keytruda.  We will discontinue his systemic chemotherapy for now because of the new findings on the molecular studies. The patient is currently on treatment with [name] 600 mg p.o. twice daily.  He is status post 6 months of treatment.   The patient continues to tolerate his treatment well with no concerning adverse effects. I recommended for him to continue his current treatment with [name] with the same dose. I will see him back for follow-up visit in 1 months for evaluation with repeat CT scan of the chest, abdomen pelvis for restaging of his disease. For the hypothyroidism, he will continue his current treatment with levothyroxine and I will refer him to endocrinology for evaluation. For the history of pulmonary embolism, he will continue his current treatment on Lovenox for now.  He is wondering about switching to oral medication again.  I may consider switching him to Eliquis 5 mg p.o. twice daily after he completes  this months of treatment with Lovenox. The patient was advised to call immediately if he has any concerning symptoms in the interval. The patient voices understanding of current disease status and treatment options and is in agreement with the current care plan.  All questions were answered. The patient knows to call the clinic with any problems, questions or concerns. We can certainly see the patient much sooner if necessary.  Disclaimer: This note was dictated with voice recognition software. Similar sounding words can inadvertently be transcribed and may not be corrected upon review.

## 2020-09-13 ENCOUNTER — Other Ambulatory Visit: Payer: Self-pay

## 2020-09-17 ENCOUNTER — Ambulatory Visit: Payer: BC Managed Care – PPO | Admitting: Endocrinology

## 2020-09-29 ENCOUNTER — Encounter: Payer: Self-pay | Admitting: Internal Medicine

## 2020-09-30 NOTE — Telephone Encounter (Signed)
I spoke with pt requesting to know if he has enough medication to carry him to his next appt on 10/09/20. Pt indicated he does have enough and agrees to discuss his plan with Dr. Julien Nordmann at his upcoming appt. No further assistance was needed at this time.

## 2020-10-01 ENCOUNTER — Encounter: Payer: Self-pay | Admitting: Internal Medicine

## 2020-10-03 ENCOUNTER — Other Ambulatory Visit: Payer: BC Managed Care – PPO

## 2020-10-04 ENCOUNTER — Encounter: Payer: Self-pay | Admitting: Internal Medicine

## 2020-10-07 ENCOUNTER — Inpatient Hospital Stay: Payer: BC Managed Care – PPO | Attending: Internal Medicine

## 2020-10-07 ENCOUNTER — Encounter (HOSPITAL_BASED_OUTPATIENT_CLINIC_OR_DEPARTMENT_OTHER): Payer: Self-pay

## 2020-10-07 ENCOUNTER — Telehealth: Payer: Self-pay

## 2020-10-07 ENCOUNTER — Other Ambulatory Visit: Payer: Self-pay

## 2020-10-07 ENCOUNTER — Ambulatory Visit (HOSPITAL_BASED_OUTPATIENT_CLINIC_OR_DEPARTMENT_OTHER)
Admission: RE | Admit: 2020-10-07 | Discharge: 2020-10-07 | Disposition: A | Discharge status: Home / Self Care | Payer: BC Managed Care – PPO | Source: Ambulatory Visit | Attending: Internal Medicine | Admitting: Internal Medicine

## 2020-10-07 DIAGNOSIS — Z79899 Other long term (current) drug therapy: Secondary | ICD-10-CM | POA: Insufficient documentation

## 2020-10-07 DIAGNOSIS — Z923 Personal history of irradiation: Secondary | ICD-10-CM | POA: Insufficient documentation

## 2020-10-07 DIAGNOSIS — E119 Type 2 diabetes mellitus without complications: Secondary | ICD-10-CM | POA: Diagnosis not present

## 2020-10-07 DIAGNOSIS — J449 Chronic obstructive pulmonary disease, unspecified: Secondary | ICD-10-CM | POA: Diagnosis not present

## 2020-10-07 DIAGNOSIS — E039 Hypothyroidism, unspecified: Secondary | ICD-10-CM | POA: Diagnosis not present

## 2020-10-07 DIAGNOSIS — C770 Secondary and unspecified malignant neoplasm of lymph nodes of head, face and neck: Secondary | ICD-10-CM | POA: Diagnosis not present

## 2020-10-07 DIAGNOSIS — Z9221 Personal history of antineoplastic chemotherapy: Secondary | ICD-10-CM | POA: Insufficient documentation

## 2020-10-07 DIAGNOSIS — Z7984 Long term (current) use of oral hypoglycemic drugs: Secondary | ICD-10-CM | POA: Diagnosis not present

## 2020-10-07 DIAGNOSIS — Z7901 Long term (current) use of anticoagulants: Secondary | ICD-10-CM | POA: Diagnosis not present

## 2020-10-07 DIAGNOSIS — C3411 Malignant neoplasm of upper lobe, right bronchus or lung: Secondary | ICD-10-CM | POA: Diagnosis not present

## 2020-10-07 DIAGNOSIS — C3490 Malignant neoplasm of unspecified part of unspecified bronchus or lung: Secondary | ICD-10-CM | POA: Diagnosis not present

## 2020-10-07 DIAGNOSIS — I10 Essential (primary) hypertension: Secondary | ICD-10-CM | POA: Diagnosis not present

## 2020-10-07 LAB — CBC WITH DIFFERENTIAL (CANCER CENTER ONLY)
Abs Immature Granulocytes: 0.17 10*3/uL — ABNORMAL HIGH (ref 0.00–0.07)
Basophils Absolute: 0.1 10*3/uL (ref 0.0–0.1)
Basophils Relative: 1 %
Eosinophils Absolute: 0.3 10*3/uL (ref 0.0–0.5)
Eosinophils Relative: 3 %
HCT: 38.5 % — ABNORMAL LOW (ref 39.0–52.0)
Hemoglobin: 13.1 g/dL (ref 13.0–17.0)
Immature Granulocytes: 2 %
Lymphocytes Relative: 11 %
Lymphs Abs: 1.2 10*3/uL (ref 0.7–4.0)
MCH: 31.8 pg (ref 26.0–34.0)
MCHC: 34 g/dL (ref 30.0–36.0)
MCV: 93.4 fL (ref 80.0–100.0)
Monocytes Absolute: 1.6 10*3/uL — ABNORMAL HIGH (ref 0.1–1.0)
Monocytes Relative: 14 %
Neutro Abs: 8 10*3/uL — ABNORMAL HIGH (ref 1.7–7.7)
Neutrophils Relative %: 69 %
Platelet Count: 284 10*3/uL (ref 150–400)
RBC: 4.12 MIL/uL — ABNORMAL LOW (ref 4.22–5.81)
RDW: 17.8 % — ABNORMAL HIGH (ref 11.5–15.5)
WBC Count: 11.4 10*3/uL — ABNORMAL HIGH (ref 4.0–10.5)
nRBC: 0 % (ref 0.0–0.2)

## 2020-10-07 LAB — CMP (CANCER CENTER ONLY)
ALT: 29 U/L (ref 0–44)
AST: 27 U/L (ref 15–41)
Albumin: 4.3 g/dL (ref 3.5–5.0)
Alkaline Phosphatase: 147 U/L — ABNORMAL HIGH (ref 38–126)
Anion gap: 8 (ref 5–15)
BUN: 16 mg/dL (ref 6–20)
CO2: 27 mmol/L (ref 22–32)
Calcium: 9.2 mg/dL (ref 8.9–10.3)
Chloride: 103 mmol/L (ref 98–111)
Creatinine: 0.96 mg/dL (ref 0.61–1.24)
GFR, Estimated: >60 mL/min (ref >60)
Glucose, Bld: 108 mg/dL — ABNORMAL HIGH (ref 70–99)
Potassium: 4.1 mmol/L (ref 3.5–5.1)
Sodium: 138 mmol/L (ref 135–145)
Total Bilirubin: 0.9 mg/dL (ref 0.3–1.2)
Total Protein: 6.2 g/dL — ABNORMAL LOW (ref 6.5–8.1)

## 2020-10-07 MED ORDER — IOHEXOL 300 MG/ML  SOLN
100.0000 mL | Freq: Once | INTRAMUSCULAR | Status: AC | PRN
  Administered 2020-10-07: 100 mL via INTRAVENOUS

## 2020-10-07 NOTE — Telephone Encounter (Signed)
Left patient voicemail message in regards to telephone appointment with Shona Simpson PA on 10/14/20 @ 1:30pm. Advised that the automated system might call for an in person visit however this is a telephone visit. Called to review meaningful use questions. TM

## 2020-10-08 ENCOUNTER — Inpatient Hospital Stay: Payer: BC Managed Care – PPO

## 2020-10-08 ENCOUNTER — Encounter: Payer: Self-pay | Admitting: Internal Medicine

## 2020-10-09 ENCOUNTER — Inpatient Hospital Stay (HOSPITAL_BASED_OUTPATIENT_CLINIC_OR_DEPARTMENT_OTHER): Payer: BC Managed Care – PPO | Admitting: Internal Medicine

## 2020-10-09 ENCOUNTER — Telehealth: Payer: Self-pay

## 2020-10-09 ENCOUNTER — Other Ambulatory Visit: Payer: Self-pay

## 2020-10-09 ENCOUNTER — Encounter: Payer: Self-pay | Admitting: Radiation Oncology

## 2020-10-09 ENCOUNTER — Encounter: Payer: Self-pay | Admitting: Internal Medicine

## 2020-10-09 VITALS — BP 111/82 | HR 82 | Temp 97.6°F | Resp 18 | Ht 68.0 in | Wt 184.2 lb

## 2020-10-09 DIAGNOSIS — Z9221 Personal history of antineoplastic chemotherapy: Secondary | ICD-10-CM | POA: Diagnosis not present

## 2020-10-09 DIAGNOSIS — C3491 Malignant neoplasm of unspecified part of right bronchus or lung: Secondary | ICD-10-CM | POA: Diagnosis not present

## 2020-10-09 DIAGNOSIS — C3411 Malignant neoplasm of upper lobe, right bronchus or lung: Secondary | ICD-10-CM | POA: Diagnosis not present

## 2020-10-09 DIAGNOSIS — Z5111 Encounter for antineoplastic chemotherapy: Secondary | ICD-10-CM | POA: Diagnosis not present

## 2020-10-09 DIAGNOSIS — Z79899 Other long term (current) drug therapy: Secondary | ICD-10-CM | POA: Diagnosis not present

## 2020-10-09 DIAGNOSIS — Z7984 Long term (current) use of oral hypoglycemic drugs: Secondary | ICD-10-CM | POA: Diagnosis not present

## 2020-10-09 DIAGNOSIS — C770 Secondary and unspecified malignant neoplasm of lymph nodes of head, face and neck: Secondary | ICD-10-CM | POA: Diagnosis not present

## 2020-10-09 DIAGNOSIS — Z7901 Long term (current) use of anticoagulants: Secondary | ICD-10-CM | POA: Diagnosis not present

## 2020-10-09 DIAGNOSIS — C7931 Secondary malignant neoplasm of brain: Secondary | ICD-10-CM | POA: Diagnosis not present

## 2020-10-09 DIAGNOSIS — E119 Type 2 diabetes mellitus without complications: Secondary | ICD-10-CM | POA: Diagnosis not present

## 2020-10-09 DIAGNOSIS — Z923 Personal history of irradiation: Secondary | ICD-10-CM | POA: Diagnosis not present

## 2020-10-09 DIAGNOSIS — I10 Essential (primary) hypertension: Secondary | ICD-10-CM | POA: Diagnosis not present

## 2020-10-09 DIAGNOSIS — J449 Chronic obstructive pulmonary disease, unspecified: Secondary | ICD-10-CM | POA: Diagnosis not present

## 2020-10-09 DIAGNOSIS — E039 Hypothyroidism, unspecified: Secondary | ICD-10-CM | POA: Diagnosis not present

## 2020-10-09 MED ORDER — APIXABAN 5 MG PO TABS: 5.0000 mg | ORAL_TABLET | Freq: Two times a day (BID) | ORAL | 2 refills | Status: DC

## 2020-10-09 NOTE — Progress Notes (Signed)
Brian [name] [phone]   [phone]  OFFICE PROGRESS NOTE  [name], MD [place] [id number]  DIAGNOSIS:  Metastatic non-small cell lung cancer initially diagnosed as stage IIIB (T1c, N3, M0)non-small cell lung cancer, adenocarcinoma presented with right upper lobe lung mass in addition to mediastinal and bilateral supraclavicular lymphadenopathy diagnosed in January 2020.  He had evidence of disease recurrence in June 2021 with adenopathy in the subcarinal, level 3 left neck lymph node, and lymph node at the curious of the diaphragm and in the upper abdomen.  PD-L1: 10%  [employer] 360 molecular studiesshowed no actionable mutation  [employer] Testing:  Biomarker Findings Microsatellite status - MS-Stable Tumor Mutational Burden - 4 Muts/Mb Genomic Findings For a complete list of the genes assayed, please refer to the Appendix. ALK EML4-ALK fusion (Variant 2) SMARCB1 R377C CTNNB1 S45P CDKN2A/B CDKN2B loss, CDKN2A loss CXCR4 T419* QQ22 splice site 979G>X 7 Disease relevant genes with no reportable alterations: BRAF, EGFR, ERBB2, KRAS, MET, RET, ROS1  PRIOR THERAPY: 1)Concurrent chemoradiation with weekly carboplatin for AUC of 2 and paclitaxel 45 mg/M2.Status post 6 cycles. Last dose was given on October 10, 2018 with stable disease. 2) Radiation treatment to the enlarging right cervical lymph nodes under the care of Dr. Sondra Come. First treatment 03/06/2019. Last treatment scheduledon9/05/2019 3)Consolidation treatment with immunotherapy with Imfinzi 10 mg/KG every 2 weeks. First dose November 17, 2018. Status post26cycles. 4) Systemic chemotherapy with carboplatin for an AUC of 5, Alimta 500 mg/m2, and Keytruda 200 mg IV every 3 weeks. First dose expected on 02/14/20. Status post 1 cycle.  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 5) SBRT to the left lower lobe lung nodule.  CURRENT THERAPY:  [name] ([name]) 600 mg p.o. twice daily.  He started the first dose on March 08, 2020.   Status post 7 months of treatment.  INTERVAL HISTORY: HELIOS KOHLMANN 60 y.o. male returns to the clinic today for follow-up visit accompanied by his wife.  The patient is feeling fine today with no concerning complaints except for mild shortness of breath with exertion.  He played golf yesterday and did not have any issues.  He denied having any current chest pain, cough or hemoptysis.  He denied having any weight loss or night sweats.  He has no nausea, vomiting, diarrhea or constipation.  He denied having any headache or visual changes.  He had repeat CT scan of the chest, abdomen pelvis performed recently and he is here for evaluation and discussion of his scan results.  The patient is also scheduled for MRI of the brain tomorrow.   MEDICAL HISTORY: Past Medical History:  Diagnosis Date  . COPD (chronic obstructive pulmonary disease) ([facility])   . Diabetes mellitus without complication ([facility])   . Hypertension   . nscl ca dx'd 08/2018  . Pneumonia   . Subarachnoid hemorrhage ([facility])     ALLERGIES:  has No Known Allergies.  MEDICATIONS:  Current Outpatient Medications  Medication Sig Dispense Refill  . albuterol (VENTOLIN HFA) 108 (90 Base) MCG/ACT inhaler Inhale 2 puffs into the lungs every 4 (four) hours as needed for wheezing or shortness of breath. 6.7 g 5  . [name] 150 MG capsule TAKE 4 CAPSULES BY MOUTH TWICE DAILY WITH FOOD. SWALLOW CAPSULES WHOLE. DO NOT OPEN OR CRUSH. STORE IN ORIGINAL CONTAINER. (Patient taking differently:  Take 600 mg by mouth 2 (two) times daily with a meal.) 240 capsule 2  . amLODipine (NORVASC) 5 MG tablet Take 5 mg by mouth daily.     Marland Kitchen atorvastatin (LIPITOR) 20 MG tablet Take 20 mg by mouth daily.     . B Complex Vitamins (B COMPLEX PO) Take 1 tablet by mouth daily.    Marland Kitchen  enoxaparin (LOVENOX) 120 MG/0.8ML injection INJECT 0.8 MLS (120 MG TOTAL) INTO THE SKIN DAILY. 24 mL 2  . levothyroxine (SYNTHROID) 100 MCG tablet Take 1 tablet (100 mcg total) by mouth daily before breakfast. 90 tablet 1  . magnesium hydroxide (MILK OF MAGNESIA) 400 MG/5ML suspension Take 15 mLs by mouth daily as needed for mild constipation.    . metFORMIN (GLUCOPHAGE) 500 MG tablet Take 500 mg by mouth daily with breakfast.     . Multiple Vitamin (MULTIVITAMIN) tablet Take 1 tablet by mouth daily.    Marland Kitchen Respiratory Therapy Supplies (FLUTTER) DEVI Please use up 10 times daily(4-5 breaths, 4-5 times daily) 1 each 0  . TRELEGY ELLIPTA 100-62.5-25 MCG/INH AEPB Inhale 1 puff into the lungs daily.    Marland Kitchen venlafaxine (EFFEXOR) 37.5 MG tablet Take 37.5 mg by mouth daily.      No current facility-administered medications for this visit.    SURGICAL HISTORY:  Past Surgical History:  Procedure Laterality Date  . INGUINAL HERNIA REPAIR Right 06/24/2020   Procedure: RIGHT OPEN INGUINAL HERNIA REPAIR WITH MESH;  Surgeon: Kinsinger, Arta Bruce, MD;  Location: [facility];  Service: General;  Laterality: Right;  . IR IVC FILTER PLMT / S&I Burke Keels GUID/MOD SED  03/04/2020  . IR IVC FILTER RETRIEVAL / S&I /IMG GUID/MOD SED  08/15/2020  . IR RADIOLOGIST EVAL & MGMT  08/06/2020  . LEG SURGERY  age 47    left leg, from fracture    REVIEW OF SYSTEMS:  Constitutional: negative Eyes: negative Ears, nose, mouth, throat, and face: negative Respiratory: positive for dyspnea on exertion Cardiovascular: negative Gastrointestinal: negative Genitourinary:negative Integument/breast: negative Hematologic/lymphatic: negative Musculoskeletal:negative Neurological: negative Behavioral/Psych: negative Endocrine: negative Allergic/Immunologic: negative   PHYSICAL EXAMINATION: General appearance: alert, cooperative and no distress Head: Normocephalic, without obvious abnormality, atraumatic Neck: no adenopathy, no JVD, supple,  symmetrical, trachea midline and thyroid not enlarged, symmetric, no tenderness/mass/nodules Lymph nodes: Cervical, supraclavicular, and axillary nodes normal. Resp: clear to auscultation bilaterally Back: symmetric, no curvature. ROM normal. No CVA tenderness. Cardio: regular rate and rhythm, S1, S2 normal, no murmur, click, rub or gallop GI: soft, non-tender; bowel sounds normal; no masses,  no organomegaly Extremities: extremities normal, atraumatic, no cyanosis or edema Neurologic: Alert and oriented X 3, normal strength and tone. Normal symmetric reflexes. Normal coordination and gait  ECOG PERFORMANCE STATUS: 1 - Symptomatic but completely ambulatory  Blood pressure 111/82, pulse 82, temperature 97.6 F (36.4 C), temperature source Tympanic, resp. rate 18, height '5\' 8"'  (1.727 m), weight 184 lb 3.2 oz (83.6 kg), SpO2 98 %.  LABORATORY DATA: Lab Results  Component Value Date   WBC 11.4 (H) 10/07/2020   HGB 13.1 10/07/2020   HCT 38.5 (L) 10/07/2020   MCV 93.4 10/07/2020   PLT 284 10/07/2020      Chemistry      Component Value Date/Time   NA 138 10/07/2020 1246   K 4.1 10/07/2020 1246   CL 103 10/07/2020 1246   CO2 27 10/07/2020 1246   BUN 16 10/07/2020 1246   CREATININE 0.96 10/07/2020 1246      Component Value Date/Time  CALCIUM 9.2 10/07/2020 1246   ALKPHOS 147 (H) 10/07/2020 1246   AST 27 10/07/2020 1246   ALT 29 10/07/2020 1246   BILITOT 0.9 10/07/2020 1246       RADIOGRAPHIC STUDIES: CT Chest W Contrast  Result Date: 10/07/2020 CLINICAL DATA:  Non-small cell lung cancer. Restaging. On oral chemotherapy. EXAM: CT CHEST, ABDOMEN, AND PELVIS WITH CONTRAST TECHNIQUE: Multidetector CT imaging of the chest, abdomen and pelvis was performed following the standard protocol during bolus administration of intravenous contrast. CONTRAST:  161m OMNIPAQUE IOHEXOL 300 MG/ML  SOLN COMPARISON:  07/30/2020 FINDINGS: CT CHEST FINDINGS Cardiovascular: Aortic atherosclerosis.  Normal heart size, without pericardial effusion. Mediastinum/Nodes: No mediastinal or hilar adenopathy. Lungs/Pleura: Moderate right pleural effusion, increased. No evidence of pleural nodularity or pleural based mass. Redemonstration of right perihilar ground-glass and airspace opacity with architectural distortion and traction bronchiectasis. The tiny left lower lobe ground-glass nodule is similar to less distinct today, including at 2 mm on 117/4. No new pulmonary nodules. Musculoskeletal: No acute osseous abnormality. CT ABDOMEN PELVIS FINDINGS Hepatobiliary: Normal liver. Normal gallbladder, without biliary ductal dilatation. Pancreas: Fatty atrophy involving the pancreatic head and neck. No duct dilatation or acute inflammation. Spleen: Normal in size, without focal abnormality. Adrenals/Urinary Tract: Normal adrenal glands. Too small to characterize upper pole left renal lesion is most likely a cyst. Normal right kidney, without hydronephrosis. Normal urinary bladder. Stomach/Bowel: Normal stomach, without wall thickening. Normal colon, appendix, Colonic stool burden suggests constipation. Normal terminal ileum and appendix. Normal small bowel. Vascular/Lymphatic: Aortic atherosclerosis. No abdominopelvic adenopathy. Reproductive: Normal prostate. Other: No significant free fluid. Left inguinal hernia contains fat. Prior right inguinal hernia repair. No evidence of omental or peritoneal disease. Musculoskeletal: Tiny right iliac tiny sclerotic lesion is unchanged, likely a bone island. Lumbosacral spondylosis. IMPRESSION: 1. 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 Increase in moderate right pleural effusion. 2. No thoracic adenopathy or findings of metastatic disease. 3. Similar to further decrease in size/conspicuity of a tiny left lower lobe nodule. 4. No acute process or evidence of metastatic disease in the abdomen or pelvis. 5.  Possible constipation. 6.  Aortic  Atherosclerosis (ICD10-I70.0). Electronically Signed   By: KAbigail MiyamotoM.D.   On: 10/07/2020 16:43   CT Abdomen Pelvis W Contrast  Result Date: 10/07/2020 CLINICAL DATA:  Non-small cell lung cancer. Restaging. On oral chemotherapy. EXAM: CT CHEST, ABDOMEN, AND PELVIS WITH CONTRAST TECHNIQUE: Multidetector CT imaging of the chest, abdomen and pelvis was performed following the standard protocol during bolus administration of intravenous contrast. CONTRAST:  1075mOMNIPAQUE IOHEXOL 300 MG/ML  SOLN COMPARISON:  07/30/2020 FINDINGS: CT CHEST FINDINGS Cardiovascular: Aortic atherosclerosis. Normal heart size, without pericardial effusion. Mediastinum/Nodes: No mediastinal or hilar adenopathy. Lungs/Pleura: Moderate right pleural effusion, increased. No evidence of pleural nodularity or pleural based mass. Redemonstration of right perihilar ground-glass and airspace opacity with architectural distortion and traction bronchiectasis. The tiny left lower lobe ground-glass nodule is similar to less distinct today, including at 2 mm on 117/4. No new pulmonary nodules. Musculoskeletal: No acute osseous abnormality. CT ABDOMEN PELVIS FINDINGS Hepatobiliary: Normal liver. Normal gallbladder, without biliary ductal dilatation. Pancreas: Fatty atrophy involving the pancreatic head and neck. No duct dilatation or acute inflammation. Spleen: Normal in size, without focal abnormality. Adrenals/Urinary Tract: Normal adrenal glands. Too small to characterize upper pole left renal lesion is most likely a cyst. Normal right kidney, without hydronephrosis. Normal urinary bladder. Stomach/Bowel: Normal stomach, without wall thickening. Normal colon, appendix, Colonic stool burden suggests constipation.  Normal terminal ileum and appendix. Normal small bowel. Vascular/Lymphatic: Aortic atherosclerosis. No abdominopelvic adenopathy. Reproductive: Normal prostate. Other: No significant free fluid. Left inguinal hernia contains fat. Prior  right inguinal hernia repair. No evidence of omental or peritoneal disease. Musculoskeletal: Tiny right iliac tiny sclerotic lesion is unchanged, likely a bone island. Lumbosacral spondylosis. IMPRESSION: 1. 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 Increase in moderate right pleural effusion. 2. No thoracic adenopathy or findings of metastatic disease. 3. Similar to further decrease in size/conspicuity of a tiny left lower lobe nodule. 4. No acute process or evidence of metastatic disease in the abdomen or pelvis. 5.  Possible constipation. 6.  Aortic Atherosclerosis (ICD10-I70.0). Electronically Signed   By: Abigail Miyamoto M.D.   On: 10/07/2020 16:43    ASSESSMENT AND PLAN: This is a very pleasant 60 years old white male with metastatic non-small cell lung cancer, adenocarcinoma now with ALK gene translocation.  The patient was initially diagnosed as a stage IIIa non-small cell lung cancer status post a course of concurrent chemoradiation with weekly carboplatin and paclitaxel followed by 1 year of consolidation treatment with immunotherapy with Imfinzi. He had evidence for disease progression recently. Repeat tissue biopsy from the left supraclavicular lymph nodes and molecular studies showed that the patient has positive ALK gene translocation.  His previous molecular studies by [employer] was negative. I had a lengthy discussion with the patient today about his condition and treatment options. He received 1 cycle of systemic chemotherapy with carboplatin, Alimta and Keytruda.  We will discontinue his systemic chemotherapy for now because of the new findings on the molecular studies. The patient is currently on treatment with [name] 600 mg p.o. twice daily.  He is status post 7 months of treatment.   The patient has been tolerating this treatment well with no concerning adverse effects. He had repeat CT scan of the chest, abdomen pelvis performed recently.  I personally  and independently reviewed the scan images and discussed the results with the patient and his wife. His scan showed no concerning findings for disease progression except for the mildly enlarged right pleural effusion. I recommended for the patient to continue his current treatment with [name] with the same dose. For the right pleural effusion, I gave him the option of proceeding with ultrasound-guided right thoracentesis but the patient would like to wait until he has symptoms. For the hypothyroidism, he will continue his current treatment with levothyroxine. For the pulmonary embolism, he would like to switch his treatment to oral drug.  I will discontinue Lovenox and switch the patient to Eliquis 5 mg p.o. twice daily. He will come back for follow-up visit in around 6 weeks for evaluation and repeat blood work. He was advised to call immediately if he has any concerning symptoms in the interval.  The patient voices understanding of current disease status and treatment options and is in agreement with the current care plan.  All questions were answered. The patient knows to call the clinic with any problems, questions or concerns. We can certainly see the patient much sooner if necessary.  Disclaimer: This note was dictated with voice recognition software. Similar sounding words can inadvertently be transcribed and may not be corrected upon review.

## 2020-10-09 NOTE — Telephone Encounter (Signed)
Left patient a voicemail message in regards to telephone visit on 10/14/20 @ 1:30pm. Called to review meaningful use questions. TM

## 2020-10-09 NOTE — Telephone Encounter (Signed)
Spoke with patient in regards to telephone visit with Shona Simpson PA on 10/14/20 @ 1:30pm. Patient verbalized understanding of appointment date and time. Reviewed meaningful use questions. TM

## 2020-10-10 ENCOUNTER — Ambulatory Visit
Admission: RE | Admit: 2020-10-10 | Discharge: 2020-10-10 | Disposition: A | Discharge status: Home / Self Care | Payer: BC Managed Care – PPO | Source: Ambulatory Visit | Attending: Radiation Oncology | Admitting: Radiation Oncology

## 2020-10-10 ENCOUNTER — Telehealth: Payer: Self-pay | Admitting: Internal Medicine

## 2020-10-10 DIAGNOSIS — I739 Peripheral vascular disease, unspecified: Secondary | ICD-10-CM | POA: Diagnosis not present

## 2020-10-10 DIAGNOSIS — C7931 Secondary malignant neoplasm of brain: Secondary | ICD-10-CM

## 2020-10-10 DIAGNOSIS — I6389 Other cerebral infarction: Secondary | ICD-10-CM | POA: Diagnosis not present

## 2020-10-10 DIAGNOSIS — C7949 Secondary malignant neoplasm of other parts of nervous system: Secondary | ICD-10-CM | POA: Diagnosis not present

## 2020-10-10 MED ORDER — GADOBENATE DIMEGLUMINE 529 MG/ML IV SOLN
15.0000 mL | Freq: Once | INTRAVENOUS | Status: AC | PRN
  Administered 2020-10-10: 15 mL via INTRAVENOUS

## 2020-10-10 NOTE — Telephone Encounter (Signed)
Scheduled per los. Called and left msg. Mailed printout  °

## 2020-10-14 ENCOUNTER — Ambulatory Visit
Admission: RE | Admit: 2020-10-14 | Discharge: 2020-10-14 | Disposition: A | Discharge status: Home / Self Care | Payer: BC Managed Care – PPO | Source: Ambulatory Visit | Attending: Radiation Oncology | Admitting: Radiation Oncology

## 2020-10-14 ENCOUNTER — Other Ambulatory Visit: Payer: Self-pay

## 2020-10-14 ENCOUNTER — Encounter: Payer: Self-pay | Admitting: Internal Medicine

## 2020-10-14 ENCOUNTER — Inpatient Hospital Stay: Payer: BC Managed Care – PPO

## 2020-10-14 DIAGNOSIS — C7931 Secondary malignant neoplasm of brain: Secondary | ICD-10-CM | POA: Diagnosis not present

## 2020-10-14 DIAGNOSIS — C7949 Secondary malignant neoplasm of other parts of nervous system: Secondary | ICD-10-CM

## 2020-10-14 DIAGNOSIS — C3411 Malignant neoplasm of upper lobe, right bronchus or lung: Secondary | ICD-10-CM | POA: Diagnosis not present

## 2020-10-14 DIAGNOSIS — Z08 Encounter for follow-up examination after completed treatment for malignant neoplasm: Secondary | ICD-10-CM | POA: Diagnosis not present

## 2020-10-14 NOTE — Progress Notes (Signed)
Radiation Oncology         [phone] ________________________________  Outpatient Follow Up - Conducted via telephone due to current COVID-19 concerns for limiting patient exposure  I spoke with the patient to conduct this consult visit via telephone to spare the patient unnecessary potential exposure in the healthcare setting during the current COVID-19 pandemic. The patient was notified in advance and was offered a [place] meeting to allow for face to face communication but unfortunately reported that they did not have the appropriate resources/technology to support such a visit and instead preferred to proceed with a telephone visit.    Name: Brian [name]        MRN: [id number]  Date of Service: 10/14/2020 DOB: Jul 15, 1961  AG:TXMIWOE, [name], MD  [name], MD     REFERRING PHYSICIAN: [name], MD   DIAGNOSIS: The encounter diagnosis was Secondary malignant neoplasm of brain and spinal cord [facility]).   HISTORY OF PRESENT ILLNESS: Brian [name] is a 60 y.o. male with a history of Stage IIIB, cT1cN3M0, NSCLC, adenocarcinoma in January 2020.  He was treated with chemoradiation under the care of Dr. Julien Nordmann and Dr. Sondra Come.  He was found to have progressive disease in the summer 2020 in the right cervical lymph node chain and was treated with definitive radiotherapy.  He received consolidative immunotherapy.  Unfortunately recent imaging showed recurrent disease in the left cervical lymph nodes, and biopsy on 02/16/2020 confirmed the diagnosis.  He was ready to proceed with systemic chemotherapy, and an MRI brain on 02/21/20 for restaging revealed a 5 mm right lateral temporal lobe metastasis. A 3T MRI scan of the brain with and without contrast on 03/01/2020 showed the 5 mm metastasis in the right lateral temporal lobe, a 2 mm metastasis in the left posterior frontal cortex, and a 3 mm metastasis along the undersurface of the right thalamus.  Small vessel ischemic changes  were also noted within the hemispheric white matter and were stable. He met with our team and Dr. Lisbeth Renshaw offered [facility] treatment to these three lesions. The patient was prepared to proceed, but couldn't tolerate the mask for simulation and declined to proceed. We reviewed the rationale to repeat imaging, and this was performed with MRI brain on 05/02/20 which showed no visible lesions of concern that had been seen prior. Again he recently underwent repeat scan 6 months later which was performed on 10/07/20 that again did not show evidence of disease. 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 He's contacted today to review this information.   PREVIOUS RADIATION THERAPY: Yes  03/06/2019-04/13/2019: The right cervical node chain was treated to 60 Gy in 30 fractions  09/05/2018-10/14/2018:   The right lung target was treated to 60 Gy in 30 fractions.   PAST MEDICAL HISTORY:  Past Medical History:  Diagnosis Date  . COPD (chronic obstructive pulmonary disease) ([place])   . Diabetes mellitus without complication ([place])   . Hypertension   . nscl ca dx'd 08/2018  . Pneumonia   . Subarachnoid hemorrhage ([place])        PAST SURGICAL HISTORY: Past Surgical History:  Procedure Laterality Date  . INGUINAL HERNIA REPAIR Right 06/24/2020   Procedure: RIGHT OPEN INGUINAL HERNIA REPAIR WITH MESH;  Surgeon: Kinsinger, Arta Bruce, MD;  Location: [facility] ORS;  Service: General;  Laterality: Right;  . IR IVC FILTER PLMT / S&I Burke Keels GUID/MOD SED  03/04/2020  . IR IVC FILTER RETRIEVAL / S&I /IMG GUID/MOD SED  08/15/2020  .  IR RADIOLOGIST EVAL & MGMT  08/06/2020  . LEG SURGERY  age 58    left leg, from fracture     FAMILY HISTORY:  Family History  Problem Relation Age of Onset  . Colon cancer Neg Hx   . Stomach cancer Neg Hx      SOCIAL HISTORY:  reports that he has never smoked. He has never used smokeless tobacco. He reports current alcohol use of about 5.0 standard drinks of alcohol per week. He reports that he does  not use drugs.  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   ALLERGIES: Patient has no known allergies.   MEDICATIONS:  Current Outpatient Medications  Medication Sig Dispense Refill  . albuterol (VENTOLIN HFA) 108 (90 Base) MCG/ACT inhaler Inhale 2 puffs into the lungs every 4 (four) hours as needed for wheezing or shortness of breath. 6.7 g 5  . [name] 150 MG capsule TAKE 4 CAPSULES BY MOUTH TWICE DAILY WITH FOOD. SWALLOW CAPSULES WHOLE. DO NOT OPEN OR CRUSH. STORE IN ORIGINAL CONTAINER. (Patient taking differently: Take 600 mg by mouth 2 (two) times daily with a meal.) 240 capsule 2  . amLODipine (NORVASC) 5 MG tablet Take 5 mg by mouth daily.     Marland Kitchen apixaban (ELIQUIS) 5 MG TABS tablet Take 1 tablet (5 mg total) by mouth 2 (two) times daily. 60 tablet 2  . atorvastatin (LIPITOR) 20 MG tablet Take 20 mg by mouth daily.     . B Complex Vitamins (B COMPLEX PO) Take 1 tablet by mouth daily.    Marland Kitchen levothyroxine (SYNTHROID) 100 MCG tablet Take 1 tablet (100 mcg total) by mouth daily before breakfast. 90 tablet 1  . magnesium hydroxide (MILK OF MAGNESIA) 400 MG/5ML suspension Take 15 mLs by mouth daily as needed for mild constipation.    . metFORMIN (GLUCOPHAGE) 500 MG tablet Take 500 mg by mouth daily with breakfast.     . Multiple Vitamin (MULTIVITAMIN) tablet Take 1 tablet by mouth daily.    Marland Kitchen Respiratory Therapy Supplies (FLUTTER) DEVI Please use up 10 times daily(4-5 breaths, 4-5 times daily) 1 each 0  . TRELEGY ELLIPTA 100-62.5-25 MCG/INH AEPB Inhale 1 puff into the lungs daily.    Marland Kitchen venlafaxine (EFFEXOR) 37.5 MG tablet Take 37.5 mg by mouth daily.      No current facility-administered medications for this encounter.     REVIEW OF SYSTEMS: The patient states he is feeling quite well without any specific complaints. I reviewed that we would want to hear from him if he developed headaches,  changes in hearing, vision, speech, or movement.     PHYSICAL EXAM:  Unable to assess due to encounter type.  ECOG = 0  0 - Asymptomatic (Fully active, able to carry on all predisease activities without restriction)  1 - Symptomatic but completely ambulatory (Restricted in physically strenuous activity but ambulatory and able to carry out work of a light or sedentary nature. For example, light housework, office work)  2 - Symptomatic, <50% in bed during the day (Ambulatory and capable of all self care but unable to carry out any work activities. Up and about more than 50% of waking hours)  3 - Symptomatic, >50% in bed, but not bedbound (Capable of only limited self-care, confined to bed or chair 50% or more of waking hours)  4 - Bedbound (Completely disabled. Cannot carry on any self-care.  Totally confined to bed or chair)  5 - Death   Eustace Pen MM, Creech RH, Tormey DC, et al. [phone]). "Toxicity and response criteria of the [facility]". [place] Oncol. 5 (6): 649-55    LABORATORY DATA:  Lab Results  Component Value Date   WBC 11.4 (H) 10/07/2020   HGB 13.1 10/07/2020   HCT 38.5 (L) 10/07/2020   MCV 93.4 10/07/2020   PLT 284 10/07/2020   Lab Results  Component Value Date   NA 138 10/07/2020   K 4.1 10/07/2020   CL 103 10/07/2020   CO2 27 10/07/2020   Lab Results  Component Value Date   ALT 29 10/07/2020   AST 27 10/07/2020   ALKPHOS 147 (H) 10/07/2020   BILITOT 0.9 10/07/2020      RADIOGRAPHY: CT Chest W Contrast  Result Date: 10/07/2020 CLINICAL DATA:  Non-small cell lung cancer. Restaging. On oral chemotherapy. EXAM: CT CHEST, ABDOMEN, AND PELVIS WITH CONTRAST TECHNIQUE: Multidetector CT imaging of the chest, abdomen and pelvis was performed following the standard protocol during bolus administration of intravenous contrast. CONTRAST:  166m OMNIPAQUE IOHEXOL 300 MG/ML  SOLN COMPARISON:  07/30/2020 FINDINGS: CT CHEST FINDINGS Cardiovascular:  Aortic atherosclerosis. Normal heart size, without pericardial effusion. Mediastinum/Nodes: No mediastinal or hilar adenopathy. Lungs/Pleura: Moderate right pleural effusion, increased. No evidence of pleural nodularity or pleural based mass. Redemonstration of right perihilar ground-glass and airspace opacity with architectural distortion and traction bronchiectasis. The tiny left lower lobe ground-glass nodule is similar to less distinct today, including at 2 mm on 117/4. No new pulmonary nodules. Musculoskeletal: No acute osseous abnormality. CT ABDOMEN PELVIS FINDINGS Hepatobiliary: Normal liver. Normal gallbladder, without biliary ductal dilatation. Pancreas: Fatty atrophy involving the pancreatic head and neck. No duct dilatation or acute inflammation. Spleen: Normal in size, without focal abnormality. Adrenals/Urinary Tract: Normal adrenal glands. Too small to characterize upper pole left renal lesion is most likely a cyst. Normal right kidney, without hydronephrosis. Normal urinary bladder. Stomach/Bowel: Normal stomach, without wall thickening. Normal colon, appendix, Colonic stool burden suggests constipation. Normal terminal ileum and appendix. Normal small bowel. Vascular/Lymphatic: Aortic atherosclerosis. No abdominopelvic adenopathy. Reproductive: Normal prostate. Other: No significant free fluid. Left inguinal hernia contains fat. Prior right inguinal hernia repair. No evidence of omental or peritoneal disease. Musculoskeletal: Tiny right iliac tiny sclerotic lesion is unchanged, likely a bone island. Lumbosacral spondylosis. IMPRESSION: 1. 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 Increase in moderate right pleural effusion. 2. No thoracic adenopathy or findings of metastatic disease. 3. Similar to further decrease in size/conspicuity of a tiny left lower lobe nodule. 4. No acute process or evidence of metastatic disease in the abdomen or pelvis. 5.  Possible constipation.  6.  Aortic Atherosclerosis (ICD10-I70.0). Electronically Signed   By: KAbigail MiyamotoM.D.   On: 10/07/2020 16:43   MR Brain W Wo Contrast  Result Date: 10/10/2020 CLINICAL DATA:  Secondary malignant neoplasm of brain and spinal cord. Brain/CNS neoplasm, surveillance. Continued surveillance of untreated metastatic brain disease. EXAM: MRI HEAD WITHOUT AND WITH CONTRAST TECHNIQUE: Multiplanar, multiecho pulse sequences of the brain and surrounding structures were obtained without and with intravenous contrast. CONTRAST:  142mMULTIHANCE GADOBENATE DIMEGLUMINE 529 MG/ML IV SOLN COMPARISON:  MRI of the brain May 02, 2020. FINDINGS: Brain: No acute infarction, hemorrhage, hydrocephalus, extra-axial collection or mass lesion. Remote lacunar infarct in the right cerebellar hemisphere. Scattered foci of T2 hyperintensity are seen within the white matter of the cerebral hemispheres, nonspecific, most  likely related to mild chronic microangiopathy, unchanged. No focus of abnormal contrast enhancement to suggest metastatic disease to the brain. Vascular: Normal flow voids. Skull and upper cervical spine: Normal marrow signal. Sinuses/Orbits: Negative. IMPRESSION: 1. No evidence of intracranial metastatic disease. 2. Stable mild chronic microangiopathy. Electronically Signed   By: Pedro Earls M.D.   On: 10/10/2020 16:41   CT Abdomen Pelvis W Contrast  Result Date: 10/07/2020 CLINICAL DATA:  Non-small cell lung cancer. Restaging. On oral chemotherapy. EXAM: CT CHEST, ABDOMEN, AND PELVIS WITH CONTRAST TECHNIQUE: Multidetector CT imaging of the chest, abdomen and pelvis was performed following the standard protocol during bolus administration of intravenous contrast. CONTRAST:  157m OMNIPAQUE IOHEXOL 300 MG/ML  SOLN COMPARISON:  07/30/2020 FINDINGS: CT CHEST FINDINGS Cardiovascular: Aortic atherosclerosis. Normal heart size, without pericardial effusion. Mediastinum/Nodes: No mediastinal or hilar  adenopathy. Lungs/Pleura: Moderate right pleural effusion, increased. No evidence of pleural nodularity or pleural based mass. Redemonstration of right perihilar ground-glass and airspace opacity with architectural distortion and traction bronchiectasis. The tiny left lower lobe ground-glass nodule is similar to less distinct today, including at 2 mm on 117/4. No new pulmonary nodules. Musculoskeletal: No acute osseous abnormality. CT ABDOMEN PELVIS FINDINGS Hepatobiliary: Normal liver. Normal gallbladder, without biliary ductal dilatation. Pancreas: Fatty atrophy involving the pancreatic head and neck. No duct dilatation or acute inflammation. Spleen: Normal in size, without focal abnormality. Adrenals/Urinary Tract: Normal adrenal glands. Too small to characterize upper pole left renal lesion is most likely a cyst. Normal right kidney, without hydronephrosis. Normal urinary bladder. Stomach/Bowel: Normal stomach, without wall thickening. Normal colon, appendix, Colonic stool burden suggests constipation. Normal terminal ileum and appendix. Normal small bowel. Vascular/Lymphatic: Aortic atherosclerosis. No abdominopelvic adenopathy. Reproductive: Normal prostate. Other: No significant free fluid. Left inguinal hernia contains fat. Prior right inguinal hernia repair. No evidence of omental or peritoneal disease. Musculoskeletal: Tiny right iliac tiny sclerotic lesion is unchanged, likely a bone island. Lumbosacral spondylosis. IMPRESSION: 1. 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 Increase in moderate right pleural effusion. 2. No thoracic adenopathy or findings of metastatic disease. 3. Similar to further decrease in size/conspicuity of a tiny left lower lobe nodule. 4. No acute process or evidence of metastatic disease in the abdomen or pelvis. 5.  Possible constipation. 6.  Aortic Atherosclerosis (ICD10-I70.0). Electronically Signed   By: KAbigail MiyamotoM.D.   On: 10/07/2020 16:43        IMPRESSION/PLAN: 1. Progressive metastatic non-small cell lung cancer, adenocarcinoma originally in the right lung with prior concern for brain metastases. Thankfully the patient's case was again discussed in our brain oncology conference and all were in agreement with the original report that there did not appear to be any evidence of disease. The recommendations that came from brain oncology conference were to proceed with a repeat MRI of the brain in 12 months time, or sooner if symptoms of concern were noted. We will follow his course expectantly otherwise as he continues under Dr. MWorthy Flankcare. 2. Radiation recall rash. Clinically this has remained stable. We will follow this expectantly. 3. Claustrophobia. The patient will take  Ativan  prior to  MRI scans. He is in agreement with this plan and will receive a new prescription closer to the time of his next MRI.   Given current concerns for patient exposure during the COVID-19 pandemic, this encounter was conducted via telephone.  The patient has provided two factor identification and has given verbal consent for this type of encounter  and has been advised to only accept a meeting of this type in a secure network environment. The time spent during this encounter was 30 minutes including preparation, discussion, and coordination of the patient's care. The attendants for this meeting include  Hayden Pedro  and Danella Sensing.  During the encounter, Hayden Pedro was located at [facility] Radiation Oncology Department.  Yecheskel Kurek was located at home.     Carola Rhine, PAC

## 2020-10-14 NOTE — Addendum Note (Signed)
Encounter addended by: Hayden Pedro, PA-C on: 10/14/2020 10:38 AM  Actions taken: Level of Service modified

## 2020-10-16 ENCOUNTER — Telehealth: Payer: Self-pay | Admitting: Medical Oncology

## 2020-10-16 NOTE — Telephone Encounter (Signed)
[employer] needs prior auth per pharmacy. Roz notified and will send in [place] . I LVM for pt to call pharmacy to see if it is ready for pick up.

## 2020-10-17 ENCOUNTER — Telehealth: Payer: Self-pay | Admitting: Medical Oncology

## 2020-10-17 NOTE — Telephone Encounter (Signed)
[employer] auth for 10/16/20-10/16/2021.

## 2020-10-24 ENCOUNTER — Ambulatory Visit (INDEPENDENT_AMBULATORY_CARE_PROVIDER_SITE_OTHER): Payer: BC Managed Care – PPO | Admitting: Endocrinology

## 2020-10-24 ENCOUNTER — Encounter: Payer: Self-pay | Admitting: Endocrinology

## 2020-10-24 ENCOUNTER — Other Ambulatory Visit: Payer: Self-pay

## 2020-10-24 DIAGNOSIS — E039 Hypothyroidism, unspecified: Secondary | ICD-10-CM

## 2020-10-24 NOTE — Progress Notes (Signed)
Subjective:    Patient ID: Brian [name], male    DOB: 1960-09-03, 60 y.o.   MRN: [id number]  HPI Pt is referred by Dr Julien Nordmann, for hypothyroidism.  Pt reports hypothyroidism was dx'ed in 2020.  He has been on prescribed thyroid hormone therapy since dx.  He has never taken kelp or any other type of non-prescribed thyroid product.  He has never had dedicated thyroid imaging.  He has never had thyroid surgery, or XRT to the neck.  He has never been on amiodarone or lithium.  He reports slight swelling at the ant neck.  He has regained the weight he lost. He has numbness of the feet. Past Medical History:  Diagnosis Date  . COPD (chronic obstructive pulmonary disease) ([place])   . Diabetes mellitus without complication ([place])   . Hypertension   . nscl ca dx'd 08/2018  . Pneumonia   . Subarachnoid hemorrhage [facility])     Past Surgical History:  Procedure Laterality Date  . INGUINAL HERNIA REPAIR Right 06/24/2020   Procedure: RIGHT OPEN INGUINAL HERNIA REPAIR WITH MESH;  Surgeon: Kinsinger, Arta Bruce, MD;  Location: [facility];  Service: General;  Laterality: Right;  . IR IVC FILTER PLMT / S&I Burke Keels GUID/MOD SED  03/04/2020  . IR IVC FILTER RETRIEVAL / S&I /IMG GUID/MOD SED  08/15/2020  . IR RADIOLOGIST EVAL & MGMT  08/06/2020  . LEG SURGERY  age 14    left leg, from fracture    Social History   Socioeconomic History  . Marital status: Married    Spouse name: Not on file  . Number of children: Not on file  . Years of education: Not on file  . Highest education level: Not on file  Occupational History  . Not on file  Tobacco Use  . Smoking status: Never Smoker  . Smokeless tobacco: Never Used  Vaping Use  . Vaping Use: Never used  Substance and Sexual Activity  . Alcohol use: Yes    Alcohol/week: 5.0 standard drinks    Types: 5 Standard drinks or equivalent per week    Comment: occasionally  . Drug use: No  . Sexual activity: Not on file  Other Topics Concern  . Not on file  Social  History Narrative  . Not on file   Social Determinants of Health   Financial Resource Strain: Not on file  Food Insecurity: Not on file  Transportation Needs: Not on file  Physical Activity: Not on file  Stress: Not on file  Social Connections: Not on file  Intimate Partner Violence: Not on file    Current Outpatient Medications on File Prior to Visit  Medication Sig Dispense Refill  . albuterol (VENTOLIN HFA) 108 (90 Base) MCG/ACT inhaler Inhale 2 puffs into the lungs every 4 (four) hours as needed for wheezing or shortness of breath. 6.7 g 5  . [name] 150 MG capsule TAKE 4 CAPSULES BY MOUTH TWICE DAILY WITH FOOD. SWALLOW CAPSULES WHOLE. DO NOT OPEN OR CRUSH. STORE IN ORIGINAL CONTAINER. (Patient taking differently: Take 600 mg by mouth 2 (two) times daily with a meal.) 240 capsule 2  . amLODipine (NORVASC) 5 MG tablet Take 5 mg by mouth daily.     Marland Kitchen apixaban (ELIQUIS) 5 MG TABS tablet Take 1 tablet (5 mg total) by mouth 2 (two) times daily. 60 tablet 2  . atorvastatin (LIPITOR) 20 MG tablet Take 20 mg by mouth daily.     . B Complex Vitamins (B COMPLEX PO) Take  1 tablet by mouth daily.    Marland Kitchen levothyroxine (SYNTHROID) 100 MCG tablet Take 1 tablet (100 mcg total) by mouth daily before breakfast. 90 tablet 1  . metFORMIN (GLUCOPHAGE) 500 MG tablet Take 500 mg by mouth daily with breakfast.     . Multiple Vitamin (MULTIVITAMIN) tablet Take 1 tablet by mouth daily.    . TRELEGY ELLIPTA 100-62.5-25 MCG/INH AEPB Inhale 1 puff into the lungs daily.    Marland Kitchen venlafaxine (EFFEXOR) 37.5 MG tablet Take 37.5 mg by mouth daily.     . magnesium hydroxide (MILK OF MAGNESIA) 400 MG/5ML suspension Take 15 mLs by mouth daily as needed for mild constipation. (Patient not taking: Reported on 10/24/2020)    . Respiratory Therapy Supplies (FLUTTER) DEVI Please use up 10 times daily(4-5 breaths, 4-5 times daily) 1 each 0   No current facility-administered medications on file prior to visit.    No Known  Allergies  Family History  Problem Relation Age of Onset  . Colon cancer Neg Hx   . Stomach cancer Neg Hx   . Thyroid cancer Neg Hx     BP 116/68 (BP Location: Left Arm, Patient Position: Sitting, Cuff Size: Normal)   Pulse 74   Resp 20   Ht 5\' 8"  (1.727 m)   Wt 183 lb 12.8 oz (83.4 kg)   SpO2 96%   BMI 27.95 kg/m     Review of Systems denies memory loss, constipation, cold intolerance, and dry skin.       Objective:   Physical Exam VITAL SIGNS:  See vs page GENERAL: no distress NECK: There is no palpable thyroid enlargement.  No thyroid nodule is palpable.  No palpable lymphadenopathy at the anterior neck.   Lab Results  Component Value Date   TSH 3.713 07/04/2020   Chest CT (2022): no mention is made of the thyroid Neck CT (2021): thyroid normal.  No pathologically enlarged nodes. Nonspecific slight increase in size of some left level 2 and left supraclavicular nodes.    I have reviewed outside records, and summarized: Pt was noted to have elevated TSH, and referred here.  XRT was administered to the right lat neck      Assessment & Plan:  Chronic primary hypothyroidism: well-replaced. Neck swelling, new to me, uncertain etiology and prognosis   Patient Instructions  Please continue the same levothyroxine, and redo the blood test when you see Dr Julien Nordmann next month. Let's recheck the ultrasound.  you will receive a phone call, about a day and time for an appointment.   Please come back for a follow-up appointment in 6 weeks.

## 2020-10-24 NOTE — Patient Instructions (Addendum)
Please continue the same levothyroxine, and redo the blood test when you see Dr Julien Nordmann next month. Let's recheck the ultrasound.  you will receive a phone call, about a day and time for an appointment.   Please come back for a follow-up appointment in 6 weeks.

## 2020-10-29 ENCOUNTER — Ambulatory Visit
Admission: RE | Admit: 2020-10-29 | Discharge: 2020-10-29 | Disposition: A | Discharge status: Home / Self Care | Payer: BC Managed Care – PPO | Source: Ambulatory Visit | Attending: Endocrinology | Admitting: Endocrinology

## 2020-10-29 DIAGNOSIS — E039 Hypothyroidism, unspecified: Secondary | ICD-10-CM

## 2020-11-05 ENCOUNTER — Encounter: Payer: Self-pay | Admitting: Internal Medicine

## 2020-11-06 ENCOUNTER — Encounter: Payer: Self-pay | Admitting: Internal Medicine

## 2020-11-06 ENCOUNTER — Other Ambulatory Visit: Payer: Self-pay | Admitting: Physician Assistant

## 2020-11-06 DIAGNOSIS — J90 Pleural effusion, not elsewhere classified: Secondary | ICD-10-CM

## 2020-11-07 ENCOUNTER — Other Ambulatory Visit: Payer: Self-pay | Admitting: Internal Medicine

## 2020-11-07 ENCOUNTER — Other Ambulatory Visit: Payer: Self-pay | Admitting: Pulmonary Disease

## 2020-11-07 DIAGNOSIS — C3491 Malignant neoplasm of unspecified part of right bronchus or lung: Secondary | ICD-10-CM

## 2020-11-13 ENCOUNTER — Ambulatory Visit (HOSPITAL_COMMUNITY)
Admission: RE | Admit: 2020-11-13 | Discharge: 2020-11-13 | Disposition: A | Discharge status: Home / Self Care | Payer: BC Managed Care – PPO | Source: Ambulatory Visit | Attending: Physician Assistant | Admitting: Physician Assistant

## 2020-11-13 DIAGNOSIS — Z20822 Contact with and (suspected) exposure to covid-19: Secondary | ICD-10-CM | POA: Insufficient documentation

## 2020-11-13 DIAGNOSIS — Z01812 Encounter for preprocedural laboratory examination: Secondary | ICD-10-CM | POA: Insufficient documentation

## 2020-11-13 LAB — SARS CORONAVIRUS 2 (TAT 6-24 HRS): SARS Coronavirus 2: NEGATIVE

## 2020-11-15 ENCOUNTER — Ambulatory Visit (HOSPITAL_COMMUNITY)
Admission: RE | Admit: 2020-11-15 | Discharge: 2020-11-15 | Disposition: A | Discharge status: Home / Self Care | Payer: BC Managed Care – PPO | Source: Ambulatory Visit | Attending: Physician Assistant | Admitting: Physician Assistant

## 2020-11-15 ENCOUNTER — Ambulatory Visit (HOSPITAL_COMMUNITY)
Admission: RE | Admit: 2020-11-15 | Discharge: 2020-11-15 | Disposition: A | Discharge status: Home / Self Care | Payer: BC Managed Care – PPO | Source: Ambulatory Visit | Attending: Radiology | Admitting: Radiology

## 2020-11-15 ENCOUNTER — Other Ambulatory Visit: Payer: Self-pay

## 2020-11-15 DIAGNOSIS — Z9889 Other specified postprocedural states: Secondary | ICD-10-CM

## 2020-11-15 DIAGNOSIS — J90 Pleural effusion, not elsewhere classified: Secondary | ICD-10-CM | POA: Diagnosis not present

## 2020-11-15 DIAGNOSIS — C3491 Malignant neoplasm of unspecified part of right bronchus or lung: Secondary | ICD-10-CM | POA: Diagnosis not present

## 2020-11-15 DIAGNOSIS — J9811 Atelectasis: Secondary | ICD-10-CM | POA: Diagnosis not present

## 2020-11-15 DIAGNOSIS — J910 Malignant pleural effusion: Secondary | ICD-10-CM | POA: Diagnosis not present

## 2020-11-15 MED ORDER — LIDOCAINE HCL 1 % IJ SOLN
INTRAMUSCULAR | Status: AC
  Filled 2020-11-15: qty 20

## 2020-11-15 NOTE — Procedures (Signed)
Ultrasound-guided therapeutic right thoracentesis performed yielding 1.3 liters of yellow fluid. No immediate comilications. Follow-up chest x-ray pending. EBL< 3 cc.

## 2020-11-17 IMAGING — PT NM PET TUM IMG INITIAL (PI) SKULL BASE T - THIGH
8 series · 25 of 25 positions shown · non-contrast
Comparison: Chest CT on 07/25/2018

CLINICAL DATA: Initial treatment strategy for right upper lobe
pulmonary nodule.

EXAM:
NUCLEAR MEDICINE PET SKULL BASE TO THIGH
TECHNIQUE: 8.7 mCi F-18 FDG was injected intravenously. Full-ring PET imaging
was performed from the skull base to thigh after the radiotracer. CT
data was obtained and used for attenuation correction and anatomic
localization.
Fasting blood glucose: 98 mg/dl

[Series 3: pet sk_thigh ac · axial · 5.0mm · 4.07mm/px · z∈[-812,+124]mm · 5 of 235 slices shown]
[im 1/235]
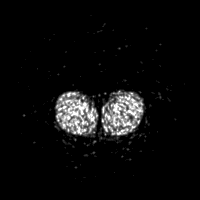
[im 59/235]
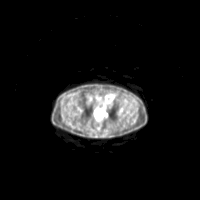
[im 118/235]
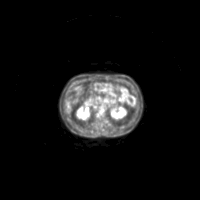
[im 176/235]
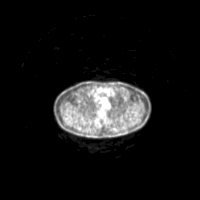
[im 235/235]
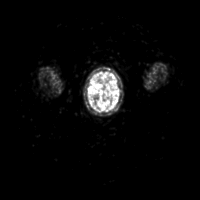

[Series 4: ct sk_thigh 5.0 b31f · axial · 5.0mm · 0.98mm/px · z∈[-812,+124]mm · 5 of 235 slices shown]
[im 1/235]
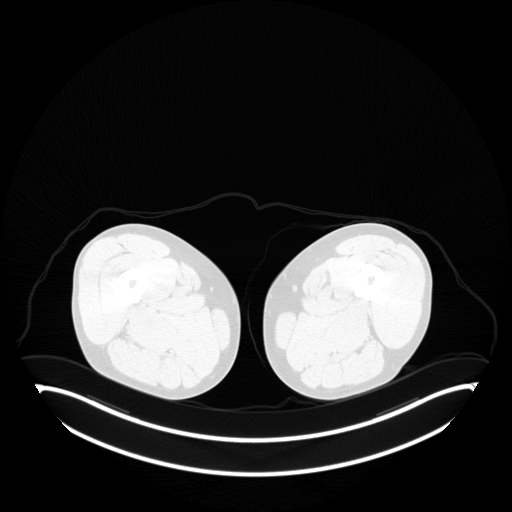
[im 59/235]
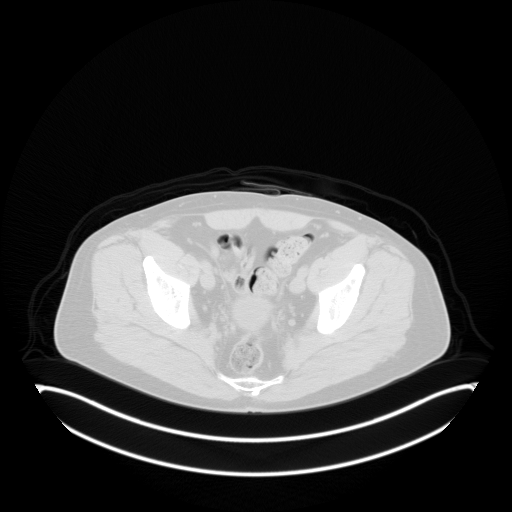
[im 118/235]
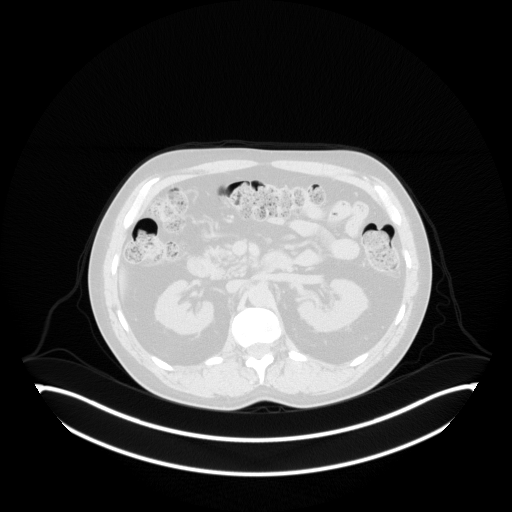
[im 176/235]
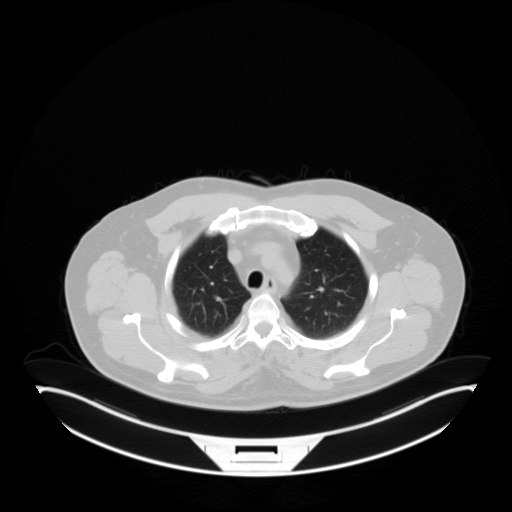
[im 235/235  brain]
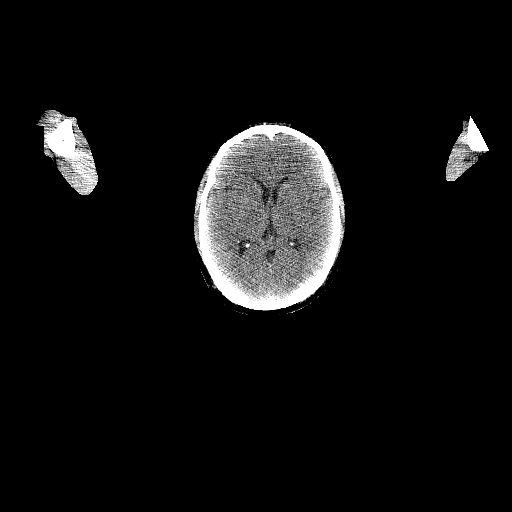

[Series 5: pet sk_thigh nac · axial · 5.0mm · 4.07mm/px · z∈[-812,+124]mm · 5 of 235 slices shown]
[im 1/235]
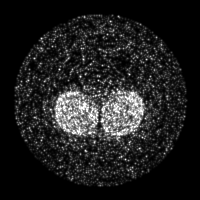
[im 59/235]
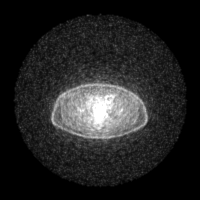
[im 118/235]
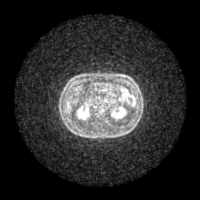
[im 176/235]
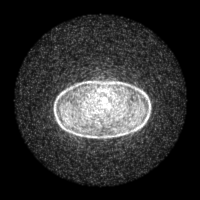
[im 235/235]
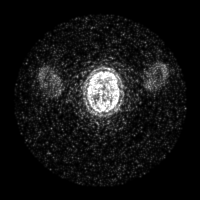

[Series 8: ct sk_thigh 5.0 b70f (id)_bone · axial · 5.0mm · 0.71mm/px · 1 of 59 slices shown]
[im 1/59  bone]
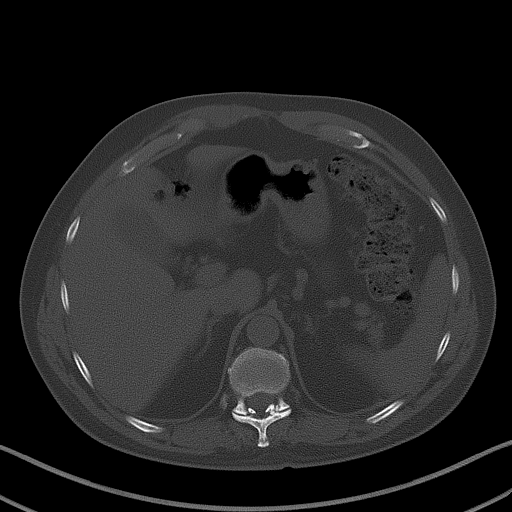

[Series 603: mip range · coronal · 1.94mm/px · 1 of 32 slices shown]
[im 1/32]
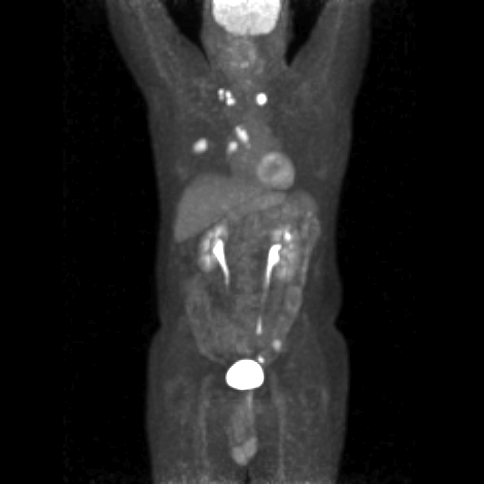

[Series 604: range-ct sk_thigh 5.0 (id)<alpha range> · 2 of 85 slices shown (1 of 2)]
[im 1/85]
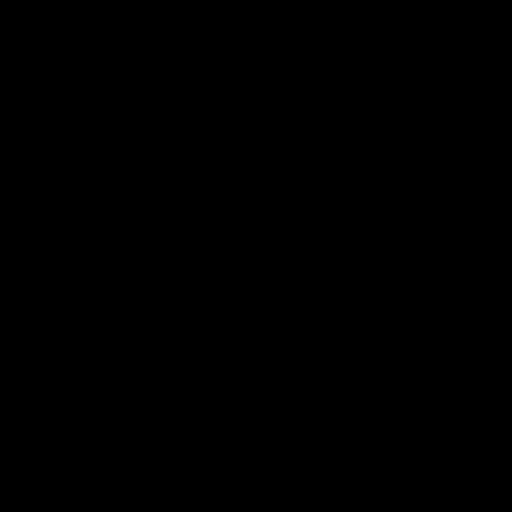
[im 85/85]
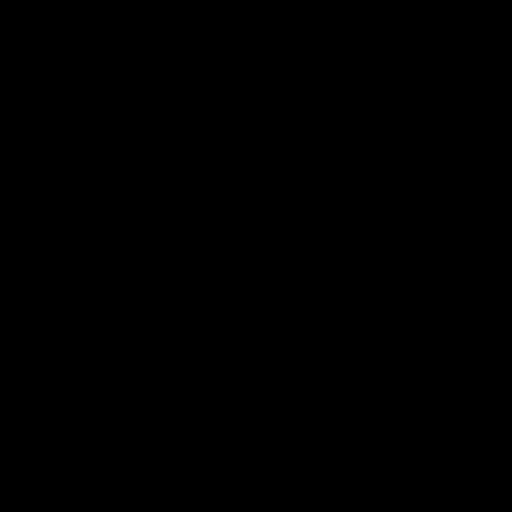

[Series 605: range-ct sk_thigh 5.0 (id)<alpha range> · 5 of 226 slices shown (2 of 2)]
[im 1/226]
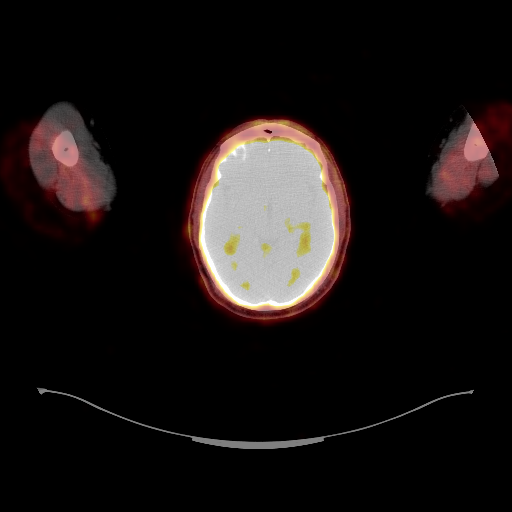
[im 57/226]
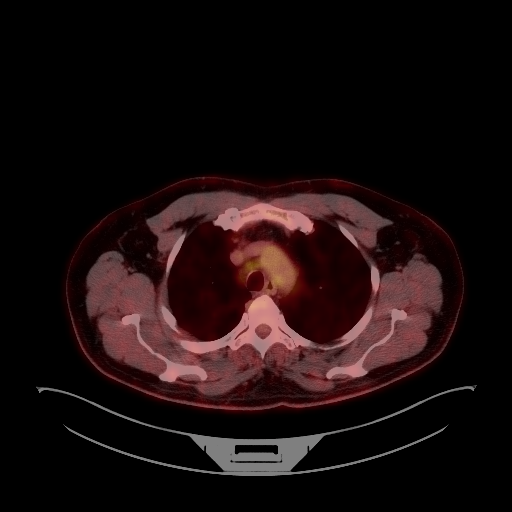
[im 113/226]
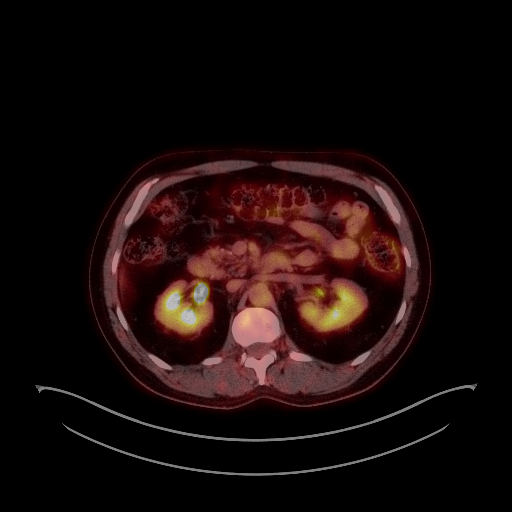
[im 169/226]
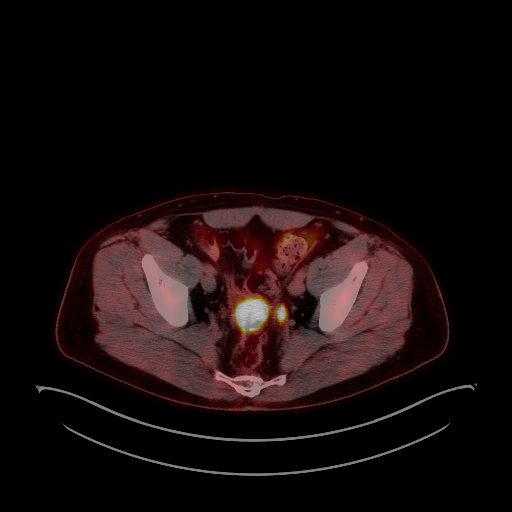
[im 226/226]
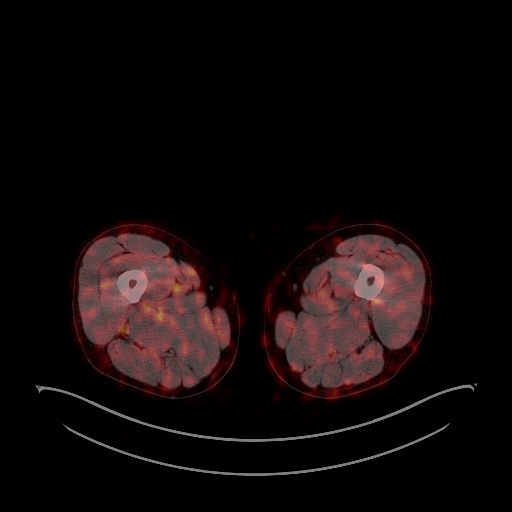

[Series 1070: results mm oncology reading · 1.0mm · 0.89mm/px · 1 of 6 slices shown]
[im 1/6]
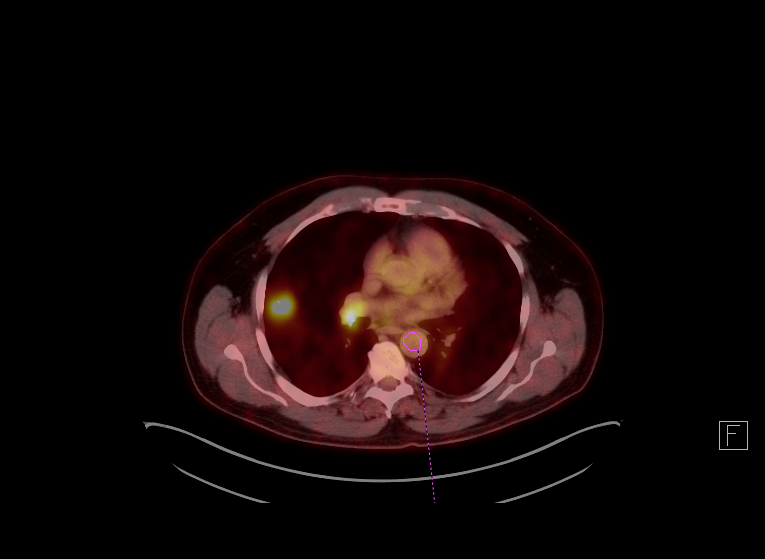

[25 of 25 positions shown; findings below may reference images not displayed]

FINDINGS: (Background mediastinal blood pool activity: SUV max = 2.8)

NECK: 1.5 cm hypermetabolic left supraclavicular lymph node with SUV
max of 34.5. Several right supraclavicular lymph nodes are seen,
largest measuring 11 mm, with SUV max of 21.4.

Incidental CT findings:  None.

CHEST: 2.9 cm microlobulated nodule in the right upper lobe is
hypermetabolic, with SUV max of 13.6. Mild hypermetabolic
lymphadenopathy in the right hilum has SUV max of 13.1.
Hypermetabolic mediastinal lymphadenopathy is seen in right
paratracheal, precarinal, and subcarinal regions, with index lymph
node in the right paratracheal region measuring 12 mm with SUV max
of 15.9. No evidence of pleural effusion.

Incidental CT findings:  None.

ABDOMEN/PELVIS: No abnormal hypermetabolic activity within the
liver, pancreas, adrenal glands, or spleen. No hypermetabolic lymph
nodes in the abdomen or pelvis.

Incidental CT findings:  None.

SKELETON: No focal hypermetabolic bone lesions to suggest skeletal
metastasis.

Incidental CT findings:  None.
IMPRESSION: Hypermetabolic 2.9 cm right upper lobe nodule, consistent with
primary bronchogenic carcinoma.

Hypermetabolic right hilar and ipsilateral mediastinal
lymphadenopathy, consistent with metastatic disease.

Hypermetabolic bilateral supraclavicular lymphadenopathy, consistent
with metastatic disease.

No evidence of abdominal or pelvic metastatic disease.

## 2020-11-20 ENCOUNTER — Inpatient Hospital Stay: Payer: BC Managed Care – PPO

## 2020-11-20 ENCOUNTER — Encounter: Payer: Self-pay | Admitting: Internal Medicine

## 2020-11-20 ENCOUNTER — Inpatient Hospital Stay: Payer: BC Managed Care – PPO | Attending: Internal Medicine | Admitting: Internal Medicine

## 2020-11-20 ENCOUNTER — Other Ambulatory Visit: Payer: Self-pay

## 2020-11-20 DIAGNOSIS — E039 Hypothyroidism, unspecified: Secondary | ICD-10-CM | POA: Diagnosis not present

## 2020-11-20 DIAGNOSIS — I2699 Other pulmonary embolism without acute cor pulmonale: Secondary | ICD-10-CM | POA: Insufficient documentation

## 2020-11-20 DIAGNOSIS — C3491 Malignant neoplasm of unspecified part of right bronchus or lung: Secondary | ICD-10-CM

## 2020-11-20 DIAGNOSIS — Z7901 Long term (current) use of anticoagulants: Secondary | ICD-10-CM | POA: Diagnosis not present

## 2020-11-20 DIAGNOSIS — C3490 Malignant neoplasm of unspecified part of unspecified bronchus or lung: Secondary | ICD-10-CM | POA: Diagnosis not present

## 2020-11-20 DIAGNOSIS — C770 Secondary and unspecified malignant neoplasm of lymph nodes of head, face and neck: Secondary | ICD-10-CM | POA: Diagnosis not present

## 2020-11-20 DIAGNOSIS — C3411 Malignant neoplasm of upper lobe, right bronchus or lung: Secondary | ICD-10-CM | POA: Insufficient documentation

## 2020-11-20 LAB — CMP (CANCER CENTER ONLY)
ALT: 33 U/L (ref 0–44)
AST: 25 U/L (ref 15–41)
Albumin: 3.6 g/dL (ref 3.5–5.0)
Alkaline Phosphatase: 134 U/L — ABNORMAL HIGH (ref 38–126)
Anion gap: 9 (ref 5–15)
BUN: 16 mg/dL (ref 6–20)
CO2: 26 mmol/L (ref 22–32)
Calcium: 8.4 mg/dL — ABNORMAL LOW (ref 8.9–10.3)
Chloride: 105 mmol/L (ref 98–111)
Creatinine: 1.03 mg/dL (ref 0.61–1.24)
GFR, Estimated: >60 mL/min (ref >60)
Glucose, Bld: 145 mg/dL — ABNORMAL HIGH (ref 70–99)
Potassium: 4 mmol/L (ref 3.5–5.1)
Sodium: 140 mmol/L (ref 135–145)
Total Bilirubin: 0.7 mg/dL (ref 0.3–1.2)
Total Protein: 6 g/dL — ABNORMAL LOW (ref 6.5–8.1)

## 2020-11-20 LAB — CBC WITH DIFFERENTIAL (CANCER CENTER ONLY)
Abs Immature Granulocytes: 0.15 10*3/uL — ABNORMAL HIGH (ref 0.00–0.07)
Basophils Absolute: 0.1 10*3/uL (ref 0.0–0.1)
Basophils Relative: 1 %
Eosinophils Absolute: 0.3 10*3/uL (ref 0.0–0.5)
Eosinophils Relative: 4 %
HCT: 36.6 % — ABNORMAL LOW (ref 39.0–52.0)
Hemoglobin: 12.4 g/dL — ABNORMAL LOW (ref 13.0–17.0)
Immature Granulocytes: 2 %
Lymphocytes Relative: 12 %
Lymphs Abs: 1.2 10*3/uL (ref 0.7–4.0)
MCH: 31.8 pg (ref 26.0–34.0)
MCHC: 33.9 g/dL (ref 30.0–36.0)
MCV: 93.8 fL (ref 80.0–100.0)
Monocytes Absolute: 1.1 10*3/uL — ABNORMAL HIGH (ref 0.1–1.0)
Monocytes Relative: 12 %
Neutro Abs: 6.7 10*3/uL (ref 1.7–7.7)
Neutrophils Relative %: 69 %
Platelet Count: 291 10*3/uL (ref 150–400)
RBC: 3.9 MIL/uL — ABNORMAL LOW (ref 4.22–5.81)
RDW: 17.6 % — ABNORMAL HIGH (ref 11.5–15.5)
WBC Count: 9.6 10*3/uL (ref 4.0–10.5)
nRBC: 0 % (ref 0.0–0.2)

## 2020-11-20 LAB — CK: Total CK: 149 U/L (ref 49–397)

## 2020-11-20 NOTE — Progress Notes (Signed)
[facility] [phone]   [phone]  OFFICE PROGRESS NOTE  [name], MD [place] [address]  DIAGNOSIS:  Metastatic non-small cell lung cancer initially diagnosed as stage IIIB (T1c, N3, M0)non-small cell lung cancer, adenocarcinoma presented with right upper lobe lung mass in addition to mediastinal and bilateral supraclavicular lymphadenopathy diagnosed in January 2020.  He had evidence of disease recurrence in June 2021 with adenopathy in the subcarinal, level 3 left neck lymph node, and lymph node at the curious of the diaphragm and in the upper abdomen.  PD-L1: 10%  [employer] molecular studiesshowed no actionable mutation  [employer] Testing:  Biomarker Findings Microsatellite status - MS-Stable Tumor Mutational Burden - 4 Muts/Mb Genomic Findings For a complete list of the genes assayed, please refer to the Appendix. ALK EML4-ALK fusion (Variant 2) SMARCB1 R377C CTNNB1 S45P CDKN2A/B CDKN2B loss, CDKN2A loss CXCR4 H417* EY81 splice site 448J>E 7 Disease relevant genes with no reportable alterations: BRAF, EGFR, ERBB2, KRAS, MET, RET, ROS1  PRIOR THERAPY: 1)Concurrent chemoradiation with weekly carboplatin for AUC of 2 and paclitaxel 45 mg/M2.Status post 6 cycles. Last dose was given on October 10, 2018 with stable disease. 2) Radiation treatment to the enlarging right cervical lymph nodes under the care of Dr. Sondra Come. First treatment 03/06/2019. Last treatment scheduledon9/05/2019 3)Consolidation treatment with immunotherapy with Imfinzi 10 mg/KG every 2 weeks. First dose November 17, 2018. Status post26cycles. 4) Systemic chemotherapy with carboplatin for an AUC of 5, Alimta 500 mg/m2, and Keytruda 200 mg IV every 3 weeks. First dose expected on 02/14/20. Status post 1 cycle.  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 5) SBRT to the left lower lobe lung nodule.  CURRENT THERAPY:  [name] ([name]) 600 mg p.o. twice daily.  He started the first dose on March 08, 2020.   Status post 8 months of treatment.  INTERVAL HISTORY: Brian [name] 60 y.o. male returns to the clinic today for follow-up visit.  The patient is feeling fine today with no concerning complaints.  He recently underwent ultrasound-guided right thoracentesis with drainage of 1300 mL of pleural fluid.  He felt a little bit better after the drainage.  He denied having any current chest pain, shortness of breath, cough or hemoptysis.  He denied having any fever or chills.  He has no nausea, vomiting, diarrhea or constipation.  He has no headache or visual changes.  He is here today for evaluation and repeat blood work.  MEDICAL HISTORY: Past Medical History:  Diagnosis Date  . COPD (chronic obstructive pulmonary disease) ([facility])   . Diabetes mellitus without complication ([facility])   . Hypertension   . nscl ca dx'd 08/2018  . Pneumonia   . Subarachnoid hemorrhage ([facility])     ALLERGIES:  has No Known Allergies.  MEDICATIONS:  Current Outpatient Medications  Medication Sig Dispense Refill  . albuterol (VENTOLIN HFA) 108 (90 Base) MCG/ACT inhaler INHALE 2 PUFFS INTO THE LUNGS EVERY 4 (FOUR) HOURS AS NEEDED FOR WHEEZING OR SHORTNESS OF BREATH. 6.7 each 4  . [name] 150 MG capsule TAKE 4 CAPSULES BY MOUTH TWICE DAILY WITH FOOD. SWALLOW CAPSULES WHOLE. DO NOT OPEN OR CRUSH. STORE IN ORIGINAL CONTAINER. 240 capsule 2  . amLODipine (NORVASC) 5 MG tablet Take 5 mg by mouth daily.     Marland Kitchen apixaban (ELIQUIS) 5 MG TABS tablet Take  1 tablet (5 mg total) by mouth 2 (two) times daily. 60 tablet 2  . atorvastatin (LIPITOR) 20 MG tablet Take 20 mg by mouth daily.     . B Complex Vitamins (B COMPLEX PO) Take 1 tablet by mouth daily.    Marland Kitchen levothyroxine (SYNTHROID) 100 MCG tablet Take 1 tablet (100 mcg total) by mouth daily before  breakfast. 90 tablet 1  . magnesium hydroxide (MILK OF MAGNESIA) 400 MG/5ML suspension Take 15 mLs by mouth daily as needed for mild constipation. (Patient not taking: Reported on 10/24/2020)    . metFORMIN (GLUCOPHAGE) 500 MG tablet Take 500 mg by mouth daily with breakfast.     . Multiple Vitamin (MULTIVITAMIN) tablet Take 1 tablet by mouth daily.    Marland Kitchen Respiratory Therapy Supplies (FLUTTER) DEVI Please use up 10 times daily(4-5 breaths, 4-5 times daily) 1 each 0  . TRELEGY ELLIPTA 100-62.5-25 MCG/INH AEPB Inhale 1 puff into the lungs daily.    Marland Kitchen venlafaxine (EFFEXOR) 37.5 MG tablet Take 37.5 mg by mouth daily.      No current facility-administered medications for this visit.    SURGICAL HISTORY:  Past Surgical History:  Procedure Laterality Date  . INGUINAL HERNIA REPAIR Right 06/24/2020   Procedure: RIGHT OPEN INGUINAL HERNIA REPAIR WITH MESH;  Surgeon: Kinsinger, Arta Bruce, MD;  Location: [facility];  Service: General;  Laterality: Right;  . IR IVC FILTER PLMT / S&I Burke Keels GUID/MOD SED  03/04/2020  . IR IVC FILTER RETRIEVAL / S&I /IMG GUID/MOD SED  08/15/2020  . IR RADIOLOGIST EVAL & MGMT  08/06/2020  . LEG SURGERY  age 24    left leg, from fracture    REVIEW OF SYSTEMS:  A comprehensive review of systems was negative.   PHYSICAL EXAMINATION: General appearance: alert, cooperative and no distress Head: Normocephalic, without obvious abnormality, atraumatic Neck: no adenopathy, no JVD, supple, symmetrical, trachea midline and thyroid not enlarged, symmetric, no tenderness/mass/nodules Lymph nodes: Cervical, supraclavicular, and axillary nodes normal. Resp: clear to auscultation bilaterally Back: symmetric, no curvature. ROM normal. No CVA tenderness. Cardio: regular rate and rhythm, S1, S2 normal, no murmur, click, rub or gallop GI: soft, non-tender; bowel sounds normal; no masses,  no organomegaly Extremities: extremities normal, atraumatic, no cyanosis or edema  ECOG PERFORMANCE STATUS: 1  - Symptomatic but completely ambulatory  Blood pressure 109/73, pulse 85, temperature (!) 97.5 F (36.4 C), temperature source Tympanic, resp. rate 18, height '5\' 8"'  (1.727 m), weight 186 lb 3.2 oz (84.5 kg), SpO2 100 %.  LABORATORY DATA: Lab Results  Component Value Date   WBC 9.6 11/20/2020   HGB 12.4 (L) 11/20/2020   HCT 36.6 (L) 11/20/2020   MCV 93.8 11/20/2020   PLT 291 11/20/2020      Chemistry      Component Value Date/Time   NA 140 11/20/2020 1457   K 4.0 11/20/2020 1457   CL 105 11/20/2020 1457   CO2 26 11/20/2020 1457   BUN 16 11/20/2020 1457   CREATININE 1.03 11/20/2020 1457      Component Value Date/Time   CALCIUM 8.4 (L) 11/20/2020 1457   ALKPHOS 134 (H) 11/20/2020 1457   AST 25 11/20/2020 1457   ALT 33 11/20/2020 1457   BILITOT 0.7 11/20/2020 1457       RADIOGRAPHIC STUDIES: DG Chest 1 View  Result Date: 11/15/2020 CLINICAL DATA:  60 year old male status post right thoracentesis. EXAM: CHEST  1 VIEW COMPARISON:  10/07/2020 FINDINGS: The heart size and mediastinal contours are within normal  limits. No evidence of significant pleural effusion. Similar appearing cicatricial atelectasis in the inferior aspect of the right upper lobe. No evidence of pneumothorax. The left lung is clear. The visualized skeletal structures are unremarkable. IMPRESSION: No evidence of pneumothorax after right thoracentesis. No significant residual right pleural effusion. Electronically Signed   By: Ruthann Cancer MD   On: 11/15/2020 14:31   US THYROID  Result Date: 10/29/2020 CLINICAL DATA:  Right anterior neck swelling EXAM: THYROID ULTRASOUND TECHNIQUE: Ultrasound examination of the thyroid gland and adjacent soft tissues was performed. COMPARISON:  None. FINDINGS: Parenchymal Echotexture: Moderately heterogeneous Isthmus: 0.3 cm Right lobe: 2.9 x 1.1 x 0.9 cm Left lobe: 2.0 x 1.0 x 1.2 cm _________________________________________________________ Estimated total number of nodules >/= 1  cm: 0 Number of spongiform nodules >/=  2 cm not described below (TR1): 0 Number of mixed cystic and solid nodules >/= 1.5 cm not described below (TR2): 0 _________________________________________________________ Nodule # 1: Location: Right; inferior Maximum size: 0.7 cm; Other 2 dimensions: 0.7 x 0.6 cm Composition: solid/almost completely solid (2) Echogenicity: hyperechoic (1) Shape: taller-than-wide (3) Margins: smooth (0) Echogenic foci: none (0) ACR TI-RADS total points: 4. ACR TI-RADS risk category: TR4 (4-6 points). ACR TI-RADS recommendations: Given size (<0.9 cm) and appearance, this nodule does NOT meet TI-RADS criteria for biopsy or dedicated follow-up. _________________________________________________________ No discrete sonographic abnormality identified in the right lateral neck in the area of concern. IMPRESSION: Solitary subcentimeter right thyroid nodule does not meet criteria for FNA or imaging follow-up. Thyroid parenchyma is diffusely heterogeneous. No additional discrete thyroid nodule is identified. The above is in keeping with the ACR TI-RADS recommendations - J Am Coll Radiol 2017;14:587-595. Electronically Signed   By: Miachel Roux M.D.   On: 10/29/2020 12:51   US Thoracentesis Asp Pleural space w/IMG guide  Result Date: 11/15/2020 INDICATION: Patient with history of stage IV right lung adenocarcinoma, dyspnea, recurrent malignant right pleural effusion. Request received for therapeutic right thoracentesis EXAM: ULTRASOUND GUIDED THERAPEUTIC RIGHT THORACENTESIS MEDICATIONS: 1% lidocaine to skin and subcutaneous tissue COMPLICATIONS: None immediate. PROCEDURE: An ultrasound guided thoracentesis was thoroughly discussed with the patient and questions answered. The benefits, risks, alternatives and complications were also discussed. The patient understands and wishes to proceed with the procedure. Written consent was obtained. Ultrasound was performed to localize and mark an adequate  pocket of fluid in the right chest. The area was then prepped and draped in the normal sterile fashion. 1% Lidocaine was used for local anesthesia. Under ultrasound guidance a 6 Fr Safe-T-Centesis catheter was introduced. Thoracentesis was performed. The catheter was removed and a dressing applied. FINDINGS: A total of approximately 1.3 liters of yellow fluid was removed. IMPRESSION: Successful ultrasound guided therapeutic right thoracentesis yielding 1.3 liters of pleural fluid. Read by: Rowe Shey, PA-C Electronically Signed   By: Ruthann Cancer MD   On: 11/15/2020 16:39    ASSESSMENT AND PLAN: This is a very pleasant 60 years old white male with metastatic non-small cell lung cancer, adenocarcinoma now with ALK gene translocation.  The patient was initially diagnosed as a stage IIIa non-small cell lung cancer status post a course of concurrent chemoradiation with weekly carboplatin and paclitaxel followed by 1 year of consolidation treatment with immunotherapy with Imfinzi. He had evidence for disease progression recently. Repeat tissue biopsy from the left supraclavicular lymph nodes and molecular studies showed that the patient has positive ALK gene translocation.  His previous molecular studies by [employer] was negative. I had a lengthy discussion  with the patient today about his condition and treatment options. He received 1 cycle of systemic chemotherapy with carboplatin, Alimta and Keytruda.  We will discontinue his systemic chemotherapy for now because of the new findings on the molecular studies. The patient is currently on treatment with [name] 600 mg p.o. twice daily.  He is status post 8 months of treatment.   The patient has been tolerating this treatment well with no concerning adverse effects. I recommended for him to continue his current treatment with [name] with the same dose. I will see him back for follow-up visit in 6 weeks for evaluation with repeat CT scan of the neck,  chest, abdomen pelvis for restaging of his disease. For the recurrent right pleural effusion, the patient had ultrasound-guided right thoracentesis last week and he is feeling much better. For the hypothyroidism, he will continue his current treatment with levothyroxine. For the pulmonary embolism, he is currently on Eliquis 5 mg p.o. twice daily. He was advised to call immediately if he has any other concerning symptoms in the interval. The patient voices understanding of current disease status and treatment options and is in agreement with the current care plan.  All questions were answered. The patient knows to call the clinic with any problems, questions or concerns. We can certainly see the patient much sooner if necessary.  Disclaimer: This note was dictated with voice recognition software. Similar sounding words can inadvertently be transcribed and may not be corrected upon review.

## 2020-11-21 ENCOUNTER — Telehealth: Payer: Self-pay | Admitting: Internal Medicine

## 2020-11-21 LAB — TSH: TSH: 7.427 u[IU]/mL — ABNORMAL HIGH (ref 0.320–4.118)

## 2020-11-21 NOTE — Telephone Encounter (Signed)
Scheduled per los. Called and spoke with patient. Confirmed appt 

## 2020-11-29 IMAGING — US US GUIDANCE NEEDLE PLACEMENT
1 series · 9 of 9 positions shown · non-contrast
Comparison: none

CLINICAL DATA: Hypermetabolic 2.9 cm right upper lobe nodule,
consistent with
primary bronchogenic carcinoma.

[Series 1: us guidance needle placement · 9 of 9 slices shown]
[im 1/9]
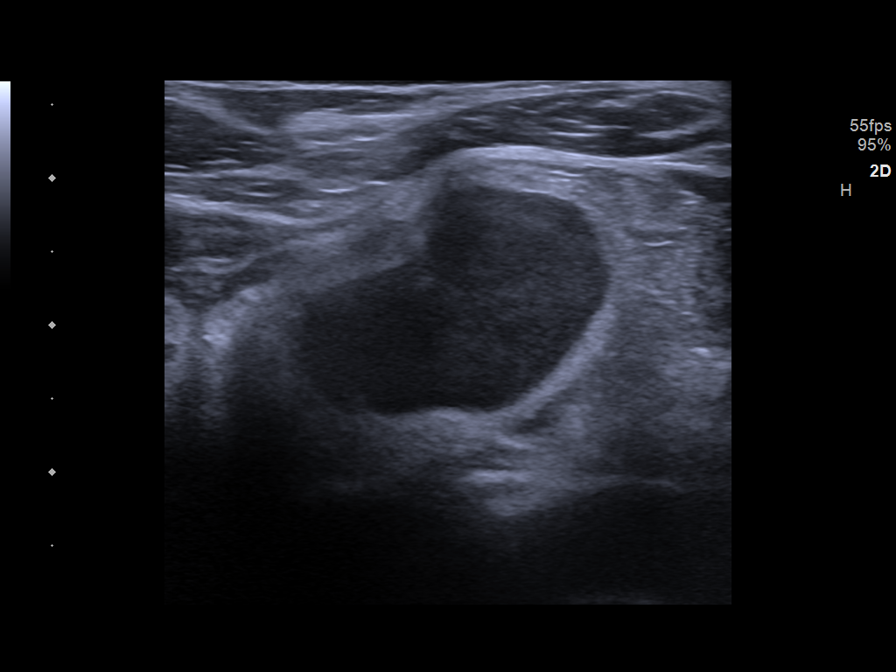
[im 2/9]
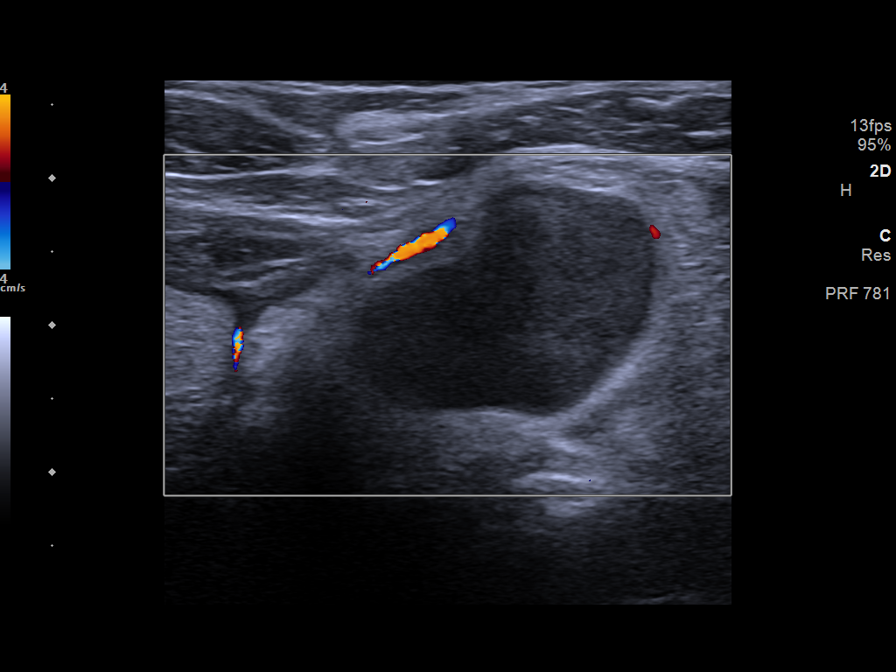
[im 3/9]
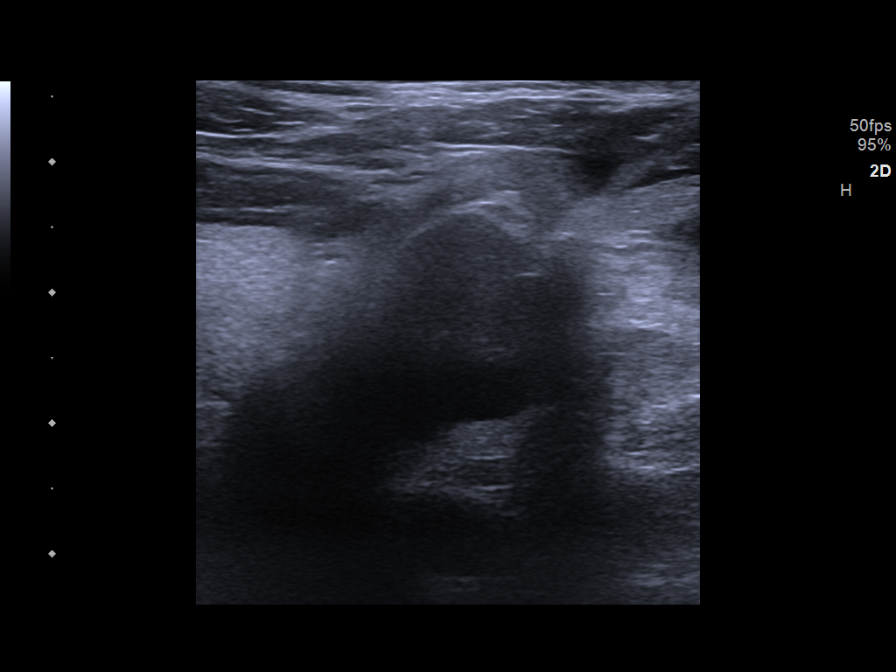
[im 4/9]
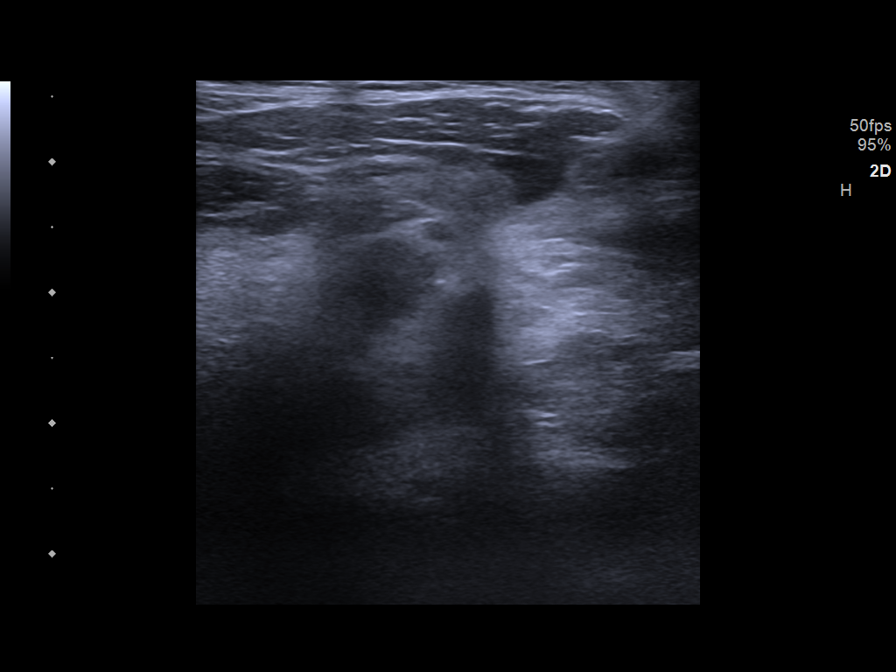
[im 5/9]
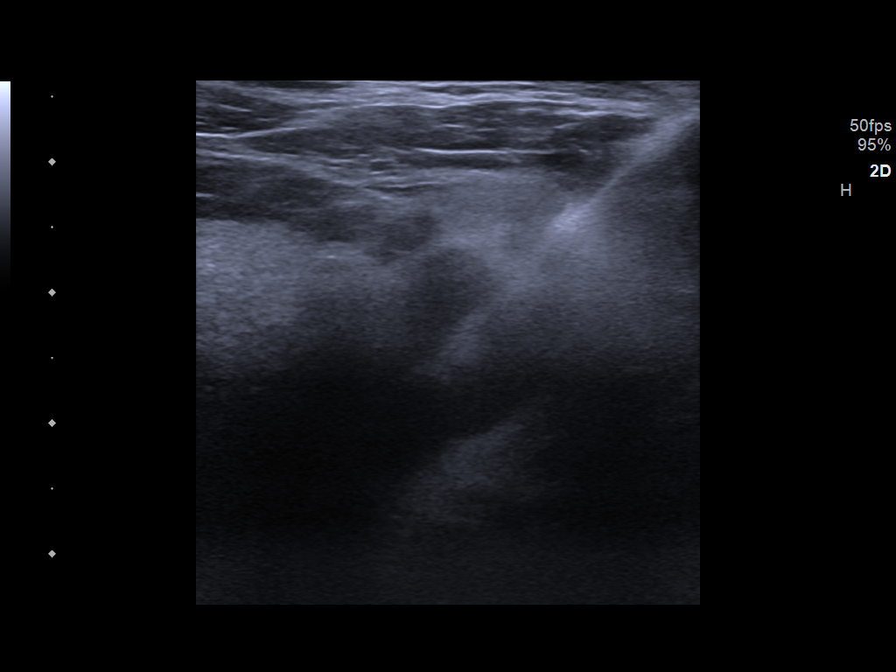
[im 6/9]
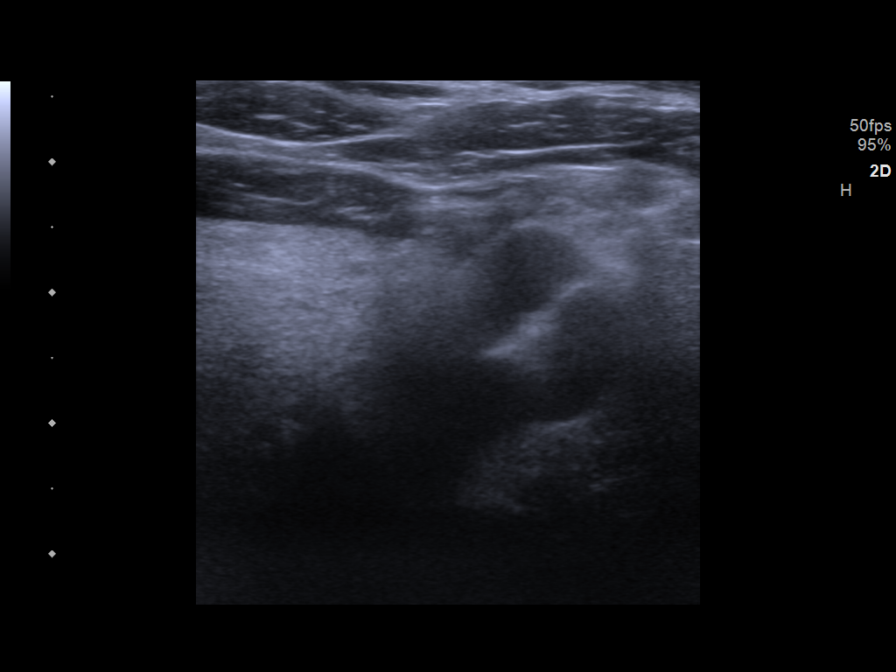
[im 7/9]
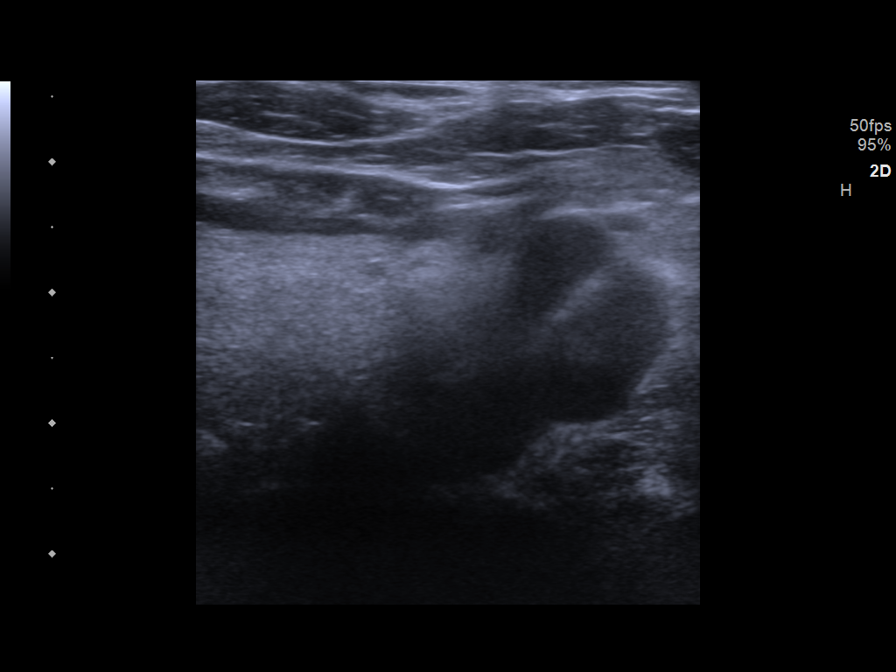
[im 8/9]
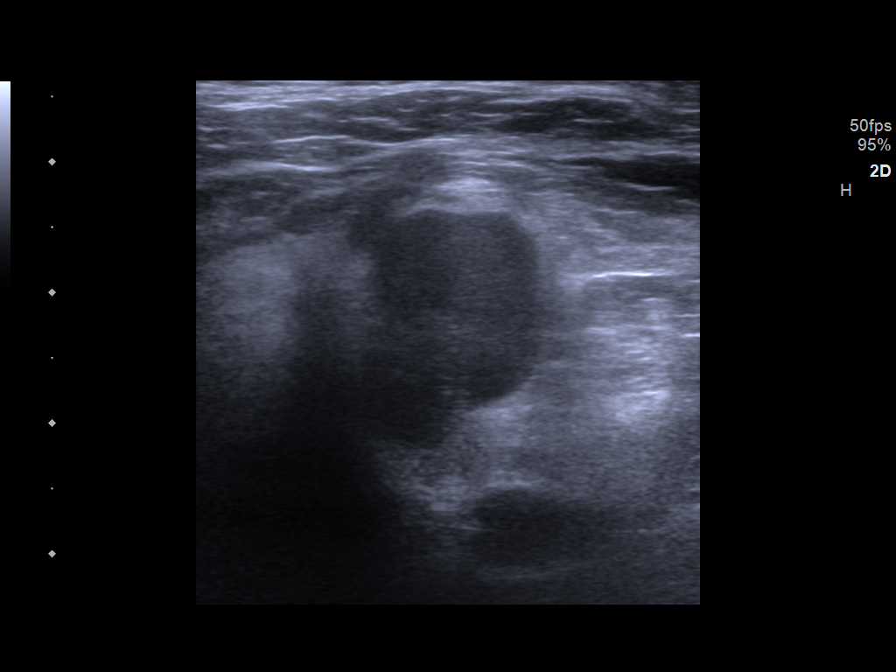
[im 9/9]
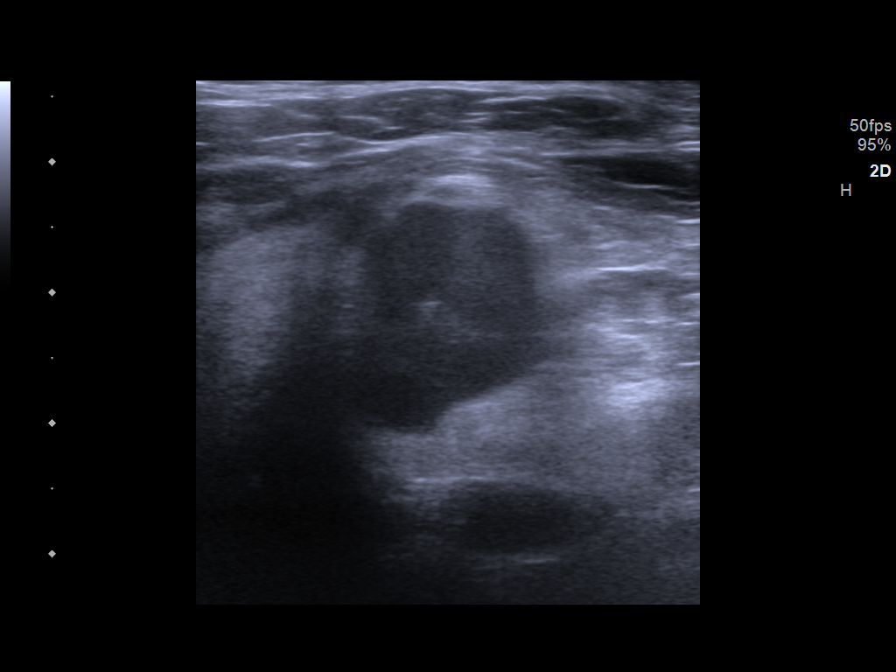

[9 of 9 positions shown; findings below may reference images not displayed]

Hypermetabolic right hilar and ipsilateral mediastinal
lymphadenopathy, consistent with metastatic disease.

Hypermetabolic bilateral supraclavicular lymphadenopathy, consistent
with metastatic disease.  Biopsy requested.

EXAM:
ULTRASOUND GUIDED CORE BIOPSY OF LEFT SUPRACLAVICULAR ADENOPATHY

MEDICATIONS:
Intravenous Fentanyl and Versed were administered as conscious
sedation during continuous monitoring of the patient's level of
consciousness and physiological / cardiorespiratory status by the
radiology RN, with a total moderate sedation time of 11 minutes.

PROCEDURE:
The procedure, risks, benefits, and alternatives were explained to
the patient. Questions regarding the procedure were encouraged and
answered. The patient understands and consents to the procedure.

Survey ultrasound of the supraclavicular regions performed. The left
adenopathy was localized and an appropriate skin entry site was
determined and marked.

The operative field was prepped with chlorhexidine in a sterile
fashion, and a sterile drape was applied covering the operative
field. A sterile gown and sterile gloves were used for the
procedure. Local anesthesia was provided with 1% Lidocaine.

Under real-time ultrasound guidance, a 17 gauge trocar needle was
advanced to the margin of the lesion. Once needle tip position was
confirmed, coaxial 18-gauge core biopsy samples were obtained,
submitted in saline to surgical pathology. The guide needle was
removed. Postprocedure scans show no hemorrhage or other apparent
complication. The patient tolerated the procedure well.

COMPLICATIONS:
None.
FINDINGS: Bilateral supraclavicular adenopathy localized. Representative core
biopsy samples obtained of the left supraclavicular adenopathy as
above.
IMPRESSION: 1. Technically successful ultrasound-guided core biopsy, left
supraclavicular adenopathy.

## 2020-12-19 ENCOUNTER — Other Ambulatory Visit: Payer: Self-pay | Admitting: Internal Medicine

## 2020-12-19 DIAGNOSIS — E039 Hypothyroidism, unspecified: Secondary | ICD-10-CM

## 2020-12-31 ENCOUNTER — Inpatient Hospital Stay: Payer: BC Managed Care – PPO | Attending: Internal Medicine

## 2020-12-31 ENCOUNTER — Ambulatory Visit (HOSPITAL_COMMUNITY)
Admission: RE | Admit: 2020-12-31 | Discharge: 2020-12-31 | Disposition: A | Discharge status: Home / Self Care | Payer: BC Managed Care – PPO | Source: Ambulatory Visit | Attending: Internal Medicine | Admitting: Internal Medicine

## 2020-12-31 ENCOUNTER — Other Ambulatory Visit: Payer: Self-pay

## 2020-12-31 ENCOUNTER — Encounter (HOSPITAL_COMMUNITY): Payer: Self-pay

## 2020-12-31 DIAGNOSIS — C770 Secondary and unspecified malignant neoplasm of lymph nodes of head, face and neck: Secondary | ICD-10-CM | POA: Insufficient documentation

## 2020-12-31 DIAGNOSIS — C3490 Malignant neoplasm of unspecified part of unspecified bronchus or lung: Secondary | ICD-10-CM | POA: Diagnosis not present

## 2020-12-31 DIAGNOSIS — C3411 Malignant neoplasm of upper lobe, right bronchus or lung: Secondary | ICD-10-CM | POA: Diagnosis not present

## 2020-12-31 DIAGNOSIS — J941 Fibrothorax: Secondary | ICD-10-CM | POA: Diagnosis not present

## 2020-12-31 DIAGNOSIS — K4090 Unilateral inguinal hernia, without obstruction or gangrene, not specified as recurrent: Secondary | ICD-10-CM | POA: Diagnosis not present

## 2020-12-31 DIAGNOSIS — R221 Localized swelling, mass and lump, neck: Secondary | ICD-10-CM | POA: Diagnosis not present

## 2020-12-31 DIAGNOSIS — J701 Chronic and other pulmonary manifestations due to radiation: Secondary | ICD-10-CM | POA: Diagnosis not present

## 2020-12-31 DIAGNOSIS — J90 Pleural effusion, not elsewhere classified: Secondary | ICD-10-CM | POA: Diagnosis not present

## 2020-12-31 DIAGNOSIS — I700 Atherosclerosis of aorta: Secondary | ICD-10-CM | POA: Diagnosis not present

## 2020-12-31 DIAGNOSIS — I2510 Atherosclerotic heart disease of native coronary artery without angina pectoris: Secondary | ICD-10-CM | POA: Diagnosis not present

## 2020-12-31 LAB — CMP (CANCER CENTER ONLY)
ALT: 41 U/L (ref 0–44)
AST: 33 U/L (ref 15–41)
Albumin: 3.9 g/dL (ref 3.5–5.0)
Alkaline Phosphatase: 150 U/L — ABNORMAL HIGH (ref 38–126)
Anion gap: 11 (ref 5–15)
BUN: 16 mg/dL (ref 6–20)
CO2: 25 mmol/L (ref 22–32)
Calcium: 9.4 mg/dL (ref 8.9–10.3)
Chloride: 104 mmol/L (ref 98–111)
Creatinine: 1.05 mg/dL (ref 0.61–1.24)
GFR, Estimated: >60 mL/min (ref >60)
Glucose, Bld: 91 mg/dL (ref 70–99)
Potassium: 4.1 mmol/L (ref 3.5–5.1)
Sodium: 140 mmol/L (ref 135–145)
Total Bilirubin: 1.2 mg/dL (ref 0.3–1.2)
Total Protein: 6.6 g/dL (ref 6.5–8.1)

## 2020-12-31 LAB — CBC WITH DIFFERENTIAL (CANCER CENTER ONLY)
Abs Immature Granulocytes: 0.11 10*3/uL — ABNORMAL HIGH (ref 0.00–0.07)
Basophils Absolute: 0.1 10*3/uL (ref 0.0–0.1)
Basophils Relative: 1 %
Eosinophils Absolute: 0.3 10*3/uL (ref 0.0–0.5)
Eosinophils Relative: 2 %
HCT: 37.6 % — ABNORMAL LOW (ref 39.0–52.0)
Hemoglobin: 13.1 g/dL (ref 13.0–17.0)
Immature Granulocytes: 1 %
Lymphocytes Relative: 13 %
Lymphs Abs: 1.4 10*3/uL (ref 0.7–4.0)
MCH: 32 pg (ref 26.0–34.0)
MCHC: 34.8 g/dL (ref 30.0–36.0)
MCV: 91.9 fL (ref 80.0–100.0)
Monocytes Absolute: 1.6 10*3/uL — ABNORMAL HIGH (ref 0.1–1.0)
Monocytes Relative: 15 %
Neutro Abs: 7.4 10*3/uL (ref 1.7–7.7)
Neutrophils Relative %: 68 %
Platelet Count: 276 10*3/uL (ref 150–400)
RBC: 4.09 MIL/uL — ABNORMAL LOW (ref 4.22–5.81)
RDW: 17 % — ABNORMAL HIGH (ref 11.5–15.5)
WBC Count: 10.9 10*3/uL — ABNORMAL HIGH (ref 4.0–10.5)
nRBC: 0 % (ref 0.0–0.2)

## 2020-12-31 MED ORDER — IOHEXOL 300 MG/ML  SOLN
100.0000 mL | Freq: Once | INTRAMUSCULAR | Status: AC | PRN
  Administered 2020-12-31: 100 mL via INTRAVENOUS

## 2020-12-31 MED ORDER — SODIUM CHLORIDE (PF) 0.9 % IJ SOLN
INTRAMUSCULAR | Status: AC
  Filled 2020-12-31: qty 50

## 2021-01-02 ENCOUNTER — Inpatient Hospital Stay: Payer: BC Managed Care – PPO | Attending: Internal Medicine | Admitting: Internal Medicine

## 2021-01-02 ENCOUNTER — Encounter: Payer: Self-pay | Admitting: Internal Medicine

## 2021-01-02 ENCOUNTER — Other Ambulatory Visit: Payer: Self-pay

## 2021-01-02 VITALS — BP 119/78 | HR 88 | Temp 97.9°F | Resp 19 | Ht 68.0 in | Wt 185.0 lb

## 2021-01-02 DIAGNOSIS — C770 Secondary and unspecified malignant neoplasm of lymph nodes of head, face and neck: Secondary | ICD-10-CM | POA: Diagnosis not present

## 2021-01-02 DIAGNOSIS — C3411 Malignant neoplasm of upper lobe, right bronchus or lung: Secondary | ICD-10-CM | POA: Diagnosis not present

## 2021-01-02 DIAGNOSIS — N400 Enlarged prostate without lower urinary tract symptoms: Secondary | ICD-10-CM | POA: Diagnosis not present

## 2021-01-02 DIAGNOSIS — E039 Hypothyroidism, unspecified: Secondary | ICD-10-CM | POA: Diagnosis not present

## 2021-01-02 DIAGNOSIS — Z86711 Personal history of pulmonary embolism: Secondary | ICD-10-CM | POA: Insufficient documentation

## 2021-01-02 DIAGNOSIS — I10 Essential (primary) hypertension: Secondary | ICD-10-CM | POA: Insufficient documentation

## 2021-01-02 DIAGNOSIS — Z7984 Long term (current) use of oral hypoglycemic drugs: Secondary | ICD-10-CM | POA: Insufficient documentation

## 2021-01-02 DIAGNOSIS — Z79899 Other long term (current) drug therapy: Secondary | ICD-10-CM | POA: Insufficient documentation

## 2021-01-02 DIAGNOSIS — Z923 Personal history of irradiation: Secondary | ICD-10-CM | POA: Insufficient documentation

## 2021-01-02 DIAGNOSIS — C3491 Malignant neoplasm of unspecified part of right bronchus or lung: Secondary | ICD-10-CM | POA: Diagnosis not present

## 2021-01-02 DIAGNOSIS — J449 Chronic obstructive pulmonary disease, unspecified: Secondary | ICD-10-CM | POA: Diagnosis not present

## 2021-01-02 DIAGNOSIS — Z7901 Long term (current) use of anticoagulants: Secondary | ICD-10-CM | POA: Insufficient documentation

## 2021-01-02 NOTE — Progress Notes (Signed)
[facility] [phone]   [phone]  OFFICE PROGRESS NOTE  Brian Pen, MD [address] [place] [address]  DIAGNOSIS:  Metastatic non-small cell lung cancer initially diagnosed as stage IIIB (T1c, N3, M0)non-small cell lung cancer, adenocarcinoma presented with right upper lobe lung mass in addition to mediastinal and bilateral supraclavicular lymphadenopathy diagnosed in January 2020.  He had evidence of disease recurrence in June 2021 with adenopathy in the subcarinal, level 3 left neck lymph node, and lymph node at the curious of the diaphragm and in the upper abdomen.  PD-L1: 10%  Guardant 360 molecular studiesshowed no actionable mutation  Foundation One Testing:  Biomarker Findings Microsatellite status - MS-Stable Tumor Mutational Burden - 4 Muts/Mb Genomic Findings For a complete list of the genes assayed, please refer to the Appendix. ALK EML4-ALK fusion (Variant 2) SMARCB1 R377C CTNNB1 S45P CDKN2A/B CDKN2B loss, CDKN2A loss CXCR4 W106* TP53 splice site 672G>A 7 Disease relevant genes with no reportable alterations: BRAF, EGFR, ERBB2, KRAS, MET, RET, ROS1  PRIOR THERAPY: 1)Concurrent chemoradiation with weekly carboplatin for AUC of 2 and paclitaxel 45 mg/M2.Status post 6 cycles. Last dose was given on October 10, 2018 with stable disease. 2) Radiation treatment to the enlarging right cervical lymph nodes under the care of Dr. Roselind Messier. First treatment 03/06/2019. Last treatment scheduledon9/05/2019 3)Consolidation treatment with immunotherapy with Imfinzi 10 mg/KG every 2 weeks. First dose November 17, 2018. Status post26cycles. 4) Systemic chemotherapy with carboplatin for an AUC of 5, Alimta 500 mg/m2, and Keytruda 200 mg IV every 3 weeks. First dose expected on 02/14/20. Status post 1 cycle.  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 5) SBRT to the left lower lobe lung nodule.  CURRENT THERAPY:  [name] ([name]) 600 mg p.o. twice daily.  He started the first dose on March 08, 2020.   Status post 10 months of treatment.  INTERVAL HISTORY: Brian [name] 60 y.o. male returns to the clinic today for follow-up visit accompanied by his wife.  The patient is feeling fine today with no concerning complaints except for mild peripheral neuropathy.  He is scheduled to see his primary care physician next months for evaluation of this condition.  He denied having any current chest pain, shortness of breath, cough or hemoptysis.  He denied having any nausea, vomiting, diarrhea or constipation.  He denied having any headache or visual changes.  He continues to tolerate his treatment with [name] fairly well.  The patient had repeat CT scan of the neck, chest, abdomen pelvis performed recently and he is here for evaluation and discussion of his scan results and treatment options.  MEDICAL HISTORY: Past Medical History:  Diagnosis Date  . COPD (chronic obstructive pulmonary disease) (HCC)   . Diabetes mellitus without complication (HCC)   . Hypertension   . nscl ca dx'd 08/2018  . Pneumonia   . Subarachnoid hemorrhage (HCC)     ALLERGIES:  has No Known Allergies.  MEDICATIONS:  Current Outpatient Medications  Medication Sig Dispense Refill  . albuterol (VENTOLIN HFA) 108 (90 Base) MCG/ACT inhaler INHALE 2 PUFFS INTO THE LUNGS EVERY 4 (FOUR) HOURS AS NEEDED FOR WHEEZING OR SHORTNESS OF BREATH. 6.7 each 4  . [name] 150 MG capsule TAKE 4 CAPSULES BY MOUTH TWICE DAILY WITH FOOD. SWALLOW CAPSULES WHOLE. DO NOT OPEN OR CRUSH. STORE IN ORIGINAL CONTAINER. 240 capsule 2  .  amLODipine (NORVASC) 5 MG tablet Take 5 mg by mouth daily.     Marland Kitchen apixaban (ELIQUIS) 5 MG TABS tablet Take 1 tablet (5 mg total) by mouth 2 (two) times daily. 60 tablet 2  . atorvastatin (LIPITOR) 20 MG tablet Take 20 mg by mouth daily.      . B Complex Vitamins (B COMPLEX PO) Take 1 tablet by mouth daily.    Marland Kitchen levothyroxine (SYNTHROID) 100 MCG tablet TAKE 1 TABLET BY MOUTH DAILY BEFORE BREAKFAST. 90 tablet 1  . magnesium hydroxide (MILK OF MAGNESIA) 400 MG/5ML suspension Take 15 mLs by mouth daily as needed for mild constipation. (Patient not taking: Reported on 10/24/2020)    . metFORMIN (GLUCOPHAGE) 500 MG tablet Take 500 mg by mouth daily with breakfast.     . Multiple Vitamin (MULTIVITAMIN) tablet Take 1 tablet by mouth daily.    Marland Kitchen Respiratory Therapy Supplies (FLUTTER) DEVI Please use up 10 times daily(4-5 breaths, 4-5 times daily) 1 each 0  . TRELEGY ELLIPTA 100-62.5-25 MCG/INH AEPB Inhale 1 puff into the lungs daily.    Marland Kitchen venlafaxine (EFFEXOR) 37.5 MG tablet Take 37.5 mg by mouth daily.      No current facility-administered medications for this visit.    SURGICAL HISTORY:  Past Surgical History:  Procedure Laterality Date  . INGUINAL HERNIA REPAIR Right 06/24/2020   Procedure: RIGHT OPEN INGUINAL HERNIA REPAIR WITH MESH;  Surgeon: Kinsinger, Arta Bruce, MD;  Location: [facility];  Service: General;  Laterality: Right;  . IR IVC FILTER PLMT / S&I Burke Keels GUID/MOD SED  03/04/2020  . IR IVC FILTER RETRIEVAL / S&I /IMG GUID/MOD SED  08/15/2020  . IR RADIOLOGIST EVAL & MGMT  08/06/2020  . LEG SURGERY  age 20    left leg, from fracture    REVIEW OF SYSTEMS:  Constitutional: negative Eyes: negative Ears, nose, mouth, throat, and face: negative Respiratory: negative Cardiovascular: negative Gastrointestinal: negative Genitourinary:negative Integument/breast: negative Hematologic/lymphatic: negative Musculoskeletal:negative Neurological: positive for paresthesia Behavioral/Psych: negative Endocrine: negative Allergic/Immunologic: negative   PHYSICAL EXAMINATION: General appearance: alert, cooperative and no distress Head: Normocephalic, without obvious abnormality, atraumatic Neck: no adenopathy, no JVD, supple, symmetrical,  trachea midline and thyroid not enlarged, symmetric, no tenderness/mass/nodules Lymph nodes: Cervical, supraclavicular, and axillary nodes normal. Resp: clear to auscultation bilaterally Back: symmetric, no curvature. ROM normal. No CVA tenderness. Cardio: regular rate and rhythm, S1, S2 normal, no murmur, click, rub or gallop GI: soft, non-tender; bowel sounds normal; no masses,  no organomegaly Extremities: extremities normal, atraumatic, no cyanosis or edema Neurologic: Alert and oriented X 3, normal strength and tone. Normal symmetric reflexes. Normal coordination and gait  ECOG PERFORMANCE STATUS: 1 - Symptomatic but completely ambulatory  Blood pressure 119/78, pulse 88, temperature 97.9 F (36.6 C), temperature source Tympanic, resp. rate 19, height $RemoveBe'5\' 8"'RrdFbYOwJ$  (1.727 m), weight 185 lb (83.9 kg), SpO2 97 %.  LABORATORY DATA: Lab Results  Component Value Date   WBC 10.9 (H) 12/31/2020   HGB 13.1 12/31/2020   HCT 37.6 (L) 12/31/2020   MCV 91.9 12/31/2020   PLT 276 12/31/2020      Chemistry      Component Value Date/Time   NA 140 12/31/2020 0942   K 4.1 12/31/2020 0942   CL 104 12/31/2020 0942   CO2 25 12/31/2020 0942   BUN 16 12/31/2020 0942   CREATININE 1.05 12/31/2020 0942      Component Value Date/Time   CALCIUM 9.4 12/31/2020 0942   ALKPHOS 150 (H) 12/31/2020 9323  AST 33 12/31/2020 0942   ALT 41 12/31/2020 0942   BILITOT 1.2 12/31/2020 0942       RADIOGRAPHIC STUDIES: CT Soft Tissue Neck W Contrast  Result Date: 12/31/2020 CLINICAL DATA:  Restaging of metastatic non-small cell lung cancer. Right anterior neck swelling. EXAM: CT NECK WITH CONTRAST TECHNIQUE: Multidetector CT imaging of the neck was performed using the standard protocol following the bolus administration of intravenous contrast. CONTRAST:  156mL OMNIPAQUE IOHEXOL 300 MG/ML  SOLN COMPARISON:  None. FINDINGS: Pharynx and larynx: No mucosal or submucosal lesion. Salivary glands: Parotid and  submandibular glands are normal. Thyroid: No thyroid abnormality by CT. See results of previous ultrasound. Lymph nodes: No enlarged or low-density nodes on either side of the neck. Vascular: No abnormal vascular finding.  No venous thrombosis. Limited intracranial: Normal Visualized orbits: Not included. Mastoids and visualized paranasal sinuses: Clear Skeleton: No evidence of osseous metastatic disease in the region. Upper chest: Large right pleural effusion layering dependently. Dependent pulmonary atelectasis. Other: None IMPRESSION: No pathologic finding in the neck. Electronically Signed   By: Nelson Chimes M.D.   On: 12/31/2020 11:48   CT Chest W Contrast  Result Date: 12/31/2020 CLINICAL DATA:  Metastatic non-small cell lung cancer restaging EXAM: CT CHEST, ABDOMEN, AND PELVIS WITH CONTRAST TECHNIQUE: Multidetector CT imaging of the chest, abdomen and pelvis was performed following the standard protocol during bolus administration of intravenous contrast. CONTRAST:  171mL OMNIPAQUE IOHEXOL 300 MG/ML  SOLN COMPARISON:  10/07/2020 FINDINGS: CT CHEST FINDINGS Cardiovascular: Scattered aortic atherosclerosis. Normal heart size. Scattered coronary artery calcifications. No pericardial effusion. Mediastinum/Nodes: Unchanged post treatment appearance of soft tissue about the right hilum. No discretely enlarged mediastinal, hilar, or axillary lymph nodes. Thyroid gland, trachea, and esophagus demonstrate no significant findings. Lungs/Pleura: Unchanged post radiation consolidation and fibrosis of the perihilar right lung. Moderate right pleural effusion, unchanged compared to prior examination. Musculoskeletal: No chest wall mass or suspicious bone lesions identified. CT ABDOMEN PELVIS FINDINGS Hepatobiliary: No solid liver abnormality is seen. No gallstones, gallbladder wall thickening, or biliary dilatation. Pancreas: Unremarkable. No pancreatic ductal dilatation or surrounding inflammatory changes. Spleen:  Normal in size without significant abnormality. Adrenals/Urinary Tract: Adrenal glands are unremarkable. Kidneys are normal, without renal calculi, solid lesion, or hydronephrosis. Bladder is unremarkable. Stomach/Bowel: Stomach is within normal limits. Appendix appears normal. No evidence of bowel wall thickening, distention, or inflammatory changes. Vascular/Lymphatic: Aortic atherosclerosis. No enlarged abdominal or pelvic lymph nodes. Reproductive: Mild prostatomegaly. Other: Status post prior right inguinal hernia repair. Small, fat containing left inguinal hernia. No abdominopelvic ascites. Musculoskeletal: No acute or significant osseous findings. IMPRESSION: 1. Unchanged post treatment appearance of the perihilar right lung. 2. No evidence of recurrent or metastatic disease in the chest, abdomen, or pelvis. 3. Moderate right pleural effusion, unchanged compared to prior examination. 4. Coronary artery disease. Aortic Atherosclerosis (ICD10-I70.0). Electronically Signed   By: Eddie Candle M.D.   On: 12/31/2020 13:02   CT Abdomen Pelvis W Contrast  Result Date: 12/31/2020 CLINICAL DATA:  Metastatic non-small cell lung cancer restaging EXAM: CT CHEST, ABDOMEN, AND PELVIS WITH CONTRAST TECHNIQUE: Multidetector CT imaging of the chest, abdomen and pelvis was performed following the standard protocol during bolus administration of intravenous contrast. CONTRAST:  160mL OMNIPAQUE IOHEXOL 300 MG/ML  SOLN COMPARISON:  10/07/2020 FINDINGS: CT CHEST FINDINGS Cardiovascular: Scattered aortic atherosclerosis. Normal heart size. Scattered coronary artery calcifications. No pericardial effusion. Mediastinum/Nodes: Unchanged post treatment appearance of soft tissue about the right hilum. No discretely enlarged mediastinal, hilar, or axillary  lymph nodes. Thyroid gland, trachea, and esophagus demonstrate no significant findings. Lungs/Pleura: Unchanged post radiation consolidation and fibrosis of the perihilar right  lung. Moderate right pleural effusion, unchanged compared to prior examination. Musculoskeletal: No chest wall mass or suspicious bone lesions identified. CT ABDOMEN PELVIS FINDINGS Hepatobiliary: No solid liver abnormality is seen. No gallstones, gallbladder wall thickening, or biliary dilatation. Pancreas: Unremarkable. No pancreatic ductal dilatation or surrounding inflammatory changes. Spleen: Normal in size without significant abnormality. Adrenals/Urinary Tract: Adrenal glands are unremarkable. Kidneys are normal, without renal calculi, solid lesion, or hydronephrosis. Bladder is unremarkable. Stomach/Bowel: Stomach is within normal limits. Appendix appears normal. No evidence of bowel wall thickening, distention, or inflammatory changes. Vascular/Lymphatic: Aortic atherosclerosis. No enlarged abdominal or pelvic lymph nodes. Reproductive: Mild prostatomegaly. Other: Status post prior right inguinal hernia repair. Small, fat containing left inguinal hernia. No abdominopelvic ascites. Musculoskeletal: No acute or significant osseous findings. IMPRESSION: 1. Unchanged post treatment appearance of the perihilar right lung. 2. No evidence of recurrent or metastatic disease in the chest, abdomen, or pelvis. 3. Moderate right pleural effusion, unchanged compared to prior examination. 4. Coronary artery disease. Aortic Atherosclerosis (ICD10-I70.0). Electronically Signed   By: Eddie Candle M.D.   On: 12/31/2020 13:02    ASSESSMENT AND PLAN: This is a very pleasant 60 years old white male with metastatic non-small cell lung cancer, adenocarcinoma now with ALK gene translocation.  The patient was initially diagnosed as a stage IIIa non-small cell lung cancer status post a course of concurrent chemoradiation with weekly carboplatin and paclitaxel followed by 1 year of consolidation treatment with immunotherapy with Imfinzi. He had evidence for disease progression recently. Repeat tissue biopsy from the left  supraclavicular lymph nodes and molecular studies showed that the patient has positive ALK gene translocation.  His previous molecular studies by [employer] was negative. I had a lengthy discussion with the patient today about his condition and treatment options. He received 1 cycle of systemic chemotherapy with carboplatin, Alimta and Keytruda.  We will discontinue his systemic chemotherapy for now because of the new findings on the molecular studies. The patient is currently on treatment with [name] 600 mg p.o. twice daily.  He is status post 10 months of treatment.   He continues to tolerate his treatment with [name] fairly well with no concerning adverse effects. The patient had repeat CT scan of the neck, chest, abdomen pelvis performed recently.  I personally and independently reviewed the scan images and discussed the results with the patient and his wife. Has a scan showed no concerning findings for disease progression and he continues to have moderate amount of right pleural effusion. I recommended for the patient to continue his current treatment with [name] with the same dose. For the moderate right pleural effusion, I gave the patient the option of referral to interventional radiology for ultrasound-guided right thoracentesis but he would like to hold on it unless he becomes symptomatic. For the hypothyroidism, he will continue his current treatment with levothyroxine and follow-up with his primary care physician. For the history of pulmonary embolism, the patient will continue his current treatment with Eliquis. He will come back for follow-up visit in 6 weeks for evaluation with repeat blood work. The patient was advised to call immediately if he has any other concerning symptoms in the interval.  The patient voices understanding of current disease status and treatment options and is in agreement with the current care plan.  All questions were answered. The patient knows to call  the  clinic with any problems, questions or concerns. We can certainly see the patient much sooner if necessary.  Disclaimer: This note was dictated with voice recognition software. Similar sounding words can inadvertently be transcribed and may not be corrected upon review.

## 2021-01-05 ENCOUNTER — Other Ambulatory Visit: Payer: Self-pay | Admitting: Internal Medicine

## 2021-01-07 ENCOUNTER — Telehealth: Payer: Self-pay | Admitting: Internal Medicine

## 2021-01-07 NOTE — Telephone Encounter (Signed)
Scheduled per los. Called and left msg. Mailed printout  °

## 2021-01-30 DIAGNOSIS — L600 Ingrowing nail: Secondary | ICD-10-CM | POA: Diagnosis not present

## 2021-01-30 DIAGNOSIS — R202 Paresthesia of skin: Secondary | ICD-10-CM | POA: Diagnosis not present

## 2021-01-30 DIAGNOSIS — R200 Anesthesia of skin: Secondary | ICD-10-CM | POA: Diagnosis not present

## 2021-02-07 ENCOUNTER — Telehealth: Payer: Self-pay | Admitting: Physician Assistant

## 2021-02-07 ENCOUNTER — Encounter: Payer: Self-pay | Admitting: Internal Medicine

## 2021-02-07 ENCOUNTER — Other Ambulatory Visit: Payer: Self-pay | Admitting: Physician Assistant

## 2021-02-07 DIAGNOSIS — C3491 Malignant neoplasm of unspecified part of right bronchus or lung: Secondary | ICD-10-CM

## 2021-02-07 NOTE — Telephone Encounter (Signed)
We received a mychart message that the patient feels like his pleural effusion needs to be drained. I put an order in for a CXR so we can evaluate the fluid. If he has a significant amount of fluid, we will arrange for an ultrasound guided thoracentesis. I called him to let him know. Advised he get the chest x ray as soon as possible so we can arrange for a thoracentesis as soon as possible if needed.

## 2021-02-10 ENCOUNTER — Ambulatory Visit (HOSPITAL_COMMUNITY)
Admission: RE | Admit: 2021-02-10 | Discharge: 2021-02-10 | Disposition: A | Discharge status: Home / Self Care | Payer: BC Managed Care – PPO | Source: Ambulatory Visit | Attending: Physician Assistant | Admitting: Physician Assistant

## 2021-02-10 ENCOUNTER — Other Ambulatory Visit: Payer: Self-pay

## 2021-02-10 ENCOUNTER — Encounter: Payer: Self-pay | Admitting: Physician Assistant

## 2021-02-10 ENCOUNTER — Other Ambulatory Visit: Payer: Self-pay | Admitting: Physician Assistant

## 2021-02-10 DIAGNOSIS — J90 Pleural effusion, not elsewhere classified: Secondary | ICD-10-CM

## 2021-02-10 DIAGNOSIS — C3491 Malignant neoplasm of unspecified part of right bronchus or lung: Secondary | ICD-10-CM | POA: Diagnosis not present

## 2021-02-12 ENCOUNTER — Other Ambulatory Visit: Payer: Self-pay

## 2021-02-12 ENCOUNTER — Inpatient Hospital Stay: Payer: BC Managed Care – PPO

## 2021-02-12 ENCOUNTER — Inpatient Hospital Stay: Payer: BC Managed Care – PPO | Attending: Internal Medicine | Admitting: Internal Medicine

## 2021-02-12 VITALS — BP 126/78 | HR 78 | Temp 97.8°F | Resp 18 | Ht 68.0 in | Wt 189.0 lb

## 2021-02-12 DIAGNOSIS — Z7901 Long term (current) use of anticoagulants: Secondary | ICD-10-CM | POA: Insufficient documentation

## 2021-02-12 DIAGNOSIS — Z79899 Other long term (current) drug therapy: Secondary | ICD-10-CM | POA: Insufficient documentation

## 2021-02-12 DIAGNOSIS — Z86711 Personal history of pulmonary embolism: Secondary | ICD-10-CM | POA: Diagnosis not present

## 2021-02-12 DIAGNOSIS — C3411 Malignant neoplasm of upper lobe, right bronchus or lung: Secondary | ICD-10-CM | POA: Diagnosis present

## 2021-02-12 DIAGNOSIS — C3490 Malignant neoplasm of unspecified part of unspecified bronchus or lung: Secondary | ICD-10-CM

## 2021-02-12 DIAGNOSIS — E039 Hypothyroidism, unspecified: Secondary | ICD-10-CM | POA: Diagnosis not present

## 2021-02-12 DIAGNOSIS — Z5111 Encounter for antineoplastic chemotherapy: Secondary | ICD-10-CM

## 2021-02-12 DIAGNOSIS — C3491 Malignant neoplasm of unspecified part of right bronchus or lung: Secondary | ICD-10-CM | POA: Diagnosis not present

## 2021-02-12 DIAGNOSIS — C770 Secondary and unspecified malignant neoplasm of lymph nodes of head, face and neck: Secondary | ICD-10-CM | POA: Diagnosis present

## 2021-02-12 DIAGNOSIS — I10 Essential (primary) hypertension: Secondary | ICD-10-CM | POA: Diagnosis not present

## 2021-02-12 DIAGNOSIS — E119 Type 2 diabetes mellitus without complications: Secondary | ICD-10-CM | POA: Insufficient documentation

## 2021-02-12 DIAGNOSIS — J449 Chronic obstructive pulmonary disease, unspecified: Secondary | ICD-10-CM | POA: Insufficient documentation

## 2021-02-12 DIAGNOSIS — Z7984 Long term (current) use of oral hypoglycemic drugs: Secondary | ICD-10-CM | POA: Diagnosis not present

## 2021-02-12 LAB — CBC WITH DIFFERENTIAL (CANCER CENTER ONLY)
Abs Immature Granulocytes: 0.09 10*3/uL — ABNORMAL HIGH (ref 0.00–0.07)
Basophils Absolute: 0.1 10*3/uL (ref 0.0–0.1)
Basophils Relative: 1 %
Eosinophils Absolute: 0.3 10*3/uL (ref 0.0–0.5)
Eosinophils Relative: 3 %
HCT: 34.1 % — ABNORMAL LOW (ref 39.0–52.0)
Hemoglobin: 11.8 g/dL — ABNORMAL LOW (ref 13.0–17.0)
Immature Granulocytes: 1 %
Lymphocytes Relative: 13 %
Lymphs Abs: 1.3 10*3/uL (ref 0.7–4.0)
MCH: 32.2 pg (ref 26.0–34.0)
MCHC: 34.6 g/dL (ref 30.0–36.0)
MCV: 93.2 fL (ref 80.0–100.0)
Monocytes Absolute: 1.3 10*3/uL — ABNORMAL HIGH (ref 0.1–1.0)
Monocytes Relative: 12 %
Neutro Abs: 7.4 10*3/uL (ref 1.7–7.7)
Neutrophils Relative %: 70 %
Platelet Count: 279 10*3/uL (ref 150–400)
RBC: 3.66 MIL/uL — ABNORMAL LOW (ref 4.22–5.81)
RDW: 17.3 % — ABNORMAL HIGH (ref 11.5–15.5)
WBC Count: 10.5 10*3/uL (ref 4.0–10.5)
nRBC: 0 % (ref 0.0–0.2)

## 2021-02-12 LAB — CMP (CANCER CENTER ONLY)
ALT: 35 U/L (ref 0–44)
AST: 28 U/L (ref 15–41)
Albumin: 3.5 g/dL (ref 3.5–5.0)
Alkaline Phosphatase: 136 U/L — ABNORMAL HIGH (ref 38–126)
Anion gap: 8 (ref 5–15)
BUN: 16 mg/dL (ref 6–20)
CO2: 27 mmol/L (ref 22–32)
Calcium: 8.6 mg/dL — ABNORMAL LOW (ref 8.9–10.3)
Chloride: 105 mmol/L (ref 98–111)
Creatinine: 1.06 mg/dL (ref 0.61–1.24)
GFR, Estimated: >60 mL/min (ref >60)
Glucose, Bld: 102 mg/dL — ABNORMAL HIGH (ref 70–99)
Potassium: 3.9 mmol/L (ref 3.5–5.1)
Sodium: 140 mmol/L (ref 135–145)
Total Bilirubin: 0.7 mg/dL (ref 0.3–1.2)
Total Protein: 6.2 g/dL — ABNORMAL LOW (ref 6.5–8.1)

## 2021-02-12 NOTE — Progress Notes (Signed)
[place] [phone]   [phone]  OFFICE PROGRESS NOTE  [name], [place] [id number]  DIAGNOSIS:  Metastatic non-small cell lung cancer initially diagnosed as stage IIIB (T1c, N3, M0) non-small cell lung cancer, adenocarcinoma presented with right upper lobe lung mass in addition to mediastinal and bilateral supraclavicular lymphadenopathy diagnosed in January 2020.  He had evidence of disease recurrence in June 2021 with adenopathy in the subcarinal, level 3 left neck lymph node, and lymph node at the curious of the diaphragm and in the upper abdomen.   PD-L1: 10%   [employer] 360 molecular studies showed no actionable mutation   [facility] Testing:   Biomarker Findings Microsatellite status - MS-Stable Tumor Mutational Burden - 4 Muts/Mb Genomic Findings For a complete list of the genes assayed, please refer to the Appendix. ALK EML4-ALK fusion (Variant 2) SMARCB1 R377C CTNNB1 S45P CDKN2A/B CDKN2B loss, CDKN2A loss CXCR4 D532* DJ24 splice site 268T>M 7 Disease relevant genes with no reportable alterations: BRAF, EGFR, ERBB2, KRAS, MET, RET, ROS1  PRIOR THERAPY: 1) Concurrent chemoradiation with weekly carboplatin for AUC of 2 and paclitaxel 45 mg/M2.  Status post 6 cycles.  Last dose was given on October 10, 2018 with stable disease. 2) Radiation treatment to the enlarging right cervical lymph nodes under the care of Dr. Sondra Come. First treatment 03/06/2019. Last treatment scheduled on 04/13/2019 3) Consolidation treatment with immunotherapy with Imfinzi 10 mg/KG every 2 weeks.  First dose November 17, 2018.  Status post 26 cycles. 4) Systemic chemotherapy with carboplatin for an AUC of 5, Alimta 500 mg/m2, and Keytruda 200 mg IV every 3 weeks. First dose expected on 02/14/20. Status post 1 cycle.  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 5) SBRT to the left lower lobe lung nodule.   CURRENT THERAPY:  [name] ([name]) 600 mg p.o. twice daily.  He started the first dose on March 08, 2020.   Status post 11 months of treatment.  INTERVAL HISTORY: Brian [name] 60 y.o. male returns to the clinic today for follow-up visit.  The patient is feeling fine today with no concerning complaints except for occasional shortness of breath with exertion especially with the hot weather and also when he lays on the right side during sleep.  He denied having any current chest pain, cough or hemoptysis.  He denied having any nausea, vomiting, diarrhea or constipation.  He has no headache or visual changes.  He continues to tolerate his treatment with [name] fairly well.  The patient is here today for evaluation and repeat blood work.  MEDICAL HISTORY: Past Medical History:  Diagnosis Date   COPD (chronic obstructive pulmonary disease) ([facility])    Diabetes mellitus without complication ([place])    Hypertension    nscl ca dx'd 08/2018   Pneumonia    Subarachnoid hemorrhage ([facility])     ALLERGIES:  has No Known Allergies.  MEDICATIONS:  Current Outpatient Medications  Medication Sig Dispense Refill   albuterol (VENTOLIN HFA) 108 (90 Base) MCG/ACT inhaler INHALE 2 PUFFS INTO THE LUNGS EVERY 4 (FOUR) HOURS AS NEEDED FOR WHEEZING OR SHORTNESS OF BREATH. 6.7 each 4   [name] 150 MG capsule TAKE 4 CAPSULES BY MOUTH TWICE DAILY WITH FOOD. SWALLOW CAPSULES WHOLE. DO NOT OPEN OR CRUSH. STORE IN ORIGINAL CONTAINER. 240 capsule 2   amLODipine (NORVASC) 5 MG  tablet Take 5 mg by mouth daily.      atorvastatin (LIPITOR) 20 MG tablet Take 20 mg by mouth daily.      B Complex Vitamins (B COMPLEX PO) Take 1 tablet by mouth daily.     ELIQUIS 5 MG TABS tablet TAKE 1 TABLET BY MOUTH TWICE A DAY 60 tablet 2   levothyroxine (SYNTHROID) 100 MCG tablet TAKE 1 TABLET BY MOUTH DAILY BEFORE BREAKFAST. 90 tablet 1   metFORMIN (GLUCOPHAGE) 500  MG tablet Take 500 mg by mouth daily with breakfast.      Multiple Vitamin (MULTIVITAMIN) tablet Take 1 tablet by mouth daily.     Respiratory Therapy Supplies (FLUTTER) DEVI Please use up 10 times daily(4-5 breaths, 4-5 times daily) 1 each 0   TRELEGY ELLIPTA 100-62.5-25 MCG/INH AEPB Inhale 1 puff into the lungs daily.     venlafaxine (EFFEXOR) 37.5 MG tablet Take 37.5 mg by mouth daily.      No current facility-administered medications for this visit.    SURGICAL HISTORY:  Past Surgical History:  Procedure Laterality Date   INGUINAL HERNIA REPAIR Right 06/24/2020   Procedure: RIGHT OPEN INGUINAL HERNIA REPAIR WITH MESH;  Surgeon: Kinsinger, Arta Bruce, MD;  Location: [facility];  Service: General;  Laterality: Right;   IR IVC FILTER PLMT / S&I Burke Keels GUID/MOD SED  03/04/2020   IR IVC FILTER RETRIEVAL / S&I Burke Keels GUID/MOD SED  08/15/2020   IR RADIOLOGIST EVAL & MGMT  08/06/2020   LEG SURGERY  age 44    left leg, from fracture    REVIEW OF SYSTEMS:  A comprehensive review of systems was negative except for: Constitutional: positive for fatigue Respiratory: positive for dyspnea on exertion   PHYSICAL EXAMINATION: General appearance: alert, cooperative, fatigued, and no distress Head: Normocephalic, without obvious abnormality, atraumatic Neck: no adenopathy, no JVD, supple, symmetrical, trachea midline, and thyroid not enlarged, symmetric, no tenderness/mass/nodules Lymph nodes: Cervical, supraclavicular, and axillary nodes normal. Resp: diminished breath sounds RLL and dullness to percussion RLL Back: symmetric, no curvature. ROM normal. No CVA tenderness. Cardio: regular rate and rhythm, S1, S2 normal, no murmur, click, rub or gallop GI: soft, non-tender; bowel sounds normal; no masses,  no organomegaly Extremities: extremities normal, atraumatic, no cyanosis or edema  ECOG PERFORMANCE STATUS: 1 - Symptomatic but completely ambulatory  Blood pressure 126/78, pulse 78, temperature 97.8 F  (36.6 C), temperature source Tympanic, resp. rate 18, height '5\' 8"'  (1.727 m), weight 189 lb (85.7 kg), SpO2 96 %.  LABORATORY DATA: Lab Results  Component Value Date   WBC 10.5 02/12/2021   HGB 11.8 (L) 02/12/2021   HCT 34.1 (L) 02/12/2021   MCV 93.2 02/12/2021   PLT 279 02/12/2021      Chemistry      Component Value Date/Time   NA 140 12/31/2020 0942   K 4.1 12/31/2020 0942   CL 104 12/31/2020 0942   CO2 25 12/31/2020 0942   BUN 16 12/31/2020 0942   CREATININE 1.05 12/31/2020 0942      Component Value Date/Time   CALCIUM 9.4 12/31/2020 0942   ALKPHOS 150 (H) 12/31/2020 0942   AST 33 12/31/2020 0942   ALT 41 12/31/2020 0942   BILITOT 1.2 12/31/2020 0942       RADIOGRAPHIC STUDIES: DG Chest 2 View  Result Date: 02/10/2021 CLINICAL DATA:  Worsening dyspnea.  History of lung cancer EXAM: CHEST - 2 VIEW COMPARISON:  11/15/2020, 12/31/2020 FINDINGS: Heart size is normal. Chronic post treatment changes within the right  hilar/perihilar region, not appreciably changed. No new airspace consolidation. Small-moderate right-sided pleural effusion. Left lung is clear. No pneumothorax. IMPRESSION: 1. Small-moderate right-sided pleural effusion. 2. Chronic post treatment changes within the right hilar/perihilar region, not appreciably changed. Electronically Signed   By: Davina Poke D.O.   On: 02/10/2021 15:19     ASSESSMENT AND PLAN: This is a very pleasant 60 years old white male with metastatic non-small cell lung cancer, adenocarcinoma now with ALK gene translocation.  The patient was initially diagnosed as a stage IIIa non-small cell lung cancer status post a course of concurrent chemoradiation with weekly carboplatin and paclitaxel followed by 1 year of consolidation treatment with immunotherapy with Imfinzi. He had evidence for disease progression recently. Repeat tissue biopsy from the left supraclavicular lymph nodes and molecular studies showed that the patient has positive  ALK gene translocation.  His previous molecular studies by [employer] 360 was negative. I had a lengthy discussion with the patient today about his condition and treatment options. He received 1 cycle of systemic chemotherapy with carboplatin, Alimta and Keytruda.  We will discontinue his systemic chemotherapy for now because of the new findings on the molecular studies. The patient is currently on treatment with [name] 600 mg p.o. twice daily.  He is status post 11 months of treatment.   I recommended for him to continue his current treatment with [name] with the same dose. For the recurrent right pleural effusion, the patient had a recent chest x-ray that showed small to moderate right-sided pleural effusion.  We arranged for the patient to have ultrasound-guided thoracentesis early next week. For the hypothyroidism, he will continue his current treatment with levothyroxine and follow-up with his primary care physician. For the history of pulmonary embolism, the patient will continue his current treatment with Eliquis. I will see him back for follow-up visit in 6 weeks for evaluation with repeat CT scan of the chest, abdomen pelvis for restaging of his disease. He was advised to call immediately if he has any other concerning symptoms in the interval.  The patient voices understanding of current disease status and treatment options and is in agreement with the current care plan.  All questions were answered. The patient knows to call the clinic with any problems, questions or concerns. We can certainly see the patient much sooner if necessary.  Disclaimer: This note was dictated with voice recognition software. Similar sounding words can inadvertently be transcribed and may not be corrected upon review.

## 2021-02-17 ENCOUNTER — Ambulatory Visit (HOSPITAL_COMMUNITY)
Admission: RE | Admit: 2021-02-17 | Discharge: 2021-02-17 | Disposition: A | Discharge status: Home / Self Care | Payer: BC Managed Care – PPO | Source: Ambulatory Visit | Attending: Radiology | Admitting: Radiology

## 2021-02-17 ENCOUNTER — Other Ambulatory Visit: Payer: Self-pay

## 2021-02-17 ENCOUNTER — Ambulatory Visit (HOSPITAL_COMMUNITY)
Admission: RE | Admit: 2021-02-17 | Discharge: 2021-02-17 | Disposition: A | Discharge status: Home / Self Care | Payer: BC Managed Care – PPO | Source: Ambulatory Visit | Attending: Physician Assistant | Admitting: Physician Assistant

## 2021-02-17 DIAGNOSIS — J90 Pleural effusion, not elsewhere classified: Secondary | ICD-10-CM | POA: Diagnosis not present

## 2021-02-17 MED ORDER — LIDOCAINE HCL 1 % IJ SOLN
INTRAMUSCULAR | Status: AC
  Filled 2021-02-17: qty 20

## 2021-02-17 NOTE — Procedures (Signed)
PROCEDURE SUMMARY:  Successful US guided right thoracentesis. Yielded 1.2 L of clear amber fluid. Pt tolerated procedure well. No immediate complications.  Specimen was not sent for labs. CXR ordered.  EBL < 5 mL  Ascencion Dike PA-C 02/17/2021 10:28 AM

## 2021-02-27 ENCOUNTER — Encounter: Payer: Self-pay | Admitting: Internal Medicine

## 2021-02-27 ENCOUNTER — Other Ambulatory Visit: Payer: Self-pay | Admitting: Physician Assistant

## 2021-02-27 DIAGNOSIS — C3491 Malignant neoplasm of unspecified part of right bronchus or lung: Secondary | ICD-10-CM

## 2021-02-28 ENCOUNTER — Encounter: Payer: Self-pay | Admitting: Internal Medicine

## 2021-03-10 ENCOUNTER — Other Ambulatory Visit: Payer: Self-pay | Admitting: Internal Medicine

## 2021-03-10 DIAGNOSIS — C3491 Malignant neoplasm of unspecified part of right bronchus or lung: Secondary | ICD-10-CM

## 2021-03-19 ENCOUNTER — Encounter (HOSPITAL_COMMUNITY): Payer: Self-pay | Admitting: Emergency Medicine

## 2021-03-19 ENCOUNTER — Emergency Department (HOSPITAL_COMMUNITY)
Admission: EM | Admit: 2021-03-19 | Discharge: 2021-03-19 | Disposition: A | Discharge status: Home / Self Care | Payer: BC Managed Care – PPO | Attending: Emergency Medicine | Admitting: Emergency Medicine

## 2021-03-19 ENCOUNTER — Other Ambulatory Visit: Payer: Self-pay

## 2021-03-19 DIAGNOSIS — Z85118 Personal history of other malignant neoplasm of bronchus and lung: Secondary | ICD-10-CM | POA: Insufficient documentation

## 2021-03-19 DIAGNOSIS — R110 Nausea: Secondary | ICD-10-CM | POA: Diagnosis not present

## 2021-03-19 DIAGNOSIS — R109 Unspecified abdominal pain: Secondary | ICD-10-CM | POA: Insufficient documentation

## 2021-03-19 DIAGNOSIS — Z5321 Procedure and treatment not carried out due to patient leaving prior to being seen by health care provider: Secondary | ICD-10-CM | POA: Insufficient documentation

## 2021-03-19 NOTE — ED Triage Notes (Signed)
Patient c/o abdominal pain with nausea this morning. Describes pain as cramping. Denies V/D. Hx lung cancer. Daily oral chemo.

## 2021-03-19 NOTE — ED Notes (Signed)
When going to move patient to treatment room, patient reports to this RN that his symptoms have resolved and he desires to go home instead of being seen.

## 2021-03-28 ENCOUNTER — Ambulatory Visit (HOSPITAL_COMMUNITY)
Admission: RE | Admit: 2021-03-28 | Discharge: 2021-03-28 | Disposition: A | Discharge status: Home / Self Care | Payer: BC Managed Care – PPO | Source: Ambulatory Visit | Attending: Internal Medicine | Admitting: Internal Medicine

## 2021-03-28 ENCOUNTER — Other Ambulatory Visit: Payer: Self-pay

## 2021-03-28 ENCOUNTER — Inpatient Hospital Stay: Payer: BC Managed Care – PPO | Attending: Internal Medicine

## 2021-03-28 DIAGNOSIS — E039 Hypothyroidism, unspecified: Secondary | ICD-10-CM | POA: Diagnosis not present

## 2021-03-28 DIAGNOSIS — Z86711 Personal history of pulmonary embolism: Secondary | ICD-10-CM | POA: Diagnosis not present

## 2021-03-28 DIAGNOSIS — I2510 Atherosclerotic heart disease of native coronary artery without angina pectoris: Secondary | ICD-10-CM | POA: Diagnosis not present

## 2021-03-28 DIAGNOSIS — J449 Chronic obstructive pulmonary disease, unspecified: Secondary | ICD-10-CM | POA: Diagnosis not present

## 2021-03-28 DIAGNOSIS — C3490 Malignant neoplasm of unspecified part of unspecified bronchus or lung: Secondary | ICD-10-CM | POA: Diagnosis present

## 2021-03-28 DIAGNOSIS — Z7901 Long term (current) use of anticoagulants: Secondary | ICD-10-CM | POA: Diagnosis not present

## 2021-03-28 DIAGNOSIS — C770 Secondary and unspecified malignant neoplasm of lymph nodes of head, face and neck: Secondary | ICD-10-CM | POA: Diagnosis present

## 2021-03-28 DIAGNOSIS — I10 Essential (primary) hypertension: Secondary | ICD-10-CM | POA: Insufficient documentation

## 2021-03-28 DIAGNOSIS — C3411 Malignant neoplasm of upper lobe, right bronchus or lung: Secondary | ICD-10-CM | POA: Insufficient documentation

## 2021-03-28 DIAGNOSIS — Z79899 Other long term (current) drug therapy: Secondary | ICD-10-CM | POA: Insufficient documentation

## 2021-03-28 DIAGNOSIS — E119 Type 2 diabetes mellitus without complications: Secondary | ICD-10-CM | POA: Diagnosis not present

## 2021-03-28 DIAGNOSIS — Z7984 Long term (current) use of oral hypoglycemic drugs: Secondary | ICD-10-CM | POA: Diagnosis not present

## 2021-03-28 LAB — CMP (CANCER CENTER ONLY)
ALT: 34 U/L (ref 0–44)
AST: 30 U/L (ref 15–41)
Albumin: 4.1 g/dL (ref 3.5–5.0)
Alkaline Phosphatase: 170 U/L — ABNORMAL HIGH (ref 38–126)
Anion gap: 10 (ref 5–15)
BUN: 13 mg/dL (ref 6–20)
CO2: 26 mmol/L (ref 22–32)
Calcium: 9.4 mg/dL (ref 8.9–10.3)
Chloride: 103 mmol/L (ref 98–111)
Creatinine: 1.1 mg/dL (ref 0.61–1.24)
GFR, Estimated: >60 mL/min (ref >60)
Glucose, Bld: 93 mg/dL (ref 70–99)
Potassium: 3.9 mmol/L (ref 3.5–5.1)
Sodium: 139 mmol/L (ref 135–145)
Total Bilirubin: 1 mg/dL (ref 0.3–1.2)
Total Protein: 7 g/dL (ref 6.5–8.1)

## 2021-03-28 LAB — CBC WITH DIFFERENTIAL (CANCER CENTER ONLY)
Abs Immature Granulocytes: 0.1 10*3/uL — ABNORMAL HIGH (ref 0.00–0.07)
Basophils Absolute: 0.1 10*3/uL (ref 0.0–0.1)
Basophils Relative: 1 %
Eosinophils Absolute: 0.3 10*3/uL (ref 0.0–0.5)
Eosinophils Relative: 3 %
HCT: 37.1 % — ABNORMAL LOW (ref 39.0–52.0)
Hemoglobin: 12.9 g/dL — ABNORMAL LOW (ref 13.0–17.0)
Immature Granulocytes: 1 %
Lymphocytes Relative: 14 %
Lymphs Abs: 1.6 10*3/uL (ref 0.7–4.0)
MCH: 32.2 pg (ref 26.0–34.0)
MCHC: 34.8 g/dL (ref 30.0–36.0)
MCV: 92.5 fL (ref 80.0–100.0)
Monocytes Absolute: 1.5 10*3/uL — ABNORMAL HIGH (ref 0.1–1.0)
Monocytes Relative: 13 %
Neutro Abs: 7.9 10*3/uL — ABNORMAL HIGH (ref 1.7–7.7)
Neutrophils Relative %: 68 %
Platelet Count: 306 10*3/uL (ref 150–400)
RBC: 4.01 MIL/uL — ABNORMAL LOW (ref 4.22–5.81)
RDW: 16.1 % — ABNORMAL HIGH (ref 11.5–15.5)
WBC Count: 11.6 10*3/uL — ABNORMAL HIGH (ref 4.0–10.5)
nRBC: 0 % (ref 0.0–0.2)

## 2021-03-28 MED ORDER — IOHEXOL 350 MG/ML SOLN
80.0000 mL | Freq: Once | INTRAVENOUS | Status: AC | PRN
  Administered 2021-03-28: 80 mL via INTRAVENOUS

## 2021-03-31 ENCOUNTER — Inpatient Hospital Stay (HOSPITAL_BASED_OUTPATIENT_CLINIC_OR_DEPARTMENT_OTHER): Payer: BC Managed Care – PPO | Admitting: Internal Medicine

## 2021-03-31 ENCOUNTER — Other Ambulatory Visit: Payer: Self-pay

## 2021-03-31 VITALS — BP 117/78 | HR 86 | Temp 97.4°F | Resp 18 | Ht 68.0 in | Wt 185.1 lb

## 2021-03-31 DIAGNOSIS — C3411 Malignant neoplasm of upper lobe, right bronchus or lung: Secondary | ICD-10-CM | POA: Diagnosis not present

## 2021-03-31 DIAGNOSIS — C3491 Malignant neoplasm of unspecified part of right bronchus or lung: Secondary | ICD-10-CM | POA: Diagnosis not present

## 2021-03-31 NOTE — Progress Notes (Signed)
[facility] [phone]   [phone]  OFFICE PROGRESS NOTE  [name], MD [place] [address]  DIAGNOSIS:  Metastatic non-small cell lung cancer initially diagnosed as stage IIIB (T1c, N3, M0) non-small cell lung cancer, adenocarcinoma presented with right upper lobe lung mass in addition to mediastinal and bilateral supraclavicular lymphadenopathy diagnosed in January 2020.  He had evidence of disease recurrence in June 2021 with adenopathy in the subcarinal, level 3 left neck lymph node, and lymph node at the curious of the diaphragm and in the upper abdomen.   PD-L1: 10%   Guardant 360 molecular studies showed no actionable mutation   [facility] Testing:   Biomarker Findings Microsatellite status - MS-Stable Tumor Mutational Burden - 4 Muts/Mb Genomic Findings For a complete list of the genes assayed, please refer to the Appendix. ALK EML4-ALK fusion (Variant 2) SMARCB1 R377C CTNNB1 S45P CDKN2A/B CDKN2B loss, CDKN2A loss CXCR4 Y101* BP10 splice site 258N>I 7 Disease relevant genes with no reportable alterations: BRAF, EGFR, ERBB2, KRAS, MET, RET, ROS1  PRIOR THERAPY: 1) Concurrent chemoradiation with weekly carboplatin for AUC of 2 and paclitaxel 45 mg/M2.  Status post 6 cycles.  Last dose was given on October 10, 2018 with stable disease. 2) Radiation treatment to the enlarging right cervical lymph nodes under the care of Dr. Sondra Come. First treatment 03/06/2019. Last treatment scheduled on 04/13/2019 3) Consolidation treatment with immunotherapy with Imfinzi 10 mg/KG every 2 weeks.  First dose November 17, 2018.  Status post 26 cycles. 4) Systemic chemotherapy with carboplatin for an AUC of 5, Alimta 500 mg/m2, and Keytruda 200 mg IV every 3 weeks. First dose expected on 02/14/20. Status post 1 cycle.  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 5) SBRT to the left lower lobe lung nodule.   CURRENT THERAPY:  [name] ([name]) 600 mg p.o. twice daily.  He started the first dose on March 08, 2020.   Status post 13 months of treatment.  INTERVAL HISTORY: Brian [name] 60 y.o. male returns to the clinic today for follow-up visit.  The patient is feeling fine today with no concerning complaints.  He denied having any current chest pain but has occasional shortness of breath with exertion with no cough or hemoptysis.  He denied having any fever or chills.  He has no nausea, vomiting, diarrhea or constipation.  He denied having any headache or visual changes.  He has no weight loss or night sweats.  He continues to tolerate his treatment with [name] fairly well.  The patient had repeat CT scan of the chest, abdomen pelvis performed few days ago and he is here for evaluation and discussion of his discuss results.  MEDICAL HISTORY: Past Medical History:  Diagnosis Date   COPD (chronic obstructive pulmonary disease) ([place])    Diabetes mellitus without complication ([place])    Hypertension    nscl ca dx'd 08/2018   Pneumonia    Subarachnoid hemorrhage ([facility])     ALLERGIES:  has No Known Allergies.  MEDICATIONS:  Current Outpatient Medications  Medication Sig Dispense Refill   albuterol (VENTOLIN HFA) 108 (90 Base) MCG/ACT inhaler INHALE 2 PUFFS INTO THE LUNGS EVERY 4 (FOUR) HOURS AS NEEDED FOR WHEEZING OR SHORTNESS OF BREATH. 6.7 each 4   [name] 150 MG capsule TAKE 4 CAPSULES BY MOUTH TWICE DAILY WITH FOOD. SWALLOW CAPSULES  WHOLE. DO NOT OPEN OR CRUSH. STORE IN ORIGINAL CONTAINER. 240 capsule 2   amLODipine (NORVASC) 5 MG tablet Take 5 mg by mouth daily.      atorvastatin (LIPITOR) 20 MG tablet Take 20 mg by mouth daily.      B Complex Vitamins (B COMPLEX PO) Take 1 tablet by mouth daily.     ELIQUIS 5 MG TABS tablet TAKE 1 TABLET BY MOUTH TWICE A DAY 60 tablet 2   levothyroxine (SYNTHROID) 100 MCG tablet TAKE  1 TABLET BY MOUTH DAILY BEFORE BREAKFAST. 90 tablet 1   metFORMIN (GLUCOPHAGE) 500 MG tablet Take 500 mg by mouth daily with breakfast.      Multiple Vitamin (MULTIVITAMIN) tablet Take 1 tablet by mouth daily.     Respiratory Therapy Supplies (FLUTTER) DEVI Please use up 10 times daily(4-5 breaths, 4-5 times daily) 1 each 0   TRELEGY ELLIPTA 100-62.5-25 MCG/INH AEPB Inhale 1 puff into the lungs daily.     venlafaxine (EFFEXOR) 37.5 MG tablet Take 37.5 mg by mouth daily.      No current facility-administered medications for this visit.    SURGICAL HISTORY:  Past Surgical History:  Procedure Laterality Date   INGUINAL HERNIA REPAIR Right 06/24/2020   Procedure: RIGHT OPEN INGUINAL HERNIA REPAIR WITH MESH;  Surgeon: Kinsinger, Arta Bruce, MD;  Location: [facility];  Service: General;  Laterality: Right;   IR IVC FILTER PLMT / S&I Burke Keels GUID/MOD SED  03/04/2020   IR IVC FILTER RETRIEVAL / S&I Burke Keels GUID/MOD SED  08/15/2020   IR RADIOLOGIST EVAL & MGMT  08/06/2020   LEG SURGERY  age 71    left leg, from fracture    REVIEW OF SYSTEMS:  Constitutional: negative Eyes: negative Ears, nose, mouth, throat, and face: negative Respiratory: positive for dyspnea on exertion Cardiovascular: negative Gastrointestinal: negative Genitourinary:negative Integument/breast: negative Hematologic/lymphatic: negative Musculoskeletal:negative Neurological: negative Behavioral/Psych: negative Endocrine: negative Allergic/Immunologic: negative   PHYSICAL EXAMINATION: General appearance: alert, cooperative, fatigued, and no distress Head: Normocephalic, without obvious abnormality, atraumatic Neck: no adenopathy, no JVD, supple, symmetrical, trachea midline, and thyroid not enlarged, symmetric, no tenderness/mass/nodules Lymph nodes: Cervical, supraclavicular, and axillary nodes normal. Resp: diminished breath sounds RLL and dullness to percussion RLL Back: symmetric, no curvature. ROM normal. No CVA  tenderness. Cardio: regular rate and rhythm, S1, S2 normal, no murmur, click, rub or gallop GI: soft, non-tender; bowel sounds normal; no masses,  no organomegaly Extremities: extremities normal, atraumatic, no cyanosis or edema Neurologic: Alert and oriented X 3, normal strength and tone. Normal symmetric reflexes. Normal coordination and gait  ECOG PERFORMANCE STATUS: 1 - Symptomatic but completely ambulatory  Blood pressure 117/78, pulse 86, temperature (!) 97.4 F (36.3 C), temperature source Tympanic, resp. rate 18, height $RemoveBe'5\' 8"'BKfgcTPPv$  (1.727 m), weight 185 lb 1.6 oz (84 kg), SpO2 98 %.  LABORATORY DATA: Lab Results  Component Value Date   WBC 11.6 (H) 03/28/2021   HGB 12.9 (L) 03/28/2021   HCT 37.1 (L) 03/28/2021   MCV 92.5 03/28/2021   PLT 306 03/28/2021      Chemistry      Component Value Date/Time   NA 139 03/28/2021 1401   K 3.9 03/28/2021 1401   CL 103 03/28/2021 1401   CO2 26 03/28/2021 1401   BUN 13 03/28/2021 1401   CREATININE 1.10 03/28/2021 1401      Component Value Date/Time   CALCIUM 9.4 03/28/2021 1401   ALKPHOS 170 (H) 03/28/2021 1401   AST 30 03/28/2021 1401   ALT  34 03/28/2021 1401   BILITOT 1.0 03/28/2021 1401       RADIOGRAPHIC STUDIES: CT Chest W Contrast  Result Date: 03/31/2021 CLINICAL DATA:  60 year old male with history of non-small cell lung cancer. Follow-up study for restaging. EXAM: CT CHEST, ABDOMEN AND PELVIS WITHOUT CONTRAST TECHNIQUE: Multidetector CT imaging of the chest, abdomen and pelvis was performed following the standard protocol without IV contrast. COMPARISON:  Multiple priors, most recently 12/31/2020. FINDINGS: CT CHEST FINDINGS Cardiovascular: Heart size is normal. There is no significant pericardial fluid, thickening or pericardial calcification. There is aortic atherosclerosis, as well as atherosclerosis of the great vessels of the mediastinum and the coronary arteries, including calcified atherosclerotic plaque in the left main  and left anterior descending coronary arteries. Mild calcifications of the aortic valve. Mediastinum/Nodes: No pathologically enlarged mediastinal or hilar lymph nodes. Esophagus is unremarkable in appearance. No axillary lymphadenopathy. Lungs/Pleura: Chronic moderate to large right pleural effusion stable in size compared to the prior study lying dependently. There continues to be chronic areas of architectural distortion and volume loss in the perihilar aspect of the right lung, stable compared to prior examination, most compatible with areas of chronic postradiation mass-like fibrosis. No definite new suspicious appearing pulmonary nodules or masses are noted. No acute consolidative airspace disease. No left pleural effusion. Musculoskeletal: There are no aggressive appearing lytic or blastic lesions noted in the visualized portions of the skeleton. CT ABDOMEN PELVIS FINDINGS Hepatobiliary: No suspicious cystic or solid hepatic lesions. No intra or extrahepatic biliary ductal dilatation. Gallbladder is normal in appearance. Pancreas: No pancreatic mass. No pancreatic ductal dilatation. No pancreatic or peripancreatic fluid collections or inflammatory changes. Spleen: Unremarkable. Adrenals/Urinary Tract: 1.4 cm low-attenuation lesion in the upper pole of the left kidney, compatible with a small simple cyst. Right kidney and bilateral adrenal glands are normal in appearance. No hydroureteronephrosis. Urinary bladder is nearly decompressed, but otherwise unremarkable in appearance. Stomach/Bowel: The appearance of the stomach is normal. No pathologic dilatation of small bowel or colon. Normal appendix. Vascular/Lymphatic: Aortic atherosclerosis, without evidence of aneurysm or dissection in the abdominal or pelvic vasculature. No lymphadenopathy noted in the abdomen or pelvis. Reproductive: Prostate gland and seminal vesicles are unremarkable in appearance. Other: No significant volume of ascites.  No  pneumoperitoneum. Musculoskeletal: There are no aggressive appearing lytic or blastic lesions noted in the visualized portions of the skeleton. IMPRESSION: 1. Stable post treatment related changes of postradiation mass-like fibrosis in the perihilar aspect of the right lung with stable chronic moderate to large right pleural effusion. No definitive findings to suggest locally recurrent disease or definite metastatic disease in the chest, abdomen or pelvis. 2. Aortic atherosclerosis, in addition to left main and left anterior descending coronary artery disease. Please note that although the presence of coronary artery calcium documents the presence of coronary artery disease, the severity of this disease and any potential stenosis cannot be assessed on this non-gated CT examination. Assessment for potential risk factor modification, dietary therapy or pharmacologic therapy may be warranted, if clinically indicated. 3. There are calcifications of the aortic valve. Echocardiographic correlation for evaluation of potential valvular dysfunction may be warranted if clinically indicated. Electronically Signed   By: Vinnie Langton M.D.   On: 03/31/2021 11:29   CT Abdomen Pelvis W Contrast  Result Date: 03/31/2021 CLINICAL DATA:  60 year old male with history of non-small cell lung cancer. Follow-up study for restaging. EXAM: CT CHEST, ABDOMEN AND PELVIS WITHOUT CONTRAST TECHNIQUE: Multidetector CT imaging of the chest, abdomen and pelvis was performed  following the standard protocol without IV contrast. COMPARISON:  Multiple priors, most recently 12/31/2020. FINDINGS: CT CHEST FINDINGS Cardiovascular: Heart size is normal. There is no significant pericardial fluid, thickening or pericardial calcification. There is aortic atherosclerosis, as well as atherosclerosis of the great vessels of the mediastinum and the coronary arteries, including calcified atherosclerotic plaque in the left main and left anterior descending  coronary arteries. Mild calcifications of the aortic valve. Mediastinum/Nodes: No pathologically enlarged mediastinal or hilar lymph nodes. Esophagus is unremarkable in appearance. No axillary lymphadenopathy. Lungs/Pleura: Chronic moderate to large right pleural effusion stable in size compared to the prior study lying dependently. There continues to be chronic areas of architectural distortion and volume loss in the perihilar aspect of the right lung, stable compared to prior examination, most compatible with areas of chronic postradiation mass-like fibrosis. No definite new suspicious appearing pulmonary nodules or masses are noted. No acute consolidative airspace disease. No left pleural effusion. Musculoskeletal: There are no aggressive appearing lytic or blastic lesions noted in the visualized portions of the skeleton. CT ABDOMEN PELVIS FINDINGS Hepatobiliary: No suspicious cystic or solid hepatic lesions. No intra or extrahepatic biliary ductal dilatation. Gallbladder is normal in appearance. Pancreas: No pancreatic mass. No pancreatic ductal dilatation. No pancreatic or peripancreatic fluid collections or inflammatory changes. Spleen: Unremarkable. Adrenals/Urinary Tract: 1.4 cm low-attenuation lesion in the upper pole of the left kidney, compatible with a small simple cyst. Right kidney and bilateral adrenal glands are normal in appearance. No hydroureteronephrosis. Urinary bladder is nearly decompressed, but otherwise unremarkable in appearance. Stomach/Bowel: The appearance of the stomach is normal. No pathologic dilatation of small bowel or colon. Normal appendix. Vascular/Lymphatic: Aortic atherosclerosis, without evidence of aneurysm or dissection in the abdominal or pelvic vasculature. No lymphadenopathy noted in the abdomen or pelvis. Reproductive: Prostate gland and seminal vesicles are unremarkable in appearance. Other: No significant volume of ascites.  No pneumoperitoneum. Musculoskeletal: There  are no aggressive appearing lytic or blastic lesions noted in the visualized portions of the skeleton. IMPRESSION: 1. Stable post treatment related changes of postradiation mass-like fibrosis in the perihilar aspect of the right lung with stable chronic moderate to large right pleural effusion. No definitive findings to suggest locally recurrent disease or definite metastatic disease in the chest, abdomen or pelvis. 2. Aortic atherosclerosis, in addition to left main and left anterior descending coronary artery disease. Please note that although the presence of coronary artery calcium documents the presence of coronary artery disease, the severity of this disease and any potential stenosis cannot be assessed on this non-gated CT examination. Assessment for potential risk factor modification, dietary therapy or pharmacologic therapy may be warranted, if clinically indicated. 3. There are calcifications of the aortic valve. Echocardiographic correlation for evaluation of potential valvular dysfunction may be warranted if clinically indicated. Electronically Signed   By: Vinnie Langton M.D.   On: 03/31/2021 11:29     ASSESSMENT AND PLAN: This is a very pleasant 60 years old white male with metastatic non-small cell lung cancer, adenocarcinoma now with ALK gene translocation.  The patient was initially diagnosed as a stage IIIa non-small cell lung cancer status post a course of concurrent chemoradiation with weekly carboplatin and paclitaxel followed by 1 year of consolidation treatment with immunotherapy with Imfinzi. He had evidence for disease progression recently. Repeat tissue biopsy from the left supraclavicular lymph nodes and molecular studies showed that the patient has positive ALK gene translocation.  His previous molecular studies by [employer] was negative. I had a lengthy  discussion with the patient today about his condition and treatment options. He received 1 cycle of systemic chemotherapy  with carboplatin, Alimta and Keytruda.  We will discontinue his systemic chemotherapy for now because of the new findings on the molecular studies. The patient is currently on treatment with [name] 600 mg p.o. twice daily.  He is status post 13 months of treatment.   The patient continues to tolerate this treatment well with no concerning adverse effects. He had repeat CT scan of the chest, abdomen pelvis performed recently.  I personally and independently reviewed the scans and discussed the results with the patient today. His scan showed no concerning findings for disease progression and he continues to have stable moderate right pleural effusion. I recommended for the patient to continue his current treatment with [name] with the same dose. I will see him back for follow-up visit in 2 months for evaluation and repeat blood work. For the moderate right pleural effusion, he is currently asymptomatic and we will continue to monitor it closely and consider The patient for ultrasound-guided thoracentesis if needed. For the hypothyroidism, he is currently on treatment with levothyroxine followed by his primary care physician. For the history of pulmonary embolism he will continue his treatment with Eliquis for at least a total of 1 year and we will discuss at that time any further duration of treatment. The patient was advised to call immediately if he has any other concerning symptoms in the interval. The patient voices understanding of current disease status and treatment options and is in agreement with the current care plan.  All questions were answered. The patient knows to call the clinic with any problems, questions or concerns. We can certainly see the patient much sooner if necessary.  Disclaimer: This note was dictated with voice recognition software. Similar sounding words can inadvertently be transcribed and may not be corrected upon review.

## 2021-04-01 ENCOUNTER — Telehealth: Payer: Self-pay | Admitting: Internal Medicine

## 2021-04-01 NOTE — Telephone Encounter (Signed)
Mailed letter with appt date and time per 8/29 los

## 2021-04-09 ENCOUNTER — Other Ambulatory Visit: Payer: Self-pay | Admitting: Physician Assistant

## 2021-04-10 ENCOUNTER — Encounter: Payer: Self-pay | Admitting: Internal Medicine

## 2021-04-10 ENCOUNTER — Other Ambulatory Visit: Payer: Self-pay | Admitting: Physician Assistant

## 2021-04-10 DIAGNOSIS — I824Y3 Acute embolism and thrombosis of unspecified deep veins of proximal lower extremity, bilateral: Secondary | ICD-10-CM

## 2021-04-10 MED ORDER — APIXABAN 5 MG PO TABS: 5.0000 mg | ORAL_TABLET | Freq: Two times a day (BID) | ORAL | 2 refills | Status: DC

## 2021-05-23 ENCOUNTER — Encounter: Payer: Self-pay | Admitting: Internal Medicine

## 2021-05-27 ENCOUNTER — Inpatient Hospital Stay: Payer: BC Managed Care – PPO | Attending: Internal Medicine | Admitting: Internal Medicine

## 2021-05-27 ENCOUNTER — Inpatient Hospital Stay: Payer: BC Managed Care – PPO

## 2021-05-27 ENCOUNTER — Encounter: Payer: Self-pay | Admitting: Internal Medicine

## 2021-05-27 ENCOUNTER — Other Ambulatory Visit: Payer: Self-pay

## 2021-05-27 VITALS — BP 130/79 | HR 94 | Temp 97.2°F | Resp 18 | Wt 191.2 lb

## 2021-05-27 DIAGNOSIS — D649 Anemia, unspecified: Secondary | ICD-10-CM | POA: Diagnosis not present

## 2021-05-27 DIAGNOSIS — C7931 Secondary malignant neoplasm of brain: Secondary | ICD-10-CM | POA: Diagnosis not present

## 2021-05-27 DIAGNOSIS — Z9221 Personal history of antineoplastic chemotherapy: Secondary | ICD-10-CM | POA: Insufficient documentation

## 2021-05-27 DIAGNOSIS — C3490 Malignant neoplasm of unspecified part of unspecified bronchus or lung: Secondary | ICD-10-CM

## 2021-05-27 DIAGNOSIS — Z7984 Long term (current) use of oral hypoglycemic drugs: Secondary | ICD-10-CM | POA: Insufficient documentation

## 2021-05-27 DIAGNOSIS — C3491 Malignant neoplasm of unspecified part of right bronchus or lung: Secondary | ICD-10-CM

## 2021-05-27 DIAGNOSIS — Z7901 Long term (current) use of anticoagulants: Secondary | ICD-10-CM | POA: Diagnosis not present

## 2021-05-27 DIAGNOSIS — Z5111 Encounter for antineoplastic chemotherapy: Secondary | ICD-10-CM

## 2021-05-27 DIAGNOSIS — C3411 Malignant neoplasm of upper lobe, right bronchus or lung: Secondary | ICD-10-CM | POA: Insufficient documentation

## 2021-05-27 DIAGNOSIS — E039 Hypothyroidism, unspecified: Secondary | ICD-10-CM | POA: Diagnosis not present

## 2021-05-27 DIAGNOSIS — Z86711 Personal history of pulmonary embolism: Secondary | ICD-10-CM | POA: Insufficient documentation

## 2021-05-27 DIAGNOSIS — Z79899 Other long term (current) drug therapy: Secondary | ICD-10-CM | POA: Diagnosis not present

## 2021-05-27 DIAGNOSIS — C770 Secondary and unspecified malignant neoplasm of lymph nodes of head, face and neck: Secondary | ICD-10-CM | POA: Diagnosis present

## 2021-05-27 DIAGNOSIS — Z923 Personal history of irradiation: Secondary | ICD-10-CM | POA: Diagnosis not present

## 2021-05-27 LAB — CMP (CANCER CENTER ONLY)
ALT: 33 U/L (ref 0–44)
AST: 27 U/L (ref 15–41)
Albumin: 3.8 g/dL (ref 3.5–5.0)
Alkaline Phosphatase: 133 U/L — ABNORMAL HIGH (ref 38–126)
Anion gap: 8 (ref 5–15)
BUN: 18 mg/dL (ref 6–20)
CO2: 25 mmol/L (ref 22–32)
Calcium: 8.8 mg/dL — ABNORMAL LOW (ref 8.9–10.3)
Chloride: 106 mmol/L (ref 98–111)
Creatinine: 1.11 mg/dL (ref 0.61–1.24)
GFR, Estimated: >60 mL/min
Glucose, Bld: 138 mg/dL — ABNORMAL HIGH (ref 70–99)
Potassium: 3.8 mmol/L (ref 3.5–5.1)
Sodium: 139 mmol/L (ref 135–145)
Total Bilirubin: 0.8 mg/dL (ref 0.3–1.2)
Total Protein: 6.5 g/dL (ref 6.5–8.1)

## 2021-05-27 LAB — CBC WITH DIFFERENTIAL (CANCER CENTER ONLY)
Abs Immature Granulocytes: 0.12 10*3/uL — ABNORMAL HIGH (ref 0.00–0.07)
Basophils Absolute: 0.1 10*3/uL (ref 0.0–0.1)
Basophils Relative: 1 %
Eosinophils Absolute: 0.3 10*3/uL (ref 0.0–0.5)
Eosinophils Relative: 3 %
HCT: 35.6 % — ABNORMAL LOW (ref 39.0–52.0)
Hemoglobin: 12.4 g/dL — ABNORMAL LOW (ref 13.0–17.0)
Immature Granulocytes: 1 %
Lymphocytes Relative: 12 %
Lymphs Abs: 1.3 10*3/uL (ref 0.7–4.0)
MCH: 32.2 pg (ref 26.0–34.0)
MCHC: 34.8 g/dL (ref 30.0–36.0)
MCV: 92.5 fL (ref 80.0–100.0)
Monocytes Absolute: 1.3 10*3/uL — ABNORMAL HIGH (ref 0.1–1.0)
Monocytes Relative: 12 %
Neutro Abs: 7.4 10*3/uL (ref 1.7–7.7)
Neutrophils Relative %: 71 %
Platelet Count: 265 10*3/uL (ref 150–400)
RBC: 3.85 MIL/uL — ABNORMAL LOW (ref 4.22–5.81)
RDW: 17.2 % — ABNORMAL HIGH (ref 11.5–15.5)
WBC Count: 10.5 10*3/uL (ref 4.0–10.5)
nRBC: 0 % (ref 0.0–0.2)

## 2021-05-27 NOTE — Progress Notes (Signed)
[name] [phone]   [phone]  OFFICE PROGRESS NOTE  [name], MD [place] [address]  DIAGNOSIS:  Metastatic non-small cell lung cancer initially diagnosed as stage IIIB (T1c, N3, M0) non-small cell lung cancer, adenocarcinoma presented with right upper lobe lung mass in addition to mediastinal and bilateral supraclavicular lymphadenopathy diagnosed in January 2020.  He had evidence of disease recurrence in June 2021 with adenopathy in the subcarinal, level 3 left neck lymph node, and lymph node at the curious of the diaphragm and in the upper abdomen.   PD-L1: 10%   Guardant 360 molecular studies showed no actionable mutation   [facility] Testing:   Biomarker Findings Microsatellite status - MS-Stable Tumor Mutational Burden - 4 Muts/Mb Genomic Findings For a complete list of the genes assayed, please refer to the Appendix. ALK EML4-ALK fusion (Variant 2) SMARCB1 R377C CTNNB1 S45P CDKN2A/B CDKN2B loss, CDKN2A loss CXCR4 O973* ZH29 splice site 924Q>A 7 Disease relevant genes with no reportable alterations: BRAF, EGFR, ERBB2, KRAS, MET, RET, ROS1  PRIOR THERAPY: 1) Concurrent chemoradiation with weekly carboplatin for AUC of 2 and paclitaxel 45 mg/M2.  Status post 6 cycles.  Last dose was given on October 10, 2018 with stable disease. 2) Radiation treatment to the enlarging right cervical lymph nodes under the care of Dr. Sondra Come. First treatment 03/06/2019. Last treatment scheduled on 04/13/2019 3) Consolidation treatment with immunotherapy with Imfinzi 10 mg/KG every 2 weeks.  First dose November 17, 2018.  Status post 26 cycles. 4) Systemic chemotherapy with carboplatin for an AUC of 5, Alimta 500 mg/m2, and Keytruda 200 mg IV every 3 weeks. First dose expected on 02/14/20. Status post 1 cycle.  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 5) SBRT to the left lower lobe lung nodule.   CURRENT THERAPY:  [name] ([name]) 600 mg p.o. twice daily.  He started the first dose on March 08, 2020.   Status post 15 months of treatment.  INTERVAL HISTORY: Brian [name] 60 y.o. male returns to the clinic today for 70-monthfollow-up visit.  The patient is feeling fine today with no concerning complaints except for shortness of breath with exertion.  He denied having any current chest pain, cough or hemoptysis.  He denied having any fever or chills.  He has no nausea, vomiting, diarrhea or constipation.  He has no headache or visual changes.  He denied having any significant weight loss or night sweats.  He has been tolerating his treatment with [name] fairly well.  MEDICAL HISTORY: Past Medical History:  Diagnosis Date   COPD (chronic obstructive pulmonary disease) ([facility]    Diabetes mellitus without complication ([facility]    Hypertension    nscl ca dx'd 08/2018   Pneumonia    Subarachnoid hemorrhage ([facility])     ALLERGIES:  has No Known Allergies.  MEDICATIONS:  Current Outpatient Medications  Medication Sig Dispense Refill   albuterol (VENTOLIN HFA) 108 (90 Base) MCG/ACT inhaler INHALE 2 PUFFS INTO THE LUNGS EVERY 4 (FOUR) HOURS AS NEEDED FOR WHEEZING OR SHORTNESS OF BREATH. 6.7 each 4   [name] 150 MG capsule TAKE 4 CAPSULES BY MOUTH TWICE DAILY WITH FOOD. SWALLOW CAPSULES WHOLE. DO NOT OPEN OR CRUSH. STORE IN ORIGINAL CONTAINER. 240 capsule 2   amLODipine (NORVASC) 5 MG tablet Take 5 mg by mouth daily.  atorvastatin (LIPITOR) 20 MG tablet Take 20 mg by mouth daily.      B Complex Vitamins (B COMPLEX PO) Take 1 tablet by mouth daily.     ELIQUIS 5 MG TABS tablet TAKE 1 TABLET BY MOUTH TWICE A DAY 60 tablet 2   levothyroxine (SYNTHROID) 100 MCG tablet TAKE 1 TABLET BY MOUTH DAILY BEFORE BREAKFAST. 90 tablet 1   metFORMIN (GLUCOPHAGE) 500 MG tablet Take 500 mg by mouth daily with breakfast.       Multiple Vitamin (MULTIVITAMIN) tablet Take 1 tablet by mouth daily.     Respiratory Therapy Supplies (FLUTTER) DEVI Please use up 10 times daily(4-5 breaths, 4-5 times daily) 1 each 0   TRELEGY ELLIPTA 100-62.5-25 MCG/INH AEPB Inhale 1 puff into the lungs daily.     venlafaxine (EFFEXOR) 37.5 MG tablet Take 37.5 mg by mouth daily.      No current facility-administered medications for this visit.    SURGICAL HISTORY:  Past Surgical History:  Procedure Laterality Date   INGUINAL HERNIA REPAIR Right 06/24/2020   Procedure: RIGHT OPEN INGUINAL HERNIA REPAIR WITH MESH;  Surgeon: Kinsinger, Arta Bruce, MD;  Location: [facility];  Service: General;  Laterality: Right;   IR IVC FILTER PLMT / S&I [facility]els GUID/MOD SED  03/04/2020   IR IVC FILTER RETRIEVAL / S&I [facility] Keels GUID/MOD SED  08/15/2020   IR RADIOLOGIST EVAL & MGMT  08/06/2020   LEG SURGERY  age 39    left leg, from fracture    REVIEW OF SYSTEMS:  A comprehensive review of systems was negative except for: Respiratory: positive for dyspnea on exertion   PHYSICAL EXAMINATION: General appearance: alert, cooperative, fatigued, and no distress Head: Normocephalic, without obvious abnormality, atraumatic Neck: no adenopathy, no JVD, supple, symmetrical, trachea midline, and thyroid not enlarged, symmetric, no tenderness/mass/nodules Lymph nodes: Cervical, supraclavicular, and axillary nodes normal. Resp: diminished breath sounds RLL and dullness to percussion RLL Back: symmetric, no curvature. ROM normal. No CVA tenderness. Cardio: regular rate and rhythm, S1, S2 normal, no murmur, click, rub or gallop GI: soft, non-tender; bowel sounds normal; no masses,  no organomegaly Extremities: extremities normal, atraumatic, no cyanosis or edema  ECOG PERFORMANCE STATUS: 1 - Symptomatic but completely ambulatory  Blood pressure 130/79, pulse 94, temperature (!) 97.2 F (36.2 C), temperature source Tympanic, resp. rate 18, weight 191 lb 3.2 oz (86.7 kg), SpO2 96  %.  LABORATORY DATA: Lab Results  Component Value Date   WBC 10.5 05/27/2021   HGB 12.4 (L) 05/27/2021   HCT 35.6 (L) 05/27/2021   MCV 92.5 05/27/2021   PLT 265 05/27/2021      Chemistry      Component Value Date/Time   NA 139 03/28/2021 1401   K 3.9 03/28/2021 1401   CL 103 03/28/2021 1401   CO2 26 03/28/2021 1401   BUN 13 03/28/2021 1401   CREATININE 1.10 03/28/2021 1401      Component Value Date/Time   CALCIUM 9.4 03/28/2021 1401   ALKPHOS 170 (H) 03/28/2021 1401   AST 30 03/28/2021 1401   ALT 34 03/28/2021 1401   BILITOT 1.0 03/28/2021 1401       RADIOGRAPHIC STUDIES: No results found.   ASSESSMENT AND PLAN: This is a very pleasant 60 years old white male with metastatic non-small cell lung cancer, adenocarcinoma now with ALK gene translocation.  The patient was initially diagnosed as a stage IIIa non-small cell lung cancer status post a course of concurrent chemoradiation with weekly carboplatin and paclitaxel followed  by 1 year of consolidation treatment with immunotherapy with Imfinzi. He had evidence for disease progression recently. Repeat tissue biopsy from the left supraclavicular lymph nodes and molecular studies showed that the patient has positive ALK gene translocation.  His previous molecular studies by [employer] 360 was negative. I had a lengthy discussion with the patient today about his condition and treatment options. He received 1 cycle of systemic chemotherapy with carboplatin, Alimta and Keytruda.  We will discontinue his systemic chemotherapy for now because of the new findings on the molecular studies. The patient is currently on treatment with [name] 600 mg p.o. twice daily.  He is status post 15 months of treatment.   The patient has been tolerating his treatment with [name] fairly well except for the weight gain. His CBC today is stable with mild anemia. I recommended for him to continue his current treatment with [name] with the same  dose. I will see him back for follow-up visit in 2 months for evaluation with repeat CT scan of the chest, abdomen pelvis for restaging of his disease. For the recurrent right pleural effusion, I will order chest x-ray and if the patient has worsening of his right pleural effusion, will consider him for ultrasound-guided thoracentesis. For the history of hypothyroidism, he will continue his current treatment with levothyroxine. For the history of pulmonary embolism the patient is currently on Eliquis. He was advised to call immediately if he has any other concerning symptoms in the interval. The patient voices understanding of current disease status and treatment options and is in agreement with the current care plan.  All questions were answered. The patient knows to call the clinic with any problems, questions or concerns. We can certainly see the patient much sooner if necessary.  Disclaimer: This note was dictated with voice recognition software. Similar sounding words can inadvertently be transcribed and may not be corrected upon review.

## 2021-05-28 ENCOUNTER — Ambulatory Visit (HOSPITAL_COMMUNITY)
Admission: RE | Admit: 2021-05-28 | Discharge: 2021-05-28 | Disposition: A | Discharge status: Home / Self Care | Payer: BC Managed Care – PPO | Source: Ambulatory Visit | Attending: Internal Medicine | Admitting: Internal Medicine

## 2021-05-28 ENCOUNTER — Telehealth: Payer: Self-pay | Admitting: Internal Medicine

## 2021-05-28 DIAGNOSIS — C3491 Malignant neoplasm of unspecified part of right bronchus or lung: Secondary | ICD-10-CM | POA: Insufficient documentation

## 2021-05-28 NOTE — Telephone Encounter (Signed)
Left message with follow-up appointments per 10/25 los.

## 2021-05-30 ENCOUNTER — Telehealth: Payer: Self-pay | Admitting: Physician Assistant

## 2021-05-30 NOTE — Telephone Encounter (Signed)
The patient had a repeat chest x-ray performed to evaluate for recurrent pleural effusion.  The x-ray did not show recurrent effusion.  I called the patient and left a voicemail letting him know that he does not need a thoracentesis at this time.  It appears that the patient's next follow-up visit is in 2 months.  Noted on the voicemail that if he develops any worsening symptoms suggestive of pleural effusion that he is free to call us back in the interval for re-evaluation of his fluid.

## 2021-07-02 ENCOUNTER — Other Ambulatory Visit: Payer: Self-pay | Admitting: Physician Assistant

## 2021-07-02 DIAGNOSIS — I824Y3 Acute embolism and thrombosis of unspecified deep veins of proximal lower extremity, bilateral: Secondary | ICD-10-CM

## 2021-07-16 ENCOUNTER — Other Ambulatory Visit: Payer: Self-pay | Admitting: Internal Medicine

## 2021-07-16 DIAGNOSIS — C3491 Malignant neoplasm of unspecified part of right bronchus or lung: Secondary | ICD-10-CM

## 2021-07-21 ENCOUNTER — Encounter (HOSPITAL_COMMUNITY): Payer: Self-pay

## 2021-07-21 ENCOUNTER — Other Ambulatory Visit: Payer: Self-pay

## 2021-07-21 ENCOUNTER — Inpatient Hospital Stay: Payer: BC Managed Care – PPO | Attending: Internal Medicine

## 2021-07-21 ENCOUNTER — Ambulatory Visit (HOSPITAL_COMMUNITY)
Admission: RE | Admit: 2021-07-21 | Discharge: 2021-07-21 | Disposition: A | Discharge status: Home / Self Care | Payer: BC Managed Care – PPO | Source: Ambulatory Visit | Attending: Internal Medicine | Admitting: Internal Medicine

## 2021-07-21 DIAGNOSIS — C3490 Malignant neoplasm of unspecified part of unspecified bronchus or lung: Secondary | ICD-10-CM

## 2021-07-21 DIAGNOSIS — N281 Cyst of kidney, acquired: Secondary | ICD-10-CM | POA: Diagnosis not present

## 2021-07-21 DIAGNOSIS — Z79899 Other long term (current) drug therapy: Secondary | ICD-10-CM | POA: Insufficient documentation

## 2021-07-21 DIAGNOSIS — E039 Hypothyroidism, unspecified: Secondary | ICD-10-CM | POA: Insufficient documentation

## 2021-07-21 DIAGNOSIS — Z86711 Personal history of pulmonary embolism: Secondary | ICD-10-CM | POA: Insufficient documentation

## 2021-07-21 DIAGNOSIS — C3491 Malignant neoplasm of unspecified part of right bronchus or lung: Secondary | ICD-10-CM

## 2021-07-21 DIAGNOSIS — Z923 Personal history of irradiation: Secondary | ICD-10-CM | POA: Insufficient documentation

## 2021-07-21 DIAGNOSIS — C770 Secondary and unspecified malignant neoplasm of lymph nodes of head, face and neck: Secondary | ICD-10-CM | POA: Insufficient documentation

## 2021-07-21 DIAGNOSIS — I10 Essential (primary) hypertension: Secondary | ICD-10-CM | POA: Insufficient documentation

## 2021-07-21 DIAGNOSIS — J90 Pleural effusion, not elsewhere classified: Secondary | ICD-10-CM | POA: Diagnosis not present

## 2021-07-21 DIAGNOSIS — E119 Type 2 diabetes mellitus without complications: Secondary | ICD-10-CM | POA: Insufficient documentation

## 2021-07-21 DIAGNOSIS — C3411 Malignant neoplasm of upper lobe, right bronchus or lung: Secondary | ICD-10-CM | POA: Insufficient documentation

## 2021-07-21 DIAGNOSIS — Z7901 Long term (current) use of anticoagulants: Secondary | ICD-10-CM | POA: Insufficient documentation

## 2021-07-21 DIAGNOSIS — Z7984 Long term (current) use of oral hypoglycemic drugs: Secondary | ICD-10-CM | POA: Insufficient documentation

## 2021-07-21 DIAGNOSIS — K4020 Bilateral inguinal hernia, without obstruction or gangrene, not specified as recurrent: Secondary | ICD-10-CM | POA: Diagnosis not present

## 2021-07-21 LAB — CMP (CANCER CENTER ONLY)
ALT: 38 U/L (ref 0–44)
AST: 31 U/L (ref 15–41)
Albumin: 4.1 g/dL (ref 3.5–5.0)
Alkaline Phosphatase: 148 U/L — ABNORMAL HIGH (ref 38–126)
Anion gap: 8 (ref 5–15)
BUN: 13 mg/dL (ref 6–20)
CO2: 23 mmol/L (ref 22–32)
Calcium: 8.6 mg/dL — ABNORMAL LOW (ref 8.9–10.3)
Chloride: 109 mmol/L (ref 98–111)
Creatinine: 0.92 mg/dL (ref 0.61–1.24)
GFR, Estimated: >60 mL/min (ref >60)
Glucose, Bld: 88 mg/dL (ref 70–99)
Potassium: 3.8 mmol/L (ref 3.5–5.1)
Sodium: 140 mmol/L (ref 135–145)
Total Bilirubin: 0.9 mg/dL (ref 0.3–1.2)
Total Protein: 7 g/dL (ref 6.5–8.1)

## 2021-07-21 LAB — CBC WITH DIFFERENTIAL (CANCER CENTER ONLY)
Abs Immature Granulocytes: 0.1 10*3/uL — ABNORMAL HIGH (ref 0.00–0.07)
Basophils Absolute: 0.2 10*3/uL — ABNORMAL HIGH (ref 0.0–0.1)
Basophils Relative: 1 %
Eosinophils Absolute: 0.3 10*3/uL (ref 0.0–0.5)
Eosinophils Relative: 2 %
HCT: 38.4 % — ABNORMAL LOW (ref 39.0–52.0)
Hemoglobin: 13.2 g/dL (ref 13.0–17.0)
Immature Granulocytes: 1 %
Lymphocytes Relative: 12 %
Lymphs Abs: 1.4 10*3/uL (ref 0.7–4.0)
MCH: 32.4 pg (ref 26.0–34.0)
MCHC: 34.4 g/dL (ref 30.0–36.0)
MCV: 94.3 fL (ref 80.0–100.0)
Monocytes Absolute: 1.5 10*3/uL — ABNORMAL HIGH (ref 0.1–1.0)
Monocytes Relative: 13 %
Neutro Abs: 8.1 10*3/uL — ABNORMAL HIGH (ref 1.7–7.7)
Neutrophils Relative %: 71 %
Platelet Count: 276 10*3/uL (ref 150–400)
RBC: 4.07 MIL/uL — ABNORMAL LOW (ref 4.22–5.81)
RDW: 16 % — ABNORMAL HIGH (ref 11.5–15.5)
WBC Count: 11.6 10*3/uL — ABNORMAL HIGH (ref 4.0–10.5)
nRBC: 0 % (ref 0.0–0.2)

## 2021-07-21 MED ORDER — IOHEXOL 350 MG/ML SOLN
75.0000 mL | Freq: Once | INTRAVENOUS | Status: AC | PRN
  Administered 2021-07-21: 11:00:00 75 mL via INTRAVENOUS

## 2021-07-21 MED ORDER — SODIUM CHLORIDE (PF) 0.9 % IJ SOLN
INTRAMUSCULAR | Status: AC
  Filled 2021-07-21: qty 50

## 2021-07-23 ENCOUNTER — Other Ambulatory Visit: Payer: Self-pay

## 2021-07-23 ENCOUNTER — Inpatient Hospital Stay (HOSPITAL_BASED_OUTPATIENT_CLINIC_OR_DEPARTMENT_OTHER): Payer: BC Managed Care – PPO | Admitting: Internal Medicine

## 2021-07-23 ENCOUNTER — Encounter: Payer: Self-pay | Admitting: Internal Medicine

## 2021-07-23 VITALS — BP 116/79 | HR 77 | Temp 97.1°F | Resp 18 | Ht 68.0 in | Wt 191.6 lb

## 2021-07-23 DIAGNOSIS — Z86711 Personal history of pulmonary embolism: Secondary | ICD-10-CM | POA: Diagnosis not present

## 2021-07-23 DIAGNOSIS — I10 Essential (primary) hypertension: Secondary | ICD-10-CM | POA: Diagnosis not present

## 2021-07-23 DIAGNOSIS — E119 Type 2 diabetes mellitus without complications: Secondary | ICD-10-CM | POA: Diagnosis not present

## 2021-07-23 DIAGNOSIS — C3491 Malignant neoplasm of unspecified part of right bronchus or lung: Secondary | ICD-10-CM

## 2021-07-23 DIAGNOSIS — Z5111 Encounter for antineoplastic chemotherapy: Secondary | ICD-10-CM

## 2021-07-23 DIAGNOSIS — Z7901 Long term (current) use of anticoagulants: Secondary | ICD-10-CM | POA: Diagnosis not present

## 2021-07-23 DIAGNOSIS — Z7984 Long term (current) use of oral hypoglycemic drugs: Secondary | ICD-10-CM | POA: Diagnosis not present

## 2021-07-23 DIAGNOSIS — E039 Hypothyroidism, unspecified: Secondary | ICD-10-CM | POA: Diagnosis not present

## 2021-07-23 DIAGNOSIS — Z79899 Other long term (current) drug therapy: Secondary | ICD-10-CM | POA: Diagnosis not present

## 2021-07-23 DIAGNOSIS — Z923 Personal history of irradiation: Secondary | ICD-10-CM | POA: Diagnosis not present

## 2021-07-23 DIAGNOSIS — J90 Pleural effusion, not elsewhere classified: Secondary | ICD-10-CM

## 2021-07-23 DIAGNOSIS — C770 Secondary and unspecified malignant neoplasm of lymph nodes of head, face and neck: Secondary | ICD-10-CM | POA: Diagnosis not present

## 2021-07-23 DIAGNOSIS — C3411 Malignant neoplasm of upper lobe, right bronchus or lung: Secondary | ICD-10-CM | POA: Diagnosis not present

## 2021-07-23 NOTE — Progress Notes (Signed)
[facility] [phone]   Fax:(336) [phone]  OFFICE PROGRESS NOTE  Brian Broker, MD [place] [address]  DIAGNOSIS:  Metastatic non-small cell lung cancer initially diagnosed as stage IIIB (T1c, N3, M0) non-small cell lung cancer, adenocarcinoma presented with right upper lobe lung mass in addition to mediastinal and bilateral supraclavicular lymphadenopathy diagnosed in January 2020.  He had evidence of disease recurrence in June 2021 with adenopathy in the subcarinal, level 3 left neck lymph node, and lymph node at the curious of the diaphragm and in the upper abdomen.   PD-L1: 10%   Guardant 360 molecular studies showed no actionable mutation   Foundation One Testing:   Biomarker Findings Microsatellite status - MS-Stable Tumor Mutational Burden - 4 Muts/Mb Genomic Findings For a complete list of the genes assayed, please refer to the Appendix. ALK EML4-ALK fusion (Variant 2) SMARCB1 R377C CTNNB1 S45P CDKN2A/B CDKN2B loss, CDKN2A loss CXCR4 M010* UV25 splice site 366Y>Q 7 Disease relevant genes with no reportable alterations: BRAF, EGFR, ERBB2, KRAS, MET, RET, ROS1  PRIOR THERAPY: 1) Concurrent chemoradiation with weekly carboplatin for AUC of 2 and paclitaxel 45 mg/M2.  Status post 6 cycles.  Last dose was given on October 10, 2018 with stable disease. 2) Radiation treatment to the enlarging right cervical lymph nodes under the care of Dr. Sondra Come. First treatment 03/06/2019. Last treatment scheduled on 04/13/2019 3) Consolidation treatment with immunotherapy with Imfinzi 10 mg/KG every 2 weeks.  First dose November 17, 2018.  Status post 26 cycles. 4) Systemic chemotherapy with carboplatin for an AUC of 5, Alimta 500 mg/m2, and Keytruda 200 mg IV every 3 weeks. First dose expected on 02/14/20. Status post 1 cycle.  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 5) SBRT to the left lower lobe lung nodule.   CURRENT THERAPY:  [name] ([name]) 600 mg p.o. twice daily.  He started the first dose on March 08, 2020.   Status post 17 months of treatment.  INTERVAL HISTORY: Brian [name] 60 y.o. male returns to the clinic today for follow-up visit.  The patient is feeling fine today with no concerning complaints except for shortness of breath with exertion.  He is starting a new physical fitness program soon.  He denied having any current chest pain, cough or hemoptysis.  He denied having any fever or chills.  He has no nausea, vomiting, diarrhea or constipation.  He has no headache or visual changes.  He denied having any recent weight loss or night sweats.  He had repeat CT scan of the chest, abdomen pelvis performed recently and he is here for evaluation and discussion of his discuss results.  MEDICAL HISTORY: Past Medical History:  Diagnosis Date   COPD (chronic obstructive pulmonary disease) ([facility])    Diabetes mellitus without complication ([facility])    Hypertension    nscl ca dx'd 08/2018   Pneumonia    Subarachnoid hemorrhage ([facility])     ALLERGIES:  has No Known Allergies.  MEDICATIONS:  Current Outpatient Medications  Medication Sig Dispense Refill   albuterol (VENTOLIN HFA) 108 (90 Base) MCG/ACT inhaler INHALE 2 PUFFS INTO THE LUNGS EVERY 4 (FOUR) HOURS AS NEEDED FOR WHEEZING OR SHORTNESS OF BREATH. 6.7 each 4   [name] 150 MG capsule TAKE 4 CAPSULES BY MOUTH TWICE DAILY WITH FOOD. SWALLOW CAPSULES WHOLE. DO NOT OPEN OR  CRUSH. STORE IN ORIGINAL CONTAINER 240 capsule 2   amLODipine (NORVASC) 5 MG tablet Take 5 mg by mouth daily.      atorvastatin (LIPITOR) 20 MG tablet Take 20 mg by mouth daily.      B Complex Vitamins (B COMPLEX PO) Take 1 tablet by mouth daily.     ELIQUIS 5 MG TABS tablet TAKE 1 TABLET BY MOUTH TWICE A DAY 60 tablet 2   levothyroxine (SYNTHROID) 100 MCG tablet TAKE 1 TABLET BY MOUTH DAILY BEFORE  BREAKFAST. 90 tablet 1   metFORMIN (GLUCOPHAGE) 500 MG tablet Take 500 mg by mouth daily with breakfast.      Multiple Vitamin (MULTIVITAMIN) tablet Take 1 tablet by mouth daily.     Respiratory Therapy Supplies (FLUTTER) DEVI Please use up 10 times daily(4-5 breaths, 4-5 times daily) 1 each 0   TRELEGY ELLIPTA 100-62.5-25 MCG/INH AEPB Inhale 1 puff into the lungs daily.     venlafaxine (EFFEXOR) 37.5 MG tablet Take 37.5 mg by mouth daily.      No current facility-administered medications for this visit.    SURGICAL HISTORY:  Past Surgical History:  Procedure Laterality Date   INGUINAL HERNIA REPAIR Right 06/24/2020   Procedure: RIGHT OPEN INGUINAL HERNIA REPAIR WITH MESH;  Surgeon: Kinsinger, Arta Bruce, MD;  Location: [facility];  Service: General;  Laterality: Right;   IR IVC FILTER PLMT / S&I Burke Keels GUID/MOD SED  03/04/2020   IR IVC FILTER RETRIEVAL / S&I Burke Keels GUID/MOD SED  08/15/2020   IR RADIOLOGIST EVAL & MGMT  08/06/2020   LEG SURGERY  age 51    left leg, from fracture    REVIEW OF SYSTEMS:  Constitutional: negative Eyes: negative Ears, nose, mouth, throat, and face: negative Respiratory: positive for dyspnea on exertion Cardiovascular: negative Gastrointestinal: negative Genitourinary:negative Integument/breast: negative Hematologic/lymphatic: negative Musculoskeletal:negative Neurological: negative Behavioral/Psych: negative Endocrine: negative Allergic/Immunologic: negative   PHYSICAL EXAMINATION: General appearance: alert, cooperative, fatigued, and no distress Head: Normocephalic, without obvious abnormality, atraumatic Neck: no adenopathy, no JVD, supple, symmetrical, trachea midline, and thyroid not enlarged, symmetric, no tenderness/mass/nodules Lymph nodes: Cervical, supraclavicular, and axillary nodes normal. Resp: diminished breath sounds RLL and dullness to percussion RLL Back: symmetric, no curvature. ROM normal. No CVA tenderness. Cardio: regular rate and rhythm,  S1, S2 normal, no murmur, click, rub or gallop GI: soft, non-tender; bowel sounds normal; no masses,  no organomegaly Extremities: extremities normal, atraumatic, no cyanosis or edema Neurologic: Alert and oriented X 3, normal strength and tone. Normal symmetric reflexes. Normal coordination and gait  ECOG PERFORMANCE STATUS: 1 - Symptomatic but completely ambulatory  Blood pressure 116/79, pulse 77, temperature (!) 97.1 F (36.2 C), temperature source Tympanic, resp. rate 18, height 5' 8" (1.727 m), weight 191 lb 9.6 oz (86.9 kg), SpO2 98 %.  LABORATORY DATA: Lab Results  Component Value Date   WBC 11.6 (H) 07/21/2021   HGB 13.2 07/21/2021   HCT 38.4 (L) 07/21/2021   MCV 94.3 07/21/2021   PLT 276 07/21/2021      Chemistry      Component Value Date/Time   NA 140 07/21/2021 0913   K 3.8 07/21/2021 0913   CL 109 07/21/2021 0913   CO2 23 07/21/2021 0913   BUN 13 07/21/2021 0913   CREATININE 0.92 07/21/2021 0913      Component Value Date/Time   CALCIUM 8.6 (L) 07/21/2021 0913   ALKPHOS 148 (H) 07/21/2021 0913   AST 31 07/21/2021 0913   ALT 38 07/21/2021 0913  0.9 07/21/2021 0913  °  ° ° ° °RADIOGRAPHIC STUDIES: °CT Chest W Contrast ° °Result Date: 07/21/2021 °CLINICAL DATA:  Non-small-cell lung cancer diagnosed in 2020. Radiation therapy complete. Immunotherapy complete. Chemotherapy in progress. Shortness of breath on exertion. EXAM: CT CHEST, ABDOMEN, AND PELVIS WITH CONTRAST TECHNIQUE: Multidetector CT imaging of the chest, abdomen and pelvis was performed following the standard protocol during bolus administration of intravenous contrast. CONTRAST:  75mL OMNIPAQUE IOHEXOL 350 MG/ML SOLN COMPARISON:  03/28/2021 FINDINGS: CT CHEST FINDINGS Cardiovascular: Aortic atherosclerosis. Normal heart size, without pericardial effusion. Lad coronary artery calcification. No central pulmonary embolism, on this non-dedicated study. Mediastinum/Nodes: No supraclavicular adenopathy. No  mediastinal or hilar adenopathy. Lungs/Pleura: A moderate right-sided pleural effusion is similar, without loculation or pleural thickening/nodularity. Medial right middle and right lower lobe radiation induced consolidation and volume loss are similar, without residual or recurrent disease. Musculoskeletal: No acute osseous abnormality. CT ABDOMEN PELVIS FINDINGS Hepatobiliary: Normal liver. Normal gallbladder, without biliary ductal dilatation. Pancreas: Fatty replacement involving the pancreatic head and uncinate process. No duct dilatation or acute inflammation. Spleen: Normal in size, without focal abnormality. Adrenals/Urinary Tract: Normal adrenal glands. Upper pole left renal 11 mm cyst. Normal right kidney. Normal urinary bladder. Stomach/Bowel: Normal stomach, without wall thickening. Normal colon, appendix, and terminal ileum. Normal small bowel. Vascular/Lymphatic: Mild ectasia of the celiac origin. Aortic atherosclerosis. Multiple left renal arteries. No abdominopelvic adenopathy. Reproductive: Normal prostate. Other: No significant free fluid. Small bilateral fat containing inguinal hernias. No evidence of omental or peritoneal disease. Musculoskeletal: No acute osseous abnormality. Degenerate disc disease at the lumbosacral junction. IMPRESSION: 1. Similar appearance of radiation induced consolidation and volume loss within the medial right middle and right lower lobes. No findings of recurrent or metastatic disease. 2. Similar moderate right-sided pleural effusion. 3. Coronary artery atherosclerosis. 4. Aortic Atherosclerosis (ICD10-I70.0). Electronically Signed   By: Kyle  Talbot M.D.   On: 07/21/2021 16:30  ° °CT Abdomen Pelvis W Contrast ° °Result Date: 07/21/2021 °CLINICAL DATA:  Non-small-cell lung cancer diagnosed in 2020. Radiation therapy complete. Immunotherapy complete. Chemotherapy in progress. Shortness of breath on exertion. EXAM: CT CHEST, ABDOMEN, AND PELVIS WITH CONTRAST TECHNIQUE:  Multidetector CT imaging of the chest, abdomen and pelvis was performed following the standard protocol during bolus administration of intravenous contrast. CONTRAST:  75mL OMNIPAQUE IOHEXOL 350 MG/ML SOLN COMPARISON:  03/28/2021 FINDINGS: CT CHEST FINDINGS Cardiovascular: Aortic atherosclerosis. Normal heart size, without pericardial effusion. Lad coronary artery calcification. No central pulmonary embolism, on this non-dedicated study. Mediastinum/Nodes: No supraclavicular adenopathy. No mediastinal or hilar adenopathy. Lungs/Pleura: A moderate right-sided pleural effusion is similar, without loculation or pleural thickening/nodularity. Medial right middle and right lower lobe radiation induced consolidation and volume loss are similar, without residual or recurrent disease. Musculoskeletal: No acute osseous abnormality. CT ABDOMEN PELVIS FINDINGS Hepatobiliary: Normal liver. Normal gallbladder, without biliary ductal dilatation. Pancreas: Fatty replacement involving the pancreatic head and uncinate process. No duct dilatation or acute inflammation. Spleen: Normal in size, without focal abnormality. Adrenals/Urinary Tract: Normal adrenal glands. Upper pole left renal 11 mm cyst. Normal right kidney. Normal urinary bladder. Stomach/Bowel: Normal stomach, without wall thickening. Normal colon, appendix, and terminal ileum. Normal small bowel. Vascular/Lymphatic: Mild ectasia of the celiac origin. Aortic atherosclerosis. Multiple left renal arteries. No abdominopelvic adenopathy. Reproductive: Normal prostate. Other: No significant free fluid. Small bilateral fat containing inguinal hernias. No evidence of omental or peritoneal disease. Musculoskeletal: No acute osseous abnormality. Degenerate disc disease at the lumbosacral junction. IMPRESSION: 1. Similar appearance of radiation induced   induced consolidation and volume loss within the medial right middle and right lower lobes. No findings of recurrent or metastatic  disease. 2. Similar moderate right-sided pleural effusion. 3. Coronary artery atherosclerosis. 4. Aortic Atherosclerosis (ICD10-I70.0). Electronically Signed   By: Abigail Miyamoto M.D.   On: 07/21/2021 16:30     ASSESSMENT AND PLAN: This is a very pleasant 60 years old white male with metastatic non-small cell lung cancer, adenocarcinoma now with ALK gene translocation.  The patient was initially diagnosed as a stage IIIa non-small cell lung cancer status post a course of concurrent chemoradiation with weekly carboplatin and paclitaxel followed by 1 year of consolidation treatment with immunotherapy with Imfinzi. He had evidence for disease progression recently. Repeat tissue biopsy from the left supraclavicular lymph nodes and molecular studies showed that the patient has positive ALK gene translocation.  His previous molecular studies by Guardant 360 was negative. I had a lengthy discussion with the patient today about his condition and treatment options. He received 1 cycle of systemic chemotherapy with carboplatin, Alimta and Keytruda.  We will discontinue his systemic chemotherapy for now because of the new findings on the molecular studies. The patient is currently on treatment with [name] 600 mg p.o. twice daily.  He is status post 17 months of treatment.   The patient has been tolerating this treatment well with no concerning adverse effects. He had repeat CT scan of the chest, abdomen pelvis performed recently.  I personally and independently reviewed the scan images and discussed the results with the patient today. His scan showed no concerning findings for disease progression and there was similar moderate right-sided pleural effusion. I recommended for the patient to continue his current treatment with [name] ([name]) with the same dose. Regarding the moderate right pleural effusion, I gave the patient the option of continuous observation and monitoring versus proceeding with  ultrasound-guided thoracentesis.  The patient mentions that he is starting a physical fitness program soon and he would like to have less fluid in the lung so we will proceed with the ultrasound-guided right thoracentesis hopefully next week. For the hypothyroidism, he will continue his current treatment with levothyroxine. For the history of pulmonary embolism we will continue his current treatment with Eliquis. The patient will come back for follow-up visit in 2 months for evaluation and repeat blood work. We may consider repeating MRI of the brain with the upcoming scan in 4 months to rule out any brain metastasis. The patient was advised to call immediately if he has any other concerning symptoms in the interval.  The patient voices understanding of current disease status and treatment options and is in agreement with the current care plan.  All questions were answered. The patient knows to call the clinic with any problems, questions or concerns. We can certainly see the patient much sooner if necessary.  Disclaimer: This note was dictated with voice recognition software. Similar sounding words can inadvertently be transcribed and may not be corrected upon review.

## 2021-08-01 ENCOUNTER — Telehealth: Payer: Self-pay | Admitting: Internal Medicine

## 2021-08-01 ENCOUNTER — Other Ambulatory Visit: Payer: Self-pay | Admitting: Radiation Therapy

## 2021-08-01 DIAGNOSIS — C7931 Secondary malignant neoplasm of brain: Secondary | ICD-10-CM

## 2021-08-01 NOTE — Telephone Encounter (Signed)
Sch per 12/21 los, pt aware

## 2021-08-14 ENCOUNTER — Other Ambulatory Visit: Payer: Self-pay

## 2021-08-14 ENCOUNTER — Ambulatory Visit (HOSPITAL_COMMUNITY)
Admission: RE | Admit: 2021-08-14 | Discharge: 2021-08-14 | Disposition: A | Discharge status: Home / Self Care | Payer: BC Managed Care – PPO | Source: Ambulatory Visit | Attending: Physician Assistant | Admitting: Physician Assistant

## 2021-08-14 ENCOUNTER — Ambulatory Visit (HOSPITAL_COMMUNITY)
Admission: RE | Admit: 2021-08-14 | Discharge: 2021-08-14 | Disposition: A | Discharge status: Home / Self Care | Payer: BC Managed Care – PPO | Source: Ambulatory Visit | Attending: Internal Medicine | Admitting: Internal Medicine

## 2021-08-14 DIAGNOSIS — C3491 Malignant neoplasm of unspecified part of right bronchus or lung: Secondary | ICD-10-CM | POA: Insufficient documentation

## 2021-08-14 DIAGNOSIS — J9811 Atelectasis: Secondary | ICD-10-CM | POA: Diagnosis not present

## 2021-08-14 DIAGNOSIS — J90 Pleural effusion, not elsewhere classified: Secondary | ICD-10-CM | POA: Insufficient documentation

## 2021-08-14 DIAGNOSIS — Z85118 Personal history of other malignant neoplasm of bronchus and lung: Secondary | ICD-10-CM | POA: Diagnosis not present

## 2021-08-14 MED ORDER — LIDOCAINE HCL 1 % IJ SOLN
INTRAMUSCULAR | Status: AC
  Administered 2021-08-14: 10 mL
  Filled 2021-08-14: qty 20

## 2021-08-14 NOTE — Procedures (Signed)
PROCEDURE SUMMARY:  Successful image-guided right thoracentesis. Yielded 1.5 liters of clear yellow fluid. Patient tolerated procedure well. EBL < 1 mL No immediate complications.  Specimen was not sent for labs. Post procedure CXR shows no pneumothorax.  Please see imaging section of Epic for full dictation.  Joaquim Nam PA-C 08/14/2021 9:57 AM

## 2021-08-20 ENCOUNTER — Encounter: Payer: Self-pay | Admitting: Internal Medicine

## 2021-08-21 ENCOUNTER — Other Ambulatory Visit: Payer: Self-pay

## 2021-08-21 DIAGNOSIS — C7931 Secondary malignant neoplasm of brain: Secondary | ICD-10-CM

## 2021-08-22 ENCOUNTER — Other Ambulatory Visit: Payer: Self-pay

## 2021-09-08 DIAGNOSIS — R5381 Other malaise: Secondary | ICD-10-CM | POA: Diagnosis not present

## 2021-09-08 DIAGNOSIS — U071 COVID-19: Secondary | ICD-10-CM | POA: Diagnosis not present

## 2021-09-08 DIAGNOSIS — J029 Acute pharyngitis, unspecified: Secondary | ICD-10-CM | POA: Diagnosis not present

## 2021-09-08 DIAGNOSIS — R509 Fever, unspecified: Secondary | ICD-10-CM | POA: Diagnosis not present

## 2021-09-08 DIAGNOSIS — R0981 Nasal congestion: Secondary | ICD-10-CM | POA: Diagnosis not present

## 2021-09-24 ENCOUNTER — Inpatient Hospital Stay (HOSPITAL_BASED_OUTPATIENT_CLINIC_OR_DEPARTMENT_OTHER): Payer: BC Managed Care – PPO | Admitting: Internal Medicine

## 2021-09-24 ENCOUNTER — Other Ambulatory Visit: Payer: Self-pay

## 2021-09-24 ENCOUNTER — Inpatient Hospital Stay: Payer: BC Managed Care – PPO | Attending: Internal Medicine

## 2021-09-24 VITALS — BP 119/72 | HR 111 | Temp 97.2°F | Resp 18 | Ht 68.0 in | Wt 191.6 lb

## 2021-09-24 DIAGNOSIS — C3490 Malignant neoplasm of unspecified part of unspecified bronchus or lung: Secondary | ICD-10-CM | POA: Diagnosis not present

## 2021-09-24 DIAGNOSIS — Z79899 Other long term (current) drug therapy: Secondary | ICD-10-CM | POA: Diagnosis not present

## 2021-09-24 DIAGNOSIS — E039 Hypothyroidism, unspecified: Secondary | ICD-10-CM | POA: Diagnosis not present

## 2021-09-24 DIAGNOSIS — Z923 Personal history of irradiation: Secondary | ICD-10-CM | POA: Insufficient documentation

## 2021-09-24 DIAGNOSIS — Z7901 Long term (current) use of anticoagulants: Secondary | ICD-10-CM | POA: Diagnosis not present

## 2021-09-24 DIAGNOSIS — C770 Secondary and unspecified malignant neoplasm of lymph nodes of head, face and neck: Secondary | ICD-10-CM | POA: Diagnosis not present

## 2021-09-24 DIAGNOSIS — C3411 Malignant neoplasm of upper lobe, right bronchus or lung: Secondary | ICD-10-CM | POA: Insufficient documentation

## 2021-09-24 DIAGNOSIS — Z9221 Personal history of antineoplastic chemotherapy: Secondary | ICD-10-CM | POA: Diagnosis not present

## 2021-09-24 DIAGNOSIS — Z86711 Personal history of pulmonary embolism: Secondary | ICD-10-CM | POA: Insufficient documentation

## 2021-09-24 DIAGNOSIS — C3491 Malignant neoplasm of unspecified part of right bronchus or lung: Secondary | ICD-10-CM

## 2021-09-24 LAB — CBC WITH DIFFERENTIAL (CANCER CENTER ONLY)
Abs Immature Granulocytes: 0.1 10*3/uL — ABNORMAL HIGH (ref 0.00–0.07)
Basophils Absolute: 0.1 10*3/uL (ref 0.0–0.1)
Basophils Relative: 1 %
Eosinophils Absolute: 0.2 10*3/uL (ref 0.0–0.5)
Eosinophils Relative: 3 %
HCT: 35.5 % — ABNORMAL LOW (ref 39.0–52.0)
Hemoglobin: 12 g/dL — ABNORMAL LOW (ref 13.0–17.0)
Immature Granulocytes: 1 %
Lymphocytes Relative: 14 %
Lymphs Abs: 1.4 10*3/uL (ref 0.7–4.0)
MCH: 31.5 pg (ref 26.0–34.0)
MCHC: 33.8 g/dL (ref 30.0–36.0)
MCV: 93.2 fL (ref 80.0–100.0)
Monocytes Absolute: 1.4 10*3/uL — ABNORMAL HIGH (ref 0.1–1.0)
Monocytes Relative: 15 %
Neutro Abs: 6.5 10*3/uL (ref 1.7–7.7)
Neutrophils Relative %: 66 %
Platelet Count: 297 10*3/uL (ref 150–400)
RBC: 3.81 MIL/uL — ABNORMAL LOW (ref 4.22–5.81)
RDW: 16.8 % — ABNORMAL HIGH (ref 11.5–15.5)
WBC Count: 9.8 10*3/uL (ref 4.0–10.5)
nRBC: 0 % (ref 0.0–0.2)

## 2021-09-24 LAB — CMP (CANCER CENTER ONLY)
ALT: 36 U/L (ref 0–44)
AST: 30 U/L (ref 15–41)
Albumin: 4 g/dL (ref 3.5–5.0)
Alkaline Phosphatase: 126 U/L (ref 38–126)
Anion gap: 6 (ref 5–15)
BUN: 14 mg/dL (ref 6–20)
CO2: 25 mmol/L (ref 22–32)
Calcium: 8.7 mg/dL — ABNORMAL LOW (ref 8.9–10.3)
Chloride: 106 mmol/L (ref 98–111)
Creatinine: 0.84 mg/dL (ref 0.61–1.24)
GFR, Estimated: >60 mL/min (ref >60)
Glucose, Bld: 89 mg/dL (ref 70–99)
Potassium: 4.2 mmol/L (ref 3.5–5.1)
Sodium: 137 mmol/L (ref 135–145)
Total Bilirubin: 0.7 mg/dL (ref 0.3–1.2)
Total Protein: 6.5 g/dL (ref 6.5–8.1)

## 2021-09-24 NOTE — Progress Notes (Signed)
[facility] [phone]) [phone]   Fax:(336) [phone]  OFFICE PROGRESS NOTE  [name], MD [place] [address]  DIAGNOSIS:  Metastatic non-small cell lung cancer initially diagnosed as stage IIIB (T1c, N3, M0) non-small cell lung cancer, adenocarcinoma presented with right upper lobe lung mass in addition to mediastinal and bilateral supraclavicular lymphadenopathy diagnosed in January 2020.  He had evidence of disease recurrence in June 2021 with adenopathy in the subcarinal, level 3 left neck lymph node, and lymph node at the curious of the diaphragm and in the upper abdomen.   PD-L1: 10%   Guardant 360 molecular studies showed no actionable mutation   Foundation One Testing:   Biomarker Findings Microsatellite status - MS-Stable Tumor Mutational Burden - 4 Muts/Mb Genomic Findings For a complete list of the genes assayed, please refer to the Appendix. ALK EML4-ALK fusion (Variant 2) SMARCB1 R377C CTNNB1 S45P CDKN2A/B CDKN2B loss, CDKN2A loss CXCR4 R945* OP92 splice site 924M>Q 7 Disease relevant genes with no reportable alterations: BRAF, EGFR, ERBB2, KRAS, MET, RET, ROS1  PRIOR THERAPY: 1) Concurrent chemoradiation with weekly carboplatin for AUC of 2 and paclitaxel 45 mg/M2.  Status post 6 cycles.  Last dose was given on October 10, 2018 with stable disease. 2) Radiation treatment to the enlarging right cervical lymph nodes under the care of Dr. Sondra Come. First treatment 03/06/2019. Last treatment scheduled on 04/13/2019 3) Consolidation treatment with immunotherapy with Imfinzi 10 mg/KG every 2 weeks.  First dose November 17, 2018.  Status post 26 cycles. 4) Systemic chemotherapy with carboplatin for an AUC of 5, Alimta 500 mg/m2, and Keytruda 200 mg IV every 3 weeks. First dose expected on 02/14/20. Status post 1 cycle.  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 5) SBRT to the left lower lobe lung nodule.   CURRENT THERAPY:  [name] ([name]) 600 mg p.o. twice daily.  He started the first dose on March 08, 2020.   Status post 19 months of treatment.  INTERVAL HISTORY: Brian [name] 61 y.o. male returns to the clinic today for follow-up visit.  The patient is feeling fine today with no concerning complaints.  He denied having any current chest pain, shortness of breath, cough or hemoptysis.  He denied having any fever or chills.  He has no nausea, vomiting, diarrhea or constipation.  He has no headache or visual changes.  He denied having any recent weight loss or night sweats.  He continues to tolerate his treatment with [name] fairly well.  He is here today for evaluation and repeat blood work.  MEDICAL HISTORY: Past Medical History:  Diagnosis Date   COPD (chronic obstructive pulmonary disease) ([facility])    Diabetes mellitus without complication ([facility])    Hypertension    nscl ca dx'd 08/2018   Pneumonia    Subarachnoid hemorrhage ([facility])     ALLERGIES:  has No Known Allergies.  MEDICATIONS:  Current Outpatient Medications  Medication Sig Dispense Refill   albuterol (VENTOLIN HFA) 108 (90 Base) MCG/ACT inhaler INHALE 2 PUFFS INTO THE LUNGS EVERY 4 (FOUR) HOURS AS NEEDED FOR WHEEZING OR SHORTNESS OF BREATH. 6.7 each 4   [name] 150 MG capsule TAKE 4 CAPSULES BY MOUTH TWICE DAILY WITH FOOD. SWALLOW CAPSULES WHOLE. DO NOT OPEN OR CRUSH. STORE IN ORIGINAL CONTAINER 240 capsule 2   amLODipine (NORVASC) 2.5 MG tablet Take 2.5 mg  by mouth daily.     B Complex Vitamins (B COMPLEX PO) Take 1 tablet by mouth daily.     ELIQUIS 5 MG TABS tablet TAKE 1 TABLET BY MOUTH TWICE A DAY 60 tablet 2   levothyroxine (SYNTHROID) 100 MCG tablet TAKE 1 TABLET BY MOUTH DAILY BEFORE BREAKFAST. 90 tablet 1   Multiple Vitamin (MULTIVITAMIN) tablet Take 1 tablet by mouth daily.     TRELEGY ELLIPTA 100-62.5-25 MCG/INH AEPB Inhale 1 puff into the  lungs daily.     venlafaxine (EFFEXOR) 37.5 MG tablet Take 37.5 mg by mouth daily.      No current facility-administered medications for this visit.    SURGICAL HISTORY:  Past Surgical History:  Procedure Laterality Date   INGUINAL HERNIA REPAIR Right 06/24/2020   Procedure: RIGHT OPEN INGUINAL HERNIA REPAIR WITH MESH;  Surgeon: Kinsinger, Arta Bruce, MD;  Location: [facility];  Service: General;  Laterality: Right;   IR IVC FILTER PLMT / S&I Burke Keels GUID/MOD SED  03/04/2020   IR IVC FILTER RETRIEVAL / S&I Burke Keels GUID/MOD SED  08/15/2020   IR RADIOLOGIST EVAL & MGMT  08/06/2020   LEG SURGERY  age 55    left leg, from fracture    REVIEW OF SYSTEMS:  A comprehensive review of systems was negative.   PHYSICAL EXAMINATION: General appearance: alert, cooperative, and no distress Head: Normocephalic, without obvious abnormality, atraumatic Neck: no adenopathy, no JVD, supple, symmetrical, trachea midline, and thyroid not enlarged, symmetric, no tenderness/mass/nodules Lymph nodes: Cervical, supraclavicular, and axillary nodes normal. Resp: clear to auscultation bilaterally Back: symmetric, no curvature. ROM normal. No CVA tenderness. Cardio: regular rate and rhythm, S1, S2 normal, no murmur, click, rub or gallop GI: soft, non-tender; bowel sounds normal; no masses,  no organomegaly Extremities: extremities normal, atraumatic, no cyanosis or edema  ECOG PERFORMANCE STATUS: 1 - Symptomatic but completely ambulatory  Blood pressure 119/72, pulse (!) 111, temperature (!) 97.2 F (36.2 C), temperature source Tympanic, resp. rate 18, height '5\' 8"'  (1.727 m), weight 191 lb 9.6 oz (86.9 kg), SpO2 97 %.  LABORATORY DATA: Lab Results  Component Value Date   WBC 9.8 09/24/2021   HGB 12.0 (L) 09/24/2021   HCT 35.5 (L) 09/24/2021   MCV 93.2 09/24/2021   PLT 297 09/24/2021      Chemistry      Component Value Date/Time   NA 140 07/21/2021 0913   K 3.8 07/21/2021 0913   CL 109 07/21/2021 0913   CO2 23  07/21/2021 0913   BUN 13 07/21/2021 0913   CREATININE 0.92 07/21/2021 0913      Component Value Date/Time   CALCIUM 8.6 (L) 07/21/2021 0913   ALKPHOS 148 (H) 07/21/2021 0913   AST 31 07/21/2021 0913   ALT 38 07/21/2021 0913   BILITOT 0.9 07/21/2021 0913       RADIOGRAPHIC STUDIES: No results found.   ASSESSMENT AND PLAN: This is a very pleasant 61 years old white male with metastatic non-small cell lung cancer, adenocarcinoma now with ALK gene translocation.  The patient was initially diagnosed as a stage IIIa non-small cell lung cancer status post a course of concurrent chemoradiation with weekly carboplatin and paclitaxel followed by 1 year of consolidation treatment with immunotherapy with Imfinzi. He had evidence for disease progression recently. Repeat tissue biopsy from the left supraclavicular lymph nodes and molecular studies showed that the patient has positive ALK gene translocation.  His previous molecular studies by Guardant 360 was negative. I had a lengthy discussion with  the patient today about his condition and treatment options. He received 1 cycle of systemic chemotherapy with carboplatin, Alimta and Keytruda.  We will discontinue his systemic chemotherapy for now because of the new findings on the molecular studies. The patient is currently on treatment with [name] 600 mg p.o. twice daily.  He is status post 19 months of treatment.   The patient has been tolerating his treatment with [name] fairly well with no concerning adverse effects. I recommended for him to continue his treatment with the same dose. For the hypothyroidism, he will continue his current treatment with levothyroxine. For the history of pulmonary embolism we will continue his current treatment with Eliquis. I will see him back for follow-up visit in 2 months for evaluation with repeat CT scan of the chest, abdomen and pelvis for restaging of his disease. The patient is scheduled to have MRI of the  brain next months. He was advised to call immediately if he has any other concerning symptoms in the interval. The patient voices understanding of current disease status and treatment options and is in agreement with the current care plan.  All questions were answered. The patient knows to call the clinic with any problems, questions or concerns. We can certainly see the patient much sooner if necessary.  Disclaimer: This note was dictated with voice recognition software. Similar sounding words can inadvertently be transcribed and may not be corrected upon review.

## 2021-09-25 ENCOUNTER — Telehealth: Payer: Self-pay | Admitting: Internal Medicine

## 2021-09-25 NOTE — Telephone Encounter (Signed)
Scheduled per 2/22 los, message has been left with pt

## 2021-09-28 ENCOUNTER — Other Ambulatory Visit: Payer: Self-pay | Admitting: Physician Assistant

## 2021-09-28 DIAGNOSIS — I824Y3 Acute embolism and thrombosis of unspecified deep veins of proximal lower extremity, bilateral: Secondary | ICD-10-CM

## 2021-09-29 ENCOUNTER — Encounter: Payer: Self-pay | Admitting: Internal Medicine

## 2021-10-09 ENCOUNTER — Ambulatory Visit (HOSPITAL_COMMUNITY)
Admission: RE | Admit: 2021-10-09 | Discharge: 2021-10-09 | Disposition: A | Discharge status: Home / Self Care | Payer: BC Managed Care – PPO | Source: Ambulatory Visit | Attending: Radiation Oncology | Admitting: Radiation Oncology

## 2021-10-09 ENCOUNTER — Other Ambulatory Visit: Payer: Self-pay

## 2021-10-09 DIAGNOSIS — C7931 Secondary malignant neoplasm of brain: Secondary | ICD-10-CM | POA: Diagnosis not present

## 2021-10-09 MED ORDER — GADOBUTROL 1 MMOL/ML IV SOLN
9.5000 mL | Freq: Once | INTRAVENOUS | Status: AC | PRN
  Administered 2021-10-09: 13:00:00 9.5 mL via INTRAVENOUS

## 2021-10-09 MED ORDER — GADOBUTROL 1 MMOL/ML IV SOLN: 8.5000 mL | Freq: Once | INTRAVENOUS | Status: DC | PRN

## 2021-10-13 ENCOUNTER — Ambulatory Visit
Admission: RE | Admit: 2021-10-13 | Discharge: 2021-10-13 | Disposition: A | Discharge status: Home / Self Care | Payer: BC Managed Care – PPO | Source: Ambulatory Visit | Attending: Radiation Oncology | Admitting: Radiation Oncology

## 2021-10-13 ENCOUNTER — Inpatient Hospital Stay: Payer: BC Managed Care – PPO | Attending: Internal Medicine

## 2021-10-13 ENCOUNTER — Encounter: Payer: Self-pay | Admitting: Radiation Oncology

## 2021-10-13 DIAGNOSIS — C3491 Malignant neoplasm of unspecified part of right bronchus or lung: Secondary | ICD-10-CM

## 2021-10-13 DIAGNOSIS — C7931 Secondary malignant neoplasm of brain: Secondary | ICD-10-CM

## 2021-10-13 NOTE — Progress Notes (Addendum)
Spoke w/ patient, verified identity, and began nursing interview. Patient is doing well. Denies, any pain or shortness of breath. No issues reported at this time. ? ?Meaningful use complete. ? ?Patient reminded of his 1:00pm-10/13/21 telephone appointment w/ Shona Simpson PA-C. I left my extension [phone] in case patient needs anything. Patient verbalized understanding of information. ? ?Patient contact [phone] ? ?

## 2021-10-13 NOTE — Progress Notes (Signed)
Radiation Oncology         [phone]) [phone] ________________________________  Outpatient Follow Up - Conducted via telephone due to current COVID-19 concerns for limiting patient exposure  I spoke with the patient to conduct this consult visit via telephone to spare the patient unnecessary potential exposure in the healthcare setting during the current COVID-19 pandemic. The patient was notified in advance and was offered a [place] meeting to allow for face to face communication but unfortunately reported that they did not have the appropriate resources/technology to support such a visit and instead preferred to proceed with a telephone visit.    Name: Brian [name]        MRN: [id number]  Date of Service: 10/13/2021 DOB: 17-Aug-1960  AT:[id number], [name], MD  [name], MD     REFERRING PHYSICIAN: [name], MD   DIAGNOSIS: There were no encounter diagnoses.   HISTORY OF PRESENT ILLNESS: Brian [name] is a 61 y.o. male with a history of Stage IIIB, cT1cN3M0, NSCLC, adenocarcinoma in January 2020.  He was treated with chemoradiation under the care of Dr. Julien Nordmann and Dr. Sondra Come.  He was found to have progressive disease in the summer 2020 in the right cervical lymph node chain and was treated with definitive radiotherapy.  He received consolidative immunotherapy.  He was found to have recurrent disease in the left cervical lymph nodes, and biopsy on 02/16/2020 confirmed the diagnosis.  He was ready to proceed with systemic chemotherapy, and an MRI brain on 02/21/20 for restaging revealed a 5 mm right lateral temporal lobe metastasis. A 3T MRI scan of the brain with and without contrast on 03/01/2020 showed the 5 mm metastasis in the right lateral temporal lobe, a 2 mm metastasis in the left posterior frontal cortex, and a 3 mm metastasis along the undersurface of the right thalamus.  Small vessel ischemic changes were also noted within the hemispheric white matter and were stable. He  met with our team and Dr. Lisbeth Renshaw offered [facility] treatment to these three lesions. The patient was prepared to proceed, but couldn't tolerate the mask for simulation and declined to proceed. We reviewed the rationale to repeat imaging, and this was performed with MRI brain on 05/02/20 which showed no visible lesions of concern that had been seen prior. A repeat scan 6 months later which was performed on 10/07/20 again did not show evidence of disease. 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   He had another MRI on 10/09/21 that did not show any disease in the brain. He did have right maxillary air fluid level suspicious for sinusitis. He's contacted by phone today to review these results.  PREVIOUS RADIATION THERAPY: Yes  03/06/2019-04/13/2019: The right cervical node chain was treated to 60 Gy in 30 fractions  09/05/2018-10/14/2018:   The right lung target was treated to 60 Gy in 30 fractions.   PAST MEDICAL HISTORY:  Past Medical History:  Diagnosis Date   COPD (chronic obstructive pulmonary disease) ([place])    Diabetes mellitus without complication ([place])    Hypertension    nscl ca dx'd 08/2018   Pneumonia    Subarachnoid hemorrhage ([place])        PAST SURGICAL HISTORY: Past Surgical History:  Procedure Laterality Date   INGUINAL HERNIA REPAIR Right 06/24/2020   Procedure: RIGHT OPEN INGUINAL HERNIA REPAIR WITH MESH;  Surgeon: Kinsinger, Arta Bruce, MD;  Location: [facility];  Service: General;  Laterality: Right;   IR IVC FILTER PLMT / S&I Burke Keels  GUID/MOD SED  03/04/2020   IR IVC FILTER RETRIEVAL / S&I /IMG GUID/MOD SED  08/15/2020   IR RADIOLOGIST EVAL & MGMT  08/06/2020   LEG SURGERY  age 30    left leg, from fracture     FAMILY HISTORY:  Family History  Problem Relation Age of Onset   Colon cancer Neg Hx    Stomach cancer Neg Hx    Thyroid cancer Neg Hx      SOCIAL HISTORY:  reports that he has never smoked. He has never used smokeless tobacco. He reports current alcohol use of about  5.0 standard drinks per week. He reports that he does not use drugs.  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   ALLERGIES: Patient has no known allergies.   MEDICATIONS:  Current Outpatient Medications  Medication Sig Dispense Refill   albuterol (VENTOLIN HFA) 108 (90 Base) MCG/ACT inhaler INHALE 2 PUFFS INTO THE LUNGS EVERY 4 (FOUR) HOURS AS NEEDED FOR WHEEZING OR SHORTNESS OF BREATH. 6.7 each 4   [name] 150 MG capsule TAKE 4 CAPSULES BY MOUTH TWICE DAILY WITH FOOD. SWALLOW CAPSULES WHOLE. DO NOT OPEN OR CRUSH. STORE IN ORIGINAL CONTAINER 240 capsule 2   amLODipine (NORVASC) 2.5 MG tablet Take 2.5 mg by mouth daily.     B Complex Vitamins (B COMPLEX PO) Take 1 tablet by mouth daily.     ELIQUIS 5 MG TABS tablet TAKE 1 TABLET BY MOUTH TWICE A DAY 60 tablet 2   levothyroxine (SYNTHROID) 100 MCG tablet TAKE 1 TABLET BY MOUTH DAILY BEFORE BREAKFAST. 90 tablet 1   Multiple Vitamin (MULTIVITAMIN) tablet Take 1 tablet by mouth daily.     TRELEGY ELLIPTA 100-62.5-25 MCG/INH AEPB Inhale 1 puff into the lungs daily.     venlafaxine (EFFEXOR) 37.5 MG tablet Take 37.5 mg by mouth daily.      No current facility-administered medications for this encounter.     REVIEW OF SYSTEMS: The patient was unavailable when I called.    PHYSICAL EXAM:  Unable to assess due to encounter type.  ECOG = unable to assess.  0 - Asymptomatic (Fully active, able to carry on all predisease activities without restriction)  1 - Symptomatic but completely ambulatory (Restricted in physically strenuous activity but ambulatory and able to carry out work of a light or sedentary nature. For example, light housework, office work)  2 - Symptomatic, <50% in bed during the day (Ambulatory and capable of all self care but unable to carry out any work activities. Up and about more than 50% of waking hours)  3 -  Symptomatic, >50% in bed, but not bedbound (Capable of only limited self-care, confined to bed or chair 50% or more of waking hours)  4 - Bedbound (Completely disabled. Cannot carry on any self-care. Totally confined to bed or chair)  5 - Death   Eustace Pen MM, Creech RH, Tormey DC, et al. [phone]). "Toxicity and response criteria of the [facility]". [place] Oncol. 5 (6): 649-55    LABORATORY DATA:  Lab Results  Component Value Date   WBC 9.8 09/24/2021   HGB 12.0 (L) 09/24/2021   HCT 35.5 (L) 09/24/2021   MCV 93.2 09/24/2021   PLT 297 09/24/2021   Lab Results  Component Value Date   NA 137 09/24/2021   K 4.2 09/24/2021   CL 106 09/24/2021  CO2 25 09/24/2021   Lab Results  Component Value Date   ALT 36 09/24/2021   AST 30 09/24/2021   ALKPHOS 126 09/24/2021   BILITOT 0.7 09/24/2021      RADIOGRAPHY: MR Brain W Wo Contrast  Result Date: 10/09/2021 CLINICAL DATA:  Brain metastases, assess treatment response EXAM: MRI HEAD WITHOUT AND WITH CONTRAST TECHNIQUE: Multiplanar, multiecho pulse sequences of the brain and surrounding structures were obtained without and with intravenous contrast. CONTRAST:  9.42m GADAVIST GADOBUTROL 1 MMOL/ML IV SOLN COMPARISON:  10/10/2020 FINDINGS: Brain: No restricted diffusion to suggest acute or subacute infarct. No acute hemorrhage, mass, mass effect, or midline shift. No abnormal parenchymal or meningeal enhancement. No hydrocephalus or extra-axial collection. Scattered T2 hyperintense signal in the periventricular white matter, likely the sequela of chronic small vessel ischemic disease. Vascular: Normal flow voids. Skull and upper cervical spine: Normal marrow signal. Sinuses/Orbits: Mucosal thickening in the maxillary sinuses, ethmoid air cells, and inferior frontal sinuses, with air-fluid level in the right maxillary sinus. The orbits are unremarkable. Other: None. IMPRESSION: 1. No evidence of metastatic disease in the  brain. 2. Air-fluid level in the right maxillary sinus, which can be seen in the setting of acute sinusitis. Correlate with symptoms. Electronically Signed   By: AMerilyn BabaM.D.   On: 10/09/2021 14:50        IMPRESSION/PLAN: 1. Progressive metastatic non-small cell lung cancer, adenocarcinoma originally in the right lung with prior concern for brain metastases.  I was unable to reach the patient but left a voicemail and on the message, I discussed that we would be happy to continue to follow up with another repeat MRI in 1 year as he continues to follow with Dr. MJulien Nordmann   2. Radiation recall rash. This cannot be assessed today but we will follow this expectantly. 3. Claustrophobia. The patient will take  Ativan  prior to  MRI scans and notify u[place]if he needs a new prescription prior to next year's scan.   As above, I was unable to reach the patient by phone and left a voicemail.     ACarola Rhine PAC

## 2021-10-20 DIAGNOSIS — M7989 Other specified soft tissue disorders: Secondary | ICD-10-CM | POA: Diagnosis not present

## 2021-10-20 DIAGNOSIS — R202 Paresthesia of skin: Secondary | ICD-10-CM | POA: Diagnosis not present

## 2021-10-20 DIAGNOSIS — E1165 Type 2 diabetes mellitus with hyperglycemia: Secondary | ICD-10-CM | POA: Diagnosis not present

## 2021-10-20 DIAGNOSIS — R200 Anesthesia of skin: Secondary | ICD-10-CM | POA: Diagnosis not present

## 2021-11-07 ENCOUNTER — Other Ambulatory Visit: Payer: Self-pay | Admitting: Internal Medicine

## 2021-11-07 DIAGNOSIS — C3491 Malignant neoplasm of unspecified part of right bronchus or lung: Secondary | ICD-10-CM

## 2021-11-13 ENCOUNTER — Inpatient Hospital Stay: Payer: BC Managed Care – PPO | Attending: Internal Medicine

## 2021-11-13 ENCOUNTER — Ambulatory Visit (HOSPITAL_COMMUNITY)
Admission: RE | Admit: 2021-11-13 | Discharge: 2021-11-13 | Disposition: A | Discharge status: Home / Self Care | Payer: BC Managed Care – PPO | Source: Ambulatory Visit | Attending: Internal Medicine | Admitting: Internal Medicine

## 2021-11-13 ENCOUNTER — Other Ambulatory Visit: Payer: Self-pay

## 2021-11-13 DIAGNOSIS — C770 Secondary and unspecified malignant neoplasm of lymph nodes of head, face and neck: Secondary | ICD-10-CM | POA: Insufficient documentation

## 2021-11-13 DIAGNOSIS — Z9221 Personal history of antineoplastic chemotherapy: Secondary | ICD-10-CM | POA: Insufficient documentation

## 2021-11-13 DIAGNOSIS — N281 Cyst of kidney, acquired: Secondary | ICD-10-CM | POA: Diagnosis not present

## 2021-11-13 DIAGNOSIS — J90 Pleural effusion, not elsewhere classified: Secondary | ICD-10-CM | POA: Diagnosis not present

## 2021-11-13 DIAGNOSIS — C3490 Malignant neoplasm of unspecified part of unspecified bronchus or lung: Secondary | ICD-10-CM

## 2021-11-13 DIAGNOSIS — Z7901 Long term (current) use of anticoagulants: Secondary | ICD-10-CM | POA: Insufficient documentation

## 2021-11-13 DIAGNOSIS — I700 Atherosclerosis of aorta: Secondary | ICD-10-CM | POA: Diagnosis not present

## 2021-11-13 DIAGNOSIS — C3411 Malignant neoplasm of upper lobe, right bronchus or lung: Secondary | ICD-10-CM | POA: Insufficient documentation

## 2021-11-13 DIAGNOSIS — Z79890 Hormone replacement therapy (postmenopausal): Secondary | ICD-10-CM | POA: Insufficient documentation

## 2021-11-13 DIAGNOSIS — E119 Type 2 diabetes mellitus without complications: Secondary | ICD-10-CM | POA: Insufficient documentation

## 2021-11-13 DIAGNOSIS — I10 Essential (primary) hypertension: Secondary | ICD-10-CM | POA: Insufficient documentation

## 2021-11-13 DIAGNOSIS — Z79899 Other long term (current) drug therapy: Secondary | ICD-10-CM | POA: Insufficient documentation

## 2021-11-13 DIAGNOSIS — Z923 Personal history of irradiation: Secondary | ICD-10-CM | POA: Insufficient documentation

## 2021-11-13 DIAGNOSIS — Z86711 Personal history of pulmonary embolism: Secondary | ICD-10-CM | POA: Insufficient documentation

## 2021-11-13 DIAGNOSIS — K4090 Unilateral inguinal hernia, without obstruction or gangrene, not specified as recurrent: Secondary | ICD-10-CM | POA: Diagnosis not present

## 2021-11-13 DIAGNOSIS — D649 Anemia, unspecified: Secondary | ICD-10-CM | POA: Insufficient documentation

## 2021-11-13 DIAGNOSIS — E039 Hypothyroidism, unspecified: Secondary | ICD-10-CM | POA: Insufficient documentation

## 2021-11-13 LAB — CMP (CANCER CENTER ONLY)
ALT: 32 U/L (ref 0–44)
AST: 28 U/L (ref 15–41)
Albumin: 4 g/dL (ref 3.5–5.0)
Alkaline Phosphatase: 123 U/L (ref 38–126)
Anion gap: 6 (ref 5–15)
BUN: 15 mg/dL (ref 6–20)
CO2: 26 mmol/L (ref 22–32)
Calcium: 8.6 mg/dL — ABNORMAL LOW (ref 8.9–10.3)
Chloride: 106 mmol/L (ref 98–111)
Creatinine: 0.92 mg/dL (ref 0.61–1.24)
GFR, Estimated: >60 mL/min (ref >60)
Glucose, Bld: 133 mg/dL — ABNORMAL HIGH (ref 70–99)
Potassium: 3.6 mmol/L (ref 3.5–5.1)
Sodium: 138 mmol/L (ref 135–145)
Total Bilirubin: 0.6 mg/dL (ref 0.3–1.2)
Total Protein: 6.4 g/dL — ABNORMAL LOW (ref 6.5–8.1)

## 2021-11-13 LAB — CBC WITH DIFFERENTIAL (CANCER CENTER ONLY)
Abs Immature Granulocytes: 0.08 10*3/uL — ABNORMAL HIGH (ref 0.00–0.07)
Basophils Absolute: 0.1 10*3/uL (ref 0.0–0.1)
Basophils Relative: 1 %
Eosinophils Absolute: 0.3 10*3/uL (ref 0.0–0.5)
Eosinophils Relative: 3 %
HCT: 35.2 % — ABNORMAL LOW (ref 39.0–52.0)
Hemoglobin: 12 g/dL — ABNORMAL LOW (ref 13.0–17.0)
Immature Granulocytes: 1 %
Lymphocytes Relative: 14 %
Lymphs Abs: 1.4 10*3/uL (ref 0.7–4.0)
MCH: 32.2 pg (ref 26.0–34.0)
MCHC: 34.1 g/dL (ref 30.0–36.0)
MCV: 94.4 fL (ref 80.0–100.0)
Monocytes Absolute: 1.2 10*3/uL — ABNORMAL HIGH (ref 0.1–1.0)
Monocytes Relative: 12 %
Neutro Abs: 6.7 10*3/uL (ref 1.7–7.7)
Neutrophils Relative %: 69 %
Platelet Count: 258 10*3/uL (ref 150–400)
RBC: 3.73 MIL/uL — ABNORMAL LOW (ref 4.22–5.81)
RDW: 16.3 % — ABNORMAL HIGH (ref 11.5–15.5)
WBC Count: 9.8 10*3/uL (ref 4.0–10.5)
nRBC: 0 % (ref 0.0–0.2)

## 2021-11-13 MED ORDER — IOHEXOL 300 MG/ML  SOLN
100.0000 mL | Freq: Once | INTRAMUSCULAR | Status: AC | PRN
  Administered 2021-11-13: 100 mL via INTRAVENOUS

## 2021-11-19 DIAGNOSIS — H11823 Conjunctivochalasis, bilateral: Secondary | ICD-10-CM | POA: Diagnosis not present

## 2021-11-19 DIAGNOSIS — H5711 Ocular pain, right eye: Secondary | ICD-10-CM | POA: Diagnosis not present

## 2021-11-19 DIAGNOSIS — H04201 Unspecified epiphora, right lacrimal gland: Secondary | ICD-10-CM | POA: Diagnosis not present

## 2021-11-20 ENCOUNTER — Inpatient Hospital Stay (HOSPITAL_BASED_OUTPATIENT_CLINIC_OR_DEPARTMENT_OTHER): Payer: BC Managed Care – PPO | Admitting: Internal Medicine

## 2021-11-20 ENCOUNTER — Other Ambulatory Visit: Payer: Self-pay

## 2021-11-20 VITALS — BP 113/80 | HR 84 | Temp 98.1°F | Resp 17 | Ht 68.0 in | Wt 193.5 lb

## 2021-11-20 DIAGNOSIS — C770 Secondary and unspecified malignant neoplasm of lymph nodes of head, face and neck: Secondary | ICD-10-CM | POA: Diagnosis not present

## 2021-11-20 DIAGNOSIS — Z7901 Long term (current) use of anticoagulants: Secondary | ICD-10-CM | POA: Diagnosis not present

## 2021-11-20 DIAGNOSIS — D649 Anemia, unspecified: Secondary | ICD-10-CM | POA: Diagnosis not present

## 2021-11-20 DIAGNOSIS — I10 Essential (primary) hypertension: Secondary | ICD-10-CM | POA: Diagnosis not present

## 2021-11-20 DIAGNOSIS — Z86711 Personal history of pulmonary embolism: Secondary | ICD-10-CM | POA: Diagnosis not present

## 2021-11-20 DIAGNOSIS — Z79899 Other long term (current) drug therapy: Secondary | ICD-10-CM | POA: Diagnosis not present

## 2021-11-20 DIAGNOSIS — C3411 Malignant neoplasm of upper lobe, right bronchus or lung: Secondary | ICD-10-CM | POA: Diagnosis not present

## 2021-11-20 DIAGNOSIS — E039 Hypothyroidism, unspecified: Secondary | ICD-10-CM | POA: Diagnosis not present

## 2021-11-20 DIAGNOSIS — Z9221 Personal history of antineoplastic chemotherapy: Secondary | ICD-10-CM | POA: Diagnosis not present

## 2021-11-20 DIAGNOSIS — J90 Pleural effusion, not elsewhere classified: Secondary | ICD-10-CM | POA: Diagnosis not present

## 2021-11-20 DIAGNOSIS — E119 Type 2 diabetes mellitus without complications: Secondary | ICD-10-CM | POA: Diagnosis not present

## 2021-11-20 DIAGNOSIS — C3491 Malignant neoplasm of unspecified part of right bronchus or lung: Secondary | ICD-10-CM | POA: Diagnosis not present

## 2021-11-20 DIAGNOSIS — Z79890 Hormone replacement therapy (postmenopausal): Secondary | ICD-10-CM | POA: Diagnosis not present

## 2021-11-20 DIAGNOSIS — Z923 Personal history of irradiation: Secondary | ICD-10-CM | POA: Diagnosis not present

## 2021-11-20 NOTE — Progress Notes (Signed)
?    [place] ?Telephone:(336) [phone]   [phone] ? ?OFFICE PROGRESS NOTE ? ?[name], MD ?[address] [place] [address] ? ?DIAGNOSIS:  Metastatic non-small cell lung cancer initially diagnosed as stage IIIB (T1c, N3, M0) non-small cell lung cancer, adenocarcinoma presented with right upper lobe lung mass in addition to mediastinal and bilateral supraclavicular lymphadenopathy diagnosed in January 2020.  He had evidence of disease recurrence in June 2021 with adenopathy in the subcarinal, level 3 left neck lymph node, and lymph node at the curious of the diaphragm and in the upper abdomen. ?  ?PD-L1: 10% ?  ?Guardant 360 molecular studies showed no actionable mutation ?  ?Foundation One Testing:   ?Biomarker Findings ?Microsatellite status - MS-Stable ?Tumor Mutational Burden - 4 Muts/Mb ?Genomic Findings ?For a complete list of the genes assayed, please refer to the Appendix. ?ALK EML4-ALK fusion (Variant 2) ?SMARCB1 R377C ?CTNNB1 S45P ?CDKN2A/B CDKN2B loss, CDKN2A loss ?CXCR4 [id number]* ?SV77 splice site [id number] ?7 Disease relevant genes with no reportable alterations: BRAF, EGFR, ?ERBB2, KRAS, MET, RET, ROS1 ? ?PRIOR THERAPY: ?1) Concurrent chemoradiation with weekly carboplatin for AUC of 2 and paclitaxel 45 mg/M2.  Status post 6 cycles.  Last dose was given on October 10, 2018 with stable disease. ?2) Radiation treatment to the enlarging right cervical lymph nodes under the care of Dr. Sondra Come. First treatment 03/06/2019. Last treatment scheduled on 04/13/2019 ?3) Consolidation treatment with immunotherapy with Imfinzi 10 mg/KG every 2 weeks.  First dose November 17, 2018.  Status post 26 cycles. ?4) Systemic chemotherapy with carboplatin for an AUC of 5, Alimta 500 mg/m2, and Keytruda 200 mg IV every 3 weeks. First dose expected on 02/14/20. Status post 1 cycle.  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 ?5) SBRT to the left lower lobe lung nodule. ?  ?CURRENT THERAPY:  [name] ([name]) 600 mg p.o. twice daily.  He started the first dose on March 08, 2020.   Status post 21 months of treatment. ? ?INTERVAL HISTORY: ?Brian [name] 61 y.o. male returns to the clinic today for follow-up visit.  The patient is feeling fine today with no concerning complaints.  He denied having any current chest pain, shortness of breath, cough or hemoptysis.  He is very active and joined the gym again and practice 3 to 4 days a week.  He denied having any nausea, vomiting, diarrhea or constipation.  He has no headache or visual changes.  He has no recent weight loss or night sweats.  He has been tolerating his treatment with [name] fairly well.  He is here today for evaluation with repeat CT scan of the chest, abdomen and pelvis for restaging of his disease. ? ?MEDICAL HISTORY: ?Past Medical History:  ?Diagnosis Date  ? COPD (chronic obstructive pulmonary disease) ([place])   ? Diabetes mellitus without complication ([place])   ? Hypertension   ? nscl ca dx'd 08/2018  ? Pneumonia   ? Subarachnoid hemorrhage ([place])   ? ? ?ALLERGIES:  has No Known Allergies. ? ?MEDICATIONS:  ?Current Outpatient Medications  ?Medication Sig Dispense Refill  ? albuterol (VENTOLIN HFA) 108 (90 Base) MCG/ACT inhaler INHALE 2 PUFFS INTO THE LUNGS EVERY 4 (FOUR) HOURS AS NEEDED FOR WHEEZING OR SHORTNESS OF BREATH. 6.7 each 4  ? [name] 150 MG capsule TAKE 4 CAPSULES BY MOUTH TWICE DAILY WITH FOOD. SWALLOW CAPSULES WHOLE. DO  NOT OPEN OR CRUSH. STORE IN ORIGINAL CONTAINER 240 capsule 2  ? amLODipine (NORVASC) 2.5 MG tablet Take 2.5 mg by mouth daily.    ? B Complex Vitamins (B COMPLEX PO) Take 1 tablet by mouth daily.    ? ELIQUIS 5 MG TABS tablet TAKE 1 TABLET BY MOUTH TWICE A DAY 60 tablet 2  ? levothyroxine (SYNTHROID) 100 MCG tablet TAKE 1 TABLET BY MOUTH DAILY BEFORE BREAKFAST. 90 tablet 1  ? Multiple Vitamin (MULTIVITAMIN) tablet  Take 1 tablet by mouth daily.    ? TRELEGY ELLIPTA 100-62.5-25 MCG/INH AEPB Inhale 1 puff into the lungs daily.    ? venlafaxine (EFFEXOR) 37.5 MG tablet Take 37.5 mg by mouth daily.     ? ?No current facility-administered medications for this visit.  ? ? ?SURGICAL HISTORY:  ?Past Surgical History:  ?Procedure Laterality Date  ? INGUINAL HERNIA REPAIR Right 06/24/2020  ? Procedure: RIGHT OPEN INGUINAL HERNIA REPAIR WITH MESH;  Surgeon: Kinsinger, Arta Bruce, MD;  Location: [facility];  Service: General;  Laterality: Right;  ? IR IVC FILTER PLMT / S&I Burke Keels GUID/MOD SED  03/04/2020  ? IR IVC FILTER RETRIEVAL / S&I /IMG GUID/MOD SED  08/15/2020  ? IR RADIOLOGIST EVAL & MGMT  08/06/2020  ? LEG SURGERY  age 61   ? left leg, from fracture  ? ? ?REVIEW OF SYSTEMS:  Constitutional: negative ?Eyes: negative ?Ears, nose, mouth, throat, and face: negative ?Respiratory: negative ?Cardiovascular: negative ?Gastrointestinal: negative ?Genitourinary:negative ?Integument/breast: negative ?Hematologic/lymphatic: negative ?Musculoskeletal:negative ?Neurological: negative ?Behavioral/Psych: negative ?Endocrine: negative ?Allergic/Immunologic: negative  ? ?PHYSICAL EXAMINATION: General appearance: alert, cooperative, and no distress ?Head: Normocephalic, without obvious abnormality, atraumatic ?Neck: no adenopathy, no JVD, supple, symmetrical, trachea midline, and thyroid not enlarged, symmetric, no tenderness/mass/nodules ?Lymph nodes: Cervical, supraclavicular, and axillary nodes normal. ?Resp: diminished breath sounds RLL and dullness to percussion RLL ?Back: symmetric, no curvature. ROM normal. No CVA tenderness. ?Cardio: regular rate and rhythm, S1, S2 normal, no murmur, click, rub or gallop ?GI: soft, non-tender; bowel sounds normal; no masses,  no organomegaly ?Extremities: extremities normal, atraumatic, no cyanosis or edema ?Neurologic: Alert and oriented X 3, normal strength and tone. Normal symmetric reflexes. Normal coordination and  gait ? ?ECOG PERFORMANCE STATUS: 1 - Symptomatic but completely ambulatory ? ?Blood pressure 113/80, pulse 84, temperature 98.1 ?F (36.7 ?C), temperature source Tympanic, resp. rate 17, height '5\' 8"'  (1.727 m), weight 193 lb 8 oz (87.8 kg), SpO2 98 %. ? ?LABORATORY DATA: ?Lab Results  ?Component Value Date  ? WBC 9.8 11/13/2021  ? HGB 12.0 (L) 11/13/2021  ? HCT 35.2 (L) 11/13/2021  ? MCV 94.4 11/13/2021  ? PLT 258 11/13/2021  ? ? ?  Chemistry   ?   ?Component Value Date/Time  ? NA 138 11/13/2021 1205  ? K 3.6 11/13/2021 1205  ? CL 106 11/13/2021 1205  ? CO2 26 11/13/2021 1205  ? BUN 15 11/13/2021 1205  ? CREATININE 0.92 11/13/2021 1205  ?    ?Component Value Date/Time  ? CALCIUM 8.6 (L) 11/13/2021 1205  ? ALKPHOS 123 11/13/2021 1205  ? AST 28 11/13/2021 1205  ? ALT 32 11/13/2021 1205  ? BILITOT 0.6 11/13/2021 1205  ?  ? ? ? ?RADIOGRAPHIC STUDIES: ?CT Chest W Contrast ? ?Result Date: 11/13/2021 ?CLINICAL DATA:  History of non-small cell lung cancer diagnosed in 2020. Radiation therapy complete, immunotherapy complete. Chemotherapy in progress. * Tracking Code: BO * EXAM: CT CHEST, ABDOMEN, AND PELVIS WITH CONTRAST TECHNIQUE: Multidetector CT imaging of the  chest, abdomen and pelvis was performed following the standard protocol during bolus administration of intravenous contrast. RADIATION DOSE REDUCTION: This exam was performed according to the departmental dose-optimization program which includes automated exposure control, adjustment of the mA and/or kV according to patient size and/or use of iterative reconstruction technique. CONTRAST:  116m OMNIPAQUE IOHEXOL 300 MG/ML  SOLN COMPARISON:  Multiple priors including most recent CT July 21, 2021. FINDINGS: CT CHEST FINDINGS Cardiovascular: Scattered aortic atherosclerosis without thoracic aortic aneurysm. No central pulmonary embolus on this nondedicated study. Left anterior descending coronary artery calcifications. Normal size heart. No significant pericardial  effusion/thickening. Mediastinum/Nodes: No supraclavicular adenopathy. No discrete thyroid nodularity. No pathologically enlarged mediastinal, hilar or axillary lymph nodes. Lungs/Pleura: Moderate right pleura

## 2021-12-21 ENCOUNTER — Encounter: Payer: Self-pay | Admitting: Internal Medicine

## 2021-12-22 ENCOUNTER — Other Ambulatory Visit: Payer: Self-pay | Admitting: Internal Medicine

## 2021-12-22 DIAGNOSIS — C3491 Malignant neoplasm of unspecified part of right bronchus or lung: Secondary | ICD-10-CM

## 2021-12-27 ENCOUNTER — Other Ambulatory Visit: Payer: Self-pay | Admitting: Pulmonary Disease

## 2021-12-30 ENCOUNTER — Encounter: Payer: Self-pay | Admitting: Internal Medicine

## 2021-12-31 ENCOUNTER — Other Ambulatory Visit (HOSPITAL_COMMUNITY): Payer: Self-pay | Admitting: Internal Medicine

## 2021-12-31 ENCOUNTER — Ambulatory Visit (HOSPITAL_COMMUNITY)
Admission: RE | Admit: 2021-12-31 | Discharge: 2021-12-31 | Disposition: A | Discharge status: Home / Self Care | Payer: BC Managed Care – PPO | Source: Ambulatory Visit | Attending: Internal Medicine | Admitting: Internal Medicine

## 2021-12-31 ENCOUNTER — Encounter: Payer: Self-pay | Admitting: Internal Medicine

## 2021-12-31 DIAGNOSIS — R918 Other nonspecific abnormal finding of lung field: Secondary | ICD-10-CM | POA: Diagnosis not present

## 2021-12-31 DIAGNOSIS — C3491 Malignant neoplasm of unspecified part of right bronchus or lung: Secondary | ICD-10-CM | POA: Insufficient documentation

## 2021-12-31 DIAGNOSIS — J90 Pleural effusion, not elsewhere classified: Secondary | ICD-10-CM | POA: Diagnosis not present

## 2021-12-31 DIAGNOSIS — Z9889 Other specified postprocedural states: Secondary | ICD-10-CM

## 2021-12-31 DIAGNOSIS — J9811 Atelectasis: Secondary | ICD-10-CM | POA: Diagnosis not present

## 2021-12-31 MED ORDER — LIDOCAINE HCL 1 % IJ SOLN
INTRAMUSCULAR | Status: AC
  Administered 2021-12-31: 10 mL
  Filled 2021-12-31: qty 20

## 2021-12-31 NOTE — Procedures (Signed)
PROCEDURE SUMMARY:  Successful US guided therapeutic right thoracentesis. Yielded 1.3 L of clear, yellow fluid. Pt tolerated procedure well. No immediate complications.  Specimen not sent for labs. CXR ordered.  EBL < 1 mL  Tyson Alias, AGNP 12/31/2021 2:47 PM

## 2021-12-31 NOTE — Progress Notes (Signed)
Attempted to locate pt in radiology waiting after CXR resulted but was unable to locate patient. Called pt on cell to advise that his CXR post thoracentesis was normal. Pt verbalized understanding.    Narda Rutherford, AGNP-BC 12/31/2021, 3:43 PM

## 2022-01-01 ENCOUNTER — Other Ambulatory Visit: Payer: Self-pay | Admitting: Physician Assistant

## 2022-01-01 DIAGNOSIS — I824Y3 Acute embolism and thrombosis of unspecified deep veins of proximal lower extremity, bilateral: Secondary | ICD-10-CM

## 2022-01-09 ENCOUNTER — Telehealth: Payer: Self-pay | Admitting: Internal Medicine

## 2022-01-09 NOTE — Telephone Encounter (Signed)
Rescheduled 06/27 appointments per provider pal, patient has been called and voicemail was left.

## 2022-01-13 ENCOUNTER — Telehealth: Payer: Self-pay | Admitting: Internal Medicine

## 2022-01-13 NOTE — Telephone Encounter (Signed)
Called patient regarding upcoming scheduled appointment, left a voicemail.

## 2022-01-27 ENCOUNTER — Ambulatory Visit: Payer: BC Managed Care – PPO | Admitting: Internal Medicine

## 2022-01-27 ENCOUNTER — Other Ambulatory Visit: Payer: BC Managed Care – PPO

## 2022-01-28 ENCOUNTER — Inpatient Hospital Stay: Payer: BC Managed Care – PPO | Attending: Internal Medicine

## 2022-01-28 ENCOUNTER — Other Ambulatory Visit: Payer: BC Managed Care – PPO

## 2022-01-28 ENCOUNTER — Inpatient Hospital Stay (HOSPITAL_BASED_OUTPATIENT_CLINIC_OR_DEPARTMENT_OTHER): Payer: BC Managed Care – PPO | Admitting: Internal Medicine

## 2022-01-28 ENCOUNTER — Ambulatory Visit: Payer: BC Managed Care – PPO | Admitting: Internal Medicine

## 2022-01-28 ENCOUNTER — Other Ambulatory Visit: Payer: Self-pay

## 2022-01-28 VITALS — BP 116/91 | HR 84 | Temp 98.6°F | Resp 18 | Ht 68.0 in | Wt 189.2 lb

## 2022-01-28 DIAGNOSIS — C3491 Malignant neoplasm of unspecified part of right bronchus or lung: Secondary | ICD-10-CM

## 2022-01-28 DIAGNOSIS — I10 Essential (primary) hypertension: Secondary | ICD-10-CM | POA: Diagnosis not present

## 2022-01-28 DIAGNOSIS — C770 Secondary and unspecified malignant neoplasm of lymph nodes of head, face and neck: Secondary | ICD-10-CM | POA: Insufficient documentation

## 2022-01-28 DIAGNOSIS — C3411 Malignant neoplasm of upper lobe, right bronchus or lung: Secondary | ICD-10-CM

## 2022-01-28 DIAGNOSIS — D649 Anemia, unspecified: Secondary | ICD-10-CM | POA: Diagnosis not present

## 2022-01-28 DIAGNOSIS — Z5111 Encounter for antineoplastic chemotherapy: Secondary | ICD-10-CM

## 2022-01-28 DIAGNOSIS — Z7901 Long term (current) use of anticoagulants: Secondary | ICD-10-CM | POA: Diagnosis not present

## 2022-01-28 DIAGNOSIS — Z79890 Hormone replacement therapy (postmenopausal): Secondary | ICD-10-CM | POA: Diagnosis not present

## 2022-01-28 DIAGNOSIS — E119 Type 2 diabetes mellitus without complications: Secondary | ICD-10-CM | POA: Insufficient documentation

## 2022-01-28 DIAGNOSIS — D72829 Elevated white blood cell count, unspecified: Secondary | ICD-10-CM | POA: Diagnosis not present

## 2022-01-28 DIAGNOSIS — E039 Hypothyroidism, unspecified: Secondary | ICD-10-CM | POA: Diagnosis not present

## 2022-01-28 DIAGNOSIS — C3490 Malignant neoplasm of unspecified part of unspecified bronchus or lung: Secondary | ICD-10-CM

## 2022-01-28 DIAGNOSIS — Z79899 Other long term (current) drug therapy: Secondary | ICD-10-CM | POA: Diagnosis not present

## 2022-01-28 LAB — CBC WITH DIFFERENTIAL (CANCER CENTER ONLY)
Abs Immature Granulocytes: 0.09 10*3/uL — ABNORMAL HIGH (ref 0.00–0.07)
Basophils Absolute: 0.1 10*3/uL (ref 0.0–0.1)
Basophils Relative: 1 %
Eosinophils Absolute: 0.3 10*3/uL (ref 0.0–0.5)
Eosinophils Relative: 2 %
HCT: 37.1 % — ABNORMAL LOW (ref 39.0–52.0)
Hemoglobin: 12.8 g/dL — ABNORMAL LOW (ref 13.0–17.0)
Immature Granulocytes: 1 %
Lymphocytes Relative: 9 %
Lymphs Abs: 1.3 10*3/uL (ref 0.7–4.0)
MCH: 32.1 pg (ref 26.0–34.0)
MCHC: 34.5 g/dL (ref 30.0–36.0)
MCV: 93 fL (ref 80.0–100.0)
Monocytes Absolute: 1.5 10*3/uL — ABNORMAL HIGH (ref 0.1–1.0)
Monocytes Relative: 11 %
Neutro Abs: 10.9 10*3/uL — ABNORMAL HIGH (ref 1.7–7.7)
Neutrophils Relative %: 76 %
Platelet Count: 295 10*3/uL (ref 150–400)
RBC: 3.99 MIL/uL — ABNORMAL LOW (ref 4.22–5.81)
RDW: 15.9 % — ABNORMAL HIGH (ref 11.5–15.5)
WBC Count: 14.2 10*3/uL — ABNORMAL HIGH (ref 4.0–10.5)
nRBC: 0 % (ref 0.0–0.2)

## 2022-01-28 LAB — VITAMIN B12: Vitamin B-12: 872 pg/mL (ref 180–914)

## 2022-01-28 LAB — CMP (CANCER CENTER ONLY)
ALT: 31 U/L (ref 0–44)
AST: 25 U/L (ref 15–41)
Albumin: 4.2 g/dL (ref 3.5–5.0)
Alkaline Phosphatase: 139 U/L — ABNORMAL HIGH (ref 38–126)
Anion gap: 8 (ref 5–15)
BUN: 16 mg/dL (ref 6–20)
CO2: 25 mmol/L (ref 22–32)
Calcium: 9.3 mg/dL (ref 8.9–10.3)
Chloride: 103 mmol/L (ref 98–111)
Creatinine: 1 mg/dL (ref 0.61–1.24)
GFR, Estimated: >60 mL/min (ref >60)
Glucose, Bld: 94 mg/dL (ref 70–99)
Potassium: 3.9 mmol/L (ref 3.5–5.1)
Sodium: 136 mmol/L (ref 135–145)
Total Bilirubin: 0.8 mg/dL (ref 0.3–1.2)
Total Protein: 6.8 g/dL (ref 6.5–8.1)

## 2022-01-28 LAB — IRON AND IRON BINDING CAPACITY (CC-WL,HP ONLY)
Iron: 47 ug/dL (ref 45–182)
Saturation Ratios: 16 % — ABNORMAL LOW (ref 17.9–39.5)
TIBC: 304 ug/dL (ref 250–450)
UIBC: 257 ug/dL (ref 117–376)

## 2022-01-28 LAB — FERRITIN: Ferritin: 149 ng/mL (ref 24–336)

## 2022-01-28 LAB — FOLATE: Folate: 21.7 ng/mL (ref >5.9)

## 2022-01-28 NOTE — Progress Notes (Signed)
[place] [phone]   [phone]  OFFICE PROGRESS NOTE  [name], MD [place] [id number]  DIAGNOSIS:  Metastatic non-small cell lung cancer initially diagnosed as stage IIIB (T1c, N3, M0) non-small cell lung cancer, adenocarcinoma presented with right upper lobe lung mass in addition to mediastinal and bilateral supraclavicular lymphadenopathy diagnosed in January 2020.  He had evidence of disease recurrence in June 2021 with adenopathy in the subcarinal, level 3 left neck lymph node, and lymph node at the curious of the diaphragm and in the upper abdomen.   PD-L1: 10%   Guardant 360 molecular studies showed no actionable mutation   [facility] Testing:   Biomarker Findings Microsatellite status - MS-Stable Tumor Mutational Burden - 4 Muts/Mb Genomic Findings For a complete list of the genes assayed, please refer to the Appendix. ALK EML4-ALK fusion (Variant 2) SMARCB1 R377C CTNNB1 S45P CDKN2A/B CDKN2B loss, CDKN2A loss CXCR4 X646* OE32 splice site 122Q>M 7 Disease relevant genes with no reportable alterations: BRAF, EGFR, ERBB2, KRAS, MET, RET, ROS1  PRIOR THERAPY: 1) Concurrent chemoradiation with weekly carboplatin for AUC of 2 and paclitaxel 45 mg/M2.  Status post 6 cycles.  Last dose was given on October 10, 2018 with stable disease. 2) Radiation treatment to the enlarging right cervical lymph nodes under the care of Dr. Sondra Come. First treatment 03/06/2019. Last treatment scheduled on 04/13/2019 3) Consolidation treatment with immunotherapy with Imfinzi 10 mg/KG every 2 weeks.  First dose November 17, 2018.  Status post 26 cycles. 4) Systemic chemotherapy with carboplatin for an AUC of 5, Alimta 500 mg/m2, and Keytruda 200 mg IV every 3 weeks. First dose expected on 02/14/20. Status post 1 cycle.  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 5) SBRT to the left lower lobe lung nodule.   CURRENT THERAPY:  [name] ([name]) 600 mg p.o. twice daily.  He started the first dose on March 08, 2020.   Status post 23 months of treatment.  INTERVAL HISTORY: Brian [name] 61 y.o. male returns to the clinic today for follow-up visit.  The patient is feeling fine today with no concerning complaints except for increasing fatigue recently.  He is very busy at work and has some stressful time with some new projects.  He denied having any current chest pain, shortness of breath, cough or hemoptysis.  He has no nausea, vomiting, diarrhea or constipation.  He has no headache or visual changes.  He has no recent weight loss or night sweats.  He continues to tolerate his treatment with [name] fairly well.  He is here today for evaluation and repeat blood work.  He underwent ultrasound guided right thoracentesis on Dec 31, 2021 with drainage of 1.3 L of clear yellow pleural fluid.  He felt much better after the drainage.  MEDICAL HISTORY: Past Medical History:  Diagnosis Date   COPD (chronic obstructive pulmonary disease) ([place])    Diabetes mellitus without complication ([place])    Hypertension    nscl ca dx'd 08/2018   Pneumonia    Subarachnoid hemorrhage ([facility])     ALLERGIES:  has No Known Allergies.  MEDICATIONS:  Current Outpatient Medications  Medication Sig Dispense Refill   albuterol (VENTOLIN HFA) 108 (90 Base) MCG/ACT inhaler INHALE 2 PUFFS INTO THE LUNGS EVERY 4 (FOUR) HOURS AS NEEDED FOR WHEEZING OR SHORTNESS OF BREATH. 6.7 each  4   [name] 150 MG capsule TAKE 4 CAPSULES BY MOUTH TWICE DAILY WITH FOOD. SWALLOW CAPSULES WHOLE. DO NOT OPEN OR CRUSH. STORE IN ORIGINAL CONTAINER 240 capsule 2   amLODipine (NORVASC) 2.5 MG tablet Take 2.5 mg by mouth daily.     B Complex Vitamins (B COMPLEX PO) Take 1 tablet by mouth daily.     ELIQUIS 5 MG TABS tablet TAKE 1 TABLET BY MOUTH TWICE A DAY 60 tablet 2    levothyroxine (SYNTHROID) 100 MCG tablet TAKE 1 TABLET BY MOUTH DAILY BEFORE BREAKFAST. 90 tablet 1   Multiple Vitamin (MULTIVITAMIN) tablet Take 1 tablet by mouth daily.     TRELEGY ELLIPTA 100-62.5-25 MCG/INH AEPB Inhale 1 puff into the lungs daily.     venlafaxine (EFFEXOR) 37.5 MG tablet Take 37.5 mg by mouth daily.      No current facility-administered medications for this visit.    SURGICAL HISTORY:  Past Surgical History:  Procedure Laterality Date   INGUINAL HERNIA REPAIR Right 06/24/2020   Procedure: RIGHT OPEN INGUINAL HERNIA REPAIR WITH MESH;  Surgeon: Kinsinger, Arta Bruce, MD;  Location: [facility];  Service: General;  Laterality: Right;   IR IVC FILTER PLMT / S&I Burke Keels GUID/MOD SED  03/04/2020   IR IVC FILTER RETRIEVAL / S&I Burke Keels GUID/MOD SED  08/15/2020   IR RADIOLOGIST EVAL & MGMT  08/06/2020   LEG SURGERY  age 35    left leg, from fracture    REVIEW OF SYSTEMS:  A comprehensive review of systems was negative except for: Constitutional: positive for fatigue   PHYSICAL EXAMINATION: General appearance: alert, cooperative, fatigued, and no distress Head: Normocephalic, without obvious abnormality, atraumatic Neck: no adenopathy, no JVD, supple, symmetrical, trachea midline, and thyroid not enlarged, symmetric, no tenderness/mass/nodules Lymph nodes: Cervical, supraclavicular, and axillary nodes normal. Resp: clear to auscultation bilaterally Back: symmetric, no curvature. ROM normal. No CVA tenderness. Cardio: regular rate and rhythm, S1, S2 normal, no murmur, click, rub or gallop GI: soft, non-tender; bowel sounds normal; no masses,  no organomegaly Extremities: extremities normal, atraumatic, no cyanosis or edema  ECOG PERFORMANCE STATUS: 1 - Symptomatic but completely ambulatory  Blood pressure (!) 116/91, pulse 84, temperature 98.6 F (37 C), temperature source Tympanic, resp. rate 18, height $RemoveBe'5\' 8"'aWWjcOaSY$  (1.727 m), weight 189 lb 3.2 oz (85.8 kg), SpO2 97 %.  LABORATORY DATA: Lab  Results  Component Value Date   WBC 14.2 (H) 01/28/2022   HGB 12.8 (L) 01/28/2022   HCT 37.1 (L) 01/28/2022   MCV 93.0 01/28/2022   PLT 295 01/28/2022      Chemistry      Component Value Date/Time   NA 138 11/13/2021 1205   K 3.6 11/13/2021 1205   CL 106 11/13/2021 1205   CO2 26 11/13/2021 1205   BUN 15 11/13/2021 1205   CREATININE 0.92 11/13/2021 1205      Component Value Date/Time   CALCIUM 8.6 (L) 11/13/2021 1205   ALKPHOS 123 11/13/2021 1205   AST 28 11/13/2021 1205   ALT 32 11/13/2021 1205   BILITOT 0.6 11/13/2021 1205       RADIOGRAPHIC STUDIES: US Thoracentesis Asp Pleural space w/IMG guide  Result Date: 12/31/2021 INDICATION: Patient with history of lung cancer with recurrent pleural effusion. Request for therapeutic right thoracentesis. EXAM: ULTRASOUND GUIDED THERAPEUTIC RIGHT THORACENTESIS MEDICATIONS: 10 mL 1 % lidocaine COMPLICATIONS: None PROCEDURE: An ultrasound guided thoracentesis was thoroughly discussed with the patient and questions answered. The benefits, risks, alternatives and complications were also discussed.  The patient understands and wishes to proceed with the procedure. Written consent was obtained. Ultrasound was performed to localize and mark an adequate pocket of fluid in the right chest. The area was then prepped and draped in the normal sterile fashion. 1% Lidocaine was used for local anesthesia. Under ultrasound guidance a 6 Fr Safe-T-Centesis catheter was introduced. Thoracentesis was performed. The catheter was removed and a dressing applied. FINDINGS: A total of approximately 1.3 L of clear, yellow fluid was removed. IMPRESSION: Successful ultrasound guided right thoracentesis yielding 1.3 L of pleural fluid. Postprocedural chest radiograph was negative for pneumothorax. Read by: Narda Rutherford, AGNP-BC Electronically Signed   By: Sandi Mariscal M.D.   On: 12/31/2021 15:09   DG Chest Port 1 View  Result Date: 12/31/2021 CLINICAL DATA:  Post  right-sided thoracentesis EXAM: PORTABLE CHEST 1 VIEW COMPARISON:  Chest CT-11/13/2021 Chest radiograph - 08/14/2021 FINDINGS: Grossly unchanged cardiac silhouette and mediastinal contours including known right perihilar mass and associated perihilar volume loss. Interval reduction/resolution of right-sided pleural effusion post thoracentesis. No pneumothorax. The left lung remains well aerated. No evidence of edema. No acute osseous abnormalities. Degenerative change the right acromioclavicular joint is suspected though incompletely evaluated. IMPRESSION: 1. Interval reduction/resolution of right-sided pleural effusion post thoracentesis. No pneumothorax. 2. Redemonstrated known right perihilar mass and associated atelectasis. Electronically Signed   By: Sandi Mariscal M.D.   On: 12/31/2021 15:09     ASSESSMENT AND PLAN: This is a very pleasant 61 years old white male with metastatic non-small cell lung cancer, adenocarcinoma now with ALK gene translocation.  The patient was initially diagnosed as a stage IIIa non-small cell lung cancer status post a course of concurrent chemoradiation with weekly carboplatin and paclitaxel followed by 1 year of consolidation treatment with immunotherapy with Imfinzi. He had evidence for disease progression recently. Repeat tissue biopsy from the left supraclavicular lymph nodes and molecular studies showed that the patient has positive ALK gene translocation.  His previous molecular studies by [employer] 360 was negative. I had a lengthy discussion with the patient today about his condition and treatment options. He received 1 cycle of systemic chemotherapy with carboplatin, Alimta and Keytruda.  We will discontinue his systemic chemotherapy for now because of the new findings on the molecular studies. The patient is currently on treatment with [name] 600 mg p.o. twice daily.  He is status post 23 months of treatment.   The patient has been tolerating this treatment well  with no concerning adverse effects. His lab work is unremarkable except for the mild anemia and his hemoglobin is up to 12.8 today.  He also has mild leukocytosis. I recommended for the patient to continue his current treatment with [name] ([name]) with the same dose. I will see him back for follow-up visit in 2 months for evaluation and repeat CT scan of the chest, abdomen and pelvis for restaging of his disease. For the anemia, he will continue with the over-the-counter oral iron tablets. For the hypothyroidism he is currently on levothyroxine. The patient was advised to call immediately if he has any other concerning symptoms in the interval. The patient voices understanding of current disease status and treatment options and is in agreement with the current care plan.  All questions were answered. The patient knows to call the clinic with any problems, questions or concerns. We can certainly see the patient much sooner if necessary.  Disclaimer: This note was dictated with voice recognition software. Similar sounding words can inadvertently be transcribed and may  not be corrected upon review.

## 2022-01-29 ENCOUNTER — Encounter: Payer: Self-pay | Admitting: Internal Medicine

## 2022-01-30 ENCOUNTER — Other Ambulatory Visit: Payer: Self-pay | Admitting: Medical Oncology

## 2022-01-30 DIAGNOSIS — D72829 Elevated white blood cell count, unspecified: Secondary | ICD-10-CM

## 2022-01-30 DIAGNOSIS — C3490 Malignant neoplasm of unspecified part of unspecified bronchus or lung: Secondary | ICD-10-CM

## 2022-02-09 ENCOUNTER — Telehealth: Payer: Self-pay | Admitting: Internal Medicine

## 2022-02-09 NOTE — Telephone Encounter (Signed)
Scheduled per 06/29 los, patient has been called and voicemail was left regarding upcoming appointments.

## 2022-02-12 ENCOUNTER — Inpatient Hospital Stay: Payer: BC Managed Care – PPO | Attending: Internal Medicine

## 2022-02-12 ENCOUNTER — Other Ambulatory Visit: Payer: Self-pay

## 2022-02-12 DIAGNOSIS — D72829 Elevated white blood cell count, unspecified: Secondary | ICD-10-CM

## 2022-02-12 DIAGNOSIS — C770 Secondary and unspecified malignant neoplasm of lymph nodes of head, face and neck: Secondary | ICD-10-CM | POA: Diagnosis not present

## 2022-02-12 DIAGNOSIS — C3411 Malignant neoplasm of upper lobe, right bronchus or lung: Secondary | ICD-10-CM | POA: Diagnosis not present

## 2022-02-12 LAB — CBC WITH DIFFERENTIAL (CANCER CENTER ONLY)
Abs Immature Granulocytes: 0.14 10*3/uL — ABNORMAL HIGH (ref 0.00–0.07)
Basophils Absolute: 0.1 10*3/uL (ref 0.0–0.1)
Basophils Relative: 1 %
Eosinophils Absolute: 0.3 10*3/uL (ref 0.0–0.5)
Eosinophils Relative: 3 %
HCT: 37 % — ABNORMAL LOW (ref 39.0–52.0)
Hemoglobin: 12.8 g/dL — ABNORMAL LOW (ref 13.0–17.0)
Immature Granulocytes: 1 %
Lymphocytes Relative: 14 %
Lymphs Abs: 1.6 10*3/uL (ref 0.7–4.0)
MCH: 32.2 pg (ref 26.0–34.0)
MCHC: 34.6 g/dL (ref 30.0–36.0)
MCV: 93.2 fL (ref 80.0–100.0)
Monocytes Absolute: 1.6 10*3/uL — ABNORMAL HIGH (ref 0.1–1.0)
Monocytes Relative: 14 %
Neutro Abs: 7.7 10*3/uL (ref 1.7–7.7)
Neutrophils Relative %: 67 %
Platelet Count: 235 10*3/uL (ref 150–400)
RBC: 3.97 MIL/uL — ABNORMAL LOW (ref 4.22–5.81)
RDW: 16 % — ABNORMAL HIGH (ref 11.5–15.5)
WBC Count: 11.5 10*3/uL — ABNORMAL HIGH (ref 4.0–10.5)
nRBC: 0 % (ref 0.0–0.2)

## 2022-02-13 ENCOUNTER — Other Ambulatory Visit: Payer: Self-pay | Admitting: Internal Medicine

## 2022-02-13 DIAGNOSIS — C3491 Malignant neoplasm of unspecified part of right bronchus or lung: Secondary | ICD-10-CM

## 2022-02-16 ENCOUNTER — Encounter: Payer: Self-pay | Admitting: Internal Medicine

## 2022-02-16 DIAGNOSIS — Z79890 Hormone replacement therapy (postmenopausal): Secondary | ICD-10-CM | POA: Diagnosis not present

## 2022-02-16 DIAGNOSIS — E039 Hypothyroidism, unspecified: Secondary | ICD-10-CM | POA: Diagnosis not present

## 2022-02-16 DIAGNOSIS — Z0000 Encounter for general adult medical examination without abnormal findings: Secondary | ICD-10-CM | POA: Diagnosis not present

## 2022-02-16 DIAGNOSIS — Z125 Encounter for screening for malignant neoplasm of prostate: Secondary | ICD-10-CM | POA: Diagnosis not present

## 2022-02-16 DIAGNOSIS — I10 Essential (primary) hypertension: Secondary | ICD-10-CM | POA: Diagnosis not present

## 2022-02-16 DIAGNOSIS — Z79899 Other long term (current) drug therapy: Secondary | ICD-10-CM | POA: Diagnosis not present

## 2022-02-16 DIAGNOSIS — E1165 Type 2 diabetes mellitus with hyperglycemia: Secondary | ICD-10-CM | POA: Diagnosis not present

## 2022-02-16 DIAGNOSIS — E782 Mixed hyperlipidemia: Secondary | ICD-10-CM | POA: Diagnosis not present

## 2022-02-16 DIAGNOSIS — J439 Emphysema, unspecified: Secondary | ICD-10-CM | POA: Diagnosis not present

## 2022-02-23 ENCOUNTER — Other Ambulatory Visit: Payer: Self-pay

## 2022-03-30 ENCOUNTER — Other Ambulatory Visit: Payer: Self-pay

## 2022-03-30 ENCOUNTER — Inpatient Hospital Stay: Payer: BC Managed Care – PPO | Attending: Internal Medicine

## 2022-03-30 ENCOUNTER — Encounter (HOSPITAL_COMMUNITY): Payer: Self-pay

## 2022-03-30 ENCOUNTER — Ambulatory Visit (HOSPITAL_COMMUNITY)
Admission: RE | Admit: 2022-03-30 | Discharge: 2022-03-30 | Disposition: A | Discharge status: Home / Self Care | Payer: BC Managed Care – PPO | Source: Ambulatory Visit | Attending: Internal Medicine | Admitting: Internal Medicine

## 2022-03-30 DIAGNOSIS — N281 Cyst of kidney, acquired: Secondary | ICD-10-CM | POA: Diagnosis not present

## 2022-03-30 DIAGNOSIS — C3490 Malignant neoplasm of unspecified part of unspecified bronchus or lung: Secondary | ICD-10-CM

## 2022-03-30 DIAGNOSIS — E039 Hypothyroidism, unspecified: Secondary | ICD-10-CM | POA: Insufficient documentation

## 2022-03-30 DIAGNOSIS — Z7901 Long term (current) use of anticoagulants: Secondary | ICD-10-CM | POA: Insufficient documentation

## 2022-03-30 DIAGNOSIS — E119 Type 2 diabetes mellitus without complications: Secondary | ICD-10-CM | POA: Insufficient documentation

## 2022-03-30 DIAGNOSIS — Z79899 Other long term (current) drug therapy: Secondary | ICD-10-CM | POA: Insufficient documentation

## 2022-03-30 DIAGNOSIS — D649 Anemia, unspecified: Secondary | ICD-10-CM | POA: Insufficient documentation

## 2022-03-30 DIAGNOSIS — C3411 Malignant neoplasm of upper lobe, right bronchus or lung: Secondary | ICD-10-CM | POA: Insufficient documentation

## 2022-03-30 DIAGNOSIS — I10 Essential (primary) hypertension: Secondary | ICD-10-CM | POA: Insufficient documentation

## 2022-03-30 DIAGNOSIS — C770 Secondary and unspecified malignant neoplasm of lymph nodes of head, face and neck: Secondary | ICD-10-CM | POA: Diagnosis not present

## 2022-03-30 DIAGNOSIS — J181 Lobar pneumonia, unspecified organism: Secondary | ICD-10-CM | POA: Diagnosis not present

## 2022-03-30 LAB — CBC WITH DIFFERENTIAL (CANCER CENTER ONLY)
Abs Immature Granulocytes: 0.06 10*3/uL (ref 0.00–0.07)
Basophils Absolute: 0.1 10*3/uL (ref 0.0–0.1)
Basophils Relative: 1 %
Eosinophils Absolute: 0.3 10*3/uL (ref 0.0–0.5)
Eosinophils Relative: 3 %
HCT: 36.5 % — ABNORMAL LOW (ref 39.0–52.0)
Hemoglobin: 12.5 g/dL — ABNORMAL LOW (ref 13.0–17.0)
Immature Granulocytes: 1 %
Lymphocytes Relative: 13 %
Lymphs Abs: 1.4 10*3/uL (ref 0.7–4.0)
MCH: 32.4 pg (ref 26.0–34.0)
MCHC: 34.2 g/dL (ref 30.0–36.0)
MCV: 94.6 fL (ref 80.0–100.0)
Monocytes Absolute: 1.4 10*3/uL — ABNORMAL HIGH (ref 0.1–1.0)
Monocytes Relative: 13 %
Neutro Abs: 7.5 10*3/uL (ref 1.7–7.7)
Neutrophils Relative %: 69 %
Platelet Count: 241 10*3/uL (ref 150–400)
RBC: 3.86 MIL/uL — ABNORMAL LOW (ref 4.22–5.81)
RDW: 15.2 % (ref 11.5–15.5)
WBC Count: 10.8 10*3/uL — ABNORMAL HIGH (ref 4.0–10.5)
nRBC: 0 % (ref 0.0–0.2)

## 2022-03-30 LAB — CMP (CANCER CENTER ONLY)
ALT: 28 U/L (ref 0–44)
AST: 25 U/L (ref 15–41)
Albumin: 4.2 g/dL (ref 3.5–5.0)
Alkaline Phosphatase: 116 U/L (ref 38–126)
Anion gap: 5 (ref 5–15)
BUN: 18 mg/dL (ref 6–20)
CO2: 27 mmol/L (ref 22–32)
Calcium: 9 mg/dL (ref 8.9–10.3)
Chloride: 107 mmol/L (ref 98–111)
Creatinine: 1.04 mg/dL (ref 0.61–1.24)
GFR, Estimated: >60 mL/min (ref >60)
Glucose, Bld: 104 mg/dL — ABNORMAL HIGH (ref 70–99)
Potassium: 4 mmol/L (ref 3.5–5.1)
Sodium: 139 mmol/L (ref 135–145)
Total Bilirubin: 0.6 mg/dL (ref 0.3–1.2)
Total Protein: 6.6 g/dL (ref 6.5–8.1)

## 2022-03-30 MED ORDER — IOHEXOL 300 MG/ML  SOLN
100.0000 mL | Freq: Once | INTRAMUSCULAR | Status: AC | PRN
  Administered 2022-03-30: 100 mL via INTRAVENOUS

## 2022-03-30 MED ORDER — SODIUM CHLORIDE (PF) 0.9 % IJ SOLN
INTRAMUSCULAR | Status: AC
  Filled 2022-03-30: qty 50

## 2022-04-01 ENCOUNTER — Inpatient Hospital Stay (HOSPITAL_BASED_OUTPATIENT_CLINIC_OR_DEPARTMENT_OTHER): Payer: BC Managed Care – PPO | Admitting: Internal Medicine

## 2022-04-01 ENCOUNTER — Other Ambulatory Visit: Payer: Self-pay

## 2022-04-01 VITALS — BP 111/85 | HR 83 | Temp 97.8°F | Resp 16 | Wt 191.2 lb

## 2022-04-01 DIAGNOSIS — Z79899 Other long term (current) drug therapy: Secondary | ICD-10-CM | POA: Diagnosis not present

## 2022-04-01 DIAGNOSIS — E039 Hypothyroidism, unspecified: Secondary | ICD-10-CM | POA: Diagnosis not present

## 2022-04-01 DIAGNOSIS — C770 Secondary and unspecified malignant neoplasm of lymph nodes of head, face and neck: Secondary | ICD-10-CM | POA: Diagnosis not present

## 2022-04-01 DIAGNOSIS — Z7901 Long term (current) use of anticoagulants: Secondary | ICD-10-CM | POA: Diagnosis not present

## 2022-04-01 DIAGNOSIS — C3491 Malignant neoplasm of unspecified part of right bronchus or lung: Secondary | ICD-10-CM

## 2022-04-01 DIAGNOSIS — E119 Type 2 diabetes mellitus without complications: Secondary | ICD-10-CM | POA: Diagnosis not present

## 2022-04-01 DIAGNOSIS — D649 Anemia, unspecified: Secondary | ICD-10-CM | POA: Diagnosis not present

## 2022-04-01 DIAGNOSIS — I10 Essential (primary) hypertension: Secondary | ICD-10-CM | POA: Diagnosis not present

## 2022-04-01 DIAGNOSIS — C3411 Malignant neoplasm of upper lobe, right bronchus or lung: Secondary | ICD-10-CM | POA: Diagnosis not present

## 2022-04-01 NOTE — Progress Notes (Signed)
[facility] [phone]   [phone]  OFFICE PROGRESS NOTE  [name], MD [place] [id number]  DIAGNOSIS:  Metastatic non-small cell lung cancer initially diagnosed as stage IIIB (T1c, N3, M0) non-small cell lung cancer, adenocarcinoma presented with right upper lobe lung mass in addition to mediastinal and bilateral supraclavicular lymphadenopathy diagnosed in January 2020.  He had evidence of disease recurrence in June 2021 with adenopathy in the subcarinal, level 3 left neck lymph node, and lymph node at the curious of the diaphragm and in the upper abdomen.   PD-L1: 10%   Guardant 360 molecular studies showed no actionable mutation   [facility] Testing:   Biomarker Findings Microsatellite status - MS-Stable Tumor Mutational Burden - 4 Muts/Mb Genomic Findings For a complete list of the genes assayed, please refer to the Appendix. ALK EML4-ALK fusion (Variant 2) SMARCB1 R377C CTNNB1 S45P CDKN2A/B CDKN2B loss, CDKN2A loss CXCR4 W119* JY78 splice site 295A>O 7 Disease relevant genes with no reportable alterations: BRAF, EGFR, ERBB2, KRAS, MET, RET, ROS1  PRIOR THERAPY: 1) Concurrent chemoradiation with weekly carboplatin for AUC of 2 and paclitaxel 45 mg/M2.  Status post 6 cycles.  Last dose was given on October 10, 2018 with stable disease. 2) Radiation treatment to the enlarging right cervical lymph nodes under the care of Dr. Sondra Come. First treatment 03/06/2019. Last treatment scheduled on 04/13/2019 3) Consolidation treatment with immunotherapy with Imfinzi 10 mg/KG every 2 weeks.  First dose November 17, 2018.  Status post 26 cycles. 4) Systemic chemotherapy with carboplatin for an AUC of 5, Alimta 500 mg/m2, and Keytruda 200 mg IV every 3 weeks. First dose expected on 02/14/20. Status post 1 cycle.  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 5) SBRT to the left lower lobe lung nodule.   CURRENT THERAPY:  [name] ([name]) 600 mg p.o. twice daily.  He started the first dose on March 08, 2020.   Status post 25 months of treatment.  INTERVAL HISTORY: Brian [name] 61 y.o. male returns to the clinic today for follow-up visit.  The patient is feeling fine today with no concerning complaints except for mild fatigue.  He has no current chest pain, shortness of breath, cough or hemoptysis.  He has no nausea, vomiting, diarrhea or constipation.  He has no headache or visual changes.  He denied having any recent weight loss or night sweats.  He has no fever or chills.  He has been tolerating his treatment with [name] fairly well.  The patient has repeat CT scan of the chest, abdomen and pelvis performed recently and he is here for evaluation and discussion of his scan results.  MEDICAL HISTORY: Past Medical History:  Diagnosis Date   COPD (chronic obstructive pulmonary disease) ([facility])    Diabetes mellitus without complication ([facility])    Hypertension    nscl ca dx'd 08/2018   Pneumonia    Subarachnoid hemorrhage ([facility])     ALLERGIES:  has No Known Allergies.  MEDICATIONS:  Current Outpatient Medications  Medication Sig Dispense Refill   albuterol (VENTOLIN HFA) 108 (90 Base) MCG/ACT inhaler INHALE 2 PUFFS INTO THE LUNGS EVERY 4 (FOUR) HOURS AS NEEDED FOR WHEEZING OR SHORTNESS OF BREATH. 6.7 each 4   [name] 150 MG capsule TAKE 4 CAPSULES BY MOUTH TWICE DAILY WITH FOOD. SWALLOW CAPSULES WHOLE. DO NOT OPEN OR  CRUSH. STORE IN ORIGINAL CONTAINER 240 capsule 2   amLODipine (NORVASC) 2.5 MG tablet Take 2.5 mg by mouth daily.     B Complex Vitamins (B COMPLEX PO) Take 1 tablet by mouth daily.     ELIQUIS 5 MG TABS tablet TAKE 1 TABLET BY MOUTH TWICE A DAY 60 tablet 2   levothyroxine (SYNTHROID) 100 MCG tablet TAKE 1 TABLET BY MOUTH DAILY BEFORE BREAKFAST. 90 tablet 1   Multiple Vitamin (MULTIVITAMIN) tablet Take  1 tablet by mouth daily.     TRELEGY ELLIPTA 100-62.5-25 MCG/INH AEPB Inhale 1 puff into the lungs daily.     venlafaxine (EFFEXOR) 37.5 MG tablet Take 37.5 mg by mouth daily.      No current facility-administered medications for this visit.    SURGICAL HISTORY:  Past Surgical History:  Procedure Laterality Date   INGUINAL HERNIA REPAIR Right 06/24/2020   Procedure: RIGHT OPEN INGUINAL HERNIA REPAIR WITH MESH;  Surgeon: Kinsinger, Arta Bruce, MD;  Location: [facility];  Service: General;  Laterality: Right;   IR IVC FILTER PLMT / S&I Burke Keels GUID/MOD SED  03/04/2020   IR IVC FILTER RETRIEVAL / S&I Burke Keels GUID/MOD SED  08/15/2020   IR RADIOLOGIST EVAL & MGMT  08/06/2020   LEG SURGERY  age 24    left leg, from fracture    REVIEW OF SYSTEMS:  Constitutional: positive for fatigue Eyes: negative Ears, nose, mouth, throat, and face: negative Respiratory: negative Cardiovascular: negative Gastrointestinal: negative Genitourinary:negative Integument/breast: negative Hematologic/lymphatic: negative Musculoskeletal:negative Neurological: negative Behavioral/Psych: negative Endocrine: negative Allergic/Immunologic: negative   PHYSICAL EXAMINATION: General appearance: alert, cooperative, fatigued, and no distress Head: Normocephalic, without obvious abnormality, atraumatic Neck: no adenopathy, no JVD, supple, symmetrical, trachea midline, and thyroid not enlarged, symmetric, no tenderness/mass/nodules Lymph nodes: Cervical, supraclavicular, and axillary nodes normal. Resp: clear to auscultation bilaterally Back: symmetric, no curvature. ROM normal. No CVA tenderness. Cardio: regular rate and rhythm, S1, S2 normal, no murmur, click, rub or gallop GI: soft, non-tender; bowel sounds normal; no masses,  no organomegaly Extremities: extremities normal, atraumatic, no cyanosis or edema Neurologic: Alert and oriented X 3, normal strength and tone. Normal symmetric reflexes. Normal coordination and  gait  ECOG PERFORMANCE STATUS: 1 - Symptomatic but completely ambulatory  Blood pressure 111/85, pulse 83, temperature 97.8 F (36.6 C), resp. rate 16, weight 191 lb 3.2 oz (86.7 kg), SpO2 97 %.  LABORATORY DATA: Lab Results  Component Value Date   WBC 10.8 (H) 03/30/2022   HGB 12.5 (L) 03/30/2022   HCT 36.5 (L) 03/30/2022   MCV 94.6 03/30/2022   PLT 241 03/30/2022      Chemistry      Component Value Date/Time   NA 139 03/30/2022 1333   K 4.0 03/30/2022 1333   CL 107 03/30/2022 1333   CO2 27 03/30/2022 1333   BUN 18 03/30/2022 1333   CREATININE 1.04 03/30/2022 1333      Component Value Date/Time   CALCIUM 9.0 03/30/2022 1333   ALKPHOS 116 03/30/2022 1333   AST 25 03/30/2022 1333   ALT 28 03/30/2022 1333   BILITOT 0.6 03/30/2022 1333       RADIOGRAPHIC STUDIES: CT Chest W Contrast  Result Date: 03/31/2022 CLINICAL DATA:  Primary Cancer Type: Lung Imaging Indication: Assess response to therapy Interval therapy since last imaging? Yes Initial Cancer Diagnosis Date: 08/23/2018; Established by: Biopsy-proven Detailed Pathology: Stage IIIB non-small cell lung cancer, adenocarcinoma. Primary Tumor location:  Right upper lobe. Recurrence? Yes; Date(s) of recurrence: 02/16/2020; Established by: Biopsy-proven  Surgeries: Right inguinal hernia repair with mesh 06/24/2020. Chemotherapy: Yes; Ongoing? Yes; daily [name] Immunotherapy?  Yes; Type: Imfinzi; Ongoing? No Radiation therapy? Yes Date Range: 03/06/2019 - 04/13/2019; Target: Right neck Date Range: 09/05/2018 - 10/14/2018; Target: Right lung * Tracking Code: BO * EXAM: CT CHEST, ABDOMEN, AND PELVIS WITH CONTRAST TECHNIQUE: Multidetector CT imaging of the chest, abdomen and pelvis was performed following the standard protocol during bolus administration of intravenous contrast. RADIATION DOSE REDUCTION: This exam was performed according to the departmental dose-optimization program which includes automated exposure control,  adjustment of the mA and/or kV according to patient size and/or use of iterative reconstruction technique. CONTRAST:  172m OMNIPAQUE IOHEXOL 300 MG/ML SOLN, additional oral enteric contrast COMPARISON:  Most recent CT chest, abdomen and pelvis 11/13/2021. 02/02/2020 PET-CT. FINDINGS: CT CHEST FINDINGS Cardiovascular: Scattered aortic atherosclerosis. Normal heart size. Left coronary artery calcifications. No pericardial effusion. Mediastinum/Nodes: No enlarged mediastinal, hilar, or axillary lymph nodes. Thyroid gland, trachea, and esophagus demonstrate no significant findings. Lungs/Pleura: Unchanged post treatment appearance of the perihilar right lung with consolidation and fibrosis (series 6, image 57). Unchanged, moderate right pleural effusion with associated atelectasis or consolidation. Musculoskeletal: No chest wall abnormality. No acute osseous findings. CT ABDOMEN PELVIS FINDINGS Hepatobiliary: No solid liver abnormality is seen. Contracted gallbladder. No gallstones, gallbladder wall thickening, or biliary dilatation. Pancreas: Unremarkable. No pancreatic ductal dilatation or surrounding inflammatory changes. Spleen: Normal in size without significant abnormality. Adrenals/Urinary Tract: Adrenal glands are unremarkable. Simple, benign renal cortical cyst of the superior pole of left kidney, for which no further follow-up or characterization is required. Kidneys are other normal, without renal calculi, solid lesion, or hydronephrosis. Bladder is unremarkable. Stomach/Bowel: Stomach is within normal limits. Appendix appears normal. No evidence of bowel wall thickening, distention, or inflammatory changes. Vascular/Lymphatic: Aortic atherosclerosis. No enlarged abdominal or pelvic lymph nodes. Reproductive: No mass or other abnormality. Other: Fat containing bilateral inguinal hernias.  No ascites. Musculoskeletal: No acute osseous findings. IMPRESSION: 1. Unchanged post treatment appearance of the  perihilar right lung with consolidation and fibrosis. 2. Unchanged, moderate right pleural effusion with associated atelectasis or consolidation. 3. No evidence of lymphadenopathy or metastatic disease within the chest, abdomen, or pelvis. 4. Coronary artery disease. Aortic Atherosclerosis (ICD10-I70.0). Electronically Signed   By: ADelanna AhmadiM.D.   On: 03/31/2022 11:00   CT Abdomen Pelvis W Contrast  Result Date: 03/31/2022 CLINICAL DATA:  Primary Cancer Type: Lung Imaging Indication: Assess response to therapy Interval therapy since last imaging? Yes Initial Cancer Diagnosis Date: 08/23/2018; Established by: Biopsy-proven Detailed Pathology: Stage IIIB non-small cell lung cancer, adenocarcinoma. Primary Tumor location:  Right upper lobe. Recurrence? Yes; Date(s) of recurrence: 02/16/2020; Established by: Biopsy-proven Surgeries: Right inguinal hernia repair with mesh 06/24/2020. Chemotherapy: Yes; Ongoing? Yes; daily [name] Immunotherapy?  Yes; Type: Imfinzi; Ongoing? No Radiation therapy? Yes Date Range: 03/06/2019 - 04/13/2019; Target: Right neck Date Range: 09/05/2018 - 10/14/2018; Target: Right lung * Tracking Code: BO * EXAM: CT CHEST, ABDOMEN, AND PELVIS WITH CONTRAST TECHNIQUE: Multidetector CT imaging of the chest, abdomen and pelvis was performed following the standard protocol during bolus administration of intravenous contrast. RADIATION DOSE REDUCTION: This exam was performed according to the departmental dose-optimization program which includes automated exposure control, adjustment of the mA and/or kV according to patient size and/or use of iterative reconstruction technique. CONTRAST:  1059mOMNIPAQUE IOHEXOL 300 MG/ML SOLN, additional oral enteric contrast COMPARISON:  Most recent CT chest, abdomen and pelvis 11/13/2021. 02/02/2020 PET-CT. FINDINGS: CT CHEST FINDINGS Cardiovascular: Scattered aortic  atherosclerosis. Normal heart size. Left coronary artery calcifications. No pericardial  effusion. Mediastinum/Nodes: No enlarged mediastinal, hilar, or axillary lymph nodes. Thyroid gland, trachea, and esophagus demonstrate no significant findings. Lungs/Pleura: Unchanged post treatment appearance of the perihilar right lung with consolidation and fibrosis (series 6, image 57). Unchanged, moderate right pleural effusion with associated atelectasis or consolidation. Musculoskeletal: No chest wall abnormality. No acute osseous findings. CT ABDOMEN PELVIS FINDINGS Hepatobiliary: No solid liver abnormality is seen. Contracted gallbladder. No gallstones, gallbladder wall thickening, or biliary dilatation. Pancreas: Unremarkable. No pancreatic ductal dilatation or surrounding inflammatory changes. Spleen: Normal in size without significant abnormality. Adrenals/Urinary Tract: Adrenal glands are unremarkable. Simple, benign renal cortical cyst of the superior pole of left kidney, for which no further follow-up or characterization is required. Kidneys are other normal, without renal calculi, solid lesion, or hydronephrosis. Bladder is unremarkable. Stomach/Bowel: Stomach is within normal limits. Appendix appears normal. No evidence of bowel wall thickening, distention, or inflammatory changes. Vascular/Lymphatic: Aortic atherosclerosis. No enlarged abdominal or pelvic lymph nodes. Reproductive: No mass or other abnormality. Other: Fat containing bilateral inguinal hernias.  No ascites. Musculoskeletal: No acute osseous findings. IMPRESSION: 1. Unchanged post treatment appearance of the perihilar right lung with consolidation and fibrosis. 2. Unchanged, moderate right pleural effusion with associated atelectasis or consolidation. 3. No evidence of lymphadenopathy or metastatic disease within the chest, abdomen, or pelvis. 4. Coronary artery disease. Aortic Atherosclerosis (ICD10-I70.0). Electronically Signed   By: Delanna Ahmadi M.D.   On: 03/31/2022 11:00     ASSESSMENT AND PLAN: This is a very pleasant 61  years old white male with metastatic non-small cell lung cancer, adenocarcinoma now with ALK gene translocation.  The patient was initially diagnosed as a stage IIIa non-small cell lung cancer status post a course of concurrent chemoradiation with weekly carboplatin and paclitaxel followed by 1 year of consolidation treatment with immunotherapy with Imfinzi. He had evidence for disease progression recently. Repeat tissue biopsy from the left supraclavicular lymph nodes and molecular studies showed that the patient has positive ALK gene translocation.  His previous molecular studies by Guardant 360 was negative. I had a lengthy discussion with the patient today about his condition and treatment options. He received 1 cycle of systemic chemotherapy with carboplatin, Alimta and Keytruda.  We will discontinue his systemic chemotherapy for now because of the new findings on the molecular studies. The patient is currently on treatment with [name] 600 mg p.o. twice daily.  He is status post 25 months of treatment.   He has been tolerating this treatment well with no concerning adverse effect except for mild fatigue. He had repeat CT scan of the chest, abdomen and pelvis performed recently.  I personally and independently reviewed the scan and discussed the result with the patient today. His scan showed no concerning findings for disease progression. I recommended for him to continue his current treatment with [name] ([name]) 600 mg p.o. twice daily. For the anemia, I advised him to take oral iron tablet with vitamin C. For the history of hypothyroidism, he will continue his current treatment with levothyroxine. I will see the patient back for follow-up visit in 2 months for evaluation and repeat blood work. He was advised to call immediately if he has any other concerning symptoms in the interval. The patient voices understanding of current disease status and treatment options and is in agreement with  the current care plan.  All questions were answered. The patient knows to call the clinic with any problems, questions or  concerns. We can certainly see the patient much sooner if necessary.  Disclaimer: This note was dictated with voice recognition software. Similar sounding words can inadvertently be transcribed and may not be corrected upon review.

## 2022-04-13 ENCOUNTER — Telehealth: Payer: Self-pay | Admitting: Internal Medicine

## 2022-04-13 NOTE — Telephone Encounter (Signed)
Scheduled per 08/30 los, patient has been called and notified. 

## 2022-04-21 DIAGNOSIS — H04203 Unspecified epiphora, bilateral lacrimal glands: Secondary | ICD-10-CM | POA: Diagnosis not present

## 2022-05-07 ENCOUNTER — Other Ambulatory Visit: Payer: Self-pay | Admitting: Pulmonary Disease

## 2022-05-11 ENCOUNTER — Other Ambulatory Visit: Payer: Self-pay | Admitting: Internal Medicine

## 2022-05-11 DIAGNOSIS — C3491 Malignant neoplasm of unspecified part of right bronchus or lung: Secondary | ICD-10-CM

## 2022-05-14 DIAGNOSIS — J449 Chronic obstructive pulmonary disease, unspecified: Secondary | ICD-10-CM | POA: Diagnosis not present

## 2022-05-14 DIAGNOSIS — H02135 Senile ectropion of left lower eyelid: Secondary | ICD-10-CM | POA: Diagnosis not present

## 2022-05-14 DIAGNOSIS — H04553 Acquired stenosis of bilateral nasolacrimal duct: Secondary | ICD-10-CM | POA: Diagnosis not present

## 2022-05-14 DIAGNOSIS — H02132 Senile ectropion of right lower eyelid: Secondary | ICD-10-CM | POA: Diagnosis not present

## 2022-05-14 DIAGNOSIS — H02105 Unspecified ectropion of left lower eyelid: Secondary | ICD-10-CM | POA: Diagnosis not present

## 2022-05-14 DIAGNOSIS — H04551 Acquired stenosis of right nasolacrimal duct: Secondary | ICD-10-CM | POA: Diagnosis not present

## 2022-05-14 DIAGNOSIS — H02102 Unspecified ectropion of right lower eyelid: Secondary | ICD-10-CM | POA: Diagnosis not present

## 2022-05-14 DIAGNOSIS — H04552 Acquired stenosis of left nasolacrimal duct: Secondary | ICD-10-CM | POA: Diagnosis not present

## 2022-05-17 ENCOUNTER — Encounter: Payer: Self-pay | Admitting: Internal Medicine

## 2022-05-18 ENCOUNTER — Other Ambulatory Visit: Payer: Self-pay | Admitting: Physician Assistant

## 2022-05-18 DIAGNOSIS — J90 Pleural effusion, not elsewhere classified: Secondary | ICD-10-CM

## 2022-05-19 ENCOUNTER — Ambulatory Visit (HOSPITAL_COMMUNITY)
Admission: RE | Admit: 2022-05-19 | Discharge: 2022-05-19 | Disposition: A | Discharge status: Home / Self Care | Payer: BC Managed Care – PPO | Source: Ambulatory Visit | Attending: Physician Assistant | Admitting: Physician Assistant

## 2022-05-19 DIAGNOSIS — J90 Pleural effusion, not elsewhere classified: Secondary | ICD-10-CM | POA: Insufficient documentation

## 2022-05-19 DIAGNOSIS — R059 Cough, unspecified: Secondary | ICD-10-CM | POA: Diagnosis not present

## 2022-05-19 DIAGNOSIS — R0602 Shortness of breath: Secondary | ICD-10-CM | POA: Diagnosis not present

## 2022-05-31 ENCOUNTER — Telehealth: Payer: Self-pay | Admitting: Internal Medicine

## 2022-05-31 NOTE — Telephone Encounter (Signed)
Rescheduled 10/31 appointment to 11/01 due provider on-call, patient has been called and voicemail was left.

## 2022-06-02 ENCOUNTER — Inpatient Hospital Stay: Payer: BC Managed Care – PPO

## 2022-06-02 ENCOUNTER — Inpatient Hospital Stay: Payer: BC Managed Care – PPO | Admitting: Internal Medicine

## 2022-06-03 ENCOUNTER — Inpatient Hospital Stay (HOSPITAL_BASED_OUTPATIENT_CLINIC_OR_DEPARTMENT_OTHER): Payer: BC Managed Care – PPO | Admitting: Internal Medicine

## 2022-06-03 ENCOUNTER — Other Ambulatory Visit: Payer: Self-pay

## 2022-06-03 ENCOUNTER — Inpatient Hospital Stay: Payer: BC Managed Care – PPO | Attending: Internal Medicine

## 2022-06-03 VITALS — BP 126/82 | HR 81 | Temp 98.0°F | Resp 16 | Ht 68.0 in | Wt 193.4 lb

## 2022-06-03 DIAGNOSIS — C770 Secondary and unspecified malignant neoplasm of lymph nodes of head, face and neck: Secondary | ICD-10-CM | POA: Diagnosis not present

## 2022-06-03 DIAGNOSIS — C3411 Malignant neoplasm of upper lobe, right bronchus or lung: Secondary | ICD-10-CM | POA: Insufficient documentation

## 2022-06-03 DIAGNOSIS — I10 Essential (primary) hypertension: Secondary | ICD-10-CM | POA: Diagnosis not present

## 2022-06-03 DIAGNOSIS — D649 Anemia, unspecified: Secondary | ICD-10-CM | POA: Diagnosis not present

## 2022-06-03 DIAGNOSIS — E039 Hypothyroidism, unspecified: Secondary | ICD-10-CM | POA: Insufficient documentation

## 2022-06-03 DIAGNOSIS — E119 Type 2 diabetes mellitus without complications: Secondary | ICD-10-CM | POA: Insufficient documentation

## 2022-06-03 DIAGNOSIS — C3491 Malignant neoplasm of unspecified part of right bronchus or lung: Secondary | ICD-10-CM

## 2022-06-03 DIAGNOSIS — R519 Headache, unspecified: Secondary | ICD-10-CM | POA: Insufficient documentation

## 2022-06-03 DIAGNOSIS — Z79899 Other long term (current) drug therapy: Secondary | ICD-10-CM | POA: Insufficient documentation

## 2022-06-03 DIAGNOSIS — C3490 Malignant neoplasm of unspecified part of unspecified bronchus or lung: Secondary | ICD-10-CM | POA: Diagnosis not present

## 2022-06-03 LAB — CBC WITH DIFFERENTIAL (CANCER CENTER ONLY)
Abs Immature Granulocytes: 0.09 10*3/uL — ABNORMAL HIGH (ref 0.00–0.07)
Basophils Absolute: 0.1 10*3/uL (ref 0.0–0.1)
Basophils Relative: 1 %
Eosinophils Absolute: 0.3 10*3/uL (ref 0.0–0.5)
Eosinophils Relative: 2 %
HCT: 38.8 % — ABNORMAL LOW (ref 39.0–52.0)
Hemoglobin: 13.2 g/dL (ref 13.0–17.0)
Immature Granulocytes: 1 %
Lymphocytes Relative: 14 %
Lymphs Abs: 1.6 10*3/uL (ref 0.7–4.0)
MCH: 32.1 pg (ref 26.0–34.0)
MCHC: 34 g/dL (ref 30.0–36.0)
MCV: 94.4 fL (ref 80.0–100.0)
Monocytes Absolute: 1.3 10*3/uL — ABNORMAL HIGH (ref 0.1–1.0)
Monocytes Relative: 11 %
Neutro Abs: 8.2 10*3/uL — ABNORMAL HIGH (ref 1.7–7.7)
Neutrophils Relative %: 71 %
Platelet Count: 277 10*3/uL (ref 150–400)
RBC: 4.11 MIL/uL — ABNORMAL LOW (ref 4.22–5.81)
RDW: 15.8 % — ABNORMAL HIGH (ref 11.5–15.5)
WBC Count: 11.5 10*3/uL — ABNORMAL HIGH (ref 4.0–10.5)
nRBC: 0 % (ref 0.0–0.2)

## 2022-06-03 LAB — CMP (CANCER CENTER ONLY)
ALT: 32 U/L (ref 0–44)
AST: 29 U/L (ref 15–41)
Albumin: 4 g/dL (ref 3.5–5.0)
Alkaline Phosphatase: 108 U/L (ref 38–126)
Anion gap: 8 (ref 5–15)
BUN: 16 mg/dL (ref 6–20)
CO2: 27 mmol/L (ref 22–32)
Calcium: 9.3 mg/dL (ref 8.9–10.3)
Chloride: 105 mmol/L (ref 98–111)
Creatinine: 1.06 mg/dL (ref 0.61–1.24)
GFR, Estimated: >60 mL/min (ref >60)
Glucose, Bld: 133 mg/dL — ABNORMAL HIGH (ref 70–99)
Potassium: 3.7 mmol/L (ref 3.5–5.1)
Sodium: 140 mmol/L (ref 135–145)
Total Bilirubin: 1 mg/dL (ref 0.3–1.2)
Total Protein: 6.8 g/dL (ref 6.5–8.1)

## 2022-06-03 NOTE — Progress Notes (Signed)
[place] [phone]   [phone]  OFFICE PROGRESS NOTE  [name], MD [place] [address]  DIAGNOSIS:  Metastatic non-small cell lung cancer initially diagnosed as stage IIIB (T1c, N3, M0) non-small cell lung cancer, adenocarcinoma presented with right upper lobe lung mass in addition to mediastinal and bilateral supraclavicular lymphadenopathy diagnosed in January 2020.  He had evidence of disease recurrence in June 2021 with adenopathy in the subcarinal, level 3 left neck lymph node, and lymph node at the curious of the diaphragm and in the upper abdomen.   PD-L1: 10%   [employer] 360 molecular studies showed no actionable mutation   [facility] Testing:   Biomarker Findings Microsatellite status - MS-Stable Tumor Mutational Burden - 4 Muts/Mb Genomic Findings For a complete list of the genes assayed, please refer to the Appendix. ALK EML4-ALK fusion (Variant 2) SMARCB1 R377C CTNNB1 S45P CDKN2A/B CDKN2B loss, CDKN2A loss CXCR4 C376* EG31 splice site 517O>H 7 Disease relevant genes with no reportable alterations: BRAF, EGFR, ERBB2, KRAS, MET, RET, ROS1  PRIOR THERAPY: 1) Concurrent chemoradiation with weekly carboplatin for AUC of 2 and paclitaxel 45 mg/M2.  Status post 6 cycles.  Last dose was given on October 10, 2018 with stable disease. 2) Radiation treatment to the enlarging right cervical lymph nodes under the care of Dr. Sondra Come. First treatment 03/06/2019. Last treatment scheduled on 04/13/2019 3) Consolidation treatment with immunotherapy with Imfinzi 10 mg/KG every 2 weeks.  First dose November 17, 2018.  Status post 26 cycles. 4) Systemic chemotherapy with carboplatin for an AUC of 5, Alimta 500 mg/m2, and Keytruda 200 mg IV every 3 weeks. First dose expected on 02/14/20. Status post 1 cycle.  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 5) SBRT to the left lower lobe lung nodule.   CURRENT THERAPY:  [name] ([name]) 600 mg p.o. twice daily.  He started the first dose on March 08, 2020.   Status post 25 months of treatment.  INTERVAL HISTORY: Brian [name] 61 y.o. male returns to the clinic today for 2 months follow-up visit.  The patient is feeling fine today with no concerning complaints.  His shortness of breath has improved and he exercises more recently.  He denied having any current chest pain, cough or hemoptysis.  He has no nausea, vomiting, diarrhea or constipation.  He has occasional headache with no visual changes.  He has no recent weight loss or night sweats.  He is here today for evaluation and repeat blood work.  MEDICAL HISTORY: Past Medical History:  Diagnosis Date   COPD (chronic obstructive pulmonary disease) ([place])    Diabetes mellitus without complication ([place])    Hypertension    nscl ca dx'd 08/2018   Pneumonia    Subarachnoid hemorrhage ([facility])     ALLERGIES:  has No Known Allergies.  MEDICATIONS:  Current Outpatient Medications  Medication Sig Dispense Refill   albuterol (VENTOLIN HFA) 108 (90 Base) MCG/ACT inhaler INHALE 2 PUFFS INTO THE LUNGS EVERY 4 (FOUR) HOURS AS NEEDED FOR WHEEZING OR SHORTNESS OF BREATH. 6.7 each 4   [name] 150 MG capsule TAKE 4 CAPSULES BY MOUTH TWICE DAILY WITH FOOD. SWALLOW CAPSULES WHOLE. DO NOT OPEN OR CRUSH. STORE IN ORIGINAL CONTAINER 240 capsule 2   amLODipine (NORVASC) 2.5 MG tablet Take 2.5 mg by mouth daily.     B  Complex Vitamins (B COMPLEX PO) Take 1 tablet by mouth daily.     levothyroxine (SYNTHROID) 100 MCG tablet TAKE 1 TABLET BY MOUTH DAILY BEFORE BREAKFAST. 90 tablet 1   Multiple Vitamin (MULTIVITAMIN) tablet Take 1 tablet by mouth daily.     TRELEGY ELLIPTA 100-62.5-25 MCG/INH AEPB Inhale 1 puff into the lungs daily.     venlafaxine (EFFEXOR) 37.5 MG tablet Take 37.5 mg by mouth daily.      No current  facility-administered medications for this visit.    SURGICAL HISTORY:  Past Surgical History:  Procedure Laterality Date   INGUINAL HERNIA REPAIR Right 06/24/2020   Procedure: RIGHT OPEN INGUINAL HERNIA REPAIR WITH MESH;  Surgeon: Kinsinger, Arta Bruce, MD;  Location: [facility];  Service: General;  Laterality: Right;   IR IVC FILTER PLMT / S&I Burke Keels GUID/MOD SED  03/04/2020   IR IVC FILTER RETRIEVAL / S&I Burke Keels GUID/MOD SED  08/15/2020   IR RADIOLOGIST EVAL & MGMT  08/06/2020   LEG SURGERY  age 49    left leg, from fracture    REVIEW OF SYSTEMS:  A comprehensive review of systems was negative except for: Respiratory: positive for dyspnea on exertion Neurological: positive for headaches   PHYSICAL EXAMINATION: General appearance: alert, cooperative, fatigued, and no distress Head: Normocephalic, without obvious abnormality, atraumatic Neck: no adenopathy, no JVD, supple, symmetrical, trachea midline, and thyroid not enlarged, symmetric, no tenderness/mass/nodules Lymph nodes: Cervical, supraclavicular, and axillary nodes normal. Resp: clear to auscultation bilaterally Back: symmetric, no curvature. ROM normal. No CVA tenderness. Cardio: regular rate and rhythm, S1, S2 normal, no murmur, click, rub or gallop GI: soft, non-tender; bowel sounds normal; no masses,  no organomegaly Extremities: extremities normal, atraumatic, no cyanosis or edema  ECOG PERFORMANCE STATUS: 1 - Symptomatic but completely ambulatory  Blood pressure 126/82, pulse 81, temperature 98 F (36.7 C), temperature source Oral, resp. rate 16, height _0  (1.727 m), weight 193 lb 6.4 oz (87.7 kg), SpO2 97 %.  LABORATORY DATA: Lab Results  Component Value Date   WBC 11.5 (H) 06/03/2022   HGB 13.2 06/03/2022   HCT 38.8 (L) 06/03/2022   MCV 94.4 06/03/2022   PLT 277 06/03/2022      Chemistry      Component Value Date/Time   NA 139 03/30/2022 1333   K 4.0 03/30/2022 1333   CL 107 03/30/2022 1333   CO2 27 03/30/2022  1333   BUN 18 03/30/2022 1333   CREATININE 1.04 03/30/2022 1333      Component Value Date/Time   CALCIUM 9.0 03/30/2022 1333   ALKPHOS 116 03/30/2022 1333   AST 25 03/30/2022 1333   ALT 28 03/30/2022 1333   BILITOT 0.6 03/30/2022 1333       RADIOGRAPHIC STUDIES: DG Chest 2 View  Result Date: 05/19/2022 CLINICAL DATA:  Shortness of breath, cough EXAM: CHEST - 2 VIEW COMPARISON:  12/31/2021 FINDINGS: The heart size and mediastinal contours are within normal limits. Chronic fibrotic and post treatment changes in the right perihilar region and right mid lung. Small right pleural effusion. Left lung is clear. No pneumothorax. The visualized skeletal structures are unremarkable. IMPRESSION: Small right pleural effusion. Chronic post treatment changes in the right perihilar region and right mid lung. Electronically Signed   By: Davina Poke D.O.   On: 05/19/2022 10:52     ASSESSMENT AND PLAN: This is a very pleasant 61 years old white male with metastatic non-small cell lung cancer, adenocarcinoma now with ALK gene translocation.  The  patient was initially diagnosed as a stage IIIa non-small cell lung cancer status post a course of concurrent chemoradiation with weekly carboplatin and paclitaxel followed by 1 year of consolidation treatment with immunotherapy with Imfinzi. He had evidence for disease progression recently. Repeat tissue biopsy from the left supraclavicular lymph nodes and molecular studies showed that the patient has positive ALK gene translocation.  His previous molecular studies by [employer] was negative. I had a lengthy discussion with the patient today about his condition and treatment options. He received 1 cycle of systemic chemotherapy with carboplatin, Alimta and Keytruda.  We will discontinue his systemic chemotherapy for now because of the new findings on the molecular studies. The patient is currently on treatment with [name] 600 mg p.o. twice daily.  He is  status post 25 months of treatment.   The patient has been tolerating this treatment well with no concerning adverse effects. I recommended for him to continue his current treatment with [name] ([name]) with the same dose. I will see him back for follow-up visit in 2 months for evaluation with repeat CT scan of the chest, abdomen and pelvis for restaging of his disease. For the anemia, I advised him to take oral iron tablet with vitamin C. For the history of hypothyroidism, he will continue his current treatment with levothyroxine. He was advised to call immediately if he has any concerning symptoms in the interval. The patient voices understanding of current disease status and treatment options and is in agreement with the current care plan.  All questions were answered. The patient knows to call the clinic with any problems, questions or concerns. We can certainly see the patient much sooner if necessary.  Disclaimer: This note was dictated with voice recognition software. Similar sounding words can inadvertently be transcribed and may not be corrected upon review.

## 2022-06-24 DIAGNOSIS — J320 Chronic maxillary sinusitis: Secondary | ICD-10-CM | POA: Diagnosis not present

## 2022-06-24 DIAGNOSIS — R202 Paresthesia of skin: Secondary | ICD-10-CM | POA: Diagnosis not present

## 2022-06-24 DIAGNOSIS — H109 Unspecified conjunctivitis: Secondary | ICD-10-CM | POA: Diagnosis not present

## 2022-07-06 DIAGNOSIS — R202 Paresthesia of skin: Secondary | ICD-10-CM | POA: Diagnosis not present

## 2022-07-06 DIAGNOSIS — M25511 Pain in right shoulder: Secondary | ICD-10-CM | POA: Diagnosis not present

## 2022-07-06 DIAGNOSIS — G540 Brachial plexus disorders: Secondary | ICD-10-CM | POA: Diagnosis not present

## 2022-07-06 DIAGNOSIS — G8929 Other chronic pain: Secondary | ICD-10-CM | POA: Diagnosis not present

## 2022-07-09 ENCOUNTER — Other Ambulatory Visit (HOSPITAL_COMMUNITY): Payer: Self-pay | Admitting: Orthopedic Surgery

## 2022-07-09 DIAGNOSIS — M5412 Radiculopathy, cervical region: Secondary | ICD-10-CM

## 2022-07-09 DIAGNOSIS — R202 Paresthesia of skin: Secondary | ICD-10-CM

## 2022-07-09 DIAGNOSIS — G540 Brachial plexus disorders: Secondary | ICD-10-CM

## 2022-07-14 DIAGNOSIS — H02834 Dermatochalasis of left upper eyelid: Secondary | ICD-10-CM | POA: Diagnosis not present

## 2022-07-23 ENCOUNTER — Ambulatory Visit (HOSPITAL_COMMUNITY): Payer: BC Managed Care – PPO

## 2022-07-30 ENCOUNTER — Ambulatory Visit (HOSPITAL_COMMUNITY)
Admission: RE | Admit: 2022-07-30 | Discharge: 2022-07-30 | Disposition: A | Discharge status: Home / Self Care | Payer: BC Managed Care – PPO | Source: Ambulatory Visit | Attending: Orthopedic Surgery | Admitting: Orthopedic Surgery

## 2022-07-30 DIAGNOSIS — Z85118 Personal history of other malignant neoplasm of bronchus and lung: Secondary | ICD-10-CM | POA: Diagnosis not present

## 2022-07-30 DIAGNOSIS — R202 Paresthesia of skin: Secondary | ICD-10-CM | POA: Insufficient documentation

## 2022-07-30 DIAGNOSIS — J90 Pleural effusion, not elsewhere classified: Secondary | ICD-10-CM | POA: Diagnosis not present

## 2022-07-30 DIAGNOSIS — G540 Brachial plexus disorders: Secondary | ICD-10-CM | POA: Diagnosis not present

## 2022-07-30 DIAGNOSIS — M5412 Radiculopathy, cervical region: Secondary | ICD-10-CM | POA: Insufficient documentation

## 2022-07-30 DIAGNOSIS — M19011 Primary osteoarthritis, right shoulder: Secondary | ICD-10-CM | POA: Diagnosis not present

## 2022-08-04 ENCOUNTER — Inpatient Hospital Stay: Payer: BC Managed Care – PPO | Attending: Internal Medicine

## 2022-08-04 DIAGNOSIS — J90 Pleural effusion, not elsewhere classified: Secondary | ICD-10-CM | POA: Insufficient documentation

## 2022-08-04 DIAGNOSIS — C770 Secondary and unspecified malignant neoplasm of lymph nodes of head, face and neck: Secondary | ICD-10-CM | POA: Diagnosis not present

## 2022-08-04 DIAGNOSIS — C7931 Secondary malignant neoplasm of brain: Secondary | ICD-10-CM | POA: Insufficient documentation

## 2022-08-04 DIAGNOSIS — E039 Hypothyroidism, unspecified: Secondary | ICD-10-CM | POA: Insufficient documentation

## 2022-08-04 DIAGNOSIS — I10 Essential (primary) hypertension: Secondary | ICD-10-CM | POA: Insufficient documentation

## 2022-08-04 DIAGNOSIS — Z79899 Other long term (current) drug therapy: Secondary | ICD-10-CM | POA: Diagnosis not present

## 2022-08-04 DIAGNOSIS — Z923 Personal history of irradiation: Secondary | ICD-10-CM | POA: Insufficient documentation

## 2022-08-04 DIAGNOSIS — C3490 Malignant neoplasm of unspecified part of unspecified bronchus or lung: Secondary | ICD-10-CM

## 2022-08-04 DIAGNOSIS — E119 Type 2 diabetes mellitus without complications: Secondary | ICD-10-CM | POA: Diagnosis not present

## 2022-08-04 DIAGNOSIS — C3411 Malignant neoplasm of upper lobe, right bronchus or lung: Secondary | ICD-10-CM | POA: Insufficient documentation

## 2022-08-04 LAB — CMP (CANCER CENTER ONLY)
ALT: 26 U/L (ref 0–44)
AST: 25 U/L (ref 15–41)
Albumin: 4.2 g/dL (ref 3.5–5.0)
Alkaline Phosphatase: 112 U/L (ref 38–126)
Anion gap: 7 (ref 5–15)
BUN: 16 mg/dL (ref 6–20)
CO2: 27 mmol/L (ref 22–32)
Calcium: 9.1 mg/dL (ref 8.9–10.3)
Chloride: 104 mmol/L (ref 98–111)
Creatinine: 0.89 mg/dL (ref 0.61–1.24)
GFR, Estimated: >60 mL/min (ref >60)
Glucose, Bld: 82 mg/dL (ref 70–99)
Potassium: 4.1 mmol/L (ref 3.5–5.1)
Sodium: 138 mmol/L (ref 135–145)
Total Bilirubin: 0.8 mg/dL (ref 0.3–1.2)
Total Protein: 6.9 g/dL (ref 6.5–8.1)

## 2022-08-04 LAB — CBC WITH DIFFERENTIAL (CANCER CENTER ONLY)
Abs Immature Granulocytes: 0.11 10*3/uL — ABNORMAL HIGH (ref 0.00–0.07)
Basophils Absolute: 0.1 10*3/uL (ref 0.0–0.1)
Basophils Relative: 1 %
Eosinophils Absolute: 0.3 10*3/uL (ref 0.0–0.5)
Eosinophils Relative: 3 %
HCT: 37.6 % — ABNORMAL LOW (ref 39.0–52.0)
Hemoglobin: 12.8 g/dL — ABNORMAL LOW (ref 13.0–17.0)
Immature Granulocytes: 1 %
Lymphocytes Relative: 15 %
Lymphs Abs: 1.5 10*3/uL (ref 0.7–4.0)
MCH: 32.1 pg (ref 26.0–34.0)
MCHC: 34 g/dL (ref 30.0–36.0)
MCV: 94.2 fL (ref 80.0–100.0)
Monocytes Absolute: 1.4 10*3/uL — ABNORMAL HIGH (ref 0.1–1.0)
Monocytes Relative: 13 %
Neutro Abs: 7.2 10*3/uL (ref 1.7–7.7)
Neutrophils Relative %: 67 %
Platelet Count: 223 10*3/uL (ref 150–400)
RBC: 3.99 MIL/uL — ABNORMAL LOW (ref 4.22–5.81)
RDW: 15.5 % (ref 11.5–15.5)
WBC Count: 10.6 10*3/uL — ABNORMAL HIGH (ref 4.0–10.5)
nRBC: 0 % (ref 0.0–0.2)

## 2022-08-04 NOTE — Progress Notes (Unsigned)
[facility] OFFICE PROGRESS NOTE  [name], MD [address] [place] [address]  DIAGNOSIS: Metastatic non-small cell lung cancer initially diagnosed as stage IIIB (T1c, N3, M0) non-small cell lung cancer, adenocarcinoma presented with right upper lobe lung mass in addition to mediastinal and bilateral supraclavicular lymphadenopathy diagnosed in January 2020.  He had evidence of disease recurrence in June 2021 with adenopathy in the subcarinal, level 3 left neck lymph node, and lymph node at the curious of the diaphragm and in the upper abdomen.   PD-L1: 10%   Guardant 360 molecular studies showed no actionable mutation   [facility] Testing:   Biomarker Findings Microsatellite status - MS-Stable Tumor Mutational Burden - 4 Muts/Mb Genomic Findings For a complete list of the genes assayed, please refer to the Appendix. ALK EML4-ALK fusion (Variant 2) SMARCB1 R377C CTNNB1 S45P CDKN2A/B CDKN2B loss, CDKN2A loss CXCR4 C789* FY10 splice site 175Z>W 7 Disease relevant genes with no reportable alterations: BRAF, EGFR, ERBB2, KRAS, MET, RET, ROS1  PRIOR THERAPY: 1) Concurrent chemoradiation with weekly carboplatin for AUC of 2 and paclitaxel 45 mg/M2.  Status post 6 cycles.  Last dose was given on October 10, 2018 with stable disease. 2) Radiation treatment to the enlarging right cervical lymph nodes under the care of Dr. Sondra Come. First treatment 03/06/2019. Last treatment scheduled on 04/13/2019 3) Consolidation treatment with immunotherapy with Imfinzi 10 mg/KG every 2 weeks.  First dose November 17, 2018.  Status post 26 cycles. 4) Systemic chemotherapy with carboplatin for an AUC of 5, Alimta 500 mg/m2, and Keytruda 200 mg IV every 3 weeks. First dose expected on 02/14/20. Status post 1 cycle.  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 5) SBRT to the left lower lobe lung  nodule.  CURRENT THERAPY: [name] ([name]) 600 mg p.o. twice daily.  He started the first dose on March 08, 2020.   Status post 29 months of treatment.   INTERVAL HISTORY: Brian [name] 62 y.o. male returns to the clinic for a follow up visit. The patient is feeling well today without any concerning complaints. The patient continues to tolerate treatment with Alencensa well without any adverse effects. Denies any fever, chills, night sweats, or weight loss. Denies any chest pain or hemoptysis. He has stable dyspnea on exertion. He has a history of recurrent right pleural effusions which periodically need to be drained via thoracentesis. While his imaging shows moderate effusion, he does not feel that this needs to be drained at this time. He has a mild cough secondary to sinus drainage.  Followed closely by orthopedics for shoulder pain and he recently had a CT performed to assess the area.  Denies any nausea, vomiting, diarrhea, or constipation. Denies any headaches. He has brain MRIs annually for his history of metastatic disease to the brain. He states he is due for an eye exam and feels his vision in both eyes got a little worse. His last brain MRI was in March 2023. He follows with radiation oncology for this. Denies any rashes or skin changes. He recently had a restaging CT scan of the chest, abdomen, and pelvis.  He is here today for evaluation and to review his scan results.   MEDICAL HISTORY: Past Medical History:  Diagnosis Date   COPD (chronic obstructive pulmonary disease) ([facility])    Diabetes mellitus without complication ([facility])    Hypertension  nscl ca dx'd 08/2018   Pneumonia    Subarachnoid hemorrhage ([facility])     ALLERGIES:  has No Known Allergies.  MEDICATIONS:  Current Outpatient Medications  Medication Sig Dispense Refill   albuterol (VENTOLIN HFA) 108 (90 Base) MCG/ACT inhaler INHALE 2 PUFFS INTO THE LUNGS EVERY 4 (FOUR) HOURS AS NEEDED FOR WHEEZING OR SHORTNESS OF  BREATH. 6.7 each 4   [name] 150 MG capsule TAKE 4 CAPSULES BY MOUTH TWICE DAILY WITH FOOD. SWALLOW CAPSULES WHOLE. DO NOT OPEN OR CRUSH. STORE IN ORIGINAL CONTAINER 240 capsule 2   amLODipine (NORVASC) 2.5 MG tablet Take 2.5 mg by mouth daily.     B Complex Vitamins (B COMPLEX PO) Take 1 tablet by mouth daily.     levothyroxine (SYNTHROID) 100 MCG tablet TAKE 1 TABLET BY MOUTH DAILY BEFORE BREAKFAST. 90 tablet 1   Multiple Vitamin (MULTIVITAMIN) tablet Take 1 tablet by mouth daily.     TRELEGY ELLIPTA 100-62.5-25 MCG/INH AEPB Inhale 1 puff into the lungs daily.     venlafaxine (EFFEXOR) 37.5 MG tablet Take 37.5 mg by mouth daily.      No current facility-administered medications for this visit.    SURGICAL HISTORY:  Past Surgical History:  Procedure Laterality Date   INGUINAL HERNIA REPAIR Right 06/24/2020   Procedure: RIGHT OPEN INGUINAL HERNIA REPAIR WITH MESH;  Surgeon: Kinsinger, Arta Bruce, MD;  Location: [facility];  Service: General;  Laterality: Right;   IR IVC FILTER PLMT / S&I Burke Keels GUID/MOD SED  03/04/2020   IR IVC FILTER RETRIEVAL / S&I Burke Keels GUID/MOD SED  08/15/2020   IR RADIOLOGIST EVAL & MGMT  08/06/2020   LEG SURGERY  age 32    left leg, from fracture    REVIEW OF SYSTEMS:   Review of Systems  Constitutional: Negative for appetite change, chills, fatigue, fever and unexpected weight change.  HENT: Negative for mouth sores, nosebleeds, sore throat and trouble swallowing.   Eyes: Negative for eye problems (vision may have gotten worse) and icterus.  Respiratory: Positive for mild cough and stable dyspnea on exertion. Negative for  hemoptysis and wheezing.   Cardiovascular: Negative for chest pain and leg swelling.  Gastrointestinal: Negative for abdominal pain, constipation, diarrhea, nausea and vomiting.  Genitourinary: Negative for bladder incontinence, difficulty urinating, dysuria, frequency and hematuria.   Musculoskeletal: Positive for right shoulder pain. Negative for back  pain, gait problem, neck pain and neck stiffness.  Skin: Negative for itching and rash.  Neurological: Negative for dizziness, extremity weakness, gait problem, headaches, light-headedness and seizures.  Hematological: Negative for adenopathy. Does not bruise/bleed easily.  Psychiatric/Behavioral: Negative for confusion, depression and sleep disturbance. The patient is not nervous/anxious.     PHYSICAL EXAMINATION:  Blood pressure 122/83, pulse 77, temperature 97.7 F (36.5 C), temperature source Temporal, resp. rate 18, weight 193 lb 9.6 oz (87.8 kg), SpO2 99 %.  ECOG PERFORMANCE STATUS: 1  Physical Exam  Constitutional: Oriented to person, place, and time and well-developed, well-nourished, and in no distress.  HENT:  Head: Normocephalic and atraumatic.  Mouth/Throat: Oropharynx is clear and moist. No oropharyngeal exudate.  Eyes: Conjunctivae are normal. Right eye exhibits no discharge. Left eye exhibits no discharge. No scleral icterus.  Neck: Normal range of motion. Neck supple.  Cardiovascular: Normal rate, regular rhythm, normal heart sounds and intact distal pulses.   Pulmonary/Chest: Effort normal. Decreased breath sounds at the right lung base. No respiratory distress. No wheezes. No rales.  Abdominal: Soft. Bowel sounds are normal. Exhibits no distension  and no mass. There is no tenderness.  Musculoskeletal: Normal range of motion. Exhibits no edema.  Lymphadenopathy:    No cervical adenopathy.  Neurological: Alert and oriented to person, place, and time. Exhibits normal muscle tone. Gait normal. Coordination normal.  Skin: Skin is warm and dry. No rash noted. Not diaphoretic. No erythema. No pallor.  Psychiatric: Mood, memory and judgment normal.  Vitals reviewed.  LABORATORY DATA: Lab Results  Component Value Date   WBC 10.6 (H) 08/04/2022   HGB 12.8 (L) 08/04/2022   HCT 37.6 (L) 08/04/2022   MCV 94.2 08/04/2022   PLT 223 08/04/2022      Chemistry       Component Value Date/Time   NA 138 08/04/2022 1021   K 4.1 08/04/2022 1021   CL 104 08/04/2022 1021   CO2 27 08/04/2022 1021   BUN 16 08/04/2022 1021   CREATININE 0.89 08/04/2022 1021      Component Value Date/Time   CALCIUM 9.1 08/04/2022 1021   ALKPHOS 112 08/04/2022 1021   AST 25 08/04/2022 1021   ALT 26 08/04/2022 1021   BILITOT 0.8 08/04/2022 1021       RADIOGRAPHIC STUDIES:  CT Abdomen Pelvis W Contrast  Result Date: 08/06/2022 CLINICAL DATA:  Non-small-cell lung cancer.  * Tracking Code: BO * EXAM: CT ABDOMEN AND PELVIS WITH CONTRAST TECHNIQUE: Multidetector CT imaging of the abdomen and pelvis was performed using the standard protocol following bolus administration of intravenous contrast. RADIATION DOSE REDUCTION: This exam was performed according to the departmental dose-optimization program which includes automated exposure control, adjustment of the mA and/or kV according to patient size and/or use of iterative reconstruction technique. CONTRAST:  123m OMNIPAQUE IOHEXOL 300 MG/ML  SOLN COMPARISON:  CT abdomen pelvis 03/30/2022 and older FINDINGS: Lower chest: Please see separate dictation of CT scan of the chest. Hepatobiliary: Diffuse fatty liver infiltration. No enhancing liver lesion. Patent portal vein. The gallbladder is nondilated. Pancreas: Mild global pancreatic atrophy. Fatty replacement as well towards the uncinate process. No enhancing lesion. Spleen: No splenomegaly.  Preserved enhancement. Adrenals/Urinary Tract: The adrenal glands are unremarkable. Mild right-sided renal atrophy. No enhancing right renal lesion. Upper pole left-sided renal cyst. This has dimensions of 19 mm with Hounsfield unit of 13. Bosniak 1. No recommended imaging follow-up. Unchanged from prior. Preserved contour to the urinary bladder Stomach/Bowel: On this non oral contrast exam large bowel has a normal course and caliber with scattered colonic stool. Normal appendix in the right hemipelvis.  Stomach is nondilated. The small bowel is nondilated. No free air or free fluid. Vascular/Lymphatic: Normal caliber aorta and IVC. Mild atherosclerotic changes along the aorta. No abnormal lymph node enlargement present in the abdomen and pelvis. Reproductive: Prostate is unremarkable. Other: Bilateral fat containing inguinal hernias. No ascites or free air. Musculoskeletal: Scattered degenerative changes of the spine and pelvis. Osteophyte formation seen. There is some central canal narrowing at L4-5 and L5-S1 related to osteophytes and disc bulging. IMPRESSION: Fatty liver infiltration. No developing mass lesion, fluid collection or lymph node enlargement in the abdomen and pelvis. Please see separate dictation of chest CT. Electronically Signed   By: AJill SideM.D.   On: 08/06/2022 11:32   CT Chest W Contrast  Result Date: 08/06/2022 CLINICAL DATA:  Non-small-cell lung cancer. * Tracking Code: BO * EXAM: CT CHEST WITH CONTRAST TECHNIQUE: Multidetector CT imaging of the chest was performed during intravenous contrast administration. RADIATION DOSE REDUCTION: This exam was performed according to the departmental dose-optimization program which  includes automated exposure control, adjustment of the mA and/or kV according to patient size and/or use of iterative reconstruction technique. CONTRAST:  187m OMNIPAQUE IOHEXOL 300 MG/ML  SOLN COMPARISON:  CT 03/30/2022 and older FINDINGS: Cardiovascular: Mild vascular calcifications are seen. Normal caliber thoracic aorta. No significant pericardial effusion. The heart is not enlarged. Mediastinum/Nodes: Atrophic thyroid gland. No specific abnormal lymph node enlargement seen in the axillary region, hyaline or mediastinum. Normal caliber thoracic esophagus. Mild wall thickening along the mid to distal esophagus. This was seen previously. Lungs/Pleura: Left lung is grossly clear. There is some mild lower lobe bronchiectasis. No pleural effusion or pneumothorax.  Punctate left lower lobe lung nodule identified, 1-2 mm axial image 342 on series 505. Unchanged from prior. There is a moderate right-sided pleural effusion with adjacent opacities. Unchanged from prior. There is specific perihilar opacity seen in the right upper lobe with some irregular narrowing along bronchi along the inferior margin of the right upper lobe and middle lobe as well some areas in the anterior margin of the right lower lobe, similar to previous. Upper Abdomen: Fatty liver infiltration seen in the upper abdomen. Adrenal glands are preserved. Please see separate dictation of abdomen and pelvis CT. Musculoskeletal: Curvature and degenerative changes along the spine if there is further concern of osseous metastatic disease, bone scan can be performed as clinically directed. IMPRESSION: Stable appearance with a moderate right-sided pleural effusion and adjacent opacity including focal opacity perihilar on the right. Please see separate dictation of CT of the abdomen and pelvis. Aortic Atherosclerosis (ICD10-I70.0). Electronically Signed   By: AJill SideM.D.   On: 08/06/2022 11:26   MR BRACHIAL PLEXUS W/O CM RT  Result Date: 08/03/2022 CLINICAL DATA:  Right upper extremity paresthesias. History lung cancer. EXAM: MRI BRACHIAL PLEXUS WITHOUT CONTRAST TECHNIQUE: Multiplanar, multiecho pulse sequences of the neck and surrounding structures were obtained without intravenous contrast. The field of view was focused on the rightbrachial plexus from the neural foramina to the axilla. COMPARISON:  PA and lateral chest 05/19/2022. CT chest, abdomen and pelvis 03/30/2022. FINDINGS: Spinal cord Visualized spinal cord demonstrates normal signal. Brachial plexus: Appears normal. No mass impinging on the brachial plexus is identified. No neurogenic tumor is identified. Muscles and tendons Normal signal throughout without evidence of denervation atrophy. Bones No fracture or worrisome lesion is seen. Small  degenerative cyst in the right humeral head is noted. 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 The glenohumeral joint appears normal. Other findings Moderately large right pleural effusion appears increased in size compared to the prior chest CT. IMPRESSION: Normal appearing right brachial plexus. Moderately large right pleural effusion appears increased in size compared to the patient's 03/30/2022 chest CT. Acromioclavicular osteoarthritis. Electronically Signed   By: TInge RiseM.D.   On: 08/03/2022 09:53   MR CERVICAL SPINE WO CONTRAST  Result Date: 08/01/2022 CLINICAL DATA:  Right upper extremity paresthesias; history of radiation therapy for lung cancer EXAM: MRI CERVICAL SPINE WITHOUT CONTRAST TECHNIQUE: Multiplanar, multisequence MR imaging of the cervical spine was performed. No intravenous contrast was administered. COMPARISON:  None Available. FINDINGS: Alignment: Physiologic. Vertebrae: No fracture, evidence of discitis, or bone lesion. Cord: Normal signal and morphology. Posterior Fossa, vertebral arteries, paraspinal tissues: Negative. Disc levels: C2-C3: No significant disc bulge. Mild facet arthropathy. No spinal canal stenosis or neural foraminal narrowing. C3-C4: No significant disc bulge. Left-greater-than-right facet arthropathy. No spinal canal stenosis or neural foraminal narrowing. C4-C5: No significant disc bulge. Left-greater-than-right facet arthropathy. No spinal  canal stenosis or neural foraminal narrowing. C5-C6: Minimal disc bulge with right paracentral and subarticular disc protrusion, which indents the thecal sac but does not deform the spinal cord. Facet and uncovertebral hypertrophy. No spinal canal stenosis. Mild right neural foraminal narrowing. C6-C7: No significant disc bulge. No spinal canal stenosis or neuroforaminal narrowing. C7-T1: No significant disc bulge. No spinal canal stenosis or neuroforaminal narrowing.  IMPRESSION: 1. C5-C6 mild right neural foraminal narrowing. 2. Multilevel facet arthropathy, which can be a cause of pain. Electronically Signed   By: Merilyn Baba M.D.   On: 08/01/2022 19:19     ASSESSMENT/PLAN:  This is a very pleasant 62 year old Caucasian male with recurrent lung cancer initially diagnosed with stage IIIb non-small cell lung cancer, adenocarcinoma.  He presented with a right upper lobe lung mass in addition to mediastinal and bilateral supraclavicular lymphadenopathy.  He was diagnosed in January 2020.  His PDL 1 expression is 10% and he has no actionable mutations.    He underwent a course of concurrent chemoradiation with weekly carboplatin and paclitaxel. He is status post 6 cycles.  He tolerated well except for fatigue and odynophagia.    He completed his 26 cycles of consolidation immunotherapy with Imfinzi 10 mg/kg IV every 2 weeks. He tolerated it well without any adverse side effects except has a very mild skin rash over his right cervical area. He completed radiation to the enlarging right cervical lymph nodes. His last radiation treatment was on 04/13/2019.  He received 1 cycle of systemic chemotherapy with carboplatin, Alimta and Keytruda. Repeat tissue biopsy from the left supraclavicular lymph nodes and molecular studies showed that the patient has positive ALK gene translocation. His previous molecular studies by [employer] was negative. Dr. Julien Nordmann discontinue his systemic chemotherapy for now because of the new findings on the molecular studies.   The patient is currently on treatment with [name] 600 mg p.o. twice daily. He is status post 29 months of treatment.  The patient was seen with Dr. Julien Nordmann today.  Patient recently had a restaging CT scan performed.  Dr. Julien Nordmann personally and independently reviewed the scan and discussed the results with the patient today.  The scan showed no evidence of disease progression. The patient has a stable moderate right  effusion.   Mohamed recommends that he continue on the same treatment at the same dose.  The patient states he is not symptomatic from his effusion at this time and would like to wait to have it drained. He knows he can call the clinic or send mychart message to facilitate thoracentesis if needed.   We will see the patient back for follow-up visit in3 months and repeat blood work.  He will continue taking Synthroid for his hypothyroidism. I will arrange to have his thyroid checked at his next lab visit to ensure his dose is appropriate.   He will continue to follow with orthopedics for shoulder pain.  He will continue to follow with radiation oncology for his history of metastatic disease to the brain. He is due for his next brain MRI and follow up in March 2024.   The patient was advised to call immediately if he has any concerning symptoms in the interval. The patient voices understanding of current disease status and treatment options and is in agreement with the current care plan. All questions were answered. The patient knows to call the clinic with any problems, questions or concerns. We can certainly see the patient much sooner if necessary  Orders Placed This Encounter  Procedures   CBC with Differential ([facility] Only)    Standing Status:   Future    Standing Expiration Date:   08/07/2023   CMP ([facility] only)    Standing Status:   Future    Standing Expiration Date:   08/07/2023   TSH    Standing Status:   Future    Standing Expiration Date:   08/07/2023      Marshae Azam L Linah Klapper, PA-C 08/06/22  ADDENDUM: Hematology/Oncology Attending: I had a face-to-face encounter with the patient today.  I reviewed his record, lab, scan and recommended his care plan.  This is a very pleasant 62 years old white male with metastatic non-small cell lung cancer, adenocarcinoma with positive ALK gene translocation that was initially diagnosed as stage IIIb in January 2020.   He has PD-L1 expression of 10%.  The patient was treated with a course of concurrent chemoradiation followed by consolidation treatment with immunotherapy.  He had evidence for disease progression and received 1 cycle of systemic chemotherapy with carboplatin, Alimta and Keytruda but the molecular studies by by repeat tissue biopsy from supraclavicular lymph node showed positive ALK gene translocation and his treatment was switched to targeted therapy with [name] ([name]) 600 mg p.o. twice daily status post 29 months of treatment.  The patient has been tolerating this treatment well with no concerning adverse effects. He had repeat CT scan of the chest, abdomen and pelvis performed recently.  I personally and independently reviewed the scan images and discussed the result with the patient today. His scan showed no concerning findings for disease progression but he continues to have moderate size right pleural effusion.  The patient is currently asymptomatic. I recommended for him to continue his current treatment with [name] ([name]) with the same dose. For the right pleural effusion, we will consider him for ultrasound-guided right thoracentesis if he becomes symptomatic with worsening dyspnea. He will come back for follow-up visit in 2 months for evaluation and repeat blood work. The patient was advised to call immediately if he has any other concerning symptoms in the interval. The total time spent in the appointment was 30 minutes. Disclaimer: This note was dictated with voice recognition software. Similar sounding words can inadvertently be transcribed and may be missed upon review. Eilleen Kempf, MD

## 2022-08-05 ENCOUNTER — Encounter (HOSPITAL_COMMUNITY): Payer: Self-pay

## 2022-08-05 ENCOUNTER — Ambulatory Visit (HOSPITAL_COMMUNITY)
Admission: RE | Admit: 2022-08-05 | Discharge: 2022-08-05 | Disposition: A | Discharge status: Home / Self Care | Payer: BC Managed Care – PPO | Source: Ambulatory Visit | Attending: Internal Medicine | Admitting: Internal Medicine

## 2022-08-05 DIAGNOSIS — C3490 Malignant neoplasm of unspecified part of unspecified bronchus or lung: Secondary | ICD-10-CM | POA: Diagnosis not present

## 2022-08-05 MED ORDER — IOHEXOL 300 MG/ML  SOLN
100.0000 mL | Freq: Once | INTRAMUSCULAR | Status: AC | PRN
  Administered 2022-08-05: 100 mL via INTRAVENOUS

## 2022-08-05 MED ORDER — SODIUM CHLORIDE (PF) 0.9 % IJ SOLN
INTRAMUSCULAR | Status: AC
  Filled 2022-08-05: qty 50

## 2022-08-06 ENCOUNTER — Inpatient Hospital Stay (HOSPITAL_BASED_OUTPATIENT_CLINIC_OR_DEPARTMENT_OTHER): Payer: BC Managed Care – PPO | Admitting: Physician Assistant

## 2022-08-06 VITALS — BP 122/83 | HR 77 | Temp 97.7°F | Resp 18 | Wt 193.6 lb

## 2022-08-06 DIAGNOSIS — Z79899 Other long term (current) drug therapy: Secondary | ICD-10-CM | POA: Diagnosis not present

## 2022-08-06 DIAGNOSIS — E119 Type 2 diabetes mellitus without complications: Secondary | ICD-10-CM | POA: Diagnosis not present

## 2022-08-06 DIAGNOSIS — C770 Secondary and unspecified malignant neoplasm of lymph nodes of head, face and neck: Secondary | ICD-10-CM | POA: Diagnosis not present

## 2022-08-06 DIAGNOSIS — J90 Pleural effusion, not elsewhere classified: Secondary | ICD-10-CM | POA: Diagnosis not present

## 2022-08-06 DIAGNOSIS — C7931 Secondary malignant neoplasm of brain: Secondary | ICD-10-CM | POA: Diagnosis not present

## 2022-08-06 DIAGNOSIS — Z923 Personal history of irradiation: Secondary | ICD-10-CM | POA: Diagnosis not present

## 2022-08-06 DIAGNOSIS — C3491 Malignant neoplasm of unspecified part of right bronchus or lung: Secondary | ICD-10-CM | POA: Diagnosis not present

## 2022-08-06 DIAGNOSIS — C3411 Malignant neoplasm of upper lobe, right bronchus or lung: Secondary | ICD-10-CM | POA: Diagnosis not present

## 2022-08-06 DIAGNOSIS — E039 Hypothyroidism, unspecified: Secondary | ICD-10-CM | POA: Diagnosis not present

## 2022-08-06 DIAGNOSIS — I10 Essential (primary) hypertension: Secondary | ICD-10-CM | POA: Diagnosis not present

## 2022-08-10 ENCOUNTER — Encounter: Payer: Self-pay | Admitting: Internal Medicine

## 2022-08-10 ENCOUNTER — Encounter: Payer: Self-pay | Admitting: Physician Assistant

## 2022-08-10 ENCOUNTER — Other Ambulatory Visit: Payer: Self-pay | Admitting: Physician Assistant

## 2022-08-10 DIAGNOSIS — C3491 Malignant neoplasm of unspecified part of right bronchus or lung: Secondary | ICD-10-CM

## 2022-08-10 DIAGNOSIS — J90 Pleural effusion, not elsewhere classified: Secondary | ICD-10-CM

## 2022-08-11 DIAGNOSIS — M5412 Radiculopathy, cervical region: Secondary | ICD-10-CM | POA: Diagnosis not present

## 2022-08-17 ENCOUNTER — Ambulatory Visit (HOSPITAL_COMMUNITY)
Admission: RE | Admit: 2022-08-17 | Discharge: 2022-08-17 | Disposition: A | Discharge status: Home / Self Care | Payer: BC Managed Care – PPO | Source: Ambulatory Visit | Attending: Radiology | Admitting: Radiology

## 2022-08-17 ENCOUNTER — Ambulatory Visit (HOSPITAL_COMMUNITY)
Admission: RE | Admit: 2022-08-17 | Discharge: 2022-08-17 | Disposition: A | Discharge status: Home / Self Care | Payer: BC Managed Care – PPO | Source: Ambulatory Visit | Attending: Physician Assistant | Admitting: Physician Assistant

## 2022-08-17 VITALS — BP 120/80

## 2022-08-17 DIAGNOSIS — C3490 Malignant neoplasm of unspecified part of unspecified bronchus or lung: Secondary | ICD-10-CM | POA: Diagnosis not present

## 2022-08-17 DIAGNOSIS — C3491 Malignant neoplasm of unspecified part of right bronchus or lung: Secondary | ICD-10-CM | POA: Diagnosis not present

## 2022-08-17 DIAGNOSIS — J90 Pleural effusion, not elsewhere classified: Secondary | ICD-10-CM | POA: Insufficient documentation

## 2022-08-17 DIAGNOSIS — Z9889 Other specified postprocedural states: Secondary | ICD-10-CM | POA: Diagnosis not present

## 2022-08-17 MED ORDER — LIDOCAINE HCL 1 % IJ SOLN
INTRAMUSCULAR | Status: AC
  Administered 2022-08-17: 15 mL
  Filled 2022-08-17: qty 20

## 2022-08-17 NOTE — Procedures (Signed)
Ultrasound-guided therapeutic right thoracentesis performed yielding 1.1 liters of yellow fluid. No immediate complications. Follow-up chest x-ray pending. EBL< 2 cc.

## 2022-08-19 ENCOUNTER — Other Ambulatory Visit: Payer: Self-pay | Admitting: Internal Medicine

## 2022-08-19 DIAGNOSIS — C3491 Malignant neoplasm of unspecified part of right bronchus or lung: Secondary | ICD-10-CM

## 2022-08-20 IMAGING — US US THORACENTESIS ASP PLEURAL SPACE W/IMG GUIDE
1 series · 2 of 2 positions shown · non-contrast
Comparison: none

INDICATION: Patient with history of stage IV right lung adenocarcinoma, dyspnea,
recurrent right pleural effusion. Request received for therapeutic
right thoracentesis.

[Series 1: us thoracentesis asp pleural space w/img guide · 2 of 2 slices shown]
[im 1/2]
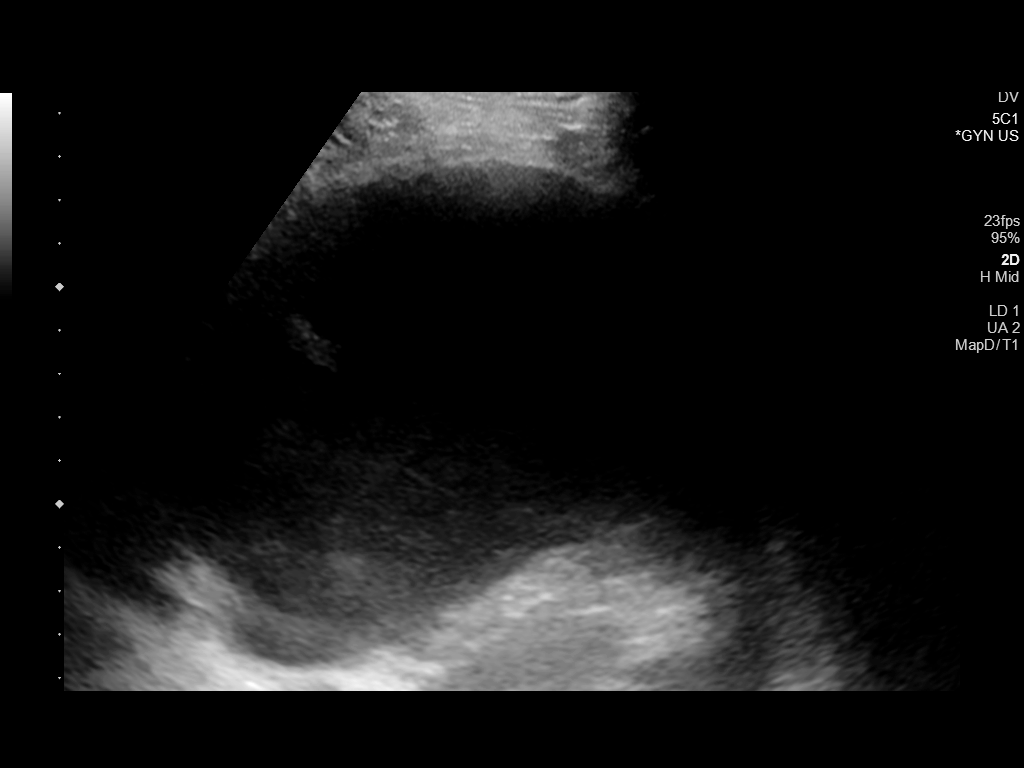
[im 2/2]
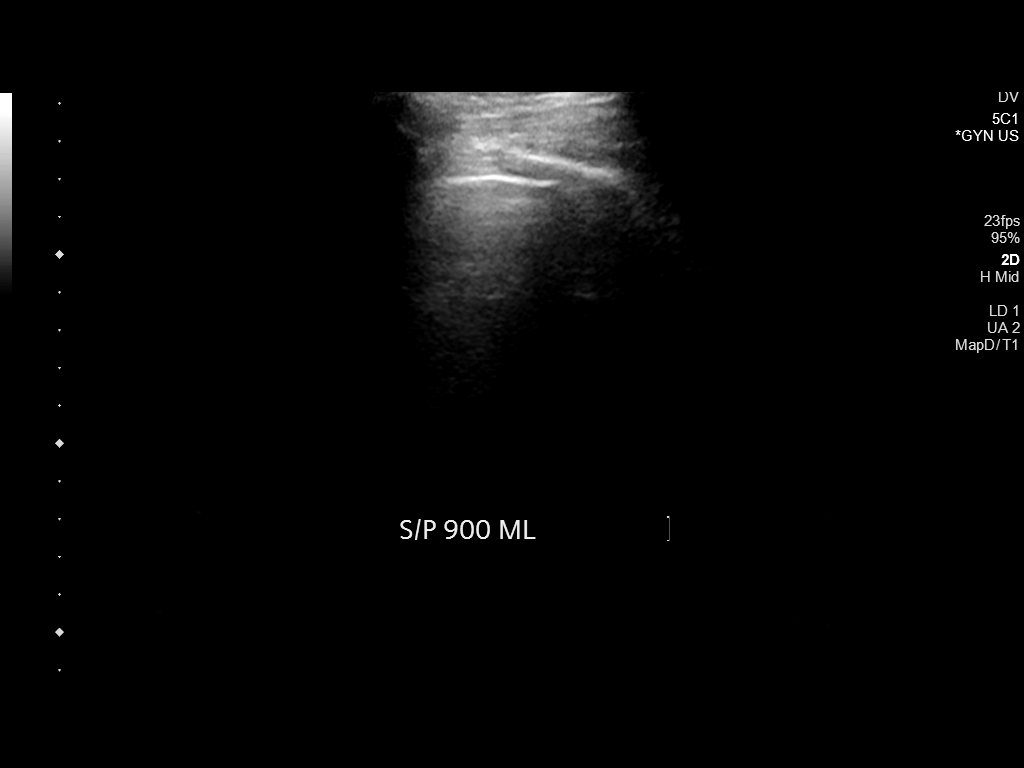

[2 of 2 positions shown; findings below may reference images not displayed]

EXAM:
ULTRASOUND GUIDED THERAPEUTIC RIGHT THORACENTESIS

MEDICATIONS:
1% lidocaine to skin and subcutaneous tissue

COMPLICATIONS:
None immediate.

PROCEDURE:
An ultrasound guided thoracentesis was thoroughly discussed with the
patient and questions answered. The benefits, risks, alternatives
and complications were also discussed. The patient understands and
wishes to proceed with the procedure. Written consent was obtained.

Ultrasound was performed to localize and mark an adequate pocket of
fluid in the right chest. The area was then prepped and draped in
the normal sterile fashion. 1% Lidocaine was used for local
anesthesia. Under ultrasound guidance a 6 Fr Safe-T-Centesis
catheter was introduced. Thoracentesis was performed. The catheter
was removed and a dressing applied.
FINDINGS: A total of approximately 900 cc of amber fluid was removed.
IMPRESSION: Successful ultrasound guided therapeutic right thoracentesis
yielding 900 cc of pleural fluid.

## 2022-08-20 IMAGING — DX DG CHEST 1V
1 series · 1 of 1 positions shown · non-contrast
Comparison: CT February 22, 2020, chest x-ray 03/21/2020 and chest
ultrasound for thoracentesis on 05/13/2020

CLINICAL DATA: Status post RIGHT thoracentesis

EXAM:
CHEST  1 VIEW

[chest pa]
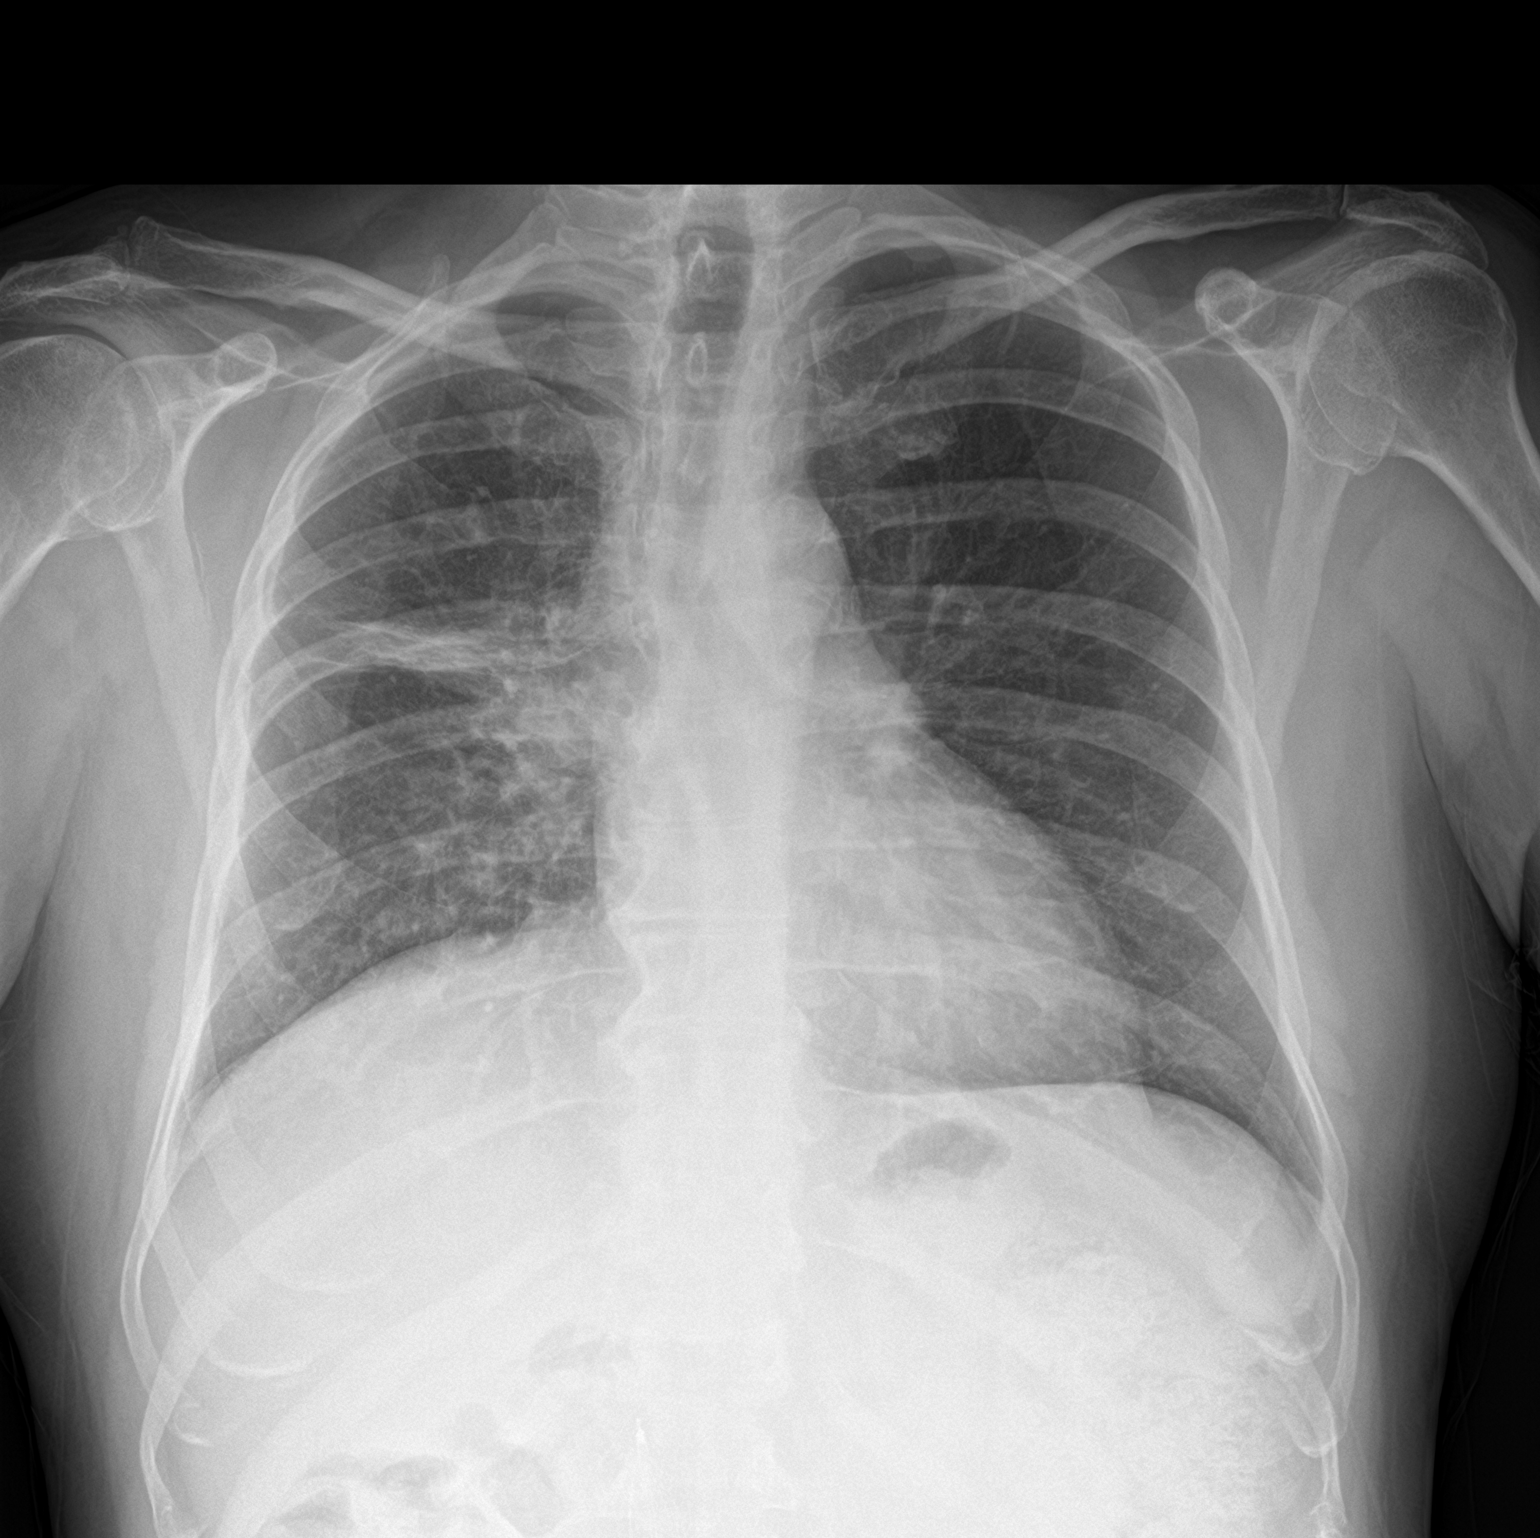

[1 of 1 positions shown; findings below may reference images not displayed]

FINDINGS: Cardiomediastinal contours and hilar structures are stable compared
to the March 21, 2020 evaluation. RIGHT upper lobe findings not as
well seen as on the recent CT.

No sign of pleural effusion following thoracentesis. No
pneumothorax. LEFT chest is clear.

Bandlike opacity extends to the periphery of the RIGHT chest as
before.

Limited assessment of skeletal structures without acute process.
IMPRESSION: 1. No pneumothorax or sign of pleural effusion following
thoracentesis.
2. RIGHT upper lobe findings not as well seen as on the recent CT.

## 2022-08-21 ENCOUNTER — Other Ambulatory Visit: Payer: Self-pay | Admitting: Radiation Therapy

## 2022-08-21 DIAGNOSIS — C3491 Malignant neoplasm of unspecified part of right bronchus or lung: Secondary | ICD-10-CM

## 2022-08-27 ENCOUNTER — Other Ambulatory Visit: Payer: Self-pay | Admitting: Radiation Therapy

## 2022-08-28 ENCOUNTER — Other Ambulatory Visit: Payer: Self-pay

## 2022-09-23 DIAGNOSIS — M5412 Radiculopathy, cervical region: Secondary | ICD-10-CM | POA: Diagnosis not present

## 2022-10-05 ENCOUNTER — Other Ambulatory Visit: Payer: Self-pay

## 2022-10-05 ENCOUNTER — Inpatient Hospital Stay (HOSPITAL_BASED_OUTPATIENT_CLINIC_OR_DEPARTMENT_OTHER): Payer: BC Managed Care – PPO | Admitting: Internal Medicine

## 2022-10-05 ENCOUNTER — Inpatient Hospital Stay: Payer: BC Managed Care – PPO | Attending: Internal Medicine

## 2022-10-05 VITALS — BP 122/81 | HR 77 | Temp 98.3°F | Resp 16 | Wt 196.5 lb

## 2022-10-05 DIAGNOSIS — C7931 Secondary malignant neoplasm of brain: Secondary | ICD-10-CM | POA: Insufficient documentation

## 2022-10-05 DIAGNOSIS — Z79899 Other long term (current) drug therapy: Secondary | ICD-10-CM | POA: Insufficient documentation

## 2022-10-05 DIAGNOSIS — I10 Essential (primary) hypertension: Secondary | ICD-10-CM | POA: Insufficient documentation

## 2022-10-05 DIAGNOSIS — C3411 Malignant neoplasm of upper lobe, right bronchus or lung: Secondary | ICD-10-CM | POA: Diagnosis not present

## 2022-10-05 DIAGNOSIS — C3490 Malignant neoplasm of unspecified part of unspecified bronchus or lung: Secondary | ICD-10-CM | POA: Diagnosis not present

## 2022-10-05 DIAGNOSIS — E1165 Type 2 diabetes mellitus with hyperglycemia: Secondary | ICD-10-CM | POA: Insufficient documentation

## 2022-10-05 DIAGNOSIS — C770 Secondary and unspecified malignant neoplasm of lymph nodes of head, face and neck: Secondary | ICD-10-CM | POA: Diagnosis not present

## 2022-10-05 DIAGNOSIS — D649 Anemia, unspecified: Secondary | ICD-10-CM | POA: Insufficient documentation

## 2022-10-05 DIAGNOSIS — C3491 Malignant neoplasm of unspecified part of right bronchus or lung: Secondary | ICD-10-CM

## 2022-10-05 LAB — CBC WITH DIFFERENTIAL (CANCER CENTER ONLY)
Abs Immature Granulocytes: 0.1 10*3/uL — ABNORMAL HIGH (ref 0.00–0.07)
Basophils Absolute: 0.1 10*3/uL (ref 0.0–0.1)
Basophils Relative: 1 %
Eosinophils Absolute: 0.3 10*3/uL (ref 0.0–0.5)
Eosinophils Relative: 3 %
HCT: 37.4 % — ABNORMAL LOW (ref 39.0–52.0)
Hemoglobin: 13 g/dL (ref 13.0–17.0)
Immature Granulocytes: 1 %
Lymphocytes Relative: 14 %
Lymphs Abs: 1.6 10*3/uL (ref 0.7–4.0)
MCH: 32.3 pg (ref 26.0–34.0)
MCHC: 34.8 g/dL (ref 30.0–36.0)
MCV: 92.8 fL (ref 80.0–100.0)
Monocytes Absolute: 1.6 10*3/uL — ABNORMAL HIGH (ref 0.1–1.0)
Monocytes Relative: 14 %
Neutro Abs: 8 10*3/uL — ABNORMAL HIGH (ref 1.7–7.7)
Neutrophils Relative %: 67 %
Platelet Count: 250 10*3/uL (ref 150–400)
RBC: 4.03 MIL/uL — ABNORMAL LOW (ref 4.22–5.81)
RDW: 15.2 % (ref 11.5–15.5)
WBC Count: 11.7 10*3/uL — ABNORMAL HIGH (ref 4.0–10.5)
nRBC: 0 % (ref 0.0–0.2)

## 2022-10-05 LAB — CMP (CANCER CENTER ONLY)
ALT: 34 U/L (ref 0–44)
AST: 28 U/L (ref 15–41)
Albumin: 4.2 g/dL (ref 3.5–5.0)
Alkaline Phosphatase: 108 U/L (ref 38–126)
Anion gap: 5 (ref 5–15)
BUN: 17 mg/dL (ref 6–20)
CO2: 30 mmol/L (ref 22–32)
Calcium: 9.1 mg/dL (ref 8.9–10.3)
Chloride: 103 mmol/L (ref 98–111)
Creatinine: 1 mg/dL (ref 0.61–1.24)
GFR, Estimated: >60 mL/min (ref >60)
Glucose, Bld: 110 mg/dL — ABNORMAL HIGH (ref 70–99)
Potassium: 4.2 mmol/L (ref 3.5–5.1)
Sodium: 138 mmol/L (ref 135–145)
Total Bilirubin: 0.6 mg/dL (ref 0.3–1.2)
Total Protein: 6.6 g/dL (ref 6.5–8.1)

## 2022-10-05 NOTE — Progress Notes (Signed)
[place] [phone]   [phone]  OFFICE PROGRESS NOTE  [name], MD [place] [address]  DIAGNOSIS:  Metastatic non-small cell lung cancer initially diagnosed as stage IIIB (T1c, N3, M0) non-small cell lung cancer, adenocarcinoma presented with right upper lobe lung mass in addition to mediastinal and bilateral supraclavicular lymphadenopathy diagnosed in January 2020.  He had evidence of disease recurrence in June 2021 with adenopathy in the subcarinal, level 3 left neck lymph node, and lymph node at the curious of the diaphragm and in the upper abdomen.   PD-L1: 10%   Guardant 360 molecular studies showed no actionable mutation   Foundation One Testing:   Biomarker Findings Microsatellite status - MS-Stable Tumor Mutational Burden - 4 Muts/Mb Genomic Findings For a complete list of the genes assayed, please refer to the Appendix. ALK EML4-ALK fusion (Variant 2) SMARCB1 R377C CTNNB1 S45P CDKN2A/B CDKN2B loss, CDKN2A loss CXCR4 [id number]* [id number] splice site [id number] Disease relevant genes with no reportable alterations: BRAF, EGFR, ERBB2, KRAS, MET, RET, ROS1  PRIOR THERAPY: 1) Concurrent chemoradiation with weekly carboplatin for AUC of 2 and paclitaxel 45 mg/M2.  Status post 6 cycles.  Last dose was given on October 10, 2018 with stable disease. 2) Radiation treatment to the enlarging right cervical lymph nodes under the care of Dr. Sondra Come. First treatment 03/06/2019. Last treatment scheduled on 04/13/2019 3) Consolidation treatment with immunotherapy with Imfinzi 10 mg/KG every 2 weeks.  First dose November 17, 2018.  Status post 26 cycles. 4) Systemic chemotherapy with carboplatin for an AUC of 5, Alimta 500 mg/m2, and Keytruda 200 mg IV every 3 weeks. First dose expected on 02/14/20. Status post 1 cycle.  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 5) SBRT to the left lower lobe lung nodule.   CURRENT THERAPY:  [name] ([name]) 600 mg p.o. twice daily.  He started the first dose on March 08, 2020.   Status post 31 months of treatment.  INTERVAL HISTORY: Brian [name] 62 y.o. male turns to the clinic today for follow-up visit.  The patient is feeling fine today with no concerning complaints except for increased swelling in the neck area.  He had ultrasound-guided right thoracentesis several weeks ago with drainage of more than 1 L of right pleural fluid and he felt a little bit better.  He is currently on oral iron tablets and tolerating it fairly well.  He has more energy than before.  He denied having any current chest pain, shortness of breath, cough or hemoptysis.  He has no nausea, vomiting, diarrhea or constipation.  He has no headache or visual changes.  He continues to tolerate his treatment with [name] ([name]) fairly well.  He is here today for evaluation and repeat blood work  MEDICAL HISTORY: Past Medical History:  Diagnosis Date   COPD (chronic obstructive pulmonary disease) ([place])    Diabetes mellitus without complication ([place])    Hypertension    nscl ca dx'd 08/2018   Pneumonia    Subarachnoid hemorrhage ([place])     ALLERGIES:  has No Known Allergies.  MEDICATIONS:  Current Outpatient Medications  Medication Sig Dispense Refill   albuterol (VENTOLIN HFA) 108 (90 Base) MCG/ACT inhaler INHALE 2 PUFFS INTO THE LUNGS EVERY 4 (FOUR) HOURS AS NEEDED FOR WHEEZING OR SHORTNESS OF BREATH. 6.7 each 4   [name]  150 MG capsule TAKE 4 CAPSULES BY MOUTH TWICE DAILY WITH FOOD. SWALLOW CAPSULES WHOLE. DO NOT OPEN OR CRUSH. STORE IN ORIGINAL CONTAINER 240 capsule 2   amLODipine (NORVASC) 2.5 MG tablet Take 2.5 mg by mouth daily.     B Complex Vitamins (B COMPLEX PO) Take 1 tablet by mouth daily.     levothyroxine (SYNTHROID) 100 MCG tablet TAKE 1 TABLET BY MOUTH DAILY BEFORE BREAKFAST. 90 tablet 1    Multiple Vitamin (MULTIVITAMIN) tablet Take 1 tablet by mouth daily.     TRELEGY ELLIPTA 100-62.5-25 MCG/INH AEPB Inhale 1 puff into the lungs daily.     venlafaxine (EFFEXOR) 37.5 MG tablet Take 37.5 mg by mouth daily.      No current facility-administered medications for this visit.    SURGICAL HISTORY:  Past Surgical History:  Procedure Laterality Date   INGUINAL HERNIA REPAIR Right 06/24/2020   Procedure: RIGHT OPEN INGUINAL HERNIA REPAIR WITH MESH;  Surgeon: Kinsinger, Arta Bruce, MD;  Location: [facility];  Service: General;  Laterality: Right;   IR IVC FILTER PLMT / S&I Burke Keels GUID/MOD SED  03/04/2020   IR IVC FILTER RETRIEVAL / S&I Burke Keels GUID/MOD SED  08/15/2020   IR RADIOLOGIST EVAL & MGMT  08/06/2020   LEG SURGERY  age 77    left leg, from fracture    REVIEW OF SYSTEMS:  A comprehensive review of systems was negative except for: Respiratory: positive for dyspnea on exertion   PHYSICAL EXAMINATION: General appearance: alert, cooperative, fatigued, and no distress Head: Normocephalic, without obvious abnormality, atraumatic Neck: no JVD, supple, symmetrical, trachea midline, and thyroid: enlarged Lymph nodes: Cervical, supraclavicular, and axillary nodes normal. Resp: clear to auscultation bilaterally Back: symmetric, no curvature. ROM normal. No CVA tenderness. Cardio: regular rate and rhythm, S1, S2 normal, no murmur, click, rub or gallop GI: soft, non-tender; bowel sounds normal; no masses,  no organomegaly Extremities: extremities normal, atraumatic, no cyanosis or edema  ECOG PERFORMANCE STATUS: 1 - Symptomatic but completely ambulatory  Blood pressure 122/81, pulse 77, temperature 98.3 F (36.8 C), temperature source Oral, resp. rate 16, weight 196 lb 8 oz (89.1 kg), SpO2 98 %.  LABORATORY DATA: Lab Results  Component Value Date   WBC 11.7 (H) 10/05/2022   HGB 13.0 10/05/2022   HCT 37.4 (L) 10/05/2022   MCV 92.8 10/05/2022   PLT 250 10/05/2022      Chemistry       Component Value Date/Time   NA 138 08/04/2022 1021   K 4.1 08/04/2022 1021   CL 104 08/04/2022 1021   CO2 27 08/04/2022 1021   BUN 16 08/04/2022 1021   CREATININE 0.89 08/04/2022 1021      Component Value Date/Time   CALCIUM 9.1 08/04/2022 1021   ALKPHOS 112 08/04/2022 1021   AST 25 08/04/2022 1021   ALT 26 08/04/2022 1021   BILITOT 0.8 08/04/2022 1021       RADIOGRAPHIC STUDIES: No results found.   ASSESSMENT AND PLAN: This is a very pleasant 62 years old white male with metastatic non-small cell lung cancer, adenocarcinoma now with ALK gene translocation.  The patient was initially diagnosed as a stage IIIa non-small cell lung cancer status post a course of concurrent chemoradiation with weekly carboplatin and paclitaxel followed by 1 year of consolidation treatment with immunotherapy with Imfinzi. He had evidence for disease progression recently. Repeat tissue biopsy from the left supraclavicular lymph nodes and molecular studies showed that the patient has positive ALK gene translocation.  His previous  molecular studies by Guardant 360 was negative. He received 1 cycle of systemic chemotherapy with carboplatin, Alimta and Keytruda.  We will discontinue his systemic chemotherapy for now because of the new findings on the molecular studies. The patient is currently on treatment with [name] 600 mg p.o. twice daily.  He is status post 31 months of treatment.   Patient has been tolerating this treatment fairly well with no concerning adverse effects. His CBC today was unremarkable except for slightly elevated white blood count.  His anemia is better.  Comprehensive metabolic panel is unremarkable except for elevated blood sugar of 110.  TSH is still pending. The patient has enlargement of the submandibular area questionable for enlargement thyroid gland versus edema from the previous radiation. I recommended for him to have CT scan of the neck with contrast to rule out any metastatic  disease in this area. I will also consider referring the patient to endocrinology if his TSH is significantly elevated. For the anemia, he will continue on the oral iron tablet with vitamin C. I will see him back for follow-up visit in 2 months for evaluation with repeat CT scan of the chest, abdomen and pelvis for restaging of his disease. The patient was advised to call immediately if he has any concerning symptoms in the interval.   The patient voices understanding of current disease status and treatment options and is in agreement with the current care plan.  All questions were answered. The patient knows to call the clinic with any problems, questions or concerns. We can certainly see the patient much sooner if necessary.  Disclaimer: This note was dictated with voice recognition software. Similar sounding words can inadvertently be transcribed and may not be corrected upon review.

## 2022-10-06 LAB — TSH: TSH: 5.578 u[IU]/mL — ABNORMAL HIGH (ref 0.350–4.500)

## 2022-10-07 ENCOUNTER — Other Ambulatory Visit: Payer: Self-pay

## 2022-10-15 ENCOUNTER — Other Ambulatory Visit: Payer: BC Managed Care – PPO

## 2022-10-15 ENCOUNTER — Ambulatory Visit (HOSPITAL_COMMUNITY)
Admission: RE | Admit: 2022-10-15 | Discharge: 2022-10-15 | Disposition: A | Discharge status: Home / Self Care | Payer: BC Managed Care – PPO | Source: Ambulatory Visit | Attending: Radiation Oncology | Admitting: Radiation Oncology

## 2022-10-15 DIAGNOSIS — C3490 Malignant neoplasm of unspecified part of unspecified bronchus or lung: Secondary | ICD-10-CM | POA: Diagnosis not present

## 2022-10-15 DIAGNOSIS — C3491 Malignant neoplasm of unspecified part of right bronchus or lung: Secondary | ICD-10-CM | POA: Diagnosis not present

## 2022-10-15 MED ORDER — GADOBUTROL 1 MMOL/ML IV SOLN
8.5000 mL | Freq: Once | INTRAVENOUS | Status: AC | PRN
  Administered 2022-10-15: 8.5 mL via INTRAVENOUS

## 2022-10-19 ENCOUNTER — Telehealth: Payer: Self-pay | Admitting: Physician Assistant

## 2022-10-19 ENCOUNTER — Inpatient Hospital Stay: Payer: BC Managed Care – PPO

## 2022-10-19 ENCOUNTER — Ambulatory Visit
Admission: RE | Admit: 2022-10-19 | Discharge: 2022-10-19 | Disposition: A | Discharge status: Home / Self Care | Payer: BC Managed Care – PPO | Source: Ambulatory Visit | Attending: Radiation Oncology | Admitting: Radiation Oncology

## 2022-10-19 ENCOUNTER — Encounter: Payer: Self-pay | Admitting: Radiation Oncology

## 2022-10-19 DIAGNOSIS — C7931 Secondary malignant neoplasm of brain: Secondary | ICD-10-CM | POA: Diagnosis not present

## 2022-10-19 DIAGNOSIS — C778 Secondary and unspecified malignant neoplasm of lymph nodes of multiple regions: Secondary | ICD-10-CM | POA: Diagnosis not present

## 2022-10-19 DIAGNOSIS — C3411 Malignant neoplasm of upper lobe, right bronchus or lung: Secondary | ICD-10-CM | POA: Diagnosis not present

## 2022-10-19 DIAGNOSIS — C3491 Malignant neoplasm of unspecified part of right bronchus or lung: Secondary | ICD-10-CM

## 2022-10-19 NOTE — Progress Notes (Addendum)
Radiation Oncology         [phone] ________________________________   Outpatient Follow Up - Conducted via telephone at patient request.  I spoke with the patient to conduct this visit via telephone. The patient was notified in advance and was offered an in person or telemedicine meeting to allow for face to face communication but instead preferred to proceed with a telephone visit.   Name: Brian [name]        MRN: [id number]  Date of Service: 10/19/2022 DOB: 1960-09-30  [id number], [name], MD  [name], MD     REFERRING PHYSICIAN: [name], MD   DIAGNOSIS: The primary encounter diagnosis was Adenocarcinoma of right lung, stage 4 ([place]). A diagnosis of Malignant neoplasm metastatic to brain [facility]) was also pertinent to this visit.   HISTORY OF PRESENT ILLNESS: Brian [name] is a 62 y.o. male with a history of Stage IIIB, cT1cN3M0, NSCLC, adenocarcinoma in January 2020.  He was treated with chemoradiation under the care of Dr. Julien Nordmann and Dr. Sondra Come.  He was found to have progressive disease in the summer 2020 in the right cervical lymph node chain and was treated with definitive radiotherapy.  He received consolidative immunotherapy.  He was found to have recurrent disease in the left cervical lymph nodes, and biopsy on 02/16/2020 confirmed the diagnosis.  He was ready to proceed with systemic chemotherapy, and an MRI brain on 02/21/20 for restaging revealed a 5 mm right lateral temporal lobe metastasis. A 3T MRI scan of the brain with and without contrast on 03/01/2020 showed the 5 mm metastasis in the right lateral temporal lobe, a 2 mm metastasis in the left posterior frontal cortex, and a 3 mm metastasis along the undersurface of the right thalamus.  Small vessel ischemic changes were also noted within the hemispheric white matter and were stable. He begin taking [name].   He met with our team and Dr. Lisbeth Renshaw offered [facility] treatment to these three lesions. The  patient was prepared to proceed, but couldn't tolerate the mask for simulation and declined to proceed. Since, his MRIs that have been followed closely have noted treatment response. His most recent scan of the brain on 10/15/22 showed no evidence of intracranial metastatic disease. Chronic ischemic and parasinus disease is noted. He's contacted today to review his results. Of note he has had edema or swelling of his right neck and is going to be having a CT of his neck soon.  PREVIOUS RADIATION THERAPY: Yes  03/06/2019-04/13/2019: The right cervical node chain was treated to 60 Gy in 30 fractions  09/05/2018-10/14/2018:  The right lung target was treated to 60 Gy in 30 fractions.   PAST MEDICAL HISTORY:  Past Medical History:  Diagnosis Date   COPD (chronic obstructive pulmonary disease) ([place])    Diabetes mellitus without complication ([place])    Hypertension    nscl ca dx'd 08/2018   Pneumonia    Subarachnoid hemorrhage ([place])        PAST SURGICAL HISTORY: Past Surgical History:  Procedure Laterality Date   INGUINAL HERNIA REPAIR Right 06/24/2020   Procedure: RIGHT OPEN INGUINAL HERNIA REPAIR WITH MESH;  Surgeon: Kinsinger, Arta Bruce, MD;  Location: [facility];  Service: General;  Laterality: Right;   IR IVC FILTER PLMT / S&I Burke Keels GUID/MOD SED  03/04/2020   IR IVC FILTER RETRIEVAL / S&I Burke Keels GUID/MOD SED  08/15/2020   IR RADIOLOGIST EVAL & MGMT  08/06/2020   LEG SURGERY  age 36  left leg, from fracture     FAMILY HISTORY:  Family History  Problem Relation Age of Onset   Colon cancer Neg Hx    Stomach cancer Neg Hx    Thyroid cancer Neg Hx      SOCIAL HISTORY:  reports that he has never smoked. He has never used smokeless tobacco. He reports current alcohol use of about 5.0 standard drinks of alcohol per week. He reports that he does not use drugs.  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   ALLERGIES: Patient has no known allergies.   MEDICATIONS:  Current Outpatient Medications  Medication Sig Dispense Refill   albuterol (VENTOLIN HFA) 108 (90 Base) MCG/ACT inhaler INHALE 2 PUFFS INTO THE LUNGS EVERY 4 (FOUR) HOURS AS NEEDED FOR WHEEZING OR SHORTNESS OF BREATH. 6.7 each 4   [name] 150 MG capsule TAKE 4 CAPSULES BY MOUTH TWICE DAILY WITH FOOD. SWALLOW CAPSULES WHOLE. DO NOT OPEN OR CRUSH. STORE IN ORIGINAL CONTAINER 240 capsule 2   amLODipine (NORVASC) 2.5 MG tablet Take 2.5 mg by mouth daily.     B Complex Vitamins (B COMPLEX PO) Take 1 tablet by mouth daily.     levothyroxine (SYNTHROID) 100 MCG tablet TAKE 1 TABLET BY MOUTH DAILY BEFORE BREAKFAST. 90 tablet 1   Multiple Vitamin (MULTIVITAMIN) tablet Take 1 tablet by mouth daily.     TRELEGY ELLIPTA 100-62.5-25 MCG/INH AEPB Inhale 1 puff into the lungs daily.     venlafaxine (EFFEXOR) 37.5 MG tablet Take 37.5 mg by mouth daily.      No current facility-administered medications for this encounter.     REVIEW OF SYSTEMS: The patient is doing well clinically. No specific complaints are verbalized regarding his brain, however he has had fullness in his right neck, somewhat tight at times and the skin feels quite thickened. No other complaints are verbalized.     PHYSICAL EXAM:  Unable to assess due to encounter type.  ECOG = 0  0 - Asymptomatic (Fully active, able to carry on all predisease activities without restriction)  1 - Symptomatic but completely ambulatory (Restricted in physically strenuous activity but ambulatory and able to carry out work of a light or sedentary nature. For example, light housework, office work)  2 - Symptomatic, <50% in bed during the day (Ambulatory and capable of all self care but unable to carry out any work activities. Up and about more than 50% of waking hours)  3 - Symptomatic, >50% in bed, but not bedbound (Capable of only limited self-care, confined to bed or chair 50%  or more of waking hours)  4 - Bedbound (Completely disabled. Cannot carry on any self-care. Totally confined to bed or chair)  5 - Death   Eustace Pen MM, Creech RH, Tormey DC, et al. [phone]). "Toxicity and response criteria of the [facility]". [place] Oncol. 5 (6): 649-55    LABORATORY DATA:  Lab Results  Component Value Date   WBC 11.7 (H) 10/05/2022   HGB 13.0 10/05/2022   HCT 37.4 (L) 10/05/2022   MCV 92.8 10/05/2022   PLT 250 10/05/2022   Lab Results  Component Value Date   NA 138 10/05/2022   K 4.2 10/05/2022   CL 103 10/05/2022   CO2 30 10/05/2022   Lab Results  Component Value Date   ALT 34 10/05/2022   AST 28 10/05/2022  ALKPHOS 108 10/05/2022   BILITOT 0.6 10/05/2022      RADIOGRAPHY: MR Brain W Wo Contrast  Result Date: 10/16/2022 CLINICAL DATA:  Provided history: Non-small cell cancer of right lung. Non-small cell lung cancer, monitor. EXAM: MRI HEAD WITHOUT AND WITH CONTRAST TECHNIQUE: Multiplanar, multiecho pulse sequences of the brain and surrounding structures were obtained without and with intravenous contrast. CONTRAST:  8.60mL GADAVIST GADOBUTROL 1 MMOL/ML IV SOLN COMPARISON:  Prior brain MRI examinations 10/09/2021 and earlier FINDINGS: Brain: No age advanced or lobar predominant parenchymal atrophy. Multifocal T2 FLAIR hyperintense signal abnormality within the cerebral white matter, nonspecific but compatible with mild chronic small vessel ischemic disease. Small chronic infarct within the right cerebellar hemisphere There is no acute infarct. No evidence of an intracranial mass. No chronic intracranial blood products. No extra-axial fluid collection. No midline shift. No pathologic intracranial enhancement identified. Vascular: Maintained flow voids within the proximal large arterial vessels. Skull and upper cervical spine: No focal suspicious marrow lesion. Sinuses/Orbits: No mass or acute finding within the imaged orbits. Partial T2  hyperintense opacification of the right frontal sinus and of an anterior right ethmoid air cell. Mild mucosal thickening scattered elsewhere within bilateral ethmoid air cells. Mild mucosal thickening within the bilateral maxillary sinuses. IMPRESSION: 1. No evidence of intracranial metastatic disease. 2. Mild chronic small vessel ischemic changes within the cerebral white matter, similar to the prior brain MRI of 10/09/2021. 3. 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 4. Paranasal sinus disease, as described Electronically Signed   By: Kellie Simmering D.O.   On: 10/16/2022 08:54        IMPRESSION/PLAN: 1. Progressive metastatic non-small cell lung cancer, adenocarcinoma originally in the right lung with prior concern for brain metastases. The patient continues to be without disease by MRI. He continues to tolerate [name], and remains under close surveillance. He is motivated to continue surveillance of his brain as well. We discussed there is not a standard of care of how to follow patients with this situation, but we feel that it is reasonable to continue to repeat MRI in 1 year as he continues to follow with Dr. Julien Nordmann.   2. Claustrophobia. The patient will take  Ativan  prior to  MRI scans and notify us if he needs a new prescription prior to next year's scan. 3. Neck edema. The patient may be having symptoms of lymphedema given his prior radiation. However he is going to have a CT of his neck to rule out disease given his oncologic history. We discussed possible role of Physical Therapy if his CT is negative for disease.         Carola Rhine, PAC

## 2022-10-19 NOTE — Telephone Encounter (Signed)
I received a message from radiation oncology that the patient is needing his restaging CT scan of the neck sooner than May 2024.  Per Dr. Worthy Flank order, Dr. Julien Nordmann had put the expected date on 10/12/2022.  I called the patient and left a voicemail with the number to radiology scheduling.  I instructed him to call radiology scheduling him to move up his CT scan of the neck only to the earliest availability.  I also received a message that he be interested in seeing physical therapy at [facility] for swelling of the neck if there is no evidence of infection, DVT, or recurrent lymphadenopathy as the etiology of his swelling.  I left our callback number and I also instructed him to send [place] a MyChart message if he had any further questions.

## 2022-10-19 NOTE — Progress Notes (Signed)
Telephone nursing appointment for patient to review most recent scan results from 10/15/22. I verified patient's identity and began nursing interview. Patient reports doing well.   Meaningful use complete.   Patient aware of their 2:00pm-10/19/22 telephone appointment w/ Shona Simpson PA-C. I left my extension [phone] in case patient needs anything. Patient verbalized understanding. This concludes the nursing interview.   Patient contact [phone]     Leandra Kern, LPN

## 2022-10-28 ENCOUNTER — Other Ambulatory Visit: Payer: Self-pay | Admitting: Radiation Therapy

## 2022-10-28 DIAGNOSIS — C7931 Secondary malignant neoplasm of brain: Secondary | ICD-10-CM

## 2022-11-03 ENCOUNTER — Other Ambulatory Visit: Payer: Self-pay

## 2022-11-05 ENCOUNTER — Other Ambulatory Visit: Payer: Self-pay

## 2022-11-05 DIAGNOSIS — C3490 Malignant neoplasm of unspecified part of unspecified bronchus or lung: Secondary | ICD-10-CM

## 2022-11-06 ENCOUNTER — Ambulatory Visit (HOSPITAL_COMMUNITY)
Admission: RE | Admit: 2022-11-06 | Discharge: 2022-11-06 | Disposition: A | Discharge status: Home / Self Care | Payer: BC Managed Care – PPO | Source: Ambulatory Visit | Attending: Internal Medicine | Admitting: Internal Medicine

## 2022-11-06 ENCOUNTER — Inpatient Hospital Stay: Payer: BC Managed Care – PPO | Attending: Internal Medicine

## 2022-11-06 DIAGNOSIS — J90 Pleural effusion, not elsewhere classified: Secondary | ICD-10-CM | POA: Diagnosis not present

## 2022-11-06 DIAGNOSIS — I10 Essential (primary) hypertension: Secondary | ICD-10-CM | POA: Diagnosis not present

## 2022-11-06 DIAGNOSIS — C770 Secondary and unspecified malignant neoplasm of lymph nodes of head, face and neck: Secondary | ICD-10-CM | POA: Diagnosis not present

## 2022-11-06 DIAGNOSIS — K4090 Unilateral inguinal hernia, without obstruction or gangrene, not specified as recurrent: Secondary | ICD-10-CM | POA: Diagnosis not present

## 2022-11-06 DIAGNOSIS — Z79899 Other long term (current) drug therapy: Secondary | ICD-10-CM | POA: Diagnosis not present

## 2022-11-06 DIAGNOSIS — N281 Cyst of kidney, acquired: Secondary | ICD-10-CM | POA: Diagnosis not present

## 2022-11-06 DIAGNOSIS — C3490 Malignant neoplasm of unspecified part of unspecified bronchus or lung: Secondary | ICD-10-CM

## 2022-11-06 DIAGNOSIS — C3411 Malignant neoplasm of upper lobe, right bronchus or lung: Secondary | ICD-10-CM | POA: Insufficient documentation

## 2022-11-06 DIAGNOSIS — C7931 Secondary malignant neoplasm of brain: Secondary | ICD-10-CM | POA: Insufficient documentation

## 2022-11-06 LAB — CBC WITH DIFFERENTIAL/PLATELET
Abs Immature Granulocytes: 0.1 10*3/uL — ABNORMAL HIGH (ref 0.00–0.07)
Basophils Absolute: 0.1 10*3/uL (ref 0.0–0.1)
Basophils Relative: 1 %
Eosinophils Absolute: 0.4 10*3/uL (ref 0.0–0.5)
Eosinophils Relative: 3 %
HCT: 37.2 % — ABNORMAL LOW (ref 39.0–52.0)
Hemoglobin: 12.6 g/dL — ABNORMAL LOW (ref 13.0–17.0)
Immature Granulocytes: 1 %
Lymphocytes Relative: 13 %
Lymphs Abs: 1.5 10*3/uL (ref 0.7–4.0)
MCH: 32.1 pg (ref 26.0–34.0)
MCHC: 33.9 g/dL (ref 30.0–36.0)
MCV: 94.7 fL (ref 80.0–100.0)
Monocytes Absolute: 1.6 10*3/uL — ABNORMAL HIGH (ref 0.1–1.0)
Monocytes Relative: 13 %
Neutro Abs: 8.1 10*3/uL — ABNORMAL HIGH (ref 1.7–7.7)
Neutrophils Relative %: 69 %
Platelets: 273 10*3/uL (ref 150–400)
RBC: 3.93 MIL/uL — ABNORMAL LOW (ref 4.22–5.81)
RDW: 15.1 % (ref 11.5–15.5)
WBC: 11.7 10*3/uL — ABNORMAL HIGH (ref 4.0–10.5)
nRBC: 0 % (ref 0.0–0.2)

## 2022-11-06 LAB — COMPREHENSIVE METABOLIC PANEL
ALT: 43 U/L (ref 0–44)
AST: 31 U/L (ref 15–41)
Albumin: 4.2 g/dL (ref 3.5–5.0)
Alkaline Phosphatase: 100 U/L (ref 38–126)
Anion gap: 8 (ref 5–15)
BUN: 21 mg/dL — ABNORMAL HIGH (ref 6–20)
CO2: 25 mmol/L (ref 22–32)
Calcium: 9 mg/dL (ref 8.9–10.3)
Chloride: 104 mmol/L (ref 98–111)
Creatinine, Ser: 1.03 mg/dL (ref 0.61–1.24)
GFR, Estimated: >60 mL/min (ref >60)
Glucose, Bld: 107 mg/dL — ABNORMAL HIGH (ref 70–99)
Potassium: 4.2 mmol/L (ref 3.5–5.1)
Sodium: 137 mmol/L (ref 135–145)
Total Bilirubin: 0.7 mg/dL (ref 0.3–1.2)
Total Protein: 6.8 g/dL (ref 6.5–8.1)

## 2022-11-06 LAB — TSH: TSH: 6.447 u[IU]/mL — ABNORMAL HIGH (ref 0.350–4.500)

## 2022-11-06 MED ORDER — IOHEXOL 300 MG/ML  SOLN
100.0000 mL | Freq: Once | INTRAMUSCULAR | Status: AC | PRN
  Administered 2022-11-06: 100 mL via INTRAVENOUS

## 2022-11-11 ENCOUNTER — Other Ambulatory Visit: Payer: Self-pay | Admitting: Internal Medicine

## 2022-11-11 DIAGNOSIS — C3491 Malignant neoplasm of unspecified part of right bronchus or lung: Secondary | ICD-10-CM

## 2022-11-19 DIAGNOSIS — M5412 Radiculopathy, cervical region: Secondary | ICD-10-CM | POA: Diagnosis not present

## 2022-12-03 ENCOUNTER — Other Ambulatory Visit: Payer: BC Managed Care – PPO

## 2022-12-03 ENCOUNTER — Other Ambulatory Visit (HOSPITAL_COMMUNITY): Payer: BC Managed Care – PPO

## 2022-12-07 ENCOUNTER — Inpatient Hospital Stay: Payer: BC Managed Care – PPO | Attending: Internal Medicine | Admitting: Internal Medicine

## 2022-12-07 ENCOUNTER — Other Ambulatory Visit: Payer: Self-pay

## 2022-12-07 VITALS — BP 130/89 | HR 83 | Temp 97.9°F | Resp 16 | Wt 192.9 lb

## 2022-12-07 DIAGNOSIS — Z923 Personal history of irradiation: Secondary | ICD-10-CM | POA: Diagnosis not present

## 2022-12-07 DIAGNOSIS — C770 Secondary and unspecified malignant neoplasm of lymph nodes of head, face and neck: Secondary | ICD-10-CM | POA: Diagnosis not present

## 2022-12-07 DIAGNOSIS — C3491 Malignant neoplasm of unspecified part of right bronchus or lung: Secondary | ICD-10-CM

## 2022-12-07 DIAGNOSIS — C7931 Secondary malignant neoplasm of brain: Secondary | ICD-10-CM | POA: Insufficient documentation

## 2022-12-07 DIAGNOSIS — Z9221 Personal history of antineoplastic chemotherapy: Secondary | ICD-10-CM | POA: Diagnosis not present

## 2022-12-07 DIAGNOSIS — C3411 Malignant neoplasm of upper lobe, right bronchus or lung: Secondary | ICD-10-CM | POA: Diagnosis not present

## 2022-12-07 DIAGNOSIS — D649 Anemia, unspecified: Secondary | ICD-10-CM | POA: Diagnosis not present

## 2022-12-07 DIAGNOSIS — E039 Hypothyroidism, unspecified: Secondary | ICD-10-CM | POA: Insufficient documentation

## 2022-12-07 DIAGNOSIS — Z79899 Other long term (current) drug therapy: Secondary | ICD-10-CM | POA: Insufficient documentation

## 2022-12-07 NOTE — Progress Notes (Signed)
[facility] [phone]   [phone]  OFFICE PROGRESS NOTE  Brian Pen, MD [address] [place] [address]  DIAGNOSIS:  Metastatic non-small cell lung cancer initially diagnosed as stage IIIB (T1c, N3, M0) non-small cell lung cancer, adenocarcinoma presented with right upper lobe lung mass in addition to mediastinal and bilateral supraclavicular lymphadenopathy diagnosed in January 2020.  He had evidence of disease recurrence in June 2021 with adenopathy in the subcarinal, level 3 left neck lymph node, and lymph node at the curious of the diaphragm and in the upper abdomen.   PD-L1: 10%   Guardant 360 molecular studies showed no actionable mutation   Foundation One Testing:   Biomarker Findings Microsatellite status - MS-Stable Tumor Mutational Burden - 4 Muts/Mb Genomic Findings For a complete list of the genes assayed, please refer to the Appendix. ALK EML4-ALK fusion (Variant 2) SMARCB1 R377C CTNNB1 S45P CDKN2A/B CDKN2B loss, CDKN2A loss CXCR4 W106* TP53 splice site 672G>A 7 Disease relevant genes with no reportable alterations: BRAF, EGFR, ERBB2, KRAS, MET, RET, ROS1  PRIOR THERAPY: 1) Concurrent chemoradiation with weekly carboplatin for AUC of 2 and paclitaxel 45 mg/M2.  Status post 6 cycles.  Last dose was given on October 10, 2018 with stable disease. 2) Radiation treatment to the enlarging right cervical lymph nodes under the care of Dr. Roselind [name]. First treatment 03/06/2019. Last treatment scheduled on 04/13/2019 3) Consolidation treatment with immunotherapy with Imfinzi 10 mg/KG every 2 weeks.  First dose November 17, 2018.  Status post 26 cycles. 4) Systemic chemotherapy with carboplatin for an AUC of 5, Alimta 500 mg/m2, and Keytruda 200 mg IV every 3 weeks. First dose expected on 02/14/20. Status post 1 cycle.  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 5) SBRT to the left lower lobe lung nodule.   CURRENT THERAPY:  [name] ([name]) 600 mg p.o. twice daily.  He started the first dose on March 08, 2020.   Status post 33 months of treatment.  INTERVAL HISTORY: Brian [name] 62 y.o. male returns to the clinic today for follow-up visit.  The patient is feeling fine today with no concerning complaints except for mild fatigue.  He also has nerve pain in the neck with radiation to the right arm.  He is seen by neurology.  He denied having any current chest pain, shortness of breath, cough or hemoptysis.  He has no nausea, vomiting, diarrhea or constipation.  He continues to tolerate his treatment with [name] ([name]) fairly well.  He had repeat CT scan of the neck, chest, abdomen and pelvis performed few weeks ago and he is here for evaluation and discussion of his imaging studies and recommendation regarding his condition.  MEDICAL HISTORY: Past Medical History:  Diagnosis Date   COPD (chronic obstructive pulmonary disease) (HCC)    Diabetes mellitus without complication (HCC)    Hypertension    nscl ca dx'd 08/2018   Pneumonia    Subarachnoid hemorrhage (HCC)     ALLERGIES:  has No Known Allergies.  MEDICATIONS:  Current Outpatient Medications  Medication Sig Dispense Refill   albuterol (VENTOLIN HFA) 108 (90 Base) MCG/ACT inhaler INHALE 2 PUFFS INTO THE LUNGS EVERY 4 (FOUR) HOURS AS NEEDED FOR WHEEZING OR SHORTNESS OF BREATH. 6.7 each 4   [name] 150 MG capsule TAKE 4 CAPSULES BY MOUTH TWICE DAILY WITH FOOD. SWALLOW CAPSULES WHOLE.  DO NOT OPEN OR CRUSH. STORE IN ORIGINAL CONTAINER 240 capsule 2   amLODipine (NORVASC) 2.5 MG tablet Take 2.5 mg by mouth daily.     B Complex Vitamins (B COMPLEX PO) Take 1 tablet by mouth daily.     levothyroxine (SYNTHROID) 100 MCG tablet TAKE 1 TABLET BY MOUTH DAILY BEFORE BREAKFAST. 90 tablet 1   Multiple Vitamin (MULTIVITAMIN) tablet Take 1 tablet by mouth daily.      TRELEGY ELLIPTA 100-62.5-25 MCG/INH AEPB Inhale 1 puff into the lungs daily.     venlafaxine (EFFEXOR) 37.5 MG tablet Take 37.5 mg by mouth daily.      No current facility-administered medications for this visit.    SURGICAL HISTORY:  Past Surgical History:  Procedure Laterality Date   INGUINAL HERNIA REPAIR Right 06/24/2020   Procedure: RIGHT OPEN INGUINAL HERNIA REPAIR WITH MESH;  Surgeon: Kinsinger, De Blanch, MD;  Location: [facility];  Service: General;  Laterality: Right;   IR IVC FILTER PLMT / S&I Brian [name] GUID/MOD SED  03/04/2020   IR IVC FILTER RETRIEVAL / S&I Brian [name] GUID/MOD SED  08/15/2020   IR RADIOLOGIST EVAL & MGMT  08/06/2020   LEG SURGERY  age 45    left leg, from fracture    REVIEW OF SYSTEMS:  Constitutional: positive for fatigue Eyes: negative Ears, nose, mouth, throat, and face: negative Respiratory: negative Cardiovascular: negative Gastrointestinal: negative Genitourinary:negative Integument/breast: negative Hematologic/lymphatic: negative Musculoskeletal:negative Neurological: positive for paresthesia Behavioral/Psych: negative Endocrine: negative Allergic/Immunologic: negative   PHYSICAL EXAMINATION: General appearance: alert, cooperative, fatigued, and no distress Head: Normocephalic, without obvious abnormality, atraumatic Neck: no JVD, supple, symmetrical, trachea midline, and thyroid: enlarged Lymph nodes: Cervical, supraclavicular, and axillary nodes normal. Resp: clear to auscultation bilaterally Back: symmetric, no curvature. ROM normal. No CVA tenderness. Cardio: regular rate and rhythm, S1, S2 normal, no murmur, click, rub or gallop GI: soft, non-tender; bowel sounds normal; no masses,  no organomegaly Extremities: extremities normal, atraumatic, no cyanosis or edema Neurologic: Alert and oriented X 3, normal strength and tone. Normal symmetric reflexes. Normal coordination and gait  ECOG PERFORMANCE STATUS: 1 - Symptomatic but completely  ambulatory  Blood pressure 130/89, pulse 83, temperature 97.9 F (36.6 C), resp. rate 16, weight 192 lb 14.4 oz (87.5 kg), SpO2 96 %.  LABORATORY DATA: Lab Results  Component Value Date   WBC 11.7 (H) 11/06/2022   HGB 12.6 (L) 11/06/2022   HCT 37.2 (L) 11/06/2022   MCV 94.7 11/06/2022   PLT 273 11/06/2022      Chemistry      Component Value Date/Time   NA 137 11/06/2022 1502   K 4.2 11/06/2022 1502   CL 104 11/06/2022 1502   CO2 25 11/06/2022 1502   BUN 21 (H) 11/06/2022 1502   CREATININE 1.03 11/06/2022 1502   CREATININE 1.00 10/05/2022 1458      Component Value Date/Time   CALCIUM 9.0 11/06/2022 1502   ALKPHOS 100 11/06/2022 1502   AST 31 11/06/2022 1502   AST 28 10/05/2022 1458   ALT 43 11/06/2022 1502   ALT 34 10/05/2022 1458   BILITOT 0.7 11/06/2022 1502   BILITOT 0.6 10/05/2022 1458       RADIOGRAPHIC STUDIES: No results found.   ASSESSMENT AND PLAN: This is a very pleasant 62 years old white male with metastatic non-small cell lung cancer, adenocarcinoma now with ALK gene translocation.  The patient was initially diagnosed as a stage IIIa non-small cell lung cancer status post a course of concurrent chemoradiation with weekly  carboplatin and paclitaxel followed by 1 year of consolidation treatment with immunotherapy with Imfinzi. He had evidence for disease progression recently. Repeat tissue biopsy from the left supraclavicular lymph nodes and molecular studies showed that the patient has positive ALK gene translocation.  His previous molecular studies by Guardant 360 was negative. He received 1 cycle of systemic chemotherapy with carboplatin, Alimta and Keytruda.  We will discontinue his systemic chemotherapy for now because of the new findings on the molecular studies. The patient is currently on treatment with [name] 600 mg p.o. twice daily.  He is status post 33 months of treatment.   The patient has been tolerating this treatment fairly well with no  concerning adverse effects. He had repeat CT scan of the neck, chest, abdomen and pelvis performed few weeks ago.  I personally and independently reviewed the scans and discussed the results with the patient today. His scan showed no concerning findings for disease progression. I recommended for him to continue his treatment with [name] ([name]) with the same dose. For the hypothyroidism, I will refer him to endocrinology for further evaluation and management. For the anemia he will continue on the oral iron tablet with vitamin C. I will see the patient back for follow-up visit in 2 months for evaluation and repeat blood work. He was advised to call immediately if he has any other concerning symptoms in the interval. The patient voices understanding of current disease status and treatment options and is in agreement with the current care plan.  All questions were answered. The patient knows to call the clinic with any problems, questions or concerns. We can certainly see the patient much sooner if necessary.  Disclaimer: This note was dictated with voice recognition software. Similar sounding words can inadvertently be transcribed and may not be corrected upon review.

## 2022-12-23 DIAGNOSIS — M5412 Radiculopathy, cervical region: Secondary | ICD-10-CM | POA: Diagnosis not present

## 2022-12-23 DIAGNOSIS — M25511 Pain in right shoulder: Secondary | ICD-10-CM | POA: Diagnosis not present

## 2023-01-04 ENCOUNTER — Other Ambulatory Visit: Payer: Self-pay | Admitting: Medical Oncology

## 2023-01-04 NOTE — Progress Notes (Signed)
Pt has appt scheduled in july to see Endocrinology.

## 2023-01-13 ENCOUNTER — Other Ambulatory Visit (HOSPITAL_COMMUNITY): Payer: Self-pay

## 2023-01-13 ENCOUNTER — Encounter: Payer: Self-pay | Admitting: Internal Medicine

## 2023-01-13 ENCOUNTER — Telehealth: Payer: Self-pay | Admitting: Pharmacy Technician

## 2023-01-13 NOTE — Telephone Encounter (Signed)
Oral Oncology Patient Advocate Encounter  Prior Authorization for Serita Butcher has been approved.    PA# [id number] Effective dates: 01/13/23 through 01/12/24  Patient must continue to fill at [facility].    Jinger Neighbors, CPhT-Adv Oncology Pharmacy Patient Advocate [facility] Direct Number: [phone]  Fax: [phone]

## 2023-01-13 NOTE — Telephone Encounter (Signed)
Oral Oncology Patient Advocate Encounter   Received notification that prior authorization for Serita Butcher is due for renewal.   PA submitted on 01/13/23 Key [id number] Status is pending     Jinger Neighbors, CPhT-Adv Oncology Pharmacy Patient Advocate [facility] Direct Number: [phone]  Fax: [phone]

## 2023-01-15 ENCOUNTER — Encounter: Payer: Self-pay | Admitting: Internal Medicine

## 2023-01-16 ENCOUNTER — Encounter: Payer: Self-pay | Admitting: Internal Medicine

## 2023-01-18 ENCOUNTER — Ambulatory Visit (HOSPITAL_COMMUNITY)
Admission: RE | Admit: 2023-01-18 | Discharge: 2023-01-18 | Disposition: A | Discharge status: Home / Self Care | Payer: BC Managed Care – PPO | Source: Ambulatory Visit | Attending: Internal Medicine | Admitting: Internal Medicine

## 2023-01-18 ENCOUNTER — Other Ambulatory Visit: Payer: Self-pay | Admitting: *Deleted

## 2023-01-18 ENCOUNTER — Encounter: Payer: Self-pay | Admitting: Internal Medicine

## 2023-01-18 DIAGNOSIS — C3490 Malignant neoplasm of unspecified part of unspecified bronchus or lung: Secondary | ICD-10-CM

## 2023-01-18 DIAGNOSIS — C3491 Malignant neoplasm of unspecified part of right bronchus or lung: Secondary | ICD-10-CM | POA: Diagnosis not present

## 2023-01-18 DIAGNOSIS — R0600 Dyspnea, unspecified: Secondary | ICD-10-CM | POA: Diagnosis not present

## 2023-01-18 DIAGNOSIS — J90 Pleural effusion, not elsewhere classified: Secondary | ICD-10-CM | POA: Diagnosis not present

## 2023-01-20 DIAGNOSIS — M5412 Radiculopathy, cervical region: Secondary | ICD-10-CM | POA: Diagnosis not present

## 2023-01-26 ENCOUNTER — Ambulatory Visit (HOSPITAL_COMMUNITY)
Admission: RE | Admit: 2023-01-26 | Discharge: 2023-01-26 | Disposition: A | Discharge status: Home / Self Care | Payer: BC Managed Care – PPO | Source: Ambulatory Visit | Attending: Internal Medicine | Admitting: Internal Medicine

## 2023-01-26 VITALS — BP 112/74

## 2023-01-26 DIAGNOSIS — C3491 Malignant neoplasm of unspecified part of right bronchus or lung: Secondary | ICD-10-CM | POA: Insufficient documentation

## 2023-01-26 DIAGNOSIS — J90 Pleural effusion, not elsewhere classified: Secondary | ICD-10-CM | POA: Insufficient documentation

## 2023-01-26 DIAGNOSIS — C3490 Malignant neoplasm of unspecified part of unspecified bronchus or lung: Secondary | ICD-10-CM | POA: Diagnosis not present

## 2023-01-26 DIAGNOSIS — R0600 Dyspnea, unspecified: Secondary | ICD-10-CM | POA: Diagnosis not present

## 2023-01-26 DIAGNOSIS — J984 Other disorders of lung: Secondary | ICD-10-CM | POA: Diagnosis not present

## 2023-01-26 MED ORDER — LIDOCAINE HCL 1 % IJ SOLN
INTRAMUSCULAR | Status: AC
  Filled 2023-01-26: qty 20

## 2023-01-26 NOTE — Procedures (Signed)
PROCEDURE SUMMARY:  Successful US guided therapeutic right thoracentesis. Yielded 800 cc of clear, dark yellow fluid. Pt tolerated procedure well. No immediate complications.  Specimen not sent for labs. CXR ordered.  EBL < 1 mL  Shon Hough, AGNP 01/26/2023 2:31 PM

## 2023-01-27 DIAGNOSIS — E782 Mixed hyperlipidemia: Secondary | ICD-10-CM | POA: Diagnosis not present

## 2023-01-27 DIAGNOSIS — I10 Essential (primary) hypertension: Secondary | ICD-10-CM | POA: Diagnosis not present

## 2023-01-27 DIAGNOSIS — Z0000 Encounter for general adult medical examination without abnormal findings: Secondary | ICD-10-CM | POA: Diagnosis not present

## 2023-01-27 DIAGNOSIS — E039 Hypothyroidism, unspecified: Secondary | ICD-10-CM | POA: Diagnosis not present

## 2023-01-27 DIAGNOSIS — E1165 Type 2 diabetes mellitus with hyperglycemia: Secondary | ICD-10-CM | POA: Diagnosis not present

## 2023-02-02 ENCOUNTER — Telehealth: Payer: Self-pay

## 2023-02-02 DIAGNOSIS — E039 Hypothyroidism, unspecified: Secondary | ICD-10-CM

## 2023-02-02 NOTE — Telephone Encounter (Signed)
Brian [name], CMA  ?

## 2023-02-05 ENCOUNTER — Other Ambulatory Visit: Payer: BC Managed Care – PPO

## 2023-02-09 ENCOUNTER — Ambulatory Visit (INDEPENDENT_AMBULATORY_CARE_PROVIDER_SITE_OTHER): Payer: BC Managed Care – PPO | Admitting: "Endocrinology

## 2023-02-09 ENCOUNTER — Encounter: Payer: Self-pay | Admitting: "Endocrinology

## 2023-02-09 VITALS — BP 90/70 | HR 88 | Ht 68.0 in | Wt 188.4 lb

## 2023-02-09 DIAGNOSIS — R221 Localized swelling, mass and lump, neck: Secondary | ICD-10-CM | POA: Diagnosis not present

## 2023-02-09 DIAGNOSIS — E039 Hypothyroidism, unspecified: Secondary | ICD-10-CM | POA: Diagnosis not present

## 2023-02-09 NOTE — Progress Notes (Signed)
Outpatient Endocrinology Note Brian Tolono, MD  02/09/23   Brian [name] [birth year] [id number]  Referring Provider: Si Gaul, MD Primary Care Provider: [name], MD Subjective  No chief complaint on file.   Assessment & Plan  Diagnoses and all orders for this visit:  Hypothyroidism, unspecified type -     TSH Rfx on Abnormal to Free T4; Future -     US THYROID; Future  Mass of right side of neck    Brian [name] is currently taking levothyroxine 100 mcg qd. Takes in morning with other medication.  Patient is currently biochemically euthyroid.  Educated on thyroid axis.  Recommend the following: Take levothyroxine 100 every morning.  Advised to take levothyroxine first thing in the morning on empty stomach and wait at least 30 minutes to 1 hour before eating or drinking anything or taking any other medications. Space out levothyroxine by 4 hours from any acid reflux medication/fibrate/iron/calcium/multivitamin. Advised to take birth control pills and nutritional supplements in the evening. Repeat lab before next visit or sooner if symptoms of hyperthyroidism or hypothyroidism develop.  Notify us immediately in case of pregnancy/breastfeeding or significant weight gain or loss. Counseled on compliance and follow up needs.  I have reviewed current medications, nurse's notes, allergies, vital signs, past medical and surgical history, family medical history, and social history for this encounter. Counseled patient on symptoms, examination findings, lab findings, imaging results, treatment decisions and monitoring and prognosis. The patient understood the recommendations and agrees with the treatment plan. All questions regarding treatment plan were fully answered.   Return in about 3 months (around 05/14/2023) for visit + labs before next visit.   Brian Sikes, MD  02/09/23   I have reviewed current medications, nurse's notes, allergies, vital  signs, past medical and surgical history, family medical history, and social history for this encounter. Counseled patient on symptoms, examination findings, lab findings, imaging results, treatment decisions and monitoring and prognosis. The patient understood the recommendations and agrees with the treatment plan. All questions regarding treatment plan were fully answered.   History of Present Illness Brian [name] is a 62 y.o. year old male who presents to our clinic with hypothyroidism around 2020, post the diagbsoes of cancer per patient.   Was diagnosed of stage IIIB lung cancer s/p chemoradiation in 2020 as well as immunotherapy. Dr. Gwenyth Bouillon is the oncologist.  Symptoms suggestive of HYPOTHYROIDISM:  fatigue Yes weight gain No, has hard time losing weight  cold intolerance  No constipation  Yes  Compressive symptoms:  dysphagia  Yes, some dysphonia  Yes, deeper  positional dyspnea (especially with simultaneous arms elevation)  No  Skin/fat bulge on right side of neck-developed post radiation. Patient assumes it is a goiter.  Physical Exam  BP 90/70   Pulse 88   Ht 5\' 8"  (1.727 m)   Wt 188 lb 6.4 oz (85.5 kg)   SpO2 95%   BMI 28.65 kg/m  Constitutional: well developed, well nourished Head: normocephalic, atraumatic, no exophthalmos Eyes: sclera anicteric, no redness Neck: no thyromegaly, no thyroid tenderness; no nodules palpated. Skin/fat bulge on right side of neck-developed post radiation of right sided neck nodules    Lungs: normal respiratory effort Neurology: alert and oriented, no fine hand tremor Skin: dry, no appreciable rashes Musculoskeletal: no appreciable defects Psychiatric: normal mood and affect  Allergies No Known Allergies  Current Medications Patient's Medications  New Prescriptions   No medications on file  Previous Medications  ALBUTEROL (VENTOLIN HFA) 108 (90 BASE) MCG/ACT INHALER    INHALE 2 PUFFS INTO THE LUNGS EVERY 4 (FOUR) HOURS AS  NEEDED FOR WHEEZING OR SHORTNESS OF BREATH.   [name] 150 MG CAPSULE    TAKE 4 CAPSULES BY MOUTH TWICE DAILY WITH FOOD. SWALLOW CAPSULES WHOLE. DO NOT OPEN OR CRUSH. STORE IN ORIGINAL CONTAINER   AMLODIPINE (NORVASC) 2.5 MG TABLET    Take 2.5 mg by mouth daily.   ATORVASTATIN (LIPITOR) 20 MG TABLET    Take 20 mg by mouth daily.   B COMPLEX VITAMINS (B COMPLEX PO)    Take 1 tablet by mouth daily.   LEVOTHYROXINE (SYNTHROID) 100 MCG TABLET    TAKE 1 TABLET BY MOUTH DAILY BEFORE BREAKFAST.   MULTIPLE VITAMIN (MULTIVITAMIN) TABLET    Take 1 tablet by mouth daily.   TRELEGY ELLIPTA 100-62.5-25 MCG/INH AEPB    Inhale 1 puff into the lungs daily.   VENLAFAXINE (EFFEXOR) 37.5 MG TABLET    Take 37.5 mg by mouth daily.   Modified Medications   No medications on file  Discontinued Medications   No medications on file    Past Medical History Past Medical History:  Diagnosis Date   COPD (chronic obstructive pulmonary disease) (HCC)    Diabetes mellitus without complication (HCC)    Hypertension    nscl ca dx'd 08/2018   Pneumonia    Subarachnoid hemorrhage [facility])     Past Surgical History Past Surgical History:  Procedure Laterality Date   INGUINAL HERNIA REPAIR Right 06/24/2020   Procedure: RIGHT OPEN INGUINAL HERNIA REPAIR WITH MESH;  Surgeon: Kinsinger, De Blanch, MD;  Location: [facility];  Service: General;  Laterality: Right;   IR IVC FILTER PLMT / S&I Lenise Arena GUID/MOD SED  03/04/2020   IR IVC FILTER RETRIEVAL / S&I Lenise Arena GUID/MOD SED  08/15/2020   IR RADIOLOGIST EVAL & MGMT  08/06/2020   LEG SURGERY  age 27    left leg, from fracture    Family History family history is not on file.  Social History Social History   Socioeconomic History   Marital status: Married    Spouse name: Not on file   Number of children: Not on file   Years of education: Not on file   Highest education level: Not on file  Occupational History   Not on file  Tobacco Use   Smoking status: Never   Smokeless  tobacco: Never  Vaping Use   Vaping Use: Never used  Substance and Sexual Activity   Alcohol use: Yes    Alcohol/week: 5.0 standard drinks of alcohol    Types: 5 Standard drinks or equivalent per week    Comment: occasionally   Drug use: No   Sexual activity: Not on file  Other Topics Concern   Not on file  Social History Narrative   Not on file   Social Determinants of Health   Financial Resource Strain: Not on file  Food Insecurity: No Food Insecurity (10/19/2022)   Hunger Vital Sign    Worried About Running Out of Food in the Last Year: Never true    Ran Out of Food in the Last Year: Never true  Transportation Needs: No Transportation Needs (10/19/2022)   PRAPARE - Administrator, Civil Service (Medical): No    Lack of Transportation (Non-Medical): No  Physical Activity: Not on file  Stress: Not on file  Social Connections: Not on file  Intimate Partner Violence: Not At Risk (10/19/2022)  Humiliation, Afraid, Rape, and Kick questionnaire    Fear of Current or Ex-Partner: No    Emotionally Abused: No    Physically Abused: No    Sexually Abused: No    Laboratory Investigations Lab Results  Component Value Date   TSH 6.447 (H) 11/06/2022   TSH 5.578 (H) 10/05/2022   TSH 7.427 (H) 11/20/2020     No results found for: "TSI"   No components found for: "TRAB"   No results found for: "CHOL" No results found for: "HDL" No results found for: "LDLCALC" No results found for: "TRIG" No results found for: "CHOLHDL" Lab Results  Component Value Date   CREATININE 1.03 11/06/2022   No results found for: "GFR"    Component Value Date/Time   NA 137 11/06/2022 1502   K 4.2 11/06/2022 1502   CL 104 11/06/2022 1502   CO2 25 11/06/2022 1502   GLUCOSE 107 (H) 11/06/2022 1502   BUN 21 (H) 11/06/2022 1502   CREATININE 1.03 11/06/2022 1502   CREATININE 1.00 10/05/2022 1458   CALCIUM 9.0 11/06/2022 1502   PROT 6.8 11/06/2022 1502   ALBUMIN 4.2 11/06/2022 1502    AST 31 11/06/2022 1502   AST 28 10/05/2022 1458   ALT 43 11/06/2022 1502   ALT 34 10/05/2022 1458   ALKPHOS 100 11/06/2022 1502   BILITOT 0.7 11/06/2022 1502   BILITOT 0.6 10/05/2022 1458   GFRNONAA >60 11/06/2022 1502   GFRNONAA >60 10/05/2022 1458   GFRAA >60 05/03/2020 1515      Latest Ref Rng & Units 11/06/2022    3:02 PM 10/05/2022    2:58 PM 08/04/2022   10:21 AM  BMP  Glucose 70 - 99 mg/dL 191  478  82   BUN 6 - 20 mg/dL 21  17  16    Creatinine 0.61 - 1.24 mg/dL 2.95  6.21  3.08   Sodium 135 - 145 mmol/L 137  138  138   Potassium 3.5 - 5.1 mmol/L 4.2  4.2  4.1   Chloride 98 - 111 mmol/L 104  103  104   CO2 22 - 32 mmol/L 25  30  27    Calcium 8.9 - 10.3 mg/dL 9.0  9.1  9.1        Component Value Date/Time   WBC 11.7 (H) 11/06/2022 1502   RBC 3.93 (L) 11/06/2022 1502   HGB 12.6 (L) 11/06/2022 1502   HGB 13.0 10/05/2022 1458   HCT 37.2 (L) 11/06/2022 1502   PLT 273 11/06/2022 1502   PLT 250 10/05/2022 1458   MCV 94.7 11/06/2022 1502   MCH 32.1 11/06/2022 1502   MCHC 33.9 11/06/2022 1502   RDW 15.1 11/06/2022 1502   LYMPHSABS 1.5 11/06/2022 1502   MONOABS 1.6 (H) 11/06/2022 1502   EOSABS 0.4 11/06/2022 1502   BASOSABS 0.1 11/06/2022 1502      Parts of this note may have been dictated using voice recognition software. There may be variances in spelling and vocabulary which are unintentional. Not all errors are proofread. Please notify the Thereasa Parkin if any discrepancies are noted or if the meaning of any statement is not clear.

## 2023-02-12 DIAGNOSIS — G540 Brachial plexus disorders: Secondary | ICD-10-CM | POA: Diagnosis not present

## 2023-02-17 ENCOUNTER — Ambulatory Visit (HOSPITAL_COMMUNITY)
Admission: RE | Admit: 2023-02-17 | Discharge: 2023-02-17 | Disposition: A | Discharge status: Home / Self Care | Payer: BC Managed Care – PPO | Source: Ambulatory Visit | Attending: "Endocrinology | Admitting: "Endocrinology

## 2023-02-17 DIAGNOSIS — E0789 Other specified disorders of thyroid: Secondary | ICD-10-CM | POA: Diagnosis not present

## 2023-02-17 DIAGNOSIS — G5602 Carpal tunnel syndrome, left upper limb: Secondary | ICD-10-CM | POA: Diagnosis not present

## 2023-02-17 DIAGNOSIS — E039 Hypothyroidism, unspecified: Secondary | ICD-10-CM | POA: Diagnosis not present

## 2023-02-17 DIAGNOSIS — G5622 Lesion of ulnar nerve, left upper limb: Secondary | ICD-10-CM | POA: Diagnosis not present

## 2023-02-18 ENCOUNTER — Encounter: Payer: Self-pay | Admitting: Neurology

## 2023-02-22 DIAGNOSIS — M542 Cervicalgia: Secondary | ICD-10-CM | POA: Diagnosis not present

## 2023-02-22 DIAGNOSIS — M5412 Radiculopathy, cervical region: Secondary | ICD-10-CM | POA: Diagnosis not present

## 2023-02-24 ENCOUNTER — Other Ambulatory Visit: Payer: Self-pay | Admitting: Physician Assistant

## 2023-02-24 DIAGNOSIS — C3491 Malignant neoplasm of unspecified part of right bronchus or lung: Secondary | ICD-10-CM

## 2023-03-09 NOTE — Progress Notes (Deleted)
Initial neurology clinic note  Reason for Evaluation: Consultation requested by Rulon Eisenmenger* for an opinion regarding upper extremity pain and paresthesias. My final recommendations will be communicated back to the requesting physician by way of shared medical record or letter to requesting physician via [place] mail.  HPI: This is [name], a 62 y.o. ***-handed male with a medical history of diabetes, hypothyroidism, lung cancer s/p chemo and radiation, emphysema, and HLD*** who presents to neurology clinic with the chief complaint of ***. The patient is accompanied by ***.  *** BUE pain and paresthesias (R > L); appears to have started in 06/2022 with right shoulder pain Saw neurology? Or PM&R at [facility] and had EMG on 02/12/23 and 02/17/23   Oncology history per Dr. Asa Lente oncology clinic note (12/07/22):  Metastatic non-small cell lung cancer initially diagnosed as stage IIIB (T1c, N3, M0) non-small cell lung cancer, adenocarcinoma presented with right upper lobe lung mass in addition to mediastinal and bilateral supraclavicular lymphadenopathy diagnosed in January 2020.  He had evidence of disease recurrence in June 2021 with adenopathy in the subcarinal, level 3 left neck lymph node, and lymph node at the curious of the diaphragm and in the upper abdomen.   PRIOR THERAPY: 1) Concurrent chemoradiation with weekly carboplatin for AUC of 2 and paclitaxel 45 mg/M2.  Status post 6 cycles.  Last dose was given on October 10, 2018 with stable disease. 2) Radiation treatment to the enlarging right cervical lymph nodes under the care of Dr. Roselind Messier. First treatment 03/06/2019. Last treatment scheduled on 04/13/2019 3) Consolidation treatment with immunotherapy with Imfinzi 10 mg/KG every 2 weeks.  First dose November 17, 2018.  Status post 26 cycles. 4) Systemic chemotherapy with carboplatin for an AUC of 5, Alimta 500 mg/m2, and Keytruda 200 mg IV every 3 weeks. First dose expected on  02/14/20. Status post 1 cycle.  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 5) SBRT to the left lower lobe lung nodule.   CURRENT THERAPY:  [name] ([name]) 600 mg p.o. twice daily.  He started the first dose on March 08, 2020.   Status post 33 months of treatment.  The patient has not*** had similar episodes of symptoms in the past. ***  Muscle bulk loss? *** Muscle pain? ***  Cramps/Twitching? *** Suggestion of myotonia/difficulty relaxing after contraction? ***  Fatigable weakness?*** Does strength improve after brief exercise?***  Able to brush hair/teeth without difficulty? *** Able to button shirts/use zips? *** Clumsiness/dropping grasped objects?*** Can you arise from squatted position easily? *** Able to get out of chair without using arms? *** Able to walk up steps easily? *** Use an assistive device to walk? *** Significant imbalance with walking? *** Falls?*** Any change in urine color, especially after exertion/physical activity? ***  The patient denies*** symptoms suggestive of oculobulbar weakness including diplopia, ptosis, dysphagia, poor saliva control, dysarthria/dysphonia, impaired mastication, facial weakness/droop.  There are no*** neuromuscular respiratory weakness symptoms, particularly orthopnea>dyspnea.   Pseudobulbar affect is absent***.  The patient does not*** report symptoms referable to autonomic dysfunction including impaired sweating, heat or cold intolerance, excessive mucosal dryness, gastroparetic early satiety, postprandial abdominal bloating, constipation, bowel or bladder dyscontrol, erectile dysfunction*** or syncope/presyncope/orthostatic intolerance.  There are no*** complaints relating to other symptoms of small fiber modalities including paresthesia/pain.  The patient has not *** noticed any recent skin rashes nor does he*** report any constitutional  symptoms like  fever, night sweats, anorexia or unintentional weight loss.  EtOH use: ***  Restrictive diet? *** Family history of neuropathy/myopathy/NM disease?***  Previous labs, electrodiagnostics, and neuroimaging are summarized below, but pertinent findings include***  Any biopsy done? *** Current medications being tried for the patient's symptoms include ***  Prior medications that have been tried: ***   MEDICATIONS:  Outpatient Encounter Medications as of 03/17/2023  Medication Sig   albuterol (VENTOLIN HFA) 108 (90 Base) MCG/ACT inhaler INHALE 2 PUFFS INTO THE LUNGS EVERY 4 (FOUR) HOURS AS NEEDED FOR WHEEZING OR SHORTNESS OF BREATH.   [name] 150 MG capsule TAKE 4 CAPSULES BY MOUTH TWICE DAILY WITH FOOD. SWALLOW CAPSULES WHOLE. DO NOT OPEN OR CRUSH. STORE IN ORIGINAL CONTAINER   amLODipine (NORVASC) 2.5 MG tablet Take 2.5 mg by mouth daily. (Patient not taking: Reported on 02/09/2023)   atorvastatin (LIPITOR) 20 MG tablet Take 20 mg by mouth daily.   B Complex Vitamins (B COMPLEX PO) Take 1 tablet by mouth daily.   levothyroxine (SYNTHROID) 100 MCG tablet TAKE 1 TABLET BY MOUTH DAILY BEFORE BREAKFAST.   Multiple Vitamin (MULTIVITAMIN) tablet Take 1 tablet by mouth daily.   TRELEGY ELLIPTA 100-62.5-25 MCG/INH AEPB Inhale 1 puff into the lungs daily.   venlafaxine (EFFEXOR) 37.5 MG tablet Take 37.5 mg by mouth daily.    No facility-administered encounter medications on file as of 03/17/2023.    PAST MEDICAL HISTORY: Past Medical History:  Diagnosis Date   COPD (chronic obstructive pulmonary disease) (HCC)    Diabetes mellitus without complication (HCC)    Hypertension    nscl ca dx'd 08/2018   Pneumonia    Subarachnoid hemorrhage (HCC)     PAST SURGICAL HISTORY: Past Surgical History:  Procedure Laterality Date   INGUINAL HERNIA REPAIR Right 06/24/2020   Procedure: RIGHT OPEN INGUINAL HERNIA REPAIR WITH MESH;  Surgeon: Kinsinger, De Blanch, MD;  Location: [facility];   Service: General;  Laterality: Right;   IR IVC FILTER PLMT / S&I Lenise Arena GUID/MOD SED  03/04/2020   IR IVC FILTER RETRIEVAL / S&I Lenise Arena GUID/MOD SED  08/15/2020   IR RADIOLOGIST EVAL & MGMT  08/06/2020   LEG SURGERY  age 39    left leg, from fracture    ALLERGIES: No Known Allergies  FAMILY HISTORY: Family History  Problem Relation Age of Onset   Colon cancer Neg Hx    Stomach cancer Neg Hx    Thyroid cancer Neg Hx     SOCIAL HISTORY: Social History   Tobacco Use   Smoking status: Never   Smokeless tobacco: Never  Vaping Use   Vaping status: Never Used  Substance Use Topics   Alcohol use: Yes    Alcohol/week: 5.0 standard drinks of alcohol    Types: 5 Standard drinks or equivalent per week    Comment: occasionally   Drug use: No   Social History   Social History Narrative   Not on file     OBJECTIVE: PHYSICAL EXAM: There were no vitals taken for this visit.  General:*** General appearance: Awake and alert. No distress. Cooperative with exam.  Skin: No obvious rash or jaundice. HEENT: Atraumatic. Anicteric. Lungs: Non-labored breathing on room air  Heart: Regular Abdomen: Soft, non tender. Extremities: No edema. No obvious deformity.  Musculoskeletal: No obvious joint swelling. Psych: Affect appropriate.  Neurological: Mental Status: Alert. Speech fluent. No pseudobulbar affect Cranial Nerves: CNII: No RAPD. Visual fields grossly intact. CNIII, IV, VI: PERRL. No nystagmus. EOMI. CN V: Facial sensation intact  bilaterally to fine touch. [place] clench strong. Jaw jerk***. CN VII: Facial muscles symmetric and strong. No ptosis at rest or after sustained upgaze***. CN VIII: Hearing grossly intact bilaterally. CN IX: No hypophonia. CN X: Palate elevates symmetrically. CN XI: Full strength shoulder shrug bilaterally. CN XII: Tongue protrusion full and midline. No atrophy or fasciculations. No significant dysarthria*** Motor: Tone is ***. *** fasciculations in  *** extremities. *** atrophy. No grip or percussive myotonia.***  Individual muscle group testing (MRC grade out of 5):  Movement     Neck flexion ***    Neck extension ***     Right Left   Shoulder abduction *** ***   Shoulder adduction *** ***   Shoulder ext rotation *** ***   Shoulder int rotation *** ***   Elbow flexion *** ***   Elbow extension *** ***   Wrist extension *** ***   Wrist flexion *** ***   Finger abduction - FDI *** ***   Finger abduction - ADM *** ***   Finger extension *** ***   Finger distal flexion - 2/3 *** ***   Finger distal flexion - 4/5 *** ***   Thumb flexion - FPL *** ***   Thumb abduction - APB *** ***    Hip flexion *** ***   Hip extension *** ***   Hip adduction *** ***   Hip abduction *** ***   Knee extension *** ***   Knee flexion *** ***   Dorsiflexion *** ***   Plantarflexion *** ***   Inversion *** ***   Eversion *** ***   Great toe extension *** ***   Great toe flexion *** ***     Reflexes:  Right Left   Bicep *** ***   Tricep *** ***   BrRad *** ***   Knee *** ***   Ankle *** ***    Pathological Reflexes: Babinski: *** response bilaterally*** Hoffman: *** Troemner: *** Pectoral: *** Palmomental: *** Facial: *** Midline tap: *** Sensation: Pinprick: *** Vibration: *** Temperature: *** Proprioception: *** Coordination: Intact finger-to- nose-finger bilaterally. Romberg negative.*** Gait: Able to rise from chair with arms crossed unassisted. Normal, narrow-based gait. Able to tandem walk. Able to walk on toes and heels.***  Lab and Test Review: Internal labs: TSH (11/06/22): elevated to 6.447  External labs: 01/27/23: CMP significant for mildly elevated glucose (112), Tbili (1.1), and Alk phos (124) TSH wnl HbA1c: 5.8 Lipid panel: total cholesterol 151, TG 75, LDL 62  B12 (10/20/21): 921  Imaging: External EMG (02/12/23 - cannot see table only this note): Arvill Libengood is a 61 y.o. year old male with  BUE pain and paresthesias (R > L) referred for electrodiagnostics to evaluate for peripheral nerve entrapment versus brachial plexopathy versus cervical radiculopathy. Patient does have a history of lung cancer s/p chemotherapy and radiation to the right upper lung field and right neck region. He also has a history of T2DM. Due to the severity of the findings, testing was limited to the right upper extremity today. He will come back next week for left upper extremity testing. 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 There is evidence of acute denervation in all right upper extremity muscles tested. This is consistent with the patient's clinical history of radiation. Due to the above plexopathy and diffuse findings in the right upper extremity on EMG, cannot rule out a more diffuse  process such as motor neuron disease. Will bring patient back in to test for the left upper extremity next week. While the median nerve conduction studies were abnormal, this may be attributed to the more proximal plexopathy, but cannot definitively rule out a concomitant median neuropathy.   External EMG (02/17/23 - cannot see table only this note): Gianno Rondan is a 62 y.o. year old male referred for electrodiagnostics, findings consistent with: Acute on chronic left C8 radiculopathy. Severe left-sided carpal tunnel syndrome. Left-sided cubital tunnel syndrome.   MRI brain w/wo contrast (10/15/22): FINDINGS: Brain:   No age advanced or lobar predominant parenchymal atrophy.   Multifocal T2 FLAIR hyperintense signal abnormality within the cerebral white matter, nonspecific but compatible with mild chronic small vessel ischemic disease.   Small chronic infarct within the right cerebellar hemisphere   There is no acute infarct.   No evidence of an intracranial  mass.   No chronic intracranial blood products.   No extra-axial fluid collection.   No midline shift.   No pathologic intracranial enhancement identified.   Vascular: Maintained flow voids within the proximal large arterial vessels.   Skull and upper cervical spine: No focal suspicious marrow lesion.   Sinuses/Orbits: No mass or acute finding within the imaged orbits. Partial T2 hyperintense opacification of the right frontal sinus and of an anterior right ethmoid air cell. Mild mucosal thickening scattered elsewhere within bilateral ethmoid air cells. Mild mucosal thickening within the bilateral maxillary sinuses.   IMPRESSION: 1. No evidence of intracranial metastatic disease. 2. Mild chronic small vessel ischemic changes within the cerebral white matter, similar to the prior brain MRI of 10/09/2021. 3. 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 4. Paranasal sinus disease, as described  MRI right brachial plexus (07/30/22): FINDINGS: Spinal cord   Visualized spinal cord demonstrates normal signal.   Brachial plexus:   Appears normal. No mass impinging on the brachial plexus is identified. No neurogenic tumor is identified.   Muscles and tendons   Normal signal throughout without evidence of denervation atrophy.   Bones   No fracture or worrisome lesion is seen. Small degenerative cyst in the right humeral head is noted.   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 The glenohumeral joint appears normal.   Other findings   Moderately large right pleural effusion appears increased in size compared to the prior chest CT.   IMPRESSION: Normal appearing right brachial plexus.   Moderately large right pleural effusion appears increased in size compared to the patient's 03/30/2022 chest CT.   Acromioclavicular osteoarthritis.  MRI cervical spine wo contrast  (07/30/22): FINDINGS: Alignment: Physiologic.   Vertebrae: No fracture, evidence of discitis, or bone lesion.   Cord: Normal signal and morphology.   Posterior Fossa, vertebral arteries, paraspinal tissues: Negative.   Disc levels:   C2-C3: No significant disc bulge. Mild facet arthropathy. No spinal canal stenosis or neural foraminal narrowing.   C3-C4: No significant disc bulge. Left-greater-than-right facet arthropathy. No spinal canal stenosis or neural foraminal narrowing.   C4-C5: No significant disc bulge. Left-greater-than-right facet arthropathy. No spinal canal stenosis or neural foraminal narrowing.   C5-C6: Minimal disc bulge with right paracentral and subarticular disc protrusion, which indents the thecal sac but does not deform the spinal cord. Facet and uncovertebral hypertrophy. No spinal canal stenosis. Mild right neural foraminal narrowing.   C6-C7: No significant disc bulge. No spinal canal stenosis or neuroforaminal narrowing.   C7-T1: No significant disc bulge.  No spinal canal stenosis or neuroforaminal narrowing.   IMPRESSION: 1. C5-C6 mild right neural foraminal narrowing. 2. Multilevel facet arthropathy, which can be a cause of pain. ***  ASSESSMENT: FILIBERTO KISTER is a 62 y.o. male who presents for evaluation of ***. *** has a relevant medical history of ***. *** neurological examination is pertinent for ***. Available diagnostic data is significant for ***. This constellation of symptoms and objective data would most likely localize to ***. ***  PLAN: -Blood work: *** ***  -Return to clinic ***  The impression above as well as the plan as outlined below were extensively discussed with the patient (in the company of ***) who voiced understanding. All questions were answered to their satisfaction.  The patient was counseled on pertinent fall precautions per the printed material provided today, and as noted under the "Patient Instructions"  section below.***  When available, results of the above investigations and possible further recommendations will be communicated to the patient via telephone/MyChart. Patient to call office if not contacted after expected testing turnaround time.   Total time spent reviewing records, interview, history/exam, documentation, and coordination of care on day of encounter:  *** min   Thank you for allowing me to participate in patient's care.  If I can answer any additional questions, I would be pleased to do so.  Jacquelyne Balint, MD   CC: [name], MD [address], [address] [place] [address]  CC: Referring provider: Marvel Plan, MD [facility] [address] [place],  [place] [address]

## 2023-03-10 NOTE — Progress Notes (Deleted)
Initial neurology clinic note  Reason for Evaluation: Consultation requested by Rulon Eisenmenger* for an opinion regarding upper extremity pain and paresthesias. My final recommendations will be communicated back to the requesting physician by way of shared medical record or letter to requesting physician via [place] mail.  HPI: This is [name], a 62 y.o. ***-handed male with a medical history of diabetes, hypothyroidism, lung cancer s/p chemo and radiation, emphysema, and HLD*** who presents to neurology clinic with the chief complaint of ***. The patient is accompanied by ***.  *** BUE pain and paresthesias (R > L); appears to have started in 06/2022 with right shoulder pain Saw neurology? Or PM&R at [facility] and had EMG on 02/12/23 and 02/17/23   Oncology history per Dr. Asa Lente oncology clinic note (12/07/22):  Metastatic non-small cell lung cancer initially diagnosed as stage IIIB (T1c, N3, M0) non-small cell lung cancer, adenocarcinoma presented with right upper lobe lung mass in addition to mediastinal and bilateral supraclavicular lymphadenopathy diagnosed in January 2020.  He had evidence of disease recurrence in June 2021 with adenopathy in the subcarinal, level 3 left neck lymph node, and lymph node at the curious of the diaphragm and in the upper abdomen.   PRIOR THERAPY: 1) Concurrent chemoradiation with weekly carboplatin for AUC of 2 and paclitaxel 45 mg/M2.  Status post 6 cycles.  Last dose was given on October 10, 2018 with stable disease. 2) Radiation treatment to the enlarging right cervical lymph nodes under the care of Dr. Roselind Messier. First treatment 03/06/2019. Last treatment scheduled on 04/13/2019 3) Consolidation treatment with immunotherapy with Imfinzi 10 mg/KG every 2 weeks.  First dose November 17, 2018.  Status post 26 cycles. 4) Systemic chemotherapy with carboplatin for an AUC of 5, Alimta 500 mg/m2, and Keytruda 200 mg IV every 3 weeks. First dose expected on  02/14/20. Status post 1 cycle.  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 5) SBRT to the left lower lobe lung nodule.   CURRENT THERAPY:  [name] ([name]) 600 mg p.o. twice daily.  He started the first dose on March 08, 2020.   Status post 33 months of treatment.  The patient has not*** had similar episodes of symptoms in the past. ***  Muscle bulk loss? *** Muscle pain? ***  Cramps/Twitching? *** Suggestion of myotonia/difficulty relaxing after contraction? ***  Fatigable weakness?*** Does strength improve after brief exercise?***  Able to brush hair/teeth without difficulty? *** Able to button shirts/use zips? *** Clumsiness/dropping grasped objects?*** Can you arise from squatted position easily? *** Able to get out of chair without using arms? *** Able to walk up steps easily? *** Use an assistive device to walk? *** Significant imbalance with walking? *** Falls?*** Any change in urine color, especially after exertion/physical activity? ***  The patient denies*** symptoms suggestive of oculobulbar weakness including diplopia, ptosis, dysphagia, poor saliva control, dysarthria/dysphonia, impaired mastication, facial weakness/droop.  There are no*** neuromuscular respiratory weakness symptoms, particularly orthopnea>dyspnea.   Pseudobulbar affect is absent***.  The patient does not*** report symptoms referable to autonomic dysfunction including impaired sweating, heat or cold intolerance, excessive mucosal dryness, gastroparetic early satiety, postprandial abdominal bloating, constipation, bowel or bladder dyscontrol, erectile dysfunction*** or syncope/presyncope/orthostatic intolerance.  There are no*** complaints relating to other symptoms of small fiber modalities including paresthesia/pain.  The patient has not *** noticed any recent skin rashes nor does he*** report any constitutional  symptoms like  fever, night sweats, anorexia or unintentional weight loss.  EtOH use: ***  Restrictive diet? *** Family history of neuropathy/myopathy/NM disease?***  Previous labs, electrodiagnostics, and neuroimaging are summarized below, but pertinent findings include***  Any biopsy done? *** Current medications being tried for the patient's symptoms include ***  Prior medications that have been tried: ***   MEDICATIONS:  Outpatient Encounter Medications as of 03/11/2023  Medication Sig   albuterol (VENTOLIN HFA) 108 (90 Base) MCG/ACT inhaler INHALE 2 PUFFS INTO THE LUNGS EVERY 4 (FOUR) HOURS AS NEEDED FOR WHEEZING OR SHORTNESS OF BREATH.   [name] 150 MG capsule TAKE 4 CAPSULES BY MOUTH TWICE DAILY WITH FOOD. SWALLOW CAPSULES WHOLE. DO NOT OPEN OR CRUSH. STORE IN ORIGINAL CONTAINER   amLODipine (NORVASC) 2.5 MG tablet Take 2.5 mg by mouth daily. (Patient not taking: Reported on 02/09/2023)   atorvastatin (LIPITOR) 20 MG tablet Take 20 mg by mouth daily.   B Complex Vitamins (B COMPLEX PO) Take 1 tablet by mouth daily.   levothyroxine (SYNTHROID) 100 MCG tablet TAKE 1 TABLET BY MOUTH DAILY BEFORE BREAKFAST.   Multiple Vitamin (MULTIVITAMIN) tablet Take 1 tablet by mouth daily.   TRELEGY ELLIPTA 100-62.5-25 MCG/INH AEPB Inhale 1 puff into the lungs daily.   venlafaxine (EFFEXOR) 37.5 MG tablet Take 37.5 mg by mouth daily.    No facility-administered encounter medications on file as of 03/11/2023.    PAST MEDICAL HISTORY: Past Medical History:  Diagnosis Date   COPD (chronic obstructive pulmonary disease) (HCC)    Diabetes mellitus without complication (HCC)    Hypertension    nscl ca dx'd 08/2018   Pneumonia    Subarachnoid hemorrhage (HCC)     PAST SURGICAL HISTORY: Past Surgical History:  Procedure Laterality Date   INGUINAL HERNIA REPAIR Right 06/24/2020   Procedure: RIGHT OPEN INGUINAL HERNIA REPAIR WITH MESH;  Surgeon: Kinsinger, De Blanch, MD;  Location: [facility];  Service:  General;  Laterality: Right;   IR IVC FILTER PLMT / S&I Lenise Arena GUID/MOD SED  03/04/2020   IR IVC FILTER RETRIEVAL / S&I Lenise Arena GUID/MOD SED  08/15/2020   IR RADIOLOGIST EVAL & MGMT  08/06/2020   LEG SURGERY  age 41    left leg, from fracture    ALLERGIES: No Known Allergies  FAMILY HISTORY: Family History  Problem Relation Age of Onset   Colon cancer Neg Hx    Stomach cancer Neg Hx    Thyroid cancer Neg Hx     SOCIAL HISTORY: Social History   Tobacco Use   Smoking status: Never   Smokeless tobacco: Never  Vaping Use   Vaping status: Never Used  Substance Use Topics   Alcohol use: Yes    Alcohol/week: 5.0 standard drinks of alcohol    Types: 5 Standard drinks or equivalent per week    Comment: occasionally   Drug use: No   Social History   Social History Narrative   Not on file     OBJECTIVE: PHYSICAL EXAM: There were no vitals taken for this visit.  General:*** General appearance: Awake and alert. No distress. Cooperative with exam.  Skin: No obvious rash or jaundice. HEENT: Atraumatic. Anicteric. Lungs: Non-labored breathing on room air  Heart: Regular Abdomen: Soft, non tender. Extremities: No edema. No obvious deformity.  Musculoskeletal: No obvious joint swelling. Psych: Affect appropriate.  Neurological: Mental Status: Alert. Speech fluent. No pseudobulbar affect Cranial Nerves: CNII: No RAPD. Visual fields grossly intact. CNIII, IV, VI: PERRL. No nystagmus. EOMI. CN V: Facial sensation intact  bilaterally to fine touch. [place] clench strong. Jaw jerk***. CN VII: Facial muscles symmetric and strong. No ptosis at rest or after sustained upgaze***. CN VIII: Hearing grossly intact bilaterally. CN IX: No hypophonia. CN X: Palate elevates symmetrically. CN XI: Full strength shoulder shrug bilaterally. CN XII: Tongue protrusion full and midline. No atrophy or fasciculations. No significant dysarthria*** Motor: Tone is ***. *** fasciculations in ***  extremities. *** atrophy. No grip or percussive myotonia.***  Individual muscle group testing (MRC grade out of 5):  Movement     Neck flexion ***    Neck extension ***     Right Left   Shoulder abduction *** ***   Shoulder adduction *** ***   Shoulder ext rotation *** ***   Shoulder int rotation *** ***   Elbow flexion *** ***   Elbow extension *** ***   Wrist extension *** ***   Wrist flexion *** ***   Finger abduction - FDI *** ***   Finger abduction - ADM *** ***   Finger extension *** ***   Finger distal flexion - 2/3 *** ***   Finger distal flexion - 4/5 *** ***   Thumb flexion - FPL *** ***   Thumb abduction - APB *** ***    Hip flexion *** ***   Hip extension *** ***   Hip adduction *** ***   Hip abduction *** ***   Knee extension *** ***   Knee flexion *** ***   Dorsiflexion *** ***   Plantarflexion *** ***   Inversion *** ***   Eversion *** ***   Great toe extension *** ***   Great toe flexion *** ***     Reflexes:  Right Left   Bicep *** ***   Tricep *** ***   BrRad *** ***   Knee *** ***   Ankle *** ***    Pathological Reflexes: Babinski: *** response bilaterally*** Hoffman: *** Troemner: *** Pectoral: *** Palmomental: *** Facial: *** Midline tap: *** Sensation: Pinprick: *** Vibration: *** Temperature: *** Proprioception: *** Coordination: Intact finger-to- nose-finger bilaterally. Romberg negative.*** Gait: Able to rise from chair with arms crossed unassisted. Normal, narrow-based gait. Able to tandem walk. Able to walk on toes and heels.***  Lab and Test Review: Internal labs: TSH (11/06/22): elevated to 6.447  External labs: 01/27/23: CMP significant for mildly elevated glucose (112), Tbili (1.1), and Alk phos (124) TSH wnl HbA1c: 5.8 Lipid panel: total cholesterol 151, TG 75, LDL 62  B12 (10/20/21): 921  Imaging: External EMG (02/12/23 - cannot see table only this note): Brian [name] is a 62 y.o. year old male with BUE  pain and paresthesias (R > L) referred for electrodiagnostics to evaluate for peripheral nerve entrapment versus brachial plexopathy versus cervical radiculopathy. Patient does have a history of lung cancer s/p chemotherapy and radiation to the right upper lung field and right neck region. He also has a history of T2DM. Due to the severity of the findings, testing was limited to the right upper extremity today. He will come back next week for left upper extremity testing. 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 There is evidence of acute denervation in all right upper extremity muscles tested. This is consistent with the patient's clinical history of radiation. Due to the above plexopathy and diffuse findings in the right upper extremity on EMG, cannot rule out a more diffuse  process such as motor neuron disease. Will bring patient back in to test for the left upper extremity next week. While the median nerve conduction studies were abnormal, this may be attributed to the more proximal plexopathy, but cannot definitively rule out a concomitant median neuropathy.   External EMG (02/17/23 - cannot see table only this note): Brian [name] is a 62 y.o. year old male referred for electrodiagnostics, findings consistent with: Acute on chronic left C8 radiculopathy. Severe left-sided carpal tunnel syndrome. Left-sided cubital tunnel syndrome.   MRI brain w/wo contrast (10/15/22): FINDINGS: Brain:   No age advanced or lobar predominant parenchymal atrophy.   Multifocal T2 FLAIR hyperintense signal abnormality within the cerebral white matter, nonspecific but compatible with mild chronic small vessel ischemic disease.   Small chronic infarct within the right cerebellar hemisphere   There is no acute infarct.   No evidence of an intracranial  mass.   No chronic intracranial blood products.   No extra-axial fluid collection.   No midline shift.   No pathologic intracranial enhancement identified.   Vascular: Maintained flow voids within the proximal large arterial vessels.   Skull and upper cervical spine: No focal suspicious marrow lesion.   Sinuses/Orbits: No mass or acute finding within the imaged orbits. Partial T2 hyperintense opacification of the right frontal sinus and of an anterior right ethmoid air cell. Mild mucosal thickening scattered elsewhere within bilateral ethmoid air cells. Mild mucosal thickening within the bilateral maxillary sinuses.   IMPRESSION: 1. No evidence of intracranial metastatic disease. 2. Mild chronic small vessel ischemic changes within the cerebral white matter, similar to the prior brain MRI of 10/09/2021. 3. 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 4. Paranasal sinus disease, as described  MRI right brachial plexus (07/30/22): FINDINGS: Spinal cord   Visualized spinal cord demonstrates normal signal.   Brachial plexus:   Appears normal. No mass impinging on the brachial plexus is identified. No neurogenic tumor is identified.   Muscles and tendons   Normal signal throughout without evidence of denervation atrophy.   Bones   No fracture or worrisome lesion is seen. Small degenerative cyst in the right humeral head is noted.   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 The glenohumeral joint appears normal.   Other findings   Moderately large right pleural effusion appears increased in size compared to the prior chest CT.   IMPRESSION: Normal appearing right brachial plexus.   Moderately large right pleural effusion appears increased in size compared to the patient's 03/30/2022 chest CT.   Acromioclavicular osteoarthritis.  MRI cervical spine wo contrast  (07/30/22): FINDINGS: Alignment: Physiologic.   Vertebrae: No fracture, evidence of discitis, or bone lesion.   Cord: Normal signal and morphology.   Posterior Fossa, vertebral arteries, paraspinal tissues: Negative.   Disc levels:   C2-C3: No significant disc bulge. Mild facet arthropathy. No spinal canal stenosis or neural foraminal narrowing.   C3-C4: No significant disc bulge. Left-greater-than-right facet arthropathy. No spinal canal stenosis or neural foraminal narrowing.   C4-C5: No significant disc bulge. Left-greater-than-right facet arthropathy. No spinal canal stenosis or neural foraminal narrowing.   C5-C6: Minimal disc bulge with right paracentral and subarticular disc protrusion, which indents the thecal sac but does not deform the spinal cord. Facet and uncovertebral hypertrophy. No spinal canal stenosis. Mild right neural foraminal narrowing.   C6-C7: No significant disc bulge. No spinal canal stenosis or neuroforaminal narrowing.   C7-T1: No significant disc bulge.  No spinal canal stenosis or neuroforaminal narrowing.   IMPRESSION: 1. C5-C6 mild right neural foraminal narrowing. 2. Multilevel facet arthropathy, which can be a cause of pain. ***  ASSESSMENT: Brian [name] is a 62 y.o. male who presents for evaluation of ***. *** has a relevant medical history of ***. *** neurological examination is pertinent for ***. Available diagnostic data is significant for ***. This constellation of symptoms and objective data would most likely localize to ***. ***  PLAN: -Blood work: *** ***  -Return to clinic ***  The impression above as well as the plan as outlined below were extensively discussed with the patient (in the company of ***) who voiced understanding. All questions were answered to their satisfaction.  The patient was counseled on pertinent fall precautions per the printed material provided today, and as noted under the "Patient Instructions"  section below.***  When available, results of the above investigations and possible further recommendations will be communicated to the patient via telephone/MyChart. Patient to call office if not contacted after expected testing turnaround time.   Total time spent reviewing records, interview, history/exam, documentation, and coordination of care on day of encounter:  *** min   Thank you for allowing me to participate in patient's care.  If I can answer any additional questions, I would be pleased to do so.  Jacquelyne Balint, MD   CC: [name], MD [address] [place] [address]  CC: Referring provider: Marvel Plan, MD [facility] [address] [place],  [place] [address]

## 2023-03-11 ENCOUNTER — Ambulatory Visit: Payer: BC Managed Care – PPO | Admitting: Neurology

## 2023-03-11 NOTE — Progress Notes (Signed)
Initial neurology clinic note  Reason for Evaluation: Consultation requested by Rulon Eisenmenger* for an opinion regarding upper extremity pain and paresthesias. My final recommendations will be communicated back to the requesting physician by way of shared medical record or letter to requesting physician via [place] mail.  HPI: This is [name], a 62 y.o. right-handed male with a medical history of diabetes, hypothyroidism, lung cancer s/p chemo and radiation, emphysema, SAH, and HLD who presents to neurology clinic with the chief complaint of upper extremity pain and paresthesias. The patient is accompanied by wife.  Patient has pain starting at the base of the neck that radiates mostly into the right arm. He has no use in his 4th and 5th digits. He has a lot of pain in the neck, shoulder, into the elbow, and into the arm. The pain is very severe and worse at night. In the left arm patient has some numbness and pain, most in the forearm. He feels like it is a bad sunburn. He has muscle atrophy and is getting progressively worse. Patient thinks symptoms started in 06/2022 and were mild. Symptoms became severe in 09/2022. The pain is so bad currently that he sleeps only a couple hours per night.  He denies similar symptoms in the past.  He endorses muscle cramps in right triceps mainly. He endorses some fasciculations in his arms. He denies significant low back pain or symptoms in his legs.  The patient denies symptoms suggestive of oculobulbar weakness including diplopia, ptosis, dysphagia, poor saliva control, dysarthria/dysphonia, impaired mastication, facial weakness/droop.  There are no neuromuscular respiratory weakness symptoms, particularly orthopnea>dyspnea.   Pseudobulbar affect is absent.  He does not report any constitutional symptoms like fever, night sweats, anorexia or unintentional weight loss. He does report 10 lbs of weight loss but this is intentional.  EtOH  use: 2-3 beers a month  Restrictive diet? No Family history of neuropathy/myopathy/neurologic disease? No  Patient has seen spine and maybe saw neurology in [facility]. He has also been seen at the hand center. He saw PM&R at [facility] and had EMG on 02/12/23 and 02/17/23 that showed right brachial plexopathy (per notes: upper trunk brachial pan plexopathy in the right upper extremity predominantly in the lower trunk distribution). Patient states that he was told he had carpal tunnel and ulnar neuropathy, which is what the LUE report mentioned.  Patient has been prescribed gabapentin 1200 mg at night (sometimes will take 1800 mg). He is not sure it is working. He denies any side effects. He also has baclofen 10 mg TID, but does not really take this. His pain is so bad though, he does not have good relief. He also uses ice and heat. He also had a steroid shot and Toradol that only helped about 2 hours.  Oncology history per Dr. Asa Lente oncology clinic note (12/07/22):  Metastatic non-small cell lung cancer initially diagnosed as stage IIIB (T1c, N3, M0) non-small cell lung cancer, adenocarcinoma presented with right upper lobe lung mass in addition to mediastinal and bilateral supraclavicular lymphadenopathy diagnosed in January 2020.  He had evidence of disease recurrence in June 2021 with adenopathy in the subcarinal, level 3 left neck lymph node, and lymph node at the curious of the diaphragm and in the upper abdomen.   PRIOR THERAPY: 1) Concurrent chemoradiation with weekly carboplatin for AUC of 2 and paclitaxel 45 mg/M2.  Status post 6 cycles.  Last dose was given on October 10, 2018 with stable disease. 2)  Radiation treatment to the enlarging right cervical lymph nodes under the care of Dr. Roselind Messier. First treatment 03/06/2019. Last treatment scheduled on 04/13/2019 3) Consolidation treatment with immunotherapy with Imfinzi 10 mg/KG every 2 weeks.  First dose November 17, 2018.  Status post 26 cycles. 4)  Systemic chemotherapy with carboplatin for an AUC of 5, Alimta 500 mg/m2, and Keytruda 200 mg IV every 3 weeks. First dose expected on 02/14/20. Status post 1 cycle.  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 5) SBRT to the left lower lobe lung nodule.   CURRENT THERAPY:  [name] ([name]) 600 mg p.o. twice daily.  He started the first dose on March 08, 2020.   Status post 33 months of treatment.  Per patient, he is currently in remission.   MEDICATIONS:  Outpatient Encounter Medications as of 03/17/2023  Medication Sig   albuterol (VENTOLIN HFA) 108 (90 Base) MCG/ACT inhaler INHALE 2 PUFFS INTO THE LUNGS EVERY 4 (FOUR) HOURS AS NEEDED FOR WHEEZING OR SHORTNESS OF BREATH.   [name] 150 MG capsule TAKE 4 CAPSULES BY MOUTH TWICE DAILY WITH FOOD. SWALLOW CAPSULES WHOLE. DO NOT OPEN OR CRUSH. STORE IN ORIGINAL CONTAINER   amLODipine (NORVASC) 2.5 MG tablet Take 2.5 mg by mouth daily.   atorvastatin (LIPITOR) 20 MG tablet Take 20 mg by mouth daily.   B Complex Vitamins (B COMPLEX PO) Take 1 tablet by mouth daily.   baclofen (LIORESAL) 10 MG tablet Take 10 mg by mouth 3 (three) times daily.   gabapentin (NEURONTIN) 600 MG tablet Take 1,200 mg by mouth at bedtime.   levothyroxine (SYNTHROID) 100 MCG tablet TAKE 1 TABLET BY MOUTH DAILY BEFORE BREAKFAST.   TRELEGY ELLIPTA 100-62.5-25 MCG/INH AEPB Inhale 1 puff into the lungs daily.   venlafaxine (EFFEXOR) 37.5 MG tablet Take 37.5 mg by mouth daily.   Multiple Vitamin (MULTIVITAMIN) tablet Take 1 tablet by mouth daily. (Patient not taking: Reported on 03/17/2023)   No facility-administered encounter medications on file as of 03/17/2023.    PAST MEDICAL HISTORY: Past Medical History:  Diagnosis Date   COPD (chronic obstructive pulmonary disease) (HCC)    Diabetes mellitus without complication (HCC)    Hypertension    nscl ca dx'd 08/2018   Pneumonia     Subarachnoid hemorrhage (HCC)     PAST SURGICAL HISTORY: Past Surgical History:  Procedure Laterality Date   INGUINAL HERNIA REPAIR Right 06/24/2020   Procedure: RIGHT OPEN INGUINAL HERNIA REPAIR WITH MESH;  Surgeon: Kinsinger, De Blanch, MD;  Location: [facility];  Service: General;  Laterality: Right;   IR IVC FILTER PLMT / S&I Lenise Arena GUID/MOD SED  03/04/2020   IR IVC FILTER RETRIEVAL / S&I Lenise Arena GUID/MOD SED  08/15/2020   IR RADIOLOGIST EVAL & MGMT  08/06/2020   LEG SURGERY  age 63    left leg, from fracture    ALLERGIES: No Known Allergies  FAMILY HISTORY: Family History  Problem Relation Age of Onset   Diabetes Mother    Diabetes Brother    Colon cancer Neg Hx    Stomach cancer Neg Hx    Thyroid cancer Neg Hx     SOCIAL HISTORY: Social History   Tobacco Use   Smoking status: Never   Smokeless tobacco: Never  Vaping Use   Vaping status: Never Used  Substance Use Topics   Alcohol use: Yes    Alcohol/week: 5.0 standard drinks of alcohol  Types: 5 Standard drinks or equivalent per week    Comment: occasionally   Drug use: No   Social History   Social History Narrative   Are you right handed or left handed? Right   Are you currently employed ?    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   Who lives with you? wife   What type of home do you live in: 1 story or 2 story? Two    Caffeine  3 cups a day      OBJECTIVE: PHYSICAL EXAM: BP 103/71   Pulse 85   Ht 5\' 8"  (1.727 m)   Wt 184 lb (83.5 kg)   SpO2 98%   BMI 27.98 kg/m   General: General appearance: Awake and alert. Tired appearing. No distress. Cooperative with exam.  Skin: No obvious rash or jaundice. HEENT: Atraumatic. Anicteric. Lungs: Non-labored breathing on room air  Extremities: No edema. No obvious deformity.  Musculoskeletal: No obvious joint swelling. Psych: Affect appropriate.  Neurological: Mental Status: Alert. Speech fluent. No pseudobulbar affect Cranial  Nerves: CNII: No RAPD. Visual fields grossly intact. CNIII, IV, VI: PERRL. No nystagmus. EOMI. CN V: Facial sensation intact bilaterally to fine touch. CN VII: Facial muscles symmetric and strong, specifically orbicularis oris and orbicularis oculi full strength. No ptosis at rest. CN VIII: Hearing grossly intact bilaterally. CN IX: No hypophonia. CN X: Palate elevates symmetrically. CN XI: Full strength shoulder shrug bilaterally. CN XII: Tongue protrusion full and midline. No atrophy or fasciculations. No significant dysarthria Motor: Tone is normal. Mild right scapular winging with activation of serratus anterior. No fasciculations seen in any extremities. Atrophy of bilateral forearms and intrinsic hand muscles (right > left).  Individual muscle group testing (MRC grade out of 5):  Movement     Neck flexion 5    Neck extension 5     Right Left   Shoulder abduction 5- 5   Shoulder adduction 5 5   Shoulder ext rotation 4 5   Shoulder int rotation 4+ 5   Elbow flexion 5- 5   Elbow extension 4- 5   Wrist extension 5 5   Wrist flexion 4 5   Finger abduction - FDI 3 4+   Finger abduction - ADM 3 4+   Finger extension 3 4+   Finger distal flexion - 2/3 3 4+   Finger distal flexion - 4/5 3 5-   Thumb flexion - FPL 3 4+   Thumb abduction - APB 3 4    Hip flexion 5 5   Hip extension 5 5   Hip adduction 5 5   Hip abduction 5 5   Knee extension 5 5   Knee flexion 5 5   Dorsiflexion 5 5   Plantarflexion 5 5     Reflexes:  Right Left   Bicep 2+ 3+   Tricep 1+ 2+   BrRad 0 3+   Knee 2+ 3+   Ankle 2+ 2+    Pathological Reflexes: Babinski: extensor response on left? Flexor on right Hoffman: absent on right, ?positive on left Troemner: absent bilaterally Pectoral: absent bilaterally Sensation: Pinprick: Intact in lower extremities. Diminished to 50% in right deltoid area and 40% of normal in right medial forearm, otherwise intact Coordination: Intact finger-to-  nose-finger bilaterally. Romberg negative. Gait: Able to rise from chair with arms crossed unassisted. Normal, narrow-based gait. Able to tandem walk. Able to walk on toes and heels.  Lab and Test  Review: Internal labs: TSH (11/06/22): elevated to 6.447  External labs: 01/27/23: CMP significant for mildly elevated glucose (112), Tbili (1.1), and Alk phos (124) TSH wnl HbA1c: 5.8 Lipid panel: total cholesterol 151, TG 75, LDL 62  B12 (10/20/21): 921  Imaging: External EMG (02/12/23 - cannot see table only this note): Mcadoo Liverpool is a 62 y.o. year old male with BUE pain and paresthesias (R > L) referred for electrodiagnostics to evaluate for peripheral nerve entrapment versus brachial plexopathy versus cervical radiculopathy. Patient does have a history of lung cancer s/p chemotherapy and radiation to the right upper lung field and right neck region. He also has a history of T2DM. Due to the severity of the findings, testing was limited to the right upper extremity today. He will come back next week for left upper extremity testing. 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 There is evidence of acute denervation in all right upper extremity muscles tested. This is consistent with the patient's clinical history of radiation. Due to the above plexopathy and diffuse findings in the right upper extremity on EMG, cannot rule out a more diffuse process such as motor neuron disease. Will bring patient back in to test for the left upper extremity next week. While the median nerve conduction studies were abnormal, this may be attributed to the more proximal plexopathy, but cannot definitively rule out a concomitant median neuropathy.   External EMG (02/17/23 - cannot see table only this note): Jodey Geske is a 62 y.o. year old male referred  for electrodiagnostics, findings consistent with: Acute on chronic left C8 radiculopathy. Severe left-sided carpal tunnel syndrome. Left-sided cubital tunnel syndrome.   MRI brain w/wo contrast (10/15/22): FINDINGS: Brain:   No age advanced or lobar predominant parenchymal atrophy.   Multifocal T2 FLAIR hyperintense signal abnormality within the cerebral white matter, nonspecific but compatible with mild chronic small vessel ischemic disease.   Small chronic infarct within the right cerebellar hemisphere   There is no acute infarct.   No evidence of an intracranial mass.   No chronic intracranial blood products.   No extra-axial fluid collection.   No midline shift.   No pathologic intracranial enhancement identified.   Vascular: Maintained flow voids within the proximal large arterial vessels.   Skull and upper cervical spine: No focal suspicious marrow lesion.   Sinuses/Orbits: No mass or acute finding within the imaged orbits. Partial T2 hyperintense opacification of the right frontal sinus and of an anterior right ethmoid air cell. Mild mucosal thickening scattered elsewhere within bilateral ethmoid air cells. Mild mucosal thickening within the bilateral maxillary sinuses.   IMPRESSION: 1. No evidence of intracranial metastatic disease. 2. Mild chronic small vessel ischemic changes within the cerebral white matter, similar to the prior brain MRI of 10/09/2021. 3. 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 4. Paranasal sinus disease, as described  MRI right brachial plexus (07/30/22): FINDINGS: Spinal cord   Visualized spinal cord demonstrates normal signal.   Brachial plexus:   Appears normal. No mass impinging on the brachial plexus is identified. No neurogenic tumor is identified.   Muscles and tendons   Normal signal throughout without evidence of denervation atrophy.   Bones   No fracture or worrisome lesion is  seen. Small degenerative cyst in the right humeral head is noted.   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 The glenohumeral joint appears normal.   Other findings   Moderately large right pleural effusion appears increased in size compared to the prior chest CT.   IMPRESSION: Normal appearing right brachial plexus.   Moderately large right pleural effusion appears increased in size compared to the patient's 03/30/2022 chest CT.   Acromioclavicular osteoarthritis.  MRI cervical spine wo contrast (07/30/22): FINDINGS: Alignment: Physiologic.   Vertebrae: No fracture, evidence of discitis, or bone lesion.   Cord: Normal signal and morphology.   Posterior Fossa, vertebral arteries, paraspinal tissues: Negative.   Disc levels:   C2-C3: No significant disc bulge. Mild facet arthropathy. No spinal canal stenosis or neural foraminal narrowing.   C3-C4: No significant disc bulge. Left-greater-than-right facet arthropathy. No spinal canal stenosis or neural foraminal narrowing.   C4-C5: No significant disc bulge. Left-greater-than-right facet arthropathy. No spinal canal stenosis or neural foraminal narrowing.   C5-C6: Minimal disc bulge with right paracentral and subarticular disc protrusion, which indents the thecal sac but does not deform the spinal cord. Facet and uncovertebral hypertrophy. No spinal canal stenosis. Mild right neural foraminal narrowing.   C6-C7: No significant disc bulge. No spinal canal stenosis or neuroforaminal narrowing.   C7-T1: No significant disc bulge. No spinal canal stenosis or neuroforaminal narrowing.   IMPRESSION: 1. C5-C6 mild right neural foraminal narrowing. 2. Multilevel facet arthropathy, which can be a cause of pain.   ASSESSMENT: Brian [name] is a 62 y.o. male who presents for evaluation of neck pain, shoulder pain, and bilateral arm pain and weakness (right more than left).  He has a relevant medical history of diabetes, hypothyroidism, lung cancer s/p chemo and radiation, emphysema, SAH, and HLD. His neurological examination is pertinent for atrophy of bilateral forearms and intrinsic hand muscles with weakness in bilateral arms, right greater than left. Interestingly, he has hyperreflexia in LUE, though this could be chronic due to old infarct seen on MRI. His previous EMG of the RUE suggested an active brachial plexopathy and EMG of LUE was more confusing but active C8 radiculopathy, CTS, and ulnar at the elbow. While this is possible, I wonder if the LUE symptoms are also plexus related. He has a history of lung cancer and radiation, so a radiation plexopathy is possible though with this significant amount of pain, neuralgic amyotrophy unrelated to radiation is also possible.   PLAN: -Blood work: B6 -Repeat EMG - bilateral arms (brachial plexus protocol) -OT for arm weakness -Pain control:  -Wean off venlafaxine: take 1 tablet every other day for 1 week then stop  -Start Cymbalta 60 mg daily after stopping venlafaxine  -Change gabapentin 600 mg midday and 1800 mg at night  -Lidocaine cream PRN  -Return to clinic in 1 month  The impression above as well as the plan as outlined below were extensively discussed with the patient (in the company of wife) who voiced understanding. All questions were answered to their satisfaction.  When available, results of the above investigations and possible further recommendations will be communicated to the patient via telephone/MyChart. Patient to call office if not contacted after expected testing turnaround time.   Total time spent reviewing records, interview, history/exam, documentation, and coordination of care on day of encounter:  90 min   Thank you for allowing me to participate in patient's care.  If I can answer any additional questions, I would be pleased to do so.  Jacquelyne Balint, MD   CC: [name],  MD [address] [place] [address]  CC: Referring provider: Marvel Plan, MD [facility] [address] [place],  [place] [address]

## 2023-03-17 ENCOUNTER — Encounter: Payer: Self-pay | Admitting: Neurology

## 2023-03-17 ENCOUNTER — Ambulatory Visit: Payer: BC Managed Care – PPO | Admitting: Neurology

## 2023-03-17 ENCOUNTER — Other Ambulatory Visit (INDEPENDENT_AMBULATORY_CARE_PROVIDER_SITE_OTHER): Payer: BC Managed Care – PPO

## 2023-03-17 ENCOUNTER — Ambulatory Visit (INDEPENDENT_AMBULATORY_CARE_PROVIDER_SITE_OTHER): Payer: BC Managed Care – PPO | Admitting: Neurology

## 2023-03-17 VITALS — BP 103/71 | HR 85 | Ht 68.0 in | Wt 184.0 lb

## 2023-03-17 DIAGNOSIS — R29898 Other symptoms and signs involving the musculoskeletal system: Secondary | ICD-10-CM

## 2023-03-17 DIAGNOSIS — M79601 Pain in right arm: Secondary | ICD-10-CM

## 2023-03-17 DIAGNOSIS — M6259 Muscle wasting and atrophy, not elsewhere classified, multiple sites: Secondary | ICD-10-CM

## 2023-03-17 DIAGNOSIS — C3491 Malignant neoplasm of unspecified part of right bronchus or lung: Secondary | ICD-10-CM

## 2023-03-17 DIAGNOSIS — M792 Neuralgia and neuritis, unspecified: Secondary | ICD-10-CM | POA: Diagnosis not present

## 2023-03-17 DIAGNOSIS — G540 Brachial plexus disorders: Secondary | ICD-10-CM

## 2023-03-17 DIAGNOSIS — M79602 Pain in left arm: Secondary | ICD-10-CM

## 2023-03-17 MED ORDER — DULOXETINE HCL 60 MG PO CPEP
60.0000 mg | ORAL_CAPSULE | Freq: Every day | ORAL | 3 refills | Status: DC
Start: 2023-03-17 — End: 2024-02-17

## 2023-03-17 MED ORDER — GABAPENTIN 600 MG PO TABS
ORAL_TABLET | ORAL | 5 refills | Status: DC
Start: 2023-03-17 — End: 2023-03-30

## 2023-03-17 NOTE — Addendum Note (Signed)
Addended by: Lenise Herald on: 03/17/2023 01:48 PM   Modules accepted: Orders

## 2023-03-17 NOTE — Patient Instructions (Addendum)
I saw you today for pain and arm weakness.  I think your symptoms may be due to radiation damage to your brachial plexus. I would like to investigate further with the following: -Blood work today -Repeat of your EMG (see information below)  I would like to send you to occupational therapy to help strengthen your arms.  For pain:  -Wean off venlafaxine: take 1 tablet every other day for 1 week then stop  -Start Cymbalta 60 mg daily after stopping venlafaxine  -Change gabapentin 600 mg midday and 1800 mg at night  -Lidocaine cream PRN  I will see you back in clinic in 1 month. Please let me know if you have any questions or concerns in the meantime.   The physicians and staff at [facility] are committed to providing excellent care. You may receive a survey requesting feedback about your experience at our office. We strive to receive "very good" responses to the survey questions. If you feel that your experience would prevent you from giving the office a "very good " response, please contact our office to try to remedy the situation. We may be reached at [phone]. Thank you for taking the time out of your busy day to complete the survey.  Jacquelyne Balint, MD [facility]  ELECTROMYOGRAM AND NERVE CONDUCTION STUDIES (EMG/NCS) INSTRUCTIONS  How to Prepare The neurologist conducting the EMG will need to know if you have certain medical conditions. Tell the neurologist and other EMG lab personnel if you: Have a pacemaker or any other electrical medical device Take blood-thinning medications Have hemophilia, a blood-clotting disorder that causes prolonged bleeding Bathing Take a shower or bath shortly before your exam in order to remove oils from your skin. Don't apply lotions or creams before the exam.  What to Expect You'll likely be asked to change into a hospital gown for the procedure and lie down on an examination table. The following explanations can help you understand what  will happen during the exam.  Electrodes. The neurologist or a technician places surface electrodes at various locations on your skin depending on where you're experiencing symptoms. Or the neurologist may insert needle electrodes at different sites depending on your symptoms.  Sensations. The electrodes will at times transmit a tiny electrical current that you may feel as a twinge or spasm. The needle electrode may cause discomfort or pain that usually ends shortly after the needle is removed. If you are concerned about discomfort or pain, you may want to talk to the neurologist about taking a short break during the exam.  Instructions. During the needle EMG, the neurologist will assess whether there is any spontaneous electrical activity when the muscle is at rest - activity that isn't present in healthy muscle tissue - and the degree of activity when you slightly contract the muscle.  He or she will give you instructions on resting and contracting a muscle at appropriate times. Depending on what muscles and nerves the neurologist is examining, he or she may ask you to change positions during the exam.  After your EMG You may experience some temporary, minor bruising where the needle electrode was inserted into your muscle. This bruising should fade within several days. If it persists, contact your primary care doctor.

## 2023-03-19 ENCOUNTER — Encounter: Payer: Self-pay | Admitting: Neurology

## 2023-03-20 LAB — VITAMIN B6: Vitamin B6: 10.9 ng/mL (ref 2.1–21.7)

## 2023-03-22 ENCOUNTER — Telehealth: Payer: Self-pay | Admitting: Neurology

## 2023-03-22 ENCOUNTER — Telehealth: Payer: Self-pay

## 2023-03-22 ENCOUNTER — Other Ambulatory Visit: Payer: Self-pay

## 2023-03-22 DIAGNOSIS — M792 Neuralgia and neuritis, unspecified: Secondary | ICD-10-CM

## 2023-03-22 DIAGNOSIS — G540 Brachial plexus disorders: Secondary | ICD-10-CM

## 2023-03-22 NOTE — Telephone Encounter (Signed)
Order sent to [facility] needed no approval.

## 2023-03-22 NOTE — Telephone Encounter (Signed)
-----   Message from Antony Madura sent at 03/22/2023  8:38 AM EDT ----- Regarding: RE: MRI of bilateral brachial plexus Thank you.  Riki Rusk ----- Message ----- From: Lenise Herald, LPN Sent: 8/[id number]/9629   8:37 AM EDT To: Antony Madura, MD Subject: FW: MRI of bilateral brachial plexus           Sending to [facility] ----- Message ----- From: Antony Madura, MD Sent: 03/22/2023   7:43 AM EDT To: Lenise Herald, LPN Subject: MRI of bilateral brachial plexus               Jawanda Passey,  Can we order an MRI of the right and left brachial plexus with contrast? Patient says he does not want to go to [facility] though. I don't think he likes them.  Thank you,  Riki Rusk

## 2023-03-22 NOTE — Telephone Encounter (Signed)
Called [employer] at [phone], and talked to shirley H and she reported that it does not need a PA. 03/22/2023. 11:42am

## 2023-03-22 NOTE — Telephone Encounter (Signed)
Pt is calling in stating that he spoke with his insurance company and they stated that there is no block on his account that they can see. They asked him to see if we could give them a call back to get the MRI approved.

## 2023-03-23 ENCOUNTER — Other Ambulatory Visit: Payer: Self-pay

## 2023-03-23 ENCOUNTER — Telehealth: Payer: Self-pay | Admitting: Neurology

## 2023-03-23 DIAGNOSIS — M792 Neuralgia and neuritis, unspecified: Secondary | ICD-10-CM

## 2023-03-23 DIAGNOSIS — R29898 Other symptoms and signs involving the musculoskeletal system: Secondary | ICD-10-CM

## 2023-03-23 DIAGNOSIS — G540 Brachial plexus disorders: Secondary | ICD-10-CM

## 2023-03-23 DIAGNOSIS — M79601 Pain in right arm: Secondary | ICD-10-CM

## 2023-03-23 NOTE — Telephone Encounter (Signed)
Patient was calling to check on Status with Insurance

## 2023-03-23 NOTE — Telephone Encounter (Signed)
Called pt and let him know that the insurance needs no PA for the MRIs  and that  orders were sent to [facility].

## 2023-03-27 ENCOUNTER — Encounter: Payer: Self-pay | Admitting: Neurology

## 2023-03-30 ENCOUNTER — Telehealth: Payer: Self-pay

## 2023-03-30 ENCOUNTER — Other Ambulatory Visit: Payer: Self-pay

## 2023-03-30 ENCOUNTER — Telehealth: Payer: Self-pay | Admitting: Neurology

## 2023-03-30 ENCOUNTER — Encounter: Payer: Self-pay | Admitting: Occupational Therapy

## 2023-03-30 ENCOUNTER — Ambulatory Visit (INDEPENDENT_AMBULATORY_CARE_PROVIDER_SITE_OTHER): Payer: BC Managed Care – PPO | Admitting: Neurology

## 2023-03-30 ENCOUNTER — Ambulatory Visit: Payer: BC Managed Care – PPO | Attending: Neurology | Admitting: Occupational Therapy

## 2023-03-30 DIAGNOSIS — M79602 Pain in left arm: Secondary | ICD-10-CM | POA: Insufficient documentation

## 2023-03-30 DIAGNOSIS — M25641 Stiffness of right hand, not elsewhere classified: Secondary | ICD-10-CM | POA: Insufficient documentation

## 2023-03-30 DIAGNOSIS — M792 Neuralgia and neuritis, unspecified: Secondary | ICD-10-CM | POA: Insufficient documentation

## 2023-03-30 DIAGNOSIS — M79601 Pain in right arm: Secondary | ICD-10-CM

## 2023-03-30 DIAGNOSIS — R29898 Other symptoms and signs involving the musculoskeletal system: Secondary | ICD-10-CM

## 2023-03-30 DIAGNOSIS — R278 Other lack of coordination: Secondary | ICD-10-CM | POA: Insufficient documentation

## 2023-03-30 DIAGNOSIS — M6259 Muscle wasting and atrophy, not elsewhere classified, multiple sites: Secondary | ICD-10-CM

## 2023-03-30 DIAGNOSIS — G540 Brachial plexus disorders: Secondary | ICD-10-CM

## 2023-03-30 MED ORDER — GABAPENTIN 600 MG PO TABS
ORAL_TABLET | ORAL | 5 refills | Status: DC
Start: 2023-03-30 — End: 2023-10-18

## 2023-03-30 NOTE — Therapy (Signed)
OUTPATIENT OCCUPATIONAL THERAPY NEURO EVALUATION  Patient Name: Brian [name] MRN: [id number] DOB:[birth year], 62 y.o., male Today's Date: 03/30/2023  PCP: Keturah Barre REFERRING PROVIDER: Jacquelyne Balint  END OF SESSION:  OT End of Session - 03/30/23 1103     Visit Number 1    Number of Visits 25    Date for OT Re-Evaluation 06/28/23    Authorization Type BC[employer]  $1600 deductible has been met  20% coinsurance applies  $8500 oop, zero applied  VL combined with pt, ot, st: 60    OT Start Time 1010    OT Stop Time 1100    OT Time Calculation (min) 50 min    Activity Tolerance Patient limited by pain    Behavior During Therapy WFL for tasks assessed/performed             Past Medical History:  Diagnosis Date   COPD (chronic obstructive pulmonary disease) (HCC)    Diabetes mellitus without complication (HCC)    Hypertension    nscl ca dx'd 08/2018   Pneumonia    Subarachnoid hemorrhage (HCC)    Past Surgical History:  Procedure Laterality Date   INGUINAL HERNIA REPAIR Right 06/24/2020   Procedure: RIGHT OPEN INGUINAL HERNIA REPAIR WITH MESH;  Surgeon: Kinsinger, De Blanch, MD;  Location: [facility];  Service: General;  Laterality: Right;   IR IVC FILTER PLMT / S&I Lenise Arena GUID/MOD SED  03/04/2020   IR IVC FILTER RETRIEVAL / S&I Lenise Arena GUID/MOD SED  08/15/2020   IR RADIOLOGIST EVAL & MGMT  08/06/2020   LEG SURGERY  age 41    left leg, from fracture   Patient Active Problem List   Diagnosis Date Noted   Recurrent right pleural effusion 07/23/2021   Hypothyroidism 10/24/2020   Acute hypoxemic respiratory failure (HCC) 05/19/2020   Acute respiratory failure with hypoxia (HCC) 05/18/2020   Essential hypertension 05/18/2020   Type 2 diabetes mellitus with hyperlipidemia (HCC) 05/18/2020   S/P thoracentesis 03/21/2020   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/05/2020   Calf swelling 02/21/2020   Rash 02/07/2020   Adenocarcinoma of right lung, stage 4 (HCC) 02/07/2020    Chronic cough 12/20/2019   Venous thrombosis 09/21/2019   Shortness of breath 09/07/2019   Headache 08/09/2019   Lobar pneumonia, unspecified organism (HCC) 03/07/2019   Centrilobular emphysema (HCC) 03/07/2019   Encounter for antineoplastic immunotherapy 11/10/2018   Adenocarcinoma of right lung, stage 3 (HCC) 08/25/2018   Goals of care, counseling/discussion 08/25/2018   Encounter for antineoplastic chemotherapy 08/25/2018   Mass of upper lobe of right lung 08/04/2018   Subarachnoid hemorrhage (HCC) 05/22/2015    ONSET DATE: 03/17/23 Referral  REFERRING DIAG: UE Pain and paresthesias  THERAPY DIAG:  Pain in right arm  Other lack of coordination  Stiffness of right hand, not elsewhere classified  Pain in left arm  Rationale for Evaluation and Treatment: Rehabilitation  SUBJECTIVE:   SUBJECTIVE STATEMENT: Wants to feel better and regain use of RUE Pt accompanied by: self  PERTINENT HISTORY: Lung cancer, SAH 2016  PRECAUTIONS: None  WEIGHT BEARING RESTRICTIONS: No  PAIN:  Are you having pain? Yes: NPRS scale: 6/10 Pain location: right arm Pain description: sharp, stabbing, throbbing, aching Aggravating factors: idle - positional Relieving factors: heat, ice, gabapentin  FALLS: Has patient fallen in last 6 months? Yes. Number of falls 1- fell last night  LIVING ENVIRONMENT: Lives with: lives with their family and lives with their spouse Lives in: House/apartment Stairs: Yes: External: 2 steps; none  Has following equipment at home:  has built in shower seat  PLOF: Independent with basic ADLs  PATIENT GOALS: to be able to get back to some level of use of my right hand  OBJECTIVE:   HAND DOMINANCE: Right  ADLs: Overall ADLs: min assist and increased time Transfers/ambulation related to ADLs: Eating: min assist - wife assists with cutting.  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  Still driving - uses LUE only at this point Handwriting: unable   POSTURE COMMENTS:  forward head and posterior pelvic tilt   ACTIVITY TOLERANCE: Activity tolerance: reports more fatigue due to constant pain   UPPER EXTREMITY ROM:    Active ROM Right eval Left eval  Shoulder flexion 115 unable to maintain ext elbow 140  Shoulder abduction    Shoulder adduction    Shoulder extension    Shoulder internal rotation    Shoulder external rotation    Elbow flexion WFL   Elbow extension WFL   Wrist flexion    Wrist extension 30   Wrist ulnar deviation 10   Wrist radial deviation 5   Wrist pronation    Wrist supination    (Blank rows = not tested)  UPPER EXTREMITY MMT:     MMT Right eval Left eval  Shoulder flexion NT 4-/5  Shoulder abduction    Shoulder adduction    Shoulder extension    Shoulder internal rotation    Shoulder external rotation    Middle trapezius    Lower trapezius    Elbow flexion    Elbow extension    Wrist flexion 3   Wrist extension 3   Wrist ulnar deviation    Wrist radial deviation    Wrist pronation    Wrist supination    (Blank rows = not tested) RIGHT HAND - 15% COMPOSITE FLEXION     - 40% COMPOSITE EXTENSION HAND FUNCTION: Grip strength: Right: UNABLE lbs; Left: 31.9 lbs  COORDINATION: Finger Nose Finger test: Mild undershooting Left - right lacks strength/ proximal control to complete  SENSATION: WFL to testing, but reports heightened sensation - hypersensitive  EDEMA: Atrophy RUE - UPPER ARM, FOREARM  MUSCLE TONE: RUE: Moderate and Hypotonic and LUE: Within functional limits  COGNITION: Overall cognitive status: Within functional limits for tasks  assessed.  Reports some concentration deficits which he attributes to pain  VISION: Subjective report: no changes Baseline vision: Wears glasses all the time Visual history:  nothing significant  VISION ASSESSMENT: Not tested  Patient has difficulty with following activities due to following visual impairments: NA  PERCEPTION: WFL  PRAXIS: WFL   TODAY'S TREATMENT:  DATE: 8/27 Discussed potential sleeping positions, and support to reduce pain at night.     PATIENT EDUCATION: Education details: sleeping positions, benefits of aquatic therapy Person educated: Patient Education method: Explanation Education comprehension: verbalized understanding  HOME EXERCISE PROGRAM: TBD   GOALS: Goals reviewed with patient? Yes  SHORT TERM GOALS: Target date: 04/30/23  Patient will complete HEP designed to improve Bilateral shoulder range of motion  Goal status: INITIAL  2.  Patient will complete HEP designed to improve R elbow flex/ext strength  Goal status: INITIAL  3.  Patient will complete HEP designed to improve range of motion for composite flexion and extension in right hand/wrist  Goal status: INITIAL  4.  Patient will demonstrate ability to pick up and retrieve lightweight object from table with RUE - e.g. cell phone  Goal status: INITIAL  5.  Patient will report improved awareness of positional changes/ support techniques to reduce UE pain.    Goal status: INITIAL  6.  Patient will demonstrate 50% composite active flex/ext in right hand  Goal status: INITIAL  LONG TERM GOALS: Target date: 06/28/23  Patient will complete updated HEP designed on UE strengthening without increase in pain  Goal status: INITIAL  2.  Patient will demonstrate 10 lb grip strength R hand  Goal status: INITIAL  3.  Patient will demonstrate low reach RUE to  obtain and release a 2 lb object x 5 without dropping  Goal status: INITIAL  4.  Patient will be able to cut his own food with modified independence  Goal status: INITIAL  5.  Patient will be independent with aquatic exercise program addressing BUE active movement and strength  Goal status: INITIAL  6.  Patient will dress himself with modified independence  Goal status: INITIAL  ASSESSMENT:  CLINICAL IMPRESSION: 62 y.o. right-handed male with a medical history of diabetes, hypothyroidism, lung cancer s/p chemo and radiation, emphysema, SAH, and HLD who presents to neurology clinic with the chief complaint of upper extremity pain and paresthesias,  seen today for occupational therapy evaluation.  PERFORMANCE DEFICITS: in functional skills including dexterity, proprioception, sensation, tone, ROM, strength, pain, muscle spasms, Fine motor control, Gross motor control, endurance, and UE functional use  IMPAIRMENTS: are limiting patient from ADLs, IADLs, rest and sleep, work, and leisure.   CO-MORBIDITIES: may have co-morbidities  that affects occupational performance. Patient will benefit from skilled OT to address above impairments and improve overall function.  MODIFICATION OR ASSISTANCE TO COMPLETE EVALUATION: Min-Moderate modification of tasks or assist with assess necessary to complete an evaluation.  OT OCCUPATIONAL PROFILE AND HISTORY: Detailed assessment: Review of records and additional review of physical, cognitive, psychosocial history related to current functional performance.  CLINICAL DECISION MAKING: Moderate - several treatment options, min-mod task modification necessary  REHAB POTENTIAL: Good  EVALUATION COMPLEXITY: Moderate    PLAN:  OT FREQUENCY: 2x/week  OT DURATION: 12 weeks  PLANNED INTERVENTIONS: self care/ADL training, therapeutic exercise, therapeutic activity, neuromuscular re-education, manual therapy, passive range of motion, functional mobility  training, aquatic therapy, splinting, electrical stimulation, ultrasound, paraffin, fluidotherapy, moist heat, cryotherapy, contrast bath, patient/family education, and DME and/or AE instructions  RECOMMENDED OTHER SERVICES: may have potential for counseling - discussed with patient - he feels he is ok now, but will keep it in mind  CONSULTED AND AGREED WITH PLAN OF CARE: Patient  PLAN FOR NEXT SESSION: Aquatic therapy to address gentle motion / gentle resistance with less influence of gravity.  Clinic - discuss possible sleeping positions/ support,  start HEP for proxiam and distal movement RUE has greater need than LUE   [name] Salina, OT 03/30/2023, 11:26 AM

## 2023-03-30 NOTE — Procedures (Signed)
[facility]  [address] [place], [address]  [place], [place] [address] Tel: [phone] Fax: [phone] Test Date:  03/30/2023  Patient: Brian [name] DOB: Jun 24, 1961 Physician: Jacquelyne Balint  Sex: Male Height: 5\' 8"  Ref Phys:   ID#: [id number]   Technician:    History: This is a 62 year old male with bilateral upper limb pain and weakness.  NCV & EMG Findings: Extensive electrodiagnostic evaluation of bilateral upper limbs shows: Bilateral median antebrachial cutaneous (MAC) and right lateral antebrachial cutaneous (LAC) sensory responses are absent. Bilateral median, bilateral ulnar, and bilateral radial sensory responses show reduced amplitudes (see table below). Left LAC is present. Bilateral median (APB) motor responses show reduced amplitudes (right 1.17, left 4.0 mV). Right ulnar (ADM) motor response shows reduced amplitude (4.0 mV). Left ulnar (ADM) motor response is within normal limits, but borderline normal (7.3 mV). Chronic motor axon loss changes WITH accompanying active denervation changes are seen in the bilateral flexor pollicis longus, right first dorsal interosseous, right extensor inidicis proprius, abductor pollicis brevis, and triceps muscles. Chronic motor axon loss changes WITHOUT active denervation changes are seen in right biceps, right deltoid, right infraspinatus, left first dorsal interosseous, left extensor indicis proprius, and left abductor pollicis brevis muscles.  Myokymic discharges are seen in the right flexor pollicis longus, right biceps, and right infraspinatus muscles.  Impression: This is an abnormal study. 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 There are myokymic discharges in 3 muscles of the right upper limb, as may be seen in radiation plexopathy. Evidence of a left brachial plexopathy affecting the  lower trunk, moderate in degree electrically. An overlapping left C8-T1 radiculopathy cannot be completely excluded, though normal cervical paraspinal evaluation makes #2 more likely.    ___________________________ Jacquelyne Balint    Nerve Conduction Studies Motor Nerve Results    Latency Amplitude F-Lat Segment Distance CV Comment  Site (ms) Norm (mV) Norm (ms)  (cm) (m/s) Norm   Left Median (APB) Motor  Wrist 3.2  < 4.0 *4.0  > 6.0        Elbow 9.3 - 3.8 -  Elbow-Wrist 27.5 *45  > 50   Right Median (APB) Motor  Wrist 3.3  < 4.0 *1.17  > 6.0        Elbow 10.4 - 0.68 -  Elbow-Wrist 29 *41  > 50   Left Ulnar (ADM) Motor  Wrist 2.3  < 3.1 7.3  > 7.0 33.0       Bel elbow 6.9 - 7.1 -  Bel elbow-Wrist 22 *48  > 50   Ab elbow 9.0 - 6.5 -  Ab elbow-Bel elbow 10 48 -   Right Ulnar (ADM) Motor  Wrist 2.5  < 3.1 *4.0  > 7.0        Bel elbow 7.5 - 3.7 -  Bel elbow-Wrist 22.5 *45  > 50   Ab elbow 9.4 - 3.7 -  Ab elbow-Bel elbow 10 53 -    Sensory Sites    Neg Peak Lat Amplitude (O-P) Segment Distance Velocity Comment  Site (ms) Norm (V) Norm  (cm) (ms)   Left Lateral Antebrachial Cutaneous Sensory  Lat biceps-Lat forearm 3.1 - 6 - Lat biceps-Lat forearm 12    Right Lateral Antebrachial Cutaneous Sensory  Lat biceps-Lat forearm *NR - *NR - Lat biceps-Lat forearm 12    Left Medial Antebrachial Cutaneous Sensory  Elbow-Med  forearm *NR  < 3.2 *NR  > 5 Elbow-Med forearm 12    Right Medial Antebrachial Cutaneous Sensory  Elbow-Med forearm *NR  < 3.2 *NR  > 5 Elbow-Med forearm 12    Left Median Sensory  Wrist-Dig II 3.3  < 3.6 *7  > 15 Wrist-Dig II 13    Right Median Sensory  Wrist-Dig II 3.6  < 3.6 *4  > 15 Wrist-Dig II 13    Left Radial Sensory  Forearm-Wrist 2.0  < 2.7 *10  > 14 Forearm-Wrist 10    Right Radial Sensory  Forearm-Wrist 2.5  < 2.7 *6  > 14 Forearm-Wrist 10    Left Ulnar Sensory  Wrist-Dig V 2.9  < 3.1 *8  > 10 Wrist-Dig V 11    Right Ulnar Sensory  Wrist-Dig V 2.9  <  3.1 *6  > 10 Wrist-Dig V 11     Electromyography   Side Muscle Ins.Act Fibs Fasc Recrt Amp Dur Poly Activation Comment  Left FDI Nml Nml Nml *2- *2+ *2+ *2+ Nml N/A  Left EIP Nml Nml Nml Nml Nml Nml Nml Nml N/A  Left FPL Nml *1+ Nml *2- *1+ *1+ *2+ Nml N/A  Left Pronator teres Nml Nml Nml Nml Nml Nml Nml Nml N/A  Left Biceps Nml Nml Nml Nml Nml Nml Nml Nml N/A  Left APB Nml Nml Nml *SMU *2+ *2+ *2+ Nml N/A  Left Triceps Nml Nml Nml Nml Nml Nml Nml Nml N/A  Left Deltoid Nml Nml Nml Nml Nml Nml Nml Nml N/A  Left C7 PSP Nml Nml Nml Nml Nml Nml Nml Nml N/A  Right FDI Nml *2+ Nml *SMU *1- Nml Nml Nml N/A  Right EIP Nml *1+ Nml *SMU *1+ *1+ *1+ Nml N/A  Right FPL *MKD *3+ Nml *None *- *- *- Nml N/A  Right APB Nml *1+ Nml *None *- *- *- Nml N/A  Right Pronator teres Nml Nml Nml Nml Nml Nml Nml Nml N/A  Right Biceps *MKD Nml Nml *3- Nml *1+ *2+ Nml N/A  Right Triceps Nml *1+ Nml *3- *1+ *1+ *1+ Nml N/A  Right Deltoid Nml Nml Nml *None *- *- *- Nml N/A  Right Infraspin *MKD Nml Nml *3- *1+ *1+ *1+ Nml N/A  Right C7 PSP Nml Nml Nml Nml Nml Nml Nml Nml N/A      Waveforms:  Motor           Sensory                         F-Wave

## 2023-03-30 NOTE — Telephone Encounter (Signed)
Discussed the results of patient's EMG after the procedure today. It showed evidence of a diffuse, severe, right brachial plexopathy and more moderate, lower truck left brachial plexopathy.   Patient continues to have significant pain, especially at night. Given this, we agreed to update the plan as follows: -Continue Cymbalta 60 mg daily (has been taking about 1 week) -Increase gabapentin to 600 mg midday and 2400 mg in the evening  Will refer patient to Dr. Ernestine Mcmurray in NSGY to discuss further intervention.  All questions were answered.  Jacquelyne Balint, MD [facility]

## 2023-03-31 ENCOUNTER — Encounter: Payer: Self-pay | Admitting: Internal Medicine

## 2023-04-01 ENCOUNTER — Other Ambulatory Visit: Payer: Self-pay

## 2023-04-01 DIAGNOSIS — G540 Brachial plexus disorders: Secondary | ICD-10-CM

## 2023-04-01 DIAGNOSIS — M6259 Muscle wasting and atrophy, not elsewhere classified, multiple sites: Secondary | ICD-10-CM

## 2023-04-01 DIAGNOSIS — M79601 Pain in right arm: Secondary | ICD-10-CM

## 2023-04-02 ENCOUNTER — Ambulatory Visit (HOSPITAL_COMMUNITY)
Admission: RE | Admit: 2023-04-02 | Discharge: 2023-04-02 | Disposition: A | Discharge status: Home / Self Care | Payer: BC Managed Care – PPO | Source: Ambulatory Visit | Attending: Neurology | Admitting: Neurology

## 2023-04-02 ENCOUNTER — Encounter (HOSPITAL_COMMUNITY): Payer: Self-pay

## 2023-04-02 DIAGNOSIS — G540 Brachial plexus disorders: Secondary | ICD-10-CM

## 2023-04-02 DIAGNOSIS — M6259 Muscle wasting and atrophy, not elsewhere classified, multiple sites: Secondary | ICD-10-CM | POA: Diagnosis not present

## 2023-04-02 DIAGNOSIS — M79601 Pain in right arm: Secondary | ICD-10-CM | POA: Insufficient documentation

## 2023-04-02 DIAGNOSIS — M792 Neuralgia and neuritis, unspecified: Secondary | ICD-10-CM | POA: Diagnosis not present

## 2023-04-02 DIAGNOSIS — T451X5A Adverse effect of antineoplastic and immunosuppressive drugs, initial encounter: Secondary | ICD-10-CM | POA: Diagnosis not present

## 2023-04-02 DIAGNOSIS — M79602 Pain in left arm: Secondary | ICD-10-CM | POA: Diagnosis not present

## 2023-04-02 DIAGNOSIS — M7581 Other shoulder lesions, right shoulder: Secondary | ICD-10-CM | POA: Diagnosis not present

## 2023-04-02 MED ORDER — GADOBUTROL 1 MMOL/ML IV SOLN
8.0000 mL | Freq: Once | INTRAVENOUS | Status: AC | PRN
  Administered 2023-04-02: 8 mL via INTRAVENOUS

## 2023-04-02 NOTE — Progress Notes (Signed)
Referring Physician:  Antony Madura, MD [address] [place] [address] [place],  [place] [address]  Primary Physician:  [name], MD  History of Present Illness: 04/07/23  Mr. Damonie Yelinek is here today with a chief complaint of progressive right upper extremity pain and wasting.  He has a history of right shoulder pain as well as his neck.  He feels like this is continuing to progress.  He is getting numbness and tingling that radiates all the way down to his elbow and some into his forearm.  He feels that his arm is worsening and continues to have worsening worsening function.  He feels like his symptoms may be started in November of last year but started to become more severe in February.  His pain is so significant it is interrupting his sleep.  He has fasciculations as well as muscle cramps in the right arm.  He does have some left-sided symptoms which sound mostly ulnar.  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  Review of Systems:  A 10 point review of systems is negative, except for the pertinent positives and negatives detailed in the HPI.  Past Medical History: Past Medical History:  Diagnosis Date   COPD (chronic obstructive pulmonary disease) (HCC)    Diabetes mellitus without complication (HCC)    Hypertension    nscl ca dx'd 08/2018   Pneumonia    Subarachnoid hemorrhage [facility])     Past Surgical History: Past Surgical History:  Procedure Laterality Date   INGUINAL HERNIA REPAIR Right 06/24/2020   Procedure: RIGHT OPEN INGUINAL HERNIA REPAIR WITH MESH;  Surgeon: Kinsinger, De Blanch, MD;  Location: [facility];  Service: General;  Laterality: Right;   IR IVC FILTER PLMT / S&I Lenise Arena GUID/MOD SED  03/04/2020   IR IVC FILTER RETRIEVAL / S&I Lenise Arena GUID/MOD SED  08/15/2020   IR RADIOLOGIST EVAL & MGMT  08/06/2020   LEG SURGERY  age 34    left leg, from fracture    Allergies: Allergies as of 04/07/2023    (No Known Allergies)    Medications:  Current Outpatient Medications:    albuterol (VENTOLIN HFA) 108 (90 Base) MCG/ACT inhaler, INHALE 2 PUFFS INTO THE LUNGS EVERY 4 (FOUR) HOURS AS NEEDED FOR WHEEZING OR SHORTNESS OF BREATH., Disp: 6.7 each, Rfl: 4   [name] 150 MG capsule, TAKE 4 CAPSULES BY MOUTH TWICE DAILY WITH FOOD. SWALLOW CAPSULES WHOLE. DO NOT OPEN OR CRUSH. STORE IN ORIGINAL CONTAINER, Disp: 240 capsule, Rfl: 2   amLODipine (NORVASC) 2.5 MG tablet, Take 2.5 mg by mouth daily., Disp: , Rfl:    atorvastatin (LIPITOR) 20 MG tablet, Take 20 mg by mouth daily., Disp: , Rfl:    B Complex Vitamins (B COMPLEX PO), Take 1 tablet by mouth daily. (Patient not taking: Reported on 03/30/2023), Disp: , Rfl:    baclofen (LIORESAL) 10 MG tablet, Take 10 mg by mouth 3 (three) times daily. (Patient not taking: Reported on 03/30/2023), Disp: , Rfl:    DULoxetine (CYMBALTA) 60 MG capsule, Take 1 capsule (60 mg total) by mouth daily., Disp: 90 capsule, Rfl: 3   gabapentin (NEURONTIN) 600 MG tablet, Take 1 tablet (600 mg) midday and take 4 tablets (2400 mg) at bedtime, Disp: 120 tablet, Rfl: 5   levothyroxine (SYNTHROID) 100 MCG tablet, TAKE 1 TABLET BY MOUTH DAILY BEFORE BREAKFAST., Disp: 90 tablet, Rfl: 1   Multiple Vitamin (MULTIVITAMIN) tablet, Take 1  tablet by mouth daily. (Patient not taking: Reported on 03/17/2023), Disp: , Rfl:    TRELEGY ELLIPTA 100-62.5-25 MCG/INH AEPB, Inhale 1 puff into the lungs daily., Disp: , Rfl:   Social History: Social History   Tobacco Use   Smoking status: Never   Smokeless tobacco: Never  Vaping Use   Vaping status: Never Used  Substance Use Topics   Alcohol use: Yes    Alcohol/week: 5.0 standard drinks of alcohol    Types: 5 Standard drinks or equivalent per week    Comment: occasionally   Drug use: No    Family Medical History: Family History  Problem Relation Age of Onset   Diabetes Mother    Diabetes Brother    Colon cancer Neg Hx    Stomach  cancer Neg Hx    Thyroid cancer Neg Hx     Physical Examination: There were no vitals filed for this visit.  General: Patient is in no apparent distress. Attention to examination is appropriate.  Neck:   Supple.  Full range of motion.  Positive Tinel sign at the supraclavicular space  Respiratory: Patient is breathing without any difficulty.   NEUROLOGICAL:     Awake, alert, oriented to person, place, and time.  Speech is clear and fluent.   Cranial Nerves: Pupils equal round and reactive to light.  Facial tone is symmetric. Shoulder shrug is symmetric. Tongue protrusion is midline.   Motor Exam:  Motor exam focused specifically on the right upper extremity demonstrates very mild proximal weakness in the shoulder, he does have very significant distal ulnar and median weakness noted, he has 2-3 out of 5 in most distal ulnar and median innervated musculature.  Shows significant muscle wasting.  He does have weakness in his tricep as well.  Left upper extremity demonstrates some intrinsic ulnar weakness  Reflexes are decreased at the triceps, present at the biceps but diminished.  Decreased in middle and lower trunk distribution on the right, ulnar distribution on the left  Gait is normal.     Medical Decision Making  Imaging: Reviewed his previous MRI of his brachial plexus, no evidence of significant mass effect, on my personal review of his MRI which is not yet officially read was that he demonstrates a significant amount of T2 hyperintensity mostly in the lower and middle trunk.  No evidence of mass lesion.  Will follow-up on his final read for any signs of diffuse or perineural deposits.  Electrodiagnostics:  [facility]  [address], [address]  [place], [place] [address] Tel: [phone] Fax: [phone] Test Date:  03/30/2023   Patient: Deundra Mehle DOB: 05-Mar-1961 Physician: Jacquelyne Balint  Sex: Male Height: 5\' 8"  Ref Phys:    ID#: [id number]      Technician:      History: This is a 62 year old male with bilateral upper limb pain and weakness.   NCV & EMG Findings: Extensive electrodiagnostic evaluation of bilateral upper limbs shows: Bilateral median antebrachial cutaneous (MAC) and right lateral antebrachial cutaneous (LAC) sensory responses are absent. Bilateral median, bilateral ulnar, and bilateral radial sensory responses show reduced amplitudes (see table below). Left LAC is present. Bilateral median (APB) motor responses show reduced amplitudes (right 1.17, left 4.0 mV). Right ulnar (ADM) motor response shows reduced amplitude (4.0 mV). Left ulnar (ADM) motor response is within normal limits, but borderline normal (7.3 mV). Chronic motor axon loss changes WITH accompanying active denervation changes are seen in the bilateral flexor pollicis longus, right first  dorsal interosseous, right extensor inidicis proprius, abductor pollicis brevis, and triceps muscles. Chronic motor axon loss changes WITHOUT active denervation changes are seen in right biceps, right deltoid, right infraspinatus, left first dorsal interosseous, left extensor indicis proprius, and left abductor pollicis brevis muscles.  Myokymic discharges are seen in the right flexor pollicis longus, right biceps, and right infraspinatus muscles.   Impression: This is an abnormal study. 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 There are myokymic discharges in 3 muscles of the right upper limb, as may be seen in radiation plexopathy. 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 An overlapping left C8-T1 radiculopathy cannot be completely excluded, though normal cervical paraspinal evaluation makes #2 more likely.       ___________________________ Jacquelyne Balint  I have personally reviewed  the images and electrodiagnostics and agree with the above interpretation.  Assessment and Plan: Mr. Southard is a pleasant 62 y.o. male with significant right sided upper extremity progressive weakness numbness and pain.  The pain is so severe it is keeping him from sleep.  He has tried neuropathic pain medications but has not got good control yet.  He has been followed by her neurology team who is worked him up with electrodiagnostics.  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  His symptoms are mostly in the lower trunk or ulnar on the left, he states that he is now using his left hand significantly and wonders whether or not this is positional or exertional in component.  His EMG did not demonstrate any paraspinal involvement on the left so likely a peripheral location.  In regards to his brachial plexopathy, he does not have clear high-quality donors for motor reinnervation of his right hand however we did discuss the possibility of addressing his pain and discomfort with the setting of the radiation plexopathy.  In some cases patients respond well to brachial plexus exploration, decompression, and nerve stimulation.  We like to evaluate him with a scalene block to see whether or not this helps with his symptomatology.  Otherwise we will discuss possibilities for more centrally acting treatments.  Will make a referral to our pain team for scalene block, evaluation for peripheral nerve stimulator  Thank you for involving me in the care of this patient.    Lovenia Kim MD/MSCR Neurosurgery - Peripheral Nerve Surgery

## 2023-04-07 ENCOUNTER — Encounter: Payer: Self-pay | Admitting: Neurology

## 2023-04-07 ENCOUNTER — Encounter: Payer: Self-pay | Admitting: Neurosurgery

## 2023-04-07 ENCOUNTER — Ambulatory Visit (INDEPENDENT_AMBULATORY_CARE_PROVIDER_SITE_OTHER): Payer: BC Managed Care – PPO | Admitting: Neurosurgery

## 2023-04-07 VITALS — BP 110/72 | Ht 68.0 in | Wt 183.6 lb

## 2023-04-07 DIAGNOSIS — C3491 Malignant neoplasm of unspecified part of right bronchus or lung: Secondary | ICD-10-CM | POA: Diagnosis not present

## 2023-04-07 DIAGNOSIS — G540 Brachial plexus disorders: Secondary | ICD-10-CM

## 2023-04-12 ENCOUNTER — Ambulatory Visit: Payer: BC Managed Care – PPO | Attending: Neurology | Admitting: Occupational Therapy

## 2023-04-12 ENCOUNTER — Encounter: Payer: Self-pay | Admitting: Occupational Therapy

## 2023-04-12 ENCOUNTER — Telehealth: Payer: Self-pay | Admitting: Neurosurgery

## 2023-04-12 ENCOUNTER — Encounter: Payer: BC Managed Care – PPO | Admitting: Neurology

## 2023-04-12 DIAGNOSIS — M79601 Pain in right arm: Secondary | ICD-10-CM | POA: Diagnosis not present

## 2023-04-12 DIAGNOSIS — M25641 Stiffness of right hand, not elsewhere classified: Secondary | ICD-10-CM | POA: Diagnosis not present

## 2023-04-12 DIAGNOSIS — M79602 Pain in left arm: Secondary | ICD-10-CM | POA: Diagnosis not present

## 2023-04-12 DIAGNOSIS — R278 Other lack of coordination: Secondary | ICD-10-CM

## 2023-04-12 NOTE — Therapy (Signed)
OUTPATIENT OCCUPATIONAL THERAPY NEURO EVALUATION  Patient Name: Brian [name] MRN: [id number] DOB:[birth year], 62 y.o., male Today's Date: 04/12/2023  PCP: Keturah Barre REFERRING PROVIDER: Jacquelyne Balint  END OF SESSION:  OT End of Session - 04/12/23 1822     Visit Number 2    Number of Visits 25    Date for OT Re-Evaluation 06/28/23    Authorization Type BC[employer]  $1600 deductible has been met  20% coinsurance applies  $8500 oop, zero applied  VL combined with pt, ot, st: 60    OT Start Time 1545    OT Stop Time 1635    OT Time Calculation (min) 50 min    Equipment Utilized During Treatment floatation equipment    Activity Tolerance Patient tolerated treatment well    Behavior During Therapy WFL for tasks assessed/performed             Past Medical History:  Diagnosis Date   COPD (chronic obstructive pulmonary disease) (HCC)    Diabetes mellitus without complication (HCC)    Hypertension    nscl ca dx'd 08/2018   Pneumonia    Subarachnoid hemorrhage (HCC)    Past Surgical History:  Procedure Laterality Date   INGUINAL HERNIA REPAIR Right 06/24/2020   Procedure: RIGHT OPEN INGUINAL HERNIA REPAIR WITH MESH;  Surgeon: Kinsinger, De Blanch, MD;  Location: [facility];  Service: General;  Laterality: Right;   IR IVC FILTER PLMT / S&I Lenise Arena GUID/MOD SED  03/04/2020   IR IVC FILTER RETRIEVAL / S&I Lenise Arena GUID/MOD SED  08/15/2020   IR RADIOLOGIST EVAL & MGMT  08/06/2020   LEG SURGERY  age 89    left leg, from fracture   Patient Active Problem List   Diagnosis Date Noted   Recurrent right pleural effusion 07/23/2021   Hypothyroidism 10/24/2020   Acute hypoxemic respiratory failure (HCC) 05/19/2020   Acute respiratory failure with hypoxia (HCC) 05/18/2020   Essential hypertension 05/18/2020   Type 2 diabetes mellitus with hyperlipidemia (HCC) 05/18/2020   S/P thoracentesis 03/21/2020   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/05/2020   Calf swelling 02/21/2020   Rash  02/07/2020   Adenocarcinoma of right lung, stage 4 (HCC) 02/07/2020   Chronic cough 12/20/2019   Venous thrombosis 09/21/2019   Shortness of breath 09/07/2019   Headache 08/09/2019   Lobar pneumonia, unspecified organism (HCC) 03/07/2019   Centrilobular emphysema (HCC) 03/07/2019   Encounter for antineoplastic immunotherapy 11/10/2018   Adenocarcinoma of right lung, stage 3 (HCC) 08/25/2018   Goals of care, counseling/discussion 08/25/2018   Encounter for antineoplastic chemotherapy 08/25/2018   Mass of upper lobe of right lung 08/04/2018   Subarachnoid hemorrhage (HCC) 05/22/2015    ONSET DATE: 03/17/23 Referral  REFERRING DIAG: UE Pain and paresthesias  THERAPY DIAG:  Pain in right arm  Other lack of coordination  Stiffness of right hand, not elsewhere classified  Pain in left arm  Rationale for Evaluation and Treatment: Rehabilitation  SUBJECTIVE:   SUBJECTIVE STATEMENT: Wants to feel better and regain use of RUE Pt accompanied by: self  PERTINENT HISTORY: Lung cancer, SAH 2016  PRECAUTIONS: None  WEIGHT BEARING RESTRICTIONS: No  PAIN:  Are you having pain? Yes: NPRS scale: 6/10 Pain location: right arm Pain description: sharp, stabbing, throbbing, aching Aggravating factors: idle - positional Relieving factors: heat, ice, gabapentin  FALLS: Has patient fallen in last 6 months? Yes. Number of falls 1- fell last night  LIVING ENVIRONMENT: Lives with: lives with their family and lives with their spouse  Lives in: House/apartment Stairs: Yes: External: 2 steps; none Has following equipment at home:  has built in shower seat  PLOF: Independent with basic ADLs  PATIENT GOALS: to be able to get back to some level of use of my right hand  OBJECTIVE:   HAND DOMINANCE: Right  ADLs: Overall ADLs: min assist and increased time Transfers/ambulation related to ADLs: Eating: min assist - wife assists with cutting.  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  Still driving - uses LUE only at this point Handwriting: unable   POSTURE COMMENTS:  forward head and posterior pelvic tilt   ACTIVITY TOLERANCE: Activity tolerance: reports more fatigue due to constant pain   UPPER EXTREMITY ROM:    Active ROM Right eval Left eval  Shoulder flexion 115 unable to maintain ext elbow 140  Shoulder abduction    Shoulder adduction    Shoulder extension    Shoulder internal rotation    Shoulder external rotation    Elbow flexion WFL   Elbow extension WFL   Wrist flexion    Wrist extension 30   Wrist ulnar deviation 10   Wrist radial deviation 5   Wrist pronation    Wrist supination    (Blank rows = not tested)  UPPER EXTREMITY MMT:     MMT Right eval Left eval  Shoulder flexion NT 4-/5  Shoulder abduction    Shoulder adduction    Shoulder extension    Shoulder internal rotation    Shoulder external rotation    Middle trapezius    Lower trapezius    Elbow flexion    Elbow extension    Wrist flexion 3   Wrist extension 3   Wrist ulnar deviation    Wrist radial deviation    Wrist pronation    Wrist supination    (Blank rows = not tested) RIGHT HAND - 15% COMPOSITE FLEXION     - 40% COMPOSITE EXTENSION HAND FUNCTION: Grip strength: Right: UNABLE lbs; Left: 31.9 lbs  COORDINATION: Finger Nose Finger test: Mild undershooting Left - right lacks strength/ proximal control to complete  SENSATION: WFL to testing, but reports heightened sensation - hypersensitive  EDEMA: Atrophy RUE - UPPER ARM, FOREARM  MUSCLE TONE: RUE: Moderate and Hypotonic and LUE: Within functional  limits  COGNITION: Overall cognitive status: Within functional limits for tasks assessed.  Reports some concentration deficits which he attributes to pain  VISION: Subjective report: no changes Baseline vision: Wears glasses all the time Visual history:  nothing significant  VISION ASSESSMENT: Not tested  Patient has difficulty with following activities due to following visual impairments: NA  PERCEPTION: WFL  PRAXIS: WFL   TODAY'S TREATMENT:  DATE:  04/12/23 Patient seen for initial aquatic therapy visit.  Patient reports being comfortable in the water, having used water as exercise and recreation in the past.  Reviewed Engineering geologist of pool.  Patient is a member and will have access to pool to continue water exercise if beneficial.  Reviewed policy relating to inclement weather - patient demo'd understanding.   Reviewed recent MD visits with patient.  Hs had recent EMG and neuro follow up in which significant increase in medication.  He feels this is helping although has difficulty finding comfortable sleep positions.  Has had f/u with neurosurgery and per patient is not a surgical candidate at this time.    Treatment occurred in 3.6-4.8 ft of warm water.  Patient entered and exited the pool via stairs and railing on left.  Patient with limited grasp in right hand, hand appears mildly edematous this session- moreso in digits than dorsum of hand.  Worked in modified plantigrade to provide loaded stretch to long left arm.  Patient reports stretching sensation and neuropathic pain in elbow, and forearm/hand.   Worked while back stabilized on wall to allow arms to "float to surface" to promote movement with less effort and more correct biomechanical reach pattern by use of buoyancy.  Patient able to walk in water using arm swing for added resistance. Encouraged  movement of hands and wrists thru water to promote increased range of motion.  Patient with limited R wrist flexion with forearm pronated.  Improved active wrist flexion noted with forearm in neutral.  Consider carpal malalignment.  Provided gentle mobilization to carpal/metacarpal, and carpal/ forearm without adverse response.  Patient feels pool is beneficial and would like to continue.  Noted improved active assisted right elbow extension this session.   8/27 Discussed potential sleeping positions, and support to reduce pain at night.     PATIENT EDUCATION: Education details: sleeping positions, benefits of aquatic therapy Person educated: Patient Education method: Explanation Education comprehension: verbalized understanding  HOME EXERCISE PROGRAM: TBD   GOALS: Goals reviewed with patient? Yes  SHORT TERM GOALS: Target date: 04/30/23  Patient will complete HEP designed to improve Bilateral shoulder range of motion  Goal status: INITIAL  2.  Patient will complete HEP designed to improve R elbow flex/ext strength  Goal status: INITIAL  3.  Patient will complete HEP designed to improve range of motion for composite flexion and extension in right hand/wrist  Goal status: INITIAL  4.  Patient will demonstrate ability to pick up and retrieve lightweight object from table with RUE - e.g. cell phone  Goal status: INITIAL  5.  Patient will report improved awareness of positional changes/ support techniques to reduce UE pain.    Goal status: INITIAL  6.  Patient will demonstrate 50% composite active flex/ext in right hand  Goal status: INITIAL  LONG TERM GOALS: Target date: 06/28/23  Patient will complete updated HEP designed on UE strengthening without increase in pain  Goal status: INITIAL  2.  Patient will demonstrate 10 lb grip strength R hand  Goal status: INITIAL  3.  Patient will demonstrate low reach RUE to obtain and release a 2 lb object x 5 without  dropping  Goal status: INITIAL  4.  Patient will be able to cut his own food with modified independence  Goal status: INITIAL  5.  Patient will be independent with aquatic exercise program addressing BUE active movement and strength  Goal status: INITIAL  6.  Patient will dress himself with  modified independence  Goal status: INITIAL  ASSESSMENT:  CLINICAL IMPRESSION: 62 y.o. right-handed male with a medical history of diabetes, hypothyroidism, lung cancer s/p chemo and radiation, emphysema, SAH, and HLD who presents to neurology clinic with the chief complaint of upper extremity pain and paresthesias,  seen today for occupational therapy evaluation.  PERFORMANCE DEFICITS: in functional skills including dexterity, proprioception, sensation, tone, ROM, strength, pain, muscle spasms, Fine motor control, Gross motor control, endurance, and UE functional use  IMPAIRMENTS: are limiting patient from ADLs, IADLs, rest and sleep, work, and leisure.   CO-MORBIDITIES: may have co-morbidities  that affects occupational performance. Patient will benefit from skilled OT to address above impairments and improve overall function.  MODIFICATION OR ASSISTANCE TO COMPLETE EVALUATION: Min-Moderate modification of tasks or assist with assess necessary to complete an evaluation.  OT OCCUPATIONAL PROFILE AND HISTORY: Detailed assessment: Review of records and additional review of physical, cognitive, psychosocial history related to current functional performance.  CLINICAL DECISION MAKING: Moderate - several treatment options, min-mod task modification necessary  REHAB POTENTIAL: Good  EVALUATION COMPLEXITY: Moderate    PLAN:  OT FREQUENCY: 2x/week  OT DURATION: 12 weeks  PLANNED INTERVENTIONS: self care/ADL training, therapeutic exercise, therapeutic activity, neuromuscular re-education, manual therapy, passive range of motion, functional mobility training, aquatic therapy, splinting,  electrical stimulation, ultrasound, paraffin, fluidotherapy, moist heat, cryotherapy, contrast bath, patient/family education, and DME and/or AE instructions  RECOMMENDED OTHER SERVICES: may have potential for counseling - discussed with patient - he feels he is ok now, but will keep it in mind  CONSULTED AND AGREED WITH PLAN OF CARE: Patient  PLAN FOR NEXT SESSION: Aquatic therapy to address gentle motion / gentle resistance with less influence of gravity.  Clinic - discuss possible sleeping positions/ support, start HEP for proximal and distal movement RUE has greater need than LUE   [name] Salina, OT 04/12/2023, 6:23 PM

## 2023-04-12 NOTE — Progress Notes (Unsigned)
I saw DEMAREO DAVISON in neurology clinic on 04/21/23 in follow up for upper extremity pain, paresthesias, and weakness.  HPI: ARTHURO NOETZEL is a 62 y.o. year old right-handed male with a medical history of diabetes, hypothyroidism, lung cancer s/p chemo and radiation, emphysema, SAH, and HLD who we last saw on 03/17/23.  To briefly review: Patient has pain starting at the base of the neck that radiates mostly into the right arm. He has no use in his 4th and 5th digits. He has a lot of pain in the neck, shoulder, into the elbow, and into the arm. The pain is very severe and worse at night. In the left arm patient has some numbness and pain, most in the forearm. He feels like it is a bad sunburn. He has muscle atrophy and is getting progressively worse. Patient thinks symptoms started in 06/2022 and were mild. Symptoms became severe in 09/2022. The pain is so bad currently that he sleeps only a couple hours per night.   He denies similar symptoms in the past.   He endorses muscle cramps in right triceps mainly. He endorses some fasciculations in his arms. He denies significant low back pain or symptoms in his legs.   The patient denies symptoms suggestive of oculobulbar weakness including diplopia, ptosis, dysphagia, poor saliva control, dysarthria/dysphonia, impaired mastication, facial weakness/droop.   There are no neuromuscular respiratory weakness symptoms, particularly orthopnea>dyspnea.    Pseudobulbar affect is absent.   He does not report any constitutional symptoms like fever, night sweats, anorexia or unintentional weight loss. He does report 10 lbs of weight loss but this is intentional.   EtOH use: 2-3 beers a month  Restrictive diet? No Family history of neuropathy/myopathy/neurologic disease? No   Patient has seen spine and maybe saw neurology in [facility]. He has also been seen at the hand center. He saw PM&R at [facility] and had EMG on 02/12/23 and 02/17/23 that showed right  brachial plexopathy (per notes: upper trunk brachial pan plexopathy in the right upper extremity predominantly in the lower trunk distribution). Patient states that he was told he had carpal tunnel and ulnar neuropathy, which is what the LUE report mentioned.   Patient has been prescribed gabapentin 1200 mg at night (sometimes will take 1800 mg). He is not sure it is working. He denies any side effects. He also has baclofen 10 mg TID, but does not really take this. His pain is so bad though, he does not have good relief. He also uses ice and heat. He also had a steroid shot and Toradol that only helped about 2 hours.   Oncology history per Dr. Asa Lente oncology clinic note (12/07/22):   Metastatic non-small cell lung cancer initially diagnosed as stage IIIB (T1c, N3, M0) non-small cell lung cancer, adenocarcinoma presented with right upper lobe lung mass in addition to mediastinal and bilateral supraclavicular lymphadenopathy diagnosed in January 2020.  He had evidence of disease recurrence in June 2021 with adenopathy in the subcarinal, level 3 left neck lymph node, and lymph node at the curious of the diaphragm and in the upper abdomen.    PRIOR THERAPY: 1) Concurrent chemoradiation with weekly carboplatin for AUC of 2 and paclitaxel 45 mg/M2.  Status post 6 cycles.  Last dose was given on October 10, 2018 with stable disease. 2) Radiation treatment to the enlarging right cervical lymph nodes under the care of Dr. Roselind Messier. First treatment 03/06/2019. Last treatment scheduled on 04/13/2019 3) Consolidation treatment with immunotherapy  with Imfinzi 10 mg/KG every 2 weeks.  First dose November 17, 2018.  Status post 26 cycles. 4) Systemic chemotherapy with carboplatin for an AUC of 5, Alimta 500 mg/m2, and Keytruda 200 mg IV every 3 weeks. First dose expected on 02/14/20. Status post 1 cycle.  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 5) SBRT to the left lower lobe lung nodule.   CURRENT THERAPY:  [name] ([name]) 600 mg p.o. twice daily.  He started the first dose on March 08, 2020.   Status post 33 months of treatment.   Per patient, he is currently in remission.  Most recent Assessment and Plan (03/17/23): MCGARRETT SONNER is a 62 y.o. male who presents for evaluation of neck pain, shoulder pain, and bilateral arm pain and weakness (right more than left). He has a relevant medical history of diabetes, hypothyroidism, lung cancer s/p chemo and radiation, emphysema, SAH, and HLD. His neurological examination is pertinent for atrophy of bilateral forearms and intrinsic hand muscles with weakness in bilateral arms, right greater than left. Interestingly, he has hyperreflexia in LUE, though this could be chronic due to old infarct seen on MRI. His previous EMG of the RUE suggested an active brachial plexopathy and EMG of LUE was more confusing but active C8 radiculopathy, CTS, and ulnar at the elbow. While this is possible, I wonder if the LUE symptoms are also plexus related. He has a history of lung cancer and radiation, so a radiation plexopathy is possible though with this significant amount of pain, neuralgic amyotrophy unrelated to radiation is also possible.    PLAN: -Blood work: B6 -Repeat EMG - bilateral arms (brachial plexus protocol) -OT for arm weakness -Pain control:             -Wean off venlafaxine: take 1 tablet every other day for 1 week then stop             -Start Cymbalta 60 mg daily after stopping venlafaxine             -Change gabapentin 600 mg midday and 1800 mg at night             -Lidocaine cream PRN  Since their last visit: EMG on 03/30/23 did show evidence of bilateral brachial plexopathy with myokymia as would be seen in radiation plexopathy. Patient continued to have significant pain, especially at night. I increased his gabapentin to 600 mg midday and 2400 mg in the evening while  continuing Cymbalta 60 mg daily (03/30/23).***  MRI of brachial plexus on 04/02/23***.  Patient saw Dr. Katrinka Blazing in NSGY on 04/07/23***  ROS: Pertinent positive and negative systems reviewed in HPI. ***   MEDICATIONS:  Outpatient Encounter Medications as of 04/21/2023  Medication Sig   albuterol (VENTOLIN HFA) 108 (90 Base) MCG/ACT inhaler INHALE 2 PUFFS INTO THE LUNGS EVERY 4 (FOUR) HOURS AS NEEDED FOR WHEEZING OR SHORTNESS OF BREATH.   [name] 150 MG capsule TAKE 4 CAPSULES BY MOUTH TWICE DAILY WITH FOOD. SWALLOW CAPSULES WHOLE. DO NOT OPEN OR CRUSH. STORE IN ORIGINAL CONTAINER   amLODipine (NORVASC) 2.5 MG tablet Take 2.5 mg by mouth daily.   atorvastatin (LIPITOR) 20 MG tablet Take 20 mg by mouth daily.   baclofen (LIORESAL) 10 MG tablet Take 10 mg by mouth 3 (three) times daily.   DULoxetine (CYMBALTA) 60 MG capsule Take 1 capsule (60 mg total) by mouth daily.   gabapentin (  NEURONTIN) 600 MG tablet Take 1 tablet (600 mg) midday and take 4 tablets (2400 mg) at bedtime   TRELEGY ELLIPTA 100-62.5-25 MCG/INH AEPB Inhale 1 puff into the lungs daily.   No facility-administered encounter medications on file as of 04/21/2023.    PAST MEDICAL HISTORY: Past Medical History:  Diagnosis Date   COPD (chronic obstructive pulmonary disease) (HCC)    Diabetes mellitus without complication (HCC)    Hypertension    nscl ca dx'd 08/2018   Pneumonia    Subarachnoid hemorrhage (HCC)     PAST SURGICAL HISTORY: Past Surgical History:  Procedure Laterality Date   INGUINAL HERNIA REPAIR Right 06/24/2020   Procedure: RIGHT OPEN INGUINAL HERNIA REPAIR WITH MESH;  Surgeon: Kinsinger, De Blanch, MD;  Location: [facility];  Service: General;  Laterality: Right;   IR IVC FILTER PLMT / S&I Lenise Arena GUID/MOD SED  03/04/2020   IR IVC FILTER RETRIEVAL / S&I Lenise Arena GUID/MOD SED  08/15/2020   IR RADIOLOGIST EVAL & MGMT  08/06/2020   LEG SURGERY  age 63    left leg, from fracture    ALLERGIES: No Known Allergies  FAMILY  HISTORY: Family History  Problem Relation Age of Onset   Diabetes Mother    Diabetes Brother    Colon cancer Neg Hx    Stomach cancer Neg Hx    Thyroid cancer Neg Hx     SOCIAL HISTORY: Social History   Tobacco Use   Smoking status: Never   Smokeless tobacco: Never  Vaping Use   Vaping status: Never Used  Substance Use Topics   Alcohol use: Yes    Alcohol/week: 5.0 standard drinks of alcohol    Types: 5 Standard drinks or equivalent per week    Comment: occasionally   Drug use: No   Social History   Social History Narrative   Are you right handed or left handed? Right   Are you currently employed ?    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   Who lives with you? wife   What type of home do you live in: 1 story or 2 story? Two    Caffeine  3 cups a day     Objective:  Vital Signs:  There were no vitals taken for this visit.  General:*** General appearance: Awake and alert. No distress. Cooperative with exam.  Skin: No obvious rash or jaundice. HEENT: Atraumatic. Anicteric. Lungs: Non-labored breathing on room air  Heart: Regular Abdomen: Soft, non tender. Extremities: No edema. No obvious deformity.  Musculoskeletal: No obvious joint swelling.  Neurological: Mental Status: Alert. Speech fluent. No pseudobulbar affect Cranial Nerves: CNII: No RAPD. Visual fields intact. CNIII, IV, VI: PERRL. No nystagmus. EOMI. CN V: Facial sensation intact bilaterally to fine touch. [place] clench strong. Jaw jerk***. CN VII: Facial muscles symmetric and strong. No ptosis at rest or after sustained upgaze***. CN VIII: Hears finger rub well bilaterally. CN IX: No hypophonia. CN X: Palate elevates symmetrically. CN XI: Full strength shoulder shrug bilaterally. CN XII: Tongue protrusion full and midline. No atrophy or fasciculations. No significant dysarthria*** Motor: Tone is ***. *** fasciculations in *** extremities. *** atrophy. No  grip or percussive myotonia.  Individual muscle group testing (MRC grade out of 5):  Movement     Neck flexion ***    Neck extension ***     Right Left   Shoulder abduction *** ***   Shoulder adduction *** ***   Shoulder ext  rotation *** ***   Shoulder int rotation *** ***   Elbow flexion *** ***   Elbow extension *** ***   Wrist extension *** ***   Wrist flexion *** ***   Finger abduction - FDI *** ***   Finger abduction - ADM *** ***   Finger extension *** ***   Finger distal flexion - 2/3 *** ***   Finger distal flexion - 4/5 *** ***   Thumb flexion - FPL *** ***   Thumb abduction - APB *** ***    Hip flexion *** ***   Hip extension *** ***   Hip adduction *** ***   Hip abduction *** ***   Knee extension *** ***   Knee flexion *** ***   Dorsiflexion *** ***   Plantarflexion *** ***   Inversion *** ***   Eversion *** ***   Great toe extension *** ***   Great toe flexion *** ***     Reflexes:  Right Left  Bicep *** ***  Tricep *** ***  BrRad *** ***  Knee *** ***  Ankle *** ***   Pathological Reflexes: Babinski: *** response bilaterally*** Hoffman: *** Troemner: *** Pectoral: *** Palmomental: *** Facial: *** Midline tap: *** Sensation: Pinprick: *** Vibration: *** Temperature: *** Proprioception: *** Coordination: Intact finger-to- nose-finger and heel-to-shin bilaterally. Romberg negative.*** Gait: Able to rise from chair with arms crossed unassisted. Normal, narrow-based gait. Able to tandem walk. Able to walk on toes and heels.***   Lab and Test Review: New results: B6 (03/17/23) wnl  EMG (03/30/23): NCV & EMG Findings: Extensive electrodiagnostic evaluation of bilateral upper limbs shows: Bilateral median antebrachial cutaneous (MAC) and right lateral antebrachial cutaneous (LAC) sensory responses are absent. Bilateral median, bilateral ulnar, and bilateral radial sensory responses show reduced amplitudes (see table below). Left LAC is  present. Bilateral median (APB) motor responses show reduced amplitudes (right 1.17, left 4.0 mV). Right ulnar (ADM) motor response shows reduced amplitude (4.0 mV). Left ulnar (ADM) motor response is within normal limits, but borderline normal (7.3 mV). Chronic motor axon loss changes WITH accompanying active denervation changes are seen in the bilateral flexor pollicis longus, right first dorsal interosseous, right extensor inidicis proprius, abductor pollicis brevis, and triceps muscles. Chronic motor axon loss changes WITHOUT active denervation changes are seen in right biceps, right deltoid, right infraspinatus, left first dorsal interosseous, left extensor indicis proprius, and left abductor pollicis brevis muscles.  Myokymic discharges are seen in the right flexor pollicis longus, right biceps, and right infraspinatus muscles.   Impression: This is an abnormal study. 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 There are myokymic discharges in 3 muscles of the right upper limb, as may be seen in radiation plexopathy. 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 An overlapping left C8-T1 radiculopathy cannot be completely excluded, though normal cervical paraspinal evaluation makes #2 more likely.  MRI brachial plexus w/wo contrast (04/02/23): ***Read pending***  Previously reviewed results: TSH (11/06/22): elevated to 6.447   External labs: 01/27/23: CMP significant for mildly elevated glucose (112), Tbili (1.1), and Alk phos (124) TSH wnl HbA1c: 5.8 Lipid panel: total cholesterol 151, TG 75, LDL 62   B12 (10/20/21): 921   Imaging: External EMG (02/12/23 - cannot see table only this note): Jaydence Vanson is a 62 y.o. year old male with BUE pain and paresthesias (R >  L) referred for electrodiagnostics  to evaluate for peripheral nerve entrapment versus brachial plexopathy versus cervical radiculopathy. Patient does have a history of lung cancer s/p chemotherapy and radiation to the right upper lung field and right neck region. He also has a history of T2DM. Due to the severity of the findings, testing was limited to the right upper extremity today. He will come back next week for left upper extremity testing. 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 There is evidence of acute denervation in all right upper extremity muscles tested. This is consistent with the patient's clinical history of radiation. Due to the above plexopathy and diffuse findings in the right upper extremity on EMG, cannot rule out a more diffuse process such as motor neuron disease. Will bring patient back in to test for the left upper extremity next week. While the median nerve conduction studies were abnormal, this may be attributed to the more proximal plexopathy, but cannot definitively rule out a concomitant median neuropathy.    External EMG (02/17/23 - cannot see table only this note): Olden Wroe is a 62 y.o. year old male referred for electrodiagnostics, findings consistent with: Acute on chronic left C8 radiculopathy. Severe left-sided carpal tunnel syndrome. Left-sided cubital tunnel syndrome.    MRI brain w/wo contrast (10/15/22): FINDINGS: Brain:   No age advanced or lobar predominant parenchymal atrophy.   Multifocal T2 FLAIR hyperintense signal abnormality within the cerebral white matter, nonspecific but compatible with mild chronic small vessel ischemic disease.   Small chronic infarct within the right cerebellar hemisphere   There is no acute infarct.   No evidence of an intracranial mass.   No chronic intracranial blood products.   No  extra-axial fluid collection.   No midline shift.   No pathologic intracranial enhancement identified.   Vascular: Maintained flow voids within the proximal large arterial vessels.   Skull and upper cervical spine: No focal suspicious marrow lesion.   Sinuses/Orbits: No mass or acute finding within the imaged orbits. Partial T2 hyperintense opacification of the right frontal sinus and of an anterior right ethmoid air cell. Mild mucosal thickening scattered elsewhere within bilateral ethmoid air cells. Mild mucosal thickening within the bilateral maxillary sinuses.   IMPRESSION: 1. No evidence of intracranial metastatic disease. 2. Mild chronic small vessel ischemic changes within the cerebral white matter, similar to the prior brain MRI of 10/09/2021. 3. 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 4. Paranasal sinus disease, as described   MRI right brachial plexus (07/30/22): FINDINGS: Spinal cord   Visualized spinal cord demonstrates normal signal.   Brachial plexus:   Appears normal. No mass impinging on the brachial plexus is identified. No neurogenic tumor is identified.   Muscles and tendons   Normal signal throughout without evidence of denervation atrophy.   Bones   No fracture or worrisome lesion is seen. Small degenerative cyst in the right humeral head is noted.   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 The glenohumeral joint appears normal.   Other findings   Moderately large right pleural effusion appears increased in size compared to the prior chest CT.   IMPRESSION: Normal appearing right brachial plexus.   Moderately large right pleural effusion appears increased in size compared to the patient's 03/30/2022 chest CT.   Acromioclavicular osteoarthritis.   MRI cervical spine wo contrast (  07/30/22): FINDINGS: Alignment: Physiologic.   Vertebrae: No fracture,  evidence of discitis, or bone lesion.   Cord: Normal signal and morphology.   Posterior Fossa, vertebral arteries, paraspinal tissues: Negative.   Disc levels:   C2-C3: No significant disc bulge. Mild facet arthropathy. No spinal canal stenosis or neural foraminal narrowing.   C3-C4: No significant disc bulge. Left-greater-than-right facet arthropathy. No spinal canal stenosis or neural foraminal narrowing.   C4-C5: No significant disc bulge. Left-greater-than-right facet arthropathy. No spinal canal stenosis or neural foraminal narrowing.   C5-C6: Minimal disc bulge with right paracentral and subarticular disc protrusion, which indents the thecal sac but does not deform the spinal cord. Facet and uncovertebral hypertrophy. No spinal canal stenosis. Mild right neural foraminal narrowing.   C6-C7: No significant disc bulge. No spinal canal stenosis or neuroforaminal narrowing.   C7-T1: No significant disc bulge. No spinal canal stenosis or neuroforaminal narrowing.   IMPRESSION: 1. C5-C6 mild right neural foraminal narrowing. 2. Multilevel facet arthropathy, which can be a cause of pain.  ASSESSMENT: This is Delsa Sale, a 62 y.o. male with:  ***  Plan: ***  Return to clinic in ***  Total time spent reviewing records, interview, history/exam, documentation, and coordination of care on day of encounter:  *** min  Jacquelyne Balint, MD

## 2023-04-12 NOTE — Telephone Encounter (Signed)
Patient seen on 9/4. Dr. Katrinka Blazing was going to refer patient someone else for nerve stimulation. Patient does not remember the name of the provider Dr. Katrinka Blazing mentioned but he has not heard from anyone else. He is just checking up on that referral.

## 2023-04-13 ENCOUNTER — Other Ambulatory Visit: Payer: Self-pay | Admitting: Family Medicine

## 2023-04-13 DIAGNOSIS — G540 Brachial plexus disorders: Secondary | ICD-10-CM | POA: Insufficient documentation

## 2023-04-13 NOTE — Telephone Encounter (Signed)
Dr. Cherylann Ratel, will check in on referall

## 2023-04-13 NOTE — Telephone Encounter (Signed)
Patient notified Dr. Garnett Farm office will be in touch to schedule appointment.

## 2023-04-14 ENCOUNTER — Ambulatory Visit: Payer: BC Managed Care – PPO | Admitting: Occupational Therapy

## 2023-04-14 ENCOUNTER — Encounter: Payer: Self-pay | Admitting: Occupational Therapy

## 2023-04-14 DIAGNOSIS — M25641 Stiffness of right hand, not elsewhere classified: Secondary | ICD-10-CM

## 2023-04-14 DIAGNOSIS — R278 Other lack of coordination: Secondary | ICD-10-CM

## 2023-04-14 DIAGNOSIS — M79602 Pain in left arm: Secondary | ICD-10-CM

## 2023-04-14 DIAGNOSIS — M79601 Pain in right arm: Secondary | ICD-10-CM

## 2023-04-14 NOTE — Therapy (Signed)
OUTPATIENT OCCUPATIONAL THERAPY NEURO TREATMENT  Patient Name: Brian [name] MRN: [id number] DOB:1961-02-07, 62 y.o., male Today's Date: 04/14/2023  PCP: Keturah Barre REFERRING PROVIDER: Jacquelyne Balint  END OF SESSION:  OT End of Session - 04/14/23 0800     Visit Number 3    Number of Visits 25    Date for OT Re-Evaluation 06/28/23    Authorization Type [employer]  $1600 deductible has been met  20% coinsurance applies  $8500 oop, zero applied  VL combined with pt, ot, st: 60    OT Start Time 0800    OT Stop Time 0845    OT Time Calculation (min) 45 min    Equipment Utilized During Treatment floatation equipment    Activity Tolerance Patient tolerated treatment well    Behavior During Therapy WFL for tasks assessed/performed             Past Medical History:  Diagnosis Date   COPD (chronic obstructive pulmonary disease) (HCC)    Diabetes mellitus without complication (HCC)    Hypertension    nscl ca dx'd 08/2018   Pneumonia    Subarachnoid hemorrhage (HCC)    Past Surgical History:  Procedure Laterality Date   INGUINAL HERNIA REPAIR Right 06/24/2020   Procedure: RIGHT OPEN INGUINAL HERNIA REPAIR WITH MESH;  Surgeon: Kinsinger, De Blanch, MD;  Location: [facility];  Service: General;  Laterality: Right;   IR IVC FILTER PLMT / S&I Lenise Arena GUID/MOD SED  03/04/2020   IR IVC FILTER RETRIEVAL / S&I Lenise Arena GUID/MOD SED  08/15/2020   IR RADIOLOGIST EVAL & MGMT  08/06/2020   LEG SURGERY  age 65    left leg, from fracture   Patient Active Problem List   Diagnosis Date Noted   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   Recurrent right pleural effusion 07/23/2021   Hypothyroidism 10/24/2020   Acute hypoxemic respiratory failure (HCC) 05/19/2020   Acute respiratory failure with hypoxia (HCC) 05/18/2020   Essential hypertension 05/18/2020   Type 2 diabetes mellitus with hyperlipidemia (HCC) 05/18/2020   S/P thoracentesis 03/21/2020   Malignant neoplasm metastatic to brain (HCC)  03/05/2020   Calf swelling 02/21/2020   Rash 02/07/2020   Adenocarcinoma of right lung, stage 4 (HCC) 02/07/2020   Chronic cough 12/20/2019   Venous thrombosis 09/21/2019   Shortness of breath 09/07/2019   Headache 08/09/2019   Lobar pneumonia, unspecified organism (HCC) 03/07/2019   Centrilobular emphysema (HCC) 03/07/2019   Encounter for antineoplastic immunotherapy 11/10/2018   Adenocarcinoma of right lung, stage 3 (HCC) 08/25/2018   Goals of care, counseling/discussion 08/25/2018   Encounter for antineoplastic chemotherapy 08/25/2018   Mass of upper lobe of right lung 08/04/2018   Subarachnoid hemorrhage (HCC) 05/22/2015    ONSET DATE: 03/17/23 Referral  REFERRING DIAG: UE Pain and paresthesias  THERAPY DIAG:  Pain in right arm  Pain in left arm  Other lack of coordination  Stiffness of right hand, not elsewhere classified  Rationale for Evaluation and Treatment: Rehabilitation  SUBJECTIVE:   SUBJECTIVE STATEMENT:  Pain is worse at night. RUE worse, but mild pain LUE Pt accompanied by: self  PERTINENT HISTORY: Lung cancer w/ radiation, SAH 2016. Pt reports developing symptoms RUE this February and worsening symptoms in Rt hand more recently - MD thinks this is due to radiation approx 4 years ago  PRECAUTIONS: None  WEIGHT BEARING RESTRICTIONS: No  PAIN:  Are you having pain? Yes: NPRS scale: 5-8/10 Pain location: right arm Pain description: sharp, stabbing, throbbing, aching Aggravating factors: idle -  positional Relieving factors: heat, ice, gabapentin  FALLS: Has patient fallen in last 6 months? Yes. Number of falls 1- fell last night  LIVING ENVIRONMENT: Lives with: lives with their family and lives with their spouse Lives in: House/apartment Stairs: Yes: External: 2 steps; none Has following equipment at home:  has built in shower seat  PLOF: Independent with basic ADLs  PATIENT GOALS: to be able to get back to some level of use of my right  hand  OBJECTIVE:   HAND DOMINANCE: Right  ADLs: Overall ADLs: min assist and increased time Transfers/ambulation related to ADLs: Eating: min assist - wife assists with cutting.  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 driving - uses LUE only at this point Handwriting: unable   POSTURE COMMENTS:  forward head and posterior pelvic tilt   ACTIVITY TOLERANCE: Activity tolerance: reports more fatigue due to constant pain   UPPER EXTREMITY ROM:    Active ROM Right eval Left eval  Shoulder flexion 115 unable to maintain ext elbow 140  Shoulder abduction    Shoulder adduction    Shoulder extension    Shoulder internal rotation    Shoulder external rotation    Elbow flexion WFL   Elbow extension WFL   Wrist flexion    Wrist extension 30   Wrist ulnar deviation 10   Wrist radial deviation 5   Wrist pronation    Wrist supination    (Blank rows = not tested)  UPPER EXTREMITY MMT:     MMT Right eval Left eval  Shoulder flexion NT 4-/5  Shoulder abduction    Shoulder adduction    Shoulder extension    Shoulder internal rotation    Shoulder external rotation    Middle trapezius    Lower trapezius    Elbow flexion    Elbow extension    Wrist flexion 3   Wrist extension 3   Wrist ulnar deviation    Wrist radial deviation    Wrist pronation    Wrist supination    (Blank rows = not tested) RIGHT HAND - 15% COMPOSITE FLEXION     - 40% COMPOSITE EXTENSION HAND FUNCTION: Grip strength: Right: UNABLE lbs; Left: 31.9 lbs  COORDINATION: Finger Nose Finger test: Mild undershooting Left - right lacks strength/  proximal control to complete  SENSATION: WFL to testing, but reports heightened sensation - hypersensitive  EDEMA: Atrophy RUE - UPPER ARM, FOREARM  MUSCLE TONE: RUE: Moderate and Hypotonic and LUE: Within functional limits  COGNITION: Overall cognitive status: Within functional limits for tasks assessed.  Reports some concentration deficits which he attributes to pain  VISION: Subjective report: no changes Baseline vision: Wears glasses all the time Visual history:  nothing significant  VISION ASSESSMENT: Not tested  Patient has difficulty with following activities due to following visual impairments: NA  PERCEPTION: WFL  PRAXIS: WFL   TODAY'S TREATMENT:  DATE:   04/14/23: Therapist discussed possible bed positioning options with patient and provided handout.   Further assessed RUE w/ limited shoulder and elbow ROM and strength, and very minimal hand movement.  Provided appropriate HEP for neuro re-education for proximal RUE (sh and elbow) today and pt return demo of each with modifications prn - see pt instructions for details. Pt did require modifications to hold onto dowel Rt hand and shown how to make those modifications at home.   Pt also shown passive stretches to perform to Rt hand in composite flex, isolated MP flex, and isolated IP flex, and PIP extension to prevent stiffness/contractures     04/12/23 Patient seen for initial aquatic therapy visit.  Patient reports being comfortable in the water, having used water as exercise and recreation in the past.  Reviewed Engineering geologist of pool.  Patient is a member and will have access to pool to continue water exercise if beneficial.  Reviewed policy relating to inclement weather - patient demo'd understanding.   Reviewed recent MD visits with patient.  Hs had recent EMG and neuro follow up in which  significant increase in medication.  He feels this is helping although has difficulty finding comfortable sleep positions.  Has had f/u with neurosurgery and per patient is not a surgical candidate at this time.    Treatment occurred in 3.6-4.8 ft of warm water.  Patient entered and exited the pool via stairs and railing on left.  Patient with limited grasp in right hand, hand appears mildly edematous this session- moreso in digits than dorsum of hand.  Worked in modified plantigrade to provide loaded stretch to long left arm.  Patient reports stretching sensation and neuropathic pain in elbow, and forearm/hand.   Worked while back stabilized on wall to allow arms to "float to surface" to promote movement with less effort and more correct biomechanical reach pattern by use of buoyancy.  Patient able to walk in water using arm swing for added resistance. Encouraged movement of hands and wrists thru water to promote increased range of motion.  Patient with limited R wrist flexion with forearm pronated.  Improved active wrist flexion noted with forearm in neutral.  Consider carpal malalignment.  Provided gentle mobilization to carpal/metacarpal, and carpal/ forearm without adverse response.  Patient feels pool is beneficial and would like to continue.  Noted improved active assisted right elbow extension this session.     PATIENT EDUCATION: Education details: bed positioning, AA/ROM HEP for RUE Person educated: Patient Education method: Programmer, multimedia, Demonstration, Verbal cues, and Handouts Education comprehension: verbalized understanding, returned demonstration, and verbal cues required  HOME EXERCISE PROGRAM: 04/14/23: bed positioning, AA/ROM HEP for RUE   GOALS: Goals reviewed with patient? Yes  SHORT TERM GOALS: Target date: 04/30/23  Patient will complete HEP designed to improve Bilateral shoulder range of motion  Goal status: IN PROGRESS  2.  Patient will complete HEP designed to improve R  elbow flex/ext strength  Goal status: INITIAL  3.  Patient will complete HEP designed to improve range of motion for composite flexion and extension in right hand/wrist  Goal status: INITIAL  4.  Patient will demonstrate ability to pick up and retrieve lightweight object from table with RUE - e.g. cell phone  Goal status: INITIAL  5.  Patient will report improved awareness of positional changes/ support techniques to reduce UE pain.    Goal status: INITIAL  6.  Patient will demonstrate 50% composite active flex/ext in right hand  Goal status:  INITIAL  LONG TERM GOALS: Target date: 06/28/23  Patient will complete updated HEP designed on UE strengthening without increase in pain  Goal status: INITIAL  2.  Patient will demonstrate 10 lb grip strength R hand  Goal status: INITIAL  3.  Patient will demonstrate low reach RUE to obtain and release a 2 lb object x 5 without dropping  Goal status: INITIAL  4.  Patient will be able to cut his own food with modified independence  Goal status: INITIAL  5.  Patient will be independent with aquatic exercise program addressing BUE active movement and strength  Goal status: INITIAL  6.  Patient will dress himself with modified independence  Goal status: INITIAL  ASSESSMENT:  CLINICAL IMPRESSION: 62 y.o. right-handed male with a medical history of diabetes, hypothyroidism, lung cancer s/p chemo and radiation, emphysema, SAH, and HLD who presents to neurology clinic with the chief complaint of upper extremity pain and paresthesias,  seen today for initiation of HEP. Pt continues to benefit from O.T. to address below deficits  PERFORMANCE DEFICITS: in functional skills including dexterity, proprioception, sensation, tone, ROM, strength, pain, muscle spasms, Fine motor control, Gross motor control, endurance, and UE functional use  IMPAIRMENTS: are limiting patient from ADLs, IADLs, rest and sleep, work, and leisure.    CO-MORBIDITIES: may have co-morbidities  that affects occupational performance. Patient will benefit from skilled OT to address above impairments and improve overall function.  MODIFICATION OR ASSISTANCE TO COMPLETE EVALUATION: Min-Moderate modification of tasks or assist with assess necessary to complete an evaluation.  OT OCCUPATIONAL PROFILE AND HISTORY: Detailed assessment: Review of records and additional review of physical, cognitive, psychosocial history related to current functional performance.  CLINICAL DECISION MAKING: Moderate - several treatment options, min-mod task modification necessary  REHAB POTENTIAL: Good  EVALUATION COMPLEXITY: Moderate    PLAN:  OT FREQUENCY: 2x/week  OT DURATION: 12 weeks  PLANNED INTERVENTIONS: self care/ADL training, therapeutic exercise, therapeutic activity, neuromuscular re-education, manual therapy, passive range of motion, functional mobility training, aquatic therapy, splinting, electrical stimulation, ultrasound, paraffin, fluidotherapy, moist heat, cryotherapy, contrast bath, patient/family education, and DME and/or AE instructions  RECOMMENDED OTHER SERVICES: may have potential for counseling - discussed with patient - he feels he is ok now, but will keep it in mind  CONSULTED AND AGREED WITH PLAN OF CARE: Patient  PLAN FOR NEXT SESSION: Aquatic therapy to address gentle motion / gentle resistance with less influence of gravity.   Clinic - pt to bring in estim unit to practice using correct settings (possible TENS for shoulder pain, NMES for finger flexion), review HEP, add HEP for Rt wrist and hand  Sheran Lawless, OT 04/14/2023, 8:01 AM

## 2023-04-14 NOTE — Patient Instructions (Signed)
1) ELBOW: Extension / Chest Press (Frame)    Lie on back with knees bent. Straighten elbows to raise DOWEL. Hold _3__ seconds.  _10__ reps per set, _2-3__ sessions per day   2) Sitting up: elbows bent holding dowel - move forearms side to side (across body and away from body) keeping upper arms still, only moving forearms. Do 2-3 sessions per day  3) ELBOW: Flexion (Cane)    Hold cane with both hands. Bend and straighten elbows. Hold _3__ seconds. NO weight on cane. _10__ reps per set, _2-3__ sessions per day   4. Flexion (Passive)    Sitting upright, slide forearms forward along table, bending from the waist until a stretch is felt. Hold __3__ seconds. Repeat _10___ times. Then repeat side to side x 10 reps. Do _2-3___ sessions per day.  5. Sit to Rt side of table, place arm under towel w/ elbow bent at 90 degrees, try and straighten elbow x 10 reps. Do 2-3 sessions per day

## 2023-04-16 ENCOUNTER — Other Ambulatory Visit: Payer: Self-pay | Admitting: Radiation Therapy

## 2023-04-17 ENCOUNTER — Other Ambulatory Visit: Payer: Self-pay

## 2023-04-19 ENCOUNTER — Ambulatory Visit: Payer: BC Managed Care – PPO | Admitting: Occupational Therapy

## 2023-04-19 ENCOUNTER — Encounter: Payer: Self-pay | Admitting: Occupational Therapy

## 2023-04-19 DIAGNOSIS — M79601 Pain in right arm: Secondary | ICD-10-CM | POA: Diagnosis not present

## 2023-04-19 DIAGNOSIS — M79602 Pain in left arm: Secondary | ICD-10-CM | POA: Diagnosis not present

## 2023-04-19 DIAGNOSIS — M25641 Stiffness of right hand, not elsewhere classified: Secondary | ICD-10-CM | POA: Diagnosis not present

## 2023-04-19 DIAGNOSIS — R278 Other lack of coordination: Secondary | ICD-10-CM | POA: Diagnosis not present

## 2023-04-19 NOTE — Therapy (Signed)
OUTPATIENT OCCUPATIONAL THERAPY NEURO TREATMENT  Patient Name: Brian [name] MRN: [id number] DOB:[birth year], 62 y.o., male Today's Date: 04/19/2023  PCP: Keturah Barre REFERRING PROVIDER: Jacquelyne Balint  END OF SESSION:  OT End of Session - 04/19/23 1421     Visit Number 4    Number of Visits 25    Date for OT Re-Evaluation 06/28/23    Authorization Type [employer]  $1600 deductible has been met  20% coinsurance applies  $8500 oop, zero applied  VL combined with pt, ot, st: 60    OT Start Time 1328    OT Stop Time 1415    OT Time Calculation (min) 47 min    Equipment Utilized During Treatment floatation equipment    Activity Tolerance Patient tolerated treatment well    Behavior During Therapy WFL for tasks assessed/performed             Past Medical History:  Diagnosis Date   COPD (chronic obstructive pulmonary disease) (HCC)    Diabetes mellitus without complication (HCC)    Hypertension    nscl ca dx'd 08/2018   Pneumonia    Subarachnoid hemorrhage (HCC)    Past Surgical History:  Procedure Laterality Date   INGUINAL HERNIA REPAIR Right 06/24/2020   Procedure: RIGHT OPEN INGUINAL HERNIA REPAIR WITH MESH;  Surgeon: Kinsinger, De Blanch, MD;  Location: [facility];  Service: General;  Laterality: Right;   IR IVC FILTER PLMT / S&I Lenise Arena GUID/MOD SED  03/04/2020   IR IVC FILTER RETRIEVAL / S&I Lenise Arena GUID/MOD SED  08/15/2020   IR RADIOLOGIST EVAL & MGMT  08/06/2020   LEG SURGERY  age 22    left leg, from fracture   Patient Active Problem List   Diagnosis Date Noted   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   Recurrent right pleural effusion 07/23/2021   Hypothyroidism 10/24/2020   Acute hypoxemic respiratory failure (HCC) 05/19/2020   Acute respiratory failure with hypoxia (HCC) 05/18/2020   Essential hypertension 05/18/2020   Type 2 diabetes mellitus with hyperlipidemia (HCC) 05/18/2020   S/P thoracentesis 03/21/2020   Malignant neoplasm metastatic to brain (HCC)  03/05/2020   Calf swelling 02/21/2020   Rash 02/07/2020   Adenocarcinoma of right lung, stage 4 (HCC) 02/07/2020   Chronic cough 12/20/2019   Venous thrombosis 09/21/2019   Shortness of breath 09/07/2019   Headache 08/09/2019   Lobar pneumonia, unspecified organism (HCC) 03/07/2019   Centrilobular emphysema (HCC) 03/07/2019   Encounter for antineoplastic immunotherapy 11/10/2018   Adenocarcinoma of right lung, stage 3 (HCC) 08/25/2018   Goals of care, counseling/discussion 08/25/2018   Encounter for antineoplastic chemotherapy 08/25/2018   Mass of upper lobe of right lung 08/04/2018   Subarachnoid hemorrhage (HCC) 05/22/2015    ONSET DATE: 03/17/23 Referral  REFERRING DIAG: UE Pain and paresthesias  THERAPY DIAG:  Pain in right arm  Pain in left arm  Other lack of coordination  Stiffness of right hand, not elsewhere classified  Rationale for Evaluation and Treatment: Rehabilitation  SUBJECTIVE:   SUBJECTIVE STATEMENT:  Pain is worse at night. RUE worse, but mild pain LUE Pt accompanied by: self  PERTINENT HISTORY: Lung cancer w/ radiation, SAH 2016. Pt reports developing symptoms RUE this February and worsening symptoms in Rt hand more recently - MD thinks this is due to radiation approx 4 years ago  PRECAUTIONS: None  WEIGHT BEARING RESTRICTIONS: No  PAIN:  Are you having pain? Yes: NPRS scale: 5/10 Pain location: right arm Pain description: sharp, stabbing, throbbing, aching Aggravating factors: idle -  positional Relieving factors: heat, ice, gabapentin  FALLS: Has patient fallen in last 6 months? Yes. Number of falls 1- fell last night  LIVING ENVIRONMENT: Lives with: lives with their family and lives with their spouse Lives in: House/apartment Stairs: Yes: External: 2 steps; none Has following equipment at home:  has built in shower seat  PLOF: Independent with basic ADLs  PATIENT GOALS: to be able to get back to some level of use of my right  hand  OBJECTIVE:   HAND DOMINANCE: Right  ADLs: Overall ADLs: min assist and increased time Transfers/ambulation related to ADLs: Eating: min assist - wife assists with cutting.  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 driving - uses LUE only at this point Handwriting: unable   POSTURE COMMENTS:  forward head and posterior pelvic tilt   ACTIVITY TOLERANCE: Activity tolerance: reports more fatigue due to constant pain   UPPER EXTREMITY ROM:    Active ROM Right eval Left eval  Shoulder flexion 115 unable to maintain ext elbow 140  Shoulder abduction    Shoulder adduction    Shoulder extension    Shoulder internal rotation    Shoulder external rotation    Elbow flexion WFL   Elbow extension WFL   Wrist flexion    Wrist extension 30   Wrist ulnar deviation 10   Wrist radial deviation 5   Wrist pronation    Wrist supination    (Blank rows = not tested)  UPPER EXTREMITY MMT:     MMT Right eval Left eval  Shoulder flexion NT 4-/5  Shoulder abduction    Shoulder adduction    Shoulder extension    Shoulder internal rotation    Shoulder external rotation    Middle trapezius    Lower trapezius    Elbow flexion    Elbow extension    Wrist flexion 3   Wrist extension 3   Wrist ulnar deviation    Wrist radial deviation    Wrist pronation    Wrist supination    (Blank rows = not tested) RIGHT HAND - 15% COMPOSITE FLEXION     - 40% COMPOSITE EXTENSION HAND FUNCTION: Grip strength: Right: UNABLE lbs; Left: 31.9 lbs  COORDINATION: Finger Nose Finger test: Mild undershooting Left - right lacks strength/  proximal control to complete  SENSATION: WFL to testing, but reports heightened sensation - hypersensitive  EDEMA: Atrophy RUE - UPPER ARM, FOREARM  MUSCLE TONE: RUE: Moderate and Hypotonic and LUE: Within functional limits  COGNITION: Overall cognitive status: Within functional limits for tasks assessed.  Reports some concentration deficits which he attributes to pain  VISION: Subjective report: no changes Baseline vision: Wears glasses all the time Visual history:  nothing significant  VISION ASSESSMENT: Not tested  Patient has difficulty with following activities due to following visual impairments: NA  PERCEPTION: WFL  PRAXIS: WFL   TODAY'S TREATMENT:  DATE:  04/19/23 Patient seen for aquatic therapy visit.  Session occurred in lap pool versus therapy pool due to needed repairs.  Entered and exited independently using stairs and one rail.  Session occurred in 3.6 feet of water.  Patient reports overall slight decrease in pain (from constant 6 now to 5- pain worse in elbow distal to hand) Started session with water walking as warm up. Transitioning to exercise with use of buoyancy to aide with BUE range of motion and slight resistance (buoyancy assisted and resisted movement)  Modified plantigrade on pool steps to address shoulder range of motion and elbow extension.  Patient reports greatest challenge, and greatest degree of discomfort in elbow extension.  Patient encouraged to talk with neuro at next visit to possibly further examine elbow.   Educated patient in edema management in R hand, and also discussed positioning for improved sustained comfort at work - e.g. foam build up on office chair.  Patient with improving range of motion in wrist flexion noted this session.    04/14/23: Therapist discussed possible bed positioning options with patient and  provided handout.   Further assessed RUE w/ limited shoulder and elbow ROM and strength, and very minimal hand movement.  Provided appropriate HEP for neuro re-education for proximal RUE (sh and elbow) today and pt return demo of each with modifications prn - see pt instructions for details. Pt did require modifications to hold onto dowel Rt hand and shown how to make those modifications at home.   Pt also shown passive stretches to perform to Rt hand in composite flex, isolated MP flex, and isolated IP flex, and PIP extension to prevent stiffness/contractures    PATIENT EDUCATION: Education details: bed positioning, AA/ROM HEP for RUE, Edema management Person educated: Patient Education method: Explanation, Demonstration, Verbal cues, and Handouts Education comprehension: verbalized understanding, returned demonstration, and verbal cues required  HOME EXERCISE PROGRAM: 04/14/23: bed positioning, AA/ROM HEP for RUE   GOALS: Goals reviewed with patient? Yes  SHORT TERM GOALS: Target date: 04/30/23  Patient will complete HEP designed to improve Bilateral shoulder range of motion  Goal status: IN PROGRESS  2.  Patient will complete HEP designed to improve R elbow flex/ext strength  Goal status: INITIAL  3.  Patient will complete HEP designed to improve range of motion for composite flexion and extension in right hand/wrist  Goal status: INITIAL  4.  Patient will demonstrate ability to pick up and retrieve lightweight object from table with RUE - e.g. cell phone  Goal status: INITIAL  5.  Patient will report improved awareness of positional changes/ support techniques to reduce UE pain.    Goal status: INITIAL  6.  Patient will demonstrate 50% composite active flex/ext in right hand  Goal status: INITIAL  LONG TERM GOALS: Target date: 06/28/23  Patient will complete updated HEP designed on UE strengthening without increase in pain  Goal status: INITIAL  2.  Patient  will demonstrate 10 lb grip strength R hand  Goal status: INITIAL  3.  Patient will demonstrate low reach RUE to obtain and release a 2 lb object x 5 without dropping  Goal status: INITIAL  4.  Patient will be able to cut his own food with modified independence  Goal status: INITIAL  5.  Patient will be independent with aquatic exercise program addressing BUE active movement and strength  Goal status: INITIAL  6.  Patient will dress himself with modified independence  Goal status: INITIAL  ASSESSMENT:  CLINICAL IMPRESSION:  62 y.o. right-handed male with a medical history of diabetes, hypothyroidism, lung cancer s/p chemo and radiation, emphysema, SAH, and HLD who presents to neurology clinic with the chief complaint of upper extremity pain and paresthesias,  seen today for initiation of HEP. Pt continues to benefit from O.T. to address below deficits  PERFORMANCE DEFICITS: in functional skills including dexterity, proprioception, sensation, tone, ROM, strength, pain, muscle spasms, Fine motor control, Gross motor control, endurance, and UE functional use  IMPAIRMENTS: are limiting patient from ADLs, IADLs, rest and sleep, work, and leisure.   CO-MORBIDITIES: may have co-morbidities  that affects occupational performance. Patient will benefit from skilled OT to address above impairments and improve overall function.  MODIFICATION OR ASSISTANCE TO COMPLETE EVALUATION: Min-Moderate modification of tasks or assist with assess necessary to complete an evaluation.  OT OCCUPATIONAL PROFILE AND HISTORY: Detailed assessment: Review of records and additional review of physical, cognitive, psychosocial history related to current functional performance.  CLINICAL DECISION MAKING: Moderate - several treatment options, min-mod task modification necessary  REHAB POTENTIAL: Good  EVALUATION COMPLEXITY: Moderate    PLAN:  OT FREQUENCY: 2x/week  OT DURATION: 12 weeks  PLANNED  INTERVENTIONS: self care/ADL training, therapeutic exercise, therapeutic activity, neuromuscular re-education, manual therapy, passive range of motion, functional mobility training, aquatic therapy, splinting, electrical stimulation, ultrasound, paraffin, fluidotherapy, moist heat, cryotherapy, contrast bath, patient/family education, and DME and/or AE instructions  RECOMMENDED OTHER SERVICES: may have potential for counseling - discussed with patient - he feels he is ok now, but will keep it in mind  CONSULTED AND AGREED WITH PLAN OF CARE: Patient  PLAN FOR NEXT SESSION: Aquatic therapy to address gentle motion / gentle resistance with less influence of gravity.   Clinic - pt to bring in estim unit to practice using correct settings (possible TENS for shoulder pain, NMES for finger flexion), review HEP, add HEP for Rt wrist and hand  [name] Salina, OT 04/19/2023, 2:23 PM

## 2023-04-21 ENCOUNTER — Ambulatory Visit (INDEPENDENT_AMBULATORY_CARE_PROVIDER_SITE_OTHER): Payer: BC Managed Care – PPO | Admitting: Neurology

## 2023-04-21 ENCOUNTER — Encounter: Payer: Self-pay | Admitting: Neurology

## 2023-04-21 VITALS — BP 113/83 | HR 90 | Ht 68.0 in | Wt 185.0 lb

## 2023-04-21 DIAGNOSIS — G540 Brachial plexus disorders: Secondary | ICD-10-CM

## 2023-04-21 DIAGNOSIS — M792 Neuralgia and neuritis, unspecified: Secondary | ICD-10-CM

## 2023-04-21 DIAGNOSIS — M79601 Pain in right arm: Secondary | ICD-10-CM | POA: Diagnosis not present

## 2023-04-21 DIAGNOSIS — R29898 Other symptoms and signs involving the musculoskeletal system: Secondary | ICD-10-CM

## 2023-04-21 DIAGNOSIS — C3491 Malignant neoplasm of unspecified part of right bronchus or lung: Secondary | ICD-10-CM

## 2023-04-21 DIAGNOSIS — M79602 Pain in left arm: Secondary | ICD-10-CM

## 2023-04-21 DIAGNOSIS — M6259 Muscle wasting and atrophy, not elsewhere classified, multiple sites: Secondary | ICD-10-CM | POA: Diagnosis not present

## 2023-04-21 NOTE — Patient Instructions (Signed)
Please call if you decide you want to change medications.  See pain management as planned.  The physicians and staff at [facility] are committed to providing excellent care. You may receive a survey requesting feedback about your experience at our office. We strive to receive "very good" responses to the survey questions. If you feel that your experience would prevent you from giving the office a "very good " response, please contact our office to try to remedy the situation. We may be reached at [phone]. Thank you for taking the time out of your busy day to complete the survey.  Jacquelyne Balint, MD [facility]

## 2023-04-22 ENCOUNTER — Ambulatory Visit: Payer: BC Managed Care – PPO | Admitting: Occupational Therapy

## 2023-04-22 ENCOUNTER — Encounter: Payer: Self-pay | Admitting: Occupational Therapy

## 2023-04-22 DIAGNOSIS — R278 Other lack of coordination: Secondary | ICD-10-CM | POA: Diagnosis not present

## 2023-04-22 DIAGNOSIS — M79602 Pain in left arm: Secondary | ICD-10-CM | POA: Diagnosis not present

## 2023-04-22 DIAGNOSIS — M79601 Pain in right arm: Secondary | ICD-10-CM | POA: Diagnosis not present

## 2023-04-22 DIAGNOSIS — M25641 Stiffness of right hand, not elsewhere classified: Secondary | ICD-10-CM

## 2023-04-22 NOTE — Patient Instructions (Addendum)
  MP / PIP / DIP Composite Flexion (Passive Stretch)    Use other hand to bend __all____ fingers at all three joints. Hold __10__ seconds. Repeat __5__ times. Do _3-4___ sessions per day.   SELF ASSISTED: Finger Extension    Use one hand to open fingers on other hand. Hold 10 sec. __5_ reps per set, _3-4__ sets per day (do NOT hyperextend big knuckles)    MP Flexion (Passive)    Use other hand to gently bend _all_____ fingers at large knuckle. Keep wrist neutral! Hold _10___ seconds. Repeat _5___ times. Do __3-4__ sessions per day.   PIP / DIP Composite Flexion (Passive Stretch)    Use other hand to bend middle and tip joints of __all____ finger. Keep big knuckles straight. Hold __10__ seconds. Repeat _5___ times. Do _3-4___ sessions per day.

## 2023-04-22 NOTE — Therapy (Signed)
OUTPATIENT OCCUPATIONAL THERAPY NEURO TREATMENT  Patient Name: Brian [name] MRN: [id number] DOB:[birth year], 62 y.o., male Today's Date: 04/22/2023  PCP: Keturah Barre REFERRING PROVIDER: Jacquelyne Balint  END OF SESSION:  OT End of Session - 04/22/23 0808     Visit Number 5    Number of Visits 25    Date for OT Re-Evaluation 06/28/23    Authorization Type [employer]  $1600 deductible has been met  20% coinsurance applies  $8500 oop, zero applied  VL combined with pt, ot, st: 60    OT Start Time 0803    OT Stop Time 0845    OT Time Calculation (min) 42 min    Equipment Utilized During Treatment floatation equipment    Activity Tolerance Patient tolerated treatment well    Behavior During Therapy WFL for tasks assessed/performed             Past Medical History:  Diagnosis Date   COPD (chronic obstructive pulmonary disease) (HCC)    Diabetes mellitus without complication (HCC)    Hypertension    nscl ca dx'd 08/2018   Pneumonia    Subarachnoid hemorrhage (HCC)    Past Surgical History:  Procedure Laterality Date   INGUINAL HERNIA REPAIR Right 06/24/2020   Procedure: RIGHT OPEN INGUINAL HERNIA REPAIR WITH MESH;  Surgeon: Kinsinger, De Blanch, MD;  Location: [facility];  Service: General;  Laterality: Right;   IR IVC FILTER PLMT / S&I Lenise Arena GUID/MOD SED  03/04/2020   IR IVC FILTER RETRIEVAL / S&I Lenise Arena GUID/MOD SED  08/15/2020   IR RADIOLOGIST EVAL & MGMT  08/06/2020   LEG SURGERY  age 45    left leg, from fracture   Patient Active Problem List   Diagnosis Date Noted   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   Recurrent right pleural effusion 07/23/2021   Hypothyroidism 10/24/2020   Acute hypoxemic respiratory failure (HCC) 05/19/2020   Acute respiratory failure with hypoxia (HCC) 05/18/2020   Essential hypertension 05/18/2020   Type 2 diabetes mellitus with hyperlipidemia (HCC) 05/18/2020   S/P thoracentesis 03/21/2020   Malignant neoplasm metastatic to brain (HCC)  03/05/2020   Calf swelling 02/21/2020   Rash 02/07/2020   Adenocarcinoma of right lung, stage 4 (HCC) 02/07/2020   Chronic cough 12/20/2019   Venous thrombosis 09/21/2019   Shortness of breath 09/07/2019   Headache 08/09/2019   Lobar pneumonia, unspecified organism (HCC) 03/07/2019   Centrilobular emphysema (HCC) 03/07/2019   Encounter for antineoplastic immunotherapy 11/10/2018   Adenocarcinoma of right lung, stage 3 (HCC) 08/25/2018   Goals of care, counseling/discussion 08/25/2018   Encounter for antineoplastic chemotherapy 08/25/2018   Mass of upper lobe of right lung 08/04/2018   Subarachnoid hemorrhage (HCC) 05/22/2015    ONSET DATE: 03/17/23 Referral  REFERRING DIAG: UE Pain and paresthesias  THERAPY DIAG:  Pain in right arm  Pain in left arm  Stiffness of right hand, not elsewhere classified  Other lack of coordination  Rationale for Evaluation and Treatment: Rehabilitation  SUBJECTIVE:   SUBJECTIVE STATEMENT:  I'm still waking up about 6 times a night, but now able to sleep in between Pt accompanied by: self  PERTINENT HISTORY: Lung cancer w/ radiation, SAH 2016. Pt reports developing symptoms RUE this February and worsening symptoms in Rt hand more recently - MD thinks this is due to radiation approx 4 years ago  PRECAUTIONS: None  WEIGHT BEARING RESTRICTIONS: No  PAIN:  Are you having pain? Yes: NPRS scale: 4/10 Pain location: right arm Pain description: sharp, stabbing,  throbbing, aching Aggravating factors: idle - positional Relieving factors: heat, ice, gabapentin  FALLS: Has patient fallen in last 6 months? Yes. Number of falls 1- fell last night  LIVING ENVIRONMENT: Lives with: lives with their family and lives with their spouse Lives in: House/apartment Stairs: Yes: External: 2 steps; none Has following equipment at home:  has built in shower seat  PLOF: Independent with basic ADLs  PATIENT GOALS: to be able to get back to some level of  use of my right hand  OBJECTIVE:   HAND DOMINANCE: Right  ADLs: Overall ADLs: min assist and increased time Transfers/ambulation related to ADLs: Eating: min assist - wife assists with cutting.  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 driving - uses LUE only at this point Handwriting: unable   POSTURE COMMENTS:  forward head and posterior pelvic tilt   ACTIVITY TOLERANCE: Activity tolerance: reports more fatigue due to constant pain   UPPER EXTREMITY ROM:    Active ROM Right eval Left eval  Shoulder flexion 115 unable to maintain ext elbow 140  Shoulder abduction    Shoulder adduction    Shoulder extension    Shoulder internal rotation    Shoulder external rotation    Elbow flexion WFL   Elbow extension WFL   Wrist flexion    Wrist extension 30   Wrist ulnar deviation 10   Wrist radial deviation 5   Wrist pronation    Wrist supination    (Blank rows = not tested)  UPPER EXTREMITY MMT:     MMT Right eval Left eval  Shoulder flexion NT 4-/5  Shoulder abduction    Shoulder adduction    Shoulder extension    Shoulder internal rotation    Shoulder external rotation    Middle trapezius    Lower trapezius    Elbow flexion    Elbow extension    Wrist flexion 3   Wrist extension 3   Wrist ulnar deviation    Wrist radial deviation    Wrist pronation    Wrist supination    (Blank rows = not tested) RIGHT HAND - 15% COMPOSITE FLEXION     - 40% COMPOSITE EXTENSION HAND FUNCTION: Grip strength: Right: UNABLE lbs; Left: 31.9 lbs  COORDINATION: Finger Nose Finger test: Mild undershooting Left - right  lacks strength/ proximal control to complete  SENSATION: WFL to testing, but reports heightened sensation - hypersensitive  EDEMA: Atrophy RUE - UPPER ARM, FOREARM  MUSCLE TONE: RUE: Moderate and Hypotonic and LUE: Within functional limits  COGNITION: Overall cognitive status: Within functional limits for tasks assessed.  Reports some concentration deficits which he attributes to pain  VISION: Subjective report: no changes Baseline vision: Wears glasses all the time Visual history:  nothing significant  VISION ASSESSMENT: Not tested  Patient has difficulty with following activities due to following visual impairments: NA  PERCEPTION: WFL  PRAXIS: WFL   TODAY'S TREATMENT:  DATE:   04/22/23 Reviewed previously issued HEP issued on 9/11 - pt demo each x10, and added horizontal abd/add in gravity elim AA/ROM along table.   Issued HEP for passive stretches to Rt hand including: composite flex, composite extension, isolated MP flex, and isolated IP flex - see pt instructions for details  Therapist also discussed with pt edema management strategies for Rt hand including: elevation, retrograde massage, and compression glove for night. Issued handout  Pt did not bring in his own TENS/NMES combo unit, however did apply clinic NMES unit for finger flexion at 50 rate, 250 pw, 10 on/8 off cycle x 10 min. Ramp time was 2 sec up and 2 sec down but may need to increase to 3 sec up for increased comfort.   04/19/23 Patient seen for aquatic therapy visit.  Session occurred in lap pool versus therapy pool due to needed repairs.  Entered and exited independently using stairs and one rail.  Session occurred in 3.6 feet of water.  Patient reports overall slight decrease in pain (from constant 6 now to 5- pain worse in elbow distal to hand) Started session with water walking as  warm up. Transitioning to exercise with use of buoyancy to aide with BUE range of motion and slight resistance (buoyancy assisted and resisted movement)  Modified plantigrade on pool steps to address shoulder range of motion and elbow extension.  Patient reports greatest challenge, and greatest degree of discomfort in elbow extension.  Patient encouraged to talk with neuro at next visit to possibly further examine elbow.   Educated patient in edema management in R hand, and also discussed positioning for improved sustained comfort at work - e.g. foam build up on office chair.  Patient with improving range of motion in wrist flexion noted this session.     PATIENT EDUCATION: Education details: review AA/ROM HEP for RUE, Edema management, Rt hand HEP Person educated: Patient Education method: Explanation, Demonstration, Verbal cues, and Handouts Education comprehension: verbalized understanding, returned demonstration, and verbal cues required  HOME EXERCISE PROGRAM: 04/14/23: bed positioning, AA/ROM HEP for RUE 04/22/23: passive stretching HEP for Rt hand, edema management strategies   GOALS: Goals reviewed with patient? Yes  SHORT TERM GOALS: Target date: 04/30/23  Patient will complete HEP designed to improve Bilateral shoulder range of motion  Goal status: IN PROGRESS  2.  Patient will complete HEP designed to improve R elbow flex/ext strength  Goal status: IN PROGRESS  3.  Patient will complete HEP designed to improve range of motion for composite flexion and extension in right hand/wrist  Goal status: IN PROGRESS  4.  Patient will demonstrate ability to pick up and retrieve lightweight object from table with RUE - e.g. cell phone  Goal status: INITIAL  5.  Patient will report improved awareness of positional changes/ support techniques to reduce UE pain.    Goal status: IN PROGRESS  6.  Patient will demonstrate 50% composite active flex/ext in right hand  Goal status: IN  PROGRESS   LONG TERM GOALS: Target date: 06/28/23  Patient will complete updated HEP designed on UE strengthening without increase in pain  Goal status: INITIAL  2.  Patient will demonstrate 10 lb grip strength R hand  Goal status: INITIAL  3.  Patient will demonstrate low reach RUE to obtain and release a 2 lb object x 5 without dropping  Goal status: INITIAL  4.  Patient will be able to cut his own food with modified independence  Goal status: INITIAL  5.  Patient will be independent with aquatic exercise program addressing BUE active movement and strength  Goal status: INITIAL  6.  Patient will dress himself with modified independence  Goal status: INITIAL  ASSESSMENT:  CLINICAL IMPRESSION: 62 y.o. right-handed male with a medical history of diabetes, hypothyroidism, lung cancer s/p chemo and radiation, emphysema, SAH, and HLD who presents to neurology clinic with the chief complaint of upper extremity pain and paresthesias. Pt progressing towards all STG'S. Pt continues to benefit from O.T. to address below deficits  PERFORMANCE DEFICITS: in functional skills including dexterity, proprioception, sensation, tone, ROM, strength, pain, muscle spasms, Fine motor control, Gross motor control, endurance, and UE functional use  IMPAIRMENTS: are limiting patient from ADLs, IADLs, rest and sleep, work, and leisure.   CO-MORBIDITIES: may have co-morbidities  that affects occupational performance. Patient will benefit from skilled OT to address above impairments and improve overall function.  MODIFICATION OR ASSISTANCE TO COMPLETE EVALUATION: Min-Moderate modification of tasks or assist with assess necessary to complete an evaluation.  OT OCCUPATIONAL PROFILE AND HISTORY: Detailed assessment: Review of records and additional review of physical, cognitive, psychosocial history related to current functional performance.  CLINICAL DECISION MAKING: Moderate - several treatment  options, min-mod task modification necessary  REHAB POTENTIAL: Good  EVALUATION COMPLEXITY: Moderate    PLAN:  OT FREQUENCY: 2x/week  OT DURATION: 12 weeks  PLANNED INTERVENTIONS: self care/ADL training, therapeutic exercise, therapeutic activity, neuromuscular re-education, manual therapy, passive range of motion, functional mobility training, aquatic therapy, splinting, electrical stimulation, ultrasound, paraffin, fluidotherapy, moist heat, cryotherapy, contrast bath, patient/family education, and DME and/or AE instructions  RECOMMENDED OTHER SERVICES: may have potential for counseling - discussed with patient - he feels he is ok now, but will keep it in mind  CONSULTED AND AGREED WITH PLAN OF CARE: Patient  PLAN FOR NEXT SESSION: Aquatic therapy to address gentle motion / gentle resistance with less influence of gravity.   Clinic - pt to bring in estim unit to practice using correct settings (possible TENS for shoulder pain, NMES for finger flexion), continue P/ROM, AA/ROM, and A/ROM as tolerated RUE.   Sheran Lawless, OT 04/22/2023, 8:09 AM

## 2023-04-26 ENCOUNTER — Encounter: Payer: Self-pay | Admitting: Occupational Therapy

## 2023-04-26 ENCOUNTER — Ambulatory Visit: Payer: BC Managed Care – PPO | Admitting: Occupational Therapy

## 2023-04-26 DIAGNOSIS — M79601 Pain in right arm: Secondary | ICD-10-CM

## 2023-04-26 DIAGNOSIS — M79602 Pain in left arm: Secondary | ICD-10-CM

## 2023-04-26 DIAGNOSIS — M25641 Stiffness of right hand, not elsewhere classified: Secondary | ICD-10-CM | POA: Diagnosis not present

## 2023-04-26 DIAGNOSIS — R278 Other lack of coordination: Secondary | ICD-10-CM

## 2023-04-26 NOTE — Therapy (Signed)
OUTPATIENT OCCUPATIONAL THERAPY NEURO TREATMENT  Patient Name: Brian [name] MRN: [id number] DOB:08-Aug-1960, 62 y.o., male Today's Date: 04/26/2023  PCP: Keturah Barre REFERRING PROVIDER: Jacquelyne Balint  END OF SESSION:  OT End of Session - 04/26/23 2037     Visit Number 6    Number of Visits 25    Date for OT Re-Evaluation 06/28/23    Authorization Type BCBS  $1600 deductible has been met  20% coinsurance applies  $8500 oop, zero applied  VL combined with pt, ot, st: 60    OT Start Time 1540    OT Stop Time 1623    OT Time Calculation (min) 43 min    Equipment Utilized During Treatment floatation equipment    Activity Tolerance Patient tolerated treatment well    Behavior During Therapy WFL for tasks assessed/performed             Past Medical History:  Diagnosis Date   COPD (chronic obstructive pulmonary disease) (HCC)    Diabetes mellitus without complication (HCC)    Hypertension    nscl ca dx'd 08/2018   Pneumonia    Subarachnoid hemorrhage (HCC)    Past Surgical History:  Procedure Laterality Date   INGUINAL HERNIA REPAIR Right 06/24/2020   Procedure: RIGHT OPEN INGUINAL HERNIA REPAIR WITH MESH;  Surgeon: Kinsinger, De Blanch, MD;  Location: [facility];  Service: General;  Laterality: Right;   IR IVC FILTER PLMT / S&I Lenise Arena GUID/MOD SED  03/04/2020   IR IVC FILTER RETRIEVAL / S&I Lenise Arena GUID/MOD SED  08/15/2020   IR RADIOLOGIST EVAL & MGMT  08/06/2020   LEG SURGERY  age 38    left leg, from fracture   Patient Active Problem List   Diagnosis Date Noted   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   Recurrent right pleural effusion 07/23/2021   Hypothyroidism 10/24/2020   Acute hypoxemic respiratory failure (HCC) 05/19/2020   Acute respiratory failure with hypoxia (HCC) 05/18/2020   Essential hypertension 05/18/2020   Type 2 diabetes mellitus with hyperlipidemia (HCC) 05/18/2020   S/P thoracentesis 03/21/2020   Malignant neoplasm metastatic to brain (HCC)  03/05/2020   Calf swelling 02/21/2020   Rash 02/07/2020   Adenocarcinoma of right lung, stage 4 (HCC) 02/07/2020   Chronic cough 12/20/2019   Venous thrombosis 09/21/2019   Shortness of breath 09/07/2019   Headache 08/09/2019   Lobar pneumonia, unspecified organism (HCC) 03/07/2019   Centrilobular emphysema (HCC) 03/07/2019   Encounter for antineoplastic immunotherapy 11/10/2018   Adenocarcinoma of right lung, stage 3 (HCC) 08/25/2018   Goals of care, counseling/discussion 08/25/2018   Encounter for antineoplastic chemotherapy 08/25/2018   Mass of upper lobe of right lung 08/04/2018   Subarachnoid hemorrhage (HCC) 05/22/2015    ONSET DATE: 03/17/23 Referral  REFERRING DIAG: UE Pain and paresthesias  THERAPY DIAG:  Pain in right arm  Pain in left arm  Stiffness of right hand, not elsewhere classified  Other lack of coordination  Rationale for Evaluation and Treatment: Rehabilitation  SUBJECTIVE:   SUBJECTIVE STATEMENT:  I'm still waking up about 6 times a night, but now able to sleep in between Pt accompanied by: self  PERTINENT HISTORY: Lung cancer w/ radiation, SAH 2016. Pt reports developing symptoms RUE this February and worsening symptoms in Rt hand more recently - MD thinks this is due to radiation approx 4 years ago  PRECAUTIONS: None  WEIGHT BEARING RESTRICTIONS: No  PAIN:  Are you having pain? Yes: NPRS scale: 4/10 Pain location: right arm Pain description: sharp, stabbing,  throbbing, aching Aggravating factors: idle - positional Relieving factors: heat, ice, gabapentin  FALLS: Has patient fallen in last 6 months? Yes. Number of falls 1- fell last night  LIVING ENVIRONMENT: Lives with: lives with their family and lives with their spouse Lives in: House/apartment Stairs: Yes: External: 2 steps; none Has following equipment at home:  has built in shower seat  PLOF: Independent with basic ADLs  PATIENT GOALS: to be able to get back to some level of  use of my right hand  OBJECTIVE:   HAND DOMINANCE: Right  ADLs: Overall ADLs: min assist and increased time Transfers/ambulation related to ADLs: Eating: min assist - wife assists with cutting.  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 driving - uses LUE only at this point Handwriting: unable   POSTURE COMMENTS:  forward head and posterior pelvic tilt   ACTIVITY TOLERANCE: Activity tolerance: reports more fatigue due to constant pain   UPPER EXTREMITY ROM:    Active ROM Right eval Left eval  Shoulder flexion 115 unable to maintain ext elbow 140  Shoulder abduction    Shoulder adduction    Shoulder extension    Shoulder internal rotation    Shoulder external rotation    Elbow flexion WFL   Elbow extension WFL   Wrist flexion    Wrist extension 30   Wrist ulnar deviation 10   Wrist radial deviation 5   Wrist pronation    Wrist supination    (Blank rows = not tested)  UPPER EXTREMITY MMT:     MMT Right eval Left eval  Shoulder flexion NT 4-/5  Shoulder abduction    Shoulder adduction    Shoulder extension    Shoulder internal rotation    Shoulder external rotation    Middle trapezius    Lower trapezius    Elbow flexion    Elbow extension    Wrist flexion 3   Wrist extension 3   Wrist ulnar deviation    Wrist radial deviation    Wrist pronation    Wrist supination    (Blank rows = not tested) RIGHT HAND - 15% COMPOSITE FLEXION     - 40% COMPOSITE EXTENSION HAND FUNCTION: Grip strength: Right: UNABLE lbs; Left: 31.9 lbs  COORDINATION: Finger Nose Finger test: Mild undershooting Left - right  lacks strength/ proximal control to complete  SENSATION: WFL to testing, but reports heightened sensation - hypersensitive  EDEMA: Atrophy RUE - UPPER ARM, FOREARM  MUSCLE TONE: RUE: Moderate and Hypotonic and LUE: Within functional limits  COGNITION: Overall cognitive status: Within functional limits for tasks assessed.  Reports some concentration deficits which he attributes to pain  VISION: Subjective report: no changes Baseline vision: Wears glasses all the time Visual history:  nothing significant  VISION ASSESSMENT: Not tested  Patient has difficulty with following activities due to following visual impairments: NA  PERCEPTION: WFL  PRAXIS: WFL   TODAY'S TREATMENT:  DATE:   04/26/23 Patient seen for aquatic therapy visit.  Entered and exited independently using stairs and one rail.  Session occurred in 3.6-4.5 feet of water.  Started session with water walking as warm up. Provided soft tissue mobilization to posterior/superior shoulder, followed with scapular mobilization exercises.   Modified plantigrade on pool steps to address shoulder range of motion and elbow extension.  Provided manual therapy to hand carpal/metacarpal/forearm mobilization and composite digit flex/ext- passive to active assisted.       04/22/23 Reviewed previously issued HEP issued on 9/11 - pt demo each x10, and added horizontal abd/add in gravity elim AA/ROM along table.   Issued HEP for passive stretches to Rt hand including: composite flex, composite extension, isolated MP flex, and isolated IP flex - see pt instructions for details  Therapist also discussed with pt edema management strategies for Rt hand including: elevation, retrograde massage, and compression glove for night. Issued handout  Pt did not bring in his own TENS/NMES combo unit, however did apply clinic NMES  unit for finger flexion at 50 rate, 250 pw, 10 on/8 off cycle x 10 min. Ramp time was 2 sec up and 2 sec down but may need to increase to 3 sec up for increased comfort.     PATIENT EDUCATION: Education details: review AA/ROM HEP for RUE, Edema management, Rt hand HEP Person educated: Patient Education method: Explanation, Demonstration, Verbal cues, and Handouts Education comprehension: verbalized understanding, returned demonstration, and verbal cues required  HOME EXERCISE PROGRAM: 04/14/23: bed positioning, AA/ROM HEP for RUE 04/22/23: passive stretching HEP for Rt hand, edema management strategies   GOALS: Goals reviewed with patient? Yes  SHORT TERM GOALS: Target date: 04/30/23  Patient will complete HEP designed to improve Bilateral shoulder range of motion  Goal status: IN PROGRESS  2.  Patient will complete HEP designed to improve R elbow flex/ext strength  Goal status: IN PROGRESS  3.  Patient will complete HEP designed to improve range of motion for composite flexion and extension in right hand/wrist  Goal status: IN PROGRESS  4.  Patient will demonstrate ability to pick up and retrieve lightweight object from table with RUE - e.g. cell phone  Goal status: INITIAL  5.  Patient will report improved awareness of positional changes/ support techniques to reduce UE pain.    Goal status: IN PROGRESS  6.  Patient will demonstrate 50% composite active flex/ext in right hand  Goal status: IN PROGRESS   LONG TERM GOALS: Target date: 06/28/23  Patient will complete updated HEP designed on UE strengthening without increase in pain  Goal status: INITIAL  2.  Patient will demonstrate 10 lb grip strength R hand  Goal status: INITIAL  3.  Patient will demonstrate low reach RUE to obtain and release a 2 lb object x 5 without dropping  Goal status: INITIAL  4.  Patient will be able to cut his own food with modified independence  Goal status: INITIAL  5.  Patient  will be independent with aquatic exercise program addressing BUE active movement and strength  Goal status: INITIAL  6.  Patient will dress himself with modified independence  Goal status: INITIAL  ASSESSMENT:  CLINICAL IMPRESSION: 62 y.o. right-handed male with a medical history of diabetes, hypothyroidism, lung cancer s/p chemo and radiation, emphysema, SAH, and HLD who presents to neurology clinic with the chief complaint of upper extremity pain and paresthesias. Pt progressing towards all STG'S. Pt continues to benefit from O.T. to address below deficits  PERFORMANCE DEFICITS: in functional skills including dexterity, proprioception, sensation, tone, ROM, strength, pain, muscle spasms, Fine motor control, Gross motor control, endurance, and UE functional use  IMPAIRMENTS: are limiting patient from ADLs, IADLs, rest and sleep, work, and leisure.   CO-MORBIDITIES: may have co-morbidities  that affects occupational performance. Patient will benefit from skilled OT to address above impairments and improve overall function.  MODIFICATION OR ASSISTANCE TO COMPLETE EVALUATION: Min-Moderate modification of tasks or assist with assess necessary to complete an evaluation.  OT OCCUPATIONAL PROFILE AND HISTORY: Detailed assessment: Review of records and additional review of physical, cognitive, psychosocial history related to current functional performance.  CLINICAL DECISION MAKING: Moderate - several treatment options, min-mod task modification necessary  REHAB POTENTIAL: Good  EVALUATION COMPLEXITY: Moderate    PLAN:  OT FREQUENCY: 2x/week  OT DURATION: 12 weeks  PLANNED INTERVENTIONS: self care/ADL training, therapeutic exercise, therapeutic activity, neuromuscular re-education, manual therapy, passive range of motion, functional mobility training, aquatic therapy, splinting, electrical stimulation, ultrasound, paraffin, fluidotherapy, moist heat, cryotherapy, contrast bath,  patient/family education, and DME and/or AE instructions  RECOMMENDED OTHER SERVICES: may have potential for counseling - discussed with patient - he feels he is ok now, but will keep it in mind  CONSULTED AND AGREED WITH PLAN OF CARE: Patient  PLAN FOR NEXT SESSION: Aquatic therapy to address gentle motion / gentle resistance with less influence of gravity.   Clinic - pt to bring in estim unit to practice using correct settings (possible TENS for shoulder pain, NMES for finger flexion), continue P/ROM, AA/ROM, and A/ROM as tolerated RUE.   [name] Salina, OT 04/26/2023, 8:39 PM

## 2023-04-28 ENCOUNTER — Encounter: Payer: Self-pay | Admitting: Occupational Therapy

## 2023-04-28 ENCOUNTER — Ambulatory Visit: Payer: BC Managed Care – PPO | Admitting: Occupational Therapy

## 2023-04-28 DIAGNOSIS — M79601 Pain in right arm: Secondary | ICD-10-CM | POA: Diagnosis not present

## 2023-04-28 DIAGNOSIS — M25641 Stiffness of right hand, not elsewhere classified: Secondary | ICD-10-CM

## 2023-04-28 DIAGNOSIS — R278 Other lack of coordination: Secondary | ICD-10-CM | POA: Diagnosis not present

## 2023-04-28 DIAGNOSIS — M79602 Pain in left arm: Secondary | ICD-10-CM | POA: Diagnosis not present

## 2023-04-28 NOTE — Therapy (Addendum)
OUTPATIENT OCCUPATIONAL THERAPY NEURO TREATMENT  Patient Name: Brian [name] MRN: [id number] DOB:1961-04-13, 62 y.o., male Today's Date: 04/28/2023  PCP: Keturah Barre REFERRING PROVIDER: Jacquelyne Balint  END OF SESSION:  OT End of Session - 04/28/23 0810     Visit Number 7    Number of Visits 25    Date for OT Re-Evaluation 06/28/23    Authorization Type [employer]  $1600 deductible has been met  20% coinsurance applies  $8500 oop, zero applied  VL combined with pt, ot, st: 60    OT Start Time 0803    OT Stop Time 0849    OT Time Calculation (min) 46 min    Equipment Utilized During Treatment floatation equipment    Activity Tolerance Patient tolerated treatment well    Behavior During Therapy WFL for tasks assessed/performed             Past Medical History:  Diagnosis Date   COPD (chronic obstructive pulmonary disease) (HCC)    Diabetes mellitus without complication (HCC)    Hypertension    nscl ca dx'd 08/2018   Pneumonia    Subarachnoid hemorrhage (HCC)    Past Surgical History:  Procedure Laterality Date   INGUINAL HERNIA REPAIR Right 06/24/2020   Procedure: RIGHT OPEN INGUINAL HERNIA REPAIR WITH MESH;  Surgeon: Kinsinger, De Blanch, MD;  Location: [facility];  Service: General;  Laterality: Right;   IR IVC FILTER PLMT / S&I Lenise Arena GUID/MOD SED  03/04/2020   IR IVC FILTER RETRIEVAL / S&I Lenise Arena GUID/MOD SED  08/15/2020   IR RADIOLOGIST EVAL & MGMT  08/06/2020   LEG SURGERY  age 63    left leg, from fracture   Patient Active Problem List   Diagnosis Date Noted   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   Recurrent right pleural effusion 07/23/2021   Hypothyroidism 10/24/2020   Acute hypoxemic respiratory failure (HCC) 05/19/2020   Acute respiratory failure with hypoxia (HCC) 05/18/2020   Essential hypertension 05/18/2020   Type 2 diabetes mellitus with hyperlipidemia (HCC) 05/18/2020   S/P thoracentesis 03/21/2020   Malignant neoplasm metastatic to brain (HCC)  03/05/2020   Calf swelling 02/21/2020   Rash 02/07/2020   Adenocarcinoma of right lung, stage 4 (HCC) 02/07/2020   Chronic cough 12/20/2019   Venous thrombosis 09/21/2019   Shortness of breath 09/07/2019   Headache 08/09/2019   Lobar pneumonia, unspecified organism (HCC) 03/07/2019   Centrilobular emphysema (HCC) 03/07/2019   Encounter for antineoplastic immunotherapy 11/10/2018   Adenocarcinoma of right lung, stage 3 (HCC) 08/25/2018   Goals of care, counseling/discussion 08/25/2018   Encounter for antineoplastic chemotherapy 08/25/2018   Mass of upper lobe of right lung 08/04/2018   Subarachnoid hemorrhage (HCC) 05/22/2015    ONSET DATE: 03/17/23 Referral  REFERRING DIAG: UE Pain and paresthesias  THERAPY DIAG:  Pain in right arm  Pain in left arm  Stiffness of right hand, not elsewhere classified  Other lack of coordination  Rationale for Evaluation and Treatment: Rehabilitation  SUBJECTIVE:   SUBJECTIVE STATEMENT:  Sleep is a little better but last night wasn't good Pt accompanied by: self  PERTINENT HISTORY: Lung cancer w/ radiation, SAH 2016. Pt reports developing symptoms RUE this February and worsening symptoms in Rt hand more recently - MD thinks this is due to radiation approx 4 years ago  PRECAUTIONS: None  WEIGHT BEARING RESTRICTIONS: No  PAIN:  Are you having pain? Yes: NPRS scale: 8/10 Pain location: right arm - mostly hand but shoulder as well Pain description: sharp,  stabbing, throbbing, aching Aggravating factors: idle - positional Relieving factors: heat, ice, gabapentin  FALLS: Has patient fallen in last 6 months? Yes. Number of falls 1- fell last night  LIVING ENVIRONMENT: Lives with: lives with their family and lives with their spouse Lives in: House/apartment Stairs: Yes: External: 2 steps; none Has following equipment at home:  has built in shower seat  PLOF: Independent with basic ADLs  PATIENT GOALS: to be able to get back to some  level of use of my right hand  OBJECTIVE:   HAND DOMINANCE: Right  ADLs: Overall ADLs: min assist and increased time Transfers/ambulation related to ADLs: Eating: min assist - wife assists with cutting.  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 driving - uses LUE only at this point Handwriting: unable   POSTURE COMMENTS:  forward head and posterior pelvic tilt   ACTIVITY TOLERANCE: Activity tolerance: reports more fatigue due to constant pain   UPPER EXTREMITY ROM:    Active ROM Right eval Left eval  Shoulder flexion 115 unable to maintain ext elbow 140  Shoulder abduction    Shoulder adduction    Shoulder extension    Shoulder internal rotation    Shoulder external rotation    Elbow flexion WFL   Elbow extension WFL   Wrist flexion    Wrist extension 30   Wrist ulnar deviation 10   Wrist radial deviation 5   Wrist pronation    Wrist supination    (Blank rows = not tested)  UPPER EXTREMITY MMT:     MMT Right eval Left eval  Shoulder flexion NT 4-/5  Shoulder abduction    Shoulder adduction    Shoulder extension    Shoulder internal rotation    Shoulder external rotation    Middle trapezius    Lower trapezius    Elbow flexion    Elbow extension    Wrist flexion 3   Wrist extension 3   Wrist ulnar deviation    Wrist radial deviation    Wrist pronation    Wrist supination    (Blank rows = not tested) RIGHT HAND - 15% COMPOSITE FLEXION     - 40% COMPOSITE EXTENSION HAND FUNCTION: Grip strength: Right: UNABLE lbs; Left: 31.9 lbs  COORDINATION: Finger Nose Finger test: Mild undershooting Left -  right lacks strength/ proximal control to complete  SENSATION: WFL to testing, but reports heightened sensation - hypersensitive  EDEMA: Atrophy RUE - UPPER ARM, FOREARM  MUSCLE TONE: RUE: Moderate and Hypotonic and LUE: Within functional limits  COGNITION: Overall cognitive status: Within functional limits for tasks assessed.  Reports some concentration deficits which he attributes to pain  VISION: Subjective report: no changes Baseline vision: Wears glasses all the time Visual history:  nothing significant  VISION ASSESSMENT: Not tested  Patient has difficulty with following activities due to following visual impairments: NA  PERCEPTION: WFL  PRAXIS: WFL   TODAY'S TREATMENT:  DATE:   04/28/23:  Reviewed AA/ROM in horizontal abd/add in gravity elim plane along table. Pt adducting with greater ease.   Pt now has full wrist extension against gravity and partial wrist flexion against gravity with increased reps before fatiguing (up to 10 reps)   AA/ROM along inclined surface for shoulder flexion. Followed by AA/ROM in gravity elim plane using UE Ranger for sh flex, horizontal abd, and scaption ranges.   Supine: chest press motion with min assist to hold hand onto bar.  Minimal elbow extension against gravity in isolation  Seated: BUE bicep curls with dowel  NMES for finger flexion (minimal contraction) x 10 min. Previous parameters except increased ramp up to 3 sec. Int = 48    04/26/23 Patient seen for aquatic therapy visit.  Entered and exited independently using stairs and one rail.  Session occurred in 3.6-4.5 feet of water.  Started session with water walking as warm up. Provided soft tissue mobilization to posterior/superior shoulder, followed with scapular mobilization exercises.   Modified plantigrade on pool steps to address shoulder range of  motion and elbow extension.  Provided manual therapy to hand carpal/metacarpal/forearm mobilization and composite digit flex/ext- passive to active assisted.         PATIENT EDUCATION: Education details: see above Person educated: Patient Education method: Explanation, Demonstration, and Verbal cues Education comprehension: verbalized understanding, returned demonstration, and verbal cues required  HOME EXERCISE PROGRAM: 04/14/23: bed positioning, AA/ROM HEP for RUE 04/22/23: passive stretching HEP for Rt hand, edema management strategies   GOALS: Goals reviewed with patient? Yes  SHORT TERM GOALS: Target date: 04/30/23  Patient will complete HEP designed to improve Bilateral shoulder range of motion  Goal status: IN PROGRESS  2.  Patient will complete HEP designed to improve R elbow flex/ext strength  Goal status: IN PROGRESS  3.  Patient will complete HEP designed to improve range of motion for composite flexion and extension in right hand/wrist  Goal status: IN PROGRESS  4.  Patient will demonstrate ability to pick up and retrieve lightweight object from table with RUE - e.g. cell phone  Goal status: INITIAL  5.  Patient will report improved awareness of positional changes/ support techniques to reduce UE pain.    Goal status: IN PROGRESS  6.  Patient will demonstrate 50% composite active flex/ext in right hand  Goal status: IN PROGRESS   LONG TERM GOALS: Target date: 06/28/23  Patient will complete updated HEP designed on UE strengthening without increase in pain  Goal status: INITIAL  2.  Patient will demonstrate 10 lb grip strength R hand  Goal status: INITIAL  3.  Patient will demonstrate low reach RUE to obtain and release a 2 lb object x 5 without dropping  Goal status: INITIAL  4.  Patient will be able to cut his own food with modified independence  Goal status: INITIAL  5.  Patient will be independent with aquatic exercise program addressing BUE  active movement and strength  Goal status: INITIAL  6.  Patient will dress himself with modified independence  Goal status: INITIAL  ASSESSMENT:  CLINICAL IMPRESSION: Pt continues to make slow but steady progress in AA/ROM - noted less compensation in horizontal adduction. Pt also with greater wrist ext against gravity  PERFORMANCE DEFICITS: in functional skills including dexterity, proprioception, sensation, tone, ROM, strength, pain, muscle spasms, Fine motor control, Gross motor control, endurance, and UE functional use  IMPAIRMENTS: are limiting patient from ADLs, IADLs, rest and sleep, work, and leisure.  CO-MORBIDITIES: may have co-morbidities  that affects occupational performance. Patient will benefit from skilled OT to address above impairments and improve overall function.  MODIFICATION OR ASSISTANCE TO COMPLETE EVALUATION: Min-Moderate modification of tasks or assist with assess necessary to complete an evaluation.  OT OCCUPATIONAL PROFILE AND HISTORY: Detailed assessment: Review of records and additional review of physical, cognitive, psychosocial history related to current functional performance.  CLINICAL DECISION MAKING: Moderate - several treatment options, min-mod task modification necessary  REHAB POTENTIAL: Good  EVALUATION COMPLEXITY: Moderate    PLAN:  OT FREQUENCY: 2x/week  OT DURATION: 12 weeks  PLANNED INTERVENTIONS: self care/ADL training, therapeutic exercise, therapeutic activity, neuromuscular re-education, manual therapy, passive range of motion, functional mobility training, aquatic therapy, splinting, electrical stimulation, ultrasound, paraffin, fluidotherapy, moist heat, cryotherapy, contrast bath, patient/family education, and DME and/or AE instructions  RECOMMENDED OTHER SERVICES: may have potential for counseling - discussed with patient - he feels he is ok now, but will keep it in mind  CONSULTED AND AGREED WITH PLAN OF CARE:  Patient  PLAN FOR NEXT SESSION: Aquatic therapy to address gentle motion / gentle resistance with less influence of gravity.   Clinic - pt to bring in estim unit to practice using correct settings (possible TENS for shoulder pain, NMES for finger flexion), continue P/ROM, AA/ROM, and A/ROM as tolerated RUE.   Sheran Lawless, OT 04/28/2023, 8:43 AM

## 2023-04-29 ENCOUNTER — Encounter: Payer: BC Managed Care – PPO | Admitting: Occupational Therapy

## 2023-05-03 ENCOUNTER — Ambulatory Visit: Payer: BC Managed Care – PPO | Admitting: Occupational Therapy

## 2023-05-03 ENCOUNTER — Encounter: Payer: Self-pay | Admitting: Occupational Therapy

## 2023-05-03 DIAGNOSIS — R278 Other lack of coordination: Secondary | ICD-10-CM

## 2023-05-03 DIAGNOSIS — M25641 Stiffness of right hand, not elsewhere classified: Secondary | ICD-10-CM | POA: Diagnosis not present

## 2023-05-03 DIAGNOSIS — M79602 Pain in left arm: Secondary | ICD-10-CM

## 2023-05-03 DIAGNOSIS — M79601 Pain in right arm: Secondary | ICD-10-CM | POA: Diagnosis not present

## 2023-05-03 NOTE — Therapy (Signed)
OUTPATIENT OCCUPATIONAL THERAPY NEURO TREATMENT  Patient Name: Brian [name] MRN: [id number] DOB:10-31-1960, 62 y.o., male Today's Date: 05/03/2023  PCP: Keturah Barre REFERRING PROVIDER: Jacquelyne Balint  END OF SESSION:  OT End of Session - 05/03/23 1905     Visit Number 8    Number of Visits 25    Date for OT Re-Evaluation 06/28/23    Authorization Type [employer]  $1600 deductible has been met  20% coinsurance applies  $8500 oop, zero applied  VL combined with pt, ot, st: 60    OT Start Time 1545    OT Stop Time 1620    OT Time Calculation (min) 35 min    Equipment Utilized During Treatment floatation equipment    Activity Tolerance Patient tolerated treatment well    Behavior During Therapy WFL for tasks assessed/performed             Past Medical History:  Diagnosis Date   COPD (chronic obstructive pulmonary disease) (HCC)    Diabetes mellitus without complication (HCC)    Hypertension    nscl ca dx'd 08/2018   Pneumonia    Subarachnoid hemorrhage (HCC)    Past Surgical History:  Procedure Laterality Date   INGUINAL HERNIA REPAIR Right 06/24/2020   Procedure: RIGHT OPEN INGUINAL HERNIA REPAIR WITH MESH;  Surgeon: Kinsinger, De Blanch, MD;  Location: [facility];  Service: General;  Laterality: Right;   IR IVC FILTER PLMT / S&I Lenise Arena GUID/MOD SED  03/04/2020   IR IVC FILTER RETRIEVAL / S&I Lenise Arena GUID/MOD SED  08/15/2020   IR RADIOLOGIST EVAL & MGMT  08/06/2020   LEG SURGERY  age 43    left leg, from fracture   Patient Active Problem List   Diagnosis Date Noted   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   Recurrent right pleural effusion 07/23/2021   Hypothyroidism 10/24/2020   Acute hypoxemic respiratory failure (HCC) 05/19/2020   Acute respiratory failure with hypoxia (HCC) 05/18/2020   Essential hypertension 05/18/2020   Type 2 diabetes mellitus with hyperlipidemia (HCC) 05/18/2020   S/P thoracentesis 03/21/2020   Malignant neoplasm metastatic to brain (HCC)  03/05/2020   Calf swelling 02/21/2020   Rash 02/07/2020   Adenocarcinoma of right lung, stage 4 (HCC) 02/07/2020   Chronic cough 12/20/2019   Venous thrombosis 09/21/2019   Shortness of breath 09/07/2019   Headache 08/09/2019   Lobar pneumonia, unspecified organism (HCC) 03/07/2019   Centrilobular emphysema (HCC) 03/07/2019   Encounter for antineoplastic immunotherapy 11/10/2018   Adenocarcinoma of right lung, stage 3 (HCC) 08/25/2018   Goals of care, counseling/discussion 08/25/2018   Encounter for antineoplastic chemotherapy 08/25/2018   Mass of upper lobe of right lung 08/04/2018   Subarachnoid hemorrhage (HCC) 05/22/2015    ONSET DATE: 03/17/23 Referral  REFERRING DIAG: UE Pain and paresthesias  THERAPY DIAG:  Pain in right arm  Pain in left arm  Stiffness of right hand, not elsewhere classified  Other lack of coordination  Rationale for Evaluation and Treatment: Rehabilitation  SUBJECTIVE:   SUBJECTIVE STATEMENT:  Sleep is a little better but last night wasn't good Pt accompanied by: self  PERTINENT HISTORY: Lung cancer w/ radiation, SA[facility] 2016. Pt reports developing symptoms RUE this February and worsening symptoms in Rt hand more recently - MD thinks this is due to radiation approx 4 years ago  PRECAUTIONS: None  WEIGHT BEARING RESTRICTIONS: No  PAIN:  Are you having pain? Yes: NPRS scale: 8/10 Pain location: right arm - mostly hand but shoulder as well Pain description: sharp,  stabbing, throbbing, aching Aggravating factors: idle - positional Relieving factors: heat, ice, gabapentin  FALLS: Has patient fallen in last 6 months? Yes. Number of falls 1- fell last night  LIVING ENVIRONMENT: Lives with: lives with their family and lives with their spouse Lives in: House/apartment Stairs: Yes: External: 2 steps; none Has following equipment at home:  has built in shower seat  PLOF: Independent with basic ADLs  PATIENT GOALS: to be able to get back to some  level of use of my right hand  OBJECTIVE:   HAND DOMINANCE: Right  ADLs: Overall ADLs: min assist and increased time Transfers/ambulation related to ADLs: Eating: min assist - wife assists with cutting.  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 driving - uses LUE only at this point Handwriting: unable   POSTURE COMMENTS:  forward head and posterior pelvic tilt   ACTIVITY TOLERANCE: Activity tolerance: reports more fatigue due to constant pain   UPPER EXTREMITY ROM:    Active ROM Right eval Left eval  Shoulder flexion 115 unable to maintain ext elbow 140  Shoulder abduction    Shoulder adduction    Shoulder extension    Shoulder internal rotation    Shoulder external rotation    Elbow flexion WFL   Elbow extension WFL   Wrist flexion    Wrist extension 30   Wrist ulnar deviation 10   Wrist radial deviation 5   Wrist pronation    Wrist supination    (Blank rows = not tested)  UPPER EXTREMITY MMT:     MMT Right eval Left eval  Shoulder flexion NT 4-/5  Shoulder abduction    Shoulder adduction    Shoulder extension    Shoulder internal rotation    Shoulder external rotation    Middle trapezius    Lower trapezius    Elbow flexion    Elbow extension    Wrist flexion 3   Wrist extension 3   Wrist ulnar deviation    Wrist radial deviation    Wrist pronation    Wrist supination    (Blank rows = not tested) RIGHT HAND - 15% COMPOSITE FLEXION     - 40% COMPOSITE EXTENSION HAND FUNCTION: Grip strength: Right: UNABLE lbs; Left: 31.9 lbs  COORDINATION: Finger Nose Finger test: Mild undershooting Left -  right lacks strength/ proximal control to complete  SENSATION: WFL to testing, but reports heightened sensation - hypersensitive  EDEMA: Atrophy RUE - UPPER ARM, FOREARM  MUSCLE TONE: RUE: Moderate and Hypotonic and LUE: Within functional limits  COGNITION: Overall cognitive status: Within functional limits for tasks assessed.  Reports some concentration deficits which he attributes to pain  VISION: Subjective report: no changes Baseline vision: Wears glasses all the time Visual history:  nothing significant  VISION ASSESSMENT: Not tested  Patient has difficulty with following activities due to following visual impairments: NA  PERCEPTION: WFL  PRAXIS: WFL   TODAY'S TREATMENT:  DATE:  05/03/23 Patient seen for aquatic therapy visit.  Entered and exited independently using stairs and one rail.  Session occurred in 3.6-4.0 feet of water.  Patient indicating he felt pain after last session.  Patient feels aquatic therapy is beneficial, but may need to reduce intensity.  Thanked patient for feedback.  Provided soft tissue mobilization to posterior/superior / inferior shoulder, followed with scapular mobilization exercises, with emphasis on upright posture.   Worked in floating supine for scapular stabilization and whole body movements to lengthen right trunk and to improve overall trunk/ shoulder/ neck interaction.  Patient able to swim back stroke with floatation equipment.  Patient reports he did come to pool and used hot tub yesterday with good results!      04/28/23:  Reviewed AA/ROM in horizontal abd/add in gravity elim plane along table. Pt adducting with greater ease.   Pt now has full wrist extension against gravity and partial wrist flexion against gravity with increased reps before fatiguing (up to 10 reps)   AA/ROM along inclined surface for shoulder  flexion. Followed by AA/ROM in gravity elim plane using UE Ranger for sh flex, horizontal abd, and scaption ranges.   Supine: chest press motion with min assist to hold hand onto bar.  Minimal elbow extension against gravity in isolation  Seated: BUE bicep curls with dowel  NMES for finger flexion (minimal contraction) x 10 min. Previous parameters except increased ramp up to 3 sec. Int = 48      PATIENT EDUCATION: Education details: see above Person educated: Patient Education method: Explanation, Demonstration, and Verbal cues Education comprehension: verbalized understanding, returned demonstration, and verbal cues required  HOME EXERCISE PROGRAM: 04/14/23: bed positioning, AA/ROM HEP for RUE 04/22/23: passive stretching HEP for Rt hand, edema management strategies   GOALS: Goals reviewed with patient? Yes  SHORT TERM GOALS: Target date: 04/30/23  Patient will complete HEP designed to improve Bilateral shoulder range of motion  Goal status: IN PROGRESS  2.  Patient will complete HEP designed to improve R elbow flex/ext strength  Goal status: IN PROGRESS  3.  Patient will complete HEP designed to improve range of motion for composite flexion and extension in right hand/wrist  Goal status: IN PROGRESS  4.  Patient will demonstrate ability to pick up and retrieve lightweight object from table with RUE - e.g. cell phone  Goal status: INITIAL  5.  Patient will report improved awareness of positional changes/ support techniques to reduce UE pain.    Goal status: IN PROGRESS  6.  Patient will demonstrate 50% composite active flex/ext in right hand  Goal status: IN PROGRESS   LONG TERM GOALS: Target date: 06/28/23  Patient will complete updated HEP designed on UE strengthening without increase in pain  Goal status: INITIAL  2.  Patient will demonstrate 10 lb grip strength R hand  Goal status: INITIAL  3.  Patient will demonstrate low reach RUE to obtain and  release a 2 lb object x 5 without dropping  Goal status: INITIAL  4.  Patient will be able to cut his own food with modified independence  Goal status: INITIAL  5.  Patient will be independent with aquatic exercise program addressing BUE active movement and strength  Goal status: INITIAL  6.  Patient will dress himself with modified independence  Goal status: INITIAL  ASSESSMENT:  CLINICAL IMPRESSION: Pt continues to make slow but steady progress in AA/ROM - noted less compensation in horizontal adduction. Pt also with greater wrist ext  against gravity  PERFORMANCE DEFICITS: in functional skills including dexterity, proprioception, sensation, tone, ROM, strength, pain, muscle spasms, Fine motor control, Gross motor control, endurance, and UE functional use  IMPAIRMENTS: are limiting patient from ADLs, IADLs, rest and sleep, work, and leisure.   CO-MORBIDITIES: may have co-morbidities  that affects occupational performance. Patient will benefit from skilled OT to address above impairments and improve overall function.  MODIFICATION OR ASSISTANCE TO COMPLETE EVALUATION: Min-Moderate modification of tasks or assist with assess necessary to complete an evaluation.  OT OCCUPATIONAL PROFILE AND HISTORY: Detailed assessment: Review of records and additional review of physical, cognitive, psychosocial history related to current functional performance.  CLINICAL DECISION MAKING: Moderate - several treatment options, min-mod task modification necessary  REHAB POTENTIAL: Good  EVALUATION COMPLEXITY: Moderate    PLAN:  OT FREQUENCY: 2x/week  OT DURATION: 12 weeks  PLANNED INTERVENTIONS: self care/ADL training, therapeutic exercise, therapeutic activity, neuromuscular re-education, manual therapy, passive range of motion, functional mobility training, aquatic therapy, splinting, electrical stimulation, ultrasound, paraffin, fluidotherapy, moist heat, cryotherapy, contrast bath,  patient/family education, and DME and/or AE instructions  RECOMMENDED OTHER SERVICES: may have potential for counseling - discussed with patient - he feels he is ok now, but will keep it in mind  CONSULTED AND AGREED WITH PLAN OF CARE: Patient  PLAN FOR NEXT SESSION: Aquatic therapy to address gentle motion / gentle resistance with less influence of gravity.   Clinic - pt to bring in estim unit to practice using correct settings (possible TENS for shoulder pain, NMES for finger flexion), continue P/ROM, AA/ROM, and A/ROM as tolerated RUE.   [name] Salina, OT 05/03/2023, 7:07 PM

## 2023-05-04 ENCOUNTER — Ambulatory Visit
Payer: BC Managed Care – PPO | Attending: Student in an Organized Health Care Education/Training Program | Admitting: Student in an Organized Health Care Education/Training Program

## 2023-05-04 ENCOUNTER — Encounter: Payer: Self-pay | Admitting: Internal Medicine

## 2023-05-04 ENCOUNTER — Encounter: Payer: Self-pay | Admitting: Student in an Organized Health Care Education/Training Program

## 2023-05-04 VITALS — BP 115/79 | HR 81 | Temp 98.4°F | Resp 16 | Ht 68.0 in | Wt 175.0 lb

## 2023-05-04 DIAGNOSIS — G90511 Complex regional pain syndrome I of right upper limb: Secondary | ICD-10-CM | POA: Diagnosis not present

## 2023-05-04 DIAGNOSIS — M79601 Pain in right arm: Secondary | ICD-10-CM | POA: Insufficient documentation

## 2023-05-04 DIAGNOSIS — M792 Neuralgia and neuritis, unspecified: Secondary | ICD-10-CM | POA: Insufficient documentation

## 2023-05-04 DIAGNOSIS — G894 Chronic pain syndrome: Secondary | ICD-10-CM | POA: Diagnosis not present

## 2023-05-04 DIAGNOSIS — G540 Brachial plexus disorders: Secondary | ICD-10-CM | POA: Diagnosis not present

## 2023-05-04 MED ORDER — AMITRIPTYLINE HCL 25 MG PO TABS
ORAL_TABLET | ORAL | 0 refills | Status: DC
Start: 2023-05-04 — End: 2023-06-09

## 2023-05-04 NOTE — Patient Instructions (Signed)
 ______________________________________________________________________    Preparing for your procedure  Appointments: If you think you may not be able to keep your appointment, call 24-48 hours in advance to cancel. We need time to make it available to others.  During your procedure appointment there will be: No Prescription Refills. No disability issues to discussed. No medication changes or discussions.  Instructions: Food intake: Avoid eating anything solid for at least 8 hours prior to your procedure. Clear liquid intake: You may take clear liquids such as water up to 2 hours prior to your procedure. (No carbonated drinks. No soda.) Transportation: Unless otherwise stated by your physician, bring a driver. (Driver cannot be a Market researcher, Pharmacist, community, or any other form of public transportation.) Morning Medicines: Except for blood thinners, take all of your other morning medications with a sip of water. Make sure to take your heart and blood pressure medicines. If your blood pressure's lower number is above 100, the case will be rescheduled. Blood thinners: Make sure to stop your blood thinners as instructed.  If you take a blood thinner, but were not instructed to stop it, call our office [phone] and ask to talk to a nurse. Not stopping a blood thinner prior to certain procedures could lead to serious complications. Diabetics on insulin: Notify the staff so that you can be scheduled 1st case in the morning. If your diabetes requires high dose insulin, take only  of your normal insulin dose the morning of the procedure and notify the staff that you have done so. Preventing infections: Shower with an antibacterial soap the morning of your procedure.  Build-up your immune system: Take 1000 mg of Vitamin C with every meal (3 times a day) the day prior to your procedure. Antibiotics: Inform the nursing staff if you are taking any antibiotics or if you have any conditions that may require antibiotics  prior to procedures. (Example: recent joint implants)   Pregnancy: If you are pregnant make sure to notify the nursing staff. Not doing so may result in injury to the fetus, including death.  Sickness: If you have a cold, fever, or any active infections, call and cancel or reschedule your procedure. Receiving steroids while having an infection may result in complications. Arrival: You must be in the facility at least 30 minutes prior to your scheduled procedure. Tardiness: Your scheduled time is also the cutoff time. If you do not arrive at least 15 minutes prior to your procedure, you will be rescheduled.  Children: Do not bring any children with you. Make arrangements to keep them home. Dress appropriately: There is always a possibility that your clothing may get soiled. Avoid long dresses. Valuables: Do not bring any jewelry or valuables.  Reasons to call and reschedule or cancel your procedure: (Following these recommendations will minimize the risk of a serious complication.) Surgeries: Avoid having procedures within 2 weeks of any surgery. (Avoid for 2 weeks before or after any surgery). Flu Shots: Avoid having procedures within 2 weeks of a flu shots or . (Avoid for 2 weeks before or after immunizations). Barium: Avoid having a procedure within 7-10 days after having had a radiological study involving the use of radiological contrast. (Myelograms, Barium swallow or enema study). Heart attacks: Avoid any elective procedures or surgeries for the initial 6 months after a "Myocardial Infarction" (Heart Attack). Blood thinners: It is imperative that you stop these medications before procedures. Let us know if you if you take any blood thinner.  Infection: Avoid procedures during or within  two weeks of an infection (including chest colds or gastrointestinal problems). Symptoms associated with infections include: Localized redness, fever, chills, night sweats or profuse sweating, burning sensation  when voiding, cough, congestion, stuffiness, runny nose, sore throat, diarrhea, nausea, vomiting, cold or Flu symptoms, recent or current infections. It is specially important if the infection is over the area that we intend to treat. Heart and lung problems: Symptoms that may suggest an active cardiopulmonary problem include: cough, chest pain, breathing difficulties or shortness of breath, dizziness, ankle swelling, uncontrolled high or unusually low blood pressure, and/or palpitations. If you are experiencing any of these symptoms, cancel your procedure and contact your primary care physician for an evaluation.  Remember:  Regular Business hours are:  Monday to Thursday 8:00 AM to 4:00 PM  Provider's Schedule: Delano Metz, MD:  Procedure days: Tuesday and Thursday 7:30 AM to 4:00 PM  Edward Jolly, MD:  Procedure days: Monday and Wednesday 7:30 AM to 4:00 PM Last  Updated: 03/23/2023 ______________________________________________________________________

## 2023-05-04 NOTE — Progress Notes (Signed)
Safety precautions to be maintained throughout the outpatient stay will include: orient to surroundings, keep bed in low position, maintain call bell within reach at all times, provide assistance with transfer out of bed and ambulation.  

## 2023-05-04 NOTE — Progress Notes (Signed)
Patient: Brian [name]  Service Category: E/M  Provider: Edward Jolly, MD  DOB: [birth year]  DOS: 05/04/2023  Referring Provider: Lovenia Kim, MD  MRN: [id number]  Setting: Ambulatory outpatient  PCP: [name], MD  Type: New Patient  Specialty: Interventional Pain Management    Location: Office  Delivery: Face-to-face     Primary Reason(s) for Visit: Encounter for initial evaluation of one or more chronic problems (new to examiner) potentially causing chronic pain, and posing a threat to normal musculoskeletal function. (Level of risk: High) CC: Arm Pain (R>L) and Shoulder Pain (R>L)  HPI  [name] is a 62 y.o. year old, male patient, who comes for the first time to our practice referred by Lovenia Kim, MD for our initial evaluation of his chronic pain. He has Subarachnoid hemorrhage (HCC); Mass of upper lobe of right lung; Adenocarcinoma of right lung, stage 3 (HCC); Goals of care, counseling/discussion; Encounter for antineoplastic chemotherapy; Encounter for antineoplastic immunotherapy; Lobar pneumonia, unspecified organism (HCC); Centrilobular emphysema (HCC); Headache; Shortness of breath; Venous thrombosis; Chronic cough; Rash; Adenocarcinoma of right lung, stage 4 (HCC); Calf swelling; Malignant neoplasm metastatic to brain [facility]); S/P thoracentesis; Acute respiratory failure with hypoxia (HCC); Essential hypertension; Type 2 diabetes mellitus with hyperlipidemia (HCC); Acute hypoxemic respiratory failure (HCC); Hypothyroidism; Recurrent right pleural effusion; Radiation-induced brachial plexopathy; Complex regional pain syndrome type 1 of right upper extremity; and Right arm pain on their problem list. Today he comes in for evaluation of his Arm Pain (R>L) and Shoulder Pain (R>L)  Pain Assessment: Location: Right, Left Shoulder Radiating:   Onset: More than a month ago Duration: Chronic pain Quality:   Severity: 5 /10 (subjective, self-reported pain score)  Effect on  ADL: Stops me from having a god night sleep and now have to shift ativities to left arm now and limtits ADLs. Timing: Constant Modifying factors: Ice pack, heat, pool at the gym BP: 115/79  HR: 81  Onset and Duration: Sudden, Date of onset: February 2024, and Present longer than 3 months Cause of pain:  Radiation Induced  Severity: Getting worse, NAS-11 at its worse: 9/10, NAS-11 at its best: 3/10, NAS-11 now: 4/10, and NAS-11 on the average: 5/10 Timing: Afternoon and Evening Aggravating Factors: Motion Alleviating Factors: Cold packs, Hot packs, Medications, and Warm showers or baths Associated Problems: Constipation, Fatigue, Numbness, Sadness, Swelling, Tingling, Weakness, and Pain that wakes patient up Quality of Pain: Aching, Agonizing, Burning, Constant, Disabling, Distressing, Exhausting, Sharp, Shooting, Throbbing, and Tingling Previous Examinations or Tests: CT scan, MRI scan, and Nerve conduction test Previous Treatments: Physical Therapy and Pool exercises  [name] is being evaluated for possible interventional pain management therapies for the treatment of his chronic pain.   Patient is a pleasant 62 year old male who presents with a chief complaint of right arm pain, numbness and weakness.  Of note he has a history of radiation induced brachial plexopathy.  Patient underwent radiation therapy for adenocarcinoma in 2020.  He states that his current symptoms started in January 2024 and got worse over 4 to 5 months.  He states that the last 6 to 8 weeks have been really bad.  He is here today with his wife.  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  He has swelling of his right forearm and hand, sensitivity to touch, changes in temperature in that area and changes in color as well.  He has done physical therapy in the past which was helpful.  He is currently working with  occupational therapy and aquatic therapy.  He is taking gabapentin 2400 mg nightly as well as baclofen as needed.  He  also has left shoulder and arm pain but not as bad as his right.  He is being referred from Dr. Katrinka Blazing to consider a scalene block and possible nerve stimulation.  Meds   Current Outpatient Medications:    albuterol (VENTOLIN HFA) 108 (90 Base) MCG/ACT inhaler, INHALE 2 PUFFS INTO THE LUNGS EVERY 4 (FOUR) HOURS AS NEEDED FOR WHEEZING OR SHORTNESS OF BREATH., Disp: 6.7 each, Rfl: 4   [name] 150 MG capsule, TAKE 4 CAPSULES BY MOUTH TWICE DAILY WITH FOOD. SWALLOW CAPSULES WHOLE. DO NOT OPEN OR CRUSH. STORE IN ORIGINAL CONTAINER, Disp: 240 capsule, Rfl: 2   amitriptyline (ELAVIL) 25 MG tablet, Take 1 tablet (25 mg total) by mouth at bedtime for 15 days, THEN 2 tablets (50 mg total) at bedtime., Disp: 105 tablet, Rfl: 0   amLODipine (NORVASC) 2.5 MG tablet, Take 2.5 mg by mouth daily., Disp: , Rfl:    atorvastatin (LIPITOR) 20 MG tablet, Take 20 mg by mouth daily., Disp: , Rfl:    baclofen (LIORESAL) 10 MG tablet, Take 10 mg by mouth daily., Disp: , Rfl:    DULoxetine (CYMBALTA) 60 MG capsule, Take 1 capsule (60 mg total) by mouth daily., Disp: 90 capsule, Rfl: 3   gabapentin (NEURONTIN) 600 MG tablet, Take 1 tablet (600 mg) midday and take 4 tablets (2400 mg) at bedtime, Disp: 120 tablet, Rfl: 5   naproxen sodium (ALEVE) 220 MG tablet, Take 220 mg by mouth., Disp: , Rfl:    TRELEGY ELLIPTA 100-62.5-25 MCG/INH AEPB, Inhale 1 puff into the lungs daily., Disp: , Rfl:   Imaging Review  Cervical Imaging: Cervical MR wo contrast: Results for orders placed during the hospital encounter of 07/30/22  MR CERVICAL SPINE WO CONTRAST  Narrative CLINICAL DATA:  Right upper extremity paresthesias; history of radiation therapy for lung cancer  EXAM: MRI CERVICAL SPINE WITHOUT CONTRAST  TECHNIQUE: Multiplanar, multisequence MR imaging of the cervical spine was performed. No intravenous contrast was administered.  COMPARISON:  None Available.  FINDINGS: Alignment: Physiologic.  Vertebrae: No  fracture, evidence of discitis, or bone lesion.  Cord: Normal signal and morphology.  Posterior Fossa, vertebral arteries, paraspinal tissues: Negative.  Disc levels:  C2-C3: No significant disc bulge. Mild facet arthropathy. No spinal canal stenosis or neural foraminal narrowing.  C3-C4: No significant disc bulge. Left-greater-than-right facet arthropathy. No spinal canal stenosis or neural foraminal narrowing.  C4-C5: No significant disc bulge. Left-greater-than-right facet arthropathy. No spinal canal stenosis or neural foraminal narrowing.  C5-C6: Minimal disc bulge with right paracentral and subarticular disc protrusion, which indents the thecal sac but does not deform the spinal cord. Facet and uncovertebral hypertrophy. No spinal canal stenosis. Mild right neural foraminal narrowing.  C6-C7: No significant disc bulge. No spinal canal stenosis or neuroforaminal narrowing.  C7-T1: No significant disc bulge. No spinal canal stenosis or neuroforaminal narrowing.  IMPRESSION: 1. C5-C6 mild right neural foraminal narrowing. 2. Multilevel facet arthropathy, which can be a cause of pain.   Electronically Signed By: Wiliam Ke M.D. On: 08/01/2022 19:19   EMG (03/30/23): NCV & EMG Findings: Extensive electrodiagnostic evaluation of bilateral upper limbs shows: Bilateral median antebrachial cutaneous (MAC) and right lateral antebrachial cutaneous (LAC) sensory responses are absent. Bilateral median, bilateral ulnar, and bilateral radial sensory responses show reduced amplitudes (see table below). Left LAC is present. Bilateral median (APB) motor responses show reduced amplitudes (right  1.17, left 4.0 mV). Right ulnar (ADM) motor response shows reduced amplitude (4.0 mV). Left ulnar (ADM) motor response is within normal limits, but borderline normal (7.3 mV). Chronic motor axon loss changes WITH accompanying active denervation changes are seen in the bilateral flexor pollicis  longus, right first dorsal interosseous, right extensor inidicis proprius, abductor pollicis brevis, and triceps muscles. Chronic motor axon loss changes WITHOUT active denervation changes are seen in right biceps, right deltoid, right infraspinatus, left first dorsal interosseous, left extensor indicis proprius, and left abductor pollicis brevis muscles.  Myokymic discharges are seen in the right flexor pollicis longus, right biceps, and right infraspinatus muscles.   Impression: This is an abnormal study. 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 There are myokymic discharges in 3 muscles of the right upper limb, as may be seen in radiation plexopathy. 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 An overlapping left C8-T1 radiculopathy cannot be completely excluded, though normal cervical paraspinal evaluation makes #2 more likely.  Complexity Note: Imaging results reviewed.                         ROS  Cardiovascular: High blood pressure Pulmonary or Respiratory: Difficulty blowing air out (Emphysema), Smoking, and Snoring  Neurological: Stroke Psychological-Psychiatric: No reported psychological or psychiatric signs or symptoms such as difficulty sleeping, anxiety, depression, delusions or hallucinations (schizophrenial), mood swings (bipolar disorders) or suicidal ideations or attempts Gastrointestinal: No reported gastrointestinal signs or symptoms such as vomiting or evacuating blood, reflux, heartburn, alternating episodes of diarrhea and constipation, inflamed or scarred liver, or pancreas or irrregular and/or infrequent bowel movements Genitourinary: No reported renal or genitourinary signs or symptoms such as difficulty voiding or producing urine, peeing blood, non-functioning kidney, kidney stones,  difficulty emptying the bladder, difficulty controlling the flow of urine, or chronic kidney disease Hematological: No reported hematological signs or symptoms such as prolonged bleeding, low or poor functioning platelets, bruising or bleeding easily, hereditary bleeding problems, low energy levels due to low hemoglobin or being anemic Endocrine: Slow thyroid Rheumatologic: No reported rheumatological signs and symptoms such as fatigue, joint pain, tenderness, swelling, redness, heat, stiffness, decreased range of motion, with or without associated rash Musculoskeletal: Negative for myasthenia gravis, muscular dystrophy, multiple sclerosis or malignant hyperthermia Work History: Working full time  Allergies  Mr. Salerno has No Known Allergies.  Laboratory Chemistry Profile   Renal Lab Results  Component Value Date   BUN 21 (H) 11/06/2022   CREATININE 1.03 11/06/2022   GFRAA >60 05/03/2020   GFRNONAA >60 11/06/2022   PROTEINUR NEGATIVE 10/06/2015     Electrolytes Lab Results  Component Value Date   NA 137 11/06/2022   K 4.2 11/06/2022   CL 104 11/06/2022   CALCIUM 9.0 11/06/2022   MG 2.3 05/20/2020     Hepatic Lab Results  Component Value Date   AST 31 11/06/2022   ALT 43 11/06/2022   ALBUMIN 4.2 11/06/2022   ALKPHOS 100 11/06/2022   LIPASE 25 10/06/2015     ID Lab Results  Component Value Date   HIV Non Reactive 05/18/2020   SARSCOV2NAA NEGATIVE 11/13/2020   MRSAPCR NEGATIVE 05/22/2015     Bone No results found for: "VD25OH", "VD125OH2TOT", "ZO1096EA5", "WU9811BJ4", "25OHVITD1", "25OHVITD2", "25OHVITD3", "TESTOFREE", "TESTOSTERONE"   Endocrine Lab Results  Component Value Date   GLUCOSE 107 (  H) 11/06/2022   GLUCOSEU NEGATIVE 10/06/2015   HGBA1C 4.8 05/19/2020   TSH 6.447 (H) 11/06/2022     Neuropathy Lab Results  Component Value Date   VITAMINB12 872 01/28/2022   FOLATE 21.7 01/28/2022   HGBA1C 4.8 05/19/2020   HIV Non Reactive 05/18/2020     CNS No  results found for: "COLORCSF", "APPEARCSF", "RBCCOUNTCSF", "WBCCSF", "POLYSCSF", "LYMPHSCSF", "EOSCSF", "PROTEINCSF", "GLUCCSF", "JCVIRUS", "CSFOLI", "IGGCSF", "LABACHR", "ACETBL"   Inflammation (CRP: Acute  ESR: Chronic) No results found for: "CRP", "ESRSEDRATE", "LATICACIDVEN"   Rheumatology No results found for: "RF", "ANA", "LABURIC", "URICUR", "LYMEIGGIGMAB", "LYMEABIGMQN", "HLAB27"   Coagulation Lab Results  Component Value Date   INR 1.1 11/26/2019   LABPROT 14.2 11/26/2019   APTT 28 05/21/2015   PLT 273 11/06/2022     Cardiovascular Lab Results  Component Value Date   BNP 41.3 11/26/2019   CKTOTAL 149 11/20/2020   HGB 12.6 (L) 11/06/2022   HCT 37.2 (L) 11/06/2022     Screening Lab Results  Component Value Date   SARSCOV2NAA NEGATIVE 11/13/2020   MRSAPCR NEGATIVE 05/22/2015   HIV Non Reactive 05/18/2020     Cancer No results found for: "CEA", "CA125", "LABCA2"   Allergens No results found for: "ALMOND", "APPLE", "ASPARAGUS", "AVOCADO", "BANANA", "BARLEY", "BASIL", "BAYLEAF", "GREENBEAN", "LIMABEAN", "WHITEBEAN", "BEEFIGE", "REDBEET", "BLUEBERRY", "BROCCOLI", "CABBAGE", "MELON", "CARROT", "CASEIN", "CASHEWNUT", "CAULIFLOWER", "CELERY"     Note: Lab results reviewed.  PFSH  Drug: Mr. Kovacevich  reports no history of drug use. Alcohol:  reports current alcohol use of about 5.0 standard drinks of alcohol per week. Tobacco:  reports that he has never smoked. He has never used smokeless tobacco. Medical:  has a past medical history of COPD (chronic obstructive pulmonary disease) (HCC), Diabetes mellitus without complication (HCC), Hypertension, nscl ca (dx'd 08/2018), Pneumonia, and Subarachnoid hemorrhage (HCC). Family: family history includes Diabetes in his brother and mother.  Past Surgical History:  Procedure Laterality Date   INGUINAL HERNIA REPAIR Right 06/24/2020   Procedure: RIGHT OPEN INGUINAL HERNIA REPAIR WITH MESH;  Surgeon: Kinsinger, De Blanch, MD;   Location: [facility];  Service: General;  Laterality: Right;   IR IVC FILTER PLMT / S&I Lenise Arena GUID/MOD SED  03/04/2020   IR IVC FILTER RETRIEVAL / S&I Lenise Arena GUID/MOD SED  08/15/2020   IR RADIOLOGIST EVAL & MGMT  08/06/2020   LEG SURGERY  age 68    left leg, from fracture   Active Ambulatory Problems    Diagnosis Date Noted   Subarachnoid hemorrhage (HCC) 05/22/2015   Mass of upper lobe of right lung 08/04/2018   Adenocarcinoma of right lung, stage 3 (HCC) 08/25/2018   Goals of care, counseling/discussion 08/25/2018   Encounter for antineoplastic chemotherapy 08/25/2018   Encounter for antineoplastic immunotherapy 11/10/2018   Lobar pneumonia, unspecified organism (HCC) 03/07/2019   Centrilobular emphysema (HCC) 03/07/2019   Headache 08/09/2019   Shortness of breath 09/07/2019   Venous thrombosis 09/21/2019   Chronic cough 12/20/2019   Rash 02/07/2020   Adenocarcinoma of right lung, stage 4 (HCC) 02/07/2020   Calf swelling 02/21/2020   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/05/2020   S/P thoracentesis 03/21/2020   Acute respiratory failure with hypoxia (HCC) 05/18/2020   Essential hypertension 05/18/2020   Type 2 diabetes mellitus with hyperlipidemia (HCC) 05/18/2020   Acute hypoxemic respiratory failure (HCC) 05/19/2020   Hypothyroidism 10/24/2020   Recurrent right pleural effusion 07/23/2021   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   Complex regional pain syndrome type 1 of right upper extremity 05/04/2023  Right arm pain 05/04/2023   Resolved Ambulatory Problems    Diagnosis Date Noted   No Resolved Ambulatory Problems   Past Medical History:  Diagnosis Date   COPD (chronic obstructive pulmonary disease) (HCC)    Diabetes mellitus without complication (HCC)    Hypertension    nscl ca dx'd 08/2018   Pneumonia    Constitutional Exam  General appearance: Well nourished, well developed, and well hydrated. In no apparent acute distress Vitals:   05/04/23 0756   BP: 115/79  Pulse: 81  Resp: 16  Temp: 98.4 F (36.9 C)  TempSrc: Temporal  SpO2: 99%  Weight: 175 lb (79.4 kg)  Height: 5\' 8"  (1.727 m)   BMI Assessment: Estimated body mass index is 26.61 kg/m as calculated from the following:   Height as of this encounter: 5\' 8"  (1.727 m).   Weight as of this encounter: 175 lb (79.4 kg).  BMI interpretation table: BMI level Category Range association with higher incidence of chronic pain  <18 kg/m2 Underweight   18.5-24.9 kg/m2 Ideal body weight   25-29.9 kg/m2 Overweight Increased incidence by 20%  30-34.9 kg/m2 Obese (Class I) Increased incidence by 68%  35-39.9 kg/m2 Severe obesity (Class II) Increased incidence by 136%  >40 kg/m2 Extreme obesity (Class III) Increased incidence by 254%   Patient's current BMI Ideal Body weight  Body mass index is 26.61 kg/m. Ideal body weight: 68.4 kg (150 lb 12.7 oz) Adjusted ideal body weight: 72.8 kg (160 lb 7.6 oz)   BMI Readings from Last 4 Encounters:  05/04/23 26.61 kg/m  04/21/23 28.13 kg/m  04/07/23 27.92 kg/m  03/17/23 27.98 kg/m   Wt Readings from Last 4 Encounters:  05/04/23 175 lb (79.4 kg)  04/21/23 185 lb (83.9 kg)  04/07/23 183 lb 9.6 oz (83.3 kg)  03/17/23 184 lb (83.5 kg)    Psych/Mental status: Alert, oriented x 3 (person, place, & time)       Eyes: PERLA Respiratory: No evidence of acute respiratory distress  Cervical Spine Area Exam  Skin & Axial Inspection: No masses, redness, edema, swelling, or associated skin lesions Alignment: Symmetrical Functional ROM: Unrestricted ROM      Stability: No instability detected Muscle Tone/Strength: Functionally intact. No obvious neuro-muscular anomalies detected. Sensory (Neurological): Neurogenic pain pattern Palpation: No palpable anomalies             Upper Extremity (UE) Exam    Side: Right upper extremity  Side: Left upper extremity  Skin & Extremity Inspection: Edema, color change, allodynia  Skin & Extremity  Inspection: Skin color, temperature, and hair growth are WNL. No peripheral edema or cyanosis. No masses, redness, swelling, asymmetry, or associated skin lesions. No contractures.  Functional ROM: Pain restricted ROM for all joints of upper extremity  Functional ROM: Pain restricted ROM          Muscle Tone/Strength: Movement possible against some resistance (4/5) atrophy noted of the right upper extremity  Muscle Tone/Strength: Functionally intact. No obvious neuro-muscular anomalies detected.  Sensory (Neurological): Neurogenic pain pattern          Sensory (Neurological): Unimpaired          Palpation: Hypothermic              Palpation: No palpable anomalies              Provocative Test(s):  Phalen's test: deferred Tinel's test: deferred Apley's scratch test (touch opposite shoulder):  Action 1 (Across chest): deferred Action 2 (Overhead): deferred Action 3 (  LB reach): deferred   Provocative Test(s):  Phalen's test: deferred Tinel's test: deferred Apley's scratch test (touch opposite shoulder):  Action 1 (Across chest): deferred Action 2 (Overhead): deferred Action 3 (LB reach): deferred     Assessment  Primary Diagnosis & Pertinent Problem List: The primary encounter diagnosis was Radiation-induced brachial plexopathy (RIGHT). 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  Visit Diagnosis (New problems to examiner): 1. Radiation-induced brachial plexopathy (RIGHT)   2. Complex regional pain syndrome type 1 of right upper extremity   3. Right arm pain   4. 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 Chronic pain syndrome    Plan of Care (Initial workup plan)  Concern for radiation-induced brachial plexopathy and patient also has symptoms consistent with complex regional pain syndrome type I.  He meets all of [place] criteria including color  change(shiny hand), temperature change (cool to touch), allodynia, trophic changes, wasting of his hands as well as allodynia and paresthesias.  He is on a high dose of gabapentin 2600 mg nightly.  I advised him to decrease his dose to 1200 mg nightly and adding amitriptyline as below.  He also takes Cymbalta.  We discussed serotonin syndrome as well as anticholinergic side effects that he may experience with amitriptyline.  We discussed a scalene block under ultrasound guidance.  We also discussed stellate ganglion block, peripheral nerve stimulation of the brachial plexus as well as spinal cord stimulation for complex regional pain syndrome.  We will start with diagnostic scalene block first.  I encouraged the patient to continue with aquatic therapy and Occupational Therapy.  Procedure Orders         BRACHIAL PLEXUS BLOCK    Pharmacotherapy (current): Medications ordered:  Meds ordered this encounter  Medications   amitriptyline (ELAVIL) 25 MG tablet    Sig: Take 1 tablet (25 mg total) by mouth at bedtime for 15 days, THEN 2 tablets (50 mg total) at bedtime.    Dispense:  105 tablet    Refill:  0   Medications administered during this visit: Delsa Sale had no medications administered during this visit.     Interventional management options: Mr. Hersi was informed that there is no guarantee that he would be a candidate for interventional therapies. The decision will be based on the results of diagnostic studies, as well as Mr. Pafford risk profile.  Procedure(s) under consideration:  Scalene block for right brachial plexopathy Right stellate ganglion nerve block Peripheral nerve stimulation of the brachial plexus Spinal cord stimulator trial     Provider-requested follow-up: Return in about 8 days (around 05/12/2023) for Right scalene block ([place]) PO Valium (40 mins).  Future Appointments  Date Time Provider Department Center  05/06/2023  8:45 AM Victorino Sparrow, OT [facility]  [facility]  05/13/2023  8:45 AM Victorino Sparrow, OT [facility]  05/14/2023  8:45 AM [facility]/NEURO LAB [facility] None  05/14/2023  9:00 AM Altamese Edgefield, MD [facility] None  05/20/2023  8:00 AM Sheran Lawless, OT [facility] [place]  05/27/2023  8:00 AM Sheran Lawless, OT [facility]  06/03/2023  8:00 AM Sheran Lawless, OT [facility]  06/10/2023  8:00 AM Victorino Sparrow, OT [facility]  06/17/2023  8:00 AM Victorino Sparrow, OT [facility]  06/17/2023  2:00 PM Antony Madura, MD [facility] None  06/24/2023  8:00  AM Sheran Lawless, OT [facility]  07/15/2023  8:00 AM Victorino Sparrow, OT [facility]  10/12/2023  2:00 PM [facility]-MR 1 [facility]-MRI [facility]  10/18/2023  7:00 AM [facility]-TUMOR BOARD CONFERENCE [facility] None  10/18/2023  1:30 PM Ronny Bacon, PA-C [facility] None    Duration of encounter: .  Total time on encounter, as per AMA guidelines included both the face-to-face and non-face-to-face time personally spent by the physician and/or other qualified health care professional(s) on the day of the encounter (includes time in activities that require the physician or other qualified health care professional and does not include time in activities normally performed by clinical staff). Physician's time may include the following activities when performed: Preparing to see the patient (e.g., pre-charting review of records, searching for previously ordered imaging, lab work, and nerve conduction tests) Review of prior analgesic pharmacotherapies. Reviewing PMP Interpreting ordered tests (e.g., lab work, imaging, nerve conduction tests) Performing post-procedure evaluations, including interpretation of diagnostic procedures Obtaining and/or reviewing separately obtained history Performing a medically appropriate examination and/or evaluation Counseling and educating the patient/family/caregiver Ordering medications, tests, or procedures Referring and communicating with other  health care professionals (when not separately reported) Documenting clinical information in the electronic or other health record Independently interpreting results (not separately reported) and communicating results to the patient/ family/caregiver Care coordination (not separately reported)  Note by: Edward Jolly, MD (TTS technology used. I apologize for any typographical errors that were not detected and corrected.) Date: 05/04/2023; Time: 10:06 AM

## 2023-05-06 ENCOUNTER — Ambulatory Visit: Payer: BC Managed Care – PPO | Attending: Neurology | Admitting: Occupational Therapy

## 2023-05-06 DIAGNOSIS — R609 Edema, unspecified: Secondary | ICD-10-CM | POA: Insufficient documentation

## 2023-05-06 DIAGNOSIS — M25641 Stiffness of right hand, not elsewhere classified: Secondary | ICD-10-CM | POA: Insufficient documentation

## 2023-05-06 DIAGNOSIS — R278 Other lack of coordination: Secondary | ICD-10-CM | POA: Insufficient documentation

## 2023-05-06 DIAGNOSIS — M79602 Pain in left arm: Secondary | ICD-10-CM | POA: Insufficient documentation

## 2023-05-06 DIAGNOSIS — M79601 Pain in right arm: Secondary | ICD-10-CM | POA: Insufficient documentation

## 2023-05-06 NOTE — Therapy (Signed)
OUTPATIENT OCCUPATIONAL THERAPY NEURO TREATMENT  Patient Name: Brian [name] MRN: [id number] DOB:Dec 21, 1960, 62 y.o., male Today's Date: 05/06/2023  PCP: Keturah Barre REFERRING PROVIDER: Jacquelyne Balint  END OF SESSION:  OT End of Session - 05/06/23 0849     Visit Number 9    Number of Visits 25    Date for OT Re-Evaluation 06/28/23    Authorization Type [employer]  $1600 deductible has been met  20% coinsurance applies  $8500 oop, zero applied  VL combined with pt, ot, st: 60    OT Start Time 0847    OT Stop Time 0930    OT Time Calculation (min) 43 min    Equipment Utilized During Treatment Personal TENS    Activity Tolerance Patient tolerated treatment well    Behavior During Therapy WFL for tasks assessed/performed             Past Medical History:  Diagnosis Date   COPD (chronic obstructive pulmonary disease) (HCC)    Diabetes mellitus without complication (HCC)    Hypertension    nscl ca dx'd 08/2018   Pneumonia    Subarachnoid hemorrhage (HCC)    Past Surgical History:  Procedure Laterality Date   INGUINAL HERNIA REPAIR Right 06/24/2020   Procedure: RIGHT OPEN INGUINAL HERNIA REPAIR WITH MESH;  Surgeon: Kinsinger, De Blanch, MD;  Location: [facility];  Service: General;  Laterality: Right;   IR IVC FILTER PLMT / S&I Lenise Arena GUID/MOD SED  03/04/2020   IR IVC FILTER RETRIEVAL / S&I Lenise Arena GUID/MOD SED  08/15/2020   IR RADIOLOGIST EVAL & MGMT  08/06/2020   LEG SURGERY  age 51    left leg, from fracture   Patient Active Problem List   Diagnosis Date Noted   Complex regional pain syndrome type 1 of right upper extremity 05/04/2023   Right arm pain 05/04/2023   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   Recurrent right pleural effusion 07/23/2021   Hypothyroidism 10/24/2020   Acute hypoxemic respiratory failure (HCC) 05/19/2020   Acute respiratory failure with hypoxia (HCC) 05/18/2020   Essential hypertension 05/18/2020   Type 2 diabetes mellitus with hyperlipidemia  (HCC) 05/18/2020   S/P thoracentesis 03/21/2020   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/05/2020   Calf swelling 02/21/2020   Rash 02/07/2020   Adenocarcinoma of right lung, stage 4 (HCC) 02/07/2020   Chronic cough 12/20/2019   Venous thrombosis 09/21/2019   Shortness of breath 09/07/2019   Headache 08/09/2019   Lobar pneumonia, unspecified organism (HCC) 03/07/2019   Centrilobular emphysema (HCC) 03/07/2019   Encounter for antineoplastic immunotherapy 11/10/2018   Adenocarcinoma of right lung, stage 3 (HCC) 08/25/2018   Goals of care, counseling/discussion 08/25/2018   Encounter for antineoplastic chemotherapy 08/25/2018   Mass of upper lobe of right lung 08/04/2018   Subarachnoid hemorrhage (HCC) 05/22/2015    ONSET DATE: 03/17/23 Referral  REFERRING DIAG: UE Pain and paresthesias  THERAPY DIAG:  Pain in right arm  Stiffness of right hand, not elsewhere classified  Other lack of coordination  Edema, unspecified type  Rationale for Evaluation and Treatment: Rehabilitation  SUBJECTIVE:   SUBJECTIVE STATEMENT:   Pt reports things having more intense and constant pain an tingling up and down his arm but unable to attribute it to anything. Has slept well the last 2 nights but has medication switched up to help - New med Elavil & tapering off Gabapentin - now at just 2 at night x 15 days and then off.  Next Wednesday pt is having  external sternal nerve stimulator installed and if that goes well, they will do an internal one later.   Pt accompanied by: self  PERTINENT HISTORY: Lung cancer w/ radiation, [facility] 2016. Pt reports developing symptoms RUE this February and worsening symptoms in Rt hand more recently - MD thinks this is due to radiation approx 4 years ago  PRECAUTIONS: None  WEIGHT BEARING RESTRICTIONS: No  PAIN:  Are you having pain? Yes: NPRS scale: 7/10 Pain location: right arm - mostly hand but into shoulder and also reports it is starting to get  tingling on L side as well into his hand Pain description: sharp, stabbing, throbbing, aching Aggravating factors: idle - positional Relieving factors: heat, ice, gabapentin  FALLS: Has patient fallen in last 6 months? Yes. Number of falls 1- fell last night  LIVING ENVIRONMENT: Lives with: lives with their family and lives with their spouse Lives in: House/apartment Stairs: Yes: External: 2 steps; none Has following equipment at home:  has built in shower seat  PLOF: Independent with basic ADLs  PATIENT GOALS: to be able to get back to some level of use of my right hand  OBJECTIVE:   HAND DOMINANCE: Right  ADLs: Overall ADLs: min assist and increased time Transfers/ambulation related to ADLs: Eating: min assist - wife assists with cutting.  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 driving - uses LUE only at this point Handwriting: unable   POSTURE COMMENTS:  forward head and posterior pelvic tilt   ACTIVITY TOLERANCE: Activity tolerance: reports more fatigue due to constant pain   UPPER EXTREMITY ROM:    Active ROM Right eval Left eval  Shoulder flexion 115 unable to maintain ext elbow 140  Shoulder abduction    Shoulder adduction    Shoulder extension    Shoulder internal rotation    Shoulder external rotation    Elbow flexion WFL   Elbow extension WFL   Wrist flexion    Wrist extension 30   Wrist ulnar deviation 10   Wrist radial deviation 5   Wrist pronation    Wrist supination    (Blank rows = not tested)  UPPER EXTREMITY MMT:     MMT Right eval Left eval  Shoulder flexion NT 4-/5  Shoulder  abduction    Shoulder adduction    Shoulder extension    Shoulder internal rotation    Shoulder external rotation    Middle trapezius    Lower trapezius    Elbow flexion    Elbow extension    Wrist flexion 3   Wrist extension 3   Wrist ulnar deviation    Wrist radial deviation    Wrist pronation    Wrist supination    (Blank rows = not tested) RIGHT HAND - 15% COMPOSITE FLEXION     - 40% COMPOSITE EXTENSION HAND FUNCTION: Grip strength: Right: UNABLE lbs; Left: 31.9 lbs  COORDINATION: Finger Nose Finger test: Mild undershooting Left - right lacks strength/ proximal control to complete  SENSATION: WFL to testing, but reports heightened sensation - hypersensitive  EDEMA: Atrophy RUE - UPPER ARM, FOREARM  MUSCLE TONE: RUE: Moderate and Hypotonic and LUE: Within functional limits  COGNITION: Overall cognitive status: Within functional limits  for tasks assessed.  Reports some concentration deficits which he attributes to pain  VISION: Subjective report: no changes Baseline vision: Wears glasses all the time Visual history:  nothing significant  VISION ASSESSMENT: Not tested  Patient has difficulty with following activities due to following visual impairments: NA  PERCEPTION: WFL  PRAXIS: WFL   TODAY'S TREATMENT:                                                                                                                              DATE:     05/06/23:   Manual Techniques: Pt has observable swelling and glassy skin on R hand with pt reporting suggestion of CRPS (complex regional pain syndrome) being mentioned to him based on this observation.  Pt educated in retrograde massage with OT providing demonstration and carryover of task with improved flexibility of finger flexion for AAROM activities to follow.  Estim/TENS OT brought his own estim/TENS unit to use today and it was used to improve nerve function as needed to produce more active RUE wrist/digital flexion.  Therapist educated patient on application for RUE wrist and digital flexion and applied electrodes accordingly. Therapist utilized preset 'hand' setting with patient adjusting the intensity to his tolerance and to elicit some active motion with therapist assisting pt to increase grasp with RUE during NMES treatment.   Therapeutic Activities  Initiated education re sensory stimulation of R UE for interrupting negative sensory messages including use of massage, different textures, different temperatures and even weighted objects etc.  He does use a heat pad and cold pack in bed at times and was provided frequent tactile input to R hand and forearm throughout session to work on grasp with R digits with hand over hand support during estim application.   PATIENT EDUCATION: Education details: Futures trader Person educated: Patient Education method: Explanation, Demonstration, and Verbal cues Education comprehension: verbalized understanding, returned demonstration, and verbal cues required  HOME EXERCISE PROGRAM: 04/14/23: bed positioning, AA/ROM HEP for RUE 04/22/23: passive stretching HEP for Rt hand, edema management strategies   GOALS: Goals reviewed with patient? Yes  SHORT TERM GOALS: Target date: 04/30/23  Patient will complete HEP designed to improve Bilateral shoulder range of motion  Goal status: IN PROGRESS  2.  Patient will complete HEP designed to improve R elbow flex/ext strength  Goal status: IN PROGRESS  3.  Patient will complete HEP designed to improve range of motion for composite flexion and extension in right hand/wrist  Goal status: IN PROGRESS  4.  Patient will demonstrate ability to pick up and retrieve lightweight object from table with RUE - e.g. cell phone  Goal status: IN PROGRESS  5.  Patient will report improved awareness of positional changes/ support techniques to reduce UE pain.    Goal status: IN PROGRESS  6.  Patient will demonstrate 50%  composite active flex/ext in right hand  Goal status: IN PROGRESS   LONG TERM GOALS: Target date: 06/28/23  Patient will complete updated HEP designed on UE strengthening without increase in pain  Goal status: IN PROGRESS  2.  Patient will demonstrate 10 lb grip strength R hand  Goal status: IN PROGRESS  3.  Patient will demonstrate low reach RUE to obtain and release a 2 lb object x 5 without dropping  Goal status: IN PROGRESS  4.  Patient will be able to cut his own food with modified independence  Goal status: IN PROGRESS  5.  Patient will be independent with aquatic exercise program addressing BUE active movement and strength  Goal status: IN PROGRESS  6.  Patient will dress himself with modified independence  Goal status: IN PROGRESS  ASSESSMENT:  CLINICAL IMPRESSION: Patient is seen today for occupational therapy treatment for RUE swelling, weakness, pain and sensory changes. Patient currently presents well below baseline level of function with his dominant RUE and reported some changes on L side too.  He is encourage to work on getting the appropriate edema glove for R hand, elevating it, performing gentle retrograde massage and daily sensory stimulation to decrease sensitivity.  Pt will benefit from skilled OT services in the outpatient and aquatic settings to work on impairments and help pt return to higher level of functioning as able.    PERFORMANCE DEFICITS: in functional skills including dexterity, proprioception, sensation, tone, ROM, strength, pain, muscle spasms, Fine motor control, Gross motor control, endurance, and UE functional use  IMPAIRMENTS: are limiting patient from ADLs, IADLs, rest and sleep, work, and leisure.   CO-MORBIDITIES: may have co-morbidities  that affects occupational performance. Patient will benefit from skilled OT to address above impairments and improve overall function.  REHAB POTENTIAL: Good   PLAN:  OT FREQUENCY:  2x/week  OT DURATION: 12 weeks  PLANNED INTERVENTIONS: self care/ADL training, therapeutic exercise, therapeutic activity, neuromuscular re-education, manual therapy, passive range of motion, functional mobility training, aquatic therapy, splinting, electrical stimulation, ultrasound, paraffin, fluidotherapy, moist heat, cryotherapy, contrast bath, patient/family education, and DME and/or AE instructions  RECOMMENDED OTHER SERVICES: may have potential for counseling - discussed with patient - he feels he is ok now, but will keep it in mind  CONSULTED AND AGREED WITH PLAN OF CARE: Patient  PLAN FOR NEXT SESSION: Aquatic therapy to address gentle motion / gentle resistance with less influence of gravity.   Clinic - pt to bring in estim unit + instructions to practice using correct settings (possible TENS for shoulder pain, NMES for finger flexion), continue P/ROM, AA/ROM, and A/ROM as tolerated RUE.  Further desensitization activities/education Edema management   Victorino Sparrow, OT 05/06/2023, 4:07 PM

## 2023-05-10 ENCOUNTER — Ambulatory Visit: Payer: BC Managed Care – PPO | Admitting: Occupational Therapy

## 2023-05-12 ENCOUNTER — Encounter: Payer: Self-pay | Admitting: Student in an Organized Health Care Education/Training Program

## 2023-05-12 ENCOUNTER — Ambulatory Visit
Payer: BC Managed Care – PPO | Attending: Student in an Organized Health Care Education/Training Program | Admitting: Student in an Organized Health Care Education/Training Program

## 2023-05-12 ENCOUNTER — Ambulatory Visit: Admission: RE | Admit: 2023-05-12 | Payer: BC Managed Care – PPO | Source: Ambulatory Visit

## 2023-05-12 DIAGNOSIS — M792 Neuralgia and neuritis, unspecified: Secondary | ICD-10-CM | POA: Diagnosis not present

## 2023-05-12 DIAGNOSIS — G540 Brachial plexus disorders: Secondary | ICD-10-CM | POA: Insufficient documentation

## 2023-05-12 DIAGNOSIS — G894 Chronic pain syndrome: Secondary | ICD-10-CM | POA: Diagnosis not present

## 2023-05-12 DIAGNOSIS — G90511 Complex regional pain syndrome I of right upper limb: Secondary | ICD-10-CM | POA: Diagnosis not present

## 2023-05-12 MED ORDER — DIAZEPAM 5 MG PO TABS
ORAL_TABLET | ORAL | Status: AC
  Filled 2023-05-12: qty 1

## 2023-05-12 MED ORDER — LIDOCAINE HCL 2 % IJ SOLN
20.0000 mL | Freq: Once | INTRAMUSCULAR | Status: AC
  Administered 2023-05-12: 400 mg
  Filled 2023-05-12: qty 20

## 2023-05-12 MED ORDER — DIAZEPAM 5 MG PO TABS
5.0000 mg | ORAL_TABLET | ORAL | Status: AC
  Administered 2023-05-12: 5 mg via ORAL

## 2023-05-12 MED ORDER — ROPIVACAINE HCL 2 MG/ML IJ SOLN
4.0000 mL | Freq: Once | INTRAMUSCULAR | Status: AC
  Administered 2023-05-12: 4 mL via INTRA_ARTICULAR
  Filled 2023-05-12: qty 20

## 2023-05-12 MED ORDER — DEXAMETHASONE SODIUM PHOSPHATE 10 MG/ML IJ SOLN
10.0000 mg | Freq: Once | INTRAMUSCULAR | Status: AC
  Administered 2023-05-12: 10 mg
  Filled 2023-05-12: qty 1

## 2023-05-12 NOTE — Progress Notes (Signed)
Safety precautions to be maintained throughout the outpatient stay will include: orient to surroundings, keep bed in low position, maintain call bell within reach at all times, provide assistance with transfer out of bed and ambulation.  

## 2023-05-12 NOTE — Addendum Note (Signed)
Addended by: Edward Jolly on: 05/12/2023 10:58 AM   Modules accepted: Orders

## 2023-05-12 NOTE — Progress Notes (Signed)
PROVIDER NOTE: Interpretation of information contained herein should be left to medically-trained personnel. Specific patient instructions are provided elsewhere under "Patient Instructions" section of medical record. This document was created in part using STT-dictation technology, any transcriptional errors that may result from this process are unintentional.  Patient: Brian [name] Type: Established DOB: [birth year] MRN: [id number] PCP: [name], MD  Service: Procedure DOS: 05/12/2023 Setting: Ambulatory Location: Ambulatory outpatient facility Delivery: Face-to-face Provider: Edward Jolly, MD Specialty: Interventional Pain Management Specialty designation: 09 Location: Outpatient facility Ref. Prov.: Edward Jolly, MD       Interventional Therapy   Primary Reason for Visit: Interventional Pain Management Treatment. CC: Neck Pain (Base of neck on right, going down into chest and  arm to the finger tips.  Beginning to travel to the left side. )    Procedure:          Anesthesia, Analgesia, Anxiolysis:  Type: RIGHT INTERSCALENE BLOCK Region: lateral border of the sternocleidomastoid muscle was palpated to identify the inter-scalene groove between the anterior and middle scalene muscles. Laterality: Right-Sided   Anesthesia: Local (1-2% Lidocaine)  Anxiolysis:  Valium   Sedation: None  Guidance: Ultrasound           Position: Supine   1. Radiation-induced brachial plexopathy (RIGHT)   2. Complex regional pain syndrome type 1 of right upper extremity   3. 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 Chronic pain syndrome    NAS-11 Pain score:   Pre-procedure: 4 /10   Post-procedure: 5 /10      H&P (Pre-op Assessment):  Brian [name] is a 62 y.o. (year old), male patient, seen today for interventional treatment. He  has a past surgical history that includes Leg Surgery (age 61 ); IR IVC FILTER PLMT / S&I /IMG GUID/MOD SED (03/04/2020); Inguinal hernia repair (Right,  06/24/2020); IR Radiologist Eval & Mgmt (08/06/2020); and IR IVC FILTER RETRIEVAL / S&I /IMG GUID/MOD SED (08/15/2020). Brian [name] has a current medication list which includes the following prescription(s): albuterol, alecensa, amitriptyline, amlodipine, atorvastatin, baclofen, duloxetine, gabapentin, naproxen sodium, and trelegy ellipta. His primarily concern today is the Neck Pain (Base of neck on right, going down into chest and  arm to the finger tips.  Beginning to travel to the left side. )  Initial Vital Signs:  Pulse/HCG Rate: 86  Temp: 98 F (36.7 C) Resp: 16 BP: 113/82 SpO2: 99 %  BMI: Estimated body mass index is 27.06 kg/m as calculated from the following:   Height as of this encounter: 5\' 8"  (1.727 m).   Weight as of this encounter: 178 lb (80.7 kg).  Risk Assessment: Allergies: Reviewed. He has No Known Allergies.  Allergy Precautions: None required Coagulopathies: Reviewed. None identified.  Blood-thinner therapy: None at this time Active Infection(s): Reviewed. None identified. Brian [name] is afebrile  Site Confirmation: Brian [name] was asked to confirm the procedure and laterality before marking the site Procedure checklist: Completed Consent: Before the procedure and under the influence of no sedative(s), amnesic(s), or anxiolytics, the patient was informed of the treatment options, risks and possible complications. To fulfill our ethical and legal obligations, as recommended by the American Medical Association's Code of Ethics, I have informed the patient of my clinical impression; the nature and purpose of the treatment or procedure; the risks, benefits, and possible complications of the intervention; the alternatives, including doing nothing; the risk(s) and benefit(s) of the alternative treatment(s) or procedure(s); and the risk(s) and benefit(s) of doing nothing. The patient was  provided information about the general risks and possible complications associated with the  procedure. These may include, but are not limited to: failure to achieve desired goals, infection, bleeding, organ or nerve damage, allergic reactions, paralysis, and death. In addition, the patient was informed of those risks and complications associated to Spine-related procedures, such as failure to decrease pain; infection (i.e.: Meningitis, epidural or intraspinal abscess); bleeding (i.e.: epidural hematoma, subarachnoid hemorrhage, or any other type of intraspinal or peri-dural bleeding); organ or nerve damage (i.e.: Any type of peripheral nerve, nerve root, or spinal cord injury) with subsequent damage to sensory, motor, and/or autonomic systems, resulting in permanent pain, numbness, and/or weakness of one or several areas of the body; allergic reactions; (i.e.: anaphylactic reaction); and/or death. Furthermore, the patient was informed of those risks and complications associated with the medications. These include, but are not limited to: allergic reactions (i.e.: anaphylactic or anaphylactoid reaction(s)); adrenal axis suppression; blood sugar elevation that in diabetics may result in ketoacidosis or comma; water retention that in patients with history of congestive heart failure may result in shortness of breath, pulmonary edema, and decompensation with resultant heart failure; weight gain; swelling or edema; medication-induced neural toxicity; particulate matter embolism and blood vessel occlusion with resultant organ, and/or nervous system infarction; and/or aseptic necrosis of one or more joints. Finally, the patient was informed that Medicine is not an exact science; therefore, there is also the possibility of unforeseen or unpredictable risks and/or possible complications that may result in a catastrophic outcome. The patient indicated having understood very clearly. We have given the patient no guarantees and we have made no promises. Enough time was given to the patient to ask questions, all of  which were answered to the patient's satisfaction. Brian [name] has indicated that he wanted to continue with the procedure. Attestation: I, the ordering provider, attest that I have discussed with the patient the benefits, risks, side-effects, alternatives, likelihood of achieving goals, and potential problems during recovery for the procedure that I have provided informed consent. Date  Time: 05/12/2023  8:03 AM   Pre-Procedure Preparation:  Monitoring: As per clinic protocol. Respiration, ETCO2, SpO2, BP, heart rate and rhythm monitor placed and checked for adequate function Safety Precautions: Patient was assessed for positional comfort and pressure points before starting the procedure. Time-out: I initiated and conducted the "Time-out" before starting the procedure, as per protocol. The patient was asked to participate by confirming the accuracy of the "Time Out" information. Verification of the correct person, site, and procedure were performed and confirmed by me, the nursing staff, and the patient. "Time-out" conducted as per Joint Commission's Universal Protocol (UP.01.01.01). Time: 0832 Start Time: 0834 hrs.  Description of Procedure:           Area Prepped: Entire Anterior Neck Region DuraPrep (Iodine Povacrylex [0.7% available iodine] and Isopropyl Alcohol, 74% w/w) Safety Precautions: Aspiration looking for blood return was conducted prior to all injections. At no point did we inject any substances, as a needle was being advanced. No attempts were made at seeking any paresthesias. Safe injection practices and needle disposal techniques used. Medications properly checked for expiration dates. SDV (single dose vial) medications used. Description of the Procedure:   Ultrasound Preparation: A high-frequency linear ultrasound transducer (6-13 MHz) was used for visualization. The ultrasound machine was set up in a sterile manner with the transducer covered by a sterile probe cover. Sterile  gel was applied to the skin.  Ultrasound Visualization: The brachial plexus roots (C5-C7) were visualized  in the interscalene groove between the anterior and middle scalene muscles at the level of the cricoid cartilage (C6). The characteristic "stoplight" appearance of the nerve roots was confirmed on the ultrasound screen.  Needle Insertion: Using an in-plane technique, a 22-gauge, 50 mm PAJUNK insulated block needle was advanced in a lateral-to-medial direction under continuous ultrasound guidance, targeting the space adjacent to the C5 and C6 nerve roots.  Needle Confirmation and Aspiration: The needle tip was visualized on ultrasound as it approached the target area next to the nerve roots. Negative aspiration was performed to ensure the needle was not within a blood vessel.  Injection of Local Anesthetic: After confirming the needle placement adjacent to the nerve roots, 7mL solution containing 6 mL of 0.2% Ropivacaine and 1 cc of Decadron, 10mg /cc was injected incrementally, ensuring good spread of the anesthetic around the brachial plexus roots. Spread of local anesthetic was visualized in real-time, confirming proper perineural injection.  Needle Withdrawal and Completion: The needle was withdrawn carefully, and the injection site was cleaned and dressed with sterile gauze.   Vitals:   05/12/23 0835 05/12/23 0840 05/12/23 0845 05/12/23 0850  BP: 110/78 105/79 108/80 119/83  Pulse:    75  Resp:    20  Temp:      TempSrc:      SpO2:    93%  Weight:      Height:        Start Time: 0834 hrs. End Time: 0847 hrs. Materials:    Imaging Guidance (Spinal):          Type of Imaging Technique: Fluoroscopy Guidance (Spinal) Indication(s): Assistance in needle guidance and placement for procedures requiring needle placement in or near specific anatomical locations not easily accessible without such assistance. Exposure Time: Please see nurses notes. Contrast: Before injecting any  contrast, we confirmed that the patient did not have an allergy to iodine, shellfish, or radiological contrast. Once satisfactory needle placement was completed at the desired level, radiological contrast was injected. Contrast injected under live fluoroscopy. No contrast complications. See chart for type and volume of contrast used. Fluoroscopic Guidance: I was personally present during the use of fluoroscopy. "Tunnel Vision Technique" used to obtain the best possible view of the target area. Parallax error corrected before commencing the procedure. "Direction-depth-direction" technique used to introduce the needle under continuous pulsed fluoroscopy. Once target was reached, antero-posterior, oblique, and lateral fluoroscopic projection used confirm needle placement in all planes. Images permanently stored in EMR. Interpretation: I personally interpreted the imaging intraoperatively. Adequate needle placement confirmed in multiple planes. Appropriate spread of contrast into desired area was observed. No evidence of afferent or efferent intravascular uptake. No intrathecal or subarachnoid spread observed. Permanent images saved into the patient's record.  Antibiotic Prophylaxis:   Anti-infectives (From admission, onward)    None      Indication(s): None identified  Post-operative Assessment:  Post-procedure Vital Signs:  Pulse/HCG Rate: 75  Temp: 98 F (36.7 C) Resp: 20 BP: 119/83 SpO2: 93 %  EBL: None  Complications: No immediate post-treatment complications observed by team, or reported by patient.  Note: The patient tolerated the entire procedure well. A repeat set of vitals were taken after the procedure and the patient was kept under observation following institutional policy, for this type of procedure. Post-procedural neurological assessment was performed, showing return to baseline, prior to discharge. The patient was provided with post-procedure discharge instructions, including  a section on how to identify potential problems. Should any problems arise concerning this  procedure, the patient was given instructions to immediately contact us, at any time, without hesitation. In any case, we plan to contact the patient by telephone for a follow-up status report regarding this interventional procedure.  Comments:  No additional relevant information.  Plan of Care (POC)  Orders:  No orders of the defined types were placed in this encounter.   Medications ordered for procedure: Meds ordered this encounter  Medications   lidocaine (XYLOCAINE) 2 % (with pres) injection 400 mg   dexamethasone (DECADRON) injection 10 mg   ropivacaine (PF) 2 mg/mL (0.2%) (NAROPIN) injection 4 mL   Medications administered: We administered lidocaine, dexamethasone, and ropivacaine (PF) 2 mg/mL (0.2%).  See the medical record for exact dosing, route, and time of administration.  Follow-up plan:   Return in about 3 weeks (around 06/02/2023) for F2F PPE .      Recent Visits Date Type Provider Dept  05/04/23 Office Visit Edward Jolly, MD [facility]  Showing recent visits within past 90 days and meeting all other requirements Today's Visits Date Type Provider Dept  05/12/23 Procedure visit Edward Jolly, MD [facility]  Showing today's visits and meeting all other requirements Future Appointments Date Type Provider Dept  06/14/23 Appointment Edward Jolly, MD [facility]  Showing future appointments within next 90 days and meeting all other requirements  Disposition: Discharge home  Discharge (Date  Time): 05/12/2023; 0857 hrs.   Primary Care Physician: [name], MD Location: [facility] Note by: Edward Jolly, MD (TTS technology used. I apologize for any typographical errors that were not detected and corrected.) Date: 05/12/2023; Time: 9:19 AM  Disclaimer:  Medicine is not an Visual merchandiser. The only guarantee  in medicine is that nothing is guaranteed. It is important to note that the decision to proceed with this intervention was based on the information collected from the patient. The Data and conclusions were drawn from the patient's questionnaire, the interview, and the physical examination. Because the information was provided in large part by the patient, it cannot be guaranteed that it has not been purposely or unconsciously manipulated. Every effort has been made to obtain as much relevant data as possible for this evaluation. It is important to note that the conclusions that lead to this procedure are derived in large part from the available data. Always take into account that the treatment will also be dependent on availability of resources and existing treatment guidelines, considered by other Pain Management Practitioners as being common knowledge and practice, at the time of the intervention. For Medico-Legal purposes, it is also important to point out that variation in procedural techniques and pharmacological choices are the acceptable norm. The indications, contraindications, technique, and results of the above procedure should only be interpreted and judged by a Board-Certified Interventional Pain Specialist with extensive familiarity and expertise in the same exact procedure and technique.

## 2023-05-12 NOTE — Addendum Note (Signed)
Addended by: Earlyne Iba on: 05/12/2023 11:02 AM   Modules accepted: Orders

## 2023-05-12 NOTE — Patient Instructions (Signed)

## 2023-05-13 ENCOUNTER — Encounter: Payer: BC Managed Care – PPO | Admitting: Occupational Therapy

## 2023-05-13 ENCOUNTER — Telehealth: Payer: Self-pay

## 2023-05-13 NOTE — Telephone Encounter (Signed)
Post procedure follow up.  LM 

## 2023-05-14 ENCOUNTER — Ambulatory Visit: Payer: BC Managed Care – PPO | Admitting: "Endocrinology

## 2023-05-14 ENCOUNTER — Other Ambulatory Visit: Payer: BC Managed Care – PPO

## 2023-05-17 ENCOUNTER — Telehealth: Payer: Self-pay | Admitting: Student in an Organized Health Care Education/Training Program

## 2023-05-17 ENCOUNTER — Ambulatory Visit: Payer: BC Managed Care – PPO | Admitting: Occupational Therapy

## 2023-05-17 ENCOUNTER — Encounter: Payer: Self-pay | Admitting: Occupational Therapy

## 2023-05-17 DIAGNOSIS — R278 Other lack of coordination: Secondary | ICD-10-CM | POA: Diagnosis not present

## 2023-05-17 DIAGNOSIS — M79601 Pain in right arm: Secondary | ICD-10-CM

## 2023-05-17 DIAGNOSIS — M79602 Pain in left arm: Secondary | ICD-10-CM | POA: Diagnosis not present

## 2023-05-17 DIAGNOSIS — R609 Edema, unspecified: Secondary | ICD-10-CM

## 2023-05-17 DIAGNOSIS — M25641 Stiffness of right hand, not elsewhere classified: Secondary | ICD-10-CM | POA: Diagnosis not present

## 2023-05-17 NOTE — Telephone Encounter (Signed)
Patient states his pain came back severely over the weekend. He has f/u appt on 06-14-23 and is wondering if there is anything he can do to help alleviate that until he can get back in here.

## 2023-05-17 NOTE — Telephone Encounter (Signed)
Returned patient phone call, voicemail left.

## 2023-05-17 NOTE — Therapy (Signed)
OUTPATIENT OCCUPATIONAL THERAPY NEURO TREATMENT  Patient Name: Brian [name] MRN: [id number] DOB:11-Jun-1961, 62 y.o., male Today's Date: 05/17/2023  PCP: Keturah Barre REFERRING PROVIDER: Jacquelyne Balint  END OF SESSION:  OT End of Session - 05/17/23 1918     Visit Number 10    Number of Visits 25    Date for OT Re-Evaluation 06/28/23    Authorization Type [employer]  $1600 deductible has been met  20% coinsurance applies  $8500 oop, zero applied  VL combined with pt, ot, st: 60    OT Start Time 1545    OT Stop Time 1625    OT Time Calculation (min) 40 min    Activity Tolerance Patient tolerated treatment well    Behavior During Therapy WFL for tasks assessed/performed             Past Medical History:  Diagnosis Date   COPD (chronic obstructive pulmonary disease) (HCC)    Diabetes mellitus without complication (HCC)    Hypertension    nscl ca dx'd 08/2018   Pneumonia    Subarachnoid hemorrhage (HCC)    Past Surgical History:  Procedure Laterality Date   INGUINAL HERNIA REPAIR Right 06/24/2020   Procedure: RIGHT OPEN INGUINAL HERNIA REPAIR WITH MESH;  Surgeon: Kinsinger, De Blanch, MD;  Location: [facility];  Service: General;  Laterality: Right;   IR IVC FILTER PLMT / S&I Lenise Arena GUID/MOD SED  03/04/2020   IR IVC FILTER RETRIEVAL / S&I Lenise Arena GUID/MOD SED  08/15/2020   IR RADIOLOGIST EVAL & MGMT  08/06/2020   LEG SURGERY  age 44    left leg, from fracture   Patient Active Problem List   Diagnosis Date Noted   Complex regional pain syndrome type 1 of right upper extremity 05/04/2023   Right arm pain 05/04/2023   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   Recurrent right pleural effusion 07/23/2021   Hypothyroidism 10/24/2020   Acute hypoxemic respiratory failure (HCC) 05/19/2020   Acute respiratory failure with hypoxia (HCC) 05/18/2020   Essential hypertension 05/18/2020   Type 2 diabetes mellitus with hyperlipidemia (HCC) 05/18/2020   S/P thoracentesis 03/21/2020    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/05/2020   Calf swelling 02/21/2020   Rash 02/07/2020   Adenocarcinoma of right lung, stage 4 (HCC) 02/07/2020   Chronic cough 12/20/2019   Venous thrombosis 09/21/2019   Shortness of breath 09/07/2019   Headache 08/09/2019   Lobar pneumonia, unspecified organism (HCC) 03/07/2019   Centrilobular emphysema (HCC) 03/07/2019   Encounter for antineoplastic immunotherapy 11/10/2018   Adenocarcinoma of right lung, stage 3 (HCC) 08/25/2018   Goals of care, counseling/discussion 08/25/2018   Encounter for antineoplastic chemotherapy 08/25/2018   Mass of upper lobe of right lung 08/04/2018   Subarachnoid hemorrhage (HCC) 05/22/2015    ONSET DATE: 03/17/23 Referral  REFERRING DIAG: UE Pain and paresthesias  THERAPY DIAG:  Pain in right arm  Stiffness of right hand, not elsewhere classified  Other lack of coordination  Edema, unspecified type  Pain in left arm  Rationale for Evaluation and Treatment: Rehabilitation  SUBJECTIVE:   SUBJECTIVE STATEMENT:   Pt reports things having more intense and constant pain an tingling up and down his arm but unable to attribute it to anything. Has slept well the last 2 nights but has medication switched up to help - New med Elavil & tapering off Gabapentin - now at just 2 at night x 15 days and then off.  Next Wednesday pt is having external sternal nerve stimulator  installed and if that goes well, they will do an internal one later.   Pt accompanied by: self  PERTINENT HISTORY: Lung cancer w/ radiation, [facility] 2016. Pt reports developing symptoms RUE this February and worsening symptoms in Rt hand more recently - MD thinks this is due to radiation approx 4 years ago  PRECAUTIONS: None  WEIGHT BEARING RESTRICTIONS: No  PAIN:  Are you having pain? Yes: NPRS scale: 7/10 Pain location: right arm - mostly hand but into shoulder and also reports it is starting to get tingling on L side as well into his  hand Pain description: sharp, stabbing, throbbing, aching Aggravating factors: idle - positional Relieving factors: heat, ice, gabapentin  FALLS: Has patient fallen in last 6 months? Yes. Number of falls 1- fell last night  LIVING ENVIRONMENT: Lives with: lives with their family and lives with their spouse Lives in: House/apartment Stairs: Yes: External: 2 steps; none Has following equipment at home:  has built in shower seat  PLOF: Independent with basic ADLs  PATIENT GOALS: to be able to get back to some level of use of my right hand  OBJECTIVE:   HAND DOMINANCE: Right  ADLs: Overall ADLs: min assist and increased time Transfers/ambulation related to ADLs: Eating: min assist - wife assists with cutting.  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 driving - uses LUE only at this point Handwriting: unable   POSTURE COMMENTS:  forward head and posterior pelvic tilt   ACTIVITY TOLERANCE: Activity tolerance: reports more fatigue due to constant pain   UPPER EXTREMITY ROM:    Active ROM Right eval Left eval  Shoulder flexion 115 unable to maintain ext elbow 140  Shoulder abduction    Shoulder adduction    Shoulder extension    Shoulder internal rotation    Shoulder external rotation    Elbow flexion WFL   Elbow extension WFL   Wrist flexion    Wrist extension 30   Wrist ulnar deviation 10   Wrist radial deviation 5   Wrist pronation    Wrist supination    (Blank rows = not tested)  UPPER EXTREMITY MMT:     MMT Right eval Left eval  Shoulder flexion NT 4-/5  Shoulder abduction    Shoulder adduction     Shoulder extension    Shoulder internal rotation    Shoulder external rotation    Middle trapezius    Lower trapezius    Elbow flexion    Elbow extension    Wrist flexion 3   Wrist extension 3   Wrist ulnar deviation    Wrist radial deviation    Wrist pronation    Wrist supination    (Blank rows = not tested) RIGHT HAND - 15% COMPOSITE FLEXION     - 40% COMPOSITE EXTENSION HAND FUNCTION: Grip strength: Right: UNABLE lbs; Left: 31.9 lbs  COORDINATION: Finger Nose Finger test: Mild undershooting Left - right lacks strength/ proximal control to complete  SENSATION: WFL to testing, but reports heightened sensation - hypersensitive  EDEMA: Atrophy RUE - UPPER ARM, FOREARM  MUSCLE TONE: RUE: Moderate and Hypotonic and LUE: Within functional limits  COGNITION: Overall cognitive status: Within functional limits for tasks assessed.  Reports some concentration deficits which he attributes to pain  VISION: Subjective report: no changes Baseline vision: Wears glasses all the time Visual history:  nothing significant  VISION ASSESSMENT: Not tested  Patient has difficulty with following activities due to following visual impairments: NA  PERCEPTION: WFL  PRAXIS: WFL   TODAY'S TREATMENT:                                                                                                                              DATE:   05/17/23: Patient seen for aquatic therapy visit.  Patient warms up in hot tub prior to session.  Patient entered and exited the pool independently via stairs and one hand rail.  Patient missed pool therapy last week due to schedule conflict.  Patient had nerve block last week, and reported 2 days with almost no pain, then had severe pain over the weekend.  Pain today back to baseline 4/10.   Started session with buoyancy assisted and resisted gentle movement in BUE.  Followed with supine float for body on arm motion to allow movement in head/neck/ rib cage/  lateral trunk.    Soft tissue mob to posterior inferior shoulder girdle at inferior angle and lateral border/ along lats.  Patient able to swim on hs back with floatation belt and neck support.     05/06/23:   Manual Techniques: Pt has observable swelling and glassy skin on R hand with pt reporting suggestion of CRPS (complex regional pain syndrome) being mentioned to him based on this observation.  Pt educated in retrograde massage with OT providing demonstration and carryover of task with improved flexibility of finger flexion for AAROM activities to follow.  Estim/TENS OT brought his own estim/TENS unit to use today and it was used to improve nerve function as needed to produce more active RUE wrist/digital flexion. Therapist educated patient on application for RUE wrist and digital flexion and applied electrodes accordingly. Therapist utilized preset 'hand' setting with patient adjusting the intensity to his tolerance and to elicit some active motion with therapist assisting pt to increase grasp with RUE during NMES treatment.   Therapeutic Activities  Initiated education re sensory stimulation of R UE for interrupting negative sensory messages including use of massage, different textures, different temperatures and even weighted objects etc.  He does use a heat pad and cold pack in bed at times and was provided frequent tactile input to R hand and forearm throughout session to work on grasp with R digits with hand over hand support during estim application.   PATIENT EDUCATION: Education details: Futures trader Person educated: Patient Education method: Explanation, Demonstration, and Verbal cues Education comprehension: verbalized understanding, returned demonstration, and verbal cues required  HOME EXERCISE PROGRAM: 04/14/23: bed positioning, AA/ROM HEP for RUE 04/22/23: passive stretching HEP for Rt hand, edema management strategies   GOALS: Goals reviewed with patient?  Yes  SHORT TERM GOALS: Target date: 04/30/23  Patient will complete HEP designed  to improve Bilateral shoulder range of motion  Goal status: IN PROGRESS  2.  Patient will complete HEP designed to improve R elbow flex/ext strength  Goal status: IN PROGRESS  3.  Patient will complete HEP designed to improve range of motion for composite flexion and extension in right hand/wrist  Goal status: IN PROGRESS  4.  Patient will demonstrate ability to pick up and retrieve lightweight object from table with RUE - e.g. cell phone  Goal status: IN PROGRESS  5.  Patient will report improved awareness of positional changes/ support techniques to reduce UE pain.    Goal status: IN PROGRESS  6.  Patient will demonstrate 50% composite active flex/ext in right hand  Goal status: IN PROGRESS   LONG TERM GOALS: Target date: 06/28/23  Patient will complete updated HEP designed on UE strengthening without increase in pain  Goal status: IN PROGRESS  2.  Patient will demonstrate 10 lb grip strength R hand  Goal status: IN PROGRESS  3.  Patient will demonstrate low reach RUE to obtain and release a 2 lb object x 5 without dropping  Goal status: IN PROGRESS  4.  Patient will be able to cut his own food with modified independence  Goal status: IN PROGRESS  5.  Patient will be independent with aquatic exercise program addressing BUE active movement and strength  Goal status: IN PROGRESS  6.  Patient will dress himself with modified independence  Goal status: IN PROGRESS  ASSESSMENT:  CLINICAL IMPRESSION: Patient is seen today for occupational therapy treatment for RUE swelling, weakness, pain and sensory changes. Patient currently presents well below baseline level of function with his dominant RUE and reported some changes on L side too.  He is encourage to work on getting the appropriate edema glove for R hand, elevating it, performing gentle retrograde massage and daily sensory  stimulation to decrease sensitivity.  Pt will benefit from skilled OT services in the outpatient and aquatic settings to work on impairments and help pt return to higher level of functioning as able.    PERFORMANCE DEFICITS: in functional skills including dexterity, proprioception, sensation, tone, ROM, strength, pain, muscle spasms, Fine motor control, Gross motor control, endurance, and UE functional use  IMPAIRMENTS: are limiting patient from ADLs, IADLs, rest and sleep, work, and leisure.   CO-MORBIDITIES: may have co-morbidities  that affects occupational performance. Patient will benefit from skilled OT to address above impairments and improve overall function.  REHAB POTENTIAL: Good   PLAN:  OT FREQUENCY: 2x/week  OT DURATION: 12 weeks  PLANNED INTERVENTIONS: self care/ADL training, therapeutic exercise, therapeutic activity, neuromuscular re-education, manual therapy, passive range of motion, functional mobility training, aquatic therapy, splinting, electrical stimulation, ultrasound, paraffin, fluidotherapy, moist heat, cryotherapy, contrast bath, patient/family education, and DME and/or AE instructions  RECOMMENDED OTHER SERVICES: may have potential for counseling - discussed with patient - he feels he is ok now, but will keep it in mind  CONSULTED AND AGREED WITH PLAN OF CARE: Patient  PLAN FOR NEXT SESSION: Aquatic therapy to address gentle motion / gentle resistance with less influence of gravity.   Clinic - pt to bring in estim unit + instructions to practice using correct settings (possible TENS for shoulder pain, NMES for finger flexion), continue P/ROM, AA/ROM, and A/ROM as tolerated RUE.  Further desensitization activities/education Edema management   [name] Salina, OT 05/17/2023, 7:19 PM

## 2023-05-20 ENCOUNTER — Ambulatory Visit: Payer: BC Managed Care – PPO | Admitting: Occupational Therapy

## 2023-05-20 ENCOUNTER — Encounter: Payer: Self-pay | Admitting: Occupational Therapy

## 2023-05-20 DIAGNOSIS — R609 Edema, unspecified: Secondary | ICD-10-CM | POA: Diagnosis not present

## 2023-05-20 DIAGNOSIS — M25641 Stiffness of right hand, not elsewhere classified: Secondary | ICD-10-CM | POA: Diagnosis not present

## 2023-05-20 DIAGNOSIS — R278 Other lack of coordination: Secondary | ICD-10-CM | POA: Diagnosis not present

## 2023-05-20 DIAGNOSIS — M79601 Pain in right arm: Secondary | ICD-10-CM | POA: Diagnosis not present

## 2023-05-20 DIAGNOSIS — M79602 Pain in left arm: Secondary | ICD-10-CM | POA: Diagnosis not present

## 2023-05-20 NOTE — Therapy (Signed)
OUTPATIENT OCCUPATIONAL THERAPY NEURO TREATMENT  Patient Name: Brian [name] MRN: [id number] DOB:February 14, 1961, 62 y.o., male Today's Date: 05/20/2023  PCP: Keturah Barre REFERRING PROVIDER: Jacquelyne Balint  END OF SESSION:  OT End of Session - 05/20/23 0805     Visit Number 11    Number of Visits 25    Date for OT Re-Evaluation 06/28/23    Authorization Type [employer]  $1600 deductible has been met  20% coinsurance applies  $8500 oop, zero applied  VL combined with pt, ot, st: 60    OT Start Time 0803    OT Stop Time 0845    OT Time Calculation (min) 42 min    Activity Tolerance Patient tolerated treatment well    Behavior During Therapy WFL for tasks assessed/performed             Past Medical History:  Diagnosis Date   COPD (chronic obstructive pulmonary disease) (HCC)    Diabetes mellitus without complication (HCC)    Hypertension    nscl ca dx'd 08/2018   Pneumonia    Subarachnoid hemorrhage (HCC)    Past Surgical History:  Procedure Laterality Date   INGUINAL HERNIA REPAIR Right 06/24/2020   Procedure: RIGHT OPEN INGUINAL HERNIA REPAIR WITH MESH;  Surgeon: Kinsinger, De Blanch, MD;  Location: [facility];  Service: General;  Laterality: Right;   IR IVC FILTER PLMT / S&I Lenise Arena GUID/MOD SED  03/04/2020   IR IVC FILTER RETRIEVAL / S&I Lenise Arena GUID/MOD SED  08/15/2020   IR RADIOLOGIST EVAL & MGMT  08/06/2020   LEG SURGERY  age 39    left leg, from fracture   Patient Active Problem List   Diagnosis Date Noted   Complex regional pain syndrome type 1 of right upper extremity 05/04/2023   Right arm pain 05/04/2023   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   Recurrent right pleural effusion 07/23/2021   Hypothyroidism 10/24/2020   Acute hypoxemic respiratory failure (HCC) 05/19/2020   Acute respiratory failure with hypoxia (HCC) 05/18/2020   Essential hypertension 05/18/2020   Type 2 diabetes mellitus with hyperlipidemia (HCC) 05/18/2020   S/P thoracentesis 03/21/2020    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/05/2020   Calf swelling 02/21/2020   Rash 02/07/2020   Adenocarcinoma of right lung, stage 4 (HCC) 02/07/2020   Chronic cough 12/20/2019   Venous thrombosis 09/21/2019   Shortness of breath 09/07/2019   Headache 08/09/2019   Lobar pneumonia, unspecified organism (HCC) 03/07/2019   Centrilobular emphysema (HCC) 03/07/2019   Encounter for antineoplastic immunotherapy 11/10/2018   Adenocarcinoma of right lung, stage 3 (HCC) 08/25/2018   Goals of care, counseling/discussion 08/25/2018   Encounter for antineoplastic chemotherapy 08/25/2018   Mass of upper lobe of right lung 08/04/2018   Subarachnoid hemorrhage (HCC) 05/22/2015    ONSET DATE: 03/17/23 Referral  REFERRING DIAG: UE Pain and paresthesias  THERAPY DIAG:  Pain in right arm  Stiffness of right hand, not elsewhere classified  Other lack of coordination  Rationale for Evaluation and Treatment: Rehabilitation  SUBJECTIVE:   SUBJECTIVE STATEMENT:   Pt reports numbness beginning in LUE. Pt also reports they did not do a nerve stimulator but a nerve block.   Pt accompanied by: self  PERTINENT HISTORY: Lung cancer w/ radiation, [facility] 2016. Pt reports developing symptoms RUE this February and worsening symptoms in Rt hand more recently - MD thinks this is due to radiation approx 4 years ago  PRECAUTIONS: None  WEIGHT BEARING RESTRICTIONS: No  PAIN:  Are you having pain?  Yes: NPRS scale: 3/10 Pain location: right arm/shoulder - mostly shoulder but also into hand and also reports it is starting to get tingling on L side as well into his hand Pain description: sharp, stabbing, throbbing, aching Aggravating factors: idle - positional Relieving factors: heat, ice, gabapentin  FALLS: Has patient fallen in last 6 months? Yes. Number of falls 1- fell last night  LIVING ENVIRONMENT: Lives with: lives with their family and lives with their spouse Lives in:  House/apartment Stairs: Yes: External: 2 steps; none Has following equipment at home:  has built in shower seat  PLOF: Independent with basic ADLs  PATIENT GOALS: to be able to get back to some level of use of my right hand  OBJECTIVE:   HAND DOMINANCE: Right  ADLs: Overall ADLs: min assist and increased time Transfers/ambulation related to ADLs: Eating: min assist - wife assists with cutting.  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 driving - uses LUE only at this point Handwriting: unable   POSTURE COMMENTS:  forward head and posterior pelvic tilt   ACTIVITY TOLERANCE: Activity tolerance: reports more fatigue due to constant pain   UPPER EXTREMITY ROM:    Active ROM Right eval Left eval  Shoulder flexion 115 unable to maintain ext elbow 140  Shoulder abduction    Shoulder adduction    Shoulder extension    Shoulder internal rotation    Shoulder external rotation    Elbow flexion WFL   Elbow extension WFL   Wrist flexion    Wrist extension 30   Wrist ulnar deviation 10   Wrist radial deviation 5   Wrist pronation    Wrist supination    (Blank rows = not tested)  UPPER EXTREMITY MMT:     MMT Right eval Left eval  Shoulder flexion NT 4-/5  Shoulder abduction    Shoulder adduction    Shoulder extension    Shoulder internal rotation    Shoulder external rotation    Middle trapezius    Lower trapezius    Elbow flexion    Elbow extension    Wrist flexion 3   Wrist extension 3   Wrist ulnar deviation    Wrist radial deviation    Wrist pronation    Wrist supination    (Blank rows = not  tested) RIGHT HAND - 15% COMPOSITE FLEXION     - 40% COMPOSITE EXTENSION HAND FUNCTION: Grip strength: Right: UNABLE lbs; Left: 31.9 lbs  COORDINATION: Finger Nose Finger test: Mild undershooting Left - right lacks strength/ proximal control to complete  SENSATION: WFL to testing, but reports heightened sensation - hypersensitive  EDEMA: Atrophy RUE - UPPER ARM, FOREARM  MUSCLE TONE: RUE: Moderate and Hypotonic and LUE: Within functional limits  COGNITION: Overall cognitive status: Within functional limits for tasks assessed.  Reports some concentration deficits which he attributes to pain  VISION: Subjective report: no changes Baseline vision: Wears glasses all the time Visual history:  nothing significant  VISION ASSESSMENT: Not tested  Patient has difficulty with following activities due to following visual impairments: NA  PERCEPTION: WFL  PRAXIS: WFL   TODAY'S TREATMENT:  DATE:  05/20/23:  Reviewed electrode placement and time for each TENS vs. Estim (4 electrodes for shoulder pain w/ TENS up to 30 min at a time;  2 electrodes for volar forearm for estim to finger flexors as able x 10-15 min at a time.)   Pt placed clinic estim on volar forearm for finger flexion with trace movement noted, Int = 55, 50 pw, 248 pw, Ramp on/off 2 sec, and on/off time 10 sec.  While applied heat to Rt shoulder for pain management  Reviewed AA/ROM for RUE and passive stretches for each joint of digits in Rt hand, isolated and compositely. Therapist stretched hand in composite flex, MP flexion, and IP flexion  Pt reports some numbness beginning in LUE - Recommended pt discuss with neurologist   05/17/23: Patient seen for aquatic therapy visit.  Patient warms up in hot tub prior to session.  Patient entered and exited the pool independently via stairs and one hand  rail.  Patient missed pool therapy last week due to schedule conflict.  Patient had nerve block last week, and reported 2 days with almost no pain, then had severe pain over the weekend.  Pain today back to baseline 4/10.   Started session with buoyancy assisted and resisted gentle movement in BUE.  Followed with supine float for body on arm motion to allow movement in head/neck/ rib cage/ lateral trunk.    Soft tissue mob to posterior inferior shoulder girdle at inferior angle and lateral border/ along lats.  Patient able to swim on hs back with floatation belt and neck support.     PATIENT EDUCATION: Education details: Futures trader Person educated: Patient Education method: Explanation, Demonstration, and Verbal cues Education comprehension: verbalized understanding, returned demonstration, and verbal cues required  HOME EXERCISE PROGRAM: 04/14/23: bed positioning, AA/ROM HEP for RUE 04/22/23: passive stretching HEP for Rt hand, edema management strategies   GOALS: Goals reviewed with patient? Yes  SHORT TERM GOALS: Target date: 04/30/23  Patient will complete HEP designed to improve Bilateral shoulder range of motion  Goal status: IN PROGRESS  2.  Patient will complete HEP designed to improve R elbow flex/ext strength  Goal status: IN PROGRESS  3.  Patient will complete HEP designed to improve range of motion for composite flexion and extension in right hand/wrist  Goal status: IN PROGRESS  4.  Patient will demonstrate ability to pick up and retrieve lightweight object from table with RUE - e.g. cell phone  Goal status: IN PROGRESS  5.  Patient will report improved awareness of positional changes/ support techniques to reduce UE pain.    Goal status: IN PROGRESS  6.  Patient will demonstrate 50% composite active flex/ext in right hand  Goal status: IN PROGRESS   LONG TERM GOALS: Target date: 06/28/23  Patient will complete updated HEP designed on UE  strengthening without increase in pain  Goal status: IN PROGRESS  2.  Patient will demonstrate 10 lb grip strength R hand  Goal status: IN PROGRESS  3.  Patient will demonstrate low reach RUE to obtain and release a 2 lb object x 5 without dropping  Goal status: IN PROGRESS  4.  Patient will be able to cut his own food with modified independence  Goal status: IN PROGRESS  5.  Patient will be independent with aquatic exercise program addressing BUE active movement and strength  Goal status: IN PROGRESS  6.  Patient will dress himself with modified independence  Goal status: IN PROGRESS  ASSESSMENT:  CLINICAL IMPRESSION: Patient is seen today for occupational therapy treatment for RUE swelling, weakness, pain and sensory changes. Patient currently presents well below baseline level of function with his dominant RUE and reported some changes on L side too.  He is encourage to work on getting the appropriate edema glove for R hand, elevating it, performing gentle retrograde massage and daily sensory stimulation to decrease sensitivity.  Pt will benefit from skilled OT services in the outpatient and aquatic settings to work on impairments and help pt return to higher level of functioning as able.    PERFORMANCE DEFICITS: in functional skills including dexterity, proprioception, sensation, tone, ROM, strength, pain, muscle spasms, Fine motor control, Gross motor control, endurance, and UE functional use  IMPAIRMENTS: are limiting patient from ADLs, IADLs, rest and sleep, work, and leisure.   CO-MORBIDITIES: may have co-morbidities  that affects occupational performance. Patient will benefit from skilled OT to address above impairments and improve overall function.  REHAB POTENTIAL: Good   PLAN:  OT FREQUENCY: 2x/week  OT DURATION: 12 weeks  PLANNED INTERVENTIONS: self care/ADL training, therapeutic exercise, therapeutic activity, neuromuscular re-education, manual therapy,  passive range of motion, functional mobility training, aquatic therapy, splinting, electrical stimulation, ultrasound, paraffin, fluidotherapy, moist heat, cryotherapy, contrast bath, patient/family education, and DME and/or AE instructions  RECOMMENDED OTHER SERVICES: may have potential for counseling - discussed with patient - he feels he is ok now, but will keep it in mind  CONSULTED AND AGREED WITH PLAN OF CARE: Patient  PLAN FOR NEXT SESSION: Aquatic therapy to address gentle motion / gentle resistance with less influence of gravity.   A/ROM as tolerated RUE.  Further desensitization activities/education Edema management   Sheran Lawless, OT 05/20/2023, 8:05 AM

## 2023-05-24 ENCOUNTER — Encounter: Payer: Self-pay | Admitting: Occupational Therapy

## 2023-05-24 ENCOUNTER — Ambulatory Visit: Payer: BC Managed Care – PPO | Admitting: Occupational Therapy

## 2023-05-24 DIAGNOSIS — M79602 Pain in left arm: Secondary | ICD-10-CM

## 2023-05-24 DIAGNOSIS — M25641 Stiffness of right hand, not elsewhere classified: Secondary | ICD-10-CM | POA: Diagnosis not present

## 2023-05-24 DIAGNOSIS — R278 Other lack of coordination: Secondary | ICD-10-CM

## 2023-05-24 DIAGNOSIS — R609 Edema, unspecified: Secondary | ICD-10-CM

## 2023-05-24 DIAGNOSIS — M79601 Pain in right arm: Secondary | ICD-10-CM | POA: Diagnosis not present

## 2023-05-24 NOTE — Therapy (Signed)
OUTPATIENT OCCUPATIONAL THERAPY NEURO TREATMENT  Patient Name: Brian [name] MRN: [id number] DOB:1960-09-23, 62 y.o., male Today's Date: 05/24/2023  PCP: Keturah Barre REFERRING PROVIDER: Jacquelyne Balint  END OF SESSION:  OT End of Session - 05/24/23 1735     Visit Number 12    Number of Visits 25    Date for OT Re-Evaluation 06/28/23    Authorization Type BCBS  $1600 deductible has been met  20% coinsurance applies  $8500 oop, zero applied  VL combined with pt, ot, st: 60    OT Start Time 1545    OT Stop Time 1617    OT Time Calculation (min) 32 min    Equipment Utilized During Treatment water resistance equipment    Activity Tolerance Patient tolerated treatment well    Behavior During Therapy WFL for tasks assessed/performed             Past Medical History:  Diagnosis Date   COPD (chronic obstructive pulmonary disease) (HCC)    Diabetes mellitus without complication (HCC)    Hypertension    nscl ca dx'd 08/2018   Pneumonia    Subarachnoid hemorrhage (HCC)    Past Surgical History:  Procedure Laterality Date   INGUINAL HERNIA REPAIR Right 06/24/2020   Procedure: RIGHT OPEN INGUINAL HERNIA REPAIR WITH MESH;  Surgeon: Kinsinger, De Blanch, MD;  Location: [facility];  Service: General;  Laterality: Right;   IR IVC FILTER PLMT / S&I Lenise Arena GUID/MOD SED  03/04/2020   IR IVC FILTER RETRIEVAL / S&I Lenise Arena GUID/MOD SED  08/15/2020   IR RADIOLOGIST EVAL & MGMT  08/06/2020   LEG SURGERY  age 60    left leg, from fracture   Patient Active Problem List   Diagnosis Date Noted   Complex regional pain syndrome type 1 of right upper extremity 05/04/2023   Right arm pain 05/04/2023   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   Recurrent right pleural effusion 07/23/2021   Hypothyroidism 10/24/2020   Acute hypoxemic respiratory failure (HCC) 05/19/2020   Acute respiratory failure with hypoxia (HCC) 05/18/2020   Essential hypertension 05/18/2020   Type 2 diabetes mellitus with  hyperlipidemia (HCC) 05/18/2020   S/P thoracentesis 03/21/2020   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/05/2020   Calf swelling 02/21/2020   Rash 02/07/2020   Adenocarcinoma of right lung, stage 4 (HCC) 02/07/2020   Chronic cough 12/20/2019   Venous thrombosis 09/21/2019   Shortness of breath 09/07/2019   Headache 08/09/2019   Lobar pneumonia, unspecified organism (HCC) 03/07/2019   Centrilobular emphysema (HCC) 03/07/2019   Encounter for antineoplastic immunotherapy 11/10/2018   Adenocarcinoma of right lung, stage 3 (HCC) 08/25/2018   Goals of care, counseling/discussion 08/25/2018   Encounter for antineoplastic chemotherapy 08/25/2018   Mass of upper lobe of right lung 08/04/2018   Subarachnoid hemorrhage (HCC) 05/22/2015    ONSET DATE: 03/17/23 Referral  REFERRING DIAG: UE Pain and paresthesias  THERAPY DIAG:  Pain in right arm  Stiffness of right hand, not elsewhere classified  Other lack of coordination  Edema, unspecified type  Pain in left arm  Rationale for Evaluation and Treatment: Rehabilitation  SUBJECTIVE:   SUBJECTIVE STATEMENT:   Pt reports numbness beginning in LUE. Pt also reports they did not do a nerve stimulator but a nerve block.   Pt accompanied by: self  PERTINENT HISTORY: Lung cancer w/ radiation, SAH 2016. Pt reports developing symptoms RUE this February and worsening symptoms in Rt hand more recently - MD thinks this is due to radiation  approx 4 years ago  PRECAUTIONS: None  WEIGHT BEARING RESTRICTIONS: No  PAIN:  Are you having pain? Yes: NPRS scale: 3/10 Pain location: right arm/shoulder - mostly shoulder but also into hand and also reports it is starting to get tingling on L side as well into his hand Pain description: sharp, stabbing, throbbing, aching Aggravating factors: idle - positional Relieving factors: heat, ice, gabapentin  FALLS: Has patient fallen in last 6 months? Yes. Number of falls 1- fell last  night  LIVING ENVIRONMENT: Lives with: lives with their family and lives with their spouse Lives in: House/apartment Stairs: Yes: External: 2 steps; none Has following equipment at home:  has built in shower seat  PLOF: Independent with basic ADLs  PATIENT GOALS: to be able to get back to some level of use of my right hand  OBJECTIVE:   HAND DOMINANCE: Right  ADLs: Overall ADLs: min assist and increased time Transfers/ambulation related to ADLs: Eating: min assist - wife assists with cutting.  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 driving - uses LUE only at this point Handwriting: unable   POSTURE COMMENTS:  forward head and posterior pelvic tilt   ACTIVITY TOLERANCE: Activity tolerance: reports more fatigue due to constant pain   UPPER EXTREMITY ROM:    Active ROM Right eval Left eval  Shoulder flexion 115 unable to maintain ext elbow 140  Shoulder abduction    Shoulder adduction    Shoulder extension    Shoulder internal rotation    Shoulder external rotation    Elbow flexion WFL   Elbow extension WFL   Wrist flexion    Wrist extension 30   Wrist ulnar deviation 10   Wrist radial deviation 5   Wrist pronation    Wrist supination    (Blank rows = not tested)  UPPER EXTREMITY MMT:     MMT Right eval Left eval  Shoulder flexion NT 4-/5  Shoulder abduction    Shoulder adduction    Shoulder extension    Shoulder internal rotation    Shoulder external rotation    Middle trapezius    Lower trapezius    Elbow flexion    Elbow extension    Wrist flexion 3   Wrist extension 3   Wrist ulnar  deviation    Wrist radial deviation    Wrist pronation    Wrist supination    (Blank rows = not tested) RIGHT HAND - 15% COMPOSITE FLEXION     - 40% COMPOSITE EXTENSION HAND FUNCTION: Grip strength: Right: UNABLE lbs; Left: 31.9 lbs  COORDINATION: Finger Nose Finger test: Mild undershooting Left - right lacks strength/ proximal control to complete  SENSATION: WFL to testing, but reports heightened sensation - hypersensitive  EDEMA: Atrophy RUE - UPPER ARM, FOREARM  MUSCLE TONE: RUE: Moderate and Hypotonic and LUE: Within functional limits  COGNITION: Overall cognitive status: Within functional limits for tasks assessed.  Reports some concentration deficits which he attributes to pain  VISION: Subjective report: no changes Baseline vision: Wears glasses all the time Visual history:  nothing significant  VISION ASSESSMENT: Not tested  Patient has difficulty with following activities due to following visual impairments: NA  PERCEPTION: WFL  PRAXIS:  WFL   TODAY'S TREATMENT:                                                                                                                              DATE:  0/14/24: Patient seen for aquatic therapy visit.  Patient warms up in hot tub prior to session.  Patient entered and exited the pool independently via stairs and one hand rail.   Pain today at start of session 2/10, however had extended time at desk workign on budget and reports pain 7-8/10 over last few days.  Discussed standing desk and alternating position between sitting and standing.  Patient feels he may have option of use of standing desk at work.   Started session with buoyancy assisted and resisted gentle movement in BUE.  Used lightest dumbbell for LUE - now reporting similar pain / numbness in  LUE although much less significant than RUE.  Used cuff float to increase buoyancy thus increasing resistance on RUE - this was challenging.  Downgraded to paddle cuff for mild  resistance to RUE.  Worked on improving whole body and arm motion with modified AiChi exercises in standing.  Patient with significantly improved external rotation (humeral dependent position) and IR with extension this session.   05/20/23:  Reviewed electrode placement and time for each TENS vs. Estim (4 electrodes for shoulder pain w/ TENS up to 30 min at a time;  2 electrodes for volar forearm for estim to finger flexors as able x 10-15 min at a time.)   Pt placed clinic estim on volar forearm for finger flexion with trace movement noted, Int = 55, 50 pw, 248 pw, Ramp on/off 2 sec, and on/off time 10 sec.  While applied heat to Rt shoulder for pain management  Reviewed AA/ROM for RUE and passive stretches for each joint of digits in Rt hand, isolated and compositely. Therapist stretched hand in composite flex, MP flexion, and IP flexion  Pt reports some numbness beginning in LUE - Recommended pt discuss with neurologist    PATIENT EDUCATION: Education details: Sensory stimulation Person educated: Patient Education method: Explanation, Demonstration, and Verbal cues Education comprehension: verbalized understanding, returned demonstration, and verbal cues required  HOME EXERCISE PROGRAM: 04/14/23: bed positioning, AA/ROM HEP for RUE 04/22/23: passive stretching HEP for Rt hand, edema management strategies   GOALS: Goals reviewed with patient? Yes  SHORT TERM GOALS: Target date: 04/30/23  Patient will complete HEP designed to improve Bilateral shoulder range of motion  Goal status: IN PROGRESS  2.  Patient will complete HEP designed to improve R elbow flex/ext strength  Goal status: IN PROGRESS  3.  Patient will complete HEP designed to improve range of motion for composite flexion and extension in right hand/wrist  Goal status: IN PROGRESS  4.  Patient will demonstrate ability to pick up and retrieve lightweight object from table with RUE - e.g. cell phone  Goal status: IN  PROGRESS  5.  Patient  will report improved awareness of positional changes/ support techniques to reduce UE pain.    Goal status: IN PROGRESS  6.  Patient will demonstrate 50% composite active flex/ext in right hand  Goal status: IN PROGRESS   LONG TERM GOALS: Target date: 06/28/23  Patient will complete updated HEP designed on UE strengthening without increase in pain  Goal status: IN PROGRESS  2.  Patient will demonstrate 10 lb grip strength R hand  Goal status: IN PROGRESS  3.  Patient will demonstrate low reach RUE to obtain and release a 2 lb object x 5 without dropping  Goal status: IN PROGRESS  4.  Patient will be able to cut his own food with modified independence  Goal status: IN PROGRESS  5.  Patient will be independent with aquatic exercise program addressing BUE active movement and strength  Goal status: IN PROGRESS  6.  Patient will dress himself with modified independence  Goal status: IN PROGRESS  ASSESSMENT:  CLINICAL IMPRESSION: Patient is seen today for occupational therapy treatment for RUE swelling, weakness, pain and sensory changes. Patient currently presents well below baseline level of function with his dominant RUE and reported some changes on L side too.  He is encourage to work on getting the appropriate edema glove for R hand, elevating it, performing gentle retrograde massage and daily sensory stimulation to decrease sensitivity.  Pt will benefit from skilled OT services in the outpatient and aquatic settings to work on impairments and help pt return to higher level of functioning as able.    PERFORMANCE DEFICITS: in functional skills including dexterity, proprioception, sensation, tone, ROM, strength, pain, muscle spasms, Fine motor control, Gross motor control, endurance, and UE functional use  IMPAIRMENTS: are limiting patient from ADLs, IADLs, rest and sleep, work, and leisure.   CO-MORBIDITIES: may have co-morbidities  that affects  occupational performance. Patient will benefit from skilled OT to address above impairments and improve overall function.  REHAB POTENTIAL: Good   PLAN:  OT FREQUENCY: 2x/week  OT DURATION: 12 weeks  PLANNED INTERVENTIONS: self care/ADL training, therapeutic exercise, therapeutic activity, neuromuscular re-education, manual therapy, passive range of motion, functional mobility training, aquatic therapy, splinting, electrical stimulation, ultrasound, paraffin, fluidotherapy, moist heat, cryotherapy, contrast bath, patient/family education, and DME and/or AE instructions  RECOMMENDED OTHER SERVICES: may have potential for counseling - discussed with patient - he feels he is ok now, but will keep it in mind  CONSULTED AND AGREED WITH PLAN OF CARE: Patient  PLAN FOR NEXT SESSION: Aquatic therapy to address gentle motion / gentle resistance with less influence of gravity.   A/ROM as tolerated RUE.  Further desensitization activities/education Edema management   [name] Salina, OT 05/24/2023, 5:37 PM

## 2023-05-26 ENCOUNTER — Other Ambulatory Visit: Payer: Self-pay | Admitting: Student in an Organized Health Care Education/Training Program

## 2023-05-26 DIAGNOSIS — M792 Neuralgia and neuritis, unspecified: Secondary | ICD-10-CM

## 2023-05-26 DIAGNOSIS — G894 Chronic pain syndrome: Secondary | ICD-10-CM

## 2023-05-26 DIAGNOSIS — G540 Brachial plexus disorders: Secondary | ICD-10-CM

## 2023-05-26 DIAGNOSIS — G90511 Complex regional pain syndrome I of right upper limb: Secondary | ICD-10-CM

## 2023-05-27 ENCOUNTER — Encounter: Payer: Self-pay | Admitting: Occupational Therapy

## 2023-05-27 ENCOUNTER — Ambulatory Visit: Payer: BC Managed Care – PPO | Admitting: Occupational Therapy

## 2023-05-27 DIAGNOSIS — R278 Other lack of coordination: Secondary | ICD-10-CM | POA: Diagnosis not present

## 2023-05-27 DIAGNOSIS — M79601 Pain in right arm: Secondary | ICD-10-CM | POA: Diagnosis not present

## 2023-05-27 DIAGNOSIS — M79602 Pain in left arm: Secondary | ICD-10-CM | POA: Diagnosis not present

## 2023-05-27 DIAGNOSIS — M25641 Stiffness of right hand, not elsewhere classified: Secondary | ICD-10-CM | POA: Diagnosis not present

## 2023-05-27 DIAGNOSIS — R609 Edema, unspecified: Secondary | ICD-10-CM | POA: Diagnosis not present

## 2023-05-27 NOTE — Therapy (Signed)
OUTPATIENT OCCUPATIONAL THERAPY NEURO TREATMENT  Patient Name: Brian [name] MRN: [id number] DOB:1960-09-06, 62 y.o., male Today's Date: 05/27/2023  PCP: Keturah Barre REFERRING PROVIDER: Jacquelyne Balint  END OF SESSION:  OT End of Session - 05/27/23 0804     Visit Number 13    Number of Visits 25    Date for OT Re-Evaluation 06/28/23    Authorization Type BCBS  $1600 deductible has been met  20% coinsurance applies  $8500 oop, zero applied  VL combined with pt, ot, st: 60    OT Start Time 0800    OT Stop Time 0845    OT Time Calculation (min) 45 min    Equipment Utilized During Treatment water resistance equipment    Activity Tolerance Patient tolerated treatment well    Behavior During Therapy WFL for tasks assessed/performed             Past Medical History:  Diagnosis Date   COPD (chronic obstructive pulmonary disease) (HCC)    Diabetes mellitus without complication (HCC)    Hypertension    nscl ca dx'd 08/2018   Pneumonia    Subarachnoid hemorrhage (HCC)    Past Surgical History:  Procedure Laterality Date   INGUINAL HERNIA REPAIR Right 06/24/2020   Procedure: RIGHT OPEN INGUINAL HERNIA REPAIR WITH MESH;  Surgeon: Kinsinger, De Blanch, MD;  Location: [facility];  Service: General;  Laterality: Right;   IR IVC FILTER PLMT / S&I Lenise Arena GUID/MOD SED  03/04/2020   IR IVC FILTER RETRIEVAL / S&I Lenise Arena GUID/MOD SED  08/15/2020   IR RADIOLOGIST EVAL & MGMT  08/06/2020   LEG SURGERY  age 10    left leg, from fracture   Patient Active Problem List   Diagnosis Date Noted   Complex regional pain syndrome type 1 of right upper extremity 05/04/2023   Right arm pain 05/04/2023   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   Recurrent right pleural effusion 07/23/2021   Hypothyroidism 10/24/2020   Acute hypoxemic respiratory failure (HCC) 05/19/2020   Acute respiratory failure with hypoxia (HCC) 05/18/2020   Essential hypertension 05/18/2020   Type 2 diabetes mellitus with  hyperlipidemia (HCC) 05/18/2020   S/P thoracentesis 03/21/2020   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/05/2020   Calf swelling 02/21/2020   Rash 02/07/2020   Adenocarcinoma of right lung, stage 4 (HCC) 02/07/2020   Chronic cough 12/20/2019   Venous thrombosis 09/21/2019   Shortness of breath 09/07/2019   Headache 08/09/2019   Lobar pneumonia, unspecified organism (HCC) 03/07/2019   Centrilobular emphysema (HCC) 03/07/2019   Encounter for antineoplastic immunotherapy 11/10/2018   Adenocarcinoma of right lung, stage 3 (HCC) 08/25/2018   Goals of care, counseling/discussion 08/25/2018   Encounter for antineoplastic chemotherapy 08/25/2018   Mass of upper lobe of right lung 08/04/2018   Subarachnoid hemorrhage (HCC) 05/22/2015    ONSET DATE: 03/17/23 Referral  REFERRING DIAG: UE Pain and paresthesias  THERAPY DIAG:  Pain in right arm  Stiffness of right hand, not elsewhere classified  Other lack of coordination  Pain in left arm  Rationale for Evaluation and Treatment: Rehabilitation  SUBJECTIVE:   SUBJECTIVE STATEMENT:   The shoulder pain is worse. Then pins and needles are in the entire arm and that is constant  Pt accompanied by: self  PERTINENT HISTORY: Lung cancer w/ radiation, SAH 2016. Pt reports developing symptoms RUE this February and worsening symptoms in Rt hand more recently - MD thinks this is due to radiation approx 4 years ago  PRECAUTIONS: None  WEIGHT BEARING RESTRICTIONS: No  PAIN:  Are you having pain? Yes: NPRS scale: 5/10 Pain location: right arm/shoulder - mostly shoulder but also into hand and also reports it is starting to get tingling on L side as well into his hand Pain description: sharp, stabbing, throbbing, aching Aggravating factors: idle - positional Relieving factors: heat, ice, gabapentin  FALLS: Has patient fallen in last 6 months? Yes. Number of falls 1- fell last night  LIVING ENVIRONMENT: Lives with: lives with  their family and lives with their spouse Lives in: House/apartment Stairs: Yes: External: 2 steps; none Has following equipment at home:  has built in shower seat  PLOF: Independent with basic ADLs  PATIENT GOALS: to be able to get back to some level of use of my right hand  OBJECTIVE:   HAND DOMINANCE: Right  ADLs: Overall ADLs: min assist and increased time Transfers/ambulation related to ADLs: Eating: min assist - wife assists with cutting.  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 driving - uses LUE only at this point Handwriting: unable   POSTURE COMMENTS:  forward head and posterior pelvic tilt   ACTIVITY TOLERANCE: Activity tolerance: reports more fatigue due to constant pain   UPPER EXTREMITY ROM:    Active ROM Right eval Left eval  Shoulder flexion 115 unable to maintain ext elbow 140  Shoulder abduction    Shoulder adduction    Shoulder extension    Shoulder internal rotation    Shoulder external rotation    Elbow flexion WFL   Elbow extension WFL   Wrist flexion    Wrist extension 30   Wrist ulnar deviation 10   Wrist radial deviation 5   Wrist pronation    Wrist supination    (Blank rows = not tested)  UPPER EXTREMITY MMT:     MMT Right eval Left eval  Shoulder flexion NT 4-/5  Shoulder abduction    Shoulder adduction    Shoulder extension    Shoulder internal rotation    Shoulder external rotation    Middle trapezius    Lower trapezius    Elbow flexion    Elbow extension    Wrist flexion 3   Wrist extension 3   Wrist ulnar deviation    Wrist radial deviation    Wrist pronation     Wrist supination    (Blank rows = not tested) RIGHT HAND - 15% COMPOSITE FLEXION     - 40% COMPOSITE EXTENSION HAND FUNCTION: Grip strength: Right: UNABLE lbs; Left: 31.9 lbs  COORDINATION: Finger Nose Finger test: Mild undershooting Left - right lacks strength/ proximal control to complete  SENSATION: WFL to testing, but reports heightened sensation - hypersensitive  EDEMA: Atrophy RUE - UPPER ARM, FOREARM  MUSCLE TONE: RUE: Moderate and Hypotonic and LUE: Within functional limits  COGNITION: Overall cognitive status: Within functional limits for tasks assessed.  Reports some concentration deficits which he attributes to pain  VISION: Subjective report: no changes Baseline vision: Wears glasses all the time Visual history:  nothing significant  VISION ASSESSMENT: Not tested  Patient has difficulty with following activities due to following visual impairments: NA  PERCEPTION: WFL  PRAXIS: WFL   TODAY'S TREATMENT:  DATE: 05/27/23  Table slides BUE's gravity elim plane AA/ROM: in shoulder flexion, horizontal abd/add, and circumduction. Progressed to then RUE only for scaption and elbow flex and ext sitting to side of table  Seated at mat: wt bearing over RUE w/ body on arm movements during bilateral trunk rotation. Wt bearing over BUE's w/ sit to squat position, progressed to wt shifts laterally while in squat position - therapist holding Rt hand to keep from slipping.   Supine: chest press with min assist from therapist for RUE and hand placement x 5 reps, 2 sets. 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 Yes  SHORT TERM GOALS: Target date: 04/30/23  Patient will complete HEP designed to improve Bilateral shoulder range of motion  Goal status:MET  2.  Patient will complete HEP designed to improve R elbow flex/ext strength  Goal status: MET  3.  Patient will complete HEP designed to improve range of motion for composite flexion and extension in right hand/wrist  Goal status: MET  4.  Patient will demonstrate ability to pick up and retrieve lightweight object from table with RUE - e.g. cell phone  Goal status: IN PROGRESS  5.  Patient will report improved awareness of positional changes/ support techniques to reduce UE pain.    Goal status: MET   6.  Patient will demonstrate 50% composite active flex/ext in right hand  Goal status: IN PROGRESS   LONG TERM GOALS: Target date: 06/28/23  Patient will complete updated HEP designed on UE strengthening without increase in pain  Goal status: IN PROGRESS  2.  Patient will demonstrate 10 lb grip strength R hand  Goal status: IN PROGRESS  3.  Patient will demonstrate low reach RUE to obtain and release a 2 lb object x 5 without dropping  Goal status: IN PROGRESS  4.  Patient will be able to cut his own food with modified independence  Goal status: IN PROGRESS  5.  Patient will be independent with aquatic exercise program addressing BUE active movement and strength  Goal status: IN PROGRESS  6.  Patient will dress himself with modified independence  Goal status: IN PROGRESS  ASSESSMENT:  CLINICAL IMPRESSION: Patient is seen today for occupational therapy treatment for  RUE swelling, weakness, pain and sensory changes. Patient currently presents well below baseline level of function with his dominant RUE and reported some changes on L side too.  Pt has met 4 STG's at this time. Pt will benefit from skilled OT services in the outpatient and aquatic settings to work on impairments and help pt return to higher level of functioning as able.    PERFORMANCE DEFICITS: in functional skills including dexterity, proprioception, sensation, tone, ROM, strength, pain, muscle spasms, Fine motor control, Gross motor control, endurance, and UE functional use  IMPAIRMENTS: are limiting patient from ADLs, IADLs, rest and sleep, work, and leisure.   CO-MORBIDITIES: may have co-morbidities  that affects occupational performance. Patient will benefit from skilled OT to address above impairments and  improve overall function.  REHAB POTENTIAL: Good   PLAN:  OT FREQUENCY: 2x/week  OT DURATION: 12 weeks  PLANNED INTERVENTIONS: self care/ADL training, therapeutic exercise, therapeutic activity, neuromuscular re-education, manual therapy, passive range of motion, functional mobility training, aquatic therapy, splinting, electrical stimulation, ultrasound, paraffin, fluidotherapy, moist heat, cryotherapy, contrast bath, patient/family education, and DME and/or AE instructions  RECOMMENDED OTHER SERVICES: may have potential for counseling - discussed with patient - he feels he is ok now, but will keep it in mind  CONSULTED AND AGREED WITH PLAN OF CARE: Patient  PLAN FOR NEXT SESSION: Aquatic therapy to address gentle motion / gentle resistance with less influence of gravity.   Gentle wt bearing over UE's in standing w/ modifications to wrist/hand prn  Sheran Lawless, OT 05/27/2023, 8:05 AM

## 2023-05-30 ENCOUNTER — Encounter: Payer: Self-pay | Admitting: Student in an Organized Health Care Education/Training Program

## 2023-05-31 ENCOUNTER — Encounter: Payer: Self-pay | Admitting: Occupational Therapy

## 2023-05-31 ENCOUNTER — Other Ambulatory Visit: Payer: Self-pay | Admitting: Physician Assistant

## 2023-05-31 ENCOUNTER — Ambulatory Visit: Payer: BC Managed Care – PPO | Admitting: Occupational Therapy

## 2023-05-31 DIAGNOSIS — C3491 Malignant neoplasm of unspecified part of right bronchus or lung: Secondary | ICD-10-CM

## 2023-05-31 DIAGNOSIS — M25641 Stiffness of right hand, not elsewhere classified: Secondary | ICD-10-CM | POA: Diagnosis not present

## 2023-05-31 DIAGNOSIS — M79601 Pain in right arm: Secondary | ICD-10-CM

## 2023-05-31 DIAGNOSIS — M79602 Pain in left arm: Secondary | ICD-10-CM

## 2023-05-31 DIAGNOSIS — R278 Other lack of coordination: Secondary | ICD-10-CM | POA: Diagnosis not present

## 2023-05-31 DIAGNOSIS — R609 Edema, unspecified: Secondary | ICD-10-CM | POA: Diagnosis not present

## 2023-05-31 NOTE — Therapy (Signed)
OUTPATIENT OCCUPATIONAL THERAPY NEURO TREATMENT  Patient Name: Brian [name] MRN: [id number] DOB:August 11, 1960, 62 y.o., male Today's Date: 05/31/2023  PCP: Keturah Barre REFERRING PROVIDER: Jacquelyne Balint  END OF SESSION:  OT End of Session - 05/31/23 1835     Visit Number 14    Number of Visits 25    Date for OT Re-Evaluation 06/28/23    Authorization Type [employer]  $1600 deductible has been met  20% coinsurance applies  $8500 oop, zero applied  VL combined with pt, ot, st: 60    OT Start Time 1545    OT Stop Time 1620    OT Time Calculation (min) 35 min    Equipment Utilized During Treatment water floatation equipment    Activity Tolerance Patient tolerated treatment well    Behavior During Therapy WFL for tasks assessed/performed             Past Medical History:  Diagnosis Date   COPD (chronic obstructive pulmonary disease) (HCC)    Diabetes mellitus without complication (HCC)    Hypertension    nscl ca dx'd 08/2018   Pneumonia    Subarachnoid hemorrhage (HCC)    Past Surgical History:  Procedure Laterality Date   INGUINAL HERNIA REPAIR Right 06/24/2020   Procedure: RIGHT OPEN INGUINAL HERNIA REPAIR WITH MESH;  Surgeon: Kinsinger, De Blanch, MD;  Location: [facility];  Service: General;  Laterality: Right;   IR IVC FILTER PLMT / S&I Lenise Arena GUID/MOD SED  03/04/2020   IR IVC FILTER RETRIEVAL / S&I Lenise Arena GUID/MOD SED  08/15/2020   IR RADIOLOGIST EVAL & MGMT  08/06/2020   LEG SURGERY  age 71    left leg, from fracture   Patient Active Problem List   Diagnosis Date Noted   Complex regional pain syndrome type 1 of right upper extremity 05/04/2023   Right arm pain 05/04/2023   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   Recurrent right pleural effusion 07/23/2021   Hypothyroidism 10/24/2020   Acute hypoxemic respiratory failure (HCC) 05/19/2020   Acute respiratory failure with hypoxia (HCC) 05/18/2020   Essential hypertension 05/18/2020   Type 2 diabetes mellitus with  hyperlipidemia (HCC) 05/18/2020   S/P thoracentesis 03/21/2020   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/05/2020   Calf swelling 02/21/2020   Rash 02/07/2020   Adenocarcinoma of right lung, stage 4 (HCC) 02/07/2020   Chronic cough 12/20/2019   Venous thrombosis 09/21/2019   Shortness of breath 09/07/2019   Headache 08/09/2019   Lobar pneumonia, unspecified organism (HCC) 03/07/2019   Centrilobular emphysema (HCC) 03/07/2019   Encounter for antineoplastic immunotherapy 11/10/2018   Adenocarcinoma of right lung, stage 3 (HCC) 08/25/2018   Goals of care, counseling/discussion 08/25/2018   Encounter for antineoplastic chemotherapy 08/25/2018   Mass of upper lobe of right lung 08/04/2018   Subarachnoid hemorrhage (HCC) 05/22/2015    ONSET DATE: 03/17/23 Referral  REFERRING DIAG: UE Pain and paresthesias  THERAPY DIAG:  Pain in right arm  Stiffness of right hand, not elsewhere classified  Other lack of coordination  Pain in left arm  Rationale for Evaluation and Treatment: Rehabilitation  SUBJECTIVE:   SUBJECTIVE STATEMENT:   The shoulder pain is worse. Then pins and needles are in the entire arm and that is constant  Pt accompanied by: self  PERTINENT HISTORY: Lung cancer w/ radiation, [facility] 2016. Pt reports developing symptoms RUE this February and worsening symptoms in Rt hand more recently - MD thinks this is due to radiation approx 4 years ago  PRECAUTIONS: None  WEIGHT BEARING RESTRICTIONS: No  PAIN:  Are you having pain? Yes: NPRS scale: 5/10 Pain location: right arm/shoulder - mostly shoulder but also into hand and also reports it is starting to get tingling on L side as well into his hand Pain description: sharp, stabbing, throbbing, aching Aggravating factors: idle - positional Relieving factors: heat, ice, gabapentin  FALLS: Has patient fallen in last 6 months? Yes. Number of falls 1- fell last night  LIVING ENVIRONMENT: Lives with: lives with  their family and lives with their spouse Lives in: House/apartment Stairs: Yes: External: 2 steps; none Has following equipment at home:  has built in shower seat  PLOF: Independent with basic ADLs  PATIENT GOALS: to be able to get back to some level of use of my right hand  OBJECTIVE:   HAND DOMINANCE: Right  ADLs: Overall ADLs: min assist and increased time Transfers/ambulation related to ADLs: Eating: min assist - wife assists with cutting.  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 driving - uses LUE only at this point Handwriting: unable   POSTURE COMMENTS:  forward head and posterior pelvic tilt   ACTIVITY TOLERANCE: Activity tolerance: reports more fatigue due to constant pain   UPPER EXTREMITY ROM:    Active ROM Right eval Left eval  Shoulder flexion 115 unable to maintain ext elbow 140  Shoulder abduction    Shoulder adduction    Shoulder extension    Shoulder internal rotation    Shoulder external rotation    Elbow flexion WFL   Elbow extension WFL   Wrist flexion    Wrist extension 30   Wrist ulnar deviation 10   Wrist radial deviation 5   Wrist pronation    Wrist supination    (Blank rows = not tested)  UPPER EXTREMITY MMT:     MMT Right eval Left eval  Shoulder flexion NT 4-/5  Shoulder abduction    Shoulder adduction    Shoulder extension    Shoulder internal rotation    Shoulder external rotation    Middle trapezius    Lower trapezius    Elbow flexion    Elbow extension    Wrist flexion 3   Wrist extension 3   Wrist ulnar deviation    Wrist radial deviation    Wrist pronation     Wrist supination    (Blank rows = not tested) RIGHT HAND - 15% COMPOSITE FLEXION     - 40% COMPOSITE EXTENSION HAND FUNCTION: Grip strength: Right: UNABLE lbs; Left: 31.9 lbs  COORDINATION: Finger Nose Finger test: Mild undershooting Left - right lacks strength/ proximal control to complete  SENSATION: WFL to testing, but reports heightened sensation - hypersensitive  EDEMA: Atrophy RUE - UPPER ARM, FOREARM  MUSCLE TONE: RUE: Moderate and Hypotonic and LUE: Within functional limits  COGNITION: Overall cognitive status: Within functional limits for tasks assessed.  Reports some concentration deficits which he attributes to pain  VISION: Subjective report: no changes Baseline vision: Wears glasses all the time Visual history:  nothing significant  VISION ASSESSMENT: Not tested  Patient has difficulty with following activities due to following visual impairments: NA  PERCEPTION: WFL  PRAXIS: WFL   TODAY'S TREATMENT:  DATE:  05/31/23 Patient seen for aquatic therapy visit.  Entered and exited the pool independently.  Pain 2/10 at start of session.   Worked on improving range of motion in BUE - more targeted to Right shoulder and elbow.  Supine floatation to address body on arm movement as well as arm/trunk/ head interaction.  Patient with significantly improved GH IR/ER and abduction this session.  Maintaining elbow in extension with less pain than prior sessions.  Patient able to swim in supine - arm stroke of elementary backstroke x 5 reps.   05/27/23  Table slides BUE's gravity elim plane AA/ROM: in shoulder flexion, horizontal abd/add, and circumduction. Progressed to then RUE only for scaption and elbow flex and ext sitting to side of table  Seated at mat: wt bearing over RUE w/ body on arm movements during bilateral trunk rotation. Wt bearing over  BUE's w/ sit to squat position, progressed to wt shifts laterally while in squat position - therapist holding Rt hand to keep from slipping.   Supine: chest press with min assist from therapist for RUE and hand placement x 5 reps, 2 sets. P/ROM to hand in full composite flex, MP flexion, IP flexion between sets  NMES x 10 min for finger flexion (trace finger flexion) with tenodesis at wrist, Int = 45 (previous small muscle parameters)     PATIENT EDUCATION: Education details: Futures trader Person educated: Patient Education method: Explanation, Demonstration, and Verbal cues Education comprehension: verbalized understanding, returned demonstration, and verbal cues required  HOME EXERCISE PROGRAM: 04/14/23: bed positioning, AA/ROM HEP for RUE 04/22/23: passive stretching HEP for Rt hand, edema management strategies   GOALS: Goals reviewed with patient? Yes  SHORT TERM GOALS: Target date: 04/30/23  Patient will complete HEP designed to improve Bilateral shoulder range of motion  Goal status:MET  2.  Patient will complete HEP designed to improve R elbow flex/ext strength  Goal status: MET  3.  Patient will complete HEP designed to improve range of motion for composite flexion and extension in right hand/wrist  Goal status: MET  4.  Patient will demonstrate ability to pick up and retrieve lightweight object from table with RUE - e.g. cell phone  Goal status: IN PROGRESS  5.  Patient will report improved awareness of positional changes/ support techniques to reduce UE pain.    Goal status: MET   6.  Patient will demonstrate 50% composite active flex/ext in right hand  Goal status: IN PROGRESS   LONG TERM GOALS: Target date: 06/28/23  Patient will complete updated HEP designed on UE strengthening without increase in pain  Goal status: IN PROGRESS  2.  Patient will demonstrate 10 lb grip strength R hand  Goal status: IN PROGRESS  3.  Patient will demonstrate  low reach RUE to obtain and release a 2 lb object x 5 without dropping  Goal status: IN PROGRESS  4.  Patient will be able to cut his own food with modified independence  Goal status: IN PROGRESS  5.  Patient will be independent with aquatic exercise program addressing BUE active movement and strength  Goal status: IN PROGRESS  6.  Patient will dress himself with modified independence  Goal status: IN PROGRESS  ASSESSMENT:  CLINICAL IMPRESSION: Patient is seen today for occupational therapy treatment for RUE swelling, weakness, pain and sensory changes. Patient currently presents well below baseline level of function with his dominant RUE and reported some changes on L side too.  Pt has met 4 STG's at  this time. Pt will benefit from skilled OT services in the outpatient and aquatic settings to work on impairments and help pt return to higher level of functioning as able.    PERFORMANCE DEFICITS: in functional skills including dexterity, proprioception, sensation, tone, ROM, strength, pain, muscle spasms, Fine motor control, Gross motor control, endurance, and UE functional use  IMPAIRMENTS: are limiting patient from ADLs, IADLs, rest and sleep, work, and leisure.   CO-MORBIDITIES: may have co-morbidities  that affects occupational performance. Patient will benefit from skilled OT to address above impairments and improve overall function.  REHAB POTENTIAL: Good   PLAN:  OT FREQUENCY: 2x/week  OT DURATION: 12 weeks  PLANNED INTERVENTIONS: self care/ADL training, therapeutic exercise, therapeutic activity, neuromuscular re-education, manual therapy, passive range of motion, functional mobility training, aquatic therapy, splinting, electrical stimulation, ultrasound, paraffin, fluidotherapy, moist heat, cryotherapy, contrast bath, patient/family education, and DME and/or AE instructions  RECOMMENDED OTHER SERVICES: may have potential for counseling - discussed with patient - he  feels he is ok now, but will keep it in mind  CONSULTED AND AGREED WITH PLAN OF CARE: Patient  PLAN FOR NEXT SESSION: Aquatic therapy to address gentle motion / gentle resistance with less influence of gravity.   Gentle wt bearing over UE's in standing w/ modifications to wrist/hand prn  [name] Salina, OT 05/31/2023, 6:36 PM

## 2023-06-03 ENCOUNTER — Encounter: Payer: BC Managed Care – PPO | Admitting: Occupational Therapy

## 2023-06-07 NOTE — Progress Notes (Deleted)
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  HPI: Brian [name] is a 62 y.o. year old right-handed male with a medical history of diabetes, hypothyroidism, lung cancer s/p chemo and radiation, emphysema, SAH, and HLD who we last saw on 04/21/23.  To briefly review: 03/17/23: Patient has pain starting at the base of the neck that radiates mostly into the right arm. He has no use in his 4th and 5th digits. He has a lot of pain in the neck, shoulder, into the elbow, and into the arm. The pain is very severe and worse at night. In the left arm patient has some numbness and pain, most in the forearm. He feels like it is a bad sunburn. He has muscle atrophy and is getting progressively worse. Patient thinks symptoms started in 06/2022 and were mild. Symptoms became severe in 09/2022. The pain is so bad currently that he sleeps only a couple hours per night.   He denies similar symptoms in the past.   He endorses muscle cramps in right triceps mainly. He endorses some fasciculations in his arms. He denies significant low back pain or symptoms in his legs.   The patient denies symptoms suggestive of oculobulbar weakness including diplopia, ptosis, dysphagia, poor saliva control, dysarthria/dysphonia, impaired mastication, facial weakness/droop.   There are no neuromuscular respiratory weakness symptoms, particularly orthopnea>dyspnea.    Pseudobulbar affect is absent.   He does Brian report any constitutional symptoms like fever, night sweats, anorexia or unintentional weight loss. He does report 10 lbs of weight loss but this is intentional.   EtOH use: 2-3 beers a month  Restrictive diet? No Family history of neuropathy/myopathy/neurologic disease? No   Patient has seen spine and maybe saw neurology in [facility]. He has also been seen at the hand center. He saw PM&R at [facility] and had EMG on 02/12/23 and  02/17/23 that showed right brachial plexopathy (per notes: upper trunk brachial pan plexopathy in the right upper extremity predominantly in the lower trunk distribution). Patient states that he was told he had carpal tunnel and ulnar neuropathy, which is what the LUE report mentioned.   Patient has been prescribed gabapentin 1200 mg at night (sometimes will take 1800 mg). He is Brian sure it is working. He denies any side effects. He also has baclofen 10 mg TID, but does Brian really take this. His pain is so bad though, he does Brian have good relief. He also uses ice and heat. He also had a steroid shot and Toradol that only helped about 2 hours.   Oncology history per Dr. Asa Lente oncology clinic note (12/07/22):   Metastatic non-small cell lung cancer initially diagnosed as stage IIIB (T1c, N3, M0) non-small cell lung cancer, adenocarcinoma presented with right upper lobe lung mass in addition to mediastinal and bilateral supraclavicular lymphadenopathy diagnosed in January 2020.  He had evidence of disease recurrence in June 2021 with adenopathy in the subcarinal, level 3 left neck lymph node, and lymph node at the curious of the diaphragm and in the upper abdomen.    PRIOR THERAPY: 1) Concurrent chemoradiation with weekly carboplatin for AUC of 2 and paclitaxel 45 mg/M2.  Status post 6 cycles.  Last dose was given on October 10, 2018 with stable disease. 2) Radiation treatment to the enlarging right cervical lymph nodes under the care of Dr. Roselind Messier. First treatment 03/06/2019. Last treatment scheduled on 04/13/2019 3) Consolidation  treatment with immunotherapy with Imfinzi 10 mg/KG every 2 weeks.  First dose November 17, 2018.  Status post 26 cycles. 4) Systemic chemotherapy with carboplatin for an AUC of 5, Alimta 500 mg/m2, and Keytruda 200 mg IV every 3 weeks. First dose expected on 02/14/20. Status post 1 cycle.  This treatment was discontinued after the patient was [name] to have ALK gene translocation on the  molecular studies by foundation 1. 5) SBRT to the left lower lobe lung nodule.   CURRENT THERAPY:  [name] ([name]) 600 mg p.o. twice daily.  He started the first dose on March 08, 2020.   Status post 33 months of treatment.   Per patient, he is currently in remission.  04/21/23: EMG on 03/30/23 did show evidence of bilateral brachial plexopathy with myokymia as would be seen in radiation plexopathy. Patient continued to have significant pain, especially at night. I increased his gabapentin to 600 mg midday and 2400 mg in the evening while continuing Cymbalta 60 mg daily (03/30/23).   Patient's pain has progressively gotten worse. He has a lot of swelling in his hand and feels like it is going to burst. He tried to elevated it last night. The swelling reduced when he did this. He thinks movement and use of arm are still bad but are unchanged. Patient is no longer taking the gabapentin 600 mg during the day, as it did not do much. He continues to take gabapentin 2400 mg in the evening and Cymbalta 60 mg daily.    MRI of brachial plexus on 04/02/23 which was normal of bilateral brachial plexus. Mild teninosis of right supraspinatus tendon was mentioned.   Patient saw Dr. Katrinka Blazing in NSGY on 04/07/23. Patient was referred for scalene block and evaluation of peripheral nerve stimulator. He will be evaluated on 05/04/23.   Patient has started therapy (last week). He has this scheduled through 07/2023.  Most recent Assessment and Plan (04/21/23): This is Brian [name], a 62 y.o. male with bilateral upper extremity pain and weakness. EMG was consistent with a diffuse right brachial plexopathy with myokymic discharges as can be seen in radiation plexopathy (seen in right FPL, biceps, and infraspinatus). His LUE showed abnormal median, ulnar, and radial sensory responses with chronic neurogenic changes seen in the left FDI, FPL (with active changes), and APB, suggesting a possible lower trunk predominant  process. The etiology of symptoms of the left side is less clear though.   Patient continues to have significant pain despite maximum dosages of gabapentin and Cymbalta. We discussed switching to Lyrica, but likely pain management he is seeing on 05/04/23 would have more to offer, including potential nerve block and/or nerve stimulator.    Plan: -Patient would prefer to continue current medications for now: gabapentin 2400 mg at bedtime and Cymbalta 60 mg daily. -See pain management as planned on 05/04/23 -May follow up with Dr. Katrinka Blazing in NSGY as well  Since their last visit: Patient saw Dr. Cherylann Ratel in pain management on 05/04/23. In addition to radiation plexopathy, Dr. Cherylann Ratel also diagnosed complex regional pain syndrome. He recommended decreasing gabapentin to 1200 mg nightly and starting amitriptyline 50 mg at bedtime. He also continued Cymbalta, warning of signs of serotonin syndrome. A diagnostic scalene block was done on 05/12/23. He followed up again on 06/09/23. There are pains for right interscalene block and possible stellate ganglion block.   MEDICATIONS:  Outpatient Encounter Medications as of 06/17/2023  Medication Sig   albuterol (VENTOLIN HFA) 108 (90 Base) MCG/ACT inhaler  INHALE 2 PUFFS INTO THE LUNGS EVERY 4 (FOUR) HOURS AS NEEDED FOR WHEEZING OR SHORTNESS OF BREATH.   [name] 150 MG capsule TAKE 4 CAPSULES BY MOUTH TWICE DAILY WITH FOOD. SWALLOW CAPSULES WHOLE. DO Brian OPEN OR CRUSH. STORE IN ORIGINAL CONTAINER   amitriptyline (ELAVIL) 25 MG tablet Take 3 tablets (75 mg total) by mouth at bedtime.   amLODipine (NORVASC) 2.5 MG tablet Take 2.5 mg by mouth daily.   atorvastatin (LIPITOR) 20 MG tablet Take 20 mg by mouth daily.   baclofen (LIORESAL) 10 MG tablet Take 10 mg by mouth daily.   DULoxetine (CYMBALTA) 60 MG capsule Take 1 capsule (60 mg total) by mouth daily.   gabapentin (NEURONTIN) 600 MG tablet Take 1 tablet (600 mg) midday and take 4 tablets (2400 mg) at bedtime  (Patient taking differently: Take 1,200 mg by mouth at bedtime. Take 1 tablet (600 mg) midday and take 4 tablets (2400 mg) at bedtime)   naproxen sodium (ALEVE) 220 MG tablet Take 220 mg by mouth.   TRELEGY ELLIPTA 100-62.5-25 MCG/INH AEPB Inhale 1 puff into the lungs daily.   [DISCONTINUED] amitriptyline (ELAVIL) 25 MG tablet Take 1 tablet (25 mg total) by mouth at bedtime for 15 days, THEN 2 tablets (50 mg total) at bedtime.   No facility-administered encounter medications on file as of 06/17/2023.    PAST MEDICAL HISTORY: Past Medical History:  Diagnosis Date   COPD (chronic obstructive pulmonary disease) (HCC)    Diabetes mellitus without complication (HCC)    Hypertension    nscl ca dx'd 08/2018   Pneumonia    Subarachnoid hemorrhage (HCC)     PAST SURGICAL HISTORY: Past Surgical History:  Procedure Laterality Date   INGUINAL HERNIA REPAIR Right 06/24/2020   Procedure: RIGHT OPEN INGUINAL HERNIA REPAIR WITH MESH;  Surgeon: Kinsinger, De Blanch, MD;  Location: [facility];  Service: General;  Laterality: Right;   IR IVC FILTER PLMT / S&I Lenise Arena GUID/MOD SED  03/04/2020   IR IVC FILTER RETRIEVAL / S&I Lenise Arena GUID/MOD SED  08/15/2020   IR RADIOLOGIST EVAL & MGMT  08/06/2020   LEG SURGERY  age 35    left leg, from fracture    ALLERGIES: No Known Allergies  FAMILY HISTORY: Family History  Problem Relation Age of Onset   Diabetes Mother    Diabetes Brother    Colon cancer Neg Hx    Stomach cancer Neg Hx    Thyroid cancer Neg Hx     SOCIAL HISTORY: Social History   Tobacco Use   Smoking status: Never   Smokeless tobacco: Never  Vaping Use   Vaping status: Never Used  Substance Use Topics   Alcohol use: Yes    Alcohol/week: 5.0 standard drinks of alcohol    Types: 5 Standard drinks or equivalent per week    Comment: occasionally   Drug use: No   Social History   Social History Narrative   Are you right handed or left handed? Right   Are you currently employed ?    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   Who lives with you? wife   What type of home do you live in: 1 story or 2 story? Two    Caffeine  3 cups a day     Objective:  Vital Signs:  BP 117/81   Pulse 93   Ht 5\' 8"  (1.727 m)   Wt 188 lb (85.3 kg)   SpO2  97%   BMI 28.59 kg/m   General: General appearance: Awake and alert. No distress. Cooperative with exam.  Skin: No obvious rash or jaundice. HEENT: Atraumatic. Anicteric. Lungs: Non-labored breathing on room air  Heart: Regular Abdomen: Soft, non tender. Extremities: No edema. No obvious deformity.  Musculoskeletal: No obvious joint swelling.  Neurological: Mental Status: Alert. Speech fluent. No pseudobulbar affect Cranial Nerves: CNII: No RAPD. Visual fields intact. CNIII, IV, VI: PERRL. No nystagmus. EOMI. CN V: Facial sensation intact bilaterally to fine touch. [place] clench strong. Jaw jerk***. CN VII: Facial muscles symmetric and strong. No ptosis at rest or after sustained upgaze***. CN VIII: Hears finger rub well bilaterally. CN IX: No hypophonia. CN X: Palate elevates symmetrically. CN XI: Full strength shoulder shrug bilaterally. CN XII: Tongue protrusion full and midline. No atrophy or fasciculations. No significant dysarthria*** Motor: Tone is ***. *** fasciculations in *** extremities. *** atrophy. No grip or percussive myotonia.  Individual muscle group testing (MRC grade out of 5):  Movement     Neck flexion ***    Neck extension ***     Right Left   Shoulder abduction *** ***   Shoulder adduction *** ***   Shoulder ext rotation *** ***   Shoulder int rotation *** ***   Elbow flexion *** ***   Elbow extension *** ***   Wrist extension *** ***   Wrist flexion *** ***   Finger abduction - FDI *** ***   Finger abduction - ADM *** ***   Finger extension *** ***   Finger distal flexion - 2/3 *** ***   Finger distal flexion - 4/5 *** ***   Thumb flexion - FPL *** ***    Thumb abduction - APB *** ***    Hip flexion *** ***   Hip extension *** ***   Hip adduction *** ***   Hip abduction *** ***   Knee extension *** ***   Knee flexion *** ***   Dorsiflexion *** ***   Plantarflexion *** ***   Inversion *** ***   Eversion *** ***   Great toe extension *** ***   Great toe flexion *** ***     Reflexes:  Right Left  Bicep *** ***  Tricep *** ***  BrRad *** ***  Knee *** ***  Ankle *** ***   Pathological Reflexes: Babinski: *** response bilaterally*** Hoffman: *** Troemner: *** Pectoral: *** Palmomental: *** Facial: *** Midline tap: *** Sensation: Pinprick: *** Vibration: *** Temperature: *** Proprioception: *** Coordination: Intact finger-to- nose-finger and heel-to-shin bilaterally. Romberg negative.*** Gait: Able to rise from chair with arms crossed unassisted. Normal, narrow-based gait. Able to tandem walk. Able to walk on toes and heels.***   Lab and Test Review: No new results***  Previously reviewed results: B6 (03/17/23) wnl   TSH (11/06/22): elevated to 6.447   External labs: 01/27/23: CMP significant for mildly elevated glucose (112), Tbili (1.1), and Alk phos (124) TSH wnl HbA1c: 5.8 Lipid panel: total cholesterol 151, TG 75, LDL 62   B12 (10/20/21): 921   Imaging: External EMG (02/12/23 - cannot see table only this note): Esgardo Chatwin is a 62 y.o. year old male with BUE pain and paresthesias (R > L) referred for electrodiagnostics to evaluate for peripheral nerve entrapment versus brachial plexopathy versus cervical radiculopathy. Patient does have a history of lung cancer s/p chemotherapy and radiation to the right upper lung field and right neck region. He also has a history of T2DM. Due to the severity of  the findings, testing was limited to the right upper extremity today. He will come back next week for left upper extremity testing. Findings were consistent with below:  There is electrodiagnostic evidence of  an upper trunk brachial pan plexopathy in the right upper extremity predominantly in the lower trunk distribution. There is evidence of acute denervation in all right upper extremity muscles tested. This is consistent with the patient's clinical history of radiation. Due to the above plexopathy and diffuse findings in the right upper extremity on EMG, cannot rule out a more diffuse process such as motor neuron disease. Will bring patient back in to test for the left upper extremity next week. While the median nerve conduction studies were abnormal, this may be attributed to the more proximal plexopathy, but cannot definitively rule out a concomitant median neuropathy.    External EMG (02/17/23 - cannot see table only this note): Avary Goode is a 62 y.o. year old male referred for electrodiagnostics, findings consistent with: Acute on chronic left C8 radiculopathy. Severe left-sided carpal tunnel syndrome. Left-sided cubital tunnel syndrome.    MRI brain w/wo contrast (10/15/22): FINDINGS: Brain:   No age advanced or lobar predominant parenchymal atrophy.   Multifocal T2 FLAIR hyperintense signal abnormality within the cerebral white matter, nonspecific but compatible with mild chronic small vessel ischemic disease.   Small chronic infarct within the right cerebellar hemisphere   There is no acute infarct.   No evidence of an intracranial mass.   No chronic intracranial blood products.   No extra-axial fluid collection.   No midline shift.   No pathologic intracranial enhancement identified.   Vascular: Maintained flow voids within the proximal large arterial vessels.   Skull and upper cervical spine: No focal suspicious marrow lesion.   Sinuses/Orbits: No mass or acute finding within the imaged orbits. Partial T2 hyperintense opacification of the right frontal sinus and of an anterior right ethmoid air cell. Mild mucosal thickening scattered elsewhere within bilateral  ethmoid air cells. Mild mucosal thickening within the bilateral maxillary sinuses.   IMPRESSION: 1. No evidence of intracranial metastatic disease. 2. Mild chronic small vessel ischemic changes within the cerebral white matter, similar to the prior brain MRI of 10/09/2021. 3. 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 4. Paranasal sinus disease, as described   MRI right brachial plexus (07/30/22): FINDINGS: Spinal cord   Visualized spinal cord demonstrates normal signal.   Brachial plexus:   Appears normal. No mass impinging on the brachial plexus is identified. No neurogenic tumor is identified.   Muscles and tendons   Normal signal throughout without evidence of denervation atrophy.   Bones   No fracture or worrisome lesion is seen. Small degenerative cyst in the right humeral head is noted.   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 The glenohumeral joint appears normal.   Other findings   Moderately large right pleural effusion appears increased in size compared to the prior chest CT.   IMPRESSION: Normal appearing right brachial plexus.   Moderately large right pleural effusion appears increased in size compared to the patient's 03/30/2022 chest CT.   Acromioclavicular osteoarthritis.   MRI cervical spine wo contrast (07/30/22): FINDINGS: Alignment: Physiologic.   Vertebrae: No fracture, evidence of discitis, or bone lesion.   Cord: Normal signal and morphology.   Posterior Fossa, vertebral arteries, paraspinal tissues: Negative.   Disc levels:   C2-C3: No significant disc bulge. Mild facet arthropathy. No spinal canal stenosis or  neural foraminal narrowing.   C3-C4: No significant disc bulge. Left-greater-than-right facet arthropathy. No spinal canal stenosis or neural foraminal narrowing.   C4-C5: No significant disc bulge. Left-greater-than-right  facet arthropathy. No spinal canal stenosis or neural foraminal narrowing.   C5-C6: Minimal disc bulge with right paracentral and subarticular disc protrusion, which indents the thecal sac but does Brian deform the spinal cord. Facet and uncovertebral hypertrophy. No spinal canal stenosis. Mild right neural foraminal narrowing.   C6-C7: No significant disc bulge. No spinal canal stenosis or neuroforaminal narrowing.   C7-T1: No significant disc bulge. No spinal canal stenosis or neuroforaminal narrowing.   IMPRESSION: 1. C5-C6 mild right neural foraminal narrowing. 2. Multilevel facet arthropathy, which can be a cause of pain.  EMG (03/30/23): NCV & EMG Findings: Extensive electrodiagnostic evaluation of bilateral upper limbs shows: Bilateral median antebrachial cutaneous (MAC) and right lateral antebrachial cutaneous (LAC) sensory responses are absent. Bilateral median, bilateral ulnar, and bilateral radial sensory responses show reduced amplitudes (see table below). Left LAC is present. Bilateral median (APB) motor responses show reduced amplitudes (right 1.17, left 4.0 mV). Right ulnar (ADM) motor response shows reduced amplitude (4.0 mV). Left ulnar (ADM) motor response is within normal limits, but borderline normal (7.3 mV). Chronic motor axon loss changes WITH accompanying active denervation changes are seen in the bilateral flexor pollicis longus, right first dorsal interosseous, right extensor inidicis proprius, abductor pollicis brevis, and triceps muscles. Chronic motor axon loss changes WITHOUT active denervation changes are seen in right biceps, right deltoid, right infraspinatus, left first dorsal interosseous, left extensor indicis proprius, and left abductor pollicis brevis muscles.  Myokymic discharges are seen in the right flexor pollicis longus, right biceps, and right infraspinatus muscles.   Impression: This is an abnormal study. The findings are most consistent with the  following: Evidence of a diffuse right brachial plexopathy, though most prominent in the lower truck, severe in degree electrically. There are myokymic discharges in 3 muscles of the right upper limb, as may be seen in radiation plexopathy. 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 An overlapping left C8-T1 radiculopathy cannot be completely excluded, though normal cervical paraspinal evaluation makes #2 more likely.   MRI brachial plexus w/wo contrast (04/02/23): FINDINGS: Spinal cord   Normal caliber and signal of visualized spinal cord.   Brachial plexus   Roots: Normal.   Trunks: Normal.   Divisions: Normal.   Cords: Normal.   Branches: Normal.   Muscles and tendons   Muscles are normal. No muscle atrophy or muscle edema. Mild tendinosis of the right supraspinatus tendon. Remainder the rotator cuff tendons bilaterally are intact.   Bones   Cervical spine: Mild broad-based disc bulge at C5-6.   C7 transverse processes: Normal.   Marrow signal: Normal.   Other: Mild osteoarthritis of bilateral glenohumeral joints.   Other findings   Moderate right pleural effusion.   IMPRESSION: 1. Normal MRI of the brachial plexus bilaterally. 2. Mild tendinosis of the right supraspinatus tendon. 3. Moderate right pleural effusion.  ASSESSMENT: This is Delsa Sale, a 62 y.o. male with:  ***  Plan: ***  Return to clinic in ***  Total time spent reviewing records, interview, history/exam, documentation, and coordination of care on day of encounter:  *** min  Jacquelyne Balint, MD

## 2023-06-09 ENCOUNTER — Encounter: Payer: Self-pay | Admitting: Student in an Organized Health Care Education/Training Program

## 2023-06-09 ENCOUNTER — Ambulatory Visit
Payer: BC Managed Care – PPO | Attending: Student in an Organized Health Care Education/Training Program | Admitting: Student in an Organized Health Care Education/Training Program

## 2023-06-09 VITALS — BP 122/87 | HR 90 | Temp 100.2°F | Resp 18 | Ht 68.0 in | Wt 178.0 lb

## 2023-06-09 DIAGNOSIS — G540 Brachial plexus disorders: Secondary | ICD-10-CM | POA: Diagnosis not present

## 2023-06-09 DIAGNOSIS — G894 Chronic pain syndrome: Secondary | ICD-10-CM | POA: Diagnosis not present

## 2023-06-09 DIAGNOSIS — F419 Anxiety disorder, unspecified: Secondary | ICD-10-CM | POA: Insufficient documentation

## 2023-06-09 DIAGNOSIS — M79601 Pain in right arm: Secondary | ICD-10-CM

## 2023-06-09 DIAGNOSIS — F32A Depression, unspecified: Secondary | ICD-10-CM | POA: Diagnosis not present

## 2023-06-09 DIAGNOSIS — G90511 Complex regional pain syndrome I of right upper limb: Secondary | ICD-10-CM | POA: Diagnosis not present

## 2023-06-09 DIAGNOSIS — M792 Neuralgia and neuritis, unspecified: Secondary | ICD-10-CM | POA: Insufficient documentation

## 2023-06-09 MED ORDER — AMITRIPTYLINE HCL 25 MG PO TABS: 75.0000 mg | ORAL_TABLET | Freq: Every day | ORAL | 0 refills | Status: DC

## 2023-06-09 NOTE — Progress Notes (Signed)
PROVIDER NOTE: Information contained herein reflects review and annotations entered in association with encounter. Interpretation of such information and data should be left to medically-trained personnel. Information provided to patient can be located elsewhere in the medical record under "Patient Instructions". Document created using STT-dictation technology, any transcriptional errors that may result from process are unintentional.    Patient: Brian [name]  Service Category: E/M  Provider: Edward Jolly, MD  DOB: February 15, 1961  DOS: 06/09/2023  Referring Provider: [name], MD  MRN: [id number]  Specialty: Interventional Pain Management  PCP: [name], MD  Type: Established Patient  Setting: Ambulatory outpatient    Location: Office  Delivery: Face-to-face     HPI  [name], a 62 y.o. year old male, is here today because of his Complex regional pain syndrome type 1 of right upper extremity [G90.511]. Mr. Krise primary complain today is Hand Pain (Right ) and Arm Pain (Right forearm)  Pain Assessment: Severity of Chronic pain is reported as a 6 /10. Location: Hand Right/right forearm. Onset: More than a month ago. Quality: Archie Patten, Other (Comment) (electrical shock). Timing: Constant. Modifying factor(s): denies. Vitals:  height is 5\' 8"  (1.727 m) and weight is 178 lb (80.7 kg). His temporal temperature is 100.2 F (37.9 C). His blood pressure is 122/87 and his pulse is 90. His respiration is 18 and oxygen saturation is 98%.  BMI: Estimated body mass index is 27.06 kg/m as calculated from the following:   Height as of this encounter: 5\' 8"  (1.727 m).   Weight as of this encounter: 178 lb (80.7 kg). Last encounter: 05/04/2023. Last procedure: 05/12/2023.  Reason for encounter: post-procedure evaluation and assessment.    Post-procedure evaluation    Procedure:          Anesthesia, Analgesia, Anxiolysis:  Type: RIGHT INTERSCALENE BLOCK Region: lateral  border of the sternocleidomastoid muscle was palpated to identify the inter-scalene groove between the anterior and middle scalene muscles. Laterality: Right-Sided   Anesthesia: Local (1-2% Lidocaine)  Anxiolysis:  Valium   Sedation: None  Guidance: Ultrasound           Position: Supine   1. Radiation-induced brachial plexopathy (RIGHT)   2. Complex regional pain syndrome type 1 of right upper extremity   3. 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 Chronic pain syndrome    NAS-11 Pain score:   Pre-procedure: 4 /10   Post-procedure: 5 /10      Effectiveness:  Initial hour after procedure: 0 % Subsequent 4-6 hours post-procedure:  (30-50%)  Analgesia past initial 6 hours: 30 % (2 weeks then slowly returned to baseline, is now worse.)  Ongoing improvement:  Analgesic: Back to baseline Function: Back to baseline ROM: Back to baseline     ROS  Constitutional: Denies any fever or chills Gastrointestinal: No reported hemesis, hematochezia, vomiting, or acute GI distress Musculoskeletal:  Right hand and forearm swelling, pain, numbness, tingling Neurological: No reported episodes of acute onset apraxia, aphasia, dysarthria, agnosia, amnesia, paralysis, loss of coordination, or loss of consciousness  Medication Review  DULoxetine, Fluticasone-Umeclidin-Vilant, albuterol, alectinib, amLODipine, amitriptyline, atorvastatin, baclofen, gabapentin, and naproxen sodium  History Review  Allergy: Brian [name] has No Known Allergies. Drug: Brian [name]  reports no history of drug use. Alcohol:  reports current alcohol use of about 5.0 standard drinks of alcohol per week. Tobacco:  reports that he has never smoked. He has never used smokeless tobacco. Social: Brian [name]  reports that he has never smoked.  He has never used smokeless tobacco. He reports current alcohol use of about 5.0 standard drinks of alcohol per week. He reports that he does not use drugs. Medical:  has a past medical  history of COPD (chronic obstructive pulmonary disease) (HCC), Diabetes mellitus without complication (HCC), Hypertension, nscl ca (dx'd 08/2018), Pneumonia, and Subarachnoid hemorrhage (HCC). Surgical: Brian [name]  has a past surgical history that includes Leg Surgery (age 29 ); IR IVC FILTER PLMT / S&I /IMG GUID/MOD SED (03/04/2020); Inguinal hernia repair (Right, 06/24/2020); IR Radiologist Eval & Mgmt (08/06/2020); and IR IVC FILTER RETRIEVAL / S&I /IMG GUID/MOD SED (08/15/2020). Family: family history includes Diabetes in his brother and mother.  Laboratory Chemistry Profile   Renal Lab Results  Component Value Date   BUN 21 (H) 11/06/2022   CREATININE 1.03 11/06/2022   GFRAA >60 05/03/2020   GFRNONAA >60 11/06/2022    Hepatic Lab Results  Component Value Date   AST 31 11/06/2022   ALT 43 11/06/2022   ALBUMIN 4.2 11/06/2022   ALKPHOS 100 11/06/2022   LIPASE 25 10/06/2015    Electrolytes Lab Results  Component Value Date   NA 137 11/06/2022   K 4.2 11/06/2022   CL 104 11/06/2022   CALCIUM 9.0 11/06/2022   MG 2.3 05/20/2020    Bone No results found for: "VD25OH", "VD125OH2TOT", "BM8413KG4", "WN0272ZD6", "25OHVITD1", "25OHVITD2", "25OHVITD3", "TESTOFREE", "TESTOSTERONE"  Inflammation (CRP: Acute Phase) (ESR: Chronic Phase) No results found for: "CRP", "ESRSEDRATE", "LATICACIDVEN"       Note: Above Lab results reviewed.  Recent Imaging Review  MR BRACHIAL PLEXUS W WO CM CLINICAL DATA:  Chemotherapy related brachial plexopathy.  EXAM: MRI BRACHIAL PLEXUS WITHOUT AND WITH CONTRAST  TECHNIQUE: Multiplanar, multiecho pulse sequences of the neck and surrounding structures were obtained without and with intravenous contrast. The field of view was focused on the bilateral brachial plexus from the neural foramina to the axilla.  CONTRAST:  8mL GADAVIST GADOBUTROL 1 MMOL/ML IV SOLN  COMPARISON:  None Available.  FINDINGS: Spinal cord  Normal caliber and signal of visualized  spinal cord.  Brachial plexus  Roots: Normal.  Trunks: Normal.  Divisions: Normal.  Cords: Normal.  Branches: Normal.  Muscles and tendons  Muscles are normal. No muscle atrophy or muscle edema. Mild tendinosis of the right supraspinatus tendon. Remainder the rotator cuff tendons bilaterally are intact.  Bones  Cervical spine: Mild broad-based disc bulge at C5-6.  C7 transverse processes: Normal.  Marrow signal: Normal.  Other: Mild osteoarthritis of bilateral glenohumeral joints.  Other findings  Moderate right pleural effusion.  IMPRESSION: 1. Normal MRI of the brachial plexus bilaterally. 2. Mild tendinosis of the right supraspinatus tendon. 3. Moderate right pleural effusion.  Electronically Signed   By: Elige Ko M.D.   On: 04/21/2023 07:53 MR BRACHIAL PLEXUS W WO CM RT CLINICAL DATA:  Chemotherapy related brachial plexopathy.  EXAM: MRI BRACHIAL PLEXUS WITHOUT AND WITH CONTRAST  TECHNIQUE: Multiplanar, multiecho pulse sequences of the neck and surrounding structures were obtained without and with intravenous contrast. The field of view was focused on the bilateral brachial plexus from the neural foramina to the axilla.  CONTRAST:  8mL GADAVIST GADOBUTROL 1 MMOL/ML IV SOLN  COMPARISON:  None Available.  FINDINGS: Spinal cord  Normal caliber and signal of visualized spinal cord.  Brachial plexus  Roots: Normal.  Trunks: Normal.  Divisions: Normal.  Cords: Normal.  Branches: Normal.  Muscles and tendons  Muscles are normal. No muscle atrophy or muscle edema. Mild tendinosis of  the right supraspinatus tendon. Remainder the rotator cuff tendons bilaterally are intact.  Bones  Cervical spine: Mild broad-based disc bulge at C5-6.  C7 transverse processes: Normal.  Marrow signal: Normal.  Other: Mild osteoarthritis of bilateral glenohumeral joints.  Other findings  Moderate right pleural effusion.  IMPRESSION: 1. Normal  MRI of the brachial plexus bilaterally. 2. Mild tendinosis of the right supraspinatus tendon. 3. Moderate right pleural effusion.  Electronically Signed   By: Elige Ko M.D.   On: 04/21/2023 07:53  Cervical Imaging: Cervical MR wo contrast: Results for orders placed during the hospital encounter of 07/30/22   MR CERVICAL SPINE WO CONTRAST   Narrative CLINICAL DATA:  Right upper extremity paresthesias; history of radiation therapy for lung cancer   EXAM: MRI CERVICAL SPINE WITHOUT CONTRAST   TECHNIQUE: Multiplanar, multisequence MR imaging of the cervical spine was performed. No intravenous contrast was administered.   COMPARISON:  None Available.   FINDINGS: Alignment: Physiologic.   Vertebrae: No fracture, evidence of discitis, or bone lesion.   Cord: Normal signal and morphology.   Posterior Fossa, vertebral arteries, paraspinal tissues: Negative.   Disc levels:   C2-C3: No significant disc bulge. Mild facet arthropathy. No spinal canal stenosis or neural foraminal narrowing.   C3-C4: No significant disc bulge. Left-greater-than-right facet arthropathy. No spinal canal stenosis or neural foraminal narrowing.   C4-C5: No significant disc bulge. Left-greater-than-right facet arthropathy. No spinal canal stenosis or neural foraminal narrowing.   C5-C6: Minimal disc bulge with right paracentral and subarticular disc protrusion, which indents the thecal sac but does not deform the spinal cord. Facet and uncovertebral hypertrophy. No spinal canal stenosis. Mild right neural foraminal narrowing.   C6-C7: No significant disc bulge. No spinal canal stenosis or neuroforaminal narrowing.   C7-T1: No significant disc bulge. No spinal canal stenosis or neuroforaminal narrowing.   IMPRESSION: 1. C5-C6 mild right neural foraminal narrowing. 2. Multilevel facet arthropathy, which can be a cause of pain.     Electronically Signed By: Wiliam Ke M.D. On:  08/01/2022 19:19     EMG (03/30/23): NCV & EMG Findings: Extensive electrodiagnostic evaluation of bilateral upper limbs shows: Bilateral median antebrachial cutaneous (MAC) and right lateral antebrachial cutaneous (LAC) sensory responses are absent. Bilateral median, bilateral ulnar, and bilateral radial sensory responses show reduced amplitudes (see table below). Left LAC is present. Bilateral median (APB) motor responses show reduced amplitudes (right 1.17, left 4.0 mV). Right ulnar (ADM) motor response shows reduced amplitude (4.0 mV). Left ulnar (ADM) motor response is within normal limits, but borderline normal (7.3 mV). Chronic motor axon loss changes WITH accompanying active denervation changes are seen in the bilateral flexor pollicis longus, right first dorsal interosseous, right extensor inidicis proprius, abductor pollicis brevis, and triceps muscles. Chronic motor axon loss changes WITHOUT active denervation changes are seen in right biceps, right deltoid, right infraspinatus, left first dorsal interosseous, left extensor indicis proprius, and left abductor pollicis brevis muscles.  Myokymic discharges are seen in the right flexor pollicis longus, right biceps, and right infraspinatus muscles.   Impression: This is an abnormal study. 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 There are myokymic discharges in 3 muscles of the right upper limb, as may be seen in radiation plexopathy. 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 An overlapping left C8-T1 radiculopathy cannot be completely excluded, though normal  cervical paraspinal evaluation makes #2 more likely.   Complexity Note: Imaging results reviewed.                         Note: Reviewed        Physical Exam  General appearance: Well  nourished, well developed, and well hydrated. In no apparent acute distress Mental status: Alert, oriented x 3 (person, place, & time)       Respiratory: No evidence of acute respiratory distress Eyes: PERLA Vitals: BP 122/87   Pulse 90   Temp 100.2 F (37.9 C) (Temporal)   Resp 18   Ht 5\' 8"  (1.727 m)   Wt 178 lb (80.7 kg)   SpO2 98%   BMI 27.06 kg/m  BMI: Estimated body mass index is 27.06 kg/m as calculated from the following:   Height as of this encounter: 5\' 8"  (1.727 m).   Weight as of this encounter: 178 lb (80.7 kg). Ideal: Ideal body weight: 68.4 kg (150 lb 12.7 oz) Adjusted ideal body weight: 73.3 kg (161 lb 10.8 oz)  Cervical Spine Area Exam  Skin & Axial Inspection: No masses, redness, edema, swelling, or associated skin lesions Alignment: Symmetrical Functional ROM: Unrestricted ROM      Stability: No instability detected Muscle Tone/Strength: Functionally intact. No obvious neuro-muscular anomalies detected. Sensory (Neurological): Neurogenic pain pattern Palpation: No palpable anomalies             Upper Extremity (UE) Exam      Side: Right upper extremity   Side: Left upper extremity  Skin & Extremity Inspection: Edema, color change, allodynia   Skin & Extremity Inspection: Skin color, temperature, and hair growth are WNL. No peripheral edema or cyanosis. No masses, redness, swelling, asymmetry, or associated skin lesions. No contractures.  Functional ROM: Pain restricted ROM for all joints of upper extremity   Functional ROM: Pain restricted ROM          Muscle Tone/Strength: Movement possible against some resistance (4/5) atrophy noted of the right upper extremity   Muscle Tone/Strength: Functionally intact. No obvious neuro-muscular anomalies detected.  Sensory (Neurological): Neurogenic pain pattern           Sensory (Neurological): Unimpaired          Palpation: Hypothermic               Palpation: No palpable anomalies              Provocative Test(s):   Phalen's test: deferred Tinel's test: deferred Apley's scratch test (touch opposite shoulder):  Action 1 (Across chest): deferred Action 2 (Overhead): deferred Action 3 (LB reach): deferred     Provocative Test(s):  Phalen's test: deferred Tinel's test: deferred Apley's scratch test (touch opposite shoulder):  Action 1 (Across chest): deferred Action 2 (Overhead): deferred Action 3 (LB reach): deferred    Assessment   Diagnosis Status  1. Complex regional pain syndrome type 1 of right upper extremity   2. Radiation-induced brachial plexopathy (RIGHT)   3. 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 Right arm pain   5. Chronic pain syndrome    Persistent Persistent Persistent    Plan of Care   Patient had some short-term benefit after his right interscalene block.  He states that his pain is this back to what it was prior to his procedure.  He has been participating in aquatic therapy.  He states that he has very limited range of motion of his arm  and I can appreciate pretty significant swelling of his right hand up to his forearm.  Some movement against resistance.  At this point I recommend repeating a right interscalene block #2 and depending upon how he does with that, we can further discuss subsequent treatment options which could include peripheral nerve stimulation of the brachial plexus.  I did discuss this with the patient and his wife for a short period of time.  Patient also has symptoms consistent with complex regional pain syndrome and meets [place] criteria of the right upper extremity.  I will discuss this case further with my colleague, Dr. Laban Emperor.  I did discuss a right stellate ganglion nerve block with the patient for management of his right CRPS related pain.  I have also encouraged him to follow-up with Dr. Katrinka Blazing with neurosurgery.  Future considerations could include peripheral nerve stimulation of the right brachial plexus versus spinal cord stimulation in the  cervical region.  In the interim, I would like for him to increase his amitriptyline from 50 to 75 mg nightly.  He is endorsing some dry mouth but not enough to limit him from dose escalation.  He is not endorsing any other anticholinergic side effects that can typically be seen with amitriptyline.  Pharmacotherapy (Medications Ordered): Meds ordered this encounter  Medications   amitriptyline (ELAVIL) 25 MG tablet    Sig: Take 3 tablets (75 mg total) by mouth at bedtime.    Dispense:  90 tablet    Refill:  0   Orders:  Orders Placed This Encounter  Procedures   BRACHIAL PLEXUS BLOCK    Standing Status:   Future    Standing Expiration Date:   09/09/2023    Scheduling Instructions:     Side: RIGHT     Sedation: Patient's choice.     Timeframe: 2 weeks    Order Specific Question:   Where will this procedure be performed?    Answer:   [facility]   STELLATE GANGLION BLOCK    Purpose: Therapeutic/Palliative Indication(s):  Upper extremity CRPS (G90.519)    Standing Status:   Future    Standing Expiration Date:   09/09/2023    Scheduling Instructions:     Procedure: Stellate ganglion block (SGB)     Laterality: RIGHT     Level(s): C6     Sedation: With Sedation.     Scheduling Timeframe: As permitted by the schedule    Order Specific Question:   Where will this procedure be performed?    Answer:   [facility]   Follow-up plan:   Return in about 2 weeks (around 06/23/2023) for Right Interscalene block #2 and possible Stellate Ganglion block Valium.     Recent Visits Date Type Provider Dept  05/12/23 Procedure visit Edward Jolly, MD [facility]  05/04/23 Office Visit Edward Jolly, MD [facility]  Showing recent visits within past 90 days and meeting all other requirements Today's Visits Date Type Provider Dept  06/09/23 Office Visit Edward Jolly, MD [facility]  Showing today's visits and meeting all other requirements Future  Appointments No visits were found meeting these conditions. Showing future appointments within next 90 days and meeting all other requirements  I discussed the assessment and treatment plan with the patient. The patient was provided an opportunity to ask questions and all were answered. The patient agreed with the plan and demonstrated an understanding of the instructions.  Patient advised to call back or seek an in-person evaluation  if the symptoms or condition worsens.  Duration of encounter: 30 minutes.  Total time on encounter, as per AMA guidelines included both the face-to-face and non-face-to-face time personally spent by the physician and/or other qualified health care professional(s) on the day of the encounter (includes time in activities that require the physician or other qualified health care professional and does not include time in activities normally performed by clinical staff). Physician's time may include the following activities when performed: Preparing to see the patient (e.g., pre-charting review of records, searching for previously ordered imaging, lab work, and nerve conduction tests) Review of prior analgesic pharmacotherapies. Reviewing PMP Interpreting ordered tests (e.g., lab work, imaging, nerve conduction tests) Performing post-procedure evaluations, including interpretation of diagnostic procedures Obtaining and/or reviewing separately obtained history Performing a medically appropriate examination and/or evaluation Counseling and educating the patient/family/caregiver Ordering medications, tests, or procedures Referring and communicating with other health care professionals (when not separately reported) Documenting clinical information in the electronic or other health record Independently interpreting results (not separately reported) and communicating results to the patient/ family/caregiver Care coordination (not separately reported)  Note by: Edward Jolly, MD Date: 06/09/2023; Time: 3:58 PM

## 2023-06-09 NOTE — Patient Instructions (Addendum)
______________________________________________________________________    Post-Procedure Discharge Instructions  Instructions: Apply ice:  Purpose: This will minimize any swelling and discomfort after procedure.  When: Day of procedure, as soon as you get home. How: Fill a plastic sandwich bag with crushed ice. Cover it with a small towel and apply to injection site. How long: (15 min on, 15 min off) Apply for 15 minutes then remove x 15 minutes.  Repeat sequence on day of procedure, until you go to bed. Apply heat:  Purpose: To treat any soreness and discomfort from the procedure. When: Starting the next day after the procedure. How: Apply heat to procedure site starting the day following the procedure. How long: May continue to repeat daily, until discomfort goes away. Food intake: Start with clear liquids (like water) and advance to regular food, as tolerated.  Physical activities: Keep activities to a minimum for the first 8 hours after the procedure. After that, then as tolerated. Driving: If you have received any sedation, be responsible and do not drive. You are not allowed to drive for 24 hours after having sedation. Blood thinner: (Applies only to those taking blood thinners) You may restart your blood thinner 6 hours after your procedure. Insulin: (Applies only to Diabetic patients taking insulin) As soon as you can eat, you may resume your normal dosing schedule. Infection prevention: Keep procedure site clean and dry. Shower daily and clean area with soap and water. Post-procedure Pain Diary: Extremely important that this be done correctly and accurately. Recorded information will be used to determine the next step in treatment. For the purpose of accuracy, follow these rules: Evaluate only the area treated. Do not report or include pain from an untreated area. For the purpose of this evaluation, ignore all other areas of pain, except for the treated area. After your procedure,  avoid taking a long nap and attempting to complete the pain diary after you wake up. Instead, set your alarm clock to go off every hour, on the hour, for the initial 8 hours after the procedure. Document the duration of the numbing medicine, and the relief you are getting from it. Do not go to sleep and attempt to complete it later. It will not be accurate. If you received sedation, it is likely that you were given a medication that may cause amnesia. Because of this, completing the diary at a later time may cause the information to be inaccurate. This information is needed to plan your care. Follow-up appointment: Keep your post-procedure follow-up evaluation appointment after the procedure (usually 2 weeks for most procedures, 6 weeks for radiofrequencies). DO NOT FORGET to bring you pain diary with you.   Expect: (What should I expect to see with my procedure?) From numbing medicine (AKA: Local Anesthetics): Numbness or decrease in pain. You may also experience some weakness, which if present, could last for the duration of the local anesthetic. Onset: Full effect within 15 minutes of injected. Duration: It will depend on the type of local anesthetic used. On the average, 1 to 8 hours.  From steroids (Applies only if steroids were used): Decrease in swelling or inflammation. Once inflammation is improved, relief of the pain will follow. Onset of benefits: Depends on the amount of swelling present. The more swelling, the longer it will take for the benefits to be seen. In some cases, up to 10 days. Duration: Steroids will stay in the system x 2 weeks. Duration of benefits will depend on multiple posibilities including persistent irritating factors. Side-effects: If  present, they may typically last 2 weeks (the duration of the steroids). Frequent: Cramps (if they occur, drink Gatorade and take over-the-counter Magnesium 450-500 mg once to twice a day); water retention with temporary weight gain;  increases in blood sugar; decreased immune system response; increased appetite. Occasional: Facial flushing (red, warm cheeks); mood swings; menstrual changes. Uncommon: Long-term decrease or suppression of natural hormones; bone thinning. (These are more common with higher doses or more frequent use. This is why we prefer that our patients avoid having any injection therapies in other practices.)  Very Rare: Severe mood changes; psychosis; aseptic necrosis. From procedure: Some discomfort is to be expected once the numbing medicine wears off. This should be minimal if ice and heat are applied as instructed.  Call if: (When should I call?) You experience numbness and weakness that gets worse with time, as opposed to wearing off. New onset bowel or bladder incontinence. (Applies only to procedures done in the spine)  Emergency Numbers: Durning business hours (Monday - Thursday, 8:00 AM - 4:00 PM) (Friday, 9:00 AM - 12:00 Noon): [phone] After hours: [phone] NOTE: If you are having a problem and are unable connect with, or to talk to a provider, then go to your nearest urgent care or emergency department. If the problem is serious and urgent, please call 911. ______________________________________________________________________    Stellate Ganglion Block Patient Information  Description: The stellate ganglion is part of a chain of nerves in the neck that innervate the shoulder, arm, and hand.  This chain of nerves lies beside the windpipe.  By injecting local anesthesia along the stellate ganglion, we block the nerve innervation to the arm and hand on that particular side.  Generally only sensory innervation (touch) is blocked and not motor innervation (strength).  This allows pain-free physical therapy, which is vital to many of the conditions that this nerve block helps.   After numbing the skin with local anesthesia, a small needle is passed to the stellate ganglion and the local  anesthesia is again deposited.  The procedure generally takes less than one minute.  Conditions that my be helped with stellate ganglion blockade;  Reflex sympathetic dystrophy (RSD) Postherpetic neuralgia Diabetic neuropathy Pain from nerve lesions or injury Herpes zoster ( shingles Vascular disease Raynaud's disease  Preparation for the injection: Do not eat any solid food or dairy products within 8 hours of your appointment. You may drink clear liquids up to 3 hours before appointment.  Clear liquids include water, black coffee, juice or soda.  No milk or cream please. You may take your regular medication, including pain medications, with a sip of water before you appointment.  Diabetics should hold regular insulin (if taken separately) and take 1/2 normal NPH dose the morning of the procedure.  Carry some sugar containing items with you to your appointment. A drive must accompany you and be prepared to drive you home after your procedure. Bring all your current medications with you. An IV may be inserted and sedation may be given at the discretion of the physician. A blood pressure cuff, EKG and other monitors will often be applied during the procedure.  Some patients may need to have extra oxygen administered for a short period. You will be asked to provide medical information, including your allergies, prior to the procedure.  We must know immediately if you are taking blood thinners (like Coumadin) or if you are allergic to IV iodine contrast (dye).  Possible side-effects  Bleeding from needle site  Blurry vision and droopy eyelid in that eye (temporary) Stuffy nose Temporary shortness of breath Weakness in arm (temporary) Arm may feel warm   Possible complications: All of these are extremely RARE  Seizures Infection Lung puncture Bleeding in the neck Nerve injury  Call if you experience:  Fever/chills Extreme shortness of breath or inability to swallow Redness,  inflammation or drainage from the site Weakness or numbness that last for greater than 24 hours  Please note:  If effective, we may have to do up to 3-5 blocks in a 3-4 week interval with physical therapy (depending on which condition you are being treated for).  After the procedure: You will be observed in the outpatient area and discharged home.  Please consult the discharge instructions given after the procedure for any post-procedure questions.  If you have any questions please call [phone] [facility]    Complex regional pain syndrome  We discussed a stellate ganglion block

## 2023-06-10 ENCOUNTER — Ambulatory Visit: Payer: BC Managed Care – PPO | Attending: Family Medicine | Admitting: Occupational Therapy

## 2023-06-10 DIAGNOSIS — M25641 Stiffness of right hand, not elsewhere classified: Secondary | ICD-10-CM | POA: Diagnosis not present

## 2023-06-10 DIAGNOSIS — M79601 Pain in right arm: Secondary | ICD-10-CM | POA: Diagnosis not present

## 2023-06-10 DIAGNOSIS — M6281 Muscle weakness (generalized): Secondary | ICD-10-CM | POA: Insufficient documentation

## 2023-06-10 DIAGNOSIS — R600 Localized edema: Secondary | ICD-10-CM | POA: Diagnosis not present

## 2023-06-10 DIAGNOSIS — M79602 Pain in left arm: Secondary | ICD-10-CM | POA: Diagnosis not present

## 2023-06-10 DIAGNOSIS — R278 Other lack of coordination: Secondary | ICD-10-CM | POA: Diagnosis not present

## 2023-06-10 NOTE — Therapy (Signed)
OUTPATIENT OCCUPATIONAL THERAPY NEURO TREATMENT  Patient Name: DEWAYNE KOK MRN: [id number] DOB:03-08-1961, 62 y.o., male Today's Date: 06/10/2023  PCP: Keturah Barre REFERRING PROVIDER: Jacquelyne Balint  END OF SESSION:  OT End of Session - 06/10/23 0758     Visit Number 15    Number of Visits 25    Date for OT Re-Evaluation 06/28/23    Authorization Type [employer]  $1600 deductible has been met  20% coinsurance applies  $8500 oop, zero applied  VL combined with pt, ot, st: 60    OT Start Time 0800    OT Stop Time 0845    OT Time Calculation (min) 45 min    Activity Tolerance Patient tolerated treatment well    Behavior During Therapy [facility] for tasks assessed/performed             Past Medical History:  Diagnosis Date   COPD (chronic obstructive pulmonary disease) (HCC)    Diabetes mellitus without complication (HCC)    Hypertension    nscl ca dx'd 08/2018   Pneumonia    Subarachnoid hemorrhage (HCC)    Past Surgical History:  Procedure Laterality Date   INGUINAL HERNIA REPAIR Right 06/24/2020   Procedure: RIGHT OPEN INGUINAL HERNIA REPAIR WITH MESH;  Surgeon: Kinsinger, De Blanch, MD;  Location: [facility];  Service: General;  Laterality: Right;   IR IVC FILTER PLMT / S&I Lenise Arena GUID/MOD SED  03/04/2020   IR IVC FILTER RETRIEVAL / S&I Lenise Arena GUID/MOD SED  08/15/2020   IR RADIOLOGIST EVAL & MGMT  08/06/2020   LEG SURGERY  age 35    left leg, from fracture   Patient Active Problem List   Diagnosis Date Noted   Complex regional pain syndrome type 1 of right upper extremity 05/04/2023   Right arm pain 05/04/2023   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   Recurrent right pleural effusion 07/23/2021   Hypothyroidism 10/24/2020   Acute hypoxemic respiratory failure (HCC) 05/19/2020   Acute respiratory failure with hypoxia (HCC) 05/18/2020   Essential hypertension 05/18/2020   Type 2 diabetes mellitus with hyperlipidemia (HCC) 05/18/2020   S/P thoracentesis 03/21/2020    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/05/2020   Calf swelling 02/21/2020   Rash 02/07/2020   Adenocarcinoma of right lung, stage 4 (HCC) 02/07/2020   Chronic cough 12/20/2019   Venous thrombosis 09/21/2019   Shortness of breath 09/07/2019   Headache 08/09/2019   Lobar pneumonia, unspecified organism (HCC) 03/07/2019   Centrilobular emphysema (HCC) 03/07/2019   Encounter for antineoplastic immunotherapy 11/10/2018   Adenocarcinoma of right lung, stage 3 (HCC) 08/25/2018   Goals of care, counseling/discussion 08/25/2018   Encounter for antineoplastic chemotherapy 08/25/2018   Mass of upper lobe of right lung 08/04/2018   Subarachnoid hemorrhage (HCC) 05/22/2015    ONSET DATE: 03/17/23 Referral  REFERRING DIAG: UE Pain and paresthesias  THERAPY DIAG:  Pain in right arm  Muscle weakness (generalized)  Other lack of coordination  Edema of hand  Rationale for Evaluation and Treatment: Rehabilitation  SUBJECTIVE:   SUBJECTIVE STATEMENT: Pt reports that he has had decreased use of R arm over the last week.  He did not have aquatic therapy but went to the pool on his own but he was not able to perform the same motions as his last pool therapy session ie) reaching behind his back/up his back.     Had appt with pain management specialist yesterday.  Had a nerve block and is being set up for another one with an  additional procedure to try and   Nerve stimulator possible installed  My brain anis    The shoulder pain is still there... what I'm having now is muscular , tried his sling but didn't notice any improvmeent in sholder pain. Then pins and needles are in the entire arm and that is constant (sometimes better than others but still constant)  Pt accompanied by: self  PERTINENT HISTORY: Lung cancer w/ radiation, SAH 2016. Pt reports developing symptoms RUE this February and worsening symptoms in Rt hand more recently - MD thinks this is due to radiation approx 4 years  ago  PRECAUTIONS: None  WEIGHT BEARING RESTRICTIONS: No  PAIN:  Are you having pain? Yes: NPRS scale: 5/10 Pain location: right arm/shoulder - mostly shoulder but also into hand and also reports it is starting to get tingling on L side as well into his hand Pain description: sharp, stabbing, throbbing, aching Aggravating factors: idle - positional Relieving factors: heat, ice, gabapentin  FALLS: Has patient fallen in last 6 months? Yes. Number of falls 1- fell last night  LIVING ENVIRONMENT: Lives with: lives with their family and lives with their spouse Lives in: House/apartment Stairs: Yes: External: 2 steps; none Has following equipment at home:  has built in shower seat  PLOF: Independent with basic ADLs  PATIENT GOALS: to be able to get back to some level of use of my right hand  OBJECTIVE:   HAND DOMINANCE: Right  ADLs: Overall ADLs: min assist and increased time Transfers/ambulation related to ADLs: Eating: min assist - wife assists with cutting.  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 driving - uses LUE only at this point Handwriting: unable   POSTURE COMMENTS:  forward head and posterior pelvic tilt   ACTIVITY TOLERANCE: Activity tolerance: reports more fatigue due to constant pain   UPPER EXTREMITY ROM:    Active ROM Right eval Left eval  Shoulder flexion 115 unable to maintain ext elbow 140  Shoulder abduction    Shoulder adduction    Shoulder extension    Shoulder internal rotation    Shoulder external rotation    Elbow flexion WFL   Elbow extension WFL   Wrist flexion    Wrist extension  30   Wrist ulnar deviation 10   Wrist radial deviation 5   Wrist pronation    Wrist supination    (Blank rows = not tested)  UPPER EXTREMITY MMT:     MMT Right eval Left eval  Shoulder flexion NT 4-/5  Shoulder abduction    Shoulder adduction    Shoulder extension    Shoulder internal rotation    Shoulder external rotation    Middle trapezius    Lower trapezius    Elbow flexion    Elbow extension    Wrist flexion 3   Wrist extension 3   Wrist ulnar deviation    Wrist radial deviation    Wrist pronation    Wrist supination    (Blank rows = not tested) RIGHT HAND - 15% COMPOSITE FLEXION     - 40% COMPOSITE EXTENSION HAND FUNCTION: Grip strength: Right: UNABLE lbs; Left: 31.9 lbs  COORDINATION: Finger Nose Finger test: Mild undershooting Left - right  lacks strength/ proximal control to complete  SENSATION: WFL to testing, but reports heightened sensation - hypersensitive  EDEMA: Atrophy RUE - UPPER ARM, FOREARM  MUSCLE TONE: RUE: Moderate and Hypotonic and LUE: Within functional limits  COGNITION: Overall cognitive status: Within functional limits for tasks assessed.  Reports some concentration deficits which he attributes to pain  VISION: Subjective report: no changes Baseline vision: Wears glasses all the time Visual history:  nothing significant  VISION ASSESSMENT: Not tested  Patient has difficulty with following activities due to following visual impairments: NA  PERCEPTION: WFL  PRAXIS: WFL   TODAY'S TREATMENT:                                                                                                                              DATE:   06/10/23 Manual/TA/TE:  Pt had significant edema in R UE measured to be 23.8 cm around palm of hand at beginning of session and 23 cm at end of session s/p elevation, retrograde massage and ROM - passive and active assisted with RUE.   Explored elevation options available online ie) pillows with edges to  keep arm in place in addition to review of edema glove options which he has at home but has difficulty applying.  Pt completed and encouraged to continue at home with table slides of BUEs with gravity eliminated plane for shoulder flexion, horizontal abd/add, and elbow flexion/extension.  With support by OT at wrist and elbow, he is able to pull hand towards his body/face and was happy to see some movement as he came in saying he couldn't move his arm anymore.  As edema was addressed, pt able to get trace movement in all UE joints from elbow (flex/ext), forearm (pron/sup), wrist (flex/ext) and digits (flex/ext) although digits very stiff with edema today.   05/31/23 Patient seen for aquatic therapy visit.  Entered and exited the pool independently.  Pain 2/10 at start of session.   Worked on improving range of motion in BUE - more targeted to Right shoulder and elbow.  Supine floatation to address body on arm movement as well as arm/trunk/ head interaction.  Patient with significantly improved GH IR/ER and abduction this session.  Maintaining elbow in extension with less pain than prior sessions.  Patient able to swim in supine - arm stroke of elementary backstroke x 5 reps.    PATIENT EDUCATION: Education details: Retrograde massage Person educated: Patient Education method: Explanation, Demonstration, and Verbal cues Education comprehension: verbalized understanding, returned demonstration, and verbal cues required  HOME EXERCISE PROGRAM: 04/14/23: bed positioning, AA/ROM HEP for RUE 04/22/23: passive stretching HEP for Rt hand, edema management strategies   GOALS: Goals reviewed with patient? Yes  SHORT TERM GOALS: Target date: 04/30/23  Patient will complete HEP designed to improve Bilateral shoulder range of motion  Goal status:MET  2.  Patient will complete HEP designed to improve R elbow flex/ext strength  Goal status: MET  3.  Patient  will complete HEP designed to improve range  of motion for composite flexion and extension in right hand/wrist  Goal status: MET  4.  Patient will demonstrate ability to pick up and retrieve lightweight object from table with RUE - e.g. cell phone  Goal status: IN PROGRESS  5.  Patient will report improved awareness of positional changes/ support techniques to reduce UE pain.    Goal status: MET   6.  Patient will demonstrate 50% composite active flex/ext in right hand  Goal status: IN PROGRESS   LONG TERM GOALS: Target date: 06/28/23  Patient will complete updated HEP designed on UE strengthening without increase in pain  Goal status: IN PROGRESS  2.  Patient will demonstrate 10 lb grip strength R hand  Goal status: IN PROGRESS  3.  Patient will demonstrate low reach RUE to obtain and release a 2 lb object x 5 without dropping  Goal status: IN PROGRESS  4.  Patient will be able to cut his own food with modified independence  Goal status: IN PROGRESS  5.  Patient will be independent with aquatic exercise program addressing BUE active movement and strength  Goal status: IN PROGRESS  6.  Patient will dress himself with modified independence  Goal status: IN PROGRESS  ASSESSMENT:  CLINICAL IMPRESSION: Patient is seen today for occupational therapy treatment for RUE swelling, weakness, pain and sensory changes. Patient currently presents well below baseline level of function with his dominant RUE has significant swelling in R hand today with increased limitations in AROM/use of hand compared to previous session.  Extensive education and assistance provided with retrograde massage with nearly a 1 cm change circumference of her hand over course of session. Pt will benefit from continued skilled OT services in the outpatient and aquatic settings to work on impairments and help pt return to higher level of functioning as able.    PERFORMANCE DEFICITS: in functional skills including dexterity, proprioception, sensation,  tone, ROM, strength, pain, muscle spasms, Fine motor control, Gross motor control, endurance, and UE functional use  IMPAIRMENTS: are limiting patient from ADLs, IADLs, rest and sleep, work, and leisure.   CO-MORBIDITIES: may have co-morbidities  that affects occupational performance. Patient will benefit from skilled OT to address above impairments and improve overall function.  REHAB POTENTIAL: Good   PLAN:  OT FREQUENCY: 2x/week  OT DURATION: 12 weeks  PLANNED INTERVENTIONS: self care/ADL training, therapeutic exercise, therapeutic activity, neuromuscular re-education, manual therapy, passive range of motion, functional mobility training, aquatic therapy, splinting, electrical stimulation, ultrasound, paraffin, fluidotherapy, moist heat, cryotherapy, contrast bath, patient/family education, and DME and/or AE instructions  RECOMMENDED OTHER SERVICES: may have potential for counseling - discussed with patient - he feels he is ok now, but will keep it in mind  CONSULTED AND AGREED WITH PLAN OF CARE: Patient  PLAN FOR NEXT SESSION: Aquatic therapy to address gentle motion / gentle resistance with less influence of gravity.   Gentle wt bearing over UE's in standing w/ modifications to wrist/hand prn Progress gravity eliminated HEP ideas Retrograde massage Consider appropriate compression glove/short duration Ace wrap/shoulder support/taping  Victorino Sparrow, OT 06/10/2023, 2:33 PM

## 2023-06-14 ENCOUNTER — Ambulatory Visit: Payer: BC Managed Care – PPO | Admitting: Student in an Organized Health Care Education/Training Program

## 2023-06-17 ENCOUNTER — Encounter: Payer: Self-pay | Admitting: Neurology

## 2023-06-17 ENCOUNTER — Ambulatory Visit: Payer: BC Managed Care – PPO | Admitting: Occupational Therapy

## 2023-06-17 ENCOUNTER — Ambulatory Visit: Payer: BC Managed Care – PPO | Admitting: Neurology

## 2023-06-17 VITALS — BP 117/81 | HR 93 | Ht 68.0 in | Wt 188.0 lb

## 2023-06-17 DIAGNOSIS — M6281 Muscle weakness (generalized): Secondary | ICD-10-CM

## 2023-06-17 DIAGNOSIS — R600 Localized edema: Secondary | ICD-10-CM | POA: Diagnosis not present

## 2023-06-17 DIAGNOSIS — R278 Other lack of coordination: Secondary | ICD-10-CM

## 2023-06-17 DIAGNOSIS — R29898 Other symptoms and signs involving the musculoskeletal system: Secondary | ICD-10-CM | POA: Diagnosis not present

## 2023-06-17 DIAGNOSIS — M79601 Pain in right arm: Secondary | ICD-10-CM

## 2023-06-17 DIAGNOSIS — G540 Brachial plexus disorders: Secondary | ICD-10-CM | POA: Diagnosis not present

## 2023-06-17 DIAGNOSIS — M792 Neuralgia and neuritis, unspecified: Secondary | ICD-10-CM

## 2023-06-17 DIAGNOSIS — M6259 Muscle wasting and atrophy, not elsewhere classified, multiple sites: Secondary | ICD-10-CM | POA: Diagnosis not present

## 2023-06-17 DIAGNOSIS — M25641 Stiffness of right hand, not elsewhere classified: Secondary | ICD-10-CM | POA: Diagnosis not present

## 2023-06-17 DIAGNOSIS — M79602 Pain in left arm: Secondary | ICD-10-CM

## 2023-06-17 NOTE — Patient Instructions (Signed)
I will contact Dr. Cherylann Ratel and Dr. Katrinka Blazing to discuss your case to see if there is something I can do that would help you.  Please let me know if you have any questions or concerns in the meantime.  The physicians and staff at [facility] are committed to providing excellent care. You may receive a survey requesting feedback about your experience at our office. We strive to receive "very good" responses to the survey questions. If you feel that your experience would prevent you from giving the office a "very good " response, please contact our office to try to remedy the situation. We may be reached at [phone]. Thank you for taking the time out of your busy day to complete the survey.  Jacquelyne Balint, MD [facility]

## 2023-06-17 NOTE — Progress Notes (Signed)
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  HPI: Brian [name] is a 62 y.o. year old right-handed male with a medical history of diabetes, hypothyroidism, lung cancer s/p chemo and radiation, emphysema, SAH, and HLD who we last saw on 04/21/23.  To briefly review: 03/17/23: Patient has pain starting at the base of the neck that radiates mostly into the right arm. He has no use in his 4th and 5th digits. He has a lot of pain in the neck, shoulder, into the elbow, and into the arm. The pain is very severe and worse at night. In the left arm patient has some numbness and pain, most in the forearm. He feels like it is a bad sunburn. He has muscle atrophy and is getting progressively worse. Patient thinks symptoms started in 06/2022 and were mild. Symptoms became severe in 09/2022. The pain is so bad currently that he sleeps only a couple hours per night.   He denies similar symptoms in the past.   He endorses muscle cramps in right triceps mainly. He endorses some fasciculations in his arms. He denies significant low back pain or symptoms in his legs.   The patient denies symptoms suggestive of oculobulbar weakness including diplopia, ptosis, dysphagia, poor saliva control, dysarthria/dysphonia, impaired mastication, facial weakness/droop.   There are no neuromuscular respiratory weakness symptoms, particularly orthopnea>dyspnea.    Pseudobulbar affect is absent.   He does not report any constitutional symptoms like fever, night sweats, anorexia or unintentional weight loss. He does report 10 lbs of weight loss but this is intentional.   EtOH use: 2-3 beers a month  Restrictive diet? No Family history of neuropathy/myopathy/neurologic disease? No   Patient has seen spine and maybe saw neurology in [facility]. He has also been seen at the hand center. He saw PM&R at [facility] and had EMG on 02/12/23 and  02/17/23 that showed right brachial plexopathy (per notes: upper trunk brachial pan plexopathy in the right upper extremity predominantly in the lower trunk distribution). Patient states that he was told he had carpal tunnel and ulnar neuropathy, which is what the LUE report mentioned.   Patient has been prescribed gabapentin 1200 mg at night (sometimes will take 1800 mg). He is not sure it is working. He denies any side effects. He also has baclofen 10 mg TID, but does not really take this. His pain is so bad though, he does not have good relief. He also uses ice and heat. He also had a steroid shot and Toradol that only helped about 2 hours.   Oncology history per Dr. Asa Lente oncology clinic note (12/07/22):   Metastatic non-small cell lung cancer initially diagnosed as stage IIIB (T1c, N3, M0) non-small cell lung cancer, adenocarcinoma presented with right upper lobe lung mass in addition to mediastinal and bilateral supraclavicular lymphadenopathy diagnosed in January 2020.  He had evidence of disease recurrence in June 2021 with adenopathy in the subcarinal, level 3 left neck lymph node, and lymph node at the curious of the diaphragm and in the upper abdomen.    PRIOR THERAPY: 1) Concurrent chemoradiation with weekly carboplatin for AUC of 2 and paclitaxel 45 mg/M2.  Status post 6 cycles.  Last dose was given on October 10, 2018 with stable disease. 2) Radiation treatment to the enlarging right cervical lymph nodes under the care of Dr. Roselind Messier. First treatment 03/06/2019. Last treatment scheduled on 04/13/2019 3) Consolidation  treatment with immunotherapy with Imfinzi 10 mg/KG every 2 weeks.  First dose November 17, 2018.  Status post 26 cycles. 4) Systemic chemotherapy with carboplatin for an AUC of 5, Alimta 500 mg/m2, and Keytruda 200 mg IV every 3 weeks. First dose expected on 02/14/20. Status post 1 cycle.  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 5) SBRT to the left lower lobe lung nodule.   CURRENT THERAPY:  [name] ([name]) 600 mg p.o. twice daily.  He started the first dose on March 08, 2020.   Status post 33 months of treatment.   Per patient, he is currently in remission.  04/21/23: EMG on 03/30/23 did show evidence of bilateral brachial plexopathy with myokymia as would be seen in radiation plexopathy. Patient continued to have significant pain, especially at night. I increased his gabapentin to 600 mg midday and 2400 mg in the evening while continuing Cymbalta 60 mg daily (03/30/23).   Patient's pain has progressively gotten worse. He has a lot of swelling in his hand and feels like it is going to burst. He tried to elevated it last night. The swelling reduced when he did this. He thinks movement and use of arm are still bad but are unchanged. Patient is no longer taking the gabapentin 600 mg during the day, as it did not do much. He continues to take gabapentin 2400 mg in the evening and Cymbalta 60 mg daily.    MRI of brachial plexus on 04/02/23 which was normal of bilateral brachial plexus. Mild teninosis of right supraspinatus tendon was mentioned.   Patient saw Dr. Katrinka Blazing in NSGY on 04/07/23. Patient was referred for scalene block and evaluation of peripheral nerve stimulator. He will be evaluated on 05/04/23.   Patient has started therapy (last week). He has this scheduled through 07/2023.  Most recent Assessment and Plan (04/21/23): This is Brian [name], a 62 y.o. male with bilateral upper extremity pain and weakness. EMG was consistent with a diffuse right brachial plexopathy with myokymic discharges as can be seen in radiation plexopathy (seen in right FPL, biceps, and infraspinatus). His LUE showed abnormal median, ulnar, and radial sensory responses with chronic neurogenic changes seen in the left FDI, FPL (with active changes), and APB, suggesting a possible lower trunk predominant  process. The etiology of symptoms of the left side is less clear though.   Patient continues to have significant pain despite maximum dosages of gabapentin and Cymbalta. We discussed switching to Lyrica, but likely pain management he is seeing on 05/04/23 would have more to offer, including potential nerve block and/or nerve stimulator.    Plan: -Patient would prefer to continue current medications for now: gabapentin 2400 mg at bedtime and Cymbalta 60 mg daily. -See pain management as planned on 05/04/23 -May follow up with Dr. Katrinka Blazing in NSGY as well  Since their last visit: Patient saw Dr. Cherylann Ratel in pain management on 05/04/23. In addition to radiation plexopathy, Dr. Cherylann Ratel also diagnosed complex regional pain syndrome. He recommended decreasing gabapentin to 1200 mg nightly and starting amitriptyline 50 mg at bedtime. He also continued Cymbalta, warning of signs of serotonin syndrome. A diagnostic scalene block was done on 05/12/23. This helped with pain for a little bit. He followed up again on 06/09/23. The plan is to do a right stellate ganglion nerve block later this month. He is sleeping much better, now 6-7 hours at night.  He has  lost function in right arm at times, but at other times feels it has increased range of motion. Overall, he thinks his arm is weaker.  He has tingling in his left arm (4th and 5th digits), but overall feels this arm is similar to prior.  He has no new complaints today.   MEDICATIONS:  Outpatient Encounter Medications as of 06/17/2023  Medication Sig   albuterol (VENTOLIN HFA) 108 (90 Base) MCG/ACT inhaler INHALE 2 PUFFS INTO THE LUNGS EVERY 4 (FOUR) HOURS AS NEEDED FOR WHEEZING OR SHORTNESS OF BREATH.   [name] 150 MG capsule TAKE 4 CAPSULES BY MOUTH TWICE DAILY WITH FOOD. SWALLOW CAPSULES WHOLE. DO NOT OPEN OR CRUSH. STORE IN ORIGINAL CONTAINER   amitriptyline (ELAVIL) 25 MG tablet Take 3 tablets (75 mg total) by mouth at bedtime.   amLODipine (NORVASC) 2.5  MG tablet Take 2.5 mg by mouth daily.   atorvastatin (LIPITOR) 20 MG tablet Take 20 mg by mouth daily.   baclofen (LIORESAL) 10 MG tablet Take 10 mg by mouth daily.   DULoxetine (CYMBALTA) 60 MG capsule Take 1 capsule (60 mg total) by mouth daily.   gabapentin (NEURONTIN) 600 MG tablet Take 1 tablet (600 mg) midday and take 4 tablets (2400 mg) at bedtime (Patient taking differently: Take 1,200 mg by mouth at bedtime. Take 1 tablet (600 mg) midday and take 4 tablets (2400 mg) at bedtime)   naproxen sodium (ALEVE) 220 MG tablet Take 220 mg by mouth.   TRELEGY ELLIPTA 100-62.5-25 MCG/INH AEPB Inhale 1 puff into the lungs daily.   [DISCONTINUED] amitriptyline (ELAVIL) 25 MG tablet Take 1 tablet (25 mg total) by mouth at bedtime for 15 days, THEN 2 tablets (50 mg total) at bedtime.   No facility-administered encounter medications on file as of 06/17/2023.    PAST MEDICAL HISTORY: Past Medical History:  Diagnosis Date   COPD (chronic obstructive pulmonary disease) (HCC)    Diabetes mellitus without complication (HCC)    Hypertension    nscl ca dx'd 08/2018   Pneumonia    Subarachnoid hemorrhage (HCC)     PAST SURGICAL HISTORY: Past Surgical History:  Procedure Laterality Date   INGUINAL HERNIA REPAIR Right 06/24/2020   Procedure: RIGHT OPEN INGUINAL HERNIA REPAIR WITH MESH;  Surgeon: Brian [name], Brian Blanch, Brian [name];  Location: [facility];  Service: General;  Laterality: Right;   IR IVC FILTER PLMT / S&I Brian [name] GUID/MOD SED  03/04/2020   IR IVC FILTER RETRIEVAL / S&I Brian [name] GUID/MOD SED  08/15/2020   IR RADIOLOGIST EVAL & MGMT  08/06/2020   LEG SURGERY  age 87    left leg, from fracture    ALLERGIES: No Known Allergies  FAMILY HISTORY: Family History  Problem Relation Age of Onset   Diabetes Mother    Diabetes Brother    Colon cancer Neg Hx    Stomach cancer Neg Hx    Thyroid cancer Neg Hx     SOCIAL HISTORY: Social History   Tobacco Use   Smoking status: Never   Smokeless tobacco: Never   Vaping Use   Vaping status: Never Used  Substance Use Topics   Alcohol use: Yes    Alcohol/week: 5.0 standard drinks of alcohol    Types: 5 Standard drinks or equivalent per week    Comment: occasionally   Drug use: No   Social History   Social History Narrative   Are you right handed or left handed? Right   Are you currently employed ?    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   Who lives with you? wife   What type of home do you live in: 1 story or 2 story? Two    Caffeine  3 cups a day     Objective:  Vital Signs:  BP 117/81   Pulse 93   Ht 5\' 8"  (1.727 m)   Wt 188 lb (85.3 kg)   SpO2 97%   BMI 28.59 kg/m   General: General appearance: Awake and alert. Appears tired and in pain. Cooperative with exam.  Skin: No obvious rash or jaundice. HEENT: Atraumatic. Anicteric. Lungs: Non-labored breathing on room air  Extremities: Significant swelling of right hand. Musculoskeletal: No obvious joint swelling.  Neurological: Mental Status: Alert. Speech fluent. No pseudobulbar affect Cranial Nerves: CNII: No RAPD. Visual fields intact. CNIII, IV, VI: PERRL. No nystagmus. EOMI. CN V: Facial sensation intact bilaterally to fine touch. CN VII: Facial muscles symmetric and strong. No ptosis at rest. CN VIII: Hears finger rub well bilaterally. CN IX: No hypophonia. CN X: Palate elevates symmetrically. CN XI: Full strength shoulder shrug bilaterally. CN XII: Tongue protrusion full and midline. No atrophy or fasciculations. No significant dysarthria Motor: Flaccid in RUE, otherwise tone normal. Atrophy in right arm (distal > proximal); milder atrophy in left intrinsic hand muscles. No grip or percussive myotonia.  Individual muscle group testing (MRC grade out of 5):  Movement     Neck flexion 5    Neck extension 5     Right Left   Shoulder abduction 0 5   Shoulder adduction 0 5   Shoulder ext rotation 3 5   Shoulder int rotation 1 5    Elbow flexion 0 5   Elbow extension 0 5   Wrist extension 0 5   Wrist flexion 0 5   Finger abduction - FDI 0 4+   Finger abduction - ADM 0 4+   Finger extension 0 4+   Finger distal flexion - 2/3 0 4+   Finger distal flexion - 4/5 0 4+   Thumb flexion - FPL 0 4+   Thumb abduction - APB 0 4    Hip flexion 5 5   Hip extension 5 5   Hip adduction 5 5   Hip abduction 5 5   Knee extension 5 5   Knee flexion 5 5   Dorsiflexion 5 5   Plantarflexion 5 5     Reflexes:  Right Left  Bicep 0 2-3+  Tricep 0 2-3+  BrRad 0 2-3+  Knee 2+ 2+  Ankle 2+ 2+   Pathological Reflexes: Hoffman: absent bilaterally Troemner: absent bilaterally Sensation: Patient does not want pin prick tested. Light touch sensation diminished in right arm compared to left. Gait: Able to rise from chair with arms crossed unassisted. Normal, narrow-based gait.   Lab and Test Review: No new results  Previously reviewed results: B6 (03/17/23) wnl   TSH (11/06/22): elevated to 6.447   External labs: 01/27/23: CMP significant for mildly elevated glucose (112), Tbili (1.1), and Alk phos (124) TSH wnl HbA1c: 5.8 Lipid panel: total cholesterol 151, TG 75, LDL 62   B12 (10/20/21): 921   Imaging: External EMG (02/12/23 - cannot see table only this note): Brian [name] is a 62 y.o. year old male with BUE pain and paresthesias (R > L) referred for electrodiagnostics to evaluate for peripheral nerve entrapment versus brachial plexopathy versus cervical radiculopathy. Patient does have a history of lung  cancer s/p chemotherapy and radiation to the right upper lung field and right neck region. He also has a history of T2DM. Due to the severity of the findings, testing was limited to the right upper extremity today. He will come back next week for left upper extremity testing. Findings were consistent with below:  There is electrodiagnostic evidence of an upper trunk brachial pan plexopathy in the right upper  extremity predominantly in the lower trunk distribution. There is evidence of acute denervation in all right upper extremity muscles tested. This is consistent with the patient's clinical history of radiation. Due to the above plexopathy and diffuse findings in the right upper extremity on EMG, cannot rule out a more diffuse process such as motor neuron disease. Will bring patient back in to test for the left upper extremity next week. While the median nerve conduction studies were abnormal, this may be attributed to the more proximal plexopathy, but cannot definitively rule out a concomitant median neuropathy.    External EMG (02/17/23 - cannot see table only this note): Brian [name] is a 62 y.o. year old male referred for electrodiagnostics, findings consistent with: Acute on chronic left C8 radiculopathy. Severe left-sided carpal tunnel syndrome. Left-sided cubital tunnel syndrome.    MRI brain w/wo contrast (10/15/22): FINDINGS: Brain:   No age advanced or lobar predominant parenchymal atrophy.   Multifocal T2 FLAIR hyperintense signal abnormality within the cerebral white matter, nonspecific but compatible with mild chronic small vessel ischemic disease.   Small chronic infarct within the right cerebellar hemisphere   There is no acute infarct.   No evidence of an intracranial mass.   No chronic intracranial blood products.   No extra-axial fluid collection.   No midline shift.   No pathologic intracranial enhancement identified.   Vascular: Maintained flow voids within the proximal large arterial vessels.   Skull and upper cervical spine: No focal suspicious marrow lesion.   Sinuses/Orbits: No mass or acute finding within the imaged orbits. Partial T2 hyperintense opacification of the right frontal sinus and of an anterior right ethmoid air cell. Mild mucosal thickening scattered elsewhere within bilateral ethmoid air cells. Mild mucosal thickening within the  bilateral maxillary sinuses.   IMPRESSION: 1. No evidence of intracranial metastatic disease. 2. Mild chronic small vessel ischemic changes within the cerebral white matter, similar to the prior brain MRI of 10/09/2021. 3. 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 4. Paranasal sinus disease, as described   MRI right brachial plexus (07/30/22): FINDINGS: Spinal cord   Visualized spinal cord demonstrates normal signal.   Brachial plexus:   Appears normal. No mass impinging on the brachial plexus is identified. No neurogenic tumor is identified.   Muscles and tendons   Normal signal throughout without evidence of denervation atrophy.   Bones   No fracture or worrisome lesion is seen. Small degenerative cyst in the right humeral head is noted.   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 The glenohumeral joint appears normal.   Other findings   Moderately large right pleural effusion appears increased in size compared to the prior chest CT.   IMPRESSION: Normal appearing right brachial plexus.   Moderately large right pleural effusion appears increased in size compared to the patient's 03/30/2022 chest CT.   Acromioclavicular osteoarthritis.   MRI cervical spine wo contrast (07/30/22): FINDINGS: Alignment: Physiologic.   Vertebrae: No fracture, evidence of discitis, or bone lesion.   Cord: Normal signal and morphology.  Posterior Fossa, vertebral arteries, paraspinal tissues: Negative.   Disc levels:   C2-C3: No significant disc bulge. Mild facet arthropathy. No spinal canal stenosis or neural foraminal narrowing.   C3-C4: No significant disc bulge. Left-greater-than-right facet arthropathy. No spinal canal stenosis or neural foraminal narrowing.   C4-C5: No significant disc bulge. Left-greater-than-right facet arthropathy. No spinal canal stenosis or neural foraminal  narrowing.   C5-C6: Minimal disc bulge with right paracentral and subarticular disc protrusion, which indents the thecal sac but does not deform the spinal cord. Facet and uncovertebral hypertrophy. No spinal canal stenosis. Mild right neural foraminal narrowing.   C6-C7: No significant disc bulge. No spinal canal stenosis or neuroforaminal narrowing.   C7-T1: No significant disc bulge. No spinal canal stenosis or neuroforaminal narrowing.   IMPRESSION: 1. C5-C6 mild right neural foraminal narrowing. 2. Multilevel facet arthropathy, which can be a cause of pain.  EMG (03/30/23): NCV & EMG Findings: Extensive electrodiagnostic evaluation of bilateral upper limbs shows: Bilateral median antebrachial cutaneous (MAC) and right lateral antebrachial cutaneous (LAC) sensory responses are absent. Bilateral median, bilateral ulnar, and bilateral radial sensory responses show reduced amplitudes (see table below). Left LAC is present. Bilateral median (APB) motor responses show reduced amplitudes (right 1.17, left 4.0 mV). Right ulnar (ADM) motor response shows reduced amplitude (4.0 mV). Left ulnar (ADM) motor response is within normal limits, but borderline normal (7.3 mV). Chronic motor axon loss changes WITH accompanying active denervation changes are seen in the bilateral flexor pollicis longus, right first dorsal interosseous, right extensor inidicis proprius, abductor pollicis brevis, and triceps muscles. Chronic motor axon loss changes WITHOUT active denervation changes are seen in right biceps, right deltoid, right infraspinatus, left first dorsal interosseous, left extensor indicis proprius, and left abductor pollicis brevis muscles.  Myokymic discharges are seen in the right flexor pollicis longus, right biceps, and right infraspinatus muscles.   Impression: This is an abnormal study. The findings are most consistent with the following: Evidence of a diffuse right brachial plexopathy,  though most prominent in the lower truck, severe in degree electrically. There are myokymic discharges in 3 muscles of the right upper limb, as may be seen in radiation plexopathy. 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 An overlapping left C8-T1 radiculopathy cannot be completely excluded, though normal cervical paraspinal evaluation makes #2 more likely.   MRI brachial plexus w/wo contrast (04/02/23): FINDINGS: Spinal cord   Normal caliber and signal of visualized spinal cord.   Brachial plexus   Roots: Normal.   Trunks: Normal.   Divisions: Normal.   Cords: Normal.   Branches: Normal.   Muscles and tendons   Muscles are normal. No muscle atrophy or muscle edema. Mild tendinosis of the right supraspinatus tendon. Remainder the rotator cuff tendons bilaterally are intact.   Bones   Cervical spine: Mild broad-based disc bulge at C5-6.   C7 transverse processes: Normal.   Marrow signal: Normal.   Other: Mild osteoarthritis of bilateral glenohumeral joints.   Other findings   Moderate right pleural effusion.   IMPRESSION: 1. Normal MRI of the brachial plexus bilaterally. 2. Mild tendinosis of the right supraspinatus tendon. 3. Moderate right pleural effusion.  ASSESSMENT: This is MAVERYCK ALLY, a 62 y.o. male with bilateral upper extremity pain and weakness, right much worse than left. EMG was consistent with a diffuse right brachial plexopathy with myokymic discharges as can be seen in radiation plexopathy (seen in right FPL, biceps, and infraspinatus). His LUE  showed abnormal median, ulnar, and radial sensory responses with chronic neurogenic changes seen in the left FDI, FPL (with active changes), and APB, suggesting a possible lower trunk predominant process vs C8 and T1 radiculopathy. The etiology of symptoms of the left side is less clear though. Overall, symptoms in LUE are stable. His RUE is much weaker  today than previous with little to no movement. While this may be effort or pain limited, he seems to be trying and giving as good an exam as he can. His right hand continues to be very swollen, perhaps with dependent edema. This is a very complex case. He is seeing Dr. Katrinka Blazing in NSGY and Dr. Cherylann Ratel in pain management. He is getting nerve blocks currently for right radiation plexopathy and complex regional pain syndrome, but does not appear to be improving. Patient is very frustrated and is hoping for more aggressive care. On a positive note, he is sleeping better.  Plan: -I will discuss my findings and concerns with Dr. Katrinka Blazing and Dr. Cherylann Ratel. I could repeat EMG if they feel this will be helpful. I do not want to cause patient more patient as he cannot even really tolerate a sensory exam though, if this will not be helpful to the case. -Continue pain meds per Dr. Cherylann Ratel.  -amitriptyline 75 mg at bedtime   -gabapentin 1200 mg at bedtime  -Cymbalta 60 mg daily  Return to clinic in 3 months  Total time spent reviewing records, interview, history/exam, documentation, and coordination of care on day of encounter:  45 min  Jacquelyne Balint, Brian [name]

## 2023-06-17 NOTE — Therapy (Signed)
OUTPATIENT OCCUPATIONAL THERAPY NEURO TREATMENT  Patient Name: Brian [name] MRN: [id number] DOB:1960-11-22, 62 y.o., male Today's Date: 06/17/2023  PCP: Keturah Barre REFERRING PROVIDER: Jacquelyne Balint  END OF SESSION:  OT End of Session - 06/17/23 0802     Visit Number 16    Number of Visits 25    Date for OT Re-Evaluation 06/28/23    Authorization Type [employer]  $1600 deductible has been met  20% coinsurance applies  $8500 oop, zero applied  VL combined with pt, ot, st: 60    OT Start Time 0803    OT Stop Time 0845    OT Time Calculation (min) 42 min    Activity Tolerance Patient tolerated treatment well    Behavior During Therapy WFL for tasks assessed/performed             Past Medical History:  Diagnosis Date   COPD (chronic obstructive pulmonary disease) (HCC)    Diabetes mellitus without complication (HCC)    Hypertension    nscl ca dx'd 08/2018   Pneumonia    Subarachnoid hemorrhage (HCC)    Past Surgical History:  Procedure Laterality Date   INGUINAL HERNIA REPAIR Right 06/24/2020   Procedure: RIGHT OPEN INGUINAL HERNIA REPAIR WITH MESH;  Surgeon: Kinsinger, De Blanch, MD;  Location: [facility];  Service: General;  Laterality: Right;   IR IVC FILTER PLMT / S&I Lenise Arena GUID/MOD SED  03/04/2020   IR IVC FILTER RETRIEVAL / S&I Lenise Arena GUID/MOD SED  08/15/2020   IR RADIOLOGIST EVAL & MGMT  08/06/2020   LEG SURGERY  age 75    left leg, from fracture   Patient Active Problem List   Diagnosis Date Noted   Complex regional pain syndrome type 1 of right upper extremity 05/04/2023   Right arm pain 05/04/2023   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   Recurrent right pleural effusion 07/23/2021   Hypothyroidism 10/24/2020   Acute hypoxemic respiratory failure (HCC) 05/19/2020   Acute respiratory failure with hypoxia (HCC) 05/18/2020   Essential hypertension 05/18/2020   Type 2 diabetes mellitus with hyperlipidemia (HCC) 05/18/2020   S/P thoracentesis 03/21/2020    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/05/2020   Calf swelling 02/21/2020   Rash 02/07/2020   Adenocarcinoma of right lung, stage 4 (HCC) 02/07/2020   Chronic cough 12/20/2019   Venous thrombosis 09/21/2019   Shortness of breath 09/07/2019   Headache 08/09/2019   Lobar pneumonia, unspecified organism (HCC) 03/07/2019   Centrilobular emphysema (HCC) 03/07/2019   Encounter for antineoplastic immunotherapy 11/10/2018   Adenocarcinoma of right lung, stage 3 (HCC) 08/25/2018   Goals of care, counseling/discussion 08/25/2018   Encounter for antineoplastic chemotherapy 08/25/2018   Mass of upper lobe of right lung 08/04/2018   Subarachnoid hemorrhage (HCC) 05/22/2015    ONSET DATE: 03/17/23 Referral  REFERRING DIAG: UE Pain and paresthesias  THERAPY DIAG:  No diagnosis found.  Rationale for Evaluation and Treatment: Rehabilitation  SUBJECTIVE:   SUBJECTIVE STATEMENT:  Pt boutght a [place] [employer] unit and has it to use at work this week. He also got an arm trough with his [employer] for use at work. He got back a little movement back over the past weke.   He has aquatic therapy back on [place].  Hand is sensitive to hot water ie) hot tub but shower is okay.    Pt reports that he has had decreased use of R arm over the last week.  but went to the pool on his own but he was  not able to perform the same motions as his last pool therapy session ie) reaching behind his back/up his back.     Had appt with pain management specialist 11/25.  Had a nerve block and is being set up for another one with an additional procedure to try and   Nerve stimulator possible installed   The shoulder pain is still there... what I'm having now is muscular , tried his sling but didn't notice any improvmeent in sholder pain. Then pins and needles are in the entire arm and that is constant (sometimes better than others but still constant)  Pt accompanied by: self  PERTINENT HISTORY: Lung cancer w/  radiation, [facility] 2016. Pt reports developing symptoms RUE this February and worsening symptoms in Rt hand more recently - MD thinks this is due to radiation approx 4 years ago  PRECAUTIONS: None  WEIGHT BEARING RESTRICTIONS: No  PAIN:  Are you having pain? Yes: NPRS scale: 3/10 Pain location: right arm/shoulder - mostly shoulder but also into hand and also reports it is starting to get tingling on L side as well into his hand Pain description: sharp, stabbing, throbbing, aching Aggravating factors: idle - positional Relieving factors: heat, ice, gabapentin Tingling in fingers is worse 3/10   FALLS: Has patient fallen in last 6 months? Yes. Number of falls 1- fell last night  LIVING ENVIRONMENT: Lives with: lives with their family and lives with their spouse Lives in: House/apartment Stairs: Yes: External: 2 steps; none Has following equipment at home:  has built in shower seat  PLOF: Independent with basic ADLs  PATIENT GOALS: to be able to get back to some level of use of my right hand  OBJECTIVE:   HAND DOMINANCE: Right  ADLs: Overall ADLs: min assist and increased time Transfers/ambulation related to ADLs: Eating: min assist - wife assists with cutting.  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 driving - uses LUE only at this point Handwriting: unable   POSTURE COMMENTS:  forward head and posterior pelvic tilt   ACTIVITY TOLERANCE: Activity tolerance: reports more fatigue due to constant pain   UPPER EXTREMITY ROM:    Active ROM Right eval Left eval  Shoulder flexion 115 unable to maintain ext elbow 140   Shoulder abduction    Shoulder adduction    Shoulder extension    Shoulder internal rotation    Shoulder external rotation    Elbow flexion WFL   Elbow extension WFL   Wrist flexion    Wrist extension 30   Wrist ulnar deviation 10   Wrist radial deviation 5   Wrist pronation    Wrist supination    (Blank rows = not tested)  UPPER EXTREMITY MMT:     MMT Right eval Left eval  Shoulder flexion NT 4-/5  Shoulder abduction    Shoulder adduction    Shoulder extension    Shoulder internal rotation    Shoulder external rotation    Middle trapezius    Lower trapezius    Elbow flexion    Elbow extension    Wrist flexion 3   Wrist extension 3   Wrist ulnar deviation    Wrist radial deviation    Wrist pronation  Wrist supination    (Blank rows = not tested) RIGHT HAND - 15% COMPOSITE FLEXION     - 40% COMPOSITE EXTENSION HAND FUNCTION: Grip strength: Right: UNABLE lbs; Left: 31.9 lbs  COORDINATION: Finger Nose Finger test: Mild undershooting Left - right lacks strength/ proximal control to complete  SENSATION: WFL to testing, but reports heightened sensation - hypersensitive  EDEMA: Atrophy RUE - UPPER ARM, FOREARM  MUSCLE TONE: RUE: Moderate and Hypotonic and LUE: Within functional limits  COGNITION: Overall cognitive status: Within functional limits for tasks assessed.  Reports some concentration deficits which he attributes to pain  VISION: Subjective report: no changes Baseline vision: Wears glasses all the time Visual history:  nothing significant  VISION ASSESSMENT: Not tested  Patient has difficulty with following activities due to following visual impairments: NA  PERCEPTION: WFL  PRAXIS: WFL   TODAY'S TREATMENT:                                                                                                                              DATE:   06/17/23 Manual/TA/TE:  Pt had significant edema in R UE measured to be 22.3 23.8 cm around palm of  hand at beginning of session and 23 cm at end of session s/p elevation, retrograde massage and ROM - passive and active assisted with RUE.   Explored elevation options available online ie) pillows with edges to keep arm in place in addition to review of edema glove options which he has at home but has difficulty applying.  Pt completed and encouraged to continue at home with table slides of BUEs with gravity eliminated plane for shoulder flexion, horizontal abd/add, and elbow flexion/extension.  With support by OT at wrist and elbow, he is able to pull hand towards his body/face and was happy to see some movement as he came in saying he couldn't move his arm anymore.  As edema was addressed, pt able to get trace movement in all UE joints from elbow (flex/ext), forearm (pron/sup), wrist (flex/ext) and digits (flex/ext) although digits very stiff with edema today.   05/31/23 Patient seen for aquatic therapy visit.  Entered and exited the pool independently.  Pain 2/10 at start of session.   Worked on improving range of motion in BUE - more targeted to Right shoulder and elbow.  Supine floatation to address body on arm movement as well as arm/trunk/ head interaction.  Patient with significantly improved GH IR/ER and abduction this session.  Maintaining elbow in extension with less pain than prior sessions.  Patient able to swim in supine - arm stroke of elementary backstroke x 5 reps.    PATIENT EDUCATION: Education details: Retrograde massage Person educated: Patient Education method: Explanation, Demonstration, and Verbal cues Education comprehension: verbalized understanding, returned demonstration, and verbal cues required  HOME EXERCISE PROGRAM: 04/14/23: bed positioning, AA/ROM HEP for RUE 04/22/23: passive stretching HEP for Rt hand, edema management strategies   GOALS: Goals reviewed with patient?  Yes  SHORT TERM GOALS: Target date: 04/30/23  Patient will complete HEP designed to improve  Bilateral shoulder range of motion  Goal status:MET  2.  Patient will complete HEP designed to improve R elbow flex/ext strength  Goal status: MET  3.  Patient will complete HEP designed to improve range of motion for composite flexion and extension in right hand/wrist  Goal status: MET  4.  Patient will demonstrate ability to pick up and retrieve lightweight object from table with RUE - e.g. cell phone  Goal status: IN PROGRESS  5.  Patient will report improved awareness of positional changes/ support techniques to reduce UE pain.    Goal status: MET   6.  Patient will demonstrate 50% composite active flex/ext in right hand  Goal status: IN PROGRESS   LONG TERM GOALS: Target date: 06/28/23  Patient will complete updated HEP designed on UE strengthening without increase in pain  Goal status: IN PROGRESS  2.  Patient will demonstrate 10 lb grip strength R hand  Goal status: IN PROGRESS  3.  Patient will demonstrate low reach RUE to obtain and release a 2 lb object x 5 without dropping  Goal status: IN PROGRESS  4.  Patient will be able to cut his own food with modified independence  Goal status: IN PROGRESS  5.  Patient will be independent with aquatic exercise program addressing BUE active movement and strength  Goal status: IN PROGRESS  6.  Patient will dress himself with modified independence  Goal status: IN PROGRESS  ASSESSMENT:  CLINICAL IMPRESSION: Patient is seen today for occupational therapy treatment for RUE swelling, weakness, pain and sensory changes. Patient currently presents well below baseline level of function with his dominant RUE has significant swelling in R hand today with increased limitations in AROM/use of hand compared to previous session.  Extensive education and assistance provided with retrograde massage with nearly a 1 cm change circumference of her hand over course of session. Pt will benefit from continued skilled OT services in the  outpatient and aquatic settings to work on impairments and help pt return to higher level of functioning as able.    PERFORMANCE DEFICITS: in functional skills including dexterity, proprioception, sensation, tone, ROM, strength, pain, muscle spasms, Fine motor control, Gross motor control, endurance, and UE functional use  IMPAIRMENTS: are limiting patient from ADLs, IADLs, rest and sleep, work, and leisure.   CO-MORBIDITIES: may have co-morbidities  that affects occupational performance. Patient will benefit from skilled OT to address above impairments and improve overall function.  REHAB POTENTIAL: Good   PLAN:  OT FREQUENCY: 2x/week  OT DURATION: 12 weeks  PLANNED INTERVENTIONS: self care/ADL training, therapeutic exercise, therapeutic activity, neuromuscular re-education, manual therapy, passive range of motion, functional mobility training, aquatic therapy, splinting, electrical stimulation, ultrasound, paraffin, fluidotherapy, moist heat, cryotherapy, contrast bath, patient/family education, and DME and/or AE instructions  RECOMMENDED OTHER SERVICES: may have potential for counseling - discussed with patient - he feels he is ok now, but will keep it in mind  CONSULTED AND AGREED WITH PLAN OF CARE: Patient  PLAN FOR NEXT SESSION: Aquatic therapy to address gentle motion / gentle resistance with less influence of gravity.   Gentle wt bearing over UE's in standing w/ modifications to wrist/hand prn Progress gravity eliminated HEP ideas Retrograde massage Consider appropriate compression glove/short duration Ace wrap/shoulder support/taping  Check on shoulder sling  Victorino Sparrow, OT 06/17/2023, 8:03 AM

## 2023-06-17 NOTE — Therapy (Signed)
OUTPATIENT OCCUPATIONAL THERAPY NEURO TREATMENT  Patient Name: Brian [name] MRN: [id number] DOB:[birth year], 62 y.o., male Today's Date: 06/17/2023  PCP: Keturah Barre REFERRING PROVIDER: Jacquelyne Balint  END OF SESSION:  OT End of Session - 06/17/23 0802     Visit Number 16    Number of Visits 25    Date for OT Re-Evaluation 06/28/23    Authorization Type [employer]  $1600 deductible has been met  20% coinsurance applies  $8500 oop, zero applied  VL combined with pt, ot, st: 60    OT Start Time 0803    OT Stop Time 0845    OT Time Calculation (min) 42 min    Equipment Utilized During Treatment resistance band    Activity Tolerance Patient tolerated treatment well    Behavior During Therapy WFL for tasks assessed/performed             Past Medical History:  Diagnosis Date   COPD (chronic obstructive pulmonary disease) (HCC)    Diabetes mellitus without complication (HCC)    Hypertension    nscl ca dx'd 08/2018   Pneumonia    Subarachnoid hemorrhage (HCC)    Past Surgical History:  Procedure Laterality Date   INGUINAL HERNIA REPAIR Right 06/24/2020   Procedure: RIGHT OPEN INGUINAL HERNIA REPAIR WITH MESH;  Surgeon: Kinsinger, De Blanch, MD;  Location: [facility];  Service: General;  Laterality: Right;   IR IVC FILTER PLMT / S&I Lenise Arena GUID/MOD SED  03/04/2020   IR IVC FILTER RETRIEVAL / S&I Lenise Arena GUID/MOD SED  08/15/2020   IR RADIOLOGIST EVAL & MGMT  08/06/2020   LEG SURGERY  age 65    left leg, from fracture   Patient Active Problem List   Diagnosis Date Noted   Complex regional pain syndrome type 1 of right upper extremity 05/04/2023   Right arm pain 05/04/2023   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   Recurrent right pleural effusion 07/23/2021   Hypothyroidism 10/24/2020   Acute hypoxemic respiratory failure (HCC) 05/19/2020   Acute respiratory failure with hypoxia (HCC) 05/18/2020   Essential hypertension 05/18/2020   Type 2 diabetes mellitus with  hyperlipidemia (HCC) 05/18/2020   S/P thoracentesis 03/21/2020   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/05/2020   Calf swelling 02/21/2020   Rash 02/07/2020   Adenocarcinoma of right lung, stage 4 (HCC) 02/07/2020   Chronic cough 12/20/2019   Venous thrombosis 09/21/2019   Shortness of breath 09/07/2019   Headache 08/09/2019   Lobar pneumonia, unspecified organism (HCC) 03/07/2019   Centrilobular emphysema (HCC) 03/07/2019   Encounter for antineoplastic immunotherapy 11/10/2018   Adenocarcinoma of right lung, stage 3 (HCC) 08/25/2018   Goals of care, counseling/discussion 08/25/2018   Encounter for antineoplastic chemotherapy 08/25/2018   Mass of upper lobe of right lung 08/04/2018   Subarachnoid hemorrhage (HCC) 05/22/2015    ONSET DATE: 03/17/23 Referral  REFERRING DIAG: UE Pain and paresthesias  THERAPY DIAG:  Pain in right arm  Edema of hand  Muscle weakness (generalized)  Other lack of coordination  Rationale for Evaluation and Treatment: Rehabilitation  SUBJECTIVE:   SUBJECTIVE STATEMENT:  Pt reported that he brought a [place] NM[employer] unit and has it in the car to use at work this today.  He used his [employer] account to also get an elevated arm trough for his chair at work.  He reported that he had been massaging his arm and noticed the swelling was better with some slight returns in movement back over the past week.  He tried  his sling again but it was too uncomfortable around his neck..   He has aquatic therapy again on Monday.    Had appt with pain management specialist another brachial plexus nerve block and is being set up for and additional procedure - stellate ganglion block with future consideration of nerve stimulator installation also possible.  Pt accompanied by: self  PERTINENT HISTORY: Lung cancer w/ radiation, [facility] 2016. Pt reports developing symptoms RUE this February and worsening symptoms in Rt hand more recently - MD thinks this is due to  radiation approx 4 years ago  PRECAUTIONS: None  WEIGHT BEARING RESTRICTIONS: No  PAIN:  Are you having pain? Yes: NPRS scale: 3/10 Pain location: right arm/shoulder - mostly shoulder but also into hand and also reports it is starting to get tingling on L side as well into his hand Pain description: sharp, stabbing, throbbing, aching Aggravating factors: idle - positional Relieving factors: heat, ice, gabapentin Tingling in fingers is worse 3/10   FALLS: Has patient fallen in last 6 months? Yes. Number of falls 1- fell last night  LIVING ENVIRONMENT: Lives with: lives with their family and lives with their spouse Lives in: House/apartment Stairs: Yes: External: 2 steps; none Has following equipment at home:  has built in shower seat  PLOF: Independent with basic ADLs  PATIENT GOALS: to be able to get back to some level of use of my right hand  OBJECTIVE:   HAND DOMINANCE: Right  ADLs: Overall ADLs: min assist and increased time Transfers/ambulation related to ADLs: Eating: min assist - wife assists with cutting.  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 driving - uses LUE only at this point Handwriting: unable   POSTURE COMMENTS:  forward head and posterior pelvic tilt   ACTIVITY TOLERANCE: Activity tolerance: reports more fatigue due to constant pain   UPPER EXTREMITY ROM:    Active ROM Right eval Left eval  Shoulder flexion 115 unable to maintain ext elbow 140  Shoulder abduction    Shoulder adduction    Shoulder extension    Shoulder internal rotation    Shoulder external rotation    Elbow flexion  WFL   Elbow extension WFL   Wrist flexion    Wrist extension 30   Wrist ulnar deviation 10   Wrist radial deviation 5   Wrist pronation    Wrist supination    (Blank rows = not tested)  UPPER EXTREMITY MMT:     MMT Right eval Left eval  Shoulder flexion NT 4-/5  Shoulder abduction    Shoulder adduction    Shoulder extension    Shoulder internal rotation    Shoulder external rotation    Middle trapezius    Lower trapezius    Elbow flexion    Elbow extension    Wrist flexion 3   Wrist extension 3   Wrist ulnar deviation    Wrist radial deviation    Wrist pronation    Wrist supination    (Blank rows = not tested) RIGHT HAND - 15% COMPOSITE FLEXION     - 40% COMPOSITE EXTENSION HAND FUNCTION: Grip strength: Right: UNABLE lbs; Left: 31.9 lbs  COORDINATION: Finger Nose Finger test: Mild undershooting Left -  right lacks strength/ proximal control to complete  SENSATION: WFL to testing, but reports heightened sensation - hypersensitive  EDEMA: Atrophy RUE - UPPER ARM, FOREARM  MUSCLE TONE: RUE: Moderate and Hypotonic and LUE: Within functional limits  COGNITION: Overall cognitive status: Within functional limits for tasks assessed.  Reports some concentration deficits which he attributes to pain  VISION: Subjective report: no changes Baseline vision: Wears glasses all the time Visual history:  nothing significant  VISION ASSESSMENT: Not tested  Patient has difficulty with following activities due to following visual impairments: NA  PERCEPTION: WFL  PRAXIS: WFL   TODAY'S TREATMENT:                                                                                                                              DATE:   06/17/23 Manual techniques  Pt had significant edema in R UE measured to be 23.8 cm around palm of hand at beginning of last session with drop to 23 cm at end of session s/p elevation, retrograde massage and ROM. Pt still had swelling in R hand  again today although decreased compared to previous session via home management techniques and manual techniques especially retrograde massage in combination with elevation, and in combination with passive and active assisted ROM of RUE. Circumferential hand measurements down to 22.3 cm around his hand upon completion of session.  Neuromuscular Reed: Neuromuscular reeducation provided, including specific visual, tactile and verbal cues and physical assistance to stimulate the neuromuscular system and promote functional ROM, movement and use of RUE. OTR supported RUE with looped resistance band - at elbow and through hand, while pt worked in gravity eliminated plane to move pillow case stretched between both hands.  Pt pleased with self participation in moving arm in gravity eliminated plane with cues to minimize trunk movements, work on tasks with B hands simultaneously and while working on table slides of BUEs.    05/31/23 Patient seen for aquatic therapy visit.  Entered and exited the pool independently.  Pain 2/10 at start of session.   Worked on improving range of motion in BUE - more targeted to Right shoulder and elbow.  Supine floatation to address body on arm movement as well as arm/trunk/ head interaction.  Patient with significantly improved GH IR/ER and abduction this session.  Maintaining elbow in extension with less pain than prior sessions.  Patient able to swim in supine - arm stroke of elementary backstroke x 5 reps.    PATIENT EDUCATION: Education details: Retrograde massage, gravity eliminated ROM Person educated: Patient Education method: Explanation, Demonstration, and Verbal cues Education comprehension: verbalized understanding, returned demonstration, and verbal cues required  HOME EXERCISE PROGRAM: 04/14/23: bed positioning, AA/ROM HEP for RUE 04/22/23: passive stretching HEP for Rt hand, edema management strategies   GOALS: Goals reviewed with patient? Yes  SHORT TERM  GOALS: Target date: 04/30/23  Patient will complete HEP designed to improve Bilateral shoulder range of motion  Goal status:MET  2.  Patient will complete HEP designed to improve R elbow flex/ext strength  Goal status: MET  3.  Patient will complete HEP designed to improve range of motion for composite flexion and extension in right hand/wrist  Goal status: MET  4.  Patient will demonstrate ability to pick up and retrieve lightweight object from table with RUE - e.g. cell phone  Goal status: IN PROGRESS  5.  Patient will report improved awareness of positional changes/ support techniques to reduce UE pain.    Goal status: MET   6.  Patient will demonstrate 50% composite active flex/ext in right hand  Goal status: IN PROGRESS   LONG TERM GOALS: Target date: 06/28/23  Patient will complete updated HEP designed on UE strengthening without increase in pain  Goal status: IN PROGRESS  2.  Patient will demonstrate 10 lb grip strength R hand  Goal status: IN PROGRESS  3.  Patient will demonstrate low reach RUE to obtain and release a 2 lb object x 5 without dropping  Goal status: IN PROGRESS  4.  Patient will be able to cut his own food with modified independence  Goal status: IN PROGRESS  5.  Patient will be independent with aquatic exercise program addressing BUE active movement and strength  Goal status: IN PROGRESS  6.  Patient will dress himself with modified independence  Goal status: IN PROGRESS  ASSESSMENT:  CLINICAL IMPRESSION: Patient is seen today for occupational therapy treatment for RUE swelling, weakness, pain and sensory changes. Patient currently presents well below baseline level of function with his dominant RUE has some decrease in RUE swelling compared to last week but still has severe limitations in AROM/use of R hand/arm compared to previous sessions.  Extensive education and assistance provided with retrograde massage with good patient carryover  s/p receipt of elevated arm trough for work. Pt will benefit from continued skilled OT services in the outpatient and aquatic settings to work on impairments and help pt return to higher level of functioning as able.    PERFORMANCE DEFICITS: in functional skills including dexterity, proprioception, sensation, tone, ROM, strength, pain, muscle spasms, Fine motor control, Gross motor control, endurance, and UE functional use  IMPAIRMENTS: are limiting patient from ADLs, IADLs, rest and sleep, work, and leisure.   CO-MORBIDITIES: may have co-morbidities  that affects occupational performance. Patient will benefit from skilled OT to address above impairments and improve overall function.  REHAB POTENTIAL: Good   PLAN:  OT FREQUENCY: 2x/week  OT DURATION: 12 weeks  PLANNED INTERVENTIONS: self care/ADL training, therapeutic exercise, therapeutic activity, neuromuscular re-education, manual therapy, passive range of motion, functional mobility training, aquatic therapy, splinting, electrical stimulation, ultrasound, paraffin, fluidotherapy, moist heat, cryotherapy, contrast bath, patient/family education, and DME and/or AE instructions  RECOMMENDED OTHER SERVICES: may have potential for counseling - discussed with patient - he feels he is ok now, but will keep it in mind  CONSULTED AND AGREED WITH PLAN OF CARE: Patient  PLAN FOR NEXT SESSION:  Aquatic therapy to address gentle motion / gentle resistance with less influence of gravity.   Gentle wt bearing over UE's in standing w/ modifications to wrist/hand prn Progress gravity eliminated HEP ideas Retrograde massage Consider appropriate compression glove/short duration Ace wrap/shoulder support/taping  Check on shoulder sling options (not arm sling) as pt has some HSA funds he can spend  Victorino Sparrow, OT 06/17/2023, 3:05 PM

## 2023-06-18 ENCOUNTER — Other Ambulatory Visit: Payer: Self-pay

## 2023-06-21 ENCOUNTER — Ambulatory Visit: Payer: BC Managed Care – PPO | Admitting: Occupational Therapy

## 2023-06-21 ENCOUNTER — Encounter: Payer: Self-pay | Admitting: Occupational Therapy

## 2023-06-21 DIAGNOSIS — M79601 Pain in right arm: Secondary | ICD-10-CM

## 2023-06-21 DIAGNOSIS — R278 Other lack of coordination: Secondary | ICD-10-CM

## 2023-06-21 DIAGNOSIS — M6281 Muscle weakness (generalized): Secondary | ICD-10-CM

## 2023-06-21 DIAGNOSIS — M25641 Stiffness of right hand, not elsewhere classified: Secondary | ICD-10-CM

## 2023-06-21 DIAGNOSIS — M79602 Pain in left arm: Secondary | ICD-10-CM

## 2023-06-21 DIAGNOSIS — R600 Localized edema: Secondary | ICD-10-CM

## 2023-06-21 NOTE — Therapy (Signed)
OUTPATIENT OCCUPATIONAL THERAPY NEURO TREATMENT  Patient Name: Brian [name] MRN: [id number] DOB:07-06-1961, 62 y.o., male Today's Date: 06/21/2023  PCP: Keturah Barre REFERRING PROVIDER: Jacquelyne Balint  END OF SESSION:  OT End of Session - 06/21/23 1817     Visit Number 17    Number of Visits 25    Date for OT Re-Evaluation 06/28/23    Authorization Type [employer]  $1600 deductible has been met  20% coinsurance applies  $8500 oop, zero applied  VL combined with pt, ot, st: 60    OT Start Time 1545    OT Stop Time 1610    OT Time Calculation (min) 25 min    Activity Tolerance Patient limited by lethargy    Behavior During Therapy WFL for tasks assessed/performed             Past Medical History:  Diagnosis Date   COPD (chronic obstructive pulmonary disease) (HCC)    Diabetes mellitus without complication (HCC)    Hypertension    nscl ca dx'd 08/2018   Pneumonia    Subarachnoid hemorrhage (HCC)    Past Surgical History:  Procedure Laterality Date   INGUINAL HERNIA REPAIR Right 06/24/2020   Procedure: RIGHT OPEN INGUINAL HERNIA REPAIR WITH MESH;  Surgeon: Kinsinger, De Blanch, MD;  Location: [facility];  Service: General;  Laterality: Right;   IR IVC FILTER PLMT / S&I Lenise Arena GUID/MOD SED  03/04/2020   IR IVC FILTER RETRIEVAL / S&I Lenise Arena GUID/MOD SED  08/15/2020   IR RADIOLOGIST EVAL & MGMT  08/06/2020   LEG SURGERY  age 61    left leg, from fracture   Patient Active Problem List   Diagnosis Date Noted   Complex regional pain syndrome type 1 of right upper extremity 05/04/2023   Right arm pain 05/04/2023   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   Recurrent right pleural effusion 07/23/2021   Hypothyroidism 10/24/2020   Acute hypoxemic respiratory failure (HCC) 05/19/2020   Acute respiratory failure with hypoxia (HCC) 05/18/2020   Essential hypertension 05/18/2020   Type 2 diabetes mellitus with hyperlipidemia (HCC) 05/18/2020   S/P thoracentesis 03/21/2020    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/05/2020   Calf swelling 02/21/2020   Rash 02/07/2020   Adenocarcinoma of right lung, stage 4 (HCC) 02/07/2020   Chronic cough 12/20/2019   Venous thrombosis 09/21/2019   Shortness of breath 09/07/2019   Headache 08/09/2019   Lobar pneumonia, unspecified organism (HCC) 03/07/2019   Centrilobular emphysema (HCC) 03/07/2019   Encounter for antineoplastic immunotherapy 11/10/2018   Adenocarcinoma of right lung, stage 3 (HCC) 08/25/2018   Goals of care, counseling/discussion 08/25/2018   Encounter for antineoplastic chemotherapy 08/25/2018   Mass of upper lobe of right lung 08/04/2018   Subarachnoid hemorrhage (HCC) 05/22/2015    ONSET DATE: 03/17/23 Referral  REFERRING DIAG: UE Pain and paresthesias  THERAPY DIAG:  Pain in right arm  Edema of hand  Other lack of coordination  Muscle weakness (generalized)  Stiffness of right hand, not elsewhere classified  Pain in left arm  Rationale for Evaluation and Treatment: Rehabilitation  SUBJECTIVE:   SUBJECTIVE STATEMENT:  Patient appears to have declining UE function.     Had appt with pain management specialist another brachial plexus nerve block and is being set up for and additional procedure - stellate ganglion block with future consideration of nerve stimulator installation also possible.  Pt accompanied by: self  PERTINENT HISTORY: Lung cancer w/ radiation, SA[facility] 2016. Pt reports developing symptoms RUE this February  and worsening symptoms in Rt hand more recently - MD thinks this is due to radiation approx 4 years ago  PRECAUTIONS: None  WEIGHT BEARING RESTRICTIONS: No  PAIN:  Are you having pain? Yes: NPRS scale: 3/10 Pain location: right arm/shoulder - mostly shoulder but also into hand and also reports it is starting to get tingling on L side as well into his hand Pain description: sharp, stabbing, throbbing, aching Aggravating factors: idle - positional Relieving  factors: heat, ice, gabapentin Tingling in fingers is worse 3/10   FALLS: Has patient fallen in last 6 months? Yes. Number of falls 1- fell last night  LIVING ENVIRONMENT: Lives with: lives with their family and lives with their spouse Lives in: House/apartment Stairs: Yes: External: 2 steps; none Has following equipment at home:  has built in shower seat  PLOF: Independent with basic ADLs  PATIENT GOALS: to be able to get back to some level of use of my right hand  OBJECTIVE:   HAND DOMINANCE: Right  ADLs: Overall ADLs: min assist and increased time Transfers/ambulation related to ADLs: Eating: min assist - wife assists with cutting.  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 driving - uses LUE only at this point Handwriting: unable   POSTURE COMMENTS:  forward head and posterior pelvic tilt   ACTIVITY TOLERANCE: Activity tolerance: reports more fatigue due to constant pain   UPPER EXTREMITY ROM:    Active ROM Right eval Left eval  Shoulder flexion 115 unable to maintain ext elbow 140  Shoulder abduction    Shoulder adduction    Shoulder extension    Shoulder internal rotation    Shoulder external rotation    Elbow flexion WFL   Elbow extension WFL   Wrist flexion    Wrist extension 30   Wrist ulnar deviation 10   Wrist radial deviation 5   Wrist pronation    Wrist supination    (Blank rows = not tested)  UPPER EXTREMITY MMT:     MMT Right eval Left eval  Shoulder flexion NT 4-/5  Shoulder abduction    Shoulder adduction    Shoulder extension    Shoulder internal rotation    Shoulder external  rotation    Middle trapezius    Lower trapezius    Elbow flexion    Elbow extension    Wrist flexion 3   Wrist extension 3   Wrist ulnar deviation    Wrist radial deviation    Wrist pronation    Wrist supination    (Blank rows = not tested) RIGHT HAND - 15% COMPOSITE FLEXION     - 40% COMPOSITE EXTENSION HAND FUNCTION: Grip strength: Right: UNABLE lbs; Left: 31.9 lbs  COORDINATION: Finger Nose Finger test: Mild undershooting Left - right lacks strength/ proximal control to complete  SENSATION: WFL to testing, but reports heightened sensation - hypersensitive  EDEMA: Atrophy RUE - UPPER ARM, FOREARM  MUSCLE TONE: RUE: Moderate and Hypotonic and LUE: Within functional limits  COGNITION: Overall cognitive status: Within functional limits for tasks assessed.  Reports some concentration deficits which he attributes to pain  VISION: Subjective report: no changes Baseline vision: Wears glasses all the time Visual history:  nothing significant  VISION ASSESSMENT: Not tested  Patient has difficulty with following activities due to following visual impairments: NA  PERCEPTION: WFL  PRAXIS: WFL   TODAY'S TREATMENT:                                                                                                                              DATE:  06/21/23: Patient seen for aquatic therapy visit.  Entered and exited the pool independently.  Pain 5/10 at start of session.   Patient reports having less movement in RUE.  Patient with dependent, pitting edema in R hand.  Message sent to pain specialist and Neurologist regarding reduced arm movement and edema.  Attempted supine floatation to allow patient to abd/add arm for swim stroke, however patient fatiguing after ` 5 strokes.  Patient with minimal to no elbow, forearm, wrist motion this session whereas he had motion in previous sessions.    06/17/23 Manual techniques  Pt had significant edema in R UE measured to be 23.8 cm  around palm of hand at beginning of last session with drop to 23 cm at end of session s/p elevation, retrograde massage and ROM. Pt still had swelling in R hand again today although decreased compared to previous session via home management techniques and manual techniques especially retrograde massage in combination with elevation, and in combination with passive and active assisted ROM of RUE. Circumferential hand measurements down to 22.3 cm around his hand upon completion of session.  Neuromuscular Reed: Neuromuscular reeducation provided, including specific visual, tactile and verbal cues and physical assistance to stimulate the neuromuscular system and promote functional ROM, movement and use of RUE. OTR supported RUE with looped resistance band - at elbow and through hand, while pt worked in gravity eliminated plane to move pillow case stretched between both hands.  Pt pleased with self participation in moving arm in gravity eliminated plane with cues to minimize trunk movements, work on tasks with B hands simultaneously and while working on table slides of BUEs.    05/31/23 Patient seen for aquatic therapy visit.  Entered and exited the pool independently.  Pain 2/10 at start of session.   Worked on improving range of motion in BUE - more targeted to Right shoulder and elbow.  Supine floatation to address body on arm movement as well as arm/trunk/ head interaction.  Patient with significantly improved GH IR/ER and abduction this session.  Maintaining elbow in extension with less pain than prior sessions.  Patient able to swim in supine - arm stroke of elementary backstroke x 5 reps.    PATIENT EDUCATION: Education details: Retrograde massage, gravity eliminated ROM Person educated: Patient Education method: Explanation, Demonstration, and Verbal cues Education comprehension: verbalized understanding, returned demonstration, and verbal cues required  HOME EXERCISE PROGRAM: 04/14/23: bed  positioning, AA/ROM HEP for RUE 04/22/23: passive stretching HEP for Rt hand, edema management strategies   GOALS: Goals reviewed with patient? Yes  SHORT TERM GOALS: Target date: 04/30/23  Patient will complete HEP designed to improve Bilateral shoulder range of motion  Goal status:MET  2.  Patient will complete HEP designed to improve R elbow flex/ext strength  Goal status: MET  3.  Patient will complete HEP designed to improve range of motion for composite flexion and extension in right hand/wrist  Goal status: MET  4.  Patient will demonstrate ability to pick up and retrieve lightweight object from table with RUE - e.g. cell phone  Goal status: IN PROGRESS  5.  Patient will report improved awareness of positional changes/ support techniques to reduce UE pain.    Goal status: MET   6.  Patient will demonstrate 50% composite active flex/ext in right hand  Goal status: IN PROGRESS   LONG TERM GOALS: Target date: 06/28/23  Patient will complete updated HEP designed on UE strengthening without increase in pain  Goal status: IN PROGRESS  2.  Patient will demonstrate 10 lb grip strength R hand  Goal status: IN PROGRESS  3.  Patient will demonstrate low reach RUE to obtain and release a 2 lb object x 5 without dropping  Goal status: IN PROGRESS  4.  Patient will be able to cut his own food with modified independence  Goal status: IN PROGRESS  5.  Patient will be independent with aquatic exercise program addressing BUE active movement and strength  Goal status: IN PROGRESS  6.  Patient will dress himself with modified independence  Goal status: IN PROGRESS  ASSESSMENT:  CLINICAL IMPRESSION: Patient is seen today for occupational therapy treatment for RUE swelling, weakness, pain and sensory changes. Patient currently presents well below baseline level of function with his dominant RUE has some decrease in RUE swelling compared to last week but still has severe  limitations in AROM/use of R hand/arm compared to previous sessions.  Extensive education and assistance provided with retrograde massage with good patient carryover s/p receipt of elevated arm trough for work. Pt will benefit from continued skilled OT services in the outpatient and aquatic settings to work on impairments and help pt return to higher level of functioning as able.    PERFORMANCE DEFICITS: in functional skills including dexterity, proprioception, sensation, tone, ROM, strength, pain, muscle spasms, Fine motor control, Gross motor control, endurance, and UE functional use  IMPAIRMENTS: are limiting patient from ADLs, IADLs, rest and sleep, work, and leisure.   CO-MORBIDITIES: may have co-morbidities  that affects occupational performance. Patient will benefit from skilled OT to address above impairments and improve overall function.  REHAB POTENTIAL: Good   PLAN:  OT FREQUENCY: 2x/week  OT DURATION: 12 weeks  PLANNED INTERVENTIONS: self care/ADL training, therapeutic exercise, therapeutic activity, neuromuscular re-education, manual therapy, passive range of motion, functional mobility training, aquatic therapy, splinting, electrical stimulation, ultrasound, paraffin, fluidotherapy, moist heat, cryotherapy, contrast bath, patient/family education, and DME and/or AE instructions  RECOMMENDED OTHER SERVICES: may have potential for counseling - discussed with patient - he feels he is ok now, but will keep it in mind  CONSULTED AND AGREED WITH PLAN OF CARE: Patient  PLAN FOR NEXT SESSION:  Aquatic therapy to address gentle motion / gentle resistance with less influence of gravity.   Gentle wt bearing over UE's in standing w/ modifications to wrist/hand prn Progress gravity eliminated HEP ideas Retrograde massage Consider appropriate compression glove/short duration Ace wrap/shoulder support/taping  Check on shoulder sling options (not arm sling) as pt has some HSA funds he can  spend  [name] Salina, OT 06/21/2023, 6:18 PM

## 2023-06-22 ENCOUNTER — Other Ambulatory Visit: Payer: Self-pay | Admitting: Student in an Organized Health Care Education/Training Program

## 2023-06-22 DIAGNOSIS — G894 Chronic pain syndrome: Secondary | ICD-10-CM

## 2023-06-22 DIAGNOSIS — G90511 Complex regional pain syndrome I of right upper limb: Secondary | ICD-10-CM

## 2023-06-22 DIAGNOSIS — M792 Neuralgia and neuritis, unspecified: Secondary | ICD-10-CM

## 2023-06-22 DIAGNOSIS — G540 Brachial plexus disorders: Secondary | ICD-10-CM

## 2023-06-23 ENCOUNTER — Other Ambulatory Visit: Payer: Self-pay | Admitting: *Deleted

## 2023-06-23 DIAGNOSIS — M792 Neuralgia and neuritis, unspecified: Secondary | ICD-10-CM

## 2023-06-23 DIAGNOSIS — G540 Brachial plexus disorders: Secondary | ICD-10-CM

## 2023-06-23 DIAGNOSIS — G894 Chronic pain syndrome: Secondary | ICD-10-CM

## 2023-06-23 DIAGNOSIS — G90511 Complex regional pain syndrome I of right upper limb: Secondary | ICD-10-CM

## 2023-06-23 MED ORDER — AMITRIPTYLINE HCL 25 MG PO TABS: 75.0000 mg | ORAL_TABLET | Freq: Every day | ORAL | 0 refills | Status: DC

## 2023-06-24 ENCOUNTER — Encounter: Payer: Self-pay | Admitting: Occupational Therapy

## 2023-06-24 ENCOUNTER — Ambulatory Visit: Payer: BC Managed Care – PPO | Admitting: Occupational Therapy

## 2023-06-24 DIAGNOSIS — M79601 Pain in right arm: Secondary | ICD-10-CM

## 2023-06-24 DIAGNOSIS — M25641 Stiffness of right hand, not elsewhere classified: Secondary | ICD-10-CM | POA: Diagnosis not present

## 2023-06-24 DIAGNOSIS — M79602 Pain in left arm: Secondary | ICD-10-CM

## 2023-06-24 DIAGNOSIS — R600 Localized edema: Secondary | ICD-10-CM | POA: Diagnosis not present

## 2023-06-24 DIAGNOSIS — R278 Other lack of coordination: Secondary | ICD-10-CM

## 2023-06-24 DIAGNOSIS — M6281 Muscle weakness (generalized): Secondary | ICD-10-CM | POA: Diagnosis not present

## 2023-06-24 NOTE — Therapy (Signed)
OUTPATIENT OCCUPATIONAL THERAPY NEURO TREATMENT  Patient Name: Brian [name] MRN: [id number] DOB:17-Aug-1960, 62 y.o., male Today's Date: 06/24/2023  PCP: Keturah Barre REFERRING PROVIDER: Jacquelyne Balint  END OF SESSION:  OT End of Session - 06/24/23 0805     Visit Number 18    Number of Visits 25    Date for OT Re-Evaluation 06/28/23    Authorization Type BC[employer]  $1600 deductible has been met  20% coinsurance applies  $8500 oop, zero applied  VL combined with pt, ot, st: 60    OT Start Time 0802    OT Stop Time 0845    OT Time Calculation (min) 43 min    Activity Tolerance Patient limited by lethargy    Behavior During Therapy WFL for tasks assessed/performed             Past Medical History:  Diagnosis Date   COPD (chronic obstructive pulmonary disease) (HCC)    Diabetes mellitus without complication (HCC)    Hypertension    nscl ca dx'd 08/2018   Pneumonia    Subarachnoid hemorrhage (HCC)    Past Surgical History:  Procedure Laterality Date   INGUINAL HERNIA REPAIR Right 06/24/2020   Procedure: RIGHT OPEN INGUINAL HERNIA REPAIR WITH MESH;  Surgeon: Kinsinger, De Blanch, MD;  Location: [facility];  Service: General;  Laterality: Right;   IR IVC FILTER PLMT / S&I Lenise Arena GUID/MOD SED  03/04/2020   IR IVC FILTER RETRIEVAL / S&I Lenise Arena GUID/MOD SED  08/15/2020   IR RADIOLOGIST EVAL & MGMT  08/06/2020   LEG SURGERY  age 39    left leg, from fracture   Patient Active Problem List   Diagnosis Date Noted   Complex regional pain syndrome type 1 of right upper extremity 05/04/2023   Right arm pain 05/04/2023   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   Recurrent right pleural effusion 07/23/2021   Hypothyroidism 10/24/2020   Acute hypoxemic respiratory failure (HCC) 05/19/2020   Acute respiratory failure with hypoxia (HCC) 05/18/2020   Essential hypertension 05/18/2020   Type 2 diabetes mellitus with hyperlipidemia (HCC) 05/18/2020   S/P thoracentesis 03/21/2020    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/05/2020   Calf swelling 02/21/2020   Rash 02/07/2020   Adenocarcinoma of right lung, stage 4 (HCC) 02/07/2020   Chronic cough 12/20/2019   Venous thrombosis 09/21/2019   Shortness of breath 09/07/2019   Headache 08/09/2019   Lobar pneumonia, unspecified organism (HCC) 03/07/2019   Centrilobular emphysema (HCC) 03/07/2019   Encounter for antineoplastic immunotherapy 11/10/2018   Adenocarcinoma of right lung, stage 3 (HCC) 08/25/2018   Goals of care, counseling/discussion 08/25/2018   Encounter for antineoplastic chemotherapy 08/25/2018   Mass of upper lobe of right lung 08/04/2018   Subarachnoid hemorrhage (HCC) 05/22/2015    ONSET DATE: 03/17/23 Referral  REFERRING DIAG: UE Pain and paresthesias  THERAPY DIAG:  Pain in right arm  Edema of hand  Other lack of coordination  Stiffness of right hand, not elsewhere classified  Pain in left arm  Rationale for Evaluation and Treatment: Rehabilitation  SUBJECTIVE:   SUBJECTIVE STATEMENT:  Patient appears to have declining UE function.  Pt has stellate ganglion block this coming Monday. Pt reports he also ordered the shoulder brace.    Had appt with pain management specialist another brachial plexus nerve block and is being set up for and additional procedure - stellate ganglion block with future consideration of nerve stimulator installation also possible.  Pt accompanied by: self  PERTINENT HISTORY: Lung  cancer w/ radiation, [facility] 2016. Pt reports developing symptoms RUE this February and worsening symptoms in Rt hand more recently - MD thinks this is due to radiation approx 4 years ago  PRECAUTIONS: None  WEIGHT BEARING RESTRICTIONS: No  PAIN:  Are you having pain? Yes: NPRS scale: 5/10 - gets worse near end of day Pain location: right arm/shoulder - mostly shoulder but also into hand and also reports it is starting to get tingling on L side as well into his hand Pain  description: sharp, stabbing, throbbing, aching Aggravating factors: idle - positional Relieving factors: heat, ice, gabapentin Tingling in fingers is worse 3/10   FALLS: Has patient fallen in last 6 months? Yes. Number of falls 1- fell last night  LIVING ENVIRONMENT: Lives with: lives with their family and lives with their spouse Lives in: House/apartment Stairs: Yes: External: 2 steps; none Has following equipment at home:  has built in shower seat  PLOF: Independent with basic ADLs  PATIENT GOALS: to be able to get back to some level of use of my right hand  OBJECTIVE:   HAND DOMINANCE: Right  ADLs: Overall ADLs: min assist and increased time Transfers/ambulation related to ADLs: Eating: min assist - wife assists with cutting.  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 driving - uses LUE only at this point Handwriting: unable   POSTURE COMMENTS:  forward head and posterior pelvic tilt   ACTIVITY TOLERANCE: Activity tolerance: reports more fatigue due to constant pain   UPPER EXTREMITY ROM:    Active ROM Right eval Left eval  Shoulder flexion 115 unable to maintain ext elbow 140  Shoulder abduction    Shoulder adduction    Shoulder extension    Shoulder internal rotation    Shoulder external rotation    Elbow flexion WFL   Elbow extension WFL   Wrist flexion    Wrist extension 30   Wrist ulnar deviation 10   Wrist radial deviation 5   Wrist pronation    Wrist supination    (Blank rows = not tested)  UPPER EXTREMITY MMT:     MMT Right eval Left eval  Shoulder flexion NT 4-/5  Shoulder abduction     Shoulder adduction    Shoulder extension    Shoulder internal rotation    Shoulder external rotation    Middle trapezius    Lower trapezius    Elbow flexion    Elbow extension    Wrist flexion 3   Wrist extension 3   Wrist ulnar deviation    Wrist radial deviation    Wrist pronation    Wrist supination    (Blank rows = not tested) RIGHT HAND - 15% COMPOSITE FLEXION     - 40% COMPOSITE EXTENSION HAND FUNCTION: Grip strength: Right: UNABLE lbs; Left: 31.9 lbs  COORDINATION: Finger Nose Finger test: Mild undershooting Left - right lacks strength/ proximal control to complete  SENSATION: WFL to testing, but reports heightened sensation - hypersensitive  EDEMA: Atrophy RUE - UPPER ARM, FOREARM  MUSCLE TONE: RUE: Moderate and Hypotonic and LUE: Within functional limits  COGNITION: Overall cognitive status: Within functional limits for tasks assessed.  Reports some concentration deficits which he attributes to  pain  VISION: Subjective report: no changes Baseline vision: Wears glasses all the time Visual history:  nothing significant  VISION ASSESSMENT: Not tested  Patient has difficulty with following activities due to following visual impairments: NA  PERCEPTION: WFL  PRAXIS: WFL   TODAY'S TREATMENT:                                                                                                                              DATE:   06/24/23:  Discussed current status and decline in shoulder and elbow function. Pt reports getting another ganglion block next week. Pt also ordered shoulder brace. Discussed TENS for shoulder pain, but advised to discuss w/ MD if needed to avoid TENS after ganglion block  Ultrasound x 6 minutes, 3 Mhz, 0.8 wts/cm2, 20% pulsed over dorsal hand for edema management, followed by 4 min. palm of hand same parameters for 10 minutes total.  Table slides in flex/ext and horizontal abd/add w/ decreased ability to perform.  Pt shown light weight  bearing ex standing over table for body on arm movements in A/P wt shifts and lateral wt shifts w/ modifications for wrist/hand Pt then given "scrubbing" ex in standing at counter height with folded towel - pt fatigues quickly and can only tolerate 5 reps before rest break is needed.   06/21/23: Patient seen for aquatic therapy visit.  Entered and exited the pool independently.  Pain 5/10 at start of session.   Patient reports having less movement in RUE.  Patient with dependent, pitting edema in R hand.  Message sent to pain specialist and Neurologist regarding reduced arm movement and edema.  Attempted supine floatation to allow patient to abd/add arm for swim stroke, however patient fatiguing after ` 5 strokes.  Patient with minimal to no elbow, forearm, wrist motion this session whereas he had motion in previous sessions.    06/17/23 Manual techniques  Pt had significant edema in R UE measured to be 23.8 cm around palm of hand at beginning of last session with drop to 23 cm at end of session s/p elevation, retrograde massage and ROM. Pt still had swelling in R hand again today although decreased compared to previous session via home management techniques and manual techniques especially retrograde massage in combination with elevation, and in combination with passive and active assisted ROM of RUE. Circumferential hand measurements down to 22.3 cm around his hand upon completion of session.  Neuromuscular Reed: Neuromuscular reeducation provided, including specific visual, tactile and verbal cues and physical assistance to stimulate the neuromuscular system and promote functional ROM, movement and use of RUE. OTR supported RUE with looped resistance band - at elbow and through hand, while pt worked in gravity eliminated plane to move pillow case stretched between both hands.  Pt pleased with self participation in moving arm in gravity eliminated plane with cues to minimize trunk movements, work on  tasks with B hands simultaneously and while working on table slides  of BUEs.    05/31/23 Patient seen for aquatic therapy visit.  Entered and exited the pool independently.  Pain 2/10 at start of session.   Worked on improving range of motion in BUE - more targeted to Right shoulder and elbow.  Supine floatation to address body on arm movement as well as arm/trunk/ head interaction.  Patient with significantly improved GH IR/ER and abduction this session.  Maintaining elbow in extension with less pain than prior sessions.  Patient able to swim in supine - arm stroke of elementary backstroke x 5 reps.    PATIENT EDUCATION: Education details: Retrograde massage, gravity eliminated ROM Person educated: Patient Education method: Explanation, Demonstration, and Verbal cues Education comprehension: verbalized understanding, returned demonstration, and verbal cues required  HOME EXERCISE PROGRAM: 04/14/23: bed positioning, AA/ROM HEP for RUE 04/22/23: passive stretching HEP for Rt hand, edema management strategies   GOALS: Goals reviewed with patient? Yes  SHORT TERM GOALS: Target date: 04/30/23  Patient will complete HEP designed to improve Bilateral shoulder range of motion  Goal status:MET  2.  Patient will complete HEP designed to improve R elbow flex/ext strength  Goal status: MET  3.  Patient will complete HEP designed to improve range of motion for composite flexion and extension in right hand/wrist  Goal status: MET  4.  Patient will demonstrate ability to pick up and retrieve lightweight object from table with RUE - e.g. cell phone  Goal status: IN PROGRESS  5.  Patient will report improved awareness of positional changes/ support techniques to reduce UE pain.    Goal status: MET   6.  Patient will demonstrate 50% composite active flex/ext in right hand  Goal status: IN PROGRESS   LONG TERM GOALS: Target date: 06/28/23  Patient will complete updated HEP designed on UE  strengthening without increase in pain  Goal status: IN PROGRESS  2.  Patient will demonstrate 10 lb grip strength R hand  Goal status: IN PROGRESS  3.  Patient will demonstrate low reach RUE to obtain and release a 2 lb object x 5 without dropping  Goal status: IN PROGRESS  4.  Patient will be able to cut his own food with modified independence  Goal status: IN PROGRESS  5.  Patient will be independent with aquatic exercise program addressing BUE active movement and strength  Goal status: IN PROGRESS  6.  Patient will dress himself with modified independence  Goal status: IN PROGRESS  ASSESSMENT:  CLINICAL IMPRESSION: Patient is seen today for occupational therapy treatment for RUE swelling, weakness, pain and sensory changes. Patient currently presents well below baseline level of function with his dominant RUE has some decrease in RUE swelling compared to last week but still has severe limitations in AROM/use of R hand/arm compared to previous sessions.   Pt will benefit from continued skilled OT services in the outpatient and aquatic settings to work on impairments and help pt return to higher level of functioning as able.    PERFORMANCE DEFICITS: in functional skills including dexterity, proprioception, sensation, tone, ROM, strength, pain, muscle spasms, Fine motor control, Gross motor control, endurance, and UE functional use  IMPAIRMENTS: are limiting patient from ADLs, IADLs, rest and sleep, work, and leisure.   CO-MORBIDITIES: may have co-morbidities  that affects occupational performance. Patient will benefit from skilled OT to address above impairments and improve overall function.  REHAB POTENTIAL: Good   PLAN:  OT FREQUENCY: 2x/week  OT DURATION: 12 weeks  PLANNED INTERVENTIONS: self care/ADL training,  therapeutic exercise, therapeutic activity, neuromuscular re-education, manual therapy, passive range of motion, functional mobility training, aquatic therapy,  splinting, electrical stimulation, ultrasound, paraffin, fluidotherapy, moist heat, cryotherapy, contrast bath, patient/family education, and DME and/or AE instructions  RECOMMENDED OTHER SERVICES: may have potential for counseling - discussed with patient - he feels he is ok now, but will keep it in mind  CONSULTED AND AGREED WITH PLAN OF CARE: Patient  PLAN FOR NEXT SESSION:  Aquatic therapy to address gentle motion / gentle resistance with less influence of gravity.   Gentle wt bearing over UE's in standing w/ modifications to wrist/hand prn Progress gravity eliminated HEP ideas Retrograde massage Renewal needed at next land session with modified goals d/t change/decline in status RUE  Sheran Lawless, OT 06/24/2023, 8:06 AM

## 2023-06-28 ENCOUNTER — Encounter: Payer: Self-pay | Admitting: *Deleted

## 2023-06-28 ENCOUNTER — Other Ambulatory Visit: Payer: Self-pay | Admitting: *Deleted

## 2023-06-28 ENCOUNTER — Ambulatory Visit
Payer: BC Managed Care – PPO | Attending: Student in an Organized Health Care Education/Training Program | Admitting: Student in an Organized Health Care Education/Training Program

## 2023-06-28 ENCOUNTER — Encounter: Payer: Self-pay | Admitting: Student in an Organized Health Care Education/Training Program

## 2023-06-28 ENCOUNTER — Ambulatory Visit
Admission: RE | Admit: 2023-06-28 | Discharge: 2023-06-28 | Disposition: A | Discharge status: Home / Self Care | Payer: BC Managed Care – PPO | Source: Ambulatory Visit | Attending: Student in an Organized Health Care Education/Training Program | Admitting: Student in an Organized Health Care Education/Training Program

## 2023-06-28 DIAGNOSIS — G90511 Complex regional pain syndrome I of right upper limb: Secondary | ICD-10-CM | POA: Diagnosis not present

## 2023-06-28 DIAGNOSIS — G540 Brachial plexus disorders: Secondary | ICD-10-CM | POA: Diagnosis not present

## 2023-06-28 DIAGNOSIS — G894 Chronic pain syndrome: Secondary | ICD-10-CM | POA: Insufficient documentation

## 2023-06-28 DIAGNOSIS — M792 Neuralgia and neuritis, unspecified: Secondary | ICD-10-CM | POA: Diagnosis not present

## 2023-06-28 DIAGNOSIS — M79601 Pain in right arm: Secondary | ICD-10-CM | POA: Insufficient documentation

## 2023-06-28 MED ORDER — ROPIVACAINE HCL 2 MG/ML IJ SOLN: 4.5000 mL | Freq: Once | INTRAMUSCULAR | Status: DC

## 2023-06-28 MED ORDER — DEXAMETHASONE SODIUM PHOSPHATE 10 MG/ML IJ SOLN
10.0000 mg | Freq: Once | INTRAMUSCULAR | Status: AC
Start: 2023-06-28 — End: 2023-06-28
  Administered 2023-06-28: 10 mg

## 2023-06-28 MED ORDER — IOHEXOL 180 MG/ML  SOLN
10.0000 mL | Freq: Once | INTRAMUSCULAR | Status: AC
  Administered 2023-06-28: 10 mL
  Filled 2023-06-28: qty 20

## 2023-06-28 MED ORDER — BUPIVACAINE-EPINEPHRINE (PF) 0.25% -1:200000 IJ SOLN
9.0000 mL | Freq: Once | INTRAMUSCULAR | Status: DC
  Administered 2023-06-28: 9 mL

## 2023-06-28 MED ORDER — DIAZEPAM 5 MG PO TABS
10.0000 mg | ORAL_TABLET | ORAL | Status: AC
  Administered 2023-06-28: 10 mg via ORAL

## 2023-06-28 MED ORDER — DIAZEPAM 5 MG PO TABS
ORAL_TABLET | ORAL | Status: AC
  Filled 2023-06-28: qty 2

## 2023-06-28 MED ORDER — BACLOFEN 10 MG PO TABS: 10.0000 mg | ORAL_TABLET | Freq: Every day | ORAL | 11 refills | Status: DC

## 2023-06-28 MED ORDER — LIDOCAINE-EPINEPHRINE (PF) 1.5 %-1:200000 IJ SOLN
4.5000 mL | Freq: Once | INTRAMUSCULAR | Status: DC
  Filled 2023-06-28: qty 5

## 2023-06-28 MED ORDER — LIDOCAINE HCL (PF) 2 % IJ SOLN
INTRAMUSCULAR | Status: AC
  Filled 2023-06-28: qty 10

## 2023-06-28 MED ORDER — DEXAMETHASONE SODIUM PHOSPHATE 10 MG/ML IJ SOLN
INTRAMUSCULAR | Status: AC
  Filled 2023-06-28: qty 1

## 2023-06-28 MED ORDER — LIDOCAINE HCL 2 % IJ SOLN
20.0000 mL | Freq: Once | INTRAMUSCULAR | Status: AC
  Administered 2023-06-28: 400 mg

## 2023-06-28 NOTE — Patient Instructions (Signed)
______________________________________________________________________    Post-Procedure Discharge Instructions  Instructions: Apply ice:  Purpose: This will minimize any swelling and discomfort after procedure.  When: Day of procedure, as soon as you get home. How: Fill a plastic sandwich bag with crushed ice. Cover it with a small towel and apply to injection site. How long: (15 min on, 15 min off) Apply for 15 minutes then remove x 15 minutes.  Repeat sequence on day of procedure, until you go to bed. Apply heat:  Purpose: To treat any soreness and discomfort from the procedure. When: Starting the next day after the procedure. How: Apply heat to procedure site starting the day following the procedure. How long: May continue to repeat daily, until discomfort goes away. Food intake: Start with clear liquids (like water) and advance to regular food, as tolerated.  Physical activities: Keep activities to a minimum for the first 8 hours after the procedure. After that, then as tolerated. Driving: If you have received any sedation, be responsible and do not drive. You are not allowed to drive for 24 hours after having sedation. Blood thinner: (Applies only to those taking blood thinners) You may restart your blood thinner 6 hours after your procedure. Insulin: (Applies only to Diabetic patients taking insulin) As soon as you can eat, you may resume your normal dosing schedule. Infection prevention: Keep procedure site clean and dry. Shower daily and clean area with soap and water. Post-procedure Pain Diary: Extremely important that this be done correctly and accurately. Recorded information will be used to determine the next step in treatment. For the purpose of accuracy, follow these rules: Evaluate only the area treated. Do not report or include pain from an untreated area. For the purpose of this evaluation, ignore all other areas of pain, except for the treated area. After your procedure,  avoid taking a long nap and attempting to complete the pain diary after you wake up. Instead, set your alarm clock to go off every hour, on the hour, for the initial 8 hours after the procedure. Document the duration of the numbing medicine, and the relief you are getting from it. Do not go to sleep and attempt to complete it later. It will not be accurate. If you received sedation, it is likely that you were given a medication that may cause amnesia. Because of this, completing the diary at a later time may cause the information to be inaccurate. This information is needed to plan your care. Follow-up appointment: Keep your post-procedure follow-up evaluation appointment after the procedure (usually 2 weeks for most procedures, 6 weeks for radiofrequencies). DO NOT FORGET to bring you pain diary with you.   Expect: (What should I expect to see with my procedure?) From numbing medicine (AKA: Local Anesthetics): Numbness or decrease in pain. You may also experience some weakness, which if present, could last for the duration of the local anesthetic. Onset: Full effect within 15 minutes of injected. Duration: It will depend on the type of local anesthetic used. On the average, 1 to 8 hours.  From steroids (Applies only if steroids were used): Decrease in swelling or inflammation. Once inflammation is improved, relief of the pain will follow. Onset of benefits: Depends on the amount of swelling present. The more swelling, the longer it will take for the benefits to be seen. In some cases, up to 10 days. Duration: Steroids will stay in the system x 2 weeks. Duration of benefits will depend on multiple posibilities including persistent irritating factors. Side-effects: If  present, they may typically last 2 weeks (the duration of the steroids). Frequent: Cramps (if they occur, drink Gatorade and take over-the-counter Magnesium 450-500 mg once to twice a day); water retention with temporary weight gain;  increases in blood sugar; decreased immune system response; increased appetite. Occasional: Facial flushing (red, warm cheeks); mood swings; menstrual changes. Uncommon: Long-term decrease or suppression of natural hormones; bone thinning. (These are more common with higher doses or more frequent use. This is why we prefer that our patients avoid having any injection therapies in other practices.)  Very Rare: Severe mood changes; psychosis; aseptic necrosis. From procedure: Some discomfort is to be expected once the numbing medicine wears off. This should be minimal if ice and heat are applied as instructed.  Call if: (When should I call?) You experience numbness and weakness that gets worse with time, as opposed to wearing off. New onset bowel or bladder incontinence. (Applies only to procedures done in the spine)  Emergency Numbers: Durning business hours (Monday - Thursday, 8:00 AM - 4:00 PM) (Friday, 9:00 AM - 12:00 Noon): [phone] After hours: [phone] NOTE: If you are having a problem and are unable connect with, or to talk to a provider, then go to your nearest urgent care or emergency department. If the problem is serious and urgent, please call 911. ______________________________________________________________________    Stellate Ganglion Block Patient Information  Description: The stellate ganglion is part of a chain of nerves in the neck that innervate the shoulder, arm, and hand.  This chain of nerves lies beside the windpipe.  By injecting local anesthesia along the stellate ganglion, we block the nerve innervation to the arm and hand on that particular side.  Generally only sensory innervation (touch) is blocked and not motor innervation (strength).  This allows pain-free physical therapy, which is vital to many of the conditions that this nerve block helps.   After numbing the skin with local anesthesia, a small needle is passed to the stellate ganglion and the local  anesthesia is again deposited.  The procedure generally takes less than one minute.  Conditions that my be helped with stellate ganglion blockade;  Reflex sympathetic dystrophy (RSD) Postherpetic neuralgia Diabetic neuropathy Pain from nerve lesions or injury Herpes zoster ( shingles Vascular disease Raynaud's disease  Preparation for the injection: Do not eat any solid food or dairy products within 8 hours of your appointment. You may drink clear liquids up to 3 hours before appointment.  Clear liquids include water, black coffee, juice or soda.  No milk or cream please. You may take your regular medication, including pain medications, with a sip of water before you appointment.  Diabetics should hold regular insulin (if taken separately) and take 1/2 normal NPH dose the morning of the procedure.  Carry some sugar containing items with you to your appointment. A drive must accompany you and be prepared to drive you home after your procedure. Bring all your current medications with you. An IV may be inserted and sedation may be given at the discretion of the physician. A blood pressure cuff, EKG and other monitors will often be applied during the procedure.  Some patients may need to have extra oxygen administered for a short period. You will be asked to provide medical information, including your allergies, prior to the procedure.  We must know immediately if you are taking blood thinners (like Coumadin) or if you are allergic to IV iodine contrast (dye).  Possible side-effects  Bleeding from needle site  Blurry vision and droopy eyelid in that eye (temporary) Stuffy nose Temporary shortness of breath Weakness in arm (temporary) Arm may feel warm   Possible complications: All of these are extremely RARE  Seizures Infection Lung puncture Bleeding in the neck Nerve injury  Call if you experience:  Fever/chills Extreme shortness of breath or inability to swallow Redness,  inflammation or drainage from the site Weakness or numbness that last for greater than 24 hours  Please note:  If effective, we may have to do up to 3-5 blocks in a 3-4 week interval with physical therapy (depending on which condition you are being treated for).  After the procedure: You will be observed in the outpatient area and discharged home.  Please consult the discharge instructions given after the procedure for any post-procedure questions.  If you have any questions please call [phone] [facility]

## 2023-06-28 NOTE — Progress Notes (Signed)
Safety precautions to be maintained throughout the outpatient stay will include: orient to surroundings, keep bed in low position, maintain call bell within reach at all times, provide assistance with transfer out of bed and ambulation.  

## 2023-06-28 NOTE — Progress Notes (Signed)
No difficulty swallowing ice chips or chips of water.

## 2023-06-28 NOTE — Progress Notes (Signed)
PROVIDER NOTE: Interpretation of information contained herein should be left to medically-trained personnel. Specific patient instructions are provided elsewhere under "Patient Instructions" section of medical record. This document was created in part using STT-dictation technology, any transcriptional errors that may result from this process are unintentional.  Patient: Brian [name] Type: Established DOB: 20-Jul-1961 MRN: [id number] PCP: [name], MD  Service: Procedure DOS: 06/28/2023 Setting: Ambulatory Location: Ambulatory outpatient facility Delivery: Face-to-face Provider: Edward Jolly, MD Specialty: Interventional Pain Management Specialty designation: 09 Location: Outpatient facility Ref. Prov.: Edward Jolly, MD       Interventional Therapy   Primary Reason for Visit: Interventional Pain Management Treatment. CC: Shoulder Pain (Right /Same s/s on left just not as progressed.)    Procedure:          Anesthesia, Analgesia, Anxiolysis:  Type: Diagnostic Stellate Ganglion Block  #1  Region: Anterior Cervical Level: Chassaignac's tubercle   Laterality: Right Paramedial  Anesthesia: Local (1-2% Lidocaine)  Sedation: Minimal  Guidance: Fluoroscopy           Position: Supine   1. Complex regional pain syndrome type 1 of right upper extremity   2. Radiation-induced brachial plexopathy (RIGHT)   3. 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 Right arm pain   5. Chronic pain syndrome    NAS-11 Pain score:   Pre-procedure: 6 /10   Post-procedure: 4 /10      H&P (Pre-op Assessment):  Brian [name] is a 62 y.o. (year old), male patient, seen today for interventional treatment. He  has a past surgical history that includes Leg Surgery (age 26 ); IR IVC FILTER PLMT / S&I /IMG GUID/MOD SED (03/04/2020); Inguinal hernia repair (Right, 06/24/2020); IR Radiologist Eval & Mgmt (08/06/2020); and IR IVC FILTER RETRIEVAL / S&I /IMG GUID/MOD SED (08/15/2020). Brian [name] has a current  medication list which includes the following prescription(s): albuterol, alecensa, amitriptyline, amlodipine, atorvastatin, baclofen, duloxetine, gabapentin, naproxen sodium, trelegy ellipta, and venlafaxine, and the following Facility-Administered Medications: bupivacaine-epinephrine (pf). His primarily concern today is the Shoulder Pain (Right /Same s/s on left just not as progressed.)  Initial Vital Signs:  Pulse/HCG Rate: (!) 57  Temp: (!) 97.3 F (36.3 C) Resp: 16 BP: 119/85 SpO2: 100 %  BMI: Estimated body mass index is 27.37 kg/m as calculated from the following:   Height as of this encounter: 5\' 8"  (1.727 m).   Weight as of this encounter: 180 lb (81.6 kg).  Risk Assessment: Allergies: Reviewed. He has No Known Allergies.  Allergy Precautions: None required Coagulopathies: Reviewed. None identified.  Blood-thinner therapy: None at this time Active Infection(s): Reviewed. None identified. Brian [name] is afebrile  Site Confirmation: Brian [name] was asked to confirm the procedure and laterality before marking the site Procedure checklist: Completed Consent: Before the procedure and under the influence of no sedative(s), amnesic(s), or anxiolytics, the patient was informed of the treatment options, risks and possible complications. To fulfill our ethical and legal obligations, as recommended by the American Medical Association's Code of Ethics, I have informed the patient of my clinical impression; the nature and purpose of the treatment or procedure; the risks, benefits, and possible complications of the intervention; the alternatives, including doing nothing; the risk(s) and benefit(s) of the alternative treatment(s) or procedure(s); and the risk(s) and benefit(s) of doing nothing. The patient was provided information about the general risks and possible complications associated with the procedure. These may include, but are not limited to: failure to achieve desired goals, infection,  bleeding, organ  or nerve damage, allergic reactions, paralysis, and death. In addition, the patient was informed of those risks and complications associated to Spine-related procedures, such as failure to decrease pain; infection (i.e.: Meningitis, epidural or intraspinal abscess); bleeding (i.e.: epidural hematoma, subarachnoid hemorrhage, or any other type of intraspinal or peri-dural bleeding); organ or nerve damage (i.e.: Any type of peripheral nerve, nerve root, or spinal cord injury) with subsequent damage to sensory, motor, and/or autonomic systems, resulting in permanent pain, numbness, and/or weakness of one or several areas of the body; allergic reactions; (i.e.: anaphylactic reaction); and/or death. Furthermore, the patient was informed of those risks and complications associated with the medications. These include, but are not limited to: allergic reactions (i.e.: anaphylactic or anaphylactoid reaction(s)); adrenal axis suppression; blood sugar elevation that in diabetics may result in ketoacidosis or comma; water retention that in patients with history of congestive heart failure may result in shortness of breath, pulmonary edema, and decompensation with resultant heart failure; weight gain; swelling or edema; medication-induced neural toxicity; particulate matter embolism and blood vessel occlusion with resultant organ, and/or nervous system infarction; and/or aseptic necrosis of one or more joints. Finally, the patient was informed that Medicine is not an exact science; therefore, there is also the possibility of unforeseen or unpredictable risks and/or possible complications that may result in a catastrophic outcome. The patient indicated having understood very clearly. We have given the patient no guarantees and we have made no promises. Enough time was given to the patient to ask questions, all of which were answered to the patient's satisfaction. Brian [name] has indicated that he wanted to  continue with the procedure. Attestation: I, the ordering provider, attest that I have discussed with the patient the benefits, risks, side-effects, alternatives, likelihood of achieving goals, and potential problems during recovery for the procedure that I have provided informed consent. Date  Time: 06/28/2023  1:44 PM   Pre-Procedure Preparation:  Monitoring: As per clinic protocol. Respiration, ETCO2, SpO2, BP, heart rate and rhythm monitor placed and checked for adequate function Safety Precautions: Patient was assessed for positional comfort and pressure points before starting the procedure. Time-out: I initiated and conducted the "Time-out" before starting the procedure, as per protocol. The patient was asked to participate by confirming the accuracy of the "Time Out" information. Verification of the correct person, site, and procedure were performed and confirmed by me, the nursing staff, and the patient. "Time-out" conducted as per Joint Commission's Universal Protocol (UP.01.01.01). Time: 1428 Start Time: 1428 hrs.  Description of Procedure:          Target Area: Chass[place]ac's tubercle at the C6 vertebral body level, lateral to the longus colli muscle and medial to the scalene muscles. Approach: Anterior classic approach. Area Prepped: Entire Anterior Neck Region ChloraPrep (2% chlorhexidine gluconate and 70% isopropyl alcohol) Safety Precautions: Aspiration looking for blood return was conducted prior to all injections. At no point did we inject any substances, as a needle was being advanced. No attempts were made at seeking any paresthesias. Safe injection practices and needle disposal techniques used. Medications properly checked for expiration dates. SDV (single dose vial) medications used. Description of the Procedure: Protocol guidelines were followed. The patient was placed in position over the procedure table. A thin pillow was placed on the shoulders to facilitate extension of  the neck and palpation of the bony landmarks. The head was kept midline with the mouth slightly open to relax the tension on the anterior cervical muscles. Mild hyperextension of the neck was  use to cause the esophagus to mood to midline. The target area was identified and the area prepped in the usual manner. Skin & deeper tissues infiltrated with local anesthetic. Appropriate amount of time allowed to pass for local anesthetics to take effect. The procedure needles were then advanced to the target area. The patient's common carotid artery was retracted laterally away from the site of entry, using my left hand. Once the tubercle was identified it was placed between the index and middle fingers. Using a 22-gauge, 1.5 inch needle the skin was penetrated while an assistant Constant mild negative pressure aspirating in an effort to try to identify any blood from a perforated vessel. Proper needle placement secured. Negative aspiration confirmed. Solution injected in intermittent fashion, asking for systemic symptoms every 0.5cc of injectate. The needles were then removed and the area cleansed, making sure to leave some of the prepping solution back to take advantage of its long term bactericidal properties.  Vitals:   06/28/23 1425 06/28/23 1430 06/28/23 1435 06/28/23 1443  BP: 134/88 (!) 137/91 (!) 133/97 132/85  Pulse: 88 89 92 93  Resp: 16 17 16 14   Temp:      TempSrc:      SpO2: 99% 97% 99% 95%  Weight:      Height:        Start Time: 1428 hrs. End Time: 1435 hrs. Materials:  Needle(s) Type: Spinal Needle Gauge: 22G Length: 3.5-in Medication(s): Please see orders for medications and dosing details.  10 cc solution made of 9 cc of 0.25% bupivacaine with 1-200,000 epi, 1 cc of Decadron 10 mg/cc injected for the right stellate ganglion   Imaging Guidance (Spinal):          Type of Imaging Technique: Fluoroscopy Guidance (Spinal) Indication(s): Fluoroscopy guidance for needle placement to  enhance accuracy in procedures requiring precise needle localization for targeted delivery of medication in or near specific anatomical locations not easily accessible without such real-time imaging assistance. Exposure Time: Please see nurses notes. Contrast: Before injecting any contrast, we confirmed that the patient did not have an allergy to iodine, shellfish, or radiological contrast. Once satisfactory needle placement was completed at the desired level, radiological contrast was injected. Contrast injected under live fluoroscopy. No contrast complications. See chart for type and volume of contrast used. Fluoroscopic Guidance: I was personally present during the use of fluoroscopy. "Tunnel Vision Technique" used to obtain the best possible view of the target area. Parallax error corrected before commencing the procedure. "Direction-depth-direction" technique used to introduce the needle under continuous pulsed fluoroscopy. Once target was reached, antero-posterior, oblique, and lateral fluoroscopic projection used confirm needle placement in all planes. Images permanently stored in EMR. Interpretation: I personally interpreted the imaging intraoperatively. Adequate needle placement confirmed in multiple planes. Appropriate spread of contrast into desired area was observed. No evidence of afferent or efferent intravascular uptake. No intrathecal or subarachnoid spread observed. Permanent images saved into the patient's record.  Antibiotic Prophylaxis:   Anti-infectives (From admission, onward)    None      Indication(s): None identified  Post-operative Assessment:  Post-procedure Vital Signs:  Pulse/HCG Rate: 93  Temp: (!) 97.3 F (36.3 C) Resp: 14 BP: 132/85 SpO2: 95 %  EBL: None  Complications: No immediate post-treatment complications observed by team, or reported by patient.  Note: The patient tolerated the entire procedure well. A repeat set of vitals were taken after the  procedure and the patient was kept under observation following institutional policy, for this type  of procedure. Post-procedural neurological assessment was performed, showing return to baseline, prior to discharge. The patient was provided with post-procedure discharge instructions, including a section on how to identify potential problems. Should any problems arise concerning this procedure, the patient was given instructions to immediately contact us, at any time, without hesitation. In any case, we plan to contact the patient by telephone for a follow-up status report regarding this interventional procedure.  Comments:  No additional relevant information.  Plan of Care (POC)  Orders:  Orders Placed This Encounter  Procedures   DG PAIN CLINIC C-ARM 1-60 MIN NO REPORT    Intraoperative interpretation by procedural physician at [facility].    Standing Status:   Standing    Number of Occurrences:   1    Order Specific Question:   Reason for exam:    Answer:   Assistance in needle guidance and placement for procedures requiring needle placement in or near specific anatomical locations not easily accessible without such assistance.     Medications ordered for procedure: Meds ordered this encounter  Medications   iohexol (OMNIPAQUE) 180 MG/ML injection 10 mL    Must be Myelogram-compatible. If not available, you may substitute with a water-soluble, non-ionic, hypoallergenic, myelogram-compatible radiological contrast medium.   lidocaine (XYLOCAINE) 2 % (with pres) injection 400 mg   diazepam (VALIUM) tablet 10 mg    Make sure Flumazenil is available in the pyxis when using this medication. If oversedation occurs, administer 0.2 mg IV over 15 sec. If after 45 sec no response, administer 0.2 mg again over 1 min; may repeat at 1 min intervals; not to exceed 4 doses (1 mg)   dexamethasone (DECADRON) injection 10 mg   DISCONTD: lidocaine-EPINEPHrine 1.5 %-1:200000 injection 4.5 mL    DISCONTD: ropivacaine (PF) 2 mg/mL (0.2%) (NAROPIN) injection 4.5 mL   bupivacaine-epinephrine (PF) (MARCAINE W/ EPI) 0.25% -1:200000 injection 9 mL   Medications administered: We administered iohexol, lidocaine, diazepam, and dexamethasone.  See the medical record for exact dosing, route, and time of administration.  Follow-up plan:   Return December 17 or 19, for PPE.       Recent Visits Date Type Provider Dept  06/09/23 Office Visit Edward Jolly, MD [facility]  05/12/23 Procedure visit Edward Jolly, MD [facility]  05/04/23 Office Visit Edward Jolly, MD [facility]  Showing recent visits within past 90 days and meeting all other requirements Today's Visits Date Type Provider Dept  06/28/23 Procedure visit Edward Jolly, MD [facility]  Showing today's visits and meeting all other requirements Future Appointments Date Type Provider Dept  07/20/23 Appointment Edward Jolly, MD [facility]  Showing future appointments within next 90 days and meeting all other requirements  Disposition: Discharge home  Discharge (Date  Time): 06/28/2023; 1505 hrs.   Primary Care Physician: [name], MD Location: [facility] Note by: Edward Jolly, MD (TTS technology used. I apologize for any typographical errors that were not detected and corrected.) Date: 06/28/2023; Time: 3:16 PM  Disclaimer:  Medicine is not an Visual merchandiser. The only guarantee in medicine is that nothing is guaranteed. It is important to note that the decision to proceed with this intervention was based on the information collected from the patient. The Data and conclusions were drawn from the patient's questionnaire, the interview, and the physical examination. Because the information was provided in large part by the patient, it cannot be guaranteed that it has not been purposely  or unconsciously manipulated. Every effort has been made  to obtain as much relevant data as possible for this evaluation. It is important to note that the conclusions that lead to this procedure are derived in large part from the available data. Always take into account that the treatment will also be dependent on availability of resources and existing treatment guidelines, considered by other Pain Management Practitioners as being common knowledge and practice, at the time of the intervention. For Medico-Legal purposes, it is also important to point out that variation in procedural techniques and pharmacological choices are the acceptable norm. The indications, contraindications, technique, and results of the above procedure should only be interpreted and judged by a Board-Certified Interventional Pain Specialist with extensive familiarity and expertise in the same exact procedure and technique.

## 2023-06-29 ENCOUNTER — Telehealth: Payer: Self-pay

## 2023-06-29 NOTE — Telephone Encounter (Signed)
Post procedure follow up.  LM 

## 2023-07-15 ENCOUNTER — Encounter: Payer: Self-pay | Admitting: Occupational Therapy

## 2023-07-15 ENCOUNTER — Ambulatory Visit: Payer: BC Managed Care – PPO | Attending: Family Medicine | Admitting: Occupational Therapy

## 2023-07-15 DIAGNOSIS — M79602 Pain in left arm: Secondary | ICD-10-CM | POA: Diagnosis not present

## 2023-07-15 DIAGNOSIS — M6281 Muscle weakness (generalized): Secondary | ICD-10-CM

## 2023-07-15 DIAGNOSIS — R278 Other lack of coordination: Secondary | ICD-10-CM

## 2023-07-15 DIAGNOSIS — R600 Localized edema: Secondary | ICD-10-CM | POA: Diagnosis not present

## 2023-07-15 DIAGNOSIS — M25641 Stiffness of right hand, not elsewhere classified: Secondary | ICD-10-CM

## 2023-07-15 DIAGNOSIS — R29818 Other symptoms and signs involving the nervous system: Secondary | ICD-10-CM | POA: Diagnosis not present

## 2023-07-15 DIAGNOSIS — M79601 Pain in right arm: Secondary | ICD-10-CM

## 2023-07-15 DIAGNOSIS — R29898 Other symptoms and signs involving the musculoskeletal system: Secondary | ICD-10-CM | POA: Diagnosis not present

## 2023-07-15 NOTE — Therapy (Signed)
OUTPATIENT OCCUPATIONAL THERAPY NEURO TREATMENT  Patient Name: Brian [name] MRN: [id number] DOB:03/18/1961, 62 y.o., male Today's Date: 07/15/2023  PCP: Keturah Barre REFERRING PROVIDER: Jacquelyne Balint  END OF SESSION:  OT End of Session - 07/15/23 1018     Visit Number 19    Number of Visits 35    Date for OT Re-Evaluation 09/15/23    Authorization Type [employer]  $1600 deductible has been met  20% coinsurance applies  $8500 oop, zero applied  VL combined with pt, ot, st: 60    Authorization Time Period Renewal completed on 07/15/23    OT Start Time 1015    OT Stop Time 1100    OT Time Calculation (min) 45 min    Activity Tolerance Patient limited by lethargy    Behavior During Therapy WFL for tasks assessed/performed             Past Medical History:  Diagnosis Date   COPD (chronic obstructive pulmonary disease) (HCC)    Diabetes mellitus without complication (HCC)    Hypertension    nscl ca dx'd 08/2018   Pneumonia    Subarachnoid hemorrhage (HCC)    Past Surgical History:  Procedure Laterality Date   INGUINAL HERNIA REPAIR Right 06/24/2020   Procedure: RIGHT OPEN INGUINAL HERNIA REPAIR WITH MESH;  Surgeon: Kinsinger, De Blanch, MD;  Location: [facility];  Service: General;  Laterality: Right;   IR IVC FILTER PLMT / S&I Lenise Arena GUID/MOD SED  03/04/2020   IR IVC FILTER RETRIEVAL / S&I Lenise Arena GUID/MOD SED  08/15/2020   IR RADIOLOGIST EVAL & MGMT  08/06/2020   LEG SURGERY  age 72    left leg, from fracture   Patient Active Problem List   Diagnosis Date Noted   Complex regional pain syndrome type 1 of right upper extremity 05/04/2023   Right arm pain 05/04/2023   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   Recurrent right pleural effusion 07/23/2021   Hypothyroidism 10/24/2020   Acute hypoxemic respiratory failure (HCC) 05/19/2020   Acute respiratory failure with hypoxia (HCC) 05/18/2020   Essential hypertension 05/18/2020   Type 2 diabetes mellitus with hyperlipidemia  (HCC) 05/18/2020   S/P thoracentesis 03/21/2020   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/05/2020   Calf swelling 02/21/2020   Rash 02/07/2020   Adenocarcinoma of right lung, stage 4 (HCC) 02/07/2020   Chronic cough 12/20/2019   Venous thrombosis 09/21/2019   Shortness of breath 09/07/2019   Headache 08/09/2019   Lobar pneumonia, unspecified organism (HCC) 03/07/2019   Centrilobular emphysema (HCC) 03/07/2019   Encounter for antineoplastic immunotherapy 11/10/2018   Adenocarcinoma of right lung, stage 3 (HCC) 08/25/2018   Goals of care, counseling/discussion 08/25/2018   Encounter for antineoplastic chemotherapy 08/25/2018   Mass of upper lobe of right lung 08/04/2018   Subarachnoid hemorrhage (HCC) 05/22/2015    ONSET DATE: 03/17/23 Referral  REFERRING DIAG: UE Pain and paresthesias  As of 06/28/23 visit with Dr. Cherylann Ratel:  Complex regional pain syndrome type 1 of right upper extremity    2. Radiation-induced brachial plexopathy (RIGHT)   3. 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 Right arm pain   5. Chronic pain syndrome     THERAPY DIAG:  Pain in right arm  Edema of hand  Other lack of coordination  Stiffness of right hand, not elsewhere classified  Pain in left arm  Muscle weakness (generalized)  Other symptoms and signs involving the nervous system  Other symptoms and signs involving the musculoskeletal system  Rationale for Evaluation and Treatment: Rehabilitation  SUBJECTIVE:   SUBJECTIVE STATEMENT:  Pt returns after ganglion block on 06/28/23. Pain is much better RUE w/ pain now only in shoulder 2/10 but still has occasional electric current "jolts" t/o RUE. However ROM has declined RUE. I am getting the numbness/tingling now in LUE at tricep, forearm, and extends into Lt hand to middle, ring, and pinky and want to work on LUE more. Decline noted in LUE strength especially distally at hand   Pt accompanied by: self  PERTINENT HISTORY: Lung  cancer w/ radiation, [facility] 2016. Pt reports developing symptoms RUE this February and worsening symptoms in Rt hand more recently - MD thinks this is due to radiation approx 4 years ago  PRECAUTIONS: None  WEIGHT BEARING RESTRICTIONS: No  PAIN:  Are you having pain? Yes: NPRS scale: 2/10 - gets worse near end of day Pain location: right arm/shoulder - mostly shoulder but also into hand and also reports it is starting to get tingling on L side as well into his hand Pain description: sharp, stabbing, throbbing, aching Aggravating factors: idle - positional Relieving factors: heat, ice, gabapentin Tingling in fingers is worse 3/10   FALLS: Has patient fallen in last 6 months? Yes. Number of falls 1  LIVING ENVIRONMENT: Lives with: lives with their family and lives with their spouse Lives in: House/apartment Stairs: Yes: External: 2 steps; none Has following equipment at home:  has built in shower seat  PLOF: Independent with basic ADLs  PATIENT GOALS: to be able to get back to some level of use of my right hand  BELOW OBJECTIVE INFO AT ORIGINAL EVALUATION DATE BUT HAS DECLINED SINCE EVALUATION:   HAND DOMINANCE: Right  ADLs: Overall ADLs: min assist and increased time Transfers/ambulation related to ADLs: Eating: min assist - wife assists with cutting.  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 driving - uses LUE only at this point Handwriting: unable   POSTURE COMMENTS:  forward head and posterior pelvic tilt   ACTIVITY TOLERANCE: Activity tolerance: reports more fatigue due to constant pain   UPPER EXTREMITY ROM:     Active ROM Right eval Left eval  Shoulder flexion 115 unable to maintain ext elbow 140  Shoulder abduction    Shoulder adduction    Shoulder extension    Shoulder internal rotation    Shoulder external rotation    Elbow flexion WFL   Elbow extension WFL   Wrist flexion    Wrist extension 30   Wrist ulnar deviation 10   Wrist radial deviation 5   Wrist pronation    Wrist supination    (Blank rows = not tested)  UPPER EXTREMITY MMT:     MMT Right eval Left eval  Shoulder flexion NT 4-/5  Shoulder abduction    Shoulder adduction    Shoulder extension    Shoulder internal rotation    Shoulder external rotation    Middle trapezius    Lower trapezius    Elbow flexion    Elbow extension    Wrist flexion 3   Wrist extension 3   Wrist ulnar deviation    Wrist radial deviation    Wrist pronation    Wrist  supination    (Blank rows = not tested) RIGHT HAND - 15% COMPOSITE FLEXION     - 40% COMPOSITE EXTENSION HAND FUNCTION: Grip strength: Right: UNABLE lbs; Left: 31.9 lbs  COORDINATION: Finger Nose Finger test: Mild undershooting Left - right lacks strength/ proximal control to complete  SENSATION: WFL to testing, but reports heightened sensation - hypersensitive  EDEMA: Atrophy RUE - UPPER ARM, FOREARM  MUSCLE TONE: RUE: Moderate and Hypotonic and LUE: Within functional limits  COGNITION: Overall cognitive status: Within functional limits for tasks assessed.  Reports some concentration deficits which he attributes to pain  VISION: Subjective report: no changes Baseline vision: Wears glasses all the time Visual history:  nothing significant  VISION ASSESSMENT: Not tested  Patient has difficulty with following activities due to following visual impairments: NA  PERCEPTION: WFL  PRAXIS: WFL   TODAY'S TREATMENT:                                                                                                                              DATE:  07/15/23  *07/15/23 - RENEWAL COMPLETED TODAY - Pt has lost function in RUE and also now strength and hand movement Lt hand (see below)  LUE MMT:  Sh flex and abd approx 4+/5 Sh ER/IR 5/5, biceps 5/5, FA sup/pron 5/5 Triceps 4+/5 Wrist ext 4+/5 Lt Grip strength = 6.8 lbs (declined from initial evaluation from 31 lbs).  Lt hand: Pt can only oppose thumb to first index, long,and ring digits, limited thumb flex and abduction, limited intrinsic movement especially with finger abd/add. Pt also does not have full gross finger extension at PIP joints   RUE: has declined even more since evaluation: limited in gravity elim AA/ROM at shoulder and elbow. Pt has no hand movement and significant edema RT hand, and moderate edema Rt forearm.   *Discussed writing all new goals for renewal period d/t decline and previous goals no longer attainable  Reviewed wt bearing and "scrubbing" exercises issued from last land clinic session as these are still appropriate, however instructed to incorporate LUE into these ex's more aggressively than RUE   PATIENT EDUCATION: Education details: new POC for renewal/recertification period including all new goals Person educated: Patient Education method: Explanation Education comprehension: verbalized understanding  HOME EXERCISE PROGRAM: 04/14/23: bed positioning, AA/ROM HEP for RUE 04/22/23: passive stretching HEP for Rt hand, edema management strategies   GOALS: (SEE NEW STG's and LTG's further below for renewal/recertification period) Goals reviewed with patient? Yes  SHORT TERM GOALS: Target date: 04/30/23  Patient will complete HEP designed to improve Bilateral shoulder range of motion  Goal status:MET  2.  Patient will complete HEP designed to improve R elbow flex/ext strength  Goal status: MET  3.  Patient will complete HEP designed to improve range of motion for composite flexion and extension in right hand/wrist  Goal status: MET  4.  Patient  will demonstrate ability to pick up and retrieve lightweight object  from table with RUE - e.g. cell phone  Goal status: NOT MET   5.  Patient will report improved awareness of positional changes/ support techniques to reduce UE pain.    Goal status: MET   6.  Patient will demonstrate 50% composite active flex/ext in right hand  Goal status: NOT MET - Will continue to work on some active movement as able   LONG TERM GOALS: Target date: 06/28/23  Patient will complete updated HEP designed on UE strengthening without increase in pain  Goal status: IN PROGRESS - Will need modifications due to decline in RUE and even LUE  2.  Patient will demonstrate 10 lb grip strength R hand  Goal status: NOT MET - no active movement Rt hand  3.  Patient will demonstrate low reach RUE to obtain and release a 2 lb object x 5 without dropping  Goal status: NOT MET - pt only has scapula movement and some limited movement in gravity elim AA/ROM   4.  Patient will be able to cut his own food with modified independence  Goal status: NOT MET  5.  Patient will be independent with aquatic exercise program addressing BUE active movement and strength  Goal status: IN PROGRESS  6.  Patient will dress himself with modified independence  Goal status: NOT MET (Pt only doing 20%)     NEW STG's (due 08/15/23)   1.  Pt will demo 90% or greater P/ROM Rt hand  Baseline: 75% - 80% Goal status: INITIAL  2.  Continue to address edema management and pain management strategies in RUE and Rt hand Baseline:  Goal status: IN PROGRESS  3.  Provide HEP for LUE strength and Lt grip strength  Baseline:  Goal status: INITIAL  4.  Pt to verbalize understanding with compensatory strategies to increase independence with ADLS (particularly dressing, cutting food, opening containers/jars) including one handed techniques and A/E recommendations Baseline:  Goal status: INITIAL  NEW LTG's (due 09/15/23)    1.  Pt to  improve grip strength Lt hand by at least 5 lbs  Baseline: 31.9 at initial eval but had declined to 6.8 lbs at recertification Goal status: INITIAL  2.  Improve coordination Lt hand as evidenced by reducing speed on 9 hole peg test by 10 sec or more Baseline:  64 sec on recert date 07/15/23 Goal status: INITIAL  3.  Pt will dress w/ no more than min assist and will cut food mod I level w/ A/E Baseline:  Goal status: INITIAL   4. Pt will be independent with aquatic exercise program addressing BUE active movement and strength    Goal status: IN PROGRESS      ASSESSMENT:  CLINICAL IMPRESSION: Patient is seen today for occupational therapy reassessment for renewal/recertification. Pt has had decline in RUE function w/ limited to no active movement, decreased Rt hand PROM, and increased edema RUE. Pt has also had decline in LUE function including significant decline in grip strength and coordination. Pt has also been diagnosed with complex regional pain syndrome since initial evaluation. Pt with another recent ganglion nerve block on 06/28/23. Renewal completed today with all new STG's and LTG's secondary to most original goals no longer attainable. Pt will benefit from more O.T. to address decline in function, address pain and edema management, and new decline in LUE. Pt will continue to benefit from both clinic land and aquatic therapy  PERFORMANCE DEFICITS: in functional skills including ADLs, IADLs, coordination, dexterity, proprioception, sensation, edema, tone,  ROM, strength, pain, muscle spasms, Fine motor control, Gross motor control, endurance, decreased knowledge of use of DME, and UE functional use  IMPAIRMENTS: are limiting patient from ADLs, IADLs, rest and sleep, work, and leisure.   CO-MORBIDITIES: has co-morbidities such as CRPS  that affects occupational performance. Patient will benefit from skilled OT to address above impairments and improve overall function.  REHAB  POTENTIAL: Good   PLAN:  OT FREQUENCY: 2x/week  OT DURATION: 8 ADDITIONAL weeks  PLANNED INTERVENTIONS: 97168 OT Re-evaluation, 97535 self care/ADL training, 16109 therapeutic exercise, 97530 therapeutic activity, 97112 neuromuscular re-education, 97140 manual therapy, 97113 aquatic therapy, 97035 ultrasound, 97018 paraffin, 60454 fluidotherapy, 97010 moist heat, 97010 cryotherapy, 97034 contrast bath, 97032 electrical stimulation (manual), 97014 electrical stimulation unattended, 97760 Splinting (initial encounter), passive range of motion, functional mobility training, coping strategies training, patient/family education, and DME and/or AE instructions  RECOMMENDED OTHER SERVICES: may have potential for counseling - discussed with patient - he feels he is ok now, but will keep it in mind  CONSULTED AND AGREED WITH PLAN OF CARE: Patient  PLAN FOR NEXT SESSION:  Continue aquatic therapy to address gentle motion / gentle resistance with less influence of gravity - ALSO incorporate LUE into aquatic therapy d/t decline LUE.    Land therapy: Gentle wt bearing over UE's in standing w/ modifications to wrist/hand prn Give info on contrast bath info and also consider compression wrapping for Rt hand LUE strengthening HEP for arm and grip strength  Sheran Lawless, OT 07/15/2023, 11:09 AM

## 2023-07-19 ENCOUNTER — Encounter: Payer: Self-pay | Admitting: Occupational Therapy

## 2023-07-19 ENCOUNTER — Ambulatory Visit: Payer: BC Managed Care – PPO | Admitting: Occupational Therapy

## 2023-07-19 DIAGNOSIS — R278 Other lack of coordination: Secondary | ICD-10-CM

## 2023-07-19 DIAGNOSIS — M6281 Muscle weakness (generalized): Secondary | ICD-10-CM

## 2023-07-19 DIAGNOSIS — R29818 Other symptoms and signs involving the nervous system: Secondary | ICD-10-CM

## 2023-07-19 DIAGNOSIS — R600 Localized edema: Secondary | ICD-10-CM

## 2023-07-19 DIAGNOSIS — M25641 Stiffness of right hand, not elsewhere classified: Secondary | ICD-10-CM

## 2023-07-19 NOTE — Therapy (Signed)
OUTPATIENT OCCUPATIONAL THERAPY NEURO TREATMENT  Patient Name: Brian [name] MRN: [id number] DOB:1961/03/25, 62 y.o., male Today's Date: 07/19/2023  PCP: Keturah Barre REFERRING PROVIDER: Jacquelyne Balint  END OF SESSION:  OT End of Session - 07/19/23 1940     Visit Number 20    Number of Visits 35    Date for OT Re-Evaluation 09/15/23    Authorization Type [employer]  $1600 deductible has been met  20% coinsurance applies  $8500 oop, zero applied  VL combined with pt, ot, st: 60    Authorization Time Period Renewal completed on 07/15/23    OT Start Time 1545    OT Stop Time 1625    OT Time Calculation (min) 40 min    Behavior During Therapy WFL for tasks assessed/performed             Past Medical History:  Diagnosis Date   COPD (chronic obstructive pulmonary disease) (HCC)    Diabetes mellitus without complication (HCC)    Hypertension    nscl ca dx'd 08/2018   Pneumonia    Subarachnoid hemorrhage (HCC)    Past Surgical History:  Procedure Laterality Date   INGUINAL HERNIA REPAIR Right 06/24/2020   Procedure: RIGHT OPEN INGUINAL HERNIA REPAIR WITH MESH;  Surgeon: Kinsinger, De Blanch, MD;  Location: [facility];  Service: General;  Laterality: Right;   IR IVC FILTER PLMT / S&I Lenise Arena GUID/MOD SED  03/04/2020   IR IVC FILTER RETRIEVAL / S&I Lenise Arena GUID/MOD SED  08/15/2020   IR RADIOLOGIST EVAL & MGMT  08/06/2020   LEG SURGERY  age 55    left leg, from fracture   Patient Active Problem List   Diagnosis Date Noted   Complex regional pain syndrome type 1 of right upper extremity 05/04/2023   Right arm pain 05/04/2023   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   Recurrent right pleural effusion 07/23/2021   Hypothyroidism 10/24/2020   Acute hypoxemic respiratory failure (HCC) 05/19/2020   Acute respiratory failure with hypoxia (HCC) 05/18/2020   Essential hypertension 05/18/2020   Type 2 diabetes mellitus with hyperlipidemia (HCC) 05/18/2020   S/P thoracentesis 03/21/2020    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/05/2020   Calf swelling 02/21/2020   Rash 02/07/2020   Adenocarcinoma of right lung, stage 4 (HCC) 02/07/2020   Chronic cough 12/20/2019   Venous thrombosis 09/21/2019   Shortness of breath 09/07/2019   Headache 08/09/2019   Lobar pneumonia, unspecified organism (HCC) 03/07/2019   Centrilobular emphysema (HCC) 03/07/2019   Encounter for antineoplastic immunotherapy 11/10/2018   Adenocarcinoma of right lung, stage 3 (HCC) 08/25/2018   Goals of care, counseling/discussion 08/25/2018   Encounter for antineoplastic chemotherapy 08/25/2018   Mass of upper lobe of right lung 08/04/2018   Subarachnoid hemorrhage (HCC) 05/22/2015    ONSET DATE: 03/17/23 Referral  REFERRING DIAG: UE Pain and paresthesias  As of 06/28/23 visit with Dr. Cherylann Ratel:  Complex regional pain syndrome type 1 of right upper extremity    2. Radiation-induced brachial plexopathy (RIGHT)   3. 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 Right arm pain   5. Chronic pain syndrome     THERAPY DIAG:  Edema of hand  Other lack of coordination  Muscle weakness (generalized)  Stiffness of right hand, not elsewhere classified  Other symptoms and signs involving the nervous system  Rationale for Evaluation and Treatment: Rehabilitation  SUBJECTIVE:   SUBJECTIVE STATEMENT:  Pt returns after ganglion block on 06/28/23. Pain is much better RUE w/ pain now  only in shoulder 2/10 but still has occasional electric current "jolts" t/o RUE. However ROM has declined RUE. I am getting the numbness/tingling now in LUE at tricep, forearm, and extends into Lt hand to middle, ring, and pinky and want to work on LUE more. Decline noted in LUE strength especially distally at hand   Pt accompanied by: self  PERTINENT HISTORY: Lung cancer w/ radiation, [facility] 2016. Pt reports developing symptoms RUE this February and worsening symptoms in Rt hand more recently - MD thinks this is due to  radiation approx 4 years ago  PRECAUTIONS: None  WEIGHT BEARING RESTRICTIONS: No  PAIN:  Are you having pain? Yes: NPRS scale: 2/10 - gets worse near end of day Pain location: right arm/shoulder - mostly shoulder but also into hand and also reports it is starting to get tingling on L side as well into his hand Pain description: sharp, stabbing, throbbing, aching Aggravating factors: idle - positional Relieving factors: heat, ice, gabapentin Tingling in fingers is worse 3/10   FALLS: Has patient fallen in last 6 months? Yes. Number of falls 1  LIVING ENVIRONMENT: Lives with: lives with their family and lives with their spouse Lives in: House/apartment Stairs: Yes: External: 2 steps; none Has following equipment at home:  has built in shower seat  PLOF: Independent with basic ADLs  PATIENT GOALS: to be able to get back to some level of use of my right hand  BELOW OBJECTIVE INFO AT ORIGINAL EVALUATION DATE BUT HAS DECLINED SINCE EVALUATION:   HAND DOMINANCE: Right  ADLs: Overall ADLs: min assist and increased time Transfers/ambulation related to ADLs: Eating: min assist - wife assists with cutting.  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 driving - uses LUE only at this point Handwriting: unable   POSTURE COMMENTS:  forward head and posterior pelvic tilt   ACTIVITY TOLERANCE: Activity tolerance: reports more fatigue due to constant pain   UPPER EXTREMITY ROM:    Active ROM Right eval Left eval  Shoulder flexion 115 unable to maintain ext elbow 140  Shoulder abduction    Shoulder adduction    Shoulder  extension    Shoulder internal rotation    Shoulder external rotation    Elbow flexion WFL   Elbow extension WFL   Wrist flexion    Wrist extension 30   Wrist ulnar deviation 10   Wrist radial deviation 5   Wrist pronation    Wrist supination    (Blank rows = not tested)  UPPER EXTREMITY MMT:     MMT Right eval Left eval  Shoulder flexion NT 4-/5  Shoulder abduction    Shoulder adduction    Shoulder extension    Shoulder internal rotation    Shoulder external rotation    Middle trapezius    Lower trapezius    Elbow flexion    Elbow extension    Wrist flexion 3   Wrist extension 3   Wrist ulnar deviation    Wrist radial deviation    Wrist pronation    Wrist supination    (Blank rows = not tested) RIGHT HAND - 15% COMPOSITE FLEXION     - 40% COMPOSITE EXTENSION HAND FUNCTION: Grip strength: Right:  UNABLE lbs; Left: 31.9 lbs  COORDINATION: Finger Nose Finger test: Mild undershooting Left - right lacks strength/ proximal control to complete  SENSATION: WFL to testing, but reports heightened sensation - hypersensitive  EDEMA: Atrophy RUE - UPPER ARM, FOREARM  MUSCLE TONE: RUE: Moderate and Hypotonic and LUE: Within functional limits  COGNITION: Overall cognitive status: Within functional limits for tasks assessed.  Reports some concentration deficits which he attributes to pain  VISION: Subjective report: no changes Baseline vision: Wears glasses all the time Visual history:  nothing significant  VISION ASSESSMENT: Not tested  Patient has difficulty with following activities due to following visual impairments: NA  PERCEPTION: WFL  PRAXIS: WFL   TODAY'S TREATMENT:                                                                                                                              DATE:  07/19/2023: Patient seen for aquatic therapy visit.  Patient reports significant improvement in RUE pain.  Reports brief "electrical volts" in RUE but improved  sleep, and at times pain free.  Patient continues with significant pitting edema in R hand.   Worked on active movement of R shoulder in gravity reduced env't of pool.  Worked on Emergency planning/management officer with water barbells to address buoyancy assisted and resisted  movements, and water dumbbells.    07/15/23  *07/15/23 - RENEWAL COMPLETED TODAY - Pt has lost function in RUE and also now strength and hand movement Lt hand (see below)  LUE MMT:  Sh flex and abd approx 4+/5 Sh ER/IR 5/5, biceps 5/5, FA sup/pron 5/5 Triceps 4+/5 Wrist ext 4+/5 Lt Grip strength = 6.8 lbs (declined from initial evaluation from 31 lbs).  Lt hand: Pt can only oppose thumb to first index, long,and ring digits, limited thumb flex and abduction, limited intrinsic movement especially with finger abd/add. Pt also does not have full gross finger extension at PIP joints   RUE: has declined even more since evaluation: limited in gravity elim AA/ROM at shoulder and elbow. Pt has no hand movement and significant edema RT hand, and moderate edema Rt forearm.   *Discussed writing all new goals for renewal period d/t decline and previous goals no longer attainable  Reviewed wt bearing and "scrubbing" exercises issued from last land clinic session as these are still appropriate, however instructed to incorporate LUE into these ex's more aggressively than RUE   PATIENT EDUCATION: Education details: new POC for renewal/recertification period including all new goals Person educated: Patient Education method: Explanation Education comprehension: verbalized understanding  HOME EXERCISE PROGRAM: 04/14/23: bed positioning, AA/ROM HEP for RUE 04/22/23: passive stretching HEP for Rt hand, edema management strategies   GOALS: (SEE NEW STG's and LTG's further below for renewal/recertification period) Goals reviewed with patient? Yes  SHORT TERM GOALS: Target date: 04/30/23  Patient will complete HEP designed to improve Bilateral  shoulder range of motion  Goal status:MET  2.  Patient will complete HEP  designed to improve R elbow flex/ext strength  Goal status: MET  3.  Patient will complete HEP designed to improve range of motion for composite flexion and extension in right hand/wrist  Goal status: MET  4.  Patient will demonstrate ability to pick up and retrieve lightweight object from table with RUE - e.g. cell phone  Goal status: NOT MET   5.  Patient will report improved awareness of positional changes/ support techniques to reduce UE pain.    Goal status: MET   6.  Patient will demonstrate 50% composite active flex/ext in right hand  Goal status: NOT MET - Will continue to work on some active movement as able   LONG TERM GOALS: Target date: 06/28/23  Patient will complete updated HEP designed on UE strengthening without increase in pain  Goal status: IN PROGRESS - Will need modifications due to decline in RUE and even LUE  2.  Patient will demonstrate 10 lb grip strength R hand  Goal status: NOT MET - no active movement Rt hand  3.  Patient will demonstrate low reach RUE to obtain and release a 2 lb object x 5 without dropping  Goal status: NOT MET - pt only has scapula movement and some limited movement in gravity elim AA/ROM   4.  Patient will be able to cut his own food with modified independence  Goal status: NOT MET  5.  Patient will be independent with aquatic exercise program addressing BUE active movement and strength  Goal status: IN PROGRESS  6.  Patient will dress himself with modified independence  Goal status: NOT MET (Pt only doing 20%)     NEW STG's (due 08/15/23)   1.  Pt will demo 90% or greater P/ROM Rt hand  Baseline: 75% - 80% Goal status: INITIAL  2.  Continue to address edema management and pain management strategies in RUE and Rt hand Baseline:  Goal status: IN PROGRESS  3.  Provide HEP for LUE strength and Lt grip strength  Baseline:  Goal status:  INITIAL  4.  Pt to verbalize understanding with compensatory strategies to increase independence with ADLS (particularly dressing, cutting food, opening containers/jars) including one handed techniques and A/E recommendations Baseline:  Goal status: INITIAL  NEW LTG's (due 09/15/23)    1.  Pt to improve grip strength Lt hand by at least 5 lbs  Baseline: 31.9 at initial eval but had declined to 6.8 lbs at recertification Goal status: INITIAL  2.  Improve coordination Lt hand as evidenced by reducing speed on 9 hole peg test by 10 sec or more Baseline:  64 sec on recert date 07/15/23 Goal status: INITIAL  3.  Pt will dress w/ no more than min assist and will cut food mod I level w/ A/E Baseline:  Goal status: INITIAL   4. Pt will be independent with aquatic exercise program addressing BUE active movement and strength    Goal status: IN PROGRESS      ASSESSMENT:  CLINICAL IMPRESSION: Patient is seen today for occupational therapy reassessment for renewal/recertification. Pt has had decline in RUE function w/ limited to no active movement, decreased Rt hand PROM, and increased edema RUE. Pt has also had decline in LUE function including significant decline in grip strength and coordination. Pt has also been diagnosed with complex regional pain syndrome since initial evaluation. Pt with another recent ganglion nerve block on 06/28/23. Renewal completed today with all new STG's and LTG's secondary to most original goals no  longer attainable. Pt will benefit from more O.T. to address decline in function, address pain and edema management, and new decline in LUE. Pt will continue to benefit from both clinic land and aquatic therapy  PERFORMANCE DEFICITS: in functional skills including ADLs, IADLs, coordination, dexterity, proprioception, sensation, edema, tone, ROM, strength, pain, muscle spasms, Fine motor control, Gross motor control, endurance, decreased knowledge of use of DME, and UE  functional use  IMPAIRMENTS: are limiting patient from ADLs, IADLs, rest and sleep, work, and leisure.   CO-MORBIDITIES: has co-morbidities such as CRPS  that affects occupational performance. Patient will benefit from skilled OT to address above impairments and improve overall function.  REHAB POTENTIAL: Good   PLAN:  OT FREQUENCY: 2x/week  OT DURATION: 8 ADDITIONAL weeks  PLANNED INTERVENTIONS: 97168 OT Re-evaluation, 97535 self care/ADL training, 16109 therapeutic exercise, 97530 therapeutic activity, 97112 neuromuscular re-education, 97140 manual therapy, 97113 aquatic therapy, 97035 ultrasound, 97018 paraffin, 60454 fluidotherapy, 97010 moist heat, 97010 cryotherapy, 97034 contrast bath, 97032 electrical stimulation (manual), 97014 electrical stimulation unattended, 97760 Splinting (initial encounter), passive range of motion, functional mobility training, coping strategies training, patient/family education, and DME and/or AE instructions  RECOMMENDED OTHER SERVICES: may have potential for counseling - discussed with patient - he feels he is ok now, but will keep it in mind  CONSULTED AND AGREED WITH PLAN OF CARE: Patient  PLAN FOR NEXT SESSION:  Continue aquatic therapy to address gentle motion / gentle resistance with less influence of gravity - ALSO incorporate LUE into aquatic therapy d/t decline LUE.    Land therapy: Gentle wt bearing over UE's in standing w/ modifications to wrist/hand prn Give info on contrast bath info and also consider compression wrapping for Rt hand LUE strengthening HEP for arm and grip strength  [name] Salina, OT 07/19/2023, 7:42 PM

## 2023-07-20 ENCOUNTER — Ambulatory Visit
Payer: BC Managed Care – PPO | Attending: Student in an Organized Health Care Education/Training Program | Admitting: Student in an Organized Health Care Education/Training Program

## 2023-07-20 ENCOUNTER — Encounter: Payer: Self-pay | Admitting: Student in an Organized Health Care Education/Training Program

## 2023-07-20 ENCOUNTER — Ambulatory Visit
Admission: RE | Admit: 2023-07-20 | Discharge: 2023-07-20 | Disposition: A | Discharge status: Home / Self Care | Payer: BC Managed Care – PPO | Source: Ambulatory Visit | Attending: Student in an Organized Health Care Education/Training Program | Admitting: Student in an Organized Health Care Education/Training Program

## 2023-07-20 VITALS — BP 118/79 | HR 89 | Temp 97.0°F | Resp 16 | Ht 68.0 in | Wt 180.0 lb

## 2023-07-20 DIAGNOSIS — G90522 Complex regional pain syndrome I of left lower limb: Secondary | ICD-10-CM | POA: Insufficient documentation

## 2023-07-20 DIAGNOSIS — G540 Brachial plexus disorders: Secondary | ICD-10-CM | POA: Diagnosis not present

## 2023-07-20 DIAGNOSIS — M792 Neuralgia and neuritis, unspecified: Secondary | ICD-10-CM | POA: Insufficient documentation

## 2023-07-20 DIAGNOSIS — G894 Chronic pain syndrome: Secondary | ICD-10-CM | POA: Diagnosis not present

## 2023-07-20 DIAGNOSIS — G90511 Complex regional pain syndrome I of right upper limb: Secondary | ICD-10-CM | POA: Insufficient documentation

## 2023-07-20 NOTE — Patient Instructions (Signed)
Please call [facility] to schedule psych eval, fax results to [facility] Scientific Cervical SCS trial

## 2023-07-20 NOTE — Progress Notes (Signed)
PROVIDER NOTE: Information contained herein reflects review and annotations entered in association with encounter. Interpretation of such information and data should be left to medically-trained personnel. Information provided to patient can be located elsewhere in the medical record under "Patient Instructions". Document created using STT-dictation technology, any transcriptional errors that may result from process are unintentional.    Patient: Brian [name]  Service Category: E/M  Provider: Edward Jolly, MD  DOB: [birth year]  DOS: 07/20/2023  Referring Provider: [name], MD  MRN: [id number]  Specialty: Interventional Pain Management  PCP: [name], MD  Type: Established Patient  Setting: Ambulatory outpatient    Location: Office  Delivery: Face-to-face     HPI  [name], a 62 y.o. year old male, is here today because of his Complex regional pain syndrome type 1 of right upper extremity [G90.511]. Mr. Gebre primary complain today is Arm Pain (Right and left )  Pain Assessment: Severity of Chronic pain is reported as a 1 /10. Location: Arm Right, Left/down into hands and fingers as well as up the arms.. Onset: More than a month ago. Quality: Numbness, Discomfort, Constant, Tingling (when nerves fire the pain is sharp lasting approx 3 seconds, never in same location). Timing: Intermittent. Modifying factor(s): stellate helped with pain. Vitals:  height is 5\' 8"  (1.727 m) and weight is 180 lb (81.6 kg). His temporal temperature is 97 F (36.1 C) (abnormal). His blood pressure is 118/79 and his pulse is 89. His respiration is 16 and oxygen saturation is 99%.  BMI: Estimated body mass index is 27.37 kg/m as calculated from the following:   Height as of this encounter: 5\' 8"  (1.727 m).   Weight as of this encounter: 180 lb (81.6 kg). Last encounter: 05/04/2023. Last procedure: 05/12/2023.  Reason for encounter: post-procedure evaluation and assessment.   Discussed  the use of AI scribe software for clinical note transcription with the patient, who gave verbal consent to proceed.  History of Present Illness   The patient, with a history of Complex Regional Pain Syndrome (CRPS), reports significant improvement in his right-sided pain following a stellate ganglion nerve block. He describes the residual pain as "nerves firing" intermittently in the hand, tricep, bicep, and forearm. The patient interprets this as a sign of active nerves and considers it a positive development.  However, the patient expresses concern about worsening symptoms on the left side, which he describes as "appreciably worse" than during his last visit. He reports difficulty with tasks requiring fine motor skills, such as pulling a car latch, due to weakness. He also describes numbness and a prickly sensation in the tricep, lower forearm, and the middle, ring, and pinky fingers of the left hand. The patient notes visible atrophy in the left hand and believes he is experiencing the same progression of symptoms on the left side as previously experienced on the right.  The patient also reports occasional jolts of pain in the finger, tricep, elbow, and forearm. Despite the discomfort, he views these episodes as positive, interpreting them as signs of nerve activity. He also notes that elevating the affected arm seems to alleviate some of the pain, possibly by preventing blood pooling.        Post-procedure evaluation    Procedure:          Anesthesia, Analgesia, Anxiolysis:  Type: RIGHT INTERSCALENE BLOCK Region: lateral border of the sternocleidomastoid muscle was palpated to identify the inter-scalene groove between the anterior and middle scalene muscles. Laterality: Right-Sided  Anesthesia: Local (1-2% Lidocaine)  Anxiolysis:  Valium   Sedation: None  Guidance: Ultrasound           Position: Supine   1. Radiation-induced brachial plexopathy (RIGHT)   2. 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 Chronic pain syndrome    NAS-11 Pain score:   Pre-procedure: 4 /10   Post-procedure: 5 /10      Effectiveness:  Initial hour after procedure: 90 % Subsequent 4-6 hours post-procedure: 90 %  Analgesia past initial 6 hours: 90 % (nagging pulsing pain is better.  continues weakness on right as well as the left.  left is progressively getting weaker.  periods of the nerve being stimulated causes the nerves to fire and creates the pain. happens multiple times during the day)  Ongoing improvement:  Analgesic: 50% Function: Back to baseline ROM: Back to baseline  ROS  Constitutional: Denies any fever or chills Gastrointestinal: No reported hemesis, hematochezia, vomiting, or acute GI distress Musculoskeletal:  Right hand and forearm swelling, pain, numbness, tingling Neurological: No reported episodes of acute onset apraxia, aphasia, dysarthria, agnosia, amnesia, paralysis, loss of coordination, or loss of consciousness  Medication Review  DULoxetine, Fluticasone-Umeclidin-Vilant, albuterol, alectinib, amLODipine, amitriptyline, atorvastatin, baclofen, gabapentin, naproxen sodium, and venlafaxine  History Review  Allergy: Mr. Westmoreland has no known allergies. Drug: Mr. Horne  reports no history of drug use. Alcohol:  reports current alcohol use of about 5.0 standard drinks of alcohol per week. Tobacco:  reports that he has never smoked. He has never used smokeless tobacco. Social: Mr. Sathre  reports that he has never smoked. He has never used smokeless tobacco. He reports current alcohol use of about 5.0 standard drinks of alcohol per week. He reports that he does not use drugs. Medical:  has a past medical history of COPD (chronic obstructive pulmonary disease) (HCC), Diabetes mellitus without complication (HCC), Hypertension, nscl ca (dx'd 08/2018), Pneumonia, and Subarachnoid hemorrhage  (HCC). Surgical: Mr. Osoria  has a past surgical history that includes Leg Surgery (age 25 ); IR IVC FILTER PLMT / S&I /IMG GUID/MOD SED (03/04/2020); Inguinal hernia repair (Right, 06/24/2020); IR Radiologist Eval & Mgmt (08/06/2020); and IR IVC FILTER RETRIEVAL / S&I /IMG GUID/MOD SED (08/15/2020). Family: family history includes Diabetes in his brother and mother.  Laboratory Chemistry Profile   Renal Lab Results  Component Value Date   BUN 21 (H) 11/06/2022   CREATININE 1.03 11/06/2022   GFRAA >60 05/03/2020   GFRNONAA >60 11/06/2022    Hepatic Lab Results  Component Value Date   AST 31 11/06/2022   ALT 43 11/06/2022   ALBUMIN 4.2 11/06/2022   ALKPHOS 100 11/06/2022   LIPASE 25 10/06/2015    Electrolytes Lab Results  Component Value Date   NA 137 11/06/2022   K 4.2 11/06/2022   CL 104 11/06/2022   CALCIUM 9.0 11/06/2022   MG 2.3 05/20/2020    Bone No results found for: "VD25OH", "VD125OH2TOT", "XL2440NU2", "VO5366YQ0", "25OHVITD1", "25OHVITD2", "25OHVITD3", "TESTOFREE", "TESTOSTERONE"  Inflammation (CRP: Acute Phase) (ESR: Chronic Phase) No results found for: "CRP", "ESRSEDRATE", "LATICACIDVEN"       Note: Above Lab results reviewed.  Recent Imaging Review  DG PAIN CLINIC C-ARM 1-60 MIN NO REPORT Fluoro was used, but no Radiologist interpretation will be provided.  Please refer to "NOTES" tab for provider progress note.  Cervical Imaging: Cervical MR wo contrast: Results for orders placed during the hospital encounter of  07/30/22   MR CERVICAL SPINE WO CONTRAST   Narrative CLINICAL DATA:  Right upper extremity paresthesias; history of radiation therapy for lung cancer   EXAM: MRI CERVICAL SPINE WITHOUT CONTRAST   TECHNIQUE: Multiplanar, multisequence MR imaging of the cervical spine was performed. No intravenous contrast was administered.   COMPARISON:  None Available.   FINDINGS: Alignment: Physiologic.   Vertebrae: No fracture, evidence of discitis, or  bone lesion.   Cord: Normal signal and morphology.   Posterior Fossa, vertebral arteries, paraspinal tissues: Negative.   Disc levels:   C2-C3: No significant disc bulge. Mild facet arthropathy. No spinal canal stenosis or neural foraminal narrowing.   C3-C4: No significant disc bulge. Left-greater-than-right facet arthropathy. No spinal canal stenosis or neural foraminal narrowing.   C4-C5: No significant disc bulge. Left-greater-than-right facet arthropathy. No spinal canal stenosis or neural foraminal narrowing.   C5-C6: Minimal disc bulge with right paracentral and subarticular disc protrusion, which indents the thecal sac but does not deform the spinal cord. Facet and uncovertebral hypertrophy. No spinal canal stenosis. Mild right neural foraminal narrowing.   C6-C7: No significant disc bulge. No spinal canal stenosis or neuroforaminal narrowing.   C7-T1: No significant disc bulge. No spinal canal stenosis or neuroforaminal narrowing.   IMPRESSION: 1. C5-C6 mild right neural foraminal narrowing. 2. Multilevel facet arthropathy, which can be a cause of pain.     Electronically Signed By: Wiliam Ke M.D. On: 08/01/2022 19:19     EMG (03/30/23): NCV & EMG Findings: Extensive electrodiagnostic evaluation of bilateral upper limbs shows: Bilateral median antebrachial cutaneous (MAC) and right lateral antebrachial cutaneous (LAC) sensory responses are absent. Bilateral median, bilateral ulnar, and bilateral radial sensory responses show reduced amplitudes (see table below). Left LAC is present. Bilateral median (APB) motor responses show reduced amplitudes (right 1.17, left 4.0 mV). Right ulnar (ADM) motor response shows reduced amplitude (4.0 mV). Left ulnar (ADM) motor response is within normal limits, but borderline normal (7.3 mV). Chronic motor axon loss changes WITH accompanying active denervation changes are seen in the bilateral flexor pollicis longus, right first  dorsal interosseous, right extensor inidicis proprius, abductor pollicis brevis, and triceps muscles. Chronic motor axon loss changes WITHOUT active denervation changes are seen in right biceps, right deltoid, right infraspinatus, left first dorsal interosseous, left extensor indicis proprius, and left abductor pollicis brevis muscles.  Myokymic discharges are seen in the right flexor pollicis longus, right biceps, and right infraspinatus muscles.   Impression: This is an abnormal study. 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 There are myokymic discharges in 3 muscles of the right upper limb, as may be seen in radiation plexopathy. 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 An overlapping left C8-T1 radiculopathy cannot be completely excluded, though normal cervical paraspinal evaluation makes #2 more likely.   Complexity Note: Imaging results reviewed.                         Note: Reviewed        Physical Exam  General appearance: Well nourished, well developed, and well hydrated. In no apparent acute distress Mental status: Alert, oriented x 3 (person, place, & time)       Respiratory: No evidence of acute respiratory distress Eyes: PERLA Vitals: BP 118/79 (BP Location: Left Arm, Patient Position: Sitting, Cuff Size: Normal)  Pulse 89   Temp (!) 97 F (36.1 C) (Temporal)   Resp 16   Ht 5\' 8"  (1.727 m)   Wt 180 lb (81.6 kg)   SpO2 99%   BMI 27.37 kg/m  BMI: Estimated body mass index is 27.37 kg/m as calculated from the following:   Height as of this encounter: 5\' 8"  (1.727 m).   Weight as of this encounter: 180 lb (81.6 kg). Ideal: Ideal body weight: 68.4 kg (150 lb 12.7 oz) Adjusted ideal body weight: 73.7 kg (162 lb 7.6 oz)  Cervical Spine Area Exam  Skin & Axial Inspection: No masses,  redness, edema, swelling, or associated skin lesions Alignment: Symmetrical Functional ROM: Unrestricted ROM      Stability: No instability detected Muscle Tone/Strength: Functionally intact. No obvious neuro-muscular anomalies detected. Sensory (Neurological): Neurogenic pain pattern Palpation: No palpable anomalies             Upper Extremity (UE) Exam      Side: Right upper extremity   Side: Left upper extremity  Skin & Extremity Inspection: Edema, color change, allodynia   Skin & Extremity Inspection: allodynia, color change, mild edema  Functional ROM: Pain restricted ROM for all joints of upper extremity   Functional ROM: Pain restricted ROM          Muscle Tone/Strength: 1/5    Muscle Tone/Strength: 4/5  Sensory (Neurological): Neurogenic pain pattern           Sensory (Neurological): neurogenic     Palpation: Hypothermic               Palpation: No palpable anomalies              Provocative Test(s):  Phalen's test: deferred Tinel's test: deferred Apley's scratch test (touch opposite shoulder):  Action 1 (Across chest): deferred Action 2 (Overhead): deferred Action 3 (LB reach): deferred     Provocative Test(s):  Phalen's test: deferred Tinel's test: deferred Apley's scratch test (touch opposite shoulder):  Action 1 (Across chest): deferred Action 2 (Overhead): deferred Action 3 (LB reach): deferred    Assessment   Diagnosis Status  1. Complex regional pain syndrome type 1 of right upper extremity   2. Complex regional pain syndrome type 1 of left lower extremity   3. Radiation-induced brachial plexopathy (RIGHT)   4. 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 Chronic pain syndrome   6. Radiation-induced brachial plexopathy    Persistent Persistent Persistent    Plan of Care   Assessment and Plan    Complex Regional Pain Syndrome (CRPS) Severe CRPS has progressed from the right side to now affect his left tricep, forearm, and hand, presenting with pain, numbness,  motor dysfunction, atrophy, and shiny skin. He has achieved significant pain relief from previous stellate ganglion nerve blocks, though intermittent nerve firing persists. The deterioration of his condition impedes daily activities and quality of life. We discussed the potential benefits of a cervical spinal cord stimulator for long-term pain relief across both arms, including a trial phase that could reduce pain by 50% and enhance motor function. We detailed the procedure steps, such as moderate sedation, post-procedure care, and the necessity of a psych evaluation as mandated by insurance. He was informed about the potential for improved motor function with effective pain management. We will order an x-ray and thoracic MRI of the spine, schedule a psych evaluation for the cervical spinal cord stimulator trial.      I explained to pt how spinal cord stimulation (  SCS) is indicated for chronic, intractable pain, especially in cases of failed back surgery syndrome, radiculopathy, or pain syndromes.  SCS devices provide continuous electrical impulses to the spinal cord, modulating pain signals before they reach the brain.  We discussed the potential benefits and risks of spinal cord stimulation. Benefits: can provide substantial relief from chronic pain, long-term therapy with programmable settings, often reduces the need for oral medications, including opioids, some patients experience reduced dependency on other pain interventions. Risks (including but not limited to): Surgical risks, including infection, lead migration/fracture, and hardware malfunction, long-term implantation requires ongoing maintenance and possible reoperation, variable effectiveness: some patients do not experience sufficient pain relief.   Pharmacotherapy (Medications Ordered): No orders of the defined types were placed in this encounter.  Orders:  Orders Placed This Encounter  Procedures   [facility] TRIAL    Contact medical implant  company representative to notify them of the scheduled case and to make sure they will be available to provide required equipment.    Standing Status:   Future    Expiration Date:   10/18/2023    Scheduling Instructions:     Side: Bilateral     Level: CERVICAL     Device: [employer]     Sedation: With sedation     Timeframe: As soon as pre-approved    Where will this procedure be performed?:   [facility]   [facility] TRIAL    Contact medical implant company representative to notify them of the scheduled case and to make sure they will be available to provide required equipment.    Standing Status:   Future    Expected Date:   08/20/2023    Expiration Date:   10/18/2023    Scheduling Instructions:     Side: Bilateral     Level: Lumbar     Device: BOSTON     Sedation: With sedation     Timeframe: As soon as pre-approved    Where will this procedure be performed?:   [facility]   DG PAIN CLINIC C-ARM 1-60 MIN NO REPORT    Intraoperative interpretation by procedural physician at [facility].    Standing Status:   Standing    Number of Occurrences:   1    Reason for exam::   Assistance in needle guidance and placement for procedures requiring needle placement in or near specific anatomical locations not easily accessible without such assistance.   MR THORACIC SPINE WO CONTRAST    Standing Status:   Future    Expiration Date:   07/19/2024    What is the patient's sedation requirement?:   No Sedation    Does the patient have a pacemaker or implanted devices?:   No    Preferred imaging location?:   [facility] (table limit - 550 lbs)   Ambulatory referral to Psychology    Referral Priority:   Routine    Referral Type:   Psychiatric    Referral Reason:   Specialty Services Required    Requested Specialty:   Psychology    Number of Visits Requested:   1   Follow-up plan:   Return in about 6 weeks (around 09/01/2023).     Recent Visits Date Type  Provider Dept  06/28/23 Procedure visit Edward Jolly, MD [facility]  06/09/23 Office Visit Edward Jolly, MD [facility]  05/12/23 Procedure visit Edward Jolly, MD [facility]  05/04/23 Office Visit Edward Jolly, MD [facility]  Showing recent visits within past 90 days and meeting all other requirements Today's Visits Date Type Provider Dept  07/20/23 Office Visit Edward Jolly, MD [facility]  Showing today's visits and meeting all other requirements Future Appointments No visits were found meeting these conditions. Showing future appointments within next 90 days and meeting all other requirements  I discussed the assessment and treatment plan with the patient. The patient was provided an opportunity to ask questions and all were answered. The patient agreed with the plan and demonstrated an understanding of the instructions.  Patient advised to call back or seek an in-person evaluation if the symptoms or condition worsens.  Duration of encounter: 30 minutes.  Total time on encounter, as per AMA guidelines included both the face-to-face and non-face-to-face time personally spent by the physician and/or other qualified health care professional(s) on the day of the encounter (includes time in activities that require the physician or other qualified health care professional and does not include time in activities normally performed by clinical staff). Physician's time may include the following activities when performed: Preparing to see the patient (e.g., pre-charting review of records, searching for previously ordered imaging, lab work, and nerve conduction tests) Review of prior analgesic pharmacotherapies. Reviewing PMP Interpreting ordered tests (e.g., lab work, imaging, nerve conduction tests) Performing post-procedure evaluations, including interpretation of diagnostic procedures Obtaining and/or reviewing separately obtained  history Performing a medically appropriate examination and/or evaluation Counseling and educating the patient/family/caregiver Ordering medications, tests, or procedures Referring and communicating with other health care professionals (when not separately reported) Documenting clinical information in the electronic or other health record Independently interpreting results (not separately reported) and communicating results to the patient/ family/caregiver Care coordination (not separately reported)  Note by: Edward Jolly, MD Date: 07/20/2023; Time: 4:06 PM

## 2023-07-20 NOTE — Progress Notes (Signed)
Safety precautions to be maintained throughout the outpatient stay will include: orient to surroundings, keep bed in low position, maintain call bell within reach at all times, provide assistance with transfer out of bed and ambulation.  

## 2023-07-22 ENCOUNTER — Ambulatory Visit: Payer: BC Managed Care – PPO | Admitting: Occupational Therapy

## 2023-07-22 ENCOUNTER — Encounter: Payer: Self-pay | Admitting: Occupational Therapy

## 2023-07-22 DIAGNOSIS — M25641 Stiffness of right hand, not elsewhere classified: Secondary | ICD-10-CM

## 2023-07-22 DIAGNOSIS — M79601 Pain in right arm: Secondary | ICD-10-CM | POA: Diagnosis not present

## 2023-07-22 DIAGNOSIS — M79602 Pain in left arm: Secondary | ICD-10-CM | POA: Diagnosis not present

## 2023-07-22 DIAGNOSIS — R278 Other lack of coordination: Secondary | ICD-10-CM | POA: Diagnosis not present

## 2023-07-22 DIAGNOSIS — M6281 Muscle weakness (generalized): Secondary | ICD-10-CM | POA: Diagnosis not present

## 2023-07-22 DIAGNOSIS — R29818 Other symptoms and signs involving the nervous system: Secondary | ICD-10-CM | POA: Diagnosis not present

## 2023-07-22 DIAGNOSIS — R29898 Other symptoms and signs involving the musculoskeletal system: Secondary | ICD-10-CM

## 2023-07-22 DIAGNOSIS — R600 Localized edema: Secondary | ICD-10-CM

## 2023-07-22 NOTE — Patient Instructions (Addendum)
Contrast Bath  Prepare the baths.  Cold = 55-65 degrees     Hot = 105-110 degrees  Starting with the hot, dip hand or foot all the way into the water and hold there for selected duration.  Preferably 3 minutes.  After selected duration is up, dip hand or foot into the cold for 1/3 duration of the hot. (3 minutes hot, 1 minute cold)  Alternate back and forth for the times indicated for no more than a total of 20 minutes ending with hot.  Elevate afterwards and do retrograde massage.   Can do above 2 times per day.   Continue to wear compression glove at night

## 2023-07-22 NOTE — Therapy (Signed)
OUTPATIENT OCCUPATIONAL THERAPY NEURO TREATMENT  Patient Name: Brian [name] MRN: [id number] DOB:Sep 07, 1960, 62 y.o., male Today's Date: 07/22/2023  PCP: Keturah Barre REFERRING PROVIDER: Jacquelyne Balint  END OF SESSION:  OT End of Session - 07/22/23 0857     Visit Number 21    Number of Visits 35    Date for OT Re-Evaluation 09/15/23    Authorization Type [employer]  $1600 deductible has been met  20% coinsurance applies  $8500 oop, zero applied  VL combined with pt, ot, st: 60    Authorization Time Period Renewal completed on 07/15/23    OT Start Time 0852    OT Stop Time 0930    OT Time Calculation (min) 38 min    Behavior During Therapy [facility] for tasks assessed/performed             Past Medical History:  Diagnosis Date   COPD (chronic obstructive pulmonary disease) (HCC)    Diabetes mellitus without complication (HCC)    Hypertension    nscl ca dx'd 08/2018   Pneumonia    Subarachnoid hemorrhage (HCC)    Past Surgical History:  Procedure Laterality Date   INGUINAL HERNIA REPAIR Right 06/24/2020   Procedure: RIGHT OPEN INGUINAL HERNIA REPAIR WITH MESH;  Surgeon: Kinsinger, De Blanch, MD;  Location: [facility];  Service: General;  Laterality: Right;   IR IVC FILTER PLMT / S&I Lenise Arena GUID/MOD SED  03/04/2020   IR IVC FILTER RETRIEVAL / S&I Lenise Arena GUID/MOD SED  08/15/2020   IR RADIOLOGIST EVAL & MGMT  08/06/2020   LEG SURGERY  age 56    left leg, from fracture   Patient Active Problem List   Diagnosis Date Noted   Complex regional pain syndrome type 1 of left lower extremity 07/20/2023   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 07/20/2023   Chronic pain syndrome 07/20/2023   Complex regional pain syndrome type 1 of right upper extremity 05/04/2023   Right arm pain 05/04/2023   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   Recurrent right pleural effusion 07/23/2021   Hypothyroidism 10/24/2020   Acute hypoxemic respiratory failure (HCC) 05/19/2020   Acute respiratory failure with  hypoxia (HCC) 05/18/2020   Essential hypertension 05/18/2020   Type 2 diabetes mellitus with hyperlipidemia (HCC) 05/18/2020   S/P thoracentesis 03/21/2020   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/05/2020   Calf swelling 02/21/2020   Rash 02/07/2020   Adenocarcinoma of right lung, stage 4 (HCC) 02/07/2020   Chronic cough 12/20/2019   Venous thrombosis 09/21/2019   Shortness of breath 09/07/2019   Headache 08/09/2019   Lobar pneumonia, unspecified organism (HCC) 03/07/2019   Centrilobular emphysema (HCC) 03/07/2019   Encounter for antineoplastic immunotherapy 11/10/2018   Adenocarcinoma of right lung, stage 3 (HCC) 08/25/2018   Goals of care, counseling/discussion 08/25/2018   Encounter for antineoplastic chemotherapy 08/25/2018   Mass of upper lobe of right lung 08/04/2018   Subarachnoid hemorrhage (HCC) 05/22/2015    ONSET DATE: 03/17/23 Referral  REFERRING DIAG: UE Pain and paresthesias  As of 06/28/23 visit with Dr. Cherylann Ratel:  Complex regional pain syndrome type 1 of right upper extremity    2. Radiation-induced brachial plexopathy (RIGHT)   3. 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 Right arm pain   5. Chronic pain syndrome     THERAPY DIAG:  Edema of hand  Pain in right arm  Other lack of coordination  Muscle weakness (generalized)  Stiffness of right hand, not elsewhere classified  Pain in left arm  Other symptoms and signs involving the musculoskeletal system  Rationale for Evaluation and Treatment: Rehabilitation  SUBJECTIVE:   SUBJECTIVE STATEMENT: "I am having a hard time getting my seat belt on and squeezing the gas lever to fill up my gas. I fear my driving days are close to an end"   Pt returns after ganglion block on 06/28/23. Pain is much better RUE w/ pain now only in shoulder 2/10 but still has occasional electric current "jolts" t/o RUE. However ROM has declined RUE.   Pt accompanied by: self  PERTINENT HISTORY: Lung cancer w/  radiation, SA[facility] 2016. Pt reports developing symptoms RUE this February and worsening symptoms in Rt hand more recently - MD thinks this is due to radiation approx 4 years ago  PRECAUTIONS: None  WEIGHT BEARING RESTRICTIONS: No  PAIN:  Are you having pain? Yes: NPRS scale: 2/10 - gets worse near end of day Pain location: right arm/shoulder - mostly shoulder but also into hand and also reports it is starting to get tingling on L side as well into his hand Pain description: sharp, stabbing, throbbing, aching Aggravating factors: idle - positional Relieving factors: heat, ice, gabapentin Tingling in fingers is worse 3/10   FALLS: Has patient fallen in last 6 months? Yes. Number of falls 1  LIVING ENVIRONMENT: Lives with: lives with their family and lives with their spouse Lives in: House/apartment Stairs: Yes: External: 2 steps; none Has following equipment at home:  has built in shower seat  PLOF: Independent with basic ADLs  PATIENT GOALS: to be able to get back to some level of use of my right hand  BELOW OBJECTIVE INFO AT ORIGINAL EVALUATION DATE BUT HAS DECLINED SINCE EVALUATION:   HAND DOMINANCE: Right  ADLs: Overall ADLs: min assist and increased time Transfers/ambulation related to ADLs: Eating: min assist - wife assists with cutting.  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 driving - uses LUE only at this point Handwriting: unable   POSTURE COMMENTS:  forward head and posterior pelvic tilt   ACTIVITY TOLERANCE: Activity tolerance: reports more fatigue due to constant pain   UPPER EXTREMITY ROM:    Active  ROM Right eval Left eval  Shoulder flexion 115 unable to maintain ext elbow 140  Shoulder abduction    Shoulder adduction    Shoulder extension    Shoulder internal rotation    Shoulder external rotation    Elbow flexion WFL   Elbow extension WFL   Wrist flexion    Wrist extension 30   Wrist ulnar deviation 10   Wrist radial deviation 5   Wrist pronation    Wrist supination    (Blank rows = not tested)  UPPER EXTREMITY MMT:     MMT Right eval Left eval  Shoulder flexion NT 4-/5  Shoulder abduction    Shoulder adduction    Shoulder extension    Shoulder internal rotation    Shoulder external rotation    Middle trapezius    Lower trapezius    Elbow flexion    Elbow extension    Wrist flexion 3   Wrist extension 3   Wrist ulnar deviation    Wrist radial deviation    Wrist pronation  Wrist supination    (Blank rows = not tested) RIGHT HAND - 15% COMPOSITE FLEXION     - 40% COMPOSITE EXTENSION HAND FUNCTION: Grip strength: Right: UNABLE lbs; Left: 31.9 lbs  COORDINATION: Finger Nose Finger test: Mild undershooting Left - right lacks strength/ proximal control to complete  SENSATION: WFL to testing, but reports heightened sensation - hypersensitive  EDEMA: Atrophy RUE - UPPER ARM, FOREARM  MUSCLE TONE: RUE: Moderate and Hypotonic and LUE: Within functional limits  COGNITION: Overall cognitive status: Within functional limits for tasks assessed.  Reports some concentration deficits which he attributes to pain  VISION: Subjective report: no changes Baseline vision: Wears glasses all the time Visual history:  nothing significant  VISION ASSESSMENT: Not tested  Patient has difficulty with following activities due to following visual impairments: NA  PERCEPTION: WFL  PRAXIS: WFL   TODAY'S TREATMENT:                                                                                                                              DATE: 07/22/23   Pt reports  even more decreased use of Lt hand w/ decreased grip strength since recent renewal. Pt is having more difficulty driving. Did discuss and provided handouts on things to assist with hooking seatbelt and gas clip to help fill gas as pt reports difficulty with this. Also discussed [employer] in [employer] for steering wheel adaptations. However, given the fairly rapid decline of LUE - pt may still not be safe to drive w/ no active movement in RUE and significantly decreased hand function Lt hand as well as some weakness in proximal LUE. This therapist is very concerned about his continued decline  Pt was issued yellow theraband w/ adapted handle to simulate movement and strength needed to don seatbelt for driver and passenger side using LUE. Pt also issued blown up disposable latex free glove for Lt grip strength, and small easy squeeze ball for grip, and 3 tip pinch and lateral pinch strength Lt hand  Pt also issued contrast bath info and review of other edema management techniques for Rt hand - see pt instructions for details     *07/15/23 - RENEWAL COMPLETED THIS DATE- Pt has lost function in RUE and also now strength and hand movement Lt hand (see below)  LUE MMT:  Sh flex and abd approx 4+/5 Sh ER/IR 5/5, biceps 5/5, FA sup/pron 5/5 Triceps 4+/5 Wrist ext 4+/5 Lt Grip strength = 6.8 lbs (declined from initial evaluation from 31 lbs).  Lt hand: Pt can only oppose thumb to first index, long,and ring digits, limited thumb flex and abduction, limited intrinsic movement especially with finger abd/add. Pt also does not have full gross finger extension at PIP joints   RUE: pain is much better, but has declined motorically even more since evaluation: limited in gravity elim AA/ROM at shoulder and elbow. Pt has no hand movement and significant edema RT hand, and moderate  edema Rt forearm.   *Discussed writing all new goals for renewal period d/t decline and previous goals no longer  attainable  Reviewed wt bearing and "scrubbing" exercises issued from last land clinic session as these are still appropriate, however instructed to incorporate LUE into these ex's more aggressively than RUE   PATIENT EDUCATION: Education details: edema management and contrast bath info, A/E recommendations for car, theraband ex to simulate donning seatbelt LUE Person educated: Patient Education method: Explanation, Demonstration, Verbal cues, and handout for contrast bath Education comprehension: verbalized understanding and returned demonstration  HOME EXERCISE PROGRAM: 04/14/23: bed positioning, AA/ROM HEP for RUE 04/22/23: passive stretching HEP for Rt hand, edema management strategies 07/22/23: contrast bath info   GOALS: (SEE NEW STG's and LTG's further below for renewal/recertification period) Goals reviewed with patient? Yes  SHORT TERM GOALS: Target date: 04/30/23  Patient will complete HEP designed to improve Bilateral shoulder range of motion  Goal status:MET  2.  Patient will complete HEP designed to improve R elbow flex/ext strength  Goal status: MET  3.  Patient will complete HEP designed to improve range of motion for composite flexion and extension in right hand/wrist  Goal status: MET  4.  Patient will demonstrate ability to pick up and retrieve lightweight object from table with RUE - e.g. cell phone  Goal status: NOT MET   5.  Patient will report improved awareness of positional changes/ support techniques to reduce UE pain.    Goal status: MET   6.  Patient will demonstrate 50% composite active flex/ext in right hand  Goal status: NOT MET - Will continue to work on some active movement as able   LONG TERM GOALS: Target date: 06/28/23  Patient will complete updated HEP designed on UE strengthening without increase in pain  Goal status: IN PROGRESS - Will need modifications due to decline in RUE and even LUE  2.  Patient will demonstrate 10 lb grip  strength R hand  Goal status: NOT MET - no active movement Rt hand  3.  Patient will demonstrate low reach RUE to obtain and release a 2 lb object x 5 without dropping  Goal status: NOT MET - pt only has scapula movement and some limited movement in gravity elim AA/ROM   4.  Patient will be able to cut his own food with modified independence  Goal status: NOT MET  5.  Patient will be independent with aquatic exercise program addressing BUE active movement and strength  Goal status: IN PROGRESS  6.  Patient will dress himself with modified independence  Goal status: NOT MET (Pt only doing 20%)     NEW STG's (due 08/15/23)   1.  Pt will demo 90% or greater P/ROM Rt hand  Baseline: 75% - 80% Goal status: INITIAL  2.  Continue to address edema management and pain management strategies in RUE and Rt hand Baseline:  Goal status: IN PROGRESS  3.  Provide HEP for LUE strength and Lt grip strength  Baseline:  Goal status: INITIAL  4.  Pt to verbalize understanding with compensatory strategies to increase independence with ADLS (particularly dressing, cutting food, opening containers/jars) including one handed techniques and A/E recommendations Baseline:  Goal status: INITIAL  NEW LTG's (due 09/15/23)    1.  Pt to improve grip strength Lt hand by at least 5 lbs  Baseline: 31.9 at initial eval but had declined to 6.8 lbs at recertification Goal status: INITIAL  2.  Improve coordination Lt hand  as evidenced by reducing speed on 9 hole peg test by 10 sec or more Baseline:  64 sec on recert date 07/15/23 Goal status: INITIAL  3.  Pt will dress w/ no more than min assist and will cut food mod I level w/ A/E Baseline:  Goal status: INITIAL   4. Pt will be independent with aquatic exercise program addressing BUE active movement and strength    Goal status: IN PROGRESS      ASSESSMENT:  CLINICAL IMPRESSION: Patient with more decline in LUE function including significant  decline in grip strength and coordination. Pt with another recent ganglion nerve block on 06/28/23. Pt will benefit from more O.T. to address decline in function, address pain and edema management, and new decline in LUE. Pt will continue to benefit from both clinic land and aquatic therapy  PERFORMANCE DEFICITS: in functional skills including ADLs, IADLs, coordination, dexterity, proprioception, sensation, edema, tone, ROM, strength, pain, muscle spasms, Fine motor control, Gross motor control, endurance, decreased knowledge of use of DME, and UE functional use  IMPAIRMENTS: are limiting patient from ADLs, IADLs, rest and sleep, work, and leisure.   CO-MORBIDITIES: has co-morbidities such as CRPS  that affects occupational performance. Patient will benefit from skilled OT to address above impairments and improve overall function.  REHAB POTENTIAL: Good   PLAN:  OT FREQUENCY: 2x/week  OT DURATION: 8 ADDITIONAL weeks  PLANNED INTERVENTIONS: 97168 OT Re-evaluation, 97535 self care/ADL training, 02725 therapeutic exercise, 97530 therapeutic activity, 97112 neuromuscular re-education, 97140 manual therapy, 97113 aquatic therapy, 97035 ultrasound, 97018 paraffin, 36644 fluidotherapy, 97010 moist heat, 97010 cryotherapy, 97034 contrast bath, 97032 electrical stimulation (manual), 97014 electrical stimulation unattended, 97760 Splinting (initial encounter), passive range of motion, functional mobility training, coping strategies training, patient/family education, and DME and/or AE instructions  RECOMMENDED OTHER SERVICES: may have potential for counseling - discussed with patient - he feels he is ok now, but will keep it in mind  CONSULTED AND AGREED WITH PLAN OF CARE: Patient  PLAN FOR NEXT SESSION:  Continue aquatic therapy to address gentle motion / gentle resistance with less influence of gravity - ALSO incorporate LUE into aquatic therapy d/t decline LUE.    Land therapy: Gentle wt bearing  over UE's in standing w/ modifications to wrist/hand prn  consider compression wrapping for Rt hand LUE strengthening HEP for arm    Sheran Lawless, OT 07/22/2023, 8:57 AM

## 2023-07-26 ENCOUNTER — Ambulatory Visit: Payer: BC Managed Care – PPO | Admitting: Occupational Therapy

## 2023-07-26 DIAGNOSIS — M6281 Muscle weakness (generalized): Secondary | ICD-10-CM | POA: Diagnosis not present

## 2023-07-26 DIAGNOSIS — M79601 Pain in right arm: Secondary | ICD-10-CM | POA: Diagnosis not present

## 2023-07-26 DIAGNOSIS — M79602 Pain in left arm: Secondary | ICD-10-CM | POA: Diagnosis not present

## 2023-07-26 DIAGNOSIS — M25641 Stiffness of right hand, not elsewhere classified: Secondary | ICD-10-CM | POA: Diagnosis not present

## 2023-07-26 DIAGNOSIS — R600 Localized edema: Secondary | ICD-10-CM | POA: Diagnosis not present

## 2023-07-26 DIAGNOSIS — R29818 Other symptoms and signs involving the nervous system: Secondary | ICD-10-CM

## 2023-07-26 DIAGNOSIS — R29898 Other symptoms and signs involving the musculoskeletal system: Secondary | ICD-10-CM

## 2023-07-26 DIAGNOSIS — R278 Other lack of coordination: Secondary | ICD-10-CM

## 2023-07-27 ENCOUNTER — Encounter: Payer: Self-pay | Admitting: Occupational Therapy

## 2023-07-27 NOTE — Therapy (Signed)
OUTPATIENT OCCUPATIONAL THERAPY NEURO TREATMENT  Patient Name: REGGINALD MOSCOSO MRN: [id number] DOB:[birth year], 62 y.o., male Today's Date: 07/27/2023  PCP: Keturah Barre REFERRING PROVIDER: Jacquelyne Balint  END OF SESSION:  OT End of Session - 07/27/23 0634     Visit Number 22    Number of Visits 35    Date for OT Re-Evaluation 09/15/23    Authorization Type [employer]  $1600 deductible has been met  20% coinsurance applies  $8500 oop, zero applied  VL combined with pt, ot, st: 60    Authorization Time Period Renewal completed on 07/15/23    OT Start Time 1550    OT Stop Time 1620    OT Time Calculation (min) 30 min    Equipment Utilized During Treatment floatation equipment    Activity Tolerance Patient limited by pain    Behavior During Therapy WFL for tasks assessed/performed             Past Medical History:  Diagnosis Date   COPD (chronic obstructive pulmonary disease) (HCC)    Diabetes mellitus without complication (HCC)    Hypertension    nscl ca dx'd 08/2018   Pneumonia    Subarachnoid hemorrhage (HCC)    Past Surgical History:  Procedure Laterality Date   INGUINAL HERNIA REPAIR Right 06/24/2020   Procedure: RIGHT OPEN INGUINAL HERNIA REPAIR WITH MESH;  Surgeon: Kinsinger, De Blanch, MD;  Location: [facility];  Service: General;  Laterality: Right;   IR IVC FILTER PLMT / S&I Lenise Arena GUID/MOD SED  03/04/2020   IR IVC FILTER RETRIEVAL / S&I Lenise Arena GUID/MOD SED  08/15/2020   IR RADIOLOGIST EVAL & MGMT  08/06/2020   LEG SURGERY  age 36    left leg, from fracture   Patient Active Problem List   Diagnosis Date Noted   Complex regional pain syndrome type 1 of left lower extremity 07/20/2023   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 07/20/2023   Chronic pain syndrome 07/20/2023   Complex regional pain syndrome type 1 of right upper extremity 05/04/2023   Right arm pain 05/04/2023   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   Recurrent right pleural effusion 07/23/2021    Hypothyroidism 10/24/2020   Acute hypoxemic respiratory failure (HCC) 05/19/2020   Acute respiratory failure with hypoxia (HCC) 05/18/2020   Essential hypertension 05/18/2020   Type 2 diabetes mellitus with hyperlipidemia (HCC) 05/18/2020   S/P thoracentesis 03/21/2020   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/05/2020   Calf swelling 02/21/2020   Rash 02/07/2020   Adenocarcinoma of right lung, stage 4 (HCC) 02/07/2020   Chronic cough 12/20/2019   Venous thrombosis 09/21/2019   Shortness of breath 09/07/2019   Headache 08/09/2019   Lobar pneumonia, unspecified organism (HCC) 03/07/2019   Centrilobular emphysema (HCC) 03/07/2019   Encounter for antineoplastic immunotherapy 11/10/2018   Adenocarcinoma of right lung, stage 3 (HCC) 08/25/2018   Goals of care, counseling/discussion 08/25/2018   Encounter for antineoplastic chemotherapy 08/25/2018   Mass of upper lobe of right lung 08/04/2018   Subarachnoid hemorrhage (HCC) 05/22/2015    ONSET DATE: 03/17/23 Referral  REFERRING DIAG: UE Pain and paresthesias  As of 06/28/23 visit with Dr. Cherylann Ratel:  Complex regional pain syndrome type 1 of right upper extremity    2. Radiation-induced brachial plexopathy (RIGHT)   3. 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 Right arm pain   5. Chronic pain syndrome     THERAPY DIAG:  Edema of hand  Pain in right arm  Other lack of coordination  Muscle weakness (generalized)  Stiffness of right hand, not elsewhere classified  Pain in left arm  Other symptoms and signs involving the musculoskeletal system  Other symptoms and signs involving the nervous system  Rationale for Evaluation and Treatment: Rehabilitation  SUBJECTIVE:   SUBJECTIVE STATEMENT: "I am having a hard time getting my seat belt on and squeezing the gas lever to fill up my gas. I fear my driving days are close to an end"   Pt returns after ganglion block on 06/28/23. Pain is much better RUE w/ pain now only in  shoulder 2/10 but still has occasional electric current "jolts" t/o RUE. However ROM has declined RUE.   Pt accompanied by: self  PERTINENT HISTORY: Lung cancer w/ radiation, [facility] 2016. Pt reports developing symptoms RUE this February and worsening symptoms in Rt hand more recently - MD thinks this is due to radiation approx 4 years ago  PRECAUTIONS: None  WEIGHT BEARING RESTRICTIONS: No  PAIN:  Are you having pain? Yes: NPRS scale: 2/10 - gets worse near end of day Pain location: right arm/shoulder - mostly shoulder but also into hand and also reports it is starting to get tingling on L side as well into his hand Pain description: sharp, stabbing, throbbing, aching Aggravating factors: idle - positional Relieving factors: heat, ice, gabapentin Tingling in fingers is worse 3/10   FALLS: Has patient fallen in last 6 months? Yes. Number of falls 1  LIVING ENVIRONMENT: Lives with: lives with their family and lives with their spouse Lives in: House/apartment Stairs: Yes: External: 2 steps; none Has following equipment at home:  has built in shower seat  PLOF: Independent with basic ADLs  PATIENT GOALS: to be able to get back to some level of use of my right hand  BELOW OBJECTIVE INFO AT ORIGINAL EVALUATION DATE BUT HAS DECLINED SINCE EVALUATION:   HAND DOMINANCE: Right  ADLs: Overall ADLs: min assist and increased time Transfers/ambulation related to ADLs: Eating: min assist - wife assists with cutting.  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 driving - uses LUE only at this point Handwriting: unable   POSTURE COMMENTS:   forward head and posterior pelvic tilt   ACTIVITY TOLERANCE: Activity tolerance: reports more fatigue due to constant pain   UPPER EXTREMITY ROM:    Active ROM Right eval Left eval  Shoulder flexion 115 unable to maintain ext elbow 140  Shoulder abduction    Shoulder adduction    Shoulder extension    Shoulder internal rotation    Shoulder external rotation    Elbow flexion WFL   Elbow extension WFL   Wrist flexion    Wrist extension 30   Wrist ulnar deviation 10   Wrist radial deviation 5   Wrist pronation    Wrist supination    (Blank rows = not tested)  UPPER EXTREMITY MMT:     MMT Right eval Left eval  Shoulder flexion NT 4-/5  Shoulder abduction    Shoulder adduction    Shoulder extension    Shoulder internal rotation    Shoulder external rotation    Middle trapezius    Lower trapezius    Elbow flexion    Elbow extension  Wrist flexion 3   Wrist extension 3   Wrist ulnar deviation    Wrist radial deviation    Wrist pronation    Wrist supination    (Blank rows = not tested) RIGHT HAND - 15% COMPOSITE FLEXION     - 40% COMPOSITE EXTENSION HAND FUNCTION: Grip strength: Right: UNABLE lbs; Left: 31.9 lbs  COORDINATION: Finger Nose Finger test: Mild undershooting Left - right lacks strength/ proximal control to complete  SENSATION: WFL to testing, but reports heightened sensation - hypersensitive  EDEMA: Atrophy RUE - UPPER ARM, FOREARM  MUSCLE TONE: RUE: Moderate and Hypotonic and LUE: Within functional limits  COGNITION: Overall cognitive status: Within functional limits for tasks assessed.  Reports some concentration deficits which he attributes to pain  VISION: Subjective report: no changes Baseline vision: Wears glasses all the time Visual history:  nothing significant  VISION ASSESSMENT: Not tested  Patient has difficulty with following activities due to following visual impairments: NA  PERCEPTION: WFL  PRAXIS: WFL   TODAY'S  TREATMENT:                                                                                                                              DATE:  07/26/23: Patient seen for aquatic therapy visit.  Patient unable to independently undress himself this session and needed assistance to unzip pullover shirt to change into swim suit.  Patient also reports he is starting to have difficulty feeding himself.  Patient encouraged to request built up cylindrical foam to add to eating utensils.  Patient wishes to continue aquatic therapy until he has external nerve stimulator applied, then determine if still safe to continue.   Patient entered and exited the pool via stairs independently.  Patient rests back of head on pool wall- patient with forward head, and reports neck stiffness.  Patent unable to tolerate supine position - caused pain in neck and shoulders.  In the past this was a relaxing/comfortable position.   Worked on use of buoyancy assisted and resisted movement for BUE, and used water dumbbells (singles) to improve strength in LUE.  Patient with muscle fatigue with 9-10 repetitions.  Addressed shoulder and elbow resisted movements.    07/22/23   Pt reports even more decreased use of Lt hand w/ decreased grip strength since recent renewal. Pt is having more difficulty driving. Did discuss and provided handouts on things to assist with hooking seatbelt and gas clip to help fill gas as pt reports difficulty with this. Also discussed [employer] in [employer] for steering wheel adaptations. However, given the fairly rapid decline of LUE - pt may still not be safe to drive w/ no active movement in RUE and significantly decreased hand function Lt hand as well as some weakness in proximal LUE. This therapist is very concerned about his continued decline  Pt was issued yellow theraband w/ adapted handle to simulate movement and strength needed to don seatbelt for driver  and passenger side using LUE. Pt  also issued blown up disposable latex free glove for Lt grip strength, and small easy squeeze ball for grip, and 3 tip pinch and lateral pinch strength Lt hand  Pt also issued contrast bath info and review of other edema management techniques for Rt hand - see pt instructions for details     *07/15/23 - RENEWAL COMPLETED THIS DATE- Pt has lost function in RUE and also now strength and hand movement Lt hand (see below)  LUE MMT:  Sh flex and abd approx 4+/5 Sh ER/IR 5/5, biceps 5/5, FA sup/pron 5/5 Triceps 4+/5 Wrist ext 4+/5 Lt Grip strength = 6.8 lbs (declined from initial evaluation from 31 lbs).  Lt hand: Pt can only oppose thumb to first index, long,and ring digits, limited thumb flex and abduction, limited intrinsic movement especially with finger abd/add. Pt also does not have full gross finger extension at PIP joints   RUE: pain is much better, but has declined motorically even more since evaluation: limited in gravity elim AA/ROM at shoulder and elbow. Pt has no hand movement and significant edema RT hand, and moderate edema Rt forearm.   *Discussed writing all new goals for renewal period d/t decline and previous goals no longer attainable  Reviewed wt bearing and "scrubbing" exercises issued from last land clinic session as these are still appropriate, however instructed to incorporate LUE into these ex's more aggressively than RUE   PATIENT EDUCATION: Education details: edema management and contrast bath info, A/E recommendations for car, theraband ex to simulate donning seatbelt LUE Person educated: Patient Education method: Explanation, Demonstration, Verbal cues, and handout for contrast bath Education comprehension: verbalized understanding and returned demonstration  HOME EXERCISE PROGRAM: 04/14/23: bed positioning, AA/ROM HEP for RUE 04/22/23: passive stretching HEP for Rt hand, edema management strategies 07/22/23: contrast bath info   GOALS: (SEE NEW STG's and  LTG's further below for renewal/recertification period) Goals reviewed with patient? Yes  SHORT TERM GOALS: Target date: 04/30/23  Patient will complete HEP designed to improve Bilateral shoulder range of motion  Goal status:MET  2.  Patient will complete HEP designed to improve R elbow flex/ext strength  Goal status: MET  3.  Patient will complete HEP designed to improve range of motion for composite flexion and extension in right hand/wrist  Goal status: MET  4.  Patient will demonstrate ability to pick up and retrieve lightweight object from table with RUE - e.g. cell phone  Goal status: NOT MET   5.  Patient will report improved awareness of positional changes/ support techniques to reduce UE pain.    Goal status: MET   6.  Patient will demonstrate 50% composite active flex/ext in right hand  Goal status: NOT MET - Will continue to work on some active movement as able   LONG TERM GOALS: Target date: 06/28/23  Patient will complete updated HEP designed on UE strengthening without increase in pain  Goal status: IN PROGRESS - Will need modifications due to decline in RUE and even LUE  2.  Patient will demonstrate 10 lb grip strength R hand  Goal status: NOT MET - no active movement Rt hand  3.  Patient will demonstrate low reach RUE to obtain and release a 2 lb object x 5 without dropping  Goal status: NOT MET - pt only has scapula movement and some limited movement in gravity elim AA/ROM   4.  Patient will be able to cut his own food with modified independence  Goal status: NOT MET  5.  Patient will be independent with aquatic exercise program addressing BUE active movement and strength  Goal status: IN PROGRESS  6.  Patient will dress himself with modified independence  Goal status: NOT MET (Pt only doing 20%)     NEW STG's (due 08/15/23)   1.  Pt will demo 90% or greater P/ROM Rt hand  Baseline: 75% - 80% Goal status: INITIAL  2.  Continue to  address edema management and pain management strategies in RUE and Rt hand Baseline:  Goal status: IN PROGRESS  3.  Provide HEP for LUE strength and Lt grip strength  Baseline:  Goal status: INITIAL  4.  Pt to verbalize understanding with compensatory strategies to increase independence with ADLS (particularly dressing, cutting food, opening containers/jars) including one handed techniques and A/E recommendations Baseline:  Goal status: INITIAL  NEW LTG's (due 09/15/23)    1.  Pt to improve grip strength Lt hand by at least 5 lbs  Baseline: 31.9 at initial eval but had declined to 6.8 lbs at recertification Goal status: INITIAL  2.  Improve coordination Lt hand as evidenced by reducing speed on 9 hole peg test by 10 sec or more Baseline:  64 sec on recert date 07/15/23 Goal status: INITIAL  3.  Pt will dress w/ no more than min assist and will cut food mod I level w/ A/E Baseline:  Goal status: INITIAL   4. Pt will be independent with aquatic exercise program addressing BUE active movement and strength    Goal status: IN PROGRESS      ASSESSMENT:  CLINICAL IMPRESSION: Patient continues to benefit from nerve block- psin overall reduced.  Patient concerned with declining function in LUE.  Pt will continue to benefit from both clinic land and aquatic therapy  PERFORMANCE DEFICITS: in functional skills including ADLs, IADLs, coordination, dexterity, proprioception, sensation, edema, tone, ROM, strength, pain, muscle spasms, Fine motor control, Gross motor control, endurance, decreased knowledge of use of DME, and UE functional use  IMPAIRMENTS: are limiting patient from ADLs, IADLs, rest and sleep, work, and leisure.   CO-MORBIDITIES: has co-morbidities such as CRPS  that affects occupational performance. Patient will benefit from skilled OT to address above impairments and improve overall function.  REHAB POTENTIAL: Good   PLAN:  OT FREQUENCY: 2x/week  OT DURATION: 8  ADDITIONAL weeks  PLANNED INTERVENTIONS: 97168 OT Re-evaluation, 97535 self care/ADL training, 16109 therapeutic exercise, 97530 therapeutic activity, 97112 neuromuscular re-education, 97140 manual therapy, 97113 aquatic therapy, 97035 ultrasound, 97018 paraffin, 60454 fluidotherapy, 97010 moist heat, 97010 cryotherapy, 97034 contrast bath, 97032 electrical stimulation (manual), 97014 electrical stimulation unattended, 97760 Splinting (initial encounter), passive range of motion, functional mobility training, coping strategies training, patient/family education, and DME and/or AE instructions  RECOMMENDED OTHER SERVICES: may have potential for counseling - discussed with patient - he feels he is ok now, but will keep it in mind  CONSULTED AND AGREED WITH PLAN OF CARE: Patient  PLAN FOR NEXT SESSION:  Continue aquatic therapy to address gentle motion / gentle resistance with less influence of gravity - ALSO incorporate LUE into aquatic therapy d/t decline LUE.    Land therapy: Gentle wt bearing over UE's in standing w/ modifications to wrist/hand prn  consider compression wrapping for Rt hand LUE strengthening HEP for arm    [name] Salina, OT 07/27/2023, 6:36 AM

## 2023-07-29 ENCOUNTER — Ambulatory Visit: Payer: BC Managed Care – PPO | Admitting: Occupational Therapy

## 2023-07-29 DIAGNOSIS — R29898 Other symptoms and signs involving the musculoskeletal system: Secondary | ICD-10-CM | POA: Diagnosis not present

## 2023-07-29 DIAGNOSIS — M6281 Muscle weakness (generalized): Secondary | ICD-10-CM | POA: Diagnosis not present

## 2023-07-29 DIAGNOSIS — R600 Localized edema: Secondary | ICD-10-CM | POA: Diagnosis not present

## 2023-07-29 DIAGNOSIS — R29818 Other symptoms and signs involving the nervous system: Secondary | ICD-10-CM | POA: Diagnosis not present

## 2023-07-29 DIAGNOSIS — M79601 Pain in right arm: Secondary | ICD-10-CM | POA: Diagnosis not present

## 2023-07-29 DIAGNOSIS — R278 Other lack of coordination: Secondary | ICD-10-CM

## 2023-07-29 DIAGNOSIS — M25641 Stiffness of right hand, not elsewhere classified: Secondary | ICD-10-CM | POA: Diagnosis not present

## 2023-07-29 DIAGNOSIS — M79602 Pain in left arm: Secondary | ICD-10-CM | POA: Diagnosis not present

## 2023-07-29 NOTE — Therapy (Signed)
OUTPATIENT OCCUPATIONAL THERAPY NEURO TREATMENT  Patient Name: Brian [name] MRN: [id number] DOB:[birth year], 62 y.o., male Today's Date: 07/29/2023  PCP: Keturah Barre REFERRING PROVIDER: Jacquelyne Balint  END OF SESSION:  OT End of Session - 07/29/23 1449     Visit Number 23    Number of Visits 35    Date for OT Re-Evaluation 09/15/23    Authorization Type BC[employer]  $1600 deductible has been met  20% coinsurance applies  $8500 oop, zero applied  VL combined with pt, ot, st: 60    Authorization Time Period Renewal completed on 07/15/23    OT Start Time 1450    OT Stop Time 1534    OT Time Calculation (min) 44 min    Activity Tolerance Patient limited by pain    Behavior During Therapy [facility] for tasks assessed/performed             Past Medical History:  Diagnosis Date   COPD (chronic obstructive pulmonary disease) (HCC)    Diabetes mellitus without complication (HCC)    Hypertension    nscl ca dx'd 08/2018   Pneumonia    Subarachnoid hemorrhage (HCC)    Past Surgical History:  Procedure Laterality Date   INGUINAL HERNIA REPAIR Right 06/24/2020   Procedure: RIGHT OPEN INGUINAL HERNIA REPAIR WITH MESH;  Surgeon: Kinsinger, De Blanch, MD;  Location: [facility];  Service: General;  Laterality: Right;   IR IVC FILTER PLMT / S&I Lenise Arena GUID/MOD SED  03/04/2020   IR IVC FILTER RETRIEVAL / S&I Lenise Arena GUID/MOD SED  08/15/2020   IR RADIOLOGIST EVAL & MGMT  08/06/2020   LEG SURGERY  age 52    left leg, from fracture   Patient Active Problem List   Diagnosis Date Noted   Complex regional pain syndrome type 1 of left lower extremity 07/20/2023   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 07/20/2023   Chronic pain syndrome 07/20/2023   Complex regional pain syndrome type 1 of right upper extremity 05/04/2023   Right arm pain 05/04/2023   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   Recurrent right pleural effusion 07/23/2021   Hypothyroidism 10/24/2020   Acute hypoxemic respiratory failure  (HCC) 05/19/2020   Acute respiratory failure with hypoxia (HCC) 05/18/2020   Essential hypertension 05/18/2020   Type 2 diabetes mellitus with hyperlipidemia (HCC) 05/18/2020   S/P thoracentesis 03/21/2020   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/05/2020   Calf swelling 02/21/2020   Rash 02/07/2020   Adenocarcinoma of right lung, stage 4 (HCC) 02/07/2020   Chronic cough 12/20/2019   Venous thrombosis 09/21/2019   Shortness of breath 09/07/2019   Headache 08/09/2019   Lobar pneumonia, unspecified organism (HCC) 03/07/2019   Centrilobular emphysema (HCC) 03/07/2019   Encounter for antineoplastic immunotherapy 11/10/2018   Adenocarcinoma of right lung, stage 3 (HCC) 08/25/2018   Goals of care, counseling/discussion 08/25/2018   Encounter for antineoplastic chemotherapy 08/25/2018   Mass of upper lobe of right lung 08/04/2018   Subarachnoid hemorrhage (HCC) 05/22/2015    ONSET DATE: 03/17/23 Referral  REFERRING DIAG: UE Pain and paresthesias  As of 06/28/23 visit with Dr. Cherylann Ratel:  Complex regional pain syndrome type 1 of right upper extremity    2. Radiation-induced brachial plexopathy (RIGHT)   3. 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 Right arm pain   5. Chronic pain syndrome     THERAPY DIAG:  No diagnosis found.  Rationale for Evaluation and Treatment: Rehabilitation  SUBJECTIVE:   SUBJECTIVE STATEMENT: Pt reports he is having difficulty  with control of spoon or fork in Left hand for eating. He has also noticed increased difficulty with typing recently. Digits 3-5 on L do not move as well as pt would like.   Pt accompanied by: self  PERTINENT HISTORY: Lung cancer w/ radiation, [facility] 2016. Pt reports developing symptoms RUE this February and worsening symptoms in Rt hand more recently - MD thinks this is due to radiation approx 4 years ago  PRECAUTIONS: None  WEIGHT BEARING RESTRICTIONS: No  PAIN:  Are you having pain? Yes: NPRS scale: 4/10 - gets worse near  end of day Pain location: right arm/shoulder - mostly shoulder but also into hand and also reports it is starting to get tingling on L side as well into his hand Pain description: sharp, stabbing, throbbing, aching Aggravating factors: idle - positional Relieving factors: heat, ice, gabapentin Tingling in fingers is worse 3/10   FALLS: Has patient fallen in last 6 months? Yes. Number of falls 1  LIVING ENVIRONMENT: Lives with: lives with their family and lives with their spouse Lives in: House/apartment Stairs: Yes: External: 2 steps; none Has following equipment at home:  has built in shower seat  PLOF: Independent with basic ADLs  PATIENT GOALS: to be able to get back to some level of use of my right hand  BELOW OBJECTIVE INFO AT ORIGINAL EVALUATION DATE BUT HAS DECLINED SINCE EVALUATION:   HAND DOMINANCE: Right  ADLs: Overall ADLs: min assist and increased time Transfers/ambulation related to ADLs: Eating: min assist - wife assists with cutting.  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 driving - uses LUE only at this point Handwriting: unable   POSTURE COMMENTS:  forward head and posterior pelvic tilt   ACTIVITY TOLERANCE: Activity tolerance: reports more fatigue due to constant pain   UPPER EXTREMITY ROM:    Active ROM Right eval Left eval  Shoulder flexion 115 unable to maintain ext elbow 140  Shoulder abduction    Shoulder adduction    Shoulder extension    Shoulder internal rotation    Shoulder external rotation    Elbow flexion WFL   Elbow extension WFL   Wrist flexion    Wrist extension 30    Wrist ulnar deviation 10   Wrist radial deviation 5   Wrist pronation    Wrist supination    (Blank rows = not tested)  UPPER EXTREMITY MMT:     MMT Right eval Left eval  Shoulder flexion NT 4-/5  Shoulder abduction    Shoulder adduction    Shoulder extension    Shoulder internal rotation    Shoulder external rotation    Middle trapezius    Lower trapezius    Elbow flexion    Elbow extension    Wrist flexion 3   Wrist extension 3   Wrist ulnar deviation    Wrist radial deviation    Wrist pronation    Wrist supination    (Blank rows = not tested) RIGHT HAND - 15% COMPOSITE FLEXION     - 40% COMPOSITE EXTENSION HAND FUNCTION: Grip strength: Right: UNABLE lbs; Left: 31.9 lbs  COORDINATION: Finger Nose Finger test: Mild undershooting Left - right lacks strength/ proximal control to complete  SENSATION: WFL to testing, but reports heightened sensation - hypersensitive  EDEMA: Atrophy RUE - UPPER ARM, FOREARM  MUSCLE TONE: RUE: Moderate and Hypotonic and LUE: Within functional limits  COGNITION: Overall cognitive status: Within functional limits for tasks assessed.  Reports some concentration deficits which he attributes to pain  VISION: Subjective report: no changes Baseline vision: Wears glasses all the time Visual history:  nothing significant  VISION ASSESSMENT: Not tested  Patient has difficulty with following activities due to following visual impairments: NA  PERCEPTION: WFL  PRAXIS: WFL   TODAY'S TREATMENT:                                                                                                                              DATE: 07/22/23   Pt reports even more decreased use of Lt hand w/ decreased grip strength since recent renewal. Pt is having more difficulty driving. Did discuss and provided handouts on things to assist with hooking seatbelt and gas clip to help fill gas as pt reports difficulty with this. Also discussed [employer] in [employer] for steering wheel adaptations. However, given the fairly rapid decline of LUE - pt may still not be safe to drive w/ no active movement in RUE and significantly decreased hand function Lt hand as well as some weakness in proximal LUE. This therapist is very concerned about his continued decline  Pt was issued yellow theraband w/ adapted handle to simulate movement and strength needed to don seatbelt for driver and passenger side using LUE. Pt also issued blown up disposable latex free glove for Lt grip strength, and small easy squeeze ball for grip, and 3 tip pinch and lateral pinch strength Lt hand  Pt also issued contrast bath info and review of other edema management techniques for Rt hand - see pt instructions for details  07/29/2023:  OT educated pt on use of utensils with foam tubing. Pt demonstrating ability to place 5 beans from bowl into coffee can using L hand.    OT educated pt on edema reduction techniques as noted in pt instructions for RUE.   OT educated pt on NMES placement, specifically R wrist and digit extension for improved edema, ROM, and pain management.    OT initiated LUE coordination HOME EXERCISE PROGRAM as noted in patient instructions including pick up and placement of items, shuffling and turning cards, item stacking and unstacking, rolling a piece of tissue paper into a ball, rolling golf balls in hand, rolling a pen in between fingers and thumb, picking up and storing items in hand. Patient able to return demonstration of all exercises. Handout provided.   - Ultrasound completed for duration as noted below including:  Ultrasound applied to dorsal left hand, digits, and wrist for 8 minutes, frequency of 3 MHz, 20% duty cycle, and 1.1 W/cm with pt's arm placed on soft towel for promotion of ROM, edema reduction, and pain reduction in affected extremity.  PATIENT EDUCATION: Education details: see above Person educated: Patient Education  method: Programmer, multimedia, Demonstration, Verbal cues, and Handouts Education comprehension: verbalized understanding and returned demonstration  HOME EXERCISE PROGRAM: 04/14/23: bed positioning, AA/ROM HEP for RUE 04/22/23: passive stretching HEP for Rt hand, edema management strategies 07/22/23: contrast bath info 07/29/2023: swelling reduction; coordination HEP; NMES applications   GOALS: (SEE NEW STG's and LTG's further below for renewal/recertification period) Goals reviewed with patient? Yes  SHORT TERM GOALS: Target date: 04/30/23  Patient will complete HEP designed to improve Bilateral shoulder range of motion  Goal status:MET  2.  Patient will complete HEP designed to improve R elbow flex/ext strength  Goal status: MET  3.  Patient will complete HEP designed to improve range of motion for composite flexion and extension in right hand/wrist  Goal status: MET  4.  Patient will demonstrate ability to pick up and retrieve lightweight object from table with RUE - e.g. cell phone  Goal status: NOT MET   5.  Patient will report improved awareness of positional changes/ support techniques to reduce UE pain.    Goal status: MET   6.  Patient will demonstrate 50% composite active flex/ext in right hand  Goal status: NOT MET - Will continue to work on some active movement as able   LONG TERM GOALS: Target date: 06/28/23  Patient will complete updated HEP designed on UE strengthening without increase in pain  Goal status: IN PROGRESS - Will need modifications due to decline in RUE and even LUE  2.  Patient will demonstrate 10 lb grip strength R hand  Goal status: NOT MET - no active movement Rt hand  3.  Patient will demonstrate low reach RUE to obtain and release a 2 lb object x 5 without dropping  Goal status: NOT MET - pt only has scapula movement and some limited movement in gravity elim AA/ROM   4.  Patient will be able to cut his own food with modified  independence  Goal status: NOT MET  5.  Patient will be independent with aquatic exercise program addressing BUE active movement and strength  Goal status: IN PROGRESS  6.  Patient will dress himself with modified independence  Goal status: NOT MET (Pt only doing 20%)    NEW STG's (due 08/15/23)   1.  Pt will demo 90% or greater P/ROM Rt hand  Baseline: 75% - 80% Goal status: INITIAL  2.  Continue to address edema management and pain management strategies in RUE and Rt hand Baseline:  Goal status: IN PROGRESS  3.  Provide HEP for LUE strength and Lt grip strength  Baseline:  Goal status: INITIAL  4.  Pt to verbalize understanding with compensatory strategies to increase independence with ADLS (particularly dressing, cutting food, opening containers/jars) including one handed techniques and A/E recommendations Baseline:  Goal status: INITIAL  NEW LTG's (due 09/15/23)    1.  Pt to improve grip strength Lt hand by at least 5 lbs  Baseline: 31.9 at initial eval but had declined to 6.8 lbs at recertification Goal status: INITIAL  2.  Improve coordination Lt hand as evidenced by reducing speed on 9 hole peg test by 10 sec or more Baseline:  64 sec on recert date 07/15/23 Goal status: INITIAL  3.  Pt will dress w/ no more than min assist and will cut food mod I level w/ A/E Baseline:  Goal status: INITIAL   4. Pt will be independent with aquatic exercise program addressing BUE  active movement and strength    Goal status: IN PROGRESS    ASSESSMENT:  CLINICAL IMPRESSION: Pt demonstrates good understanding of recommendations today as needed to progress towards goals. Recommend use of Kinesiotape to R dorsal wrist with education for home completion should he find benefit to this application.  PERFORMANCE DEFICITS: in functional skills including ADLs, IADLs, coordination, dexterity, proprioception, sensation, edema, tone, ROM, strength, pain, muscle spasms, Fine motor  control, Gross motor control, endurance, decreased knowledge of use of DME, and UE functional use  IMPAIRMENTS: are limiting patient from ADLs, IADLs, rest and sleep, work, and leisure.   CO-MORBIDITIES: has co-morbidities such as CRPS  that affects occupational performance. Patient will benefit from skilled OT to address above impairments and improve overall function.  REHAB POTENTIAL: Good   PLAN:  OT FREQUENCY: 2x/week  OT DURATION: 8 ADDITIONAL weeks  PLANNED INTERVENTIONS: 97168 OT Re-evaluation, 97535 self care/ADL training, 14782 therapeutic exercise, 97530 therapeutic activity, 97112 neuromuscular re-education, 97140 manual therapy, 97113 aquatic therapy, 97035 ultrasound, 97018 paraffin, 95621 fluidotherapy, 97010 moist heat, 97010 cryotherapy, 97034 contrast bath, 97032 electrical stimulation (manual), 97014 electrical stimulation unattended, 97760 Splinting (initial encounter), passive range of motion, functional mobility training, coping strategies training, patient/family education, and DME and/or AE instructions  RECOMMENDED OTHER SERVICES: may have potential for counseling - discussed with patient - he feels he is ok now, but will keep it in mind  CONSULTED AND AGREED WITH PLAN OF CARE: Patient  PLAN FOR NEXT SESSION:  Continue aquatic therapy to address gentle motion / gentle resistance with less influence of gravity - ALSO incorporate LUE into aquatic therapy d/t decline LUE.    Land therapy: Gentle wt bearing over UE's in standing w/ modifications to wrist/hand prn  consider compression wrapping for Rt hand LUE strengthening HEP for arm  Kinesiotape, how did this go? - Pulsed [place],  how did this go?   Delana Meyer, OT 07/29/2023, 3:58 PM

## 2023-07-29 NOTE — Patient Instructions (Addendum)
Best Electrode Placement for Arm and Hand Stroke Rehabilitation Electrical stimulation, also referred to as e-stim, NMES, or FES, can be an effective tool in reducing the symptoms of stroke, such as increasing strength and function. The success of one's recovery using electrical stimulation will rely heavily on proper electrode placement.  Listed below are some key video examples of upper limb electrode positioning by Axelgaard. Click on the thumbnail below to visit the video link. Electrode Positions for Common UE Stroke Muscle Groups 1. SHOULDER Retraction - shoulder blade moves back toward spine  2. SHOULDER Flexion (black in front, red in back) - arm comes forward 3. SHOULDER Abduction (red in front, black in back) - arm comes away from body and out to side       4. ELBOW Flexion - elbow bends  5. ELBOW Extension - elbow straightens             6. WRIST/FINGER Extension - wrist comes up and fingers straighten  7. FINGER Flexion - fingers bend             8. THUMB Abduction - thumb moves away from palm  9. THUMB Opposition - thumb moves toward pad of index finger   Information obtained from https://[email or web address].com/electrode-placement/. Please visit this website for videos and additional information as needed.     Swelling Reduction  Place affected area above the level of your heart. You can use pillows for support.   Move the affected area. Think about tightening the muscles in this area and then relaxing them.  "Pet" the area from the outside of your body toward the center of your body. If it is your hand, from the tips of your fingers to the shoulder or chest.

## 2023-08-02 ENCOUNTER — Ambulatory Visit: Payer: BC Managed Care – PPO | Admitting: Occupational Therapy

## 2023-08-02 ENCOUNTER — Encounter: Payer: Self-pay | Admitting: Occupational Therapy

## 2023-08-02 DIAGNOSIS — M79602 Pain in left arm: Secondary | ICD-10-CM | POA: Diagnosis not present

## 2023-08-02 DIAGNOSIS — R600 Localized edema: Secondary | ICD-10-CM

## 2023-08-02 DIAGNOSIS — M25641 Stiffness of right hand, not elsewhere classified: Secondary | ICD-10-CM | POA: Diagnosis not present

## 2023-08-02 DIAGNOSIS — M79601 Pain in right arm: Secondary | ICD-10-CM

## 2023-08-02 DIAGNOSIS — M6281 Muscle weakness (generalized): Secondary | ICD-10-CM

## 2023-08-02 DIAGNOSIS — R29818 Other symptoms and signs involving the nervous system: Secondary | ICD-10-CM | POA: Diagnosis not present

## 2023-08-02 DIAGNOSIS — R278 Other lack of coordination: Secondary | ICD-10-CM

## 2023-08-02 DIAGNOSIS — R29898 Other symptoms and signs involving the musculoskeletal system: Secondary | ICD-10-CM | POA: Diagnosis not present

## 2023-08-02 NOTE — Therapy (Signed)
OUTPATIENT OCCUPATIONAL THERAPY NEURO TREATMENT  Patient Name: Brian [name] MRN: [id number] DOB:January 03, 1961, 62 y.o., male Today's Date: 08/02/2023  PCP: Keturah Barre REFERRING PROVIDER: Jacquelyne Balint  END OF SESSION:  OT End of Session - 08/02/23 1813     Visit Number 24    Number of Visits 35    Date for OT Re-Evaluation 09/15/23    Authorization Type [employer]  $1600 deductible has been met  20% coinsurance applies  $8500 oop, zero applied  VL combined with pt, ot, st: 60    Authorization Time Period Renewal completed on 07/15/23    OT Start Time 1545    OT Stop Time 1612    OT Time Calculation (min) 27 min    Equipment Utilized During Treatment floatation equipment    Activity Tolerance Patient limited by lethargy    Behavior During Therapy WFL for tasks assessed/performed             Past Medical History:  Diagnosis Date   COPD (chronic obstructive pulmonary disease) (HCC)    Diabetes mellitus without complication (HCC)    Hypertension    nscl ca dx'd 08/2018   Pneumonia    Subarachnoid hemorrhage (HCC)    Past Surgical History:  Procedure Laterality Date   INGUINAL HERNIA REPAIR Right 06/24/2020   Procedure: RIGHT OPEN INGUINAL HERNIA REPAIR WITH MESH;  Surgeon: Kinsinger, De Blanch, MD;  Location: [facility];  Service: General;  Laterality: Right;   IR IVC FILTER PLMT / S&I Lenise Arena GUID/MOD SED  03/04/2020   IR IVC FILTER RETRIEVAL / S&I Lenise Arena GUID/MOD SED  08/15/2020   IR RADIOLOGIST EVAL & MGMT  08/06/2020   LEG SURGERY  age 67    left leg, from fracture   Patient Active Problem List   Diagnosis Date Noted   Complex regional pain syndrome type 1 of left lower extremity 07/20/2023   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 07/20/2023   Chronic pain syndrome 07/20/2023   Complex regional pain syndrome type 1 of right upper extremity 05/04/2023   Right arm pain 05/04/2023   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   Recurrent right pleural effusion 07/23/2021    Hypothyroidism 10/24/2020   Acute hypoxemic respiratory failure (HCC) 05/19/2020   Acute respiratory failure with hypoxia (HCC) 05/18/2020   Essential hypertension 05/18/2020   Type 2 diabetes mellitus with hyperlipidemia (HCC) 05/18/2020   S/P thoracentesis 03/21/2020   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/05/2020   Calf swelling 02/21/2020   Rash 02/07/2020   Adenocarcinoma of right lung, stage 4 (HCC) 02/07/2020   Chronic cough 12/20/2019   Venous thrombosis 09/21/2019   Shortness of breath 09/07/2019   Headache 08/09/2019   Lobar pneumonia, unspecified organism (HCC) 03/07/2019   Centrilobular emphysema (HCC) 03/07/2019   Encounter for antineoplastic immunotherapy 11/10/2018   Adenocarcinoma of right lung, stage 3 (HCC) 08/25/2018   Goals of care, counseling/discussion 08/25/2018   Encounter for antineoplastic chemotherapy 08/25/2018   Mass of upper lobe of right lung 08/04/2018   Subarachnoid hemorrhage (HCC) 05/22/2015    ONSET DATE: 03/17/23 Referral  REFERRING DIAG: UE Pain and paresthesias  As of 06/28/23 visit with Dr. Cherylann Ratel:  Complex regional pain syndrome type 1 of right upper extremity    2. Radiation-induced brachial plexopathy (RIGHT)   3. 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 Right arm pain   5. Chronic pain syndrome     THERAPY DIAG:  Pain in right arm  Other lack of coordination  Stiffness of right  hand, not elsewhere classified  Edema of hand  Muscle weakness (generalized)  Other symptoms and signs involving the musculoskeletal system  Other symptoms and signs involving the nervous system  Pain in left arm  Rationale for Evaluation and Treatment: Rehabilitation  SUBJECTIVE:   SUBJECTIVE STATEMENT: Pt reports he is having difficulty with control of spoon or fork in Left hand for eating. He has also noticed increased difficulty with typing recently. Digits 3-5 on L do not move as well as pt would like.   Pt accompanied by:  self  PERTINENT HISTORY: Lung cancer w/ radiation, [facility] 2016. Pt reports developing symptoms RUE this February and worsening symptoms in Rt hand more recently - MD thinks this is due to radiation approx 4 years ago  PRECAUTIONS: None  WEIGHT BEARING RESTRICTIONS: No  PAIN:  Are you having pain? Yes: NPRS scale: 4/10 - gets worse near end of day Pain location: right arm/shoulder - mostly shoulder but also into hand and also reports it is starting to get tingling on L side as well into his hand Pain description: sharp, stabbing, throbbing, aching Aggravating factors: idle - positional Relieving factors: heat, ice, gabapentin Tingling in fingers is worse 3/10   FALLS: Has patient fallen in last 6 months? Yes. Number of falls 1  LIVING ENVIRONMENT: Lives with: lives with their family and lives with their spouse Lives in: House/apartment Stairs: Yes: External: 2 steps; none Has following equipment at home:  has built in shower seat  PLOF: Independent with basic ADLs  PATIENT GOALS: to be able to get back to some level of use of my right hand  BELOW OBJECTIVE INFO AT ORIGINAL EVALUATION DATE BUT HAS DECLINED SINCE EVALUATION:   HAND DOMINANCE: Right  ADLs: Overall ADLs: min assist and increased time Transfers/ambulation related to ADLs: Eating: min assist - wife assists with cutting.  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 driving - uses LUE only at this point Handwriting: unable   POSTURE COMMENTS:  forward head and posterior pelvic tilt   ACTIVITY TOLERANCE: Activity tolerance: reports more fatigue due to constant  pain   UPPER EXTREMITY ROM:    Active ROM Right eval Left eval  Shoulder flexion 115 unable to maintain ext elbow 140  Shoulder abduction    Shoulder adduction    Shoulder extension    Shoulder internal rotation    Shoulder external rotation    Elbow flexion WFL   Elbow extension WFL   Wrist flexion    Wrist extension 30   Wrist ulnar deviation 10   Wrist radial deviation 5   Wrist pronation    Wrist supination    (Blank rows = not tested)  UPPER EXTREMITY MMT:     MMT Right eval Left eval  Shoulder flexion NT 4-/5  Shoulder abduction    Shoulder adduction    Shoulder extension    Shoulder internal rotation    Shoulder external rotation    Middle trapezius    Lower trapezius    Elbow flexion    Elbow extension    Wrist flexion 3   Wrist extension 3   Wrist ulnar deviation    Wrist radial deviation    Wrist pronation  Wrist supination    (Blank rows = not tested) RIGHT HAND - 15% COMPOSITE FLEXION     - 40% COMPOSITE EXTENSION HAND FUNCTION: Grip strength: Right: UNABLE lbs; Left: 31.9 lbs  COORDINATION: Finger Nose Finger test: Mild undershooting Left - right lacks strength/ proximal control to complete  SENSATION: WFL to testing, but reports heightened sensation - hypersensitive  EDEMA: Atrophy RUE - UPPER ARM, FOREARM  MUSCLE TONE: RUE: Moderate and Hypotonic and LUE: Within functional limits  COGNITION: Overall cognitive status: Within functional limits for tasks assessed.  Reports some concentration deficits which he attributes to pain  VISION: Subjective report: no changes Baseline vision: Wears glasses all the time Visual history:  nothing significant  VISION ASSESSMENT: Not tested  Patient has difficulty with following activities due to following visual impairments: NA  PERCEPTION: WFL  PRAXIS: WFL   TODAY'S TREATMENT:                                                                                                                               DATE: 07/22/23   Pt reports even more decreased use of Lt hand w/ decreased grip strength since recent renewal. Pt is having more difficulty driving. Did discuss and provided handouts on things to assist with hooking seatbelt and gas clip to help fill gas as pt reports difficulty with this. Also discussed [employer] in [employer] for steering wheel adaptations. However, given the fairly rapid decline of LUE - pt may still not be safe to drive w/ no active movement in RUE and significantly decreased hand function Lt hand as well as some weakness in proximal LUE. This therapist is very concerned about his continued decline  Pt was issued yellow theraband w/ adapted handle to simulate movement and strength needed to don seatbelt for driver and passenger side using LUE. Pt also issued blown up disposable latex free glove for Lt grip strength, and small easy squeeze ball for grip, and 3 tip pinch and lateral pinch strength Lt hand  Pt also issued contrast bath info and review of other edema management techniques for Rt hand - see pt instructions for details  08/02/23 Patient attended aquatic therapy visit, entering and exiting the pool independently at stairs with left hand rail.  Patient with very forward head posture, atrophy noted in right shoulder girdle- inferior subluxation, and elevated scapula.  In seated position worked to float right hand at surface and realign/ support humeral head and scapula.  Patient reports reduction in pain with UE support.  Worked on reducing scapular elevation, and activating shoulder depressors.  Used property of buoyancy to allow patient free movement in arms in gravity reduced environment.  Unable to sustain grasp on water dumb bell in left hand this session.  Patient very concerned regarding continuing to lose strength in left side.  Scheduled for MRI tomorrow, and hopes to have schedule for stimulator later this week.     07/29/2023:  OT  educated pt on use of utensils with foam tubing. Pt demonstrating ability to place 5 beans from bowl into coffee can using L hand.    OT educated pt on edema reduction techniques as noted in pt instructions for RUE.   OT educated pt on NMES placement, specifically R wrist and digit extension for improved edema, ROM, and pain management.    OT initiated LUE coordination HOME EXERCISE PROGRAM as noted in patient instructions including pick up and placement of items, shuffling and turning cards, item stacking and unstacking, rolling a piece of tissue paper into a ball, rolling golf balls in hand, rolling a pen in between fingers and thumb, picking up and storing items in hand. Patient able to return demonstration of all exercises. Handout provided.   - Ultrasound completed for duration as noted below including:  Ultrasound applied to dorsal left hand, digits, and wrist for 8 minutes, frequency of 3 MHz, 20% duty cycle, and 1.1 W/cm with pt's arm placed on soft towel for promotion of ROM, edema reduction, and pain reduction in affected extremity.  PATIENT EDUCATION: Education details: see above Person educated: Patient Education method: Programmer, multimedia, Demonstration, Verbal cues, and Handouts Education comprehension: verbalized understanding and returned demonstration  HOME EXERCISE PROGRAM: 04/14/23: bed positioning, AA/ROM HEP for RUE 04/22/23: passive stretching HEP for Rt hand, edema management strategies 07/22/23: contrast bath info 07/29/2023: swelling reduction; coordination HEP; NMES applications   GOALS: (SEE NEW STG's and LTG's further below for renewal/recertification period) Goals reviewed with patient? Yes  SHORT TERM GOALS: Target date: 04/30/23  Patient will complete HEP designed to improve Bilateral shoulder range of motion  Goal status:MET  2.  Patient will complete HEP designed to improve R elbow flex/ext strength  Goal status: MET  3.  Patient will complete HEP  designed to improve range of motion for composite flexion and extension in right hand/wrist  Goal status: MET  4.  Patient will demonstrate ability to pick up and retrieve lightweight object from table with RUE - e.g. cell phone  Goal status: NOT MET   5.  Patient will report improved awareness of positional changes/ support techniques to reduce UE pain.    Goal status: MET   6.  Patient will demonstrate 50% composite active flex/ext in right hand  Goal status: NOT MET - Will continue to work on some active movement as able   LONG TERM GOALS: Target date: 06/28/23  Patient will complete updated HEP designed on UE strengthening without increase in pain  Goal status: IN PROGRESS - Will need modifications due to decline in RUE and even LUE  2.  Patient will demonstrate 10 lb grip strength R hand  Goal status: NOT MET - no active movement Rt hand  3.  Patient will demonstrate low reach RUE to obtain and release a 2 lb object x 5 without dropping  Goal status: NOT MET - pt only has scapula movement and some limited movement in gravity elim AA/ROM   4.  Patient will be able to cut his own food with modified independence  Goal status: NOT MET  5.  Patient will be independent with aquatic exercise program addressing BUE active movement and strength  Goal status: IN PROGRESS  6.  Patient will dress himself with modified independence  Goal status: NOT MET (Pt only doing 20%)    NEW STG's (due 08/15/23)   1.  Pt will demo 90% or greater P/ROM Rt hand  Baseline: 75% - 80% Goal status:  INITIAL  2.  Continue to address edema management and pain management strategies in RUE and Rt hand Baseline:  Goal status: IN PROGRESS  3.  Provide HEP for LUE strength and Lt grip strength  Baseline:  Goal status: INITIAL  4.  Pt to verbalize understanding with compensatory strategies to increase independence with ADLS (particularly dressing, cutting food, opening containers/jars)  including one handed techniques and A/E recommendations Baseline:  Goal status: INITIAL  NEW LTG's (due 09/15/23)    1.  Pt to improve grip strength Lt hand by at least 5 lbs  Baseline: 31.9 at initial eval but had declined to 6.8 lbs at recertification Goal status: INITIAL  2.  Improve coordination Lt hand as evidenced by reducing speed on 9 hole peg test by 10 sec or more Baseline:  64 sec on recert date 07/15/23 Goal status: INITIAL  3.  Pt will dress w/ no more than min assist and will cut food mod I level w/ A/E Baseline:  Goal status: INITIAL   4. Pt will be independent with aquatic exercise program addressing BUE active movement and strength    Goal status: IN PROGRESS    ASSESSMENT:  CLINICAL IMPRESSION: Pt demonstrates good understanding of recommendations today as needed to progress towards goals. Recommend use of Kinesiotape to R dorsal wrist with education for home completion should he find benefit to this application.  PERFORMANCE DEFICITS: in functional skills including ADLs, IADLs, coordination, dexterity, proprioception, sensation, edema, tone, ROM, strength, pain, muscle spasms, Fine motor control, Gross motor control, endurance, decreased knowledge of use of DME, and UE functional use  IMPAIRMENTS: are limiting patient from ADLs, IADLs, rest and sleep, work, and leisure.   CO-MORBIDITIES: has co-morbidities such as CRPS  that affects occupational performance. Patient will benefit from skilled OT to address above impairments and improve overall function.  REHAB POTENTIAL: Good   PLAN:  OT FREQUENCY: 2x/week  OT DURATION: 8 ADDITIONAL weeks  PLANNED INTERVENTIONS: 97168 OT Re-evaluation, 97535 self care/ADL training, 24401 therapeutic exercise, 97530 therapeutic activity, 97112 neuromuscular re-education, 97140 manual therapy, 97113 aquatic therapy, 97035 ultrasound, 97018 paraffin, 02725 fluidotherapy, 97010 moist heat, 97010 cryotherapy, 97034 contrast  bath, 97032 electrical stimulation (manual), 97014 electrical stimulation unattended, 97760 Splinting (initial encounter), passive range of motion, functional mobility training, coping strategies training, patient/family education, and DME and/or AE instructions  RECOMMENDED OTHER SERVICES: may have potential for counseling - discussed with patient - he feels he is ok now, but will keep it in mind  CONSULTED AND AGREED WITH PLAN OF CARE: Patient  PLAN FOR NEXT SESSION:  Continue aquatic therapy to address gentle motion / gentle resistance with less influence of gravity - ALSO incorporate LUE into aquatic therapy d/t decline LUE.    Land therapy: Gentle wt bearing over UE's in standing w/ modifications to wrist/hand prn  consider compression wrapping for Rt hand LUE strengthening HEP for arm  Kinesiotape, how did this go? - Pulsed [place],  how did this go?   [name] Salina, OT 08/02/2023, 6:15 PM

## 2023-08-03 ENCOUNTER — Ambulatory Visit (HOSPITAL_COMMUNITY)
Admission: RE | Admit: 2023-08-03 | Discharge: 2023-08-03 | Disposition: A | Discharge status: Home / Self Care | Payer: BC Managed Care – PPO | Source: Ambulatory Visit | Attending: Student in an Organized Health Care Education/Training Program | Admitting: Student in an Organized Health Care Education/Training Program

## 2023-08-03 DIAGNOSIS — G90511 Complex regional pain syndrome I of right upper limb: Secondary | ICD-10-CM | POA: Insufficient documentation

## 2023-08-03 DIAGNOSIS — G90522 Complex regional pain syndrome I of left lower limb: Secondary | ICD-10-CM | POA: Insufficient documentation

## 2023-08-03 DIAGNOSIS — G894 Chronic pain syndrome: Secondary | ICD-10-CM | POA: Diagnosis not present

## 2023-08-03 DIAGNOSIS — M438X5 Other specified deforming dorsopathies, thoracolumbar region: Secondary | ICD-10-CM | POA: Diagnosis not present

## 2023-08-03 DIAGNOSIS — J90 Pleural effusion, not elsewhere classified: Secondary | ICD-10-CM | POA: Diagnosis not present

## 2023-08-03 DIAGNOSIS — M549 Dorsalgia, unspecified: Secondary | ICD-10-CM | POA: Diagnosis not present

## 2023-08-05 ENCOUNTER — Encounter: Payer: Self-pay | Admitting: Internal Medicine

## 2023-08-05 ENCOUNTER — Other Ambulatory Visit: Payer: Self-pay | Admitting: Internal Medicine

## 2023-08-05 ENCOUNTER — Ambulatory Visit: Payer: BC Managed Care – PPO | Attending: Family Medicine | Admitting: Occupational Therapy

## 2023-08-05 ENCOUNTER — Encounter: Payer: Self-pay | Admitting: Student in an Organized Health Care Education/Training Program

## 2023-08-05 DIAGNOSIS — R600 Localized edema: Secondary | ICD-10-CM | POA: Insufficient documentation

## 2023-08-05 DIAGNOSIS — R278 Other lack of coordination: Secondary | ICD-10-CM | POA: Insufficient documentation

## 2023-08-05 DIAGNOSIS — M6281 Muscle weakness (generalized): Secondary | ICD-10-CM | POA: Diagnosis not present

## 2023-08-05 DIAGNOSIS — M79602 Pain in left arm: Secondary | ICD-10-CM | POA: Insufficient documentation

## 2023-08-05 DIAGNOSIS — R29818 Other symptoms and signs involving the nervous system: Secondary | ICD-10-CM | POA: Insufficient documentation

## 2023-08-05 DIAGNOSIS — R29898 Other symptoms and signs involving the musculoskeletal system: Secondary | ICD-10-CM | POA: Diagnosis not present

## 2023-08-05 DIAGNOSIS — R609 Edema, unspecified: Secondary | ICD-10-CM | POA: Diagnosis not present

## 2023-08-05 DIAGNOSIS — M79601 Pain in right arm: Secondary | ICD-10-CM | POA: Diagnosis not present

## 2023-08-05 DIAGNOSIS — C3490 Malignant neoplasm of unspecified part of unspecified bronchus or lung: Secondary | ICD-10-CM

## 2023-08-05 DIAGNOSIS — M25641 Stiffness of right hand, not elsewhere classified: Secondary | ICD-10-CM | POA: Diagnosis not present

## 2023-08-05 NOTE — Therapy (Signed)
 OUTPATIENT OCCUPATIONAL THERAPY NEURO TREATMENT  Patient Name: Brian [name] MRN: [id number] DOB:1961/07/13, 63 y.o., male Today's Date: 08/05/2023  PCP: Lamar Simpers REFERRING PROVIDER: Venetia Potters  END OF SESSION:  OT End of Session - 08/05/23 0853     Visit Number 25    Number of Visits 35    Date for OT Re-Evaluation 09/15/23    Authorization Type [employer]  $1600 deductible has been met  20% coinsurance applies  $8500 oop, zero applied  VL combined with pt, ot, st: 60    Authorization Time Period Renewal completed on 07/15/23    OT Start Time 0853    OT Stop Time 0931    OT Time Calculation (min) 38 min    Equipment Utilized During Treatment Blocks; coban    Activity Tolerance Patient tolerated treatment well    Behavior During Therapy WFL for tasks assessed/performed             Past Medical History:  Diagnosis Date   COPD (chronic obstructive pulmonary disease) (HCC)    Diabetes mellitus without complication (HCC)    Hypertension    nscl ca dx'd 08/2018   Pneumonia    Subarachnoid hemorrhage (HCC)    Past Surgical History:  Procedure Laterality Date   INGUINAL HERNIA REPAIR Right 06/24/2020   Procedure: RIGHT OPEN INGUINAL HERNIA REPAIR WITH MESH;  Surgeon: Kinsinger, Herlene Righter, MD;  Location: [facility];  Service: General;  Laterality: Right;   IR IVC FILTER PLMT / S&I PORTER GUID/MOD SED  03/04/2020   IR IVC FILTER RETRIEVAL / S&I PORTER GUID/MOD SED  08/15/2020   IR RADIOLOGIST EVAL & MGMT  08/06/2020   LEG SURGERY  age 38    left leg, from fracture   Patient Active Problem List   Diagnosis Date Noted   Complex regional pain syndrome type 1 of left lower extremity 07/20/2023   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 07/20/2023   Chronic pain syndrome 07/20/2023   Complex regional pain syndrome type 1 of right upper extremity 05/04/2023   Right arm pain 05/04/2023   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   Recurrent right pleural effusion 07/23/2021    Hypothyroidism 10/24/2020   Acute hypoxemic respiratory failure (HCC) 05/19/2020   Acute respiratory failure with hypoxia (HCC) 05/18/2020   Essential hypertension 05/18/2020   Type 2 diabetes mellitus with hyperlipidemia (HCC) 05/18/2020   S/P thoracentesis 03/21/2020   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/05/2020   Calf swelling 02/21/2020   Rash 02/07/2020   Adenocarcinoma of right lung, stage 4 (HCC) 02/07/2020   Chronic cough 12/20/2019   Venous thrombosis 09/21/2019   Shortness of breath 09/07/2019   Headache 08/09/2019   Lobar pneumonia, unspecified organism (HCC) 03/07/2019   Centrilobular emphysema (HCC) 03/07/2019   Encounter for antineoplastic immunotherapy 11/10/2018   Adenocarcinoma of right lung, stage 3 (HCC) 08/25/2018   Goals of care, counseling/discussion 08/25/2018   Encounter for antineoplastic chemotherapy 08/25/2018   Mass of upper lobe of right lung 08/04/2018   Subarachnoid hemorrhage (HCC) 05/22/2015    ONSET DATE: 03/17/23 Referral  REFERRING DIAG: UE Pain and paresthesias  As of 06/28/23 visit with Dr. Marcelino:  Complex regional pain syndrome type 1 of right upper extremity    2. Radiation-induced brachial plexopathy (RIGHT)   3. 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 Right arm pain   5. Chronic pain syndrome    THERAPY DIAG:  Muscle weakness (generalized)  Other lack of coordination  Edema, unspecified type  Pain  in right arm  Pain in left arm  Rationale for Evaluation and Treatment: Rehabilitation  SUBJECTIVE:   SUBJECTIVE STATEMENT:  Pt asked about effectiveness of kinesiotape/ultrasound - Pt reports kinesiotape may have helped but not sure about changes with use of US .  This OT hasn't seen pt in awhile and he reported that his L side is mimicking the loss of function he had on his R side.  He is still not able to find an edema glove he can apply to R hand.  Pt reports he is awaiting on date for spinal stimulator  installation.  Pt accompanied by: self  PERTINENT HISTORY: Lung cancer w/ radiation, [facility] 2016. Pt reports developing symptoms RUE this February and worsening symptoms in Rt hand more recently - MD thinks this is due to radiation approx 4 years ago  PRECAUTIONS: None  WEIGHT BEARING RESTRICTIONS: No  PAIN:  Are you having pain? Yes: NPRS scale: 4/10 - gets worse near end of day Pain location: right arm/shoulder - mostly shoulder but also into hand and also reports it is starting to get tingling on L side as well into his hand Pain description: sharp, stabbing, throbbing, aching Aggravating factors: idle - positional Relieving factors: heat, ice, gabapentin  Tingling in fingers is worse 3/10  FALLS: Has patient fallen in last 6 months? Yes. Number of falls 1  LIVING ENVIRONMENT: Lives with: lives with their family and lives with their spouse Lives in: House/apartment Stairs: Yes: External: 2 steps; none Has following equipment at home:  has built in shower seat  PLOF: Independent with basic ADLs  PATIENT GOALS: to be able to get back to some level of use of my right hand  BELOW OBJECTIVE INFO AT ORIGINAL EVALUATION DATE BUT HAS DECLINED SINCE EVALUATION:   HAND DOMINANCE: Right  ADLs: Overall ADLs: min assist and increased time Transfers/ambulation related to ADLs: Eating: min assist - wife assists with cutting.  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  Still driving - uses LUE only at this point Handwriting: unable  POSTURE COMMENTS:  forward head and posterior pelvic tilt  ACTIVITY TOLERANCE: Activity tolerance: reports  more fatigue due to constant pain  UPPER EXTREMITY ROM:    Active ROM Right eval Left eval  Shoulder flexion 115 unable to maintain ext elbow 140  Shoulder abduction    Shoulder adduction    Shoulder extension    Shoulder internal rotation    Shoulder external rotation    Elbow flexion WFL   Elbow extension WFL   Wrist flexion    Wrist extension 30   Wrist ulnar deviation 10   Wrist radial deviation 5   Wrist pronation    Wrist supination    (Blank rows = not tested)  UPPER EXTREMITY MMT:     MMT Right eval Left eval  Shoulder flexion NT 4-/5  Shoulder abduction    Shoulder adduction    Shoulder extension    Shoulder internal rotation    Shoulder external rotation    Middle trapezius    Lower trapezius    Elbow flexion    Elbow extension    Wrist flexion 3   Wrist extension 3   Wrist ulnar deviation  Wrist radial deviation    Wrist pronation    Wrist supination    (Blank rows = not tested) RIGHT HAND - 15% COMPOSITE FLEXION     - 40% COMPOSITE EXTENSION HAND FUNCTION: Grip strength: Right: UNABLE lbs; Left: 31.9 lbs  COORDINATION: Finger Nose Finger test: Mild undershooting Left - right lacks strength/ proximal control to complete  SENSATION: WFL to testing, but reports heightened sensation - hypersensitive  EDEMA: Atrophy RUE - UPPER ARM, FOREARM  MUSCLE TONE: RUE: Moderate and Hypotonic and LUE: Within functional limits  COGNITION: Overall cognitive status: Within functional limits for tasks assessed.  Reports some concentration deficits which he attributes to pain  VISION: Subjective report: no changes Baseline vision: Wears glasses all the time Visual history:  nothing significant  VISION ASSESSMENT: Not tested  Patient has difficulty with following activities due to following visual impairments: NA  PERCEPTION: WFL  PRAXIS: WFL   TODAY'S TREATMENT:                                                                                                                               DATE:   08/05/23 Pt reports he is doing okay with use of foam handle to help with eating with his L hand.  He was shown a universal cuff as an option if he has difficulty with holding tubing for a longer period of time.  Pt engaged in trial of edema reduction techniques with single coban wrap x 20 minutes from MCPs up forearm during motor activities with L hand.  Hand 9.5 before wrap and < 9 upon removal. Forearm also improved 1/4.  Pt engaged in grasp with LUE to pick up colored blocks and stack them with L UE extremity. He could stack 1 and 3/4 blocks.  He could lift a 1 + 3/4 block but struggled with picking up 2 - 1 blocks simultaneously.  He did eventually did get 2 - 1 blocks off the table and back into the bowl but as noted struggled with weight/size difference between objects.   Pt was engaged in big, deliberate movements and stretching of L digits/thumb to help with ability to open his hand/digits big enough to grasp objects easier and self reported need to do this at home.  He was engaged in stretching L arm forward on table top, opening his fingers as much as he could, stretching fingers open and then returning to picking up objects.   08/02/23 Patient attended aquatic therapy visit, entering and exiting the pool independently at stairs with left hand rail.  Patient with very forward head posture, atrophy noted in right shoulder girdle- inferior subluxation, and elevated scapula.  In seated position worked to float right hand at surface and realign/ support humeral head and scapula.  Patient reports reduction in pain with UE support.  Worked on reducing scapular elevation, and activating shoulder depressors.  Used property of buoyancy to allow patient free movement in arms in gravity  reduced environment.  Unable to sustain grasp on water  dumb bell in left hand this session.  Patient very concerned regarding continuing to lose strength in left  side.  Scheduled for MRI tomorrow, and hopes to have schedule for stimulator later this week.    PATIENT EDUCATION: Education details: Big, deliberate movements to open L hand Person educated: Patient Education method: Explanation, Demonstration, and Verbal cues Education comprehension: verbalized understanding and returned demonstration  HOME EXERCISE PROGRAM: 04/14/23: bed positioning, AA/ROM HEP for RUE 04/22/23: passive stretching HEP for Rt hand, edema management strategies 07/22/23: contrast bath info 07/29/2023: swelling reduction; coordination HEP; NMES applications   GOALS: (SEE NEW STG's and LTG's further below for renewal/recertification period) Goals reviewed with patient? Yes  SHORT TERM GOALS: Target date: 04/30/23  Patient will complete HEP designed to improve Bilateral shoulder range of motion  Goal status:MET  2.  Patient will complete HEP designed to improve R elbow flex/ext strength  Goal status: MET  3.  Patient will complete HEP designed to improve range of motion for composite flexion and extension in right hand/wrist  Goal status: MET  4.  Patient will demonstrate ability to pick up and retrieve lightweight object from table with RUE - e.g. cell phone  Goal status: NOT MET   5.  Patient will report improved awareness of positional changes/ support techniques to reduce UE pain.    Goal status: MET   6.  Patient will demonstrate 50% composite active flex/ext in right hand  Goal status: NOT MET - Will continue to work on some active movement as able   LONG TERM GOALS: Target date: 06/28/23  Patient will complete updated HEP designed on UE strengthening without increase in pain  Goal status: IN PROGRESS - Will need modifications due to decline in RUE and even LUE  2.  Patient will demonstrate 10 lb grip strength R hand  Goal status: NOT MET - no active movement Rt hand  3.  Patient will demonstrate low reach RUE to obtain and release a 2 lb  object x 5 without dropping  Goal status: NOT MET - pt only has scapula movement and some limited movement in gravity elim AA/ROM   4.  Patient will be able to cut his own food with modified independence  Goal status: NOT MET  5.  Patient will be independent with aquatic exercise program addressing BUE active movement and strength  Goal status: IN PROGRESS  6.  Patient will dress himself with modified independence  Goal status: NOT MET (Pt only doing 20%)    NEW STG's (due 08/15/23)   1.  Pt will demo 90% or greater P/ROM Rt hand  Baseline: 75% - 80% Goal status: IN PROGRESS  2.  Continue to address edema management and pain management strategies in RUE and Rt hand Baseline:  Goal status: IN PROGRESS  3.  Provide HEP for LUE strength and Lt grip strength  Baseline:  Goal status: IN PROGRESS  4.  Pt to verbalize understanding with compensatory strategies to increase independence with ADLS (particularly dressing, cutting food, opening containers/jars) including one handed techniques and A/E recommendations Baseline:  Goal status: IN PROGRESS  NEW LTG's (due 09/15/23)    1.  Pt to improve grip strength Lt hand by at least 5 lbs  Baseline: 31.9 at initial eval but had declined to 6.8 lbs at recertification Goal status: IN PROGRESS  2.  Improve coordination Lt hand as evidenced by reducing speed on 9 hole peg test by  10 sec or more Baseline:  64 sec on recert date 07/15/23 Goal status: IN PROGRESS  3.  Pt will dress w/ no more than min assist and will cut food mod I level w/ A/E Baseline:  Goal status: IN PROGRESS   4. Pt will be independent with aquatic exercise program addressing BUE active movement and strength    Goal status: IN PROGRESS    ASSESSMENT:  CLINICAL IMPRESSION: Pt demonstrates good understanding of recommendations today as needed to progress towards goals. Recommend use of Kinesiotape, wrapping or potentially custom edema glove for to R dorsal  wrist with education with spouse for carryover at home. Pt continues to benefit from skilled OT services in the outpatient setting to work on BUE impairments to help pt maintain max functional use of LUE and maximize comfort of RUE.  PERFORMANCE DEFICITS: in functional skills including ADLs, IADLs, coordination, dexterity, proprioception, sensation, edema, tone, ROM, strength, pain, muscle spasms, Fine motor control, Gross motor control, endurance, decreased knowledge of use of DME, and UE functional use  IMPAIRMENTS: are limiting patient from ADLs, IADLs, rest and sleep, work, and leisure.   CO-MORBIDITIES: has co-morbidities such as CRPS  that affects occupational performance. Patient will benefit from skilled OT to address above impairments and improve overall function.  REHAB POTENTIAL: Good   PLAN:  OT FREQUENCY: 2x/week  OT DURATION: 8 ADDITIONAL weeks  PLANNED INTERVENTIONS: 97168 OT Re-evaluation, 97535 self care/ADL training, 02889 therapeutic exercise, 97530 therapeutic activity, 97112 neuromuscular re-education, 97140 manual therapy, 97113 aquatic therapy, 97035 ultrasound, 97018 paraffin, 02960 fluidotherapy, 97010 moist heat, 97010 cryotherapy, 97034 contrast bath, 97032 electrical stimulation (manual), 97014 electrical stimulation unattended, 97760 Splinting (initial encounter), passive range of motion, functional mobility training, coping strategies training, patient/family education, and DME and/or AE instructions  RECOMMENDED OTHER SERVICES: may have potential for counseling - discussed with patient - he feels he is ok now, but will keep it in mind  CONSULTED AND AGREED WITH PLAN OF CARE: Patient  PLAN FOR NEXT SESSION:  Continue aquatic therapy to address gentle motion / gentle resistance with less influence of gravity - ALSO incorporate LUE into aquatic therapy d/t decline LUE.    Land therapy: Gentle wt bearing over UE's in standing w/ modifications to wrist/hand prn   consider compression wrapping for Rt hand Consider session with wife for wrapping fingers/hand per handouts noted below and/or kinesiotape .edemawrappingfingers .edemawrappinghand  LUE strengthening HEP for arm     Clarita LITTIE Pride, OT 08/05/2023, 12:55 PM

## 2023-08-06 ENCOUNTER — Encounter: Payer: Self-pay | Admitting: Physician Assistant

## 2023-08-09 ENCOUNTER — Ambulatory Visit: Payer: BC Managed Care – PPO | Admitting: Occupational Therapy

## 2023-08-09 ENCOUNTER — Encounter: Payer: Self-pay | Admitting: Occupational Therapy

## 2023-08-09 DIAGNOSIS — R29898 Other symptoms and signs involving the musculoskeletal system: Secondary | ICD-10-CM

## 2023-08-09 DIAGNOSIS — M6281 Muscle weakness (generalized): Secondary | ICD-10-CM | POA: Diagnosis not present

## 2023-08-09 DIAGNOSIS — R29818 Other symptoms and signs involving the nervous system: Secondary | ICD-10-CM

## 2023-08-09 DIAGNOSIS — R609 Edema, unspecified: Secondary | ICD-10-CM

## 2023-08-09 DIAGNOSIS — M25641 Stiffness of right hand, not elsewhere classified: Secondary | ICD-10-CM

## 2023-08-09 DIAGNOSIS — R278 Other lack of coordination: Secondary | ICD-10-CM | POA: Diagnosis not present

## 2023-08-09 DIAGNOSIS — M79601 Pain in right arm: Secondary | ICD-10-CM | POA: Diagnosis not present

## 2023-08-09 DIAGNOSIS — M79602 Pain in left arm: Secondary | ICD-10-CM

## 2023-08-09 DIAGNOSIS — R600 Localized edema: Secondary | ICD-10-CM | POA: Diagnosis not present

## 2023-08-09 NOTE — Therapy (Signed)
 OUTPATIENT OCCUPATIONAL THERAPY NEURO TREATMENT  Patient Name: Brian [name] MRN: [id number] DOB:Sep 17, 1960, 63 y.o., male Today's Date: 08/09/2023  PCP: Lamar Simpers REFERRING PROVIDER: Venetia Potters  END OF SESSION:  OT End of Session - 08/09/23 1619     Visit Number 26    Number of Visits 35    Date for OT Re-Evaluation 09/15/23    Authorization Type [employer]  $1600 deductible has been met  20% coinsurance applies  $8500 oop, zero applied  VL combined with pt, ot, st: 60    Authorization Time Period Renewal completed on 07/15/23    OT Start Time 1545    OT Stop Time 1615    OT Time Calculation (min) 30 min    Activity Tolerance Patient limited by fatigue    Behavior During Therapy [facility] for tasks assessed/performed             Past Medical History:  Diagnosis Date   COPD (chronic obstructive pulmonary disease) (HCC)    Diabetes mellitus without complication (HCC)    Hypertension    nscl ca dx'd 08/2018   Pneumonia    Subarachnoid hemorrhage (HCC)    Past Surgical History:  Procedure Laterality Date   INGUINAL HERNIA REPAIR Right 06/24/2020   Procedure: RIGHT OPEN INGUINAL HERNIA REPAIR WITH MESH;  Surgeon: Kinsinger, Herlene Righter, MD;  Location: [facility];  Service: General;  Laterality: Right;   IR IVC FILTER PLMT / S&I PORTER GUID/MOD SED  03/04/2020   IR IVC FILTER RETRIEVAL / S&I PORTER GUID/MOD SED  08/15/2020   IR RADIOLOGIST EVAL & MGMT  08/06/2020   LEG SURGERY  age 26    left leg, from fracture   Patient Active Problem List   Diagnosis Date Noted   Complex regional pain syndrome type 1 of left lower extremity 07/20/2023   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 07/20/2023   Chronic pain syndrome 07/20/2023   Complex regional pain syndrome type 1 of right upper extremity 05/04/2023   Right arm pain 05/04/2023   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   Recurrent right pleural effusion 07/23/2021   Hypothyroidism 10/24/2020   Acute hypoxemic respiratory failure  (HCC) 05/19/2020   Acute respiratory failure with hypoxia (HCC) 05/18/2020   Essential hypertension 05/18/2020   Type 2 diabetes mellitus with hyperlipidemia (HCC) 05/18/2020   S/P thoracentesis 03/21/2020   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/05/2020   Calf swelling 02/21/2020   Rash 02/07/2020   Adenocarcinoma of right lung, stage 4 (HCC) 02/07/2020   Chronic cough 12/20/2019   Venous thrombosis 09/21/2019   Shortness of breath 09/07/2019   Headache 08/09/2019   Lobar pneumonia, unspecified organism (HCC) 03/07/2019   Centrilobular emphysema (HCC) 03/07/2019   Encounter for antineoplastic immunotherapy 11/10/2018   Adenocarcinoma of right lung, stage 3 (HCC) 08/25/2018   Goals of care, counseling/discussion 08/25/2018   Encounter for antineoplastic chemotherapy 08/25/2018   Mass of upper lobe of right lung 08/04/2018   Subarachnoid hemorrhage (HCC) 05/22/2015    ONSET DATE: 03/17/23 Referral  REFERRING DIAG: UE Pain and paresthesias  As of 06/28/23 visit with Dr. Marcelino:  Complex regional pain syndrome type 1 of right upper extremity    2. Radiation-induced brachial plexopathy (RIGHT)   3. 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 Right arm pain   5. Chronic pain syndrome    THERAPY DIAG:  Muscle weakness (generalized)  Edema, unspecified type  Other lack of coordination  Stiffness of right hand, not elsewhere classified  Pain in  right arm  Edema of hand  Other symptoms and signs involving the musculoskeletal system  Pain in left arm  Other symptoms and signs involving the nervous system  Rationale for Evaluation and Treatment: Rehabilitation  SUBJECTIVE:   SUBJECTIVE STATEMENT:  Pt asked about effectiveness of kinesiotape/ultrasound - Pt reports kinesiotape may have helped but not sure about changes with use of US .  This OT hasn't seen pt in awhile and he reported that his L side is mimicking the loss of function he had on his R side.  He is still  not able to find an edema glove he can apply to R hand.  Pt reports he is awaiting on date for spinal stimulator installation.  Pt accompanied by: self  PERTINENT HISTORY: Lung cancer w/ radiation, SAH 2016. Pt reports developing symptoms RUE this February and worsening symptoms in Rt hand more recently - MD thinks this is due to radiation approx 4 years ago  PRECAUTIONS: None  WEIGHT BEARING RESTRICTIONS: No  PAIN:  Are you having pain? Yes: NPRS scale: 4/10 - gets worse near end of day Pain location: right arm/shoulder - mostly shoulder but also into hand and also reports it is starting to get tingling on L side as well into his hand Pain description: sharp, stabbing, throbbing, aching Aggravating factors: idle - positional Relieving factors: heat, ice, gabapentin  Tingling in fingers is worse 3/10  FALLS: Has patient fallen in last 6 months? Yes. Number of falls 1  LIVING ENVIRONMENT: Lives with: lives with their family and lives with their spouse Lives in: House/apartment Stairs: Yes: External: 2 steps; none Has following equipment at home:  has built in shower seat  PLOF: Independent with basic ADLs  PATIENT GOALS: to be able to get back to some level of use of my right hand  BELOW OBJECTIVE INFO AT ORIGINAL EVALUATION DATE BUT HAS DECLINED SINCE EVALUATION:   HAND DOMINANCE: Right  ADLs: Overall ADLs: min assist and increased time Transfers/ambulation related to ADLs: Eating: min assist - wife assists with cutting.  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  Still driving - uses LUE only at this point Handwriting:  unable  POSTURE COMMENTS:  forward head and posterior pelvic tilt  ACTIVITY TOLERANCE: Activity tolerance: reports more fatigue due to constant pain  UPPER EXTREMITY ROM:    Active ROM Right eval Left eval  Shoulder flexion 115 unable to maintain ext elbow 140  Shoulder abduction    Shoulder adduction    Shoulder extension    Shoulder internal rotation    Shoulder external rotation    Elbow flexion WFL   Elbow extension WFL   Wrist flexion    Wrist extension 30   Wrist ulnar deviation 10   Wrist radial deviation 5   Wrist pronation    Wrist supination    (Blank rows = not tested)  UPPER EXTREMITY MMT:     MMT Right eval Left eval  Shoulder flexion NT 4-/5  Shoulder abduction    Shoulder adduction    Shoulder extension    Shoulder internal rotation    Shoulder external rotation    Middle trapezius    Lower trapezius    Elbow flexion  Elbow extension    Wrist flexion 3   Wrist extension 3   Wrist ulnar deviation    Wrist radial deviation    Wrist pronation    Wrist supination    (Blank rows = not tested) RIGHT HAND - 15% COMPOSITE FLEXION     - 40% COMPOSITE EXTENSION HAND FUNCTION: Grip strength: Right: UNABLE lbs; Left: 31.9 lbs  COORDINATION: Finger Nose Finger test: Mild undershooting Left - right lacks strength/ proximal control to complete  SENSATION: WFL to testing, but reports heightened sensation - hypersensitive  EDEMA: Atrophy RUE - UPPER ARM, FOREARM  MUSCLE TONE: RUE: Moderate and Hypotonic and LUE: Within functional limits  COGNITION: Overall cognitive status: Within functional limits for tasks assessed.  Reports some concentration deficits which he attributes to pain  VISION: Subjective report: no changes Baseline vision: Wears glasses all the time Visual history:  nothing significant  VISION ASSESSMENT: Not tested  Patient has difficulty with following activities due to following visual impairments: NA  PERCEPTION:  WFL  PRAXIS: WFL   TODAY'S TREATMENT:                                                                                                                              DATE:  08/09/23 Patient awaiting MRI results.  Patient attended aquatic therapy visit, entering and exiting the pool independently at stairs with left hand rail.  In seated position worked to float right hand at surface and realign/ support humeral head and scapula- worked on active shoulder and elbow movement in RUE at surface of water  to reduce gravity.  Worked on reducing scapular elevation, and activating shoulder depressors.  Used property of buoyancy to allow patient free movement in arms in gravity reduced environment.  Unable to sustain grasp on water  dumb bell in left hand this session - used floatation strap to offer resistance to movement of LUE.   Supine floatation to address neck alignment in neutral versus forward and scap mobilization, arm on body and body on arm movement.    08/05/23 Pt reports he is doing okay with use of foam handle to help with eating with his L hand.  He was shown a universal cuff as an option if he has difficulty with holding tubing for a longer period of time.  Pt engaged in trial of edema reduction techniques with single coban wrap x 20 minutes from MCPs up forearm during motor activities with L hand.  Hand 9.5 before wrap and < 9 upon removal. Forearm also improved 1/4.  Pt engaged in grasp with LUE to pick up colored blocks and stack them with L UE extremity. He could stack 1 and 3/4 blocks.  He could lift a 1 + 3/4 block but struggled with picking up 2 - 1 blocks simultaneously.  He did eventually did get 2 - 1 blocks off the table and back into the bowl but as noted struggled with weight/size difference between objects.  Pt was engaged in big, deliberate movements and stretching of L digits/thumb to help with ability to open his hand/digits big enough to grasp objects easier and self  reported need to do this at home.  He was engaged in stretching L arm forward on table top, opening his fingers as much as he could, stretching fingers open and then returning to picking up objects.    PATIENT EDUCATION: Education details: Big, deliberate movements to open L hand Person educated: Patient Education method: Explanation, Demonstration, and Verbal cues Education comprehension: verbalized understanding and returned demonstration  HOME EXERCISE PROGRAM: 04/14/23: bed positioning, AA/ROM HEP for RUE 04/22/23: passive stretching HEP for Rt hand, edema management strategies 07/22/23: contrast bath info 07/29/2023: swelling reduction; coordination HEP; NMES applications   GOALS: (SEE NEW STG's and LTG's further below for renewal/recertification period) Goals reviewed with patient? Yes  SHORT TERM GOALS: Target date: 04/30/23  Patient will complete HEP designed to improve Bilateral shoulder range of motion  Goal status:MET  2.  Patient will complete HEP designed to improve R elbow flex/ext strength  Goal status: MET  3.  Patient will complete HEP designed to improve range of motion for composite flexion and extension in right hand/wrist  Goal status: MET  4.  Patient will demonstrate ability to pick up and retrieve lightweight object from table with RUE - e.g. cell phone  Goal status: NOT MET   5.  Patient will report improved awareness of positional changes/ support techniques to reduce UE pain.    Goal status: MET   6.  Patient will demonstrate 50% composite active flex/ext in right hand  Goal status: NOT MET - Will continue to work on some active movement as able   LONG TERM GOALS: Target date: 06/28/23  Patient will complete updated HEP designed on UE strengthening without increase in pain  Goal status: IN PROGRESS - Will need modifications due to decline in RUE and even LUE  2.  Patient will demonstrate 10 lb grip strength R hand  Goal status: NOT MET -  no active movement Rt hand  3.  Patient will demonstrate low reach RUE to obtain and release a 2 lb object x 5 without dropping  Goal status: NOT MET - pt only has scapula movement and some limited movement in gravity elim AA/ROM   4.  Patient will be able to cut his own food with modified independence  Goal status: NOT MET  5.  Patient will be independent with aquatic exercise program addressing BUE active movement and strength  Goal status: IN PROGRESS  6.  Patient will dress himself with modified independence  Goal status: NOT MET (Pt only doing 20%)    NEW STG's (due 08/15/23)   1.  Pt will demo 90% or greater P/ROM Rt hand  Baseline: 75% - 80% Goal status: IN PROGRESS  2.  Continue to address edema management and pain management strategies in RUE and Rt hand Baseline:  Goal status: IN PROGRESS  3.  Provide HEP for LUE strength and Lt grip strength  Baseline:  Goal status: IN PROGRESS  4.  Pt to verbalize understanding with compensatory strategies to increase independence with ADLS (particularly dressing, cutting food, opening containers/jars) including one handed techniques and A/E recommendations Baseline:  Goal status: IN PROGRESS  NEW LTG's (due 09/15/23)    1.  Pt to improve grip strength Lt hand by at least 5 lbs  Baseline: 31.9 at initial eval but had declined to 6.8 lbs at recertification  Goal status: IN PROGRESS  2.  Improve coordination Lt hand as evidenced by reducing speed on 9 hole peg test by 10 sec or more Baseline:  64 sec on recert date 07/15/23 Goal status: IN PROGRESS  3.  Pt will dress w/ no more than min assist and will cut food mod I level w/ A/E Baseline:  Goal status: IN PROGRESS   4. Pt will be independent with aquatic exercise program addressing BUE active movement and strength    Goal status: IN PROGRESS    ASSESSMENT:  CLINICAL IMPRESSION: Pt demonstrates good understanding of recommendations today as needed to progress  towards goals. Recommend use of Kinesiotape, wrapping or potentially custom edema glove for to R dorsal wrist with education with spouse for carryover at home. Pt continues to benefit from skilled OT services in the outpatient setting to work on BUE impairments to help pt maintain max functional use of LUE and maximize comfort of RUE.  PERFORMANCE DEFICITS: in functional skills including ADLs, IADLs, coordination, dexterity, proprioception, sensation, edema, tone, ROM, strength, pain, muscle spasms, Fine motor control, Gross motor control, endurance, decreased knowledge of use of DME, and UE functional use  IMPAIRMENTS: are limiting patient from ADLs, IADLs, rest and sleep, work, and leisure.   CO-MORBIDITIES: has co-morbidities such as CRPS  that affects occupational performance. Patient will benefit from skilled OT to address above impairments and improve overall function.  REHAB POTENTIAL: Good   PLAN:  OT FREQUENCY: 2x/week  OT DURATION: 8 ADDITIONAL weeks  PLANNED INTERVENTIONS: 97168 OT Re-evaluation, 97535 self care/ADL training, 02889 therapeutic exercise, 97530 therapeutic activity, 97112 neuromuscular re-education, 97140 manual therapy, 97113 aquatic therapy, 97035 ultrasound, 97018 paraffin, 02960 fluidotherapy, 97010 moist heat, 97010 cryotherapy, 97034 contrast bath, 97032 electrical stimulation (manual), 97014 electrical stimulation unattended, 97760 Splinting (initial encounter), passive range of motion, functional mobility training, coping strategies training, patient/family education, and DME and/or AE instructions  RECOMMENDED OTHER SERVICES: may have potential for counseling - discussed with patient - he feels he is ok now, but will keep it in mind  CONSULTED AND AGREED WITH PLAN OF CARE: Patient  PLAN FOR NEXT SESSION:  Continue aquatic therapy to address gentle motion / gentle resistance with less influence of gravity - ALSO incorporate LUE into aquatic therapy d/t  decline LUE.    Land therapy: Gentle wt bearing over UE's in standing w/ modifications to wrist/hand prn  consider compression wrapping for Rt hand Consider session with wife for wrapping fingers/hand per handouts noted below and/or kinesiotape .edemawrappingfingers .edemawrappinghand  LUE strengthening HEP for arm     Fransico Allean HERO, OT 08/09/2023, 4:22 PM

## 2023-08-12 ENCOUNTER — Inpatient Hospital Stay: Payer: BC Managed Care – PPO | Attending: Internal Medicine

## 2023-08-12 ENCOUNTER — Ambulatory Visit: Payer: BC Managed Care – PPO | Admitting: Occupational Therapy

## 2023-08-12 ENCOUNTER — Other Ambulatory Visit: Payer: Self-pay | Admitting: Internal Medicine

## 2023-08-12 ENCOUNTER — Telehealth: Payer: Self-pay | Admitting: Internal Medicine

## 2023-08-12 DIAGNOSIS — R29898 Other symptoms and signs involving the musculoskeletal system: Secondary | ICD-10-CM | POA: Diagnosis not present

## 2023-08-12 DIAGNOSIS — R609 Edema, unspecified: Secondary | ICD-10-CM | POA: Diagnosis not present

## 2023-08-12 DIAGNOSIS — C3490 Malignant neoplasm of unspecified part of unspecified bronchus or lung: Secondary | ICD-10-CM

## 2023-08-12 DIAGNOSIS — R29818 Other symptoms and signs involving the nervous system: Secondary | ICD-10-CM

## 2023-08-12 DIAGNOSIS — C3411 Malignant neoplasm of upper lobe, right bronchus or lung: Secondary | ICD-10-CM | POA: Insufficient documentation

## 2023-08-12 DIAGNOSIS — R278 Other lack of coordination: Secondary | ICD-10-CM | POA: Diagnosis not present

## 2023-08-12 DIAGNOSIS — Z923 Personal history of irradiation: Secondary | ICD-10-CM | POA: Insufficient documentation

## 2023-08-12 DIAGNOSIS — Z79899 Other long term (current) drug therapy: Secondary | ICD-10-CM | POA: Diagnosis not present

## 2023-08-12 DIAGNOSIS — M6281 Muscle weakness (generalized): Secondary | ICD-10-CM

## 2023-08-12 DIAGNOSIS — Z9221 Personal history of antineoplastic chemotherapy: Secondary | ICD-10-CM | POA: Diagnosis not present

## 2023-08-12 DIAGNOSIS — M79601 Pain in right arm: Secondary | ICD-10-CM

## 2023-08-12 DIAGNOSIS — M25641 Stiffness of right hand, not elsewhere classified: Secondary | ICD-10-CM | POA: Diagnosis not present

## 2023-08-12 DIAGNOSIS — M79602 Pain in left arm: Secondary | ICD-10-CM

## 2023-08-12 DIAGNOSIS — R600 Localized edema: Secondary | ICD-10-CM | POA: Diagnosis not present

## 2023-08-12 LAB — CBC WITH DIFFERENTIAL (CANCER CENTER ONLY)
Abs Immature Granulocytes: 0.07 10*3/uL (ref 0.00–0.07)
Basophils Absolute: 0.2 10*3/uL — ABNORMAL HIGH (ref 0.0–0.1)
Basophils Relative: 1 %
Eosinophils Absolute: 0.4 10*3/uL (ref 0.0–0.5)
Eosinophils Relative: 3 %
HCT: 34 % — ABNORMAL LOW (ref 39.0–52.0)
Hemoglobin: 11.7 g/dL — ABNORMAL LOW (ref 13.0–17.0)
Immature Granulocytes: 1 %
Lymphocytes Relative: 13 %
Lymphs Abs: 1.7 10*3/uL (ref 0.7–4.0)
MCH: 31 pg (ref 26.0–34.0)
MCHC: 34.4 g/dL (ref 30.0–36.0)
MCV: 89.9 fL (ref 80.0–100.0)
Monocytes Absolute: 1.9 10*3/uL — ABNORMAL HIGH (ref 0.1–1.0)
Monocytes Relative: 15 %
Neutro Abs: 8.6 10*3/uL — ABNORMAL HIGH (ref 1.7–7.7)
Neutrophils Relative %: 67 %
Platelet Count: 352 10*3/uL (ref 150–400)
RBC: 3.78 MIL/uL — ABNORMAL LOW (ref 4.22–5.81)
RDW: 16.2 % — ABNORMAL HIGH (ref 11.5–15.5)
WBC Count: 12.8 10*3/uL — ABNORMAL HIGH (ref 4.0–10.5)
nRBC: 0 % (ref 0.0–0.2)

## 2023-08-12 LAB — CMP (CANCER CENTER ONLY)
ALT: 18 U/L (ref 0–44)
AST: 20 U/L (ref 15–41)
Albumin: 4 g/dL (ref 3.5–5.0)
Alkaline Phosphatase: 197 U/L — ABNORMAL HIGH (ref 38–126)
Anion gap: 6 (ref 5–15)
BUN: 19 mg/dL (ref 6–20)
CO2: 30 mmol/L (ref 22–32)
Calcium: 9 mg/dL (ref 8.9–10.3)
Chloride: 104 mmol/L (ref 98–111)
Creatinine: 0.98 mg/dL (ref 0.61–1.24)
GFR, Estimated: >60 mL/min (ref >60)
Glucose, Bld: 92 mg/dL (ref 70–99)
Potassium: 4 mmol/L (ref 3.5–5.1)
Sodium: 140 mmol/L (ref 135–145)
Total Bilirubin: 0.7 mg/dL (ref 0.0–1.2)
Total Protein: 6.6 g/dL (ref 6.5–8.1)

## 2023-08-12 NOTE — Therapy (Signed)
 OUTPATIENT OCCUPATIONAL THERAPY NEURO TREATMENT  Patient Name: Brian VALLETTA MRN: [id number] DOB:1961/03/25, 63 y.o., male Today's Date: 08/12/2023  PCP: Lamar Simpers REFERRING PROVIDER: Venetia Potters  END OF SESSION:  OT End of Session - 08/12/23 0942     Visit Number 27    Number of Visits 35    Date for OT Re-Evaluation 09/15/23    Authorization Type [employer]  $1600 deductible has been met  20% coinsurance applies  $8500 oop, zero applied  VL combined with pt, ot, st: 60    Authorization Time Period Renewal completed on 07/15/23    OT Start Time 0937    OT Stop Time 1015    OT Time Calculation (min) 38 min    Activity Tolerance Patient limited by fatigue    Behavior During Therapy [facility] for tasks assessed/performed             Past Medical History:  Diagnosis Date   COPD (chronic obstructive pulmonary disease) (HCC)    Diabetes mellitus without complication (HCC)    Hypertension    nscl ca dx'd 08/2018   Pneumonia    Subarachnoid hemorrhage (HCC)    Past Surgical History:  Procedure Laterality Date   INGUINAL HERNIA REPAIR Right 06/24/2020   Procedure: RIGHT OPEN INGUINAL HERNIA REPAIR WITH MESH;  Surgeon: Kinsinger, Herlene Righter, MD;  Location: [facility];  Service: General;  Laterality: Right;   IR IVC FILTER PLMT / S&I PORTER GUID/MOD SED  03/04/2020   IR IVC FILTER RETRIEVAL / S&I PORTER GUID/MOD SED  08/15/2020   IR RADIOLOGIST EVAL & MGMT  08/06/2020   LEG SURGERY  age 20    left leg, from fracture   Patient Active Problem List   Diagnosis Date Noted   Complex regional pain syndrome type 1 of left lower extremity 07/20/2023   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 07/20/2023   Chronic pain syndrome 07/20/2023   Complex regional pain syndrome type 1 of right upper extremity 05/04/2023   Right arm pain 05/04/2023   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   Recurrent right pleural effusion 07/23/2021   Hypothyroidism 10/24/2020   Acute hypoxemic respiratory failure  (HCC) 05/19/2020   Acute respiratory failure with hypoxia (HCC) 05/18/2020   Essential hypertension 05/18/2020   Type 2 diabetes mellitus with hyperlipidemia (HCC) 05/18/2020   S/P thoracentesis 03/21/2020   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/05/2020   Calf swelling 02/21/2020   Rash 02/07/2020   Adenocarcinoma of right lung, stage 4 (HCC) 02/07/2020   Chronic cough 12/20/2019   Venous thrombosis 09/21/2019   Shortness of breath 09/07/2019   Headache 08/09/2019   Lobar pneumonia, unspecified organism (HCC) 03/07/2019   Centrilobular emphysema (HCC) 03/07/2019   Encounter for antineoplastic immunotherapy 11/10/2018   Adenocarcinoma of right lung, stage 3 (HCC) 08/25/2018   Goals of care, counseling/discussion 08/25/2018   Encounter for antineoplastic chemotherapy 08/25/2018   Mass of upper lobe of right lung 08/04/2018   Subarachnoid hemorrhage (HCC) 05/22/2015    ONSET DATE: 03/17/23 Referral  REFERRING DIAG: UE Pain and paresthesias  As of 06/28/23 visit with Dr. Marcelino:  Complex regional pain syndrome type 1 of right upper extremity    2. Radiation-induced brachial plexopathy (RIGHT)   3. 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 Right arm pain   5. Chronic pain syndrome    THERAPY DIAG:  Stiffness of right hand, not elsewhere classified  Other lack of coordination  Muscle weakness (generalized)  Pain in left arm  Pain  in right arm  Other symptoms and signs involving the nervous system  Other symptoms and signs involving the musculoskeletal system  Rationale for Evaluation and Treatment: Rehabilitation  SUBJECTIVE:   SUBJECTIVE STATEMENT:  Pt gets external nerve stimulator placed on 08/25/23.  I have pain in both shoulders and electrical jolts more on Rt side but some on Lt side. Continued decline Lt hand. No grip strength on Lt side (uses tenodesis w/ very little movement).  Some difficulty swallowing food now - pt thinks medicine causing dry mouth  which contributes to difficulty, no difficulty swallowing liquids  Pt accompanied by: self  PERTINENT HISTORY: Lung cancer w/ radiation, SAH 2016. Pt reports developing symptoms RUE this February and worsening symptoms in Rt hand more recently - MD thinks this is due to radiation approx 4 years ago  PRECAUTIONS: None  WEIGHT BEARING RESTRICTIONS: No  PAIN:  Are you having pain? Yes: NPRS scale: 4/10 - gets worse near end of day Pain location: right arm/shoulder - mostly shoulder but also into hand and also reports it is starting to get tingling on L side as well into his hand Pain description: sharp, stabbing, throbbing, aching Aggravating factors: idle - positional Relieving factors: heat, ice, gabapentin  Tingling in fingers is worse 3/10  FALLS: Has patient fallen in last 6 months? Yes. Number of falls 1  LIVING ENVIRONMENT: Lives with: lives with their family and lives with their spouse Lives in: House/apartment Stairs: Yes: External: 2 steps; none Has following equipment at home:  has built in shower seat  PLOF: Independent with basic ADLs  PATIENT GOALS: to be able to get back to some level of use of my right hand  BELOW OBJECTIVE INFO AT ORIGINAL EVALUATION DATE BUT HAS DECLINED SINCE EVALUATION:   HAND DOMINANCE: Right  ADLs: Overall ADLs: min assist and increased time Transfers/ambulation related to ADLs: Eating: min assist - wife assists with cutting.  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  Still driving - uses LUE only at this point Handwriting: unable  POSTURE COMMENTS:  forward head and posterior pelvic  tilt  ACTIVITY TOLERANCE: Activity tolerance: reports more fatigue due to constant pain  UPPER EXTREMITY ROM:    Active ROM Right eval Left eval  Shoulder flexion 115 unable to maintain ext elbow 140  Shoulder abduction    Shoulder adduction    Shoulder extension    Shoulder internal rotation    Shoulder external rotation    Elbow flexion WFL   Elbow extension WFL   Wrist flexion    Wrist extension 30   Wrist ulnar deviation 10   Wrist radial deviation 5   Wrist pronation    Wrist supination    (Blank rows = not tested)  UPPER EXTREMITY MMT:     MMT Right eval Left eval  Shoulder flexion NT 4-/5  Shoulder abduction    Shoulder adduction    Shoulder extension    Shoulder internal rotation    Shoulder external rotation    Middle trapezius    Lower trapezius    Elbow flexion    Elbow extension    Wrist flexion 3   Wrist extension 3   Wrist ulnar deviation    Wrist  radial deviation    Wrist pronation    Wrist supination    (Blank rows = not tested) RIGHT HAND - 15% COMPOSITE FLEXION     - 40% COMPOSITE EXTENSION HAND FUNCTION: Grip strength: Right: UNABLE lbs; Left: 31.9 lbs  COORDINATION: Finger Nose Finger test: Mild undershooting Left - right lacks strength/ proximal control to complete  SENSATION: WFL to testing, but reports heightened sensation - hypersensitive  EDEMA: Atrophy RUE - UPPER ARM, FOREARM  MUSCLE TONE: RUE: Moderate and Hypotonic and LUE: Within functional limits  COGNITION: Overall cognitive status: Within functional limits for tasks assessed.  Reports some concentration deficits which he attributes to pain  VISION: Subjective report: no changes Baseline vision: Wears glasses all the time Visual history:  nothing significant  VISION ASSESSMENT: Not tested  Patient has difficulty with following activities due to following visual impairments: NA  PERCEPTION: WFL  PRAXIS: WFL   TODAY'S TREATMENT:                                                                                                                               DATE: 08/12/23  Pt reports some difficulty swallowing food d/t dry mouth. No difficulty swallowing water /liquids  Re-tested LUE strength:  shoulder flex and abd 4+/5, biceps 5/5, triceps 3+/5, forearm sup/pron 3+/5, wrist ext 4+/5,  Lt grip strength unreadable (0 lbs) and now using tenodesis w/ only 50% or less ROM in Lt hand  Pt now having difficulty holding fork/spoon even with built up foam - pt shown universal cuff for this. Pt also shown silicone EZ hold for electric toothbrush. Discussed options for holding coffee mug and suggested travel style mug w/ large handle to just place hand in.   Due to continued decline - will need further A/E exploration to maintain as much independence as able   08/09/23 Patient awaiting MRI results.  Patient attended aquatic therapy visit, entering and exiting the pool independently at stairs with left hand rail.  In seated position worked to float right hand at surface and realign/ support humeral head and scapula- worked on active shoulder and elbow movement in RUE at surface of water  to reduce gravity.  Worked on reducing scapular elevation, and activating shoulder depressors.  Used property of buoyancy to allow patient free movement in arms in gravity reduced environment.  Unable to sustain grasp on water  dumb bell in left hand this session - used floatation strap to offer resistance to movement of LUE.   Supine floatation to address neck alignment in neutral versus forward and scap mobilization, arm on body and body on arm movement.      PATIENT EDUCATION: Education details: Big, deliberate movements to open L hand Person educated: Patient Education method: Explanation, Demonstration, and Verbal cues Education comprehension: verbalized understanding and returned demonstration  HOME EXERCISE PROGRAM: 04/14/23: bed positioning, AA/ROM HEP for RUE 04/22/23:  passive stretching HEP for Rt hand, edema management strategies 07/22/23: contrast bath info 07/29/2023:  swelling reduction; coordination HEP; NMES applications   GOALS: (SEE NEW STG's and LTG's further below for renewal/recertification period) Goals reviewed with patient? Yes  SHORT TERM GOALS: Target date: 04/30/23  Patient will complete HEP designed to improve Bilateral shoulder range of motion  Goal status:MET  2.  Patient will complete HEP designed to improve R elbow flex/ext strength  Goal status: MET  3.  Patient will complete HEP designed to improve range of motion for composite flexion and extension in right hand/wrist  Goal status: MET  4.  Patient will demonstrate ability to pick up and retrieve lightweight object from table with RUE - e.g. cell phone  Goal status: NOT MET   5.  Patient will report improved awareness of positional changes/ support techniques to reduce UE pain.    Goal status: MET   6.  Patient will demonstrate 50% composite active flex/ext in right hand  Goal status: NOT MET - Will continue to work on some active movement as able   LONG TERM GOALS: Target date: 06/28/23  Patient will complete updated HEP designed on UE strengthening without increase in pain  Goal status: IN PROGRESS - Will need modifications due to decline in RUE and even LUE  2.  Patient will demonstrate 10 lb grip strength R hand  Goal status: NOT MET - no active movement Rt hand  3.  Patient will demonstrate low reach RUE to obtain and release a 2 lb object x 5 without dropping  Goal status: NOT MET - pt only has scapula movement and some limited movement in gravity elim AA/ROM   4.  Patient will be able to cut his own food with modified independence  Goal status: NOT MET  5.  Patient will be independent with aquatic exercise program addressing BUE active movement and strength  Goal status: IN PROGRESS  6.  Patient will dress himself with modified  independence  Goal status: NOT MET (Pt only doing 20%)    NEW STG's (due 08/15/23)   1.  Pt will demo 90% or greater P/ROM Rt hand  Baseline: 75% - 80% Goal status: IN PROGRESS  2.  Continue to address edema management and pain management strategies in RUE and Rt hand Baseline:  Goal status: IN PROGRESS  3.  Provide HEP for LUE strength and Lt grip strength  Baseline:  Goal status: IN PROGRESS  4.  Pt to verbalize understanding with compensatory strategies to increase independence with ADLS (particularly dressing, cutting food, opening containers/jars) including one handed techniques and A/E recommendations Baseline:  Goal status: IN PROGRESS  NEW LTG's (due 09/15/23)    1.  Pt to improve grip strength Lt hand by at least 5 lbs  Baseline: 31.9 at initial eval but had declined to 6.8 lbs at recertification Goal status: IN PROGRESS  2.  Improve coordination Lt hand as evidenced by reducing speed on 9 hole peg test by 10 sec or more Baseline:  64 sec on recert date 07/15/23 Goal status: IN PROGRESS  3.  Pt will dress w/ no more than min assist and will cut food mod I level w/ A/E Baseline:  Goal status: IN PROGRESS   4. Pt will be independent with aquatic exercise program addressing BUE active movement and strength    Goal status: IN PROGRESS    ASSESSMENT:  CLINICAL IMPRESSION: Pt continues to demo decline in Lt hand function and grip strength, however LUE proximal ROM and strength still WFL's. Pt continues to benefit from skilled OT  services in the outpatient setting to work on BUE impairments to help pt maintain max functional use of LUE and maximize comfort of RUE.  PERFORMANCE DEFICITS: in functional skills including ADLs, IADLs, coordination, dexterity, proprioception, sensation, edema, tone, ROM, strength, pain, muscle spasms, Fine motor control, Gross motor control, endurance, decreased knowledge of use of DME, and UE functional use  IMPAIRMENTS: are limiting  patient from ADLs, IADLs, rest and sleep, work, and leisure.   CO-MORBIDITIES: has co-morbidities such as CRPS  that affects occupational performance. Patient will benefit from skilled OT to address above impairments and improve overall function.  REHAB POTENTIAL: Good   PLAN:  OT FREQUENCY: 2x/week  OT DURATION: 8 ADDITIONAL weeks  PLANNED INTERVENTIONS: 97168 OT Re-evaluation, 97535 self care/ADL training, 02889 therapeutic exercise, 97530 therapeutic activity, 97112 neuromuscular re-education, 97140 manual therapy, 97113 aquatic therapy, 97035 ultrasound, 97018 paraffin, 02960 fluidotherapy, 97010 moist heat, 97010 cryotherapy, 97034 contrast bath, 97032 electrical stimulation (manual), 97014 electrical stimulation unattended, 97760 Splinting (initial encounter), passive range of motion, functional mobility training, coping strategies training, patient/family education, and DME and/or AE instructions  RECOMMENDED OTHER SERVICES: may have potential for counseling - discussed with patient - he feels he is ok now, but will keep it in mind  CONSULTED AND AGREED WITH PLAN OF CARE: Patient  PLAN FOR NEXT SESSION:  Continue aquatic therapy to address gentle motion / gentle resistance with less influence of gravity - ALSO incorporate LUE into aquatic therapy d/t decline LUE.    Land therapy: Gentle wt bearing over UE's in standing w/ modifications to wrist/hand prn  consider compression wrapping for Rt hand Consider session with wife for wrapping fingers/hand per handouts noted below and/or kinesiotape .edemawrappingfingers .edemawrappinghand  ? Consider perineal hygiene care and A/E prn    Burnard JINNY Roads, OT 08/12/2023, 9:49 AM

## 2023-08-13 ENCOUNTER — Ambulatory Visit (HOSPITAL_COMMUNITY)
Admission: RE | Admit: 2023-08-13 | Discharge: 2023-08-13 | Disposition: A | Discharge status: Home / Self Care | Payer: BC Managed Care – PPO | Source: Ambulatory Visit | Attending: Internal Medicine | Admitting: Internal Medicine

## 2023-08-13 ENCOUNTER — Other Ambulatory Visit: Payer: Self-pay | Admitting: Internal Medicine

## 2023-08-13 DIAGNOSIS — C3490 Malignant neoplasm of unspecified part of unspecified bronchus or lung: Secondary | ICD-10-CM | POA: Insufficient documentation

## 2023-08-13 DIAGNOSIS — J90 Pleural effusion, not elsewhere classified: Secondary | ICD-10-CM | POA: Diagnosis not present

## 2023-08-13 DIAGNOSIS — K4020 Bilateral inguinal hernia, without obstruction or gangrene, not specified as recurrent: Secondary | ICD-10-CM | POA: Diagnosis not present

## 2023-08-13 DIAGNOSIS — I700 Atherosclerosis of aorta: Secondary | ICD-10-CM | POA: Diagnosis not present

## 2023-08-13 DIAGNOSIS — K5641 Fecal impaction: Secondary | ICD-10-CM | POA: Diagnosis not present

## 2023-08-13 MED ORDER — IOHEXOL 300 MG/ML  SOLN
100.0000 mL | Freq: Once | INTRAMUSCULAR | Status: AC | PRN
  Administered 2023-08-13: 100 mL via INTRAVENOUS

## 2023-08-14 ENCOUNTER — Other Ambulatory Visit: Payer: Self-pay | Admitting: Student in an Organized Health Care Education/Training Program

## 2023-08-14 DIAGNOSIS — G540 Brachial plexus disorders: Secondary | ICD-10-CM

## 2023-08-14 DIAGNOSIS — G894 Chronic pain syndrome: Secondary | ICD-10-CM

## 2023-08-14 DIAGNOSIS — M792 Neuralgia and neuritis, unspecified: Secondary | ICD-10-CM

## 2023-08-14 DIAGNOSIS — G90511 Complex regional pain syndrome I of right upper limb: Secondary | ICD-10-CM

## 2023-08-16 ENCOUNTER — Ambulatory Visit: Payer: BC Managed Care – PPO | Admitting: Occupational Therapy

## 2023-08-18 ENCOUNTER — Inpatient Hospital Stay (HOSPITAL_BASED_OUTPATIENT_CLINIC_OR_DEPARTMENT_OTHER): Payer: BC Managed Care – PPO | Admitting: Internal Medicine

## 2023-08-18 VITALS — BP 124/82 | HR 91 | Temp 98.0°F | Resp 17 | Ht 68.0 in | Wt 177.6 lb

## 2023-08-18 DIAGNOSIS — C3491 Malignant neoplasm of unspecified part of right bronchus or lung: Secondary | ICD-10-CM | POA: Diagnosis not present

## 2023-08-18 DIAGNOSIS — Z79899 Other long term (current) drug therapy: Secondary | ICD-10-CM | POA: Diagnosis not present

## 2023-08-18 DIAGNOSIS — Z9221 Personal history of antineoplastic chemotherapy: Secondary | ICD-10-CM | POA: Diagnosis not present

## 2023-08-18 DIAGNOSIS — Z923 Personal history of irradiation: Secondary | ICD-10-CM | POA: Diagnosis not present

## 2023-08-18 DIAGNOSIS — C3411 Malignant neoplasm of upper lobe, right bronchus or lung: Secondary | ICD-10-CM | POA: Diagnosis not present

## 2023-08-18 MED ORDER — METHYLPREDNISOLONE 4 MG PO TBPK: ORAL_TABLET | ORAL | 0 refills | Status: DC

## 2023-08-18 NOTE — Progress Notes (Signed)
 [facility] [phone]   Fax:([phone]  OFFICE PROGRESS NOTE  [name], MD No address on file  DIAGNOSIS:  Metastatic non-small cell lung cancer initially diagnosed as stage IIIB (T1c, N3, M0) non-small cell lung cancer, adenocarcinoma presented with right upper lobe lung mass in addition to mediastinal and bilateral supraclavicular lymphadenopathy diagnosed in January 2020.  He had evidence of disease recurrence in June 2021 with adenopathy in the subcarinal, level 3 left neck lymph node, and lymph node at the curious of the diaphragm and in the upper abdomen.   PD-L1: 10%   Guardant 360 molecular studies showed no actionable mutation   Foundation One Testing:   Biomarker Findings Microsatellite status - MS-Stable Tumor Mutational Burden - 4 Muts/Mb Genomic Findings For a complete list of the genes assayed, please refer to the Appendix. ALK EML4-ALK fusion (Variant 2) SMARCB1 R377C CTNNB1 S45P CDKN2A/B CDKN2B loss, CDKN2A loss CXCR4 W106* TP53 splice site 672G>A 7 Disease relevant genes with no reportable alterations: BRAF, EGFR, ERBB2, KRAS, MET, RET, ROS1  PRIOR THERAPY: 1) Concurrent chemoradiation with weekly carboplatin  for AUC of 2 and paclitaxel  45 mg/M2.  Status post 6 cycles.  Last dose was given on October 10, 2018 with stable disease. 2) Radiation treatment to the enlarging right cervical lymph nodes under the care of Dr. Eloise Hake. First treatment 03/06/2019. Last treatment scheduled on 04/13/2019 3) Consolidation treatment with immunotherapy with Imfinzi  10 mg/KG every 2 weeks.  First dose November 17, 2018.  Status post 26 cycles. 4) Systemic chemotherapy with carboplatin  for an AUC of 5, Alimta  500 mg/m2, and Keytruda  200 mg IV every 3 weeks. First dose expected on 02/14/20. Status post 1 cycle.  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 5)  SBRT to the left lower lobe lung nodule.   CURRENT THERAPY:  [name] 600 mg p.o. twice daily.  He started the first dose on March 08, 2020.   Status post 41 months of treatment.  INTERVAL HISTORY: Brian [name] 63 y.o. male turns to the clinic today for follow-up visit.Discussed the use of AI scribe software for clinical note transcription with the patient, who gave verbal consent to proceed.  History of Present Illness   Brian [name], a 63 year old patient with a history of adenocarcinoma, has been on alectinib for 41 months following a positive ALK gene translocation. The patient reports experiencing nerve issues, described as sharp electrical jolts, throbbing pain, and a sensation akin to pins and needles, in the right arm for the past year. The symptoms, which start in the tricep and radiate down to the forearm and into the pinky and ring finger, have recently begun to manifest in the left arm as well.  In addition to the nerve issues, the patient has developed a dry cough over the past month and a half. Despite this, recent scans of the chest, abdomen, and pelvis have shown no significant changes, with the exception of a moderate right pleural effusion.  The patient's current medication regimen includes gabapentin , Aleve, Effexor , and Cymbalta , but he reports no significant improvement in symptoms. He is also undergoing occupational therapy twice a week.  The patient continues to work approximately six hours a day despite these health issues. The last MRI of the brain was conducted in March.       MEDICAL HISTORY: Past Medical History:  Diagnosis Date   COPD (  chronic obstructive pulmonary disease) (HCC)    Diabetes mellitus without complication (HCC)    Hypertension    nscl ca dx'd 08/2018   Pneumonia    Subarachnoid hemorrhage (HCC)     ALLERGIES:  has no known allergies.  MEDICATIONS:  Current Outpatient Medications  Medication Sig Dispense Refill   albuterol   (VENTOLIN  HFA) 108 (90 Base) MCG/ACT inhaler INHALE 2 PUFFS INTO THE LUNGS EVERY 4 (FOUR) HOURS AS NEEDED FOR WHEEZING OR SHORTNESS OF BREATH. 6.7 each 4   [name]  150 MG capsule TAKE 4 CAPSULES BY MOUTH TWICE DAILY WITH FOOD. SWALLOW CAPSULES WHOLE. DO NOT OPEN OR CRUSH. STORE IN ORIGINAL CONTAINER 240 capsule 2   amitriptyline  (ELAVIL ) 25 MG tablet Take 3 tablets (75 mg total) by mouth at bedtime. 90 tablet 0   amLODipine  (NORVASC ) 2.5 MG tablet Take 2.5 mg by mouth daily.     atorvastatin  (LIPITOR) 20 MG tablet Take 20 mg by mouth daily.     baclofen  (LIORESAL ) 10 MG tablet Take 1 tablet (10 mg total) by mouth daily. 30 each 11   DULoxetine  (CYMBALTA ) 60 MG capsule Take 1 capsule (60 mg total) by mouth daily. 90 capsule 3   gabapentin  (NEURONTIN ) 600 MG tablet Take 1 tablet (600 mg) midday and take 4 tablets (2400 mg) at bedtime (Patient taking differently: Take 1,200 mg by mouth at bedtime. Take 1 tablet (600 mg) midday and take 4 tablets (2400 mg) at bedtime) 120 tablet 5   naproxen sodium (ALEVE) 220 MG tablet Take 220 mg by mouth.     TRELEGY ELLIPTA  100-62.5-25 MCG/INH AEPB Inhale 1 puff into the lungs daily.     venlafaxine  (EFFEXOR ) 37.5 MG tablet Take 37.5 mg by mouth daily.     No current facility-administered medications for this visit.    SURGICAL HISTORY:  Past Surgical History:  Procedure Laterality Date   INGUINAL HERNIA REPAIR Right 06/24/2020   Procedure: RIGHT OPEN INGUINAL HERNIA REPAIR WITH MESH;  Surgeon: Kinsinger, Alphonso Aschoff, MD;  Location: [facility];  Service: General;  Laterality: Right;   IR IVC FILTER PLMT / S&I Dan Dun GUID/MOD SED  03/04/2020   IR IVC FILTER RETRIEVAL / S&I Dan Dun GUID/MOD SED  08/15/2020   IR RADIOLOGIST EVAL & MGMT  08/06/2020   LEG SURGERY  age 35    left leg, from fracture    REVIEW OF SYSTEMS:  Constitutional: positive for fatigue Eyes: negative Ears, nose, mouth, throat, and face: negative Respiratory: positive for cough and dyspnea on  exertion Cardiovascular: negative Gastrointestinal: negative Genitourinary:negative Integument/breast: negative Hematologic/lymphatic: negative Musculoskeletal:positive for arthralgias and muscle weakness Neurological: positive for paresthesia and weakness Behavioral/Psych: negative Endocrine: negative Allergic/Immunologic: negative   PHYSICAL EXAMINATION: General appearance: alert, cooperative, fatigued, and no distress Head: Normocephalic, without obvious abnormality, atraumatic Neck: no JVD, supple, symmetrical, trachea midline, and thyroid : enlarged Lymph nodes: Cervical, supraclavicular, and axillary nodes normal. Resp: clear to auscultation bilaterally Back: symmetric, no curvature. ROM normal. No CVA tenderness. Cardio: regular rate and rhythm, S1, S2 normal, no murmur, click, rub or gallop GI: soft, non-tender; bowel sounds normal; no masses,  no organomegaly Extremities: extremities normal, atraumatic, no cyanosis or edema Neurologic: Alert and oriented X 3, normal strength and tone. Normal symmetric reflexes. Normal coordination and gait  ECOG PERFORMANCE STATUS: 1 - Symptomatic but completely ambulatory  Blood pressure 124/82, pulse 91, temperature 98 F (36.7 C), resp. rate 17, height 5\' 8"  (1.727 m), weight 177 lb 9.6 oz (80.6 kg), SpO2 98%.  LABORATORY DATA: Lab  Results  Component Value Date   WBC 12.8 (H) 08/12/2023   HGB 11.7 (L) 08/12/2023   HCT 34.0 (L) 08/12/2023   MCV 89.9 08/12/2023   PLT 352 08/12/2023      Chemistry      Component Value Date/Time   NA 140 08/12/2023 1618   K 4.0 08/12/2023 1618   CL 104 08/12/2023 1618   CO2 30 08/12/2023 1618   BUN 19 08/12/2023 1618   CREATININE 0.98 08/12/2023 1618      Component Value Date/Time   CALCIUM  9.0 08/12/2023 1618   ALKPHOS 197 (H) 08/12/2023 1618   AST 20 08/12/2023 1618   ALT 18 08/12/2023 1618   BILITOT 0.7 08/12/2023 1618       RADIOGRAPHIC STUDIES: MR THORACIC SPINE WO  CONTRAST Result Date: 08/14/2023 CLINICAL DATA:  Back pain, spinal cord stimulator planning EXAM: MRI THORACIC SPINE WITHOUT CONTRAST TECHNIQUE: Multiplanar, multisequence MR imaging of the thoracic spine was performed. No intravenous contrast was administered. COMPARISON:  No prior MRI of the thoracic spine available, correlation is made with CT chest abdomen pelvis 08/13/2023 FINDINGS: Alignment: S-shaped curvature of the thoracolumbar spine. No listhesis. Vertebrae: No acute fracture, evidence of discitis, or suspicious osseous lesion. Cord:  Normal signal and morphology. Paraspinal and other soft tissues: Moderate right pleural effusion, which appears similar to the subsequent 08/13/2023 chest CT. Disc levels: No significant disc bulges. No spinal canal stenosis or significant neural foraminal narrowing. IMPRESSION: 1. No spinal canal stenosis or significant neural foraminal narrowing in the thoracic spine. 2. Moderate right pleural effusion, which appears similar to the subsequent 08/13/2023 chest CT. Electronically Signed   By: Zoila Hines M.D.   On: 08/14/2023 17:23   CT CHEST ABDOMEN PELVIS W CONTRAST Result Date: 08/13/2023 CLINICAL DATA:  History of non-small cell lung cancer, follow-up. * Tracking Code: BO * EXAM: CT CHEST, ABDOMEN, AND PELVIS WITH CONTRAST TECHNIQUE: Multidetector CT imaging of the chest, abdomen and pelvis was performed following the standard protocol during bolus administration of intravenous contrast. RADIATION DOSE REDUCTION: This exam was performed according to the departmental dose-optimization program which includes automated exposure control, adjustment of the mA and/or kV according to patient size and/or use of iterative reconstruction technique. CONTRAST:  OMNIPAQUE  IOHEXOL  300 MG/ML  SOLN COMPARISON:  Multiple priors including most recent CT November 06, 2022. FINDINGS: CT CHEST FINDINGS Cardiovascular: Aortic atherosclerosis. No central pulmonary embolus on this  nondedicated study. Coronary artery calcifications. Normal size heart. No significant pericardial effusion/thickening. Mediastinum/Nodes: No suspicious thyroid  nodule. No pathologically enlarged mediastinal, hilar or axillary lymph nodes. Patulous esophagus. Lungs/Pleura: Central airways are patent. Similar masslike consolidation with internal air bronchograms and architectural distortion in the perihilar right lung compatible with postradiation change. No new suspicious pulmonary nodules or masses. Slightly increased size of the moderate right pleural effusion. No new suspicious pleural nodularity. Scattered atelectasis/scarring. Musculoskeletal: No aggressive lytic or blastic lesion of bone. Multilevel degenerative changes spine. CT ABDOMEN PELVIS FINDINGS Hepatobiliary: No suspicious hepatic lesion. Gallbladder is unremarkable. No biliary ductal dilation. Pancreas: No pancreatic ductal dilation or evidence of acute inflammation. Spleen: No splenomegaly. Adrenals/Urinary Tract: Bilateral adrenal glands appear normal. Left upper pole renal cyst is considered benign and requiring no independent imaging follow-up. No hydronephrosis. Kidneys demonstrate symmetric enhancement. Urinary bladder is unremarkable for degree of distension. Stomach/Bowel: Stomach is within normal limits. Appendix appears normal. No evidence of bowel wall thickening, distention, or inflammatory changes. Large volume of formed stool in the colon. Vascular/Lymphatic: Aortic atherosclerosis. Smooth IVC  contours. Normal caliber abdominal aorta. The portal, splenic and superior mesenteric veins are patent. No pathologically enlarged abdominal or pelvic lymph nodes Reproductive: Prostate is unremarkable. Other: Moderate bilateral fat containing inguinal hernias. No significant abdominopelvic free fluid. Musculoskeletal: No aggressive lytic or blastic lesion of bone. Multilevel degenerative changes spine. Degenerative change of the bilateral hips.  IMPRESSION: 1. Similar right paramediastinal postradiation change. No evidence of recurrent or metastatic disease. 2. Slightly increased size of the moderate right pleural effusion. No new suspicious pleural nodularity. 3. No evidence of metastatic disease in the abdomen or pelvis. 4. Large volume of formed stool in the colon. 5.  Aortic Atherosclerosis (ICD10-I70.0). Electronically Signed   By: Tama Fails M.D.   On: 08/13/2023 11:21   DG PAIN CLINIC C-ARM 1-60 MIN NO REPORT Result Date: 07/20/2023 Fluoro was used, but no Radiologist interpretation will be provided. Please refer to "NOTES" tab for provider progress note.    ASSESSMENT AND PLAN: This is a very pleasant 63 years old white male with metastatic non-small cell lung cancer, adenocarcinoma now with ALK gene translocation.  The patient was initially diagnosed as a stage IIIa non-small cell lung cancer status post a course of concurrent chemoradiation with weekly carboplatin  and paclitaxel  followed by 1 year of consolidation treatment with immunotherapy with Imfinzi . He had evidence for disease progression recently. Repeat tissue biopsy from the left supraclavicular lymph nodes and molecular studies showed that the patient has positive ALK gene translocation.  His previous molecular studies by Guardant 360 was negative. He received 1 cycle of systemic chemotherapy with carboplatin , Alimta  and Keytruda .  We will discontinue his systemic chemotherapy for now because of the new findings on the molecular studies. The patient is currently on treatment with [name]  600 mg p.o. twice daily.  He is status post 41 months of treatment.   He has been tolerating this treatment fairly well with no concerning adverse effects. He had repeat CT scan of the chest, abdomen and pelvis performed recently.  I personally and independently reviewed the scan and discussed the results with the patient today.  His scan showed no concerning findings for disease  recurrence or metastasis but there was slight increased in the size of the moderate right pleural effusion. Assessment and Plan    Adenocarcinoma with ALK gene translocation Diagnosed June 2020, initially treated with chemotherapy and radiation followed by durvalumab  for one year. ALK gene translocation identified June 2021, started alectinib August 2021. On alectinib for 41 months with good response. Recent scans show stable disease. Discussed continuing alectinib due to efficacy and positive response. - Continue alectinib - Repeat labs in two months - No scan next visit  Right Pleural Effusion Moderate right pleural effusion noted on recent scan, stable compared to previous imaging. Reports dry cough for past month and a half, possibly related to pleural effusion or COPD exacerbation. Discussed thoracentesis for relief and associated risks (infection, pneumothorax). - Perform ultrasound-guided thoracentesis - Refer to pulmonary medicine (Dr. Villa Greaser) - Prescribe Medrol  Dosepak  Chronic Obstructive Pulmonary Disease (COPD) COPD may be contributing to current cough. No recent steroid use. Discussed short-term steroid benefits for reducing inflammation and improving symptoms. - Refer to pulmonary medicine (Dr. 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 Symptoms include sharp electrical jolts, nerve firing, pain, throbbing, and pins and needles from tricep to pinky and ring finger, now affecting left side. Managed with gabapentin , Aleve, Effexor , Cymbalta , and occupational therapy. Discussed nerve  stimulator placement benefits and risks (infection, device malfunction). - Proceed with nerve stimulator placement next week - Continue current medications and occupational therapy  Follow-up - Schedule follow-up appointment in two months - Ensure repeat labs at next visit - Contact office if follow-up appointment  is not scheduled correctly.   The patient was advised to call immediately if he has any concerning symptoms in the interval. The patient voices understanding of current disease status and treatment options and is in agreement with the current care plan.  All questions were answered. The patient knows to call the clinic with any problems, questions or concerns. We can certainly see the patient much sooner if necessary.  Disclaimer: This note was dictated with voice recognition software. Similar sounding words can inadvertently be transcribed and may not be corrected upon review.

## 2023-08-19 ENCOUNTER — Ambulatory Visit: Payer: BC Managed Care – PPO | Admitting: Occupational Therapy

## 2023-08-19 ENCOUNTER — Encounter: Payer: Self-pay | Admitting: Occupational Therapy

## 2023-08-19 DIAGNOSIS — R278 Other lack of coordination: Secondary | ICD-10-CM

## 2023-08-19 DIAGNOSIS — M6281 Muscle weakness (generalized): Secondary | ICD-10-CM | POA: Diagnosis not present

## 2023-08-19 DIAGNOSIS — M79602 Pain in left arm: Secondary | ICD-10-CM | POA: Diagnosis not present

## 2023-08-19 DIAGNOSIS — M79601 Pain in right arm: Secondary | ICD-10-CM

## 2023-08-19 DIAGNOSIS — M25641 Stiffness of right hand, not elsewhere classified: Secondary | ICD-10-CM

## 2023-08-19 DIAGNOSIS — R609 Edema, unspecified: Secondary | ICD-10-CM | POA: Diagnosis not present

## 2023-08-19 DIAGNOSIS — R29818 Other symptoms and signs involving the nervous system: Secondary | ICD-10-CM | POA: Diagnosis not present

## 2023-08-19 DIAGNOSIS — R600 Localized edema: Secondary | ICD-10-CM | POA: Diagnosis not present

## 2023-08-19 DIAGNOSIS — R29898 Other symptoms and signs involving the musculoskeletal system: Secondary | ICD-10-CM | POA: Diagnosis not present

## 2023-08-19 NOTE — Therapy (Signed)
OUTPATIENT OCCUPATIONAL THERAPY NEURO TREATMENT  Patient Name: Brian [name] MRN: [id number] DOB:May 10, 1961, 63 y.o., male Today's Date: 08/19/2023  PCP: Keturah Barre REFERRING PROVIDER: Jacquelyne Balint  END OF SESSION:  OT End of Session - 08/19/23 0807     Visit Number 28    Number of Visits 35    Date for OT Re-Evaluation 09/15/23    Authorization Type [employer]  $1600 deductible has been met  20% coinsurance applies  $8500 oop, zero applied  VL combined with pt, ot, st: 60    Authorization Time Period Renewal completed on 07/15/23    OT Start Time 0805    OT Stop Time 0850    OT Time Calculation (min) 45 min    Activity Tolerance Patient limited by fatigue    Behavior During Therapy [facility] for tasks assessed/performed             Past Medical History:  Diagnosis Date   COPD (chronic obstructive pulmonary disease) (HCC)    Diabetes mellitus without complication (HCC)    Hypertension    nscl ca dx'd 08/2018   Pneumonia    Subarachnoid hemorrhage (HCC)    Past Surgical History:  Procedure Laterality Date   INGUINAL HERNIA REPAIR Right 06/24/2020   Procedure: RIGHT OPEN INGUINAL HERNIA REPAIR WITH MESH;  Surgeon: Kinsinger, De Blanch, MD;  Location: [facility];  Service: General;  Laterality: Right;   IR IVC FILTER PLMT / S&I Lenise Arena GUID/MOD SED  03/04/2020   IR IVC FILTER RETRIEVAL / S&I Lenise Arena GUID/MOD SED  08/15/2020   IR RADIOLOGIST EVAL & MGMT  08/06/2020   LEG SURGERY  age 67    left leg, from fracture   Patient Active Problem List   Diagnosis Date Noted   Complex regional pain syndrome type 1 of left lower extremity 07/20/2023   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 07/20/2023   Chronic pain syndrome 07/20/2023   Complex regional pain syndrome type 1 of right upper extremity 05/04/2023   Right arm pain 05/04/2023   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   Recurrent right pleural effusion 07/23/2021   Hypothyroidism 10/24/2020   Acute hypoxemic respiratory failure  (HCC) 05/19/2020   Acute respiratory failure with hypoxia (HCC) 05/18/2020   Essential hypertension 05/18/2020   Type 2 diabetes mellitus with hyperlipidemia (HCC) 05/18/2020   S/P thoracentesis 03/21/2020   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/05/2020   Calf swelling 02/21/2020   Rash 02/07/2020   Adenocarcinoma of right lung, stage 4 (HCC) 02/07/2020   Chronic cough 12/20/2019   Venous thrombosis 09/21/2019   Shortness of breath 09/07/2019   Headache 08/09/2019   Lobar pneumonia, unspecified organism (HCC) 03/07/2019   Centrilobular emphysema (HCC) 03/07/2019   Encounter for antineoplastic immunotherapy 11/10/2018   Adenocarcinoma of right lung, stage 3 (HCC) 08/25/2018   Goals of care, counseling/discussion 08/25/2018   Encounter for antineoplastic chemotherapy 08/25/2018   Mass of upper lobe of right lung 08/04/2018   Subarachnoid hemorrhage (HCC) 05/22/2015    ONSET DATE: 03/17/23 Referral  REFERRING DIAG: UE Pain and paresthesias  As of 06/28/23 visit with Dr. Cherylann Ratel:  Complex regional pain syndrome type 1 of right upper extremity    2. Radiation-induced brachial plexopathy (RIGHT)   3. 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 Right arm pain   5. Chronic pain syndrome    THERAPY DIAG:  Stiffness of right hand, not elsewhere classified  Pain in right arm  Pain in left arm  Edema of hand  Other  lack of coordination  Muscle weakness (generalized)  Other symptoms and signs involving the nervous system  Rationale for Evaluation and Treatment: Rehabilitation  SUBJECTIVE:   SUBJECTIVE STATEMENT:  Pt gets external nerve stimulator placed on 08/25/23.  I have pain in both shoulders and electrical jolts more on Rt side but some on Lt side. Continued decline Lt hand. No grip strength on Lt side (uses tenodesis w/ very little movement).  Some difficulty swallowing food now - pt thinks medicine causing dry mouth which contributes to difficulty, no difficulty  swallowing liquids  Pt accompanied by: self  PERTINENT HISTORY: Lung cancer w/ radiation, [facility] 2016. Pt reports developing symptoms RUE this February and worsening symptoms in Rt hand more recently - MD thinks this is due to radiation approx 4 years ago  PRECAUTIONS: None  WEIGHT BEARING RESTRICTIONS: No  PAIN:  Are you having pain? Yes: NPRS scale: 3/10 - gets worse near end of day, 1/10 LUE Pain location: right arm - more forearm today  Pain description: sharp, stabbing, throbbing, aching Aggravating factors: idle - positional Relieving factors: heat, ice, gabapentin Tingling in fingers is worse 3/10  FALLS: Has patient fallen in last 6 months? Yes. Number of falls 1  LIVING ENVIRONMENT: Lives with: lives with their family and lives with their spouse Lives in: House/apartment Stairs: Yes: External: 2 steps; none Has following equipment at home:  has built in shower seat  PLOF: Independent with basic ADLs  PATIENT GOALS: to be able to get back to some level of use of my right hand  BELOW OBJECTIVE INFO AT ORIGINAL EVALUATION DATE BUT HAS DECLINED SINCE EVALUATION:   HAND DOMINANCE: Right  ADLs: Overall ADLs: min assist and increased time Transfers/ambulation related to ADLs: Eating: min assist - wife assists with cutting.  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 driving - uses LUE only at this point Handwriting: unable  POSTURE COMMENTS:  forward head and posterior pelvic tilt  ACTIVITY TOLERANCE: Activity tolerance: reports more fatigue due to constant pain  UPPER EXTREMITY ROM:    Active ROM Right eval  Left eval  Shoulder flexion 115 unable to maintain ext elbow 140  Shoulder abduction    Shoulder adduction    Shoulder extension    Shoulder internal rotation    Shoulder external rotation    Elbow flexion WFL   Elbow extension WFL   Wrist flexion    Wrist extension 30   Wrist ulnar deviation 10   Wrist radial deviation 5   Wrist pronation    Wrist supination    (Blank rows = not tested)  UPPER EXTREMITY MMT:     MMT Right eval Left eval  Shoulder flexion NT 4-/5  Shoulder abduction    Shoulder adduction    Shoulder extension    Shoulder internal rotation    Shoulder external rotation    Middle trapezius    Lower trapezius    Elbow flexion    Elbow extension    Wrist flexion 3   Wrist extension 3   Wrist ulnar deviation    Wrist radial deviation    Wrist pronation    Wrist supination    (Blank rows = not tested) RIGHT HAND -  15% COMPOSITE FLEXION     - 40% COMPOSITE EXTENSION HAND FUNCTION: Grip strength: Right: UNABLE lbs; Left: 31.9 lbs  COORDINATION: Finger Nose Finger test: Mild undershooting Left - right lacks strength/ proximal control to complete  SENSATION: WFL to testing, but reports heightened sensation - hypersensitive  EDEMA: Atrophy RUE - UPPER ARM, FOREARM  MUSCLE TONE: RUE: Moderate and Hypotonic and LUE: Within functional limits  COGNITION: Overall cognitive status: Within functional limits for tasks assessed.  Reports some concentration deficits which he attributes to pain  VISION: Subjective report: no changes Baseline vision: Wears glasses all the time Visual history:  nothing significant  VISION ASSESSMENT: Not tested  Patient has difficulty with following activities due to following visual impairments: NA  PERCEPTION: WFL  PRAXIS: WFL   TODAY'S TREATMENT:                                                                                                                              DATE: 08/19/23  Continued exploration of  task modifications and possible A/E needs to maintain as much independence as able. Discussion with pt on tasks that have become very difficult or can no longer due. Discussed strategies for dressing. Pt also had questions re: using computer and mouse (for work). Looked at various mouse options and also recommended voice recognition software and contacting [employer]. Pt provided with handouts on A/E including: bath mitt, pants extender, typing aid, various writing aids, and contact info on [employer] program  Practiced writing w/ various adapted pens Lt non dominant hand with little success as pt does not have sufficient pressure to apply on pen to write. Recommended trying gel pen (easy glide pen) with built up handle first before pursuing/ordering other A/E. Pt had some success with slip on writing aid as well.   Pt also provided with info edema management with coban wrapping for Rt hand and verbally reviewed - see pt instructions for details (reviewed for wrapping fingers and hand). Pt encouraged to bring in his wife next session to practice if he desires, as pt will not be able to apply himself.     08/12/23 Re-tested LUE strength:  shoulder flex and abd 4+/5, biceps 5/5, triceps 3+/5, forearm sup/pron 3+/5, wrist ext 4+/5,  Lt grip strength unreadable (0 lbs) and now using tenodesis w/ only 50% or less ROM in Lt hand     08/09/23 Patient awaiting MRI results.  Patient attended aquatic therapy visit, entering and exiting the pool independently at stairs with left hand rail.  In seated position worked to float right hand at surface and realign/ support humeral head and scapula- worked on active shoulder and elbow movement in RUE at surface of water to reduce gravity.  Worked on reducing scapular elevation, and activating shoulder depressors.  Used property of buoyancy to allow patient free movement in arms in gravity reduced environment.  Unable to sustain grasp on water dumb bell in left  hand  this session - used floatation strap to offer resistance to movement of LUE.   Supine floatation to address neck alignment in neutral versus forward and scap mobilization, arm on body and body on arm movement.      PATIENT EDUCATION: Education details: Big, deliberate movements to open L hand Person educated: Patient Education method: Explanation, Demonstration, and Verbal cues Education comprehension: verbalized understanding and returned demonstration  HOME EXERCISE PROGRAM: 04/14/23: bed positioning, AA/ROM HEP for RUE 04/22/23: passive stretching HEP for Rt hand, edema management strategies 07/22/23: contrast bath info 07/29/2023: swelling reduction; coordination HEP; NMES applications   GOALS: (SEE NEW STG's and LTG's further below for renewal/recertification period) Goals reviewed with patient? Yes  SHORT TERM GOALS: Target date: 04/30/23  Patient will complete HEP designed to improve Bilateral shoulder range of motion  Goal status:MET  2.  Patient will complete HEP designed to improve R elbow flex/ext strength  Goal status: MET  3.  Patient will complete HEP designed to improve range of motion for composite flexion and extension in right hand/wrist  Goal status: MET  4.  Patient will demonstrate ability to pick up and retrieve lightweight object from table with RUE - e.g. cell phone  Goal status: NOT MET   5.  Patient will report improved awareness of positional changes/ support techniques to reduce UE pain.    Goal status: MET   6.  Patient will demonstrate 50% composite active flex/ext in right hand  Goal status: NOT MET - Will continue to work on some active movement as able   LONG TERM GOALS: Target date: 06/28/23  Patient will complete updated HEP designed on UE strengthening without increase in pain  Goal status: IN PROGRESS - Will need modifications due to decline in RUE and even LUE  2.  Patient will demonstrate 10 lb grip strength R  hand  Goal status: NOT MET - no active movement Rt hand  3.  Patient will demonstrate low reach RUE to obtain and release a 2 lb object x 5 without dropping  Goal status: NOT MET - pt only has scapula movement and some limited movement in gravity elim AA/ROM   4.  Patient will be able to cut his own food with modified independence  Goal status: NOT MET  5.  Patient will be independent with aquatic exercise program addressing BUE active movement and strength  Goal status: IN PROGRESS  6.  Patient will dress himself with modified independence  Goal status: NOT MET (Pt only doing 20%)    NEW STG's (due 08/15/23)   1.  Pt will demo 90% or greater P/ROM Rt hand  Baseline: 75% - 80% Goal status: IN PROGRESS  2.  Continue to address edema management and pain management strategies in RUE and Rt hand Baseline:  Goal status: IN PROGRESS  3.  Provide HEP for LUE strength and Lt grip strength  Baseline:  Goal status: IN PROGRESS  4.  Pt to verbalize understanding with compensatory strategies to increase independence with ADLS (particularly dressing, cutting food, opening containers/jars) including one handed techniques and A/E recommendations Baseline:  Goal status: IN PROGRESS  NEW LTG's (due 09/15/23)    1.  Pt to improve grip strength Lt hand by at least 5 lbs  Baseline: 31.9 at initial eval but had declined to 6.8 lbs at recertification Goal status: IN PROGRESS  2.  Improve coordination Lt hand as evidenced by reducing speed on 9 hole peg test by 10 sec or more Baseline:  64 sec on recert date 07/15/23 Goal status: IN PROGRESS  3.  Pt will dress w/ no more than min assist and will cut food mod I level w/ A/E Baseline:  Goal status: IN PROGRESS   4. Pt will be independent with aquatic exercise program addressing BUE active movement and strength    Goal status: IN PROGRESS    ASSESSMENT:  CLINICAL IMPRESSION: Pt continues to demo decline in Lt hand function and  grip strength, however LUE proximal ROM and strength still WFL's. Pt gets nerve stimulator next week and instructed to clearance to resume O.T. Pt continues to benefit from skilled OT services in the outpatient setting to work on BUE impairments to help pt maintain max functional use of LUE and maximize comfort of RUE.  PERFORMANCE DEFICITS: in functional skills including ADLs, IADLs, coordination, dexterity, proprioception, sensation, edema, tone, ROM, strength, pain, muscle spasms, Fine motor control, Gross motor control, endurance, decreased knowledge of use of DME, and UE functional use  IMPAIRMENTS: are limiting patient from ADLs, IADLs, rest and sleep, work, and leisure.   CO-MORBIDITIES: has co-morbidities such as CRPS  that affects occupational performance. Patient will benefit from skilled OT to address above impairments and improve overall function.  REHAB POTENTIAL: Good   PLAN:  OT FREQUENCY: 2x/week  OT DURATION: 8 ADDITIONAL weeks  PLANNED INTERVENTIONS: 97168 OT Re-evaluation, 97535 self care/ADL training, 21308 therapeutic exercise, 97530 therapeutic activity, 97112 neuromuscular re-education, 97140 manual therapy, 97113 aquatic therapy, 97035 ultrasound, 97018 paraffin, 65784 fluidotherapy, 97010 moist heat, 97010 cryotherapy, 97034 contrast bath, 97032 electrical stimulation (manual), 97014 electrical stimulation unattended, 97760 Splinting (initial encounter), passive range of motion, functional mobility training, coping strategies training, patient/family education, and DME and/or AE instructions  RECOMMENDED OTHER SERVICES: may have potential for counseling - discussed with patient - he feels he is ok now, but will keep it in mind  CONSULTED AND AGREED WITH PLAN OF CARE: Patient  PLAN FOR NEXT SESSION:  If MD clearance after nerve stimulator - continue aquatic therapy to address gentle motion / gentle resistance with less influence of gravity - ALSO incorporate LUE into  aquatic therapy d/t decline LUE.    If MD clearance to resume O.T. after nerve stimulator  - Land therapy: Gentle wt bearing over UE's in standing w/ modifications to wrist/hand prn  consider compression wrapping for Rt hand Consider session with wife for wrapping fingers/hand per handouts noted below and/or [email or web address] .[email or web address] .edemawrappinghand  ? Consider perineal hygiene care and A/E prn    Sheran Lawless, OT 08/19/2023, 9:45 AM

## 2023-08-19 NOTE — Patient Instructions (Signed)
 Brian [name]

## 2023-08-23 ENCOUNTER — Ambulatory Visit: Payer: BC Managed Care – PPO | Admitting: Occupational Therapy

## 2023-08-24 ENCOUNTER — Inpatient Hospital Stay: Payer: BC Managed Care – PPO | Admitting: Physician Assistant

## 2023-08-25 ENCOUNTER — Ambulatory Visit
Payer: BC Managed Care – PPO | Attending: Student in an Organized Health Care Education/Training Program | Admitting: Student in an Organized Health Care Education/Training Program

## 2023-08-25 ENCOUNTER — Ambulatory Visit
Admission: RE | Admit: 2023-08-25 | Discharge: 2023-08-25 | Disposition: A | Discharge status: Home / Self Care | Payer: BC Managed Care – PPO | Source: Ambulatory Visit | Attending: Student in an Organized Health Care Education/Training Program | Admitting: Student in an Organized Health Care Education/Training Program

## 2023-08-25 DIAGNOSIS — G90522 Complex regional pain syndrome I of left lower limb: Secondary | ICD-10-CM | POA: Insufficient documentation

## 2023-08-25 DIAGNOSIS — G540 Brachial plexus disorders: Secondary | ICD-10-CM | POA: Insufficient documentation

## 2023-08-25 DIAGNOSIS — M792 Neuralgia and neuritis, unspecified: Secondary | ICD-10-CM | POA: Diagnosis not present

## 2023-08-25 DIAGNOSIS — G894 Chronic pain syndrome: Secondary | ICD-10-CM | POA: Insufficient documentation

## 2023-08-25 DIAGNOSIS — G90511 Complex regional pain syndrome I of right upper limb: Secondary | ICD-10-CM | POA: Insufficient documentation

## 2023-08-25 MED ORDER — CEFAZOLIN SODIUM-DEXTROSE 2-4 GM/100ML-% IV SOLN
2.0000 g | INTRAVENOUS | Status: AC
  Administered 2023-08-25: 2 g via INTRAVENOUS
  Filled 2023-08-25: qty 100

## 2023-08-25 MED ORDER — FENTANYL CITRATE (PF) 100 MCG/2ML IJ SOLN
INTRAMUSCULAR | Status: AC
  Filled 2023-08-25: qty 2

## 2023-08-25 MED ORDER — MIDAZOLAM HCL 5 MG/5ML IJ SOLN
INTRAMUSCULAR | Status: AC
  Filled 2023-08-25: qty 5

## 2023-08-25 MED ORDER — LACTATED RINGERS IV SOLN: Freq: Once | INTRAVENOUS | Status: DC

## 2023-08-25 MED ORDER — CEPHALEXIN 500 MG PO CAPS: 500.0000 mg | ORAL_CAPSULE | Freq: Four times a day (QID) | ORAL | 0 refills | Status: AC

## 2023-08-25 MED ORDER — LIDOCAINE HCL 2 % IJ SOLN
INTRAMUSCULAR | Status: AC
  Filled 2023-08-25: qty 20

## 2023-08-25 MED ORDER — LIDOCAINE HCL 2 % IJ SOLN
20.0000 mL | Freq: Once | INTRAMUSCULAR | Status: AC
  Administered 2023-08-25: 400 mg

## 2023-08-25 MED ORDER — ROPIVACAINE HCL 2 MG/ML IJ SOLN
9.0000 mL | Freq: Once | INTRAMUSCULAR | Status: AC
  Administered 2023-08-25: 9 mL via PERINEURAL

## 2023-08-25 MED ORDER — ROPIVACAINE HCL 2 MG/ML IJ SOLN
INTRAMUSCULAR | Status: AC
  Filled 2023-08-25: qty 20

## 2023-08-25 MED ORDER — FENTANYL CITRATE (PF) 100 MCG/2ML IJ SOLN
25.0000 ug | INTRAMUSCULAR | Status: DC | PRN
  Administered 2023-08-25: 50 ug via INTRAVENOUS

## 2023-08-25 MED ORDER — CEFAZOLIN SODIUM 1 G IJ SOLR
INTRAMUSCULAR | Status: AC
  Filled 2023-08-25: qty 20

## 2023-08-25 MED ORDER — MIDAZOLAM HCL 5 MG/5ML IJ SOLN
0.5000 mg | Freq: Once | INTRAMUSCULAR | Status: AC
  Administered 2023-08-25: 1.5 mg via INTRAVENOUS

## 2023-08-25 NOTE — Progress Notes (Signed)
Safety precautions to be maintained throughout the outpatient stay will include: orient to surroundings, keep bed in low position, maintain call bell within reach at all times, provide assistance with transfer out of bed and ambulation.  

## 2023-08-25 NOTE — Progress Notes (Signed)
PROVIDER NOTE: Interpretation of information contained herein should be left to medically-trained personnel. Specific patient instructions are provided elsewhere under "Patient Instructions" section of medical record. This document was created in part using STT-dictation technology, any transcriptional errors that may result from this process are unintentional.  Patient: Brian [name] Type: Established DOB: 1961/02/03 MRN: [id number] PCP: [name], MD  Service: Procedure DOS: 08/25/2023 Setting: Ambulatory Location: Ambulatory outpatient facility Delivery: Face-to-face Provider: Edward Jolly, MD Specialty: Interventional Pain Management Specialty designation: 09 Location: Outpatient facility Ref. Prov.: Edward Jolly, MD       Interventional Therapy   Primary Reason for Admission: Surgical management of chronic pain condition.   Procedure:              Type: [employer] Trial Spinal Cord Neurostimulator Implant (Percutaneous, interlaminar, posterior epidural placement) Laterality: Bilateral (-50)  Level: Cervical  Imaging: Fluoroscopic guidance Anesthesia: Local anesthesia (1-2% Lidocaine) Sedation: Moderate Sedation                       DOS: 08/25/2023  Performed by: Edward Jolly, MD  Purpose: Diagnostic. To determine if a permanent implant may be effective in controlling some or all of Brian [name] chronic pain symptoms.  Rationale (medical necessity): procedure needed and proper for the diagnosis and/or treatment of Brian [name] medical symptoms and needs. 1. Complex regional pain syndrome type 1 of right upper extremity   2. Complex regional pain syndrome type 1 of left lower extremity   3. Radiation-induced brachial plexopathy (RIGHT)   4. 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 Chronic pain syndrome   6. Radiation-induced brachial plexopathy    NAS-11 Pain score:   Pre-procedure: 5 /10   Post-procedure: 0-No pain/10     Target: Posterior epidural  [name] over the dorsal columns of the spinal cord. Location: Posterior intraspinal canal Region: Cervical  Approach: Translaminar percutaneous  Type of procedure: Surgical   Position / Prep / Materials:  Position: Prone  Prep solution: ChloraPrep (2% chlorhexidine gluconate and 70% isopropyl alcohol) Prep Area: Entire  Posterior  Cervicothoracic and thoracolumbar   Materials:  Tray: Implant tray Needle(s):  Type: Epidural  Gauge (G):  14   Length: Regular (10cm)  Qty: 1  H&P (Pre-op Assessment):  Brian [name] is a 63 y.o. (year old), male patient, seen today for interventional treatment. He  has a past surgical history that includes Leg Surgery (age 23 ); IR IVC FILTER PLMT / S&I /IMG GUID/MOD SED (03/04/2020); Inguinal hernia repair (Right, 06/24/2020); IR Radiologist Eval & Mgmt (08/06/2020); and IR IVC FILTER RETRIEVAL / S&I /IMG GUID/MOD SED (08/15/2020).  Initial Vital Signs:  Pulse/EKG Rate: 82ECG Heart Rate: 81 Temp: 98.4 F (36.9 C) Resp: 16 BP: 110/79 SpO2: 100 %  BMI: Estimated body mass index is 25.85 kg/m as calculated from the following:   Height as of this encounter: 5\' 8"  (1.727 m).   Weight as of this encounter: 170 lb (77.1 kg).  Risk Assessment: Allergies: Reviewed. He has no known allergies.  Allergy Precautions: None required Coagulopathies: Reviewed. None identified.  Blood-thinner therapy: None at this time Active Infection(s): Reviewed. None identified. Brian [name] is afebrile  Site Confirmation: Brian [name] was asked to confirm the procedure and laterality before marking the site, which he did. Procedure checklist: Completed Consent: Before the procedure and under the influence of no sedative(s), amnesic(s), or anxiolytics, the patient was informed of the treatment options, risks and possible complications. To fulfill our ethical  and legal obligations, as recommended by the American Medical Association's Code of Ethics, I have informed the patient of my  clinical impression; the nature and purpose of the treatment or procedure; the risks, benefits, and possible complications of the intervention; the alternatives, including doing nothing; the risk(s) and benefit(s) of the alternative treatment(s) or procedure(s); and the risk(s) and benefit(s) of doing nothing.  Brian [name] was provided with information about the general risks and possible complications associated with most interventional procedures. These include, but are not limited to: failure to achieve desired goals, infection, bleeding, organ or nerve damage, allergic reactions, paralysis, and/or death.  In addition, he was informed of those risks and possible complications associated to this particular procedure, which include, but are not limited to: damage to the implant; failure to decrease pain; local, systemic, or serious CNS infections, intraspinal abscess with possible cord compression and paralysis, or life-threatening such as meningitis; intrathecal and/or epidural bleeding with formation of hematoma with possible spinal cord compression and permanent paralysis; organ damage; nerve injury or damage with subsequent sensory, motor, and/or autonomic system dysfunction, resulting in transient or permanent pain, numbness, and/or weakness of one or several areas of the body; allergic reactions, either minor or major life-threatening, such as anaphylactic or anaphylactoid reactions.  Furthermore, Brian [name] was informed of those risks and complications associated with the medications. These include, but are not limited to: allergic reactions (i.e.: anaphylactic or anaphylactoid reactions); arrhythmia;  Hypotension/hypertension; cardiovascular collapse; respiratory depression and/or shortness of breath; swelling or edema; medication-induced neural toxicity; particulate matter embolism and blood vessel occlusion with resultant organ, and/or nervous system infarction and permanent paralysis.  Finally,  he was informed that Medicine is not an exact science; therefore, there is also the possibility of unforeseen or unpredictable risks and/or possible complications that may result in a catastrophic outcome. The patient indicated having understood very clearly. We have given the patient no guarantees and we have made no promises. Enough time was given to the patient to ask questions, all of which were answered to the patient's satisfaction. Mr. Carosella has indicated that he wanted to continue with the procedure. Attestation: I, the ordering provider, attest that I have discussed with the patient the benefits, risks, side-effects, alternatives, likelihood of achieving goals, and potential problems during recovery for the procedure that I have provided informed consent. Date  Time: 08/25/2023  7:57 AM  Pre-Procedure Preparation:  Monitoring: As per clinic protocol. Respiration, ETCO2, SpO2, BP, heart rate and rhythm monitor placed and checked for adequate function Safety Precautions: Patient was assessed for positional comfort and pressure points before starting the procedure. Time-out: I initiated and conducted the "Time-out" before starting the procedure, as per protocol. The patient was asked to participate by confirming the accuracy of the "Time Out" information. Verification of the correct person, site, and procedure were performed and confirmed by me, the nursing staff, and the patient. "Time-out" conducted as per Joint Commission's Universal Protocol (UP.01.01.01). Time: 0848 Start Time: 0848 hrs.  Description/Narrative of Procedure:          Rationale (medical necessity): procedure needed and proper for the diagnosis and/or treatment of the patient's medical symptoms and needs. Procedural Technique Safety Precautions: Aspiration looking for blood return was conducted prior to all injections. At no point did we inject any substances, as a needle was being advanced. No attempts were made at seeking  any paresthesias. Safe injection practices and needle disposal techniques used. Medications properly checked for expiration dates. SDV (single dose vial) medications  used. Description of the Procedure: Protocol guidelines were followed. The patient was assisted into a comfortable position. The target area was identified and the area prepped in the usual manner. Skin & deeper tissues infiltrated with local anesthetic. Appropriate amount of time allowed to pass for local anesthetics to take effect. The procedure needles were then advanced to the target area. Proper needle placement secured. Negative aspiration confirmed. Solution injected in intermittent fashion, asking for systemic symptoms every 0.5cc of injectate. The needles were then removed and the area cleansed, making sure to leave some of the prepping solution back to take advantage of its long term bactericidal properties.  Technical description of procedure: Availability of a responsible, adult driver, and NPO status confirmed. Informed consent was obtained after having discussed risks and possible complications. An IV was started. The patient was then taken to the fluoroscopy suite, where the patient was placed in position for the procedure, over the fluoroscopy table. The patient was then monitored in the usual manner. Fluoroscopy was manipulated to obtain the best possible view of the target. Parallex error was corrected before commencing the procedure. Once a clear view of the target had been obtained, the skin and deeper tissues over the procedure site were infiltrated using lidocaine, loaded in a 10 cc luer-loc syringe with a 0.5 inch, 25-G needle. The introducer needle(s) was/were then inserted through the skin and deeper tissues. A paramidline approach was used to enter the posterior epidural [name] at a 30 angle, using "Loss-of-resistance Technique" with 3 ml of PF-NaCl (0.9% NSS). Correct needle placement was confirmed in the antero-posterior  and lateral fluoroscopic views. The lead was gently introduced and manipulated under real-time fluoroscopy, constantly assessing for pain, discomfort, or paresthesias, until the tip rested at the desired level. Both sides were done in identical fashion. Electrode placement was tested until appropriate coverage was attained. Once the patient confirmed that the stimulation was over the desired area, the lead(s) was/were secured in place and the introducer needles removed. This was done under real-time fluoroscopy while observing the electrode tip to avoid unintended migration. The area was covered with a non-occlusive dressing and the patient transported to recovery for further programming.  Vitals:   08/25/23 0949 08/25/23 0955 08/25/23 1005 08/25/23 1015  BP: (!) 193/99 (!) 170/96 (!) 153/102 124/85  Pulse:      Resp: (!) 21 12 14 14   Temp:  (!) 97.3 F (36.3 C)  (!) 97.3 F (36.3 C)  SpO2: 96% 96% 96% 97%  Weight:      Height:        Start Time: 0848 hrs. End Time: 0950 hrs.  Neurostimulator Details:   Lead(s):  Brand: [employer]         Epidural Access Level:  T11-12 T11-12  Lead implant:  Bilateral   No. of Electrodes/Lead:  16 16  Laterality:  Left Right  Top electrode location:  C3 C3  Model No.: [id number] E [id number]  Length: 70cm Same  Lot No.: [id number]   Imaging Guidance (Spinal):          Type of Imaging Technique: Fluoroscopy Guidance (Spinal) Indication(s): Fluoroscopy guidance for needle placement to enhance accuracy in procedures requiring precise needle localization for targeted delivery of medication in or near specific anatomical locations not easily accessible without such real-time imaging assistance. Exposure Time: Please see nurses notes. Contrast: None used. Fluoroscopic Guidance: I was personally present during the use of fluoroscopy. "Tunnel Vision Technique" used to obtain the best possible view of the  target area. Parallax error corrected before  commencing the procedure. "Direction-depth-direction" technique used to introduce the needle under continuous pulsed fluoroscopy. Once target was reached, antero-posterior, oblique, and lateral fluoroscopic projection used confirm needle placement in all planes. Images permanently stored in EMR. Interpretation: No contrast injected. I personally interpreted the imaging intraoperatively. Adequate needle placement confirmed in multiple planes. Permanent images saved into the patient's record.      Antibiotic Prophylaxis:   Anti-infectives (From admission, onward)    Start     Dose/Rate Route Frequency Ordered Stop   08/25/23 0841  ceFAZolin (ANCEF) IVPB 2g/100 mL premix        2 g 200 mL/hr over 30 Minutes Intravenous 30 min pre-op 08/25/23 0841 08/25/23 0920   08/25/23 0000  cephALEXin (KEFLEX) 500 MG capsule        500 mg Oral 4 times daily 08/25/23 0830 09/01/23 2359      Indication(s): Implant Prophylaxis.  Post-operative Assessment:  Post-procedure Vital Signs:  Pulse/HCG Rate: 8275 Temp: (!) 97.3 F (36.3 C) Resp: 14 BP: 124/85 SpO2: 97 %  Complications: No immediate post-treatment complications observed by team, or reported by patient.  Note: The patient tolerated the entire procedure well. A repeat set of vitals were taken after the procedure and the patient was kept under observation following institutional policy, for this type of procedure. Post-procedural neurological assessment was performed, showing return to baseline, prior to discharge. The patient was provided with post-procedure discharge instructions, including a section on how to identify potential problems. Should any problems arise concerning this procedure, the patient was given instructions to immediately contact us, at any time, without hesitation. In any case, we plan to contact the patient by telephone for a follow-up status report regarding this interventional procedure.  Comments:  No additional relevant  information.  Plan of Care  Orders:  Orders Placed This Encounter  Procedures   DG PAIN CLIN[facility] C-ARM 1-60 MIN NO REPORT    Intraoperative interpretation by procedural physician at [facility].    Standing Status:   Standing    Number of Occurrences:   1    Reason for exam::   Assistance in needle guidance and placement for procedures requiring needle placement in or near specific anatomical locations not easily accessible without such assistance.   Medications administered: We administered lidocaine, midazolam, fentaNYL, ropivacaine (PF) 2 mg/mL (0.2%), and ceFAZolin.  See the medical record for exact dosing, route, and time of administration.  Follow-up plan:   Return in about 1 week (around 09/01/2023) for SCS lead pull.       Recent Visits Date Type Provider Dept  07/20/23 Office Visit Edward Jolly, MD [facility]  06/28/23 Procedure visit Edward Jolly, MD [facility]  06/09/23 Office Visit Edward Jolly, MD [facility]  Showing recent visits within past 90 days and meeting all other requirements Today's Visits Date Type Provider Dept  08/25/23 Procedure visit Edward Jolly, MD [facility]  Showing today's visits and meeting all other requirements Future Appointments Date Type Provider Dept  09/01/23 Appointment Edward Jolly, MD [facility]  Showing future appointments within next 90 days and meeting all other requirements  Disposition: Discharge home  Discharge (Date  Time): 08/25/2023; 1035 hrs.   Primary Care Physician: [name], MD Location: [facility] Note by: Edward Jolly, MD (TTS technology used. I apologize for any typographical errors that were not detected and corrected.) Date: 08/25/2023; Time: 12:11 PM

## 2023-08-25 NOTE — Patient Instructions (Signed)
Today we did the following -We have done a Spinal Cord Stimulator Trial with [employer]  -As long as the leads are in place, do not bathe or shower. You may sponge bathe.  -While the lead is in place, please limit the bending, lifting, or twisting because the lead can move.  -The things we want to see is if your pain improves (and by what percentage), if you can do more activity (don't overdo it), and if you can use less of your "as needed" medicine. Do not stop long acting medicines like methadone, oxycontin, MS Contin, etc without checking with [place].  -It is VERY important that you pick up the antibiotics we prescribed, Keflex, on your way home from the trial and take them as prescribed(4 times a day), starting today, for as long as the lead is in place.  -The Spina Cord Stimulator Representative will be in contact with you while the lead is in place to make sure the trial goes as well as possible.  -Please contact us with any questions or concerns at any time during the trial.   -If you start running a fever over 100 degrees, have severe back pain, or new pain running down the legs, or drainage coming from the lead site, contact us immediately and/or go to the emergency room.  -Please do not restart any sort of medication that can thin your blood such as Aspirin, ibuprofen, motrin, aleve, plavix, coumadin, etc. If you aren't sure, call and ask.  -We will have you return next Wednesday to have the lead removed. If this is successful, at that point we can go over the details about the permanent implant.

## 2023-08-26 ENCOUNTER — Telehealth: Payer: Self-pay | Admitting: *Deleted

## 2023-08-26 ENCOUNTER — Ambulatory Visit: Payer: BC Managed Care – PPO | Admitting: Occupational Therapy

## 2023-08-26 NOTE — Telephone Encounter (Signed)
Attempted to call for post procedure follow-up. Message left. 

## 2023-08-27 ENCOUNTER — Ambulatory Visit (HOSPITAL_COMMUNITY)
Admission: RE | Admit: 2023-08-27 | Discharge: 2023-08-27 | Disposition: A | Discharge status: Home / Self Care | Payer: BC Managed Care – PPO | Source: Ambulatory Visit | Attending: Internal Medicine | Admitting: Internal Medicine

## 2023-08-27 ENCOUNTER — Ambulatory Visit (HOSPITAL_COMMUNITY)
Admission: RE | Admit: 2023-08-27 | Discharge: 2023-08-27 | Disposition: A | Discharge status: Home / Self Care | Payer: BC Managed Care – PPO | Source: Ambulatory Visit | Attending: Student

## 2023-08-27 DIAGNOSIS — J984 Other disorders of lung: Secondary | ICD-10-CM | POA: Diagnosis not present

## 2023-08-27 DIAGNOSIS — C3490 Malignant neoplasm of unspecified part of unspecified bronchus or lung: Secondary | ICD-10-CM | POA: Diagnosis not present

## 2023-08-27 DIAGNOSIS — R918 Other nonspecific abnormal finding of lung field: Secondary | ICD-10-CM | POA: Diagnosis not present

## 2023-08-27 DIAGNOSIS — J9811 Atelectasis: Secondary | ICD-10-CM | POA: Diagnosis not present

## 2023-08-27 DIAGNOSIS — J90 Pleural effusion, not elsewhere classified: Secondary | ICD-10-CM | POA: Insufficient documentation

## 2023-08-27 DIAGNOSIS — Z48813 Encounter for surgical aftercare following surgery on the respiratory system: Secondary | ICD-10-CM | POA: Diagnosis not present

## 2023-08-27 DIAGNOSIS — C3491 Malignant neoplasm of unspecified part of right bronchus or lung: Secondary | ICD-10-CM | POA: Diagnosis not present

## 2023-08-27 MED ORDER — LIDOCAINE HCL 1 % IJ SOLN
INTRAMUSCULAR | Status: AC
  Filled 2023-08-27: qty 20

## 2023-08-27 NOTE — Procedures (Signed)
PROCEDURE SUMMARY:  Successful US guided right thoracentesis. Yielded 650 mL of dark, yellow fluid. Pt tolerated procedure well. No immediate complications.  Specimen was not sent for labs. CXR ordered.  EBL < 5 mL  Hoyt Koch PA-C 08/27/2023 3:12 PM

## 2023-08-28 ENCOUNTER — Other Ambulatory Visit: Payer: Self-pay | Admitting: Student in an Organized Health Care Education/Training Program

## 2023-08-28 DIAGNOSIS — G894 Chronic pain syndrome: Secondary | ICD-10-CM

## 2023-08-28 DIAGNOSIS — G90511 Complex regional pain syndrome I of right upper limb: Secondary | ICD-10-CM

## 2023-08-28 DIAGNOSIS — M792 Neuralgia and neuritis, unspecified: Secondary | ICD-10-CM

## 2023-08-28 DIAGNOSIS — G540 Brachial plexus disorders: Secondary | ICD-10-CM

## 2023-08-30 ENCOUNTER — Other Ambulatory Visit: Payer: Self-pay | Admitting: *Deleted

## 2023-08-30 ENCOUNTER — Ambulatory Visit: Payer: BC Managed Care – PPO | Admitting: Occupational Therapy

## 2023-08-30 DIAGNOSIS — G90511 Complex regional pain syndrome I of right upper limb: Secondary | ICD-10-CM

## 2023-08-30 DIAGNOSIS — G894 Chronic pain syndrome: Secondary | ICD-10-CM

## 2023-08-30 DIAGNOSIS — M792 Neuralgia and neuritis, unspecified: Secondary | ICD-10-CM

## 2023-08-30 DIAGNOSIS — G540 Brachial plexus disorders: Secondary | ICD-10-CM

## 2023-08-30 MED ORDER — AMITRIPTYLINE HCL 25 MG PO TABS: 75.0000 mg | ORAL_TABLET | Freq: Every day | ORAL | 0 refills | Status: DC

## 2023-09-01 ENCOUNTER — Ambulatory Visit
Payer: BC Managed Care – PPO | Attending: Student in an Organized Health Care Education/Training Program | Admitting: Student in an Organized Health Care Education/Training Program

## 2023-09-01 ENCOUNTER — Encounter: Payer: Self-pay | Admitting: Student in an Organized Health Care Education/Training Program

## 2023-09-01 ENCOUNTER — Ambulatory Visit
Admission: RE | Admit: 2023-09-01 | Discharge: 2023-09-01 | Disposition: A | Discharge status: Home / Self Care | Payer: BC Managed Care – PPO | Source: Ambulatory Visit | Attending: Student in an Organized Health Care Education/Training Program | Admitting: Student in an Organized Health Care Education/Training Program

## 2023-09-01 VITALS — BP 97/72 | HR 94 | Temp 99.0°F | Resp 16 | Ht 68.0 in | Wt 170.0 lb

## 2023-09-01 DIAGNOSIS — G90511 Complex regional pain syndrome I of right upper limb: Secondary | ICD-10-CM

## 2023-09-01 DIAGNOSIS — G540 Brachial plexus disorders: Secondary | ICD-10-CM

## 2023-09-01 DIAGNOSIS — G90522 Complex regional pain syndrome I of left lower limb: Secondary | ICD-10-CM

## 2023-09-01 NOTE — Progress Notes (Signed)
PROVIDER NOTE: Information contained herein reflects review and annotations entered in association with encounter. Interpretation of such information and data should be left to medically-trained personnel. Information provided to patient can be located elsewhere in the medical record under "Patient Instructions". Document created using STT-dictation technology, any transcriptional errors that may result from process are unintentional.    Patient: Brian [name]  Service Category: E/M  Provider: Edward Jolly, MD  DOB: 01-04-1961  DOS: 09/01/2023  Referring Provider: [name], MD  MRN: [id number]  Specialty: Interventional Pain Management  PCP: [name], MD  Type: Established Patient  Setting: Ambulatory outpatient    Location: Office  Delivery: Face-to-face     HPI  [name], a 63 y.o. year old male, is here today because of his Complex regional pain syndrome type 1 of right upper extremity [G90.511]. Mr. Strege primary complain today is Neck Pain   Pain Assessment: Severity of Chronic pain is reported as a 1 /10. Location: Shoulder  /both shoulders and arms. Onset: More than a month ago. Quality: Dull. Timing: Intermittent. Modifying factor(s): posture, positioning. Vitals:  height is 5\' 8"  (1.727 m) and weight is 170 lb (77.1 kg). His temporal temperature is 99 F (37.2 C). His blood pressure is 97/72 and his pulse is 94. His respiration is 16 and oxygen saturation is 94%.  BMI: Estimated body mass index is 25.85 kg/m as calculated from the following:   Height as of this encounter: 5\' 8"  (1.727 m).   Weight as of this encounter: 170 lb (77.1 kg). Last encounter: 07/20/2023. Last procedure: 08/25/2023.  Reason for encounter: post-procedure evaluation and assessment.   Patient presents for removal of his [facility] Cervical SCS trial leads. He endorses 80-85% pain improvement in regards to his right (>left) arm pain.  There is also improvement in his  right arm swelling, color, allodynia, temperature.  Unfortunately, no improvement in motor function.  He is looking forward to proceeding with permanent implant.  I will refer patient to Dr. Katrinka Blazing for further discussion of permanent implant.    Meds: DULoxetine, Fluticasone-Umeclidin-Vilant, albuterol, alectinib, amLODipine, amitriptyline, atorvastatin, baclofen, cephALEXin, gabapentin, methylPREDNISolone, naproxen sodium, and venlafaxine  History Review  Allergy: Mr. Delpilar has no known allergies. Drug: Mr. Degrace  reports no history of drug use. Alcohol:  reports current alcohol use of about 5.0 standard drinks of alcohol per week. Tobacco:  reports that he has never smoked. He has never used smokeless tobacco. Social: Mr. Norrington  reports that he has never smoked. He has never used smokeless tobacco. He reports current alcohol use of about 5.0 standard drinks of alcohol per week. He reports that he does not use drugs. Medical:  has a past medical history of COPD (chronic obstructive pulmonary disease) (HCC), Diabetes mellitus without complication (HCC), Hypertension, nscl ca (dx'd 08/2018), Pneumonia, and Subarachnoid hemorrhage (HCC). Surgical: Mr. Harshfield  has a past surgical history that includes Leg Surgery (age 72 ); IR IVC FILTER PLMT / S&I /IMG GUID/MOD SED (03/04/2020); Inguinal hernia repair (Right, 06/24/2020); IR Radiologist Eval & Mgmt (08/06/2020); and IR IVC FILTER RETRIEVAL / S&I /IMG GUID/MOD SED (08/15/2020). Family: family history includes Diabetes in his brother and mother.  Laboratory Chemistry Profile   Renal Lab Results  Component Value Date   BUN 19 08/12/2023   CREATININE 0.98 08/12/2023   GFRAA >60 05/03/2020   GFRNONAA >60 08/12/2023    Hepatic Lab Results  Component Value Date   AST 20 08/12/2023   ALT  18 08/12/2023   ALBUMIN 4.0 08/12/2023   ALKPHOS 197 (H) 08/12/2023   LIPASE 25 10/06/2015    Electrolytes Lab Results  Component Value Date   NA 140  08/12/2023   K 4.0 08/12/2023   CL 104 08/12/2023   CALCIUM 9.0 08/12/2023   MG 2.3 05/20/2020    Bone No results found for: "VD25OH", "VD125OH2TOT", "ZO1096EA5", "WU9811BJ4", "25OHVITD1", "25OHVITD2", "25OHVITD3", "TESTOFREE", "TESTOSTERONE"  Inflammation (CRP: Acute Phase) (ESR: Chronic Phase) No results found for: "CRP", "ESRSEDRATE", "LATICACIDVEN"       Note: Above Lab results reviewed.  Recent Imaging Review  DG PAIN CLINIC C-ARM 1-60 MIN NO REPORT Fluoro was used, but no Radiologist interpretation will be provided.  Please refer to "NOTES" tab for provider progress note. Note: Reviewed        Physical Exam  General appearance: Well nourished, well developed, and well hydrated. In no apparent acute distress Mental status: Alert, oriented x 3 (person, place, & time)       Respiratory: No evidence of acute respiratory distress Eyes: PERLA Vitals: BP 97/72   Pulse 94   Temp 99 F (37.2 C) (Temporal)   Resp 16   Ht 5\' 8"  (1.727 m)   Wt 170 lb (77.1 kg)   SpO2 94%   BMI 25.85 kg/m  BMI: Estimated body mass index is 25.85 kg/m as calculated from the following:   Height as of this encounter: 5\' 8"  (1.727 m).   Weight as of this encounter: 170 lb (77.1 kg). Ideal: Ideal body weight: 68.4 kg (150 lb 12.7 oz) Adjusted ideal body weight: 71.9 kg (158 lb 7.6 oz)  SCS trial leads removed under live fluoroscopy, tips intact  Improvement in right arm color, allodynia, swelling  Assessment   Diagnosis Status  1. Complex regional pain syndrome type 1 of right upper extremity   2. Complex regional pain syndrome type 1 of left lower extremity   3. Radiation-induced brachial plexopathy (RIGHT)    Controlled Controlled Controlled   Updated Problems: No problems updated.  Plan of Care  Referral to Dr. Katrinka Blazing to discuss permanent implant.   Brian [name] has a current medication list which includes the following long-term medication(s): albuterol, amitriptyline,  amlodipine, duloxetine, and gabapentin.  Pharmacotherapy (Medications Ordered): No orders of the defined types were placed in this encounter.  Orders:  Orders Placed This Encounter  Procedures   DG PAIN CLINIC C-ARM 1-60 MIN NO REPORT    Intraoperative interpretation by procedural physician at [facility].    Standing Status:   Standing    Number of Occurrences:   1    Reason for exam::   Assistance in needle guidance and placement for procedures requiring needle placement in or near specific anatomical locations not easily accessible without such assistance.   Ambulatory referral to Neurosurgery    Referral Priority:   Routine    Referral Type:   Surgical    Referral Reason:   Specialty Services Required    Referred to Provider:   Lovenia Kim, MD    Requested Specialty:   Neurosurgery    Number of Visits Requested:   1   Follow-up plan:   Return if symptoms worsen or fail to improve.      Recent Visits Date Type Provider Dept  08/25/23 Procedure visit Edward Jolly, MD [facility]  07/20/23 Office Visit Edward Jolly, MD [facility]  06/28/23 Procedure visit Edward Jolly, MD [facility]  06/09/23 Office Visit  Edward Jolly, MD [facility]  Showing recent visits within past 90 days and meeting all other requirements Today's Visits Date Type Provider Dept  09/01/23 Procedure visit Edward Jolly, MD [facility]  Showing today's visits and meeting all other requirements Future Appointments No visits were found meeting these conditions. Showing future appointments within next 90 days and meeting all other requirements  I discussed the assessment and treatment plan with the patient. The patient was provided an opportunity to ask questions and all were answered. The patient agreed with the plan and demonstrated an understanding of the instructions.  Patient advised to call back or seek an in-person evaluation if  the symptoms or condition worsens.  Duration of encounter: .  Total time on encounter, as per AMA guidelines included both the face-to-face and non-face-to-face time personally spent by the physician and/or other qualified health care professional(s) on the day of the encounter (includes time in activities that require the physician or other qualified health care professional and does not include time in activities normally performed by clinical staff). Physician's time may include the following activities when performed: Preparing to see the patient (e.g., pre-charting review of records, searching for previously ordered imaging, lab work, and nerve conduction tests) Review of prior analgesic pharmacotherapies. Reviewing PMP Interpreting ordered tests (e.g., lab work, imaging, nerve conduction tests) Performing post-procedure evaluations, including interpretation of diagnostic procedures Obtaining and/or reviewing separately obtained history Performing a medically appropriate examination and/or evaluation Counseling and educating the patient/family/caregiver Ordering medications, tests, or procedures Referring and communicating with other health care professionals (when not separately reported) Documenting clinical information in the electronic or other health record Independently interpreting results (not separately reported) and communicating results to the patient/ family/caregiver Care coordination (not separately reported)  Note by: Edward Jolly, MD Date: 09/01/2023; Time: 10:24 AM

## 2023-09-01 NOTE — Patient Instructions (Addendum)
Pain Management Discharge Instructions  General Discharge Instructions :  If you need to reach your doctor call: Monday-Friday 8:00 am - 4:00 pm at [phone] or toll free [phone].  After clinic hours [phone] to have operator reach doctor.  Bring all of your medication bottles to all your appointments in the pain clinic.  To cancel or reschedule your appointment with Pain Management please remember to call 24 hours in advance to avoid a fee.  Refer to the educational materials which you have been given on: General Risks, I had my Procedure. Discharge Instructions, Post Sedation.  Post Procedure Instructions:  The drugs you were given will stay in your system until tomorrow, so for the next 24 hours you should not drive, make any legal decisions or drink any alcoholic beverages.  You may eat anything you prefer, but it is better to start with liquids then soups and crackers, and gradually work up to solid foods.  Please notify your doctor immediately if you have any unusual bleeding, trouble breathing or pain that is not related to your normal pain.  Depending on the type of procedure that was done, some parts of your body may feel week and/or numb.  This usually clears up by tonight or the next day.  Walk with the use of an assistive device or accompanied by an adult for the 24 hours.  You may use ice on the affected area for the first 24 hours.  Put ice in a Ziploc bag and cover with a towel and place against area 15 minutes on 15 minutes off.  You may switch to heat after 24 hours.

## 2023-09-01 NOTE — Progress Notes (Signed)
Safety precautions to be maintained throughout the outpatient stay will include: orient to surroundings, keep bed in low position, maintain call bell within reach at all times, provide assistance with transfer out of bed and ambulation.

## 2023-09-02 ENCOUNTER — Encounter: Payer: Self-pay | Admitting: Student in an Organized Health Care Education/Training Program

## 2023-09-02 ENCOUNTER — Telehealth: Payer: Self-pay | Admitting: Occupational Therapy

## 2023-09-02 ENCOUNTER — Encounter: Payer: Self-pay | Admitting: Neurosurgery

## 2023-09-02 ENCOUNTER — Telehealth: Payer: Self-pay

## 2023-09-02 ENCOUNTER — Ambulatory Visit: Payer: BC Managed Care – PPO | Admitting: Occupational Therapy

## 2023-09-02 ENCOUNTER — Telehealth: Payer: Self-pay | Admitting: Neurology

## 2023-09-02 NOTE — Telephone Encounter (Signed)
Post procedure follow up.  LM

## 2023-09-02 NOTE — Telephone Encounter (Signed)
Attempted to call patient about message sent by OT, Jene Every on 09/02/23:  I had reached out to you a while ago re: my concerns with Bethanie Dicker. He has declined even more since that time as you are probably aware. He has no functional use/movement RUE, and now his Lt hand has declined significantly to the point of very minimal movement, no grip strength.  He does still have LUE ROM at shoulder, elbow, and forearm. I am more concerned because he is now complaining of some difficulty swallowing. He also says he feels his LUE is declining daily. He can no longer open and close his car door and has to get someone else to fill his tank with gas. But he is still driving w/ adapted handle on steering wheel - which I am concerned about as well.  He recently had the temporary nerve stimulator placed which did help with pain, but not motor function. It was recently removed w/ pain returning per pt report. I think he is now awaiting call from neurosurgeon to place permanent nerve stimulator.   My biggest concerns are: difficulty swallowing, rapid decline in Lt hand, and continued driving.   I left a message asking for a call back. I will also send a MyChart message.  Jacquelyne Balint, MD [facility]

## 2023-09-02 NOTE — Telephone Encounter (Signed)
Called and spoke with patient re: today's missed O.T. appointment. Pt reports he just forgot about it today. Pt reminded of next weeks appointments.   Discussed mutual concerns with patient's decline and therapist got permission from patient to reach out to his neurologist w/ concerns. (Pt with recent temporary nerve stimulator w/ pain relief but no return of motor function. Pt recently had it removed and awaiting plans to have permanent stimulator placed) Reached out to neurologist today via inbasket message.

## 2023-09-02 NOTE — Telephone Encounter (Signed)
Patient scheduled.

## 2023-09-03 ENCOUNTER — Telehealth: Payer: Self-pay | Admitting: Neurology

## 2023-09-03 NOTE — Telephone Encounter (Signed)
Returned patient's call.   He is having difficulty swallowing solids over the last couple of weeks. It is improved with fluids and he is not choking on liquids. This sounds less neuromuscular in nature as a result. He has a hoarse voice as well.   He endorses some shortness of breath when bending over, but not when laying in bed.  I recommended he discuss with his PCP.  He continues to loss strength on the left upper extremity similar to how it progressed on the right upper extremity. He has atrophy and almost no movement in the hand. He is not having the same pain on the left as the right.  I discussed with patient and said I would reach out to Dr. Katrinka Blazing and Dr. Cherylann Ratel regarding his symptoms as well and get back to him. He does see Dr. Katrinka Blazing on Monday.  All questions were answered.  Jacquelyne Balint, MD [facility]

## 2023-09-06 ENCOUNTER — Ambulatory Visit: Payer: BC Managed Care – PPO | Admitting: Occupational Therapy

## 2023-09-06 ENCOUNTER — Ambulatory Visit (INDEPENDENT_AMBULATORY_CARE_PROVIDER_SITE_OTHER): Payer: BC Managed Care – PPO | Admitting: Neurosurgery

## 2023-09-06 VITALS — BP 124/78 | Ht 68.0 in | Wt 170.0 lb

## 2023-09-06 DIAGNOSIS — G540 Brachial plexus disorders: Secondary | ICD-10-CM

## 2023-09-06 NOTE — Patient Instructions (Signed)
Please see below for information in regards to your upcoming surgery:   Planned surgery: Cervical spinal cord stimulator placement [facility])   Surgery date: 10/05/23 at [facility] (Medical Mall: [address], [place], [place] [address]) - you will find out your arrival time the business day before your surgery.   Pre-op appointment at [facility] Pre-admit Testing: we will call you with a date/time for this. If you are scheduled for an in person appointment, Pre-admit Testing is located on the first floor of the [facility] building, [address], [address]. Please bring all prescriptions in the original prescription bottles to your appointment. During this appointment, they will advise you which medications you can take the morning of surgery, and which medications you will need to hold for surgery. Labs (such as blood work, EKG) may be done at your pre-op appointment. You are not required to fast for these labs. Should you need to change your pre-op appointment, please call Pre-admit testing at [phone].     Surgical clearance: we will send a clearance request to Dr Arbutus Ped. They may wish to see you in their office prior to signing the clearance form. If so, they may call you to schedule an appointment. You will also need to be cleared by the speech pathologist prior to surgery. A referral has been placed.   [name]: Per message from [facility], PA-C and Dr Arbutus Ped: "Dr.Mohamed recommended holding it the day before the procedure, day of, and the day after."   Common restrictions after surgery: No bending, lifting, or twisting ("BLT"). Avoid lifting objects heavier than 10 pounds for the first 6 weeks after surgery. Where possible, avoid household activities that involve lifting, bending, reaching, pushing, or pulling such as laundry, vacuuming, grocery shopping, and childcare. Try to arrange for help from friends and family  for these activities while you heal. Do not drive while taking prescription pain medication. Weeks 6 through 12 after surgery: avoid lifting more than 25 pounds.     How to contact us:  If you have any questions/concerns before or after surgery, you can reach [place] at [phone], or you can send a mychart message. We can be reached by phone or mychart 8am-4pm, Monday-Friday.  *Please note: Calls after 4pm are forwarded to a third party answering service. Mychart messages are not routinely monitored during evenings, weekends, and holidays. Please call our office to contact the answering service for urgent concerns during non-business hours.    If you have FMLA/disability paperwork, please drop it off or fax it to [phone], attention Patty.   Appointments/FMLA & disability paperwork: Joycelyn Rua, & Flonnie Hailstone Registered Nurse/Surgery scheduler: Royston Cowper Medical Assistants: Nash Mantis Physician Assistants: Joan Flores, PA-C, Manning Charity, PA-C & Drake Leach, PA-C Surgeons: Venetia Night, MD & Ernestine Mcmurray, MD

## 2023-09-06 NOTE — H&P (View-Only) (Signed)
 Referring Physician:  [name], MD [address] [place],  [place] [address]  Primary Physician:  [name], MD  09/06/2023 History of Present Illness: Brian [name] is here today with a chief complaint of progressive right upper extremity pain and wasting.  He has a history of right shoulder pain as well as his neck.  He feels like this is continuing to progress.  He is getting numbness and tingling that radiates all the way down to his elbow and some into his forearm.  He feels that his arm is worsening and continues to have worsening worsening function.  He feels like his symptoms may be started in November of last year but started to become more severe in February.  His pain is so significant it is interrupting his sleep.  He has fasciculations as well as muscle cramps in the right arm.  He does have some left-sided symptoms which sound mostly ulnar.  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  INTERVAL HISTORY: Brian [name] is here today for follow-up.  He continues to have significant pain in his bilateral upper extremities right worse than left.  He had a spinal cord stimulator trial which gave him a significant improvement approximately 80%.  He is very optimistic about moving forward with permanent spinal cord stimulator placement.  In regards to his other interval history he has had worsening function in his left upper extremity.  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  This also appears to be a peripheral process based off of his EMG, likely radiation-induced.  Another history is also had some change in his voice, this could be postviral or infectious in nature, however we will have him evaluated by speech and language pathology says he has had previous neck radiation and concerns for possible nerve based weakness.  Review of Systems:  A 10 point review  of systems is negative, except for the pertinent positives and negatives detailed in the HPI.  Past Medical History: Past Medical History:  Diagnosis Date   COPD (chronic obstructive pulmonary disease) (HCC)    Diabetes mellitus without complication (HCC)    Hypertension    nscl ca dx'd 08/2018   Pneumonia    Subarachnoid hemorrhage [facility])     Past Surgical History: Past Surgical History:  Procedure Laterality Date   INGUINAL HERNIA REPAIR Right 06/24/2020   Procedure: RIGHT OPEN INGUINAL HERNIA REPAIR WITH MESH;  Surgeon: Kinsinger, De Blanch, MD;  Location: [facility];  Service: General;  Laterality: Right;   IR IVC FILTER PLMT / S&I Lenise Arena GUID/MOD SED  03/04/2020   IR IVC FILTER RETRIEVAL / S&I Lenise Arena GUID/MOD SED  08/15/2020   IR RADIOLOGIST EVAL & MGMT  08/06/2020   LEG SURGERY  age 63    left leg, from fracture    Allergies: Allergies as of 09/06/2023   (No Known Allergies)    Medications:  Current Outpatient Medications:    albuterol (VENTOLIN HFA) 108 (90 Base) MCG/ACT inhaler, INHALE 2 PUFFS INTO THE LUNGS EVERY 4 (FOUR) HOURS AS NEEDED FOR WHEEZING OR SHORTNESS OF BREATH., Disp: 6.7 each, Rfl: 4   [name] 150 MG capsule, TAKE 4 CAPSULES BY MOUTH TWICE DAILY WITH FOOD. SWALLOW CAPSULES WHOLE. DO NOT OPEN OR CRUSH. STORE IN ORIGINAL CONTAINER, Disp: 240 capsule, Rfl: 2   amitriptyline (ELAVIL) 25 MG tablet, Take 3 tablets (75 mg total) by mouth  at bedtime., Disp: 90 tablet, Rfl: 0   amLODipine (NORVASC) 2.5 MG tablet, Take 2.5 mg by mouth daily., Disp: , Rfl:    atorvastatin (LIPITOR) 20 MG tablet, Take 20 mg by mouth daily., Disp: , Rfl:    baclofen (LIORESAL) 10 MG tablet, Take 1 tablet (10 mg total) by mouth daily., Disp: 30 each, Rfl: 11   DULoxetine (CYMBALTA) 60 MG capsule, Take 1 capsule (60 mg total) by mouth daily., Disp: 90 capsule, Rfl: 3   gabapentin (NEURONTIN) 600 MG tablet, Take 1 tablet (600 mg) midday and take 4 tablets (2400 mg) at bedtime (Patient taking  differently: Take 1,200 mg by mouth at bedtime. Take 1 tablet (600 mg) midday and take 4 tablets (2400 mg) at bedtime), Disp: 120 tablet, Rfl: 5   methylPREDNISolone (MEDROL DOSEPAK) 4 MG TBPK tablet, Use as instructed, Disp: 21 tablet, Rfl: 0   naproxen sodium (ALEVE) 220 MG tablet, Take 220 mg by mouth., Disp: , Rfl:    TRELEGY ELLIPTA 100-62.5-25 MCG/INH AEPB, Inhale 1 puff into the lungs daily., Disp: , Rfl:    venlafaxine (EFFEXOR) 37.5 MG tablet, Take 37.5 mg by mouth daily., Disp: , Rfl:   Social History: Social History   Tobacco Use   Smoking status: Never   Smokeless tobacco: Never  Vaping Use   Vaping status: Never Used  Substance Use Topics   Alcohol use: Yes    Alcohol/week: 5.0 standard drinks of alcohol    Types: 5 Standard drinks or equivalent per week    Comment: occasionally   Drug use: No    Family Medical History: Family History  Problem Relation Age of Onset   Diabetes Mother    Diabetes Brother    Colon cancer Neg Hx    Stomach cancer Neg Hx    Thyroid cancer Neg Hx     Physical Examination: Vitals:   09/06/23 1307  BP: 124/78    General: Patient is in no apparent distress. Attention to examination is appropriate.  Neck:   Supple.  Full range of motion.  Positive Tinel sign at the supraclavicular space  Respiratory: Patient is breathing without any difficulty.   NEUROLOGICAL:     Awake, alert, oriented to person, place, and time.  Speech is clear and fluent.   Cranial Nerves: Pupils equal round and reactive to light.  Facial tone is symmetric. Shoulder shrug is symmetric. Tongue protrusion is midline.   Motor Exam:  Motor exam focused specifically on the right upper extremity demonstrates significant progression of his right upper extremity weakness, at this point his right upper extremity is mostly flaccid.  It appears to be quite swollen likely from lack of muscular pump phenomenon  Left upper extremity now demonstrates significant/severe  lower trunk innervated muscular wasting and weakness.  He has very minimal lower trunk function at this time.  He continues to have good proximal strength.  Medical Decision Making  Imaging: Reviewed his previous MRI of his brachial plexus, no evidence of significant mass effect, on my personal review of his MRI which is not yet officially read was that he demonstrates a significant amount of T2 hyperintensity mostly in the lower and middle trunk.  No evidence of mass lesion.  Will follow-up on his final read for any signs of diffuse or perineural deposits.  Electrodiagnostics:  [facility]  [address] [place], [address]  [place], [place] [address] Tel: [phone] Fax: [phone] Test Date:  03/30/2023   Patient: Brian [name] DOB: Oct 06, 1960 Physician:  Jacquelyne Balint  Sex: Male Height: 5\' 8"  Ref Phys:    ID#: [id number]     Technician:      History: This is a 63 year old male with bilateral upper limb pain and weakness.   NCV & EMG Findings: Extensive electrodiagnostic evaluation of bilateral upper limbs shows: Bilateral median antebrachial cutaneous (MAC) and right lateral antebrachial cutaneous (LAC) sensory responses are absent. Bilateral median, bilateral ulnar, and bilateral radial sensory responses show reduced amplitudes (see table below). Left LAC is present. Bilateral median (APB) motor responses show reduced amplitudes (right 1.17, left 4.0 mV). Right ulnar (ADM) motor response shows reduced amplitude (4.0 mV). Left ulnar (ADM) motor response is within normal limits, but borderline normal (7.3 mV). Chronic motor axon loss changes WITH accompanying active denervation changes are seen in the bilateral flexor pollicis longus, right first dorsal interosseous, right extensor inidicis proprius, abductor pollicis brevis, and triceps muscles. Chronic motor axon loss changes WITHOUT active denervation changes are seen in right biceps, right deltoid, right infraspinatus, left  first dorsal interosseous, left extensor indicis proprius, and left abductor pollicis brevis muscles.  Myokymic discharges are seen in the right flexor pollicis longus, right biceps, and right infraspinatus muscles.   Impression: This is an abnormal study. 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 There are myokymic discharges in 3 muscles of the right upper limb, as may be seen in radiation plexopathy. 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 An overlapping left C8-T1 radiculopathy cannot be completely excluded, though normal cervical paraspinal evaluation makes #2 more likely.       ___________________________ Jacquelyne Balint  I reviewed his cervical MRI which demonstrated adequate spacing for a cervical spinal cord stimulator.  I have personally reviewed the images and electrodiagnostics and agree with the above interpretation.  Assessment and Plan: Mr. Inclan is a pleasant 63 y.o. male with significant bilateral pper extremity progressive weakness numbness and pain.  He has had a significant improvement with a spinal cord stimulator trial in regards to his pain control.  States that is approximately 70-80% improvement.  He has also had a psychotherapy evaluation which she has passed.  At this point he has had both a positive trial as well as his evaluation from psychotherapy is positive therefore he is a candidate for spinal cord stimulator.  Prior to placement of his cervical spinal cord stimulator will plan to have him evaluated by speech-language pathology as he has had a progressive change in his voice, would like to evaluate him for any swallowing or vocal cord issues that we know about prior to surgery.  We did discuss that a placement of a cervical spinal cord stimulator would be primarily for pain  control.  They asked whether or not this would have any improvement in his function, is unlikely to improve his function unless his pain is limiting him.    We have reached out to our neurology team, pain team as well as our therapy team to discuss her findings today.  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  Given these findings we will plan to have a spinal cord stimulator placement scheduled for him.  Thank you for involving me in the care of this patient.    Lovenia Kim MD/MSCR Neurosurgery - Peripheral Nerve Surgery

## 2023-09-06 NOTE — Progress Notes (Signed)
Referring Physician:  [name], MD [address] [place],  [place] [address]  Primary Physician:  [name], MD  09/06/2023 History of Present Illness: Brian [name] is here today with a chief complaint of progressive right upper extremity pain and wasting.  He has a history of right shoulder pain as well as his neck.  He feels like this is continuing to progress.  He is getting numbness and tingling that radiates all the way down to his elbow and some into his forearm.  He feels that his arm is worsening and continues to have worsening worsening function.  He feels like his symptoms may be started in November of last year but started to become more severe in February.  His pain is so significant it is interrupting his sleep.  He has fasciculations as well as muscle cramps in the right arm.  He does have some left-sided symptoms which sound mostly ulnar.  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  INTERVAL HISTORY: Brian [name] is here today for follow-up.  He continues to have significant pain in his bilateral upper extremities right worse than left.  He had a spinal cord stimulator trial which gave him a significant improvement approximately 80%.  He is very optimistic about moving forward with permanent spinal cord stimulator placement.  In regards to his other interval history he has had worsening function in his left upper extremity.  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  This also appears to be a peripheral process based off of his EMG, likely radiation-induced.  Another history is also had some change in his voice, this could be postviral or infectious in nature, however we will have him evaluated by speech and language pathology says he has had previous neck radiation and concerns for possible nerve based weakness.  Review of Systems:  A 10 point review  of systems is negative, except for the pertinent positives and negatives detailed in the HPI.  Past Medical History: Past Medical History:  Diagnosis Date   COPD (chronic obstructive pulmonary disease) (HCC)    Diabetes mellitus without complication (HCC)    Hypertension    nscl ca dx'd 08/2018   Pneumonia    Subarachnoid hemorrhage [facility])     Past Surgical History: Past Surgical History:  Procedure Laterality Date   INGUINAL HERNIA REPAIR Right 06/24/2020   Procedure: RIGHT OPEN INGUINAL HERNIA REPAIR WITH MESH;  Surgeon: Kinsinger, De Blanch, MD;  Location: [facility];  Service: General;  Laterality: Right;   IR IVC FILTER PLMT / S&I Lenise Arena GUID/MOD SED  03/04/2020   IR IVC FILTER RETRIEVAL / S&I Lenise Arena GUID/MOD SED  08/15/2020   IR RADIOLOGIST EVAL & MGMT  08/06/2020   LEG SURGERY  age 64    left leg, from fracture    Allergies: Allergies as of 09/06/2023   (No Known Allergies)    Medications:  Current Outpatient Medications:    albuterol (VENTOLIN HFA) 108 (90 Base) MCG/ACT inhaler, INHALE 2 PUFFS INTO THE LUNGS EVERY 4 (FOUR) HOURS AS NEEDED FOR WHEEZING OR SHORTNESS OF BREATH., Disp: 6.7 each, Rfl: 4   [name] 150 MG capsule, TAKE 4 CAPSULES BY MOUTH TWICE DAILY WITH FOOD. SWALLOW CAPSULES WHOLE. DO NOT OPEN OR CRUSH. STORE IN ORIGINAL CONTAINER, Disp: 240 capsule, Rfl: 2   amitriptyline (ELAVIL) 25 MG tablet, Take 3 tablets (75 mg total) by mouth  at bedtime., Disp: 90 tablet, Rfl: 0   amLODipine (NORVASC) 2.5 MG tablet, Take 2.5 mg by mouth daily., Disp: , Rfl:    atorvastatin (LIPITOR) 20 MG tablet, Take 20 mg by mouth daily., Disp: , Rfl:    baclofen (LIORESAL) 10 MG tablet, Take 1 tablet (10 mg total) by mouth daily., Disp: 30 each, Rfl: 11   DULoxetine (CYMBALTA) 60 MG capsule, Take 1 capsule (60 mg total) by mouth daily., Disp: 90 capsule, Rfl: 3   gabapentin (NEURONTIN) 600 MG tablet, Take 1 tablet (600 mg) midday and take 4 tablets (2400 mg) at bedtime (Patient taking  differently: Take 1,200 mg by mouth at bedtime. Take 1 tablet (600 mg) midday and take 4 tablets (2400 mg) at bedtime), Disp: 120 tablet, Rfl: 5   methylPREDNISolone (MEDROL DOSEPAK) 4 MG TBPK tablet, Use as instructed, Disp: 21 tablet, Rfl: 0   naproxen sodium (ALEVE) 220 MG tablet, Take 220 mg by mouth., Disp: , Rfl:    TRELEGY ELLIPTA 100-62.5-25 MCG/INH AEPB, Inhale 1 puff into the lungs daily., Disp: , Rfl:    venlafaxine (EFFEXOR) 37.5 MG tablet, Take 37.5 mg by mouth daily., Disp: , Rfl:   Social History: Social History   Tobacco Use   Smoking status: Never   Smokeless tobacco: Never  Vaping Use   Vaping status: Never Used  Substance Use Topics   Alcohol use: Yes    Alcohol/week: 5.0 standard drinks of alcohol    Types: 5 Standard drinks or equivalent per week    Comment: occasionally   Drug use: No    Family Medical History: Family History  Problem Relation Age of Onset   Diabetes Mother    Diabetes Brother    Colon cancer Neg Hx    Stomach cancer Neg Hx    Thyroid cancer Neg Hx     Physical Examination: Vitals:   09/06/23 1307  BP: 124/78    General: Patient is in no apparent distress. Attention to examination is appropriate.  Neck:   Supple.  Full range of motion.  Positive Tinel sign at the supraclavicular space  Respiratory: Patient is breathing without any difficulty.   NEUROLOGICAL:     Awake, alert, oriented to person, place, and time.  Speech is clear and fluent.   Cranial Nerves: Pupils equal round and reactive to light.  Facial tone is symmetric. Shoulder shrug is symmetric. Tongue protrusion is midline.   Motor Exam:  Motor exam focused specifically on the right upper extremity demonstrates significant progression of his right upper extremity weakness, at this point his right upper extremity is mostly flaccid.  It appears to be quite swollen likely from lack of muscular pump phenomenon  Left upper extremity now demonstrates significant/severe  lower trunk innervated muscular wasting and weakness.  He has very minimal lower trunk function at this time.  He continues to have good proximal strength.  Medical Decision Making  Imaging: Reviewed his previous MRI of his brachial plexus, no evidence of significant mass effect, on my personal review of his MRI which is not yet officially read was that he demonstrates a significant amount of T2 hyperintensity mostly in the lower and middle trunk.  No evidence of mass lesion.  Will follow-up on his final read for any signs of diffuse or perineural deposits.  Electrodiagnostics:  [facility]  [address] [place], [address]  [place], [place] [address] Tel: [phone] Fax: [phone] Test Date:  03/30/2023   Patient: Brian [name] DOB: Oct 06, 1960 Physician:  Jacquelyne Balint  Sex: Male Height: 5\' 8"  Ref Phys:    ID#: [id number]     Technician:      History: This is a 63 year old male with bilateral upper limb pain and weakness.   NCV & EMG Findings: Extensive electrodiagnostic evaluation of bilateral upper limbs shows: Bilateral median antebrachial cutaneous (MAC) and right lateral antebrachial cutaneous (LAC) sensory responses are absent. Bilateral median, bilateral ulnar, and bilateral radial sensory responses show reduced amplitudes (see table below). Left LAC is present. Bilateral median (APB) motor responses show reduced amplitudes (right 1.17, left 4.0 mV). Right ulnar (ADM) motor response shows reduced amplitude (4.0 mV). Left ulnar (ADM) motor response is within normal limits, but borderline normal (7.3 mV). Chronic motor axon loss changes WITH accompanying active denervation changes are seen in the bilateral flexor pollicis longus, right first dorsal interosseous, right extensor inidicis proprius, abductor pollicis brevis, and triceps muscles. Chronic motor axon loss changes WITHOUT active denervation changes are seen in right biceps, right deltoid, right infraspinatus, left  first dorsal interosseous, left extensor indicis proprius, and left abductor pollicis brevis muscles.  Myokymic discharges are seen in the right flexor pollicis longus, right biceps, and right infraspinatus muscles.   Impression: This is an abnormal study. 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 There are myokymic discharges in 3 muscles of the right upper limb, as may be seen in radiation plexopathy. 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 An overlapping left C8-T1 radiculopathy cannot be completely excluded, though normal cervical paraspinal evaluation makes #2 more likely.       ___________________________ Jacquelyne Balint  I reviewed his cervical MRI which demonstrated adequate spacing for a cervical spinal cord stimulator.  I have personally reviewed the images and electrodiagnostics and agree with the above interpretation.  Assessment and Plan: Brian [name] is a pleasant 63 y.o. male with significant bilateral pper extremity progressive weakness numbness and pain.  He has had a significant improvement with a spinal cord stimulator trial in regards to his pain control.  States that is approximately 70-80% improvement.  He has also had a psychotherapy evaluation which she has passed.  At this point he has had both a positive trial as well as his evaluation from psychotherapy is positive therefore he is a candidate for spinal cord stimulator.  Prior to placement of his cervical spinal cord stimulator will plan to have him evaluated by speech-language pathology as he has had a progressive change in his voice, would like to evaluate him for any swallowing or vocal cord issues that we know about prior to surgery.  We did discuss that a placement of a cervical spinal cord stimulator would be primarily for pain  control.  They asked whether or not this would have any improvement in his function, is unlikely to improve his function unless his pain is limiting him.    We have reached out to our neurology team, pain team as well as our therapy team to discuss her findings today.  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  Given these findings we will plan to have a spinal cord stimulator placement scheduled for him.  Thank you for involving me in the care of this patient.    Lovenia Kim MD/MSCR Neurosurgery - Peripheral Nerve Surgery

## 2023-09-07 ENCOUNTER — Telehealth: Payer: Self-pay

## 2023-09-07 ENCOUNTER — Encounter: Payer: Self-pay | Admitting: Physician Assistant

## 2023-09-07 ENCOUNTER — Other Ambulatory Visit: Payer: Self-pay

## 2023-09-07 ENCOUNTER — Telehealth: Payer: Self-pay | Admitting: Radiation Therapy

## 2023-09-07 NOTE — Telephone Encounter (Signed)
-----   Message from Cassandra L Heilingoetter sent at 09/07/2023  1:49 PM EST ----- Regarding: RE: Chemo Med for surgery Good afternoon Dr. Claudene,   Thanks for reaching out. I reviewed with my supervising physician and the oral chemotherapy pharmacist. In general, there is no need to hold his alectinib for surgery, however, just to be on the safe side, Dr.Mohamed recommended holding it the day before the procedure, day of, and the day after. I can send a message to the patient to relay this information/instructions to him. Thank you for taking care of Mr. Rawlinson.   Cassie ----- Message ----- From: Claudene Penne ORN, MD Sent: 09/06/2023   2:10 PM EST To: Calton CROME Heilingoetter, PA-C; # Subject: Chemo Med for surgery                          Hi!   We are seeing a mutual patient today in regards to a possible spinal cord stimulator.  This would involve a cervical incision approximately 5 cm long and a another 1 on his flank of similar length.  Is his chemo medication something that can be hold or should be held for surgery or implants?  Just wanted to check as I am not familiar with this medication in the setting of surgical intervention.  Thank so much, Penne

## 2023-09-07 NOTE — Telephone Encounter (Signed)
 I spoke with Brian [name] about moving his brain MRI to be completed before his cervical pain stimulator is placed. This procedure is scheduled on 3/4. The brain MRI appt has been changed from 3/11 to 3/3. He was thankful for the call and for us  taking care of this change for him.   Devere Perch R.T.(R)(T) Radiation Special Procedures Lead

## 2023-09-08 ENCOUNTER — Other Ambulatory Visit: Payer: Self-pay

## 2023-09-08 DIAGNOSIS — Z01818 Encounter for other preprocedural examination: Secondary | ICD-10-CM

## 2023-09-09 ENCOUNTER — Ambulatory Visit: Payer: BC Managed Care – PPO | Attending: Family Medicine | Admitting: Occupational Therapy

## 2023-09-09 ENCOUNTER — Encounter: Payer: Self-pay | Admitting: Occupational Therapy

## 2023-09-09 DIAGNOSIS — R29818 Other symptoms and signs involving the nervous system: Secondary | ICD-10-CM

## 2023-09-09 DIAGNOSIS — R29898 Other symptoms and signs involving the musculoskeletal system: Secondary | ICD-10-CM | POA: Diagnosis not present

## 2023-09-09 DIAGNOSIS — M79602 Pain in left arm: Secondary | ICD-10-CM

## 2023-09-09 DIAGNOSIS — M79601 Pain in right arm: Secondary | ICD-10-CM

## 2023-09-09 DIAGNOSIS — M6281 Muscle weakness (generalized): Secondary | ICD-10-CM | POA: Diagnosis not present

## 2023-09-09 DIAGNOSIS — R278 Other lack of coordination: Secondary | ICD-10-CM

## 2023-09-09 DIAGNOSIS — R600 Localized edema: Secondary | ICD-10-CM | POA: Diagnosis not present

## 2023-09-09 DIAGNOSIS — M25641 Stiffness of right hand, not elsewhere classified: Secondary | ICD-10-CM | POA: Diagnosis not present

## 2023-09-09 NOTE — Therapy (Signed)
 OUTPATIENT OCCUPATIONAL THERAPY NEURO TREATMENT  Patient Name: Brian [name] MRN: [id number] DOB:Feb 07, 1961, 63 y.o., male Today's Date: 09/09/2023  PCP: Lamar Simpers REFERRING PROVIDER: Venetia Potters  END OF SESSION:  OT End of Session - 09/09/23 0811     Visit Number 29    Number of Visits 35    Date for OT Re-Evaluation 09/15/23    Authorization Type BCBS  $1600 deductible has been met  20% coinsurance applies  $8500 oop, zero applied  VL combined with pt, ot, st: 60    Authorization Time Period Renewal completed on 07/15/23    OT Start Time 0805    OT Stop Time 0830    OT Time Calculation (min) 25 min    Activity Tolerance Patient limited by fatigue    Behavior During Therapy [facility] for tasks assessed/performed             Past Medical History:  Diagnosis Date   COPD (chronic obstructive pulmonary disease) (HCC)    Diabetes mellitus without complication (HCC)    Hypertension    nscl ca dx'd 08/2018   Pneumonia    Subarachnoid hemorrhage (HCC)    Past Surgical History:  Procedure Laterality Date   INGUINAL HERNIA REPAIR Right 06/24/2020   Procedure: RIGHT OPEN INGUINAL HERNIA REPAIR WITH MESH;  Surgeon: Kinsinger, Herlene Righter, MD;  Location: [facility];  Service: General;  Laterality: Right;   IR IVC FILTER PLMT / S&I PORTER GUID/MOD SED  03/04/2020   IR IVC FILTER RETRIEVAL / S&I PORTER GUID/MOD SED  08/15/2020   IR RADIOLOGIST EVAL & MGMT  08/06/2020   LEG SURGERY  age 89    left leg, from fracture   Patient Active Problem List   Diagnosis Date Noted   Complex regional pain syndrome type 1 of left lower extremity 07/20/2023   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 07/20/2023   Chronic pain syndrome 07/20/2023   Complex regional pain syndrome type 1 of right upper extremity 05/04/2023   Right arm pain 05/04/2023   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   Recurrent right pleural effusion 07/23/2021   Hypothyroidism 10/24/2020   Acute hypoxemic respiratory failure  (HCC) 05/19/2020   Acute respiratory failure with hypoxia (HCC) 05/18/2020   Essential hypertension 05/18/2020   Type 2 diabetes mellitus with hyperlipidemia (HCC) 05/18/2020   S/P thoracentesis 03/21/2020   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/05/2020   Calf swelling 02/21/2020   Rash 02/07/2020   Adenocarcinoma of right lung, stage 4 (HCC) 02/07/2020   Chronic cough 12/20/2019   Venous thrombosis 09/21/2019   Shortness of breath 09/07/2019   Headache 08/09/2019   Lobar pneumonia, unspecified organism (HCC) 03/07/2019   Centrilobular emphysema (HCC) 03/07/2019   Encounter for antineoplastic immunotherapy 11/10/2018   Adenocarcinoma of right lung, stage 3 (HCC) 08/25/2018   Goals of care, counseling/discussion 08/25/2018   Encounter for antineoplastic chemotherapy 08/25/2018   Mass of upper lobe of right lung 08/04/2018   Subarachnoid hemorrhage (HCC) 05/22/2015    ONSET DATE: 03/17/23 Referral  REFERRING DIAG: UE Pain and paresthesias  As of 06/28/23 visit with Dr. Marcelino:  Complex regional pain syndrome type 1 of right upper extremity    2. Radiation-induced brachial plexopathy (RIGHT)   3. 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 Right arm pain   5. Chronic pain syndrome    THERAPY DIAG:  Other lack of coordination  Stiffness of right hand, not elsewhere classified  Pain in left arm  Edema of hand  Muscle  weakness (generalized)  Pain in right arm  Other symptoms and signs involving the nervous system  Rationale for Evaluation and Treatment: Rehabilitation  SUBJECTIVE:   SUBJECTIVE STATEMENT:  Pt reports he has to leave early for a work meeting today 09/09/23.   Difficulty swallowing food now  and increased hoarseness of voice  Pt accompanied by: self  PERTINENT HISTORY: Lung cancer w/ radiation, [facility] 2016. Pt reports developing symptoms RUE this February and worsening symptoms in Rt hand more recently - MD thinks this is due to radiation approx 4  years ago  PRECAUTIONS: None  WEIGHT BEARING RESTRICTIONS: No  PAIN:  Are you having pain? Yes: NPRS scale: 3/10 - gets worse near end of day,  Pain location: entire RUE and Lt hand Pain description: sharp, stabbing, throbbing, aching Aggravating factors: idle - positional Relieving factors: heat, ice, gabapentin  Tingling in fingers is worse 3/10  FALLS: Has patient fallen in last 6 months? Yes. Number of falls 1  LIVING ENVIRONMENT: Lives with: lives with their family and lives with their spouse Lives in: House/apartment Stairs: Yes: External: 2 steps; none Has following equipment at home:  has built in shower seat  PLOF: Independent with basic ADLs  PATIENT GOALS: to be able to get back to some level of use of my right hand  BELOW OBJECTIVE INFO AT ORIGINAL EVALUATION DATE BUT HAS DECLINED SINCE EVALUATION:   HAND DOMINANCE: Right  ADLs: Overall ADLs: min assist and increased time Transfers/ambulation related to ADLs: Eating: min assist - wife assists with cutting.  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  Still driving - uses LUE only at this point Handwriting: unable  POSTURE COMMENTS:  forward head and posterior pelvic tilt  ACTIVITY TOLERANCE: Activity tolerance: reports more fatigue due to constant pain  UPPER EXTREMITY ROM:    Active ROM Right eval Left eval  Shoulder flexion 115 unable to maintain ext elbow 140  Shoulder abduction    Shoulder adduction    Shoulder extension    Shoulder internal rotation    Shoulder external rotation    Elbow flexion WFL   Elbow extension WFL   Wrist flexion    Wrist  extension 30   Wrist ulnar deviation 10   Wrist radial deviation 5   Wrist pronation    Wrist supination    (Blank rows = not tested)  UPPER EXTREMITY MMT:     MMT Right eval Left eval  Shoulder flexion NT 4-/5  Shoulder abduction    Shoulder adduction    Shoulder extension    Shoulder internal rotation    Shoulder external rotation    Middle trapezius    Lower trapezius    Elbow flexion    Elbow extension    Wrist flexion 3   Wrist extension 3   Wrist ulnar deviation    Wrist radial deviation    Wrist pronation    Wrist supination    (Blank rows = not tested) RIGHT HAND - 15% COMPOSITE FLEXION     - 40% COMPOSITE EXTENSION HAND FUNCTION: Grip strength: Right: UNABLE lbs; Left: 31.9 lbs  COORDINATION: Finger Nose Finger test: Mild undershooting Left - right lacks strength/ proximal control to complete  SENSATION: [facility]  to testing, but reports heightened sensation - hypersensitive  EDEMA: Atrophy RUE - UPPER ARM, FOREARM  MUSCLE TONE: RUE: Moderate and Hypotonic and LUE: Within functional limits  COGNITION: Overall cognitive status: Within functional limits for tasks assessed.  Reports some concentration deficits which he attributes to pain  VISION: Subjective report: no changes Baseline vision: Wears glasses all the time Visual history:  nothing significant  VISION ASSESSMENT: Not tested  Patient has difficulty with following activities due to following visual impairments: NA  PERCEPTION: WFL  PRAXIS: WFL   TODAY'S TREATMENT:                                                                                                                              DATE:  09/09/23   Pt with increased hoarseness in voice and continued decline in LUE. Pt has no elbow extension against gravity Lt triceps now (this is new decline since last visit). Minimal movement in hand, w/ increased swelling today Lt hand and more observable atrophy Lt hand  RUE w/ no movement, shoulder  subluxation at least 2 finger width - pt reports he has sling for RUE. Atrophy around shoulder girdle and scapula noted.   Therapist had to assist patient in getting in/out of car today as pt cannot open car door. Concerned he is still driving even with adapted knob on steering wheel.   Pt shown light BUE wt bearing exercises in standing (Modified for hands and safety) w/ LE's slightly back for A/P wt shifts and side to side wt shifts for: proprioception, shoulder approximation/stability, and pain management. Support needed at Rt elbow and hand and instructed pt to get his wife to assist with this.   Standing with back against wall: worked on posture, scapula retraction, and cervical retraction of head/neck to combat severely forward posture (rounded kypotic back and forward head).  Also instructed pt to perform gentle A/ROM LUE in sh flex, elbow flex, forearm pron/sup, and wrist flex/ext. *May need 5 reps, 2 sets  vs. 10 reps as pt is already starting to decline in LUE  Discussed further potential A/E needs and assistive technology (through [facility]. 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 Although, had frank discussion with patient that he should probably start paperwork for long term disability with MD given his continued decline.    PATIENT EDUCATION: Education details:see above Person educated: Patient Education method: Programmer, Multimedia, Demonstration, and Verbal cues Education comprehension: verbalized understanding and returned demonstration  HOME EXERCISE PROGRAM: 04/14/23: bed positioning, AA/ROM HEP for RUE 04/22/23: passive stretching HEP for Rt hand, edema management strategies 07/22/23: contrast bath info 07/29/2023: swelling reduction; coordination HEP; NMES applications   GOALS: (SEE NEW STG's and LTG's further below for renewal/recertification period) Goals reviewed with patient? Yes  SHORT TERM  GOALS: Target date: 04/30/23  Patient will complete HEP designed to improve Bilateral shoulder range of motion  Goal status:MET  2.  Patient  will complete HEP designed to improve R elbow flex/ext strength  Goal status: MET  3.  Patient will complete HEP designed to improve range of motion for composite flexion and extension in right hand/wrist  Goal status: MET  4.  Patient will demonstrate ability to pick up and retrieve lightweight object from table with RUE - e.g. cell phone  Goal status: NOT MET   5.  Patient will report improved awareness of positional changes/ support techniques to reduce UE pain.    Goal status: MET   6.  Patient will demonstrate 50% composite active flex/ext in right hand  Goal status: NOT MET - Will continue to work on some active movement as able   LONG TERM GOALS: Target date: 06/28/23  Patient will complete updated HEP designed on UE strengthening without increase in pain  Goal status: IN PROGRESS - Will need modifications due to decline in RUE and even LUE  2.  Patient will demonstrate 10 lb grip strength R hand  Goal status: NOT MET - no active movement Rt hand  3.  Patient will demonstrate low reach RUE to obtain and release a 2 lb object x 5 without dropping  Goal status: NOT MET - pt only has scapula movement and some limited movement in gravity elim AA/ROM   4.  Patient will be able to cut his own food with modified independence  Goal status: NOT MET  5.  Patient will be independent with aquatic exercise program addressing BUE active movement and strength  Goal status: IN PROGRESS  6.  Patient will dress himself with modified independence  Goal status: NOT MET (Pt only doing 20%)    NEW STG's (due 08/15/23)   1.  Pt will demo 90% or greater P/ROM Rt hand  Baseline: 75% - 80% Goal status: IN PROGRESS  2.  Continue to address edema management and pain management strategies in RUE and Rt hand Baseline:  Goal status: IN  PROGRESS  3.  Provide HEP for LUE strength and Lt grip strength  Baseline:  Goal status: IN PROGRESS  4.  Pt to verbalize understanding with compensatory strategies to increase independence with ADLS (particularly dressing, cutting food, opening containers/jars) including one handed techniques and A/E recommendations Baseline:  Goal status: IN PROGRESS  NEW LTG's (due 09/15/23)    1.  Pt to improve grip strength Lt hand by at least 5 lbs  Baseline: 31.9 at initial eval but had declined to 6.8 lbs at recertification Goal status: IN PROGRESS  2.  Improve coordination Lt hand as evidenced by reducing speed on 9 hole peg test by 10 sec or more Baseline:  64 sec on recert date 07/15/23 Goal status: IN PROGRESS  3.  Pt will dress w/ no more than min assist and will cut food mod I level w/ A/E Baseline:  Goal status: IN PROGRESS   4. Pt will be independent with aquatic exercise program addressing BUE active movement and strength    Goal status: IN PROGRESS    ASSESSMENT:  CLINICAL IMPRESSION: Pt continues to demo decline in Lt hand function and NOW LUE weakness with no active elbow ext against gravity. Pt with increased hoarseness in voice. Pt is awaiting speech therapy clearance for procedure to place permanent cervical nerve stimulator. Pt's tentative surgery date is 10/05/23.   PERFORMANCE DEFICITS: in functional skills including ADLs, IADLs, coordination, dexterity, proprioception, sensation, edema, tone, ROM, strength, pain, muscle spasms, Fine motor control, Gross motor control, endurance, decreased knowledge of use  of DME, and UE functional use  IMPAIRMENTS: are limiting patient from ADLs, IADLs, rest and sleep, work, and leisure.   CO-MORBIDITIES: has co-morbidities such as CRPS  that affects occupational performance. Patient will benefit from skilled OT to address above impairments and improve overall function.  REHAB POTENTIAL: Good   PLAN:  OT FREQUENCY: 2x/week  OT  DURATION: 8 ADDITIONAL weeks  PLANNED INTERVENTIONS: 97168 OT Re-evaluation, 97535 self care/ADL training, 02889 therapeutic exercise, 97530 therapeutic activity, 97112 neuromuscular re-education, 97140 manual therapy, 97113 aquatic therapy, 97035 ultrasound, 97018 paraffin, 02960 fluidotherapy, 97010 moist heat, 97010 cryotherapy, 97034 contrast bath, 97032 electrical stimulation (manual), 97014 electrical stimulation unattended, 97760 Splinting (initial encounter), passive range of motion, functional mobility training, coping strategies training, patient/family education, and DME and/or AE instructions  RECOMMENDED OTHER SERVICES: may have potential for counseling - discussed with patient - he feels he is ok now, but will keep it in mind  CONSULTED AND AGREED WITH PLAN OF CARE: Patient  PLAN FOR NEXT SESSION: Discussed plan going forward - pt agreed to finish last scheduled week of therapy next week (week of 09/13/23), then place on hold or d/c until permanent nerve stimulator placed. Pt has 1 more aquatic session and 1 more land clinic session.     Burnard JINNY Roads, OT 09/09/2023, 8:12 AM

## 2023-09-13 ENCOUNTER — Ambulatory Visit: Payer: BC Managed Care – PPO | Admitting: Occupational Therapy

## 2023-09-13 ENCOUNTER — Other Ambulatory Visit: Payer: Self-pay | Admitting: Physician Assistant

## 2023-09-13 DIAGNOSIS — C3491 Malignant neoplasm of unspecified part of right bronchus or lung: Secondary | ICD-10-CM

## 2023-09-14 ENCOUNTER — Ambulatory Visit: Payer: BC Managed Care – PPO | Attending: Neurosurgery | Admitting: Speech Pathology

## 2023-09-14 DIAGNOSIS — R1312 Dysphagia, oropharyngeal phase: Secondary | ICD-10-CM | POA: Insufficient documentation

## 2023-09-14 DIAGNOSIS — G540 Brachial plexus disorders: Secondary | ICD-10-CM | POA: Diagnosis not present

## 2023-09-14 DIAGNOSIS — R490 Dysphonia: Secondary | ICD-10-CM | POA: Insufficient documentation

## 2023-09-14 NOTE — Therapy (Signed)
**Note Brian-Identified via Obfuscation** OUTPATIENT SPEECH LANGUAGE PATHOLOGY  VOICE and DYSPHAGIA EVALUATION   Patient Name: Brian [name] MRN: [id number] DOB:Nov 27, 1960, 63 y.o., male Today's Date: 09/14/2023  PCP: Brian Barre, MD REFERRING PROVIDER: Ernestine Mcmurray, MD   End of Session - 09/14/23 1633     Visit Number 1    Number of Visits 17    Date for SLP Re-Evaluation 11/09/23    Authorization Type [employer]    Progress Note Due on Visit 10    SLP Start Time 1100    SLP Stop Time  1145    SLP Time Calculation (min) 45 min    Activity Tolerance Patient tolerated treatment well             Past Medical History:  Diagnosis Date   COPD (chronic obstructive pulmonary disease) (HCC)    Diabetes mellitus without complication (HCC)    Hypertension    nscl ca dx'd 08/2018   Pneumonia    Subarachnoid hemorrhage (HCC)    Past Surgical History:  Procedure Laterality Date   INGUINAL HERNIA REPAIR Right 06/24/2020   Procedure: RIGHT OPEN INGUINAL HERNIA REPAIR WITH MESH;  Surgeon: Brian [name], Brian Blanch, MD;  Location: [facility];  Service: General;  Laterality: Right;   IR IVC FILTER PLMT / S&I Brian [name] GUID/MOD SED  03/04/2020   IR IVC FILTER RETRIEVAL / S&I Brian [name] GUID/MOD SED  08/15/2020   IR RADIOLOGIST EVAL & MGMT  08/06/2020   LEG SURGERY  age 50    left leg, from fracture   Patient Active Problem List   Diagnosis Date Noted   Complex regional pain syndrome type 1 of left lower extremity 07/20/2023   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 07/20/2023   Chronic pain syndrome 07/20/2023   Complex regional pain syndrome type 1 of right upper extremity 05/04/2023   Right arm pain 05/04/2023   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   Recurrent right pleural effusion 07/23/2021   Hypothyroidism 10/24/2020   Acute hypoxemic respiratory failure (HCC) 05/19/2020   Acute respiratory failure with hypoxia (HCC) 05/18/2020   Essential hypertension 05/18/2020   Type 2 diabetes mellitus with hyperlipidemia  (HCC) 05/18/2020   S/P thoracentesis 03/21/2020   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/05/2020   Calf swelling 02/21/2020   Rash 02/07/2020   Adenocarcinoma of right lung, stage 4 (HCC) 02/07/2020   Chronic cough 12/20/2019   Venous thrombosis 09/21/2019   Shortness of breath 09/07/2019   Headache 08/09/2019   Lobar pneumonia, unspecified organism (HCC) 03/07/2019   Centrilobular emphysema (HCC) 03/07/2019   Encounter for antineoplastic immunotherapy 11/10/2018   Adenocarcinoma of right lung, stage 3 (HCC) 08/25/2018   Goals of care, counseling/discussion 08/25/2018   Encounter for antineoplastic chemotherapy 08/25/2018   Mass of upper lobe of right lung 08/04/2018   Subarachnoid hemorrhage (HCC) 05/22/2015    ONSET DATE:  pt and his wife report gradual onset that began ~ 3-4 weeks ago; 09/06/2023  REFERRING DIAG:  G54.0 (ICD-10-CM) - Brachial plexopathy  G54.0 (ICD-10-CM) - Radiation-induced brachial plexopathy    THERAPY DIAG:  Dysphonia  Dysphagia, oropharyngeal phase  Rationale for Evaluation and Treatment Rehabilitation  SUBJECTIVE:   SUBJECTIVE STATEMENT: Pt pleasant, good historian, accompanied by his wife Pt accompanied by: significant other  PERTINENT HISTORY and DIAGNOSTIC FINDINGS: Brian [name] is a 63 y.o. male with past medical history significant for diabetes mellitus, hypertension, subarachnoid hemorrhage, dyslipidemia as well as leg fracture when he was 63 years old. The patient has history for smoking for around  12 years but quit in 1995. Pt was diagnosed with a stage IIIB (T1c, N3, M0) non-small cell lung cancer, adenocarcinoma presented with right upper lobe lung mass in addition to mediastinal and bilateral supraclavicular lymphadenopathy diagnosed in January 2020. Pt received 1) Concurrent chemoradiation with weekly carboplatin for AUC of 2 and paclitaxel 45 mg/M2. Status post 6 cycles. Last dose was given on October 10, 2018 with stable  disease.2) Radiation treatment to the enlarging right cervical lymph nodes under the care of Dr. Roselind [name]. First treatment 03/06/2019. Last treatment scheduled on 04/13/2019 3) Consolidation treatment with immunotherapy with Imfinzi 10 mg/KG every 2 weeks. First dose November 17, 2018. Status post 26 cycles. Imaging showed recurrent disease in the left cervical lymph nodes, and biopsy on 02/16/2020 confirmed the diagnosis. He has been getting ready to proceed with systemic chemotherapy, and an MRI on 02/21/20 for restaging revealed a 5 mm right lateral temporal lobe metastasis. A 3T MRI scan of the brain with and without contrast on 03/01/2020 showed the 5 mm metastasis in the right lateral temporal lobe, a 2 mm metastasis in the left posterior frontal cortex, and a 3 mm metastasis along the undersurface of the right thalamus. Small vessel ischemic changes were also noted within the hemispheric white matter and were stable. He's seen today to discuss options of stereotactic radiosurgery Brian [name]) to these three sites.  EMG on 03/30/23 did show evidence of bilateral brachial plexopathy with myokymia as would be seen in radiation plexopathy.   NCV & EMG Findings:  Impression: This is an abnormal study. The findings are most consistent with the following: 1. Evidence of a diffuse right brachial plexopathy, though most prominent in the lower truck, severe in degree electrically. There are myokymic discharges in 3 muscles of the right upper limb, as may be seen in radiation plexopathy. 2. 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 3. An overlapping left C8-T1 radiculopathy cannot be completely excluded, though normal cervical paraspinal evaluation makes #2 more likely  Pt currently maintaining on [name] 600 mg p.o. twice daily.  PAIN:  Are you having pain?  Chronic, managed throughout session   FALLS: Has patient fallen in last 6 months? No,   LIVING  ENVIRONMENT: Lives with: lives with their spouse Lives in: House/apartment  PLOF: Needs assistance with ADLs d/t RUE impairment  PATIENT GOALS    Voice evaluation to be cleared for upcoming surgery  OBJECTIVE:  COGNITION: Overall cognitive status: Within functional limits for tasks assessed  SOCIAL HISTORY: Water intake: optimal Caffeine/alcohol intake: minimal Daily voice use: minimal Environmental risks: None reported Occupational risks: None identified Misuse: Speaks without adequate breath support and Speaks on residual capacity Phonotraumatic behaviors: Other:none identified  PERCEPTUAL VOICE ASSESSMENT: Voice quality: hoarse, breathy, harsh, rough, strained, low vocal intensity, and vocal fatigue Vocal abuse: abnormal breathing pattern and habitual abnormal pitch Resonance: normal Respiratory function: clavicular breathing   ORAL MOTOR EXAMINATION Facial : WFL Lingual: WFL Velum: WFL Mandible: WFL Cough: Non-Productive; weak Voice: Hoarse, Strained, Breathy, Weak   PATIENT REPORTED OUTCOME MEASURES (PROM): To be completed in future sessions   TODAY'S TREATMENT:  Skilled information provided on vocal cord anatomy, function, ENT evaluation, impact of radiation on vocal cord structure and function as well as swallowing    PATIENT EDUCATION: Education details: results of this assessment, ENT referral, potential for future ST services to target dysphonia as well as voice/swallow health s/p radiation Person educated: Patient and Spouse Education method: Explanation Education comprehension:  verbalized understanding    ASSESSMENT:  CLINICAL IMPRESSION: Pt is a 63 year old male with surgery scheduled for cervical spinal cord stimulator placement on 10/05/2023 at [facility] d/t worsening pain in his bilateral upper extremities. Pt referred by neurology for voice evaluation and clearance prior to surgery. Per chart, pt has had previous neck radiation with concerns for  possible nerve based weakness see the above H&P.  Pt and his wife describe a 3-4 week sudden onset of hoarseness that is moderate in severity c/b harsh, breathy, strained, raspy vocal quality along with reduced respiratory support. The hoarseness has been stable, no swallowing issues identified during today's evaluation as pt was able to consume thin liquids via straw consecutively without overt s/s of aspiration.   At this time recommend ENT referral for evaluation. Education provided to pt and his wife on rationale for ENT referral. Secure chat sent to ENT as well as neurosurgery of recommendation and pt information.   Additional information provided to pt on potential to have ST services following evaluation and/or surgery if needed.   OBJECTIVE IMPAIRMENTS include voice disorder. These impairments are limiting patient from effectively communicating at home and in community. Factors affecting potential to achieve goals and functional outcome are co-morbidities and severity of impairments. Patient will benefit from skilled SLP services to address above impairments and improve overall function.  REHAB POTENTIAL: Good  PLAN: Will hold on therapy services until ENT evaluation and potential spine surgery.      Maddox Bratcher B. Dreama Saa, M.S., CCC-SLP, Tree surgeon Certified Brain Injury Specialist [facility]  [facility] Rehabilitation Services Office [phone] Ascom [phone] Fax [phone]

## 2023-09-15 ENCOUNTER — Encounter: Payer: Self-pay | Admitting: Student in an Organized Health Care Education/Training Program

## 2023-09-16 ENCOUNTER — Ambulatory Visit: Payer: BC Managed Care – PPO | Admitting: Occupational Therapy

## 2023-09-16 ENCOUNTER — Telehealth: Payer: Self-pay | Admitting: Neurology

## 2023-09-16 ENCOUNTER — Encounter: Payer: Self-pay | Admitting: Occupational Therapy

## 2023-09-16 DIAGNOSIS — M6281 Muscle weakness (generalized): Secondary | ICD-10-CM

## 2023-09-16 DIAGNOSIS — M79602 Pain in left arm: Secondary | ICD-10-CM

## 2023-09-16 DIAGNOSIS — R29818 Other symptoms and signs involving the nervous system: Secondary | ICD-10-CM | POA: Diagnosis not present

## 2023-09-16 DIAGNOSIS — R29898 Other symptoms and signs involving the musculoskeletal system: Secondary | ICD-10-CM

## 2023-09-16 DIAGNOSIS — R278 Other lack of coordination: Secondary | ICD-10-CM | POA: Diagnosis not present

## 2023-09-16 DIAGNOSIS — M79601 Pain in right arm: Secondary | ICD-10-CM | POA: Diagnosis not present

## 2023-09-16 DIAGNOSIS — R600 Localized edema: Secondary | ICD-10-CM

## 2023-09-16 DIAGNOSIS — M25641 Stiffness of right hand, not elsewhere classified: Secondary | ICD-10-CM

## 2023-09-16 DIAGNOSIS — Z0279 Encounter for issue of other medical certificate: Secondary | ICD-10-CM

## 2023-09-16 NOTE — Therapy (Signed)
OUTPATIENT OCCUPATIONAL THERAPY NEURO TREATMENT/DISCHARGE   Patient Name: Brian [name] MRN: [id number] DOB:August 11, 1960, 63 y.o., male Today's Date: 09/16/2023  PCP: Keturah Barre REFERRING PROVIDER: Jacquelyne Balint   OCCUPATIONAL THERAPY DISCHARGE SUMMARY  Visits from Start of Care: 30  Current functional level related to goals / functional outcomes: See below revised goals - pt has had significant decline therefore only met some goals   Remaining deficits: RUE ROM - no function RUE pain and Lt hand pain RT hand edema Lt hand function  Continued neuromuscular decline in UE's   Education / Equipment: HEP's, A/E recommendations, edema management strategies, community resources ([facility])   Patient agrees to discharge. Patient goals were partially met. Patient is being discharged due to lack of progress and decline. Pt scheduled for cervical nerve stimulator on 10/05/23 - recommend pt return sometime in April after nerve stimulator placed and any precautions lifted, pain decreased to progress therapy.    END OF SESSION:  OT End of Session - 09/16/23 0935     Visit Number 30    Number of Visits 35    Date for OT Re-Evaluation 09/15/23    Authorization Type BCBS  $1600 deductible has been met  20% coinsurance applies  $8500 oop, zero applied  VL combined with pt, ot, st: 60    Authorization Time Period Renewal completed on 07/15/23    OT Start Time 0930    OT Stop Time 1015    OT Time Calculation (min) 45 min    Activity Tolerance Patient limited by fatigue    Behavior During Therapy WFL for tasks assessed/performed             Past Medical History:  Diagnosis Date   COPD (chronic obstructive pulmonary disease) (HCC)    Diabetes mellitus without complication (HCC)    Hypertension    nscl ca dx'd 08/2018   Pneumonia    Subarachnoid hemorrhage (HCC)    Past Surgical History:  Procedure Laterality Date   INGUINAL HERNIA REPAIR Right 06/24/2020   Procedure: RIGHT OPEN  INGUINAL HERNIA REPAIR WITH MESH;  Surgeon: Kinsinger, De Blanch, MD;  Location: [facility];  Service: General;  Laterality: Right;   IR IVC FILTER PLMT / S&I Lenise Arena GUID/MOD SED  03/04/2020   IR IVC FILTER RETRIEVAL / S&I Lenise Arena GUID/MOD SED  08/15/2020   IR RADIOLOGIST EVAL & MGMT  08/06/2020   LEG SURGERY  age 60    left leg, from fracture   Patient Active Problem List   Diagnosis Date Noted   Complex regional pain syndrome type 1 of left lower extremity 07/20/2023   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 07/20/2023   Chronic pain syndrome 07/20/2023   Complex regional pain syndrome type 1 of right upper extremity 05/04/2023   Right arm pain 05/04/2023   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   Recurrent right pleural effusion 07/23/2021   Hypothyroidism 10/24/2020   Acute hypoxemic respiratory failure (HCC) 05/19/2020   Acute respiratory failure with hypoxia (HCC) 05/18/2020   Essential hypertension 05/18/2020   Type 2 diabetes mellitus with hyperlipidemia (HCC) 05/18/2020   S/P thoracentesis 03/21/2020   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/05/2020   Calf swelling 02/21/2020   Rash 02/07/2020   Adenocarcinoma of right lung, stage 4 (HCC) 02/07/2020   Chronic cough 12/20/2019   Venous thrombosis 09/21/2019   Shortness of breath 09/07/2019   Headache 08/09/2019   Lobar pneumonia, unspecified organism (HCC) 03/07/2019   Centrilobular emphysema (HCC) 03/07/2019   Encounter for antineoplastic  immunotherapy 11/10/2018   Adenocarcinoma of right lung, stage 3 (HCC) 08/25/2018   Goals of care, counseling/discussion 08/25/2018   Encounter for antineoplastic chemotherapy 08/25/2018   Mass of upper lobe of right lung 08/04/2018   Subarachnoid hemorrhage (HCC) 05/22/2015    ONSET DATE: 03/17/23 Referral  REFERRING DIAG: UE Pain and paresthesias  As of 06/28/23 visit with Dr. Cherylann Ratel:  Complex regional pain syndrome type 1 of right upper extremity    2. Radiation-induced  brachial plexopathy (RIGHT)   3. 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 Right arm pain   5. Chronic pain syndrome    THERAPY DIAG:  Other lack of coordination  Stiffness of right hand, not elsewhere classified  Pain in left arm  Edema of hand  Muscle weakness (generalized)  Pain in right arm  Other symptoms and signs involving the nervous system  Other symptoms and signs involving the musculoskeletal system  Rationale for Evaluation and Treatment: Rehabilitation  SUBJECTIVE:   SUBJECTIVE STATEMENT:  Pt has new pain at Rt hip and lower back (2/10) from fall on Sunday.  Pt saw SLP on 09/14/23 and recommends ENT.   Pt accompanied by: self  PERTINENT HISTORY: Lung cancer w/ radiation, [facility] 2016. Pt reports developing symptoms RUE this February and worsening symptoms in Rt hand more recently - MD thinks this is due to radiation approx 4 years ago  PRECAUTIONS: None  WEIGHT BEARING RESTRICTIONS: No  PAIN:  Are you having pain? Yes: NPRS scale: 3/10 - gets worse near end of day,  Pain location: entire RUE and Lt hand Pain description: sharp, stabbing, throbbing, aching Aggravating factors: idle - positional Relieving factors: heat, ice, gabapentin Tingling in fingers is worse 3/10  FALLS: Has patient fallen in last 6 months? Yes. Number of falls 1  LIVING ENVIRONMENT: Lives with: lives with their family and lives with their spouse Lives in: House/apartment Stairs: Yes: External: 2 steps; none Has following equipment at home:  has built in shower seat  PLOF: Independent with basic ADLs  PATIENT GOALS: to be able to get back to some level of use of my right hand  BELOW OBJECTIVE INFO AT ORIGINAL EVALUATION DATE BUT HAS DECLINED SINCE EVALUATION:   HAND DOMINANCE: Right  ADLs: Overall ADLs: min assist and increased time Transfers/ambulation related to ADLs: Eating: min assist - wife assists with cutting.  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  Still driving - uses LUE only at this point Handwriting: unable  POSTURE COMMENTS:  forward head and posterior pelvic tilt  ACTIVITY TOLERANCE: Activity tolerance: reports more fatigue due to constant pain  UPPER EXTREMITY ROM:    Active ROM Right eval Left eval  Shoulder flexion 115 unable to maintain ext elbow 140  Shoulder abduction    Shoulder adduction    Shoulder extension    Shoulder internal rotation    Shoulder external rotation    Elbow flexion WFL   Elbow extension WFL   Wrist flexion    Wrist extension 30   Wrist ulnar deviation 10   Wrist radial deviation 5   Wrist pronation    Wrist supination    (Blank rows = not tested)  UPPER EXTREMITY MMT:  MMT Right eval Left eval  Shoulder flexion NT 4-/5  Shoulder abduction    Shoulder adduction    Shoulder extension    Shoulder internal rotation    Shoulder external rotation    Middle trapezius    Lower trapezius    Elbow flexion    Elbow extension    Wrist flexion 3   Wrist extension 3   Wrist ulnar deviation    Wrist radial deviation    Wrist pronation    Wrist supination    (Blank rows = not tested) RIGHT HAND - 15% COMPOSITE FLEXION     - 40% COMPOSITE EXTENSION HAND FUNCTION: Grip strength: Right: UNABLE lbs; Left: 31.9 lbs  COORDINATION: Finger Nose Finger test: Mild undershooting Left - right lacks strength/ proximal control to complete  SENSATION: WFL to testing, but reports heightened sensation - hypersensitive  EDEMA: Atrophy RUE - UPPER ARM, FOREARM  MUSCLE TONE: RUE: Moderate and Hypotonic and LUE: Within functional  limits  COGNITION: Overall cognitive status: Within functional limits for tasks assessed.  Reports some concentration deficits which he attributes to pain  VISION: Subjective report: no changes Baseline vision: Wears glasses all the time Visual history:  nothing significant  VISION ASSESSMENT: Not tested  Patient has difficulty with following activities due to following visual impairments: NA  PERCEPTION: WFL  PRAXIS: WFL   TODAY'S TREATMENT:                                                                                                                              DATE:  09/16/23  Pt had recent fall this past Sunday reporting Rt ankle gave out on him. Rt hip flexion weak d/t pain from fall per pt report  Assessed revised STG's/LTG's - see below  Reviewed edema management strategies for Rt hand (elevation, retrograde massage, and coban wrapping). Also recommended wearing sling for RUE if walking in community  Reviewed A/E pt still has success with - pt using foam on eating utensil, and has universal cuff for toothbrush. Pt can still feed self w/ LUE using foam, but cannot cut food.  Pt issued picture of bath mitt to increase independence with bathing using LUE.   Pt issued formal updated HEP for neck/trunk and LUE - see pt instructions for details.  Pt also shown AA/ROM ex in gravity elim plane for LUE elbow ext at table - forgot to include in HEP however pt return demo and verbalized understanding of including in HEP. (Pt unable to perform elbow extension against gravity)  Pt also instructed to continue any AA/ROM ex's at table that pt can still do with RUE.    PATIENT EDUCATION: Education details:see above, updated LUE HEP  Person educated: Patient Education method: Explanation, Demonstration, and Verbal cues Education comprehension: verbalized understanding and returned demonstration  HOME EXERCISE PROGRAM: 04/14/23: bed positioning, AA/ROM HEP for RUE 04/22/23: passive  stretching HEP for Rt hand, edema management strategies 07/22/23: contrast bath info 07/29/2023:  swelling reduction; coordination HEP; NMES applications    Goals reviewed with patient? Yes  NEW STG's (due 08/15/23)   1.  Pt will demo 90% or greater P/ROM Rt hand  Baseline: 75% - 80% Goal status: NOT MET (40-50% - limited d/t significant edema)   2.  Continue to address edema management and pain management strategies in RUE and Rt hand Baseline:  Goal status: MET  3.  Provide HEP for LUE strength and Lt grip strength  Baseline:  Goal status: PARTIALLY MET - Pt had been issued inflated latex glove and small squishy ball for grip and pinch strength but can no longer do. Pt issued updated HEP for LUE on 09/16/23  4.  Pt to verbalize understanding with compensatory strategies to increase independence with ADLS (particularly dressing, cutting food, opening containers/jars) including one handed techniques and A/E recommendations Baseline:  Goal status: PARTIALLY MET (issued A/E recommendations and discussed task modifications/strategies, however has had such decline in Lt hand, unable to use most - pt can still feed himself and brush teeth w/ A/E)  NEW LTG's   1.  Pt to improve grip strength Lt hand by at least 5 lbs  Baseline: 31.9 at initial eval but had declined to 6.8 lbs at recertification Goal status: NOT MET (Pt has no grip strength at this time)   2.  Improve coordination Lt hand as evidenced by reducing speed on 9 hole peg test by 10 sec or more Baseline:  64 sec on recert date 07/15/23 Goal status: NOT MET (unable to pick up pegs now Lt hand)   3.  Pt will dress w/ no more than min assist and will cut food mod I level w/ A/E Baseline:  Goal status: NOT MET (Max assist for dressing, dependent for cutting food)  4. Pt will be independent with aquatic exercise program addressing BUE active movement and strength    Goal status: MET (but due to decline unable to do everything)      ASSESSMENT:  CLINICAL IMPRESSION: Pt continues to demo decline in Lt hand function and NOW LUE weakness with no active elbow ext against gravity. Pt with increased hoarseness in voice. Pt agrees to d/c today and return when appropriate w/ new MD orders after nerve stimulator placed.   PERFORMANCE DEFICITS: in functional skills including ADLs, IADLs, coordination, dexterity, proprioception, sensation, edema, tone, ROM, strength, pain, muscle spasms, Fine motor control, Gross motor control, endurance, decreased knowledge of use of DME, and UE functional use  IMPAIRMENTS: are limiting patient from ADLs, IADLs, rest and sleep, work, and leisure.   CO-MORBIDITIES: has co-morbidities such as CRPS  that affects occupational performance. Patient will benefit from skilled OT to address above impairments and improve overall function.  REHAB POTENTIAL: Good   PLAN:  OT FREQUENCY: 2x/week  OT DURATION: 8 ADDITIONAL weeks  PLANNED INTERVENTIONS: 97168 OT Re-evaluation, 97535 self care/ADL training, 60454 therapeutic exercise, 97530 therapeutic activity, 97112 neuromuscular re-education, 97140 manual therapy, 97113 aquatic therapy, 97035 ultrasound, 97018 paraffin, 09811 fluidotherapy, 97010 moist heat, 97010 cryotherapy, 97034 contrast bath, 97032 electrical stimulation (manual), 97014 electrical stimulation unattended, 97760 Splinting (initial encounter), passive range of motion, functional mobility training, coping strategies training, patient/family education, and DME and/or AE instructions  RECOMMENDED OTHER SERVICES: may have potential for counseling - discussed with patient - he feels he is ok now, but will keep it in mind  CONSULTED AND AGREED WITH PLAN OF CARE: Patient  PLAN  D/C O.T. - pt can return if/when appropriate w/  new MD orders after nerve stimulator placed and any restrictions lifted (around April 2025)    Sheran Lawless, OT 09/16/2023, 9:40 AM

## 2023-09-16 NOTE — Telephone Encounter (Signed)
Received FMLA paperwork through the fax. Spoke with the patient and he would like them faxed in once they are completed. He paid the $25 form fee. Forms are in Dr. Adaline Sill box

## 2023-09-16 NOTE — Patient Instructions (Signed)
Weight Bearing (Standing)   Rest palms comfortably on table (on upside down bowls w/ shelf liner under and over bowls), gently lean forward and bac, then to the side and forward over hand. Hold _10___ seconds. WIFE NEEDS TO SUPPORT RT ELBOW AND HAND Repeat __10__ times. Do __2__ sessions per day.   Chin Protraction / Retraction    Standing w/ back against wall, Push head back against pillow or foam, pulling chin in. Hold each position _10__ seconds. ALSO, work on bringing shoulders back to wall (not up to ears) for posture at the same time Repeat _10__ times. Do _3__ sessions per day.  SHOULDER: Flexion Unilateral    Raise Lt arm overhead. Keep thumb pointed up. _10__ reps per set, _2-3__ sets per day  Elbow Flexion / Extension    Hold Lt arm at side, palm forward. Bend Lt elbow, bringing hand toward shoulder. Then straighten to start position. Repeat sequence _10___ times per session. Do __2-3__ sessions per day  Combination Movement (Active)    Keep elbow firmly at side with wrist straight. Make lazy eights using palm up and down movements. Repeat __10__ times. Do _2-3___ sessions per day.  AROM: Wrist Extension    With left palm down, bend wrist up. Repeat __10__ times per set. Do __2-3__ sessions per day.  CONTINUE TO HAVE WIFE STRETCH BOTH HANDS.  CONTINUE RETROGRADE MASSAGE AND ELEVATION FOR RT HAND.  CONTINUE ANY EARLY EX'S AT TABLE THAT YOU CAN STILL DO FOR RT ARM

## 2023-09-17 NOTE — Telephone Encounter (Signed)
Papers put on Dr. Jorge Ny desk.

## 2023-09-20 NOTE — Telephone Encounter (Signed)
Pt called in wanting to make sure we had the correct fax number for his FMLA. He needs it faxed to [phone] ATTN: [name]

## 2023-09-20 NOTE — Telephone Encounter (Signed)
Called pt and left message that I faxed the papers to [place] this AM. It was the correct fax number.

## 2023-09-21 ENCOUNTER — Other Ambulatory Visit (HOSPITAL_COMMUNITY): Payer: Self-pay | Admitting: Otolaryngology

## 2023-09-21 DIAGNOSIS — J3801 Paralysis of vocal cords and larynx, unilateral: Secondary | ICD-10-CM | POA: Diagnosis not present

## 2023-09-21 DIAGNOSIS — R221 Localized swelling, mass and lump, neck: Secondary | ICD-10-CM | POA: Diagnosis not present

## 2023-09-21 DIAGNOSIS — R490 Dysphonia: Secondary | ICD-10-CM | POA: Diagnosis not present

## 2023-09-22 ENCOUNTER — Other Ambulatory Visit: Payer: BC Managed Care – PPO

## 2023-09-23 ENCOUNTER — Other Ambulatory Visit: Payer: Self-pay

## 2023-09-23 ENCOUNTER — Encounter
Admission: RE | Admit: 2023-09-23 | Discharge: 2023-09-23 | Disposition: A | Discharge status: Home / Self Care | Payer: BC Managed Care – PPO | Source: Ambulatory Visit | Attending: Neurosurgery | Admitting: Neurosurgery

## 2023-09-23 VITALS — Ht 68.0 in | Wt 168.0 lb

## 2023-09-23 DIAGNOSIS — I10 Essential (primary) hypertension: Secondary | ICD-10-CM

## 2023-09-23 DIAGNOSIS — Z01810 Encounter for preprocedural cardiovascular examination: Secondary | ICD-10-CM

## 2023-09-23 DIAGNOSIS — Z01812 Encounter for preprocedural laboratory examination: Secondary | ICD-10-CM

## 2023-09-23 HISTORY — DX: Secondary malignant neoplasm of brain: C79.49

## 2023-09-23 HISTORY — DX: Essential (primary) hypertension: I10

## 2023-09-23 HISTORY — DX: Neuralgia and neuritis, unspecified: M79.2

## 2023-09-23 HISTORY — DX: Centrilobular emphysema: J43.2

## 2023-09-23 HISTORY — DX: Secondary malignant neoplasm of brain: C79.31

## 2023-09-23 HISTORY — DX: Complex regional pain syndrome i of upper limb, bilateral: G90.513

## 2023-09-23 HISTORY — DX: Dysphonia: R49.0

## 2023-09-23 HISTORY — DX: Hypothyroidism, unspecified: E03.9

## 2023-09-23 HISTORY — DX: Personal history of urinary calculi: Z87.442

## 2023-09-23 HISTORY — DX: Prediabetes: R73.03

## 2023-09-23 HISTORY — DX: Brachial plexus disorders: G54.0

## 2023-09-23 HISTORY — DX: Pleural effusion, not elsewhere classified: J90

## 2023-09-23 NOTE — Patient Instructions (Addendum)
Your procedure is scheduled on: Tuesday, March 4 Report to the Registration Desk on the 1st floor of the [facility] To find out your arrival time, please call [phone] between 1PM - 3PM on: Monday, March 3 If your arrival time is 6:00 am, do not arrive before that time as the [facility] entrance doors do not open until 6:00 am.  REMEMBER: Instructions that are not followed completely may result in serious medical risk, up to and including death; or upon the discretion of your surgeon and anesthesiologist your surgery may need to be rescheduled.  Do not eat food after midnight the night before surgery.  No gum chewing or hard candies.  You may however, drink CLEAR liquids up to 2 hours before you are scheduled to arrive for your surgery. Do not drink anything within 2 hours of your scheduled arrival time.  Clear liquids include: - water  - apple juice without pulp - gatorade (not RED colors) - black coffee or tea (Do NOT add milk or creamers to the coffee or tea) Do NOT drink anything that is not on this list.  One week prior to surgery: starting February 25 Stop ANY OVER THE COUNTER supplements until after surgery.  You may however, continue to take Tylenol if needed for pain up until the day of surgery.  [name] (alectinib) - "Dr.Mohamed recommended holding it the day before the procedure, day of, and the day after."   Continue taking all of your other prescription medications up until the day of surgery.  ON THE DAY OF SURGERY ONLY TAKE THESE MEDICATIONS WITH SIPS OF WATER:  amLODipine  atorvastatin (LIPITOR)  DULoxetine (CYMBALTA)  levothyroxine (SYNTHROID)  venlafaxine (EFFEXOR)  TRELEGY ELLIPTA inhaler  Use inhaler on the day of surgery and bring your albuterol inhaler to the hospital.  No Alcohol for 24 hours before or after surgery.  No Smoking including e-cigarettes for 24 hours before surgery.  No chewable tobacco products for at least 6 hours before  surgery.  No nicotine patches on the day of surgery.  Do not use any "recreational" drugs for at least a week (preferably 2 weeks) before your surgery.  Please be advised that the combination of cocaine and anesthesia may have negative outcomes, up to and including death. If you test positive for cocaine, your surgery will be cancelled.  On the morning of surgery brush your teeth with toothpaste and water, you may rinse your mouth with mouthwash if you wish. Do not swallow any toothpaste or mouthwash.  Use CHG Soap as directed on instruction sheet.  Do not wear jewelry, make-up, hairpins, clips or nail polish.  For welded (permanent) jewelry: bracelets, anklets, waist bands, etc.  Please have this removed prior to surgery.  If it is not removed, there is a chance that hospital personnel will need to cut it off on the day of surgery.  Do not wear lotions, powders, or perfumes.   Do not shave body hair from the neck down 48 hours before surgery.  Contact lenses, hearing aids and dentures may not be worn into surgery.  Do not bring valuables to the hospital. [facility] is not responsible for any missing/lost belongings or valuables.   Notify your doctor if there is any change in your medical condition (cold, fever, infection).  Wear comfortable clothing (specific to your surgery type) to the hospital.  After surgery, you can help prevent lung complications by doing breathing exercises.  Take deep breaths and cough every 1-2 hours.  If you are being discharged the day of surgery, you will not be allowed to drive home. You will need a responsible individual to drive you home and stay with you for 24 hours after surgery.   If you are taking public transportation, you will need to have a responsible individual with you.  Please call the Pre-admissions Testing Dept. at [phone] if you have any questions about these instructions.  Surgery Visitation Policy:  Patients having  surgery or a procedure may have two visitors.  Children under the age of 16 must have an adult with them who is not the patient.  Temporary Visitor Restrictions Due to increasing cases of flu, RSV and COVID-19: Children ages 12 and under will not be able to visit patients in [facility] hospitals under most circumstances.     Pre-operative 5 CHG Bath Instructions   You can play a key role in reducing the risk of infection after surgery. Your skin needs to be as free of germs as possible. You can reduce the number of germs on your skin by washing with CHG (chlorhexidine gluconate) soap before surgery. CHG is an antiseptic soap that kills germs and continues to kill germs even after washing.   DO NOT use if you have an allergy to chlorhexidine/CHG or antibacterial soaps. If your skin becomes reddened or irritated, stop using the CHG and notify one of our RNs at [phone].   Please shower with the CHG soap starting 4 days before surgery using the following schedule:     Please keep in mind the following:  DO NOT shave, including legs and underarms, starting the day of your first shower.   You may shave your face at any point before/day of surgery.  Place clean sheets on your bed the day you start using CHG soap. Use a clean washcloth (not used since being washed) for each shower. DO NOT sleep with pets once you start using the CHG.   CHG Shower Instructions:  If you choose to wash your hair and private area, wash first with your normal shampoo/soap.  After you use shampoo/soap, rinse your hair and body thoroughly to remove shampoo/soap residue.  Turn the water OFF and apply about 3 tablespoons (45 ml) of CHG soap to a CLEAN washcloth.  Apply CHG soap ONLY FROM YOUR NECK DOWN TO YOUR TOES (washing for 3-5 minutes)  DO NOT use CHG soap on face, private areas, open wounds, or sores.  Pay special attention to the area where your surgery is being performed.  If you are having back  surgery, having someone wash your back for you may be helpful. Wait 2 minutes after CHG soap is applied, then you may rinse off the CHG soap.  Pat dry with a clean towel  Put on clean clothes/pajamas   If you choose to wear lotion, please use ONLY the CHG-compatible lotions on the back of this paper.     Additional instructions for the day of surgery: DO NOT APPLY any lotions, deodorants, cologne, or perfumes.   Put on clean/comfortable clothes.  Brush your teeth.  Ask your nurse before applying any prescription medications to the skin.      CHG Compatible Lotions   Aveeno Moisturizing lotion  Cetaphil Moisturizing Cream  Cetaphil Moisturizing Lotion  Clairol Herbal Essence Moisturizing Lotion, Dry Skin  Clairol Herbal Essence Moisturizing Lotion, Extra Dry Skin  Clairol Herbal Essence Moisturizing Lotion, Normal Skin  Curel Age Defying Therapeutic Moisturizing Lotion with Alpha Hydroxy  Curel  Extreme Care Body Lotion  Curel Soothing Hands Moisturizing Hand Lotion  Curel Therapeutic Moisturizing Cream, Fragrance-Free  Curel Therapeutic Moisturizing Lotion, Fragrance-Free  Curel Therapeutic Moisturizing Lotion, Original Formula  Eucerin Daily Replenishing Lotion  Eucerin Dry Skin Therapy Plus Alpha Hydroxy Crme  Eucerin Dry Skin Therapy Plus Alpha Hydroxy Lotion  Eucerin Original Crme  Eucerin Original Lotion  Eucerin Plus Crme Eucerin Plus Lotion  Eucerin TriLipid Replenishing Lotion  Keri Anti-Bacterial Hand Lotion  Keri Deep Conditioning Original Lotion Dry Skin Formula Softly Scented  Keri Deep Conditioning Original Lotion, Fragrance Free Sensitive Skin Formula  Keri Lotion Fast Absorbing Fragrance Free Sensitive Skin Formula  Keri Lotion Fast Absorbing Softly Scented Dry Skin Formula  Keri Original Lotion  Keri Skin Renewal Lotion Keri Silky Smooth Lotion  Keri Silky Smooth Sensitive Skin Lotion  Nivea Body Creamy Conditioning Oil  Nivea Body Extra Enriched  Teacher, adult education Moisturizing Lotion Nivea Crme  Nivea Skin Firming Lotion  NutraDerm 30 Skin Lotion  NutraDerm Skin Lotion  NutraDerm Therapeutic Skin Cream  NutraDerm Therapeutic Skin Lotion  ProShield Protective Hand Cream  Provon moisturizing lotion

## 2023-09-24 ENCOUNTER — Encounter
Admission: RE | Admit: 2023-09-24 | Discharge: 2023-09-24 | Disposition: A | Discharge status: Home / Self Care | Payer: BC Managed Care – PPO | Source: Ambulatory Visit | Attending: Neurosurgery | Admitting: Neurosurgery

## 2023-09-24 ENCOUNTER — Other Ambulatory Visit: Payer: Self-pay | Admitting: Student in an Organized Health Care Education/Training Program

## 2023-09-24 DIAGNOSIS — G894 Chronic pain syndrome: Secondary | ICD-10-CM

## 2023-09-24 DIAGNOSIS — Z01812 Encounter for preprocedural laboratory examination: Secondary | ICD-10-CM

## 2023-09-24 DIAGNOSIS — Z01818 Encounter for other preprocedural examination: Secondary | ICD-10-CM | POA: Diagnosis not present

## 2023-09-24 DIAGNOSIS — G540 Brachial plexus disorders: Secondary | ICD-10-CM

## 2023-09-24 DIAGNOSIS — Z01810 Encounter for preprocedural cardiovascular examination: Secondary | ICD-10-CM

## 2023-09-24 DIAGNOSIS — M792 Neuralgia and neuritis, unspecified: Secondary | ICD-10-CM

## 2023-09-24 DIAGNOSIS — I10 Essential (primary) hypertension: Secondary | ICD-10-CM | POA: Diagnosis not present

## 2023-09-24 DIAGNOSIS — G90511 Complex regional pain syndrome I of right upper limb: Secondary | ICD-10-CM

## 2023-09-24 LAB — TYPE AND SCREEN
ABO/RH(D): A POS
Antibody Screen: NEGATIVE

## 2023-09-24 LAB — SURGICAL PCR SCREEN
MRSA, PCR: NEGATIVE
Staphylococcus aureus: POSITIVE — AB

## 2023-09-27 ENCOUNTER — Ambulatory Visit (HOSPITAL_COMMUNITY)
Admission: RE | Admit: 2023-09-27 | Discharge: 2023-09-27 | Disposition: A | Discharge status: Home / Self Care | Payer: BC Managed Care – PPO | Source: Ambulatory Visit | Attending: Otolaryngology | Admitting: Otolaryngology

## 2023-09-27 DIAGNOSIS — M47812 Spondylosis without myelopathy or radiculopathy, cervical region: Secondary | ICD-10-CM | POA: Diagnosis not present

## 2023-09-27 DIAGNOSIS — J3801 Paralysis of vocal cords and larynx, unilateral: Secondary | ICD-10-CM | POA: Insufficient documentation

## 2023-09-27 DIAGNOSIS — G839 Paralytic syndrome, unspecified: Secondary | ICD-10-CM | POA: Diagnosis not present

## 2023-09-27 DIAGNOSIS — C7989 Secondary malignant neoplasm of other specified sites: Secondary | ICD-10-CM | POA: Diagnosis not present

## 2023-09-27 DIAGNOSIS — Z85118 Personal history of other malignant neoplasm of bronchus and lung: Secondary | ICD-10-CM | POA: Diagnosis not present

## 2023-09-27 MED ORDER — IOHEXOL 350 MG/ML SOLN
75.0000 mL | Freq: Once | INTRAVENOUS | Status: AC | PRN
  Administered 2023-09-27: 75 mL via INTRAVENOUS

## 2023-09-29 ENCOUNTER — Encounter: Payer: Self-pay | Admitting: Radiation Therapy

## 2023-09-29 NOTE — Progress Notes (Signed)
 Pt cancelled brain MRI due to not being able to lay flat for the scan. He is having a pain stimulator placed on 3/4. We were trying to have this scan completed before then, but pt is not able.  We will reschedule this brain MRI in May, hoping he will have recovered well from surgery and have pain relief for the scan.  [name] PA-C is aware.   Jalene Mullet R.T.(R)(T) Radiation Special Procedures Lead

## 2023-10-04 ENCOUNTER — Ambulatory Visit (HOSPITAL_COMMUNITY): Admission: RE | Admit: 2023-10-04 | Payer: BC Managed Care – PPO | Source: Ambulatory Visit

## 2023-10-05 ENCOUNTER — Ambulatory Visit: Payer: Self-pay | Admitting: Urgent Care

## 2023-10-05 ENCOUNTER — Ambulatory Visit

## 2023-10-05 ENCOUNTER — Encounter
Admission: RE | Disposition: A | Discharge status: Home / Self Care | Payer: Self-pay | Source: Home / Self Care | Attending: Neurosurgery

## 2023-10-05 ENCOUNTER — Ambulatory Visit
Admission: RE | Admit: 2023-10-05 | Discharge: 2023-10-05 | Disposition: A | Discharge status: Home / Self Care | Payer: BC Managed Care – PPO | Attending: Neurosurgery | Admitting: Neurosurgery

## 2023-10-05 ENCOUNTER — Other Ambulatory Visit: Payer: Self-pay

## 2023-10-05 ENCOUNTER — Ambulatory Visit: Admitting: Anesthesiology

## 2023-10-05 ENCOUNTER — Encounter: Payer: Self-pay | Admitting: Neurosurgery

## 2023-10-05 ENCOUNTER — Telehealth: Payer: Self-pay | Admitting: Neurology

## 2023-10-05 DIAGNOSIS — Z87891 Personal history of nicotine dependence: Secondary | ICD-10-CM | POA: Insufficient documentation

## 2023-10-05 DIAGNOSIS — G540 Brachial plexus disorders: Secondary | ICD-10-CM | POA: Diagnosis not present

## 2023-10-05 DIAGNOSIS — J432 Centrilobular emphysema: Secondary | ICD-10-CM | POA: Diagnosis not present

## 2023-10-05 DIAGNOSIS — G629 Polyneuropathy, unspecified: Secondary | ICD-10-CM | POA: Diagnosis not present

## 2023-10-05 DIAGNOSIS — G90511 Complex regional pain syndrome I of right upper limb: Secondary | ICD-10-CM | POA: Diagnosis not present

## 2023-10-05 DIAGNOSIS — G90512 Complex regional pain syndrome I of left upper limb: Secondary | ICD-10-CM | POA: Diagnosis not present

## 2023-10-05 DIAGNOSIS — Z9682 Presence of neurostimulator: Secondary | ICD-10-CM | POA: Diagnosis not present

## 2023-10-05 DIAGNOSIS — E119 Type 2 diabetes mellitus without complications: Secondary | ICD-10-CM | POA: Diagnosis not present

## 2023-10-05 DIAGNOSIS — Z85118 Personal history of other malignant neoplasm of bronchus and lung: Secondary | ICD-10-CM | POA: Diagnosis not present

## 2023-10-05 DIAGNOSIS — G6282 Radiation-induced polyneuropathy: Secondary | ICD-10-CM | POA: Diagnosis not present

## 2023-10-05 DIAGNOSIS — M792 Neuralgia and neuritis, unspecified: Secondary | ICD-10-CM | POA: Diagnosis not present

## 2023-10-05 DIAGNOSIS — G894 Chronic pain syndrome: Secondary | ICD-10-CM | POA: Diagnosis not present

## 2023-10-05 DIAGNOSIS — I10 Essential (primary) hypertension: Secondary | ICD-10-CM | POA: Diagnosis not present

## 2023-10-05 DIAGNOSIS — M79601 Pain in right arm: Secondary | ICD-10-CM | POA: Diagnosis present

## 2023-10-05 DIAGNOSIS — Z01818 Encounter for other preprocedural examination: Secondary | ICD-10-CM

## 2023-10-05 DIAGNOSIS — Z85841 Personal history of malignant neoplasm of brain: Secondary | ICD-10-CM | POA: Diagnosis not present

## 2023-10-05 DIAGNOSIS — Z85848 Personal history of malignant neoplasm of other parts of nervous tissue: Secondary | ICD-10-CM | POA: Diagnosis not present

## 2023-10-05 DIAGNOSIS — Y829 Unspecified medical devices associated with adverse incidents: Secondary | ICD-10-CM | POA: Diagnosis not present

## 2023-10-05 DIAGNOSIS — G90513 Complex regional pain syndrome I of upper limb, bilateral: Secondary | ICD-10-CM | POA: Diagnosis not present

## 2023-10-05 HISTORY — PX: SPINAL CORD STIMULATOR INSERTION: SHX5378

## 2023-10-05 LAB — ABO/RH: ABO/RH(D): A POS

## 2023-10-05 SURGERY — CERVICAL SPINAL CORD STIMULATOR INSERTION
Anesthesia: General | Site: Spine Cervical

## 2023-10-05 MED ORDER — VASOPRESSIN 20 UNIT/ML IV SOLN
INTRAVENOUS | Status: DC | PRN
  Administered 2023-10-05 (×2): 1 [IU] via INTRAVENOUS

## 2023-10-05 MED ORDER — DOCUSATE SODIUM 100 MG PO CAPS: 100.0000 mg | ORAL_CAPSULE | Freq: Two times a day (BID) | ORAL | 0 refills | Status: DC

## 2023-10-05 MED ORDER — IRRISEPT - 450ML BOTTLE WITH 0.05% CHG IN STERILE WATER, USP 99.95% OPTIME
TOPICAL | Status: DC | PRN
  Administered 2023-10-05: 450 mL

## 2023-10-05 MED ORDER — SODIUM CHLORIDE (PF) 0.9 % IJ SOLN
INTRAMUSCULAR | Status: DC | PRN
  Administered 2023-10-05: 30 mL via INTRAMUSCULAR

## 2023-10-05 MED ORDER — BUPIVACAINE-EPINEPHRINE (PF) 0.5% -1:200000 IJ SOLN
INTRAMUSCULAR | Status: DC | PRN
  Administered 2023-10-05: 17 mL

## 2023-10-05 MED ORDER — REMIFENTANIL HCL 1 MG IV SOLR
INTRAVENOUS | Status: DC | PRN
  Administered 2023-10-05: .1 ug/kg/min via INTRAVENOUS

## 2023-10-05 MED ORDER — EPHEDRINE SULFATE-NACL 50-0.9 MG/10ML-% IV SOSY
PREFILLED_SYRINGE | INTRAVENOUS | Status: DC | PRN
  Administered 2023-10-05 (×3): 10 mg via INTRAVENOUS
  Administered 2023-10-05: 5 mg via INTRAVENOUS
  Administered 2023-10-05: 10 mg via INTRAVENOUS

## 2023-10-05 MED ORDER — BUPIVACAINE LIPOSOME 1.3 % IJ SUSP
INTRAMUSCULAR | Status: AC
  Filled 2023-10-05: qty 20

## 2023-10-05 MED ORDER — MIDAZOLAM HCL 2 MG/2ML IJ SOLN
INTRAMUSCULAR | Status: DC | PRN
  Administered 2023-10-05: 2 mg via INTRAVENOUS

## 2023-10-05 MED ORDER — FENTANYL CITRATE (PF) 100 MCG/2ML IJ SOLN
25.0000 ug | INTRAMUSCULAR | Status: DC | PRN
  Administered 2023-10-05 (×3): 25 ug via INTRAVENOUS

## 2023-10-05 MED ORDER — CHLORHEXIDINE GLUCONATE 4 % EX SOLN: 1.0000 | CUTANEOUS | 1 refills | Status: DC

## 2023-10-05 MED ORDER — PHENYLEPHRINE HCL-NACL 20-0.9 MG/250ML-% IV SOLN
INTRAVENOUS | Status: DC | PRN
  Administered 2023-10-05: 40 ug/min via INTRAVENOUS

## 2023-10-05 MED ORDER — PROPOFOL 10 MG/ML IV BOLUS
INTRAVENOUS | Status: AC
  Filled 2023-10-05: qty 20

## 2023-10-05 MED ORDER — KETOROLAC TROMETHAMINE 30 MG/ML IJ SOLN
INTRAMUSCULAR | Status: AC
  Filled 2023-10-05: qty 1

## 2023-10-05 MED ORDER — ONDANSETRON HCL 4 MG/2ML IJ SOLN
INTRAMUSCULAR | Status: DC | PRN
  Administered 2023-10-05: 4 mg via INTRAVENOUS

## 2023-10-05 MED ORDER — PROPOFOL 1000 MG/100ML IV EMUL
INTRAVENOUS | Status: AC
  Filled 2023-10-05: qty 100

## 2023-10-05 MED ORDER — SUCCINYLCHOLINE CHLORIDE 200 MG/10ML IV SOSY
PREFILLED_SYRINGE | INTRAVENOUS | Status: DC | PRN
  Administered 2023-10-05: 120 mg via INTRAVENOUS

## 2023-10-05 MED ORDER — 0.9 % SODIUM CHLORIDE (POUR BTL) OPTIME
TOPICAL | Status: DC | PRN
  Administered 2023-10-05: 500 mL

## 2023-10-05 MED ORDER — OXYCODONE HCL 5 MG PO TABS: 5.0000 mg | ORAL_TABLET | Freq: Once | ORAL | Status: DC | PRN

## 2023-10-05 MED ORDER — CEFAZOLIN SODIUM-DEXTROSE 2-4 GM/100ML-% IV SOLN
2.0000 g | Freq: Once | INTRAVENOUS | Status: AC
  Administered 2023-10-05: 2 g via INTRAVENOUS

## 2023-10-05 MED ORDER — FENTANYL CITRATE (PF) 100 MCG/2ML IJ SOLN
INTRAMUSCULAR | Status: DC | PRN
  Administered 2023-10-05 (×2): 50 ug via INTRAVENOUS

## 2023-10-05 MED ORDER — MUPIROCIN 2 % EX OINT: 1.0000 | TOPICAL_OINTMENT | Freq: Two times a day (BID) | CUTANEOUS | 0 refills | Status: AC

## 2023-10-05 MED ORDER — PHENYLEPHRINE HCL-NACL 20-0.9 MG/250ML-% IV SOLN
INTRAVENOUS | Status: AC
  Filled 2023-10-05: qty 250

## 2023-10-05 MED ORDER — FENTANYL CITRATE (PF) 100 MCG/2ML IJ SOLN
INTRAMUSCULAR | Status: AC
  Filled 2023-10-05: qty 2

## 2023-10-05 MED ORDER — LIDOCAINE HCL (CARDIAC) PF 100 MG/5ML IV SOSY
PREFILLED_SYRINGE | INTRAVENOUS | Status: DC | PRN
  Administered 2023-10-05: 60 mg via INTRAVENOUS

## 2023-10-05 MED ORDER — CHLORHEXIDINE GLUCONATE 0.12 % MT SOLN
OROMUCOSAL | Status: AC
  Filled 2023-10-05: qty 15

## 2023-10-05 MED ORDER — BUPIVACAINE HCL (PF) 0.5 % IJ SOLN
INTRAMUSCULAR | Status: AC
  Filled 2023-10-05: qty 30

## 2023-10-05 MED ORDER — BUPIVACAINE-EPINEPHRINE (PF) 0.5% -1:200000 IJ SOLN
INTRAMUSCULAR | Status: AC
  Filled 2023-10-05: qty 10

## 2023-10-05 MED ORDER — OXYCODONE HCL 5 MG/5ML PO SOLN: 5.0000 mg | Freq: Once | ORAL | Status: DC | PRN

## 2023-10-05 MED ORDER — SENNA 8.6 MG PO TABS: 1.0000 | ORAL_TABLET | Freq: Every day | ORAL | 0 refills | Status: DC

## 2023-10-05 MED ORDER — CHLORHEXIDINE GLUCONATE 0.12 % MT SOLN
15.0000 mL | Freq: Once | OROMUCOSAL | Status: AC
  Administered 2023-10-05: 15 mL via OROMUCOSAL

## 2023-10-05 MED ORDER — VANCOMYCIN HCL IN DEXTROSE 1-5 GM/200ML-% IV SOLN
INTRAVENOUS | Status: AC
  Filled 2023-10-05: qty 200

## 2023-10-05 MED ORDER — LACTATED RINGERS IV SOLN: INTRAVENOUS | Status: DC

## 2023-10-05 MED ORDER — PROPOFOL 500 MG/50ML IV EMUL
INTRAVENOUS | Status: DC | PRN
Start: 2023-10-05 — End: 2023-10-05
  Administered 2023-10-05: 80 ug/kg/min via INTRAVENOUS

## 2023-10-05 MED ORDER — PROPOFOL 10 MG/ML IV BOLUS
INTRAVENOUS | Status: DC | PRN
  Administered 2023-10-05: 50 mg via INTRAVENOUS
  Administered 2023-10-05: 150 mg via INTRAVENOUS

## 2023-10-05 MED ORDER — MIDAZOLAM HCL 2 MG/2ML IJ SOLN
INTRAMUSCULAR | Status: AC
  Filled 2023-10-05: qty 2

## 2023-10-05 MED ORDER — SURGIFLO WITH THROMBIN (HEMOSTATIC MATRIX KIT) OPTIME
TOPICAL | Status: DC | PRN
  Administered 2023-10-05: 1 via TOPICAL

## 2023-10-05 MED ORDER — CYCLOBENZAPRINE HCL 5 MG PO TABS: 5.0000 mg | ORAL_TABLET | Freq: Three times a day (TID) | ORAL | 0 refills | Status: DC | PRN

## 2023-10-05 MED ORDER — KETOROLAC TROMETHAMINE 30 MG/ML IJ SOLN
INTRAMUSCULAR | Status: DC | PRN
  Administered 2023-10-05: 30 mg via INTRAVENOUS

## 2023-10-05 MED ORDER — BACITRACIN ZINC 500 UNIT/GM EX OINT
TOPICAL_OINTMENT | CUTANEOUS | Status: AC
  Filled 2023-10-05: qty 28.35

## 2023-10-05 MED ORDER — PHENYLEPHRINE 80 MCG/ML (10ML) SYRINGE FOR IV PUSH (FOR BLOOD PRESSURE SUPPORT)
PREFILLED_SYRINGE | INTRAVENOUS | Status: DC | PRN
  Administered 2023-10-05 (×4): 80 ug via INTRAVENOUS
  Administered 2023-10-05: 160 ug via INTRAVENOUS

## 2023-10-05 MED ORDER — SODIUM CHLORIDE (PF) 0.9 % IJ SOLN
INTRAMUSCULAR | Status: AC
  Filled 2023-10-05: qty 20

## 2023-10-05 MED ORDER — VANCOMYCIN HCL IN DEXTROSE 1-5 GM/200ML-% IV SOLN
1000.0000 mg | Freq: Once | INTRAVENOUS | Status: AC
  Administered 2023-10-05: 1000 mg via INTRAVENOUS

## 2023-10-05 MED ORDER — CEFAZOLIN SODIUM-DEXTROSE 2-4 GM/100ML-% IV SOLN
INTRAVENOUS | Status: AC
  Filled 2023-10-05: qty 100

## 2023-10-05 MED ORDER — LACTATED RINGERS IV SOLN: INTRAVENOUS | Status: DC | PRN

## 2023-10-05 MED ORDER — ORAL CARE MOUTH RINSE: 15.0000 mL | Freq: Once | OROMUCOSAL | Status: AC

## 2023-10-05 MED ORDER — REMIFENTANIL HCL 1 MG IV SOLR
INTRAVENOUS | Status: AC
  Filled 2023-10-05: qty 1000

## 2023-10-05 MED ORDER — OXYCODONE HCL 5 MG PO TABS: 5.0000 mg | ORAL_TABLET | ORAL | 0 refills | Status: DC | PRN

## 2023-10-05 MED ORDER — ACETAMINOPHEN 10 MG/ML IV SOLN
1000.0000 mg | Freq: Once | INTRAVENOUS | Status: AC
  Administered 2023-10-05: 1000 mg via INTRAVENOUS

## 2023-10-05 MED ORDER — ACETAMINOPHEN 10 MG/ML IV SOLN
INTRAVENOUS | Status: AC
  Filled 2023-10-05: qty 100

## 2023-10-05 MED ORDER — DEXAMETHASONE SODIUM PHOSPHATE 10 MG/ML IJ SOLN
INTRAMUSCULAR | Status: DC | PRN
  Administered 2023-10-05: 5 mg via INTRAVENOUS

## 2023-10-05 SURGICAL SUPPLY — 47 items
BUR NEURO DRILL SOFT 3.0X3.8M (BURR) ×1 IMPLANT
CABLE OR STIMULATOR 2X8 61 (WIRE) IMPLANT
CONTROL REMOTE FREELINK ALPHA (NEUROSURGERY SUPPLIES) IMPLANT
DERMABOND ADVANCED .7 DNX12 (GAUZE/BANDAGES/DRESSINGS) ×2 IMPLANT
DRAPE C-ARM XRAY 36X54 (DRAPES) ×2 IMPLANT
DRAPE SURG ORHT 6 SPLT 77X108 (DRAPES) ×2 IMPLANT
DRSG OPSITE POSTOP 3X4 (GAUZE/BANDAGES/DRESSINGS) IMPLANT
DRSG OPSITE POSTOP 4X6 (GAUZE/BANDAGES/DRESSINGS) IMPLANT
DRSG OPSITE POSTOP 4X8 (GAUZE/BANDAGES/DRESSINGS) IMPLANT
ELECT REM PT RETURN 9FT ADLT (ELECTROSURGICAL) ×1 IMPLANT
ELECTRODE REM PT RTRN 9FT ADLT (ELECTROSURGICAL) ×1 IMPLANT
ELEVATER PASSER (SPINAL CORD STIMULATOR) ×1 IMPLANT
FEE INTRAOP CADWELL SUPPLY NCS (MISCELLANEOUS) IMPLANT
FEE INTRAOP MONITOR IMPULS NCS (MISCELLANEOUS) IMPLANT
GLOVE BIOGEL PI IND STRL 7.0 (GLOVE) ×1 IMPLANT
GLOVE BIOGEL PI IND STRL 8 (GLOVE) ×1 IMPLANT
GLOVE SURG SYN 7.0 (GLOVE) ×1 IMPLANT
GLOVE SURG SYN 7.0 PF PI (GLOVE) ×1 IMPLANT
GLOVE SURG SYN 7.5 E (GLOVE) ×1 IMPLANT
GLOVE SURG SYN 7.5 PF PI (GLOVE) ×1 IMPLANT
GOWN SRG LRG LVL 4 IMPRV REINF (GOWNS) ×1 IMPLANT
GOWN SRG XL LVL 3 NONREINFORCE (GOWNS) ×1 IMPLANT
INTRAOP CADWELL SUPPLY FEE NCS (MISCELLANEOUS) ×1 IMPLANT
INTRAOP MONITOR FEE IMPULS NCS (MISCELLANEOUS) ×1 IMPLANT
JET LAVAGE IRRISEPT WOUND (IRRIGATION / IRRIGATOR) ×1 IMPLANT
KIT CHARGING PRECISION NEURO (KITS) IMPLANT
KIT IPG ALPHA WAVEWRITER (Stimulator) IMPLANT
KIT SPINAL PRONEVIEW (KITS) ×1 IMPLANT
LAVAGE JET IRRISEPT WOUND (IRRIGATION / IRRIGATOR) IMPLANT
LEAD SURG ARTISAN 70CM (SPINAL CORD STIMULATOR) IMPLANT
MANIFOLD NEPTUNE II (INSTRUMENTS) ×1 IMPLANT
MARKER SKIN DUAL TIP RULER LAB (MISCELLANEOUS) IMPLANT
NDL SAFETY ECLIPSE 18X1.5 (NEEDLE) ×1 IMPLANT
NS IRRIG 500ML POUR BTL (IV SOLUTION) ×1 IMPLANT
PACK LAMINECTOMY ARMC (PACKS) ×1 IMPLANT
PAD ARMBOARD 7.5X6 YLW CONV (MISCELLANEOUS) ×1 IMPLANT
PASSER ELEVATOR (SPINAL CORD STIMULATOR) IMPLANT
STAPLER SKIN PROX 35W (STAPLE) IMPLANT
SURGIFLO W/THROMBIN 8M KIT (HEMOSTASIS) ×1 IMPLANT
SUT SILK 2 0SH CR/8 30 (SUTURE) ×1 IMPLANT
SUT SILK 3 0 REEL (SUTURE) IMPLANT
SUT STRATA 3-0 15 PS-2 (SUTURE) ×2 IMPLANT
SUT VIC AB 0 CT1 18XCR BRD 8 (SUTURE) ×2 IMPLANT
SUT VIC AB 2-0 CT1 18 (SUTURE) ×2 IMPLANT
SYR 30ML LL (SYRINGE) ×2 IMPLANT
TOOL LONG TUNNEL (SPINAL CORD STIMULATOR) IMPLANT
TRAP FLUID SMOKE EVACUATOR (MISCELLANEOUS) ×1 IMPLANT

## 2023-10-05 NOTE — Telephone Encounter (Signed)
 Received call and fax with Attending Physician Statement paperwork. Forms are in Dr. Adaline Sill box

## 2023-10-05 NOTE — Op Note (Signed)
 Indications: the patient is a 63yo male who was diagnosed with CRPS, chronic pain, chronic plexopathy, bilateral upper extremity pain.  After undergoing a thorough evaluation with pain management and exhausting all other conservative options they were evaluated and planned for a spinal cord stimulator of the cervical spine   Findings: successful placement of a cervical spinal cord stimulator.   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     Preoperative Note:    The patient was seen in the preoperative area.  They continue to have severe symptoms of refractory pain that is not being managed by all prior conservative methods.  Risks of surgery discussed in clinic and reiterated during the preoperative meeting.  Patient elected go forward with the procedure.   Operative Note:      The patient was then brought from the preoperative center with intravenous access established.  The patient underwent general anesthesia and endotracheal tube intubation, then was rotated on the OR table in [place] pins and head holder.  A midline incision was planned just caudal to the inion and ended centered over the C2/3 spinous processes.  Intraoperative neuromonitoring leads were placed.  The patient was then prepped and draped in the usual fashion with all of the pressure points padded.  Comprehensive timeout verifying the patient's name, MRN, planned procedure was performed.  After the timeout local anesthetic was infiltrated at the planned incision site at the base of the skull as well as the planned tunneling site on the left flank.   Skin and subcutaneous tissues  were opened sharply.  Bovie cautery was used to dissect down to the midline fascia.  Midline fascia was opened in a linear fashion.  We are able to palpate the cervical musculature and followed the avascular plane down to the C2 spinous process.  On the superior aspect of the C2 spinous process we followed this out to the C2 lamina.  We are able to then palpate the C1 ring.  The C1 ring was subperiosteal dissected laterally, with care taken not to go too far lateral to avoid the vertebral artery.  At this point we are able to palpate the base of the occiput.  Utilizing a upgoing curette the connective tissue was dissected from the deep aspect of the C1 ring.  Utilized a high-speed drill to perform a superior laminectomy of the C1 ring.  We are unable to reach underneath the C1 lamina and we did this until we are able to pass a [facility] across this level.  We then repeated this process at the C2 space.  Utilizing sharp dissection and curettes we are able to open the posterior ligament to expose the dura.  Once the dura was well exposed we then placed the trial dilator and the intended location.  We verified that this passed easily.  Once we are able to do this we ensured that we would have a good location for the spinal cord stimulator paddle.  The superior aspect of the C1 ring was again exposed and isolated.  The paddle was then placed in a retrograde fashion from the occiput to C1 interval, once we are able to see the paddle between C1 and C2 we are able to utilize a bayonet  to help pass this down more caudally.  We did this until the paddle was spanning the C1-C3 levels.  We verified this with fluoroscopy.  We also verified this with anterior posterior views to ensure the midline nature.  In order to ensure that we were getting the desired stimulation effect, we performed a collision test with intraoperative neuromonitoring.  Given his severe right upper extremity plexopathy, we had to use the lower  extremities as a motor lateralization verification.  After repositioning the stimulator 1 time to a more rightward approach we are able to get bilateral lower extremity stimulation and left upper extremity stimulation this verify that we were getting upper and lower extremity coverage bilaterally. then utilized a suture through the connective tissue immediately cranial to the C1 ring to secure the leads into place.  We then irrigated copiously.  We obtained meticulous hemostasis.  At this point we are then ready to pass down to the pocket site.  We had to use a 2 part pass given the length of the trajectory.  Utilizing a dilator we created a subcutaneous pocket from the cervical incision down to the planned battery pocket space on the left flank.   The battery was connected and checked for connections.  Once everything was clear we then placed it into the pocket.  We irrigated both pockets with copious amount of irrigation, Irrisept, and regular irrigation.  We then obtained hemostasis and began closure.  Once we are ready for closure we verified with final x-rays verifying that there was no lead migration.  The superficial layer was closed with a strata fix and glue  The wound was closed in multiple layers with sterile dressings applied.   Monitoring was stable throughout.   Patient was then rotated back to the preoperative bed awakened from anesthesia and taken to recovery. All counts are correct in this case.   I performed the entire procedure with the assistance of Brooke Frisbie PA-C as an Designer, television/film set. An assistant was required for this procedure due to the complexity.  The assistant provided assistance in tissue manipulation and suction, and was required for the successful and safe performance of the procedure. I performed the critical portions of the procedure.      Lovenia Kim, MD  Implant Name Type Inv. Item Serial No. Manufacturer Lot No. LRB No. Used Action  70cm 2x8  surgical lead kit, Artisan   [id number] [employer] NA N/A 1 Implanted  KIT IPG ALPHA WAVEWRITER - [id number] Stimulator KIT IPG ALPHA WAVEWRITER [id number] [employer] [id number] N/A 1 Implanted

## 2023-10-05 NOTE — Anesthesia Preprocedure Evaluation (Signed)
 Anesthesia Evaluation  Patient identified by MRN, date of birth, ID band Patient awake    Reviewed: Allergy & Precautions, NPO status , Patient's Chart, lab work & pertinent test results  History of Anesthesia Complications Negative for: history of anesthetic complications  Airway Mallampati: III  TM Distance: <3 FB Neck ROM: full    Dental  (+) Chipped   Pulmonary shortness of breath and with exertion, pneumonia, unresolved, COPD, former smoker    + decreased breath sounds      Cardiovascular hypertension, (-) angina (-) Past MI Normal cardiovascular exam     Neuro/Psych  Headaches  Neuromuscular disease  negative psych ROS   GI/Hepatic negative GI ROS, Neg liver ROS,,,  Endo/Other  diabetes, Type 2Hypothyroidism    Renal/GU      Musculoskeletal   Abdominal   Peds  Hematology negative hematology ROS (+)   Anesthesia Other Findings Past Medical History: No date: Centrilobular emphysema (HCC) No date: Complex regional pain syndrome affecting both upper arms No date: COPD (chronic obstructive pulmonary disease) (HCC) 2021: DVT of lower extremity, bilateral (HCC) No date: Essential hypertension No date: History of kidney stones No date: Hypothyroidism No date: Metastasis to brain [facility]) No date: 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 08/23/2018: Non-small cell cancer of right lung (HCC) No date: Pneumonia No date: Pre-diabetes No date: Radiation-induced brachial plexopathy No date: Recurrent right pleural effusion No date: Secondary malignant neoplasm of brain and spinal cord (HCC) 2016: Subarachnoid hemorrhage (HCC) No date: Voice hoarseness  Past Surgical History: No date: COLONOSCOPY 1969: FEMUR FRACTURE SURGERY; Left 06/24/2020: INGUINAL HERNIA REPAIR; Right     Comment:  Procedure: RIGHT OPEN INGUINAL HERNIA REPAIR WITH MESH;               Surgeon: Kinsinger, De Blanch, MD;  Location: [facility];                 Service: General;  Laterality: Right; 03/04/2020: IR IVC FILTER PLMT / S&I Lenise Arena GUID/MOD SED 08/15/2020: IR IVC FILTER RETRIEVAL / S&I Lenise Arena GUID/MOD SED 08/06/2020: IR RADIOLOGIST EVAL & MGMT  BMI    Body Mass Index: 25.54 kg/m      Reproductive/Obstetrics negative OB ROS                             Anesthesia Physical Anesthesia Plan  ASA: 3  Anesthesia Plan: General ETT   Post-op Pain Management:    Induction: Intravenous  PONV Risk Score and Plan: Ondansetron, Dexamethasone, Midazolam and Treatment may vary due to age or medical condition  Airway Management Planned: Oral ETT  Additional Equipment:   Intra-op Plan:   Post-operative Plan: Extubation in OR  Informed Consent: I have reviewed the patients History and Physical, chart, labs and discussed the procedure including the risks, benefits and alternatives for the proposed anesthesia with the patient or authorized representative who has indicated his/her understanding and acceptance.     Dental Advisory Given  Plan Discussed with: Anesthesiologist, CRNA and Surgeon  Anesthesia Plan Comments: (Patient has a hoarse voice at baseline from vocal cord paralysis. He was informed that we plan to use a smaller diameter ETT to try to minimize further damage to his vocal cords. But that damage to his vocal cords is a risk in this scenario. He voiced understanding and assent.  Patient consented for risks of anesthesia including but not limited to:  - adverse reactions to medications - damage to eyes, teeth, lips  or other oral mucosa - nerve damage due to positioning  - sore throat or hoarseness - Damage to heart, brain, nerves, lungs, other parts of body or loss of life  Patient voiced understanding and assent.)       Anesthesia Quick Evaluation

## 2023-10-05 NOTE — Discharge Instructions (Signed)
  Your surgeon has performed an operation on your cervical spine (neck) to place a spinal cord stimulator.   The following are instructions to help in your recovery once you have been discharged from the hospital. Even if you feel well, it is important that you follow these activity guidelines. If you do not let your neck heal properly from the surgery, you can increase the chance of return of your symptoms and other complications.  Activity    No bending, lifting, or twisting ("BLT"). Avoid lifting objects heavier than 10 pounds (gallon milk jug).  Where possible, avoid household activities that involve lifting, bending, reaching, pushing, or pulling such as laundry, vacuuming, grocery shopping, and childcare. Try to arrange for help from friends and family for these activities while your back heals.  Increase physical activity slowly as tolerated.  Taking short walks is encouraged, but avoid strenuous exercise. Do not jog, run, bicycle, lift weights, or participate in any other exercises unless specifically allowed by your doctor.  Talk to your doctor before resuming sexual activity.  You should not drive until cleared by your doctor.  Until released by your doctor, you should not return to work or school.  You should rest at home and let your body heal.   You may shower three days after your surgery.  After showering, lightly dab your incision dry. Do not take a tub bath or go swimming until approved by your doctor at your follow-up appointment.  If you smoke, we strongly recommend that you quit.  Smoking has been proven to interfere with normal bone healing and will dramatically reduce the success rate of your surgery. Please contact [facility] (800-QUIT-NOW) and use the resources at www.[facility].com for assistance in stopping smoking.  Surgical Incision   If you have a dressing on your incision, you may remove it two days after your surgery. Keep your incision area clean and dry.  Your  incisions are closed with dermabond glue. Please do not scrub incision sites.   Diet           You may return to your usual diet. Be sure to stay hydrated.  When to Contact us  You may experience pain in your neck . This is normal and should improve in the next few weeks with the help of pain medication, muscle relaxers, and rest. Some patients report that a warm compress on the back of the neck or between the shoulder blades helps as well as using ice.  However, should you experience any of the following, contact us immediately: New numbness or weakness Pain that is progressively getting worse, and is not relieved by your pain medication, muscle relaxers, rest, and warm compresses Bleeding, redness, swelling, pain, or drainage from surgical incision Chills or flu-like symptoms Fever greater than 101.0 F (38.3 C) Inability to eat, drink fluids, or take medications Problems with bowel or bladder functions Difficulty breathing or shortness of breath Warmth, tenderness, or swelling in your calf Contact Information How to contact us:  If you have any questions/concerns before or after surgery, you can reach [place] at [phone], or you can send a mychart message. We can be reached by phone or mychart 8am-4pm, Monday-Friday.  *Please note: Calls after 4pm are forwarded to a third party answering service. Mychart messages are not routinely monitored during evenings, weekends, and holidays. Please call our office to contact the answering service for urgent concerns during non-business hours.

## 2023-10-05 NOTE — Anesthesia Procedure Notes (Signed)
 Procedure Name: Intubation Date/Time: 10/05/2023 12:11 PM  Performed by: Monico Hoar, CRNAPre-anesthesia Checklist: Patient identified, Patient being monitored, Timeout performed, Emergency Drugs available and Suction available Patient Re-evaluated:Patient Re-evaluated prior to induction Oxygen Delivery Method: Circle system utilized Preoxygenation: Pre-oxygenation with 100% oxygen Induction Type: IV induction Ventilation: Mask ventilation without difficulty Laryngoscope Size: McGrath and 4 Grade View: Grade I Tube type: Oral Tube size: 6.0 mm Number of attempts: 1 Airway Equipment and Method: Stylet Placement Confirmation: ETT inserted through vocal cords under direct vision, positive ETCO2 and breath sounds checked- equal and bilateral Secured at: 21 cm Tube secured with: Tape Dental Injury: Teeth and Oropharynx as per pre-operative assessment

## 2023-10-05 NOTE — Telephone Encounter (Signed)
 Put paper in Dr. Loleta Chance office to be filled out

## 2023-10-05 NOTE — Transfer of Care (Signed)
 Immediate Anesthesia Transfer of Care Note  Patient: Brian [name]  Procedure(s) Performed: CERVICAL SPINAL CORD STIMULATOR PLACEMENT (Spine Cervical)  Patient Location: PACU  Anesthesia Type:General  Level of Consciousness: awake and alert   Airway & Oxygen Therapy: Patient Spontanous Breathing and Patient connected to face mask oxygen  Post-op Assessment: Report given to RN and Post -op Vital signs reviewed and stable  Post vital signs: Reviewed and stable  Last Vitals:  Vitals Value Taken Time  BP 134/79 10/05/23 1435  Temp 36.2 C 10/05/23 1435  Pulse 86 10/05/23 1442  Resp 19 10/05/23 1442  SpO2 99 % 10/05/23 1442  Vitals shown include unfiled device data.  Last Pain:  Vitals:   10/05/23 1031  TempSrc: Oral  PainSc: 0-No pain         Complications: No notable events documented.

## 2023-10-05 NOTE — Interval H&P Note (Signed)
 History and Physical Interval Note:  10/05/2023 11:42 AM  Brian [name]  has presented today for surgery, with the diagnosis of G54.0 Brachial plexopathy G90.511 Complex regional pain syndrome type 1 of right upper extremity G90.522 Complex regional pain syndrome type 1 of left lower extremity.  The various methods of treatment have been discussed with the patient and family. After consideration of risks, benefits and other options for treatment, the patient has consented to  Procedure(s): CERVICAL SPINAL CORD STIMULATOR PLACEMENT (N/A) as a surgical intervention.  The patient's history has been reviewed, patient examined, no change in status, stable for surgery.  I have reviewed the patient's chart and labs.  Questions were answered to the patient's satisfaction.    This patient was seen in the preoperative area.  His heart and lungs are clear.  He has had a progression of his disease and his pain continues to be uncontrollable.  He got a significant approximately 90% improvement with his stimulator trial.  We discussed moving forward with spinal cord stimulator with the oncology team right eye patient oncology team as well as the ENT team.  They all felt that it was indicated to continue to move forward.  Will plan on cervical stimulation placement today.  Lovenia Kim

## 2023-10-06 NOTE — Anesthesia Postprocedure Evaluation (Signed)
 Anesthesia Post Note  Patient: Brian [name]  Procedure(s) Performed: CERVICAL SPINAL CORD STIMULATOR PLACEMENT (Spine Cervical)  Patient location during evaluation: PACU Anesthesia Type: General Level of consciousness: awake and alert Pain management: pain level controlled Vital Signs Assessment: post-procedure vital signs reviewed and stable Respiratory status: spontaneous breathing, nonlabored ventilation, respiratory function stable and patient connected to nasal cannula oxygen Cardiovascular status: blood pressure returned to baseline and stable Postop Assessment: no apparent nausea or vomiting Anesthetic complications: no   No notable events documented.   Last Vitals:  Vitals:   10/05/23 1631 10/05/23 1640  BP: 116/75 111/73  Pulse: 86 84  Resp: 18 20  Temp:  (!) 36.3 C  SpO2: 95% 94%    Last Pain:  Vitals:   10/05/23 1640  TempSrc: Temporal  PainSc: 0-No pain                 Cleda Mccreedy Areeba Sulser

## 2023-10-07 ENCOUNTER — Telehealth: Payer: Self-pay | Admitting: Radiation Oncology

## 2023-10-07 NOTE — Telephone Encounter (Signed)
Called patient to schedule a FUN visit w. Alison. No answer, LVM for a return call.  

## 2023-10-08 ENCOUNTER — Encounter: Payer: Self-pay | Admitting: Neurosurgery

## 2023-10-08 ENCOUNTER — Telehealth: Payer: Self-pay | Admitting: Radiation Oncology

## 2023-10-08 NOTE — Telephone Encounter (Signed)
 Called patient's spouse to schedule patient for a FUN visit w. Jill Side. Spouse stated she would like to contact Dr. Gregary Cromer office prior to scheduling, will give our office a call back.

## 2023-10-11 ENCOUNTER — Other Ambulatory Visit: Payer: Self-pay | Admitting: Urology

## 2023-10-11 ENCOUNTER — Telehealth: Payer: Self-pay | Admitting: *Deleted

## 2023-10-11 ENCOUNTER — Inpatient Hospital Stay: Payer: BC Managed Care – PPO

## 2023-10-11 ENCOUNTER — Encounter (HOSPITAL_COMMUNITY): Payer: Self-pay | Admitting: Radiology

## 2023-10-11 DIAGNOSIS — C7931 Secondary malignant neoplasm of brain: Secondary | ICD-10-CM

## 2023-10-11 NOTE — Telephone Encounter (Signed)
 Called patient to inform of MRI for 10-12-23- arrival time- 11 am @ [facility], to do abdomen x-rays first, then MRI to be done, no restrictions to scan, patient to bring remote fully charged, lvm for a return call

## 2023-10-12 ENCOUNTER — Other Ambulatory Visit (HOSPITAL_COMMUNITY): Payer: BC Managed Care – PPO

## 2023-10-12 ENCOUNTER — Ambulatory Visit: Payer: BC Managed Care – PPO | Admitting: Radiation Oncology

## 2023-10-12 ENCOUNTER — Ambulatory Visit (HOSPITAL_COMMUNITY): Admission: RE | Admit: 2023-10-12 | Source: Ambulatory Visit

## 2023-10-12 ENCOUNTER — Telehealth: Payer: Self-pay | Admitting: Radiation Oncology

## 2023-10-12 ENCOUNTER — Telehealth: Payer: Self-pay | Admitting: *Deleted

## 2023-10-12 NOTE — Telephone Encounter (Signed)
 Called patient's spouse to schedule patient for a FUN visit w.  PA Jill Side. Spouse stated patient is not feeling well and has not decided if he would like to schedule a FUN visit. Spouse advised to call back early next week to see if patient would be willing to schedule at that time.

## 2023-10-12 NOTE — Telephone Encounter (Signed)
 CALLED PATIENT TO ASK WHETHER HE WILL BE WILLING TO RESCHEDULE MRI, LVM FOR A RETURN CALL

## 2023-10-13 ENCOUNTER — Inpatient Hospital Stay (HOSPITAL_BASED_OUTPATIENT_CLINIC_OR_DEPARTMENT_OTHER): Payer: BC Managed Care – PPO | Admitting: Internal Medicine

## 2023-10-13 ENCOUNTER — Other Ambulatory Visit (HOSPITAL_COMMUNITY): Payer: Self-pay

## 2023-10-13 ENCOUNTER — Inpatient Hospital Stay: Payer: BC Managed Care – PPO | Admitting: Internal Medicine

## 2023-10-13 ENCOUNTER — Telehealth: Payer: Self-pay | Admitting: Pharmacy Technician

## 2023-10-13 ENCOUNTER — Inpatient Hospital Stay: Payer: BC Managed Care – PPO | Attending: Internal Medicine

## 2023-10-13 ENCOUNTER — Telehealth: Payer: Self-pay | Admitting: Pharmacist

## 2023-10-13 ENCOUNTER — Inpatient Hospital Stay: Payer: BC Managed Care – PPO

## 2023-10-13 ENCOUNTER — Inpatient Hospital Stay

## 2023-10-13 VITALS — BP 125/85 | HR 91 | Temp 98.4°F | Resp 16 | Ht 68.0 in | Wt 160.7 lb

## 2023-10-13 DIAGNOSIS — C7931 Secondary malignant neoplasm of brain: Secondary | ICD-10-CM | POA: Diagnosis not present

## 2023-10-13 DIAGNOSIS — C3491 Malignant neoplasm of unspecified part of right bronchus or lung: Secondary | ICD-10-CM

## 2023-10-13 DIAGNOSIS — C3411 Malignant neoplasm of upper lobe, right bronchus or lung: Secondary | ICD-10-CM | POA: Insufficient documentation

## 2023-10-13 DIAGNOSIS — Z79899 Other long term (current) drug therapy: Secondary | ICD-10-CM | POA: Diagnosis not present

## 2023-10-13 DIAGNOSIS — Z923 Personal history of irradiation: Secondary | ICD-10-CM | POA: Diagnosis not present

## 2023-10-13 DIAGNOSIS — C770 Secondary and unspecified malignant neoplasm of lymph nodes of head, face and neck: Secondary | ICD-10-CM | POA: Diagnosis not present

## 2023-10-13 DIAGNOSIS — C3490 Malignant neoplasm of unspecified part of unspecified bronchus or lung: Secondary | ICD-10-CM | POA: Diagnosis not present

## 2023-10-13 DIAGNOSIS — Z9221 Personal history of antineoplastic chemotherapy: Secondary | ICD-10-CM | POA: Diagnosis not present

## 2023-10-13 LAB — CBC WITH DIFFERENTIAL (CANCER CENTER ONLY)
Abs Immature Granulocytes: 0.07 10*3/uL (ref 0.00–0.07)
Basophils Absolute: 0.2 10*3/uL — ABNORMAL HIGH (ref 0.0–0.1)
Basophils Relative: 1 %
Eosinophils Absolute: 0.5 10*3/uL (ref 0.0–0.5)
Eosinophils Relative: 3 %
HCT: 34 % — ABNORMAL LOW (ref 39.0–52.0)
Hemoglobin: 11.7 g/dL — ABNORMAL LOW (ref 13.0–17.0)
Immature Granulocytes: 1 %
Lymphocytes Relative: 11 %
Lymphs Abs: 1.7 10*3/uL (ref 0.7–4.0)
MCH: 29.5 pg (ref 26.0–34.0)
MCHC: 34.4 g/dL (ref 30.0–36.0)
MCV: 85.9 fL (ref 80.0–100.0)
Monocytes Absolute: 2 10*3/uL — ABNORMAL HIGH (ref 0.1–1.0)
Monocytes Relative: 13 %
Neutro Abs: 10.7 10*3/uL — ABNORMAL HIGH (ref 1.7–7.7)
Neutrophils Relative %: 71 %
Platelet Count: 365 10*3/uL (ref 150–400)
RBC: 3.96 MIL/uL — ABNORMAL LOW (ref 4.22–5.81)
RDW: 15.9 % — ABNORMAL HIGH (ref 11.5–15.5)
WBC Count: 15 10*3/uL — ABNORMAL HIGH (ref 4.0–10.5)
nRBC: 0 % (ref 0.0–0.2)

## 2023-10-13 LAB — CMP (CANCER CENTER ONLY)
ALT: 26 U/L (ref 0–44)
AST: 28 U/L (ref 15–41)
Albumin: 3.6 g/dL (ref 3.5–5.0)
Alkaline Phosphatase: 304 U/L — ABNORMAL HIGH (ref 38–126)
Anion gap: 6 (ref 5–15)
BUN: 14 mg/dL (ref 6–20)
CO2: 31 mmol/L (ref 22–32)
Calcium: 8.4 mg/dL — ABNORMAL LOW (ref 8.9–10.3)
Chloride: 102 mmol/L (ref 98–111)
Creatinine: 0.82 mg/dL (ref 0.61–1.24)
GFR, Estimated: >60 mL/min (ref >60)
Glucose, Bld: 151 mg/dL — ABNORMAL HIGH (ref 70–99)
Potassium: 3.4 mmol/L — ABNORMAL LOW (ref 3.5–5.1)
Sodium: 139 mmol/L (ref 135–145)
Total Bilirubin: 1 mg/dL (ref 0.0–1.2)
Total Protein: 6.2 g/dL — ABNORMAL LOW (ref 6.5–8.1)

## 2023-10-13 LAB — LIPID PANEL
Cholesterol: 139 mg/dL (ref 0–200)
HDL: 42 mg/dL (ref >40)
LDL Cholesterol: 80 mg/dL (ref 0–99)
Total CHOL/HDL Ratio: 3.3 ratio
Triglycerides: 85 mg/dL (ref <150)
VLDL: 17 mg/dL (ref 0–40)

## 2023-10-13 MED ORDER — LORLATINIB 100 MG PO TABS: 100.0000 mg | ORAL_TABLET | Freq: Every day | ORAL | 2 refills | Status: DC

## 2023-10-13 MED ORDER — MIRTAZAPINE 15 MG PO TABS: 15.0000 mg | ORAL_TABLET | Freq: Every day | ORAL | 2 refills | Status: DC

## 2023-10-13 NOTE — Progress Notes (Signed)
 [facility] Telephone:([phone]) [phone]   Fax:([phone]) [phone]  OFFICE PROGRESS NOTE  [name], MD [address] [place] [address]  DIAGNOSIS:  Metastatic non-small cell lung cancer initially diagnosed as stage IIIB (T1c, N3, M0) non-small cell lung cancer, adenocarcinoma presented with right upper lobe lung mass in addition to mediastinal and bilateral supraclavicular lymphadenopathy diagnosed in January 2020.  He had evidence of disease recurrence in June 2021 with adenopathy in the subcarinal, level 3 left neck lymph node, and lymph node at the curious of the diaphragm and in the upper abdomen.   PD-L1: 10%   Guardant 360 molecular studies showed no actionable mutation   Foundation One Testing:   Biomarker Findings Microsatellite status - MS-Stable Tumor Mutational Burden - 4 Muts/Mb Genomic Findings For a complete list of the genes assayed, please refer to the Appendix. ALK EML4-ALK fusion (Variant 2) SMARCB1 R377C CTNNB1 S45P CDKN2A/B CDKN2B loss, CDKN2A loss CXCR4 W106* TP53 splice site 672G>A 7 Disease relevant genes with no reportable alterations: BRAF, EGFR, ERBB2, KRAS, MET, RET, ROS1  PRIOR THERAPY: 1) Concurrent chemoradiation with weekly carboplatin for AUC of 2 and paclitaxel 45 mg/M2.  Status post 6 cycles.  Last dose was given on October 10, 2018 with stable disease. 2) Radiation treatment to the enlarging right cervical lymph nodes under the care of Dr. Roselind Messier. First treatment 03/06/2019. Last treatment scheduled on 04/13/2019 3) Consolidation treatment with immunotherapy with Imfinzi 10 mg/KG every 2 weeks.  First dose November 17, 2018.  Status post 26 cycles. 4) Systemic chemotherapy with carboplatin for an AUC of 5, Alimta 500 mg/m2, and Keytruda 200 mg IV every 3 weeks. First dose expected on 02/14/20. Status post 1 cycle.  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 5) SBRT to the left lower lobe lung nodule.   CURRENT THERAPY:  [name] ([name]) 600 mg p.o. twice daily.  He started the first dose on March 08, 2020.   Status post 41 months of treatment.  INTERVAL HISTORY: Brian [name] 63 y.o. male turns to the clinic today for follow-up visit accompanied by his wife.Discussed the use of AI scribe software for clinical note transcription with the patient, who gave verbal consent to proceed.  History of Present Illness   The patient is a 63 year old with metastatic non-small cell lung cancer adenocarcinoma who presents for follow-up of disease progression and treatment response. He is accompanied by his wife.  He has a history of metastatic non-small cell lung cancer adenocarcinoma, initially diagnosed as stage III B in January 2020. He received concurrent chemoradiation and experienced a recurrence in the lymph node in the right neck area in June 2021. He has been on alectinib 600 mg twice daily for 43 months.  Two months ago, a CT scan revealed right pleural fluid but no spread to the neck. However, lymph nodes were noted, prompting further investigation. A CT scan of the neck showed small lymph nodes and a larger one in the same area treated in 2020, which has started growing back to its previous size. He is concerned about the impact of radiation on his current condition, noting that previous radiation in 2021 may have contributed to his current issues of right arm weakness.  He has recent hoarseness of voice and weakness in the right arm, now also affecting the left arm. The nerve stimulator has not been  effective yet in alleviating these symptoms. He has undergone x-rays of the neck area, but the results were inconclusive. He mentions difficulty lying flat for an MRI due to breathing issues.  He is not currently taking any antidepressants but is open to starting one to manage potential mood swings associated with treatment. He has a  supply of alectinib at home.        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  MEDICATIONS:  Current Outpatient Medications  Medication Sig Dispense Refill   acetaminophen (TYLENOL) 500 MG tablet Take 1,000 mg by mouth every 6 (six) hours as needed (pain.).     albuterol (VENTOLIN HFA) 108 (90 Base) MCG/ACT inhaler INHALE 2 PUFFS INTO THE LUNGS EVERY 4 (FOUR) HOURS AS NEEDED FOR WHEEZING OR SHORTNESS OF BREATH. 6.7 each 4   [name] 150 MG capsule TAKE 4 CAPSULES BY MOUTH TWICE DAILY WITH FOOD. SWALLOW CAPSULES WHOLE. DO NOT OPEN OR CRUSH. STORE IN ORIGINAL CONTAINER 240 capsule 2   amitriptyline (ELAVIL) 25 MG tablet Take 3 tablets (75 mg total) by mouth at bedtime. 90 tablet 0   amLODipine (NORVASC) 5 MG tablet Take 5 mg by mouth in the morning.     atorvastatin (LIPITOR) 20 MG tablet Take 20 mg by mouth in the morning.     chlorhexidine (HIBICLENS) 4 % external liquid Apply 15 mLs (1 Application total) topically as directed for 30 doses. Use as directed daily for 5 days every other week for 6 weeks. 946 mL 1   cyclobenzaprine (FLEXERIL) 5 MG tablet Take 1 tablet (5 mg total) by mouth 3 (three) times daily as needed for muscle spasms. 30 tablet 0   docusate sodium (COLACE) 100 MG capsule Take 1 capsule (100 mg total) by  mouth 2 (two) times daily. 10 capsule 0   DULoxetine (CYMBALTA) 60 MG capsule Take 1 capsule (60 mg total) by mouth daily. 90 capsule 3   gabapentin (NEURONTIN) 600 MG tablet Take 1 tablet (600 mg) midday and take 4 tablets (2400 mg) at bedtime (Patient taking differently: Take 1,200 mg by mouth at bedtime.) 120 tablet 5   levothyroxine (SYNTHROID) 100 MCG tablet Take 100 mcg by mouth daily before breakfast.     mupirocin ointment (BACTROBAN) 2 % Place 1 Application into the nose 2 (two) times daily for 60 doses. Use as directed 2 times daily for 5 days every other week for 6 weeks. 60 g 0   oxyCODONE (ROXICODONE) 5 MG immediate release tablet Take 1 tablet (5 mg total) by mouth every 4 (four) hours as needed for severe pain (pain score 7-10). 30 tablet 0   PREDNISONE PO Take by mouth. Unsure of dosage; ordered by Dr. Andee Poles for hoarseness for 6 days  senna (SENOKOT) 8.6 MG TABS tablet Take 1 tablet (8.6 mg total) by mouth daily. 120 tablet 0   TRELEGY ELLIPTA 100-62.5-25 MCG/INH AEPB Inhale 1 puff into the lungs in the morning.     venlafaxine (EFFEXOR) 37.5 MG tablet Take 37.5 mg by mouth in the morning.     No current facility-administered medications for this visit.    SURGICAL HISTORY:  Past Surgical History:  Procedure Laterality Date   COLONOSCOPY     FEMUR FRACTURE SURGERY Left 1969   INGUINAL HERNIA REPAIR Right 06/24/2020   Procedure: RIGHT OPEN INGUINAL HERNIA REPAIR WITH MESH;  Surgeon: Kinsinger, De Blanch, MD;  Location: [facility];  Service: General;  Laterality: Right;   IR IVC FILTER PLMT / S&I Lenise Arena GUID/MOD SED  03/04/2020   IR IVC FILTER RETRIEVAL / S&I Lenise Arena GUID/MOD SED  08/15/2020   IR RADIOLOGIST EVAL & MGMT  08/06/2020   SPINAL CORD STIMULATOR INSERTION N/A 10/05/2023   Procedure: CERVICAL SPINAL CORD STIMULATOR PLACEMENT;  Surgeon: Lovenia Kim, MD;  Location: [facility] ORS;  Service: Neurosurgery;  Laterality: N/A;    REVIEW OF SYSTEMS:  Constitutional: positive  for fatigue Eyes: negative Ears, nose, mouth, throat, and face: negative Respiratory: negative Cardiovascular: negative Gastrointestinal: negative Genitourinary:negative Integument/breast: negative Hematologic/lymphatic: negative Musculoskeletal:positive for arthralgias, muscle weakness, and neck pain Neurological: positive for paresthesia and weakness Behavioral/Psych: negative Endocrine: negative Allergic/Immunologic: negative   PHYSICAL EXAMINATION: General appearance: alert, cooperative, fatigued, and no distress Head: Normocephalic, without obvious abnormality, atraumatic Neck: no JVD, supple, symmetrical, trachea midline, and thyroid: enlarged Lymph nodes: Cervical, supraclavicular, and axillary nodes normal. Resp: clear to auscultation bilaterally Back: symmetric, no curvature. ROM normal. No CVA tenderness. Cardio: regular rate and rhythm, S1, S2 normal, no murmur, click, rub or gallop GI: soft, non-tender; bowel sounds normal; no masses,  no organomegaly Extremities: extremities normal, atraumatic, no cyanosis or edema Neurologic: Alert and oriented X 3, normal strength and tone. Normal symmetric reflexes. Normal coordination and gait  ECOG PERFORMANCE STATUS: 1 - Symptomatic but completely ambulatory  Blood pressure 125/85, pulse 91, temperature 98.4 F (36.9 C), resp. rate 16, height 5\' 8"  (1.727 m), weight 160 lb 11.2 oz (72.9 kg), SpO2 97%.  LABORATORY DATA: Lab Results  Component Value Date   WBC 12.8 (H) 08/12/2023   HGB 11.7 (L) 08/12/2023   HCT 34.0 (L) 08/12/2023   MCV 89.9 08/12/2023   PLT 352 08/12/2023      Chemistry      Component Value Date/Time   NA 140 08/12/2023 1618   K 4.0 08/12/2023 1618   CL 104 08/12/2023 1618   CO2 30 08/12/2023 1618   BUN 19 08/12/2023 1618   CREATININE 0.98 08/12/2023 1618      Component Value Date/Time   CALCIUM 9.0 08/12/2023 1618   ALKPHOS 197 (H) 08/12/2023 1618   AST 20 08/12/2023 1618   ALT 18 08/12/2023  1618   BILITOT 0.7 08/12/2023 1618       RADIOGRAPHIC STUDIES: DG Cervical Spine 2-3 Views Result Date: 10/05/2023 CLINICAL DATA:  Elective surgery.  Cervical spine stimulator. EXAM: CERVICAL SPINE - 2-3 VIEW COMPARISON:  None Available. FINDINGS: Two fluoroscopic spot views of the cervical spine submitted from the operating room. Spinal stimulator with lead tip posterior at the C3-C4 level. Fluoroscopy time 2 seconds. Dose 0.35 mGy IMPRESSION: Intraoperative fluoroscopy during cervical spine stimulator placement. Electronically Signed   By: Narda Rutherford M.D.   On: 10/05/2023 16:12   DG C-Arm 1-60 Min-No Report Result  Date: 10/05/2023 Fluoroscopy was utilized by the requesting physician.  No radiographic interpretation.   DG C-Arm 1-60 Min-No Report Result Date: 10/05/2023 Fluoroscopy was utilized by the requesting physician.  No radiographic interpretation.   CT SOFT TISSUE NECK W CONTRAST Result Date: 09/30/2023 CLINICAL DATA:  Right vocal cord paresis.  History of lung cancer. EXAM: CT NECK WITH CONTRAST TECHNIQUE: Multidetector CT imaging of the neck was performed using the standard protocol following the bolus administration of intravenous contrast. RADIATION DOSE REDUCTION: This exam was performed according to the departmental dose-optimization program which includes automated exposure control, adjustment of the mA and/or kV according to patient size and/or use of iterative reconstruction technique. CONTRAST:  75mL OMNIPAQUE IOHEXOL 350 MG/ML SOLN COMPARISON:  Neck CT 11/06/2022 FINDINGS: Pharynx and larynx: New findings of right vocal cord paresis including medial positioning of the right vocal cord and asymmetric enlargement of the right laryngeal ventricle and piriform sinus. No mass. Salivary glands: No inflammation, mass, or stone. Thyroid: Diffusely small and otherwise unremarkable. Lymph nodes: Multiple new abnormally rounded and heterogeneously hypodense lymph nodes in the left neck  including level II nodes measuring up to 10 mm in short axis, level III nodes measuring up to 11 mm, and level V nodes measuring up to 11 mm. New ill-defined, masslike soft tissue in the right lower neck measuring approximately 4 x 3 cm located in level IV and extending into the superior mediastinum encasing the right subclavian and right vertebral arteries and involving the lower scalene musculature and brachial plexus (for example images 114 and 119 of series 4). Vascular: Mild narrowing of the right subclavian artery where it is encased by the above described soft tissue. Major vascular structures of the neck appear patent. Limited intracranial: Unremarkable. Visualized orbits: Unremarkable. Mastoids and visualized paranasal sinuses: Extensive mucosal thickening in the right maxillary sinus. Right anterior ethmoid air cell opacification. Clear mastoid air cells. Skeleton: Mild cervical spondylosis.  No suspicious lesion. Upper chest: Partially visualized right pleural effusion which is partially loculated. Other: None. IMPRESSION: 1. Evidence of metastatic disease in the neck including multiple pathologic left-sided cervical lymph nodes as well as a 4 cm region of ill-defined masslike soft tissue extending from right level IV into the upper chest with encasement of the right subclavian artery and likely brachial plexus involvement. 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 2. Partially visualized right pleural effusion. Electronically Signed   By: Sebastian Ache M.D.   On: 09/30/2023 17:44     ASSESSMENT AND PLAN: This is a very pleasant 63 years old white male with metastatic non-small cell lung cancer, adenocarcinoma now with ALK gene translocation.  The patient was initially diagnosed as a stage IIIa non-small cell lung cancer status post a course of concurrent chemoradiation with weekly carboplatin and paclitaxel followed by 1 year of consolidation treatment with immunotherapy with  Imfinzi. He had evidence for disease progression recently. Repeat tissue biopsy from the left supraclavicular lymph nodes and molecular studies showed that the patient has positive ALK gene translocation.  His previous molecular studies by Guardant 360 was negative. He received 1 cycle of systemic chemotherapy with carboplatin, Alimta and Keytruda.  We will discontinue his systemic chemotherapy for now because of the new findings on the molecular studies. The patient has been on treatment with [name] 600 mg p.o. twice daily.  He is status post 43 months of treatment.   The patient had CT scan of the neck performed recently that showed evidence of  metastatic disease in the neck including multiple pathologic left-sided cervical lymph nodes as well as a 4 cm region of ill-defined masslike soft tissue extending from right level 4 to the upper chest with encasement of the right subclavian artery and likely brachial plexus involvement.  The latter is likely the etiology of the right focal cord paresis.  I personally independently reviewed the scan images and discussed the result and showed the images to the patient and his wife.     Metastatic non-small cell lung cancer (NSCLC) adenocarcinoma Initially diagnosed as stage IIIB in January 2020, treated with concurrent chemoradiation. Recurrence in cervical lymph node in June 2021. On alectinib 600 mg twice daily for 43 months. Recent CT shows lymph node enlargement in previously treated area. Symptoms include hoarseness and right arm weakness, likely due to lymph node involvement. PET scan required to assess further metastasis and guide treatment. Considering switch to lorlatinib due to its efficacy in ALK gene translocation and potential to overcome alectinib resistance. Discussed lorlatinib side effects: weight gain, mood swings, increased cholesterol. Radiation therapy considered post-PET scan to target specific areas if indicated, acknowledging previous  radiation may have contributed to current issues. - Order PET scan to assess for metastasis and guide treatment. - Switch to lorlatinib 100 mg once daily. - Monitor for lorlatinib side effects: weight gain, mood swings, increased cholesterol. - Check baseline cholesterol and monitor regularly. - Refer to Dr. Roselind Messier for radiation therapy consultation post-PET scan.  Cervical lymphadenopathy Enlarged cervical lymph node, previously treated with radiation in 2020, possibly contributing to hoarseness and right arm weakness. Radiation therapy considered post-PET scan to target specific areas if indicated. Consulted with ENT Dr. Hoy Morn, who suggested radiation therapy. - Refer to Dr. Roselind Messier for radiation therapy consultation post-PET scan.  Mood swings Potential side effect of lorlatinib. Discussed starting an antidepressant for management. Not currently on antidepressants. - Start Remeron for mood stabilization.  Cervical spinal cord stimulator placement Implanted on March 4th by Dr. Katrinka Blazing for nerve-related symptoms. Education on device control scheduled for next week. - Educate on controlling the cervical spinal cord stimulator.  Follow-up Follow-up planned to assess response to new treatment and manage side effects. - Schedule follow-up in 2-3 weeks to assess response to lorlatinib and manage side effects.   The patient was advised to call immediately if he has any other concerning symptoms in the interval.  The patient voices understanding of current disease status and treatment options and is in agreement with the current care plan.  All questions were answered. The patient knows to call the clinic with any problems, questions or concerns. We can certainly see the patient much sooner if necessary.  Disclaimer: This note was dictated with voice recognition software. Similar sounding words can inadvertently be transcribed and may not be corrected upon review.

## 2023-10-13 NOTE — Telephone Encounter (Signed)
 Oral Oncology Patient Advocate Encounter   Received notification that prior authorization for Brian [name] is required.   PA submitted on 10/13/23 Key [id number] Status is pending     Omer Jack, CPhT-Adv Oncology Pharmacy Patient Advocate [facility]  Direct Number: [phone]  Fax: [phone]

## 2023-10-13 NOTE — Progress Notes (Signed)
 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  HPI: CLEMENT DENEAULT is a 63 y.o. year old male with a history of diabetes, hypothyroidism, lung cancer s/p chemo and radiation now with mets in neck, emphysema, SAH, and HLD who we last saw on 06/17/23.  To briefly review: 03/17/23: Patient has pain starting at the base of the neck that radiates mostly into the right arm. He has no use in his 4th and 5th digits. He has a lot of pain in the neck, shoulder, into the elbow, and into the arm. The pain is very severe and worse at night. In the left arm patient has some numbness and pain, most in the forearm. He feels like it is a bad sunburn. He has muscle atrophy and is getting progressively worse. Patient thinks symptoms started in 06/2022 and were mild. Symptoms became severe in 09/2022. The pain is so bad currently that he sleeps only a couple hours per night.   He denies similar symptoms in the past.   He endorses muscle cramps in right triceps mainly. He endorses some fasciculations in his arms. He denies significant low back pain or symptoms in his legs.   The patient denies symptoms suggestive of oculobulbar weakness including diplopia, ptosis, dysphagia, poor saliva control, dysarthria/dysphonia, impaired mastication, facial weakness/droop.   There are no neuromuscular respiratory weakness symptoms, particularly orthopnea>dyspnea.    Pseudobulbar affect is absent.   He does not report any constitutional symptoms like fever, night sweats, anorexia or unintentional weight loss. He does report 10 lbs of weight loss but this is intentional.   EtOH use: 2-3 beers a month  Restrictive diet? No Family history of neuropathy/myopathy/neurologic disease? No   Patient has seen spine and maybe saw neurology in [facility]. He has also been seen at the hand center. He saw PM&R at [facility] and  had EMG on 02/12/23 and 02/17/23 that showed right brachial plexopathy (per notes: upper trunk brachial pan plexopathy in the right upper extremity predominantly in the lower trunk distribution). Patient states that he was told he had carpal tunnel and ulnar neuropathy, which is what the LUE report mentioned.   Patient has been prescribed gabapentin 1200 mg at night (sometimes will take 1800 mg). He is not sure it is working. He denies any side effects. He also has baclofen 10 mg TID, but does not really take this. His pain is so bad though, he does not have good relief. He also uses ice and heat. He also had a steroid shot and Toradol that only helped about 2 hours.   Oncology history per Dr. Asa Lente oncology clinic note (12/07/22):   Metastatic non-small cell lung cancer initially diagnosed as stage IIIB (T1c, N3, M0) non-small cell lung cancer, adenocarcinoma presented with right upper lobe lung mass in addition to mediastinal and bilateral supraclavicular lymphadenopathy diagnosed in January 2020.  He had evidence of disease recurrence in June 2021 with adenopathy in the subcarinal, level 3 left neck lymph node, and lymph node at the curious of the diaphragm and in the upper abdomen.    PRIOR THERAPY: 1) Concurrent chemoradiation with weekly carboplatin for AUC of 2 and paclitaxel 45 mg/M2.  Status post 6 cycles.  Last dose was given on October 10, 2018 with stable disease. 2) Radiation treatment to the enlarging right cervical lymph nodes under the care of Dr. Roselind Messier. First treatment  03/06/2019. Last treatment scheduled on 04/13/2019 3) Consolidation treatment with immunotherapy with Imfinzi 10 mg/KG every 2 weeks.  First dose November 17, 2018.  Status post 26 cycles. 4) Systemic chemotherapy with carboplatin for an AUC of 5, Alimta 500 mg/m2, and Keytruda 200 mg IV every 3 weeks. First dose expected on 02/14/20. Status post 1 cycle.  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 5) SBRT to the left lower lobe lung nodule.   CURRENT THERAPY:  [name] ([name]) 600 mg p.o. twice daily.  He started the first dose on March 08, 2020.   Status post 33 months of treatment.   Per patient, he is currently in remission.   04/21/23: EMG on 03/30/23 did show evidence of bilateral brachial plexopathy with myokymia as would be seen in radiation plexopathy. Patient continued to have significant pain, especially at night. I increased his gabapentin to 600 mg midday and 2400 mg in the evening while continuing Cymbalta 60 mg daily (03/30/23).   Patient's pain has progressively gotten worse. He has a lot of swelling in his hand and feels like it is going to burst. He tried to elevated it last night. The swelling reduced when he did this. He thinks movement and use of arm are still bad but are unchanged. Patient is no longer taking the gabapentin 600 mg during the day, as it did not do much. He continues to take gabapentin 2400 mg in the evening and Cymbalta 60 mg daily.    MRI of brachial plexus on 04/02/23 which was normal of bilateral brachial plexus. Mild teninosis of right supraspinatus tendon was mentioned.   Patient saw Dr. Katrinka Blazing in NSGY on 04/07/23. Patient was referred for scalene block and evaluation of peripheral nerve stimulator. He will be evaluated on 05/04/23.   Patient has started therapy (last week). He has this scheduled through 07/2023.  06/17/23: Patient saw Dr. Cherylann Ratel in pain management on 05/04/23. In addition to radiation plexopathy, Dr. Cherylann Ratel also diagnosed complex regional pain syndrome. He recommended decreasing gabapentin to 1200 mg nightly and starting amitriptyline 50 mg at bedtime. He also continued Cymbalta, warning of signs of serotonin syndrome. A diagnostic scalene block was done on 05/12/23. This helped with pain for a little bit. He followed up again on 06/09/23. The plan is to do a right stellate ganglion  nerve block later this month. He is sleeping much better, now 6-7 hours at night.   He has lost function in right arm at times, but at other times feels it has increased range of motion. Overall, he thinks his arm is weaker.   He has tingling in his left arm (4th and 5th digits), but overall feels this arm is similar to prior.   He has no new complaints today.  Most recent Assessment and Plan (06/17/23): This is JEMIAH ELLENBURG, a 63 y.o. male with bilateral upper extremity pain and weakness, right much worse than left. EMG was consistent with a diffuse right brachial plexopathy with myokymic discharges as can be seen in radiation plexopathy (seen in right FPL, biceps, and infraspinatus). His LUE showed abnormal median, ulnar, and radial sensory responses with chronic neurogenic changes seen in the left FDI, FPL (with active changes), and APB, suggesting a possible lower trunk predominant process vs C8 and T1 radiculopathy. The etiology of symptoms of the left side is less clear though. Overall, symptoms in LUE are stable. His RUE is  much weaker today than previous with little to no movement. While this may be effort or pain limited, he seems to be trying and giving as good an exam as he can. His right hand continues to be very swollen, perhaps with dependent edema. This is a very complex case. He is seeing Dr. Katrinka Blazing in NSGY and Dr. Cherylann Ratel in pain management. He is getting nerve blocks currently for right radiation plexopathy and complex regional pain syndrome, but does not appear to be improving. Patient is very frustrated and is hoping for more aggressive care. On a positive note, he is sleeping better.   Plan: -I will discuss my findings and concerns with Dr. Katrinka Blazing and Dr. Cherylann Ratel. I could repeat EMG if they feel this will be helpful. I do not want to cause patient more patient as he cannot even really tolerate a sensory exam though, if this will not be helpful to the case. -Continue pain meds per  Dr. Cherylann Ratel.             -amitriptyline 75 mg at bedtime              -gabapentin 1200 mg at bedtime             -Cymbalta 60 mg daily  Since their last visit: PT messaged me regarding patient continuing to get weaker and now having voice changes. There was concern for progression of radiation plexopathy but also radiation damage to pharyngeal nerves.   Patient followed up with oncology, last on 10/13/23. Per that note by Dr. Arbutus Ped: The patient had CT scan of the neck performed recently that showed evidence of metastatic disease in the neck including multiple pathologic left-sided cervical lymph nodes as well as a 4 cm region of ill-defined masslike soft tissue extending from right level 4 to the upper chest with encasement of the right subclavian artery and likely brachial plexus involvement. The latter is likely the etiology of the right focal cord paresis.   Patient is meeting with oncology next week to discuss treatment options.  His pain has improved significantly with the spinal cord stimulator. He finds he has neck pain likely due to weakness of the muscles. He is still on amitriptyline 75 mg daily and Cymbalta 60 mg daily. He stopped gabapentin and is not using oxycodone.  Patient has had a couple of falls related to lightheadedness. He denies any leg weakness.   MEDICATIONS:  Outpatient Encounter Medications as of 10/21/2023  Medication Sig   acetaminophen (TYLENOL) 500 MG tablet Take 1,000 mg by mouth every 6 (six) hours as needed (pain.).   albuterol (VENTOLIN HFA) 108 (90 Base) MCG/ACT inhaler INHALE 2 PUFFS INTO THE LUNGS EVERY 4 (FOUR) HOURS AS NEEDED FOR WHEEZING OR SHORTNESS OF BREATH.   amitriptyline (ELAVIL) 25 MG tablet Take 3 tablets (75 mg total) by mouth at bedtime.   amLODipine (NORVASC) 5 MG tablet Take 5 mg by mouth in the morning.   baclofen (LIORESAL) 10 MG tablet Take 10 mg by mouth daily.   DULoxetine (CYMBALTA) 60 MG capsule Take 1 capsule (60 mg total) by mouth  daily.   levothyroxine (SYNTHROID) 100 MCG tablet Take 100 mcg by mouth daily before breakfast.   lorlatinib ([name]) 100 MG tablet Take 1 tablet (100 mg total) by mouth daily. Swallow tablets whole. Do not chew, crush or split tablets.   TRELEGY ELLIPTA 100-62.5-25 MCG/INH AEPB Inhale 1 puff into the lungs in the morning.   venlafaxine (EFFEXOR) 37.5 MG tablet Take 37.5  mg by mouth in the morning.   atorvastatin (LIPITOR) 20 MG tablet Take 20 mg by mouth in the morning. (Patient not taking: Reported on 10/21/2023)   cyclobenzaprine (FLEXERIL) 5 MG tablet Take 1 tablet (5 mg total) by mouth 3 (three) times daily as needed for muscle spasms. (Patient not taking: Reported on 10/21/2023)   mirtazapine (REMERON) 15 MG tablet Take 1 tablet (15 mg total) by mouth at bedtime. (Patient not taking: Reported on 10/21/2023)   mupirocin ointment (BACTROBAN) 2 % Place 1 Application into the nose 2 (two) times daily for 60 doses. Use as directed 2 times daily for 5 days every other week for 6 weeks. (Patient not taking: Reported on 10/21/2023)   oxyCODONE (ROXICODONE) 5 MG immediate release tablet Take 1 tablet (5 mg total) by mouth every 4 (four) hours as needed for severe pain (pain score 7-10). (Patient not taking: Reported on 10/21/2023)   PREDNISONE PO Take by mouth. Unsure of dosage; ordered by Dr. Andee Poles for hoarseness for 6 days (Patient not taking: Reported on 10/21/2023)   [DISCONTINUED] chlorhexidine (HIBICLENS) 4 % external liquid Apply 15 mLs (1 Application total) topically as directed for 30 doses. Use as directed daily for 5 days every other week for 6 weeks.   [DISCONTINUED] cyclobenzaprine (FLEXERIL) 5 MG tablet Take 1 tablet (5 mg total) by mouth 3 (three) times daily as needed for muscle spasms.   [DISCONTINUED] docusate sodium (COLACE) 100 MG capsule Take 1 capsule (100 mg total) by mouth 2 (two) times daily.   [DISCONTINUED] gabapentin (NEURONTIN) 600 MG tablet Take 1 tablet (600 mg) midday and take  4 tablets (2400 mg) at bedtime (Patient taking differently: Take 1,200 mg by mouth at bedtime.)   [DISCONTINUED] lorlatinib ([name]) 100 MG tablet Take 1 tablet (100 mg total) by mouth daily. Swallow tablets whole. Do not chew, crush or split tablets.   [DISCONTINUED] senna (SENOKOT) 8.6 MG TABS tablet Take 1 tablet (8.6 mg total) by mouth daily.   No facility-administered encounter medications on file as of 10/21/2023.    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    Non-small cell cancer of right lung (HCC) 08/23/2018   Pneumonia    Pre-diabetes    Radiation-induced brachial plexopathy    Recurrent right pleural effusion    Secondary malignant neoplasm of brain and spinal cord (HCC)    Subarachnoid hemorrhage (HCC) 2016   Voice hoarseness     PAST SURGICAL HISTORY: Past Surgical History:  Procedure Laterality Date   COLONOSCOPY     FEMUR FRACTURE SURGERY Left 1969   INGUINAL HERNIA REPAIR Right 06/24/2020   Procedure: RIGHT OPEN INGUINAL HERNIA REPAIR WITH MESH;  Surgeon: Kinsinger, De Blanch, MD;  Location: [facility] ORS;  Service: General;  Laterality: Right;   IR IVC FILTER PLMT / S&I Lenise Arena GUID/MOD SED  03/04/2020   IR IVC FILTER RETRIEVAL / S&I Lenise Arena GUID/MOD SED  08/15/2020   IR RADIOLOGIST EVAL & MGMT  08/06/2020   SPINAL CORD STIMULATOR INSERTION N/A 10/05/2023   Procedure: CERVICAL SPINAL CORD STIMULATOR PLACEMENT;  Surgeon: Lovenia Kim, MD;  Location: [facility]  ORS;  Service: Neurosurgery;  Laterality: N/A;    ALLERGIES: No Known Allergies  FAMILY HISTORY: Family History  Problem Relation Age of Onset   Diabetes Mother     Diabetes Brother    Colon cancer Neg Hx    Stomach cancer Neg Hx    Thyroid cancer Neg Hx     SOCIAL HISTORY: Social History   Tobacco Use   Smoking status: Former    Types: Cigarettes   Smokeless tobacco: Never  Vaping Use   Vaping status: Never Used  Substance Use Topics   Alcohol use: Yes    Alcohol/week: 5.0 standard drinks of alcohol    Types: 5 Standard drinks or equivalent per week    Comment: occasionally   Drug use: No   Social History   Social History Narrative   Are you right handed or left handed? Right   Are you currently employed ?    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   Who lives with you? wife   What type of home do you live in: 1 story or 2 story? Two    Caffeine  3 cups a day     Objective:  Vital Signs:  BP 109/76   Pulse 97   Ht 5\' 7"  (1.702 m)   Wt 154 lb (69.9 kg)   SpO2 97%   BMI 24.12 kg/m   General: General appearance: Awake and alert. No distress. Cooperative with exam.  Skin: No obvious rash or jaundice. HEENT: Atraumatic. Anicteric. Lungs: Non-labored breathing on room air  Extremities: No edema.  Neurological: Mental Status: Alert. Speech fluent. No pseudobulbar affect Cranial Nerves: CNII: No RAPD. Visual fields intact. CNIII, IV, VI: PERRL. No nystagmus. EOMI. CN V: Facial sensation intact bilaterally to fine touch. CN VII: Facial muscles symmetric and strong. No ptosis at rest. CN VIII: Hears finger rub well bilaterally. CN IX: No hypophonia. CN X: Palate elevates symmetrically. CN XI: Full strength shoulder shrug bilaterally. CN XII: Tongue protrusion full and midline. No atrophy or fasciculations. No significant dysarthria. Hoarse voice. Motor: Tone is normal in lower extremities, flaccid in RUE, normal in LUE. Atrophy of RUE and hand in LUE.  Individual muscle group testing (MRC grade out of 5):  Movement     Neck flexion 5    Neck extension 5     Right Left   Shoulder  abduction 0 5   Shoulder adduction 0 5   Shoulder ext rotation 0 5   Shoulder int rotation 0 5   Elbow flexion 0 5   Elbow extension 0 4-   Finger abduction - FDI 0 0   Finger abduction - ADM 0 0   Finger extension 0 0   Finger distal flexion - 2/3 0 0   Finger distal flexion - 4/5 0 0   Thumb flexion - FPL 0 0   Thumb abduction - APB 0 0    Hip flexion 5 5   Hip extension 5 5   Hip adduction 5 5   Hip abduction 5 5   Knee extension 5 5   Knee flexion 5 5   Dorsiflexion 5 5   Plantarflexion 5 5    Reflexes:  Right Left  Bicep 0 2+  Tricep 0 2+  BrRad 0 2+  Knee 2+ 2+  Ankle 2+ 2+   Sensation: Absent temperature in RUE, otherwise intact Gait: Able to rise from chair with arms crossed unassisted.  Normal, narrow-based gait.   Lab and Test Review: New results: 10/13/23: CBC w/ diff significant for WBC 15.0 (neutrophilic predominance), Hb 11.7 CMP significant for glucose 151, alk phos 304  CT soft tissue neck w/ contrast (09/27/23): IMPRESSION: 1. Evidence of metastatic disease in the neck including multiple pathologic left-sided cervical lymph nodes as well as a 4 cm region of ill-defined masslike soft tissue extending from right level IV into the upper chest with encasement of the right subclavian artery and likely brachial plexus involvement. 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 2. Partially visualized right pleural effusion.  Previously reviewed results: B6 (03/17/23) wnl   TSH (11/06/22): elevated to 6.447   External labs: 01/27/23: CMP significant for mildly elevated glucose (112), Tbili (1.1), and Alk phos (124) TSH wnl HbA1c: 5.8 Lipid panel: total cholesterol 151, TG 75, LDL 62   B12 (10/20/21): 921   Imaging: External EMG (02/12/23 - cannot see table only this note): Sherwin Hollingshed is a 63 y.o. year old male with BUE pain and paresthesias (R > L) referred for electrodiagnostics to evaluate for peripheral nerve entrapment  versus brachial plexopathy versus cervical radiculopathy. Patient does have a history of lung cancer s/p chemotherapy and radiation to the right upper lung field and right neck region. He also has a history of T2DM. Due to the severity of the findings, testing was limited to the right upper extremity today. He will come back next week for left upper extremity testing. 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 There is evidence of acute denervation in all right upper extremity muscles tested. This is consistent with the patient's clinical history of radiation. Due to the above plexopathy and diffuse findings in the right upper extremity on EMG, cannot rule out a more diffuse process such as motor neuron disease. Will bring patient back in to test for the left upper extremity next week. While the median nerve conduction studies were abnormal, this may be attributed to the more proximal plexopathy, but cannot definitively rule out a concomitant median neuropathy.    External EMG (02/17/23 - cannot see table only this note): Chinonso Linker is a 63 y.o. year old male referred for electrodiagnostics, findings consistent with: Acute on chronic left C8 radiculopathy. Severe left-sided carpal tunnel syndrome. Left-sided cubital tunnel syndrome.    MRI brain w/wo contrast (10/15/22): FINDINGS: Brain:   No age advanced or lobar predominant parenchymal atrophy.   Multifocal T2 FLAIR hyperintense signal abnormality within the cerebral white matter, nonspecific but compatible with mild chronic small vessel ischemic disease.   Small chronic infarct within the right cerebellar hemisphere   There is no acute infarct.   No evidence of an intracranial mass.   No chronic intracranial blood products.   No extra-axial fluid collection.   No midline  shift.   No pathologic intracranial enhancement identified.   Vascular: Maintained flow voids within the proximal large arterial vessels.   Skull and upper cervical spine: No focal suspicious marrow lesion.   Sinuses/Orbits: No mass or acute finding within the imaged orbits. Partial T2 hyperintense opacification of the right frontal sinus and of an anterior right ethmoid air cell. Mild mucosal thickening scattered elsewhere within bilateral ethmoid air cells. Mild mucosal thickening within the bilateral maxillary sinuses.   IMPRESSION: 1. No evidence of intracranial metastatic disease. 2. Mild chronic small vessel  ischemic changes within the cerebral white matter, similar to the prior brain MRI of 10/09/2021. 3. 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 4. Paranasal sinus disease, as described   MRI right brachial plexus (07/30/22): FINDINGS: Spinal cord   Visualized spinal cord demonstrates normal signal.   Brachial plexus:   Appears normal. No mass impinging on the brachial plexus is identified. No neurogenic tumor is identified.   Muscles and tendons   Normal signal throughout without evidence of denervation atrophy.   Bones   No fracture or worrisome lesion is seen. Small degenerative cyst in the right humeral head is noted.   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 The glenohumeral joint appears normal.   Other findings   Moderately large right pleural effusion appears increased in size compared to the prior chest CT.   IMPRESSION: Normal appearing right brachial plexus.   Moderately large right pleural effusion appears increased in size compared to the patient's 03/30/2022 chest CT.   Acromioclavicular osteoarthritis.   MRI cervical spine wo contrast (07/30/22): FINDINGS: Alignment: Physiologic.   Vertebrae: No fracture, evidence of discitis, or bone lesion.    Cord: Normal signal and morphology.   Posterior Fossa, vertebral arteries, paraspinal tissues: Negative.   Disc levels:   C2-C3: No significant disc bulge. Mild facet arthropathy. No spinal canal stenosis or neural foraminal narrowing.   C3-C4: No significant disc bulge. Left-greater-than-right facet arthropathy. No spinal canal stenosis or neural foraminal narrowing.   C4-C5: No significant disc bulge. Left-greater-than-right facet arthropathy. No spinal canal stenosis or neural foraminal narrowing.   C5-C6: Minimal disc bulge with right paracentral and subarticular disc protrusion, which indents the thecal sac but does not deform the spinal cord. Facet and uncovertebral hypertrophy. No spinal canal stenosis. Mild right neural foraminal narrowing.   C6-C7: No significant disc bulge. No spinal canal stenosis or neuroforaminal narrowing.   C7-T1: No significant disc bulge. No spinal canal stenosis or neuroforaminal narrowing.   IMPRESSION: 1. C5-C6 mild right neural foraminal narrowing. 2. Multilevel facet arthropathy, which can be a cause of pain.   EMG (03/30/23): NCV & EMG Findings: Extensive electrodiagnostic evaluation of bilateral upper limbs shows: Bilateral median antebrachial cutaneous (MAC) and right lateral antebrachial cutaneous (LAC) sensory responses are absent. Bilateral median, bilateral ulnar, and bilateral radial sensory responses show reduced amplitudes (see table below). Left LAC is present. Bilateral median (APB) motor responses show reduced amplitudes (right 1.17, left 4.0 mV). Right ulnar (ADM) motor response shows reduced amplitude (4.0 mV). Left ulnar (ADM) motor response is within normal limits, but borderline normal (7.3 mV). Chronic motor axon loss changes WITH accompanying active denervation changes are seen in the bilateral flexor pollicis longus, right first dorsal interosseous, right extensor inidicis proprius, abductor pollicis brevis, and triceps  muscles. Chronic motor axon loss changes WITHOUT active denervation changes are seen in right biceps, right deltoid, right infraspinatus, left first dorsal interosseous, left extensor indicis proprius, and left abductor pollicis brevis muscles.  Myokymic discharges are seen in the right flexor pollicis longus, right biceps, and right infraspinatus muscles.   Impression: This is an abnormal study. 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 There are myokymic discharges in 3 muscles of the right upper limb, as may be seen in radiation plexopathy. 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 An overlapping  left C8-T1 radiculopathy cannot be completely excluded, though normal cervical paraspinal evaluation makes #2 more likely.   MRI brachial plexus w/wo contrast (04/02/23): FINDINGS: Spinal cord   Normal caliber and signal of visualized spinal cord.   Brachial plexus   Roots: Normal.   Trunks: Normal.   Divisions: Normal.   Cords: Normal.   Branches: Normal.   Muscles and tendons   Muscles are normal. No muscle atrophy or muscle edema. Mild tendinosis of the right supraspinatus tendon. Remainder the rotator cuff tendons bilaterally are intact.   Bones   Cervical spine: Mild broad-based disc bulge at C5-6.   C7 transverse processes: Normal.   Marrow signal: Normal.   Other: Mild osteoarthritis of bilateral glenohumeral joints.   Other findings   Moderate right pleural effusion.   IMPRESSION: 1. Normal MRI of the brachial plexus bilaterally. 2. Mild tendinosis of the right supraspinatus tendon. 3. Moderate right pleural effusion.  ASSESSMENT: This is Delsa Sale, a 63 y.o. 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 His LUE showed abnormal median, ulnar, and radial sensory responses with chronic neurogenic changes seen in the left FDI, FPL (with active changes), and APB, suggesting a possible lower trunk predominant process vs C8 and T1 radiculopathy. Patient continued to have progressive weakness of the RUE which now has no movement proximally or distally. LUE symptoms are mostly limited to hand, but profound. His pain is much improved with spinal cord stimulator.   Unfortunately, patient's weakness not only progressed, but he also developed hoarseness of his voice. 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 His has an up coming appointment with oncology to discuss his options.  Plan: -Continue pain medications as per Dr. Cherylann Ratel, but symptoms currently well controlled with cymbalta 60 mg and amitriptyline 75 mg daily and spinal cord stimulator (placed by Dr. Katrinka Blazing) -Follow up with oncology as planned -Patient would like to continue working as this brings him enjoyment. I provided letter for patient today allowing him to return to work, no more than 4-6 hours in office and able to work from home as he requested.  Return to clinic in 3 months  Total time spent reviewing records, interview, history/exam, documentation, and coordination of care on day of encounter:  35 min  Jacquelyne Balint, MD

## 2023-10-13 NOTE — Telephone Encounter (Signed)
 Oral Oncology Patient Advocate Encounter   Was successful in obtaining a copay card for [place]  This copay card will make the patients copay $0.   The billing information is as follows and has been shared with [facility].   RxBin: [id number] PCN: [id number] Member ID: [id number] Group ID: [id number]   Omer Jack, CPhT-Adv Oncology Pharmacy Patient Advocate [facility]  Direct Number: [phone]  Fax: [phone]

## 2023-10-13 NOTE — Telephone Encounter (Addendum)
 Oral Oncology Pharmacist Encounter  I met with patient and patient's wife, Teal Bontrager, in clinic for overview of new oral chemotherapy medication: [name] (lorlatinib) for the treatment of metastatic non-small cell lung cancer, ALK positive, planned duration until disease progression or unacceptable drug toxicity.   Counseled patient on administration, dosing, side effects, monitoring, drug-food interactions, safe handling, storage, and disposal.   CBC w/ Diff and CMP from 10/13/23 assessed, no baseline dose adjustments required at this time. Patient will also have a baseline lipid panel obtained in clinic today prior to starting therapy. Prescription dose and frequency assessed for appropriateness.  Patient will take [name] 100 mg tablets, 1 tablet by mouth daily with or without food.  Patient knows to avoid grapefruit and grapefruit juice while on [place]   Start date: 10/20/23 (patient's last dose of [name] on 10/19/23 AM)   Side effects include but not limited to: increased blood pressure, edema, changes in electrolytes, increase in cholesterol and triglycerides, changes in LFTs, peripheral neuropathy, CNS/mood changes.   Reviewed with patient importance of keeping a medication schedule and plan for any missed doses.   After discussion with patient no patient barriers to medication adherence identified. Current medication list in Epic reviewed, DDIs with [place] identified: Category C drug-drug interaction between [place] and Amlodipine - [name], a CYP3A4 inducer may decrease serum concentrations of amlodipine, thus efficacy of amlodipine. Patient BP 125/85 mmHg in clinic today. No changes in therapy warranted at this time.  Category C drug-drug interaction between [place] and Atorvastatin - [name], a CYP3A4 inducer may decrease serum concentrations of atorvastatin, thus efficacy of atorvastatin. Patient is having a baseline lipid panel obtained in clinic today prior to  starting [place] No changes in atorvastatin dose warranted at this time (noted patient on lower dose atorvastatin, and if needed dose could be escalated in the future if needed) Category C drug-drug interaction between [place] and Oxycodone - [name] a moderate CYP3A4 inducer may decrease serum concentration of oxycodone. No changes in therapy warranted at this time - recommend monitoring patient for breakthrough pain and adjust oxycodone dose if needed in the future.    Patient agreement for treatment documented in MD note on 10/13/23.  Mr. Summons voiced understanding and appreciation. All questions answered. Medication handout provided.   Provided patient with Oral Chemotherapy Navigation Clinic phone number. Patient knows to call the office with questions or concerns. Oral Chemotherapy Navigation Clinic will continue to follow.  Sherry Ruffing, PharmD, BCPS, BCOP Hematology/Oncology Clinical Pharmacist [facility] Oral Chemotherapy Navigation Clinics [phone] 10/13/2023 9:07 AM

## 2023-10-13 NOTE — Telephone Encounter (Signed)
 Oral Oncology Patient Advocate Encounter  Prior Authorization for [employer] [place] has been approved.    PA# [id number] Effective dates: 10/13/23 through 10/12/24  Patient must fill at [facility].    Omer Jack, CPhT-Adv Oncology Pharmacy Patient Advocate [facility]  Direct Number: [phone]  Fax: [phone]

## 2023-10-14 MED ORDER — LORLATINIB 100 MG PO TABS: 100.0000 mg | ORAL_TABLET | Freq: Every day | ORAL | 2 refills | Status: DC

## 2023-10-15 ENCOUNTER — Telehealth: Payer: Self-pay | Admitting: Internal Medicine

## 2023-10-15 NOTE — Telephone Encounter (Signed)
 Left the patient a voicemail with the scheduled appointment details.

## 2023-10-18 ENCOUNTER — Ambulatory Visit (INDEPENDENT_AMBULATORY_CARE_PROVIDER_SITE_OTHER): Payer: BC Managed Care – PPO | Admitting: Physician Assistant

## 2023-10-18 ENCOUNTER — Encounter: Payer: Self-pay | Admitting: Physician Assistant

## 2023-10-18 ENCOUNTER — Ambulatory Visit: Payer: BC Managed Care – PPO | Admitting: Radiation Oncology

## 2023-10-18 VITALS — BP 108/72 | Temp 98.4°F | Ht 68.0 in | Wt 160.0 lb

## 2023-10-18 DIAGNOSIS — Z09 Encounter for follow-up examination after completed treatment for conditions other than malignant neoplasm: Secondary | ICD-10-CM

## 2023-10-18 DIAGNOSIS — G540 Brachial plexus disorders: Secondary | ICD-10-CM

## 2023-10-18 MED ORDER — CYCLOBENZAPRINE HCL 5 MG PO TABS
5.0000 mg | ORAL_TABLET | Freq: Three times a day (TID) | ORAL | 0 refills | Status: DC | PRN
Start: 2023-10-18 — End: 2023-12-10

## 2023-10-18 NOTE — Progress Notes (Signed)
   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 Overall, he is doing well.  His pain is under control.  He is taking Tylenol and Flexeril as needed.  No new weakness, numbness, or tingling.  PHYSICAL EXAMINATION:  NEUROLOGICAL:  General: In no acute distress.   Awake, alert, oriented to person, place, and time.    Incisions are clean, dry, intact.  No erythema or drainage.  Exam is to baseline.  Incision c/d/I  Imaging:  No new imaging  Assessment / Plan: TERALD JUMP is doing well after cervical spinal cord stimulator placement 2 weeks ago.  I am pleased that his pain is well-controlled.  Representative for stimulator company is here today in clinic to optimize this for him.  Refill of Flexeril sent.  He was encouraged to still take Tylenol as needed.  We discussed activity escalation and I have advised the patient to lift up to 10 pounds until 6 weeks after surgery, then increase up to 25 pounds until 12 weeks after surgery.  After 12 weeks post-op, the patient advised to increase activity as tolerated. he will return to clinic in approximately 1 month.    Advised to contact the office if any questions or concerns arise.   Joan Flores PA-C Dept of Neurosurgery

## 2023-10-21 ENCOUNTER — Encounter: Payer: Self-pay | Admitting: Neurology

## 2023-10-21 ENCOUNTER — Other Ambulatory Visit: Payer: Self-pay

## 2023-10-21 ENCOUNTER — Telehealth: Payer: Self-pay | Admitting: Radiation Oncology

## 2023-10-21 ENCOUNTER — Ambulatory Visit: Payer: BC Managed Care – PPO | Admitting: Neurology

## 2023-10-21 VITALS — BP 109/76 | HR 97 | Ht 67.0 in | Wt 154.0 lb

## 2023-10-21 DIAGNOSIS — M79602 Pain in left arm: Secondary | ICD-10-CM

## 2023-10-21 DIAGNOSIS — M6259 Muscle wasting and atrophy, not elsewhere classified, multiple sites: Secondary | ICD-10-CM | POA: Diagnosis not present

## 2023-10-21 DIAGNOSIS — R29898 Other symptoms and signs involving the musculoskeletal system: Secondary | ICD-10-CM

## 2023-10-21 DIAGNOSIS — M792 Neuralgia and neuritis, unspecified: Secondary | ICD-10-CM

## 2023-10-21 DIAGNOSIS — G540 Brachial plexus disorders: Secondary | ICD-10-CM | POA: Diagnosis not present

## 2023-10-21 DIAGNOSIS — C3491 Malignant neoplasm of unspecified part of right bronchus or lung: Secondary | ICD-10-CM

## 2023-10-21 DIAGNOSIS — M79601 Pain in right arm: Secondary | ICD-10-CM

## 2023-10-21 NOTE — Patient Instructions (Signed)
 -Continue pain medications as per Dr. Cherylann Ratel: Cymbalta 60 mg and amitriptyline 75 mg daily -Follow up with oncology as planned  Return to clinic in 3 months  Please let me know if you have any questions or concerns in the meantime.   The physicians and staff at [facility] are committed to providing excellent care. You may receive a survey requesting feedback about your experience at our office. We strive to receive "very good" responses to the survey questions. If you feel that your experience would prevent you from giving the office a "very good " response, please contact our office to try to remedy the situation. We may be reached at [phone]. Thank you for taking the time out of your busy day to complete the survey.  Jacquelyne Balint, MD [facility]  Preventing Falls at [facility] are common, often dreaded events in the lives of older people. Aside from the obvious injuries and even death that may result, fall can cause wide-ranging consequences including loss of independence, mental decline, decreased activity and mobility. Younger people are also at risk of falling, especially those with chronic illnesses and fatigue.  Ways to reduce risk for falling Examine diet and medications. Warm foods and alcohol dilate blood vessels, which can lead to dizziness when standing. Sleep aids, antidepressants and pain medications can also increase the likelihood of a fall.  Get a vision exam. Poor vision, cataracts and glaucoma increase the chances of falling.  Check foot gear. Shoes should fit snugly and have a sturdy, nonskid sole and a broad, low heel  Participate in a physician-approved exercise program to build and maintain muscle strength and improve balance and coordination. Programs that use ankle weights or stretch bands are excellent for muscle-strengthening. Water aerobics programs and low-impact Tai Chi programs have also been shown to improve balance and  coordination.  Increase vitamin D intake. Vitamin D improves muscle strength and increases the amount of calcium the body is able to absorb and deposit in bones.  How to prevent falls from common hazards Floors - Remove all loose wires, cords, and throw rugs. Minimize clutter. Make sure rugs are anchored and smooth. Keep furniture in its usual place.  Chairs -- Use chairs with straight backs, armrests and firm seats. Add firm cushions to existing pieces to add height.  Bathroom - Install grab bars and non-skid tape in the tub or shower. Use a bathtub transfer bench or a shower chair with a back support Use an elevated toilet seat and/or safety rails to assist standing from a low surface. Do not use towel racks or bathroom tissue holders to help you stand.  Lighting - Make sure halls, stairways, and entrances are well-lit. Install a night light in your bathroom or hallway. Make sure there is a light switch at the top and bottom of the staircase. Turn lights on if you get up in the middle of the night. Make sure lamps or light switches are within reach of the bed if you have to get up during the night.  Kitchen - Install non-skid rubber mats near the sink and stove. Clean spills immediately. Store frequently used utensils, pots, pans between waist and eye level. This helps prevent reaching and bending. Sit when getting things out of lower cupboards.  Living room/ Bedrooms - Place furniture with wide spaces in between, giving enough room to move around. Establish a route through the living room that gives you something to hold onto as you walk.  Stairs - Make  sure treads, rails, and rugs are secure. Install a rail on both sides of the stairs. If stairs are a threat, it might be helpful to arrange most of your activities on the lower level to reduce the number of times you must climb the stairs.  Entrances and doorways - Install metal handles on the walls adjacent to the doorknobs of all doors to make  it more secure as you travel through the doorway.  Tips for maintaining balance Keep at least one hand free at all times. Try using a backpack or fanny pack to hold things rather than carrying them in your hands. Never carry objects in both hands when walking as this interferes with keeping your balance.  Attempt to swing both arms from front to back while walking. This might require a conscious effort if Parkinson's disease has diminished your movement. It will, however, help you to maintain balance and posture, and reduce fatigue.  Consciously lift your feet off of the ground when walking. Shuffling and dragging of the feet is a common culprit in losing your balance.  When trying to navigate turns, use a "U" technique of facing forward and making a wide turn, rather than pivoting sharply.  Try to stand with your feet shoulder-length apart. When your feet are close together for any length of time, you increase your risk of losing your balance and falling.  Do one thing at a time. Don't try to walk and accomplish another task, such as reading or looking around. The decrease in your automatic reflexes complicates motor function, so the less distraction, the better.  Do not wear rubber or gripping soled shoes, they might "catch" on the floor and cause tripping.  Move slowly when changing positions. Use deliberate, concentrated movements and, if needed, use a grab bar or walking aid. Count 15 seconds between each movement. For example, when rising from a seated position, wait 15 seconds after standing to begin walking.  If balance is a continuous problem, you might want to consider a walking aid such as a cane, walking stick, or walker. Once you've mastered walking with help, you might be ready to try it on your own again.

## 2023-10-21 NOTE — Telephone Encounter (Addendum)
 Called patient's spouse to follow up w. Scheduling a FUN visit for the patient w. PA [name]. The patient's spouse stated she will discuss scheduling a FUN visit w. The patient and give our office a call back.

## 2023-10-22 ENCOUNTER — Other Ambulatory Visit: Payer: Self-pay

## 2023-10-22 ENCOUNTER — Ambulatory Visit (HOSPITAL_COMMUNITY)
Admission: RE | Admit: 2023-10-22 | Discharge: 2023-10-22 | Disposition: A | Discharge status: Home / Self Care | Source: Ambulatory Visit | Attending: Internal Medicine | Admitting: Internal Medicine

## 2023-10-22 DIAGNOSIS — C770 Secondary and unspecified malignant neoplasm of lymph nodes of head, face and neck: Secondary | ICD-10-CM | POA: Diagnosis not present

## 2023-10-22 DIAGNOSIS — J90 Pleural effusion, not elsewhere classified: Secondary | ICD-10-CM | POA: Insufficient documentation

## 2023-10-22 DIAGNOSIS — C771 Secondary and unspecified malignant neoplasm of intrathoracic lymph nodes: Secondary | ICD-10-CM | POA: Diagnosis not present

## 2023-10-22 DIAGNOSIS — C3411 Malignant neoplasm of upper lobe, right bronchus or lung: Secondary | ICD-10-CM | POA: Insufficient documentation

## 2023-10-22 DIAGNOSIS — C3490 Malignant neoplasm of unspecified part of unspecified bronchus or lung: Secondary | ICD-10-CM | POA: Diagnosis not present

## 2023-10-22 LAB — GLUCOSE, CAPILLARY: Glucose-Capillary: 103 mg/dL — ABNORMAL HIGH (ref 70–99)

## 2023-10-22 MED ORDER — FLUDEOXYGLUCOSE F - 18 (FDG) INJECTION
7.7000 | Freq: Once | INTRAVENOUS | Status: AC
  Administered 2023-10-22: 7.65 via INTRAVENOUS

## 2023-10-26 ENCOUNTER — Encounter: Payer: Self-pay | Admitting: Radiation Oncology

## 2023-10-26 ENCOUNTER — Ambulatory Visit
Admission: RE | Admit: 2023-10-26 | Discharge: 2023-10-26 | Disposition: A | Discharge status: Home / Self Care | Source: Ambulatory Visit | Attending: Radiation Oncology | Admitting: Radiation Oncology

## 2023-10-26 ENCOUNTER — Telehealth: Payer: Self-pay | Admitting: Radiation Oncology

## 2023-10-26 VITALS — BP 119/79 | HR 93 | Temp 97.6°F | Resp 20 | Ht 67.0 in | Wt 153.6 lb

## 2023-10-26 DIAGNOSIS — J90 Pleural effusion, not elsewhere classified: Secondary | ICD-10-CM | POA: Insufficient documentation

## 2023-10-26 DIAGNOSIS — F40240 Claustrophobia: Secondary | ICD-10-CM | POA: Diagnosis not present

## 2023-10-26 DIAGNOSIS — C778 Secondary and unspecified malignant neoplasm of lymph nodes of multiple regions: Secondary | ICD-10-CM | POA: Insufficient documentation

## 2023-10-26 DIAGNOSIS — Z923 Personal history of irradiation: Secondary | ICD-10-CM | POA: Diagnosis not present

## 2023-10-26 DIAGNOSIS — E039 Hypothyroidism, unspecified: Secondary | ICD-10-CM | POA: Diagnosis not present

## 2023-10-26 DIAGNOSIS — J449 Chronic obstructive pulmonary disease, unspecified: Secondary | ICD-10-CM | POA: Insufficient documentation

## 2023-10-26 DIAGNOSIS — Z87442 Personal history of urinary calculi: Secondary | ICD-10-CM | POA: Insufficient documentation

## 2023-10-26 DIAGNOSIS — C3411 Malignant neoplasm of upper lobe, right bronchus or lung: Secondary | ICD-10-CM | POA: Insufficient documentation

## 2023-10-26 DIAGNOSIS — I10 Essential (primary) hypertension: Secondary | ICD-10-CM | POA: Insufficient documentation

## 2023-10-26 DIAGNOSIS — Z87891 Personal history of nicotine dependence: Secondary | ICD-10-CM | POA: Diagnosis not present

## 2023-10-26 DIAGNOSIS — M67813 Other specified disorders of tendon, right shoulder: Secondary | ICD-10-CM | POA: Diagnosis not present

## 2023-10-26 DIAGNOSIS — J432 Centrilobular emphysema: Secondary | ICD-10-CM | POA: Diagnosis not present

## 2023-10-26 DIAGNOSIS — Z86718 Personal history of other venous thrombosis and embolism: Secondary | ICD-10-CM | POA: Insufficient documentation

## 2023-10-26 DIAGNOSIS — C7931 Secondary malignant neoplasm of brain: Secondary | ICD-10-CM | POA: Insufficient documentation

## 2023-10-26 DIAGNOSIS — J439 Emphysema, unspecified: Secondary | ICD-10-CM | POA: Insufficient documentation

## 2023-10-26 DIAGNOSIS — C770 Secondary and unspecified malignant neoplasm of lymph nodes of head, face and neck: Secondary | ICD-10-CM

## 2023-10-26 DIAGNOSIS — Z79899 Other long term (current) drug therapy: Secondary | ICD-10-CM | POA: Diagnosis not present

## 2023-10-26 DIAGNOSIS — J3801 Paralysis of vocal cords and larynx, unilateral: Secondary | ICD-10-CM | POA: Insufficient documentation

## 2023-10-26 MED ORDER — LORAZEPAM 0.5 MG PO TABS: ORAL_TABLET | ORAL | 0 refills | Status: DC

## 2023-10-26 NOTE — Progress Notes (Signed)
 Nursing interview for discussion of Rad TX to the lymph nodes in the LT neck.  Patient identity verified x2.  Patient reports weakness, paralyzation and a throbbing sensation in bilateral arms/hands, w/ occasional sharp jolts of pain. LT chest pain (states muscular). Also reports limited ROM in the neck. Patient denies any other related issues at this time.  Meaningful use complete.  Vitals- BP 119/79 (BP Location: Right Arm, Patient Position: Sitting, Cuff Size: Normal)   Pulse 93   Temp 97.6 F (36.4 C) (Temporal)   Resp 20   Ht 5\' 7"  (1.702 m)   Wt 153 lb 9.6 oz (69.7 kg)   SpO2 99%   BMI 24.06 kg/m   This concludes the interaction.  Ruel Favors, LPN

## 2023-10-26 NOTE — Telephone Encounter (Signed)
 I called to review the patient's PET scan results this afternoon. A copy will be printed and given to him tomorrow in simulation.  A prescription for Ativan will also be sent to his pharmacy for claustrophobia.

## 2023-10-26 NOTE — Progress Notes (Signed)
 Radiation Oncology         [phone] ________________________________  Outpatient Follow Up   Name: Brian [name]        MRN: [id number]  Date of Service: 10/26/2023 DOB: [birth year]  WU:[id number], Elana Alm, MD  Si Gaul, MD     REFERRING PHYSICIAN: Si Gaul, MD   DIAGNOSIS: The primary encounter diagnosis was Malignant neoplasm metastatic to brain [facility]). A diagnosis of Cancer of head, face, or neck lymph nodes, secondary (HCC) was also pertinent to this visit.   HISTORY OF PRESENT ILLNESS: Brian [name] is a 63 y.o. male with a history of Stage IIIB, cT1cN3M0, NSCLC, adenocarcinoma in January 2020.  He was treated with chemoradiation under the care of Dr. Arbutus Ped and Dr. Roselind Messier.  He was found to have progressive disease in the summer 2020 in the right cervical lymph node chain and was treated with definitive radiotherapy.  He received consolidative immunotherapy.  He was found to have recurrent disease in the left cervical lymph nodes, and biopsy on 02/16/2020 confirmed the diagnosis.  He was ready to proceed with systemic chemotherapy, and an MRI brain on 02/21/20 for restaging revealed a 5 mm right lateral temporal lobe metastasis. A 3T MRI scan of the brain with and without contrast on 03/01/2020 showed the 5 mm metastasis in the right lateral temporal lobe, a 2 mm metastasis in the left posterior frontal cortex, and a 3 mm metastasis along the undersurface of the right thalamus.  Small vessel ischemic changes were also noted within the hemispheric white matter and were stable. He begin taking [name].   He met with our team and Dr. Mitzi Hansen offered [facility] treatment to these three lesions. The patient was prepared to proceed, but couldn't tolerate the mask for simulation and declined to proceed. Since, his MRIs that have been followed closely have noted treatment response. His most recent scan of the brain on 10/15/22 showed no evidence of intracranial metastatic disease. At  that time he had noted edema or swelling of his right neck was going to be having a CT of his neck. The patient desired annual MRI after discussion and we agreed.   CT scan of his neck after our last visit on 11/06/23 did not show any metastatic disease. He continued to have neck tightness and pain in the base of his neck that radiated through the right shoulder and into his right arm. He was seen by Dr. Loleta Chance in Neurology. An EMG  showed evidence of brachial plexopathy in the RUE, and though an MRI of the brachial plexus on 04/02/23 showed normal plexus bilaterally, there was mild tendinosis of the right supraspinatus. He was given Cymbalta and Gabapentin and referred to Dr. Katrinka Blazing in Neurosurgery. He underwent right interscalene block on 05/12/23 and also physical therapy. Unfortunately the pain initially improved after the block, then worsened and another nerve block of the Stellate Ganglion region was performed on 06/28/23. This improved initially but he was also starting to have progressive symptoms in the left side. He cancelled his MRI scan in February 2025 for brain follow up due to pain and inability to tolerate laying flat for evaluation. He had evaluation as well with ENT due to voice changes. He saw Dr. Andee Poles who diagnosed a right vocal cord paralysis. A CT of the neck with contrast on 09/27/23 showed evidence of multipla left sided cervical nodes and a 4 cm ill defined mass like soft tissue extending from right level IV nodes and into the upper  chest with encasement of the right subclavian artery and likely brachial plexus involvement. This was offered by the report as the source of his right vocal cord paresis.   Given the persistence of symptoms, he underwent spinal cord neurostimulator implant on 10/05/23, but has a device that is MRI compatible. Dr. Arbutus Ped A PET scan on 10/22/23 has been performed but not read.  He's seen to consider a palliative course of radiotherapy to the adenopathy in the neck. He  was switched from [name] to [name] however could not tolerate more than one dose, and has continued [name].   PREVIOUS RADIATION THERAPY: Yes  03/06/2019-04/13/2019: The right cervical node chain was treated to 60 Gy in 30 fractions under the care of Dr. Roselind Messier.  09/05/2018-10/14/2018:  The right lung target was treated to 60 Gy in 30 fractions under the care of Dr. Roselind Messier.   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    Non-small cell cancer of right lung (HCC) 08/23/2018   Pneumonia    Pre-diabetes    Radiation-induced brachial plexopathy    Recurrent right pleural effusion    Secondary malignant neoplasm of brain and spinal cord (HCC)    Subarachnoid hemorrhage (HCC) 2016   Voice hoarseness        PAST SURGICAL HISTORY: Past Surgical History:  Procedure Laterality Date   COLONOSCOPY     FEMUR FRACTURE SURGERY Left 1969   INGUINAL HERNIA REPAIR Right 06/24/2020   Procedure: RIGHT OPEN INGUINAL HERNIA REPAIR WITH MESH;  Surgeon: Kinsinger, De Blanch, MD;  Location: [facility]S;  Service: General;  Laterality: Right;   IR IVC FILTER PLMT / S&I Lenise Arena GUID/MOD SED  03/04/2020   IR IVC FILTER RETRIEVAL / S&I Lenise Arena GUID/MOD SED  08/15/2020   IR RADIOLOGIST EVAL & MGMT  08/06/2020   SPINAL CORD STIMULATOR INSERTION N/A 10/05/2023   Procedure: CERVICAL SPINAL CORD STIMULATOR PLACEMENT;  Surgeon: Lovenia Kim, MD;  Location: [facility] ORS;  Service: Neurosurgery;  Laterality: N/A;     FAMILY HISTORY:  Family History  Problem Relation Age of Onset   Diabetes Mother    Diabetes Brother    Colon cancer Neg Hx    Stomach cancer Neg  Hx    Thyroid cancer Neg Hx      SOCIAL HISTORY:  reports that he has quit smoking. His smoking use included cigarettes. He has never used smokeless tobacco. He reports current alcohol use of about 5.0 standard drinks of alcohol per week. He reports that he does not use drugs.  The patient is married and lives in [place] and worked as an Art gallery manager for a company that [employer] that is used in the [employer]. He's accompanied by his wife. They have an adult daughter who lives in [place] state, and son in [place].   ALLERGIES: Patient has no known allergies.   MEDICATIONS:  Current Outpatient Medications  Medication Sig Dispense Refill   acetaminophen (TYLENOL) 500 MG tablet Take 1,000 mg by mouth every 6 (six) hours as needed (pain.).     albuterol (VENTOLIN HFA) 108 (90 Base) MCG/ACT inhaler INHALE 2  PUFFS INTO THE LUNGS EVERY 4 (FOUR) HOURS AS NEEDED FOR WHEEZING OR SHORTNESS OF BREATH. 6.7 each 4   amitriptyline (ELAVIL) 25 MG tablet Take 3 tablets (75 mg total) by mouth at bedtime. 90 tablet 0   amLODipine (NORVASC) 5 MG tablet Take 5 mg by mouth in the morning.     atorvastatin (LIPITOR) 20 MG tablet Take 20 mg by mouth in the morning. (Patient not taking: Reported on 10/21/2023)     baclofen (LIORESAL) 10 MG tablet Take 10 mg by mouth daily.     cyclobenzaprine (FLEXERIL) 5 MG tablet Take 1 tablet (5 mg total) by mouth 3 (three) times daily as needed for muscle spasms. (Patient not taking: Reported on 10/21/2023) 30 tablet 0   DULoxetine (CYMBALTA) 60 MG capsule Take 1 capsule (60 mg total) by mouth daily. 90 capsule 3   levothyroxine (SYNTHROID) 100 MCG tablet Take 100 mcg by mouth daily before breakfast.     lorlatinib ([name]) 100 MG tablet Take 1 tablet (100 mg total) by mouth daily. Swallow tablets whole. Do not chew, crush or split tablets. 30 tablet 2   mirtazapine (REMERON) 15 MG tablet Take 1 tablet (15 mg total) by mouth at bedtime. (Patient not taking: Reported  on 10/21/2023) 30 tablet 2   mupirocin ointment (BACTROBAN) 2 % Place 1 Application into the nose 2 (two) times daily for 60 doses. Use as directed 2 times daily for 5 days every other week for 6 weeks. (Patient not taking: Reported on 10/21/2023) 60 g 0   oxyCODONE (ROXICODONE) 5 MG immediate release tablet Take 1 tablet (5 mg total) by mouth every 4 (four) hours as needed for severe pain (pain score 7-10). (Patient not taking: Reported on 10/21/2023) 30 tablet 0   PREDNISONE PO Take by mouth. Unsure of dosage; ordered by Dr. Andee Poles for hoarseness for 6 days (Patient not taking: Reported on 10/21/2023)     TRELEGY ELLIPTA 100-62.5-25 MCG/INH AEPB Inhale 1 puff into the lungs in the morning.     venlafaxine (EFFEXOR) 37.5 MG tablet Take 37.5 mg by mouth in the morning.     No current facility-administered medications for this encounter.     REVIEW OF SYSTEMS: The patient reports he is struggling with daily life due to loss of control of his RUE completely and has had progressive loss of the strength in his LUE. He is using assistive devices with the help of OT to feed himself. He is unable to drive and has to lay on his right side to sleep at night. He describes pain in his his neck and into his upper extremities and now progressive changes of discomfort and movement of his LUE. He has lost about 30 pounds in the last year. He has discomfort with swallowing intermittently. He also notes that his voice remains hoarse. No other complaints are verbalized.   PHYSICAL EXAM:  Wt Readings from Last 3 Encounters:  10/26/23 153 lb 9.6 oz (69.7 kg)  10/21/23 154 lb (69.9 kg)  10/18/23 160 lb (72.6 kg)   Temp Readings from Last 3 Encounters:  10/26/23 97.6 F (36.4 C) (Temporal)  10/18/23 98.4 F (36.9 C) (Oral)  10/13/23 98.4 F (36.9 C)   BP Readings from Last 3 Encounters:  10/26/23 119/79  10/21/23 109/76  10/18/23 108/72   Pulse Readings from Last 3 Encounters:  10/26/23 93  10/21/23 97   10/13/23 91   In general this is a tired appearing caucasian male in no acute distress. He's  alert and oriented x4 and appropriate throughout the examination. Cardiopulmonary assessment is negative for acute distress and he exhibits normal effort. His RUE has smooth skin and his hand has some contracture, and he has noticeable muscle wasting. The LUE has more tone and control but lack of grip strength, perhaps at a rate of 2/5 strength.    ECOG = 1  0 - Asymptomatic (Fully active, able to carry on all predisease activities without restriction)  1 - Symptomatic but completely ambulatory (Restricted in physically strenuous activity but ambulatory and able to carry out work of a light or sedentary nature. For example, light housework, office work)  2 - Symptomatic, <50% in bed during the day (Ambulatory and capable of all self care but unable to carry out any work activities. Up and about more than 50% of waking hours)  3 - Symptomatic, >50% in bed, but not bedbound (Capable of only limited self-care, confined to bed or chair 50% or more of waking hours)  4 - Bedbound (Completely disabled. Cannot carry on any self-care. Totally confined to bed or chair)  5 - Death   Santiago Glad MM, Creech RH, Tormey DC, et al. [phone]). "Toxicity and response criteria of the [facility] Group". Am. Evlyn Clines. Oncol. 5 (6): 649-55    LABORATORY DATA:  Lab Results  Component Value Date   WBC 15.0 (H) 10/13/2023   HGB 11.7 (L) 10/13/2023   HCT 34.0 (L) 10/13/2023   MCV 85.9 10/13/2023   PLT 365 10/13/2023   Lab Results  Component Value Date   NA 139 10/13/2023   K 3.4 (L) 10/13/2023   CL 102 10/13/2023   CO2 31 10/13/2023   Lab Results  Component Value Date   ALT 26 10/13/2023   AST 28 10/13/2023   ALKPHOS 304 (H) 10/13/2023   BILITOT 1.0 10/13/2023      RADIOGRAPHY: NM PET Image Restage (PS) Skull Base to Thigh (F-18 FDG) Result Date: 10/26/2023 CLINICAL DATA:  Subsequent treatment  strategy for non-small cell lung cancer. EXAM: NUCLEAR MEDICINE PET SKULL BASE TO THIGH TECHNIQUE: 7.6 mCi F-18 FDG was injected intravenously. Full-ring PET imaging was performed from the skull base to thigh after the radiotracer. CT data was obtained and used for attenuation correction and anatomic localization. Fasting blood glucose: 103 mg/dl COMPARISON:  96/11/5407 FINDINGS: NECK: Bilateral intensely hypermetabolic lymph nodes in the lower neck/upper thoracic inlet. For example lymph nodes the posterior lower LEFT neck measuring 9 mm and 15 mm on image 27/4 have intense metabolic activity SUV max equal 13.9 and SUV max equalc 7.6. Hypermetabolic RIGHT supraclavicular node measures 21 mm on image 31 with SUV max equal 12.8. Incidental CT findings: None. CHEST: Hypermetabolic lymph node inferior to the carina adjacent to the esophagus and RIGHT mainstem bronchus SUV max equal 23.8 on image 37. This node is difficult to place on noncontrast CT. No hypermetabolic pulmonary nodules. Large layering RIGHT pleural effusion. Incidental CT findings: Stimulator pack in the posteriorly LEFT back with electrodes extending into the posterior cervical region. ABDOMEN/PELVIS: No abnormal hypermetabolic activity within the liver, pancreas, adrenal glands, or spleen. No hypermetabolic lymph nodes in the abdomen or pelvis. Incidental CT findings: None. SKELETON: No focal hypermetabolic activity to suggest skeletal metastasis. Incidental CT findings: None. IMPRESSION: 1. Bilateral hypermetabolic lymph nodes in the lower neck/upper thoracic inlet consistent with nodal metastasis. 2. Hypermetabolic lymph node inferior to the carina consistent with nodal metastasis. 3. No lung nodules. 4. Large layering RIGHT pleural effusion. Electronically Signed  By: Genevive Bi M.D.   On: 10/26/2023 10:53   DG Cervical Spine 2-3 Views Result Date: 10/05/2023 CLINICAL DATA:  Elective surgery.  Cervical spine stimulator. EXAM: CERVICAL  SPINE - 2-3 VIEW COMPARISON:  None Available. FINDINGS: Two fluoroscopic spot views of the cervical spine submitted from the operating room. Spinal stimulator with lead tip posterior at the C3-C4 level. Fluoroscopy time 2 seconds. Dose 0.35 mGy IMPRESSION: Intraoperative fluoroscopy during cervical spine stimulator placement. Electronically Signed   By: Narda Rutherford M.D.   On: 10/05/2023 16:12   DG C-Arm 1-60 Min-No Report Result Date: 10/05/2023 Fluoroscopy was utilized by the requesting physician.  No radiographic interpretation.   DG C-Arm 1-60 Min-No Report Result Date: 10/05/2023 Fluoroscopy was utilized by the requesting physician.  No radiographic interpretation.   CT SOFT TISSUE NECK W CONTRAST Result Date: 09/30/2023 CLINICAL DATA:  Right vocal cord paresis.  History of lung cancer. EXAM: CT NECK WITH CONTRAST TECHNIQUE: Multidetector CT imaging of the neck was performed using the standard protocol following the bolus administration of intravenous contrast. RADIATION DOSE REDUCTION: This exam was performed according to the departmental dose-optimization program which includes automated exposure control, adjustment of the mA and/or kV according to patient size and/or use of iterative reconstruction technique. CONTRAST:  75mL OMNIPAQUE IOHEXOL 350 MG/ML SOLN COMPARISON:  Neck CT 11/06/2022 FINDINGS: Pharynx and larynx: New findings of right vocal cord paresis including medial positioning of the right vocal cord and asymmetric enlargement of the right laryngeal ventricle and piriform sinus. No mass. Salivary glands: No inflammation, mass, or stone. Thyroid: Diffusely small and otherwise unremarkable. Lymph nodes: Multiple new abnormally rounded and heterogeneously hypodense lymph nodes in the left neck including level II nodes measuring up to 10 mm in short axis, level III nodes measuring up to 11 mm, and level V nodes measuring up to 11 mm. New ill-defined, masslike soft tissue in the right lower  neck measuring approximately 4 x 3 cm located in level IV and extending into the superior mediastinum encasing the right subclavian and right vertebral arteries and involving the lower scalene musculature and brachial plexus (for example images 114 and 119 of series 4). Vascular: Mild narrowing of the right subclavian artery where it is encased by the above described soft tissue. Major vascular structures of the neck appear patent. Limited intracranial: Unremarkable. Visualized orbits: Unremarkable. Mastoids and visualized paranasal sinuses: Extensive mucosal thickening in the right maxillary sinus. Right anterior ethmoid air cell opacification. Clear mastoid air cells. Skeleton: Mild cervical spondylosis.  No suspicious lesion. Upper chest: Partially visualized right pleural effusion which is partially loculated. Other: None. IMPRESSION: 1. Evidence of metastatic disease in the neck including multiple pathologic left-sided cervical lymph nodes as well as a 4 cm region of ill-defined masslike soft tissue extending from right level IV into the upper chest with encasement of the right subclavian artery and likely brachial plexus involvement. 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 2. Partially visualized right pleural effusion. Electronically Signed   By: Sebastian Ache M.D.   On: 09/30/2023 17:44        IMPRESSION/PLAN: 1. Progressive metastatic non-small cell lung cancer, adenocarcinoma originally in the right lung with prior concern for brain metastases, now with local cervical  adenopathy. Dr. Mitzi Hansen has reviewed the patient's recent imaging including PET scan results. We discussed the options to consider palliative radiotherapy to bilateral cervical neck nodes over 2-3 weeks. We discussed the risks, benefits, short and long term effects of  radiotherapy and the consideration given his complications from prior radiation and location of disease recurrence. The patient would like to consider  his options tonight. I will let him know the final read on his PET scan as well. He will let us know how he'd like to proceed and desires to forgo signing consent today. He has an appointment for simulation tomorrow and we will reconvene at that time to see how he would like to proceed. He is also scheduled with Dr. Arbutus Ped next week. We will also reschedule his MRI of the brain. 2. Claustrophobia. The patient will take Ativan  prior to  MRI scans and notify us if he needs a new prescription prior to next year's scan. 3. Brachial Plexopathy. The patient has undergone several interventions including placement of cervical nerve stimulator. He will continue to follow up with Dr. Loleta Chance, Dr. Katrinka Blazing, and Dr. Cherylann Ratel for further direction and management of his symptoms. He will follow with PT/OT, and Dr. Andee Poles in ENT as well.    In a visit lasting 45 minutes, greater than 50% of the time was spent face to face discussing the patient's condition, in preparation for the discussion, and coordinating the patient's care.      Osker Mason, PAC

## 2023-10-27 ENCOUNTER — Ambulatory Visit
Admission: RE | Admit: 2023-10-27 | Discharge: 2023-10-27 | Disposition: A | Discharge status: Home / Self Care | Source: Ambulatory Visit | Attending: Radiation Oncology | Admitting: Radiation Oncology

## 2023-10-27 ENCOUNTER — Other Ambulatory Visit: Payer: Self-pay

## 2023-10-27 DIAGNOSIS — C3411 Malignant neoplasm of upper lobe, right bronchus or lung: Secondary | ICD-10-CM | POA: Insufficient documentation

## 2023-10-27 DIAGNOSIS — Z510 Encounter for antineoplastic radiation therapy: Secondary | ICD-10-CM | POA: Diagnosis not present

## 2023-10-27 DIAGNOSIS — C7931 Secondary malignant neoplasm of brain: Secondary | ICD-10-CM | POA: Diagnosis not present

## 2023-10-27 DIAGNOSIS — C778 Secondary and unspecified malignant neoplasm of lymph nodes of multiple regions: Secondary | ICD-10-CM | POA: Diagnosis not present

## 2023-10-30 NOTE — Progress Notes (Unsigned)
 [name] OFFICE PROGRESS NOTE  Brian Pen, MD [address] [place] [address]  DIAGNOSIS: Metastatic non-small cell lung cancer initially diagnosed as stage IIIB (T1c, N3, M0) non-small cell lung cancer, adenocarcinoma presented with right upper lobe lung mass in addition to mediastinal and bilateral supraclavicular lymphadenopathy diagnosed in January 2020.  He had evidence of disease recurrence in June 2021 with adenopathy in the subcarinal, level 3 left neck lymph node, and lymph node at the curious of the diaphragm and in the upper abdomen.   PD-L1: 10%   Guardant 360 molecular studies showed no actionable mutation   [employer] One Testing:   Biomarker Findings Microsatellite status - MS-Stable Tumor Mutational Burden - 4 Muts/Mb Genomic Findings For a complete list of the genes assayed, please refer to the Appendix. ALK EML4-ALK fusion (Variant 2) SMARCB1 R377C CTNNB1 S45P CDKN2A/B CDKN2B loss, CDKN2A loss CXCR4 W106* TP53 splice site 672G>A 7 Disease relevant genes with no reportable alterations: BRAF, EGFR, ERBB2, KRAS, MET, RET, ROS1  PRIOR THERAPY: 1) Concurrent chemoradiation with weekly carboplatin for AUC of 2 and paclitaxel 45 mg/M2.  Status post 6 cycles.  Last dose was given on October 10, 2018 with stable disease. 2) Radiation treatment to the enlarging right cervical lymph nodes under the care of Dr. Roselind Messier. First treatment 03/06/2019. Last treatment scheduled on 04/13/2019 3) Consolidation treatment with immunotherapy with Imfinzi 10 mg/KG every 2 weeks.  First dose November 17, 2018.  Status post 26 cycles. 4) Systemic chemotherapy with carboplatin for an AUC of 5, Alimta 500 mg/m2, and Keytruda 200 mg IV every 3 weeks. First dose expected on 02/14/20. Status post 1 cycle.  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 5) SBRT to the left lower lobe lung  nodule. 6) [name] ([name]) 600 mg p.o. twice daily.  He started the first dose on March 08, 2020.   Status post 44 months of treatment.  Discontinued in March 2025 due to disease progression  CURRENT THERAPY:  1) [name] (lorlatinib) 100 mg daily.  First dose on 10/20/2023. However, he held this after one dose due to side effects. He will restart this on 11/04/23.  2) radiation to the bilateral cervical lymph nodes under the care of Dr. Mitzi Hansen last day expected on 11/17/2023  INTERVAL HISTORY: Brian [name] 63 y.o. male returns to the clinic today for a follow-up visit accompanied by his wife.  In summary, the patient was last seen by Dr. Arbutus Ped on 10/13/2023. The patient has been followed for lung cancer which was diagnosed in January 2020.  He had been on alectinib since 2021 but was recently found to have disease progression.  Therefore, the alectinib was discontinued and he was started on [name] on 10/20/2023.   The patient started [name] around 10/20/2023.  However after taking 1 dose the patient became ill with nausea and vomiting approximately 3-4 times.  Therefore he stopped taking [name] and returned back to [name].  He is asking for clarification today the reasoning behind discontinuing [name].  The patient recently had a repeat PET scan which showed bilateral cervical lymphadenopathy for which the patient has seen Dr. Mitzi Hansen and Revonda Standard and he is scheduled to start radiation tomorrow.  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  He finished PT and OT without significant improvement.  The patient does have a cervical stimulator in his neck which  he states helps.  He also has hoarseness in his voice which has been occurring for approximately 2 months.  He was seen by ENT who states this is secondary to prior radiation damage to the vocal cords.  He denied any more episodes of nausea or vomiting after holding his [name].  He also  denies any gastrointestinal viral illness and denies any diarrhea.  He did have some constipation in the interval which typically only happens once every few months.  He denies any fever, chills, or night sweats.  He is gradually been losing weight over the last few months.  He is not eating as much due to being a more difficult time feeding himself due to the weakness in his right upper extremity.  Guarding his shortness of breath, he states that sometimes his shortness of breath and chest does feel tight.  He does have a rescue inhaler which is effective.  He may intermittently have a cough but Robitussin is effective.  He denies any hemoptysis or chest pain.  He denies any rashes or skin changes.  He reports a few headaches here and there.  He was post to have his routine brain MRI in March 2025 but he is having a difficult time laying down due to it affecting his breathing.  He will talk to radiation about alternatives.  He is here today for evaluation and repeat blood work.    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  MEDICATIONS:  Current Outpatient Medications  Medication Sig Dispense Refill   ondansetron (ZOFRAN) 8 MG tablet Take 1 tablet (8 mg total) by mouth every 8 (eight) hours as needed for nausea or vomiting. 30 tablet  0   prochlorperazine (COMPAZINE) 10 MG tablet Take 1 tablet (10 mg total) by mouth every 6 (six) hours as needed. 30 tablet 2   acetaminophen (TYLENOL) 500 MG tablet Take 1,000 mg by mouth every 6 (six) hours as needed (pain.).     albuterol (VENTOLIN HFA) 108 (90 Base) MCG/ACT inhaler INHALE 2 PUFFS INTO THE LUNGS EVERY 4 (FOUR) HOURS AS NEEDED FOR WHEEZING OR SHORTNESS OF BREATH. 6.7 each 4   amitriptyline (ELAVIL) 25 MG tablet Take 3 tablets (75 mg total) by mouth at bedtime. 90 tablet 0   amLODipine (NORVASC) 5 MG tablet Take 5 mg by mouth in the morning.     atorvastatin (LIPITOR) 20 MG tablet Take 20 mg by mouth in the morning. (Patient not taking: Reported on 10/21/2023)     baclofen (LIORESAL) 10 MG tablet Take 10 mg by mouth daily.     cyclobenzaprine (FLEXERIL) 5 MG tablet Take 1 tablet (5 mg total) by mouth 3 (three) times daily as needed for muscle spasms. (Patient not taking: Reported on  10/21/2023) 30 tablet 0   DULoxetine (CYMBALTA) 60 MG capsule Take 1 capsule (60 mg total) by mouth daily. 90 capsule 3   levothyroxine (SYNTHROID) 100 MCG tablet Take 100 mcg by mouth daily before breakfast.     LORazepam (ATIVAN) 0.5 MG tablet 1 tab po 30 minutes prior to radiation appointments 20 tablet 0   lorlatinib ([name]) 100 MG tablet Take 1 tablet (100 mg total) by mouth daily. Swallow tablets whole. Do not chew, crush or split tablets. 30 tablet 2   mirtazapine (REMERON) 15 MG tablet Take 1 tablet (15 mg total) by mouth at bedtime. (Patient not taking: Reported on 10/21/2023) 30 tablet 2   mupirocin ointment (BACTROBAN) 2 % Place 1 Application into the nose 2 (two) times daily for 60 doses. Use as directed 2 times daily for 5 days every other week for 6 weeks. (Patient not taking: Reported on 10/21/2023) 60 g 0   oxyCODONE (ROXICODONE) 5 MG immediate release tablet Take 1 tablet (5 mg total) by mouth every 4 (four) hours as needed for severe pain (pain score 7-10). (Patient not taking: Reported  on 10/21/2023) 30 tablet 0   PREDNISONE PO Take by mouth. Unsure of dosage; ordered by Dr. Andee Poles for hoarseness for 6 days (Patient not taking: Reported on 10/21/2023)     TRELEGY ELLIPTA 100-62.5-25 MCG/INH AEPB Inhale 1 puff into the lungs in the morning.     venlafaxine (EFFEXOR) 37.5 MG tablet Take 37.5 mg by mouth in the morning.     No current facility-administered medications for this visit.    SURGICAL HISTORY:  Past Surgical History:  Procedure Laterality Date   COLONOSCOPY     FEMUR FRACTURE SURGERY Left 1969   INGUINAL HERNIA REPAIR Right 06/24/2020   Procedure: RIGHT OPEN INGUINAL HERNIA REPAIR WITH MESH;  Surgeon: Kinsinger, De Blanch, MD;  Location: [facility];  Service: General;  Laterality: Right;   IR IVC FILTER PLMT / S&I Lenise Arena GUID/MOD SED  03/04/2020   IR IVC FILTER RETRIEVAL / S&I Lenise Arena GUID/MOD SED  08/15/2020   IR RADIOLOGIST EVAL & MGMT  08/06/2020   SPINAL CORD STIMULATOR INSERTION N/A 10/05/2023   Procedure: CERVICAL SPINAL CORD STIMULATOR PLACEMENT;  Surgeon: Lovenia Kim, MD;  Location: [facility] ORS;  Service: Neurosurgery;  Laterality: N/A;    REVIEW OF SYSTEMS:   Review of Systems  Constitutional: Positive for fatigue, decreased appetite, and weight loss.  Negative for chills and fever.  HENT: Positive for hoarseness. Negative for mouth sores, nosebleeds, sore throat and trouble swallowing.   Eyes: Negative for eye problems and icterus.  Respiratory: Positive for occasional dyspnea on exertion and intermittent cough. Negative for hemoptysis and wheezing.   Cardiovascular: Negative for chest pain and leg swelling.  Gastrointestinal: Constipation recently.  Nausea and vomiting with [name] negative for abdominal pain and diarrhea.  Genitourinary: Negative for bladder incontinence, difficulty urinating, dysuria, frequency and hematuria.   Musculoskeletal: Negative for back pain, gait problem, neck pain and neck stiffness.  Skin: Negative for itching and rash.   Neurological: Occasional headaches. Negative for dizziness, extremity weakness, gait problem, light-headedness and seizures.  Hematological: Negative for adenopathy. Does not bruise/bleed easily.  Psychiatric/Behavioral: Negative for confusion, depression and sleep disturbance. The patient is not nervous/anxious.     PHYSICAL EXAMINATION:  Blood pressure 117/85, pulse 93, temperature (!) 97 F (36.1 C), temperature source Temporal, resp. rate 16, weight 152 lb 14.4 oz (69.4 kg), SpO2 98%.  ECOG PERFORMANCE STATUS: 1  Physical Exam  Constitutional:  Oriented to person, place, and time and well-developed, well-nourished, and in no distress.  HENT:  Head: Normocephalic and atraumatic.  Mouth/Throat: Positive for hoarseness. Oropharynx is clear and moist. No oropharyngeal exudate.  Eyes: Conjunctivae are normal. Right eye exhibits no discharge. Left eye exhibits no discharge. No scleral icterus.  Neck: Normal range of motion. Neck supple.  Cardiovascular: Normal rate, regular rhythm, normal heart sounds and intact distal pulses.   Pulmonary/Chest: Effort normal and breath sounds normal. No respiratory distress. No wheezes. No rales.  Abdominal: Soft. Bowel sounds are normal. Exhibits no distension and no mass. There is no tenderness.  Musculoskeletal: Flaccid right upper extremity. Exhibits no edema.  Lymphadenopathy:    Bilateral cervical lymphadenopathy.  Neurological: Alert and oriented to person, place, and time. Exhibits normal muscle tone. Gait normal. Coordination normal.  Skin: Skin is warm and dry. No rash noted. Not diaphoretic. No erythema. No pallor.  Psychiatric: Mood, memory and judgment normal.  Vitals reviewed.  LABORATORY DATA: Lab Results  Component Value Date   WBC 14.0 (H) 11/03/2023   HGB 10.7 (L) 11/03/2023   HCT 31.2 (L) 11/03/2023   MCV 86.4 11/03/2023   PLT 422 (H) 11/03/2023      Chemistry      Component Value Date/Time   NA 140 11/03/2023 1411   K  3.5 11/03/2023 1411   CL 103 11/03/2023 1411   CO2 32 11/03/2023 1411   BUN 20 11/03/2023 1411   CREATININE 0.77 11/03/2023 1411      Component Value Date/Time   CALCIUM 9.1 11/03/2023 1411   ALKPHOS 237 (H) 11/03/2023 1411   AST 22 11/03/2023 1411   ALT 20 11/03/2023 1411   BILITOT 1.2 11/03/2023 1411       RADIOGRAPHIC STUDIES:  NM PET Image Restage (PS) Skull Base to Thigh (F-18 FDG) Result Date: 10/26/2023 CLINICAL DATA:  Subsequent treatment strategy for non-small cell lung cancer. EXAM: NUCLEAR MEDICINE PET SKULL BASE TO THIGH TECHNIQUE: 7.6 mCi F-18 FDG was injected intravenously. Full-ring PET imaging was performed from the skull base to thigh after the radiotracer. CT data was obtained and used for attenuation correction and anatomic localization. Fasting blood glucose: 103 mg/dl COMPARISON:  16/05/9603 FINDINGS: NECK: Bilateral intensely hypermetabolic lymph nodes in the lower neck/upper thoracic inlet. For example lymph nodes the posterior lower LEFT neck measuring 9 mm and 15 mm on image 27/4 have intense metabolic activity SUV max equal 13.9 and SUV max equalc 7.6. Hypermetabolic RIGHT supraclavicular node measures 21 mm on image 31 with SUV max equal 12.8. Incidental CT findings: None. CHEST: Hypermetabolic lymph node inferior to the carina adjacent to the esophagus and RIGHT mainstem bronchus SUV max equal 23.8 on image 37. This node is difficult to place on noncontrast CT. No hypermetabolic pulmonary nodules. Large layering RIGHT pleural effusion. Incidental CT findings: Stimulator pack in the posteriorly LEFT back with electrodes extending into the posterior cervical region. ABDOMEN/PELVIS: No abnormal hypermetabolic activity within the liver, pancreas, adrenal glands, or spleen. No hypermetabolic lymph nodes in the abdomen or pelvis. Incidental CT findings: None. SKELETON: No focal hypermetabolic activity to suggest skeletal metastasis. Incidental CT findings: None. IMPRESSION:  1. Bilateral hypermetabolic lymph nodes in the lower neck/upper thoracic inlet consistent with nodal metastasis. 2. Hypermetabolic lymph node inferior to the carina consistent with nodal metastasis. 3. No lung nodules. 4. Large layering RIGHT pleural effusion. Electronically Signed   By: Genevive Bi M.D.   On: 10/26/2023 10:53   DG Cervical Spine 2-3 Views Result  Date: 10/05/2023 CLINICAL DATA:  Elective surgery.  Cervical spine stimulator. EXAM: CERVICAL SPINE - 2-3 VIEW COMPARISON:  None Available. FINDINGS: Two fluoroscopic spot views of the cervical spine submitted from the operating room. Spinal stimulator with lead tip posterior at the C3-C4 level. Fluoroscopy time 2 seconds. Dose 0.35 mGy IMPRESSION: Intraoperative fluoroscopy during cervical spine stimulator placement. Electronically Signed   By: Narda Rutherford M.D.   On: 10/05/2023 16:12   DG C-Arm 1-60 Min-No Report Result Date: 10/05/2023 Fluoroscopy was utilized by the requesting physician.  No radiographic interpretation.   DG C-Arm 1-60 Min-No Report Result Date: 10/05/2023 Fluoroscopy was utilized by the requesting physician.  No radiographic interpretation.     ASSESSMENT/PLAN:  This is a very pleasant 63 year old Caucasian male with recurrent lung cancer initially diagnosed with stage IIIb non-small cell lung cancer, adenocarcinoma.  He presented with a right upper lobe lung mass in addition to mediastinal and bilateral supraclavicular lymphadenopathy.  He was diagnosed in January 2020.  His PDL 1 expression is 10% and he has no actionable mutations.   He underwent a course of concurrent chemoradiation with weekly carboplatin and paclitaxel. He is status post 6 cycles.  He tolerated well except for fatigue and odynophagia.    He completed his 26 cycles of consolidation immunotherapy with Imfinzi 10 mg/kg IV every 2 weeks. He tolerated it well without any adverse side effects except has a very mild skin rash over his right  cervical area. He completed radiation to the enlarging right cervical lymph nodes. His last radiation treatment was on 04/13/2019.   He received 1 cycle of systemic chemotherapy with carboplatin, Alimta and Keytruda. Repeat tissue biopsy from the left supraclavicular lymph nodes and molecular studies showed that the patient has positive ALK gene translocation. His previous molecular studies by [employer]ant 360 was negative. Dr. Arbutus Ped discontinue his systemic chemotherapy for now because of the new findings on the molecular studies.   The patient has been on treatment with [name] 600 mg p.o. twice daily.  He is status post 43 months of treatment.   He unfortunately was recently found to have evidence of disease progression and his treatment was switched to 100 mg of [name] p.o. daily.  He started this on 10/20/2023 but has held his treatment after 1 dose due to nausea and vomiting.  He is also currently following with radiation oncology who is currently undergoing with palliative radiation to the cervical lymph nodes.  Is expected to start this tomorrow.   The patient was seen with Dr. Arbutus Ped today who reviewed his PET scan from a few weeks ago.  Dr. Arbutus Ped explained the reasoning behind discontinuing his [name] and starting [name] to the disease progression and [name] having efficacy on some resistant mutations.  Therefore, the patient will try and resume taking his [name] tomorrow.  I have sent him antiemetics with Zofran and Compazine that he can alternate.  He was advised to take an antiemetic prior to taking his [name] to see if this helps.  However if he has any trouble he is advised to call us sooner.  If needed, we may have to revisit reducing the dose of 75 mg.  We will see the patient back for labs and a follow-up visit in 2 weeks.  The patient will continue to take his medication with food to help with GI upset.  He will continue to follow with his neurosurgeon.    Review constipation education.  Discussed that if it is more than a day  or 2 without having a bowel movement he was advised to take a laxative.  We also discussed fruits and vegetables, drinking plenty of water, and being as active as possible helps reduce the risk of constipation.  When he sees Dr. Mitzi Hansen next time he will talk about his brain MRI that he missed and see if this needs to be rescheduled or if the patient needs to have alternative neuroimaging studies due to difficulties with MRIs.   The patient was advised to call immediately if he has any concerning symptoms in the interval. The patient voices understanding of current disease status and treatment options and is in agreement with the current care plan. All questions were answered. The patient knows to call the clinic with any problems, questions or concerns. We can certainly see the patient much sooner if necessary      Orders Placed This Encounter  Procedures   Lipid panel    Standing Status:   Future    Expected Date:   11/17/2023    Expiration Date:   11/02/2024   CBC with Differential (Cancer Center Only)    Standing Status:   Future    Expected Date:   11/17/2023    Expiration Date:   11/02/2024   CMP (Cancer Center only)    Standing Status:   Future    Expected Date:   11/17/2023    Expiration Date:   11/02/2024      Alexius Ellington L Meher Kucinski, PA-C 11/03/23  ADDENDUM: Hematology/Oncology Attending: I had a face-to-face encounter with the patient today.  I reviewed his record, lab, recent PET scan and recommended his care plan.  This is a very pleasant 63 years old white male with metastatic non-small cell lung cancer, adenocarcinoma that was initially diagnosed as stage IIIb in January 2020 with evidence of disease recurrence and metastasis in June 2021.  His molecular studies showed positive ALK gene translocation and the patient started on treatment with [name] ([name]) 600 mg p.o. twice daily between August  2021 until March 2025 when it was discontinued secondary to disease progression especially on the bilateral cervical and supraclavicular lymphadenopathy. He had a PET scan that showed extensive lymphadenopathy in the neck and upper mediastinum. I switched his treatment to [name] 100 mg p.o. daily but the patient took only 1 dose and because of nausea he decided not to take it and returning back to taking [name] ([name]) for the last few weeks. I had a lengthy discussion with the patient and his wife about his condition and I showed them the images of the PET scan.  I strongly recommend for the patient to resume his treatment with [name] with the same dose after taking antiemetics and if no improvement and the toxicity, we may consider reducing the dose to 75 mg p.o. daily. We will see the patient back for follow-up visit in 2 weeks for evaluation and repeat blood work. He was advised to call immediately if he has any other concerning symptoms in the interval. The total time spent in the appointment was 30 minutes. Disclaimer: This note was dictated with voice recognition software. Similar sounding words can inadvertently be transcribed and may be missed upon review. Lajuana Matte, MD

## 2023-11-02 ENCOUNTER — Ambulatory Visit
Admission: RE | Admit: 2023-11-02 | Discharge: 2023-11-02 | Disposition: A | Discharge status: Home / Self Care | Source: Ambulatory Visit | Attending: Radiation Oncology | Admitting: Radiation Oncology

## 2023-11-02 DIAGNOSIS — R918 Other nonspecific abnormal finding of lung field: Secondary | ICD-10-CM | POA: Insufficient documentation

## 2023-11-03 ENCOUNTER — Other Ambulatory Visit

## 2023-11-03 ENCOUNTER — Inpatient Hospital Stay (HOSPITAL_BASED_OUTPATIENT_CLINIC_OR_DEPARTMENT_OTHER): Admitting: Physician Assistant

## 2023-11-03 ENCOUNTER — Ambulatory Visit: Admitting: Internal Medicine

## 2023-11-03 ENCOUNTER — Inpatient Hospital Stay: Attending: Internal Medicine

## 2023-11-03 ENCOUNTER — Ambulatory Visit

## 2023-11-03 VITALS — BP 117/85 | HR 93 | Temp 97.0°F | Resp 16 | Wt 152.9 lb

## 2023-11-03 DIAGNOSIS — Z9221 Personal history of antineoplastic chemotherapy: Secondary | ICD-10-CM | POA: Insufficient documentation

## 2023-11-03 DIAGNOSIS — C770 Secondary and unspecified malignant neoplasm of lymph nodes of head, face and neck: Secondary | ICD-10-CM | POA: Insufficient documentation

## 2023-11-03 DIAGNOSIS — Z79899 Other long term (current) drug therapy: Secondary | ICD-10-CM | POA: Diagnosis not present

## 2023-11-03 DIAGNOSIS — Z9981 Dependence on supplemental oxygen: Secondary | ICD-10-CM | POA: Diagnosis not present

## 2023-11-03 DIAGNOSIS — C3411 Malignant neoplasm of upper lobe, right bronchus or lung: Secondary | ICD-10-CM | POA: Insufficient documentation

## 2023-11-03 DIAGNOSIS — C3491 Malignant neoplasm of unspecified part of right bronchus or lung: Secondary | ICD-10-CM | POA: Diagnosis not present

## 2023-11-03 DIAGNOSIS — Z923 Personal history of irradiation: Secondary | ICD-10-CM | POA: Insufficient documentation

## 2023-11-03 DIAGNOSIS — R918 Other nonspecific abnormal finding of lung field: Secondary | ICD-10-CM | POA: Diagnosis not present

## 2023-11-03 DIAGNOSIS — K5900 Constipation, unspecified: Secondary | ICD-10-CM | POA: Insufficient documentation

## 2023-11-03 DIAGNOSIS — C778 Secondary and unspecified malignant neoplasm of lymph nodes of multiple regions: Secondary | ICD-10-CM | POA: Diagnosis not present

## 2023-11-03 DIAGNOSIS — C7931 Secondary malignant neoplasm of brain: Secondary | ICD-10-CM | POA: Insufficient documentation

## 2023-11-03 LAB — CMP (CANCER CENTER ONLY)
ALT: 20 U/L (ref 0–44)
AST: 22 U/L (ref 15–41)
Albumin: 3.7 g/dL (ref 3.5–5.0)
Alkaline Phosphatase: 237 U/L — ABNORMAL HIGH (ref 38–126)
Anion gap: 5 (ref 5–15)
BUN: 20 mg/dL (ref 6–20)
CO2: 32 mmol/L (ref 22–32)
Calcium: 9.1 mg/dL (ref 8.9–10.3)
Chloride: 103 mmol/L (ref 98–111)
Creatinine: 0.77 mg/dL (ref 0.61–1.24)
GFR, Estimated: >60 mL/min (ref >60)
Glucose, Bld: 115 mg/dL — ABNORMAL HIGH (ref 70–99)
Potassium: 3.5 mmol/L (ref 3.5–5.1)
Sodium: 140 mmol/L (ref 135–145)
Total Bilirubin: 1.2 mg/dL (ref 0.0–1.2)
Total Protein: 6.4 g/dL — ABNORMAL LOW (ref 6.5–8.1)

## 2023-11-03 LAB — LIPID PANEL
Cholesterol: 139 mg/dL (ref 0–200)
HDL: 48 mg/dL (ref >40)
LDL Cholesterol: 69 mg/dL (ref 0–99)
Total CHOL/HDL Ratio: 2.9 ratio
Triglycerides: 110 mg/dL (ref <150)
VLDL: 22 mg/dL (ref 0–40)

## 2023-11-03 LAB — CBC WITH DIFFERENTIAL (CANCER CENTER ONLY)
Abs Immature Granulocytes: 0.07 10*3/uL (ref 0.00–0.07)
Basophils Absolute: 0.1 10*3/uL (ref 0.0–0.1)
Basophils Relative: 1 %
Eosinophils Absolute: 0.3 10*3/uL (ref 0.0–0.5)
Eosinophils Relative: 2 %
HCT: 31.2 % — ABNORMAL LOW (ref 39.0–52.0)
Hemoglobin: 10.7 g/dL — ABNORMAL LOW (ref 13.0–17.0)
Immature Granulocytes: 1 %
Lymphocytes Relative: 9 %
Lymphs Abs: 1.2 10*3/uL (ref 0.7–4.0)
MCH: 29.6 pg (ref 26.0–34.0)
MCHC: 34.3 g/dL (ref 30.0–36.0)
MCV: 86.4 fL (ref 80.0–100.0)
Monocytes Absolute: 1.9 10*3/uL — ABNORMAL HIGH (ref 0.1–1.0)
Monocytes Relative: 13 %
Neutro Abs: 10.4 10*3/uL — ABNORMAL HIGH (ref 1.7–7.7)
Neutrophils Relative %: 74 %
Platelet Count: 422 10*3/uL — ABNORMAL HIGH (ref 150–400)
RBC: 3.61 MIL/uL — ABNORMAL LOW (ref 4.22–5.81)
RDW: 16.7 % — ABNORMAL HIGH (ref 11.5–15.5)
WBC Count: 14 10*3/uL — ABNORMAL HIGH (ref 4.0–10.5)
nRBC: 0 % (ref 0.0–0.2)

## 2023-11-03 MED ORDER — ONDANSETRON HCL 8 MG PO TABS: 8.0000 mg | ORAL_TABLET | Freq: Three times a day (TID) | ORAL | 0 refills | Status: DC | PRN

## 2023-11-03 MED ORDER — PROCHLORPERAZINE MALEATE 10 MG PO TABS
10.0000 mg | ORAL_TABLET | Freq: Four times a day (QID) | ORAL | 2 refills | Status: DC | PRN
Start: 2023-11-03 — End: 2023-12-13

## 2023-11-04 ENCOUNTER — Other Ambulatory Visit: Payer: Self-pay

## 2023-11-04 ENCOUNTER — Telehealth: Payer: Self-pay | Admitting: Internal Medicine

## 2023-11-04 DIAGNOSIS — C3411 Malignant neoplasm of upper lobe, right bronchus or lung: Secondary | ICD-10-CM | POA: Diagnosis not present

## 2023-11-04 DIAGNOSIS — C778 Secondary and unspecified malignant neoplasm of lymph nodes of multiple regions: Secondary | ICD-10-CM | POA: Diagnosis not present

## 2023-11-04 DIAGNOSIS — Z510 Encounter for antineoplastic radiation therapy: Secondary | ICD-10-CM | POA: Diagnosis not present

## 2023-11-04 DIAGNOSIS — R918 Other nonspecific abnormal finding of lung field: Secondary | ICD-10-CM | POA: Diagnosis not present

## 2023-11-04 LAB — RAD ONC ARIA SESSION SUMMARY
Course Elapsed Days: 0
Plan Fractions Treated to Date: 1
Plan Prescribed Dose Per Fraction: 3 Gy
Plan Total Fractions Prescribed: 10
Plan Total Prescribed Dose: 30 Gy
Reference Point Dosage Given to Date: 3 Gy
Reference Point Session Dosage Given: 3 Gy
Session Number: 1

## 2023-11-04 NOTE — Telephone Encounter (Signed)
Left a voicemail with appointment details

## 2023-11-05 ENCOUNTER — Ambulatory Visit
Admission: RE | Admit: 2023-11-05 | Discharge: 2023-11-05 | Disposition: A | Discharge status: Home / Self Care | Source: Ambulatory Visit | Attending: Radiation Oncology | Admitting: Radiation Oncology

## 2023-11-05 ENCOUNTER — Other Ambulatory Visit: Payer: Self-pay

## 2023-11-05 DIAGNOSIS — C778 Secondary and unspecified malignant neoplasm of lymph nodes of multiple regions: Secondary | ICD-10-CM | POA: Diagnosis not present

## 2023-11-05 DIAGNOSIS — R918 Other nonspecific abnormal finding of lung field: Secondary | ICD-10-CM

## 2023-11-05 DIAGNOSIS — C3411 Malignant neoplasm of upper lobe, right bronchus or lung: Secondary | ICD-10-CM | POA: Diagnosis not present

## 2023-11-05 DIAGNOSIS — Z510 Encounter for antineoplastic radiation therapy: Secondary | ICD-10-CM | POA: Diagnosis not present

## 2023-11-05 LAB — RAD ONC ARIA SESSION SUMMARY
Course Elapsed Days: 1
Plan Fractions Treated to Date: 2
Plan Prescribed Dose Per Fraction: 3 Gy
Plan Total Fractions Prescribed: 10
Plan Total Prescribed Dose: 30 Gy
Reference Point Dosage Given to Date: 6 Gy
Reference Point Session Dosage Given: 3 Gy
Session Number: 2

## 2023-11-05 MED ORDER — SONAFINE EX EMUL
1.0000 | Freq: Once | CUTANEOUS | Status: AC
  Administered 2023-11-05: 1 via TOPICAL

## 2023-11-08 ENCOUNTER — Ambulatory Visit
Admission: RE | Admit: 2023-11-08 | Discharge: 2023-11-08 | Disposition: A | Discharge status: Home / Self Care | Source: Ambulatory Visit | Attending: Radiation Oncology | Admitting: Radiation Oncology

## 2023-11-08 ENCOUNTER — Other Ambulatory Visit: Payer: Self-pay

## 2023-11-08 DIAGNOSIS — C3411 Malignant neoplasm of upper lobe, right bronchus or lung: Secondary | ICD-10-CM | POA: Diagnosis not present

## 2023-11-08 DIAGNOSIS — R918 Other nonspecific abnormal finding of lung field: Secondary | ICD-10-CM | POA: Diagnosis not present

## 2023-11-08 DIAGNOSIS — Z510 Encounter for antineoplastic radiation therapy: Secondary | ICD-10-CM | POA: Diagnosis not present

## 2023-11-08 DIAGNOSIS — C778 Secondary and unspecified malignant neoplasm of lymph nodes of multiple regions: Secondary | ICD-10-CM | POA: Diagnosis not present

## 2023-11-08 LAB — RAD ONC ARIA SESSION SUMMARY
Course Elapsed Days: 4
Plan Fractions Treated to Date: 3
Plan Prescribed Dose Per Fraction: 3 Gy
Plan Total Fractions Prescribed: 10
Plan Total Prescribed Dose: 30 Gy
Reference Point Dosage Given to Date: 9 Gy
Reference Point Session Dosage Given: 3 Gy
Session Number: 3

## 2023-11-09 ENCOUNTER — Ambulatory Visit
Admission: RE | Admit: 2023-11-09 | Discharge: 2023-11-09 | Disposition: A | Discharge status: Home / Self Care | Source: Ambulatory Visit | Attending: Radiation Oncology | Admitting: Radiation Oncology

## 2023-11-09 ENCOUNTER — Other Ambulatory Visit: Payer: Self-pay

## 2023-11-09 DIAGNOSIS — Z510 Encounter for antineoplastic radiation therapy: Secondary | ICD-10-CM | POA: Diagnosis not present

## 2023-11-09 DIAGNOSIS — C778 Secondary and unspecified malignant neoplasm of lymph nodes of multiple regions: Secondary | ICD-10-CM | POA: Diagnosis not present

## 2023-11-09 DIAGNOSIS — C3411 Malignant neoplasm of upper lobe, right bronchus or lung: Secondary | ICD-10-CM | POA: Diagnosis not present

## 2023-11-09 DIAGNOSIS — R918 Other nonspecific abnormal finding of lung field: Secondary | ICD-10-CM | POA: Diagnosis not present

## 2023-11-09 LAB — RAD ONC ARIA SESSION SUMMARY
Course Elapsed Days: 5
Plan Fractions Treated to Date: 4
Plan Prescribed Dose Per Fraction: 3 Gy
Plan Total Fractions Prescribed: 10
Plan Total Prescribed Dose: 30 Gy
Reference Point Dosage Given to Date: 12 Gy
Reference Point Session Dosage Given: 3 Gy
Session Number: 4

## 2023-11-10 ENCOUNTER — Other Ambulatory Visit: Payer: Self-pay

## 2023-11-10 ENCOUNTER — Encounter: Payer: Self-pay | Admitting: Neurosurgery

## 2023-11-10 ENCOUNTER — Ambulatory Visit
Admission: RE | Admit: 2023-11-10 | Discharge: 2023-11-10 | Disposition: A | Discharge status: Home / Self Care | Source: Ambulatory Visit | Attending: Radiation Oncology | Admitting: Radiation Oncology

## 2023-11-10 DIAGNOSIS — Z510 Encounter for antineoplastic radiation therapy: Secondary | ICD-10-CM | POA: Diagnosis not present

## 2023-11-10 DIAGNOSIS — C3411 Malignant neoplasm of upper lobe, right bronchus or lung: Secondary | ICD-10-CM | POA: Diagnosis not present

## 2023-11-10 DIAGNOSIS — C778 Secondary and unspecified malignant neoplasm of lymph nodes of multiple regions: Secondary | ICD-10-CM | POA: Diagnosis not present

## 2023-11-10 DIAGNOSIS — R918 Other nonspecific abnormal finding of lung field: Secondary | ICD-10-CM | POA: Diagnosis not present

## 2023-11-10 LAB — RAD ONC ARIA SESSION SUMMARY
Course Elapsed Days: 6
Plan Fractions Treated to Date: 5
Plan Prescribed Dose Per Fraction: 3 Gy
Plan Total Fractions Prescribed: 10
Plan Total Prescribed Dose: 30 Gy
Reference Point Dosage Given to Date: 15 Gy
Reference Point Session Dosage Given: 3 Gy
Session Number: 5

## 2023-11-11 ENCOUNTER — Ambulatory Visit: Admission: RE | Admit: 2023-11-11 | Source: Ambulatory Visit

## 2023-11-12 ENCOUNTER — Other Ambulatory Visit: Payer: Self-pay

## 2023-11-12 ENCOUNTER — Ambulatory Visit
Admission: RE | Admit: 2023-11-12 | Discharge: 2023-11-12 | Disposition: A | Discharge status: Home / Self Care | Source: Ambulatory Visit | Attending: Radiation Oncology | Admitting: Radiation Oncology

## 2023-11-12 DIAGNOSIS — R918 Other nonspecific abnormal finding of lung field: Secondary | ICD-10-CM | POA: Diagnosis not present

## 2023-11-12 DIAGNOSIS — Z510 Encounter for antineoplastic radiation therapy: Secondary | ICD-10-CM | POA: Diagnosis not present

## 2023-11-12 DIAGNOSIS — C778 Secondary and unspecified malignant neoplasm of lymph nodes of multiple regions: Secondary | ICD-10-CM | POA: Diagnosis not present

## 2023-11-12 DIAGNOSIS — C3411 Malignant neoplasm of upper lobe, right bronchus or lung: Secondary | ICD-10-CM | POA: Diagnosis not present

## 2023-11-12 LAB — RAD ONC ARIA SESSION SUMMARY
Course Elapsed Days: 8
Plan Fractions Treated to Date: 6
Plan Prescribed Dose Per Fraction: 3 Gy
Plan Total Fractions Prescribed: 10
Plan Total Prescribed Dose: 30 Gy
Reference Point Dosage Given to Date: 18 Gy
Reference Point Session Dosage Given: 3 Gy
Session Number: 6

## 2023-11-15 ENCOUNTER — Encounter: Payer: BC Managed Care – PPO | Admitting: Neurosurgery

## 2023-11-15 ENCOUNTER — Other Ambulatory Visit: Payer: Self-pay

## 2023-11-15 ENCOUNTER — Ambulatory Visit
Admission: RE | Admit: 2023-11-15 | Discharge: 2023-11-15 | Disposition: A | Discharge status: Home / Self Care | Source: Ambulatory Visit | Attending: Radiation Oncology | Admitting: Radiation Oncology

## 2023-11-15 DIAGNOSIS — G629 Polyneuropathy, unspecified: Secondary | ICD-10-CM | POA: Diagnosis not present

## 2023-11-15 DIAGNOSIS — R442 Other hallucinations: Secondary | ICD-10-CM | POA: Diagnosis not present

## 2023-11-15 DIAGNOSIS — R2681 Unsteadiness on feet: Secondary | ICD-10-CM | POA: Diagnosis not present

## 2023-11-15 DIAGNOSIS — Z85118 Personal history of other malignant neoplasm of bronchus and lung: Secondary | ICD-10-CM | POA: Diagnosis not present

## 2023-11-15 DIAGNOSIS — C3411 Malignant neoplasm of upper lobe, right bronchus or lung: Secondary | ICD-10-CM | POA: Diagnosis not present

## 2023-11-15 DIAGNOSIS — C7931 Secondary malignant neoplasm of brain: Secondary | ICD-10-CM | POA: Diagnosis not present

## 2023-11-15 DIAGNOSIS — F40240 Claustrophobia: Secondary | ICD-10-CM | POA: Diagnosis present

## 2023-11-15 DIAGNOSIS — C770 Secondary and unspecified malignant neoplasm of lymph nodes of head, face and neck: Secondary | ICD-10-CM | POA: Diagnosis not present

## 2023-11-15 DIAGNOSIS — R443 Hallucinations, unspecified: Secondary | ICD-10-CM | POA: Diagnosis not present

## 2023-11-15 DIAGNOSIS — J9811 Atelectasis: Secondary | ICD-10-CM | POA: Diagnosis not present

## 2023-11-15 DIAGNOSIS — E785 Hyperlipidemia, unspecified: Secondary | ICD-10-CM | POA: Diagnosis present

## 2023-11-15 DIAGNOSIS — E039 Hypothyroidism, unspecified: Secondary | ICD-10-CM | POA: Diagnosis not present

## 2023-11-15 DIAGNOSIS — J90 Pleural effusion, not elsewhere classified: Secondary | ICD-10-CM | POA: Diagnosis not present

## 2023-11-15 DIAGNOSIS — C3490 Malignant neoplasm of unspecified part of unspecified bronchus or lung: Secondary | ICD-10-CM | POA: Diagnosis not present

## 2023-11-15 DIAGNOSIS — E1169 Type 2 diabetes mellitus with other specified complication: Secondary | ICD-10-CM | POA: Diagnosis not present

## 2023-11-15 DIAGNOSIS — C7949 Secondary malignant neoplasm of other parts of nervous system: Secondary | ICD-10-CM | POA: Diagnosis not present

## 2023-11-15 DIAGNOSIS — C3491 Malignant neoplasm of unspecified part of right bronchus or lung: Secondary | ICD-10-CM | POA: Diagnosis not present

## 2023-11-15 DIAGNOSIS — C7989 Secondary malignant neoplasm of other specified sites: Secondary | ICD-10-CM | POA: Diagnosis not present

## 2023-11-15 DIAGNOSIS — D649 Anemia, unspecified: Secondary | ICD-10-CM | POA: Diagnosis not present

## 2023-11-15 DIAGNOSIS — E876 Hypokalemia: Secondary | ICD-10-CM | POA: Diagnosis present

## 2023-11-15 DIAGNOSIS — R000 Tachycardia, unspecified: Secondary | ICD-10-CM | POA: Diagnosis not present

## 2023-11-15 DIAGNOSIS — G928 Other toxic encephalopathy: Secondary | ICD-10-CM | POA: Diagnosis not present

## 2023-11-15 DIAGNOSIS — J910 Malignant pleural effusion: Secondary | ICD-10-CM | POA: Diagnosis not present

## 2023-11-15 DIAGNOSIS — Z48813 Encounter for surgical aftercare following surgery on the respiratory system: Secondary | ICD-10-CM | POA: Diagnosis not present

## 2023-11-15 DIAGNOSIS — R531 Weakness: Secondary | ICD-10-CM | POA: Diagnosis not present

## 2023-11-15 DIAGNOSIS — Y842 Radiological procedure and radiotherapy as the cause of abnormal reaction of the patient, or of later complication, without mention of misadventure at the time of the procedure: Secondary | ICD-10-CM | POA: Diagnosis present

## 2023-11-15 DIAGNOSIS — J432 Centrilobular emphysema: Secondary | ICD-10-CM | POA: Diagnosis present

## 2023-11-15 DIAGNOSIS — Z510 Encounter for antineoplastic radiation therapy: Secondary | ICD-10-CM | POA: Diagnosis not present

## 2023-11-15 DIAGNOSIS — D630 Anemia in neoplastic disease: Secondary | ICD-10-CM | POA: Diagnosis not present

## 2023-11-15 DIAGNOSIS — R918 Other nonspecific abnormal finding of lung field: Secondary | ICD-10-CM | POA: Diagnosis not present

## 2023-11-15 DIAGNOSIS — Z7189 Other specified counseling: Secondary | ICD-10-CM | POA: Diagnosis not present

## 2023-11-15 DIAGNOSIS — J189 Pneumonia, unspecified organism: Secondary | ICD-10-CM | POA: Diagnosis not present

## 2023-11-15 DIAGNOSIS — G894 Chronic pain syndrome: Secondary | ICD-10-CM | POA: Diagnosis present

## 2023-11-15 DIAGNOSIS — G90511 Complex regional pain syndrome I of right upper limb: Secondary | ICD-10-CM | POA: Diagnosis present

## 2023-11-15 DIAGNOSIS — G90522 Complex regional pain syndrome I of left lower limb: Secondary | ICD-10-CM | POA: Diagnosis present

## 2023-11-15 DIAGNOSIS — C778 Secondary and unspecified malignant neoplasm of lymph nodes of multiple regions: Secondary | ICD-10-CM | POA: Diagnosis not present

## 2023-11-15 DIAGNOSIS — R410 Disorientation, unspecified: Secondary | ICD-10-CM | POA: Diagnosis not present

## 2023-11-15 DIAGNOSIS — D72829 Elevated white blood cell count, unspecified: Secondary | ICD-10-CM | POA: Diagnosis not present

## 2023-11-15 DIAGNOSIS — G540 Brachial plexus disorders: Secondary | ICD-10-CM | POA: Diagnosis present

## 2023-11-15 DIAGNOSIS — J9601 Acute respiratory failure with hypoxia: Secondary | ICD-10-CM | POA: Diagnosis not present

## 2023-11-15 DIAGNOSIS — R4182 Altered mental status, unspecified: Secondary | ICD-10-CM | POA: Diagnosis not present

## 2023-11-15 DIAGNOSIS — Z515 Encounter for palliative care: Secondary | ICD-10-CM | POA: Diagnosis not present

## 2023-11-15 DIAGNOSIS — J440 Chronic obstructive pulmonary disease with acute lower respiratory infection: Secondary | ICD-10-CM | POA: Diagnosis not present

## 2023-11-15 DIAGNOSIS — M7989 Other specified soft tissue disorders: Secondary | ICD-10-CM | POA: Diagnosis not present

## 2023-11-15 DIAGNOSIS — J168 Pneumonia due to other specified infectious organisms: Secondary | ICD-10-CM | POA: Diagnosis not present

## 2023-11-15 DIAGNOSIS — I10 Essential (primary) hypertension: Secondary | ICD-10-CM | POA: Diagnosis not present

## 2023-11-15 LAB — RAD ONC ARIA SESSION SUMMARY
Course Elapsed Days: 11
Plan Fractions Treated to Date: 7
Plan Prescribed Dose Per Fraction: 3 Gy
Plan Total Fractions Prescribed: 10
Plan Total Prescribed Dose: 30 Gy
Reference Point Dosage Given to Date: 21 Gy
Reference Point Session Dosage Given: 3 Gy
Session Number: 7

## 2023-11-16 ENCOUNTER — Other Ambulatory Visit: Payer: Self-pay

## 2023-11-16 ENCOUNTER — Ambulatory Visit
Admission: RE | Admit: 2023-11-16 | Discharge: 2023-11-16 | Disposition: A | Discharge status: Home / Self Care | Source: Ambulatory Visit | Attending: Radiation Oncology | Admitting: Radiation Oncology

## 2023-11-16 ENCOUNTER — Emergency Department (HOSPITAL_COMMUNITY)

## 2023-11-16 ENCOUNTER — Encounter (HOSPITAL_COMMUNITY): Payer: Self-pay | Admitting: Radiology

## 2023-11-16 ENCOUNTER — Inpatient Hospital Stay (HOSPITAL_COMMUNITY)

## 2023-11-16 ENCOUNTER — Inpatient Hospital Stay (HOSPITAL_COMMUNITY)
Admission: EM | Admit: 2023-11-16 | Discharge: 2023-11-22 | DRG: 193 | Disposition: A | Discharge status: Home / Self Care | Attending: Internal Medicine | Admitting: Internal Medicine

## 2023-11-16 DIAGNOSIS — C3490 Malignant neoplasm of unspecified part of unspecified bronchus or lung: Secondary | ICD-10-CM | POA: Diagnosis not present

## 2023-11-16 DIAGNOSIS — D649 Anemia, unspecified: Secondary | ICD-10-CM | POA: Diagnosis not present

## 2023-11-16 DIAGNOSIS — Z515 Encounter for palliative care: Secondary | ICD-10-CM | POA: Diagnosis not present

## 2023-11-16 DIAGNOSIS — Z87442 Personal history of urinary calculi: Secondary | ICD-10-CM

## 2023-11-16 DIAGNOSIS — C7931 Secondary malignant neoplasm of brain: Secondary | ICD-10-CM | POA: Diagnosis present

## 2023-11-16 DIAGNOSIS — C3491 Malignant neoplasm of unspecified part of right bronchus or lung: Secondary | ICD-10-CM | POA: Diagnosis not present

## 2023-11-16 DIAGNOSIS — Z9682 Presence of neurostimulator: Secondary | ICD-10-CM

## 2023-11-16 DIAGNOSIS — C7989 Secondary malignant neoplasm of other specified sites: Secondary | ICD-10-CM | POA: Diagnosis present

## 2023-11-16 DIAGNOSIS — E785 Hyperlipidemia, unspecified: Secondary | ICD-10-CM | POA: Diagnosis present

## 2023-11-16 DIAGNOSIS — C3411 Malignant neoplasm of upper lobe, right bronchus or lung: Secondary | ICD-10-CM | POA: Diagnosis present

## 2023-11-16 DIAGNOSIS — G540 Brachial plexus disorders: Secondary | ICD-10-CM | POA: Diagnosis present

## 2023-11-16 DIAGNOSIS — E876 Hypokalemia: Secondary | ICD-10-CM | POA: Diagnosis present

## 2023-11-16 DIAGNOSIS — Y842 Radiological procedure and radiotherapy as the cause of abnormal reaction of the patient, or of later complication, without mention of misadventure at the time of the procedure: Secondary | ICD-10-CM | POA: Diagnosis present

## 2023-11-16 DIAGNOSIS — F410 Panic disorder [episodic paroxysmal anxiety] without agoraphobia: Secondary | ICD-10-CM | POA: Diagnosis present

## 2023-11-16 DIAGNOSIS — F40240 Claustrophobia: Secondary | ICD-10-CM | POA: Diagnosis present

## 2023-11-16 DIAGNOSIS — D630 Anemia in neoplastic disease: Secondary | ICD-10-CM | POA: Diagnosis present

## 2023-11-16 DIAGNOSIS — Z85118 Personal history of other malignant neoplasm of bronchus and lung: Secondary | ICD-10-CM

## 2023-11-16 DIAGNOSIS — I10 Essential (primary) hypertension: Secondary | ICD-10-CM | POA: Diagnosis present

## 2023-11-16 DIAGNOSIS — J90 Pleural effusion, not elsewhere classified: Secondary | ICD-10-CM | POA: Diagnosis present

## 2023-11-16 DIAGNOSIS — Z8701 Personal history of pneumonia (recurrent): Secondary | ICD-10-CM

## 2023-11-16 DIAGNOSIS — Z7189 Other specified counseling: Secondary | ICD-10-CM | POA: Diagnosis not present

## 2023-11-16 DIAGNOSIS — G894 Chronic pain syndrome: Secondary | ICD-10-CM | POA: Diagnosis present

## 2023-11-16 DIAGNOSIS — J9601 Acute respiratory failure with hypoxia: Secondary | ICD-10-CM | POA: Diagnosis present

## 2023-11-16 DIAGNOSIS — M7989 Other specified soft tissue disorders: Secondary | ICD-10-CM | POA: Diagnosis not present

## 2023-11-16 DIAGNOSIS — Z833 Family history of diabetes mellitus: Secondary | ICD-10-CM

## 2023-11-16 DIAGNOSIS — Z923 Personal history of irradiation: Secondary | ICD-10-CM

## 2023-11-16 DIAGNOSIS — E1169 Type 2 diabetes mellitus with other specified complication: Secondary | ICD-10-CM | POA: Diagnosis present

## 2023-11-16 DIAGNOSIS — C778 Secondary and unspecified malignant neoplasm of lymph nodes of multiple regions: Secondary | ICD-10-CM | POA: Diagnosis not present

## 2023-11-16 DIAGNOSIS — Z9221 Personal history of antineoplastic chemotherapy: Secondary | ICD-10-CM

## 2023-11-16 DIAGNOSIS — E039 Hypothyroidism, unspecified: Secondary | ICD-10-CM | POA: Diagnosis present

## 2023-11-16 DIAGNOSIS — J189 Pneumonia, unspecified organism: Principal | ICD-10-CM | POA: Diagnosis present

## 2023-11-16 DIAGNOSIS — J440 Chronic obstructive pulmonary disease with acute lower respiratory infection: Secondary | ICD-10-CM | POA: Diagnosis present

## 2023-11-16 DIAGNOSIS — Z7969 Long term (current) use of other immunomodulators and immunosuppressants: Secondary | ICD-10-CM

## 2023-11-16 DIAGNOSIS — C7949 Secondary malignant neoplasm of other parts of nervous system: Secondary | ICD-10-CM | POA: Diagnosis present

## 2023-11-16 DIAGNOSIS — R443 Hallucinations, unspecified: Secondary | ICD-10-CM

## 2023-11-16 DIAGNOSIS — J432 Centrilobular emphysema: Secondary | ICD-10-CM | POA: Diagnosis present

## 2023-11-16 DIAGNOSIS — G90511 Complex regional pain syndrome I of right upper limb: Secondary | ICD-10-CM | POA: Diagnosis present

## 2023-11-16 DIAGNOSIS — Z79899 Other long term (current) drug therapy: Secondary | ICD-10-CM

## 2023-11-16 DIAGNOSIS — Z510 Encounter for antineoplastic radiation therapy: Secondary | ICD-10-CM | POA: Diagnosis not present

## 2023-11-16 DIAGNOSIS — T451X5A Adverse effect of antineoplastic and immunosuppressive drugs, initial encounter: Secondary | ICD-10-CM | POA: Diagnosis present

## 2023-11-16 DIAGNOSIS — D72829 Elevated white blood cell count, unspecified: Secondary | ICD-10-CM | POA: Diagnosis not present

## 2023-11-16 DIAGNOSIS — Z8679 Personal history of other diseases of the circulatory system: Secondary | ICD-10-CM

## 2023-11-16 DIAGNOSIS — Z86718 Personal history of other venous thrombosis and embolism: Secondary | ICD-10-CM

## 2023-11-16 DIAGNOSIS — J9811 Atelectasis: Secondary | ICD-10-CM | POA: Diagnosis present

## 2023-11-16 DIAGNOSIS — Z888 Allergy status to other drugs, medicaments and biological substances status: Secondary | ICD-10-CM

## 2023-11-16 DIAGNOSIS — G90522 Complex regional pain syndrome I of left lower limb: Secondary | ICD-10-CM | POA: Diagnosis present

## 2023-11-16 DIAGNOSIS — C770 Secondary and unspecified malignant neoplasm of lymph nodes of head, face and neck: Secondary | ICD-10-CM | POA: Diagnosis present

## 2023-11-16 DIAGNOSIS — R4182 Altered mental status, unspecified: Secondary | ICD-10-CM

## 2023-11-16 DIAGNOSIS — Z87891 Personal history of nicotine dependence: Secondary | ICD-10-CM

## 2023-11-16 DIAGNOSIS — Z79890 Hormone replacement therapy (postmenopausal): Secondary | ICD-10-CM

## 2023-11-16 DIAGNOSIS — G928 Other toxic encephalopathy: Secondary | ICD-10-CM | POA: Diagnosis present

## 2023-11-16 DIAGNOSIS — J910 Malignant pleural effusion: Secondary | ICD-10-CM | POA: Diagnosis not present

## 2023-11-16 DIAGNOSIS — G629 Polyneuropathy, unspecified: Secondary | ICD-10-CM | POA: Diagnosis not present

## 2023-11-16 DIAGNOSIS — Z7951 Long term (current) use of inhaled steroids: Secondary | ICD-10-CM

## 2023-11-16 LAB — HIV ANTIBODY (ROUTINE TESTING W REFLEX): HIV Screen 4th Generation wRfx: NONREACTIVE

## 2023-11-16 LAB — CBC WITH DIFFERENTIAL/PLATELET
Abs Immature Granulocytes: 0.18 10*3/uL — ABNORMAL HIGH (ref 0.00–0.07)
Basophils Absolute: 0.1 10*3/uL (ref 0.0–0.1)
Basophils Relative: 1 %
Eosinophils Absolute: 0.3 10*3/uL (ref 0.0–0.5)
Eosinophils Relative: 2 %
HCT: 28.3 % — ABNORMAL LOW (ref 39.0–52.0)
Hemoglobin: 9.2 g/dL — ABNORMAL LOW (ref 13.0–17.0)
Immature Granulocytes: 1 %
Lymphocytes Relative: 6 %
Lymphs Abs: 1.1 10*3/uL (ref 0.7–4.0)
MCH: 29.7 pg (ref 26.0–34.0)
MCHC: 32.5 g/dL (ref 30.0–36.0)
MCV: 91.3 fL (ref 80.0–100.0)
Monocytes Absolute: 2.9 10*3/uL — ABNORMAL HIGH (ref 0.1–1.0)
Monocytes Relative: 17 %
Neutro Abs: 12.3 10*3/uL — ABNORMAL HIGH (ref 1.7–7.7)
Neutrophils Relative %: 73 %
Platelets: 397 10*3/uL (ref 150–400)
RBC: 3.1 MIL/uL — ABNORMAL LOW (ref 4.22–5.81)
RDW: 19.3 % — ABNORMAL HIGH (ref 11.5–15.5)
WBC: 16.9 10*3/uL — ABNORMAL HIGH (ref 4.0–10.5)
nRBC: 0 % (ref 0.0–0.2)

## 2023-11-16 LAB — COMPREHENSIVE METABOLIC PANEL WITH GFR
ALT: 23 U/L (ref 0–44)
AST: 28 U/L (ref 15–41)
Albumin: 2.9 g/dL — ABNORMAL LOW (ref 3.5–5.0)
Alkaline Phosphatase: 107 U/L (ref 38–126)
Anion gap: 10 (ref 5–15)
BUN: 19 mg/dL (ref 6–20)
CO2: 26 mmol/L (ref 22–32)
Calcium: 8.8 mg/dL — ABNORMAL LOW (ref 8.9–10.3)
Chloride: 102 mmol/L (ref 98–111)
Creatinine, Ser: 0.73 mg/dL (ref 0.61–1.24)
GFR, Estimated: >60 mL/min (ref >60)
Glucose, Bld: 130 mg/dL — ABNORMAL HIGH (ref 70–99)
Potassium: 3.2 mmol/L — ABNORMAL LOW (ref 3.5–5.1)
Sodium: 138 mmol/L (ref 135–145)
Total Bilirubin: 0.4 mg/dL (ref 0.0–1.2)
Total Protein: 6.5 g/dL (ref 6.5–8.1)

## 2023-11-16 LAB — BLOOD GAS, VENOUS
Acid-Base Excess: 2.9 mmol/L — ABNORMAL HIGH (ref 0.0–2.0)
Bicarbonate: 27.9 mmol/L (ref 20.0–28.0)
O2 Saturation: 91.7 %
Patient temperature: 37
pCO2, Ven: 43 mmHg — ABNORMAL LOW (ref 44–60)
pH, Ven: 7.42 (ref 7.25–7.43)
pO2, Ven: 63 mmHg — ABNORMAL HIGH (ref 32–45)

## 2023-11-16 LAB — RAPID URINE DRUG SCREEN, HOSP PERFORMED
Amphetamines: NOT DETECTED
Barbiturates: NOT DETECTED
Benzodiazepines: POSITIVE — AB
Cocaine: NOT DETECTED
Opiates: NOT DETECTED
Tetrahydrocannabinol: NOT DETECTED

## 2023-11-16 LAB — RAD ONC ARIA SESSION SUMMARY
Course Elapsed Days: 12
Plan Fractions Treated to Date: 8
Plan Prescribed Dose Per Fraction: 3 Gy
Plan Total Fractions Prescribed: 10
Plan Total Prescribed Dose: 30 Gy
Reference Point Dosage Given to Date: 24 Gy
Reference Point Session Dosage Given: 3 Gy
Session Number: 8

## 2023-11-16 LAB — LACTIC ACID, PLASMA
Lactic Acid, Venous: 1 mmol/L (ref 0.5–1.9)
Lactic Acid, Venous: 1.1 mmol/L (ref 0.5–1.9)

## 2023-11-16 LAB — ETHANOL: Alcohol, Ethyl (B): <10 mg/dL (ref <10)

## 2023-11-16 LAB — AMMONIA: Ammonia: 23 umol/L (ref 9–35)

## 2023-11-16 MED ORDER — SODIUM CHLORIDE 0.9 % IV BOLUS
500.0000 mL | Freq: Once | INTRAVENOUS | Status: AC
  Administered 2023-11-16: 500 mL via INTRAVENOUS

## 2023-11-16 MED ORDER — SODIUM CHLORIDE 0.9 % IV SOLN
2.0000 g | INTRAVENOUS | Status: DC
  Administered 2023-11-17 – 2023-11-22 (×6): 2 g via INTRAVENOUS
  Filled 2023-11-16 (×6): qty 20

## 2023-11-16 MED ORDER — SODIUM CHLORIDE 0.9 % IV SOLN
1.0000 g | Freq: Once | INTRAVENOUS | Status: AC
  Administered 2023-11-16: 1 g via INTRAVENOUS
  Filled 2023-11-16: qty 10

## 2023-11-16 MED ORDER — ACETAMINOPHEN 650 MG RE SUPP: 650.0000 mg | Freq: Four times a day (QID) | RECTAL | Status: DC | PRN

## 2023-11-16 MED ORDER — SODIUM CHLORIDE 0.9 % IV SOLN
500.0000 mg | Freq: Once | INTRAVENOUS | Status: AC
  Administered 2023-11-16: 500 mg via INTRAVENOUS
  Filled 2023-11-16: qty 5

## 2023-11-16 MED ORDER — POTASSIUM CHLORIDE 10 MEQ/100ML IV SOLN
10.0000 meq | INTRAVENOUS | Status: AC
  Administered 2023-11-17 (×6): 10 meq via INTRAVENOUS
  Filled 2023-11-16 (×6): qty 100

## 2023-11-16 MED ORDER — HALOPERIDOL LACTATE 5 MG/ML IJ SOLN
2.0000 mg | Freq: Once | INTRAMUSCULAR | Status: AC
  Administered 2023-11-16: 2 mg via INTRAVENOUS
  Filled 2023-11-16: qty 1

## 2023-11-16 MED ORDER — FUROSEMIDE 10 MG/ML IJ SOLN
40.0000 mg | Freq: Once | INTRAMUSCULAR | Status: AC
  Administered 2023-11-16: 40 mg via INTRAVENOUS

## 2023-11-16 MED ORDER — SODIUM CHLORIDE 0.9 % IV SOLN
500.0000 mg | INTRAVENOUS | Status: AC
  Administered 2023-11-17 – 2023-11-19 (×3): 500 mg via INTRAVENOUS
  Filled 2023-11-16 (×3): qty 5

## 2023-11-16 MED ORDER — POLYETHYLENE GLYCOL 3350 17 G PO PACK: 17.0000 g | PACK | Freq: Every day | ORAL | Status: DC | PRN

## 2023-11-16 MED ORDER — FUROSEMIDE 10 MG/ML IJ SOLN: 20.0000 mg | Freq: Once | INTRAMUSCULAR | Status: DC

## 2023-11-16 MED ORDER — FUROSEMIDE 10 MG/ML IJ SOLN
INTRAMUSCULAR | Status: AC
  Filled 2023-11-16: qty 2

## 2023-11-16 MED ORDER — BUDESON-GLYCOPYRROL-FORMOTEROL 160-9-4.8 MCG/ACT IN AERO
2.0000 | INHALATION_SPRAY | Freq: Two times a day (BID) | RESPIRATORY_TRACT | Status: DC
  Administered 2023-11-16 – 2023-11-22 (×11): 2 via RESPIRATORY_TRACT
  Filled 2023-11-16: qty 5.9

## 2023-11-16 MED ORDER — SODIUM CHLORIDE 0.9% FLUSH
3.0000 mL | Freq: Two times a day (BID) | INTRAVENOUS | Status: DC
  Administered 2023-11-16 – 2023-11-22 (×12): 3 mL via INTRAVENOUS

## 2023-11-16 MED ORDER — ACETAMINOPHEN 325 MG PO TABS
650.0000 mg | ORAL_TABLET | Freq: Four times a day (QID) | ORAL | Status: DC | PRN
  Administered 2023-11-17: 650 mg via ORAL
  Filled 2023-11-16: qty 2

## 2023-11-16 MED ORDER — LEVOTHYROXINE SODIUM 100 MCG PO TABS
100.0000 ug | ORAL_TABLET | Freq: Every day | ORAL | Status: DC
  Administered 2023-11-18 – 2023-11-22 (×5): 100 ug via ORAL
  Filled 2023-11-16 (×6): qty 1

## 2023-11-16 MED ORDER — ENOXAPARIN SODIUM 40 MG/0.4ML IJ SOSY
40.0000 mg | PREFILLED_SYRINGE | INTRAMUSCULAR | Status: DC
  Administered 2023-11-17 – 2023-11-22 (×6): 40 mg via SUBCUTANEOUS
  Filled 2023-11-16 (×6): qty 0.4

## 2023-11-16 MED ORDER — POTASSIUM CHLORIDE 20 MEQ PO PACK
60.0000 meq | PACK | Freq: Once | ORAL | Status: AC
  Administered 2023-11-16: 60 meq via ORAL
  Filled 2023-11-16: qty 3

## 2023-11-16 MED ORDER — LORAZEPAM 2 MG/ML IJ SOLN
0.5000 mg | Freq: Once | INTRAMUSCULAR | Status: AC
  Administered 2023-11-16: 0.5 mg via INTRAVENOUS
  Filled 2023-11-16: qty 1

## 2023-11-16 MED ORDER — ALBUTEROL SULFATE (2.5 MG/3ML) 0.083% IN NEBU
2.5000 mg | INHALATION_SOLUTION | RESPIRATORY_TRACT | Status: DC | PRN
  Administered 2023-11-16: 2.5 mg via RESPIRATORY_TRACT
  Filled 2023-11-16: qty 3

## 2023-11-16 NOTE — H&P (Addendum)
 History and Physical    Patient: Brian [name] ZOX:[id number] DOB: 12-03-1960 DOA: 11/16/2023 DOS: the patient was seen and examined on 11/16/2023 PCP: [name], MD  Patient coming from: Home  Chief Complaint:  Chief Complaint  Patient presents with   Hallucinations   HPI: Brian [name] is a 63 y.o. male with medical history significant of medical issues as listed below. Patient has chronic right upper extremity palsy.  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 See onc history below. Furhter patient has had frequent right chest thoracentesis to drain fluid of the chest in the past.  [name] seems to have been in usual state of health till approximately 3 or 4 days ago when the patient started having increasing shortness of breath which is principally during periods of exertion such as walking.  It was associated with some cough and congestion/expectoration.  Patient also had increased shortness of breath sensation at night and would occasionally wake up with same.  Patient noticed his SpO2 at home was 87% on room air.  Prompting him to come to the ER today.  Patient reports some lower extremity swelling bilaterally.  However no chest pain no fever.   Another concern for the patient has been increased confusion at home.  For example wife reports patient rush patient rushing downstairs today and saying that his forearm had people inside that needed to be taken out.  There has been similar confusion noted here in the ER tonight.  Patient has gotten out of his stretcher and check the back of it to see if the people there are gone.  Otherwise people patient has been noted to be hypoxic slightly in the ER stabilized on 2 L/min of supplementary oxygen.  Rest of the ER course has been unremarkable.    Review of Systems: As mentioned in the history of present illness. All other systems reviewed and are negative. 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    Non-small cell cancer of right lung (HCC) 08/23/2018   Pneumonia    Pre-diabetes    Radiation-induced brachial plexopathy    Recurrent right pleural effusion    Secondary malignant neoplasm of brain and spinal cord (HCC)    Subarachnoid hemorrhage (HCC) 2016   Voice hoarseness    Past Surgical History:  Procedure Laterality Date   COLONOSCOPY     FEMUR FRACTURE SURGERY Left 1969   INGUINAL HERNIA REPAIR Right 06/24/2020   Procedure: RIGHT OPEN INGUINAL HERNIA REPAIR WITH MESH;  Surgeon: Kinsinger, De Blanch, MD;  Location: [facility] ORS;  Service: General;  Laterality: Right;   IR IVC FILTER PLMT / S&I Lenise Arena GUID/MOD SED  03/04/2020   IR IVC FILTER RETRIEVAL / S&I Lenise Arena GUID/MOD SED  08/15/2020   IR RADIOLOGIST EVAL & MGMT  08/06/2020   SPINAL CORD STIMULATOR INSERTION N/A 10/05/2023   Procedure: CERVICAL SPINAL CORD STIMULATOR PLACEMENT;  Surgeon: Lovenia Kim, MD;  Location: [facility] ORS;  Service: Neurosurgery;  Laterality: N/A;   Social History:  reports that he has quit smoking. His smoking use included cigarettes. He has never used smokeless tobacco. He reports current alcohol  use of about 5.0 standard drinks of alcohol per week. He reports that he does not use drugs.  No Known Allergies  Family History  Problem Relation Age of Onset   Diabetes Mother    Diabetes Brother    Colon cancer Neg Hx    Stomach cancer Neg Hx    Thyroid cancer Neg Hx     Prior to Admission medications   Medication Sig Start Date End Date Taking? Authorizing Provider  acetaminophen (TYLENOL) 500 MG tablet Take 1,000 mg by mouth every 6 (six) hours as needed (pain.).    [provider]  albuterol  (VENTOLIN HFA) 108 (90 Base) MCG/ACT inhaler INHALE 2 PUFFS INTO THE LUNGS EVERY 4 (FOUR) HOURS AS NEEDED FOR WHEEZING OR SHORTNESS OF BREATH. 11/07/20   Coral Ceo, NP  amitriptyline (ELAVIL) 25 MG tablet Take 3 tablets (75 mg total) by mouth at bedtime. 08/30/23 10/21/23  Edward Jolly, MD  amLODipine (NORVASC) 5 MG tablet Take 5 mg by mouth in the morning. 06/10/21   [provider]  atorvastatin (LIPITOR) 20 MG tablet Take 20 mg by mouth in the morning. Patient not taking: Reported on 10/21/2023    [provider]  baclofen (LIORESAL) 10 MG tablet Take 10 mg by mouth daily. 09/26/23   [provider]  cyclobenzaprine (FLEXERIL) 5 MG tablet Take 1 tablet (5 mg total) by mouth 3 (three) times daily as needed for muscle spasms. Patient not taking: Reported on 10/21/2023 10/18/23   Joan Flores, PA-C  DULoxetine (CYMBALTA) 60 MG capsule Take 1 capsule (60 mg total) by mouth daily. 03/17/23   Antony Madura, MD  levothyroxine (SYNTHROID) 100 MCG tablet Take 100 mcg by mouth daily before breakfast.    [provider]  LORazepam (ATIVAN) 0.5 MG tablet 1 tab po 30 minutes prior to radiation appointments 10/26/23   Ronny Bacon, PA-C  lorlatinib [facility]) 100 MG tablet Take 1 tablet (100 mg total) by mouth daily. Swallow tablets whole. Do not chew, crush or split tablets. 10/14/23   Si Gaul, MD  mirtazapine (REMERON) 15 MG tablet Take 1 tablet (15 mg total) by mouth at bedtime. Patient not taking: Reported on 10/21/2023 10/13/23   Si Gaul, MD  ondansetron (ZOFRAN) 8 MG tablet Take 1 tablet (8 mg total) by mouth every 8 (eight) hours as needed for nausea or vomiting. 11/03/23   Heilingoetter, Cassandra L, PA-C  oxyCODONE (ROXICODONE) 5 MG immediate release tablet Take 1 tablet (5 mg total) by mouth every 4 (four) hours as needed for severe pain (pain score 7-10). Patient not taking: Reported on 10/21/2023 10/05/23   Joan Flores, PA-C  PREDNISONE PO  Take by mouth. Unsure of dosage; ordered by Dr. Andee Poles for hoarseness for 6 days Patient not taking: Reported on 10/21/2023    [provider]  prochlorperazine (COMPAZINE) 10 MG tablet Take 1 tablet (10 mg total) by mouth every 6 (six) hours as needed. 11/03/23   Heilingoetter, Cassandra L, PA-C  TRELEGY ELLIPTA 100-62.5-25 MCG/INH AEPB Inhale 1 puff into the lungs in the morning. 01/09/20   [provider]  venlafaxine (EFFEXOR) 37.5 MG tablet Take 37.5 mg by mouth in the morning. 05/25/23   [provider]    Physical Exam: Vitals:   11/16/23 1337 11/16/23 1339 11/16/23 1500 11/16/23 1630  BP:   (!) 140/94 (!) 169/94  Pulse:   (!) 103 (!) 118  Resp:   18 (!) 21  Temp: 98.5 F (36.9 C)  TempSrc: Oral     SpO2:  92% (!) 87% 92%   General: Patient is alert and awake and gives a reasonably coherent account of his symptoms.  Unfortunately patient takes 2 tends to take his oxygen off and then get somnolent. Respiratory exam: Reduced breath sounds right lung base, otherwise air entry is vesicular without any adventitious sounds Cardiovascular exam S1-S2 normal Abdomen all quadrants soft nontender Extremities warm trace left lower extremity edema. Data Reviewed:  Labs on Admission:  Results for orders placed or performed during the hospital encounter of 11/16/23 (from the past 24 hours)  Comprehensive metabolic panel     Status: Abnormal   Collection Time: 11/16/23  3:47 PM  Result Value Ref Range   Sodium 138 135 - 145 mmol/L   Potassium 3.2 (L) 3.5 - 5.1 mmol/L   Chloride 102 98 - 111 mmol/L   CO2 26 22 - 32 mmol/L   Glucose, Bld 130 (H) 70 - 99 mg/dL   BUN 19 6 - 20 mg/dL   Creatinine, Ser 1.61 0.61 - 1.24 mg/dL   Calcium 8.8 (L) 8.9 - 10.3 mg/dL   Total Protein 6.5 6.5 - 8.1 g/dL   Albumin 2.9 (L) 3.5 - 5.0 g/dL   AST 28 15 - 41 U/L   ALT 23 0 - 44 U/L   Alkaline Phosphatase 107 38 - 126 U/L   Total Bilirubin 0.4 0.0 - 1.2 mg/dL   GFR, Estimated >09  >60 mL/min   Anion gap 10 5 - 15  CBC WITH DIFFERENTIAL     Status: Abnormal   Collection Time: 11/16/23  3:47 PM  Result Value Ref Range   WBC 16.9 (H) 4.0 - 10.5 K/uL   RBC 3.10 (L) 4.22 - 5.81 MIL/uL   Hemoglobin 9.2 (L) 13.0 - 17.0 g/dL   HCT 45.4 (L) 09.8 - 11.9 %   MCV 91.3 80.0 - 100.0 fL   MCH 29.7 26.0 - 34.0 pg   MCHC 32.5 30.0 - 36.0 g/dL   RDW 14.7 (H) 82.9 - 56.2 %   Platelets 397 150 - 400 K/uL   nRBC 0.0 0.0 - 0.2 %   Neutrophils Relative % 73 %   Neutro Abs 12.3 (H) 1.7 - 7.7 K/uL   Lymphocytes Relative 6 %   Lymphs Abs 1.1 0.7 - 4.0 K/uL   Monocytes Relative 17 %   Monocytes Absolute 2.9 (H) 0.1 - 1.0 K/uL   Eosinophils Relative 2 %   Eosinophils Absolute 0.3 0.0 - 0.5 K/uL   Basophils Relative 1 %   Basophils Absolute 0.1 0.0 - 0.1 K/uL   Immature Granulocytes 1 %   Abs Immature Granulocytes 0.18 (H) 0.00 - 0.07 K/uL  Ethanol     Status: None   Collection Time: 11/16/23  3:47 PM  Result Value Ref Range   Alcohol, Ethyl (B) <10 <10 mg/dL  Ammonia     Status: None   Collection Time: 11/16/23  3:47 PM  Result Value Ref Range   Ammonia 23 9 - 35 umol/L  Rapid urine drug screen (hospital performed)     Status: Abnormal   Collection Time: 11/16/23  4:37 PM  Result Value Ref Range   Opiates NONE DETECTED NONE DETECTED   Cocaine NONE DETECTED NONE DETECTED   Benzodiazepines POSITIVE (A) NONE DETECTED   Amphetamines NONE DETECTED NONE DETECTED   Tetrahydrocannabinol NONE DETECTED NONE DETECTED   Barbiturates NONE DETECTED NONE DETECTED  Lactic acid, plasma     Status: None  Collection Time: 11/16/23  5:45 PM  Result Value Ref Range   Lactic Acid, Venous 1.0 0.5 - 1.9 mmol/L   Basic Metabolic Panel: Recent Labs  Lab 11/16/23 1547  NA 138  K 3.2*  CL 102  CO2 26  GLUCOSE 130*  BUN 19  CREATININE 0.73  CALCIUM 8.8*   Liver Function Tests: Recent Labs  Lab 11/16/23 1547  AST 28  ALT 23  ALKPHOS 107  BILITOT 0.4  PROT 6.5  ALBUMIN 2.9*    No results for input(s): "LIPASE", "AMYLASE" in the last 168 hours. Recent Labs  Lab 11/16/23 1547  AMMONIA 23   CBC: Recent Labs  Lab 11/16/23 1547  WBC 16.9*  NEUTROABS 12.3*  HGB 9.2*  HCT 28.3*  MCV 91.3  PLT 397   Cardiac Enzymes: No results for input(s): "CKTOTAL", "CKMB", "CKMBINDEX", "TROPONINIHS" in the last 168 hours.  BNP (last 3 results) No results for input(s): "PROBNP" in the last 8760 hours. CBG: No results for input(s): "GLUCAP" in the last 168 hours.  Radiological Exams on Admission:  DG Chest 1 View Result Date: 11/16/2023 CLINICAL DATA:  confusion, cancer history EXAM: CHEST  1 VIEW COMPARISON:  08/27/2023 FINDINGS: Interval development of moderate right pleural effusion, with atelectasis/consolidation the right lung base. There is some linear right suprahilar scarring or atelectasis. Left lung clear. Heart size and mediastinal contours are within normal limits. Small caliber catheter placement through the left neck, chest, and upper abdomen. Visualized bones unremarkable. IMPRESSION: Moderate right pleural effusion with right basilar atelectasis/consolidation. Electronically Signed   By: Corlis Leak M.D.   On: 11/16/2023 17:56   CT HEAD WO CONTRAST Result Date: 11/16/2023 CLINICAL DATA:  Mental status change of unknown cause. Head and neck cancer being treated with radiation. EXAM: CT HEAD WITHOUT CONTRAST TECHNIQUE: Contiguous axial images were obtained from the base of the skull through the vertex without intravenous contrast. RADIATION DOSE REDUCTION: This exam was performed according to the departmental dose-optimization program which includes automated exposure control, adjustment of the mA and/or kV according to patient size and/or use of iterative reconstruction technique. COMPARISON:  10/15/2022 FINDINGS: Brain: The brain shows a normal appearance without evidence of malformation, atrophy, old or acute small or large vessel infarction, mass lesion,  hemorrhage, hydrocephalus or extra-axial collection. Vascular: No hyperdense vessel. No evidence of atherosclerotic calcification. Skull: Normal.  No traumatic finding.  No focal bone lesion. Sinuses/Orbits: Sinuses are clear. Orbits appear normal. Mastoids are clear. Other: Upper cervical neurostimulator in place. IMPRESSION: Normal head CT. Upper cervical neurostimulator in place. Electronically Signed   By: Paulina Fusi M.D.   On: 11/16/2023 16:21     No intake/output data recorded. No intake/output data recorded.      Assessment and Plan: * Pneumonia Associated with acute hypoxemic respiratory failure cough and congestion reported by patient as well as leukocytosis worse than before neutrophil predominant.  Felt to be in the right lower lung field with concern for associated reaccumulation of prior known pleural effusion.  Will request IR help in draining this effusion.  Continue treatment with ceftriaxone and azithromycin.  AMS (altered mental status) Patient with prior known brain metastases with acute metabolic encephalopathy due to hypoxemia as well as pneumonia.  Patient is demonstrating slightly fluctuating mentation in the ER which closely correlates with him removing oxygen and getting hypoxic.  Head CT as above, nonactionable.  I think it would not help to do an MRI brain in this patient with prior known metastatic disease.  As the likelihood of management changing finding is pretty remote.  We will control the patient's metabolic stressors as above.  And monitor clinically.  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 Per neuro note from 10/21/2023:   a 63 y.o. male with bilateral arm pain, weakness, and atrophy, right >>> left - EMG was consistent with a diffuse right brachial plexopathy with myokymic discharges as can be seen in radiation plexopathy (seen in right FPL, biceps, and infraspinatus). His LUE showed abnormal median, ulnar, and radial sensory responses with chronic  neurogenic changes seen in the left FDI, FPL (with active changes), and APB, suggesting a possible lower trunk predominant process vs C8 and T1 radiculopathy. Patient continued to have progressive weakness of the RUE which now has no movement proximally or distally. LUE symptoms are mostly limited to hand, but profound. His pain is much improved with spinal cord stimulator.    Unfortunately, patient's weakness not only progressed, but he also developed hoarseness of his voice. 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 His has an up coming appointment with oncology to discuss his options.  Adenocarcinoma of right lung, stage 4 (HCC) Per onc note 10/13/2023:   This is a very pleasant 63 years old white male with metastatic non-small cell lung cancer, adenocarcinoma now with ALK gene translocation.  The patient was initially diagnosed as a stage IIIa non-small cell lung cancer status post a course of concurrent chemoradiation with weekly carboplatin and paclitaxel followed by 1 year of consolidation treatment with immunotherapy with Imfinzi. He had evidence for disease progression recently. Repeat tissue biopsy from the left supraclavicular lymph nodes and molecular studies showed that the patient has positive ALK gene translocation.  His previous molecular studies by [employer] was negative. He received 1 cycle of systemic chemotherapy with carboplatin, Alimta and Keytruda.  We will discontinue his systemic chemotherapy for now because of the new findings on the molecular studies. The patient has been on treatment with [name] 600 mg p.o. twice daily.  He is status post 43 months of treatment.   The patient had CT scan of the neck performed recently that showed evidence of metastatic disease in the neck including multiple pathologic left-sided cervical lymph nodes as well as a 4 cm region of ill-defined masslike soft tissue extending from right  level 4 to the upper chest with encasement of the right subclavian artery and likely brachial plexus involvement.  The latter is likely the etiology of the right focal cord paresis.  I personally independently reviewed the scan images and discussed the result and showed the images to the patient and his wife.      Metastatic non-small cell lung cancer (NSCLC) adenocarcinoma Initially diagnosed as stage IIIB in January 2020, treated with concurrent chemoradiation. Recurrence in cervical lymph node in June 2021. On alectinib 600 mg twice daily for 43 months. Recent CT shows lymph node enlargement in previously treated area. Symptoms include hoarseness and right arm weakness, likely due to lymph node involvement. PET scan required to assess further metastasis and guide treatment. Considering switch to lorlatinib due to its efficacy in ALK gene translocation and potential to overcome alectinib resistance. Discussed lorlatinib side effects: weight gain, mood swings, increased cholesterol. Radiation therapy considered post-PET scan to target specific areas if indicated, acknowledging previous radiation may have contributed to current issues. - Order PET scan to assess for metastasis and guide treatment. - Switch to lorlatinib 100 mg once daily. - Monitor  for lorlatinib side effects: weight gain, mood swings, increased cholesterol. - Check baseline cholesterol and monitor regularly. - Refer to Dr. Eloise Hake for radiation therapy consultation post-PET scan.   Medication reconciliation is pending pharmacy input.  I discussed meds with the wife and ordered as much as I could.   Advance Care Planning:   Code Status: Full Code as per wife at the bedside. I think palliative care evaluation would be appropriate In this situation due to advanced medical illness - I do not see one in chart at this time.  Consults: I have requested IR thoracentesis. Call onc in AM if needed.  Family Communication: wife at bedside all  questions answred.  Severity of Illness: The appropriate patient status for this patient is INPATIENT. Inpatient status is judged to be reasonable and necessary in order to provide the required intensity of service to ensure the patient's safety. The patient's presenting symptoms, physical exam findings, and initial radiographic and laboratory data in the context of their chronic comorbidities is felt to place them at high risk for further clinical deterioration. Furthermore, it is not anticipated that the patient will be medically stable for discharge from the hospital within 2 midnights of admission.   * I certify that at the point of admission it is my clinical judgment that the patient will require inpatient hospital care spanning beyond 2 midnights from the point of admission due to high intensity of service, high risk for further deterioration and high frequency of surveillance required.*  Author: Bennie Brave, MD 11/16/2023 6:32 PM  For on call review [email or web address].

## 2023-11-16 NOTE — Assessment & Plan Note (Signed)
 Patient with prior known brain metastases with acute metabolic encephalopathy due to hypoxemia as well as pneumonia.  Patient is demonstrating slightly fluctuating mentation in the ER which closely correlates with him removing oxygen and getting hypoxic.  Head CT as above, nonactionable.  I think it would not help to do an MRI brain in this patient with prior known metastatic disease.  As the likelihood of management changing finding is pretty remote.  We will control the patient's metabolic stressors as above.  And monitor clinically.

## 2023-11-16 NOTE — ED Triage Notes (Signed)
 BIB EMS stage 4 throat cancer, started radiation  second round 4 days ago, Hallucinations started yesterday, feels like ants are crawling out from skin. Left side weakness from radiation. 117-120/86-94% RA CBG 117 #20 L AC.

## 2023-11-16 NOTE — Assessment & Plan Note (Signed)
 Chronic. Per neuro note from 10/21/2023:   a 63 y.o. male with bilateral arm pain, weakness, and atrophy, right >>> left - EMG was consistent with a diffuse right brachial plexopathy with myokymic discharges as can be seen in radiation plexopathy (seen in right FPL, biceps, and infraspinatus). His LUE showed abnormal median, ulnar, and radial sensory responses with chronic neurogenic changes seen in the left FDI, FPL (with active changes), and APB, suggesting a possible lower trunk predominant process vs C8 and T1 radiculopathy. Patient continued to have progressive weakness of the RUE which now has no movement proximally or distally. LUE symptoms are mostly limited to hand, but profound. His pain is much improved with spinal cord stimulator.    Unfortunately, patient's weakness not only progressed, but he also developed hoarseness of his voice. 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 His has an up coming appointment with oncology to discuss his options.

## 2023-11-16 NOTE — ED Provider Notes (Signed)
 [place] EMERGENCY DEPARTMENT AT [facility] Provider Note   CSN: [id number] Arrival date & time: 11/16/23  1320     History {Add pertinent medical, surgical, social history, OB history to HPI:1} Chief Complaint  Patient presents with   Hallucinations    Brian [name] is a 63 y.o. male.  HPI   Pt states he was diagnosed with recurrent of cancer recently.  It was in his lung and in his neck.  Pt started radiation treatment.  Pt states he has been having trouble with difficulty breathing at times, he is also having episodes of panic attacks.  Pt states he will see things that are not there.  At night he has noticed when trying to sleep he will see things that look like it is moving, like a mouse or sometimes a person.  Pt woke up at night 4 times last night and woke his wife.  Home Medications Prior to Admission medications   Medication Sig Start Date End Date Taking? Authorizing Provider  acetaminophen (TYLENOL) 500 MG tablet Take 1,000 mg by mouth every 6 (six) hours as needed (pain.).    [provider]  albuterol (VENTOLIN HFA) 108 (90 Base) MCG/ACT inhaler INHALE 2 PUFFS INTO THE LUNGS EVERY 4 (FOUR) HOURS AS NEEDED FOR WHEEZING OR SHORTNESS OF BREATH. 11/07/20   Jenni Mody, NP  amitriptyline (ELAVIL) 25 MG tablet Take 3 tablets (75 mg total) by mouth at bedtime. 08/30/23 10/21/23  Lateef, Bilal, MD  amLODipine (NORVASC) 5 MG tablet Take 5 mg by mouth in the morning. 06/10/21   [provider]  atorvastatin (LIPITOR) 20 MG tablet Take 20 mg by mouth in the morning. Patient not taking: Reported on 10/21/2023    [provider]  baclofen (LIORESAL) 10 MG tablet Take 10 mg by mouth daily. 09/26/23   [provider]  cyclobenzaprine (FLEXERIL) 5 MG tablet Take 1 tablet (5 mg total) by mouth 3 (three) times daily as needed for muscle spasms. Patient not taking: Reported on 10/21/2023 10/18/23   Ludwig Safer, PA-C  DULoxetine (CYMBALTA)  60 MG capsule Take 1 capsule (60 mg total) by mouth daily. 03/17/23   Ellene Gustin, MD  levothyroxine (SYNTHROID) 100 MCG tablet Take 100 mcg by mouth daily before breakfast.    [provider]  LORazepam (ATIVAN) 0.5 MG tablet 1 tab po 30 minutes prior to radiation appointments 10/26/23   Bettejane Brownie, PA-C  lorlatinib ([name]) 100 MG tablet Take 1 tablet (100 mg total) by mouth daily. Swallow tablets whole. Do not chew, crush or split tablets. 10/14/23   Marlene Simas, MD  mirtazapine (REMERON) 15 MG tablet Take 1 tablet (15 mg total) by mouth at bedtime. Patient not taking: Reported on 10/21/2023 10/13/23   Marlene Simas, MD  ondansetron (ZOFRAN) 8 MG tablet Take 1 tablet (8 mg total) by mouth every 8 (eight) hours as needed for nausea or vomiting. 11/03/23   Heilingoetter, Cassandra L, PA-C  oxyCODONE (ROXICODONE) 5 MG immediate release tablet Take 1 tablet (5 mg total) by mouth every 4 (four) hours as needed for severe pain (pain score 7-10). Patient not taking: Reported on 10/21/2023 10/05/23   Ludwig Safer, PA-C  PREDNISONE PO Take by mouth. Unsure of dosage; ordered by Dr. Donnie Galea for hoarseness for 6 days Patient not taking: Reported on 10/21/2023    [provider]  prochlorperazine (COMPAZINE) 10 MG tablet Take 1 tablet (10 mg total) by mouth every 6 (six) hours as needed.  11/03/23   Heilingoetter, Cassandra L, PA-C  TRELEGY ELLIPTA 100-62.5-25 MCG/INH AEPB Inhale 1 puff into the lungs in the morning. 01/09/20   [provider]  venlafaxine (EFFEXOR) 37.5 MG tablet Take 37.5 mg by mouth in the morning. 05/25/23   [provider]      Allergies    Patient has no known allergies.    Review of Systems   Review of Systems  Physical Exam Updated Vital Signs BP (!) 145/131   Pulse (!) 119   Temp 98.5 F (36.9 C) (Oral)   Resp (!) 23   SpO2 92%  Physical Exam Vitals and nursing note reviewed.  Constitutional:      Appearance: He is  well-developed. He is ill-appearing.  HENT:     Head: Normocephalic and atraumatic.     Right Ear: External ear normal.     Left Ear: External ear normal.  Eyes:     General: No scleral icterus.       Right eye: No discharge.        Left eye: No discharge.     Conjunctiva/sclera: Conjunctivae normal.  Neck:     Trachea: No tracheal deviation.  Cardiovascular:     Rate and Rhythm: Normal rate and regular rhythm.  Pulmonary:     Effort: Pulmonary effort is normal. No respiratory distress.     Breath sounds: Normal breath sounds. No stridor. No wheezing or rales.  Abdominal:     General: Bowel sounds are normal. There is no distension.     Palpations: Abdomen is soft.     Tenderness: There is no abdominal tenderness. There is no guarding or rebound.  Musculoskeletal:        General: No tenderness or deformity.     Cervical back: Neck supple.  Skin:    General: Skin is warm and dry.     Findings: No rash.  Neurological:     Mental Status: He is alert.     Cranial Nerves: No cranial nerve deficit, dysarthria or facial asymmetry.     Sensory: No sensory deficit.     Motor: Weakness present. No abnormal muscle tone or seizure activity.     Coordination: Coordination normal.     Comments: Weakness right upper extremity, patient states this has been chronic since his prior cancer treatment that caused injury to the brachial plexus, no facial droop, equal strength in his left upper extremity right and left lower extremities  Psychiatric:        Mood and Affect: Mood normal.     ED Results / Procedures / Treatments   Labs (all labs ordered are listed, but only abnormal results are displayed) Labs Reviewed - No data to display  EKG EKG Interpretation Date/Time:  Tuesday November 16 2023 13:29:49 EDT Ventricular Rate:  119 PR Interval:  143 QRS Duration:  93 QT Interval:  276 QTC Calculation: 389 R Axis:   52  Text Interpretation: Sinus tachycardia Borderline T abnormalities,  inferior leads Since last tracing rate faster Confirmed by Linwood Dibbles [phone]) on 11/16/2023 1:35:28 PM  Radiology No results found.  Procedures Procedures  {Document cardiac monitor, telemetry assessment procedure when appropriate:1}  Medications Ordered in ED Medications - No data to display  ED Course/ Medical Decision Making/ A&P   {   Click here for ABCD2, HEART and other calculatorsREFRESH Note before signing :1}  Medical Decision Making Amount and/or Complexity of Data Reviewed Labs: ordered. Radiology: ordered.   ***  {Document critical care time when appropriate:1} {Document review of labs and clinical decision tools ie heart score, Chads2Vasc2 etc:1}  {Document your independent review of radiology images, and any outside records:1} {Document your discussion with family members, caretakers, and with consultants:1} {Document social determinants of health affecting pt's care:1} {Document your decision making why or why not admission, treatments were needed:1} Final Clinical Impression(s) / ED Diagnoses Final diagnoses:  None    Rx / DC Orders ED Discharge Orders     None

## 2023-11-16 NOTE — Assessment & Plan Note (Signed)
 Per onc note 10/13/2023:   This is a very pleasant 63 years old white male with metastatic non-small cell lung cancer, adenocarcinoma now with ALK gene translocation.  The patient was initially diagnosed as a stage IIIa non-small cell lung cancer status post a course of concurrent chemoradiation with weekly carboplatin and paclitaxel followed by 1 year of consolidation treatment with immunotherapy with Imfinzi. He had evidence for disease progression recently. Repeat tissue biopsy from the left supraclavicular lymph nodes and molecular studies showed that the patient has positive ALK gene translocation.  His previous molecular studies by [employer] was negative. He received 1 cycle of systemic chemotherapy with carboplatin, Alimta and Keytruda.  We will discontinue his systemic chemotherapy for now because of the new findings on the molecular studies. The patient has been on treatment with [name] 600 mg p.o. twice daily.  He is status post 43 months of treatment.   The patient had CT scan of the neck performed recently that showed evidence of metastatic disease in the neck including multiple pathologic left-sided cervical lymph nodes as well as a 4 cm region of ill-defined masslike soft tissue extending from right level 4 to the upper chest with encasement of the right subclavian artery and likely brachial plexus involvement.  The latter is likely the etiology of the right focal cord paresis.  I personally independently reviewed the scan images and discussed the result and showed the images to the patient and his wife.      Metastatic non-small cell lung cancer (NSCLC) adenocarcinoma Initially diagnosed as stage IIIB in January 2020, treated with concurrent chemoradiation. Recurrence in cervical lymph node in June 2021. On alectinib 600 mg twice daily for 43 months. Recent CT shows lymph node enlargement in previously treated area. Symptoms include hoarseness and right arm weakness, likely due to lymph  node involvement. PET scan required to assess further metastasis and guide treatment. Considering switch to lorlatinib due to its efficacy in ALK gene translocation and potential to overcome alectinib resistance. Discussed lorlatinib side effects: weight gain, mood swings, increased cholesterol. Radiation therapy considered post-PET scan to target specific areas if indicated, acknowledging previous radiation may have contributed to current issues. - Order PET scan to assess for metastasis and guide treatment. - Switch to lorlatinib 100 mg once daily. - Monitor for lorlatinib side effects: weight gain, mood swings, increased cholesterol. - Check baseline cholesterol and monitor regularly. - Refer to Dr. Eloise Hake for radiation therapy consultation post-PET scan.

## 2023-11-16 NOTE — Assessment & Plan Note (Addendum)
 Associated with acute hypoxemic respiratory failure cough and congestion reported by patient as well as leukocytosis worse than before neutrophil predominant.  Felt to be in the right lower lung field with concern for associated reaccumulation of prior known pleural effusion.  Will request IR help in draining this effusion.  Continue treatment with ceftriaxone and azithromycin.

## 2023-11-17 ENCOUNTER — Inpatient Hospital Stay (HOSPITAL_COMMUNITY)

## 2023-11-17 ENCOUNTER — Ambulatory Visit

## 2023-11-17 ENCOUNTER — Encounter: Admitting: Neurosurgery

## 2023-11-17 ENCOUNTER — Other Ambulatory Visit: Payer: Self-pay | Admitting: Internal Medicine

## 2023-11-17 DIAGNOSIS — M7989 Other specified soft tissue disorders: Secondary | ICD-10-CM | POA: Diagnosis not present

## 2023-11-17 DIAGNOSIS — C3491 Malignant neoplasm of unspecified part of right bronchus or lung: Secondary | ICD-10-CM | POA: Diagnosis not present

## 2023-11-17 DIAGNOSIS — J189 Pneumonia, unspecified organism: Secondary | ICD-10-CM | POA: Diagnosis not present

## 2023-11-17 LAB — CBC
HCT: 30.6 % — ABNORMAL LOW (ref 39.0–52.0)
Hemoglobin: 9.7 g/dL — ABNORMAL LOW (ref 13.0–17.0)
MCH: 29.5 pg (ref 26.0–34.0)
MCHC: 31.7 g/dL (ref 30.0–36.0)
MCV: 93 fL (ref 80.0–100.0)
Platelets: 376 10*3/uL (ref 150–400)
RBC: 3.29 MIL/uL — ABNORMAL LOW (ref 4.22–5.81)
RDW: 19.3 % — ABNORMAL HIGH (ref 11.5–15.5)
WBC: 19.5 10*3/uL — ABNORMAL HIGH (ref 4.0–10.5)
nRBC: 0 % (ref 0.0–0.2)

## 2023-11-17 LAB — BODY FLUID CELL COUNT WITH DIFFERENTIAL
Eos, Fluid: 0 %
Lymphs, Fluid: 64 %
Monocyte-Macrophage-Serous Fluid: 20 % — ABNORMAL LOW (ref 50–90)
Neutrophil Count, Fluid: 16 % (ref 0–25)
Total Nucleated Cell Count, Fluid: 540 uL (ref 0–1000)

## 2023-11-17 LAB — BASIC METABOLIC PANEL WITH GFR
Anion gap: 8 (ref 5–15)
BUN: 13 mg/dL (ref 6–20)
CO2: 27 mmol/L (ref 22–32)
Calcium: 8 mg/dL — ABNORMAL LOW (ref 8.9–10.3)
Chloride: 101 mmol/L (ref 98–111)
Creatinine, Ser: 0.57 mg/dL — ABNORMAL LOW (ref 0.61–1.24)
GFR, Estimated: >60 mL/min (ref >60)
Glucose, Bld: 122 mg/dL — ABNORMAL HIGH (ref 70–99)
Potassium: 3.7 mmol/L (ref 3.5–5.1)
Sodium: 136 mmol/L (ref 135–145)

## 2023-11-17 LAB — PROTIME-INR
INR: 1.1 (ref 0.8–1.2)
Prothrombin Time: 14 s (ref 11.4–15.2)

## 2023-11-17 LAB — GRAM STAIN

## 2023-11-17 LAB — PROTEIN, PLEURAL OR PERITONEAL FLUID: Total protein, fluid: 4.1 g/dL

## 2023-11-17 LAB — BLOOD GAS, ARTERIAL
Acid-Base Excess: 3.2 mmol/L — ABNORMAL HIGH (ref 0.0–2.0)
Bicarbonate: 31.5 mmol/L — ABNORMAL HIGH (ref 20.0–28.0)
Drawn by: 72709
FIO2: 100 %
O2 Saturation: 94.7 %
Patient temperature: 36.9
pCO2 arterial: 67 mmHg (ref 32–48)
pH, Arterial: 7.28 — ABNORMAL LOW (ref 7.35–7.45)
pO2, Arterial: 71 mmHg — ABNORMAL LOW (ref 83–108)

## 2023-11-17 LAB — MRSA NEXT GEN BY PCR, NASAL: MRSA by PCR Next Gen: NOT DETECTED

## 2023-11-17 LAB — APTT: aPTT: 29 s (ref 24–36)

## 2023-11-17 MED ORDER — ORAL CARE MOUTH RINSE: 15.0000 mL | OROMUCOSAL | Status: DC | PRN

## 2023-11-17 MED ORDER — LIDOCAINE HCL 1 % IJ SOLN
INTRAMUSCULAR | Status: AC
  Filled 2023-11-17: qty 20

## 2023-11-17 MED ORDER — ORAL CARE MOUTH RINSE
15.0000 mL | OROMUCOSAL | Status: DC
  Administered 2023-11-17 – 2023-11-22 (×22): 15 mL via OROMUCOSAL

## 2023-11-17 MED ORDER — MORPHINE SULFATE (PF) 2 MG/ML IV SOLN
1.0000 mg | Freq: Once | INTRAVENOUS | Status: AC
  Administered 2023-11-17: 1 mg via INTRAVENOUS
  Filled 2023-11-17: qty 1

## 2023-11-17 MED ORDER — CHLORHEXIDINE GLUCONATE CLOTH 2 % EX PADS
6.0000 | MEDICATED_PAD | Freq: Every day | CUTANEOUS | Status: DC
  Administered 2023-11-17 – 2023-11-18 (×2): 6 via TOPICAL

## 2023-11-17 NOTE — Progress Notes (Signed)
 PROGRESS NOTE    Brian [name]  ZOX:[id number] DOB: 09-Nov-1960 DOA: 11/16/2023 PCP: [name], MD   Brief Narrative:62 y.o. male with medical history significant of medical issues as listed below. Patient has chronic right upper extremity palsy.  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 See onc history below. Furhter patient has had frequent right chest thoracentesis to drain fluid of the chest in the past.   [name] seems to have been in usual state of health till approximately 3 or 4 days ago when the patient started having increasing shortness of breath which is principally during periods of exertion such as walking.  It was associated with some cough and congestion/expectoration.  Patient also had increased shortness of breath sensation at night and would occasionally wake up with same.  Patient noticed his SpO2 at home was 87% on room air.  Prompting him to come to the ER today.   Patient reports some lower extremity swelling bilaterally.  However no chest pain no fever.    Another concern for the patient has been increased confusion at home.  For example wife reports patient rush patient rushing downstairs today and saying that his forearm had people inside that needed to be taken out.  There has been similar confusion noted here in the ER tonight.  Patient has gotten out of his stretcher and check the back of it to see if the people there are gone.   Otherwise people patient has been noted to be hypoxic slightly in the ER stabilized on 2 L/min of supplementary oxygen.  Rest of the ER course has been unremarkable.  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  With right pleural effusion He is status post thoracentesis 850 cc of fluid removed from his right . Was on BiPAP when I saw him this morning later they were able to take him of the BiPAP put him on oxygen  and was improving. Continue Rocephin and azithromycin. Bipap at bedtime and prn  ABG 7.28/67/71  #2 acute metabolic toxic encephalopathy -likely multifactorial secondary to #1 and side effect from his chemo [name].  #3 chronic anemia hemoglobin stable  #4 chronic atrophy of the right more than left upper extremity with weakness  #5 adenocarcinoma with metastasis lung cancer oncology following patient due to get his ninth radiation today and the last radiation to be finished tomorrow    Estimated body mass index is 24.38 kg/m as calculated from the following:   Height as of this encounter: 5\' 7"  (1.702 m).   Weight as of this encounter: 70.6 kg.  DVT prophylaxis: lovenox Code Status: full Family Communication:wife at bedside  Disposition Plan:  Status is: Inpatient Remains inpatient appropriate because: acute illness   Consultants: onc ir   Procedures: thora 4/16 right  Antimicrobials: rocephin azithro  Subjective: Placed  on bipap  Appear comfortable   Objective: Vitals:   11/17/23 0918 11/17/23 1000 11/17/23 1100 11/17/23 1123  BP:  (!) 134/91 (!) 157/95   Pulse:  (!) 110 (!) 108   Resp:  (!) 21 (!) 24   Temp:    97.9 F (36.6 C)  TempSrc:    Axillary  SpO2: 100% 100% 98%   Weight:      Height:        Intake/Output Summary (Last 24 hours) at 11/17/2023 1141 Last data filed at 11/17/2023 0742 Gross per 24 hour  Intake 419.43 ml  Output 450 ml  Net -30.57 ml   Filed Weights   11/17/23 0227  Weight: 70.6 kg    Examination:  General exam: Appears chronically ill Respiratory system: diminished bs  Cardiovascular system: tachy Gastrointestinal system: Abdomen is nondistended, soft and nontender. No organomegaly or masses felt. Normal bowel sounds heard. Central nervous system: sleeping on bipap Extremities: trace edema    Data Reviewed: I have personally reviewed following labs and imaging studies  CBC: Recent Labs  Lab 11/16/23 1547  11/17/23 0313  WBC 16.9* 19.5*  NEUTROABS 12.3*  --   HGB 9.2* 9.7*  HCT 28.3* 30.6*  MCV 91.3 93.0  PLT 397 376   Basic Metabolic Panel: Recent Labs  Lab 11/16/23 1547 11/17/23 0313  NA 138 136  K 3.2* 3.7  CL 102 101  CO2 26 27  GLUCOSE 130* 122*  BUN 19 13  CREATININE 0.73 0.57*  CALCIUM 8.8* 8.0*   GFR: Estimated Creatinine Clearance: 91.8 mL/min (A) (by C-G formula based on SCr of 0.57 mg/dL (L)). Liver Function Tests: Recent Labs  Lab 11/16/23 1547  AST 28  ALT 23  ALKPHOS 107  BILITOT 0.4  PROT 6.5  ALBUMIN 2.9*   No results for input(s): "LIPASE", "AMYLASE" in the last 168 hours. Recent Labs  Lab 11/16/23 1547  AMMONIA 23   Coagulation Profile: Recent Labs  Lab 11/17/23 0313  INR 1.1   Cardiac Enzymes: No results for input(s): "CKTOTAL", "CKMB", "CKMBINDEX", "TROPONINI" in the last 168 hours. BNP (last 3 results) No results for input(s): "PROBNP" in the last 8760 hours. HbA1C: No results for input(s): "HGBA1C" in the last 72 hours. CBG: No results for input(s): "GLUCAP" in the last 168 hours. Lipid Profile: No results for input(s): "CHOL", "HDL", "LDLCALC", "TRIG", "CHOLHDL", "LDLDIRECT" in the last 72 hours. Thyroid Function Tests: No results for input(s): "TSH", "T4TOTAL", "FREET4", "T3FREE", "THYROIDAB" in the last 72 hours. Anemia Panel: No results for input(s): "VITAMINB12", "FOLATE", "FERRITIN", "TIBC", "IRON", "RETICCTPCT" in the last 72 hours. Sepsis Labs: Recent Labs  Lab 11/16/23 1745 11/16/23 2050  LATICACIDVEN 1.0 1.1    Recent Results (from the past 240 hours)  Blood culture (routine x 2)     Status: None (Preliminary result)   Collection Time: 11/16/23  5:45 PM   Specimen: BLOOD  Result Value Ref Range Status   Specimen Description   Final    BLOOD LEFT ANTECUBITAL Performed at [facility], [address] [place], [place] [address]    Special Requests   Final    BOTTLES DRAWN AEROBIC AND  ANAEROBIC Blood Culture results may not be optimal due to an inadequate volume of blood received in culture bottles Performed at [facility], [address] [place], [place] [address]    Culture   Final    NO GROWTH < 24 HOURS Performed at [facility], [address] [place], [place] [address]    Report Status PENDING  Incomplete  Blood culture (routine x 2)     Status: None (Preliminary result)   Collection Time: 11/16/23  5:45 PM   Specimen: BLOOD  Result Value Ref Range Status   Specimen Description   Final    BLOOD RIGHT ANTECUBITAL Performed at [facility], [address], [place], [place] [address]    Special Requests   Final    BOTTLES DRAWN AEROBIC AND ANAEROBIC Blood Culture results may not be optimal due to an inadequate volume  of blood received in culture bottles Performed at [facility], [address] [place], [place] [address]    Culture   Final    NO GROWTH < 24 HOURS Performed at [facility], [address] [place], [place] [address]    Report Status PENDING  Incomplete  MRSA Next Gen by PCR, Nasal     Status: None   Collection Time: 11/17/23  5:42 AM   Specimen: Nasal Mucosa; Nasal Swab  Result Value Ref Range Status   MRSA by PCR Next Gen NOT DETECTED NOT DETECTED Final    Comment: (NOTE) The GeneXpert MRSA Assay (FDA approved for NASAL specimens only), is one component of a comprehensive MRSA colonization surveillance program. It is not intended to diagnose MRSA infection nor to guide or monitor treatment for MRSA infections. Test performance is not FDA approved in patients less than 54 years old. Performed at [facility], [address] [place], [place] [address]          Radiology Studies: DG Chest 1 View Result Date: 11/16/2023 CLINICAL DATA:  confusion, cancer history EXAM: CHEST  1 VIEW COMPARISON:  08/27/2023 FINDINGS: Interval development of  moderate right pleural effusion, with atelectasis/consolidation the right lung base. There is some linear right suprahilar scarring or atelectasis. Left lung clear. Heart size and mediastinal contours are within normal limits. Small caliber catheter placement through the left neck, chest, and upper abdomen. Visualized bones unremarkable. IMPRESSION: Moderate right pleural effusion with right basilar atelectasis/consolidation. Electronically Signed   By: Corlis Leak M.D.   On: 11/16/2023 17:56   CT HEAD WO CONTRAST Result Date: 11/16/2023 CLINICAL DATA:  Mental status change of unknown cause. Head and neck cancer being treated with radiation. EXAM: CT HEAD WITHOUT CONTRAST TECHNIQUE: Contiguous axial images were obtained from the base of the skull through the vertex without intravenous contrast. RADIATION DOSE REDUCTION: This exam was performed according to the departmental dose-optimization program which includes automated exposure control, adjustment of the mA and/or kV according to patient size and/or use of iterative reconstruction technique. COMPARISON:  10/15/2022 FINDINGS: Brain: The brain shows a normal appearance without evidence of malformation, atrophy, old or acute small or large vessel infarction, mass lesion, hemorrhage, hydrocephalus or extra-axial collection. Vascular: No hyperdense vessel. No evidence of atherosclerotic calcification. Skull: Normal.  No traumatic finding.  No focal bone lesion. Sinuses/Orbits: Sinuses are clear. Orbits appear normal. Mastoids are clear. Other: Upper cervical neurostimulator in place. IMPRESSION: Normal head CT. Upper cervical neurostimulator in place. Electronically Signed   By: Paulina Fusi M.D.   On: 11/16/2023 16:21    Scheduled Meds:  budeson-glycopyrrolate-formoterol  2 puff Inhalation BID   Chlorhexidine Gluconate Cloth  6 each Topical Daily   enoxaparin (LOVENOX) injection  40 mg Subcutaneous Q24H   furosemide       furosemide       levothyroxine   100 mcg Oral QAC breakfast   mouth rinse  15 mL Mouth Rinse 4 times per day   sodium chloride flush  3 mL Intravenous Q12H   Continuous Infusions:  azithromycin     cefTRIAXone (ROCEPHIN)  IV Stopped (11/17/23 1011)     LOS: 1 day    Time spent: 39 min   Alwyn Ren, MD  11/17/2023, 11:41 AM

## 2023-11-17 NOTE — TOC Initial Note (Signed)
 Transition of Care [facility]) - Initial/Assessment Note    Patient Details  Name: Brian [name] MRN: [id number] Date of Birth: September 07, 1960  Transition of Care [facility]) CM/SW Contact:    Diona Browner, LCSW Phone Number: 11/17/2023, 8:46 AM  Clinical Narrative:                 Pt from home with spouse. Pt continues medical workup. Wife is pt support system. TOC following for possible d/c needs.     Barriers to Discharge: Continued Medical Work up   Patient Goals and CMS Choice Patient states their goals for this hospitalization and ongoing recovery are:: return home CMS Medicare.gov Compare Post Acute Care list provided to::  (NA) Choice offered to / list presented to : NA [place] ownership interest in [facility].provided to::  (NA)    Expected Discharge Plan and Services In-house Referral: NA     Living arrangements for the past 2 months: Single Family Home                 DME Arranged: N/A DME Agency: NA       HH Arranged: NA HH Agency: NA        Prior Living Arrangements/Services Living arrangements for the past 2 months: Single Family Home   Patient language and need for interpreter reviewed:: Yes Do you feel safe going back to the place where you live?: Yes      Need for Family Participation in Patient Care: Yes (Comment) Care giver support system in place?: Yes (comment)   Criminal Activity/Legal Involvement Pertinent to Current Situation/Hospitalization: No - Comment as needed  Activities of Daily Living      Permission Sought/Granted                  Emotional Assessment Appearance:: Appears stated age     Orientation: : Fluctuating Orientation (Suspected and/or reported Sundowners) Alcohol / Substance Use: Not Applicable Psych Involvement: No (comment)  Admission diagnosis:  Hallucinations [R44.3] Pneumonia [J18.9] Pneumonia of right lung due to infectious organism, unspecified part of lung [J18.9] Patient Active Problem List    Diagnosis Date Noted   Pneumonia 11/16/2023   AMS (altered mental status) 11/16/2023   Complex regional pain syndrome type 1 of left lower extremity 07/20/2023   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 07/20/2023   Chronic pain syndrome 07/20/2023   Complex regional pain syndrome type 1 of right upper extremity 05/04/2023   Right arm pain 05/04/2023   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   Recurrent right pleural effusion 07/23/2021   Hypothyroidism 10/24/2020   Acute hypoxemic respiratory failure (HCC) 05/19/2020   Acute respiratory failure with hypoxia (HCC) 05/18/2020   Essential hypertension 05/18/2020   Type 2 diabetes mellitus with hyperlipidemia (HCC) 05/18/2020   S/P thoracentesis 03/21/2020   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/05/2020   Calf swelling 02/21/2020   Rash 02/07/2020   Adenocarcinoma of right lung, stage 4 (HCC) 02/07/2020   Chronic cough 12/20/2019   Venous thrombosis 09/21/2019   Shortness of breath 09/07/2019   Headache 08/09/2019   Lobar pneumonia, unspecified organism (HCC) 03/07/2019   Centrilobular emphysema (HCC) 03/07/2019   Encounter for antineoplastic immunotherapy 11/10/2018   Adenocarcinoma of right lung, stage 3 (HCC) 08/25/2018   Goals of care, counseling/discussion 08/25/2018   Encounter for antineoplastic chemotherapy 08/25/2018   Mass of upper lobe of right lung 08/04/2018   Subarachnoid hemorrhage (HCC) 05/22/2015   PCP:  [name], MD Pharmacy:  No  Pharmacies Listed    Social Drivers of Health (SDOH) Social History: SDOH Screenings   Food Insecurity: No Food Insecurity (10/19/2022)  Housing: Low Risk  (10/19/2022)  Transportation Needs: No Transportation Needs (10/19/2022)  Utilities: Not At Risk (10/19/2022)  Depression (PHQ2-9): Low Risk  (09/01/2023)  Tobacco Use: Medium Risk (10/26/2023)   SDOH Interventions:     Readmission Risk Interventions    11/17/2023    8:45 AM  Readmission Risk  Prevention Plan  Transportation Screening Complete  PCP or Specialist Appt within 5-7 Days Complete  Home Care Screening Complete  Medication Review (RN CM) Complete

## 2023-11-17 NOTE — Significant Event (Signed)
 Rapid Response Event Note   Reason for Call :  Agitation and O2 sats in the 80's on 3L [place]  Initial Focused Assessment:  2312- Upon assessment patient agitated after already receiving ativan prn. Charge RN on 5th floor gave a breathing treatment prior to my arrival and had bumped his O2 to 5L [place]. Patients lung sounds where diminished on both side and was using accessory muscles. On call coverage placed orders for haldol and 40 MG of lasix (see mar). ABG was ordered and respiratory is at bedside to collect. Rapid response nurse placed patient on non-rebreather and O2 sats recovered to 95%.   0000-ABG was unable to result off the tube that previously sent, new order in and awaiting results.   0108- ABG resulted, patient to be transferred to SD for bipap.  Interventions:  40 lasix Haldol (see mar) ABG  Plan of Care:  Awaiting ABG results   Event Summary:   MD Notified: Army Landsman, NP Call Time: 2312 Arrival Time: 2316 End Time: 1308  Fayne Hoover, RN

## 2023-11-17 NOTE — Procedures (Signed)
 PROCEDURE SUMMARY:  Successful image-guided right thoracentesis. Yielded 820 mL of yellow fluid. Patient tolerated procedure well. EBL: trace No immediate complications.  Specimen was sent for labs. Post procedure CXR shows no pneumothorax.  Please see imaging section of Epic for full dictation.  Damian Duke Derwood Becraft PA-C 11/17/2023 1:53 PM

## 2023-11-17 NOTE — Progress Notes (Signed)
   11/17/23 1954  BiPAP/CPAP/SIPAP  BiPAP/CPAP/SIPAP Pt Type Adult  Reason BIPAP/CPAP not in use Other(comment) (Bipap not indicated at this time, on standby)

## 2023-11-17 NOTE — Progress Notes (Cosign Needed)
 RT took pt off Bipap and placed on 10L HFNC. Pt doing well at this time

## 2023-11-17 NOTE — Progress Notes (Addendum)
 Brian [name]   DOB:05-25-1961   ZO#:[id number]      ASSESSMENT & PLAN:  Non-small cell lung cancer with metastasis Pleural effusion -Initially diagnosed January 2020 with recurrence in June 2021 - Status post concurrent chemoradiation therapy with weekly carbo and paclitaxel.  Also status post immunotherapy with Imfinzi.  Subsequently received [place] and [place]. - More recently was on [name] p.o. however due to progression of disease was switched to [name]. -PET scan done as outpatient on 10/22/2023 showed bilateral hypermetabolic lymph nodes in the lower neck and upper thoracic inlet consistent with mets.  Large right pleural effusion was also shown. - Has been receiving radiation to bilateral cervical lymph nodes, last day was expected to be 11/17/2023 -Order for ultrasound guided thoracentesis today, will follow cytology. - Medical oncology/Dr. Shirline Frees following closely.  Anemia -Hemoglobin 9.7 today - Likely secondary to malignancy - Transfuse PRBC for Hgb <7.0.  No transfusional intervention required at this time - Continue to monitor CBC with differential  Leukocytosis Pneumonia -WBC elevated 19.5 - Monitor fever curve, currently afebrile - Continue antibiotics as ordered  Altered mental status Agitation -Wife stated patient had hallucinations at home and subsequently brought him into the ED - CT of head done 11/16/2023 is negative. -Status post Ativan and Haldol - Supportive care.  Monitor closely  Chronic neuropathy and weakness - Patient with bilateral arm pain weakness and atrophy of upper extremities, right worse than left  Code Status Full  Subjective:  Patient seen asleep in bed.  Easily awakens however he is lethargic and opens his eyes slightly.  Asking for water.  Denies pain.  No other acute distress is noted at this time.  Objective:  Vitals:   11/17/23 1100 11/17/23 1123  BP: (!) 157/95   Pulse: (!) 108   Resp: (!) 24   Temp:  97.9  F (36.6 C)  SpO2: 98%      Intake/Output Summary (Last 24 hours) at 11/17/2023 1128 Last data filed at 11/17/2023 9811 Gross per 24 hour  Intake 419.43 ml  Output 450 ml  Net -30.57 ml     PHYSICAL EXAMINATION: ECOG PERFORMANCE STATUS: 4 - Bedbound  Vitals:   11/17/23 1100 11/17/23 1123  BP: (!) 157/95   Pulse: (!) 108   Resp: (!) 24   Temp:  97.9 F (36.6 C)  SpO2: 98%    Filed Weights   11/17/23 0227  Weight: 155 lb 10.3 oz (70.6 kg)    GENERAL: alert, no distress and comfortable SKIN: skin color, texture, turgor are normal, no rashes or significant lesions EYES: normal, conjunctiva are pink and non-injected, sclera clear OROPHARYNX: no exudate, no erythema and lips, buccal mucosa, and tongue normal  NECK: supple, thyroid normal size, non-tender, without nodularity LYMPH: no palpable lymphadenopathy in the cervical, axillary or inguinal LUNGS: clear to auscultation and percussion with normal breathing effort HEART: regular rate & rhythm and no murmurs and no lower extremity edema ABDOMEN: abdomen soft, non-tender and normal bowel sounds MUSCULOSKELETAL: no cyanosis of digits and no clubbing  PSYCH: alert & oriented x 3 with fluent speech NEURO: no focal motor/sensory deficits   All questions were answered. The patient knows to call the clinic with any problems, questions or concerns.   The total time spent in the appointment was 40 minutes encounter with patient including review of chart and various tests results, discussions about plan of care and coordination of care plan  Dawson Bills, NP 11/17/2023 11:28 AM    Labs  Reviewed:  Lab Results  Component Value Date   WBC 19.5 (H) 11/17/2023   HGB 9.7 (L) 11/17/2023   HCT 30.6 (L) 11/17/2023   MCV 93.0 11/17/2023   PLT 376 11/17/2023   Recent Labs    10/13/23 0744 11/03/23 1411 11/16/23 1547 11/17/23 0313  NA 139 140 138 136  K 3.4* 3.5 3.2* 3.7  CL 102 103 102 101  CO2 31 32 26 27  GLUCOSE 151*  115* 130* 122*  BUN 14 20 19 13   CREATININE 0.82 0.77 0.73 0.57*  CALCIUM 8.4* 9.1 8.8* 8.0*  GFRNONAA >60 >60 >60 >60  PROT 6.2* 6.4* 6.5  --   ALBUMIN 3.6 3.7 2.9*  --   AST 28 22 28   --   ALT 26 20 23   --   ALKPHOS 304* 237* 107  --   BILITOT 1.0 1.2 0.4  --     Studies Reviewed:  DG Chest 1 View Result Date: 11/16/2023 CLINICAL DATA:  confusion, cancer history EXAM: CHEST  1 VIEW COMPARISON:  08/27/2023 FINDINGS: Interval development of moderate right pleural effusion, with atelectasis/consolidation the right lung base. There is some linear right suprahilar scarring or atelectasis. Left lung clear. Heart size and mediastinal contours are within normal limits. Small caliber catheter placement through the left neck, chest, and upper abdomen. Visualized bones unremarkable. IMPRESSION: Moderate right pleural effusion with right basilar atelectasis/consolidation. Electronically Signed   By: Corlis Leak M.D.   On: 11/16/2023 17:56   CT HEAD WO CONTRAST Result Date: 11/16/2023 CLINICAL DATA:  Mental status change of unknown cause. Head and neck cancer being treated with radiation. EXAM: CT HEAD WITHOUT CONTRAST TECHNIQUE: Contiguous axial images were obtained from the base of the skull through the vertex without intravenous contrast. RADIATION DOSE REDUCTION: This exam was performed according to the departmental dose-optimization program which includes automated exposure control, adjustment of the mA and/or kV according to patient size and/or use of iterative reconstruction technique. COMPARISON:  10/15/2022 FINDINGS: Brain: The brain shows a normal appearance without evidence of malformation, atrophy, old or acute small or large vessel infarction, mass lesion, hemorrhage, hydrocephalus or extra-axial collection. Vascular: No hyperdense vessel. No evidence of atherosclerotic calcification. Skull: Normal.  No traumatic finding.  No focal bone lesion. Sinuses/Orbits: Sinuses are clear. Orbits appear  normal. Mastoids are clear. Other: Upper cervical neurostimulator in place. IMPRESSION: Normal head CT. Upper cervical neurostimulator in place. Electronically Signed   By: Paulina Fusi M.D.   On: 11/16/2023 16:21   NM PET Image Restage (PS) Skull Base to Thigh (F-18 FDG) Result Date: 10/26/2023 CLINICAL DATA:  Subsequent treatment strategy for non-small cell lung cancer. EXAM: NUCLEAR MEDICINE PET SKULL BASE TO THIGH TECHNIQUE: 7.6 mCi F-18 FDG was injected intravenously. Full-ring PET imaging was performed from the skull base to thigh after the radiotracer. CT data was obtained and used for attenuation correction and anatomic localization. Fasting blood glucose: 103 mg/dl COMPARISON:  16/05/9603 FINDINGS: NECK: Bilateral intensely hypermetabolic lymph nodes in the lower neck/upper thoracic inlet. For example lymph nodes the posterior lower LEFT neck measuring 9 mm and 15 mm on image 27/4 have intense metabolic activity SUV max equal 13.9 and SUV max equalc 7.6. Hypermetabolic RIGHT supraclavicular node measures 21 mm on image 31 with SUV max equal 12.8. Incidental CT findings: None. CHEST: Hypermetabolic lymph node inferior to the carina adjacent to the esophagus and RIGHT mainstem bronchus SUV max equal 23.8 on image 37. This node is difficult to place on noncontrast CT.  No hypermetabolic pulmonary nodules. Large layering RIGHT pleural effusion. Incidental CT findings: Stimulator pack in the posteriorly LEFT back with electrodes extending into the posterior cervical region. ABDOMEN/PELVIS: No abnormal hypermetabolic activity within the liver, pancreas, adrenal glands, or spleen. No hypermetabolic lymph nodes in the abdomen or pelvis. Incidental CT findings: None. SKELETON: No focal hypermetabolic activity to suggest skeletal metastasis. Incidental CT findings: None. IMPRESSION: 1. Bilateral hypermetabolic lymph nodes in the lower neck/upper thoracic inlet consistent with nodal metastasis. 2. Hypermetabolic  lymph node inferior to the carina consistent with nodal metastasis. 3. No lung nodules. 4. Large layering RIGHT pleural effusion. Electronically Signed   By: Deboraha Fallow M.D.   On: 10/26/2023 10:53     ADDENDUM: Hematology/Oncology Attending: The patient is seen and examined today.  I agree with the above note.  This is a very pleasant 63 years old white male with metastatic non-small cell lung cancer, adenocarcinoma that was initially diagnosed in January 2020 has a stage IIIb status post a course of concurrent chemoradiation followed by consolidation immunotherapy with Imfinzi for 1 year.  The patient had a positive ALK gene translocation and he was found to have disease progression and started treatment with alectinib 600 mg p.o. twice daily for around 44 months before it was discontinued in March 2025 secondary to disease progression with progressive bilateral cervical and supraclavicular lymphadenopathy. We switched his treatment to [name] 100 mg p.o daily.  Unfortunately the patient has a rough time with this treatment with increased hallucination and psychiatric issues which could be secondary to his treatment with [name] as a known side effects.  He also has significant shortness of breath and he was admitted to the hospital for these complaints.  He had ultrasound-guided right thoracentesis with drainage of 820 mL of pleural fluid.  He is a little bit better but still has significant shortness of breath and very weak. I had a discussion with the patient today about his condition and treatment options. I would definitely discontinue his treatment with [name] for now because of the possibility of psychiatric adverse effect and hallucination.  I will discuss with the patient other treatment options on outpatient basis after discharge from the hospital. A discussion of the goals of care with the palliative care team is very reasonable at this point. The patient is in agreement with  the current plan. Thank you so much for taking good care of Mr. Bastyr.  Will continue to follow-up the patient with you and assist in his management on as-needed basis. Disclaimer: This note was dictated with voice recognition software. Similar sounding words can inadvertently be transcribed and may be missed upon review.  Aurelio Blower, MD

## 2023-11-17 NOTE — Progress Notes (Signed)
   11/17/23 0509  BiPAP/CPAP/SIPAP  BiPAP/CPAP/SIPAP Pt Type Adult  BiPAP/CPAP/SIPAP V60  Mask Type Full face mask  Dentures removed? Not applicable  Mask Size Large  Set Rate 16 breaths/min  Respiratory Rate 23 breaths/min  IPAP 16 cmH20  EPAP 8 cmH2O  FiO2 (%) 50 %  Minute Ventilation 12.3  Leak 16  Peak Inspiratory Pressure (PIP) 17  Tidal Volume (Vt) 559  Patient Home Machine No  Patient Home Mask No  Patient Home Tubing No  Auto Titrate No  Press High Alarm 20 cmH2O  Press Low Alarm 5 cmH2O  Device Plugged into RED Power Outlet Yes

## 2023-11-17 NOTE — Progress Notes (Signed)
   11/17/23 1540  Spiritual Encounters  Type of Visit Initial  Care provided to: Pt and family  Referral source Family  Reason for visit Routine spiritual support  OnCall Visit No   Patient's spouse invited chaplain into room to visit with patient. Talked with patient. Patient stated thst "Chaplain Dan" usually visits but that I was welcome to visit and pray with him. Wife was at bedside. Provided spiritual care to both patient and wife along with prayer. Patient invited chaplain for a return visit. Spouse was leaving for the day and thanked chaplain as well.

## 2023-11-17 NOTE — Plan of Care (Signed)
  Problem: Education: Goal: Knowledge of General Education information will improve Description: Including pain rating scale, medication(s)/side effects and non-pharmacologic comfort measures Outcome: Progressing   Problem: Health Behavior/Discharge Planning: Goal: Ability to manage health-related needs will improve Outcome: Progressing   Problem: Clinical Measurements: Goal: Ability to maintain clinical measurements within normal limits will improve Outcome: Progressing Goal: Will remain free from infection Outcome: Progressing Goal: Diagnostic test results will improve Outcome: Progressing Goal: Respiratory complications will improve Outcome: Progressing Goal: Cardiovascular complication will be avoided Outcome: Progressing   Problem: Activity: Goal: Risk for activity intolerance will decrease Outcome: Progressing   Problem: Nutrition: Goal: Adequate nutrition will be maintained Outcome: Progressing   Problem: Coping: Goal: Level of anxiety will decrease Outcome: Progressing   Problem: Elimination: Goal: Will not experience complications related to bowel motility Outcome: Progressing Goal: Will not experience complications related to urinary retention Outcome: Progressing   Problem: Pain Managment: Goal: General experience of comfort will improve and/or be controlled Outcome: Progressing   Problem: Safety: Goal: Ability to remain free from injury will improve Outcome: Progressing   Problem: Skin Integrity: Goal: Risk for impaired skin integrity will decrease Outcome: Progressing   Problem: Safety: Goal: Non-violent Restraint(s) Outcome: Completed/Met   Cindy S. Bernetta Brilliant BSN, RN, [employer], CCRN 11/17/2023 6:35 AM

## 2023-11-18 ENCOUNTER — Ambulatory Visit

## 2023-11-18 ENCOUNTER — Other Ambulatory Visit: Payer: Self-pay

## 2023-11-18 ENCOUNTER — Ambulatory Visit
Admission: RE | Admit: 2023-11-18 | Discharge: 2023-11-18 | Disposition: A | Discharge status: Home / Self Care | Source: Ambulatory Visit | Attending: Radiation Oncology | Admitting: Radiation Oncology

## 2023-11-18 DIAGNOSIS — C778 Secondary and unspecified malignant neoplasm of lymph nodes of multiple regions: Secondary | ICD-10-CM | POA: Diagnosis not present

## 2023-11-18 DIAGNOSIS — J189 Pneumonia, unspecified organism: Secondary | ICD-10-CM | POA: Diagnosis not present

## 2023-11-18 DIAGNOSIS — Z515 Encounter for palliative care: Secondary | ICD-10-CM

## 2023-11-18 DIAGNOSIS — Z510 Encounter for antineoplastic radiation therapy: Secondary | ICD-10-CM | POA: Diagnosis not present

## 2023-11-18 DIAGNOSIS — C3411 Malignant neoplasm of upper lobe, right bronchus or lung: Secondary | ICD-10-CM | POA: Diagnosis not present

## 2023-11-18 LAB — RAD ONC ARIA SESSION SUMMARY
Course Elapsed Days: 14
Plan Fractions Treated to Date: 9
Plan Prescribed Dose Per Fraction: 3 Gy
Plan Total Fractions Prescribed: 10
Plan Total Prescribed Dose: 30 Gy
Reference Point Dosage Given to Date: 27 Gy
Reference Point Session Dosage Given: 3 Gy
Session Number: 9

## 2023-11-18 LAB — COMPREHENSIVE METABOLIC PANEL WITH GFR
ALT: 22 U/L (ref 0–44)
AST: 24 U/L (ref 15–41)
Albumin: 2.6 g/dL — ABNORMAL LOW (ref 3.5–5.0)
Alkaline Phosphatase: 102 U/L (ref 38–126)
Anion gap: 11 (ref 5–15)
BUN: 11 mg/dL (ref 6–20)
CO2: 26 mmol/L (ref 22–32)
Calcium: 8.6 mg/dL — ABNORMAL LOW (ref 8.9–10.3)
Chloride: 98 mmol/L (ref 98–111)
Creatinine, Ser: 0.59 mg/dL — ABNORMAL LOW (ref 0.61–1.24)
GFR, Estimated: >60 mL/min (ref >60)
Glucose, Bld: 96 mg/dL (ref 70–99)
Potassium: 3.5 mmol/L (ref 3.5–5.1)
Sodium: 135 mmol/L (ref 135–145)
Total Bilirubin: 0.7 mg/dL (ref 0.0–1.2)
Total Protein: 6.2 g/dL — ABNORMAL LOW (ref 6.5–8.1)

## 2023-11-18 LAB — CBC
HCT: 30.8 % — ABNORMAL LOW (ref 39.0–52.0)
Hemoglobin: 9.8 g/dL — ABNORMAL LOW (ref 13.0–17.0)
MCH: 29.1 pg (ref 26.0–34.0)
MCHC: 31.8 g/dL (ref 30.0–36.0)
MCV: 91.4 fL (ref 80.0–100.0)
Platelets: 403 10*3/uL — ABNORMAL HIGH (ref 150–400)
RBC: 3.37 MIL/uL — ABNORMAL LOW (ref 4.22–5.81)
RDW: 19.3 % — ABNORMAL HIGH (ref 11.5–15.5)
WBC: 19.4 10*3/uL — ABNORMAL HIGH (ref 4.0–10.5)
nRBC: 0 % (ref 0.0–0.2)

## 2023-11-18 LAB — PATHOLOGIST SMEAR REVIEW

## 2023-11-18 MED ORDER — LORAZEPAM 2 MG/ML IJ SOLN: 2.0000 mg | Freq: Four times a day (QID) | INTRAMUSCULAR | Status: DC | PRN

## 2023-11-18 MED ORDER — LORAZEPAM 2 MG/ML IJ SOLN
2.0000 mg | Freq: Three times a day (TID) | INTRAMUSCULAR | Status: DC | PRN
Start: 2023-11-18 — End: 2023-11-19
  Administered 2023-11-19: 2 mg via INTRAVENOUS
  Filled 2023-11-18: qty 1

## 2023-11-18 NOTE — Progress Notes (Signed)
 PROGRESS NOTE    Brian [name]  ZOX:[id number] DOB: 19-Jun-1961 DOA: 11/16/2023 PCP: [name], MD   Brief Narrative:63 y.o. male with medical history significant of medical issues as listed below. Patient has chronic right upper extremity palsy.  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 See onc history below. Furhter patient has had frequent right chest thoracentesis to drain fluid of the chest in the past.   [name] seems to have been in usual state of health till approximately 3 or 4 days ago when the patient started having increasing shortness of breath which is principally during periods of exertion such as walking.  It was associated with some cough and congestion/expectoration.  Patient also had increased shortness of breath sensation at night and would occasionally wake up with same.  Patient noticed his SpO2 at home was 87% on room air.  Prompting him to come to the ER today.   Patient reports some lower extremity swelling bilaterally.  However no chest pain no fever.    Another concern for the patient has been increased confusion at home.  For example wife reports patient rush patient rushing downstairs today and saying that his forearm had people inside that needed to be taken out.  There has been similar confusion noted here in the ER tonight.  Patient has gotten out of his stretcher and check the back of it to see if the people there are gone.   Otherwise people patient has been noted to be hypoxic slightly in the ER stabilized on 2 L/min of supplementary oxygen.  Rest of the ER course has been unremarkable.  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  With right pleural effusion He is status post thoracentesis 850 cc of fluid removed from his right . Oxygen requirement improving now on 4 L  Continue Rocephin and azithromycin. Bipap at bedtime and prn   ABG 7.28/67/71  #2 acute metabolic toxic encephalopathy -likely multifactorial secondary to #1 and side effect from his chemo [name].  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   Weight as of this encounter: 70.6 kg.  DVT prophylaxis: lovenox Code Status: full Family Communication:wife at bedside  Disposition Plan:  Status is: Inpatient Remains inpatient appropriate because: acute illness   Consultants: onc ir   Procedures: thora 4/16 right  Antimicrobials: rocephin azithro  Subjective: On 4 L O2 Had xrt today    Objective: Vitals:   11/18/23 1000 11/18/23 1100 11/18/23 1200 11/18/23 1300  BP: (!) 159/84 (!) 142/88    Pulse: 87 (!) 105 (!) 106 (!) 111  Resp: (!) 22 (!) 26 (!) 24 (!) 27  Temp:   98.4 F (36.9 C)   TempSrc:   Oral   SpO2: (!) 82% 91% 99% 100%  Weight:      Height:        Intake/Output Summary (Last 24 hours) at 11/18/2023 1404 Last data filed at 11/18/2023 1001 Gross per 24 hour  Intake 210.02 ml  Output 1400 ml  Net -1189.98 ml   Filed Weights   11/17/23 0227  Weight: 70.6 kg    Examination:  General exam: Appears chronically ill Respiratory system: diminished bs  Cardiovascular system: tachy Gastrointestinal system: Abdomen is nondistended, soft and nontender. No organomegaly or masses felt. Normal bowel sounds heard. Central nervous system: awake alert Extremities: trace edema    Data Reviewed: I have personally reviewed following labs and imaging studies  CBC: Recent Labs  Lab 11/16/23 1547 11/17/23 0313 11/18/23 0305  WBC 16.9* 19.5* 19.4*  NEUTROABS 12.3*  --   --   HGB 9.2* 9.7* 9.8*  HCT 28.3* 30.6* 30.8*  MCV 91.3 93.0 91.4  PLT 397 376  403*   Basic Metabolic Panel: Recent Labs  Lab 11/16/23 1547 11/17/23 0313 11/18/23 0305  NA 138 136 135  K 3.2* 3.7 3.5  CL 102 101 98  CO2 26 27 26   GLUCOSE 130* 122* 96  BUN 19 13 11   CREATININE 0.73 0.57* 0.59*  CALCIUM 8.8* 8.0* 8.6*   GFR: Estimated Creatinine Clearance: 91.8 mL/min (A) (by C-G formula based on SCr of 0.59 mg/dL (L)). Liver Function Tests: Recent Labs  Lab 11/16/23 1547 11/18/23 0305  AST 28 24  ALT 23 22  ALKPHOS 107 102  BILITOT 0.4 0.7  PROT 6.5 6.2*  ALBUMIN 2.9* 2.6*   No results for input(s): "LIPASE", "AMYLASE" in the last 168 hours. Recent Labs  Lab 11/16/23 1547  AMMONIA 23   Coagulation Profile: Recent Labs  Lab 11/17/23 0313  INR 1.1   Cardiac Enzymes: No results for input(s): "CKTOTAL", "CKMB", "CKMBINDEX", "TROPONINI" in the last 168 hours. BNP (last 3 results) No results for input(s): "PROBNP" in the last 8760 hours. HbA1C: No results for input(s): "HGBA1C" in the last 72 hours. CBG: No results for input(s): "GLUCAP" in the last 168 hours. Lipid Profile: No results for input(s): "CHOL", "HDL", "LDLCALC", "TRIG", "CHOLHDL", "LDLDIRECT" in the last 72 hours. Thyroid Function Tests: No results for input(s): "TSH", "T4TOTAL", "FREET4", "T3FREE", "THYROIDAB" in the last 72 hours. Anemia Panel: No results for input(s): "VITAMINB12", "FOLATE", "FERRITIN", "TIBC", "IRON", "RETICCTPCT" in the last 72 hours. Sepsis Labs: Recent Labs  Lab 11/16/23 1745 11/16/23 2050  LATICACIDVEN 1.0 1.1    Recent Results (from the past 240 hours)  Blood culture (routine x 2)     Status: None (Preliminary result)   Collection Time: 11/16/23  5:45 PM   Specimen: BLOOD  Result Value Ref Range Status   Specimen Description   Final    BLOOD LEFT ANTECUBITAL Performed at [facility], [address] [place], [place] [address]    Special Requests   Final    BOTTLES DRAWN AEROBIC AND ANAEROBIC Blood Culture results may  not be optimal due to an inadequate volume of blood received in culture bottles Performed at [facility], [address], [place], [place] [address]    Culture   Final    NO GROWTH 2 DAYS Performed at [facility], [address], [place], [place] [address]    Report Status PENDING  Incomplete  Blood culture (routine x 2)     Status: None (Preliminary result)   Collection Time: 11/16/23  5:45 PM   Specimen: BLOOD  Result Value Ref Range Status   Specimen Description   Final    BLOOD RIGHT ANTECUBITAL Performed at [facility], [address], [place], [place] [address]    Special Requests   Final    BOTTLES DRAWN AEROBIC AND ANAEROBIC Blood Culture results may not be optimal due to an inadequate volume of blood  received in culture bottles Performed at [facility], [address] [place], [place] [address]    Culture   Final    NO GROWTH 2 DAYS Performed at [facility], [address] [place], [place] [address]    Report Status PENDING  Incomplete  MRSA Next Gen by PCR, Nasal     Status: None   Collection Time: 11/17/23  5:42 AM   Specimen: Nasal Mucosa; Nasal Swab  Result Value Ref Range Status   MRSA by PCR Next Gen NOT DETECTED NOT DETECTED Final    Comment: (NOTE) The GeneXpert MRSA Assay (FDA approved for NASAL specimens only), is one component of a comprehensive MRSA colonization surveillance program. It is not intended to diagnose MRSA infection nor to guide or monitor treatment for MRSA infections. Test performance is not FDA approved in patients less than 81 years old. Performed at [facility], [address], [place], [place] [address]   Culture, body fluid w Gram Stain-bottle     Status: None (Preliminary result)   Collection Time: 11/17/23  1:45 PM   Specimen: Pleura  Result Value Ref Range Status   Specimen Description PLEURAL  Final   Special Requests NONE  Final    Culture   Final    NO GROWTH < 24 HOURS Performed at [facility], [address] [place], [place] [address]    Report Status PENDING  Incomplete  Gram stain     Status: None   Collection Time: 11/17/23  1:45 PM   Specimen: Pleura  Result Value Ref Range Status   Specimen Description PLEURAL  Final   Special Requests NONE  Final   Gram Stain   Final    WBC PRESENT,BOTH PMN AND MONONUCLEAR NO ORGANISMS SEEN CYTOSPIN SMEAR Performed at [facility], [address] [place], [place] [address]    Report Status 11/17/2023 FINAL  Final         Radiology Studies: VAS [place] LOWER EXTREMITY VENOUS (DVT) Result Date: 11/17/2023  Lower Venous DVT Study Patient Name:  RAWLIN REAUME  Date of Exam:   11/17/2023 Medical Rec #: [id number]         Accession #:    [id number] Date of Birth: [birth year]        Patient Gender: M Patient Age:   47 years Exam Location:  [facility] Procedure:      VAS [place] LOWER EXTREMITY VENOUS (DVT) Referring Phys: [facility] GOEL --------------------------------------------------------------------------------  Indications: Swelling, Edema, and SOB. Other Indications: H/O DVT and SVT, throat cancer. Comparison Study: Previous study on 1.4.2022. Performing Technologist: Fernande Bras  Examination Guidelines: A complete evaluation includes B-mode imaging, spectral Doppler, color Doppler, and power Doppler as needed of all accessible portions of each vessel. Bilateral testing is considered an integral part of a complete examination. Limited examinations for reoccurring indications may be performed as noted. The reflux portion of the exam is performed with the patient in reverse Trendelenburg.  +---------+---------------+---------+-----------+----------+--------------+ RIGHT    CompressibilityPhasicitySpontaneityPropertiesThrombus Aging +---------+---------------+---------+-----------+----------+--------------+ CFV      Full           Yes      Yes                                  +---------+---------------+---------+-----------+----------+--------------+ SFJ      Full  Yes      Yes                                 +---------+---------------+---------+-----------+----------+--------------+ FV Prox  Full                                                        +---------+---------------+---------+-----------+----------+--------------+ FV Mid   Full                                                        +---------+---------------+---------+-----------+----------+--------------+ FV DistalFull                                                        +---------+---------------+---------+-----------+----------+--------------+ PFV      Full                                                        +---------+---------------+---------+-----------+----------+--------------+ POP      Full           Yes      Yes                                 +---------+---------------+---------+-----------+----------+--------------+ PTV      Full                                                        +---------+---------------+---------+-----------+----------+--------------+ PERO     Full                                                        +---------+---------------+---------+-----------+----------+--------------+   +---------+---------------+---------+-----------+----------+--------------+ LEFT     CompressibilityPhasicitySpontaneityPropertiesThrombus Aging +---------+---------------+---------+-----------+----------+--------------+ CFV      Full           Yes      Yes                                 +---------+---------------+---------+-----------+----------+--------------+ SFJ      Full           Yes      Yes                                 +---------+---------------+---------+-----------+----------+--------------+ FV Prox  Full                                                         +---------+---------------+---------+-----------+----------+--------------+  FV Mid   Full                                                        +---------+---------------+---------+-----------+----------+--------------+ FV DistalFull                                                        +---------+---------------+---------+-----------+----------+--------------+ PFV      Full                                                        +---------+---------------+---------+-----------+----------+--------------+ POP      Full           Yes      Yes                                 +---------+---------------+---------+-----------+----------+--------------+ PTV      Full                                                        +---------+---------------+---------+-----------+----------+--------------+ PERO     Full                                                        +---------+---------------+---------+-----------+----------+--------------+ GSV      Partial        No       No                                  +---------+---------------+---------+-----------+----------+--------------+ A small segment of the GSV at the middle calf level is thrombosed and chronic.    Summary: RIGHT: - There is no evidence of deep vein thrombosis in the lower extremity.  - No cystic structure found in the popliteal fossa.   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  - There is no evidence of deep vein thrombosis in the lower extremity.  - No cystic structure found in the popliteal fossa.   *See table(s) above for measurements and observations. Electronically signed by Irvin Mantel on 11/17/2023 at 8:04:36 PM.    Final    DG Chest 1 View Result Date: 11/17/2023 CLINICAL DATA:  [id number] S/P thoracentesis [id number] EXAM: PORTABLE CHEST 1 VIEW COMPARISON:  Chest XR, 11/15/2023.  CT chest, 08/13/2023. FINDINGS: Support lines: Spinal  cord stimulator, with leads extending outside field of view. Cardiomediastinal silhouette is within normal limits. The LEFT lung is well inflated. Improved aeration of the RIGHT lung post thoracentesis with small volume residual pleural effusion. RIGHT  perihilar pulmonary opacity with trace residual fluid layering along the minor fissure. No pneumothorax. RIGHT AC joint degenerative change. No acute osseous abnormality. IMPRESSION: Improved aeration of the RIGHT lung post thoracentesis with small volume residual pleural effusion. No pneumothorax. Electronically Signed   By: Roanna Banning M.D.   On: 11/17/2023 16:54   US THORACENTESIS ASP PLEURAL SPACE W/IMG GUIDE Result Date: 11/17/2023 INDICATION: Patient with history of non-small cell lung cancer of right lung, recurrent pleural effusion. IR consulted for diagnostic and therapeutic right thoracentesis. EXAM: ULTRASOUND GUIDED DIAGNOSTIC AND THERAPEUTIC RIGHT THORACENTESIS MEDICATIONS: 10 mL 1% lidocaine COMPLICATIONS: None immediate. PROCEDURE: An ultrasound guided thoracentesis was thoroughly discussed with the patient and questions answered. The benefits, risks, alternatives and complications were also discussed. The patient understands and wishes to proceed with the procedure. Written consent was obtained. Ultrasound was performed to localize and mark an adequate pocket of fluid in the right chest. The area was then prepped and draped in the normal sterile fashion. 1% Lidocaine was used for local anesthesia. Under ultrasound guidance a 6 Fr Safe-T-Centesis catheter was introduced. Thoracentesis was performed. The catheter was removed and a dressing applied. FINDINGS: A total of approximately 820 mL of yellow fluid was removed. Samples were sent to the laboratory as requested by the clinical team. IMPRESSION: Successful ultrasound guided diagnostic and therapeutic RIGHT thoracentesis yielding 820 mL of pleural fluid. Performed by: Vladimir Crofts, PA-C  Electronically Signed   By: Roanna Banning M.D.   On: 11/17/2023 16:41   DG Chest 1 View Result Date: 11/16/2023 CLINICAL DATA:  confusion, cancer history EXAM: CHEST  1 VIEW COMPARISON:  08/27/2023 FINDINGS: Interval development of moderate right pleural effusion, with atelectasis/consolidation the right lung base. There is some linear right suprahilar scarring or atelectasis. Left lung clear. Heart size and mediastinal contours are within normal limits. Small caliber catheter placement through the left neck, chest, and upper abdomen. Visualized bones unremarkable. IMPRESSION: Moderate right pleural effusion with right basilar atelectasis/consolidation. Electronically Signed   By: Corlis Leak M.D.   On: 11/16/2023 17:56   CT HEAD WO CONTRAST Result Date: 11/16/2023 CLINICAL DATA:  Mental status change of unknown cause. Head and neck cancer being treated with radiation. EXAM: CT HEAD WITHOUT CONTRAST TECHNIQUE: Contiguous axial images were obtained from the base of the skull through the vertex without intravenous contrast. RADIATION DOSE REDUCTION: This exam was performed according to the departmental dose-optimization program which includes automated exposure control, adjustment of the mA and/or kV according to patient size and/or use of iterative reconstruction technique. COMPARISON:  10/15/2022 FINDINGS: Brain: The brain shows a normal appearance without evidence of malformation, atrophy, old or acute small or large vessel infarction, mass lesion, hemorrhage, hydrocephalus or extra-axial collection. Vascular: No hyperdense vessel. No evidence of atherosclerotic calcification. Skull: Normal.  No traumatic finding.  No focal bone lesion. Sinuses/Orbits: Sinuses are clear. Orbits appear normal. Mastoids are clear. Other: Upper cervical neurostimulator in place. IMPRESSION: Normal head CT. Upper cervical neurostimulator in place. Electronically Signed   By: Paulina Fusi M.D.   On: 11/16/2023 16:21    Scheduled  Meds:  budeson-glycopyrrolate-formoterol  2 puff Inhalation BID   Chlorhexidine Gluconate Cloth  6 each Topical Daily   enoxaparin (LOVENOX) injection  40 mg Subcutaneous Q24H   levothyroxine  100 mcg Oral QAC breakfast   mouth rinse  15 mL Mouth Rinse 4 times per day   sodium chloride flush  3 mL Intravenous Q12H   Continuous Infusions:  azithromycin Stopped (11/17/23 1534)  cefTRIAXone (ROCEPHIN)  IV Stopped (11/18/23 0957)     LOS: 2 days    Time spent: 39 min   Barbee Lew, MD  11/18/2023, 2:04 PM

## 2023-11-18 NOTE — Plan of Care (Signed)
   Problem: Clinical Measurements: Goal: Will remain free from infection Outcome: Progressing Goal: Diagnostic test results will improve Outcome: Progressing   Problem: Nutrition: Goal: Adequate nutrition will be maintained Outcome: Progressing

## 2023-11-18 NOTE — Progress Notes (Signed)
   11/18/23 1425  Spiritual Encounters  Type of Visit Initial  Care provided to: Pt and family  Referral source Physician  Spiritual Framework  Presenting Themes Goals in life/care   Per referral by Dr. Alejos Husband, I sought to offer spiritual care support to Mr. Lyell Samuel and his wife, Thersia Flax.  Acknowledging that they had just had a goals of care meeting, I asked how they felt and invited them to debrief if desired. Mr. Wichmann expressed that felt prepared for that conversation and satisfied with feeling informed. Both seemed to be in a place of acceptance and acknowledged belief in a higher power, this informs and supports their experience and meaning making at this time.  I offered to return for further opportunities to debrief with a chaplain independently of each other. I encouraged Mrs. Hipp in her self-care and mentioned the garden at [facility] She stated she is getting out with their beagle and that is a positive. I also inquired about HCPOA needs and they informed me that they feel well prepared.  I let them know how to request a chaplain at anytime through their care team.  Paul Torpey L. Minetta Aly, M.Div [phone]

## 2023-11-18 NOTE — Progress Notes (Signed)
   11/18/23 1315  Spiritual Encounters  Type of Visit Attempt (pt unavailable)   Per consult request by Dr. Alejos Husband, I attempted to offer support to Mr. Lyell Samuel and his wife. At time I arrived on unit, I could see they were having lunch. I will plan to return later this afternoon.  Neeya Prigmore L. Minetta Aly, M.Div [phone]

## 2023-11-18 NOTE — Consult Note (Signed)
 Consultation Note Date: 11/18/2023   Patient Name: Brian [name]  DOB: 08-13-1960  MRN: [id number]  Age / Sex: 63 y.o., male  PCP: [name], MD Referring Physician: Barbee Lew, MD  Reason for Consultation: Establishing goals of care  HPI/Patient Profile: 63 y.o. male admitted on 11/16/2023.   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 Palliative care consulted for goals of care discussions. Patient follows with Dr. Liam Redhead from the cancer center. Patient is seen and wife was present at bedside. Palliative medicine is specialized medical care for people living with serious illness. It focuses on providing relief from the symptoms and stress of a serious illness. The goal is to improve quality of life for both the patient and the family. Goals of care: Broad aims of medical therapy in relation to the patient's values and preferences. Our aim is to provide medical care aimed at enabling patients to achieve the goals that matter most to them, given the circumstances of their particular medical situation and their constraints.    NEXT OF KIN Lives at home with wife  SUMMARY OF RECOMMENDATIONS   Goals of care discussions undertaken with the patient and wife was present at bedside.  Reviewed about patient's current condition, underlying serious illness of lung cancer also discussed.  Recommendations from medical oncology standpoint also discussed to the best of my ability.  Explained about  CODE STATUS discussions full code versus DNR/DNI.  Patient states that this has not been brought up in the past.  We reviewed as to what happens inside the full scope of her resuscitative attempt and compared and contrasted it to DNR/DNI.  Additionally, we discussed about having the chaplain consult for been given advance directives packet and for designation of healthcare power of attorney for the patient's wife.  Remains full code for now.  Recommend addition of palliative services at [facility] cancer Center.  Thank you for the consult. Code Status/Advance Care Planning: Full code   Symptom Management:     Palliative Prophylaxis:  Frequent Pain Assessment  Additional Recommendations (Limitations, Scope, Preferences): Full Scope Treatment  Psycho-social/Spiritual:  Desire for further Chaplaincy support:yes Additional Recommendations: Caregiving  Support/Resources  Prognosis:  Unable to determine  Discharge Planning: To Be Determined      Primary Diagnoses: Present on Admission:  Pneumonia   I have reviewed the medical record, interviewed the patient and family, and examined the patient. The following aspects are pertinent.  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    Non-small cell cancer of right lung (HCC) 08/23/2018   Pneumonia    Pre-diabetes    Radiation-induced brachial plexopathy    Recurrent right pleural effusion    Secondary malignant neoplasm of brain and spinal cord (HCC)    Subarachnoid hemorrhage (HCC) 2016   Voice hoarseness    Social History   Socioeconomic History   Marital status: Married    Spouse name: Olegario Messier    Number of children: 2   Years of education: Not on file   Highest education level: Not on file  Occupational History   Not on file  Tobacco Use   Smoking status: Former    Types: Cigarettes   Smokeless tobacco: Never  Vaping Use   Vaping status: Never Used  Substance and Sexual Activity   Alcohol use: Yes    Alcohol/week: 5.0 standard drinks of alcohol    Types: 5 Standard drinks or equivalent per week    Comment: occasionally   Drug use: No   Sexual activity: Not on file  Other Topics Concern   Not on file  Social History Narrative   Are you right handed or left handed? Right   Are you currently employed ?    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   Who lives with you? wife   What type of home do you live in: 1 story or 2 story? Two    Caffeine  3 cups a day    Social Drivers of Corporate investment banker Strain: Not on file  Food Insecurity: No Food Insecurity (11/18/2023)   Hunger Vital Sign    Worried About Running Out of Food in the Last Year: Never true    Ran Out of Food in the Last Year: Never true  Transportation Needs: No Transportation Needs (11/18/2023)   PRAPARE - Administrator, Civil Service (Medical): No    Lack of Transportation (Non-Medical): No  Physical Activity: Not on file  Stress: Not on file  Social Connections: Not on file   Family History  Problem Relation Age of Onset   Diabetes Mother    Diabetes Brother    Colon cancer Neg Hx    Stomach cancer Neg Hx    Thyroid cancer Neg Hx    Scheduled Meds:  budeson-glycopyrrolate-formoterol  2 puff Inhalation BID   Chlorhexidine Gluconate Cloth  6 each Topical Daily   enoxaparin (LOVENOX) injection  40 mg Subcutaneous Q24H   levothyroxine  100 mcg Oral QAC breakfast   mouth rinse  15 mL Mouth Rinse 4 times per day   sodium chloride flush  3 mL Intravenous Q12H   Continuous Infusions:  azithromycin Stopped (11/17/23 1534)   cefTRIAXone (ROCEPHIN)  IV  Stopped (11/18/23 0957)   PRN Meds:.acetaminophen **OR** acetaminophen, albuterol, mouth rinse, polyethylene glycol Medications Prior to Admission:  Prior to Admission medications   Medication Sig Start Date End Date Taking? Authorizing Provider  acetaminophen (TYLENOL) 500 MG tablet Take 1,000 mg by mouth every 6 (six) hours as needed (for pain).   Yes [provider]  albuterol (VENTOLIN HFA) 108 (90 Base) MCG/ACT inhaler INHALE 2 PUFFS INTO THE LUNGS EVERY 4 (FOUR) HOURS AS NEEDED FOR WHEEZING OR SHORTNESS OF BREATH. 11/07/20  Yes Coral Ceo, NP  amLODipine (NORVASC) 5 MG tablet Take 5 mg by mouth in the morning. 06/10/21  Yes [provider]  atorvastatin (LIPITOR) 20 MG tablet Take 20 mg by mouth in the morning.   Yes [provider]  baclofen (LIORESAL) 10 MG tablet Take 10 mg by mouth at bedtime. 09/26/23  Yes [provider]  cyclobenzaprine (FLEXERIL) 5 MG tablet Take 1 tablet (5 mg total) by mouth 3 (three) times daily as needed for muscle spasms. 10/18/23  Yes Joan Flores, PA-C  DULoxetine (CYMBALTA) 60 MG capsule Take 1 capsule (60 mg total) by mouth daily. 03/17/23  Yes Antony Madura, MD  levothyroxine (SYNTHROID) 100 MCG tablet Take 100 mcg by mouth daily before breakfast.   Yes [provider]  LORazepam (ATIVAN) 0.5 MG tablet 1 tab po 30 minutes prior to radiation appointments Patient taking differently: Take 0.5 mg by mouth See admin instructions. Take 0.5 mg by mouth 30 minutes prior to radiation appointments 10/26/23  Yes Ronny Bacon, PA-C  lorlatinib [facility]) 100 MG tablet Take 1 tablet (100 mg total) by mouth daily. Swallow tablets whole. Do not chew, crush or split tablets. 10/14/23  Yes Si Gaul, MD  [facility] 17 GM/SCOOP powder Take 17 g by mouth daily as needed for mild constipation.   Yes [provider]  mupirocin ointment (BACTROBAN) 2 % Apply 1 Application topically See admin instructions. Apply to  affected incision sites after a shower- morning and bedtime   Yes [provider]  ondansetron (ZOFRAN) 8 MG tablet Take 1 tablet (8 mg total) by mouth every 8 (eight) hours as needed for nausea or vomiting. Patient taking differently: Take 8 mg by mouth See admin instructions. Take 8 mg by mouth in the morning and an additional 8 mg up to two times a day as needed for nausea and/or vomiting 11/03/23  Yes Heilingoetter, Cassandra L, PA-C  prochlorperazine (COMPAZINE) 10 MG tablet Take 1 tablet (10 mg total) by mouth every 6 (six) hours as needed. Patient taking differently: Take 10 mg by mouth See admin instructions. Take 10 mg by mouth in the morning and an additional 10 mg up to three times a day as needed for nausea and/or vomiting 11/03/23  Yes Heilingoetter, Cassandra L, PA-C  TRELEGY ELLIPTA 100-62.5-25 MCG/INH AEPB Inhale 1 puff into the lungs in the morning. 01/09/20  Yes [provider]  venlafaxine (EFFEXOR) 37.5 MG tablet Take 37.5 mg by mouth in the morning. 05/25/23  Yes [provider]  amitriptyline (ELAVIL) 25 MG tablet Take 3 tablets (75 mg total) by mouth at bedtime. Patient not taking: Reported on 11/16/2023 08/30/23 11/16/23  Edward Jolly, MD  mirtazapine (REMERON) 15 MG tablet TAKE 1 TABLET BY MOUTH EVERYDAY AT BEDTIME 11/17/23   Si Gaul, MD  oxyCODONE (ROXICODONE) 5 MG immediate release tablet Take 1 tablet (5 mg total) by mouth every 4 (four) hours as needed for severe pain (pain score 7-10). Patient not taking: Reported on 11/16/2023 10/05/23   Joan Flores, PA-C   No Known Allergies Review of Systems Shortness of breath Physical Exam Sitting up in bed appears chronically ill monitor noted Diminished breath sounds Tachycardia on monitor Abdomen nondistended Awake alert dyspnea prevents him from having full-length conversations however is able to converse and explain his mind and answer questions  Vital Signs: BP (!) 159/84   Pulse 87   Temp  98.5 F (36.9 C) (Oral)   Resp (!) 22   Ht 5\' 7"  (1.702 m)  Wt 70.6 kg   SpO2 (!) 82%   BMI 24.38 kg/m  Pain Scale: 0-10   Pain Score: 0-No pain   SpO2: SpO2: (!) 82 % O2 Device:SpO2: (!) 82 % O2 Flow Rate: .O2 Flow Rate (L/min): 4 L/min  IO: Intake/output summary:  Intake/Output Summary (Last 24 hours) at 11/18/2023 1207 Last data filed at 11/18/2023 1001 Gross per 24 hour  Intake 210.02 ml  Output 1400 ml  Net -1189.98 ml    LBM: Last BM Date :  (PTA) Baseline Weight: Weight: 70.6 kg Most recent weight: Weight: 70.6 kg     Palliative Assessment/Data:   Palliative performance scale 50%  Time In: 11 Time Out: 12 Time Total: 60 Greater than 50%  of this time was spent counseling and coordinating care related to the above assessment and plan.  Signed by: Lujean Sake, MD   Please contact Palliative Medicine Team phone at [phone] for questions and concerns.  For individual provider: See Tilford Foley

## 2023-11-19 ENCOUNTER — Encounter (HOSPITAL_COMMUNITY): Payer: Self-pay | Admitting: Internal Medicine

## 2023-11-19 ENCOUNTER — Inpatient Hospital Stay (HOSPITAL_COMMUNITY)

## 2023-11-19 ENCOUNTER — Ambulatory Visit
Admission: RE | Admit: 2023-11-19 | Discharge: 2023-11-19 | Disposition: A | Discharge status: Home / Self Care | Source: Ambulatory Visit | Attending: Radiation Oncology | Admitting: Radiation Oncology

## 2023-11-19 ENCOUNTER — Other Ambulatory Visit: Payer: Self-pay

## 2023-11-19 DIAGNOSIS — Z7189 Other specified counseling: Secondary | ICD-10-CM | POA: Diagnosis not present

## 2023-11-19 DIAGNOSIS — Z510 Encounter for antineoplastic radiation therapy: Secondary | ICD-10-CM | POA: Diagnosis not present

## 2023-11-19 DIAGNOSIS — C3411 Malignant neoplasm of upper lobe, right bronchus or lung: Secondary | ICD-10-CM | POA: Diagnosis not present

## 2023-11-19 DIAGNOSIS — C778 Secondary and unspecified malignant neoplasm of lymph nodes of multiple regions: Secondary | ICD-10-CM | POA: Diagnosis not present

## 2023-11-19 DIAGNOSIS — Z515 Encounter for palliative care: Secondary | ICD-10-CM | POA: Diagnosis not present

## 2023-11-19 DIAGNOSIS — J189 Pneumonia, unspecified organism: Secondary | ICD-10-CM | POA: Diagnosis not present

## 2023-11-19 LAB — ACID FAST SMEAR (AFB, MYCOBACTERIA): Acid Fast Smear: NEGATIVE

## 2023-11-19 LAB — RAD ONC ARIA SESSION SUMMARY
Course Elapsed Days: 15
Plan Fractions Treated to Date: 10
Plan Prescribed Dose Per Fraction: 3 Gy
Plan Total Fractions Prescribed: 10
Plan Total Prescribed Dose: 30 Gy
Reference Point Dosage Given to Date: 30 Gy
Reference Point Session Dosage Given: 3 Gy
Session Number: 10

## 2023-11-19 LAB — BASIC METABOLIC PANEL WITH GFR
Anion gap: 11 (ref 5–15)
BUN: 15 mg/dL (ref 6–20)
CO2: 26 mmol/L (ref 22–32)
Calcium: 8.3 mg/dL — ABNORMAL LOW (ref 8.9–10.3)
Chloride: 99 mmol/L (ref 98–111)
Creatinine, Ser: 0.36 mg/dL — ABNORMAL LOW (ref 0.61–1.24)
GFR, Estimated: >60 mL/min (ref >60)
Glucose, Bld: 109 mg/dL — ABNORMAL HIGH (ref 70–99)
Potassium: 3.3 mmol/L — ABNORMAL LOW (ref 3.5–5.1)
Sodium: 136 mmol/L (ref 135–145)

## 2023-11-19 LAB — BLOOD GAS, ARTERIAL
Acid-Base Excess: 6 mmol/L — ABNORMAL HIGH (ref 0.0–2.0)
Bicarbonate: 32.2 mmol/L — ABNORMAL HIGH (ref 20.0–28.0)
Drawn by: 23532
O2 Content: 4 L/min
O2 Saturation: 96.3 %
Patient temperature: 37
pCO2 arterial: 52 mmHg — ABNORMAL HIGH (ref 32–48)
pH, Arterial: 7.4 (ref 7.35–7.45)
pO2, Arterial: 64 mmHg — ABNORMAL LOW (ref 83–108)

## 2023-11-19 LAB — CBC
HCT: 28.8 % — ABNORMAL LOW (ref 39.0–52.0)
Hemoglobin: 8.9 g/dL — ABNORMAL LOW (ref 13.0–17.0)
MCH: 29.5 pg (ref 26.0–34.0)
MCHC: 30.9 g/dL (ref 30.0–36.0)
MCV: 95.4 fL (ref 80.0–100.0)
Platelets: 382 10*3/uL (ref 150–400)
RBC: 3.02 MIL/uL — ABNORMAL LOW (ref 4.22–5.81)
RDW: 19.5 % — ABNORMAL HIGH (ref 11.5–15.5)
WBC: 16.7 10*3/uL — ABNORMAL HIGH (ref 4.0–10.5)
nRBC: 0 % (ref 0.0–0.2)

## 2023-11-19 MED ORDER — POTASSIUM CHLORIDE CRYS ER 20 MEQ PO TBCR
40.0000 meq | EXTENDED_RELEASE_TABLET | Freq: Once | ORAL | Status: AC
  Administered 2023-11-19: 40 meq via ORAL
  Filled 2023-11-19: qty 2

## 2023-11-19 MED ORDER — GADOBUTROL 1 MMOL/ML IV SOLN: 7.0000 mL | Freq: Once | INTRAVENOUS | Status: DC | PRN

## 2023-11-19 MED ORDER — ENSURE ENLIVE PO LIQD
237.0000 mL | Freq: Two times a day (BID) | ORAL | Status: DC
Start: 2023-11-20 — End: 2023-11-23
  Administered 2023-11-22 (×2): 237 mL via ORAL

## 2023-11-19 MED ORDER — HALOPERIDOL LACTATE 5 MG/ML IJ SOLN
2.0000 mg | Freq: Once | INTRAMUSCULAR | Status: AC
  Administered 2023-11-19: 2 mg via INTRAVENOUS
  Filled 2023-11-19: qty 1

## 2023-11-19 MED ORDER — HALOPERIDOL LACTATE 5 MG/ML IJ SOLN
2.0000 mg | Freq: Four times a day (QID) | INTRAMUSCULAR | Status: DC | PRN
  Administered 2023-11-20 (×3): 2 mg via INTRAVENOUS
  Filled 2023-11-19 (×3): qty 1

## 2023-11-19 NOTE — Progress Notes (Signed)
 At shift report, Pt unable to state his name and is continuously trying to get out of bed. MD notified for safety observation order. Attendant at bedside for safety. Plan of care ongoing.

## 2023-11-19 NOTE — Progress Notes (Addendum)
    Patient Name: Brian [name]           DOB: 1960/10/01  MRN: [id number]      Admission Date: 11/16/2023  Attending Provider: Will Almarie KANDICE, MD  Primary Diagnosis: Pneumonia   Level of care: Progressive    CROSS COVER NOTE   Date of Service   11/19/2023   Lamar KANDICE Banter, 63 y.o. male, was admitted on 11/16/2023 for Pneumonia.    HPI/Events of Note   Agitated, restless, and confused Acute metabolic toxic encephalopathy -multifactorial; secondary to acute hypoxic respiratory failure Per RN, pt has been intermittently confused during day time. Received IV ativan  for MRI and became increasingly confused since then.  Restless in bed, oriented to self only. Unfortunately, he is not following commands despite multiple attempts at redirection by staff.  Becoming more tachycardic and tachypneic, likely from restlessness. Currently on 3L [place], with optimal spo2. Adding IV haldol  for agitation.  Adding ABG given hx of COPD.    Bedside Assessment:  Patient is awake, alert to self. Restless in bed.  Sitter at bedside  Respiratory: Bilaterally diminished, no wheezing, no crackles. Normal effort. No accessory muscle use.  Cardiovascular: Regular rhythm. Tachycardic. Trace edema. Abdomen: Abdomen is soft and nontender.  Positive bowel sounds in all quadrants.   Addendum: Blood gas compensated, holding off on BiPAP at this time.  Haldol  dose has improved his behavior.    Interventions/ Plan   Will trial IV haldol  for agitation.  Sitter ABG        Lavanda Horns, DNP, ACNPC- AG [facility] [place]

## 2023-11-19 NOTE — Progress Notes (Signed)
 On-call provider notified that patient had went down for a MRI and RN had given patient IV ativan  prior to MRI and it did not help because patient could not keep still. About 15 minutes ago patient kept trying to get out of the bed and was confused. Patient could not even state his own name and still loopy from the Ativan . Prior to PRN IV ativan , patient was alert and oriented with intermittent confusion. Order was put in for a Recruitment consultant. Night shift RN aware and will continue to monitor for any changes.

## 2023-11-19 NOTE — Progress Notes (Signed)
   11/19/23 1125  Spiritual Encounters  Type of Visit Follow up  Care provided to: Patient  Referral source Chaplain assessment   While rounding on unit I offered support to [name].  [name] shared that he was waiting for a final radiation treatment. He debriefed with me experiences of recent care and hopes for getting back home. He shared about some family details and his children. He named his wife as a significant supporter and wise advocate.  I offered compassionate presence and aimed to build relationship. I offered normalization of feelings around some of the frustrations of receiving care and hospital stays. I also validated concerns for behavior that patient cited as subpar and offered to provide follow up support through Patient Experience.  Spiritual care remains available as needed.  Vanetta Rule L. Minetta Aly, M.Div [phone]

## 2023-11-19 NOTE — Evaluation (Addendum)
 Physical Therapy Evaluation Patient Details Name: Brian [name] MRN: [id number] DOB: [birth year] Today's Date: 11/19/2023  History of Present Illness  63 year old gentleman admitted 11/16/23 with acute hypoxic respiratory failure secondary to community-acquired pneumonia, right-sided pleural effusion, S/P thoracentesis. PMH.:Lung cancer, brain and [place] mets, HTN, SAH, DVT, IVCfilter, radiation induced RUE plexopathy, Pleaural effusion  Clinical Impression  Pt admitted with above diagnosis.  Pt currently with functional limitations due to the deficits listed below (see PT Problem List). Pt will benefit from acute skilled PT to increase their independence and safety with mobility to allow discharge.     The patient reports feeling drowsy. Wife present.  Patient able to mobilize to sitting  with supervision. Mood assist to stand and step to recliner with HHA.  Patient presents with  RUE  motor and  sensory deficits, Present PTA.  Left hand  also with decreased sensation and fine motor  but can use Ipad. Patient reports  resides with wife, has been driving and working recently. Wife left  so info not confirmed.  Patient has been able to negotiate  12 steps to B/B.  Patient will benefit from PT to maximize safe mobility to return home.      If plan is discharge home, recommend the following: A little help with walking and/or transfers;Help with stairs or ramp for entrance;Direct supervision/assist for medications management;Assistance with cooking/housework;A little help with bathing/dressing/bathroom;Assistance with feeding   Can travel by private vehicle        Equipment Recommendations None recommended by PT  Recommendations for Other Services       Functional Status Assessment Patient has had a recent decline in their functional status and demonstrates the ability to make significant improvements in function in a reasonable and predictable amount of time.     Precautions / Restrictions  Precautions Precautions: Fall Precaution/Restrictions Comments: RUE plegic/flaccid Required Braces or Orthoses: Sling;Other Brace Other Brace: pt reports has several slings, has gloves for edema control Restrictions Weight Bearing Restrictions Per Provider Order: No      Mobility  Bed Mobility Overal bed mobility: Needs Assistance Bed Mobility: Supine to Sit     Supine to sit: Supervision, HOB elevated     General bed mobility comments: no assistance    Transfers Overall transfer level: Needs assistance Equipment used: 1 person hand held assist Transfers: Sit to/from Stand, Bed to chair/wheelchair/BSC Sit to Stand: Min assist           General transfer comment: extra effort to power up  first time, Able to step to recliner, Practiced sit to stand x 3 more times with CGA    Ambulation/Gait                  Stairs            Wheelchair Mobility     Tilt Bed    Modified Rankin (Stroke Patients Only)       Balance Overall balance assessment: Needs assistance Sitting-balance support: No upper extremity supported, Feet supported Sitting balance-Leahy Scale: Fair     Standing balance support: During functional activity, Single extremity supported Standing balance-Leahy Scale: Poor                               Pertinent Vitals/Pain Pain Assessment Pain Assessment: No/denies pain    Home Living Family/patient expects to be discharged to:: Private residence Living Arrangements: Spouse/significant other Available Help at Discharge: Available 24 hours/day;Family  Type of Home: House Home Access: Stairs to enter   [employer] of Steps: 2 +3  holds door Alternate Level Stairs-Number of Steps: 6+6 Home Layout: Two level Home Equipment: None      Prior Function Prior Level of Function : Independent/Modified Independent;Working/employed;Driving             Mobility Comments: per patient has  been driving and  working ADLs Comments: assisted by wife, wife assists with feeding     Extremity/Trunk Assessment   Upper Extremity Assessment Upper Extremity Assessment: Right hand dominant;LUE deficits/detail RUE Deficits / Details: flaccid, subluxed shoulder, LUE Deficits / Details: limited finger movement, can use thumb to  type on ipad    Lower Extremity Assessment Lower Extremity Assessment: Generalized weakness    Cervical / Trunk Assessment Cervical / Trunk Assessment: Kyphotic  Communication   Communication Communication: No apparent difficulties    Cognition Arousal: Alert Behavior During Therapy: WFL for tasks assessed/performed   PT - Cognitive impairments: No apparent impairments                       PT - Cognition Comments: wife not present, patient reports recently working and driving, able to explain his work Following commands: Designer, television/film set Comments      Exercises     Assessment/Plan    PT Assessment Patient needs continued PT services  PT Problem List Decreased strength;Decreased mobility;Decreased safety awareness;Impaired tone;Decreased range of motion;Decreased knowledge of precautions;Decreased coordination;Decreased activity tolerance;Decreased balance;Decreased knowledge of use of DME;Impaired sensation       PT Treatment Interventions DME instruction;Gait training;Therapeutic exercise;Balance training;Stair training;Functional mobility training;Therapeutic activities;Patient/family education    PT Goals (Current goals can be found in the Care Plan section)  Acute Rehab PT Goals Patient Stated Goal: go home PT Goal Formulation: With patient/family Time For Goal Achievement: 12/03/23 Potential to Achieve Goals: Good    Frequency Min 3X/week     Co-evaluation               AM-PAC PT "6 Clicks" Mobility  Outcome Measure Help needed turning from your back to your side while in a flat bed without using  bedrails?: None Help needed moving from lying on your back to sitting on the side of a flat bed without using bedrails?: None Help needed moving to and from a bed to a chair (including a wheelchair)?: A Little Help needed standing up from a chair using your arms (e.g., wheelchair or bedside chair)?: A Lot Help needed to walk in hospital room?: Total Help needed climbing 3-5 steps with a railing? : Total 6 Click Score: 15    End of Session Equipment Utilized During Treatment: Gait belt Activity Tolerance: Patient tolerated treatment well Patient left: in chair;with call bell/phone within reach;with chair alarm set Nurse Communication: Mobility status PT Visit Diagnosis: Unsteadiness on feet (R26.81);Muscle weakness (generalized) (M62.81);Other symptoms and signs involving the nervous system (R29.898);Difficulty in walking, not elsewhere classified (R26.2);Hemiplegia and hemiparesis Hemiplegia - Right/Left: Right Hemiplegia - dominant/non-dominant: Dominant Hemiplegia - caused by: Nontraumatic SAH    Time: 5284-1324 PT Time Calculation (min) (ACUTE ONLY): 24 min   Charges:   PT Evaluation $PT Eval Low Complexity: 1 Low PT Treatments $Therapeutic Activity: 8-22 mins PT General Charges $$ ACUTE PT VISIT: 1 Visit         Abelina Hoes PT Acute Rehabilitation Services Office [phone] Weekend pager-[phone]   [place],  Levorn Reason 11/19/2023, 10:53 AM

## 2023-11-19 NOTE — Progress Notes (Signed)
 Daily Progress Note   Patient Name: Brian [name]       Date: 11/19/2023 DOB: 1960/09/15  Age: 63 y.o. MRN#: [id number] Attending Physician: Will Almarie KANDICE, MD Primary Care Physician: [name], MD Admit Date: 11/16/2023  Reason for Consultation/Follow-up: Establishing goals of care  Subjective: Awake alert, no distress, sitting in chair, feels better than yesterday, awaiting MRI brain  Length of Stay: 3  Current Medications: Scheduled Meds:  . budeson-glycopyrrolate -formoterol   2 puff Inhalation BID  . Chlorhexidine  Gluconate Cloth  6 each Topical Daily  . enoxaparin  (LOVENOX ) injection  40 mg Subcutaneous Q24H  . levothyroxine   100 mcg Oral QAC breakfast  . mouth rinse  15 mL Mouth Rinse 4 times per day  . sodium chloride  flush  3 mL Intravenous Q12H    Continuous Infusions: . azithromycin  Stopped (11/18/23 1743)  . cefTRIAXone  (ROCEPHIN )  IV Stopped (11/19/23 1010)    PRN Meds: acetaminophen  **OR** acetaminophen , albuterol , LORazepam , mouth rinse, polyethylene glycol  Physical Exam         Awake alert No distress Monitor noted  Vital Signs: BP 122/87   Pulse (!) 104   Temp 97.9 F (36.6 C) (Oral)   Resp (!) 25   Ht 5' 7 (1.702 m)   Wt 70.6 kg   SpO2 100%   BMI 24.38 kg/m  SpO2: SpO2: 100 % O2 Device: O2 Device: High Flow Nasal Cannula O2 Flow Rate: O2 Flow Rate (L/min): (S) 4 L/min (Weaned to 3 L HFNC.)  Intake/output summary:  Intake/Output Summary (Last 24 hours) at 11/19/2023 1211 Last data filed at 11/19/2023 1100 Gross per 24 hour  Intake 360.04 ml  Output 250 ml  Net 110.04 ml   LBM: Last BM Date :  (PTA) Baseline Weight: Weight: 70.6 kg Most recent weight: Weight: 70.6 kg       Palliative Assessment/Data:      Patient  Active Problem List   Diagnosis Date Noted  . Pneumonia 11/16/2023  . AMS (altered mental status) 11/16/2023  . Complex regional pain syndrome type 1 of left lower extremity 07/20/2023  . 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 Chronic pain syndrome 07/20/2023  . Complex regional pain syndrome type 1 of right upper extremity 05/04/2023  . Right arm pain 05/04/2023  . 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  .  Recurrent right pleural effusion 07/23/2021  . Hypothyroidism 10/24/2020  . Acute hypoxemic respiratory failure (HCC) 05/19/2020  . Acute respiratory failure with hypoxia (HCC) 05/18/2020  . Essential hypertension 05/18/2020  . Type 2 diabetes mellitus with hyperlipidemia (HCC) 05/18/2020  . S/P thoracentesis 03/21/2020  . 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/05/2020  . Calf swelling 02/21/2020  . Rash 02/07/2020  . Adenocarcinoma of right lung, stage 4 (HCC) 02/07/2020  . Chronic cough 12/20/2019  . Venous thrombosis 09/21/2019  . Shortness of breath 09/07/2019  . Headache 08/09/2019  . Lobar pneumonia, unspecified organism (HCC) 03/07/2019  . Centrilobular emphysema (HCC) 03/07/2019  . Encounter for antineoplastic immunotherapy 11/10/2018  . Adenocarcinoma of right lung, stage 3 (HCC) 08/25/2018  . Goals of care, counseling/discussion 08/25/2018  . Encounter for antineoplastic chemotherapy 08/25/2018  . Mass of upper lobe of right lung 08/04/2018  . Subarachnoid hemorrhage (HCC) 05/22/2015    Palliative Care Assessment & Plan   Patient Profile:  63 year old gentleman who lives at home with his wife, chronic right upper extremity palsy, history of metastatic disease in the brain, life limiting illness of non-small cell lung cancer metastatic, admitted with acute hypoxic respiratory failure secondary to community-acquired pneumonia, right-sided pleural effusion for which patient underwent thoracentesis and 850 cc of fluid was removed from her  right lung. Palliative care consulted for goals of care discussions.  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  With right pleural effusion  acute metabolic toxic encephalopathy -likely multifactorial secondary to #1 and side effect from his chemo [name] .   Recommendations/Plan:  MRI brain today.  Brief life review, code status and GOC discussions undertaken: full code full scope care, patient wishes to continue all offered anti cancer treatments, he recalls his first round of cancer treatments and that he tolerated them quite well. He likes to play golf.   Goals of Care and Additional Recommendations: Limitations on Scope of Treatment: Full Scope Treatment  Code Status:    Code Status Orders  (From admission, onward)           Start     Ordered   11/16/23 1742  Full code  Continuous       Question:  By:  Answer:  Other   11/16/23 1743           Code Status History     Date Active Date Inactive Code Status Order ID Comments User Context   05/18/2020 1425 05/20/2020 2214 DNR [id number]  Kriste Emeline BRAVO, MD ED   05/28/2015 1505 05/29/2015 1337 Full Code [id number]  Lanis Pupa, MD Inpatient   05/22/2015 0104 05/28/2015 1505 Full Code [id number]  Lanis Pupa, MD ED       Prognosis:  Unable to determine  Discharge Planning: To Be Determined  Care plan was discussed with patient.   Thank you for allowing the Palliative Medicine Team to assist in the care of this patient. Mod MDM.      Greater than 50%  of this time was spent counseling and coordinating care related to the above assessment and plan.  Lonia Serve, MD  Please contact Palliative Medicine Team phone at [phone] for questions and concerns.

## 2023-11-19 NOTE — Progress Notes (Signed)
 PROGRESS NOTE    Brian [name]  ZOX:[id number] DOB: 08-Jun-1961 DOA: 11/16/2023 PCP: [name], MD   Brief Narrative:63 y.o. male with medical history significant of medical issues as listed below. Patient has chronic right upper extremity palsy.  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 See onc history below. Furhter patient has had frequent right chest thoracentesis to drain fluid of the chest in the past.   [name] seems to have been in usual state of health till approximately 3 or 4 days ago when the patient started having increasing shortness of breath which is principally during periods of exertion such as walking.  It was associated with some cough and congestion/expectoration.  Patient also had increased shortness of breath sensation at night and would occasionally wake up with same.  Patient noticed his SpO2 at home was 87% on room air.  Prompting him to come to the ER today.   Patient reports some lower extremity swelling bilaterally.  However no chest pain no fever.    Another concern for the patient has been increased confusion at home.  For example wife reports patient rush patient rushing downstairs today and saying that his forearm had people inside that needed to be taken out.  There has been similar confusion noted here in the ER tonight.  Patient has gotten out of his stretcher and check the back of it to see if the people there are gone.   Otherwise people patient has been noted to be hypoxic slightly in the ER stabilized on 2 L/min of supplementary oxygen .  Rest of the ER course has been unremarkable.  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  With right pleural effusion He is status post thoracentesis 850 cc of fluid removed from his right . Oxygen  requirement improving now on 4 L  Continue Rocephin  and azithromycin . Bipap at bedtime and prn   ABG 7.28/67/71  #2 acute metabolic toxic encephalopathy -likely multifactorial secondary to #1 and side effect from his chemo [name] .  #3 chronic anemia hemoglobin stable  #4 chronic atrophy of the right more than left upper extremity with weakness  #5 adenocarcinoma with metastasis lung cancer oncology following Last xrt 4/17 Mri brain  Estimated body mass index is 24.38 kg/m as calculated from the following:   Height as of this encounter: 5\' 7"  (1.702 m).   Weight as of this encounter: 70.6 kg.  DVT prophylaxis: lovenox  Code Status: full Family Communication:wife at bedside  Disposition Plan:  Status is: Inpatient Remains inpatient appropriate because: acute illness   Consultants: onc ir   Procedures: thora 4/16 right  Antimicrobials: rocephin  azithro  Subjective:  Eating breakfast  Objective: Vitals:   11/19/23 0900 11/19/23 1000 11/19/23 1100 11/19/23 1214  BP: 122/87     Pulse: 100 (!) 104 (!) 104   Resp: (!) 23 19 (!) 25   Temp:    98.4 F (36.9 C)  TempSrc:    Oral  SpO2: 96% 100% 100%   Weight:      Height:        Intake/Output Summary (Last 24 hours) at 11/19/2023 1242 Last data filed at 11/19/2023 1100 Gross per 24 hour  Intake 360.04 ml  Output 250 ml  Net 110.04 ml   Filed Weights   11/17/23 0227  Weight: 70.6 kg    Examination:  General exam: Appears chronically ill Respiratory system: diminished bs  Cardiovascular system: tachy Gastrointestinal system: Abdomen is nondistended, soft and nontender. No organomegaly or masses felt. Normal bowel sounds heard. Central nervous system: awake alert Extremities: trace edema    Data Reviewed: I have personally reviewed following labs and imaging studies  CBC: Recent Labs  Lab 11/16/23 1547 11/17/23 0313 11/18/23 0305 11/19/23 0306  WBC 16.9* 19.5* 19.4* 16.7*  NEUTROABS 12.3*  --   --   --   HGB 9.2* 9.7* 9.8* 8.9*  HCT 28.3* 30.6* 30.8* 28.8*  MCV 91.3 93.0 91.4 95.4  PLT 397 376  403* 382   Basic Metabolic Panel: Recent Labs  Lab 11/16/23 1547 11/17/23 0313 11/18/23 0305 11/19/23 0306  NA 138 136 135 136  K 3.2* 3.7 3.5 3.3*  CL 102 101 98 99  CO2 26 27 26 26   GLUCOSE 130* 122* 96 109*  BUN 19 13 11 15   CREATININE 0.73 0.57* 0.59* 0.36*  CALCIUM  8.8* 8.0* 8.6* 8.3*   GFR: Estimated Creatinine Clearance: 91.8 mL/min (A) (by C-G formula based on SCr of 0.36 mg/dL (L)). Liver Function Tests: Recent Labs  Lab 11/16/23 1547 11/18/23 0305  AST 28 24  ALT 23 22  ALKPHOS 107 102  BILITOT 0.4 0.7  PROT 6.5 6.2*  ALBUMIN 2.9* 2.6*   No results for input(s): "LIPASE", "AMYLASE" in the last 168 hours. Recent Labs  Lab 11/16/23 1547  AMMONIA 23   Coagulation Profile: Recent Labs  Lab 11/17/23 0313  INR 1.1   Cardiac Enzymes: No results for input(s): "CKTOTAL", "CKMB", "CKMBINDEX", "TROPONINI" in the last 168 hours. BNP (last 3 results) No results for input(s): "PROBNP" in the last 8760 hours. HbA1C: No results for input(s): "HGBA1C" in the last 72 hours. CBG: No results for input(s): "GLUCAP" in the last 168 hours. Lipid Profile: No results for input(s): "CHOL", "HDL", "LDLCALC", "TRIG", "CHOLHDL", "LDLDIRECT" in the last 72 hours. Thyroid  Function Tests: No results for input(s): "TSH", "T4TOTAL", "FREET4", "T3FREE", "THYROIDAB" in the last 72 hours. Anemia Panel: No results for input(s): "VITAMINB12", "FOLATE", "FERRITIN", "TIBC", "IRON", "RETICCTPCT" in the last 72 hours. Sepsis Labs: Recent Labs  Lab 11/16/23 1745 11/16/23 2050  LATICACIDVEN 1.0 1.1    Recent Results (from the past 240 hours)  Blood culture (routine x 2)     Status: None (Preliminary result)   Collection Time: 11/16/23  5:45 PM   Specimen: BLOOD  Result Value Ref Range Status   Specimen Description   Final    BLOOD LEFT ANTECUBITAL Performed at [facility], [address], [place], [place] [address]    Special Requests   Final    BOTTLES  DRAWN AEROBIC AND ANAEROBIC Blood Culture results may not be optimal due to an inadequate volume of blood received in culture bottles Performed at [facility], [address], [place], [place] [address]    Culture   Final    NO GROWTH 3 DAYS Performed at [facility], [address] [place], [place] [address]    Report Status PENDING  Incomplete  Blood culture (routine x 2)     Status: None (Preliminary result)   Collection Time: 11/16/23  5:45 PM   Specimen: BLOOD  Result Value Ref Range Status   Specimen Description   Final    BLOOD RIGHT ANTECUBITAL Performed at [facility], [address], [place], [place] [address]    Special Requests   Final    BOTTLES DRAWN AEROBIC AND ANAEROBIC Blood Culture results may  not be optimal due to an inadequate volume of blood received in culture bottles Performed at [facility], [address] [place], [place] [address]    Culture   Final    NO GROWTH 3 DAYS Performed at [facility], [address] [place], [place] [address]    Report Status PENDING  Incomplete  MRSA Next Gen by PCR, Nasal     Status: None   Collection Time: 11/17/23  5:42 AM   Specimen: Nasal Mucosa; Nasal Swab  Result Value Ref Range Status   MRSA by PCR Next Gen NOT DETECTED NOT DETECTED Final    Comment: (NOTE) The GeneXpert MRSA Assay (FDA approved for NASAL specimens only), is one component of a comprehensive MRSA colonization surveillance program. It is not intended to diagnose MRSA infection nor to guide or monitor treatment for MRSA infections. Test performance is not FDA approved in patients less than 37 years old. Performed at [facility], [address] [place], [place] [address]   Culture, body fluid w Gram Stain-bottle     Status: None (Preliminary result)   Collection Time: 11/17/23  1:45 PM   Specimen: Pleura  Result Value Ref Range Status   Specimen  Description PLEURAL  Final   Special Requests NONE  Final   Culture   Final    NO GROWTH 2 DAYS Performed at [facility], [address] [place], [place] [address]    Report Status PENDING  Incomplete  Gram stain     Status: None   Collection Time: 11/17/23  1:45 PM   Specimen: Pleura  Result Value Ref Range Status   Specimen Description PLEURAL  Final   Special Requests NONE  Final   Gram Stain   Final    WBC PRESENT,BOTH PMN AND MONONUCLEAR NO ORGANISMS SEEN CYTOSPIN SMEAR Performed at [facility], [address] [place], [place] [address]    Report Status 11/17/2023 FINAL  Final         Radiology Studies: DG Chest 1 View Result Date: 11/17/2023 CLINICAL DATA:  [id number] S/P thoracentesis [id number] EXAM: PORTABLE CHEST 1 VIEW COMPARISON:  Chest XR, 11/15/2023.  CT chest, 08/13/2023. FINDINGS: Support lines: Spinal cord stimulator, with leads extending outside field of view. Cardiomediastinal silhouette is within normal limits. The LEFT lung is well inflated. Improved aeration of the RIGHT lung post thoracentesis with small volume residual pleural effusion. RIGHT perihilar pulmonary opacity with trace residual fluid layering along the minor fissure. No pneumothorax. RIGHT AC joint degenerative change. No acute osseous abnormality. IMPRESSION: Improved aeration of the RIGHT lung post thoracentesis with small volume residual pleural effusion. No pneumothorax. Electronically Signed   By: Art Largo M.D.   On: 11/17/2023 16:54   US  THORACENTESIS ASP PLEURAL SPACE W/IMG GUIDE Result Date: 11/17/2023 INDICATION: Patient with history of non-small cell lung cancer of right lung, recurrent pleural effusion. IR consulted for diagnostic and therapeutic right thoracentesis. EXAM: ULTRASOUND GUIDED DIAGNOSTIC AND THERAPEUTIC RIGHT THORACENTESIS MEDICATIONS: 10 mL 1% lidocaine  COMPLICATIONS: None immediate. PROCEDURE: An ultrasound guided thoracentesis was thoroughly discussed with the  patient and questions answered. The benefits, risks, alternatives and complications were also discussed. The patient understands and wishes to proceed with the procedure. Written consent was obtained. Ultrasound was performed to localize and mark an adequate pocket of fluid in the right chest. The area was then prepped and draped in the normal sterile fashion. 1% Lidocaine  was used for local anesthesia. Under ultrasound  guidance a 6 Fr Safe-T-Centesis catheter was introduced. Thoracentesis was performed. The catheter was removed and a dressing applied. FINDINGS: A total of approximately 820 mL of yellow fluid was removed. Samples were sent to the laboratory as requested by the clinical team. IMPRESSION: Successful ultrasound guided diagnostic and therapeutic RIGHT thoracentesis yielding 820 mL of pleural fluid. Performed by: Wyatt Pommier, PA-C Electronically Signed   By: Art Largo M.D.   On: 11/17/2023 16:41    Scheduled Meds:  budeson-glycopyrrolate -formoterol   2 puff Inhalation BID   Chlorhexidine  Gluconate Cloth  6 each Topical Daily   enoxaparin  (LOVENOX ) injection  40 mg Subcutaneous Q24H   levothyroxine   100 mcg Oral QAC breakfast   mouth rinse  15 mL Mouth Rinse 4 times per day   sodium chloride  flush  3 mL Intravenous Q12H   Continuous Infusions:  azithromycin  Stopped (11/18/23 1743)   cefTRIAXone  (ROCEPHIN )  IV Stopped (11/19/23 1010)     LOS: 3 days    Time spent: 39 min   Barbee Lew, MD  11/19/2023, 12:42 PM

## 2023-11-19 NOTE — Progress Notes (Signed)
 Patient was unable to undergo MRI even after RN gave PRN IV Ativan . Radiology Techs from MRI told RN that patient was moving all over the place despite the PRN IV Ativan . RN reached out to MD to see if there was anything else patient can get to help him be able to get the MRI.

## 2023-11-20 ENCOUNTER — Inpatient Hospital Stay (HOSPITAL_COMMUNITY)

## 2023-11-20 DIAGNOSIS — Z515 Encounter for palliative care: Secondary | ICD-10-CM

## 2023-11-20 DIAGNOSIS — Z7189 Other specified counseling: Secondary | ICD-10-CM | POA: Diagnosis not present

## 2023-11-20 DIAGNOSIS — R443 Hallucinations, unspecified: Secondary | ICD-10-CM | POA: Diagnosis not present

## 2023-11-20 DIAGNOSIS — J189 Pneumonia, unspecified organism: Secondary | ICD-10-CM | POA: Diagnosis not present

## 2023-11-20 LAB — COMPREHENSIVE METABOLIC PANEL WITH GFR
ALT: 18 U/L (ref 0–44)
AST: 17 U/L (ref 15–41)
Albumin: 2.6 g/dL — ABNORMAL LOW (ref 3.5–5.0)
Alkaline Phosphatase: 83 U/L (ref 38–126)
Anion gap: 11 (ref 5–15)
BUN: 9 mg/dL (ref 6–20)
CO2: 27 mmol/L (ref 22–32)
Calcium: 8.7 mg/dL — ABNORMAL LOW (ref 8.9–10.3)
Chloride: 102 mmol/L (ref 98–111)
Creatinine, Ser: 0.42 mg/dL — ABNORMAL LOW (ref 0.61–1.24)
GFR, Estimated: >60 mL/min (ref >60)
Glucose, Bld: 95 mg/dL (ref 70–99)
Potassium: 3.9 mmol/L (ref 3.5–5.1)
Sodium: 140 mmol/L (ref 135–145)
Total Bilirubin: 0.6 mg/dL (ref 0.0–1.2)
Total Protein: 6 g/dL — ABNORMAL LOW (ref 6.5–8.1)

## 2023-11-20 LAB — CBC
HCT: 31.7 % — ABNORMAL LOW (ref 39.0–52.0)
Hemoglobin: 9.7 g/dL — ABNORMAL LOW (ref 13.0–17.0)
MCH: 29.4 pg (ref 26.0–34.0)
MCHC: 30.6 g/dL (ref 30.0–36.0)
MCV: 96.1 fL (ref 80.0–100.0)
Platelets: 426 10*3/uL — ABNORMAL HIGH (ref 150–400)
RBC: 3.3 MIL/uL — ABNORMAL LOW (ref 4.22–5.81)
RDW: 19.5 % — ABNORMAL HIGH (ref 11.5–15.5)
WBC: 14.7 10*3/uL — ABNORMAL HIGH (ref 4.0–10.5)
nRBC: 0 % (ref 0.0–0.2)

## 2023-11-20 NOTE — Plan of Care (Signed)
  Problem: Education: Goal: Knowledge of General Education information will improve Description: Including pain rating scale, medication(s)/side effects and non-pharmacologic comfort measures Outcome: Not Progressing   Problem: Health Behavior/Discharge Planning: Goal: Ability to manage health-related needs will improve Outcome: Not Progressing   Problem: Clinical Measurements: Goal: Ability to maintain clinical measurements within normal limits will improve Outcome: Progressing Goal: Will remain free from infection Outcome: Progressing Goal: Diagnostic test results will improve Outcome: Progressing Goal: Respiratory complications will improve Outcome: Progressing Goal: Cardiovascular complication will be avoided Outcome: Progressing   

## 2023-11-20 NOTE — Progress Notes (Signed)
 PROGRESS NOTE    Brian [name]  ZOX:[id number] DOB: [birth year] DOA: 11/16/2023 PCP: [name], MD   Brief Narrative:62 y.o. male with medical history significant of medical issues as listed below. Patient has chronic right upper extremity palsy.  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 See onc history below. Furhter patient has had frequent right chest thoracentesis to drain fluid of the chest in the past.   [name] seems to have been in usual state of health till approximately 3 or 4 days ago when the patient started having increasing shortness of breath which is principally during periods of exertion such as walking.  It was associated with some cough and congestion/expectoration.  Patient also had increased shortness of breath sensation at night and would occasionally wake up with same.  Patient noticed his SpO2 at home was 87% on room air.  Prompting him to come to the ER today.   Patient reports some lower extremity swelling bilaterally.  However no chest pain no fever.    Another concern for the patient has been increased confusion at home.  For example wife reports patient rush patient rushing downstairs today and saying that his forearm had people inside that needed to be taken out.  There has been similar confusion noted here in the ER tonight.  Patient has gotten out of his stretcher and check the back of it to see if the people there are gone.   Otherwise people patient has been noted to be hypoxic slightly in the ER stabilized on 2 L/min of supplementary oxygen .  Rest of the ER course has been unremarkable.  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  With right pleural effusion He is status post thoracentesis 850 cc of fluid removed from his right . Oxygen  requirement improving now on 4 L  Continue Rocephin  and azithromycin . Bipap at bedtime and prn   ABG 7.28/67/71 4/18 abg 7.40/52/64  #2 acute metabolic toxic encephalopathy -likely multifactorial secondary to #1 and side effect from his chemo [name] .  He became more delirious and restless after getting Ativan  prior to getting MRI of the brain.  Will add Ativan  to the allergy list.  #3 chronic anemia hemoglobin stable follow-up labs today pending  #4 chronic atrophy of the right more than left upper extremity with weakness  #5 adenocarcinoma with metastasis lung cancer oncology following Last xrt 4/17 Mri brain today to rule out mets   #6 hypokalemia potassium was 3.3 yesterday which was replaced Will check levels today along with mag levels and replete if needed     Estimated body mass index is 23.6 kg/m as calculated from the following:   Height as of this encounter: 5\' 8"  (1.727 m).   Weight as of this encounter: 70.4 kg.  DVT prophylaxis: lovenox  Code Status: full Family Communication:wife at bedside  Disposition Plan:  Status is: Inpatient Remains inpatient appropriate because: acute illness   Consultants: onc ir   Procedures: thora 4/16 right  Antimicrobials: rocephin  azithro  Subjective:  Resting in bed much sleepier than yesterday Will attempt to see if we can get the MRI done now since he is calm down and Ativan  still in the system.  Objective: Vitals:   11/20/23 0132 11/20/23 0632 11/20/23 0759 11/20/23 0830  BP: 131/74 (!) 156/90  122/83  Pulse: (!) 114 (!) 121  (!) 113  Resp:  20  20  Temp:  98 F (36.7 C) 98.2 F (36.8 C)  98.3 F (36.8 C)  TempSrc: Oral   Oral  SpO2: 99% 97% 97% 100%  Weight:      Height:        Intake/Output Summary (Last 24 hours) at 11/20/2023 1054 Last data filed at 11/20/2023 0630 Gross per 24 hour  Intake 411.14 ml  Output 600 ml  Net -188.86 ml   Filed Weights   11/17/23 0227 11/19/23 1259  Weight: 70.6 kg 70.4 kg    Examination:  General exam: Appears chronically ill Respiratory system: diminished bs   Cardiovascular system: tachy Gastrointestinal system: Abdomen is nondistended, soft and nontender. No organomegaly or masses felt. Normal bowel sounds heard. Central nervous system: Resting not alert  extremities: trace edema    Data Reviewed: I have personally reviewed following labs and imaging studies  CBC: Recent Labs  Lab 11/16/23 1547 11/17/23 0313 11/18/23 0305 11/19/23 0306  WBC 16.9* 19.5* 19.4* 16.7*  NEUTROABS 12.3*  --   --   --   HGB 9.2* 9.7* 9.8* 8.9*  HCT 28.3* 30.6* 30.8* 28.8*  MCV 91.3 93.0 91.4 95.4  PLT 397 376 403* 382   Basic Metabolic Panel: Recent Labs  Lab 11/16/23 1547 11/17/23 0313 11/18/23 0305 11/19/23 0306  NA 138 136 135 136  K 3.2* 3.7 3.5 3.3*  CL 102 101 98 99  CO2 26 27 26 26   GLUCOSE 130* 122* 96 109*  BUN 19 13 11 15   CREATININE 0.73 0.57* 0.59* 0.36*  CALCIUM  8.8* 8.0* 8.6* 8.3*   GFR: Estimated Creatinine Clearance: 95 mL/min (A) (by C-G formula based on SCr of 0.36 mg/dL (L)). Liver Function Tests: Recent Labs  Lab 11/16/23 1547 11/18/23 0305  AST 28 24  ALT 23 22  ALKPHOS 107 102  BILITOT 0.4 0.7  PROT 6.5 6.2*  ALBUMIN 2.9* 2.6*   No results for input(s): "LIPASE", "AMYLASE" in the last 168 hours. Recent Labs  Lab 11/16/23 1547  AMMONIA 23   Coagulation Profile: Recent Labs  Lab 11/17/23 0313  INR 1.1   Cardiac Enzymes: No results for input(s): "CKTOTAL", "CKMB", "CKMBINDEX", "TROPONINI" in the last 168 hours. BNP (last 3 results) No results for input(s): "PROBNP" in the last 8760 hours. HbA1C: No results for input(s): "HGBA1C" in the last 72 hours. CBG: No results for input(s): "GLUCAP" in the last 168 hours. Lipid Profile: No results for input(s): "CHOL", "HDL", "LDLCALC", "TRIG", "CHOLHDL", "LDLDIRECT" in the last 72 hours. Thyroid  Function Tests: No results for input(s): "TSH", "T4TOTAL", "FREET4", "T3FREE", "THYROIDAB" in the last 72 hours. Anemia Panel: No results for input(s):  "VITAMINB12", "FOLATE", "FERRITIN", "TIBC", "IRON", "RETICCTPCT" in the last 72 hours. Sepsis Labs: Recent Labs  Lab 11/16/23 1745 11/16/23 2050  LATICACIDVEN 1.0 1.1    Recent Results (from the past 240 hours)  Blood culture (routine x 2)     Status: None (Preliminary result)   Collection Time: 11/16/23  5:45 PM   Specimen: BLOOD  Result Value Ref Range Status   Specimen Description   Final    BLOOD LEFT ANTECUBITAL Performed at [facility], [address], [place], [place] [address]    Special Requests   Final    BOTTLES DRAWN AEROBIC AND ANAEROBIC Blood Culture results may not be optimal due to an inadequate volume of blood received in culture bottles Performed at [facility], [address], [place], [place] [address]    Culture   Final  NO GROWTH 4 DAYS Performed at [facility], [address] [place], [place] [address]    Report Status PENDING  Incomplete  Blood culture (routine x 2)     Status: None (Preliminary result)   Collection Time: 11/16/23  5:45 PM   Specimen: BLOOD  Result Value Ref Range Status   Specimen Description   Final    BLOOD RIGHT ANTECUBITAL Performed at [facility], [address] [place], [place] [address]    Special Requests   Final    BOTTLES DRAWN AEROBIC AND ANAEROBIC Blood Culture results may not be optimal due to an inadequate volume of blood received in culture bottles Performed at [facility], [address] [place], [place] [address]    Culture   Final    NO GROWTH 4 DAYS Performed at [facility], [address], [place], [place] [address]    Report Status PENDING  Incomplete  MRSA Next Gen by PCR, Nasal     Status: None   Collection Time: 11/17/23  5:42 AM   Specimen: Nasal Mucosa; Nasal Swab  Result Value Ref Range Status   MRSA by PCR Next Gen NOT DETECTED NOT DETECTED Final    Comment: (NOTE) The GeneXpert MRSA Assay (FDA  approved for NASAL specimens only), is one component of a comprehensive MRSA colonization surveillance program. It is not intended to diagnose MRSA infection nor to guide or monitor treatment for MRSA infections. Test performance is not FDA approved in patients less than 72 years old. Performed at [facility], [address], [place], [place] [address]   Acid Fast Smear (AFB)     Status: None   Collection Time: 11/17/23  1:45 PM   Specimen: PATH Cytology Pleural fluid  Result Value Ref Range Status   AFB Specimen Processing Concentration  Final   Acid Fast Smear Negative  Final    Comment: (NOTE) Performed At: [facility] [address] [place], [place] [id number] Pearlean Botts MD ON:[id number]    Source (AFB) PLEURAL  Final    Comment: Performed at [facility], [address], [place], [place] [address]  Culture, body fluid w Gram Stain-bottle     Status: None (Preliminary result)   Collection Time: 11/17/23  1:45 PM   Specimen: Pleura  Result Value Ref Range Status   Specimen Description PLEURAL  Final   Special Requests NONE  Final   Culture   Final    NO GROWTH 3 DAYS Performed at [facility], [address] [place], [place] [address]    Report Status PENDING  Incomplete  Gram stain     Status: None   Collection Time: 11/17/23  1:45 PM   Specimen: Pleura  Result Value Ref Range Status   Specimen Description PLEURAL  Final   Special Requests NONE  Final   Gram Stain   Final    WBC PRESENT,BOTH PMN AND MONONUCLEAR NO ORGANISMS SEEN CYTOSPIN SMEAR Performed at [facility], [address] [place], [place] [address]    Report Status 11/17/2023 FINAL  Final         Radiology Studies: No results found.   Scheduled Meds:  budeson-glycopyrrolate -formoterol   2 puff Inhalation BID   enoxaparin  (LOVENOX ) injection  40 mg Subcutaneous Q24H   feeding supplement  237 mL Oral BID BM   levothyroxine   100  mcg Oral QAC breakfast   mouth rinse  15 mL Mouth  Rinse 4 times per day   sodium chloride  flush  3 mL Intravenous Q12H   Continuous Infusions:  cefTRIAXone  (ROCEPHIN )  IV 2 g (11/20/23 0852)     LOS: 4 days    Time spent: 39 min   Barbee Lew, MD  11/20/2023, 10:54 AM

## 2023-11-20 NOTE — Progress Notes (Signed)
 Daily Progress Note   Patient Name: Brian [name]       Date: 11/20/2023 DOB: [birth year]  Age: 63 y.o. MRN#: [id number] Attending Physician: Barbee Lew, MD Primary Care Physician: [name], MD Admit Date: 11/16/2023  Reason for Consultation/Follow-up: Establishing goals of care  Subjective: Confused, restless, somewhat agitated as well not able to go down for MRI brain due to confusion, wife at bedside.    Length of Stay: 4  Current Medications: Scheduled Meds:   budeson-glycopyrrolate -formoterol   2 puff Inhalation BID   enoxaparin  (LOVENOX ) injection  40 mg Subcutaneous Q24H   feeding supplement  237 mL Oral BID BM   levothyroxine   100 mcg Oral QAC breakfast   mouth rinse  15 mL Mouth Rinse 4 times per day   sodium chloride  flush  3 mL Intravenous Q12H    Continuous Infusions:  cefTRIAXone  (ROCEPHIN )  IV 2 g (11/20/23 0852)    PRN Meds: acetaminophen  **OR** acetaminophen , albuterol , gadobutrol , haloperidol  lactate, mouth rinse, polyethylene glycol  Physical Exam         Restless and confused No edema mild distress    Vital Signs: BP 118/79   Pulse (!) 113   Temp 99 F (37.2 C) (Oral)   Resp 20   Ht 5\' 8"  (1.727 m)   Wt 70.4 kg   SpO2 98%   BMI 23.60 kg/m  SpO2: SpO2: 98 % O2 Device: O2 Device: Nasal Cannula O2 Flow Rate: O2 Flow Rate (L/min): 3 L/min  Intake/output summary:  Intake/Output Summary (Last 24 hours) at 11/20/2023 1508 Last data filed at 11/20/2023 0630 Gross per 24 hour  Intake 191.14 ml  Output 600 ml  Net -408.86 ml   LBM: Last BM Date : 11/18/23 Baseline Weight: Weight: 70.6 kg Most recent weight: Weight: 70.4 kg       Palliative Assessment/Data:      Patient Active Problem List   Diagnosis Date Noted    Hallucinations 11/20/2023   Pneumonia 11/16/2023   AMS (altered mental status) 11/16/2023   Complex regional pain syndrome type 1 of left lower extremity 07/20/2023   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 07/20/2023   Chronic pain syndrome 07/20/2023   Complex regional pain syndrome type 1 of right upper extremity 05/04/2023   Right arm pain 05/04/2023   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   Recurrent right pleural effusion 07/23/2021   Hypothyroidism 10/24/2020   Acute hypoxemic respiratory failure (HCC) 05/19/2020   Acute respiratory failure with hypoxia (HCC) 05/18/2020   Essential hypertension 05/18/2020   Type 2 diabetes mellitus with hyperlipidemia (HCC) 05/18/2020   S/P thoracentesis 03/21/2020   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/05/2020   Calf swelling 02/21/2020   Rash 02/07/2020   Adenocarcinoma of right lung, stage 4 (HCC) 02/07/2020   Chronic cough 12/20/2019   Venous thrombosis 09/21/2019   Shortness of breath 09/07/2019   Headache 08/09/2019   Lobar pneumonia, unspecified organism (HCC) 03/07/2019   Centrilobular emphysema (HCC) 03/07/2019   Encounter for antineoplastic immunotherapy 11/10/2018   Adenocarcinoma of right lung, stage 3 (HCC) 08/25/2018   Goals of care, counseling/discussion 08/25/2018   Encounter for antineoplastic chemotherapy 08/25/2018   Mass of upper lobe of right lung 08/04/2018   Subarachnoid hemorrhage (HCC) 05/22/2015    Palliative Care Assessment & Plan   Patient Profile:  63 year old gentleman who lives at home with his wife, chronic right upper extremity palsy, history of metastatic disease in the brain, life limiting illness of non-small cell lung cancer metastatic, admitted with acute hypoxic respiratory failure secondary to community-acquired pneumonia, right-sided pleural effusion for which patient underwent thoracentesis and 850 cc of fluid was removed from her right lung. Palliative care consulted for goals of  care discussions.  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  With right pleural effusion  acute metabolic toxic encephalopathy -likely multifactorial secondary to #1 and side effect from his chemo [name] .   Recommendations/Plan:  MRI brain not able to be done due to possible paradoxical reaction to Ativan . Now on Haldol  IV PRN and being monitored.  Brief life review, code status and GOC discussions undertaken: full code full scope care. Monitor hospital course.   Goals of Care and Additional Recommendations: Limitations on Scope of Treatment: Full Scope Treatment  Code Status:    Code Status Orders  (From admission, onward)           Start     Ordered   11/16/23 1742  Full code  Continuous       Question:  By:  Answer:  Other   11/16/23 1743           Code Status History     Date Active Date Inactive Code Status Order ID Comments User Context   05/18/2020 1425 05/20/2020 2214 DNR [id number]  Karalee Oscar, MD ED   05/28/2015 1505 05/29/2015 1337 Full Code [id number]  Augusto Blonder, MD Inpatient   05/22/2015 0104 05/28/2015 1505 Full Code [id number]  Augusto Blonder, MD ED       Prognosis:  Unable to determine  Discharge Planning: To Be Determined  Care plan was discussed with patient wife Dr Alla Isaacs.   Thank you for allowing the Palliative Medicine Team to assist in the care of this patient. Mod MDM.      Greater than 50%  of this time was spent counseling and coordinating care related to the above assessment and plan.  Lujean Sake, MD  Please contact Palliative Medicine Team phone at [phone] for questions and concerns.

## 2023-11-20 NOTE — Progress Notes (Signed)
 Rapid Response Event Note   Reason for Call : new confusion   Initial Focused Assessment: pt has agitation, and confused.  Pupils intact.  Oriented x 1 (name only).  Lung sounds decreased bilaterally.  Pulses intact.  Sitter at the bedside.   Interventions: VS per flowsheet.  Pt received Haldol  IV.  ABG complete.   Plan of Care: pt will remain in current location per [facility], NP (pt not requiring BIPAP at this time).    Event Summary:   MD Notified: yes Call Time: 2010 Arrival Time: 2015 End Time:2100  Vijay Durflinger Lavern, RN

## 2023-11-20 NOTE — Progress Notes (Signed)
 OT Cancellation Note  Patient Details Name: Brian [name] MRN: [id number] DOB: 10/11/1960   Cancelled Treatment:    Reason Eval/Treat Not Completed: Medical issues which prohibited therapy;Fatigue/lethargy limiting ability to participate OT/PT attempted co-session for OT evaluation. Patient sedated and lethargic unable to participate in eval this day. OT will re-attempt as soon as patient able and OT schedule allows.   Dorlisa Savino OT/L Acute Rehabilitation Department  [phone]    11/20/2023, 11:23 AM

## 2023-11-20 NOTE — Progress Notes (Signed)
 Bipap order is PRN and not needed at this time.  Patient currently on 2L with sat of 100%.  Will continue to monitor.

## 2023-11-20 NOTE — Progress Notes (Signed)
 PT Cancellation Note  Patient Details Name: GAYLIN BULTHUIS MRN: [id number] DOB: 1961/01/15   Cancelled Treatment:    Reason Eval/Treat Not Completed: Patient's level of consciousness  Abelina Hoes PT Acute Rehabilitation Services Office [phone] Weekend [phone]  Dareen Ebbing 11/20/2023, 12:43 PM

## 2023-11-21 DIAGNOSIS — J189 Pneumonia, unspecified organism: Secondary | ICD-10-CM | POA: Diagnosis not present

## 2023-11-21 LAB — COMPREHENSIVE METABOLIC PANEL WITH GFR
ALT: 19 U/L (ref 0–44)
AST: 20 U/L (ref 15–41)
Albumin: 2.7 g/dL — ABNORMAL LOW (ref 3.5–5.0)
Alkaline Phosphatase: 91 U/L (ref 38–126)
Anion gap: 11 (ref 5–15)
BUN: 9 mg/dL (ref 6–20)
CO2: 29 mmol/L (ref 22–32)
Calcium: 8.9 mg/dL (ref 8.9–10.3)
Chloride: 96 mmol/L — ABNORMAL LOW (ref 98–111)
Creatinine, Ser: 0.49 mg/dL — ABNORMAL LOW (ref 0.61–1.24)
GFR, Estimated: >60 mL/min (ref >60)
Glucose, Bld: 92 mg/dL (ref 70–99)
Potassium: 3.6 mmol/L (ref 3.5–5.1)
Sodium: 136 mmol/L (ref 135–145)
Total Bilirubin: 0.7 mg/dL (ref 0.0–1.2)
Total Protein: 6.5 g/dL (ref 6.5–8.1)

## 2023-11-21 LAB — CBC
HCT: 31.4 % — ABNORMAL LOW (ref 39.0–52.0)
Hemoglobin: 9.9 g/dL — ABNORMAL LOW (ref 13.0–17.0)
MCH: 29.3 pg (ref 26.0–34.0)
MCHC: 31.5 g/dL (ref 30.0–36.0)
MCV: 92.9 fL (ref 80.0–100.0)
Platelets: 449 10*3/uL — ABNORMAL HIGH (ref 150–400)
RBC: 3.38 MIL/uL — ABNORMAL LOW (ref 4.22–5.81)
RDW: 19.3 % — ABNORMAL HIGH (ref 11.5–15.5)
WBC: 15.4 10*3/uL — ABNORMAL HIGH (ref 4.0–10.5)
nRBC: 0 % (ref 0.0–0.2)

## 2023-11-21 LAB — CULTURE, BLOOD (ROUTINE X 2)
Culture: NO GROWTH
Culture: NO GROWTH

## 2023-11-21 MED ORDER — FLUTICASONE PROPIONATE 50 MCG/ACT NA SUSP
1.0000 | Freq: Every day | NASAL | Status: DC
  Administered 2023-11-21 – 2023-11-22 (×2): 1 via NASAL
  Filled 2023-11-21: qty 16

## 2023-11-21 NOTE — Plan of Care (Signed)

## 2023-11-21 NOTE — Progress Notes (Signed)
 PROGRESS NOTE    TIMOTHEE [name]  RUE:[id number] DOB: 07/25/1961 DOA: 11/16/2023 PCP: [name], MD   Brief Narrative:63 y.o. male with medical history significant of medical issues as listed below. Patient has chronic right upper extremity palsy.  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 See onc history below. Furhter patient has had frequent right chest thoracentesis to drain fluid of the chest in the past.   [name] seems to have been in usual state of health till approximately 3 or 4 days ago when the patient started having increasing shortness of breath which is principally during periods of exertion such as walking.  It was associated with some cough and congestion/expectoration.  Patient also had increased shortness of breath sensation at night and would occasionally wake up with same.  Patient noticed his SpO2 at home was 87% on room air.  Prompting him to come to the ER today.   Patient reports some lower extremity swelling bilaterally.  However no chest pain no fever.    Another concern for the patient has been increased confusion at home.  For example wife reports patient rush patient rushing downstairs today and saying that his forearm had people inside that needed to be taken out.  There has been similar confusion noted here in the ER tonight.  Patient has gotten out of his stretcher and check the back of it to see if the people there are gone.   Otherwise people patient has been noted to be hypoxic slightly in the ER stabilized on 2 L/min of supplementary oxygen .  Rest of the ER course has been unremarkable.  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  With right pleural effusion He is status post thoracentesis 850 cc of fluid removed from his right . Oxygen  requirement improving now on 2 L  Continue Rocephin  and azithromycin . Bipap at bedtime and prn   ABG 7.28/67/71 4/18 abg 7.40/52/64  #2 acute metabolic toxic encephalopathy -likely multifactorial secondary to #1 and side effect from his chemo [name] .  He became more delirious and restless after getting Ativan  prior to getting MRI of the brain.  Will add Ativan  to the allergy list.  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   Weight as of this encounter: 70.4 kg.  DVT prophylaxis: lovenox  Code Status: full Family Communication:wife at bedside  Disposition Plan:  Status is: Inpatient Remains inpatient appropriate because: acute illness   Consultants: onc ir   Procedures: thora 4/16 right  Antimicrobials: rocephin  azithro  Subjective:   He reports he is very claustrophobic and not able to breathe if he lays flat due to sinus issues Much more awake and alert today and is able to answer questions appropriately and follow commands Objective: Vitals:   11/20/23 2310 11/20/23 2315 11/21/23 0011 11/21/23 0342  BP:   (!) 142/93 (!) 137/97  Pulse:   (!) 108 (!) 108  Resp: (!) 8 16 20 18   Temp:  98.7 F (37.1 C) 98.7 F (37.1 C)  TempSrc:   Oral Oral  SpO2:   96% 100%  Weight:      Height:        Intake/Output Summary (Last 24 hours) at 11/21/2023 0950 Last data filed at 11/21/2023 0900 Gross per 24 hour  Intake 1047.83 ml  Output 1250 ml  Net -202.17 ml   Filed Weights   11/17/23 0227 11/19/23 1259  Weight: 70.6 kg 70.4 kg    Examination:  General exam: Appears chronically ill Respiratory system: diminished bs   Cardiovascular system: tachy Gastrointestinal system: Abdomen is nondistended, soft and nontender. No organomegaly or masses felt. Normal bowel sounds heard. Central nervous system: Resting not alert  extremities: trace edema    Data Reviewed: I have personally reviewed following labs and imaging studies  CBC: Recent Labs  Lab 11/16/23 1547 11/17/23 0313 11/18/23 0305 11/19/23 0306 11/20/23 1129 11/21/23 0407  WBC 16.9* 19.5* 19.4* 16.7* 14.7* 15.4*  NEUTROABS 12.3*  --   --   --   --   --   HGB 9.2* 9.7* 9.8* 8.9* 9.7* 9.9*  HCT 28.3* 30.6* 30.8* 28.8* 31.7* 31.4*  MCV 91.3 93.0 91.4 95.4 96.1 92.9  PLT 397 376 403* 382 426* 449*   Basic Metabolic Panel: Recent Labs  Lab 11/17/23 0313 11/18/23 0305 11/19/23 0306 11/20/23 1129 11/21/23 0407  NA 136 135 136 140 136  K 3.7 3.5 3.3* 3.9 3.6  CL 101 98 99 102 96*  CO2 27 26 26 27 29   GLUCOSE 122* 96 109* 95 92  BUN 13 11 15 9 9   CREATININE 0.57* 0.59* 0.36* 0.42* 0.49*  CALCIUM  8.0* 8.6* 8.3* 8.7* 8.9   GFR: Estimated Creatinine Clearance: 95 mL/min (A) (by C-G formula based on SCr of 0.49 mg/dL (L)). Liver Function Tests: Recent Labs  Lab 11/16/23 1547 11/18/23 0305 11/20/23 1129 11/21/23 0407  AST 28 24 17 20   ALT 23 22 18 19   ALKPHOS 107 102 83 91  BILITOT 0.4 0.7 0.6 0.7  PROT 6.5 6.2* 6.0* 6.5  ALBUMIN 2.9* 2.6* 2.6* 2.7*   No results for input(s): "LIPASE", "AMYLASE" in the last 168 hours. Recent Labs  Lab 11/16/23 1547  AMMONIA 23   Coagulation Profile: Recent Labs  Lab 11/17/23 0313  INR 1.1   Cardiac Enzymes: No results for input(s): "CKTOTAL", "CKMB", "CKMBINDEX", "TROPONINI" in the last 168 hours. BNP (last 3 results) No results for input(s): "PROBNP" in the last 8760 hours. HbA1C: No results for input(s): "HGBA1C" in the last 72 hours. CBG: No results for input(s): "GLUCAP" in the last 168 hours. Lipid Profile: No results for input(s): "CHOL", "HDL", "LDLCALC", "TRIG", "CHOLHDL",  "LDLDIRECT" in the last 72 hours. Thyroid  Function Tests: No results for input(s): "TSH", "T4TOTAL", "FREET4", "T3FREE", "THYROIDAB" in the last 72 hours. Anemia Panel: No results for input(s): "VITAMINB12", "FOLATE", "FERRITIN", "TIBC", "IRON", "RETICCTPCT" in the last 72 hours. Sepsis Labs: Recent Labs  Lab 11/16/23 1745 11/16/23 2050  LATICACIDVEN 1.0 1.1    Recent Results (from the past 240 hours)  Blood culture (routine x 2)     Status: None   Collection Time: 11/16/23  5:45 PM   Specimen: BLOOD  Result Value Ref Range Status   Specimen Description   Final    BLOOD LEFT ANTECUBITAL Performed at [facility], [address], [place], [place] [address]    Special Requests   Final    BOTTLES DRAWN AEROBIC AND ANAEROBIC Blood Culture  results may not be optimal due to an inadequate volume of blood received in culture bottles Performed at [facility], [address] [place], [place] [address]    Culture   Final    NO GROWTH 5 DAYS Performed at [facility], [address], [place], [place] [address]    Report Status 11/21/2023 FINAL  Final  Blood culture (routine x 2)     Status: None   Collection Time: 11/16/23  5:45 PM   Specimen: BLOOD  Result Value Ref Range Status   Specimen Description   Final    BLOOD RIGHT ANTECUBITAL Performed at [facility], [address], [place], [place] [address]    Special Requests   Final    BOTTLES DRAWN AEROBIC AND ANAEROBIC Blood Culture results may not be optimal due to an inadequate volume of blood received in culture bottles Performed at [facility], [address], [place], [place] [address]    Culture   Final    NO GROWTH 5 DAYS Performed at [facility], [address] [place], [place] [address]    Report Status 11/21/2023 FINAL  Final  MRSA Next Gen by PCR, Nasal     Status: None   Collection Time: 11/17/23  5:42 AM   Specimen: Nasal  Mucosa; Nasal Swab  Result Value Ref Range Status   MRSA by PCR Next Gen NOT DETECTED NOT DETECTED Final    Comment: (NOTE) The GeneXpert MRSA Assay (FDA approved for NASAL specimens only), is one component of a comprehensive MRSA colonization surveillance program. It is not intended to diagnose MRSA infection nor to guide or monitor treatment for MRSA infections. Test performance is not FDA approved in patients less than 54 years old. Performed at [facility], [address] [place], [place] [address]   Acid Fast Smear (AFB)     Status: None   Collection Time: 11/17/23  1:45 PM   Specimen: PATH Cytology Pleural fluid  Result Value Ref Range Status   AFB Specimen Processing Concentration  Final   Acid Fast Smear Negative  Final    Comment: (NOTE) Performed At: [facility] [address] [place], [place] [id number] Pearlean Botts MD BM:[id number]    Source (AFB) PLEURAL  Final    Comment: Performed at [facility], [address], [place], [place] [address]  Culture, body fluid w Gram Stain-bottle     Status: None (Preliminary result)   Collection Time: 11/17/23  1:45 PM   Specimen: Pleura  Result Value Ref Range Status   Specimen Description PLEURAL  Final   Special Requests NONE  Final   Culture   Final    NO GROWTH 4 DAYS Performed at [facility], [address], [place], [place] [address]    Report Status PENDING  Incomplete  Gram stain     Status: None   Collection Time: 11/17/23  1:45 PM   Specimen: Pleura  Result Value Ref Range Status   Specimen Description PLEURAL  Final   Special Requests NONE  Final   Gram Stain   Final    WBC PRESENT,BOTH PMN AND MONONUCLEAR NO ORGANISMS SEEN CYTOSPIN SMEAR Performed at [facility], [address], [place], [place] [address]    Report Status 11/17/2023 FINAL  Final         Radiology Studies: No results found.   Scheduled Meds:   budeson-glycopyrrolate -formoterol   2 puff  Inhalation BID   enoxaparin  (LOVENOX ) injection  40 mg Subcutaneous Q24H   feeding supplement  237 mL Oral BID BM   fluticasone   1 spray Each Nare Daily   levothyroxine   100 mcg Oral QAC breakfast   mouth rinse  15 mL Mouth Rinse 4 times per day   sodium chloride  flush  3 mL Intravenous Q12H   Continuous Infusions:  cefTRIAXone  (ROCEPHIN )  IV 2 g (11/21/23 0802)     LOS: 5 days    Time spent: 39 min   Barbee Lew, MD  11/21/2023, 9:50 AM

## 2023-11-22 ENCOUNTER — Encounter: Admitting: Neurosurgery

## 2023-11-22 ENCOUNTER — Inpatient Hospital Stay (HOSPITAL_COMMUNITY)

## 2023-11-22 DIAGNOSIS — D72829 Elevated white blood cell count, unspecified: Secondary | ICD-10-CM

## 2023-11-22 DIAGNOSIS — D649 Anemia, unspecified: Secondary | ICD-10-CM

## 2023-11-22 DIAGNOSIS — G629 Polyneuropathy, unspecified: Secondary | ICD-10-CM

## 2023-11-22 DIAGNOSIS — J189 Pneumonia, unspecified organism: Secondary | ICD-10-CM | POA: Diagnosis not present

## 2023-11-22 DIAGNOSIS — R4182 Altered mental status, unspecified: Secondary | ICD-10-CM

## 2023-11-22 DIAGNOSIS — J910 Malignant pleural effusion: Secondary | ICD-10-CM

## 2023-11-22 DIAGNOSIS — R443 Hallucinations, unspecified: Secondary | ICD-10-CM | POA: Diagnosis not present

## 2023-11-22 DIAGNOSIS — C3490 Malignant neoplasm of unspecified part of unspecified bronchus or lung: Secondary | ICD-10-CM

## 2023-11-22 LAB — CULTURE, BODY FLUID W GRAM STAIN -BOTTLE: Culture: NO GROWTH

## 2023-11-22 MED ORDER — LIDOCAINE HCL 1 % IJ SOLN
INTRAMUSCULAR | Status: AC
  Filled 2023-11-22: qty 20

## 2023-11-22 MED ORDER — AMLODIPINE BESYLATE 5 MG PO TABS: 2.5000 mg | ORAL_TABLET | Freq: Every morning | ORAL | 2 refills | Status: DC

## 2023-11-22 MED ORDER — FLUTICASONE PROPIONATE 50 MCG/ACT NA SUSP: 1.0000 | Freq: Every day | NASAL | 2 refills | Status: DC

## 2023-11-22 NOTE — Progress Notes (Signed)
 Physical Therapy Treatment Patient Details Name: Brian [name] MRN: [id number] DOB: Jul 18, 1961 Today's Date: 11/22/2023   History of Present Illness 63 year old gentleman admitted 11/16/23 with acute hypoxic respiratory failure secondary to community-acquired pneumonia, right-sided pleural effusion, S/P thoracentesis. PMH.:Lung cancer, brain and [place] mets, HTN, SAH, DVT, IVCfilter, radiation induced RUE plexopathy, Pleaural effusion    PT Comments  Pt is pleasant and willing to work with PT. States he is tired of being in the hospital. Amb ~80' with min assist, SpO2 decr to 84% on RA with rapid recovery on 2L to 95-100%.  Continue PT POC, will benefit from HHPT at d/c    If plan is discharge home, recommend the following: A little help with walking and/or transfers;Help with stairs or ramp for entrance;Direct supervision/assist for medications management;Assistance with cooking/housework;A little help with bathing/dressing/bathroom;Assistance with feeding   Can travel by private vehicle        Equipment Recommendations  None recommended by PT    Recommendations for Other Services       Precautions / Restrictions Precautions Precautions: Fall Precaution/Restrictions Comments: RUE plegic/flaccid Required Braces or Orthoses: Sling;Other Brace Other Brace: pt reports has several slings, has gloves for edema control. pt not earing sling this date Restrictions Weight Bearing Restrictions Per Provider Order: No     Mobility  Bed Mobility               General bed mobility comments: in recliner    Transfers Overall transfer level: Needs assistance Equipment used: 1 person hand held assist, None Transfers: Sit to/from Stand Sit to Stand: Min assist, Contact guard assist           General transfer comment: STS x2; light assist to power up and steady once in standing    Ambulation/Gait Ambulation/Gait assistance: Min assist Gait Distance (Feet): 80 Feet Assistive  device: None Gait Pattern/deviations: Step-through pattern, Decreased stride length, Trunk flexed, Narrow base of support, Drifts right/left       General Gait Details: unsteady gait however no overt LOB. cues for posture, pace, obstacle negotiation and breathing; SpO2=84% on RA; replaced O2 at 2L with sats returning to 95-100% in ~ 20 seconds   Stairs             Wheelchair Mobility     Tilt Bed    Modified Rankin (Stroke Patients Only)       Balance   Sitting-balance support: No upper extremity supported, Feet supported Sitting balance-Leahy Scale: Fair     Standing balance support: No upper extremity supported Standing balance-Leahy Scale: Fair                              Hotel manager: No apparent difficulties  Cognition Arousal: Alert Behavior During Therapy: WFL for tasks assessed/performed   PT - Cognitive impairments: No apparent impairments                       PT - Cognition Comments: pt Ox3; recalls events of morning, length of time OOB etc. Following commands: Intact      Cueing Cueing Techniques: Verbal cues  Exercises General Exercises - Lower Extremity Ankle Circles/Pumps: AROM, Both, 10 reps Long Arc Quad: AROM, 5 reps, Both Hip Flexion/Marching: AROM, Both, 10 reps, Standing    General Comments        Pertinent Vitals/Pain Pain Assessment Pain Assessment: No/denies pain    Home Living  Prior Function            PT Goals (current goals can now be found in the care plan section) Acute Rehab PT Goals Patient Stated Goal: go home PT Goal Formulation: With patient/family Time For Goal Achievement: 12/03/23 Potential to Achieve Goals: Good Progress towards PT goals: Progressing toward goals    Frequency    Min 3X/week      PT Plan      Co-evaluation              AM-PAC PT "6 Clicks" Mobility   Outcome Measure  Help needed  turning from your back to your side while in a flat bed without using bedrails?: None Help needed moving from lying on your back to sitting on the side of a flat bed without using bedrails?: None Help needed moving to and from a bed to a chair (including a wheelchair)?: A Little Help needed standing up from a chair using your arms (e.g., wheelchair or bedside chair)?: A Little Help needed to walk in hospital room?: A Little Help needed climbing 3-5 steps with a railing? : A Little 6 Click Score: 20    End of Session Equipment Utilized During Treatment: Gait belt Activity Tolerance: Patient tolerated treatment well Patient left: in chair;with call bell/phone within reach;with chair alarm set Nurse Communication: Mobility status PT Visit Diagnosis: Unsteadiness on feet (R26.81);Muscle weakness (generalized) (M62.81);Other symptoms and signs involving the nervous system (R29.898);Difficulty in walking, not elsewhere classified (R26.2);Hemiplegia and hemiparesis Hemiplegia - Right/Left: Right Hemiplegia - dominant/non-dominant: Dominant Hemiplegia - caused by: Nontraumatic SAH     Time: 9604-5409 PT Time Calculation (min) (ACUTE ONLY): 16 min  Charges:    $Gait Training: 8-22 mins PT General Charges $$ ACUTE PT VISIT: 1 Visit                     Kaj Vasil, PT  Acute Rehab Dept ([facility]) [phone]  11/22/2023    [facility] 11/22/2023, 11:42 AM

## 2023-11-22 NOTE — Progress Notes (Unsigned)
 [facility] OFFICE PROGRESS NOTE  Brian Stabile, MD [address] [place] [address]  DIAGNOSIS:  Metastatic non-small cell lung cancer initially diagnosed as stage IIIB (T1c, N3, M0) non-small cell lung cancer, adenocarcinoma presented with right upper lobe lung mass in addition to mediastinal and bilateral supraclavicular lymphadenopathy diagnosed in January 2020.  He had evidence of disease recurrence in June 2021 with adenopathy in the subcarinal, level 3 left neck lymph node, and lymph node at the curious of the diaphragm and in the upper abdomen.   PD-L1: 10%   Guardant 360 molecular studies showed no actionable mutation   Foundation One Testing:   Biomarker Findings Microsatellite status - MS-Stable Tumor Mutational Burden - 4 Muts/Mb Genomic Findings For a complete list of the genes assayed, please refer to the Appendix. ALK EML4-ALK fusion (Variant 2) SMARCB1 R377C CTNNB1 S45P CDKN2A/B CDKN2B loss, CDKN2A loss CXCR4 W106* TP53 splice site 672G>A 7 Disease relevant genes with no reportable alterations: BRAF, EGFR, ERBB2, KRAS, MET, RET, ROS1  PRIOR THERAPY: 1) Concurrent chemoradiation with weekly carboplatin  for AUC of 2 and paclitaxel  45 mg/M2.  Status post 6 cycles.  Last dose was given on October 10, 2018 with stable disease. 2) Radiation treatment to the enlarging right cervical lymph nodes under the care of Dr. Eloise Hake. First treatment 03/06/2019. Last treatment scheduled on 04/13/2019 3) Consolidation treatment with immunotherapy with Imfinzi  10 mg/KG every 2 weeks.  First dose November 17, 2018.  Status post 26 cycles. 4) Systemic chemotherapy with carboplatin  for an AUC of 5, Alimta  500 mg/m2, and Keytruda  200 mg IV every 3 weeks. First dose expected on 02/14/20. Status post 1 cycle.  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 5) SBRT to the left lower lobe lung  nodule. 6) [name] 600 mg p.o. twice daily.  He started the first dose on March 08, 2020.   Status post 44 months of treatment.  Discontinued in March 2025 due to disease progression 7) [name] 100 mg daily.  Discontinued in April 2025 due to severe intolerance due to mental status changes/confusion  8) Radiation to the bilateral cervical lymph nodes under the care of Dr. Jeryl Moris last day expected on 11/17/2023   CURRENT THERAPY: referral to [facility] for second opinion  INTERVAL HISTORY: Brian [name] 63 y.o. male returns to the clinic today for a follow-up visit accompanied by his wife.  The patient was last seen in the clinic 2 weeks ago.  The patient was found to have evidence of disease progression in March 2025.  Therefore, Dr. Marguerita Shih discontinued his treatment with [name]  and he was started on [name] .  He had nausea and vomiting after 1 dose and he discontinued his medication for 2 weeks.  At his last appointment he was re-started on treatment but unfortunately presented to the hospital secondary to mental status changes with hallucinations from his [name] .  He was also treated for pneumonia.  He was discharged a few days ago on 11/22/2023.  His wife came into the clinic to submit disability paperwork as well.   He was discharged in the hospital on supplemental oxygen .  He is having some trouble with the oxygen  supply company and they are expected to come out to the house today due to a malfunctioning oxygen  canister.  He is scheduled to see Dr. Diania Fortes from pulmonary medicine next week.  The patient is also wondering if  he would qualify for smaller oxygen  tank.    He was recently hospitalized due to mental status changes attributed to a medication, which have since resolved. During the hospitalization, he was treated for pneumonia and had fluid drained from his right lung twice, with 820 ml and 700 ml removed respectively. His most recent thoracentesis was 2 days  ago on 11/22/23.  He is going to talk to Dr. Diania Fortes next week about his opinion on a Pleurx catheter.   Since discharge, his breathing is 'okay,' though stress from the oxygen  supply issue affects it. He experiences coughing if he talks too much but denies any blood in his cough. He describes his cough as the same as when he left the hospital. No chest tightness since last Thursday, and no rashes or headaches.  He missed a brain MRI scheduled for March, but a CT of the head was performed during his hospital stay. He has had issues lying down for imaging in the past. He recalls having a reaction to a medication in the hospital, which was attempted to be reduced after an initial reaction.  He wants to return to work, stating 'my body's ready to quit, but my mind's not.' He inquires about portable oxygen  tanks, noting they would be beneficial for mobility and returning to work.  He is here today for evaluation and for a more detailed discussion about his current condition and treatment options.   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  MEDICATIONS:  No current facility-administered medications for this visit.   Current Outpatient Medications  Medication Sig Dispense Refill   amLODipine  (NORVASC ) 5 MG tablet Take 0.5  tablets (2.5 mg total) by mouth in the morning. 30 tablet 2   [START ON 11/23/2023] fluticasone  (FLONASE ) 50 MCG/ACT nasal spray Place 1 spray into both nostrils daily. 1 mL 2   mirtazapine  (REMERON ) 15 MG tablet TAKE 1 TABLET BY MOUTH EVERYDAY AT BEDTIME 90 tablet 1   Facility-Administered Medications Ordered in Other Visits  Medication Dose Route Frequency Provider Last Rate Last Admin   acetaminophen  (TYLENOL ) tablet 650 mg  650 mg Oral Q6H PRN Bennie Brave, MD   650 mg at 11/17/23 1842   Or   acetaminophen  (TYLENOL ) suppository 650 mg  650 mg Rectal Q6H PRN Bennie Brave, MD       albuterol  (PROVENTIL ) (2.5 MG/3ML) 0.083% nebulizer solution 2.5 mg  2.5 mg Inhalation Q4H PRN Bennie Brave, MD   2.5 mg at 11/16/23 2137   budeson-glycopyrrolate -formoterol  (BREZTRI ) 160-9-4.8 MCG/ACT inhaler 2 puff  2 puff Inhalation BID Bennie Brave, MD   2 puff at 11/22/23 1025   cefTRIAXone  (ROCEPHIN ) 2 g in sodium chloride  0.9 % 100 mL IVPB  2 g Intravenous Q24H Bennie Brave,  MD 200 mL/hr at 11/22/23 1025 2 g at 11/22/23 1025   enoxaparin  (LOVENOX ) injection 40 mg  40 mg Subcutaneous Q24H Bennie Brave, MD   40 mg at 11/22/23 0948   feeding supplement (ENSURE ENLIVE / ENSURE PLUS) liquid 237 mL  237 mL Oral BID BM Barbee Lew, MD   237 mL at 11/22/23 0955   fluticasone  (FLONASE ) 50 MCG/ACT nasal spray 1 spray  1 spray Each Nare Daily Barbee Lew, MD   1 spray at 11/22/23 1610   gadobutrol  (GADAVIST ) 1 MMOL/ML injection 7 mL  7 mL Intravenous Once PRN Pearlene Bouchard, PA-C       haloperidol  lactate (HALDOL ) injection 2 mg  2 mg Intravenous Q6H PRN Chavez, Abigail, NP   2 mg at 11/20/23 1330   levothyroxine  (SYNTHROID ) tablet 100 mcg  100 mcg Oral QAC breakfast Bennie Brave, MD   100 mcg at 11/22/23 9604   Oral care mouth rinse  15 mL Mouth Rinse 4 times per day Bennie Brave, MD   15 mL at 11/22/23 1149   Oral care mouth rinse  15 mL Mouth Rinse PRN Bennie Brave, MD       polyethylene glycol (MIRALAX  /  GLYCOLAX ) packet 17 g  17 g Oral Daily PRN Bennie Brave, MD       sodium chloride  flush (NS) 0.9 % injection 3 mL  3 mL Intravenous Q12H Bennie Brave, MD   3 mL at 11/22/23 1047    SURGICAL HISTORY:  Past Surgical History:  Procedure Laterality Date   COLONOSCOPY     FEMUR FRACTURE SURGERY Left 1969   INGUINAL HERNIA REPAIR Right 06/24/2020   Procedure: RIGHT OPEN INGUINAL HERNIA REPAIR WITH MESH;  Surgeon: Kinsinger, Alphonso Aschoff, MD;  Location: [facility];  Service: General;  Laterality: Right;   IR IVC FILTER PLMT / S&I Dan Dun GUID/MOD SED  03/04/2020   IR IVC FILTER RETRIEVAL / S&I Dan Dun GUID/MOD SED  08/15/2020   IR RADIOLOGIST EVAL & MGMT  08/06/2020   SPINAL CORD STIMULATOR INSERTION N/A 10/05/2023   Procedure: CERVICAL SPINAL CORD STIMULATOR PLACEMENT;  Surgeon: Carroll Clamp, MD;  Location: [facility] ORS;  Service: Neurosurgery;  Laterality: N/A;    REVIEW OF SYSTEMS:   Review of Systems  Constitutional: Positive for fatigue, decreased appetite, and weight loss.  Negative for chills and fever.  HENT: Positive for hoarseness. Negative for mouth sores, nosebleeds, sore throat and trouble swallowing.   Eyes: Negative for eye problems and icterus.  Respiratory: Positive for occasional dyspnea on exertion and intermittent cough. Negative for hemoptysis and wheezing.   Cardiovascular: Negative for chest pain and leg swelling.  Gastrointestinal: Constipation recently.  Nausea and vomiting with [name]  negative for abdominal pain and diarrhea.  Genitourinary: Negative for bladder incontinence, difficulty urinating, dysuria, frequency and hematuria.   Musculoskeletal: Negative for back pain, gait problem, neck pain and neck stiffness.  Skin: Negative for itching and rash.  Neurological: Occasional headaches. Negative for dizziness, extremity weakness, gait problem, light-headedness and seizures.  Hematological: Negative for adenopathy. Does not bruise/bleed easily.  Psychiatric/Behavioral:  Negative for confusion, depression and sleep disturbance. The patient is not nervous/anxious.    PHYSICAL EXAMINATION:  There were no vitals taken for this visit.  ECOG PERFORMANCE STATUS: 1  Physical Exam  Constitutional: Oriented to person, place, and time and well-developed, well-nourished, and in no distress.  HENT:  Head: Normocephalic and atraumatic.  Mouth/Throat: Positive for hoarseness. Oropharynx is clear and moist. No oropharyngeal exudate.  Eyes: Conjunctivae are normal. Right eye exhibits no discharge. Left eye exhibits no discharge. No scleral icterus.  Neck: Normal range of motion. Neck supple.  Cardiovascular: Normal rate, regular rhythm, normal heart sounds and intact distal pulses.   Pulmonary/Chest: Effort normal. Decreased breath sounds in right lower lung. No respiratory distress. No wheezes. No rales.  Abdominal: Soft. Bowel sounds are normal. Exhibits no distension and no mass. There is no tenderness.  Musculoskeletal: Flaccid right upper extremity. Exhibits no edema.  Lymphadenopathy:    Bilateral cervical lymphadenopathy.  Neurological: Alert and oriented to person, place, and time. Exhibits normal muscle tone. Gait normal. Coordination normal.  Skin: Skin is warm and dry. No rash noted. Not diaphoretic. No erythema. No pallor.  Psychiatric: Mood, memory and judgment normal.  Vitals reviewed.    LABORATORY DATA: Lab Results  Component Value Date   WBC 15.4 (H) 11/21/2023   HGB 9.9 (L) 11/21/2023   HCT 31.4 (L) 11/21/2023   MCV 92.9 11/21/2023   PLT 449 (H) 11/21/2023      Chemistry      Component Value Date/Time   NA 136 11/21/2023 0407   K 3.6 11/21/2023 0407   CL 96 (L) 11/21/2023 0407   CO2 29 11/21/2023 0407   BUN 9 11/21/2023 0407   CREATININE 0.49 (L) 11/21/2023 0407   CREATININE 0.77 11/03/2023 1411      Component Value Date/Time   CALCIUM  8.9 11/21/2023 0407   ALKPHOS 91 11/21/2023 0407   AST 20 11/21/2023 0407   AST 22 11/03/2023  1411   ALT 19 11/21/2023 0407   ALT 20 11/03/2023 1411   BILITOT 0.7 11/21/2023 0407   BILITOT 1.2 11/03/2023 1411       RADIOGRAPHIC STUDIES:  DG Chest 1 View Result Date: 11/22/2023 CLINICAL DATA:  Hypoxia EXAM: CHEST  1 VIEW COMPARISON:  November 17, 2023 FINDINGS: Worsening moderate right pleural effusion with right basilar atelectasis. Subsegmental atelectasis in the left lung base. No pneumothorax. Ventriculoperitoneal shunt catheter tubing along the left hemithorax. No cardiomegaly. Bilateral AC joint osteoarthritis. IMPRESSION: Worsening moderate right pleural effusion with right basilar atelectasis. Electronically Signed   By: Rance Burrows M.D.   On: 11/22/2023 12:34   VAS US  LOWER EXTREMITY VENOUS (DVT) Result Date: 11/17/2023  Lower Venous DVT Study Patient Name:  TRENTIN KNAPPENBERGER  Date of Exam:   11/17/2023 Medical Rec #: [id number]         Accession #:    [id number] Date of Birth: 01-11-1961        Patient Gender: M Patient Age:   48 years Exam Location:  [facility] Procedure:      VAS US  LOWER EXTREMITY VENOUS (DVT) Referring Phys: [facility] GOEL --------------------------------------------------------------------------------  Indications: Swelling, Edema, and SOB. Other Indications: H/O DVT and SVT, throat cancer. Comparison Study: Previous study on 1.4.2022. Performing Technologist: Ria Chad  Examination Guidelines: A complete evaluation includes B-mode imaging, spectral Doppler, color Doppler, and power Doppler as needed of all accessible portions of each vessel. Bilateral testing is considered an integral part of a complete examination. Limited examinations for reoccurring indications may be performed as noted. The reflux portion of the exam is performed with the patient in reverse Trendelenburg.  +---------+---------------+---------+-----------+----------+--------------+ RIGHT    CompressibilityPhasicitySpontaneityPropertiesThrombus Aging  +---------+---------------+---------+-----------+----------+--------------+ CFV      Full           Yes      Yes                                 +---------+---------------+---------+-----------+----------+--------------+  SFJ      Full           Yes      Yes                                 +---------+---------------+---------+-----------+----------+--------------+ FV Prox  Full                                                        +---------+---------------+---------+-----------+----------+--------------+ FV Mid   Full                                                        +---------+---------------+---------+-----------+----------+--------------+ FV DistalFull                                                        +---------+---------------+---------+-----------+----------+--------------+ PFV      Full                                                        +---------+---------------+---------+-----------+----------+--------------+ POP      Full           Yes      Yes                                 +---------+---------------+---------+-----------+----------+--------------+ PTV      Full                                                        +---------+---------------+---------+-----------+----------+--------------+ PERO     Full                                                        +---------+---------------+---------+-----------+----------+--------------+   +---------+---------------+---------+-----------+----------+--------------+ LEFT     CompressibilityPhasicitySpontaneityPropertiesThrombus Aging +---------+---------------+---------+-----------+----------+--------------+ CFV      Full           Yes      Yes                                 +---------+---------------+---------+-----------+----------+--------------+ SFJ      Full           Yes      Yes                                  +---------+---------------+---------+-----------+----------+--------------+  FV Prox  Full                                                        +---------+---------------+---------+-----------+----------+--------------+ FV Mid   Full                                                        +---------+---------------+---------+-----------+----------+--------------+ FV DistalFull                                                        +---------+---------------+---------+-----------+----------+--------------+ PFV      Full                                                        +---------+---------------+---------+-----------+----------+--------------+ POP      Full           Yes      Yes                                 +---------+---------------+---------+-----------+----------+--------------+ PTV      Full                                                        +---------+---------------+---------+-----------+----------+--------------+ PERO     Full                                                        +---------+---------------+---------+-----------+----------+--------------+ GSV      Partial        No       No                                  +---------+---------------+---------+-----------+----------+--------------+ A small segment of the GSV at the middle calf level is thrombosed and chronic.    Summary: RIGHT: - There is no evidence of deep vein thrombosis in the lower extremity.  - No cystic structure found in the popliteal fossa.   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  - There is no evidence of deep vein thrombosis in the lower extremity.  - No cystic structure found in the popliteal fossa.   *See table(s) above for measurements and observations. Electronically signed by Irvin Mantel on 11/17/2023 at 8:04:36 PM.    Final    DG Chest 1 View Result Date: 11/17/2023 CLINICAL DATA:  [id number] S/P thoracentesis [id number] EXAM: PORTABLE CHEST 1 VIEW COMPARISON:  Chest XR, 11/15/2023.  CT chest, 08/13/2023. FINDINGS: Support lines: Spinal cord stimulator, with leads extending outside field of view. Cardiomediastinal silhouette is within normal limits. The LEFT lung is well inflated. Improved aeration of the RIGHT lung post thoracentesis with small volume residual pleural effusion. RIGHT perihilar pulmonary opacity with trace residual fluid layering along the minor fissure. No pneumothorax. RIGHT AC joint degenerative change. No acute osseous abnormality. IMPRESSION: Improved aeration of the RIGHT lung post thoracentesis with small volume residual pleural effusion. No pneumothorax. Electronically Signed   By: Art Largo M.D.   On: 11/17/2023 16:54   US  THORACENTESIS ASP PLEURAL SPACE W/IMG GUIDE Result Date: 11/17/2023 INDICATION: Patient with history of non-small cell lung cancer of right lung, recurrent pleural effusion. IR consulted for diagnostic and therapeutic right thoracentesis. EXAM: ULTRASOUND GUIDED DIAGNOSTIC AND THERAPEUTIC RIGHT THORACENTESIS MEDICATIONS: 10 mL 1% lidocaine  COMPLICATIONS: None immediate. PROCEDURE: An ultrasound guided thoracentesis was thoroughly discussed with the patient and questions answered. The benefits, risks, alternatives and complications were also discussed. The patient understands and wishes to proceed with the procedure. Written consent was obtained. Ultrasound was performed to localize and mark an adequate pocket of fluid in the right chest. The area was then prepped and draped in the normal sterile fashion. 1% Lidocaine  was used for local anesthesia. Under ultrasound guidance a 6 Fr Safe-T-Centesis catheter was introduced. Thoracentesis was performed. The catheter was removed and a dressing applied. FINDINGS: A total of approximately 820 mL of yellow fluid was removed. Samples were sent to the laboratory as requested by the clinical team. IMPRESSION:  Successful ultrasound guided diagnostic and therapeutic RIGHT thoracentesis yielding 820 mL of pleural fluid. Performed by: Wyatt Pommier, PA-C Electronically Signed   By: Art Largo M.D.   On: 11/17/2023 16:41   DG Chest 1 View Result Date: 11/16/2023 CLINICAL DATA:  confusion, cancer history EXAM: CHEST  1 VIEW COMPARISON:  08/27/2023 FINDINGS: Interval development of moderate right pleural effusion, with atelectasis/consolidation the right lung base. There is some linear right suprahilar scarring or atelectasis. Left lung clear. Heart size and mediastinal contours are within normal limits. Small caliber catheter placement through the left neck, chest, and upper abdomen. Visualized bones unremarkable. IMPRESSION: Moderate right pleural effusion with right basilar atelectasis/consolidation. Electronically Signed   By: Nicoletta Barrier M.D.   On: 11/16/2023 17:56   CT HEAD WO CONTRAST Result Date: 11/16/2023 CLINICAL DATA:  Mental status change of unknown cause. Head and neck cancer being treated with radiation. EXAM: CT HEAD WITHOUT CONTRAST TECHNIQUE: Contiguous axial images were obtained from the base of the skull through the vertex without intravenous contrast. RADIATION DOSE REDUCTION: This exam was performed according to the departmental dose-optimization program which includes automated exposure control, adjustment of the mA and/or kV according to patient size and/or use of iterative reconstruction technique. COMPARISON:  10/15/2022 FINDINGS: Brain: The brain shows a normal appearance without evidence of malformation, atrophy, old or acute small or large vessel infarction, mass lesion, hemorrhage, hydrocephalus or extra-axial collection. Vascular: No hyperdense vessel. No evidence of atherosclerotic calcification. Skull: Normal.  No traumatic finding.  No focal bone lesion. Sinuses/Orbits: Sinuses are clear. Orbits appear normal. Mastoids are clear. Other: Upper cervical neurostimulator in place.  IMPRESSION: Normal head CT. Upper cervical neurostimulator in place. Electronically Signed   By: Bettylou Brunner M.D.   On: 11/16/2023 16:21     ASSESSMENT/PLAN:  This is a very pleasant 63 year old Caucasian male with  recurrent lung cancer initially diagnosed with stage IIIb non-small cell lung cancer, adenocarcinoma.  He presented with a right upper lobe lung mass in addition to mediastinal and bilateral supraclavicular lymphadenopathy.  He was diagnosed in January 2020.  His PDL 1 expression is 10% and he has no actionable mutations.    He underwent a course of concurrent chemoradiation with weekly carboplatin  and paclitaxel . He is status post 6 cycles.  He tolerated well except for fatigue and odynophagia.    He completed his 26 cycles of consolidation immunotherapy with Imfinzi  10 mg/kg IV every 2 weeks. He tolerated it well without any adverse side effects except has a very mild skin rash over his right cervical area. He completed radiation to the enlarging right cervical lymph nodes. His last radiation treatment was on 04/13/2019.   He received 1 cycle of systemic chemotherapy with carboplatin , Alimta  and Keytruda . Repeat tissue biopsy from the left supraclavicular lymph nodes and molecular studies showed that the patient has positive ALK gene translocation. His previous molecular studies by Guardant 360 was negative. Dr. Marguerita Shih discontinue his systemic chemotherapy for now because of the new findings on the molecular studies.    The patient has been on treatment with [name]  600 mg p.o. twice daily.  He is status post 43 months of treatment.    He unfortunately was recently found to have evidence of disease progression and his treatment was switched to 100 mg of [name]  p.o. daily.  He started this on 10/20/2023.    He completed palliative radiation to the cervical lymph nodes on 11/17/23.  Patient had significant intolerance to [name]  and he was recently hospitalized with  hallucinations. He was discharged on 11/22/23.   The patient was seen with Dr. Marguerita Shih today.  We discussed his options.  Lung cancer with pleural effusion Recurrent pleural effusions requiring frequent drainage. Considering pleural catheter placement to manage effusions and reduce hospital visits. He prefers catheter placement.  He is scheduled to see Dr. Diania Fortes from pulmonary medicine next week. - Consult pulmonologist for pleural catheter placement. - Consider CT surgery consult for catheter placement if pulmonology is unable to perform.  Oxygen  dependency Oxygen  dependency due to lung cancer and pleural effusion. Temporary resolution of issues with oxygen  supply company. Considering portable oxygen  tank for improved mobility. Pulmonologist to assess additional equipment needs. - Contact oxygen  supply company for portable oxygen  tank assessment.   Adverse reaction to medication Adverse reaction to lorlatinib  causing neuropsychiatric symptoms. Considering alternative treatments including brigatinib (however Dr. Marguerita Shih discussed concerns with respiratory side effects), reduced dose lorlatinib , and chemotherapy. Referral to [facility] for clinical trials or alternative therapies. He is interested in virtual management options due to travel difficulties. - Discontinue lorlatinib . Patient not interested in resuming this, even with reduced dose. - Refer to [facility] for potential clinical trials or alternative targeted therapies. Will place urgent referral today.  - Consider brigatinib with caution due to respiratory risk.  - Discuss chemotherapy as an option if targeted therapy is not feasible.  Will follow-up with him in 2 weeks after he is seen pulmonary medicine and had a second opinion at [facility].    The patient's wife dropped off FMLA paperwork today.   The patient was advised to call immediately if she has any concerning symptoms in the interval. The patient voices understanding of current disease  status and treatment options and is in agreement with the current care plan. All questions were answered. The patient knows to call the clinic with any problems, questions  or concerns. We can certainly see the patient much sooner if necessary   No orders of the defined types were placed in this encounter.    Elynor Kallenberger L Makina Skow, PA-C 11/22/23  ADDENDUM: Hematology/Oncology Attending: I had a face-to-face encounter with the patient today.  I reviewed his records, lab, recent scans and recommended his care plan.  This is a very pleasant 63 years old white male with metastatic non-small cell lung cancer that was initially diagnosed as stage IIIb in January 2020 with PD-L1 expression of 10% and positive ALK gene translocation.  He is status post several treatment including course of concurrent chemoradiation followed by consolidation immunotherapy followed by reirradiation to enlarged with right cervical lymph nodes then the patient was treated with alectinib 600 mg twice daily for around 44 months before it was discontinued in March 2025 secondary to disease progression.  He started treatment with [name]  100 mg p.o. twice daily he took it only for few weeks before it was discontinued in April 2025 secondary to intolerance with neurocognitive disorders, hallucination as well as significant fatigue and weakness.  The patient was admitted to the hospital recently with respiratory failure as well as hallucination and confusion.  He underwent ultrasound-guided right thoracentesis for recurrent right pleural effusion twice recently with drainage of over 800 mL the first time in around 700 mL the second time with few days in between. I had a nice discussion with the patient and his wife today about his condition and future treatment options.  I discussed with the patient referral to pulmonary medicine or cardiothoracic surgery for consideration of Pleurx catheter placement for drainage of the recurrent  right pleural effusion.  Also discussed with the patient his systemic therapy options including treatment with [name]  with reduced dose 75 mg p.o. daily but the patient and his wife were strongly against this option especially with his most recent experience.  We also discussed with the patient the treatment with [name] (Alungbrig) but there is always a concern about the initial respiratory failure and the patient is currently on home oxygen  and had significant respiratory issues and he is very concerned about consideration of this treatment.  The third option we discussed was starting the patient on systemic chemoimmunotherapy with carboplatin , Alimta  and Keytruda  every 3 weeks.  He was also given the option seeking second opinion with Dr. Baker Bon at [facility] who has seen him in the past to see if there is any other treatment options.  The option of palliative care was also discussed with the patient. He is interested in speaking to Dr. Baker Bon and we will arrange for him to have an appointment with him. He will also discussed with pulmonary medicine consideration of the Pleurx catheter with his upcoming visit. We will see the patient back for follow-up visit in around 2 weeks for evaluation and more discussion of his treatment options after his visit at [facility] and also with the pulmonologist. The patient was advised to call immediately if he has any other concerning symptoms in the interval. The total time spent in the appointment was 30 minutes. Disclaimer: This note was dictated with voice recognition software. Similar sounding words can inadvertently be transcribed and may be missed upon review. Aurelio Blower, MD

## 2023-11-22 NOTE — Radiation Completion Notes (Signed)
  Radiation Oncology         [phone] ________________________________  Name: Brian [name] MRN: [id number]  Date of Service: 11/19/2023  DOB: 1960/08/31  End of Treatment Note     Diagnosis: Progressive metastatic non-small cell lung cancer, adenocarcinoma originally in the right lung with prior concern for brain metastases, now with local cervical  adenopathy   Intent: Palliative     ==========DELIVERED PLANS==========  First Treatment Date: 2023-11-04 Last Treatment Date: 2023-11-19   Plan Name: HN Site: Neck Technique: IMRT Mode: Photon Dose Per Fraction: 3 Gy Prescribed Dose (Delivered / Prescribed): 30 Gy / 30 Gy Prescribed Fxs (Delivered / Prescribed): 10 / 10     ==========ON TREATMENT VISIT DATES========== 2023-11-05, 2023-11-12  See weekly On Treatment Notes in Epic for details in the Media tab (listed as Progress notes on the On Treatment Visit Dates listed above). The patient tolerated radiation. He developed fatigue and did have to medicate daily to tolerate his positioning on the treatment table but was able to complete therapy.  The patient will receive a call in about one month from the radiation oncology department. He will continue follow up with Dr. Marguerita Shih as well.      Shelvia Dick, PAC

## 2023-11-22 NOTE — Evaluation (Signed)
 Occupational Therapy Evaluation Patient Details Name: Brian [name] MRN: [id number] DOB: [birth year] Today's Date: 11/22/2023   History of Present Illness   63 year old gentleman admitted 11/16/23 with acute hypoxic respiratory failure secondary to community-acquired pneumonia, right-sided pleural effusion, S/P thoracentesis. PMH.:Lung cancer, brain and [place] mets, HTN, SAH, DVT, IVCfilter, radiation induced RUE plexopathy, Pleaural effusion     Clinical Impressions PTA, patient was living at home with wife, working from home and reports driving. Currently, patient presents with deficits outlined below (see OT problem list for details) including R hemiplegia, L hemiparesis, decreased balance, activity tolerance and coordination now on 2 ltrs of O2 via [place]. During evaluation session, OT issued and trained in simple L sided adapted devices including a universal cuff for grooming and simple self feeding, a bendy long straw to access liquid to mouth with increased ease and L wash mitt for bathing. Patient will require continued Acute level hospital based OT services to maximize function and recommending HHOT with 24 hr support and assistance from family at home.      If plan is discharge home, recommend the following:   A little help with walking and/or transfers;A lot of help with bathing/dressing/bathroom;Assistance with cooking/housework;Assistance with feeding;Direct supervision/assist for medications management;Direct supervision/assist for financial management;Assist for transportation;Help with stairs or ramp for entrance     Functional Status Assessment   Patient has had a recent decline in their functional status and demonstrates the ability to make significant improvements in function in a reasonable and predictable amount of time.     Equipment Recommendations   None recommended by OT      Precautions/Restrictions   Precautions Precautions: Fall Precaution/Restrictions  Comments: RUE plegic/flaccid Required Braces or Orthoses: Sling;Other Brace Other Brace: glove and sling Restrictions Weight Bearing Restrictions Per Provider Order: No     Mobility Bed Mobility Overal bed mobility:  (patient in recliner this session)                  Transfers Overall transfer level: Needs assistance Equipment used: 1 person hand held assist, None Transfers: Sit to/from Stand, Bed to chair/wheelchair/BSC Sit to Stand: Min assist, Contact guard assist     Step pivot transfers: Contact guard assist     General transfer comment:  (min cues to manage R UE during sit to stand from recliner and toilet)      Balance Overall balance assessment: Needs assistance Sitting-balance support: No upper extremity supported, Feet supported Sitting balance-Leahy Scale: Fair     Standing balance support: No upper extremity supported Standing balance-Leahy Scale: Fair                             ADL either performed or assessed with clinical judgement   ADL Overall ADL's : Needs assistance/impaired Eating/Feeding: Maximal assistance;Sitting (issued and trained this session with bendy long straw and iniversal cuff for utensil use with simple scoop foods with L hand)   Grooming: Wash/dry hands;Wash/dry face;Oral care;Moderate assistance;With adaptive equipment;Sitting (training for tooothbrush in L universal cuff)   Upper Body Bathing: Maximal assistance;Sitting (trained in bath mitt this session for L hand)   Lower Body Bathing: Maximal assistance;With adaptive equipment;Sitting/lateral leans;Sit to/from stand   Upper Body Dressing : Maximal assistance   Lower Body Dressing: Maximal assistance;Sitting/lateral leans;Sit to/from stand   Toilet Transfer: Contact guard assist (assist with O2 tubing management)   Toileting- Clothing Manipulation and Hygiene: Set up;Sitting/lateral lean  Functional mobility during ADLs: Contact guard assist (L  hand held assist) General ADL Comments: see comments for AE issued and trained due to R hemiplegia and L hand weakness and coord deficits     Vision Baseline Vision/History: 1 Wears glasses;0 No visual deficits Ability to See in Adequate Light: 0 Adequate Patient Visual Report: Eye fatigue/eye pain/headache Vision Assessment?: No apparent visual deficits;Wears glasses for reading;Wears glasses for driving     Perception Perception: Within Functional Limits       Praxis Praxis: WFL       Pertinent Vitals/Pain Pain Assessment Pain Assessment: No/denies pain     Extremity/Trunk Assessment Upper Extremity Assessment Upper Extremity Assessment: Right hand dominant;RUE deficits/detail RUE Deficits / Details: flaccid, subluxed shoulder, RUE: Subluxation noted (uses sling at times and R Ue pillow support) RUE Sensation: decreased light touch RUE Coordination: decreased fine motor;decreased gross motor LUE Deficits / Details: limited finger movement, can use thumb to  type on ipad, diminished functional grasp, issued and trained in simple AD for self feeding, grooming and bathing this session   Lower Extremity Assessment Lower Extremity Assessment: Generalized weakness   Cervical / Trunk Assessment Cervical / Trunk Assessment: Kyphotic   Communication Communication Communication: No apparent difficulties   Cognition Arousal: Alert Behavior During Therapy: WFL for tasks assessed/performed                                 Following commands: Intact       Cueing  General Comments   Cueing Techniques: Verbal cues  mild edema R hand, reinforced education to maintain elevation, sats > 92 % on O2 2 ltrs           Home Living Family/patient expects to be discharged to:: Private residence Living Arrangements: Spouse/significant other Available Help at Discharge: Available 24 hours/day;Family Type of Home: House Home Access: Stairs to enter [employer] of Steps: 2 +3  holds door   Home Layout: Two level Alternate Level Stairs-Number of Steps: 6+6 Alternate Level Stairs-Rails: Right Bathroom Shower/Tub: Walk-in shower;Door   [employer] Toilet: Handicapped height Bathroom Accessibility: Yes How Accessible: Accessible via wheelchair;Accessible via walker Home Equipment: Shower seat   Additional Comments: family setting up 1st floor bedroom and has a 1st floor full bath      Prior Functioning/Environment Prior Level of Function : Independent/Modified Independent;Working/employed;Driving             Mobility Comments: per patient has  been driving and working ADLs Comments: assisted by wife, wife assists with feeding    OT Problem List: Decreased strength;Decreased activity tolerance;Impaired balance (sitting and/or standing);Impaired vision/perception;Decreased coordination;Decreased knowledge of use of DME or AE;Decreased knowledge of precautions;Cardiopulmonary status limiting activity;Impaired sensation;Impaired tone;Impaired UE functional use;Increased edema   OT Treatment/Interventions: Self-care/ADL training;Therapeutic exercise;Neuromuscular education;Energy conservation;DME and/or AE instruction;Manual therapy;Therapeutic activities;Patient/family education;Balance training      OT Goals(Current goals can be found in the care plan section)   Acute Rehab OT Goals Patient Stated Goal: to be able to do more for myself OT Goal Formulation: With patient Time For Goal Achievement: 12/06/23 Potential to Achieve Goals: Fair ADL Goals Pt Will Perform Eating: with adaptive utensils;sitting;with caregiver independent in assisting;with set-up Pt Will Perform Grooming: with set-up;with adaptive equipment;sitting Pt Will Perform Upper Body Bathing: with adaptive equipment;with min assist;sitting Pt Will Perform Lower Body Bathing: with mod assist;with adaptive equipment;sitting/lateral leans;sit to/from stand Pt Will  Perform Upper Body Dressing: with mod  assist;sitting;with caregiver independent in assisting Pt Will Transfer to Toilet: with supervision;ambulating   OT Frequency:  Min 2X/week       AM-PAC OT "6 Clicks" Daily Activity     Outcome Measure Help from another person eating meals?: A Lot Help from another person taking care of personal grooming?: A Lot Help from another person toileting, which includes using toliet, bedpan, or urinal?: A Lot Help from another person bathing (including washing, rinsing, drying)?: A Little Help from another person to put on and taking off regular upper body clothing?: A Lot Help from another person to put on and taking off regular lower body clothing?: A Lot 6 Click Score: 13   End of Session Equipment Utilized During Treatment: Gait belt;Oxygen  Nurse Communication: Mobility status;Other (comment) (flowsheet data entered for voiding)  Activity Tolerance: Patient limited by fatigue Patient left: in chair;with call bell/phone within reach;with chair alarm set  OT Visit Diagnosis: Unsteadiness on feet (R26.81);Muscle weakness (generalized) (M62.81);Feeding difficulties (R63.3);Other symptoms and signs involving the nervous system (R29.898);Hemiplegia and hemiparesis Hemiplegia - Right/Left: Right Hemiplegia - dominant/non-dominant: Dominant Hemiplegia - caused by: Unspecified                Time: 0981-1914 OT Time Calculation (min): 43 min Charges:  OT General Charges $OT Visit: 1 Visit OT Evaluation $OT Eval Moderate Complexity: 1 Mod OT Treatments $Self Care/Home Management : 23-37 mins Nikola Marone OT/L Acute Rehabilitation Department  [phone]  11/22/2023, 2:10 PM

## 2023-11-22 NOTE — Progress Notes (Signed)
 I have reviewed and concur with this student's documentation.   Sircharles Holzheimer Futures trader, RN 11/22/2023 5:54 PM

## 2023-11-22 NOTE — Discharge Summary (Signed)
 Physician Discharge Summary  Brian [name] WUJ:[id number] DOB: 07-Nov-1960 DOA: 11/16/2023  PCP: [name], MD  Admit date: 11/16/2023 Discharge date: 11/22/2023  Admitted From: home  Disposition:home Recommendations for Outpatient Follow-up:  Follow up with PCP in 1-2 weeks Please obtain BMP/CBC in one week 3  follow-up with Dr. Marguerita Shih  Home Health: Yes Equipment/Devices 2 L oxygen   Discharge Condition: Stable CODE STATUS: Full code Diet recommendation: Cardiac Brief/Interim Summary: :63 y.o. male with medical history significant of medical issues as listed below. Patient has chronic right upper extremity palsy.  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  Patient has had frequent right-sided thoracentesis to drain the fluid.  Patient admitted with increasing shortness of breath with exertion associated with cough congestion.  He notices oxygen  saturation was 87% on room air  At home Another concern for the patient has been increased confusion at home.  For example wife reports patient rushing downstairs and saying that his forearm had people inside that needed to be taken out.     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  With right pleural effusion He is status post thoracentesis 850 cc of fluid removed from his right .  Chest x-ray Prior to discharge showed reaccumulation of fluid on the right with worsening moderate right pleural effusion with right basilar atelectasis.  His oxygen  saturation dropped to 84% with ambulation on room air.  He will be discharged on 2 L of oxygen  at home.   #2 acute metabolic toxic encephalopathy -likely multifactorial secondary to #1 and side effect from his chemo [name] .  He became more  delirious and restless after getting Ativan  prior to getting MRI of the brain.  Will add Ativan  to the allergy list.   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   #6 hypokalemia repleted   Estimated body mass index is 23.6 kg/m as calculated from the following:   Height as of this encounter: 5\' 8"  (1.727 m).   Weight as of this encounter: 70.4 kg.  Discharge Instructions  Discharge Instructions     Diet - low sodium heart healthy   Complete by: As directed    For home use only DME oxygen    Complete by: As directed    Length of Need: Lifetime   Mode or (Route): Nasal cannula   Liters per Minute: 2   Frequency: Continuous (stationary and portable oxygen  unit needed)   Oxygen  conserving device: Yes   Oxygen  delivery system: Gas   Increase activity slowly   Complete by: As directed       Allergies as of 11/22/2023       Reactions   Lorazepam  Anxiety   Per MD, patient gets restless/delirious after Ativan  administration.        Medication List     STOP taking these medications    amitriptyline  25 MG tablet Commonly known as: ELAVIL    LORazepam  0.5 MG tablet Commonly known as: Ativan    lorlatinib  100 MG tablet Commonly known as: [name]    oxyCODONE  5 MG immediate release tablet Commonly known as: Roxicodone   TAKE these medications    acetaminophen  500 MG tablet Commonly known as: TYLENOL  Take 1,000 mg by mouth every 6 (six) hours as needed (for pain).   albuterol  108 (90 Base) MCG/ACT inhaler Commonly known as: VENTOLIN  HFA INHALE 2 PUFFS INTO THE LUNGS EVERY 4 (FOUR) HOURS AS NEEDED FOR WHEEZING OR SHORTNESS OF BREATH.   amLODipine  5 MG tablet Commonly known as: NORVASC  Take 0.5 tablets (2.5 mg total) by mouth in the morning. What changed: how much to take   atorvastatin  20 MG  tablet Commonly known as: LIPITOR Take 20 mg by mouth in the morning.   baclofen  10 MG tablet Commonly known as: LIORESAL  Take 10 mg by mouth at bedtime.   cyclobenzaprine  5 MG tablet Commonly known as: FLEXERIL  Take 1 tablet (5 mg total) by mouth 3 (three) times daily as needed for muscle spasms.   DULoxetine  60 MG capsule Commonly known as: Cymbalta  Take 1 capsule (60 mg total) by mouth daily.   fluticasone  50 MCG/ACT nasal spray Commonly known as: FLONASE  Place 1 spray into both nostrils daily. Start taking on: November 23, 2023   levothyroxine  100 MCG tablet Commonly known as: SYNTHROID  Take 100 mcg by mouth daily before breakfast.   MiraLax  17 GM/SCOOP powder Generic drug: polyethylene glycol powder Take 17 g by mouth daily as needed for mild constipation.   mirtazapine  15 MG tablet Commonly known as: REMERON  TAKE 1 TABLET BY MOUTH EVERYDAY AT BEDTIME What changed: See the new instructions.   mupirocin  ointment 2 % Commonly known as: BACTROBAN  Apply 1 Application topically See admin instructions. Apply to affected incision sites after a shower- morning and bedtime   ondansetron  8 MG tablet Commonly known as: ZOFRAN  Take 1 tablet (8 mg total) by mouth every 8 (eight) hours as needed for nausea or vomiting. What changed:  when to take this additional instructions   prochlorperazine  10 MG tablet Commonly known as: COMPAZINE  Take 1 tablet (10 mg total) by mouth every 6 (six) hours as needed. What changed:  when to take this additional instructions   Trelegy Ellipta  100-62.5-25 MCG/INH Aepb Generic drug: Fluticasone -Umeclidin-Vilant Inhale 1 puff into the lungs in the morning.   venlafaxine  37.5 MG tablet Commonly known as: EFFEXOR  Take 37.5 mg by mouth in the morning.               Durable Medical Equipment  (From admission, onward)           Start     Ordered   11/22/23 0000  For home use only DME oxygen        Question Answer Comment   Length of Need Lifetime   Mode or (Route) Nasal cannula   Liters per Minute 2   Frequency Continuous (stationary and portable oxygen  unit needed)   Oxygen  conserving device Yes   Oxygen  delivery system Gas      11/22/23 1338            Allergies  Allergen Reactions   Lorazepam  Anxiety    Per MD, patient gets restless/delirious after Ativan  administration.    Consultations:onc IR  Procedures/Studies: DG Chest 1 View Result Date: 11/22/2023 CLINICAL DATA:  Hypoxia EXAM: CHEST  1 VIEW COMPARISON:  November 17, 2023 FINDINGS: Worsening moderate right pleural effusion with right basilar atelectasis. Subsegmental atelectasis in the left lung base. No pneumothorax. Ventriculoperitoneal shunt catheter tubing along the left hemithorax. No cardiomegaly. Bilateral AC joint osteoarthritis. IMPRESSION: Worsening moderate right pleural effusion with right basilar atelectasis. Electronically Signed  By: Rance Burrows M.D.   On: 11/22/2023 12:34   VAS US  LOWER EXTREMITY VENOUS (DVT) Result Date: 11/17/2023  Lower Venous DVT Study Patient Name:  TRUNG WENZL  Date of Exam:   11/17/2023 Medical Rec #: [id number]         Accession #:    [id number] Date of Birth: 12-28-1960        Patient Gender: M Patient Age:   63 years Exam Location:  [facility] Procedure:      VAS US  LOWER EXTREMITY VENOUS (DVT) Referring Phys: [facility] GOEL --------------------------------------------------------------------------------  Indications: Swelling, Edema, and SOB. Other Indications: H/O DVT and SVT, throat cancer. Comparison Study: Previous study on 1.4.2022. Performing Technologist: Ria Chad  Examination Guidelines: A complete evaluation includes B-mode imaging, spectral Doppler, color Doppler, and power Doppler as needed of all accessible portions of each vessel. Bilateral testing is considered an integral part of a complete examination. Limited examinations for reoccurring indications may be  performed as noted. The reflux portion of the exam is performed with the patient in reverse Trendelenburg.  +---------+---------------+---------+-----------+----------+--------------+ RIGHT    CompressibilityPhasicitySpontaneityPropertiesThrombus Aging +---------+---------------+---------+-----------+----------+--------------+ CFV      Full           Yes      Yes                                 +---------+---------------+---------+-----------+----------+--------------+ SFJ      Full           Yes      Yes                                 +---------+---------------+---------+-----------+----------+--------------+ FV Prox  Full                                                        +---------+---------------+---------+-----------+----------+--------------+ FV Mid   Full                                                        +---------+---------------+---------+-----------+----------+--------------+ FV DistalFull                                                        +---------+---------------+---------+-----------+----------+--------------+ PFV      Full                                                        +---------+---------------+---------+-----------+----------+--------------+ POP      Full           Yes      Yes                                 +---------+---------------+---------+-----------+----------+--------------+  PTV      Full                                                        +---------+---------------+---------+-----------+----------+--------------+ PERO     Full                                                        +---------+---------------+---------+-----------+----------+--------------+   +---------+---------------+---------+-----------+----------+--------------+ LEFT     CompressibilityPhasicitySpontaneityPropertiesThrombus Aging +---------+---------------+---------+-----------+----------+--------------+ CFV      Full            Yes      Yes                                 +---------+---------------+---------+-----------+----------+--------------+ SFJ      Full           Yes      Yes                                 +---------+---------------+---------+-----------+----------+--------------+ FV Prox  Full                                                        +---------+---------------+---------+-----------+----------+--------------+ FV Mid   Full                                                        +---------+---------------+---------+-----------+----------+--------------+ FV DistalFull                                                        +---------+---------------+---------+-----------+----------+--------------+ PFV      Full                                                        +---------+---------------+---------+-----------+----------+--------------+ POP      Full           Yes      Yes                                 +---------+---------------+---------+-----------+----------+--------------+ PTV      Full                                                        +---------+---------------+---------+-----------+----------+--------------+  PERO     Full                                                        +---------+---------------+---------+-----------+----------+--------------+ GSV      Partial        No       No                                  +---------+---------------+---------+-----------+----------+--------------+ A small segment of the GSV at the middle calf level is thrombosed and chronic.    Summary: RIGHT: - There is no evidence of deep vein thrombosis in the lower extremity.  - No cystic structure found in the popliteal fossa.   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  - There is no evidence of deep vein thrombosis in the lower extremity.  - No cystic structure found in the  popliteal fossa.   *See table(s) above for measurements and observations. Electronically signed by Irvin Mantel on 11/17/2023 at 8:04:36 PM.    Final    DG Chest 1 View Result Date: 11/17/2023 CLINICAL DATA:  [id number] S/P thoracentesis [id number] EXAM: PORTABLE CHEST 1 VIEW COMPARISON:  Chest XR, 11/15/2023.  CT chest, 08/13/2023. FINDINGS: Support lines: Spinal cord stimulator, with leads extending outside field of view. Cardiomediastinal silhouette is within normal limits. The LEFT lung is well inflated. Improved aeration of the RIGHT lung post thoracentesis with small volume residual pleural effusion. RIGHT perihilar pulmonary opacity with trace residual fluid layering along the minor fissure. No pneumothorax. RIGHT AC joint degenerative change. No acute osseous abnormality. IMPRESSION: Improved aeration of the RIGHT lung post thoracentesis with small volume residual pleural effusion. No pneumothorax. Electronically Signed   By: Art Largo M.D.   On: 11/17/2023 16:54   US  THORACENTESIS ASP PLEURAL SPACE W/IMG GUIDE Result Date: 11/17/2023 INDICATION: Patient with history of non-small cell lung cancer of right lung, recurrent pleural effusion. IR consulted for diagnostic and therapeutic right thoracentesis. EXAM: ULTRASOUND GUIDED DIAGNOSTIC AND THERAPEUTIC RIGHT THORACENTESIS MEDICATIONS: 10 mL 1% lidocaine  COMPLICATIONS: None immediate. PROCEDURE: An ultrasound guided thoracentesis was thoroughly discussed with the patient and questions answered. The benefits, risks, alternatives and complications were also discussed. The patient understands and wishes to proceed with the procedure. Written consent was obtained. Ultrasound was performed to localize and mark an adequate pocket of fluid in the right chest. The area was then prepped and draped in the normal sterile fashion. 1% Lidocaine  was used for local anesthesia. Under ultrasound guidance a 6 Fr Safe-T-Centesis catheter was introduced. Thoracentesis was  performed. The catheter was removed and a dressing applied. FINDINGS: A total of approximately 820 mL of yellow fluid was removed. Samples were sent to the laboratory as requested by the clinical team. IMPRESSION: Successful ultrasound guided diagnostic and therapeutic RIGHT thoracentesis yielding 820 mL of pleural fluid. Performed by: Wyatt Pommier, PA-C Electronically Signed   By: Art Largo M.D.   On: 11/17/2023 16:41   DG Chest 1 View Result Date: 11/16/2023 CLINICAL DATA:  confusion, cancer history EXAM: CHEST  1 VIEW COMPARISON:  08/27/2023 FINDINGS: Interval development of moderate right pleural effusion, with atelectasis/consolidation the right lung base. There is some linear right suprahilar scarring or  atelectasis. Left lung clear. Heart size and mediastinal contours are within normal limits. Small caliber catheter placement through the left neck, chest, and upper abdomen. Visualized bones unremarkable. IMPRESSION: Moderate right pleural effusion with right basilar atelectasis/consolidation. Electronically Signed   By: Nicoletta Barrier M.D.   On: 11/16/2023 17:56   CT HEAD WO CONTRAST Result Date: 11/16/2023 CLINICAL DATA:  Mental status change of unknown cause. Head and neck cancer being treated with radiation. EXAM: CT HEAD WITHOUT CONTRAST TECHNIQUE: Contiguous axial images were obtained from the base of the skull through the vertex without intravenous contrast. RADIATION DOSE REDUCTION: This exam was performed according to the departmental dose-optimization program which includes automated exposure control, adjustment of the mA and/or kV according to patient size and/or use of iterative reconstruction technique. COMPARISON:  10/15/2022 FINDINGS: Brain: The brain shows a normal appearance without evidence of malformation, atrophy, old or acute small or large vessel infarction, mass lesion, hemorrhage, hydrocephalus or extra-axial collection. Vascular: No hyperdense vessel. No evidence of  atherosclerotic calcification. Skull: Normal.  No traumatic finding.  No focal bone lesion. Sinuses/Orbits: Sinuses are clear. Orbits appear normal. Mastoids are clear. Other: Upper cervical neurostimulator in place. IMPRESSION: Normal head CT. Upper cervical neurostimulator in place. Electronically Signed   By: Bettylou Brunner M.D.   On: 11/16/2023 16:21   (Echo, Carotid, EGD, Colonoscopy, ERCP)    Subjective: Very anxious about going home today does not want to stay another night he wants to leave before midnight whether thoracentesis is done or not  Discharge Exam: Vitals:   11/22/23 1025 11/22/23 1223  BP:  127/87  Pulse:  (!) 107  Resp:  18  Temp:  98.2 F (36.8 C)  SpO2: 100% 100%   Vitals:   11/21/23 2149 11/22/23 0534 11/22/23 1025 11/22/23 1223  BP: 125/86 138/87  127/87  Pulse: (!) 108 (!) 104  (!) 107  Resp: 17 20  18   Temp: 98.6 F (37 C) 98.7 F (37.1 C)  98.2 F (36.8 C)  TempSrc: Oral Oral  Oral  SpO2:  98% 100% 100%  Weight:      Height:        General: Pt is alert, awake, not in acute distress Cardiovascular: RRR, S1/S2 +, no rubs, no gallops Respiratory: Diminished sounds on the right  Abdominal: Soft, NT, ND, bowel sounds + Extremities: no edema, no cyanosis    The results of significant diagnostics from this hospitalization (including imaging, microbiology, ancillary and laboratory) are listed below for reference.     Microbiology: Recent Results (from the past 240 hours)  Blood culture (routine x 2)     Status: None   Collection Time: 11/16/23  5:45 PM   Specimen: BLOOD  Result Value Ref Range Status   Specimen Description   Final    BLOOD LEFT ANTECUBITAL Performed at [facility], [address], [place], [place] [address]    Special Requests   Final    BOTTLES DRAWN AEROBIC AND ANAEROBIC Blood Culture results may not be optimal due to an inadequate volume of blood received in culture bottles Performed at [facility], [address] [place], [place] [address]    Culture   Final    NO GROWTH 5 DAYS Performed at [facility], [address] [place], [place] [address]    Report Status 11/21/2023 FINAL  Final  Blood culture (routine x 2)     Status: None   Collection Time: 11/16/23  5:45  PM   Specimen: BLOOD  Result Value Ref Range Status   Specimen Description   Final    BLOOD RIGHT ANTECUBITAL Performed at [facility], [address] [place], [place] [address]    Special Requests   Final    BOTTLES DRAWN AEROBIC AND ANAEROBIC Blood Culture results may not be optimal due to an inadequate volume of blood received in culture bottles Performed at [facility], [address], [place], [place] [address]    Culture   Final    NO GROWTH 5 DAYS Performed at [facility], [address] [place], [place] [address]    Report Status 11/21/2023 FINAL  Final  MRSA Next Gen by PCR, Nasal     Status: None   Collection Time: 11/17/23  5:42 AM   Specimen: Nasal Mucosa; Nasal Swab  Result Value Ref Range Status   MRSA by PCR Next Gen NOT DETECTED NOT DETECTED Final    Comment: (NOTE) The GeneXpert MRSA Assay (FDA approved for NASAL specimens only), is one component of a comprehensive MRSA colonization surveillance program. It is not intended to diagnose MRSA infection nor to guide or monitor treatment for MRSA infections. Test performance is not FDA approved in patients less than 47 years old. Performed at [facility], [address], [place], [place] [address]   Acid Fast Smear (AFB)     Status: None   Collection Time: 11/17/23  1:45 PM   Specimen: PATH Cytology Pleural fluid  Result Value Ref Range Status   AFB Specimen Processing Concentration  Final   Acid Fast Smear Negative  Final    Comment: (NOTE) Performed At: [facility] [address] [place], [place] [id number] Pearlean Botts MD  MV:[id number]    Source (AFB) PLEURAL  Final    Comment: Performed at [facility], [address], [place], [place] [address]  Culture, body fluid w Gram Stain-bottle     Status: None (Preliminary result)   Collection Time: 11/17/23  1:45 PM   Specimen: Pleura  Result Value Ref Range Status   Specimen Description PLEURAL  Final   Special Requests NONE  Final   Culture   Final    NO GROWTH 4 DAYS Performed at [facility], [address], [place], [place] [address]    Report Status PENDING  Incomplete  Gram stain     Status: None   Collection Time: 11/17/23  1:45 PM   Specimen: Pleura  Result Value Ref Range Status   Specimen Description PLEURAL  Final   Special Requests NONE  Final   Gram Stain   Final    WBC PRESENT,BOTH PMN AND MONONUCLEAR NO ORGANISMS SEEN CYTOSPIN SMEAR Performed at [facility], [address], [place], [place] [address]    Report Status 11/17/2023 FINAL  Final     Labs: BNP (last 3 results) No results for input(s): "BNP" in the last 8760 hours. Basic Metabolic Panel: Recent Labs  Lab 11/17/23 0313 11/18/23 0305 11/19/23 0306 11/20/23 1129 11/21/23 0407  NA 136 135 136 140 136  K 3.7 3.5 3.3* 3.9 3.6  CL 101 98 99 102 96*  CO2 27 26 26 27 29   GLUCOSE 122* 96 109* 95 92  BUN 13 11 15 9 9   CREATININE 0.57* 0.59* 0.36* 0.42* 0.49*  CALCIUM  8.0* 8.6* 8.3* 8.7* 8.9   Liver Function Tests: Recent Labs  Lab 11/16/23 1547 11/18/23 0305 11/20/23 1129  11/21/23 0407  AST 28 24 17 20   ALT 23 22 18 19   ALKPHOS 107 102 83 91  BILITOT 0.4 0.7 0.6 0.7  PROT 6.5 6.2* 6.0* 6.5  ALBUMIN 2.9* 2.6* 2.6* 2.7*   No results for input(s): "LIPASE", "AMYLASE" in the last 168 hours. Recent Labs  Lab 11/16/23 1547  AMMONIA 23   CBC: Recent Labs  Lab 11/16/23 1547 11/17/23 0313 11/18/23 0305 11/19/23 0306 11/20/23 1129 11/21/23 0407  WBC 16.9* 19.5* 19.4* 16.7* 14.7* 15.4*  NEUTROABS 12.3*  --   --   --   --   --    HGB 9.2* 9.7* 9.8* 8.9* 9.7* 9.9*  HCT 28.3* 30.6* 30.8* 28.8* 31.7* 31.4*  MCV 91.3 93.0 91.4 95.4 96.1 92.9  PLT 397 376 403* 382 426* 449*   Cardiac Enzymes: No results for input(s): "CKTOTAL", "CKMB", "CKMBINDEX", "TROPONINI" in the last 168 hours. BNP: Invalid input(s): "POCBNP" CBG: No results for input(s): "GLUCAP" in the last 168 hours. D-Dimer No results for input(s): "DDIMER" in the last 72 hours. Hgb A1c No results for input(s): "HGBA1C" in the last 72 hours. Lipid Profile No results for input(s): "CHOL", "HDL", "LDLCALC", "TRIG", "CHOLHDL", "LDLDIRECT" in the last 72 hours. Thyroid  function studies No results for input(s): "TSH", "T4TOTAL", "T3FREE", "THYROIDAB" in the last 72 hours.  Invalid input(s): "FREET3" Anemia work up No results for input(s): "VITAMINB12", "FOLATE", "FERRITIN", "TIBC", "IRON", "RETICCTPCT" in the last 72 hours. Urinalysis    Component Value Date/Time   COLORURINE YELLOW 10/06/2015 1942   APPEARANCEUR CLEAR 10/06/2015 1942   LABSPEC 1.035 (H) 10/06/2015 1942   PHURINE 5.0 10/06/2015 1942   GLUCOSEU NEGATIVE 10/06/2015 1942   HGBUR NEGATIVE 10/06/2015 1942   BILIRUBINUR SMALL (A) 10/06/2015 1942   KETONESUR 40 (A) 10/06/2015 1942   PROTEINUR NEGATIVE 10/06/2015 1942   NITRITE NEGATIVE 10/06/2015 1942   LEUKOCYTESUR NEGATIVE 10/06/2015 1942   Sepsis Labs Recent Labs  Lab 11/18/23 0305 11/19/23 0306 11/20/23 1129 11/21/23 0407  WBC 19.4* 16.7* 14.7* 15.4*   Microbiology Recent Results (from the past 240 hours)  Blood culture (routine x 2)     Status: None   Collection Time: 11/16/23  5:45 PM   Specimen: BLOOD  Result Value Ref Range Status   Specimen Description   Final    BLOOD LEFT ANTECUBITAL Performed at [facility], [address], [place], [place] [address]    Special Requests   Final    BOTTLES DRAWN AEROBIC AND ANAEROBIC Blood Culture results may not be optimal due to an inadequate volume of  blood received in culture bottles Performed at [facility], [address] [place], [place] [address]    Culture   Final    NO GROWTH 5 DAYS Performed at [facility], [address] [place], [place] [address]    Report Status 11/21/2023 FINAL  Final  Blood culture (routine x 2)     Status: None   Collection Time: 11/16/23  5:45 PM   Specimen: BLOOD  Result Value Ref Range Status   Specimen Description   Final    BLOOD RIGHT ANTECUBITAL Performed at [facility], [address], [place], [place] [address]    Special Requests   Final    BOTTLES DRAWN AEROBIC AND ANAEROBIC Blood Culture results may not be optimal due to an inadequate volume of blood received in culture bottles Performed at [facility], [address] [place], [place] [address]  Culture   Final    NO GROWTH 5 DAYS Performed at [facility] Lab, [address], [place], [place] [address]    Report Status 11/21/2023 FINAL  Final  MRSA Next Gen by PCR, Nasal     Status: None   Collection Time: 11/17/23  5:42 AM   Specimen: Nasal Mucosa; Nasal Swab  Result Value Ref Range Status   MRSA by PCR Next Gen NOT DETECTED NOT DETECTED Final    Comment: (NOTE) The GeneXpert MRSA Assay (FDA approved for NASAL specimens only), is one component of a comprehensive MRSA colonization surveillance program. It is not intended to diagnose MRSA infection nor to guide or monitor treatment for MRSA infections. Test performance is not FDA approved in patients less than 24 years old. Performed at [facility], [address] [place], [place] [address]   Acid Fast Smear (AFB)     Status: None   Collection Time: 11/17/23  1:45 PM   Specimen: PATH Cytology Pleural fluid  Result Value Ref Range Status   AFB Specimen Processing Concentration  Final   Acid Fast Smear Negative  Final    Comment: (NOTE) Performed At: [facility] [address] [place], [place] [id number] Pearlean Botts MD WG:[id number]    Source (AFB) PLEURAL  Final    Comment: Performed at [facility], [address] [place], [place] [address]  Culture, body fluid w Gram Stain-bottle     Status: None (Preliminary result)   Collection Time: 11/17/23  1:45 PM   Specimen: Pleura  Result Value Ref Range Status   Specimen Description PLEURAL  Final   Special Requests NONE  Final   Culture   Final    NO GROWTH 4 DAYS Performed at [facility], [address] [place], [place] [address]    Report Status PENDING  Incomplete  Gram stain     Status: None   Collection Time: 11/17/23  1:45 PM   Specimen: Pleura  Result Value Ref Range Status   Specimen Description PLEURAL  Final   Special Requests NONE  Final   Gram Stain   Final    WBC PRESENT,BOTH PMN AND MONONUCLEAR NO ORGANISMS SEEN CYTOSPIN SMEAR Performed at [facility], [address], [place], [place] [address]    Report Status 11/17/2023 FINAL  Final     Time coordinating discharge:  38 minutes  SIGNED:  Barbee Lew, MD  [facility] 11/22/2023, 1:59 PM

## 2023-11-22 NOTE — Progress Notes (Signed)
 Brian [name]   DOB:05-Oct-1960   ZO#:[id number]      ASSESSMENT & PLAN:  Non-small cell lung cancer with metastasis Pleural effusion -Initially diagnosed January 2020 with recurrence in June 2021 - S/p concurrent chemoradiation therapy with weekly carbo and paclitaxel .  Also s/p immunotherapy with Imfinzi .  Subsequently received Carbo Alimta  and Keytruda . - More recently was on [name] p.o. however due to progression of disease was switched to [name] .  This has now been discontinued due to altered mental status/hallucinations for which patient was admitted. -PET scan done 10/22/2023 showed bil hypermetabolic lymph nodes in the lower neck and upper thoracic inlet consistent with mets.  Large right pleural effusion also shown. - Status post radiation therapy to bilateral cervical lymph nodes, completed last week.   - Status post ultrasound-guided right thoracentesis on 11/17/2023 with 820 mL yellow fluid removed.   - Medical oncology/Dr. Liam [name] following closely.   Anemia -Hemoglobin stable 9.9 today - Likely secondary to malignancy - Transfuse PRBC for Hgb <7.0.  No transfusional intervention required at this time - Continue to monitor CBC with differential   Leukocytosis Pneumonia -WBC trending down 15.4 today - Monitor fever curve, remains afebrile - Continue antibiotics as ordered   Altered mental status Agitation -Patient with significant improvement noted -Wife stated patient had hallucinations at home and subsequently brought him into the ED - CT of head done 11/16/2023 is negative. -Status post Ativan  and Haldol  - [name]  on hold - Supportive care.  Monitor closely   Chronic neuropathy and weakness - Patient with bilateral arm pain weakness and atrophy of upper extremities, right worse than left - Continue supportive care    Code Status Full  Subjective:  Patient seen awake alert and oriented sitting up in chair at bedside.  Reports that he feels much  better every day.  Denies pain.  Denies any bleeding.  Wants to know what treatment Dr. Liam [name] will put him on.  No acute distress is noted.  Objective:  Vitals:   11/22/23 0534 11/22/23 1025  BP: 138/87   Pulse: (!) 104   Resp: 20   Temp: 98.7 F (37.1 C)   SpO2: 98% 100%     Intake/Output Summary (Last 24 hours) at 11/22/2023 1041 Last data filed at 11/22/2023 0600 Gross per 24 hour  Intake 810 ml  Output 550 ml  Net 260 ml     PHYSICAL EXAMINATION: ECOG PERFORMANCE STATUS: 3 - Symptomatic, >50% confined to bed  Vitals:   11/22/23 0534 11/22/23 1025  BP: 138/87   Pulse: (!) 104   Resp: 20   Temp: 98.7 F (37.1 C)   SpO2: 98% 100%   Filed Weights   11/17/23 0227 11/19/23 1259  Weight: 155 lb 10.3 oz (70.6 kg) 155 lb 3.3 oz (70.4 kg)    GENERAL: alert, no distress and comfortable SKIN: skin color, texture, turgor are normal, no rashes or significant lesions EYES: normal, conjunctiva are pink and non-injected, sclera clear OROPHARYNX: no exudate, no erythema and lips, buccal mucosa, and tongue normal  NECK: supple, thyroid  normal size, non-tender, without nodularity LYMPH: no palpable lymphadenopathy in the cervical, axillary or inguinal LUNGS: clear to auscultation and percussion with normal breathing effort HEART: regular rate & rhythm and no murmurs and no lower extremity edema ABDOMEN: abdomen soft, non-tender and normal bowel sounds MUSCULOSKELETAL: no cyanosis of digits and no clubbing  PSYCH: alert & oriented x 3 with fluent speech NEURO: no focal motor/sensory deficits   All questions were answered. The  patient knows to call the clinic with any problems, questions or concerns.   The total time spent in the appointment was 40 minutes encounter with patient including review of chart and various tests results, discussions about plan of care and coordination of care plan  Brian Mate, NP 11/22/2023 10:41 AM    Labs Reviewed:  Lab Results  Component  Value Date   WBC 15.4 (H) 11/21/2023   HGB 9.9 (L) 11/21/2023   HCT 31.4 (L) 11/21/2023   MCV 92.9 11/21/2023   PLT 449 (H) 11/21/2023   Recent Labs    11/18/23 0305 11/19/23 0306 11/20/23 1129 11/21/23 0407  NA 135 136 140 136  K 3.5 3.3* 3.9 3.6  CL 98 99 102 96*  CO2 26 26 27 29   GLUCOSE 96 109* 95 92  BUN 11 15 9 9   CREATININE 0.59* 0.36* 0.42* 0.49*  CALCIUM  8.6* 8.3* 8.7* 8.9  GFRNONAA >60 >60 >60 >60  PROT 6.2*  --  6.0* 6.5  ALBUMIN 2.6*  --  2.6* 2.7*  AST 24  --  17 20  ALT 22  --  18 19  ALKPHOS 102  --  83 91  BILITOT 0.7  --  0.6 0.7    Studies Reviewed:  VAS US  LOWER EXTREMITY VENOUS (DVT) Result Date: 11/17/2023  Lower Venous DVT Study Patient Name:  Brian [name]  Date of Exam:   11/17/2023 Medical Rec #: [id number]         Accession #:    [id number] Date of Birth: 01/08/1961        Patient Gender: M Patient Age:   8 years Exam Location:  [facility] Procedure:      VAS US  LOWER EXTREMITY VENOUS (DVT) Referring Phys: [facility] GOEL --------------------------------------------------------------------------------  Indications: Swelling, Edema, and SOB. Other Indications: H/O DVT and SVT, throat cancer. Comparison Study: Previous study on 1.4.2022. Performing Technologist: Ria Chad  Examination Guidelines: A complete evaluation includes B-mode imaging, spectral Doppler, color Doppler, and power Doppler as needed of all accessible portions of each vessel. Bilateral testing is considered an integral part of a complete examination. Limited examinations for reoccurring indications may be performed as noted. The reflux portion of the exam is performed with the patient in reverse Trendelenburg.  +---------+---------------+---------+-----------+----------+--------------+ RIGHT    CompressibilityPhasicitySpontaneityPropertiesThrombus Aging +---------+---------------+---------+-----------+----------+--------------+ CFV      Full           Yes      Yes                                  +---------+---------------+---------+-----------+----------+--------------+ SFJ      Full           Yes      Yes                                 +---------+---------------+---------+-----------+----------+--------------+ FV Prox  Full                                                        +---------+---------------+---------+-----------+----------+--------------+ FV Mid   Full                                                        +---------+---------------+---------+-----------+----------+--------------+  FV DistalFull                                                        +---------+---------------+---------+-----------+----------+--------------+ PFV      Full                                                        +---------+---------------+---------+-----------+----------+--------------+ POP      Full           Yes      Yes                                 +---------+---------------+---------+-----------+----------+--------------+ PTV      Full                                                        +---------+---------------+---------+-----------+----------+--------------+ PERO     Full                                                        +---------+---------------+---------+-----------+----------+--------------+   +---------+---------------+---------+-----------+----------+--------------+ LEFT     CompressibilityPhasicitySpontaneityPropertiesThrombus Aging +---------+---------------+---------+-----------+----------+--------------+ CFV      Full           Yes      Yes                                 +---------+---------------+---------+-----------+----------+--------------+ SFJ      Full           Yes      Yes                                 +---------+---------------+---------+-----------+----------+--------------+ FV Prox  Full                                                         +---------+---------------+---------+-----------+----------+--------------+ FV Mid   Full                                                        +---------+---------------+---------+-----------+----------+--------------+ FV DistalFull                                                        +---------+---------------+---------+-----------+----------+--------------+  PFV      Full                                                        +---------+---------------+---------+-----------+----------+--------------+ POP      Full           Yes      Yes                                 +---------+---------------+---------+-----------+----------+--------------+ PTV      Full                                                        +---------+---------------+---------+-----------+----------+--------------+ PERO     Full                                                        +---------+---------------+---------+-----------+----------+--------------+ GSV      Partial        No       No                                  +---------+---------------+---------+-----------+----------+--------------+ A small segment of the GSV at the middle calf level is thrombosed and chronic.    Summary: RIGHT: - There is no evidence of deep vein thrombosis in the lower extremity.  - No cystic structure found in the popliteal fossa.   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  - There is no evidence of deep vein thrombosis in the lower extremity.  - No cystic structure found in the popliteal fossa.   *See table(s) above for measurements and observations. Electronically signed by Irvin Mantel on 11/17/2023 at 8:04:36 PM.    Final    DG Chest 1 View Result Date: 11/17/2023 CLINICAL DATA:  [id number] S/P thoracentesis [id number] EXAM: PORTABLE CHEST 1 VIEW COMPARISON:  Chest XR, 11/15/2023.  CT chest, 08/13/2023. FINDINGS: Support lines: Spinal  cord stimulator, with leads extending outside field of view. Cardiomediastinal silhouette is within normal limits. The LEFT lung is well inflated. Improved aeration of the RIGHT lung post thoracentesis with small volume residual pleural effusion. RIGHT perihilar pulmonary opacity with trace residual fluid layering along the minor fissure. No pneumothorax. RIGHT AC joint degenerative change. No acute osseous abnormality. IMPRESSION: Improved aeration of the RIGHT lung post thoracentesis with small volume residual pleural effusion. No pneumothorax. Electronically Signed   By: Art Largo M.D.   On: 11/17/2023 16:54   US  THORACENTESIS ASP PLEURAL SPACE W/IMG GUIDE Result Date: 11/17/2023 INDICATION: Patient with history of non-small cell lung cancer of right lung, recurrent pleural effusion. IR consulted for diagnostic and therapeutic right thoracentesis. EXAM: ULTRASOUND GUIDED DIAGNOSTIC AND THERAPEUTIC RIGHT THORACENTESIS MEDICATIONS: 10 mL 1% lidocaine  COMPLICATIONS: None immediate. PROCEDURE: An ultrasound guided thoracentesis was thoroughly discussed with the patient and questions answered.  The benefits, risks, alternatives and complications were also discussed. The patient understands and wishes to proceed with the procedure. Written consent was obtained. Ultrasound was performed to localize and mark an adequate pocket of fluid in the right chest. The area was then prepped and draped in the normal sterile fashion. 1% Lidocaine  was used for local anesthesia. Under ultrasound guidance a 6 Fr Safe-T-Centesis catheter was introduced. Thoracentesis was performed. The catheter was removed and a dressing applied. FINDINGS: A total of approximately 820 mL of yellow fluid was removed. Samples were sent to the laboratory as requested by the clinical team. IMPRESSION: Successful ultrasound guided diagnostic and therapeutic RIGHT thoracentesis yielding 820 mL of pleural fluid. Performed by: Wyatt Pommier, PA-C  Electronically Signed   By: Art Largo M.D.   On: 11/17/2023 16:41   DG Chest 1 View Result Date: 11/16/2023 CLINICAL DATA:  confusion, cancer history EXAM: CHEST  1 VIEW COMPARISON:  08/27/2023 FINDINGS: Interval development of moderate right pleural effusion, with atelectasis/consolidation the right lung base. There is some linear right suprahilar scarring or atelectasis. Left lung clear. Heart size and mediastinal contours are within normal limits. Small caliber catheter placement through the left neck, chest, and upper abdomen. Visualized bones unremarkable. IMPRESSION: Moderate right pleural effusion with right basilar atelectasis/consolidation. Electronically Signed   By: Nicoletta Barrier M.D.   On: 11/16/2023 17:56   CT HEAD WO CONTRAST Result Date: 11/16/2023 CLINICAL DATA:  Mental status change of unknown cause. Head and neck cancer being treated with radiation. EXAM: CT HEAD WITHOUT CONTRAST TECHNIQUE: Contiguous axial images were obtained from the base of the skull through the vertex without intravenous contrast. RADIATION DOSE REDUCTION: This exam was performed according to the departmental dose-optimization program which includes automated exposure control, adjustment of the mA and/or kV according to patient size and/or use of iterative reconstruction technique. COMPARISON:  10/15/2022 FINDINGS: Brain: The brain shows a normal appearance without evidence of malformation, atrophy, old or acute small or large vessel infarction, mass lesion, hemorrhage, hydrocephalus or extra-axial collection. Vascular: No hyperdense vessel. No evidence of atherosclerotic calcification. Skull: Normal.  No traumatic finding.  No focal bone lesion. Sinuses/Orbits: Sinuses are clear. Orbits appear normal. Mastoids are clear. Other: Upper cervical neurostimulator in place. IMPRESSION: Normal head CT. Upper cervical neurostimulator in place. Electronically Signed   By: Bettylou Brunner M.D.   On: 11/16/2023 16:21

## 2023-11-22 NOTE — Plan of Care (Signed)

## 2023-11-22 NOTE — TOC Transition Note (Signed)
 Transition of Care [facility]) - Discharge Note   Patient Details  Name: Brian [name] MRN: [id number] Date of Birth: 14-Aug-1960  Transition of [facility]) CM/SW Contact:  Gertha Ku, LCSW Phone Number: 11/22/2023, 3:08 PM   Clinical Narrative:     CSW met with the pt to discuss recommendations for home health services. Pt declined, stating he does not believe he will need them. He reported that he lives with his wife, who will be picking him up upon discharge.  Pt is recommended to use home oxygen  and agrees with the recommendation. He has no preference for a DME company. A referral was sent to [employer] to deliver a portable unit to the pt's room prior to discharge. No further TOC needs , TOC sign off.    Final next level of care: Home/Self Care Barriers to Discharge: Continued Medical Work up   Patient Goals and CMS Choice Patient states their goals for this hospitalization and ongoing recovery are:: retrun home CMS Medicare.gov Compare Post Acute Care list provided to:: Patient Choice offered to / list presented to : Patient [place] ownership interest in [facility].provided to::  (NA)    Discharge Placement                    Patient and family notified of of transfer: 11/22/23  Discharge Plan and Services Additional resources added to the After Visit Summary for   In-house Referral: NA              DME Arranged: N/A DME Agency: [employer] Date DME Agency Contacted: 11/22/23   Representative spoke with at DME Agency: [name]: NA HH Agency: NA        Social Drivers of Health (SDOH) Interventions SDOH Screenings   Food Insecurity: No Food Insecurity (11/18/2023)  Housing: Low Risk  (11/18/2023)  Transportation Needs: No Transportation Needs (11/18/2023)  Utilities: Not At Risk (11/18/2023)  Depression (PHQ2-9): Low Risk  (09/01/2023)  Tobacco Use: Medium Risk (11/19/2023)     Readmission Risk Interventions     11/17/2023    8:45 AM  Readmission Risk Prevention Plan  Transportation Screening Complete  PCP or Specialist Appt within 5-7 Days Complete  Home Care Screening Complete  Medication Review (RN CM) Complete

## 2023-11-22 NOTE — Procedures (Signed)
 PROCEDURE SUMMARY:  Successful image-guided right thoracentesis. Yielded .070 liters of clear, dark amber pleural fluid. Patient tolerated procedure well. EBL: Zero No immediate complications.   Post procedure CXR pending.  Please see imaging section of Epic for full dictation.  Gordy Lauber Meyer Dockery PA-C 11/22/2023 4:02 PM

## 2023-11-23 DIAGNOSIS — J189 Pneumonia, unspecified organism: Secondary | ICD-10-CM | POA: Diagnosis not present

## 2023-11-24 ENCOUNTER — Inpatient Hospital Stay

## 2023-11-24 ENCOUNTER — Inpatient Hospital Stay (HOSPITAL_BASED_OUTPATIENT_CLINIC_OR_DEPARTMENT_OTHER): Admitting: Physician Assistant

## 2023-11-24 VITALS — BP 128/89 | HR 106 | Temp 98.0°F | Resp 16 | Wt 141.1 lb

## 2023-11-24 DIAGNOSIS — C770 Secondary and unspecified malignant neoplasm of lymph nodes of head, face and neck: Secondary | ICD-10-CM | POA: Diagnosis not present

## 2023-11-24 DIAGNOSIS — C3411 Malignant neoplasm of upper lobe, right bronchus or lung: Secondary | ICD-10-CM | POA: Diagnosis not present

## 2023-11-24 DIAGNOSIS — K5900 Constipation, unspecified: Secondary | ICD-10-CM | POA: Diagnosis not present

## 2023-11-24 DIAGNOSIS — Z9981 Dependence on supplemental oxygen: Secondary | ICD-10-CM | POA: Diagnosis not present

## 2023-11-24 DIAGNOSIS — C3491 Malignant neoplasm of unspecified part of right bronchus or lung: Secondary | ICD-10-CM | POA: Diagnosis not present

## 2023-11-24 DIAGNOSIS — Z7189 Other specified counseling: Secondary | ICD-10-CM

## 2023-11-24 DIAGNOSIS — Z9221 Personal history of antineoplastic chemotherapy: Secondary | ICD-10-CM | POA: Diagnosis not present

## 2023-11-24 DIAGNOSIS — Z79899 Other long term (current) drug therapy: Secondary | ICD-10-CM | POA: Diagnosis not present

## 2023-11-24 DIAGNOSIS — C7931 Secondary malignant neoplasm of brain: Secondary | ICD-10-CM | POA: Diagnosis not present

## 2023-11-24 DIAGNOSIS — Z923 Personal history of irradiation: Secondary | ICD-10-CM | POA: Diagnosis not present

## 2023-11-24 LAB — CBC WITH DIFFERENTIAL (CANCER CENTER ONLY)
Abs Immature Granulocytes: 0.2 10*3/uL — ABNORMAL HIGH (ref 0.00–0.07)
Basophils Absolute: 0.2 10*3/uL — ABNORMAL HIGH (ref 0.0–0.1)
Basophils Relative: 1 %
Eosinophils Absolute: 0.5 10*3/uL (ref 0.0–0.5)
Eosinophils Relative: 3 %
HCT: 29.5 % — ABNORMAL LOW (ref 39.0–52.0)
Hemoglobin: 9.4 g/dL — ABNORMAL LOW (ref 13.0–17.0)
Immature Granulocytes: 1 %
Lymphocytes Relative: 4 %
Lymphs Abs: 0.6 10*3/uL — ABNORMAL LOW (ref 0.7–4.0)
MCH: 28.8 pg (ref 26.0–34.0)
MCHC: 31.9 g/dL (ref 30.0–36.0)
MCV: 90.5 fL (ref 80.0–100.0)
Monocytes Absolute: 2.3 10*3/uL — ABNORMAL HIGH (ref 0.1–1.0)
Monocytes Relative: 14 %
Neutro Abs: 12.7 10*3/uL — ABNORMAL HIGH (ref 1.7–7.7)
Neutrophils Relative %: 77 %
Platelet Count: 453 10*3/uL — ABNORMAL HIGH (ref 150–400)
RBC: 3.26 MIL/uL — ABNORMAL LOW (ref 4.22–5.81)
RDW: 18.9 % — ABNORMAL HIGH (ref 11.5–15.5)
WBC Count: 16.5 10*3/uL — ABNORMAL HIGH (ref 4.0–10.5)
nRBC: 0 % (ref 0.0–0.2)

## 2023-11-24 LAB — CMP (CANCER CENTER ONLY)
ALT: 24 U/L (ref 0–44)
AST: 24 U/L (ref 15–41)
Albumin: 3.3 g/dL — ABNORMAL LOW (ref 3.5–5.0)
Alkaline Phosphatase: 92 U/L (ref 38–126)
Anion gap: 6 (ref 5–15)
BUN: 15 mg/dL (ref 6–20)
CO2: 33 mmol/L — ABNORMAL HIGH (ref 22–32)
Calcium: 8.9 mg/dL (ref 8.9–10.3)
Chloride: 102 mmol/L (ref 98–111)
Creatinine: 0.55 mg/dL — ABNORMAL LOW (ref 0.61–1.24)
GFR, Estimated: >60 mL/min (ref >60)
Glucose, Bld: 107 mg/dL — ABNORMAL HIGH (ref 70–99)
Potassium: 3.6 mmol/L (ref 3.5–5.1)
Sodium: 141 mmol/L (ref 135–145)
Total Bilirubin: 0.5 mg/dL (ref 0.0–1.2)
Total Protein: 6.4 g/dL — ABNORMAL LOW (ref 6.5–8.1)

## 2023-11-24 NOTE — Progress Notes (Unsigned)
 Contacted [employer] via email at [facility] in regards to re-consult with Dr. Meta Acres.  She states that patient was last seen in 2021 and will be considered a new patient. She will be in contact via email once patient is scheduled.

## 2023-11-30 ENCOUNTER — Telehealth: Payer: Self-pay

## 2023-11-30 NOTE — Telephone Encounter (Signed)
 Surgery was 10/05/23. He canceled his 6 week postop appointment because he was in the hospital. Can you contact him about rescheduling his 6 week post op appointment? We will need to let [facility] know of the new appointment date/time. Thank you

## 2023-11-30 NOTE — Telephone Encounter (Signed)
 TC from patients wife, Thersia Flax in regards to short term disability paperwork pick-up.  Informed Thersia Flax I would reach out to their department and see if paperwork can be expedited. Staff message sent to [facility] department.

## 2023-11-30 NOTE — Telephone Encounter (Signed)
 Left message to call back

## 2023-12-02 ENCOUNTER — Encounter: Payer: Self-pay | Admitting: Pulmonary Disease

## 2023-12-02 ENCOUNTER — Ambulatory Visit (INDEPENDENT_AMBULATORY_CARE_PROVIDER_SITE_OTHER): Admitting: Pulmonary Disease

## 2023-12-02 VITALS — BP 122/80 | HR 106 | Ht 68.0 in | Wt 140.0 lb

## 2023-12-02 DIAGNOSIS — J9601 Acute respiratory failure with hypoxia: Secondary | ICD-10-CM | POA: Diagnosis not present

## 2023-12-02 DIAGNOSIS — J910 Malignant pleural effusion: Secondary | ICD-10-CM

## 2023-12-02 DIAGNOSIS — C3491 Malignant neoplasm of unspecified part of right bronchus or lung: Secondary | ICD-10-CM | POA: Diagnosis not present

## 2023-12-02 NOTE — Telephone Encounter (Signed)
 Left message to call back

## 2023-12-02 NOTE — Progress Notes (Signed)
 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   He was recently admitted 4/15 to 4/21 for aletered mental status thought secondary to [name]  and pneumonia with acute hypoxemic respiratory failure. He was discharged on supplemental oxygen .   He had US  guided thoracentesis 11/22/23 ( ) and 11/17/23 ( ). No pleural fluid studies sent.  Recently seen in oncology clinic 11/24/23, note reviewed.  He reports fatigue/malaise and poor appetite. He has shortness of breath and cough. No hemoptysis. Denies fevers/chills.   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    Non-small cell cancer of right lung (HCC) 08/23/2018   Pneumonia    Pre-diabetes    Radiation-induced brachial plexopathy    Recurrent right pleural effusion    Secondary malignant neoplasm of brain and spinal cord (HCC)    Subarachnoid hemorrhage (HCC) 2016   Voice hoarseness      Family History  Problem Relation Age of Onset   Diabetes Mother    Diabetes Brother    Colon cancer Neg Hx    Stomach cancer Neg Hx    Thyroid  cancer Neg Hx      Social History   Socioeconomic History   Marital status: Married    Spouse name: Thersia Flax   Number of  children: 2   Years of education: Not on file   Highest education level: Not on file  Occupational History   Not on file  Tobacco Use   Smoking status: Former    Types: Cigarettes   Smokeless tobacco: Never  Vaping Use   Vaping status: Never Used  Substance and Sexual Activity   Alcohol use: Yes    Alcohol/week: 5.0 standard drinks of alcohol    Types: 5 Standard drinks or equivalent per week    Comment: occasionally   Drug use: No   Sexual activity: Not on file  Other Topics Concern   Not on file  Social History Narrative   Are you right handed or left handed? Right   Are you currently employed ?    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   Who lives with you? wife   What type of home do you live in: 1 story or 2 story? Two    Caffeine  3 cups a day    Social Drivers of Corporate investment banker Strain: Not on file  Food Insecurity: No Food Insecurity (11/18/2023)   Hunger Vital Sign    Worried About Running Out of Food in the Last Year: Never true    Ran Out of Food in the Last Year: Never true  Transportation Needs: No Transportation Needs (11/18/2023)   PRAPARE -  Administrator, Civil Service (Medical): No    Lack of Transportation (Non-Medical): No  Physical Activity: Not on file  Stress: Not on file  Social Connections: Not on file  Intimate Partner Violence: Not At Risk (11/18/2023)   Humiliation, Afraid, Rape, and Kick questionnaire    Fear of Current or Ex-Partner: No    Emotionally Abused: No    Physically Abused: No    Sexually Abused: No     Allergies  Allergen Reactions   Lorazepam  Anxiety    Per MD, patient gets restless/delirious after Ativan  administration.     Outpatient Medications Prior to Visit  Medication Sig Dispense Refill   acetaminophen  (TYLENOL ) 500 MG tablet Take 1,000 mg by mouth every 6 (six) hours as needed (for pain).     albuterol  (VENTOLIN  HFA) 108 (90 Base) MCG/ACT inhaler INHALE 2  PUFFS INTO THE LUNGS EVERY 4 (FOUR) HOURS AS NEEDED FOR WHEEZING OR SHORTNESS OF BREATH. 6.7 each 4   amLODipine  (NORVASC ) 5 MG tablet Take 0.5 tablets (2.5 mg total) by mouth in the morning. 30 tablet 2   atorvastatin  (LIPITOR) 20 MG tablet Take 20 mg by mouth in the morning.     baclofen  (LIORESAL ) 10 MG tablet Take 10 mg by mouth at bedtime.     cyclobenzaprine  (FLEXERIL ) 5 MG tablet Take 1 tablet (5 mg total) by mouth 3 (three) times daily as needed for muscle spasms. 30 tablet 0   DULoxetine  (CYMBALTA ) 60 MG capsule Take 1 capsule (60 mg total) by mouth daily. 90 capsule 3   fluticasone  (FLONASE ) 50 MCG/ACT nasal spray Place 1 spray into both nostrils daily. 1 mL 2   levothyroxine  (SYNTHROID ) 100 MCG tablet Take 100 mcg by mouth daily before breakfast.     MIRALAX  17 GM/SCOOP powder Take 17 g by mouth daily as needed for mild constipation.     mirtazapine  (REMERON ) 15 MG tablet TAKE 1 TABLET BY MOUTH EVERYDAY AT BEDTIME 90 tablet 1   mupirocin  ointment (BACTROBAN ) 2 % Apply 1 Application topically See admin instructions. Apply to affected incision sites after a shower- morning and bedtime     ondansetron  (ZOFRAN ) 8 MG tablet Take 1 tablet (8 mg total) by mouth every 8 (eight) hours as needed for nausea or vomiting. (Patient taking differently: Take 8 mg by mouth See admin instructions. Take 8 mg by mouth in the morning and an additional 8 mg up to two times a day as needed for nausea and/or vomiting) 30 tablet 0   prochlorperazine  (COMPAZINE ) 10 MG tablet Take 1 tablet (10 mg total) by mouth every 6 (six) hours as needed. (Patient taking differently: Take 10 mg by mouth See admin instructions. Take 10 mg by mouth in the morning and an additional 10 mg up to three times a day as needed for nausea and/or vomiting) 30 tablet 2   TRELEGY ELLIPTA  100-62.5-25 MCG/INH AEPB Inhale 1 puff into the lungs in the morning.     venlafaxine  (EFFEXOR ) 37.5 MG tablet Take 37.5 mg by mouth in the morning.     No  facility-administered medications prior to visit.    Review of Systems  Constitutional:  Positive for malaise/fatigue and weight loss. Negative for chills and fever.  HENT:  Negative for congestion, sinus pain and sore throat.   Eyes: Negative.   Respiratory:  Positive for cough and shortness of breath. Negative for hemoptysis, sputum production and wheezing.   Cardiovascular:  Negative for chest pain, palpitations, orthopnea, claudication and  leg swelling.  Gastrointestinal:  Negative for abdominal pain, heartburn, nausea and vomiting.  Genitourinary: Negative.   Musculoskeletal:  Negative for joint pain and myalgias.  Skin:  Negative for rash.  Neurological:  Negative for weakness.  Endo/Heme/Allergies: Negative.   Psychiatric/Behavioral: Negative.     Objective:   Vitals:   12/02/23 0837 12/02/23 0915  BP: 122/80 122/80  Pulse: (!) 106 (!) 106  SpO2: 99% 99%  Weight: 140 lb (63.5 kg)   Height: 5\' 8"  (1.727 m)    Physical Exam Constitutional:      General: He is not in acute distress.    Appearance: Normal appearance.  Eyes:     General: No scleral icterus.    Conjunctiva/sclera: Conjunctivae normal.  Cardiovascular:     Rate and Rhythm: Normal rate and regular rhythm.  Pulmonary:     Breath sounds: Decreased air movement (right base) present. No wheezing, rhonchi or rales.  Musculoskeletal:     Right lower leg: No edema.     Left lower leg: No edema.  Skin:    General: Skin is warm and dry.  Neurological:     General: No focal deficit present.    CBC    Component Value Date/Time   WBC 16.5 (H) 11/24/2023 0947   WBC 15.4 (H) 11/21/2023 0407   RBC 3.26 (L) 11/24/2023 0947   HGB 9.4 (L) 11/24/2023 0947   HCT 29.5 (L) 11/24/2023 0947   PLT 453 (H) 11/24/2023 0947   MCV 90.5 11/24/2023 0947   MCH 28.8 11/24/2023 0947   MCHC 31.9 11/24/2023 0947   RDW 18.9 (H) 11/24/2023 0947   LYMPHSABS 0.6 (L) 11/24/2023 0947   MONOABS 2.3 (H) 11/24/2023 0947   EOSABS 0.5  11/24/2023 0947   BASOSABS 0.2 (H) 11/24/2023 0947      Latest Ref Rng & Units 11/24/2023    9:47 AM 11/21/2023    4:07 AM 11/20/2023   11:29 AM  BMP  Glucose 70 - 99 mg/dL 161  92  95   BUN 6 - 20 mg/dL 15  9  9    Creatinine 0.61 - 1.24 mg/dL 0.96  0.45  4.09   Sodium 135 - 145 mmol/L 141  136  140   Potassium 3.5 - 5.1 mmol/L 3.6  3.6  3.9   Chloride 98 - 111 mmol/L 102  96  102   CO2 22 - 32 mmol/L 33  29  27   Calcium  8.9 - 10.3 mg/dL 8.9  8.9  8.7    Chest imaging: CXR 11/22/23 Improved aeration of RIGHT lung post thoracentesis without residual pleural effusion. No pneumothorax.  NM PET Scan 10/22/23 1. Bilateral hypermetabolic lymph nodes in the lower neck/upper thoracic inlet consistent with nodal metastasis. 2. Hypermetabolic lymph node inferior to the carina consistent with nodal metastasis. 3. No lung nodules. 4. Large layering RIGHT pleural effusion.  PFT:    Latest Ref Rng & Units 08/18/2018    8:41 AM  PFT Results  FVC-Pre L 3.91   FVC-Predicted Pre % 82   FVC-Post L 4.06   FVC-Predicted Post % 85   Pre FEV1/FVC % % 79   Post FEV1/FCV % % 84   FEV1-Pre L 3.09   FEV1-Predicted Pre % 84   FEV1-Post L 3.39   DLCO uncorrected ml/min/mmHg 26.43   DLCO UNC% % 85   DLVA Predicted % 99   TLC L 5.72   TLC % Predicted % 84   RV % Predicted % 78  Labs:  Path:  Echo:  Heart Catheterization:     Assessment & Plan:   Adenocarcinoma of right lung, stage 4 (HCC) - Plan: Procedural/ Surgical Case Request: THORACENTESIS  Acute respiratory failure with hypoxia (HCC)  Malignant pleural effusion - Plan: Procedural/ Surgical Case Request: THORACENTESIS  Discussion: Milon Peniche is a 63 year old male, former smoker with COPD/Emphysema, 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    Pleural  effusion Recurrent with previous drainage. Current ultrasound shows small right-sided fluid pocket. Discussed PlurX catheter risks and monitoring. - Schedule thoracentesis at [facility] next Friday. - Discuss PlurX catheter placement if fluid accumulation increases.  Shortness of breath Likely related to pleural effusion and lung cancer. Discussed potential benefit of nebulizer for improved medication delivery. - Continue Trelegy inhaler, one puff daily. - Set up nebulizer machine and albuterol  solution for use during coughing episodes or increased dyspnea. - Consider albuterol  nebulizer in the morning before Trelegy to optimize lung function.  Lung cancer Contributing to pleural effusion and dyspnea. Potential progression affecting lung function and contributing to lethargy.  Oxygen  therapy Currently on 2 liters continuous oxygen . Discussed potential need for portable oxygen  concentrator.  Malnutrition Due to decreased appetite and intake, likely contributing to weakness and lethargy. Discussed importance of nutritional supplements. - Encourage intake of nutritional supplements like Boost or Ensure if solid food is too much.  Follow up in 2 months  Duaine German, MD [facility] Office: [phone]   Current Outpatient Medications:    acetaminophen  (TYLENOL ) 500 MG tablet, Take 1,000 mg by mouth every 6 (six) hours as needed (for pain)., Disp: , Rfl:    albuterol  (VENTOLIN  HFA) 108 (90 Base) MCG/ACT inhaler, INHALE 2 PUFFS INTO THE LUNGS EVERY 4 (FOUR) HOURS AS NEEDED FOR WHEEZING OR SHORTNESS OF BREATH., Disp: 6.7 each, Rfl: 4   amLODipine  (NORVASC ) 5 MG tablet, Take 0.5 tablets (2.5 mg total) by mouth in the morning., Disp: 30 tablet, Rfl: 2   atorvastatin  (LIPITOR) 20 MG tablet, Take 20 mg by mouth in the morning., Disp: , Rfl:    baclofen  (LIORESAL ) 10 MG tablet, Take 10 mg by mouth at bedtime., Disp: , Rfl:    cyclobenzaprine  (FLEXERIL ) 5 MG  tablet, Take 1 tablet (5 mg total) by mouth 3 (three) times daily as needed for muscle spasms., Disp: 30 tablet, Rfl: 0   DULoxetine  (CYMBALTA ) 60 MG capsule, Take 1 capsule (60 mg total) by mouth daily., Disp: 90 capsule, Rfl: 3   fluticasone  (FLONASE ) 50 MCG/ACT nasal spray, Place 1 spray into both nostrils daily., Disp: 1 mL, Rfl: 2   levothyroxine  (SYNTHROID ) 100 MCG tablet, Take 100 mcg by mouth daily before breakfast., Disp: , Rfl:    MIRALAX  17 GM/SCOOP powder, Take 17 g by mouth daily as needed for mild constipation., Disp: , Rfl:    mirtazapine  (REMERON ) 15 MG tablet, TAKE 1 TABLET BY MOUTH EVERYDAY AT BEDTIME, Disp: 90 tablet, Rfl: 1   mupirocin  ointment (BACTROBAN ) 2 %, Apply 1 Application topically See admin instructions. Apply to affected incision sites after a shower- morning and bedtime, Disp: , Rfl:    ondansetron  (ZOFRAN ) 8 MG tablet, Take 1 tablet (8 mg total) by mouth every 8 (eight) hours as needed for nausea or vomiting. (Patient taking differently: Take 8 mg by mouth See admin instructions. Take 8  mg by mouth in the morning and an additional 8 mg up to two times a day as needed for nausea and/or vomiting), Disp: 30 tablet, Rfl: 0   prochlorperazine  (COMPAZINE ) 10 MG tablet, Take 1 tablet (10 mg total) by mouth every 6 (six) hours as needed. (Patient taking differently: Take 10 mg by mouth See admin instructions. Take 10 mg by mouth in the morning and an additional 10 mg up to three times a day as needed for nausea and/or vomiting), Disp: 30 tablet, Rfl: 2   TRELEGY ELLIPTA  100-62.5-25 MCG/INH AEPB, Inhale 1 puff into the lungs in the morning., Disp: , Rfl:    venlafaxine  (EFFEXOR ) 37.5 MG tablet, Take 37.5 mg by mouth in the morning., Disp: , Rfl:

## 2023-12-02 NOTE — H&P (View-Only) (Signed)
 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   He was recently admitted 4/15 to 4/21 for aletered mental status thought secondary to [name]  and pneumonia with acute hypoxemic respiratory failure. He was discharged on supplemental oxygen .   He had US  guided thoracentesis 11/22/23 ( ) and 11/17/23 ( ). No pleural fluid studies sent.  Recently seen in oncology clinic 11/24/23, note reviewed.  He reports fatigue/malaise and poor appetite. He has shortness of breath and cough. No hemoptysis. Denies fevers/chills.   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    Non-small cell cancer of right lung (HCC) 08/23/2018   Pneumonia    Pre-diabetes    Radiation-induced brachial plexopathy    Recurrent right pleural effusion    Secondary malignant neoplasm of brain and spinal cord (HCC)    Subarachnoid hemorrhage (HCC) 2016   Voice hoarseness      Family History  Problem Relation Age of Onset   Diabetes Mother    Diabetes Brother    Colon cancer Neg Hx    Stomach cancer Neg Hx    Thyroid  cancer Neg Hx      Social History   Socioeconomic History   Marital status: Married    Spouse name: Thersia Flax   Number of  children: 2   Years of education: Not on file   Highest education level: Not on file  Occupational History   Not on file  Tobacco Use   Smoking status: Former    Types: Cigarettes   Smokeless tobacco: Never  Vaping Use   Vaping status: Never Used  Substance and Sexual Activity   Alcohol use: Yes    Alcohol/week: 5.0 standard drinks of alcohol    Types: 5 Standard drinks or equivalent per week    Comment: occasionally   Drug use: No   Sexual activity: Not on file  Other Topics Concern   Not on file  Social History Narrative   Are you right handed or left handed? Right   Are you currently employed ?    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   Who lives with you? wife   What type of home do you live in: 1 story or 2 story? Two    Caffeine  3 cups a day    Social Drivers of Corporate investment banker Strain: Not on file  Food Insecurity: No Food Insecurity (11/18/2023)   Hunger Vital Sign    Worried About Running Out of Food in the Last Year: Never true    Ran Out of Food in the Last Year: Never true  Transportation Needs: No Transportation Needs (11/18/2023)   PRAPARE -  Administrator, Civil Service (Medical): No    Lack of Transportation (Non-Medical): No  Physical Activity: Not on file  Stress: Not on file  Social Connections: Not on file  Intimate Partner Violence: Not At Risk (11/18/2023)   Humiliation, Afraid, Rape, and Kick questionnaire    Fear of Current or Ex-Partner: No    Emotionally Abused: No    Physically Abused: No    Sexually Abused: No     Allergies  Allergen Reactions   Lorazepam  Anxiety    Per MD, patient gets restless/delirious after Ativan  administration.     Outpatient Medications Prior to Visit  Medication Sig Dispense Refill   acetaminophen  (TYLENOL ) 500 MG tablet Take 1,000 mg by mouth every 6 (six) hours as needed (for pain).     albuterol  (VENTOLIN  HFA) 108 (90 Base) MCG/ACT inhaler INHALE 2  PUFFS INTO THE LUNGS EVERY 4 (FOUR) HOURS AS NEEDED FOR WHEEZING OR SHORTNESS OF BREATH. 6.7 each 4   amLODipine  (NORVASC ) 5 MG tablet Take 0.5 tablets (2.5 mg total) by mouth in the morning. 30 tablet 2   atorvastatin  (LIPITOR) 20 MG tablet Take 20 mg by mouth in the morning.     baclofen  (LIORESAL ) 10 MG tablet Take 10 mg by mouth at bedtime.     cyclobenzaprine  (FLEXERIL ) 5 MG tablet Take 1 tablet (5 mg total) by mouth 3 (three) times daily as needed for muscle spasms. 30 tablet 0   DULoxetine  (CYMBALTA ) 60 MG capsule Take 1 capsule (60 mg total) by mouth daily. 90 capsule 3   fluticasone  (FLONASE ) 50 MCG/ACT nasal spray Place 1 spray into both nostrils daily. 1 mL 2   levothyroxine  (SYNTHROID ) 100 MCG tablet Take 100 mcg by mouth daily before breakfast.     MIRALAX  17 GM/SCOOP powder Take 17 g by mouth daily as needed for mild constipation.     mirtazapine  (REMERON ) 15 MG tablet TAKE 1 TABLET BY MOUTH EVERYDAY AT BEDTIME 90 tablet 1   mupirocin  ointment (BACTROBAN ) 2 % Apply 1 Application topically See admin instructions. Apply to affected incision sites after a shower- morning and bedtime     ondansetron  (ZOFRAN ) 8 MG tablet Take 1 tablet (8 mg total) by mouth every 8 (eight) hours as needed for nausea or vomiting. (Patient taking differently: Take 8 mg by mouth See admin instructions. Take 8 mg by mouth in the morning and an additional 8 mg up to two times a day as needed for nausea and/or vomiting) 30 tablet 0   prochlorperazine  (COMPAZINE ) 10 MG tablet Take 1 tablet (10 mg total) by mouth every 6 (six) hours as needed. (Patient taking differently: Take 10 mg by mouth See admin instructions. Take 10 mg by mouth in the morning and an additional 10 mg up to three times a day as needed for nausea and/or vomiting) 30 tablet 2   TRELEGY ELLIPTA  100-62.5-25 MCG/INH AEPB Inhale 1 puff into the lungs in the morning.     venlafaxine  (EFFEXOR ) 37.5 MG tablet Take 37.5 mg by mouth in the morning.     No  facility-administered medications prior to visit.    Review of Systems  Constitutional:  Positive for malaise/fatigue and weight loss. Negative for chills and fever.  HENT:  Negative for congestion, sinus pain and sore throat.   Eyes: Negative.   Respiratory:  Positive for cough and shortness of breath. Negative for hemoptysis, sputum production and wheezing.   Cardiovascular:  Negative for chest pain, palpitations, orthopnea, claudication and  leg swelling.  Gastrointestinal:  Negative for abdominal pain, heartburn, nausea and vomiting.  Genitourinary: Negative.   Musculoskeletal:  Negative for joint pain and myalgias.  Skin:  Negative for rash.  Neurological:  Negative for weakness.  Endo/Heme/Allergies: Negative.   Psychiatric/Behavioral: Negative.     Objective:   Vitals:   12/02/23 0837 12/02/23 0915  BP: 122/80 122/80  Pulse: (!) 106 (!) 106  SpO2: 99% 99%  Weight: 140 lb (63.5 kg)   Height: 5\' 8"  (1.727 m)    Physical Exam Constitutional:      General: He is not in acute distress.    Appearance: Normal appearance.  Eyes:     General: No scleral icterus.    Conjunctiva/sclera: Conjunctivae normal.  Cardiovascular:     Rate and Rhythm: Normal rate and regular rhythm.  Pulmonary:     Breath sounds: Decreased air movement (right base) present. No wheezing, rhonchi or rales.  Musculoskeletal:     Right lower leg: No edema.     Left lower leg: No edema.  Skin:    General: Skin is warm and dry.  Neurological:     General: No focal deficit present.    CBC    Component Value Date/Time   WBC 16.5 (H) 11/24/2023 0947   WBC 15.4 (H) 11/21/2023 0407   RBC 3.26 (L) 11/24/2023 0947   HGB 9.4 (L) 11/24/2023 0947   HCT 29.5 (L) 11/24/2023 0947   PLT 453 (H) 11/24/2023 0947   MCV 90.5 11/24/2023 0947   MCH 28.8 11/24/2023 0947   MCHC 31.9 11/24/2023 0947   RDW 18.9 (H) 11/24/2023 0947   LYMPHSABS 0.6 (L) 11/24/2023 0947   MONOABS 2.3 (H) 11/24/2023 0947   EOSABS 0.5  11/24/2023 0947   BASOSABS 0.2 (H) 11/24/2023 0947      Latest Ref Rng & Units 11/24/2023    9:47 AM 11/21/2023    4:07 AM 11/20/2023   11:29 AM  BMP  Glucose 70 - 99 mg/dL 161  92  95   BUN 6 - 20 mg/dL 15  9  9    Creatinine 0.61 - 1.24 mg/dL 0.96  0.45  4.09   Sodium 135 - 145 mmol/L 141  136  140   Potassium 3.5 - 5.1 mmol/L 3.6  3.6  3.9   Chloride 98 - 111 mmol/L 102  96  102   CO2 22 - 32 mmol/L 33  29  27   Calcium  8.9 - 10.3 mg/dL 8.9  8.9  8.7    Chest imaging: CXR 11/22/23 Improved aeration of RIGHT lung post thoracentesis without residual pleural effusion. No pneumothorax.  NM PET Scan 10/22/23 1. Bilateral hypermetabolic lymph nodes in the lower neck/upper thoracic inlet consistent with nodal metastasis. 2. Hypermetabolic lymph node inferior to the carina consistent with nodal metastasis. 3. No lung nodules. 4. Large layering RIGHT pleural effusion.  PFT:    Latest Ref Rng & Units 08/18/2018    8:41 AM  PFT Results  FVC-Pre L 3.91   FVC-Predicted Pre % 82   FVC-Post L 4.06   FVC-Predicted Post % 85   Pre FEV1/FVC % % 79   Post FEV1/FCV % % 84   FEV1-Pre L 3.09   FEV1-Predicted Pre % 84   FEV1-Post L 3.39   DLCO uncorrected ml/min/mmHg 26.43   DLCO UNC% % 85   DLVA Predicted % 99   TLC L 5.72   TLC % Predicted % 84   RV % Predicted % 78  Labs:  Path:  Echo:  Heart Catheterization:     Assessment & Plan:   Adenocarcinoma of right lung, stage 4 (HCC) - Plan: Procedural/ Surgical Case Request: THORACENTESIS  Acute respiratory failure with hypoxia (HCC)  Malignant pleural effusion - Plan: Procedural/ Surgical Case Request: THORACENTESIS  Discussion: Brian [name] is a 63 year old male, former smoker with COPD/Emphysema, 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    Pleural  effusion Recurrent with previous drainage. Current ultrasound shows small right-sided fluid pocket. Discussed PlurX catheter risks and monitoring. - Schedule thoracentesis at [facility] next Friday. - Discuss PlurX catheter placement if fluid accumulation increases.  Shortness of breath Likely related to pleural effusion and lung cancer. Discussed potential benefit of nebulizer for improved medication delivery. - Continue Trelegy inhaler, one puff daily. - Set up nebulizer machine and albuterol  solution for use during coughing episodes or increased dyspnea. - Consider albuterol  nebulizer in the morning before Trelegy to optimize lung function.  Lung cancer Contributing to pleural effusion and dyspnea. Potential progression affecting lung function and contributing to lethargy.  Oxygen  therapy Currently on 2 liters continuous oxygen . Discussed potential need for portable oxygen  concentrator.  Malnutrition Due to decreased appetite and intake, likely contributing to weakness and lethargy. Discussed importance of nutritional supplements. - Encourage intake of nutritional supplements like Boost or Ensure if solid food is too much.  Follow up in 2 months  Brian German, MD [facility] Office: [phone]   Current Outpatient Medications:    acetaminophen  (TYLENOL ) 500 MG tablet, Take 1,000 mg by mouth every 6 (six) hours as needed (for pain)., Disp: , Rfl:    albuterol  (VENTOLIN  HFA) 108 (90 Base) MCG/ACT inhaler, INHALE 2 PUFFS INTO THE LUNGS EVERY 4 (FOUR) HOURS AS NEEDED FOR WHEEZING OR SHORTNESS OF BREATH., Disp: 6.7 each, Rfl: 4   amLODipine  (NORVASC ) 5 MG tablet, Take 0.5 tablets (2.5 mg total) by mouth in the morning., Disp: 30 tablet, Rfl: 2   atorvastatin  (LIPITOR) 20 MG tablet, Take 20 mg by mouth in the morning., Disp: , Rfl:    baclofen  (LIORESAL ) 10 MG tablet, Take 10 mg by mouth at bedtime., Disp: , Rfl:    cyclobenzaprine  (FLEXERIL ) 5 MG  tablet, Take 1 tablet (5 mg total) by mouth 3 (three) times daily as needed for muscle spasms., Disp: 30 tablet, Rfl: 0   DULoxetine  (CYMBALTA ) 60 MG capsule, Take 1 capsule (60 mg total) by mouth daily., Disp: 90 capsule, Rfl: 3   fluticasone  (FLONASE ) 50 MCG/ACT nasal spray, Place 1 spray into both nostrils daily., Disp: 1 mL, Rfl: 2   levothyroxine  (SYNTHROID ) 100 MCG tablet, Take 100 mcg by mouth daily before breakfast., Disp: , Rfl:    MIRALAX  17 GM/SCOOP powder, Take 17 g by mouth daily as needed for mild constipation., Disp: , Rfl:    mirtazapine  (REMERON ) 15 MG tablet, TAKE 1 TABLET BY MOUTH EVERYDAY AT BEDTIME, Disp: 90 tablet, Rfl: 1   mupirocin  ointment (BACTROBAN ) 2 %, Apply 1 Application topically See admin instructions. Apply to affected incision sites after a shower- morning and bedtime, Disp: , Rfl:    ondansetron  (ZOFRAN ) 8 MG tablet, Take 1 tablet (8 mg total) by mouth every 8 (eight) hours as needed for nausea or vomiting. (Patient taking differently: Take 8 mg by mouth See admin instructions. Take 8  mg by mouth in the morning and an additional 8 mg up to two times a day as needed for nausea and/or vomiting), Disp: 30 tablet, Rfl: 0   prochlorperazine  (COMPAZINE ) 10 MG tablet, Take 1 tablet (10 mg total) by mouth every 6 (six) hours as needed. (Patient taking differently: Take 10 mg by mouth See admin instructions. Take 10 mg by mouth in the morning and an additional 10 mg up to three times a day as needed for nausea and/or vomiting), Disp: 30 tablet, Rfl: 2   TRELEGY ELLIPTA  100-62.5-25 MCG/INH AEPB, Inhale 1 puff into the lungs in the morning., Disp: , Rfl:    venlafaxine  (EFFEXOR ) 37.5 MG tablet, Take 37.5 mg by mouth in the morning., Disp: , Rfl:

## 2023-12-02 NOTE — Patient Instructions (Addendum)
 We will order you a nebulizer machine and supplies to [employer] in [place]  Use albuterol  nebulizer solution every 4-6 hours as needed - consider using the albuterol  neb 20-30 minutes before taking your trelegy  Continue trelegy ellipta  1 puff daily - rinse mouth out after each use  We can perform thoracentesis as needed for shortness of breath as the fluid builds up  Please let us  know if you would like to move forward with PleurX catheter placement  We will schedule you for a thoracentesis at [facility] Next Friday March 9th  Follow up in 2 months

## 2023-12-07 NOTE — Progress Notes (Unsigned)
 [facility] OFFICE PROGRESS NOTE  [name], MD [address] [place] [address]  DIAGNOSIS: Metastatic non-small cell lung cancer initially diagnosed as stage IIIB (T1c, N3, M0) non-small cell lung cancer, adenocarcinoma presented with right upper lobe lung mass in addition to mediastinal and bilateral supraclavicular lymphadenopathy diagnosed in January 2020.  He had evidence of disease recurrence in June 2021 with adenopathy in the subcarinal, level 3 left neck lymph node, and lymph node at the curious of the diaphragm and in the upper abdomen.   PD-L1: 10%   Guardant 360 molecular studies showed no actionable mutation   Foundation One Testing:   Biomarker Findings Microsatellite status - MS-Stable Tumor Mutational Burden - 4 Muts/Mb Genomic Findings For a complete list of the genes assayed, please refer to the Appendix. ALK EML4-ALK fusion (Variant 2) SMARCB1 R377C CTNNB1 S45P CDKN2A/B CDKN2B loss, CDKN2A loss CXCR4 W106* TP53 splice site 672G>A 7 Disease relevant genes with no reportable alterations: BRAF, EGFR, ERBB2, KRAS, MET, RET, ROS1    PRIOR THERAPY: 1) Concurrent chemoradiation with weekly carboplatin  for AUC of 2 and paclitaxel  45 mg/M2.  Status post 6 cycles.  Last dose was given on October 10, 2018 with stable disease. 2) Radiation treatment to the enlarging right cervical lymph nodes under the care of Dr. Eloise Hake. First treatment 03/06/2019. Last treatment scheduled on 04/13/2019 3) Consolidation treatment with immunotherapy with Imfinzi  10 mg/KG every 2 weeks.  First dose November 17, 2018.  Status post 26 cycles. 4) Systemic chemotherapy with carboplatin  for an AUC of 5, Alimta  500 mg/m2, and Keytruda  200 mg IV every 3 weeks. First dose expected on 02/14/20. Status post 1 cycle.  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 5) SBRT to the left lower lobe lung  nodule. 6) [name] 600 mg p.o. twice daily.  He started the first dose on March 08, 2020.   Status post 44 months of treatment.  Discontinued in March 2025 due to disease progression 7) [name] 100 mg daily.  Discontinued in April 2025 due to severe intolerance due to mental status changes/confusion  8) Radiation to the bilateral cervical lymph nodes under the care of Dr. Jeryl Moris last day expected on 11/17/2023   CURRENT THERAPY: Second opinion at [facility] today at 2:30.   INTERVAL HISTORY: Brian [name] 63 y.o. male returns to the clinic today for a follow-up visit accompanied by his wife. The patient was last seen in the clinic 2 weeks ago.  The patient was found to have evidence of disease progression in March 2025.  Therefore, Dr. Marguerita Shih discontinued his treatment with [name]  and he was started on [name] .  He had nausea and vomiting after 1 dose and he discontinued his medication for 2 weeks.  At his last appointment he was re-started on treatment but unfortunately presented to the hospital secondary to mental status changes with hallucinations from his [name] .  He was also treated for pneumonia.    He was discharged in the hospital on supplemental oxygen . He has been seen by pulmonary medicine who recommended doing a thoracentesis which is scheduled for tomorrow.  Patient is not interested in a Pleurx catheter at this time.  At his last appointment we discussed options. Considering alternative treatments including brigatinib (however Dr. Marguerita Shih discussed concerns with respiratory side effects), reduced dose lorlatinib , and chemotherapy. Referral to [facility] for clinical trials or alternative therapies.  His appointment  with [facility] later today to 30.  Since last being seen, he denies any major changes in his health send for decreased appetite and continued weight loss.  He is currently taking Remeron .. his breathing is 'better with the oxygen ". He is on 3 L of oxygen .   He coughs "now and then".  Denies any chest pain or hemoptysis.  He had 2-3 episodes of nausea and vomiting last week but he forgot that he had antiemetics with Compazine  and Zofran  at home.  Any rashes or skin changes.  He denies any headaches.  He denies any fever or chills.     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  MEDICATIONS:  Current Outpatient Medications  Medication Sig Dispense Refill   acetaminophen  (TYLENOL ) 500 MG tablet Take 1,000 mg by mouth every 6 (six) hours as needed (for pain).     albuterol  (VENTOLIN  HFA) 108 (90 Base) MCG/ACT inhaler INHALE 2 PUFFS INTO THE LUNGS EVERY 4 (FOUR) HOURS AS NEEDED FOR WHEEZING OR SHORTNESS OF BREATH. 6.7 each 4   amLODipine  (NORVASC ) 5 MG tablet Take 0.5 tablets (2.5 mg total) by mouth in the morning. 30 tablet 2   atorvastatin  (LIPITOR) 20 MG tablet Take 20 mg by mouth in the morning.     baclofen  (LIORESAL ) 10 MG tablet Take 10 mg by mouth at bedtime.     cyclobenzaprine  (FLEXERIL ) 5 MG tablet Take 1 tablet (5 mg total) by mouth 3 (three) times daily as needed for muscle spasms. 30 tablet 0   DULoxetine  (CYMBALTA ) 60 MG capsule Take 1 capsule (60 mg total) by mouth daily. 90 capsule 3   fluticasone  (FLONASE ) 50 MCG/ACT nasal spray Place 1 spray into both nostrils  daily. 1 mL 2   levothyroxine  (SYNTHROID ) 100 MCG tablet Take 100 mcg by mouth daily before breakfast.     MIRALAX  17 GM/SCOOP powder Take 17 g by mouth daily as needed for mild constipation.     mirtazapine  (REMERON ) 15 MG tablet TAKE 1 TABLET BY MOUTH EVERYDAY AT BEDTIME 90 tablet 1   mupirocin  ointment (BACTROBAN ) 2 % Apply 1 Application topically See admin instructions. Apply to affected incision sites after a shower- morning and bedtime     ondansetron  (ZOFRAN ) 8 MG tablet Take 1 tablet (8 mg total) by mouth every 8 (eight) hours as needed for nausea or vomiting. (Patient taking differently: Take 8 mg by mouth See admin instructions. Take 8 mg by mouth in the morning and an additional 8 mg up to two times a day as needed for nausea and/or vomiting) 30 tablet 0   prochlorperazine  (COMPAZINE ) 10 MG tablet Take 1 tablet (10 mg total) by mouth every 6 (six) hours as needed. (Patient taking differently: Take 10 mg by  mouth See admin instructions. Take 10 mg by mouth in the morning and an additional 10 mg up to three times a day as needed for nausea and/or vomiting) 30 tablet 2   TRELEGY ELLIPTA  100-62.5-25 MCG/INH AEPB Inhale 1 puff into the lungs in the morning.     venlafaxine  (EFFEXOR ) 37.5 MG tablet Take 37.5 mg by mouth in the morning.     No current facility-administered medications for this visit.    SURGICAL HISTORY:  Past Surgical History:  Procedure Laterality Date   COLONOSCOPY     FEMUR FRACTURE SURGERY Left 1969   INGUINAL HERNIA REPAIR Right 06/24/2020   Procedure: RIGHT OPEN INGUINAL HERNIA REPAIR WITH MESH;  Surgeon: Kinsinger, Alphonso Aschoff, MD;  Location: [facility];  Service: General;  Laterality: Right;   IR IVC FILTER PLMT / S&I Dan Dun GUID/MOD SED  03/04/2020   IR IVC FILTER RETRIEVAL / S&I Dan Dun GUID/MOD SED  08/15/2020   IR RADIOLOGIST EVAL & MGMT  08/06/2020   SPINAL CORD STIMULATOR INSERTION N/A 10/05/2023   Procedure: CERVICAL SPINAL CORD STIMULATOR PLACEMENT;  Surgeon:  Carroll Clamp, MD;  Location: [facility] ORS;  Service: Neurosurgery;  Laterality: N/A;    REVIEW OF SYSTEMS:   Review of Systems  Constitutional: Positive for fatigue, decreased appetite, and weight loss.  Negative for chills and fever.  HENT: Positive for hoarseness. Negative for mouth sores, nosebleeds, sore throat and trouble swallowing.   Eyes: Negative for eye problems and icterus.  Respiratory: Positive for occasional dyspnea on exertion. Negative for hemoptysis and wheezing.   Cardiovascular: Negative for chest pain and leg swelling.  Gastrointestinal:  Nausea and vomiting last week. Negative for abdominal pain and diarrhea.  Genitourinary: Negative for bladder incontinence, difficulty urinating, dysuria, frequency and hematuria.   Musculoskeletal: Negative for back pain, gait problem, neck pain and neck stiffness.  Skin: Negative for itching and rash.  Neurological: Occasional headaches. Negative for dizziness, extremity weakness, gait problem, light-headedness and seizures.  Hematological: Negative for adenopathy. Does not bruise/bleed easily.  Psychiatric/Behavioral: Negative for confusion, depression and sleep disturbance. The patient is not nervous/anxious.      PHYSICAL EXAMINATION:  Blood pressure 107/84, pulse (!) 109, temperature 98.3 F (36.8 C), temperature source Temporal, resp. rate 18, weight 138 lb 12.8 oz (63 kg), SpO2 100%.  ECOG PERFORMANCE STATUS: 2  Physical Exam  Constitutional: Oriented to person, place, and time and well-developed, well-nourished, and in no distress.  HENT:  Head: Normocephalic and atraumatic.  Mouth/Throat: Positive for hoarseness. Oropharynx is clear and moist. No oropharyngeal exudate.  Eyes: Conjunctivae are normal. Right eye exhibits no discharge. Left eye exhibits no discharge. No scleral icterus.  Neck: Normal range of motion. Neck supple.  Cardiovascular: Normal rate, regular rhythm, normal heart sounds and intact distal pulses.    Pulmonary/Chest: Effort normal. Decreased breath sounds in right lower lung. No respiratory distress. No wheezes. No rales.  Abdominal: Soft. Bowel sounds are normal. Exhibits no distension and no mass. There is no tenderness.  Musculoskeletal: Flaccid right upper extremity. Exhibits no edema.  Lymphadenopathy:    Bilateral cervical lymphadenopathy.  Neurological: Alert and oriented to person, place, and time. Exhibits normal muscle tone. Gait normal. Coordination normal.  Skin: Skin is warm and dry. No rash noted. Not diaphoretic. No erythema. No pallor.  Psychiatric: Mood, memory and judgment normal.  Vitals reviewed.    LABORATORY DATA: Lab Results  Component Value Date   WBC 11.7 (H) 12/09/2023   HGB 10.2 (L) 12/09/2023   HCT  32.2 (L) 12/09/2023   MCV 93.3 12/09/2023   PLT 276 12/09/2023      Chemistry      Component Value Date/Time   NA 140 12/09/2023 0759   K 3.5 12/09/2023 0759   CL 98 12/09/2023 0759   CO2 37 (H) 12/09/2023 0759   BUN 10 12/09/2023 0759   CREATININE 0.61 12/09/2023 0759      Component Value Date/Time   CALCIUM  9.1 12/09/2023 0759   ALKPHOS 100 12/09/2023 0759   AST 14 (L) 12/09/2023 0759   ALT 14 12/09/2023 0759   BILITOT 0.6 12/09/2023 0759       RADIOGRAPHIC STUDIES:  DG CHEST PORT 1 VIEW Result Date: 11/22/2023 CLINICAL DATA:  142230 Pleural effusion 142230 EXAM: PORTABLE CHEST 1 VIEW COMPARISON:  Chest XR, 11/22/2023. IR thoracentesis, earlier same day. CT chest, 08/13/2023. FINDINGS: Support lines: Spinal cord stimulator with leads extending off the field of view. Cardiomediastinal silhouette is within normal limits. The LEFT lung is well inflated. Improved aeration of the RIGHT lung post thoracentesis with no residual pleural effusion. RIGHT perihilar pulmonary opacity with trace fluid layering along the minor fissure. No pneumothorax. A RIGHT AC joint degenerative change. No acute osseous abnormality. IMPRESSION: Improved aeration of RIGHT  lung post thoracentesis without residual pleural effusion. No pneumothorax. Electronically Signed   By: Art Largo M.D.   On: 11/22/2023 17:26   US  THORACENTESIS ASP PLEURAL SPACE W/IMG GUIDE Result Date: 11/22/2023 INDICATION: 63 year old male with history of non-small cell lung cancer of right lung, with recurrent pleural effusion. IR requested for therapeutic right thoracentesis. EXAM: ULTRASOUND GUIDED THERAPEUTIC THORACENTESIS MEDICATIONS: 6 mL of 1% lidocaine  COMPLICATIONS: None immediate. PROCEDURE: An ultrasound guided thoracentesis was thoroughly discussed with the patient and questions answered. The benefits, risks, alternatives and complications were also discussed. The patient understands and wishes to proceed with the procedure. Written consent was obtained. Ultrasound was performed to localize and mark an adequate pocket of fluid in the right chest. The area was then prepped and draped in the normal sterile fashion. 1% Lidocaine  was used for local anesthesia. Under ultrasound guidance a 6 Fr Safe-T-Centesis catheter was introduced. Thoracentesis was performed. The catheter was removed and a dressing applied. FINDINGS: A total of approximately 700 cc of clear, dark amber pleural fluid was removed. IMPRESSION: Successful ultrasound guided RIGHT thoracentesis yielding 700 mL of pleural fluid. Procedure performed by Lambert Pillion, PA-C Electronically Signed   By: Art Largo M.D.   On: 11/22/2023 16:06   DG Chest 1 View Result Date: 11/22/2023 CLINICAL DATA:  Hypoxia EXAM: CHEST  1 VIEW COMPARISON:  November 17, 2023 FINDINGS: Worsening moderate right pleural effusion with right basilar atelectasis. Subsegmental atelectasis in the left lung base. No pneumothorax. Ventriculoperitoneal shunt catheter tubing along the left hemithorax. No cardiomegaly. Bilateral AC joint osteoarthritis. IMPRESSION: Worsening moderate right pleural effusion with right basilar atelectasis. Electronically Signed   By:  Rance Burrows M.D.   On: 11/22/2023 12:34   VAS US  LOWER EXTREMITY VENOUS (DVT) Result Date: 11/17/2023  Lower Venous DVT Study Patient Name:  Brian [name]  Date of Exam:   11/17/2023 Medical Rec #: [id number]         Accession #:    [id number] Date of Birth: 1961-07-31        Patient Gender: M Patient Age:   3 years Exam Location:  [facility] Procedure:      VAS US  LOWER EXTREMITY VENOUS (DVT) Referring Phys: [facility] GOEL --------------------------------------------------------------------------------  Indications: Swelling,  Edema, and SOB. Other Indications: H/O DVT and SVT, throat cancer. Comparison Study: Previous study on 1.4.2022. Performing Technologist: Ria Chad  Examination Guidelines: A complete evaluation includes B-mode imaging, spectral Doppler, color Doppler, and power Doppler as needed of all accessible portions of each vessel. Bilateral testing is considered an integral part of a complete examination. Limited examinations for reoccurring indications may be performed as noted. The reflux portion of the exam is performed with the patient in reverse Trendelenburg.  +---------+---------------+---------+-----------+----------+--------------+ RIGHT    CompressibilityPhasicitySpontaneityPropertiesThrombus Aging +---------+---------------+---------+-----------+----------+--------------+ CFV      Full           Yes      Yes                                 +---------+---------------+---------+-----------+----------+--------------+ SFJ      Full           Yes      Yes                                 +---------+---------------+---------+-----------+----------+--------------+ FV Prox  Full                                                        +---------+---------------+---------+-----------+----------+--------------+ FV Mid   Full                                                         +---------+---------------+---------+-----------+----------+--------------+ FV DistalFull                                                        +---------+---------------+---------+-----------+----------+--------------+ PFV      Full                                                        +---------+---------------+---------+-----------+----------+--------------+ POP      Full           Yes      Yes                                 +---------+---------------+---------+-----------+----------+--------------+ PTV      Full                                                        +---------+---------------+---------+-----------+----------+--------------+ PERO     Full                                                        +---------+---------------+---------+-----------+----------+--------------+   +---------+---------------+---------+-----------+----------+--------------+  LEFT     CompressibilityPhasicitySpontaneityPropertiesThrombus Aging +---------+---------------+---------+-----------+----------+--------------+ CFV      Full           Yes      Yes                                 +---------+---------------+---------+-----------+----------+--------------+ SFJ      Full           Yes      Yes                                 +---------+---------------+---------+-----------+----------+--------------+ FV Prox  Full                                                        +---------+---------------+---------+-----------+----------+--------------+ FV Mid   Full                                                        +---------+---------------+---------+-----------+----------+--------------+ FV DistalFull                                                        +---------+---------------+---------+-----------+----------+--------------+ PFV      Full                                                         +---------+---------------+---------+-----------+----------+--------------+ POP      Full           Yes      Yes                                 +---------+---------------+---------+-----------+----------+--------------+ PTV      Full                                                        +---------+---------------+---------+-----------+----------+--------------+ PERO     Full                                                        +---------+---------------+---------+-----------+----------+--------------+ GSV      Partial        No       No                                  +---------+---------------+---------+-----------+----------+--------------+  A small segment of the GSV at the middle calf level is thrombosed and chronic.    Summary: RIGHT: - There is no evidence of deep vein thrombosis in the lower extremity.  - No cystic structure found in the popliteal fossa.   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  - There is no evidence of deep vein thrombosis in the lower extremity.  - No cystic structure found in the popliteal fossa.   *See table(s) above for measurements and observations. Electronically signed by Irvin Mantel on 11/17/2023 at 8:04:36 PM.    Final    DG Chest 1 View Result Date: 11/17/2023 CLINICAL DATA:  [id number] S/P thoracentesis [id number] EXAM: PORTABLE CHEST 1 VIEW COMPARISON:  Chest XR, 11/15/2023.  CT chest, 08/13/2023. FINDINGS: Support lines: Spinal cord stimulator, with leads extending outside field of view. Cardiomediastinal silhouette is within normal limits. The LEFT lung is well inflated. Improved aeration of the RIGHT lung post thoracentesis with small volume residual pleural effusion. RIGHT perihilar pulmonary opacity with trace residual fluid layering along the minor fissure. No pneumothorax. RIGHT AC joint degenerative change. No acute osseous abnormality. IMPRESSION: Improved aeration of  the RIGHT lung post thoracentesis with small volume residual pleural effusion. No pneumothorax. Electronically Signed   By: Art Largo M.D.   On: 11/17/2023 16:54   US  THORACENTESIS ASP PLEURAL SPACE W/IMG GUIDE Result Date: 11/17/2023 INDICATION: Patient with history of non-small cell lung cancer of right lung, recurrent pleural effusion. IR consulted for diagnostic and therapeutic right thoracentesis. EXAM: ULTRASOUND GUIDED DIAGNOSTIC AND THERAPEUTIC RIGHT THORACENTESIS MEDICATIONS: 10 mL 1% lidocaine  COMPLICATIONS: None immediate. PROCEDURE: An ultrasound guided thoracentesis was thoroughly discussed with the patient and questions answered. The benefits, risks, alternatives and complications were also discussed. The patient understands and wishes to proceed with the procedure. Written consent was obtained. Ultrasound was performed to localize and mark an adequate pocket of fluid in the right chest. The area was then prepped and draped in the normal sterile fashion. 1% Lidocaine  was used for local anesthesia. Under ultrasound guidance a 6 Fr Safe-T-Centesis catheter was introduced. Thoracentesis was performed. The catheter was removed and a dressing applied. FINDINGS: A total of approximately 820 mL of yellow fluid was removed. Samples were sent to the laboratory as requested by the clinical team. IMPRESSION: Successful ultrasound guided diagnostic and therapeutic RIGHT thoracentesis yielding 820 mL of pleural fluid. Performed by: Wyatt Pommier, PA-C Electronically Signed   By: Art Largo M.D.   On: 11/17/2023 16:41   DG Chest 1 View Result Date: 11/16/2023 CLINICAL DATA:  confusion, cancer history EXAM: CHEST  1 VIEW COMPARISON:  08/27/2023 FINDINGS: Interval development of moderate right pleural effusion, with atelectasis/consolidation the right lung base. There is some linear right suprahilar scarring or atelectasis. Left lung clear. Heart size and mediastinal contours are within normal limits. Small  caliber catheter placement through the left neck, chest, and upper abdomen. Visualized bones unremarkable. IMPRESSION: Moderate right pleural effusion with right basilar atelectasis/consolidation. Electronically Signed   By: Nicoletta Barrier M.D.   On: 11/16/2023 17:56   CT HEAD WO CONTRAST Result Date: 11/16/2023 CLINICAL DATA:  Mental status change of unknown cause. Head and neck cancer being treated with radiation. EXAM: CT HEAD WITHOUT CONTRAST TECHNIQUE: Contiguous axial images were obtained from the base of the skull through the vertex without intravenous contrast. RADIATION DOSE REDUCTION: This exam was performed according to the departmental dose-optimization program which includes automated exposure control,  adjustment of the mA and/or kV according to patient size and/or use of iterative reconstruction technique. COMPARISON:  10/15/2022 FINDINGS: Brain: The brain shows a normal appearance without evidence of malformation, atrophy, old or acute small or large vessel infarction, mass lesion, hemorrhage, hydrocephalus or extra-axial collection. Vascular: No hyperdense vessel. No evidence of atherosclerotic calcification. Skull: Normal.  No traumatic finding.  No focal bone lesion. Sinuses/Orbits: Sinuses are clear. Orbits appear normal. Mastoids are clear. Other: Upper cervical neurostimulator in place. IMPRESSION: Normal head CT. Upper cervical neurostimulator in place. Electronically Signed   By: Bettylou Brunner M.D.   On: 11/16/2023 16:21     ASSESSMENT/PLAN:  This is a very pleasant 63 year old Caucasian male with recurrent lung cancer initially diagnosed with stage IIIb non-small cell lung cancer, adenocarcinoma.  He presented with a right upper lobe lung mass in addition to mediastinal and bilateral supraclavicular lymphadenopathy.  He was diagnosed in January 2020.  His PDL 1 expression is 10% and he has no actionable mutations.    He underwent a course of concurrent chemoradiation with weekly  carboplatin  and paclitaxel . He is status post 6 cycles.  He tolerated well except for fatigue and odynophagia.    He completed his 26 cycles of consolidation immunotherapy with Imfinzi  10 mg/kg IV every 2 weeks. He tolerated it well without any adverse side effects except has a very mild skin rash over his right cervical area. He completed radiation to the enlarging right cervical lymph nodes. His last radiation treatment was on 04/13/2019.   He received 1 cycle of systemic chemotherapy with carboplatin , Alimta  and Keytruda . Repeat tissue biopsy from the left supraclavicular lymph nodes and molecular studies showed that the patient has positive ALK gene translocation. His previous molecular studies by Guardant 360 was negative. Dr. Marguerita Shih discontinue his systemic chemotherapy for now because of the new findings on the molecular studies.    The patient has been on treatment with [name]  600 mg p.o. twice daily.  He is status post 43 months of treatment.    He unfortunately was recently found to have evidence of disease progression and his treatment was switched to 100 mg of [name]  p.o. daily.  He started this on 10/20/2023.    He completed palliative radiation to the cervical lymph nodes on 11/17/23.   The patient had significant intolerance to [name]  and he was recently hospitalized with hallucinations. He was discharged on 11/22/23.   At his last appointment, we discussed options with brigatinib (however Dr. Marguerita Shih discussed concerns with respiratory side effects), reduced dose lorlatinib , and chemotherapy.  The patient's appointment with [facility] is later today.  Dr. Marguerita Shih recommends waiting until the patient sees [facility] before making a decision.  I will arrange for repeat appointment and labs next week to review his options and make arrangements at this time.  The patient saw pulmonary medicine and he is getting a thoracentesis tomorrow.  He is not interested in a Pleurx at this time.  He  will continue taking Remeron  for his decreased appetite.  We discussed salt water  rinses, Biotene, baking soda rinses.  He is still on 3 L of supplemental oxygen .  He was instructed to use his Zofran  and Compazine  if needed for nausea and vomiting.  The patient was advised to call immediately if he has any concerning symptoms in the interval. The patient voices understanding of current disease status and treatment options and is in agreement with the current care plan. All questions were answered. The patient knows to call the  clinic with any problems, questions or concerns. We can certainly see the patient much sooner if necessary   Orders Placed This Encounter  Procedures   CBC with Differential (Cancer Center Only)    Standing Status:   Future    Expected Date:   12/16/2023    Expiration Date:   12/08/2024   CMP (Cancer Center only)    Standing Status:   Future    Expected Date:   12/16/2023    Expiration Date:   12/08/2024     The total time spent in the appointment was 20-29 minutes  Clary Meeker L Lorissa Kishbaugh, PA-C 12/09/23

## 2023-12-08 ENCOUNTER — Telehealth: Payer: Self-pay

## 2023-12-08 ENCOUNTER — Telehealth: Payer: Self-pay | Admitting: *Deleted

## 2023-12-08 NOTE — Telephone Encounter (Signed)
 12/03/2023  "Joy HR Generalist with [employer] [phone]).  We submitted an attending Physician Statement for Dr, Marguerita Shih to complete.  Has it already been completed.  I need a copy of it.to send to [facility].  Please call me back.  My cell number is [phone].  Advised form has not been started.  Joy asked "when form will be returned.  Advised provider returns to office next week.  Two forms are ahead of this.  Estimated next week.  Noted due date of 5/9 on forms tracker.  No further questions or concerns.Aaron Aas

## 2023-12-08 NOTE — Telephone Encounter (Signed)
 12/08/2023 0832 received call.  " Purvis Bruckner ([phone]).  Calling regarding Yasseen Grosz Andrus's disability extension request.  [employer] needs to receive this in order for him to get paid.  Current disability ends 12/10/2023." Advised this nurse will notify form nurse acting upon this form.  She has not arrived in the office at this time.

## 2023-12-08 NOTE — Telephone Encounter (Addendum)
 Left message on Kathy's phone [phone] to call and reschedule the postop with Dr.Smith.

## 2023-12-08 NOTE — Telephone Encounter (Signed)
 Brian [name] called back and confirmed appt for 12/13/2023 and Nils Baseman has been notified.

## 2023-12-08 NOTE — Telephone Encounter (Signed)
 12/07/2023: Voicemail from [employer], [employer] [phone]).  "Calling regarding Brian [name], claim number: [id number].  We spoke last Friday regarding disability form attending physician you said Dr. Marguerita Shih will complete this week.  Call me back as soon as he completes it because [employer]'s FMLA is only good through May 9th.  Want to ensure it is completed.  My cell number is [phone]. Thank you, appreciate your help" .

## 2023-12-08 NOTE — Telephone Encounter (Signed)
 Attempted to return phone call to Joy from HR of Patient's Employer. Unable to reach her, and unable to leave a voicemail. Disability Attending Physician Form completed and signed and faxed to [facility] at [phone]. Fax transmission confirmation received. Notified Spouse and requested signed Release of Information Form. No other needs or concerns noted at this time.

## 2023-12-09 ENCOUNTER — Telehealth: Payer: Self-pay | Admitting: Internal Medicine

## 2023-12-09 ENCOUNTER — Inpatient Hospital Stay: Attending: Internal Medicine

## 2023-12-09 ENCOUNTER — Inpatient Hospital Stay: Admitting: Physician Assistant

## 2023-12-09 ENCOUNTER — Telehealth: Payer: Self-pay | Admitting: *Deleted

## 2023-12-09 VITALS — BP 107/84 | HR 109 | Temp 98.3°F | Resp 18 | Wt 138.8 lb

## 2023-12-09 DIAGNOSIS — Z923 Personal history of irradiation: Secondary | ICD-10-CM | POA: Diagnosis not present

## 2023-12-09 DIAGNOSIS — C3411 Malignant neoplasm of upper lobe, right bronchus or lung: Secondary | ICD-10-CM | POA: Diagnosis present

## 2023-12-09 DIAGNOSIS — C3491 Malignant neoplasm of unspecified part of right bronchus or lung: Secondary | ICD-10-CM | POA: Diagnosis not present

## 2023-12-09 DIAGNOSIS — C7931 Secondary malignant neoplasm of brain: Secondary | ICD-10-CM | POA: Diagnosis not present

## 2023-12-09 DIAGNOSIS — C770 Secondary and unspecified malignant neoplasm of lymph nodes of head, face and neck: Secondary | ICD-10-CM | POA: Diagnosis not present

## 2023-12-09 DIAGNOSIS — Z5112 Encounter for antineoplastic immunotherapy: Secondary | ICD-10-CM | POA: Diagnosis not present

## 2023-12-09 DIAGNOSIS — Z5111 Encounter for antineoplastic chemotherapy: Secondary | ICD-10-CM | POA: Diagnosis present

## 2023-12-09 DIAGNOSIS — Z79899 Other long term (current) drug therapy: Secondary | ICD-10-CM | POA: Insufficient documentation

## 2023-12-09 DIAGNOSIS — C3490 Malignant neoplasm of unspecified part of unspecified bronchus or lung: Secondary | ICD-10-CM | POA: Diagnosis not present

## 2023-12-09 LAB — CMP (CANCER CENTER ONLY)
ALT: 14 U/L (ref 0–44)
AST: 14 U/L — ABNORMAL LOW (ref 15–41)
Albumin: 3.4 g/dL — ABNORMAL LOW (ref 3.5–5.0)
Alkaline Phosphatase: 100 U/L (ref 38–126)
Anion gap: 5 (ref 5–15)
BUN: 10 mg/dL (ref 6–20)
CO2: 37 mmol/L — ABNORMAL HIGH (ref 22–32)
Calcium: 9.1 mg/dL (ref 8.9–10.3)
Chloride: 98 mmol/L (ref 98–111)
Creatinine: 0.61 mg/dL (ref 0.61–1.24)
GFR, Estimated: >60 mL/min (ref >60)
Glucose, Bld: 136 mg/dL — ABNORMAL HIGH (ref 70–99)
Potassium: 3.5 mmol/L (ref 3.5–5.1)
Sodium: 140 mmol/L (ref 135–145)
Total Bilirubin: 0.6 mg/dL (ref 0.0–1.2)
Total Protein: 6.5 g/dL (ref 6.5–8.1)

## 2023-12-09 LAB — CBC WITH DIFFERENTIAL (CANCER CENTER ONLY)
Abs Immature Granulocytes: 0.04 10*3/uL (ref 0.00–0.07)
Basophils Absolute: 0.1 10*3/uL (ref 0.0–0.1)
Basophils Relative: 1 %
Eosinophils Absolute: 0.3 10*3/uL (ref 0.0–0.5)
Eosinophils Relative: 2 %
HCT: 32.2 % — ABNORMAL LOW (ref 39.0–52.0)
Hemoglobin: 10.2 g/dL — ABNORMAL LOW (ref 13.0–17.0)
Immature Granulocytes: 0 %
Lymphocytes Relative: 3 %
Lymphs Abs: 0.4 10*3/uL — ABNORMAL LOW (ref 0.7–4.0)
MCH: 29.6 pg (ref 26.0–34.0)
MCHC: 31.7 g/dL (ref 30.0–36.0)
MCV: 93.3 fL (ref 80.0–100.0)
Monocytes Absolute: 1.3 10*3/uL — ABNORMAL HIGH (ref 0.1–1.0)
Monocytes Relative: 11 %
Neutro Abs: 9.7 10*3/uL — ABNORMAL HIGH (ref 1.7–7.7)
Neutrophils Relative %: 83 %
Platelet Count: 276 10*3/uL (ref 150–400)
RBC: 3.45 MIL/uL — ABNORMAL LOW (ref 4.22–5.81)
RDW: 16.4 % — ABNORMAL HIGH (ref 11.5–15.5)
WBC Count: 11.7 10*3/uL — ABNORMAL HIGH (ref 4.0–10.5)
nRBC: 0 % (ref 0.0–0.2)

## 2023-12-09 LAB — LIPID PANEL
Cholesterol: 199 mg/dL (ref 0–200)
HDL: 39 mg/dL — ABNORMAL LOW (ref >40)
LDL Cholesterol: 140 mg/dL — ABNORMAL HIGH (ref 0–99)
Total CHOL/HDL Ratio: 5.1 ratio
Triglycerides: 99 mg/dL (ref <150)
VLDL: 20 mg/dL (ref 0–40)

## 2023-12-09 NOTE — Telephone Encounter (Signed)
 Left the patient a voicemail with the scheduled appointment details.

## 2023-12-09 NOTE — Telephone Encounter (Signed)
"  [employer] [phone]).  I'm sorry to keep calling you but I am trying to help Mathilda Solum.  We know he is unable to return to work.  He will not be able to be paid and will have to begin this process over from the beginning.  Current disability expires 12/10/2023.  We need the form faxed to us  and we will submit it to [facility].  Our fax number is [phone]"  (Tracker reads completion on 12/08/2023).Send a copy to us .   Connected with [facility]  who reported "everything to be scanned was sent earlier.  Unable to obtain a copy and we do not make or have copies.  Please notify the forms team members.". Advised no need to apologize for calling.  Connected with our records staff, unfortunately there is nothing available to send.  Provided Joy the other forms nurse phone number who completed form on 12/08/2023.  This nurse will be out of office after today through 12/20/2023.  [facility] may route [employer] Disability extension to [employer]" when form is scanned to the EMR.  Expected to be available 12/10/2023.  Try reaching out to [employer].  I understand the delay of up to 72-hours before their receipt of form is logged in their system.  No further questions or needs.

## 2023-12-10 ENCOUNTER — Encounter: Payer: Self-pay | Admitting: Pulmonary Disease

## 2023-12-10 ENCOUNTER — Ambulatory Visit (HOSPITAL_COMMUNITY)
Admission: RE | Admit: 2023-12-10 | Discharge: 2023-12-10 | Disposition: A | Discharge status: Home / Self Care | Attending: Pulmonary Disease | Admitting: Pulmonary Disease

## 2023-12-10 ENCOUNTER — Other Ambulatory Visit: Payer: Self-pay | Admitting: Physician Assistant

## 2023-12-10 ENCOUNTER — Ambulatory Visit (HOSPITAL_COMMUNITY)

## 2023-12-10 ENCOUNTER — Telehealth: Payer: Self-pay

## 2023-12-10 ENCOUNTER — Encounter (HOSPITAL_COMMUNITY)
Admission: RE | Disposition: A | Discharge status: Home / Self Care | Payer: Self-pay | Source: Home / Self Care | Attending: Pulmonary Disease

## 2023-12-10 DIAGNOSIS — Z87891 Personal history of nicotine dependence: Secondary | ICD-10-CM | POA: Diagnosis not present

## 2023-12-10 DIAGNOSIS — C770 Secondary and unspecified malignant neoplasm of lymph nodes of head, face and neck: Secondary | ICD-10-CM | POA: Insufficient documentation

## 2023-12-10 DIAGNOSIS — C782 Secondary malignant neoplasm of pleura: Secondary | ICD-10-CM | POA: Insufficient documentation

## 2023-12-10 DIAGNOSIS — J910 Malignant pleural effusion: Secondary | ICD-10-CM | POA: Insufficient documentation

## 2023-12-10 DIAGNOSIS — J432 Centrilobular emphysema: Secondary | ICD-10-CM | POA: Insufficient documentation

## 2023-12-10 DIAGNOSIS — Z9981 Dependence on supplemental oxygen: Secondary | ICD-10-CM | POA: Diagnosis not present

## 2023-12-10 DIAGNOSIS — J90 Pleural effusion, not elsewhere classified: Secondary | ICD-10-CM

## 2023-12-10 DIAGNOSIS — C771 Secondary and unspecified malignant neoplasm of intrathoracic lymph nodes: Secondary | ICD-10-CM | POA: Insufficient documentation

## 2023-12-10 DIAGNOSIS — C3491 Malignant neoplasm of unspecified part of right bronchus or lung: Secondary | ICD-10-CM | POA: Insufficient documentation

## 2023-12-10 DIAGNOSIS — E46 Unspecified protein-calorie malnutrition: Secondary | ICD-10-CM | POA: Insufficient documentation

## 2023-12-10 DIAGNOSIS — J9601 Acute respiratory failure with hypoxia: Secondary | ICD-10-CM | POA: Insufficient documentation

## 2023-12-10 DIAGNOSIS — Z48813 Encounter for surgical aftercare following surgery on the respiratory system: Secondary | ICD-10-CM | POA: Diagnosis not present

## 2023-12-10 HISTORY — PX: THORACENTESIS: SHX235

## 2023-12-10 SURGERY — THORACENTESIS
Anesthesia: Topical | Laterality: Right

## 2023-12-10 NOTE — Interval H&P Note (Signed)
 History and Physical Interval Note:  12/10/2023 2:27 PM  Brian [name]  has presented today for surgery, with the diagnosis of malignant pleural effusion.  The various methods of treatment have been discussed with the patient and family. After consideration of risks, benefits and other options for treatment, the patient has consented to  Procedure(s): THORACENTESIS (Right) as a surgical intervention.  The patient's history has been reviewed, patient examined, no change in status, stable for surgery.  I have reviewed the patient's chart and labs.  Questions were answered to the patient's satisfaction.     [name]

## 2023-12-10 NOTE — Telephone Encounter (Signed)
 Spoke with Patient's Spouse, Thersia Flax regarding Patient's Disability Forms and the phone calls from his HR Representative Joy. Thersia Flax stated that she picked up the copy of the forms and that she would get them to [employer]. She stated that she had spoken with Joy on numerous occasions and that Domenic had not requested that the forms be sent to his Catering manager, that [employer] would send the HR Department a copy. No other needs or concerns noted at this time.

## 2023-12-10 NOTE — Progress Notes (Unsigned)
 [facility] OFFICE PROGRESS NOTE  [name], MD [address] [place] [address]  DIAGNOSIS:  Metastatic non-small cell lung cancer initially diagnosed as stage IIIB (T1c, N3, M0) non-small cell lung cancer, adenocarcinoma presented with right upper lobe lung mass in addition to mediastinal and bilateral supraclavicular lymphadenopathy diagnosed in January 2020.  He had evidence of disease recurrence in June 2021 with adenopathy in the subcarinal, level 3 left neck lymph node, and lymph node at the curious of the diaphragm and in the upper abdomen.   PD-L1: 10%   Guardant 360 molecular studies showed no actionable mutation   Foundation One Testing:   Biomarker Findings Microsatellite status - MS-Stable Tumor Mutational Burden - 4 Muts/Mb Genomic Findings For a complete list of the genes assayed, please refer to the Appendix. ALK EML4-ALK fusion (Variant 2) SMARCB1 R377C CTNNB1 S45P CDKN2A/B CDKN2B loss, CDKN2A loss CXCR4 W106* TP53 splice site 672G>A 7 Disease relevant genes with no reportable alterations: BRAF, EGFR, ERBB2, KRAS, MET, RET, ROS1  PRIOR THERAPY: 1) Concurrent chemoradiation with weekly carboplatin  for AUC of 2 and paclitaxel  45 mg/M2.  Status post 6 cycles.  Last dose was given on October 10, 2018 with stable disease. 2) Radiation treatment to the enlarging right cervical lymph nodes under the care of Dr. Eloise Hake. First treatment 03/06/2019. Last treatment scheduled on 04/13/2019 3) Consolidation treatment with immunotherapy with Imfinzi  10 mg/KG every 2 weeks.  First dose November 17, 2018.  Status post 26 cycles. 4) Systemic chemotherapy with carboplatin  for an AUC of 5, Alimta  500 mg/m2, and Keytruda  200 mg IV every 3 weeks. First dose expected on 02/14/20. Status post 1 cycle.  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 5) SBRT to the left lower lobe lung  nodule. 6) [name] 600 mg p.o. twice daily.  He started the first dose on March 08, 2020.   Status post 44 months of treatment.  Discontinued in March 2025 due to disease progression 7) [name] 100 mg daily.  Discontinued in April 2025 due to severe intolerance due to mental status changes/confusion  8) Radiation to the bilateral cervical lymph nodes under the care of Dr. Jeryl Moris last day expected on 11/17/2023   CURRENT THERAPY: Balta systemic chemotherapy with carboplatin  for an AUC of 5, Alimta  500 mg/m, and [name]***V every 3 weeks.  First dose expected on 12/23/2023  INTERVAL HISTORY: Brian [name] 63 y.o. male returns to the clinic today for a follow-up visit accompanied by his wife. The patient was last seen in the clinic 2 weeks ago.  The patient was found to have evidence of disease progression in March 2025.  Therefore, Dr. Marguerita Shih discontinued his treatment with [name]  and he was started on [name] .  He had nausea and vomiting after 1 dose and he discontinued his medication for 2 weeks.  At his last appointment he was re-started on treatment but unfortunately presented to the hospital secondary to mental status changes with hallucinations from his [name] .  He was also treated for pneumonia.     He was discharged in the hospital on supplemental oxygen . He has been seen by pulmonary medicine who recommended doing a thoracentesis which is scheduled for tomorrow.  Patient is not interested in a Pleurx catheter at this time.   At his last appointment we discussed options. Considering alternative treatments including brigatinib (however Dr. Marguerita Shih discussed concerns with respiratory side effects),  reduced dose lorlatinib , and chemotherapy. Referral to [facility] for clinical trials or alternative therapies.  [facility] recommended palliative systemic chemotherapy   Since last being seen, he denies any major changes in his health send for decreased appetite and continued  weight loss.  He is currently taking Remeron .. his breathing is 'better with the oxygen ". He is on 3 L of oxygen .  He coughs "now and then".  Denies any chest pain or hemoptysis.  He had 2-3 episodes of nausea and vomiting last week but he forgot that he had antiemetics with Compazine  and Zofran  at home.  Any rashes or skin changes.  He denies any headaches.  He denies any fever or chills.  He is here for evaluation today and to have a more detailed discussion about his current condition and treatment options.      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  MEDICATIONS:  Current Outpatient Medications  Medication Sig Dispense Refill   acetaminophen  (TYLENOL ) 500 MG tablet Take 1,000 mg by mouth every 6 (six) hours as needed (for pain).     albuterol  (VENTOLIN  HFA) 108 (90 Base) MCG/ACT inhaler INHALE 2 PUFFS INTO THE LUNGS EVERY 4 (FOUR) HOURS AS NEEDED FOR WHEEZING OR SHORTNESS OF BREATH. 6.7 each 4   amLODipine  (NORVASC ) 5 MG tablet Take 0.5 tablets (2.5 mg total) by mouth in the morning. 30 tablet 2   atorvastatin  (LIPITOR) 20 MG tablet Take 20 mg by mouth in the morning.     baclofen  (LIORESAL ) 10 MG tablet Take 10 mg by mouth at bedtime.     cyclobenzaprine  (FLEXERIL ) 5 MG tablet Take 1 tablet (5 mg total) by mouth 3  (three) times daily as needed for muscle spasms. 30 tablet 0   DULoxetine  (CYMBALTA ) 60 MG capsule Take 1 capsule (60 mg total) by mouth daily. 90 capsule 3   fluticasone  (FLONASE ) 50 MCG/ACT nasal spray Place 1 spray into both nostrils daily. 1 mL 2   levothyroxine  (SYNTHROID ) 100 MCG tablet Take 100 mcg by mouth daily before breakfast.     MIRALAX  17 GM/SCOOP powder Take 17 g by mouth daily as needed for mild constipation.     mirtazapine  (REMERON ) 15 MG tablet TAKE 1 TABLET BY MOUTH EVERYDAY AT BEDTIME 90 tablet 1   mupirocin  ointment (BACTROBAN ) 2 % Apply 1 Application topically See admin instructions. Apply to affected incision sites after a shower- morning and bedtime     ondansetron  (ZOFRAN ) 8 MG tablet Take 1 tablet (8 mg total) by mouth every 8 (eight) hours as needed for nausea or vomiting. (Patient taking differently: Take 8 mg by mouth See admin instructions. Take 8 mg by mouth in the morning and an additional 8 mg up to two times a day  as needed for nausea and/or vomiting) 30 tablet 0   prochlorperazine  (COMPAZINE ) 10 MG tablet Take 1 tablet (10 mg total) by mouth every 6 (six) hours as needed. (Patient taking differently: Take 10 mg by mouth See admin instructions. Take 10 mg by mouth in the morning and an additional 10 mg up to three times a day as needed for nausea and/or vomiting) 30 tablet 2   TRELEGY ELLIPTA  100-62.5-25 MCG/INH AEPB Inhale 1 puff into the lungs in the morning.     venlafaxine  (EFFEXOR ) 37.5 MG tablet Take 37.5 mg by mouth in the morning.     No current facility-administered medications for this visit.    SURGICAL HISTORY:  Past Surgical History:  Procedure Laterality Date   COLONOSCOPY     FEMUR FRACTURE SURGERY Left 1969   INGUINAL HERNIA REPAIR Right 06/24/2020   Procedure: RIGHT OPEN INGUINAL HERNIA REPAIR WITH MESH;  Surgeon: Kinsinger, Alphonso Aschoff, MD;  Location: [facility];  Service: General;  Laterality: Right;   IR IVC FILTER PLMT / S&I Dan Dun GUID/MOD SED   03/04/2020   IR IVC FILTER RETRIEVAL / S&I Dan Dun GUID/MOD SED  08/15/2020   IR RADIOLOGIST EVAL & MGMT  08/06/2020   SPINAL CORD STIMULATOR INSERTION N/A 10/05/2023   Procedure: CERVICAL SPINAL CORD STIMULATOR PLACEMENT;  Surgeon: Carroll Clamp, MD;  Location: [facility] ORS;  Service: Neurosurgery;  Laterality: N/A;    REVIEW OF SYSTEMS:   Review of Systems  Constitutional: Negative for appetite change, chills, fatigue, fever and unexpected weight change.  HENT:   Negative for mouth sores, nosebleeds, sore throat and trouble swallowing.   Eyes: Negative for eye problems and icterus.  Respiratory: Negative for cough, hemoptysis, shortness of breath and wheezing.   Cardiovascular: Negative for chest pain and leg swelling.  Gastrointestinal: Negative for abdominal pain, constipation, diarrhea, nausea and vomiting.  Genitourinary: Negative for bladder incontinence, difficulty urinating, dysuria, frequency and hematuria.   Musculoskeletal: Negative for back pain, gait problem, neck pain and neck stiffness.  Skin: Negative for itching and rash.  Neurological: Negative for dizziness, extremity weakness, gait problem, headaches, light-headedness and seizures.  Hematological: Negative for adenopathy. Does not bruise/bleed easily.  Psychiatric/Behavioral: Negative for confusion, depression and sleep disturbance. The patient is not nervous/anxious.     PHYSICAL EXAMINATION:  There were no vitals taken for this visit.  ECOG PERFORMANCE STATUS: {CHL ONC ECOG [id number]  Physical Exam  Constitutional: Oriented to person, place, and time and well-developed, well-nourished, and in no distress. No distress.  HENT:  Head: Normocephalic and atraumatic.  Mouth/Throat: Oropharynx is clear and moist. No oropharyngeal exudate.  Eyes: Conjunctivae are normal. Right eye exhibits no discharge. Left eye exhibits no discharge. No scleral icterus.  Neck: Normal range of motion. Neck supple.  Cardiovascular:  Normal rate, regular rhythm, normal heart sounds and intact distal pulses.   Pulmonary/Chest: Effort normal and breath sounds normal. No respiratory distress. No wheezes. No rales.  Abdominal: Soft. Bowel sounds are normal. Exhibits no distension and no mass. There is no tenderness.  Musculoskeletal: Normal range of motion. Exhibits no edema.  Lymphadenopathy:    No cervical adenopathy.  Neurological: Alert and oriented to person, place, and time. Exhibits normal muscle tone. Gait normal. Coordination normal.  Skin: Skin is warm and dry. No rash noted. Not diaphoretic. No erythema. No pallor.  Psychiatric: Mood, memory and judgment normal.  Vitals reviewed.  LABORATORY DATA: Lab Results  Component Value Date   WBC 11.7 (H) 12/09/2023   HGB 10.2 (  L) 12/09/2023   HCT 32.2 (L) 12/09/2023   MCV 93.3 12/09/2023   PLT 276 12/09/2023      Chemistry      Component Value Date/Time   NA 140 12/09/2023 0759   K 3.5 12/09/2023 0759   CL 98 12/09/2023 0759   CO2 37 (H) 12/09/2023 0759   BUN 10 12/09/2023 0759   CREATININE 0.61 12/09/2023 0759      Component Value Date/Time   CALCIUM  9.1 12/09/2023 0759   ALKPHOS 100 12/09/2023 0759   AST 14 (L) 12/09/2023 0759   ALT 14 12/09/2023 0759   BILITOT 0.6 12/09/2023 0759       RADIOGRAPHIC STUDIES:  DG CHEST PORT 1 VIEW Result Date: 11/22/2023 CLINICAL DATA:  142230 Pleural effusion 142230 EXAM: PORTABLE CHEST 1 VIEW COMPARISON:  Chest XR, 11/22/2023. IR thoracentesis, earlier same day. CT chest, 08/13/2023. FINDINGS: Support lines: Spinal cord stimulator with leads extending off the field of view. Cardiomediastinal silhouette is within normal limits. The LEFT lung is well inflated. Improved aeration of the RIGHT lung post thoracentesis with no residual pleural effusion. RIGHT perihilar pulmonary opacity with trace fluid layering along the minor fissure. No pneumothorax. A RIGHT AC joint degenerative change. No acute osseous abnormality.  IMPRESSION: Improved aeration of RIGHT lung post thoracentesis without residual pleural effusion. No pneumothorax. Electronically Signed   By: Art Largo M.D.   On: 11/22/2023 17:26   US  THORACENTESIS ASP PLEURAL SPACE W/IMG GUIDE Result Date: 11/22/2023 INDICATION: 63 year old male with history of non-small cell lung cancer of right lung, with recurrent pleural effusion. IR requested for therapeutic right thoracentesis. EXAM: ULTRASOUND GUIDED THERAPEUTIC THORACENTESIS MEDICATIONS: 6 mL of 1% lidocaine  COMPLICATIONS: None immediate. PROCEDURE: An ultrasound guided thoracentesis was thoroughly discussed with the patient and questions answered. The benefits, risks, alternatives and complications were also discussed. The patient understands and wishes to proceed with the procedure. Written consent was obtained. Ultrasound was performed to localize and mark an adequate pocket of fluid in the right chest. The area was then prepped and draped in the normal sterile fashion. 1% Lidocaine  was used for local anesthesia. Under ultrasound guidance a 6 Fr Safe-T-Centesis catheter was introduced. Thoracentesis was performed. The catheter was removed and a dressing applied. FINDINGS: A total of approximately 700 cc of clear, dark amber pleural fluid was removed. IMPRESSION: Successful ultrasound guided RIGHT thoracentesis yielding 700 mL of pleural fluid. Procedure performed by Lambert Pillion, PA-C Electronically Signed   By: Art Largo M.D.   On: 11/22/2023 16:06   DG Chest 1 View Result Date: 11/22/2023 CLINICAL DATA:  Hypoxia EXAM: CHEST  1 VIEW COMPARISON:  November 17, 2023 FINDINGS: Worsening moderate right pleural effusion with right basilar atelectasis. Subsegmental atelectasis in the left lung base. No pneumothorax. Ventriculoperitoneal shunt catheter tubing along the left hemithorax. No cardiomegaly. Bilateral AC joint osteoarthritis. IMPRESSION: Worsening moderate right pleural effusion with right basilar  atelectasis. Electronically Signed   By: Rance Burrows M.D.   On: 11/22/2023 12:34   VAS US  LOWER EXTREMITY VENOUS (DVT) Result Date: 11/17/2023  Lower Venous DVT Study Patient Name:  SYIER BROUSE  Date of Exam:   11/17/2023 Medical Rec #: [id number]         Accession #:    [id number] Date of Birth: September 12, 1960        Patient Gender: M Patient Age:   74 years Exam Location:  [facility] Procedure:      VAS US  LOWER EXTREMITY VENOUS (DVT) Referring Phys: [facility]  GOEL --------------------------------------------------------------------------------  Indications: Swelling, Edema, and SOB. Other Indications: H/O DVT and SVT, throat cancer. Comparison Study: Previous study on 1.4.2022. Performing Technologist: Ria Chad  Examination Guidelines: A complete evaluation includes B-mode imaging, spectral Doppler, color Doppler, and power Doppler as needed of all accessible portions of each vessel. Bilateral testing is considered an integral part of a complete examination. Limited examinations for reoccurring indications may be performed as noted. The reflux portion of the exam is performed with the patient in reverse Trendelenburg.  +---------+---------------+---------+-----------+----------+--------------+ RIGHT    CompressibilityPhasicitySpontaneityPropertiesThrombus Aging +---------+---------------+---------+-----------+----------+--------------+ CFV      Full           Yes      Yes                                 +---------+---------------+---------+-----------+----------+--------------+ SFJ      Full           Yes      Yes                                 +---------+---------------+---------+-----------+----------+--------------+ FV Prox  Full                                                        +---------+---------------+---------+-----------+----------+--------------+ FV Mid   Full                                                         +---------+---------------+---------+-----------+----------+--------------+ FV DistalFull                                                        +---------+---------------+---------+-----------+----------+--------------+ PFV      Full                                                        +---------+---------------+---------+-----------+----------+--------------+ POP      Full           Yes      Yes                                 +---------+---------------+---------+-----------+----------+--------------+ PTV      Full                                                        +---------+---------------+---------+-----------+----------+--------------+ PERO     Full                                                        +---------+---------------+---------+-----------+----------+--------------+   +---------+---------------+---------+-----------+----------+--------------+  LEFT     CompressibilityPhasicitySpontaneityPropertiesThrombus Aging +---------+---------------+---------+-----------+----------+--------------+ CFV      Full           Yes      Yes                                 +---------+---------------+---------+-----------+----------+--------------+ SFJ      Full           Yes      Yes                                 +---------+---------------+---------+-----------+----------+--------------+ FV Prox  Full                                                        +---------+---------------+---------+-----------+----------+--------------+ FV Mid   Full                                                        +---------+---------------+---------+-----------+----------+--------------+ FV DistalFull                                                        +---------+---------------+---------+-----------+----------+--------------+ PFV      Full                                                         +---------+---------------+---------+-----------+----------+--------------+ POP      Full           Yes      Yes                                 +---------+---------------+---------+-----------+----------+--------------+ PTV      Full                                                        +---------+---------------+---------+-----------+----------+--------------+ PERO     Full                                                        +---------+---------------+---------+-----------+----------+--------------+ GSV      Partial        No       No                                  +---------+---------------+---------+-----------+----------+--------------+  A small segment of the GSV at the middle calf level is thrombosed and chronic.    Summary: RIGHT: - There is no evidence of deep vein thrombosis in the lower extremity.  - No cystic structure found in the popliteal fossa.   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  - There is no evidence of deep vein thrombosis in the lower extremity.  - No cystic structure found in the popliteal fossa.   *See table(s) above for measurements and observations. Electronically signed by Irvin Mantel on 11/17/2023 at 8:04:36 PM.    Final    DG Chest 1 View Result Date: 11/17/2023 CLINICAL DATA:  [id number] S/P thoracentesis [id number] EXAM: PORTABLE CHEST 1 VIEW COMPARISON:  Chest XR, 11/15/2023.  CT chest, 08/13/2023. FINDINGS: Support lines: Spinal cord stimulator, with leads extending outside field of view. Cardiomediastinal silhouette is within normal limits. The LEFT lung is well inflated. Improved aeration of the RIGHT lung post thoracentesis with small volume residual pleural effusion. RIGHT perihilar pulmonary opacity with trace residual fluid layering along the minor fissure. No pneumothorax. RIGHT AC joint degenerative change. No acute osseous abnormality. IMPRESSION: Improved aeration of  the RIGHT lung post thoracentesis with small volume residual pleural effusion. No pneumothorax. Electronically Signed   By: Art Largo M.D.   On: 11/17/2023 16:54   US  THORACENTESIS ASP PLEURAL SPACE W/IMG GUIDE Result Date: 11/17/2023 INDICATION: Patient with history of non-small cell lung cancer of right lung, recurrent pleural effusion. IR consulted for diagnostic and therapeutic right thoracentesis. EXAM: ULTRASOUND GUIDED DIAGNOSTIC AND THERAPEUTIC RIGHT THORACENTESIS MEDICATIONS: 10 mL 1% lidocaine  COMPLICATIONS: None immediate. PROCEDURE: An ultrasound guided thoracentesis was thoroughly discussed with the patient and questions answered. The benefits, risks, alternatives and complications were also discussed. The patient understands and wishes to proceed with the procedure. Written consent was obtained. Ultrasound was performed to localize and mark an adequate pocket of fluid in the right chest. The area was then prepped and draped in the normal sterile fashion. 1% Lidocaine  was used for local anesthesia. Under ultrasound guidance a 6 Fr Safe-T-Centesis catheter was introduced. Thoracentesis was performed. The catheter was removed and a dressing applied. FINDINGS: A total of approximately 820 mL of yellow fluid was removed. Samples were sent to the laboratory as requested by the clinical team. IMPRESSION: Successful ultrasound guided diagnostic and therapeutic RIGHT thoracentesis yielding 820 mL of pleural fluid. Performed by: Wyatt Pommier, PA-C Electronically Signed   By: Art Largo M.D.   On: 11/17/2023 16:41   DG Chest 1 View Result Date: 11/16/2023 CLINICAL DATA:  confusion, cancer history EXAM: CHEST  1 VIEW COMPARISON:  08/27/2023 FINDINGS: Interval development of moderate right pleural effusion, with atelectasis/consolidation the right lung base. There is some linear right suprahilar scarring or atelectasis. Left lung clear. Heart size and mediastinal contours are within normal limits. Small  caliber catheter placement through the left neck, chest, and upper abdomen. Visualized bones unremarkable. IMPRESSION: Moderate right pleural effusion with right basilar atelectasis/consolidation. Electronically Signed   By: Nicoletta Barrier M.D.   On: 11/16/2023 17:56   CT HEAD WO CONTRAST Result Date: 11/16/2023 CLINICAL DATA:  Mental status change of unknown cause. Head and neck cancer being treated with radiation. EXAM: CT HEAD WITHOUT CONTRAST TECHNIQUE: Contiguous axial images were obtained from the base of the skull through the vertex without intravenous contrast. RADIATION DOSE REDUCTION: This exam was performed according to the departmental dose-optimization program which includes automated exposure control,  adjustment of the mA and/or kV according to patient size and/or use of iterative reconstruction technique. COMPARISON:  10/15/2022 FINDINGS: Brain: The brain shows a normal appearance without evidence of malformation, atrophy, old or acute small or large vessel infarction, mass lesion, hemorrhage, hydrocephalus or extra-axial collection. Vascular: No hyperdense vessel. No evidence of atherosclerotic calcification. Skull: Normal.  No traumatic finding.  No focal bone lesion. Sinuses/Orbits: Sinuses are clear. Orbits appear normal. Mastoids are clear. Other: Upper cervical neurostimulator in place. IMPRESSION: Normal head CT. Upper cervical neurostimulator in place. Electronically Signed   By: Bettylou Brunner M.D.   On: 11/16/2023 16:21     ASSESSMENT/PLAN:  This is a very pleasant 63 year old Caucasian male with recurrent lung cancer initially diagnosed with stage IIIb non-small cell lung cancer, adenocarcinoma.  He presented with a right upper lobe lung mass in addition to mediastinal and bilateral supraclavicular lymphadenopathy.  He was diagnosed in January 2020.  His PDL 1 expression is 10% and he has no actionable mutations.    He underwent a course of concurrent chemoradiation with weekly  carboplatin  and paclitaxel . He is status post 6 cycles.  He tolerated well except for fatigue and odynophagia.    He completed his 26 cycles of consolidation immunotherapy with Imfinzi  10 mg/kg IV every 2 weeks. He tolerated it well without any adverse side effects except has a very mild skin rash over his right cervical area. He completed radiation to the enlarging right cervical lymph nodes. His last radiation treatment was on 04/13/2019.   He received 1 cycle of systemic chemotherapy with carboplatin , Alimta  and Keytruda . Repeat tissue biopsy from the left supraclavicular lymph nodes and molecular studies showed that the patient has positive ALK gene translocation. His previous molecular studies by Guardant 360 was negative. Dr. Marguerita Shih discontinue his systemic chemotherapy for now because of the new findings on the molecular studies.    The patient has been on treatment with [name]  600 mg p.o. twice daily.  He is status post 43 months of treatment.    He unfortunately was recently found to have evidence of disease progression and his treatment was switched to 100 mg of [name]  p.o. daily.  He started this on 10/20/2023.    He completed palliative radiation to the cervical lymph nodes on 11/17/23.   The patient had significant intolerance to [name]  and he was recently hospitalized with hallucinations. He was discharged on 11/22/23.    At his last appointment, we discussed options with brigatinib (however Dr. Marguerita Shih discussed concerns with respiratory side effects), reduced dose lorlatinib , and chemotherapy.     Patient saw [facility] for second opinion who recommends chemotherapy.   Dr. Marguerita Shih had a lengthly discussion with the patient today about her current condition and treatment options. The patient was given the option of a referral to hospice/palliative vs. Treatment with systemic chemotherapy with carboplatin  for an AUC of 5 and Alimta  500 mg/m, and ***[name] IV every 3 weeks.  The  patient is interested in proceeding with systemic chemotherapy.  She is expected to start her first dose of this treatment on __.  We discussed the adverse side effects of treatment including but not limited to alopecia, myelosuppression, nausea and vomiting, peripheral neuropathy, liver or renal dysfunction as well as immunotherapy mediated adverse effects.    We will arrange for the patient to have a B12 injection while in the clinic today.     I sent prescriptions for 1 mg folic acid  p.o. daily as well as Compazine  10 mg  every 6 hours as needed for nausea.   The patient will follow-up in 2 weeks for a one-week follow-up visit after completing his first cycle of chemotherapy.  Patient saw pulmonary medicine and he is not interested in a Pleurx at this time.  He undergoes thoracentesis on as needed basis the most recent being on 12/10/2023  He will continue taking Remeron  for his decreased appetite.  We discussed salt water  rinses, Biotene, baking soda rinses.   He is still on 3 L of supplemental oxygen .   He was instructed to use his Zofran  and Compazine  if needed for nausea and vomiting.  The patient was advised to call immediately if he has any concerning symptoms in the interval. The patient voices understanding of current disease status and treatment options and is in agreement with the current care plan. All questions were answered. The patient knows to call the clinic with any problems, questions or concerns. We can certainly see the patient much sooner if necessary       No orders of the defined types were placed in this encounter.    I spent {CHL ONC TIME VISIT - GEXBM:8413244010} counseling the patient face to face. The total time spent in the appointment was {CHL ONC TIME VISIT - UVOZD:[id number]}.  Osmar Howton L Tykiera Raven, PA-C 12/10/23

## 2023-12-10 NOTE — Op Note (Signed)
 Thoracentesis  Procedure Note  RIAD SLEMMER  [id number]  [birth year]  Date:12/10/23  Time:2:54 PM   Provider Performing:Nakia Remmers B Seon Gaertner   Procedure: Thoracentesis with imaging guidance (40981)  Indication(s) Pleural Effusion  Consent Risks of the procedure as well as the alternatives and risks of each were explained to the patient and/or caregiver.  Consent for the procedure was obtained and is signed in the bedside chart  Anesthesia Topical only with 1% lidocaine     Time Out Verified patient identification, verified procedure, site/side was marked, verified correct patient position, special equipment/implants available, medications/allergies/relevant history reviewed, required imaging and test results available.   Sterile Technique Maximal sterile technique including full sterile barrier drape, hand hygiene, sterile gown, sterile gloves, mask, hair covering, sterile ultrasound probe cover (if used).  Procedure Description Ultrasound was used to identify appropriate pleural anatomy for placement and overlying skin marked.  Area of drainage cleaned and draped in sterile fashion. Lidocaine  was used to anesthetize the skin and subcutaneous tissue.  1000 cc's of straw appearing fluid was drained from the right pleural space. Catheter then removed and bandaid applied to site.   Complications/Tolerance None; patient tolerated the procedure well. Chest X-ray is ordered to confirm no post-procedural complication.   EBL Minimal   Specimen(s) None

## 2023-12-12 ENCOUNTER — Encounter (HOSPITAL_COMMUNITY): Payer: Self-pay | Admitting: Pulmonary Disease

## 2023-12-13 ENCOUNTER — Encounter: Payer: Self-pay | Admitting: Neurosurgery

## 2023-12-13 ENCOUNTER — Ambulatory Visit (INDEPENDENT_AMBULATORY_CARE_PROVIDER_SITE_OTHER): Admitting: Neurosurgery

## 2023-12-13 VITALS — BP 128/82 | Temp 98.3°F | Ht 68.0 in | Wt 138.0 lb

## 2023-12-13 DIAGNOSIS — Z9689 Presence of other specified functional implants: Secondary | ICD-10-CM | POA: Insufficient documentation

## 2023-12-13 DIAGNOSIS — G894 Chronic pain syndrome: Secondary | ICD-10-CM

## 2023-12-13 NOTE — Progress Notes (Signed)
   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 Overall, he is doing well.  His pain is under control.  He is taking Tylenol  and Flexeril  as needed.  No new weakness, numbness, or tingling.  PHYSICAL EXAMINATION:  NEUROLOGICAL:  General: In no acute distress.   Awake, alert, oriented to person, place, and time.    Incisions are clean, dry, intact.  No erythema or drainage.  Exam is to baseline.  Incision c/d/I  Imaging:  No new imaging  Assessment / Plan: TORRION GOULDER is doing well after cervical spinal cord stimulator placement 6 weeks ago. He's felt a significant improvement in his QOL so we are glad. Getting better sleep and overall resting better. No incisional issues. We've reached out to the stim team for titration.   Advised to contact the office if any questions or concerns arise.   Carroll Clamp, MD Dept of Neurosurgery

## 2023-12-15 ENCOUNTER — Inpatient Hospital Stay (HOSPITAL_BASED_OUTPATIENT_CLINIC_OR_DEPARTMENT_OTHER): Admitting: Physician Assistant

## 2023-12-15 ENCOUNTER — Inpatient Hospital Stay

## 2023-12-15 ENCOUNTER — Encounter: Payer: Self-pay | Admitting: Internal Medicine

## 2023-12-15 VITALS — BP 119/82 | HR 110 | Temp 97.9°F | Resp 16 | Wt 133.6 lb

## 2023-12-15 DIAGNOSIS — Z923 Personal history of irradiation: Secondary | ICD-10-CM | POA: Diagnosis not present

## 2023-12-15 DIAGNOSIS — Z5111 Encounter for antineoplastic chemotherapy: Secondary | ICD-10-CM | POA: Diagnosis not present

## 2023-12-15 DIAGNOSIS — C3491 Malignant neoplasm of unspecified part of right bronchus or lung: Secondary | ICD-10-CM

## 2023-12-15 DIAGNOSIS — Z5112 Encounter for antineoplastic immunotherapy: Secondary | ICD-10-CM | POA: Diagnosis not present

## 2023-12-15 DIAGNOSIS — C770 Secondary and unspecified malignant neoplasm of lymph nodes of head, face and neck: Secondary | ICD-10-CM | POA: Diagnosis not present

## 2023-12-15 DIAGNOSIS — C7931 Secondary malignant neoplasm of brain: Secondary | ICD-10-CM | POA: Diagnosis not present

## 2023-12-15 DIAGNOSIS — C3411 Malignant neoplasm of upper lobe, right bronchus or lung: Secondary | ICD-10-CM | POA: Diagnosis not present

## 2023-12-15 DIAGNOSIS — Z79899 Other long term (current) drug therapy: Secondary | ICD-10-CM | POA: Diagnosis not present

## 2023-12-15 LAB — CMP (CANCER CENTER ONLY)
ALT: 11 U/L (ref 0–44)
AST: 13 U/L — ABNORMAL LOW (ref 15–41)
Albumin: 3.3 g/dL — ABNORMAL LOW (ref 3.5–5.0)
Alkaline Phosphatase: 101 U/L (ref 38–126)
Anion gap: 6 (ref 5–15)
BUN: 11 mg/dL (ref 6–20)
CO2: 36 mmol/L — ABNORMAL HIGH (ref 22–32)
Calcium: 9.1 mg/dL (ref 8.9–10.3)
Chloride: 98 mmol/L (ref 98–111)
Creatinine: 0.54 mg/dL — ABNORMAL LOW (ref 0.61–1.24)
GFR, Estimated: >60 mL/min (ref >60)
Glucose, Bld: 127 mg/dL — ABNORMAL HIGH (ref 70–99)
Potassium: 3.6 mmol/L (ref 3.5–5.1)
Sodium: 140 mmol/L (ref 135–145)
Total Bilirubin: 0.4 mg/dL (ref 0.0–1.2)
Total Protein: 6.3 g/dL — ABNORMAL LOW (ref 6.5–8.1)

## 2023-12-15 LAB — CBC WITH DIFFERENTIAL (CANCER CENTER ONLY)
Abs Immature Granulocytes: 0.04 10*3/uL (ref 0.00–0.07)
Basophils Absolute: 0.1 10*3/uL (ref 0.0–0.1)
Basophils Relative: 1 %
Eosinophils Absolute: 0.4 10*3/uL (ref 0.0–0.5)
Eosinophils Relative: 4 %
HCT: 31.7 % — ABNORMAL LOW (ref 39.0–52.0)
Hemoglobin: 10.2 g/dL — ABNORMAL LOW (ref 13.0–17.0)
Immature Granulocytes: 0 %
Lymphocytes Relative: 5 %
Lymphs Abs: 0.6 10*3/uL — ABNORMAL LOW (ref 0.7–4.0)
MCH: 29.8 pg (ref 26.0–34.0)
MCHC: 32.2 g/dL (ref 30.0–36.0)
MCV: 92.7 fL (ref 80.0–100.0)
Monocytes Absolute: 1.3 10*3/uL — ABNORMAL HIGH (ref 0.1–1.0)
Monocytes Relative: 13 %
Neutro Abs: 7.9 10*3/uL — ABNORMAL HIGH (ref 1.7–7.7)
Neutrophils Relative %: 77 %
Platelet Count: 254 10*3/uL (ref 150–400)
RBC: 3.42 MIL/uL — ABNORMAL LOW (ref 4.22–5.81)
RDW: 15.9 % — ABNORMAL HIGH (ref 11.5–15.5)
WBC Count: 10.2 10*3/uL (ref 4.0–10.5)
nRBC: 0 % (ref 0.0–0.2)

## 2023-12-15 MED ORDER — PROCHLORPERAZINE MALEATE 10 MG PO TABS: 10.0000 mg | ORAL_TABLET | Freq: Four times a day (QID) | ORAL | 2 refills | Status: DC | PRN

## 2023-12-15 MED ORDER — FOLIC ACID 1 MG PO TABS: 1.0000 mg | ORAL_TABLET | Freq: Every day | ORAL | 2 refills | Status: DC

## 2023-12-15 MED ORDER — DEXAMETHASONE 4 MG PO TABS: ORAL_TABLET | ORAL | 2 refills | Status: DC

## 2023-12-15 MED ORDER — CYANOCOBALAMIN 1000 MCG/ML IJ SOLN
1000.0000 ug | Freq: Once | INTRAMUSCULAR | Status: AC
  Administered 2023-12-15: 1000 ug via INTRAMUSCULAR
  Filled 2023-12-15: qty 1

## 2023-12-15 NOTE — Progress Notes (Signed)
 DISCONTINUE ON PATHWAY REGIMEN - Non-Small Cell Lung     PO twice daily:     [name]   **Always confirm dose/schedule in your pharmacy ordering system**  PRIOR TREATMENT: [id number]: [name] 600 mg Twice Daily Until Progression or Unacceptable Toxicity  START ON PATHWAY REGIMEN - Non-Small Cell Lung     Cycles 1 through up to 6: A cycle is every 21 days:     Bevacizumab-xxxx      Pemetrexed       Carboplatin    **Always confirm dose/schedule in your pharmacy ordering system**  Patient Characteristics: Stage IV Metastatic, Nonsquamous, Molecular Analysis Completed, Molecular Alteration Present and Targeted Therapy Exhausted OR KRAS G12C+ or HER2+ or NRG1+ Present and No Prior Chemo/Immunotherapy OR No Alteration Present, Initial  Chemotherapy/Immunotherapy, PS = 0, 1, ALK/ROS1/NTRK/RET/NRG1 Fusion/Rearrangement Positive Therapeutic Status: Stage IV Metastatic Histology: Nonsquamous Cell Broad Molecular Profiling Status: Molecular Analysis Completed Molecular Analysis Results: Alteration Present and Targeted Therapy Exhausted ECOG Performance Status: 1 Chemotherapy/Immunotherapy Line of Therapy: Initial Chemotherapy/Immunotherapy Intent of Therapy: Non-Curative / Palliative Intent, Discussed with Patient

## 2023-12-15 NOTE — Patient Instructions (Signed)
-  There are two main categories of lung cancer, they are named based on the size of the cancer cell. One is called Non-Small cell lung cancer. The other type is Small Cell Lung Cancer -The sample (biopsy) that they took of your tumor was consistent with a subtype of Non-small cell lung cancer called Adenocarcinoma. This is the most common type of lung cancer.  -We covered a lot of important information at your appointment today regarding what the treatment plan is moving forward. Here are the the main points that were discussed at your office visit with us  today:  -The treatment that you will receive consists of three chemotherapy drugs, called Carboplatin  and Alimta  (also called Pemetrexed ) and [name] (Bevacizumab)  -We are planning on starting your treatment next week on _/_/_ but before your start your treatment, I would like you to attend a Chemotherapy Education Class. This involves having you sit down with one of our nurse educators. She will discuss with your one-on-one more details about your treatment as well as general information about resources here at the [facility].  -Your treatment will be given once every 3 weeks. We will check your labs once a week for the first ~5 treatments just to make sure that important components of your blood are in an acceptable range -We will get a CT scan after 3 treatments to check on the progress of treatment  Medications:  -I have sent a few important medication prescriptions to your pharmacy.  -Compazine  was sent to your pharmacy. This medication is for nausea. You may take this every 6 hours as needed if you feel nausous.  -I have also sent a prescription for 1 mg of folic acid  to your pharmacy. We need you to take 1 tablet every day. -You will receive a Vitamin B12 injection in the clinic every 9 weeks  -I have sent a prescription for Decadron  (dexamethasone ) to your pharmacy. This medication is a steroid. The purpose of thie medication is because it  is a pre-medication for your treatment. You will need to take 1 (4 mg) tablet TWICE a day the day before, the day of, and the day after chemotherapy. This means that you will will take 6 tablets total during those 3 consecutive days.   Side Effects:  -The adverse effect of this treatment including but not limited to alopecia (losing your hair), myelosuppression (drops in the blood counts), nausea and vomiting, peripheral neuropathy (numbness and tingling in the hands and feet), liver or renal dysfunction. This treatment may also cause bleeding or GI perforation.   Follow up:  -We will see you back for a follow up visit in __ weeks

## 2023-12-16 ENCOUNTER — Telehealth: Payer: Self-pay | Admitting: Internal Medicine

## 2023-12-16 NOTE — Telephone Encounter (Signed)
 Left the patient a voicemail with the upcoming appointment detials. The patient is active on MyChart.

## 2023-12-17 ENCOUNTER — Other Ambulatory Visit: Payer: Self-pay

## 2023-12-17 NOTE — Progress Notes (Signed)
  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     The patient has scheduled follow up with his medical oncologist Dr. Marguerita Shih for ongoing surveillance, and with Dr. Lurena Sally. The patient was encouraged to call if he  develops concerns or questions regarding radiation.    Avery Bodo, LPN

## 2023-12-20 ENCOUNTER — Ambulatory Visit
Admit: 2023-12-20 | Discharge: 2023-12-20 | Disposition: A | Discharge status: Home / Self Care | Attending: Physician Assistant | Admitting: Physician Assistant

## 2023-12-22 ENCOUNTER — Inpatient Hospital Stay

## 2023-12-22 ENCOUNTER — Encounter: Payer: Self-pay | Admitting: Internal Medicine

## 2023-12-22 VITALS — BP 106/66 | HR 96 | Temp 97.8°F | Resp 17 | Ht 68.0 in | Wt 133.8 lb

## 2023-12-22 DIAGNOSIS — C3491 Malignant neoplasm of unspecified part of right bronchus or lung: Secondary | ICD-10-CM

## 2023-12-22 DIAGNOSIS — Z79899 Other long term (current) drug therapy: Secondary | ICD-10-CM | POA: Diagnosis not present

## 2023-12-22 DIAGNOSIS — C3411 Malignant neoplasm of upper lobe, right bronchus or lung: Secondary | ICD-10-CM | POA: Diagnosis not present

## 2023-12-22 DIAGNOSIS — Z5111 Encounter for antineoplastic chemotherapy: Secondary | ICD-10-CM | POA: Diagnosis not present

## 2023-12-22 DIAGNOSIS — C770 Secondary and unspecified malignant neoplasm of lymph nodes of head, face and neck: Secondary | ICD-10-CM | POA: Diagnosis not present

## 2023-12-22 DIAGNOSIS — Z5112 Encounter for antineoplastic immunotherapy: Secondary | ICD-10-CM | POA: Diagnosis not present

## 2023-12-22 DIAGNOSIS — C7931 Secondary malignant neoplasm of brain: Secondary | ICD-10-CM | POA: Diagnosis not present

## 2023-12-22 DIAGNOSIS — Z923 Personal history of irradiation: Secondary | ICD-10-CM | POA: Diagnosis not present

## 2023-12-22 LAB — CBC WITH DIFFERENTIAL (CANCER CENTER ONLY)
Abs Immature Granulocytes: 0.08 10*3/uL — ABNORMAL HIGH (ref 0.00–0.07)
Basophils Absolute: 0.1 10*3/uL (ref 0.0–0.1)
Basophils Relative: 0 %
Eosinophils Absolute: 0 10*3/uL (ref 0.0–0.5)
Eosinophils Relative: 0 %
HCT: 34.6 % — ABNORMAL LOW (ref 39.0–52.0)
Hemoglobin: 11.2 g/dL — ABNORMAL LOW (ref 13.0–17.0)
Immature Granulocytes: 0 %
Lymphocytes Relative: 3 %
Lymphs Abs: 0.5 10*3/uL — ABNORMAL LOW (ref 0.7–4.0)
MCH: 29.5 pg (ref 26.0–34.0)
MCHC: 32.4 g/dL (ref 30.0–36.0)
MCV: 91.1 fL (ref 80.0–100.0)
Monocytes Absolute: 2 10*3/uL — ABNORMAL HIGH (ref 0.1–1.0)
Monocytes Relative: 10 %
Neutro Abs: 17.7 10*3/uL — ABNORMAL HIGH (ref 1.7–7.7)
Neutrophils Relative %: 87 %
Platelet Count: 323 10*3/uL (ref 150–400)
RBC: 3.8 MIL/uL — ABNORMAL LOW (ref 4.22–5.81)
RDW: 15.2 % (ref 11.5–15.5)
WBC Count: 20.4 10*3/uL — ABNORMAL HIGH (ref 4.0–10.5)
nRBC: 0 % (ref 0.0–0.2)

## 2023-12-22 LAB — CMP (CANCER CENTER ONLY)
ALT: 8 U/L (ref 0–44)
AST: 10 U/L — ABNORMAL LOW (ref 15–41)
Albumin: 3.5 g/dL (ref 3.5–5.0)
Alkaline Phosphatase: 106 U/L (ref 38–126)
Anion gap: 7 (ref 5–15)
BUN: 14 mg/dL (ref 6–20)
CO2: 32 mmol/L (ref 22–32)
Calcium: 9.4 mg/dL (ref 8.9–10.3)
Chloride: 101 mmol/L (ref 98–111)
Creatinine: 0.44 mg/dL — ABNORMAL LOW (ref 0.61–1.24)
GFR, Estimated: >60 mL/min (ref >60)
Glucose, Bld: 138 mg/dL — ABNORMAL HIGH (ref 70–99)
Potassium: 3.9 mmol/L (ref 3.5–5.1)
Sodium: 140 mmol/L (ref 135–145)
Total Bilirubin: 0.3 mg/dL (ref 0.0–1.2)
Total Protein: 6.6 g/dL (ref 6.5–8.1)

## 2023-12-22 MED ORDER — DEXAMETHASONE SODIUM PHOSPHATE 10 MG/ML IJ SOLN
10.0000 mg | Freq: Once | INTRAMUSCULAR | Status: AC
  Administered 2023-12-22: 10 mg via INTRAVENOUS
  Filled 2023-12-22: qty 1

## 2023-12-22 MED ORDER — SODIUM CHLORIDE 0.9 % IV SOLN
INTRAVENOUS | Status: DC
Start: 2023-12-22 — End: 2023-12-22

## 2023-12-22 MED ORDER — PALONOSETRON HCL INJECTION 0.25 MG/5ML
0.2500 mg | Freq: Once | INTRAVENOUS | Status: AC
  Administered 2023-12-22: 0.25 mg via INTRAVENOUS
  Filled 2023-12-22: qty 5

## 2023-12-22 MED ORDER — SODIUM CHLORIDE 0.9 % IV SOLN
436.8000 mg | Freq: Once | INTRAVENOUS | Status: AC
  Administered 2023-12-22: 440 mg via INTRAVENOUS
  Filled 2023-12-22: qty 44

## 2023-12-22 MED ORDER — SODIUM CHLORIDE 0.9 % IV SOLN
150.0000 mg | Freq: Once | INTRAVENOUS | Status: AC
  Administered 2023-12-22: 150 mg via INTRAVENOUS
  Filled 2023-12-22: qty 150

## 2023-12-22 MED ORDER — SODIUM CHLORIDE 0.9 % IV SOLN
400.0000 mg/m2 | Freq: Once | INTRAVENOUS | Status: AC
  Administered 2023-12-22: 700 mg via INTRAVENOUS
  Filled 2023-12-22: qty 20

## 2023-12-22 MED ORDER — SODIUM CHLORIDE 0.9 % IV SOLN
15.0000 mg/kg | Freq: Once | INTRAVENOUS | Status: AC
  Administered 2023-12-22: 900 mg via INTRAVENOUS
  Filled 2023-12-22: qty 4

## 2023-12-22 NOTE — Patient Instructions (Signed)
 [facility] - A DEPT OF [facility]  Discharge Instructions: Thank you for choosing [facility] to provide your oncology and hematology care.   If you have a lab appointment with the Cancer Center, please go directly to the Cancer Center and check in at the registration area.   Wear comfortable clothing and clothing appropriate for easy access to any Portacath or PICC line.   We strive to give you quality time with your provider. You may need to reschedule your appointment if you arrive late (15 or more minutes).  Arriving late affects you and other patients whose appointments are after yours.  Also, if you miss three or more appointments without notifying the office, you may be dismissed from the clinic at the provider's discretion.      For prescription refill requests, have your pharmacy contact our office and allow 72 hours for refills to be completed.    Today you received the following chemotherapy and/or immunotherapy agents: Bevacizumab, Pemetrexed , Carboplatin    To help prevent nausea and vomiting after your treatment, we encourage you to take your nausea medication as directed.  BELOW ARE SYMPTOMS THAT SHOULD BE REPORTED IMMEDIATELY: *FEVER GREATER THAN 100.4 F (38 C) OR HIGHER *CHILLS OR SWEATING *NAUSEA AND VOMITING THAT IS NOT CONTROLLED WITH YOUR NAUSEA MEDICATION *UNUSUAL SHORTNESS OF BREATH *UNUSUAL BRUISING OR BLEEDING *URINARY PROBLEMS (pain or burning when urinating, or frequent urination) *BOWEL PROBLEMS (unusual diarrhea, constipation, pain near the anus) TENDERNESS IN MOUTH AND THROAT WITH OR WITHOUT PRESENCE OF ULCERS (sore throat, sores in mouth, or a toothache) UNUSUAL RASH, SWELLING OR PAIN  UNUSUAL VAGINAL DISCHARGE OR ITCHING   Items with * indicate a potential emergency and should be followed up as soon as possible or go to the Emergency Department if any problems should occur.  Please show the CHEMOTHERAPY  ALERT CARD or IMMUNOTHERAPY ALERT CARD at check-in to the Emergency Department and triage nurse.  Should you have questions after your visit or need to cancel or reschedule your appointment, please contact [facility] - A DEPT OF [facility]  Dept: [phone]  and follow the prompts.  Office hours are 8:00 a.m. to 4:30 p.m. Monday - Friday. Please note that voicemails left after 4:00 p.m. may not be returned until the following business day.  We are closed weekends and major holidays. You have access to a nurse at all times for urgent questions. Please call the main number to the clinic Dept: [phone] and follow the prompts.   For any non-urgent questions, you may also contact your provider using MyChart. We now offer e-Visits for anyone 33 and older to request care online for non-urgent symptoms. For details visit [email or web address].   Also download the MyChart app! Go to the app store, search "MyChart", open the app, select , and log in with your MyChart username and password.

## 2023-12-22 NOTE — Progress Notes (Signed)
 OK to treat despite HR of 107 per Dr Marguerita Shih & without urine protein today.

## 2023-12-23 ENCOUNTER — Other Ambulatory Visit: Payer: Self-pay

## 2023-12-23 DIAGNOSIS — J189 Pneumonia, unspecified organism: Secondary | ICD-10-CM | POA: Diagnosis not present

## 2023-12-28 ENCOUNTER — Inpatient Hospital Stay

## 2023-12-28 ENCOUNTER — Inpatient Hospital Stay (HOSPITAL_BASED_OUTPATIENT_CLINIC_OR_DEPARTMENT_OTHER): Admitting: Internal Medicine

## 2023-12-28 VITALS — BP 110/79 | HR 118 | Temp 98.4°F | Resp 17 | Ht 68.0 in | Wt 127.7 lb

## 2023-12-28 DIAGNOSIS — Z923 Personal history of irradiation: Secondary | ICD-10-CM | POA: Diagnosis not present

## 2023-12-28 DIAGNOSIS — Z79899 Other long term (current) drug therapy: Secondary | ICD-10-CM | POA: Diagnosis not present

## 2023-12-28 DIAGNOSIS — C7931 Secondary malignant neoplasm of brain: Secondary | ICD-10-CM | POA: Diagnosis not present

## 2023-12-28 DIAGNOSIS — Z5112 Encounter for antineoplastic immunotherapy: Secondary | ICD-10-CM | POA: Diagnosis not present

## 2023-12-28 DIAGNOSIS — C3491 Malignant neoplasm of unspecified part of right bronchus or lung: Secondary | ICD-10-CM

## 2023-12-28 DIAGNOSIS — Z5111 Encounter for antineoplastic chemotherapy: Secondary | ICD-10-CM | POA: Diagnosis not present

## 2023-12-28 DIAGNOSIS — C3411 Malignant neoplasm of upper lobe, right bronchus or lung: Secondary | ICD-10-CM | POA: Diagnosis not present

## 2023-12-28 DIAGNOSIS — C770 Secondary and unspecified malignant neoplasm of lymph nodes of head, face and neck: Secondary | ICD-10-CM | POA: Diagnosis not present

## 2023-12-28 LAB — CMP (CANCER CENTER ONLY)
ALT: 14 U/L (ref 0–44)
AST: 14 U/L — ABNORMAL LOW (ref 15–41)
Albumin: 3.5 g/dL (ref 3.5–5.0)
Alkaline Phosphatase: 102 U/L (ref 38–126)
Anion gap: 5 (ref 5–15)
BUN: 10 mg/dL (ref 6–20)
CO2: 32 mmol/L (ref 22–32)
Calcium: 9 mg/dL (ref 8.9–10.3)
Chloride: 99 mmol/L (ref 98–111)
Creatinine: 0.36 mg/dL — ABNORMAL LOW (ref 0.61–1.24)
GFR, Estimated: >60 mL/min (ref >60)
Glucose, Bld: 101 mg/dL — ABNORMAL HIGH (ref 70–99)
Potassium: 3.6 mmol/L (ref 3.5–5.1)
Sodium: 136 mmol/L (ref 135–145)
Total Bilirubin: 0.3 mg/dL (ref 0.0–1.2)
Total Protein: 6.5 g/dL (ref 6.5–8.1)

## 2023-12-28 LAB — CBC WITH DIFFERENTIAL (CANCER CENTER ONLY)
Abs Immature Granulocytes: 0.05 10*3/uL (ref 0.00–0.07)
Basophils Absolute: 0 10*3/uL (ref 0.0–0.1)
Basophils Relative: 1 %
Eosinophils Absolute: 0.2 10*3/uL (ref 0.0–0.5)
Eosinophils Relative: 3 %
HCT: 36.4 % — ABNORMAL LOW (ref 39.0–52.0)
Hemoglobin: 11.7 g/dL — ABNORMAL LOW (ref 13.0–17.0)
Immature Granulocytes: 1 %
Lymphocytes Relative: 9 %
Lymphs Abs: 0.5 10*3/uL — ABNORMAL LOW (ref 0.7–4.0)
MCH: 28.8 pg (ref 26.0–34.0)
MCHC: 32.1 g/dL (ref 30.0–36.0)
MCV: 89.7 fL (ref 80.0–100.0)
Monocytes Absolute: 0.3 10*3/uL (ref 0.1–1.0)
Monocytes Relative: 6 %
Neutro Abs: 4.9 10*3/uL (ref 1.7–7.7)
Neutrophils Relative %: 80 %
Platelet Count: 168 10*3/uL (ref 150–400)
RBC: 4.06 MIL/uL — ABNORMAL LOW (ref 4.22–5.81)
RDW: 14.8 % (ref 11.5–15.5)
WBC Count: 6 10*3/uL (ref 4.0–10.5)
nRBC: 0 % (ref 0.0–0.2)

## 2023-12-28 MED ORDER — METHYLPREDNISOLONE 4 MG PO TBPK: ORAL_TABLET | ORAL | 0 refills | Status: DC

## 2023-12-28 NOTE — Progress Notes (Signed)
 [facility] [phone]   [phone]  OFFICE PROGRESS NOTE  [name], MD [address] [place] [address]  DIAGNOSIS:  Metastatic non-small cell lung cancer initially diagnosed as stage IIIB (T1c, N3, M0) non-small cell lung cancer, adenocarcinoma presented with right upper lobe lung mass in addition to mediastinal and bilateral supraclavicular lymphadenopathy diagnosed in January 2020.  He had evidence of disease recurrence in June 2021 with adenopathy in the subcarinal, level 3 left neck lymph node, and lymph node at the curious of the diaphragm and in the upper abdomen.   PD-L1: 10%   Guardant 360 molecular studies showed no actionable mutation   Foundation One Testing:   Biomarker Findings Microsatellite status - MS-Stable Tumor Mutational Burden - 4 Muts/Mb Genomic Findings For a complete list of the genes assayed, please refer to the Appendix. ALK EML4-ALK fusion (Variant 2) SMARCB1 R377C CTNNB1 S45P CDKN2A/B CDKN2B loss, CDKN2A loss CXCR4 W106* TP53 splice site 672G>A 7 Disease relevant genes with no reportable alterations: BRAF, EGFR, ERBB2, KRAS, MET, RET, ROS1   PRIOR THERAPY: 1) Concurrent chemoradiation with weekly carboplatin  for AUC of 2 and paclitaxel  45 mg/M2.  Status post 6 cycles.  Last dose was given on October 10, 2018 with stable disease. 2) Radiation treatment to the enlarging right cervical lymph nodes under the care of Dr. Eloise Hake. First treatment 03/06/2019. Last treatment scheduled on 04/13/2019 3) Consolidation treatment with immunotherapy with Imfinzi  10 mg/KG every 2 weeks.  First dose November 17, 2018.  Status post 26 cycles. 4) Systemic chemotherapy with carboplatin  for an AUC of 5, Alimta  500 mg/m2, and Keytruda  200 mg IV every 3 weeks. First dose expected on 02/14/20. Status post 1 cycle.  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 5) SBRT to the left lower lobe lung nodule. 6) [name] 600 mg p.o. twice daily.  He started the first dose on March 08, 2020.   Status post 44 months of treatment.  Discontinued in March 2025 due to disease progression 7) [name] 100 mg daily.  Discontinued in April 2025 due to severe intolerance due to mental status changes/confusion  8) Radiation to the bilateral cervical lymph nodes under the care of Dr. Jeryl Moris last day expected on 11/17/2023    CURRENT THERAPY: Palliative systemic chemotherapy with carboplatin  for an AUC of 4 (dose reduced), Alimta  500 mg/m (dose reduced), and [name] IV every 3 weeks.  First dose expected on 12/23/2023  INTERVAL HISTORY: Brian [name] 63 y.o. male returns to the clinic today for follow-up visit accompanied by his wife.Discussed the use of AI scribe software for clinical note transcription with the patient, who gave verbal consent to proceed.  History of Present Illness   Brian [name] is a 63 year old male with stage four non-small cell lung cancer who presents for guidance and management of treatment side effects. He is accompanied by his wife.  He has a history of stage four non-small cell lung cancer, adenocarcinoma with positive ALK gene translocation. Initially diagnosed as stage three B in January 2020, his condition showed recurrence and metastasis in June 2021. He has undergone several treatments including chemoradiation, consolidation immunotherapy for one year, and targeted therapy with alectinib for approximately 44 months, which was discontinued due to disease progression. He also tried lorlatinib  100 mg PO daily but could not tolerate it, leading to its  discontinuation. Currently, he is on palliative systemic chemotherapy with carboplatin , pemetrexed , and bevacizumab.  He feels weak but does not attribute this to the treatment. He is concerned about ongoing weight loss despite efforts to manage it. He  experiences a poor appetite and feels 'almost nauseated' with most foods. He has been eating smaller portions, such as a third of his usual cereal amount or one piece of toast instead of two or three. Following chemotherapy on Wednesday, he noticed increased activity and appetite on Thursday and Friday, which he attributes to steroid use.  He has difficulty sleeping, having slept only an hour last night. He experiences intermittent breathing difficulties and stiffness in the right side of his chest. Swallowing is more difficult, but he manages by taking his time and drinking more water . No issues with lymph nodes in his neck affecting swallowing.       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  MEDICATIONS:  Current Outpatient Medications  Medication Sig Dispense Refill   acetaminophen  (TYLENOL ) 500 MG tablet Take 1,000 mg by mouth every 6 (six) hours as needed (for pain).     albuterol  (VENTOLIN  HFA) 108 (90 Base) MCG/ACT inhaler INHALE 2 PUFFS INTO THE LUNGS EVERY 4 (FOUR) HOURS AS NEEDED FOR WHEEZING OR SHORTNESS OF BREATH. 6.7 each 4   amLODipine  (NORVASC ) 5 MG tablet Take 0.5 tablets (2.5 mg total) by mouth in the morning. 30 tablet 2   atorvastatin  (LIPITOR) 20 MG tablet Take 20 mg by mouth in the morning.     baclofen   (LIORESAL ) 10 MG tablet Take 10 mg by mouth at bedtime.     cyclobenzaprine  (FLEXERIL ) 5 MG tablet TAKE 1 TABLET BY MOUTH THREE TIMES A DAY AS NEEDED FOR MUSCLE SPASMS 30 tablet 0   dexamethasone  (DECADRON ) 4 MG tablet Take 1 tablet twice a day the day before, the day of, and the day after chemotherapy. 40 tablet 2   DULoxetine  (CYMBALTA ) 60 MG capsule Take 1 capsule (60 mg total) by mouth daily. 90 capsule 3   fluticasone  (FLONASE ) 50 MCG/ACT nasal spray Place 1 spray into both nostrils daily. 1 mL 2   folic acid  (FOLVITE ) 1 MG tablet Take 1 tablet (1 mg total) by mouth daily. 30 tablet 2   levothyroxine  (SYNTHROID ) 100 MCG tablet Take 100 mcg by mouth daily before breakfast.     MIRALAX  17 GM/SCOOP powder Take 17 g by mouth daily as needed for mild constipation.     mirtazapine  (REMERON ) 15 MG tablet TAKE 1 TABLET BY MOUTH EVERYDAY AT BEDTIME 90 tablet 1   prochlorperazine  (COMPAZINE ) 10 MG tablet Take 1 tablet (10 mg total) by mouth every 6 (six) hours as needed. 30 tablet 2   TRELEGY ELLIPTA  100-62.5-25 MCG/INH  AEPB Inhale 1 puff into the lungs in the morning.     venlafaxine  (EFFEXOR ) 37.5 MG tablet Take 37.5 mg by mouth in the morning.     No current facility-administered medications for this visit.    SURGICAL HISTORY:  Past Surgical History:  Procedure Laterality Date   COLONOSCOPY     FEMUR FRACTURE SURGERY Left 1969   INGUINAL HERNIA REPAIR Right 06/24/2020   Procedure: RIGHT OPEN INGUINAL HERNIA REPAIR WITH MESH;  Surgeon: Kinsinger, Alphonso Aschoff, MD;  Location: [facility];  Service: General;  Laterality: Right;   IR IVC FILTER PLMT / S&I Dan Dun GUID/MOD SED  03/04/2020   IR IVC FILTER RETRIEVAL / S&I Dan Dun GUID/MOD SED  08/15/2020   IR RADIOLOGIST EVAL & MGMT  08/06/2020   SPINAL CORD STIMULATOR INSERTION N/A 10/05/2023   Procedure: CERVICAL SPINAL CORD STIMULATOR PLACEMENT;  Surgeon: Carroll Clamp, MD;  Location: [facility] ORS;  Service: Neurosurgery;  Laterality: N/A;   THORACENTESIS  Right 12/10/2023   Procedure: THORACENTESIS;  Surgeon: [name], MD;  Location: [facility];  Service: Pulmonary;  Laterality: Right;    REVIEW OF SYSTEMS:  Constitutional: positive for anorexia, fatigue, and weight loss Eyes: negative Ears, nose, mouth, throat, and face: negative Respiratory: positive for dyspnea on exertion Cardiovascular: negative Gastrointestinal: negative Genitourinary:negative Integument/breast: negative Hematologic/lymphatic: negative Musculoskeletal:positive for muscle weakness Neurological: negative Behavioral/Psych: negative Endocrine: negative Allergic/Immunologic: negative   PHYSICAL EXAMINATION: General appearance: alert, cooperative, fatigued, and no distress Head: Normocephalic, without obvious abnormality, atraumatic Neck: no adenopathy, no JVD, supple, symmetrical, trachea midline, and thyroid  not enlarged, symmetric, no tenderness/mass/nodules Lymph nodes: Cervical, supraclavicular, and axillary nodes normal. Resp: clear to auscultation bilaterally Back: symmetric, no curvature. ROM normal. No CVA tenderness. Cardio: regular rate and rhythm, S1, S2 normal, no murmur, click, rub or gallop GI: soft, non-tender; bowel sounds normal; no masses,  no organomegaly Extremities: extremities normal, atraumatic, no cyanosis or edema Neurologic: Alert and oriented X 3, normal strength and tone. Normal symmetric reflexes. Normal coordination and gait  ECOG PERFORMANCE STATUS: 1 - Symptomatic but completely ambulatory  There were no vitals taken for this visit.  LABORATORY DATA: Lab Results  Component Value Date   WBC 6.0 12/28/2023   HGB 11.7 (L) 12/28/2023   HCT 36.4 (L) 12/28/2023   MCV 89.7 12/28/2023   PLT 168 12/28/2023      Chemistry      Component Value Date/Time   NA 140 12/22/2023 0840   K 3.9 12/22/2023 0840   CL 101 12/22/2023 0840   CO2 32 12/22/2023 0840   BUN 14 12/22/2023 0840   CREATININE 0.44 (L) 12/22/2023 0840       Component Value Date/Time   CALCIUM  9.4 12/22/2023 0840   ALKPHOS 106 12/22/2023 0840   AST 10 (L) 12/22/2023 0840   ALT 8 12/22/2023 0840   BILITOT 0.3 12/22/2023 0840       RADIOGRAPHIC STUDIES: DG CHEST PORT 1 VIEW Result Date: 12/10/2023 CLINICAL DATA:  Status post thoracentesis. EXAM: PORTABLE CHEST 1 VIEW COMPARISON:  X-ray 11/22/2023 FINDINGS: Underinflation. Persistent mild bandlike opacity right perihilar. Presumed accessory fissure at the right lung base. No pneumothorax or edema. No new consolidation. Normal cardiopericardial silhouette. Degenerative changes along the spine and shoulders. Overlapping cardiac leads. Stimulator lead along the left hemithorax IMPRESSION: No pneumothorax post thoracentesis. Electronically Signed   By: Adrianna Horde M.D.   On: 12/10/2023 16:07    ASSESSMENT AND PLAN: This is a very pleasant 63 years old white male  with metastatic non-small cell lung cancer, adenocarcinoma with positive ALK gene translocation that was initially diagnosed as a stage IIIb in January 2020 with evidence for disease recurrence and metastasis in June 2021. He was initially treated with a course of concurrent chemoradiation followed by consolidation treatment with immunotherapy and then systemic chemotherapy with 1 cycle of carboplatin , Alimta  and Keytruda  every 4 his tumor was found to have ALK gene translocation. At that time he was switched to treatment with alectinib since August 2021 status post 44 months of treatment discontinued in March 2025 secondary to disease progression. The patient started treatment with [name]  100 mg p.o. daily but he was unable to take it more than few weeks and this discontinued secondary to significant toxicity with mental status change and confusion. Is here today for evaluation and discussion of other treatment options. I recommended for the patient treatment with systemic chemotherapy with carboplatin  for AUC of 4 and Alimta  400 Mg/M2 in addition  to [name] 15 Mg/KG on the first cycle of his treatment.  He tolerated the first week of his treatment fairly well except for the fatigue and weight loss. Assessment and Plan    Stage 4 non-small cell lung cancer, adenocarcinoma with ALK gene translocation Stage 4 non-small cell lung cancer with ALK gene translocation, initially diagnosed as stage 3B in January 2020, with recurrence and metastasis in June 2021. Currently on palliative systemic chemotherapy with carboplatin , pemetrexed , and bevacizumab. Experiencing weight loss and decreased appetite, with some improvement in activity and appetite following steroid administration. Swallowing difficulties present, but no significant lymph node compression. Mild anemia present, consistent with previous findings. Intermittent chest stiffness due to pleural effusion. Lab work shows no significant drop in blood count. - Prescribe Medrol  Dosepak for one week to boost appetite and activity. - Advise taking antiemetic 30 minutes before meals to improve appetite. - Recommend half a Benadryl  before sleep to aid with insomnia. - Schedule follow-up in two weeks for next chemotherapy cycle.  Mild anemia Mild anemia present, consistent with previous findings. No significant drop in blood count observed.   The patient was advised to call immediately if she has any concerning symptoms in the interval. The patient voices understanding of current disease status and treatment options and is in agreement with the current care plan.  All questions were answered. The patient knows to call the clinic with any problems, questions or concerns. We can certainly see the patient much sooner if necessary.  The total time spent in the appointment was 30 minutes including review of chart and various tests results, discussions about plan of care and coordination of care plan .   Disclaimer: This note was dictated with voice recognition software. Similar sounding words can  inadvertently be transcribed and may not be corrected upon review.

## 2024-01-01 LAB — ACID FAST CULTURE WITH REFLEXED SENSITIVITIES (MYCOBACTERIA): Acid Fast Culture: NEGATIVE

## 2024-01-03 ENCOUNTER — Other Ambulatory Visit: Payer: Self-pay | Admitting: Physician Assistant

## 2024-01-03 ENCOUNTER — Encounter: Payer: Self-pay | Admitting: Internal Medicine

## 2024-01-03 ENCOUNTER — Telehealth: Payer: Self-pay

## 2024-01-03 DIAGNOSIS — J90 Pleural effusion, not elsewhere classified: Secondary | ICD-10-CM

## 2024-01-03 DIAGNOSIS — C3491 Malignant neoplasm of unspecified part of right bronchus or lung: Secondary | ICD-10-CM

## 2024-01-03 NOTE — Telephone Encounter (Signed)
 Spoke with Brian [name], patient's wife, regarding a MyChart message sent.  Brian [name] reported that the patient has been experiencing increased shortness of breath over the past few days but is feeling much better today. Oxygen  is 99 today. She believes the symptoms may have been related to recent high humidity. No other symptoms were reported.  Information was relayed to Cassie, PA, and an order for a chest X-ray was placed. Brian [name] stated that the patient is scheduled to come in on Wednesday, 01/05/24, for labs and will proceed with the chest X-ray at that time.  [name] to call the office or have the patient evaluated in the ER if symptoms worsen.  Brian [name] voiced understanding.

## 2024-01-05 ENCOUNTER — Inpatient Hospital Stay: Attending: Internal Medicine

## 2024-01-05 ENCOUNTER — Ambulatory Visit (HOSPITAL_COMMUNITY)
Admission: RE | Admit: 2024-01-05 | Discharge: 2024-01-05 | Disposition: A | Discharge status: Home / Self Care | Source: Ambulatory Visit | Attending: Physician Assistant | Admitting: Physician Assistant

## 2024-01-05 ENCOUNTER — Telehealth: Payer: Self-pay

## 2024-01-05 DIAGNOSIS — J9811 Atelectasis: Secondary | ICD-10-CM | POA: Diagnosis not present

## 2024-01-05 DIAGNOSIS — C3491 Malignant neoplasm of unspecified part of right bronchus or lung: Secondary | ICD-10-CM | POA: Insufficient documentation

## 2024-01-05 DIAGNOSIS — J90 Pleural effusion, not elsewhere classified: Secondary | ICD-10-CM | POA: Insufficient documentation

## 2024-01-05 DIAGNOSIS — J0110 Acute frontal sinusitis, unspecified: Secondary | ICD-10-CM | POA: Insufficient documentation

## 2024-01-05 DIAGNOSIS — C778 Secondary and unspecified malignant neoplasm of lymph nodes of multiple regions: Secondary | ICD-10-CM | POA: Insufficient documentation

## 2024-01-05 DIAGNOSIS — Z5111 Encounter for antineoplastic chemotherapy: Secondary | ICD-10-CM | POA: Insufficient documentation

## 2024-01-05 DIAGNOSIS — Z982 Presence of cerebrospinal fluid drainage device: Secondary | ICD-10-CM | POA: Diagnosis not present

## 2024-01-05 DIAGNOSIS — R0602 Shortness of breath: Secondary | ICD-10-CM | POA: Diagnosis not present

## 2024-01-05 DIAGNOSIS — T451X5A Adverse effect of antineoplastic and immunosuppressive drugs, initial encounter: Secondary | ICD-10-CM | POA: Insufficient documentation

## 2024-01-05 DIAGNOSIS — Z5112 Encounter for antineoplastic immunotherapy: Secondary | ICD-10-CM | POA: Insufficient documentation

## 2024-01-05 DIAGNOSIS — Z79899 Other long term (current) drug therapy: Secondary | ICD-10-CM | POA: Insufficient documentation

## 2024-01-05 DIAGNOSIS — C3411 Malignant neoplasm of upper lobe, right bronchus or lung: Secondary | ICD-10-CM | POA: Insufficient documentation

## 2024-01-05 DIAGNOSIS — C7931 Secondary malignant neoplasm of brain: Secondary | ICD-10-CM | POA: Insufficient documentation

## 2024-01-05 DIAGNOSIS — D701 Agranulocytosis secondary to cancer chemotherapy: Secondary | ICD-10-CM | POA: Insufficient documentation

## 2024-01-05 LAB — CBC WITH DIFFERENTIAL (CANCER CENTER ONLY)
Abs Immature Granulocytes: 0.03 10*3/uL (ref 0.00–0.07)
Basophils Absolute: 0 10*3/uL (ref 0.0–0.1)
Basophils Relative: 0 %
Eosinophils Absolute: 0.2 10*3/uL (ref 0.0–0.5)
Eosinophils Relative: 3 %
HCT: 34.3 % — ABNORMAL LOW (ref 39.0–52.0)
Hemoglobin: 11 g/dL — ABNORMAL LOW (ref 13.0–17.0)
Immature Granulocytes: 1 %
Lymphocytes Relative: 8 %
Lymphs Abs: 0.5 10*3/uL — ABNORMAL LOW (ref 0.7–4.0)
MCH: 29.3 pg (ref 26.0–34.0)
MCHC: 32.1 g/dL (ref 30.0–36.0)
MCV: 91.5 fL (ref 80.0–100.0)
Monocytes Absolute: 0.8 10*3/uL (ref 0.1–1.0)
Monocytes Relative: 13 %
Neutro Abs: 4.5 10*3/uL (ref 1.7–7.7)
Neutrophils Relative %: 75 %
Platelet Count: 172 10*3/uL (ref 150–400)
RBC: 3.75 MIL/uL — ABNORMAL LOW (ref 4.22–5.81)
RDW: 15.2 % (ref 11.5–15.5)
WBC Count: 6 10*3/uL (ref 4.0–10.5)
nRBC: 0 % (ref 0.0–0.2)

## 2024-01-05 LAB — CMP (CANCER CENTER ONLY)
ALT: 23 U/L (ref 0–44)
AST: 14 U/L — ABNORMAL LOW (ref 15–41)
Albumin: 3.3 g/dL — ABNORMAL LOW (ref 3.5–5.0)
Alkaline Phosphatase: 93 U/L (ref 38–126)
Anion gap: 4 — ABNORMAL LOW (ref 5–15)
BUN: 16 mg/dL (ref 6–20)
CO2: 36 mmol/L — ABNORMAL HIGH (ref 22–32)
Calcium: 8.9 mg/dL (ref 8.9–10.3)
Chloride: 100 mmol/L (ref 98–111)
Creatinine: 0.51 mg/dL — ABNORMAL LOW (ref 0.61–1.24)
GFR, Estimated: >60 mL/min (ref >60)
Glucose, Bld: 157 mg/dL — ABNORMAL HIGH (ref 70–99)
Potassium: 3.7 mmol/L (ref 3.5–5.1)
Sodium: 140 mmol/L (ref 135–145)
Total Bilirubin: 0.2 mg/dL (ref 0.0–1.2)
Total Protein: 6.1 g/dL — ABNORMAL LOW (ref 6.5–8.1)

## 2024-01-05 NOTE — Telephone Encounter (Signed)
 Attempted to reach patient by phone; call went straight to voicemail.   Spoke with patient's wife, Thersia Flax, and informed her of the patient's chest X-ray results. Per Cassie, PA, there is no significant effusion requiring drainage and no acute infection noted. Thersia Flax expressed appreciation for the call and will relay the information to the patient.

## 2024-01-09 NOTE — Progress Notes (Unsigned)
 [facility] OFFICE PROGRESS NOTE  [name], MD [address] [place] [address]  DIAGNOSIS:  Metastatic non-small cell lung cancer initially diagnosed as stage IIIB (T1c, N3, M0) non-small cell lung cancer, adenocarcinoma presented with right upper lobe lung mass in addition to mediastinal and bilateral supraclavicular lymphadenopathy diagnosed in January 2020.  He had evidence of disease recurrence in June 2021 with adenopathy in the subcarinal, level 3 left neck lymph node, and lymph node at the curious of the diaphragm and in the upper abdomen.   PD-L1: 10%   Guardant 360 molecular studies showed no actionable mutation   Foundation One Testing:   Biomarker Findings Microsatellite status - MS-Stable Tumor Mutational Burden - 4 Muts/Mb Genomic Findings For a complete list of the genes assayed, please refer to the Appendix. ALK EML4-ALK fusion (Variant 2) SMARCB1 R377C CTNNB1 S45P CDKN2A/B CDKN2B loss, CDKN2A loss CXCR4 W106* TP53 splice site 672G>A 7 Disease relevant genes with no reportable alterations: BRAF, EGFR, ERBB2, KRAS, MET, RET, ROS1  PRIOR THERAPY: 1) Concurrent chemoradiation with weekly carboplatin  for AUC of 2 and paclitaxel  45 mg/M2.  Status post 6 cycles.  Last dose was given on October 10, 2018 with stable disease. 2) Radiation treatment to the enlarging right cervical lymph nodes under the care of Dr. Eloise Hake. First treatment 03/06/2019. Last treatment scheduled on 04/13/2019 3) Consolidation treatment with immunotherapy with Imfinzi  10 mg/KG every 2 weeks.  First dose November 17, 2018.  Status post 26 cycles. 4) Systemic chemotherapy with carboplatin  for an AUC of 5, Alimta  500 mg/m2, and Keytruda  200 mg IV every 3 weeks. First dose expected on 02/14/20. Status post 1 cycle.  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 5) SBRT to the left lower lobe lung  nodule. 6) [name] 600 mg p.o. twice daily.  He started the first dose on March 08, 2020.   Status post 44 months of treatment.  Discontinued in March 2025 due to disease progression 7) [name] 100 mg daily.  Discontinued in April 2025 due to severe intolerance due to mental status changes/confusion  8) Radiation to the bilateral cervical lymph nodes under the care of Dr. Jeryl Moris last day expected on 11/17/2023   CURRENT THERAPY: Palliative systemic chemotherapy with carboplatin  for an AUC of 4 (dose reduced), Alimta  400 mg/m (dose reduced), and [name]  IV every 3 weeks.  First dose expected on 12/23/2023.  His dose will be increased to carboplatin  for an AUC of 5 and Alimta  500 mg starting from cycle #2.  INTERVAL HISTORY: Brian [name] 63 y.o. male returns to the clinic today for a follow-up visit accompanied by his wife. The patient was last seen in the clinic 2 weeks ago. He was found to have disease progression in March 2025.  Therefore, Dr. Marguerita Shih discontinued his treatment with [name]  and he was started on [name] .  He had nausea and vomiting after 1 dose and he discontinued his medication for 2 weeks.  At his follow up appointment he was re-started on treatment but unfortunately presented to the hospital secondary to mental status changes with hallucinations from his [name] .  He was also treated for pneumonia.      Options then were discussed. Considering alternative treatments including brigatinib (however Dr. Marguerita Shih discussed concerns with respiratory side effects), reduced dose lorlatinib , and chemotherapy. He saw [facility] for clinical trials or alternative therapies.  [facility] recommended palliative systemic  chemotherapy. He underwent his first cycle 3 weeks ago with reduced dose chemotherapy. He tolerated it fair. He had some fatigue, weakness, and continued poor appetite and weight loss. Dr. Marguerita Shih prescribed him a medrol  dose pack.   The patient states he is  feeling significantly better since last being seen. He  with an improvement in appetite and increased activity levels. He experienced weakness with the initial treatment, but this has resolved. No nausea, chills, night sweats, or fever during this period.  He is currently taking Remeron  and has maintained his weight, which is an improvement as he had previously lost weight.   He has been able to reduce his oxygen  use, going several hours a day without it. No chest pain, cough, or hemoptysis, and his cough has decreased by approximately 75%. He denies any rashes, headaches, or vision changes, but notes that he still bruises easily.  His red blood cell count is 11.4, and it has been in the 11s for the past three weeks. His labs are checked weekly as he is currently on chemotherapy, which includes carboplatin  and other medications. He is currently on three medications.  He is essentially retired, and his days often blur together, indicating a relaxed lifestyle.  He is here for evaluation and repeat blood work before undergoing cycle #2.   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  MEDICATIONS:  Current Outpatient Medications  Medication Sig Dispense Refill   acetaminophen  (TYLENOL ) 500 MG tablet Take 1,000 mg by mouth every 6  (six) hours as needed (for pain).     albuterol  (VENTOLIN  HFA) 108 (90 Base) MCG/ACT inhaler INHALE 2 PUFFS INTO THE LUNGS EVERY 4 (FOUR) HOURS AS NEEDED FOR WHEEZING OR SHORTNESS OF BREATH. 6.7 each 4   amLODipine  (NORVASC ) 5 MG tablet Take 0.5 tablets (2.5 mg total) by mouth in the morning. 30 tablet 2   atorvastatin  (LIPITOR) 20 MG tablet Take 20 mg by mouth in the morning.     baclofen  (LIORESAL ) 10 MG tablet Take 10 mg by mouth at bedtime.     cyclobenzaprine  (FLEXERIL ) 5 MG tablet TAKE 1 TABLET BY MOUTH THREE TIMES A DAY AS NEEDED FOR MUSCLE SPASMS 30 tablet 0   dexamethasone  (DECADRON ) 4 MG tablet Take 1 tablet twice a day the day before, the day of, and the day after chemotherapy. 40 tablet 2   DULoxetine  (CYMBALTA ) 60 MG capsule Take 1 capsule (60 mg total) by mouth daily. 90 capsule 3   fluticasone  (FLONASE ) 50 MCG/ACT nasal spray Place 1 spray into both nostrils daily. 1 mL 2   folic acid  (FOLVITE ) 1 MG tablet Take 1 tablet (1 mg total) by mouth daily. 30 tablet 2   levothyroxine  (SYNTHROID ) 100 MCG tablet Take 100 mcg  by mouth daily before breakfast.     methylPREDNISolone  (MEDROL  DOSEPAK) 4 MG TBPK tablet Use as instructed 21 tablet 0   MIRALAX  17 GM/SCOOP powder Take 17 g by mouth daily as needed for mild constipation.     mirtazapine  (REMERON ) 15 MG tablet TAKE 1 TABLET BY MOUTH EVERYDAY AT BEDTIME 90 tablet 1   prochlorperazine  (COMPAZINE ) 10 MG tablet Take 1 tablet (10 mg total) by mouth every 6 (six) hours as needed. 30 tablet 2   TRELEGY ELLIPTA  100-62.5-25 MCG/INH AEPB Inhale 1 puff into the lungs in the morning.     venlafaxine  (EFFEXOR ) 37.5 MG tablet Take 37.5 mg by mouth in the morning.     No current facility-administered medications for this visit.    SURGICAL HISTORY:  Past Surgical History:  Procedure Laterality Date   COLONOSCOPY     FEMUR FRACTURE SURGERY Left 1969   INGUINAL HERNIA REPAIR Right 06/24/2020   Procedure: RIGHT OPEN INGUINAL HERNIA REPAIR WITH  MESH;  Surgeon: Kinsinger, Alphonso Aschoff, MD;  Location: [facility];  Service: General;  Laterality: Right;   IR IVC FILTER PLMT / S&I Dan Dun GUID/MOD SED  03/04/2020   IR IVC FILTER RETRIEVAL / S&I Dan Dun GUID/MOD SED  08/15/2020   IR RADIOLOGIST EVAL & MGMT  08/06/2020   SPINAL CORD STIMULATOR INSERTION N/A 10/05/2023   Procedure: CERVICAL SPINAL CORD STIMULATOR PLACEMENT;  Surgeon: Carroll Clamp, MD;  Location: [facility] ORS;  Service: Neurosurgery;  Laterality: N/A;   THORACENTESIS Right 12/10/2023   Procedure: THORACENTESIS;  Surgeon: [name], MD;  Location: [facility] ENDOSCOPY;  Service: Pulmonary;  Laterality: Right;    REVIEW OF SYSTEMS:   Review of Systems  Constitutional: Improving fatigue and improving appetite. Negative for , chills, fever and unexpected weight change.  HENT: Negative for mouth sores, nosebleeds, sore throat and trouble swallowing.   Eyes: Negative for eye problems and icterus.  Respiratory: Improving dyspnea on exertion. Negative for cough, hemoptysis, and wheezing.   Cardiovascular: Negative for chest pain and leg swelling.  Gastrointestinal: Negative for abdominal pain, constipation, diarrhea, nausea and vomiting.  Genitourinary: Negative for bladder incontinence, difficulty urinating, dysuria, frequency and hematuria.   Musculoskeletal: Negative for back pain, gait problem, neck pain and neck stiffness.  Skin: Negative for itching and rash.  Neurological: Right upper extremity weakness. Negative for dizziness, gait problem, headaches, light-headedness and seizures.  Hematological: Negative for adenopathy. Does not bleed easily. Positive for easy bruising.  Psychiatric/Behavioral: Negative for confusion, depression and sleep disturbance. The patient is not nervous/anxious.     PHYSICAL EXAMINATION:  Blood pressure (!) 131/90, pulse (!) 109, temperature 97.8 F (36.6 C), temperature source Temporal, resp. rate 16, weight 128 lb 8 oz (58.3 kg), SpO2 100%.  ECOG  PERFORMANCE STATUS: 2  Physical Exam  Constitutional: Oriented to person, place, and time and chronically ill appearing male and in no distress.  HENT:  Head: Normocephalic and atraumatic.  Mouth/Throat: Oropharynx is clear and moist. No oropharyngeal exudate.  Eyes: Conjunctivae are normal. Right eye exhibits no discharge. Left eye exhibits no discharge. No scleral icterus.  Neck: Normal range of motion. Neck supple.  Cardiovascular: Normal rate, regular rhythm, normal heart sounds and intact distal pulses.   Pulmonary/Chest: Effort normal. Decreased breath sounds in right lower lung. No respiratory distress. No wheezes. No rales. On supplemental oxygen .  Abdominal: Soft. Bowel sounds are normal. Exhibits no distension and no mass. There is no tenderness.  Musculoskeletal: Flaccid right upper extremity. RUE edema and flaccid weakness.  Lymphadenopathy:    Bilateral cervical lymphadenopathy.  Neurological: Alert and oriented to person, place, and time. Exhibits muscle wasting.  Skin: Skin is warm and dry. Positive for some RUE bruising. No rash noted. Not diaphoretic. No erythema. No pallor.  Psychiatric: Mood, memory and judgment normal.  Vitals reviewed.  LABORATORY DATA: Lab Results  Component Value Date   WBC 7.6 01/11/2024   HGB 11.4 (L) 01/11/2024   HCT 35.6 (L) 01/11/2024   MCV 90.6 01/11/2024   PLT 322 01/11/2024      Chemistry      Component Value Date/Time   NA 140 01/05/2024 1426   K 3.7 01/05/2024 1426   CL 100 01/05/2024 1426   CO2 36 (H) 01/05/2024 1426   BUN 16 01/05/2024 1426   CREATININE 0.51 (L) 01/05/2024 1426      Component Value Date/Time   CALCIUM  8.9 01/05/2024 1426   ALKPHOS 93 01/05/2024 1426   AST 14 (L) 01/05/2024 1426   ALT 23 01/05/2024 1426   BILITOT 0.2 01/05/2024 1426       RADIOGRAPHIC STUDIES:  DG Chest 2 View Result Date: 01/05/2024 CLINICAL DATA:  Increased shortness of breath EXAM: CHEST - 2 VIEW COMPARISON:  Dec 10, 2023  FINDINGS: Chronic right mid lung atelectatic changes, chronic right lower lobe atelectasis with a chronic right pleural reaction no significant effusion. No acute infiltrates or consolidations Ventriculoperitoneal shunt catheter projects over the left hemithorax Heart normal size IMPRESSION: Chronic right lung changes as described without evidence of acute cardiopulmonary infiltrates Electronically Signed   By: Fredrich Jefferson M.D.   On: 01/05/2024 14:54     ASSESSMENT/PLAN:  This is a very pleasant 63 year old Caucasian male with recurrent lung cancer initially diagnosed with stage IIIb non-small cell lung cancer, adenocarcinoma.  He presented with a right upper lobe lung mass in addition to mediastinal and bilateral supraclavicular lymphadenopathy.  He was diagnosed in January 2020.  His PDL 1 expression is 10% and he has no actionable mutations.    He underwent a course of concurrent chemoradiation with weekly carboplatin  and paclitaxel . He is status post 6 cycles.  He tolerated well except for fatigue and odynophagia.    He completed his 26 cycles of consolidation immunotherapy with Imfinzi  10 mg/kg IV every 2 weeks. He tolerated it well without any adverse side effects except has a very mild skin rash over his right cervical area. He completed radiation to the enlarging right cervical lymph nodes. His last radiation treatment was on 04/13/2019.   He received 1 cycle of systemic chemotherapy with carboplatin , Alimta  and Keytruda . Repeat tissue biopsy from the left supraclavicular lymph nodes and molecular studies showed that the patient has positive ALK gene translocation. His previous molecular studies by [employer] 360 was negative. Dr. Marguerita Shih discontinue his systemic chemotherapy for now because of the new findings on the molecular studies.    The patient has been on treatment with [name]  600 mg p.o. twice daily.  He is status post 43 months of treatment.    He unfortunately was recently found to  have evidence of disease progression and his treatment was switched to 100 mg of [name]  p.o. daily.  He started this on 10/20/2023.    He completed palliative radiation to the cervical lymph nodes on 11/17/23.   The patient had significant intolerance to [name]  and he was recently hospitalized with hallucinations. He was discharged on 11/22/23.    At his last appointment, we discussed options with brigatinib (however Dr. Marguerita Shih discussed concerns  with respiratory side effects), reduced dose lorlatinib , and chemotherapy.     The patient saw [facility] for second opinion who recommends chemotherapy.    He is currently undergoing systemic chemotherapy with carboplatin  for an AUC of 4 (dose reduced) and Alimta  400 mg/m (dose reduced), and [name]  IV every 3 weeks. He is status post his first cycle of treatment and has tolerated it fairly well.   Labs were reviewed. Recommend that he proceed with cycle #2 today as scheduled.  Discussed his doses with Dr. Marguerita Shih.  Dr. Marguerita Shih will continue him on full dose chemo with carboplatin  for an AUC of 5, Alimta  500 mg/m, and [name]  with cycle #2.  We will continue to monitor his labs closely on a weekly basis.   Patient saw pulmonary medicine and he is not interested in a Pleurx at this time.  He undergoes thoracentesis on as needed basis the most recent being on 12/10/2023. He knows to contact them if he feels like he requires another thoracentesis.    He will continue taking Remeron  for his decreased appetite.  His appetite seems to be improving.   He seems to be requiring less supplemental oxygen  and his energy is improving.   He was instructed to use his Zofran  and Compazine  if needed for nausea and vomiting.  He has not had any nausea or vomiting.   The patient was advised to call immediately if he has any concerning symptoms in the interval. The patient voices understanding of current disease status and treatment options and is in agreement with  the current care plan. All questions were answered. The patient knows to call the clinic with any problems, questions or concerns. We can certainly see the patient much sooner if necessary      No orders of the defined types were placed in this encounter.    The total time spent in the appointment was 20-29 minutes  Tonita Bills L Michaiah Holsopple, PA-C 01/11/24

## 2024-01-10 MED FILL — Fosaprepitant Dimeglumine For IV Infusion 150 MG (Base Eq): INTRAVENOUS | Qty: 5 | Status: AC

## 2024-01-11 ENCOUNTER — Other Ambulatory Visit: Payer: Self-pay

## 2024-01-11 ENCOUNTER — Inpatient Hospital Stay

## 2024-01-11 ENCOUNTER — Inpatient Hospital Stay (HOSPITAL_BASED_OUTPATIENT_CLINIC_OR_DEPARTMENT_OTHER): Admitting: Physician Assistant

## 2024-01-11 VITALS — BP 131/90 | HR 109 | Temp 97.8°F | Resp 16 | Wt 128.5 lb

## 2024-01-11 DIAGNOSIS — C3491 Malignant neoplasm of unspecified part of right bronchus or lung: Secondary | ICD-10-CM

## 2024-01-11 DIAGNOSIS — Z5111 Encounter for antineoplastic chemotherapy: Secondary | ICD-10-CM

## 2024-01-11 DIAGNOSIS — Z5112 Encounter for antineoplastic immunotherapy: Secondary | ICD-10-CM | POA: Diagnosis not present

## 2024-01-11 DIAGNOSIS — T451X5A Adverse effect of antineoplastic and immunosuppressive drugs, initial encounter: Secondary | ICD-10-CM | POA: Diagnosis not present

## 2024-01-11 DIAGNOSIS — C3411 Malignant neoplasm of upper lobe, right bronchus or lung: Secondary | ICD-10-CM | POA: Diagnosis not present

## 2024-01-11 DIAGNOSIS — Z79899 Other long term (current) drug therapy: Secondary | ICD-10-CM | POA: Diagnosis not present

## 2024-01-11 DIAGNOSIS — C7931 Secondary malignant neoplasm of brain: Secondary | ICD-10-CM | POA: Diagnosis not present

## 2024-01-11 DIAGNOSIS — D701 Agranulocytosis secondary to cancer chemotherapy: Secondary | ICD-10-CM | POA: Diagnosis not present

## 2024-01-11 DIAGNOSIS — C778 Secondary and unspecified malignant neoplasm of lymph nodes of multiple regions: Secondary | ICD-10-CM | POA: Diagnosis not present

## 2024-01-11 DIAGNOSIS — J0110 Acute frontal sinusitis, unspecified: Secondary | ICD-10-CM | POA: Diagnosis not present

## 2024-01-11 LAB — CBC WITH DIFFERENTIAL (CANCER CENTER ONLY)
Abs Immature Granulocytes: 0.02 10*3/uL (ref 0.00–0.07)
Basophils Absolute: 0 10*3/uL (ref 0.0–0.1)
Basophils Relative: 0 %
Eosinophils Absolute: 0 10*3/uL (ref 0.0–0.5)
Eosinophils Relative: 0 %
HCT: 35.6 % — ABNORMAL LOW (ref 39.0–52.0)
Hemoglobin: 11.4 g/dL — ABNORMAL LOW (ref 13.0–17.0)
Immature Granulocytes: 0 %
Lymphocytes Relative: 5 %
Lymphs Abs: 0.4 10*3/uL — ABNORMAL LOW (ref 0.7–4.0)
MCH: 29 pg (ref 26.0–34.0)
MCHC: 32 g/dL (ref 30.0–36.0)
MCV: 90.6 fL (ref 80.0–100.0)
Monocytes Absolute: 1.3 10*3/uL — ABNORMAL HIGH (ref 0.1–1.0)
Monocytes Relative: 17 %
Neutro Abs: 5.8 10*3/uL (ref 1.7–7.7)
Neutrophils Relative %: 78 %
Platelet Count: 322 10*3/uL (ref 150–400)
RBC: 3.93 MIL/uL — ABNORMAL LOW (ref 4.22–5.81)
RDW: 15.3 % (ref 11.5–15.5)
WBC Count: 7.6 10*3/uL (ref 4.0–10.5)
nRBC: 0 % (ref 0.0–0.2)

## 2024-01-11 LAB — CMP (CANCER CENTER ONLY)
ALT: 16 U/L (ref 0–44)
AST: 12 U/L — ABNORMAL LOW (ref 15–41)
Albumin: 3.5 g/dL (ref 3.5–5.0)
Alkaline Phosphatase: 102 U/L (ref 38–126)
Anion gap: 8 (ref 5–15)
BUN: 8 mg/dL (ref 6–20)
CO2: 29 mmol/L (ref 22–32)
Calcium: 9.3 mg/dL (ref 8.9–10.3)
Chloride: 101 mmol/L (ref 98–111)
Creatinine: 0.48 mg/dL — ABNORMAL LOW (ref 0.61–1.24)
GFR, Estimated: >60 mL/min (ref >60)
Glucose, Bld: 168 mg/dL — ABNORMAL HIGH (ref 70–99)
Potassium: 4 mmol/L (ref 3.5–5.1)
Sodium: 138 mmol/L (ref 135–145)
Total Bilirubin: 0.3 mg/dL (ref 0.0–1.2)
Total Protein: 6.5 g/dL (ref 6.5–8.1)

## 2024-01-11 LAB — TOTAL PROTEIN, URINE DIPSTICK: Protein, ur: NEGATIVE mg/dL

## 2024-01-11 MED ORDER — SODIUM CHLORIDE 0.9 % IV SOLN
546.0000 mg | Freq: Once | INTRAVENOUS | Status: AC
  Administered 2024-01-11: 550 mg via INTRAVENOUS
  Filled 2024-01-11: qty 55

## 2024-01-11 MED ORDER — PALONOSETRON HCL INJECTION 0.25 MG/5ML
0.2500 mg | Freq: Once | INTRAVENOUS | Status: AC
  Administered 2024-01-11: 0.25 mg via INTRAVENOUS
  Filled 2024-01-11: qty 5

## 2024-01-11 MED ORDER — SODIUM CHLORIDE 0.9 % IV SOLN: INTRAVENOUS | Status: DC

## 2024-01-11 MED ORDER — DEXAMETHASONE SODIUM PHOSPHATE 10 MG/ML IJ SOLN
10.0000 mg | Freq: Once | INTRAMUSCULAR | Status: AC
  Administered 2024-01-11: 10 mg via INTRAVENOUS
  Filled 2024-01-11: qty 1

## 2024-01-11 MED ORDER — SODIUM CHLORIDE 0.9 % IV SOLN
500.0000 mg/m2 | Freq: Once | INTRAVENOUS | Status: AC
  Administered 2024-01-11: 900 mg via INTRAVENOUS
  Filled 2024-01-11: qty 20

## 2024-01-11 MED ORDER — SODIUM CHLORIDE 0.9 % IV SOLN
15.0000 mg/kg | Freq: Once | INTRAVENOUS | Status: AC
  Administered 2024-01-11: 900 mg via INTRAVENOUS
  Filled 2024-01-11: qty 4

## 2024-01-11 MED ORDER — SODIUM CHLORIDE 0.9 % IV SOLN
150.0000 mg | Freq: Once | INTRAVENOUS | Status: AC
  Administered 2024-01-11: 150 mg via INTRAVENOUS
  Filled 2024-01-11: qty 150

## 2024-01-11 NOTE — Patient Instructions (Signed)
 [facility] - A DEPT OF [facility]  Discharge Instructions: Thank you for choosing [facility] to provide your oncology and hematology care.   If you have a lab appointment with the Cancer Center, please go directly to the Cancer Center and check in at the registration area.   Wear comfortable clothing and clothing appropriate for easy access to any Portacath or PICC line.   We strive to give you quality time with your provider. You may need to reschedule your appointment if you arrive late (15 or more minutes).  Arriving late affects you and other patients whose appointments are after yours.  Also, if you miss three or more appointments without notifying the office, you may be dismissed from the clinic at the provider's discretion.      For prescription refill requests, have your pharmacy contact our office and allow 72 hours for refills to be completed.    Today you received the following chemotherapy and/or immunotherapy agents: bevacizumab , pemetrexed , and carboplatin       To help prevent nausea and vomiting after your treatment, we encourage you to take your nausea medication as directed.  BELOW ARE SYMPTOMS THAT SHOULD BE REPORTED IMMEDIATELY: *FEVER GREATER THAN 100.4 F (38 C) OR HIGHER *CHILLS OR SWEATING *NAUSEA AND VOMITING THAT IS NOT CONTROLLED WITH YOUR NAUSEA MEDICATION *UNUSUAL SHORTNESS OF BREATH *UNUSUAL BRUISING OR BLEEDING *URINARY PROBLEMS (pain or burning when urinating, or frequent urination) *BOWEL PROBLEMS (unusual diarrhea, constipation, pain near the anus) TENDERNESS IN MOUTH AND THROAT WITH OR WITHOUT PRESENCE OF ULCERS (sore throat, sores in mouth, or a toothache) UNUSUAL RASH, SWELLING OR PAIN  UNUSUAL VAGINAL DISCHARGE OR ITCHING   Items with * indicate a potential emergency and should be followed up as soon as possible or go to the Emergency Department if any problems should occur.  Please show the  CHEMOTHERAPY ALERT CARD or IMMUNOTHERAPY ALERT CARD at check-in to the Emergency Department and triage nurse.  Should you have questions after your visit or need to cancel or reschedule your appointment, please contact [facility] - A DEPT OF [facility]  Dept: [phone]  and follow the prompts.  Office hours are 8:00 a.m. to 4:30 p.m. Monday - Friday. Please note that voicemails left after 4:00 p.m. may not be returned until the following business day.  We are closed weekends and major holidays. You have access to a nurse at all times for urgent questions. Please call the main number to the clinic Dept: [phone] and follow the prompts.   For any non-urgent questions, you may also contact your provider using MyChart. We now offer e-Visits for anyone 44 and older to request care online for non-urgent symptoms. For details visit [email or web address].   Also download the MyChart app! Go to the app store, search "MyChart", open the app, select [facility], and log in with your MyChart username and password.

## 2024-01-18 ENCOUNTER — Encounter: Payer: Self-pay | Admitting: Internal Medicine

## 2024-01-19 ENCOUNTER — Encounter: Payer: Self-pay | Admitting: Medical Oncology

## 2024-01-19 ENCOUNTER — Inpatient Hospital Stay (HOSPITAL_BASED_OUTPATIENT_CLINIC_OR_DEPARTMENT_OTHER): Admitting: Physician Assistant

## 2024-01-19 ENCOUNTER — Inpatient Hospital Stay

## 2024-01-19 ENCOUNTER — Encounter: Payer: Self-pay | Admitting: Internal Medicine

## 2024-01-19 ENCOUNTER — Telehealth: Payer: Self-pay | Admitting: Medical Oncology

## 2024-01-19 VITALS — BP 147/94 | HR 100 | Temp 98.6°F | Resp 18 | Ht 68.0 in | Wt 124.3 lb

## 2024-01-19 DIAGNOSIS — C778 Secondary and unspecified malignant neoplasm of lymph nodes of multiple regions: Secondary | ICD-10-CM | POA: Diagnosis not present

## 2024-01-19 DIAGNOSIS — C3491 Malignant neoplasm of unspecified part of right bronchus or lung: Secondary | ICD-10-CM

## 2024-01-19 DIAGNOSIS — D701 Agranulocytosis secondary to cancer chemotherapy: Secondary | ICD-10-CM

## 2024-01-19 DIAGNOSIS — C7931 Secondary malignant neoplasm of brain: Secondary | ICD-10-CM | POA: Diagnosis not present

## 2024-01-19 DIAGNOSIS — Z79899 Other long term (current) drug therapy: Secondary | ICD-10-CM | POA: Diagnosis not present

## 2024-01-19 DIAGNOSIS — T451X5A Adverse effect of antineoplastic and immunosuppressive drugs, initial encounter: Secondary | ICD-10-CM | POA: Diagnosis not present

## 2024-01-19 DIAGNOSIS — J0110 Acute frontal sinusitis, unspecified: Secondary | ICD-10-CM

## 2024-01-19 DIAGNOSIS — C3411 Malignant neoplasm of upper lobe, right bronchus or lung: Secondary | ICD-10-CM | POA: Diagnosis not present

## 2024-01-19 DIAGNOSIS — Z5112 Encounter for antineoplastic immunotherapy: Secondary | ICD-10-CM | POA: Diagnosis not present

## 2024-01-19 DIAGNOSIS — Z5111 Encounter for antineoplastic chemotherapy: Secondary | ICD-10-CM | POA: Diagnosis not present

## 2024-01-19 LAB — CMP (CANCER CENTER ONLY)
ALT: 19 U/L (ref 0–44)
AST: 15 U/L (ref 15–41)
Albumin: 3.6 g/dL (ref 3.5–5.0)
Alkaline Phosphatase: 102 U/L (ref 38–126)
Anion gap: 7 (ref 5–15)
BUN: 11 mg/dL (ref 6–20)
CO2: 32 mmol/L (ref 22–32)
Calcium: 9.2 mg/dL (ref 8.9–10.3)
Chloride: 99 mmol/L (ref 98–111)
Creatinine: 0.42 mg/dL — ABNORMAL LOW (ref 0.61–1.24)
GFR, Estimated: >60 mL/min (ref >60)
Glucose, Bld: 107 mg/dL — ABNORMAL HIGH (ref 70–99)
Potassium: 3.8 mmol/L (ref 3.5–5.1)
Sodium: 138 mmol/L (ref 135–145)
Total Bilirubin: 0.2 mg/dL (ref 0.0–1.2)
Total Protein: 6.6 g/dL (ref 6.5–8.1)

## 2024-01-19 LAB — CBC WITH DIFFERENTIAL (CANCER CENTER ONLY)
Abs Immature Granulocytes: 0 10*3/uL (ref 0.00–0.07)
Basophils Absolute: 0 10*3/uL (ref 0.0–0.1)
Basophils Relative: 1 %
Eosinophils Absolute: 0.1 10*3/uL (ref 0.0–0.5)
Eosinophils Relative: 2 %
HCT: 38.9 % — ABNORMAL LOW (ref 39.0–52.0)
Hemoglobin: 12.8 g/dL — ABNORMAL LOW (ref 13.0–17.0)
Immature Granulocytes: 0 %
Lymphocytes Relative: 17 %
Lymphs Abs: 0.5 10*3/uL — ABNORMAL LOW (ref 0.7–4.0)
MCH: 29.4 pg (ref 26.0–34.0)
MCHC: 32.9 g/dL (ref 30.0–36.0)
MCV: 89.4 fL (ref 80.0–100.0)
Monocytes Absolute: 0.6 10*3/uL (ref 0.1–1.0)
Monocytes Relative: 21 %
Neutro Abs: 1.8 10*3/uL (ref 1.7–7.7)
Neutrophils Relative %: 59 %
Platelet Count: 90 10*3/uL — ABNORMAL LOW (ref 150–400)
RBC: 4.35 MIL/uL (ref 4.22–5.81)
RDW: 14.9 % (ref 11.5–15.5)
WBC Count: 3 10*3/uL — ABNORMAL LOW (ref 4.0–10.5)
nRBC: 0 % (ref 0.0–0.2)

## 2024-01-19 MED ORDER — AMOXICILLIN-POT CLAVULANATE 875-125 MG PO TABS: 1.0000 | ORAL_TABLET | Freq: Two times a day (BID) | ORAL | 0 refills | Status: AC

## 2024-01-19 NOTE — Telephone Encounter (Signed)
 Wife contacted re pts sinus issues. She said Daelan's symptoms are headache, heavy nasal drainage that is yellowish -green, He has soreness inside his nose and left sided facial pain hs. I told wife for [place] to keep his appt today.

## 2024-01-19 NOTE — Progress Notes (Signed)
 Symptom Management Consult Note [facility]    Patient Care Team: [name], MD as PCP - General (Family Medicine) [name], MD (Family Medicine)    Name / MRN / DOB: Brian [name]  [id number]  23-Aug-1960   Date of visit: 01/19/2024   Chief Complaint/Reason for visit: sins pressure   Current Therapy: bevacizumab -awwb, carboplatin , and pemetrexed  disodium  Last treatment:  Day 1   Cycle 2 on 01/11/24    ASSESSMENT AND PLAN Patient is a 63 y.o. male with oncologic history of Metastatic non-small cell lung cancer initially diagnosed as stage IIIB (T1c, N3, M0) non-small cell lung cancer, adenocarcinoma followed by Dr. Marguerita Shih.  I have viewed most recent oncology note and lab work.  #Metastatic non-small cell lung cancer initially diagnosed as stage IIIB (T1c, N3, M0) non-small cell lung cancer, adenocarcinoma   - Next appointment with oncologist is 02/02/24   #Acute sinusitis - Acute sinusitis with facial pressure, sinus drainage, and sore throat.  - Patient here for routine labs today and noted to be neutropenic. With neutropenia patient will need antibiotic coverage. Prescription for Augmentin sent to pharmacy - Recommend adding an antihistamine like Zyrtec or Claritin.   Strict ED precautions discussed should symptoms worsen.   HEME/ONC HISTORY Oncology History  Adenocarcinoma of right lung, stage 3 (HCC)  08/25/2018 Initial Diagnosis   Adenocarcinoma of right lung, stage 3 (HCC)   08/25/2018 Cancer Staging   Staging form: Lung, AJCC 8th Edition - Clinical: Stage IIIB (cT1c, cN3, cM0) - Signed by Marlene Simas, MD on 08/25/2018   09/06/2018 - 10/10/2018 Chemotherapy   The patient had palonosetron  (ALOXI ) injection 0.25 mg, 0.25 mg, Intravenous,  Once, 6 of 7 cycles Administration: 0.25 mg (09/06/2018), 0.25 mg (10/03/2018), 0.25 mg (10/10/2018), 0.25 mg (09/12/2018), 0.25 mg (09/19/2018), 0.25 mg (09/27/2018) CARBOplatin  (PARAPLATIN ) 290 mg  in sodium chloride  0.9 % 250 mL chemo infusion, 290 mg (100 % of original dose 285 mg), Intravenous,  Once, 6 of 7 cycles Dose modification: 285 mg (original dose 285 mg, Cycle 1) Administration: 290 mg (09/06/2018), 290 mg (10/03/2018), 290 mg (10/10/2018), 290 mg (09/12/2018), 240 mg (09/19/2018), 290 mg (09/27/2018) PACLitaxel  (TAXOL ) 90 mg in sodium chloride  0.9 % 250 mL chemo infusion (</= 80mg /m2), 45 mg/m2 = 90 mg, Intravenous,  Once, 6 of 7 cycles Administration: 90 mg (09/06/2018), 90 mg (10/03/2018), 90 mg (10/10/2018), 90 mg (09/12/2018), 90 mg (09/19/2018), 90 mg (09/27/2018)  for chemotherapy treatment.    11/17/2018 - 11/02/2019 Chemotherapy   The patient had durvalumab  (IMFINZI ) 860 mg in sodium chloride  0.9 % 100 mL chemo infusion, 10 mg/kg = 860 mg, Intravenous,  Once, 26 of 26 cycles Administration: 860 mg (11/17/2018), 860 mg (12/01/2018), 860 mg (12/15/2018), 860 mg (12/29/2018), 860 mg (01/12/2019), 860 mg (01/26/2019), 860 mg (02/09/2019), 860 mg (02/23/2019), 860 mg (03/09/2019), 860 mg (03/23/2019), 860 mg (04/06/2019), 860 mg (04/20/2019), 860 mg (05/04/2019), 860 mg (05/18/2019), 860 mg (06/01/2019), 860 mg (06/15/2019), 860 mg (06/28/2019), 860 mg (07/13/2019), 860 mg (07/27/2019), 860 mg (08/09/2019), 860 mg (08/24/2019), 860 mg (09/07/2019), 860 mg (09/21/2019), 860 mg (10/05/2019), 860 mg (10/19/2019), 860 mg (11/02/2019)  for chemotherapy treatment.    02/14/2020 - 02/14/2020 Chemotherapy   The patient had palonosetron  (ALOXI ) injection 0.25 mg, 0.25 mg, Intravenous,  Once, 1 of 4 cycles Administration: 0.25 mg (02/14/2020) PEMEtrexed  (ALIMTA ) 1,000 mg in sodium chloride  0.9 % 100 mL chemo infusion, 500 mg/m2 = 1,000 mg, Intravenous,  Once, 1 of 6  cycles Administration: 1,000 mg (02/14/2020) CARBOplatin  (PARAPLATIN ) 660 mg in sodium chloride  0.9 % 250 mL chemo infusion, 660 mg (100 % of original dose 663.5 mg), Intravenous,  Once, 1 of 4 cycles Dose modification: 663.5 mg (original dose 663.5 mg, Cycle  1) Administration: 660 mg (02/14/2020) fosaprepitant  (EMEND) 150 mg in sodium chloride  0.9 % 145 mL IVPB, 150 mg, Intravenous,  Once, 1 of 4 cycles Administration: 150 mg (02/14/2020) pembrolizumab  (KEYTRUDA ) 200 mg in sodium chloride  0.9 % 50 mL chemo infusion, 200 mg, Intravenous, Once, 1 of 6 cycles Administration: 200 mg (02/14/2020)  for chemotherapy treatment.    03/08/2020 - 03/08/2020 Chemotherapy   Patient is on Treatment Plan : LUNG [name] ALK positive NSCLC     Adenocarcinoma of right lung, stage 4 (HCC)  02/07/2020 Initial Diagnosis   Adenocarcinoma of right lung, stage 4 (HCC)   02/14/2020 - 02/14/2020 Chemotherapy   The patient had palonosetron  (ALOXI ) injection 0.25 mg, 0.25 mg, Intravenous,  Once, 1 of 4 cycles Administration: 0.25 mg (02/14/2020) PEMEtrexed  (ALIMTA ) 1,000 mg in sodium chloride  0.9 % 100 mL chemo infusion, 500 mg/m2 = 1,000 mg, Intravenous,  Once, 1 of 6 cycles Administration: 1,000 mg (02/14/2020) CARBOplatin  (PARAPLATIN ) 660 mg in sodium chloride  0.9 % 250 mL chemo infusion, 660 mg (100 % of original dose 663.5 mg), Intravenous,  Once, 1 of 4 cycles Dose modification: 663.5 mg (original dose 663.5 mg, Cycle 1) Administration: 660 mg (02/14/2020) fosaprepitant  (EMEND) 150 mg in sodium chloride  0.9 % 145 mL IVPB, 150 mg, Intravenous,  Once, 1 of 4 cycles Administration: 150 mg (02/14/2020) pembrolizumab  (KEYTRUDA ) 200 mg in sodium chloride  0.9 % 50 mL chemo infusion, 200 mg, Intravenous, Once, 1 of 6 cycles Administration: 200 mg (02/14/2020)  for chemotherapy treatment.    03/08/2020 - 03/08/2020 Chemotherapy   Patient is on Treatment Plan : LUNG [name] ALK positive NSCLC     09/12/2020 Cancer Staging   Staging form: Lung, AJCC 8th Edition - Clinical: Stage IVB (cT1c, cN3, cM1c) - Signed by Marlene Simas, MD on 09/12/2020   12/22/2023 -  Chemotherapy   Patient is on Treatment Plan : LUNG Pemetrexed  + Carboplatin  + Bevacizumab  q21d          INTERVAL  HISTORY  Discussed the use of AI scribe software for clinical note transcription with the patient, who gave verbal consent to proceed.    Brian [name] is a 63 y.o. male with oncologic history as above presenting to [facility] today with chief complaint of sinus pressure. Accompanied to clinic today by spouse who provides additional history.  He has been experiencing facial pressure, heavy sinus drainage, and nasal discharge that is yellow, green, and bloody. The inside of his nose feels raw, and his throat is becoming sore. The pressure worsens when he leans forward, and he experiences pain in his gums, although he does not have any dental issues. These symptoms have been present for approximately four days and are progressively worsening.  He has a history of sinus infections, typically occurring in the summer, fall, and sometimes winter. He has not been on antibiotics recently. He has not tried any over-the-counter treatments like Flonase  for this episode. No fever is present. He reports coughing due to shortness of breath from his cancer but not related to his sinus symptoms. Denies any sick contacts.       ROS  All other systems are reviewed and are negative for acute change except as noted in the HPI.    Allergies  Allergen Reactions   Lorazepam  Anxiety    Per MD, patient gets restless/delirious after Ativan  administration.     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    Non-small cell cancer of right lung (HCC) 08/23/2018   Pneumonia    Pre-diabetes    Radiation-induced brachial plexopathy    Recurrent right pleural effusion    Secondary malignant  neoplasm of brain and spinal cord (HCC)    Subarachnoid hemorrhage (HCC) 2016   Voice hoarseness      Past Surgical History:  Procedure Laterality Date   COLONOSCOPY     FEMUR FRACTURE SURGERY Left 1969   INGUINAL HERNIA REPAIR Right 06/24/2020   Procedure: RIGHT OPEN INGUINAL HERNIA REPAIR WITH MESH;  Surgeon: Kinsinger, Alphonso Aschoff, MD;  Location: [facility]S;  Service: General;  Laterality: Right;   IR IVC FILTER PLMT / S&I Dan Dun GUID/MOD SED  03/04/2020   IR IVC FILTER RETRIEVAL / S&I Dan Dun GUID/MOD SED  08/15/2020   IR RADIOLOGIST EVAL & MGMT  08/06/2020   SPINAL CORD STIMULATOR INSERTION N/A 10/05/2023   Procedure: CERVICAL SPINAL CORD STIMULATOR PLACEMENT;  Surgeon: Carroll Clamp, MD;  Location: [facility] ORS;  Service: Neurosurgery;  Laterality: N/A;   THORACENTESIS Right 12/10/2023   Procedure: THORACENTESIS;  Surgeon: [name], MD;  Location: [facility];  Service: Pulmonary;  Laterality: Right;    Social History   Socioeconomic History   Marital status: Married    Spouse name: Thersia Flax   Number of children: 2   Years of education: Not on file   Highest education level: Not on file  Occupational History   Not on file  Tobacco Use   Smoking status: Former    Types: Cigarettes   Smokeless tobacco: Never  Vaping Use   Vaping status: Never Used  Substance and Sexual Activity   Alcohol use: Yes    Alcohol/week: 5.0 standard drinks of alcohol    Types: 5 Standard drinks or equivalent per week    Comment: occasionally   Drug use: No   Sexual activity: Not on file  Other Topics Concern   Not on file  Social History Narrative   Are you right handed or left handed? Right   Are you currently employed ?    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   Who lives with you? wife   What type of home do you live in: 1 story or 2 story? Two    Caffeine  3 cups a day    Social Drivers of Corporate investment banker Strain: Not on file  Food  Insecurity: No Food Insecurity (11/18/2023)   Hunger Vital Sign    Worried About Running Out of Food in the Last Year: Never true    Ran Out of Food in the Last Year: Never true  Transportation Needs: No Transportation Needs (11/18/2023)   PRAPARE - Administrator, Civil Service (Medical): No    Lack of Transportation (Non-Medical): No  Physical Activity: Not on file  Stress: Not on file  Social Connections: Not on file  Intimate Partner Violence: Not At Risk (11/18/2023)   Humiliation, Afraid, Rape, and Kick questionnaire    Fear of Current or Ex-Partner: No    Emotionally Abused: No    Physically Abused: No    Sexually Abused: No    Family History  Problem Relation Age of Onset   Diabetes Mother    Diabetes Brother    Colon cancer Neg Hx    Stomach cancer Neg Hx    Thyroid  cancer Neg Hx      Current Outpatient Medications:    amoxicillin-clavulanate (AUGMENTIN) 875-125 MG tablet, Take 1 tablet by mouth 2 (two) times daily for 7 days., Disp: 14 tablet, Rfl: 0   acetaminophen  (TYLENOL ) 500 MG tablet, Take 1,000 mg by mouth every 6 (six) hours as needed (for pain)., Disp: , Rfl:    albuterol  (VENTOLIN  HFA) 108 (90 Base) MCG/ACT inhaler, INHALE 2 PUFFS INTO THE LUNGS EVERY 4 (FOUR) HOURS AS NEEDED FOR WHEEZING OR SHORTNESS OF BREATH., Disp: 6.7 each, Rfl: 4   amLODipine  (NORVASC ) 5 MG tablet, Take 0.5 tablets (2.5 mg total) by mouth in the morning., Disp: 30 tablet, Rfl: 2   atorvastatin  (LIPITOR) 20 MG tablet, Take 20 mg by mouth in the morning., Disp: , Rfl:    baclofen  (LIORESAL ) 10 MG tablet, Take 10 mg by mouth at bedtime., Disp: , Rfl:    cyclobenzaprine  (FLEXERIL ) 5 MG tablet, TAKE 1 TABLET BY MOUTH THREE TIMES A DAY AS NEEDED FOR MUSCLE SPASMS, Disp: 30 tablet, Rfl: 0   dexamethasone  (DECADRON ) 4 MG tablet, Take 1 tablet twice a day the day before, the day of, and the day after chemotherapy., Disp: 40 tablet, Rfl: 2   DULoxetine  (CYMBALTA ) 60 MG capsule, Take 1  capsule (60 mg total) by mouth daily., Disp: 90 capsule, Rfl: 3   fluticasone  (FLONASE ) 50 MCG/ACT nasal spray, Place 1 spray into both nostrils daily., Disp: 1 mL, Rfl: 2   folic acid  (FOLVITE ) 1 MG tablet, Take 1 tablet (1 mg total) by mouth daily., Disp: 30 tablet, Rfl: 2   levothyroxine  (SYNTHROID ) 100 MCG tablet, Take 100 mcg by mouth daily before breakfast., Disp: , Rfl:    methylPREDNISolone  (MEDROL  DOSEPAK) 4 MG TBPK tablet, Use as instructed, Disp: 21 tablet, Rfl: 0   MIRALAX  17 GM/SCOOP powder, Take 17 g by mouth daily as needed for mild constipation., Disp: , Rfl:    mirtazapine  (REMERON ) 15 MG tablet, TAKE 1 TABLET BY MOUTH EVERYDAY AT BEDTIME, Disp: 90 tablet, Rfl: 1   prochlorperazine  (COMPAZINE ) 10 MG tablet, Take 1 tablet (10 mg total) by mouth every 6 (six) hours as needed., Disp: 30 tablet, Rfl: 2   TRELEGY ELLIPTA  100-62.5-25 MCG/INH AEPB, Inhale 1 puff into the lungs in the morning., Disp: , Rfl:    venlafaxine  (EFFEXOR ) 37.5 MG tablet, Take 37.5 mg by mouth in the morning., Disp: , Rfl:   PHYSICAL EXAM ECOG FS:1 - Symptomatic but completely ambulatory    Vitals:   01/19/24 1450 01/19/24 1500  BP: (!) 147/94   Pulse: (!) 105 100  Resp: 18   Temp: 98.6 F (37 C)   TempSrc: Tympanic   SpO2: 99%   Weight: 124 lb 4.8 oz (56.4 kg)   Height: 5' 8 (1.727 m)    Physical Exam Vitals and nursing note reviewed.  Constitutional:      Appearance: He is not ill-appearing or toxic-appearing.  HENT:     Head: Normocephalic.     Nose:     Right  Sinus: Frontal sinus tenderness present.     Left Sinus: Frontal sinus tenderness present.     Mouth/Throat:     Dentition: Normal dentition.     Pharynx: Oropharynx is clear. Uvula midline. No posterior oropharyngeal erythema.   Eyes:     Conjunctiva/sclera: Conjunctivae normal.    Cardiovascular:     Rate and Rhythm: Regular rhythm. Tachycardia present.     Pulses: Normal pulses.     Heart sounds: Normal heart sounds.   Pulmonary:     Effort: Pulmonary effort is normal.     Breath sounds: Normal breath sounds.  Abdominal:     General: There is no distension.   Musculoskeletal:     Cervical back: Normal range of motion.   Skin:    General: Skin is warm and dry.   Neurological:     Mental Status: He is alert.        LABORATORY DATA I have reviewed the data as listed    Latest Ref Rng & Units 01/19/2024    2:30 PM 01/11/2024    7:41 AM 01/05/2024    2:26 PM  CBC  WBC 4.0 - 10.5 K/uL 3.0  7.6  6.0   Hemoglobin 13.0 - 17.0 g/dL 82.9  56.2  13.0   Hematocrit 39.0 - 52.0 % 38.9  35.6  34.3   Platelets 150 - 400 K/uL 90  322  172         Latest Ref Rng & Units 01/19/2024    2:30 PM 01/11/2024    7:41 AM 01/05/2024    2:26 PM  CMP  Glucose 70 - 99 mg/dL 865  784  696   BUN 6 - 20 mg/dL 11  8  16    Creatinine 0.61 - 1.24 mg/dL 2.95  2.84  1.32   Sodium 135 - 145 mmol/L 138  138  140   Potassium 3.5 - 5.1 mmol/L 3.8  4.0  3.7   Chloride 98 - 111 mmol/L 99  101  100   CO2 22 - 32 mmol/L 32  29  36   Calcium  8.9 - 10.3 mg/dL 9.2  9.3  8.9   Total Protein 6.5 - 8.1 g/dL 6.6  6.5  6.1   Total Bilirubin 0.0 - 1.2 mg/dL 0.2  0.3  0.2   Alkaline Phos 38 - 126 U/L 102  102  93   AST 15 - 41 U/L 15  12  14    ALT 0 - 44 U/L 19  16  23         RADIOGRAPHIC STUDIES (from last 24 hours if applicable) I have personally reviewed the radiological images as listed and agreed with the findings in the report. No results found.      Visit Diagnosis: 1. Adenocarcinoma of right lung, stage 4 (HCC)   2. Chemotherapy-induced neutropenia (HCC)   3. Acute frontal sinusitis, recurrence not specified      No orders of the defined types were placed in this encounter.   All questions were answered. The patient knows to call the clinic with any problems, questions or concerns. No barriers to learning was detected.  A total of more than 30 minutes were spent on this encounter with face-to-face time and  non-face-to-face time, including preparing to see the patient, ordering tests and/or medications, counseling the patient and coordination of care as outlined above.    Thank you for allowing me to participate in the care of this patient.    Devion Chriscoe E  Amaryllis Junior Department of Hematology/Oncology [facility] at [facility] Phone: [phone]  [phone]    01/19/2024 3:39 PM

## 2024-01-23 DIAGNOSIS — J189 Pneumonia, unspecified organism: Secondary | ICD-10-CM | POA: Diagnosis not present

## 2024-01-24 ENCOUNTER — Other Ambulatory Visit: Payer: Self-pay | Admitting: Physician Assistant

## 2024-01-24 ENCOUNTER — Other Ambulatory Visit: Payer: Self-pay | Admitting: Neurology

## 2024-01-24 DIAGNOSIS — M79601 Pain in right arm: Secondary | ICD-10-CM

## 2024-01-24 DIAGNOSIS — M792 Neuralgia and neuritis, unspecified: Secondary | ICD-10-CM

## 2024-01-24 DIAGNOSIS — M6259 Muscle wasting and atrophy, not elsewhere classified, multiple sites: Secondary | ICD-10-CM

## 2024-01-24 DIAGNOSIS — R29898 Other symptoms and signs involving the musculoskeletal system: Secondary | ICD-10-CM

## 2024-01-24 DIAGNOSIS — G540 Brachial plexus disorders: Secondary | ICD-10-CM

## 2024-01-26 ENCOUNTER — Inpatient Hospital Stay

## 2024-01-26 DIAGNOSIS — C3491 Malignant neoplasm of unspecified part of right bronchus or lung: Secondary | ICD-10-CM

## 2024-01-26 DIAGNOSIS — D701 Agranulocytosis secondary to cancer chemotherapy: Secondary | ICD-10-CM | POA: Diagnosis not present

## 2024-01-26 DIAGNOSIS — Z79899 Other long term (current) drug therapy: Secondary | ICD-10-CM | POA: Diagnosis not present

## 2024-01-26 DIAGNOSIS — J0110 Acute frontal sinusitis, unspecified: Secondary | ICD-10-CM | POA: Diagnosis not present

## 2024-01-26 DIAGNOSIS — Z5111 Encounter for antineoplastic chemotherapy: Secondary | ICD-10-CM | POA: Diagnosis not present

## 2024-01-26 DIAGNOSIS — C7931 Secondary malignant neoplasm of brain: Secondary | ICD-10-CM | POA: Diagnosis not present

## 2024-01-26 DIAGNOSIS — C3411 Malignant neoplasm of upper lobe, right bronchus or lung: Secondary | ICD-10-CM | POA: Diagnosis not present

## 2024-01-26 DIAGNOSIS — T451X5A Adverse effect of antineoplastic and immunosuppressive drugs, initial encounter: Secondary | ICD-10-CM | POA: Diagnosis not present

## 2024-01-26 DIAGNOSIS — Z5112 Encounter for antineoplastic immunotherapy: Secondary | ICD-10-CM | POA: Diagnosis not present

## 2024-01-26 DIAGNOSIS — C778 Secondary and unspecified malignant neoplasm of lymph nodes of multiple regions: Secondary | ICD-10-CM | POA: Diagnosis not present

## 2024-01-26 LAB — CMP (CANCER CENTER ONLY)
ALT: 13 U/L (ref 0–44)
AST: 12 U/L — ABNORMAL LOW (ref 15–41)
Albumin: 3.3 g/dL — ABNORMAL LOW (ref 3.5–5.0)
Alkaline Phosphatase: 96 U/L (ref 38–126)
Anion gap: 6 (ref 5–15)
BUN: 9 mg/dL (ref 6–20)
CO2: 32 mmol/L (ref 22–32)
Calcium: 8.7 mg/dL — ABNORMAL LOW (ref 8.9–10.3)
Chloride: 101 mmol/L (ref 98–111)
Creatinine: 0.39 mg/dL — ABNORMAL LOW (ref 0.61–1.24)
GFR, Estimated: >60 mL/min (ref >60)
Glucose, Bld: 110 mg/dL — ABNORMAL HIGH (ref 70–99)
Potassium: 3.5 mmol/L (ref 3.5–5.1)
Sodium: 139 mmol/L (ref 135–145)
Total Bilirubin: 0.2 mg/dL (ref 0.0–1.2)
Total Protein: 6.1 g/dL — ABNORMAL LOW (ref 6.5–8.1)

## 2024-01-26 LAB — CBC WITH DIFFERENTIAL (CANCER CENTER ONLY)
Abs Immature Granulocytes: 0.02 10*3/uL (ref 0.00–0.07)
Basophils Absolute: 0 10*3/uL (ref 0.0–0.1)
Basophils Relative: 0 %
Eosinophils Absolute: 0.1 10*3/uL (ref 0.0–0.5)
Eosinophils Relative: 2 %
HCT: 33.7 % — ABNORMAL LOW (ref 39.0–52.0)
Hemoglobin: 11.1 g/dL — ABNORMAL LOW (ref 13.0–17.0)
Immature Granulocytes: 1 %
Lymphocytes Relative: 11 %
Lymphs Abs: 0.5 10*3/uL — ABNORMAL LOW (ref 0.7–4.0)
MCH: 29.4 pg (ref 26.0–34.0)
MCHC: 32.9 g/dL (ref 30.0–36.0)
MCV: 89.4 fL (ref 80.0–100.0)
Monocytes Absolute: 0.7 10*3/uL (ref 0.1–1.0)
Monocytes Relative: 17 %
Neutro Abs: 2.8 10*3/uL (ref 1.7–7.7)
Neutrophils Relative %: 69 %
Platelet Count: 202 10*3/uL (ref 150–400)
RBC: 3.77 MIL/uL — ABNORMAL LOW (ref 4.22–5.81)
RDW: 15.2 % (ref 11.5–15.5)
WBC Count: 4.1 10*3/uL (ref 4.0–10.5)
nRBC: 0 % (ref 0.0–0.2)

## 2024-01-26 NOTE — Telephone Encounter (Signed)
 Called and left message to call office. Would like to know if he is still taking Duloxetine  It has need a refill for 6 months and the refills ran out in December 2024.

## 2024-01-27 NOTE — Telephone Encounter (Signed)
 Called to see if he needs it  refilled.

## 2024-02-01 MED FILL — Fosaprepitant Dimeglumine For IV Infusion 150 MG (Base Eq): INTRAVENOUS | Qty: 5 | Status: AC

## 2024-02-02 ENCOUNTER — Inpatient Hospital Stay: Attending: Internal Medicine

## 2024-02-02 ENCOUNTER — Inpatient Hospital Stay

## 2024-02-02 ENCOUNTER — Inpatient Hospital Stay (HOSPITAL_BASED_OUTPATIENT_CLINIC_OR_DEPARTMENT_OTHER): Admitting: Internal Medicine

## 2024-02-02 VITALS — HR 105 | Temp 97.5°F

## 2024-02-02 VITALS — BP 145/94 | HR 110 | Temp 99.2°F | Resp 16 | Ht 68.0 in | Wt 126.9 lb

## 2024-02-02 DIAGNOSIS — Z5111 Encounter for antineoplastic chemotherapy: Secondary | ICD-10-CM | POA: Insufficient documentation

## 2024-02-02 DIAGNOSIS — T50995S Adverse effect of other drugs, medicaments and biological substances, sequela: Secondary | ICD-10-CM | POA: Insufficient documentation

## 2024-02-02 DIAGNOSIS — C7931 Secondary malignant neoplasm of brain: Secondary | ICD-10-CM | POA: Insufficient documentation

## 2024-02-02 DIAGNOSIS — C3491 Malignant neoplasm of unspecified part of right bronchus or lung: Secondary | ICD-10-CM

## 2024-02-02 DIAGNOSIS — Z923 Personal history of irradiation: Secondary | ICD-10-CM | POA: Diagnosis not present

## 2024-02-02 DIAGNOSIS — Z5112 Encounter for antineoplastic immunotherapy: Secondary | ICD-10-CM | POA: Insufficient documentation

## 2024-02-02 DIAGNOSIS — C3411 Malignant neoplasm of upper lobe, right bronchus or lung: Secondary | ICD-10-CM | POA: Diagnosis not present

## 2024-02-02 DIAGNOSIS — Z9221 Personal history of antineoplastic chemotherapy: Secondary | ICD-10-CM | POA: Diagnosis not present

## 2024-02-02 DIAGNOSIS — C3490 Malignant neoplasm of unspecified part of unspecified bronchus or lung: Secondary | ICD-10-CM | POA: Diagnosis not present

## 2024-02-02 LAB — CMP (CANCER CENTER ONLY)
ALT: 12 U/L (ref 0–44)
AST: 12 U/L — ABNORMAL LOW (ref 15–41)
Albumin: 3.6 g/dL (ref 3.5–5.0)
Alkaline Phosphatase: 110 U/L (ref 38–126)
Anion gap: 9 (ref 5–15)
BUN: 12 mg/dL (ref 6–20)
CO2: 29 mmol/L (ref 22–32)
Calcium: 9.9 mg/dL (ref 8.9–10.3)
Chloride: 99 mmol/L (ref 98–111)
Creatinine: 0.53 mg/dL — ABNORMAL LOW (ref 0.61–1.24)
GFR, Estimated: >60 mL/min (ref >60)
Glucose, Bld: 149 mg/dL — ABNORMAL HIGH (ref 70–99)
Potassium: 4.5 mmol/L (ref 3.5–5.1)
Sodium: 137 mmol/L (ref 135–145)
Total Bilirubin: 0.2 mg/dL (ref 0.0–1.2)
Total Protein: 6.9 g/dL (ref 6.5–8.1)

## 2024-02-02 LAB — CBC WITH DIFFERENTIAL (CANCER CENTER ONLY)
Abs Immature Granulocytes: 0.04 10*3/uL (ref 0.00–0.07)
Basophils Absolute: 0 10*3/uL (ref 0.0–0.1)
Basophils Relative: 0 %
Eosinophils Absolute: 0 10*3/uL (ref 0.0–0.5)
Eosinophils Relative: 0 %
HCT: 37.6 % — ABNORMAL LOW (ref 39.0–52.0)
Hemoglobin: 12.2 g/dL — ABNORMAL LOW (ref 13.0–17.0)
Immature Granulocytes: 0 %
Lymphocytes Relative: 5 %
Lymphs Abs: 0.5 10*3/uL — ABNORMAL LOW (ref 0.7–4.0)
MCH: 29.3 pg (ref 26.0–34.0)
MCHC: 32.4 g/dL (ref 30.0–36.0)
MCV: 90.4 fL (ref 80.0–100.0)
Monocytes Absolute: 1.9 10*3/uL — ABNORMAL HIGH (ref 0.1–1.0)
Monocytes Relative: 19 %
Neutro Abs: 7.4 10*3/uL (ref 1.7–7.7)
Neutrophils Relative %: 76 %
Platelet Count: 396 10*3/uL (ref 150–400)
RBC: 4.16 MIL/uL — ABNORMAL LOW (ref 4.22–5.81)
RDW: 15.8 % — ABNORMAL HIGH (ref 11.5–15.5)
WBC Count: 9.8 10*3/uL (ref 4.0–10.5)
nRBC: 0 % (ref 0.0–0.2)

## 2024-02-02 MED ORDER — PALONOSETRON HCL INJECTION 0.25 MG/5ML
0.2500 mg | Freq: Once | INTRAVENOUS | Status: AC
  Administered 2024-02-02: 0.25 mg via INTRAVENOUS
  Filled 2024-02-02: qty 5

## 2024-02-02 MED ORDER — SODIUM CHLORIDE 0.9 % IV SOLN
15.0000 mg/kg | Freq: Once | INTRAVENOUS | Status: AC
  Administered 2024-02-02: 900 mg via INTRAVENOUS
  Filled 2024-02-02: qty 4

## 2024-02-02 MED ORDER — FOSAPREPITANT DIMEGLUMINE INJECTION 150 MG
150.0000 mg | Freq: Once | INTRAVENOUS | Status: AC
  Administered 2024-02-02: 150 mg via INTRAVENOUS
  Filled 2024-02-02: qty 150

## 2024-02-02 MED ORDER — CYANOCOBALAMIN 1000 MCG/ML IJ SOLN
1000.0000 ug | Freq: Once | INTRAMUSCULAR | Status: AC
  Administered 2024-02-02: 1000 ug via INTRAMUSCULAR
  Filled 2024-02-02: qty 1

## 2024-02-02 MED ORDER — DEXAMETHASONE SODIUM PHOSPHATE 10 MG/ML IJ SOLN
10.0000 mg | Freq: Once | INTRAMUSCULAR | Status: AC
  Administered 2024-02-02: 10 mg via INTRAVENOUS
  Filled 2024-02-02: qty 1

## 2024-02-02 MED ORDER — SODIUM CHLORIDE 0.9 % IV SOLN: INTRAVENOUS | Status: DC

## 2024-02-02 MED ORDER — SODIUM CHLORIDE 0.9 % IV SOLN
546.0000 mg | Freq: Once | INTRAVENOUS | Status: AC
  Administered 2024-02-02: 550 mg via INTRAVENOUS
  Filled 2024-02-02: qty 55

## 2024-02-02 MED ORDER — SODIUM CHLORIDE 0.9 % IV SOLN
500.0000 mg/m2 | Freq: Once | INTRAVENOUS | Status: AC
  Administered 2024-02-02: 900 mg via INTRAVENOUS
  Filled 2024-02-02: qty 20

## 2024-02-02 NOTE — Patient Instructions (Signed)
 [facility] - A DEPT OF [facility]  Discharge Instructions: Thank you for choosing [facility] to provide your oncology and hematology care.   If you have a lab appointment with the Cancer Center, please go directly to the Cancer Center and check in at the registration area.   Wear comfortable clothing and clothing appropriate for easy access to any Portacath or PICC line.   We strive to give you quality time with your provider. You may need to reschedule your appointment if you arrive late (15 or more minutes).  Arriving late affects you and other patients whose appointments are after yours.  Also, if you miss three or more appointments without notifying the office, you may be dismissed from the clinic at the provider's discretion.      For prescription refill requests, have your pharmacy contact our office and allow 72 hours for refills to be completed.    Today you received the following chemotherapy and/or immunotherapy agents: Bevacizumab , Alimta , Carboplatin       To help prevent nausea and vomiting after your treatment, we encourage you to take your nausea medication as directed.  BELOW ARE SYMPTOMS THAT SHOULD BE REPORTED IMMEDIATELY: *FEVER GREATER THAN 100.4 F (38 C) OR HIGHER *CHILLS OR SWEATING *NAUSEA AND VOMITING THAT IS NOT CONTROLLED WITH YOUR NAUSEA MEDICATION *UNUSUAL SHORTNESS OF BREATH *UNUSUAL BRUISING OR BLEEDING *URINARY PROBLEMS (pain or burning when urinating, or frequent urination) *BOWEL PROBLEMS (unusual diarrhea, constipation, pain near the anus) TENDERNESS IN MOUTH AND THROAT WITH OR WITHOUT PRESENCE OF ULCERS (sore throat, sores in mouth, or a toothache) UNUSUAL RASH, SWELLING OR PAIN  UNUSUAL VAGINAL DISCHARGE OR ITCHING   Items with * indicate a potential emergency and should be followed up as soon as possible or go to the Emergency Department if any problems should occur.  Please show the CHEMOTHERAPY ALERT  CARD or IMMUNOTHERAPY ALERT CARD at check-in to the Emergency Department and triage nurse.  Should you have questions after your visit or need to cancel or reschedule your appointment, please contact [facility] - A DEPT OF [facility]  Dept: [phone]  and follow the prompts.  Office hours are 8:00 a.m. to 4:30 p.m. Monday - Friday. Please note that voicemails left after 4:00 p.m. may not be returned until the following business day.  We are closed weekends and major holidays. You have access to a nurse at all times for urgent questions. Please call the main number to the clinic Dept: [phone] and follow the prompts.   For any non-urgent questions, you may also contact your provider using MyChart. We now offer e-Visits for anyone 24 and older to request care online for non-urgent symptoms. For details visit [email or web address].   Also download the MyChart app! Go to the app store, search MyChart, open the app, select [place], and log in with your MyChart username and password.

## 2024-02-02 NOTE — Progress Notes (Signed)
 [facility] Telephone:([phone]) [phone]   Fax:([phone]) [phone]  OFFICE PROGRESS NOTE  [name], MD [address] [place] [address]  DIAGNOSIS:  Metastatic non-small cell lung cancer initially diagnosed as stage IIIB (T1c, N3, M0) non-small cell lung cancer, adenocarcinoma presented with right upper lobe lung mass in addition to mediastinal and bilateral supraclavicular lymphadenopathy diagnosed in January 2020.  He had evidence of disease recurrence in June 2021 with adenopathy in the subcarinal, level 3 left neck lymph node, and lymph node at the curious of the diaphragm and in the upper abdomen.   PD-L1: 10%   Guardant 360 molecular studies showed no actionable mutation   Foundation One Testing:   Biomarker Findings Microsatellite status - MS-Stable Tumor Mutational Burden - 4 Muts/Mb Genomic Findings For a complete list of the genes assayed, please refer to the Appendix. ALK EML4-ALK fusion (Variant 2) SMARCB1 R377C CTNNB1 S45P CDKN2A/B CDKN2B loss, CDKN2A loss CXCR4 W106* TP53 splice site 672G>A 7 Disease relevant genes with no reportable alterations: BRAF, EGFR, ERBB2, KRAS, MET, RET, ROS1   PRIOR THERAPY: 1) Concurrent chemoradiation with weekly carboplatin  for AUC of 2 and paclitaxel  45 mg/M2.  Status post 6 cycles.  Last dose was given on October 10, 2018 with stable disease. 2) Radiation treatment to the enlarging right cervical lymph nodes under the care of Dr. Shannon. First treatment 03/06/2019. Last treatment scheduled on 04/13/2019 3) Consolidation treatment with immunotherapy with Imfinzi  10 mg/KG every 2 weeks.  First dose November 17, 2018.  Status post 26 cycles. 4) Systemic chemotherapy with carboplatin  for an AUC of 5, Alimta  500 mg/m2, and Keytruda  200 mg IV every 3 weeks. First dose expected on 02/14/20. Status post 1 cycle.  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 5) SBRT to the left lower lobe lung nodule. 6) [name] 600 mg p.o. twice daily.  He started the first dose on March 08, 2020.   Status post 44 months of treatment.  Discontinued in March 2025 due to disease progression 7) [name] 100 mg daily.  Discontinued in April 2025 due to severe intolerance due to mental status changes/confusion  8) Radiation to the bilateral cervical lymph nodes under the care of Dr. Dewey last day expected on 11/17/2023    CURRENT THERAPY: Palliative systemic chemotherapy with carboplatin  for an AUC of 4 (dose reduced), Alimta  500 mg/m (dose reduced), and [name]  IV every 3 weeks.  First dose expected on 12/23/2023.  Status post 2 cycles.  INTERVAL HISTORY: Brian [name] 63 y.o. male returns to the clinic today for follow-up visit accompanied by his wife. Discussed the use of AI scribe software for clinical note transcription with the patient, who gave verbal consent to proceed.  History of Present Illness Brian [name] is a 63 year old male with metastatic non-small cell lung cancer who presents for cycle three of chemotherapy. He is accompanied by his wife.  He has a history of metastatic non-small cell lung cancer, adenocarcinoma, initially diagnosed as stage IIIB in January 2020. The disease recurred and metastasized in June 2021. He was treated with 600 mg of POPID for approximately 44 months, which was discontinued in March 2025 due to disease progression. A short course of lorlatinib  was attempted but was not tolerated due to mental status changes.  Currently, he is undergoing chemotherapy with carboplatin , Alimta , and [name] . The last cycle  of chemotherapy affected his sleep, similar to his initial experiences with chemotherapy years ago, which he attributes to the steroid Decadron  taken with his treatment. He felt 'a couple of steps behind' the day after treatment but had no further issues. No significant side effects  from the chemotherapy, although his blood pressure has been slightly elevated, which he attributes to [name] . He is not currently on any blood pressure medication.  He has noticed an improvement in his condition, including weight gain and better posture, which aids his breathing. He is off oxygen  for longer periods each day and is attempting to go without oxygen  today.    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  MEDICATIONS:  Current Outpatient Medications  Medication Sig Dispense Refill   acetaminophen  (TYLENOL ) 500 MG tablet Take 1,000 mg by mouth every 6 (six) hours as needed (for pain).     albuterol  (VENTOLIN  HFA) 108 (90 Base) MCG/ACT inhaler INHALE 2 PUFFS INTO THE LUNGS EVERY 4 (FOUR) HOURS AS NEEDED FOR WHEEZING OR SHORTNESS OF BREATH. 6.7 each 4   amLODipine  (NORVASC ) 5 MG tablet Take 0.5 tablets (2.5 mg total) by mouth in the morning. 30 tablet 2   atorvastatin  (LIPITOR) 20 MG tablet Take 20 mg by mouth in the morning.     baclofen  (LIORESAL ) 10 MG tablet Take 10 mg by mouth at bedtime.     cyclobenzaprine  (FLEXERIL ) 5 MG tablet TAKE 1 TABLET BY MOUTH THREE TIMES A DAY AS NEEDED FOR MUSCLE SPASMS 30 tablet 0   dexamethasone  (DECADRON ) 4 MG tablet Take 1 tablet twice a day the day before, the day of, and the  day after chemotherapy. 40 tablet 2   DULoxetine  (CYMBALTA ) 60 MG capsule Take 1 capsule (60 mg total) by mouth daily. 90 capsule 3   fluticasone  (FLONASE ) 50 MCG/ACT nasal spray Place 1 spray into both nostrils daily. 1 mL 2   folic acid  (FOLVITE ) 1 MG tablet Take 1 tablet (1 mg total) by mouth daily. 30 tablet 2   levothyroxine  (SYNTHROID ) 100 MCG tablet Take 100 mcg by mouth daily before breakfast.     methylPREDNISolone  (MEDROL  DOSEPAK) 4 MG TBPK tablet Use as instructed 21 tablet 0   MIRALAX  17 GM/SCOOP powder Take 17 g by mouth daily as needed for mild constipation.     mirtazapine  (REMERON ) 15 MG tablet TAKE 1 TABLET BY MOUTH EVERYDAY AT BEDTIME 90 tablet 1   prochlorperazine  (COMPAZINE ) 10 MG tablet Take 1 tablet (10 mg total) by mouth every 6 (six) hours as needed. 30 tablet 2   TRELEGY ELLIPTA  100-62.5-25 MCG/INH AEPB Inhale 1 puff into the lungs in the morning.     venlafaxine  (EFFEXOR ) 37.5 MG tablet Take 37.5 mg by mouth in the morning.  No current facility-administered medications for this visit.    SURGICAL HISTORY:  Past Surgical History:  Procedure Laterality Date   COLONOSCOPY     FEMUR FRACTURE SURGERY Left 1969   INGUINAL HERNIA REPAIR Right 06/24/2020   Procedure: RIGHT OPEN INGUINAL HERNIA REPAIR WITH MESH;  Surgeon: Kinsinger, Herlene Righter, MD;  Location: [facility];  Service: General;  Laterality: Right;   IR IVC FILTER PLMT / S&I PORTER GUID/MOD SED  03/04/2020   IR IVC FILTER RETRIEVAL / S&I PORTER GUID/MOD SED  08/15/2020   IR RADIOLOGIST EVAL & MGMT  08/06/2020   SPINAL CORD STIMULATOR INSERTION N/A 10/05/2023   Procedure: CERVICAL SPINAL CORD STIMULATOR PLACEMENT;  Surgeon: Claudene Penne ORN, MD;  Location: [facility] ORS;  Service: Neurosurgery;  Laterality: N/A;   THORACENTESIS Right 12/10/2023   Procedure: THORACENTESIS;  Surgeon: Kara Dorn NOVAK, MD;  Location: [facility] ENDOSCOPY;  Service: Pulmonary;  Laterality: Right;    REVIEW OF SYSTEMS:  Constitutional: positive for  fatigue Eyes: negative Ears, nose, mouth, throat, and face: negative Respiratory: positive for dyspnea on exertion Cardiovascular: negative Gastrointestinal: negative Genitourinary:negative Integument/breast: negative Hematologic/lymphatic: negative Musculoskeletal:positive for arthralgias Neurological: negative Behavioral/Psych: positive for sleep disturbance Endocrine: negative Allergic/Immunologic: negative   PHYSICAL EXAMINATION: General appearance: alert, cooperative, fatigued, and no distress Head: Normocephalic, without obvious abnormality, atraumatic Neck: no adenopathy, no JVD, supple, symmetrical, trachea midline, and thyroid  not enlarged, symmetric, no tenderness/mass/nodules Lymph nodes: Cervical, supraclavicular, and axillary nodes normal. Resp: clear to auscultation bilaterally Back: symmetric, no curvature. ROM normal. No CVA tenderness. Cardio: regular rate and rhythm, S1, S2 normal, no murmur, click, rub or gallop GI: soft, non-tender; bowel sounds normal; no masses,  no organomegaly Extremities: extremities normal, atraumatic, no cyanosis or edema Neurologic: Alert and oriented X 3, normal strength and tone. Normal symmetric reflexes. Normal coordination and gait  ECOG PERFORMANCE STATUS: 1 - Symptomatic but completely ambulatory  Blood pressure (!) 145/94, pulse (!) 110, temperature 99.2 F (37.3 C), temperature source Temporal, resp. rate 16, height 5' 8 (1.727 m), weight 126 lb 14.4 oz (57.6 kg), SpO2 97%.  LABORATORY DATA: Lab Results  Component Value Date   WBC 9.8 02/02/2024   HGB 12.2 (L) 02/02/2024   HCT 37.6 (L) 02/02/2024   MCV 90.4 02/02/2024   PLT 396 02/02/2024      Chemistry      Component Value Date/Time   NA 137 02/02/2024 0928   K 4.5 02/02/2024 0928   CL 99 02/02/2024 0928   CO2 29 02/02/2024 0928   BUN 12 02/02/2024 0928   CREATININE 0.53 (L) 02/02/2024 0928      Component Value Date/Time   CALCIUM  9.9 02/02/2024 0928    ALKPHOS 110 02/02/2024 0928   AST 12 (L) 02/02/2024 0928   ALT 12 02/02/2024 0928   BILITOT 0.2 02/02/2024 0928       RADIOGRAPHIC STUDIES: DG Chest 2 View Result Date: 01/05/2024 CLINICAL DATA:  Increased shortness of breath EXAM: CHEST - 2 VIEW COMPARISON:  Dec 10, 2023 FINDINGS: Chronic right mid lung atelectatic changes, chronic right lower lobe atelectasis with a chronic right pleural reaction no significant effusion. No acute infiltrates or consolidations Ventriculoperitoneal shunt catheter projects over the left hemithorax Heart normal size IMPRESSION: Chronic right lung changes as described without evidence of acute cardiopulmonary infiltrates Electronically Signed   By: Franky Chard M.D.   On: 01/05/2024 14:54    ASSESSMENT AND PLAN: This is a very pleasant 63 years old white male with metastatic non-small cell lung  cancer, adenocarcinoma with positive ALK gene translocation that was initially diagnosed as a stage IIIb in January 2020 with evidence for disease recurrence and metastasis in June 2021. He was initially treated with a course of concurrent chemoradiation followed by consolidation treatment with immunotherapy and then systemic chemotherapy with 1 cycle of carboplatin , Alimta  and Keytruda  every 4 his tumor was found to have ALK gene translocation. At that time he was switched to treatment with alectinib since August 2021 status post 44 months of treatment discontinued in March 2025 secondary to disease progression. The patient started treatment with [name]  100 mg p.o. daily but he was unable to take it more than few weeks and this discontinued secondary to significant toxicity with mental status change and confusion. Is here today for evaluation and discussion of other treatment options.  He is currently on treatment with systemic chemotherapy with carboplatin  for AUC of 4 and Alimta  400 Mg/M2 in addition to [name]  15 Mg/KG every 3 weeks.  Status post 2 cycles. He has been  tolerating the treatment fairly well. Assessment and Plan Assessment & Plan Metastatic lung cancer, adenocarcinoma Metastatic lung adenocarcinoma initially diagnosed as stage IIIB in January 2020 with recurrence and metastasis in June 2021. Currently undergoing chemotherapy with carboplatin , Alimta , and [name] . Reports improved breathing and increased periods off oxygen . No significant side effects from current chemotherapy regimen. Blood pressure slightly elevated, likely secondary to [name] . Currently not on antihypertensive medication. - Administer third cycle of chemotherapy with carboplatin , Alimta , and [name]  - Order CT scan of neck, chest, abdomen, and pelvis in two weeks to assess treatment response - Continue chemotherapy for at least three more cycles if treatment is effective and well-tolerated - Consider discontinuing carboplatin  after six cycles and continuing with Alimta  and [name]  - Monitor blood pressure at home - Consult with family doctor for antihypertensive medication if blood pressure remains elevated He was advised to call immediately if he has any concerning symptoms in the interval.  The patient voices understanding of current disease status and treatment options and is in agreement with the current care plan.  All questions were answered. The patient knows to call the clinic with any problems, questions or concerns. We can certainly see the patient much sooner if necessary.  The total time spent in the appointment was 30 minutes including review of chart and various tests results, discussions about plan of care and coordination of care plan .   Disclaimer: This note was dictated with voice recognition software. Similar sounding words can inadvertently be transcribed and may not be corrected upon review.

## 2024-02-07 NOTE — Progress Notes (Signed)
 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  HPI: Brian [name] is a 63 y.o. year old male with a history of diabetes, hypothyroidism, lung cancer s/p chemo and radiation now with mets in neck, emphysema, SAH, and HLD who we last saw on 10/21/23.  To briefly review: 03/17/23: Patient has pain starting at the base of the neck that radiates mostly into the right arm. He has no use in his 4th and 5th digits. He has a lot of pain in the neck, shoulder, into the elbow, and into the arm. The pain is very severe and worse at night. In the left arm patient has some numbness and pain, most in the forearm. He feels like it is a bad sunburn. He has muscle atrophy and is getting progressively worse. Patient thinks symptoms started in 06/2022 and were mild. Symptoms became severe in 09/2022. The pain is so bad currently that he sleeps only a couple hours per night.   He denies similar symptoms in the past.   He endorses muscle cramps in right triceps mainly. He endorses some fasciculations in his arms. He denies significant low back pain or symptoms in his legs.   The patient denies symptoms suggestive of oculobulbar weakness including diplopia, ptosis, dysphagia, poor saliva control, dysarthria/dysphonia, impaired mastication, facial weakness/droop.   There are no neuromuscular respiratory weakness symptoms, particularly orthopnea>dyspnea.    Pseudobulbar affect is absent.   He does not report any constitutional symptoms like fever, night sweats, anorexia or unintentional weight loss. He does report 10 lbs of weight loss but this is intentional.   EtOH use: 2-3 beers a month  Restrictive diet? No Family history of neuropathy/myopathy/neurologic disease? No   Patient has seen spine and maybe saw neurology in [facility]. He has also been seen at the hand center. He saw PM&R at [facility]  and had EMG on 02/12/23 and 02/17/23 that showed right brachial plexopathy (per notes: upper trunk brachial pan plexopathy in the right upper extremity predominantly in the lower trunk distribution). Patient states that he was told he had carpal tunnel and ulnar neuropathy, which is what the LUE report mentioned.   Patient has been prescribed gabapentin  1200 mg at night (sometimes will take 1800 mg). He is not sure it is working. He denies any side effects. He also has baclofen  10 mg TID, but does not really take this. His pain is so bad though, he does not have good relief. He also uses ice and heat. He also had a steroid shot and Toradol  that only helped about 2 hours.   Oncology history per Dr. Jeannett oncology clinic note (12/07/22):   Metastatic non-small cell lung cancer initially diagnosed as stage IIIB (T1c, N3, M0) non-small cell lung cancer, adenocarcinoma presented with right upper lobe lung mass in addition to mediastinal and bilateral supraclavicular lymphadenopathy diagnosed in January 2020.  He had evidence of disease recurrence in June 2021 with adenopathy in the subcarinal, level 3 left neck lymph node, and lymph node at the curious of the diaphragm and in the upper abdomen.    PRIOR THERAPY: 1) Concurrent chemoradiation with weekly carboplatin  for AUC of 2 and paclitaxel  45 mg/M2.  Status post 6 cycles.  Last dose was given on October 10, 2018 with stable disease. 2) Radiation treatment to the enlarging right cervical lymph nodes under the care of Dr. Shannon. First treatment  03/06/2019. Last treatment scheduled on 04/13/2019 3) Consolidation treatment with immunotherapy with Imfinzi  10 mg/KG every 2 weeks.  First dose November 17, 2018.  Status post 26 cycles. 4) Systemic chemotherapy with carboplatin  for an AUC of 5, Alimta  500 mg/m2, and Keytruda  200 mg IV every 3 weeks. First dose expected on 02/14/20. Status post 1 cycle.  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 5) SBRT to the left lower lobe lung nodule.   CURRENT THERAPY:  [name] 600 mg p.o. twice daily.  He started the first dose on March 08, 2020.   Status post 33 months of treatment.   Per patient, he is currently in remission.   04/21/23: EMG on 03/30/23 did show evidence of bilateral brachial plexopathy with myokymia as would be seen in radiation plexopathy. Patient continued to have significant pain, especially at night. I increased his gabapentin  to 600 mg midday and 2400 mg in the evening while continuing Cymbalta  60 mg daily (03/30/23).   Patient's pain has progressively gotten worse. He has a lot of swelling in his hand and feels like it is going to burst. He tried to elevated it last night. The swelling reduced when he did this. He thinks movement and use of arm are still bad but are unchanged. Patient is no longer taking the gabapentin  600 mg during the day, as it did not do much. He continues to take gabapentin  2400 mg in the evening and Cymbalta  60 mg daily.    MRI of brachial plexus on 04/02/23 which was normal of bilateral brachial plexus. Mild teninosis of right supraspinatus tendon was mentioned.   Patient saw Dr. Claudene in NSGY on 04/07/23. Patient was referred for scalene block and evaluation of peripheral nerve stimulator. He will be evaluated on 05/04/23.   Patient has started therapy (last week). He has this scheduled through 07/2023.   06/17/23: Patient saw Dr. Marcelino in pain management on 05/04/23. In addition to radiation plexopathy, Dr. Marcelino also diagnosed complex regional pain syndrome. He recommended decreasing gabapentin  to 1200 mg nightly and starting amitriptyline  50 mg at bedtime. He also continued Cymbalta , warning of signs of serotonin syndrome. A diagnostic scalene block was done on 05/12/23. This helped with pain for a little bit. He followed up again on 06/09/23. The plan is to do a right stellate  ganglion nerve block later this month. He is sleeping much better, now 6-7 hours at night.   He has lost function in right arm at times, but at other times feels it has increased range of motion. Overall, he thinks his arm is weaker.   He has tingling in his left arm (4th and 5th digits), but overall feels this arm is similar to prior.   He has no new complaints today.  10/21/23: PT messaged me regarding patient continuing to get weaker and now having voice changes. There was concern for progression of radiation plexopathy but also radiation damage to pharyngeal nerves.    Patient followed up with oncology, last on 10/13/23. Per that note by Dr. Sherrod: The patient had CT scan of the neck performed recently that showed evidence of metastatic disease in the neck including multiple pathologic left-sided cervical lymph nodes as well as a 4 cm region of ill-defined masslike soft tissue extending from right level 4 to the upper chest with encasement of the right subclavian artery and likely brachial plexus involvement. The latter is likely  the etiology of the right focal cord paresis.    Patient is meeting with oncology next week to discuss treatment options.   His pain has improved significantly with the spinal cord stimulator. He finds he has neck pain likely due to weakness of the muscles. He is still on amitriptyline  75 mg daily and Cymbalta  60 mg daily. He stopped gabapentin  and is not using oxycodone .   Patient has had a couple of falls related to lightheadedness. He denies any leg weakness.  Most recent Assessment and Plan (10/21/23): This is Brian [name], a 63 y.o. male with bilateral arm pain, weakness, and atrophy, right >>> left - EMG was consistent with a diffuse right brachial plexopathy with myokymic discharges as can be seen in radiation plexopathy (seen in right FPL, biceps, and infraspinatus). His LUE showed abnormal median, ulnar, and radial sensory responses with chronic  neurogenic changes seen in the left FDI, FPL (with active changes), and APB, suggesting a possible lower trunk predominant process vs C8 and T1 radiculopathy. Patient continued to have progressive weakness of the RUE which now has no movement proximally or distally. LUE symptoms are mostly limited to hand, but profound. His pain is much improved with spinal cord stimulator.    Unfortunately, patient's weakness not only progressed, but he also developed hoarseness of his voice. 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 His has an up coming appointment with oncology to discuss his options.   Plan: -Continue pain medications as per Dr. Marcelino, but symptoms currently well controlled with cymbalta  60 mg and amitriptyline  75 mg daily and spinal cord stimulator (placed by Dr. Claudene) -Follow up with oncology as planned -Patient would like to continue working as this brings him enjoyment. I provided letter for patient today allowing him to return to work, no more than 4-6 hours in office and able to work from home as he requested.  Since their last visit: Patient has lost over 60 lbs. His voice is very weak, allowing little more than a whisper. He has lost all function of right arm and has little strength in left arm. He is undergoing chemotherapy. He thinks he is tolerating it okay.  He will get sharp nerve pains in his arms, most nights. He takes gabapentin  and it helps. He still has the spinal cord stimulator that seems to working well.  He is no longer working.   MEDICATIONS:  Outpatient Encounter Medications as of 02/17/2024  Medication Sig   acetaminophen  (TYLENOL ) 500 MG tablet Take 1,000 mg by mouth every 6 (six) hours as needed (for pain).   albuterol  (VENTOLIN  HFA) 108 (90 Base) MCG/ACT inhaler INHALE 2 PUFFS INTO THE LUNGS EVERY 4 (FOUR) HOURS AS NEEDED FOR WHEEZING OR SHORTNESS OF BREATH.   amLODipine  (NORVASC ) 5 MG tablet Take 0.5  tablets (2.5 mg total) by mouth in the morning.   atorvastatin  (LIPITOR) 20 MG tablet Take 20 mg by mouth in the morning.   baclofen  (LIORESAL ) 10 MG tablet Take 10 mg by mouth at bedtime.   dexamethasone  (DECADRON ) 4 MG tablet Take 1 tablet twice a day the day before, the day of, and the day after chemotherapy.   folic acid  (FOLVITE ) 1 MG tablet Take 1 tablet (1 mg total) by mouth daily.   levothyroxine  (SYNTHROID ) 100 MCG tablet Take 100 mcg by mouth daily before breakfast.   MIRALAX  17 GM/SCOOP powder Take 17 g by mouth daily as needed for mild constipation.  mirtazapine  (REMERON ) 15 MG tablet TAKE 1 TABLET BY MOUTH EVERYDAY AT BEDTIME   prochlorperazine  (COMPAZINE ) 10 MG tablet Take 1 tablet (10 mg total) by mouth every 6 (six) hours as needed.   TRELEGY ELLIPTA  100-62.5-25 MCG/INH AEPB Inhale 1 puff into the lungs in the morning.   venlafaxine  (EFFEXOR ) 37.5 MG tablet Take 37.5 mg by mouth in the morning.   cyclobenzaprine  (FLEXERIL ) 5 MG tablet TAKE 1 TABLET BY MOUTH THREE TIMES A DAY AS NEEDED FOR MUSCLE SPASMS (Patient not taking: Reported on 02/17/2024)   DULoxetine  (CYMBALTA ) 60 MG capsule Take 1 capsule (60 mg total) by mouth daily.   fluticasone  (FLONASE ) 50 MCG/ACT nasal spray Place 1 spray into both nostrils daily. (Patient not taking: Reported on 02/17/2024)   methylPREDNISolone  (MEDROL  DOSEPAK) 4 MG TBPK tablet Use as instructed (Patient not taking: Reported on 02/17/2024)   [DISCONTINUED] DULoxetine  (CYMBALTA ) 60 MG capsule Take 1 capsule (60 mg total) by mouth daily. (Patient not taking: Reported on 02/17/2024)   No facility-administered encounter medications on file as of 02/17/2024.    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    Non-small cell cancer of right lung (HCC) 08/23/2018   Pneumonia    Pre-diabetes    Radiation-induced brachial plexopathy    Recurrent right pleural effusion    Secondary malignant neoplasm of brain and spinal cord (HCC)    Subarachnoid hemorrhage (HCC) 2016   Voice hoarseness     PAST SURGICAL HISTORY: Past Surgical History:  Procedure Laterality Date   COLONOSCOPY     FEMUR FRACTURE SURGERY Left 1969   INGUINAL HERNIA REPAIR Right 06/24/2020   Procedure: RIGHT OPEN INGUINAL HERNIA REPAIR WITH MESH;  Surgeon: Kinsinger, Herlene Righter, MD;  Location: [facility] ORS;  Service: General;  Laterality: Right;   IR IVC FILTER PLMT / S&I PORTER GUID/MOD SED  03/04/2020   IR IVC FILTER RETRIEVAL / S&I PORTER GUID/MOD SED  08/15/2020   IR RADIOLOGIST EVAL & MGMT  08/06/2020   SPINAL CORD STIMULATOR INSERTION N/A 10/05/2023   Procedure: CERVICAL SPINAL CORD STIMULATOR PLACEMENT;  Surgeon: Claudene Penne ORN, MD;  Location: [facility] ORS;  Service: Neurosurgery;  Laterality: N/A;   THORACENTESIS Right 12/10/2023   Procedure: THORACENTESIS;  Surgeon: Kara Dorn NOVAK, MD;  Location: [facility] ENDOSCOPY;  Service: Pulmonary;  Laterality: Right;    ALLERGIES: Allergies  Allergen Reactions   Lorazepam  Anxiety    Per MD, patient gets restless/delirious after Ativan  administration.    FAMILY HISTORY: Family History  Problem Relation Age of Onset   Diabetes Mother    Diabetes Brother    Colon cancer Neg Hx    Stomach cancer Neg Hx    Thyroid  cancer Neg Hx     SOCIAL HISTORY: Social History   Tobacco Use   Smoking status: Former    Types: Cigarettes   Smokeless tobacco: Never  Vaping Use   Vaping status: Never Used  Substance Use Topics   Alcohol use: Yes    Alcohol/week: 5.0 standard drinks of alcohol    Types: 5 Standard drinks or equivalent per week    Comment: occasionally  Drug use: No   Social  History   Social History Narrative   Are you right handed or left handed? Right   Are you currently employed ?    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   Who lives with you? wife   What type of home do you live in: 1 story or 2 story? Two    Caffeine  1 cup every couple weeks    Objective:  Vital Signs:  BP 117/81   Pulse (!) 111   Ht 5' 8 (1.727 m)   Wt 125 lb (56.7 kg)   SpO2 98%   BMI 19.01 kg/m   General: General appearance: Awake and alert. No distress. Cooperative with exam. Very thin. Skin: No obvious rash or jaundice. HEENT: Atraumatic. Anicteric. Lungs: Non-labored breathing on room air   Neurological: Mental Status: Alert. Speech fluent. No pseudobulbar affect Cranial Nerves: CNII: No RAPD. Visual fields intact. CNIII, IV, VI: PERRL. No nystagmus. EOMI. CN V: Facial sensation intact bilaterally to fine touch. CN VII: Facial muscles symmetric and strong. No ptosis at rest. CN VIII: Hears finger rub well bilaterally. CN IX: No hypophonia. CN X: Palate elevates symmetrically. CN XI: Full strength shoulder shrug bilaterally. CN XII: Tongue protrusion full and midline. No atrophy or fasciculations. Hypophonia, only able to whisper Motor: Tone is normal in bilateral lower extremities and LUE. Flaccid in RUE. Significant bilateral upper extremity and lower extremity atrophy.  Individual muscle group testing (MRC grade out of 5):  Movement     Neck flexion 5-    Neck extension 5-     Right Left   Shoulder abduction 0 5   Shoulder adduction 0 5   Shoulder ext rotation 0 5   Shoulder int rotation 0 5   Elbow flexion 0 5   Elbow extension 0 3   Finger abduction - FDI 0 0   Finger abduction - ADM 0 0   Finger extension 0 0   Finger distal flexion - 2/3 0 0   Finger distal flexion - 4/5 0 0   Thumb flexion - FPL 0 0   Thumb abduction - APB 0 0    Hip flexion 5 5   Hip extension 5 5   Hip adduction 5 5   Hip abduction 5  5   Knee extension 5 5   Knee flexion 5 5   Dorsiflexion 5 5   Plantarflexion 5 5     Reflexes:  Right Left  Bicep 0 2-3+  Tricep 0 2-3+  BrRad 0 2-3+  Knee 2+ 2+  Ankle 2+ 2+   Sensation: Vibration: Intact in bilateral lower extremities Temperature: Absent in RUE otherwise intact Gait: Able to rise from chair with arms crossed unassisted. Normal, narrow-based gait.   Lab and Test Review: New results: 02/02/24: CBC w/ diff significant for Hb 12.2 CMP significant for glucose 149  CT head wo contrast (11/16/23): IMPRESSION: Normal head CT. Upper cervical neurostimulator in place.  PET (10/22/23): IMPRESSION: 1. Bilateral hypermetabolic lymph nodes in the lower neck/upper thoracic inlet consistent with nodal metastasis. 2. Hypermetabolic lymph node inferior to the carina consistent with nodal metastasis. 3. No lung nodules. 4. Large layering RIGHT pleural effusion.  Previously reviewed results: 10/13/23: CBC w/ diff significant for WBC 15.0 (neutrophilic predominance), Hb 11.7 CMP significant for glucose 151, alk phos 304   B6 (03/17/23) wnl   TSH (11/06/22): elevated to 6.447   External labs:  01/27/23: CMP significant for mildly elevated glucose (112), Tbili (1.1), and Alk phos (124) TSH wnl HbA1c: 5.8 Lipid panel: total cholesterol 151, TG 75, LDL 62   B12 (10/20/21): 921   Imaging: External EMG (02/12/23 - cannot see table only this note): Griffyn Kucinski is a 63 y.o. year old male with BUE pain and paresthesias (R > L) referred for electrodiagnostics to evaluate for peripheral nerve entrapment versus brachial plexopathy versus cervical radiculopathy. Patient does have a history of lung cancer s/p chemotherapy and radiation to the right upper lung field and right neck region. He also has a history of T2DM. Due to the severity of the findings, testing was limited to the right upper extremity today. He will come back next week for left upper extremity testing.  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 There is evidence of acute denervation in all right upper extremity muscles tested. This is consistent with the patient's clinical history of radiation. Due to the above plexopathy and diffuse findings in the right upper extremity on EMG, cannot rule out a more diffuse process such as motor neuron disease. Will bring patient back in to test for the left upper extremity next week. While the median nerve conduction studies were abnormal, this may be attributed to the more proximal plexopathy, but cannot definitively rule out a concomitant median neuropathy.    External EMG (02/17/23 - cannot see table only this note): Sherri Levenhagen is a 63 y.o. year old male referred for electrodiagnostics, findings consistent with: Acute on chronic left C8 radiculopathy. Severe left-sided carpal tunnel syndrome. Left-sided cubital tunnel syndrome.    MRI brain w/wo contrast (10/15/22): FINDINGS: Brain:   No age advanced or lobar predominant parenchymal atrophy.   Multifocal T2 FLAIR hyperintense signal abnormality within the cerebral white matter, nonspecific but compatible with mild chronic small vessel ischemic disease.   Small chronic infarct within the right cerebellar hemisphere   There is no acute infarct.   No evidence of an intracranial mass.   No chronic intracranial blood products.   No extra-axial fluid collection.   No midline shift.   No pathologic intracranial enhancement identified.   Vascular: Maintained flow voids within the proximal large arterial vessels.   Skull and upper cervical spine: No focal suspicious marrow lesion.   Sinuses/Orbits: No mass or acute finding within the imaged orbits. Partial T2 hyperintense opacification of the right frontal sinus and of an anterior right  ethmoid air cell. Mild mucosal thickening scattered elsewhere within bilateral ethmoid air cells. Mild mucosal thickening within the bilateral maxillary sinuses.   IMPRESSION: 1. No evidence of intracranial metastatic disease. 2. Mild chronic small vessel ischemic changes within the cerebral white matter, similar to the prior brain MRI of 10/09/2021. 3. 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 4. Paranasal sinus disease, as described   MRI right brachial plexus (07/30/22): FINDINGS: Spinal cord   Visualized spinal cord demonstrates normal signal.   Brachial plexus:   Appears normal. No mass impinging on the brachial plexus is identified. No neurogenic tumor is identified.   Muscles and tendons   Normal signal throughout without evidence of denervation atrophy.   Bones   No fracture or worrisome lesion is seen. Small degenerative cyst in the right humeral head is noted.   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 The  glenohumeral joint appears normal.   Other findings   Moderately large right pleural effusion appears increased in size compared to the prior chest CT.   IMPRESSION: Normal appearing right brachial plexus.   Moderately large right pleural effusion appears increased in size compared to the patient's 03/30/2022 chest CT.   Acromioclavicular osteoarthritis.   MRI cervical spine wo contrast (07/30/22): FINDINGS: Alignment: Physiologic.   Vertebrae: No fracture, evidence of discitis, or bone lesion.   Cord: Normal signal and morphology.   Posterior Fossa, vertebral arteries, paraspinal tissues: Negative.   Disc levels:   C2-C3: No significant disc bulge. Mild facet arthropathy. No spinal canal stenosis or neural foraminal narrowing.   C3-C4: No significant disc bulge. Left-greater-than-right facet arthropathy. No spinal canal stenosis or neural foraminal  narrowing.   C4-C5: No significant disc bulge. Left-greater-than-right facet arthropathy. No spinal canal stenosis or neural foraminal narrowing.   C5-C6: Minimal disc bulge with right paracentral and subarticular disc protrusion, which indents the thecal sac but does not deform the spinal cord. Facet and uncovertebral hypertrophy. No spinal canal stenosis. Mild right neural foraminal narrowing.   C6-C7: No significant disc bulge. No spinal canal stenosis or neuroforaminal narrowing.   C7-T1: No significant disc bulge. No spinal canal stenosis or neuroforaminal narrowing.   IMPRESSION: 1. C5-C6 mild right neural foraminal narrowing. 2. Multilevel facet arthropathy, which can be a cause of pain.   EMG (03/30/23): NCV & EMG Findings: Extensive electrodiagnostic evaluation of bilateral upper limbs shows: Bilateral median antebrachial cutaneous (MAC) and right lateral antebrachial cutaneous (LAC) sensory responses are absent. Bilateral median, bilateral ulnar, and bilateral radial sensory responses show reduced amplitudes (see table below). Left LAC is present. Bilateral median (APB) motor responses show reduced amplitudes (right 1.17, left 4.0 mV). Right ulnar (ADM) motor response shows reduced amplitude (4.0 mV). Left ulnar (ADM) motor response is within normal limits, but borderline normal (7.3 mV). Chronic motor axon loss changes WITH accompanying active denervation changes are seen in the bilateral flexor pollicis longus, right first dorsal interosseous, right extensor inidicis proprius, abductor pollicis brevis, and triceps muscles. Chronic motor axon loss changes WITHOUT active denervation changes are seen in right biceps, right deltoid, right infraspinatus, left first dorsal interosseous, left extensor indicis proprius, and left abductor pollicis brevis muscles.  Myokymic discharges are seen in the right flexor pollicis longus, right biceps, and right infraspinatus muscles.    Impression: This is an abnormal study. 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 There are myokymic discharges in 3 muscles of the right upper limb, as may be seen in radiation plexopathy. 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 An overlapping left C8-T1 radiculopathy cannot be completely excluded, though normal cervical paraspinal evaluation makes #2 more likely.   MRI brachial plexus w/wo contrast (04/02/23): FINDINGS: Spinal cord   Normal caliber and signal of visualized spinal cord.   Brachial plexus   Roots: Normal.   Trunks: Normal.   Divisions: Normal.   Cords: Normal.   Branches: Normal.   Muscles and tendons   Muscles are normal. No muscle atrophy or muscle edema. Mild tendinosis of the right supraspinatus tendon. Remainder the rotator cuff tendons bilaterally are intact.   Bones   Cervical spine: Mild broad-based disc bulge at C5-6.   C7 transverse processes: Normal.   Marrow signal: Normal.   Other: Mild osteoarthritis of bilateral  glenohumeral joints.   Other findings   Moderate right pleural effusion.   IMPRESSION: 1. Normal MRI of the brachial plexus bilaterally. 2. Mild tendinosis of the right supraspinatus tendon. 3. Moderate right pleural effusion.  CT soft tissue neck w/ contrast (09/27/23): IMPRESSION: 1. Evidence of metastatic disease in the neck including multiple pathologic left-sided cervical lymph nodes as well as a 4 cm region of ill-defined masslike soft tissue extending from right level IV into the upper chest with encasement of the right subclavian artery and likely brachial plexus involvement. 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 2. Partially visualized right pleural effusion.  ASSESSMENT: This is Brian [name],  a 63 y.o. male with bilateral arm atrophy and weakness, right >>> left - EMG was consistent with a diffuse right brachial plexopathy with myokymic discharges as can be seen in radiation plexopathy (seen in right FPL, biceps, and infraspinatus). His LUE showed abnormal median, ulnar, and radial sensory responses with chronic neurogenic changes seen in the left FDI, FPL (with active changes), and APB, suggesting a possible lower trunk predominant process vs C8 and T1 radiculopathy. Patient continued to have progressive weakness of the RUE which now has no movement proximally or distally. LUE symptoms are mostly limited to hand, but profound. His pain is much improved with spinal cord stimulator.    Unfortunately, patient's weakness not only progressed, but he also developed hoarseness of his voice and dysphagia. 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 He is getting chemotherapy through oncology. He is also losing a lot of weight perhaps due to cancer and also dysphagia. This is contributing to his muscle atrophy.  As we discussed today, patient is unlikely to regain any function in the RUE or function in the left hand. I am concerned about the weight loss and potential for leg weakness, but currently this is okay. I am also monitoring for chemotherapy side effects such as neuropathy.  Plan: -Continue Cymbalta  60 mg daily -Continue gabapentin  600 mg daily -Follow up with oncology as planned  Return to clinic in 3 months  Total time spent reviewing records, interview, history/exam, documentation, and coordination of care on day of encounter:  40 min  Venetia Potters, MD

## 2024-02-09 ENCOUNTER — Inpatient Hospital Stay

## 2024-02-09 DIAGNOSIS — C3411 Malignant neoplasm of upper lobe, right bronchus or lung: Secondary | ICD-10-CM | POA: Diagnosis not present

## 2024-02-09 DIAGNOSIS — Z923 Personal history of irradiation: Secondary | ICD-10-CM | POA: Diagnosis not present

## 2024-02-09 DIAGNOSIS — Z9221 Personal history of antineoplastic chemotherapy: Secondary | ICD-10-CM | POA: Diagnosis not present

## 2024-02-09 DIAGNOSIS — Z5111 Encounter for antineoplastic chemotherapy: Secondary | ICD-10-CM | POA: Diagnosis not present

## 2024-02-09 DIAGNOSIS — Z5112 Encounter for antineoplastic immunotherapy: Secondary | ICD-10-CM | POA: Diagnosis not present

## 2024-02-09 DIAGNOSIS — C3491 Malignant neoplasm of unspecified part of right bronchus or lung: Secondary | ICD-10-CM

## 2024-02-09 DIAGNOSIS — C7931 Secondary malignant neoplasm of brain: Secondary | ICD-10-CM | POA: Diagnosis not present

## 2024-02-09 DIAGNOSIS — T50995S Adverse effect of other drugs, medicaments and biological substances, sequela: Secondary | ICD-10-CM | POA: Diagnosis not present

## 2024-02-09 LAB — CBC WITH DIFFERENTIAL (CANCER CENTER ONLY)
Abs Immature Granulocytes: 0.02 K/uL (ref 0.00–0.07)
Basophils Absolute: 0 K/uL (ref 0.0–0.1)
Basophils Relative: 1 %
Eosinophils Absolute: 0 K/uL (ref 0.0–0.5)
Eosinophils Relative: 2 %
HCT: 35.8 % — ABNORMAL LOW (ref 39.0–52.0)
Hemoglobin: 11.5 g/dL — ABNORMAL LOW (ref 13.0–17.0)
Immature Granulocytes: 1 %
Lymphocytes Relative: 17 %
Lymphs Abs: 0.4 K/uL — ABNORMAL LOW (ref 0.7–4.0)
MCH: 28.9 pg (ref 26.0–34.0)
MCHC: 32.1 g/dL (ref 30.0–36.0)
MCV: 89.9 fL (ref 80.0–100.0)
Monocytes Absolute: 0.6 K/uL (ref 0.1–1.0)
Monocytes Relative: 21 %
Neutro Abs: 1.6 K/uL — ABNORMAL LOW (ref 1.7–7.7)
Neutrophils Relative %: 58 %
Platelet Count: 107 K/uL — ABNORMAL LOW (ref 150–400)
RBC: 3.98 MIL/uL — ABNORMAL LOW (ref 4.22–5.81)
RDW: 15.6 % — ABNORMAL HIGH (ref 11.5–15.5)
WBC Count: 2.7 K/uL — ABNORMAL LOW (ref 4.0–10.5)
nRBC: 0 % (ref 0.0–0.2)

## 2024-02-09 LAB — CMP (CANCER CENTER ONLY)
ALT: 17 U/L (ref 0–44)
AST: 15 U/L (ref 15–41)
Albumin: 3.2 g/dL — ABNORMAL LOW (ref 3.5–5.0)
Alkaline Phosphatase: 101 U/L (ref 38–126)
Anion gap: 5 (ref 5–15)
BUN: 12 mg/dL (ref 6–20)
CO2: 32 mmol/L (ref 22–32)
Calcium: 8.7 mg/dL — ABNORMAL LOW (ref 8.9–10.3)
Chloride: 100 mmol/L (ref 98–111)
Creatinine: 0.4 mg/dL — ABNORMAL LOW (ref 0.61–1.24)
GFR, Estimated: >60 mL/min (ref >60)
Glucose, Bld: 121 mg/dL — ABNORMAL HIGH (ref 70–99)
Potassium: 4 mmol/L (ref 3.5–5.1)
Sodium: 137 mmol/L (ref 135–145)
Total Bilirubin: 0.2 mg/dL (ref 0.0–1.2)
Total Protein: 5.9 g/dL — ABNORMAL LOW (ref 6.5–8.1)

## 2024-02-09 LAB — TOTAL PROTEIN, URINE DIPSTICK: Protein, ur: NEGATIVE mg/dL

## 2024-02-10 ENCOUNTER — Encounter: Payer: Self-pay | Admitting: Pulmonary Disease

## 2024-02-10 ENCOUNTER — Ambulatory Visit: Admitting: Pulmonary Disease

## 2024-02-10 VITALS — BP 129/85 | HR 104 | Ht 68.0 in | Wt 128.0 lb

## 2024-02-10 DIAGNOSIS — J910 Malignant pleural effusion: Secondary | ICD-10-CM | POA: Diagnosis not present

## 2024-02-10 DIAGNOSIS — Z87891 Personal history of nicotine dependence: Secondary | ICD-10-CM | POA: Diagnosis not present

## 2024-02-10 DIAGNOSIS — C3491 Malignant neoplasm of unspecified part of right bronchus or lung: Secondary | ICD-10-CM | POA: Diagnosis not present

## 2024-02-10 NOTE — Patient Instructions (Addendum)
 I am glad you are doing well  Continue trelegy ellipta  1 puff daily and as needed albuterol  - rinse mouth out after using trelegy  Follow up in 6 months, call sooner if shortness of breath is getting worse - we will check a chest x-ray to monitor the fluid around the right lung

## 2024-02-10 NOTE — Progress Notes (Signed)
 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   Initial OV 12/02/23 He was recently admitted 4/15 to 4/21 for aletered mental status thought secondary to [name]  and pneumonia with acute hypoxemic respiratory failure. He was discharged on supplemental oxygen .   He had US  guided thoracentesis 11/22/23 ( ) and 11/17/23 ( ). No pleural fluid studies sent.  Recently seen in oncology clinic 11/24/23, note reviewed.  He reports fatigue/malaise and poor appetite. He has shortness of breath and cough. No hemoptysis. Denies fevers/chills.   OV 02/10/24 Patient seen by oncology 7/2, note reviewed. He is currently receiving chemotherapy with carboplatin , Alimta  and avastin .   He has been doing well since last visit. He reports he is not needing oxygen  anymore and his breathing has been feeling fine.   CXR a month ago shows minimal fluid at right base.   He continues on trelegy 1 puff daily. He reports he is eating well but needs to take his time due to some dysphagia.  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    Non-small cell cancer of right lung (HCC) 08/23/2018   Pneumonia    Pre-diabetes    Radiation-induced brachial  plexopathy    Recurrent right pleural effusion    Secondary malignant neoplasm of brain and spinal cord (HCC)    Subarachnoid hemorrhage (HCC) 2016   Voice hoarseness      Family History  Problem Relation Age of Onset   Diabetes Mother    Diabetes Brother    Colon cancer Neg Hx    Stomach cancer Neg Hx    Thyroid  cancer Neg Hx      Social History   Socioeconomic History   Marital status: Married    Spouse name: Nathanel   Number of children: 2   Years of education: Not on file   Highest education level: Not on file  Occupational History   Not on file  Tobacco Use   Smoking status: Former    Types: Cigarettes   Smokeless tobacco: Never  Vaping Use   Vaping status: Never Used  Substance and Sexual Activity   Alcohol use: Yes    Alcohol/week: 5.0 standard drinks of alcohol    Types: 5 Standard drinks or equivalent per week    Comment: occasionally   Drug use: No   Sexual activity: Not on file  Other Topics Concern   Not on file  Social History Narrative   Are you right handed or left handed? Right   Are you currently employed ?    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   Who lives with you? wife   What type of home  do you live in: 1 story or 2 story? Two    Caffeine  3 cups a day    Social Drivers of Corporate investment banker Strain: Not on file  Food Insecurity: No Food Insecurity (11/18/2023)   Hunger Vital Sign    Worried About Running Out of Food in the Last Year: Never true    Ran Out of Food in the Last Year: Never true  Transportation Needs: No Transportation Needs (11/18/2023)   PRAPARE - Administrator, Civil Service (Medical): No    Lack of Transportation (Non-Medical): No  Physical Activity: Not on file  Stress: Not on file  Social Connections: Not on file  Intimate Partner Violence: Not At Risk (11/18/2023)   Humiliation, Afraid, Rape, and Kick questionnaire    Fear of Current or Ex-Partner: No     Emotionally Abused: No    Physically Abused: No    Sexually Abused: No     Allergies  Allergen Reactions   Lorazepam  Anxiety    Per MD, patient gets restless/delirious after Ativan  administration.     Outpatient Medications Prior to Visit  Medication Sig Dispense Refill   acetaminophen  (TYLENOL ) 500 MG tablet Take 1,000 mg by mouth every 6 (six) hours as needed (for pain).     albuterol  (VENTOLIN  HFA) 108 (90 Base) MCG/ACT inhaler INHALE 2 PUFFS INTO THE LUNGS EVERY 4 (FOUR) HOURS AS NEEDED FOR WHEEZING OR SHORTNESS OF BREATH. 6.7 each 4   amLODipine  (NORVASC ) 5 MG tablet Take 0.5 tablets (2.5 mg total) by mouth in the morning. 30 tablet 2   atorvastatin  (LIPITOR) 20 MG tablet Take 20 mg by mouth in the morning.     baclofen  (LIORESAL ) 10 MG tablet Take 10 mg by mouth at bedtime.     cyclobenzaprine  (FLEXERIL ) 5 MG tablet TAKE 1 TABLET BY MOUTH THREE TIMES A DAY AS NEEDED FOR MUSCLE SPASMS 30 tablet 0   dexamethasone  (DECADRON ) 4 MG tablet Take 1 tablet twice a day the day before, the day of, and the day after chemotherapy. 40 tablet 2   DULoxetine  (CYMBALTA ) 60 MG capsule Take 1 capsule (60 mg total) by mouth daily. 90 capsule 3   fluticasone  (FLONASE ) 50 MCG/ACT nasal spray Place 1 spray into both nostrils daily. 1 mL 2   folic acid  (FOLVITE ) 1 MG tablet Take 1 tablet (1 mg total) by mouth daily. 30 tablet 2   levothyroxine  (SYNTHROID ) 100 MCG tablet Take 100 mcg by mouth daily before breakfast.     methylPREDNISolone  (MEDROL  DOSEPAK) 4 MG TBPK tablet Use as instructed 21 tablet 0   MIRALAX  17 GM/SCOOP powder Take 17 g by mouth daily as needed for mild constipation.     mirtazapine  (REMERON ) 15 MG tablet TAKE 1 TABLET BY MOUTH EVERYDAY AT BEDTIME 90 tablet 1   prochlorperazine  (COMPAZINE ) 10 MG tablet Take 1 tablet (10 mg total) by mouth every 6 (six) hours as needed. 30 tablet 2   TRELEGY ELLIPTA  100-62.5-25 MCG/INH AEPB Inhale 1 puff into the lungs in the morning.     venlafaxine   (EFFEXOR ) 37.5 MG tablet Take 37.5 mg by mouth in the morning.     No facility-administered medications prior to visit.    Review of Systems  Constitutional:  Negative for chills, fever, malaise/fatigue and weight loss.  HENT:  Negative for congestion, sinus pain and sore throat.   Eyes: Negative.   Respiratory:  Negative for cough, hemoptysis, sputum production, shortness of breath and wheezing.  Cardiovascular:  Negative for chest pain, palpitations, orthopnea, claudication and leg swelling.  Gastrointestinal:  Negative for abdominal pain, heartburn, nausea and vomiting.  Genitourinary: Negative.   Musculoskeletal:  Negative for joint pain and myalgias.  Skin:  Negative for rash.  Neurological:  Negative for weakness.  Endo/Heme/Allergies: Negative.   Psychiatric/Behavioral: Negative.     Objective:   Vitals:   02/10/24 1414  BP: 129/85  Pulse: (!) 104  SpO2: 97%  Weight: 128 lb (58.1 kg)  Height: 5' 8 (1.727 m)    Physical Exam Constitutional:      General: He is not in acute distress.    Appearance: Normal appearance.  Eyes:     General: No scleral icterus.    Conjunctiva/sclera: Conjunctivae normal.  Cardiovascular:     Rate and Rhythm: Normal rate and regular rhythm.  Pulmonary:     Breath sounds: Decreased air movement (right base) present. No wheezing, rhonchi or rales.  Musculoskeletal:     Right lower leg: No edema.     Left lower leg: No edema.  Skin:    General: Skin is warm and dry.  Neurological:     General: No focal deficit present.    CBC    Component Value Date/Time   WBC 2.7 (L) 02/09/2024 1407   WBC 15.4 (H) 11/21/2023 0407   RBC 3.98 (L) 02/09/2024 1407   HGB 11.5 (L) 02/09/2024 1407   HCT 35.8 (L) 02/09/2024 1407   PLT 107 (L) 02/09/2024 1407   MCV 89.9 02/09/2024 1407   MCH 28.9 02/09/2024 1407   MCHC 32.1 02/09/2024 1407   RDW 15.6 (H) 02/09/2024 1407   LYMPHSABS 0.4 (L) 02/09/2024 1407   MONOABS 0.6 02/09/2024 1407   EOSABS  0.0 02/09/2024 1407   BASOSABS 0.0 02/09/2024 1407      Latest Ref Rng & Units 02/09/2024    2:07 PM 02/02/2024    9:28 AM 01/26/2024    2:10 PM  BMP  Glucose 70 - 99 mg/dL 878  850  889   BUN 6 - 20 mg/dL 12  12  9    Creatinine 0.61 - 1.24 mg/dL 9.59  9.46  9.60   Sodium 135 - 145 mmol/L 137  137  139   Potassium 3.5 - 5.1 mmol/L 4.0  4.5  3.5   Chloride 98 - 111 mmol/L 100  99  101   CO2 22 - 32 mmol/L 32  29  32   Calcium  8.9 - 10.3 mg/dL 8.7  9.9  8.7    Chest imaging: CXR 01/05/24 Chronic right mid lung atelectatic changes, chronic right lower lobe atelectasis with a chronic right pleural reaction no significant effusion.   No acute infiltrates or consolidations   Ventriculoperitoneal shunt catheter projects over the left hemithorax   Heart normal size  CXR 11/22/23 Improved aeration of RIGHT lung post thoracentesis without residual pleural effusion. No pneumothorax.  NM PET Scan 10/22/23 1. Bilateral hypermetabolic lymph nodes in the lower neck/upper thoracic inlet consistent with nodal metastasis. 2. Hypermetabolic lymph node inferior to the carina consistent with nodal metastasis. 3. No lung nodules. 4. Large layering RIGHT pleural effusion.  PFT:    Latest Ref Rng & Units 08/18/2018    8:41 AM  PFT Results  FVC-Pre L 3.91   FVC-Predicted Pre % 82   FVC-Post L 4.06   FVC-Predicted Post % 85   Pre FEV1/FVC % % 79   Post FEV1/FCV % % 84   FEV1-Pre L 3.09  FEV1-Predicted Pre % 84   FEV1-Post L 3.39   DLCO uncorrected ml/min/mmHg 26.43   DLCO UNC% % 85   DLVA Predicted % 99   TLC L 5.72   TLC % Predicted % 84   RV % Predicted % 78     Labs:  Path:  Echo:  Heart Catheterization:     Assessment & Plan:   Adenocarcinoma of right lung, stage 4 (HCC)  Malignant pleural effusion  Discussion: Jahad Old is a 63 year old male, former smoker with COPD/Emphysema, 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   Stage IV NSCLC - followed by oncology, currently undergoing chemo infusions and tolerating them well - weight is stable and appetite is good  Malignant Right Pleural Effusion - no need for pleurX catheter at this time  Shortness of breath Likely related to pleural effusion and lung cancer. Discussed potential benefit of nebulizer for improved medication delivery. - Continue Trelegy inhaler, one puff daily. - Set up nebulizer machine and albuterol  solution for use during coughing episodes or increased dyspnea.  Follow up in 6 months  Dorn Chill, MD [facility] Office: [phone]   Current Outpatient Medications:    acetaminophen  (TYLENOL ) 500 MG tablet, Take 1,000 mg by mouth every 6 (six) hours as needed (for pain)., Disp: , Rfl:    albuterol  (VENTOLIN  HFA) 108 (90 Base) MCG/ACT inhaler, INHALE 2 PUFFS INTO THE LUNGS EVERY 4 (FOUR) HOURS AS NEEDED FOR WHEEZING OR SHORTNESS OF BREATH., Disp: 6.7 each, Rfl: 4   amLODipine  (NORVASC ) 5 MG tablet, Take 0.5 tablets (2.5 mg total) by mouth in the morning., Disp: 30 tablet, Rfl: 2   atorvastatin  (LIPITOR) 20 MG tablet, Take 20 mg by mouth in the morning., Disp: , Rfl:    baclofen  (LIORESAL ) 10 MG tablet, Take 10 mg by mouth at bedtime., Disp: , Rfl:    cyclobenzaprine  (FLEXERIL ) 5 MG tablet, TAKE 1 TABLET BY MOUTH THREE TIMES A DAY AS NEEDED FOR MUSCLE SPASMS, Disp: 30 tablet, Rfl: 0   dexamethasone  (DECADRON ) 4 MG tablet, Take 1 tablet twice a day the day before, the day of, and the day after chemotherapy., Disp: 40 tablet, Rfl: 2   DULoxetine  (CYMBALTA ) 60 MG capsule, Take 1 capsule (60 mg total) by mouth daily., Disp: 90 capsule, Rfl: 3   fluticasone  (FLONASE ) 50 MCG/ACT nasal spray, Place 1 spray into both nostrils daily., Disp: 1 mL, Rfl: 2   folic acid  (FOLVITE ) 1 MG tablet, Take 1 tablet  (1 mg total) by mouth daily., Disp: 30 tablet, Rfl: 2   levothyroxine  (SYNTHROID ) 100 MCG tablet, Take 100 mcg by mouth daily before breakfast., Disp: , Rfl:    methylPREDNISolone  (MEDROL  DOSEPAK) 4 MG TBPK tablet, Use as instructed, Disp: 21 tablet, Rfl: 0   MIRALAX  17 GM/SCOOP powder, Take 17 g by mouth daily as needed for mild constipation., Disp: , Rfl:    mirtazapine  (REMERON ) 15 MG tablet, TAKE 1 TABLET BY MOUTH EVERYDAY AT BEDTIME, Disp: 90 tablet, Rfl: 1   prochlorperazine  (COMPAZINE ) 10 MG tablet, Take 1 tablet (10 mg total) by mouth every 6 (six) hours as needed., Disp: 30 tablet, Rfl: 2   TRELEGY ELLIPTA  100-62.5-25 MCG/INH AEPB, Inhale 1 puff into the lungs in the morning., Disp: , Rfl:    venlafaxine  (EFFEXOR ) 37.5 MG tablet, Take 37.5 mg  by mouth in the morning., Disp: , Rfl:

## 2024-02-16 ENCOUNTER — Inpatient Hospital Stay

## 2024-02-16 ENCOUNTER — Ambulatory Visit (HOSPITAL_COMMUNITY)
Admission: RE | Admit: 2024-02-16 | Discharge: 2024-02-16 | Disposition: A | Discharge status: Home / Self Care | Source: Ambulatory Visit | Attending: Internal Medicine | Admitting: Internal Medicine

## 2024-02-16 DIAGNOSIS — N400 Enlarged prostate without lower urinary tract symptoms: Secondary | ICD-10-CM | POA: Diagnosis not present

## 2024-02-16 DIAGNOSIS — C3491 Malignant neoplasm of unspecified part of right bronchus or lung: Secondary | ICD-10-CM

## 2024-02-16 DIAGNOSIS — C3490 Malignant neoplasm of unspecified part of unspecified bronchus or lung: Secondary | ICD-10-CM | POA: Insufficient documentation

## 2024-02-16 DIAGNOSIS — J90 Pleural effusion, not elsewhere classified: Secondary | ICD-10-CM | POA: Diagnosis not present

## 2024-02-16 LAB — CBC WITH DIFFERENTIAL (CANCER CENTER ONLY)
Abs Immature Granulocytes: 0.01 K/uL (ref 0.00–0.07)
Basophils Absolute: 0 K/uL (ref 0.0–0.1)
Basophils Relative: 0 %
Eosinophils Absolute: 0.1 K/uL (ref 0.0–0.5)
Eosinophils Relative: 1 %
HCT: 33.5 % — ABNORMAL LOW (ref 39.0–52.0)
Hemoglobin: 11 g/dL — ABNORMAL LOW (ref 13.0–17.0)
Immature Granulocytes: 0 %
Lymphocytes Relative: 8 %
Lymphs Abs: 0.4 K/uL — ABNORMAL LOW (ref 0.7–4.0)
MCH: 29.6 pg (ref 26.0–34.0)
MCHC: 32.8 g/dL (ref 30.0–36.0)
MCV: 90.3 fL (ref 80.0–100.0)
Monocytes Absolute: 1.1 K/uL — ABNORMAL HIGH (ref 0.1–1.0)
Monocytes Relative: 20 %
Neutro Abs: 3.8 K/uL (ref 1.7–7.7)
Neutrophils Relative %: 71 %
Platelet Count: 141 K/uL — ABNORMAL LOW (ref 150–400)
RBC: 3.71 MIL/uL — ABNORMAL LOW (ref 4.22–5.81)
RDW: 15.7 % — ABNORMAL HIGH (ref 11.5–15.5)
WBC Count: 5.4 K/uL (ref 4.0–10.5)
nRBC: 0 % (ref 0.0–0.2)

## 2024-02-16 LAB — CMP (CANCER CENTER ONLY)
ALT: 12 U/L (ref 0–44)
AST: 13 U/L — ABNORMAL LOW (ref 15–41)
Albumin: 3.2 g/dL — ABNORMAL LOW (ref 3.5–5.0)
Alkaline Phosphatase: 104 U/L (ref 38–126)
Anion gap: 7 (ref 5–15)
BUN: 9 mg/dL (ref 6–20)
CO2: 31 mmol/L (ref 22–32)
Calcium: 8.8 mg/dL — ABNORMAL LOW (ref 8.9–10.3)
Chloride: 102 mmol/L (ref 98–111)
Creatinine: 0.41 mg/dL — ABNORMAL LOW (ref 0.61–1.24)
GFR, Estimated: >60 mL/min (ref >60)
Glucose, Bld: 109 mg/dL — ABNORMAL HIGH (ref 70–99)
Potassium: 3.9 mmol/L (ref 3.5–5.1)
Sodium: 140 mmol/L (ref 135–145)
Total Bilirubin: 0.2 mg/dL (ref 0.0–1.2)
Total Protein: 6.2 g/dL — ABNORMAL LOW (ref 6.5–8.1)

## 2024-02-16 MED ORDER — IOHEXOL 300 MG/ML  SOLN
100.0000 mL | Freq: Once | INTRAMUSCULAR | Status: AC | PRN
  Administered 2024-02-16: 80 mL via INTRAVENOUS

## 2024-02-17 ENCOUNTER — Ambulatory Visit: Admitting: Neurology

## 2024-02-17 ENCOUNTER — Encounter: Payer: Self-pay | Admitting: Neurology

## 2024-02-17 VITALS — BP 117/81 | HR 111 | Ht 68.0 in | Wt 125.0 lb

## 2024-02-17 DIAGNOSIS — M79602 Pain in left arm: Secondary | ICD-10-CM

## 2024-02-17 DIAGNOSIS — G540 Brachial plexus disorders: Secondary | ICD-10-CM

## 2024-02-17 DIAGNOSIS — M79601 Pain in right arm: Secondary | ICD-10-CM | POA: Diagnosis not present

## 2024-02-17 DIAGNOSIS — M792 Neuralgia and neuritis, unspecified: Secondary | ICD-10-CM

## 2024-02-17 DIAGNOSIS — R471 Dysarthria and anarthria: Secondary | ICD-10-CM

## 2024-02-17 DIAGNOSIS — R634 Abnormal weight loss: Secondary | ICD-10-CM

## 2024-02-17 DIAGNOSIS — M6259 Muscle wasting and atrophy, not elsewhere classified, multiple sites: Secondary | ICD-10-CM | POA: Diagnosis not present

## 2024-02-17 DIAGNOSIS — C3491 Malignant neoplasm of unspecified part of right bronchus or lung: Secondary | ICD-10-CM

## 2024-02-17 DIAGNOSIS — R29898 Other symptoms and signs involving the musculoskeletal system: Secondary | ICD-10-CM

## 2024-02-17 DIAGNOSIS — R1310 Dysphagia, unspecified: Secondary | ICD-10-CM

## 2024-02-17 MED ORDER — DULOXETINE HCL 60 MG PO CPEP: 60.0000 mg | ORAL_CAPSULE | Freq: Every day | ORAL | 3 refills | Status: DC

## 2024-02-17 NOTE — Patient Instructions (Addendum)
 I refilled the Cymbalta  (duloxetine ) today. Continue gabapentin .  I will see you again in about 3 months.  Please let me know if there is anything else you need in the meantime.  The physicians and staff at [facility] are committed to providing excellent care. You may receive a survey requesting feedback about your experience at our office. We strive to receive very good responses to the survey questions. If you feel that your experience would prevent you from giving the office a very good  response, please contact our office to try to remedy the situation. We may be reached at [phone]. Thank you for taking the time out of your busy day to complete the survey.  Venetia Potters, MD [facility]  Preventing Falls at [facility] are common, often dreaded events in the lives of older people. Aside from the obvious injuries and even death that may result, fall can cause wide-ranging consequences including loss of independence, mental decline, decreased activity and mobility. Younger people are also at risk of falling, especially those with chronic illnesses and fatigue.  Ways to reduce risk for falling Examine diet and medications. Warm foods and alcohol dilate blood vessels, which can lead to dizziness when standing. Sleep aids, antidepressants and pain medications can also increase the likelihood of a fall.  Get a vision exam. Poor vision, cataracts and glaucoma increase the chances of falling.  Check foot gear. Shoes should fit snugly and have a sturdy, nonskid sole and a broad, low heel  Participate in a physician-approved exercise program to build and maintain muscle strength and improve balance and coordination. Programs that use ankle weights or stretch bands are excellent for muscle-strengthening. Water  aerobics programs and low-impact Tai Chi programs have also been shown to improve balance and coordination.  Increase vitamin D intake. Vitamin D improves muscle strength  and increases the amount of calcium  the body is able to absorb and deposit in bones.  How to prevent falls from common hazards Floors - Remove all loose wires, cords, and throw rugs. Minimize clutter. Make sure rugs are anchored and smooth. Keep furniture in its usual place.  Chairs -- Use chairs with straight backs, armrests and firm seats. Add firm cushions to existing pieces to add height.  Bathroom - Install grab bars and non-skid tape in the tub or shower. Use a bathtub transfer bench or a shower chair with a back support Use an elevated toilet seat and/or safety rails to assist standing from a low surface. Do not use towel racks or bathroom tissue holders to help you stand.  Lighting - Make sure halls, stairways, and entrances are well-lit. Install a night light in your bathroom or hallway. Make sure there is a light switch at the top and bottom of the staircase. Turn lights on if you get up in the middle of the night. Make sure lamps or light switches are within reach of the bed if you have to get up during the night.  Kitchen - Install non-skid rubber mats near the sink and stove. Clean spills immediately. Store frequently used utensils, pots, pans between waist and eye level. This helps prevent reaching and bending. Sit when getting things out of lower cupboards.  Living room/ Bedrooms - Place furniture with wide spaces in between, giving enough room to move around. Establish a route through the living room that gives you something to hold onto as you walk.  Stairs - Make sure treads, rails, and rugs are secure. Install a rail on  both sides of the stairs. If stairs are a threat, it might be helpful to arrange most of your activities on the lower level to reduce the number of times you must climb the stairs.  Entrances and doorways - Install metal handles on the walls adjacent to the doorknobs of all doors to make it more secure as you travel through the doorway.  Tips for maintaining  balance Keep at least one hand free at all times. Try using a backpack or fanny pack to hold things rather than carrying them in your hands. Never carry objects in both hands when walking as this interferes with keeping your balance.  Attempt to swing both arms from front to back while walking. This might require a conscious effort if Parkinson's disease has diminished your movement. It will, however, help you to maintain balance and posture, and reduce fatigue.  Consciously lift your feet off of the ground when walking. Shuffling and dragging of the feet is a common culprit in losing your balance.  When trying to navigate turns, use a U technique of facing forward and making a wide turn, rather than pivoting sharply.  Try to stand with your feet shoulder-length apart. When your feet are close together for any length of time, you increase your risk of losing your balance and falling.  Do one thing at a time. Don't try to walk and accomplish another task, such as reading or looking around. The decrease in your automatic reflexes complicates motor function, so the less distraction, the better.  Do not wear rubber or gripping soled shoes, they might catch on the floor and cause tripping.  Move slowly when changing positions. Use deliberate, concentrated movements and, if needed, use a grab bar or walking aid. Count 15 seconds between each movement. For example, when rising from a seated position, wait 15 seconds after standing to begin walking.  If balance is a continuous problem, you might want to consider a walking aid such as a cane, walking stick, or walker. Once you've mastered walking with help, you might be ready to try it on your own again.

## 2024-02-19 NOTE — Progress Notes (Unsigned)
 [facility] OFFICE PROGRESS NOTE  [name], MD [address] [place] [address]  DIAGNOSIS: Metastatic non-small cell lung cancer initially diagnosed as stage IIIB (T1c, N3, M0) non-small cell lung cancer, adenocarcinoma presented with right upper lobe lung mass in addition to mediastinal and bilateral supraclavicular lymphadenopathy diagnosed in January 2020.  He had evidence of disease recurrence in June 2021 with adenopathy in the subcarinal, level 3 left neck lymph node, and lymph node at the curious of the diaphragm and in the upper abdomen.   PD-L1: 10%   Guardant 360 molecular studies showed no actionable mutation   Foundation One Testing:   Biomarker Findings Microsatellite status - MS-Stable Tumor Mutational Burden - 4 Muts/Mb Genomic Findings For a complete list of the genes assayed, please refer to the Appendix. ALK EML4-ALK fusion (Variant 2) SMARCB1 R377C CTNNB1 S45P CDKN2A/B CDKN2B loss, CDKN2A loss CXCR4 W106* TP53 splice site 672G>A 7 Disease relevant genes with no reportable alterations: BRAF, EGFR, ERBB2, KRAS, MET, RET, ROS1  PRIOR THERAPY: 1) Concurrent chemoradiation with weekly carboplatin  for AUC of 2 and paclitaxel  45 mg/M2.  Status post 6 cycles.  Last dose was given on October 10, 2018 with stable disease. 2) Radiation treatment to the enlarging right cervical lymph nodes under the care of Dr. Shannon. First treatment 03/06/2019. Last treatment scheduled on 04/13/2019 3) Consolidation treatment with immunotherapy with Imfinzi  10 mg/KG every 2 weeks.  First dose November 17, 2018.  Status post 26 cycles. 4) Systemic chemotherapy with carboplatin  for an AUC of 5, Alimta  500 mg/m2, and Keytruda  200 mg IV every 3 weeks. First dose expected on 02/14/20. Status post 1 cycle.  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 5) SBRT to the left lower lobe lung  nodule. 6) [name] 600 mg p.o. twice daily.  He started the first dose on March 08, 2020.   Status post 44 months of treatment.  Discontinued in March 2025 due to disease progression 7) [name] 100 mg daily.  Discontinued in April 2025 due to severe intolerance due to mental status changes/confusion  8) Radiation to the bilateral cervical lymph nodes under the care of Dr. Dewey last day expected on 11/17/2023   CURRENT THERAPY: Palliative systemic chemotherapy with carboplatin  for an AUC of 4 (dose reduced), Alimta  400 mg/m (dose reduced), and [name]  IV every 3 weeks.  First dose expected on 12/23/2023. His dose will be increased to carboplatin  for an AUC of 5 and Alimta  500 mg starting from cycle #2. He is status post 3 cycles.   INTERVAL HISTORY: Brian [name] 63 y.o. male returns to the clinic today for a follow-up visit accompanied by his wife. The patient was last seen in the clinic 3 weeks ago by Dr. Sherrod.   The plan is to continue 6 cycles of chemotherapy before discontinuing carboplatin . The patient is status post 3 cycles total.   The patient saw neurology in the interval since last being seen. They discussed it would be unlikely that he would regain function in his RUE. He is on gabapentin  and Cymbalta . He also continues to have hoarseness due to vocal cord paralysis.   He denies fevers or night sweats. He has a good appetite but his wife has to feed him due to the upper extremity weakness. He also has a retired Engineer, civil (consulting) that comes by the house to help. The patient thinks he could  eat more than he is currently eating. He lost weight.   He tolerates treatment fairly well. He gets tired the day of treatment. He feels better the following day, then he feels a little tired on day 4-5.   He has cold intolerance without chills. He denies fever. His breathing is generally okay but worsens with exertion. He has used supplemental oxygen  once this week but not  regularly. His shortness of breath is worse compared to his last visit. He uses Trelegy daily and albuterol  rarely.  He has history of pleural effusions requiring thoracentesis.  However his most recent scans have showed increased loculations.  He denies significant cough. He denies cough, hemoptysis, or chest pain. He denies nausea, vomiting, diarrhea, or constipation. He takes miralax  if needed for constipation. He denies headache or vision changes. He denies rashes. He bruises easily but denies bleeding.    The patient recently had a restaging CT scan. 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  MEDICATIONS:  Current Outpatient Medications  Medication Sig Dispense Refill   acetaminophen  (TYLENOL ) 500 MG tablet Take 1,000 mg by mouth every 6 (six) hours as needed (for pain).     albuterol  (VENTOLIN  HFA) 108 (90 Base) MCG/ACT inhaler INHALE 2 PUFFS INTO THE LUNGS EVERY 4 (FOUR) HOURS AS NEEDED FOR WHEEZING OR SHORTNESS OF BREATH. 6.7 each 4   amLODipine  (NORVASC ) 5 MG tablet Take 0.5 tablets (2.5 mg total) by mouth in the morning. 30 tablet 2   atorvastatin  (LIPITOR) 20 MG tablet Take 20 mg by mouth in the morning.     baclofen  (LIORESAL ) 10 MG tablet  Take 10 mg by mouth at bedtime.     dexamethasone  (DECADRON ) 4 MG tablet Take 1 tablet twice a day the day before, the day of, and the day after chemotherapy. 40 tablet 2   DULoxetine  (CYMBALTA ) 60 MG capsule Take 1 capsule (60 mg total) by mouth daily. 90 capsule 3   folic acid  (FOLVITE ) 1 MG tablet Take 1 tablet (1 mg total) by mouth daily. 30 tablet 2   gabapentin  (NEURONTIN ) 600 MG tablet Take 600 mg by mouth.     levothyroxine  (SYNTHROID ) 100 MCG tablet Take 100 mcg by mouth daily before breakfast.     MIRALAX  17 GM/SCOOP powder Take 17 g by mouth daily as needed for mild constipation.     mirtazapine  (REMERON ) 15 MG tablet TAKE 1 TABLET BY MOUTH EVERYDAY AT BEDTIME 90 tablet 1   prochlorperazine  (COMPAZINE ) 10 MG tablet Take 1 tablet (10 mg total) by mouth every 6 (six) hours as needed. 30 tablet 2   TRELEGY ELLIPTA  100-62.5-25 MCG/INH AEPB Inhale 1 puff into  the lungs in the morning.     venlafaxine  (EFFEXOR ) 37.5 MG tablet Take 37.5 mg by mouth in the morning.     cyclobenzaprine  (FLEXERIL ) 5 MG tablet TAKE 1 TABLET BY MOUTH THREE TIMES A DAY AS NEEDED FOR MUSCLE SPASMS (Patient not taking: Reported on 02/23/2024) 30 tablet 0   fluticasone  (FLONASE ) 50 MCG/ACT nasal spray Place 1 spray into both nostrils daily. (Patient not taking: Reported on 02/23/2024) 1 mL 2   methylPREDNISolone  (MEDROL  DOSEPAK) 4 MG TBPK tablet Use as instructed (Patient not taking: Reported on 02/23/2024) 21 tablet 0   No current facility-administered medications for this visit.    SURGICAL HISTORY:  Past Surgical History:  Procedure Laterality Date   COLONOSCOPY     FEMUR FRACTURE SURGERY Left 1969   INGUINAL HERNIA REPAIR Right 06/24/2020   Procedure: RIGHT OPEN INGUINAL HERNIA REPAIR WITH MESH;  Surgeon: Kinsinger, Herlene Righter, MD;  Location: [facility] ORS;  Service: General;  Laterality: Right;   IR IVC FILTER PLMT / S&I PORTER GUID/MOD SED  03/04/2020   IR IVC FILTER RETRIEVAL / S&I PORTER GUID/MOD SED  08/15/2020   IR  RADIOLOGIST EVAL & MGMT  08/06/2020   SPINAL CORD STIMULATOR INSERTION N/A 10/05/2023   Procedure: CERVICAL SPINAL CORD STIMULATOR PLACEMENT;  Surgeon: Claudene Penne ORN, MD;  Location: [facility] ORS;  Service: Neurosurgery;  Laterality: N/A;   THORACENTESIS Right 12/10/2023   Procedure: THORACENTESIS;  Surgeon: Kara Dorn NOVAK, MD;  Location: [facility] ENDOSCOPY;  Service: Pulmonary;  Laterality: Right;    REVIEW OF SYSTEMS:   Review of Systems  Constitutional: Stable fatigue. Positive for weight loss. Negative for chills, fever and unexpected weight change.  HENT: Positive for stable hoarseness. Negative for mouth sores, nosebleeds, sore throat and trouble swallowing.   Eyes: Negative for eye problems and icterus.  Respiratory: Positive for intermittent dyspnea on exertion. Negative for cough, hemoptysis, and wheezing.   Cardiovascular: Negative for chest pain and leg swelling.  Gastrointestinal: Positive for mild occasional constipation. Negative for abdominal pain, diarrhea, nausea and vomiting.  Genitourinary: Negative for bladder incontinence, difficulty urinating, dysuria, frequency and hematuria.   Musculoskeletal: Negative for back pain, gait problem, neck pain and neck stiffness.  Skin: Negative for itching and rash.  Neurological: Right upper extremity weakness. Negative for dizziness, gait problem, headaches, light-headedness and seizures.  Hematological: Negative for adenopathy. Does not bleed easily. Positive for easy bruising.  Psychiatric/Behavioral: Negative for confusion, depression and sleep disturbance. The patient is not nervous/anxious.  PHYSICAL EXAMINATION:  Blood pressure 133/88, pulse (!) 105, temperature 98.2 F (36.8 C), temperature source Tympanic, resp. rate 18, height 5' 8 (1.727 m), weight 124 lb 14.4 oz (56.7 kg), SpO2 96%.  ECOG PERFORMANCE STATUS: 2  Physical Exam  Constitutional: Oriented to person, place, and time and chronically ill appearing male and in no  distress.  HENT:  Head: Normocephalic and atraumatic.  Mouth/Throat: Oropharynx is clear and moist. No oropharyngeal exudate.  Eyes: Conjunctivae are normal. Right eye exhibits no discharge. Left eye exhibits no discharge. No scleral icterus.  Neck: Normal range of motion. Neck supple.  Cardiovascular: Normal rate, regular rhythm, normal heart sounds and intact distal pulses.   Pulmonary/Chest: Effort normal. Decreased breath sounds in right lower lung. No respiratory distress. No wheezes. No rales. On supplemental oxygen .  Abdominal: Soft. Bowel sounds are normal. Exhibits no distension and no mass. There is no tenderness.  Musculoskeletal: Flaccid right upper extremity. RUE edema and flaccid weakness.  Lymphadenopathy:    Bilateral cervical lymphadenopathy.  Neurological: Alert and oriented to person, place, and time. Exhibits muscle wasting.  Skin: Skin is warm and dry. Positive for some RUE bruising. No rash noted. Not diaphoretic. No erythema. No pallor.  Psychiatric: Mood, memory and judgment normal.  Vitals reviewed.  LABORATORY DATA: Lab Results  Component Value Date   WBC 8.9 02/23/2024   HGB 12.5 (L) 02/23/2024   HCT 38.1 (L) 02/23/2024   MCV 89.9 02/23/2024   PLT 404 (H) 02/23/2024      Chemistry      Component Value Date/Time   NA 138 02/23/2024 1030   K 4.3 02/23/2024 1030   CL 100 02/23/2024 1030   CO2 28 02/23/2024 1030   BUN 13 02/23/2024 1030   CREATININE 0.50 (L) 02/23/2024 1030      Component Value Date/Time   CALCIUM  9.8 02/23/2024 1030   ALKPHOS 108 02/23/2024 1030   AST 14 (L) 02/23/2024 1030   ALT 11 02/23/2024 1030   BILITOT 0.2 02/23/2024 1030       RADIOGRAPHIC STUDIES:  CT CHEST ABDOMEN PELVIS W CONTRAST Result Date: 02/22/2024 CLINICAL DATA:  Non-small-cell lung cancer. * Tracking Code: BO *. EXAM: CT CHEST, ABDOMEN, AND PELVIS WITH CONTRAST TECHNIQUE: Multidetector CT imaging of the chest, abdomen and pelvis was performed following the  standard protocol during bolus administration of intravenous contrast. RADIATION DOSE REDUCTION: This exam was performed according to the departmental dose-optimization program which includes automated exposure control, adjustment of the mA and/or kV according to patient size and/or use of iterative reconstruction technique. CONTRAST:  80mL OMNIPAQUE  IOHEXOL  300 MG/ML  SOLN COMPARISON:  CT chest abdomen pelvis 08/13/2023.  PET-CT 10/22/2023. FINDINGS: CT CHEST FINDINGS Cardiovascular: Heart is nonenlarged. Trace pericardial fluid. Mild coronary artery calcifications. Please correlate for other coronary risk factors. Thoracic aorta has a normal course and caliber with mild atherosclerotic plaque. Mediastinum/Nodes: Slightly patulous thoracic esophagus. There is wall thickening along the course of the esophagus including at the level of the carina. This area was hypermetabolic on the prior examination. Example area on image 22 of series 2 measures 2.2 by 2.0 cm. Please correlate with workup in symptoms. This lesion could be a node adjacent to the esophagus versus involving the wall of the esophagus. Please correlate with symptoms. On the prior PET-CT there are several hypermetabolic nodes and soft tissue lesions along the neck and supraclavicular areas. Please see separate neck CT scan from same day. Today there is no specific abnormal lymph node enlargement identified in the axillary region or hila. Lungs/Pleura: Right-sided pleural effusion has more loculations today. More fluid tracking along the course of the major fissure. Overall size of the effusion is slightly smaller. Significant residual, least moderate. Adjacent patchy parenchymal opacities identified. Tiny left effusion. Mild breathing motion. No pneumothorax. No new dominant lung mass. There is some tiny nodules. Example left upper lobe on series 4, image 43 measuring 2-3 mm. This is not clearly seen on the CT scan of January 2025. Similar focus left  lower lobe medially measuring 4 mm on series 4, image 84. Again not seen previously. Recommend simple follow up surveillance for the patient's neoplasm. Musculoskeletal: Scattered degenerative changes. CT ABDOMEN PELVIS FINDINGS Hepatobiliary: No focal liver abnormality is seen. No gallstones, gallbladder wall thickening, or biliary dilatation. Pancreas: Mild parenchymal atrophy globally.  No obvious mass. Spleen: Normal in size without focal abnormality. Adrenals/Urinary Tract: Adrenal glands are preserved. Bosniak 1 upper pole left-sided renal cyst is stable. No enhancing renal mass or collecting system dilatation. The ureters  have normal course and caliber extending down to the urinary bladder. Slight wall thickening of the urinary bladder. Nonspecific. Stomach/Bowel: Breathing motion. The large bowel has a normal caliber. Diffuse colonic stool. Redundant course of the sigmoid colon extending into the mid abdomen. Stomach and small bowel are nondilated. Normal appendix. Vascular/Lymphatic: Aortic atherosclerosis. No enlarged abdominal or pelvic lymph nodes. Reproductive: Large prostate. Please correlate with the patient's PSA. Other: Anasarca.  No definite free air or free fluid. Musculoskeletal: Scattered degenerative changes identified. Slight curvature of the spine. Battery pack seen posterior along the left side of the abdomen in CBT knee is tissues with lead extending to the cervical region. Please correlate with the patient's neck CT. IMPRESSION: Persistent mass corresponding the area of abnormal uptake along the course of the midthoracic esophagus. This could be esophageal versus an adjacent node. Please correlate with symptoms and workup. Abnormal soft tissue along the thoracic inlet is better described on the prior neck CT scan. More complex appearing right-sided pleural effusion with loculations and fluid tracking along the major fissure. The amount of pleural fluid is slightly decreased from previous  examination. Tiny left effusion. Few tiny lung nodules identified which are new but under 5 mm. Recommend attention on follow-up. Enlarged prostate. Please correlate with patient's PSA. No bowel obstruction. Electronically Signed   By: Ranell Bring M.D.   On: 02/22/2024 12:18   CT Soft Tissue Neck W Contrast Result Date: 02/17/2024 CLINICAL DATA:  Neck mass, history of lung cancer EXAM: CT NECK WITH CONTRAST TECHNIQUE: Multidetector CT imaging of the neck was performed using the standard protocol following the bolus administration of intravenous contrast. RADIATION DOSE REDUCTION: This exam was performed according to the departmental dose-optimization program which includes automated exposure control, adjustment of the mA and/or kV according to patient size and/or use of iterative reconstruction technique. CONTRAST:  80mL OMNIPAQUE  IOHEXOL  300 MG/ML  SOLN COMPARISON:  September 27, 2023 FINDINGS: There is a mass at the base of the neck which involves the anterior scalene and encases the right subclavian artery. The mass is difficult to measure as it is difficult to separate from the anterior scalene, but appears smaller than on the prior study. The left supraclavicular lymphadenopathy and left base of neck adenopathy noted on the prior study has improved. Pharynx: The nasopharynx, oropharynx and hypopharynx are normal Oral cavity/floor of mouth: Normal Larynx: Right vocal cord paralysis Salivary glands: The parotid and submandibular glands are normal Thyroid : Atrophic, otherwise normal. Vascular: No significant abnormality Limited intracranial: No significant abnormality Visualized orbits: No significant abnormality Mastoids and visualized paranasal sinuses: Right ethmoid sinus disease Skeleton: There is a cervical spinal stimulator in place Upper chest: There is a large right pleural effusion. Refer to chest CT report. Other: None IMPRESSION: 1. Ill-defined mass at the base of the neck on the right inseparable  from the anterior scalene muscle and encasing the right subclavian artery is smaller than on the prior study 2. Right vocal cord paralysis unchanged 3. Left cervical and supraclavicular adenopathy is improved Electronically Signed   By: Nancyann Burns M.D.   On: 02/17/2024 11:21     ASSESSMENT/PLAN:  This is a very pleasant 64 year old Caucasian male with recurrent lung cancer initially diagnosed with stage IIIb non-small cell lung cancer, adenocarcinoma.  He presented with a right upper lobe lung mass in addition to mediastinal and bilateral supraclavicular lymphadenopathy.  He was diagnosed in January 2020.  His PDL 1 expression is 10% and he has no actionable mutations.  He underwent a course of concurrent chemoradiation with weekly carboplatin  and paclitaxel . He is status post 6 cycles.  He tolerated well except for fatigue and odynophagia.    He completed his 26 cycles of consolidation immunotherapy with Imfinzi  10 mg/kg IV every 2 weeks. He tolerated it well without any adverse side effects except has a very mild skin rash over his right cervical area. He completed radiation to the enlarging right cervical lymph nodes. His last radiation treatment was on 04/13/2019.   He received 1 cycle of systemic chemotherapy with carboplatin , Alimta  and Keytruda . Repeat tissue biopsy from the left supraclavicular lymph nodes and molecular studies showed that the patient has positive ALK gene translocation. His previous molecular studies by [employer] 360 was negative. Dr. Sherrod discontinue his systemic chemotherapy for now because of the new findings on the molecular studies.    The patient has been on treatment with [name]  600 mg p.o. twice daily.  He is status post 43 months of treatment.    He unfortunately was recently found to have evidence of disease progression and his treatment was switched to 100 mg of [name]  p.o. daily.  He started this on 10/20/2023.    He completed palliative radiation to  the cervical lymph nodes on 11/17/23.   The patient had significant intolerance to [name]  and he was recently hospitalized with hallucinations. He was discharged on 11/22/23.    The patient saw [facility] for second opinion who recommends chemotherapy.    He is currently undergoing systemic chemotherapy with carboplatin  for an AUC of 4 (dose reduced) and Alimta  400 mg/m (dose reduced), and [name]  IV every 3 weeks. He is status post 3 cycles of treatment.   The patient was seen with Dr. Sherrod today.  Dr. Sherrod personally and independently reviewed the scan and discussed results with the patient today.  The scan showed no evidence of disease progression and the disease improved in the neck.  Dr. Sherrod recommends he continue on the same treatment at the same dose.   Labs were reviewed. He will proceed with cycle #4 today as scheduled. He is ok to treat with his pulse.   After cycle #6, he will no longer be on carboplatin .   We will continue to monitor his labs closely on a weekly basis.   Constipation Constipation managed effectively with Miralax . - Continue Miralax  as needed.  Chest tightness Intermittent chest tightness related to pleural effusion and cancer. - Use albuterol  as needed.  Pleural effusion with loculations Pleural effusion with loculations in right lung is large and non-free-flowing, complicating drainage. Loculations increased since last evaluation. Cancer treatment may reduce effusion. - Dr. Sherrod and I reviewed imaging. Unlikely will be able to get significant fluid drained to improve breathing due to loculations. Will monitor for now.    Patient saw pulmonary medicine and he is not interested in a Pleurx at this time.  He undergoes thoracentesis on as needed basis the most recent being on 12/10/2023. He knows to contact them if he feels like he requires another thoracentesis.    He will continue taking Remeron  for his decreased appetite. He has a caregiver which  helps him at home when his wife is not available.   Can consider extending steroid use for 1-2 days post-chemotherapy if fatigue significant.   The patient was advised to call immediately if she has any concerning symptoms in the interval. The patient voices understanding of current disease status and treatment options and is in agreement with the current  care plan. All questions were answered. The patient knows to call the clinic with any problems, questions or concerns. We can certainly see the patient much sooner if necessary    No orders of the defined types were placed in this encounter.     Sarie Stall L Jameka Ivie, PA-C 02/23/24  ADDENDUM: Hematology/Oncology Attending: I had a face-to-face encounter with the patient today.  I reviewed his records, lab, scan and recommended his care plan.  This is a very pleasant 63 years old white male with metastatic non-small cell lung cancer, adenocarcinoma that was initially diagnosed as a stage IIIb in January 2020 with evidence for disease recurrence in June 2021.  He has positive ALK gene translocation as well as PD-L1 expression of 10%.  He was initially treated with a course of concurrent chemoradiation followed by consolidation immunotherapy with Imfinzi  then on disease recurrence he was treated with [name] status post 44 months of treatment discontinued in March 2025 secondary to disease progression.  He was tried on treatment with [name]  100 mg p.o. daily but this was discontinued after few weeks secondary to significant intolerance with mental status change and hallucination and confusion.  The patient is currently undergoing systemic chemotherapy with carboplatin , Alimta  and [name]  status post 3 cycles.  He has been tolerating this treatment well with no concerning adverse effects except for the baseline fatigue.  He continues to have shortness of breath with exertion and intermittent pain on the right side of the chest  secondary to loculated right pleural effusion. He had repeat CT scan of the chest, abdomen pelvis performed recently.  I personally independently reviewed the scan images and discussed the result and showed the images to the patient today.  His scan showed no concerning findings for disease progression. I recommended for the patient to continue his current treatment with the same regimen and he will proceed with cycle #4 of his treatment today. He will come back for follow-up visit in 3 weeks for reevaluation before the next cycle of his treatment. The patient is advised to call immediately if he has any other concerning symptoms in the interval. The total time spent in the appointment was 30 minutes including review of chart and various tests results, discussions about plan of care and coordination of care plan . Disclaimer: This note was dictated with voice recognition software. Similar sounding words can inadvertently be transcribed and may be missed upon review. Sherrod MARLA Sherrod, MD

## 2024-02-22 DIAGNOSIS — J189 Pneumonia, unspecified organism: Secondary | ICD-10-CM | POA: Diagnosis not present

## 2024-02-22 MED FILL — Fosaprepitant Dimeglumine For IV Infusion 150 MG (Base Eq): INTRAVENOUS | Qty: 5 | Status: AC

## 2024-02-23 ENCOUNTER — Inpatient Hospital Stay (HOSPITAL_BASED_OUTPATIENT_CLINIC_OR_DEPARTMENT_OTHER): Admitting: Physician Assistant

## 2024-02-23 ENCOUNTER — Inpatient Hospital Stay

## 2024-02-23 VITALS — BP 133/88 | HR 105 | Temp 98.2°F | Resp 18 | Ht 68.0 in | Wt 124.9 lb

## 2024-02-23 DIAGNOSIS — C3491 Malignant neoplasm of unspecified part of right bronchus or lung: Secondary | ICD-10-CM

## 2024-02-23 DIAGNOSIS — Z5111 Encounter for antineoplastic chemotherapy: Secondary | ICD-10-CM | POA: Diagnosis not present

## 2024-02-23 DIAGNOSIS — Z9221 Personal history of antineoplastic chemotherapy: Secondary | ICD-10-CM | POA: Diagnosis not present

## 2024-02-23 DIAGNOSIS — Z5112 Encounter for antineoplastic immunotherapy: Secondary | ICD-10-CM | POA: Diagnosis not present

## 2024-02-23 DIAGNOSIS — C3411 Malignant neoplasm of upper lobe, right bronchus or lung: Secondary | ICD-10-CM | POA: Diagnosis not present

## 2024-02-23 DIAGNOSIS — C7931 Secondary malignant neoplasm of brain: Secondary | ICD-10-CM | POA: Diagnosis not present

## 2024-02-23 DIAGNOSIS — Z923 Personal history of irradiation: Secondary | ICD-10-CM | POA: Diagnosis not present

## 2024-02-23 LAB — CBC WITH DIFFERENTIAL (CANCER CENTER ONLY)
Abs Immature Granulocytes: 0.05 K/uL (ref 0.00–0.07)
Basophils Absolute: 0 K/uL (ref 0.0–0.1)
Basophils Relative: 0 %
Eosinophils Absolute: 0 K/uL (ref 0.0–0.5)
Eosinophils Relative: 0 %
HCT: 38.1 % — ABNORMAL LOW (ref 39.0–52.0)
Hemoglobin: 12.5 g/dL — ABNORMAL LOW (ref 13.0–17.0)
Immature Granulocytes: 1 %
Lymphocytes Relative: 4 %
Lymphs Abs: 0.4 K/uL — ABNORMAL LOW (ref 0.7–4.0)
MCH: 29.5 pg (ref 26.0–34.0)
MCHC: 32.8 g/dL (ref 30.0–36.0)
MCV: 89.9 fL (ref 80.0–100.0)
Monocytes Absolute: 1.6 K/uL — ABNORMAL HIGH (ref 0.1–1.0)
Monocytes Relative: 18 %
Neutro Abs: 6.9 K/uL (ref 1.7–7.7)
Neutrophils Relative %: 77 %
Platelet Count: 404 K/uL — ABNORMAL HIGH (ref 150–400)
RBC: 4.24 MIL/uL (ref 4.22–5.81)
RDW: 16.6 % — ABNORMAL HIGH (ref 11.5–15.5)
WBC Count: 8.9 K/uL (ref 4.0–10.5)
nRBC: 0 % (ref 0.0–0.2)

## 2024-02-23 LAB — CMP (CANCER CENTER ONLY)
ALT: 11 U/L (ref 0–44)
AST: 14 U/L — ABNORMAL LOW (ref 15–41)
Albumin: 3.8 g/dL (ref 3.5–5.0)
Alkaline Phosphatase: 108 U/L (ref 38–126)
Anion gap: 10 (ref 5–15)
BUN: 13 mg/dL (ref 6–20)
CO2: 28 mmol/L (ref 22–32)
Calcium: 9.8 mg/dL (ref 8.9–10.3)
Chloride: 100 mmol/L (ref 98–111)
Creatinine: 0.5 mg/dL — ABNORMAL LOW (ref 0.61–1.24)
GFR, Estimated: >60 mL/min (ref >60)
Glucose, Bld: 116 mg/dL — ABNORMAL HIGH (ref 70–99)
Potassium: 4.3 mmol/L (ref 3.5–5.1)
Sodium: 138 mmol/L (ref 135–145)
Total Bilirubin: 0.2 mg/dL (ref 0.0–1.2)
Total Protein: 7.3 g/dL (ref 6.5–8.1)

## 2024-02-23 MED ORDER — PALONOSETRON HCL INJECTION 0.25 MG/5ML
0.2500 mg | Freq: Once | INTRAVENOUS | Status: AC
  Administered 2024-02-23: 0.25 mg via INTRAVENOUS
  Filled 2024-02-23: qty 5

## 2024-02-23 MED ORDER — SODIUM CHLORIDE 0.9 % IV SOLN: INTRAVENOUS | Status: DC

## 2024-02-23 MED ORDER — FAMOTIDINE IN NACL 20-0.9 MG/50ML-% IV SOLN
20.0000 mg | Freq: Once | INTRAVENOUS | Status: AC
  Administered 2024-02-23: 20 mg via INTRAVENOUS
  Filled 2024-02-23: qty 50

## 2024-02-23 MED ORDER — DEXAMETHASONE SODIUM PHOSPHATE 10 MG/ML IJ SOLN
10.0000 mg | Freq: Once | INTRAMUSCULAR | Status: AC
  Administered 2024-02-23: 10 mg via INTRAVENOUS
  Filled 2024-02-23: qty 1

## 2024-02-23 MED ORDER — DIPHENHYDRAMINE HCL 50 MG/ML IJ SOLN
25.0000 mg | Freq: Once | INTRAMUSCULAR | Status: AC
  Administered 2024-02-23: 25 mg via INTRAVENOUS
  Filled 2024-02-23: qty 1

## 2024-02-23 MED ORDER — SODIUM CHLORIDE 0.9 % IV SOLN
546.0000 mg | Freq: Once | INTRAVENOUS | Status: AC
  Administered 2024-02-23: 550 mg via INTRAVENOUS
  Filled 2024-02-23: qty 55

## 2024-02-23 MED ORDER — SODIUM CHLORIDE 0.9 % IV SOLN
15.0000 mg/kg | Freq: Once | INTRAVENOUS | Status: AC
  Administered 2024-02-23: 900 mg via INTRAVENOUS
  Filled 2024-02-23: qty 4

## 2024-02-23 MED ORDER — SODIUM CHLORIDE 0.9 % IV SOLN
500.0000 mg/m2 | Freq: Once | INTRAVENOUS | Status: AC
  Administered 2024-02-23: 900 mg via INTRAVENOUS
  Filled 2024-02-23: qty 20

## 2024-02-23 MED ORDER — FOSAPREPITANT DIMEGLUMINE INJECTION 150 MG
150.0000 mg | Freq: Once | INTRAVENOUS | Status: AC
  Administered 2024-02-23: 150 mg via INTRAVENOUS
  Filled 2024-02-23: qty 150

## 2024-02-23 NOTE — Progress Notes (Signed)
 Ok to treat with elevated heart rate per Cassie, PA.

## 2024-02-23 NOTE — Patient Instructions (Signed)
 [facility] - A DEPT OF [facility]   Discharge Instructions: Thank you for choosing [facility] to provide your oncology and hematology care.   If you have a lab appointment with the Cancer Center, please go directly to the Cancer Center and check in at the registration area.   Wear comfortable clothing and clothing appropriate for easy access to any Portacath or PICC line.   We strive to give you quality time with your provider. You may need to reschedule your appointment if you arrive late (15 or more minutes).  Arriving late affects you and other patients whose appointments are after yours.  Also, if you miss three or more appointments without notifying the office, you may be dismissed from the clinic at the provider's discretion.      For prescription refill requests, have your pharmacy contact our office and allow 72 hours for refills to be completed.    Today you received the following chemotherapy and/or immunotherapy agents: Bevacizumab  ([name] ), Pemetrexed  (Alimta ), and Carboplatin       To help prevent nausea and vomiting after your treatment, we encourage you to take your nausea medication as directed.  BELOW ARE SYMPTOMS THAT SHOULD BE REPORTED IMMEDIATELY: *FEVER GREATER THAN 100.4 F (38 C) OR HIGHER *CHILLS OR SWEATING *NAUSEA AND VOMITING THAT IS NOT CONTROLLED WITH YOUR NAUSEA MEDICATION *UNUSUAL SHORTNESS OF BREATH *UNUSUAL BRUISING OR BLEEDING *URINARY PROBLEMS (pain or burning when urinating, or frequent urination) *BOWEL PROBLEMS (unusual diarrhea, constipation, pain near the anus) TENDERNESS IN MOUTH AND THROAT WITH OR WITHOUT PRESENCE OF ULCERS (sore throat, sores in mouth, or a toothache) UNUSUAL RASH, SWELLING OR PAIN  UNUSUAL VAGINAL DISCHARGE OR ITCHING   Items with * indicate a potential emergency and should be followed up as soon as possible or go to the Emergency Department if any problems should  occur.  Please show the CHEMOTHERAPY ALERT CARD or IMMUNOTHERAPY ALERT CARD at check-in to the Emergency Department and triage nurse.  Should you have questions after your visit or need to cancel or reschedule your appointment, please contact [facility] - A DEPT OF [facility]  Dept: [phone]  and follow the prompts.  Office hours are 8:00 a.m. to 4:30 p.m. Monday - Friday. Please note that voicemails left after 4:00 p.m. may not be returned until the following business day.  We are closed weekends and major holidays. You have access to a nurse at all times for urgent questions. Please call the main number to the clinic Dept: [phone] and follow the prompts.   For any non-urgent questions, you may also contact your provider using MyChart. We now offer e-Visits for anyone 24 and older to request care online for non-urgent symptoms. For details visit [email or web address].   Also download the MyChart app! Go to the app store, search MyChart, open the app, select [facility], and log in with your MyChart username and password.

## 2024-03-01 ENCOUNTER — Inpatient Hospital Stay

## 2024-03-01 DIAGNOSIS — Z923 Personal history of irradiation: Secondary | ICD-10-CM | POA: Diagnosis not present

## 2024-03-01 DIAGNOSIS — C3411 Malignant neoplasm of upper lobe, right bronchus or lung: Secondary | ICD-10-CM | POA: Diagnosis not present

## 2024-03-01 DIAGNOSIS — Z5111 Encounter for antineoplastic chemotherapy: Secondary | ICD-10-CM | POA: Diagnosis not present

## 2024-03-01 DIAGNOSIS — C3491 Malignant neoplasm of unspecified part of right bronchus or lung: Secondary | ICD-10-CM

## 2024-03-01 DIAGNOSIS — C7931 Secondary malignant neoplasm of brain: Secondary | ICD-10-CM | POA: Diagnosis not present

## 2024-03-01 DIAGNOSIS — Z9221 Personal history of antineoplastic chemotherapy: Secondary | ICD-10-CM | POA: Diagnosis not present

## 2024-03-01 DIAGNOSIS — Z5112 Encounter for antineoplastic immunotherapy: Secondary | ICD-10-CM | POA: Diagnosis not present

## 2024-03-01 LAB — CMP (CANCER CENTER ONLY)
ALT: 14 U/L (ref 0–44)
AST: 16 U/L (ref 15–41)
Albumin: 3.3 g/dL — ABNORMAL LOW (ref 3.5–5.0)
Alkaline Phosphatase: 99 U/L (ref 38–126)
Anion gap: 6 (ref 5–15)
BUN: 11 mg/dL (ref 6–20)
CO2: 33 mmol/L — ABNORMAL HIGH (ref 22–32)
Calcium: 8.9 mg/dL (ref 8.9–10.3)
Chloride: 100 mmol/L (ref 98–111)
Creatinine: 0.41 mg/dL — ABNORMAL LOW (ref 0.61–1.24)
GFR, Estimated: >60 mL/min (ref >60)
Glucose, Bld: 104 mg/dL — ABNORMAL HIGH (ref 70–99)
Potassium: 3.8 mmol/L (ref 3.5–5.1)
Sodium: 139 mmol/L (ref 135–145)
Total Bilirubin: 0.3 mg/dL (ref 0.0–1.2)
Total Protein: 6.4 g/dL — ABNORMAL LOW (ref 6.5–8.1)

## 2024-03-01 LAB — CBC WITH DIFFERENTIAL (CANCER CENTER ONLY)
Abs Immature Granulocytes: 0.03 K/uL (ref 0.00–0.07)
Basophils Absolute: 0 K/uL (ref 0.0–0.1)
Basophils Relative: 1 %
Eosinophils Absolute: 0.1 K/uL (ref 0.0–0.5)
Eosinophils Relative: 3 %
HCT: 37.7 % — ABNORMAL LOW (ref 39.0–52.0)
Hemoglobin: 12.2 g/dL — ABNORMAL LOW (ref 13.0–17.0)
Immature Granulocytes: 1 %
Lymphocytes Relative: 14 %
Lymphs Abs: 0.5 K/uL — ABNORMAL LOW (ref 0.7–4.0)
MCH: 29.6 pg (ref 26.0–34.0)
MCHC: 32.4 g/dL (ref 30.0–36.0)
MCV: 91.5 fL (ref 80.0–100.0)
Monocytes Absolute: 0.7 K/uL (ref 0.1–1.0)
Monocytes Relative: 21 %
Neutro Abs: 2.2 K/uL (ref 1.7–7.7)
Neutrophils Relative %: 60 %
Platelet Count: 123 K/uL — ABNORMAL LOW (ref 150–400)
RBC: 4.12 MIL/uL — ABNORMAL LOW (ref 4.22–5.81)
RDW: 16.2 % — ABNORMAL HIGH (ref 11.5–15.5)
WBC Count: 3.6 K/uL — ABNORMAL LOW (ref 4.0–10.5)
nRBC: 0 % (ref 0.0–0.2)

## 2024-03-06 DIAGNOSIS — E1165 Type 2 diabetes mellitus with hyperglycemia: Secondary | ICD-10-CM | POA: Diagnosis not present

## 2024-03-08 ENCOUNTER — Inpatient Hospital Stay: Attending: Internal Medicine

## 2024-03-08 DIAGNOSIS — Z5111 Encounter for antineoplastic chemotherapy: Secondary | ICD-10-CM | POA: Diagnosis present

## 2024-03-08 DIAGNOSIS — C7931 Secondary malignant neoplasm of brain: Secondary | ICD-10-CM | POA: Insufficient documentation

## 2024-03-08 DIAGNOSIS — Z5112 Encounter for antineoplastic immunotherapy: Secondary | ICD-10-CM | POA: Insufficient documentation

## 2024-03-08 DIAGNOSIS — Z923 Personal history of irradiation: Secondary | ICD-10-CM | POA: Insufficient documentation

## 2024-03-08 DIAGNOSIS — N400 Enlarged prostate without lower urinary tract symptoms: Secondary | ICD-10-CM | POA: Diagnosis not present

## 2024-03-08 DIAGNOSIS — Z86718 Personal history of other venous thrombosis and embolism: Secondary | ICD-10-CM | POA: Diagnosis not present

## 2024-03-08 DIAGNOSIS — C3411 Malignant neoplasm of upper lobe, right bronchus or lung: Secondary | ICD-10-CM | POA: Diagnosis present

## 2024-03-08 DIAGNOSIS — Z9221 Personal history of antineoplastic chemotherapy: Secondary | ICD-10-CM | POA: Diagnosis not present

## 2024-03-08 DIAGNOSIS — Z79899 Other long term (current) drug therapy: Secondary | ICD-10-CM | POA: Insufficient documentation

## 2024-03-08 DIAGNOSIS — E039 Hypothyroidism, unspecified: Secondary | ICD-10-CM | POA: Diagnosis not present

## 2024-03-08 DIAGNOSIS — Z79890 Hormone replacement therapy (postmenopausal): Secondary | ICD-10-CM | POA: Insufficient documentation

## 2024-03-08 DIAGNOSIS — C3491 Malignant neoplasm of unspecified part of right bronchus or lung: Secondary | ICD-10-CM

## 2024-03-08 LAB — CMP (CANCER CENTER ONLY)
ALT: 18 U/L (ref 0–44)
AST: 21 U/L (ref 15–41)
Albumin: 3.8 g/dL (ref 3.5–5.0)
Alkaline Phosphatase: 107 U/L (ref 38–126)
Anion gap: 8 (ref 5–15)
BUN: 7 mg/dL (ref 6–20)
CO2: 31 mmol/L (ref 22–32)
Calcium: 9.1 mg/dL (ref 8.9–10.3)
Chloride: 98 mmol/L (ref 98–111)
Creatinine: 0.37 mg/dL — ABNORMAL LOW (ref 0.61–1.24)
GFR, Estimated: >60 mL/min (ref >60)
Glucose, Bld: 98 mg/dL (ref 70–99)
Potassium: 3.8 mmol/L (ref 3.5–5.1)
Sodium: 137 mmol/L (ref 135–145)
Total Bilirubin: 0.3 mg/dL (ref 0.0–1.2)
Total Protein: 6.9 g/dL (ref 6.5–8.1)

## 2024-03-08 LAB — CBC WITH DIFFERENTIAL (CANCER CENTER ONLY)
Abs Immature Granulocytes: 0.02 K/uL (ref 0.00–0.07)
Basophils Absolute: 0 K/uL (ref 0.0–0.1)
Basophils Relative: 0 %
Eosinophils Absolute: 0.1 K/uL (ref 0.0–0.5)
Eosinophils Relative: 1 %
HCT: 33.2 % — ABNORMAL LOW (ref 39.0–52.0)
Hemoglobin: 11.1 g/dL — ABNORMAL LOW (ref 13.0–17.0)
Immature Granulocytes: 0 %
Lymphocytes Relative: 8 %
Lymphs Abs: 0.6 K/uL — ABNORMAL LOW (ref 0.7–4.0)
MCH: 29.8 pg (ref 26.0–34.0)
MCHC: 33.4 g/dL (ref 30.0–36.0)
MCV: 89.2 fL (ref 80.0–100.0)
Monocytes Absolute: 1.7 K/uL — ABNORMAL HIGH (ref 0.1–1.0)
Monocytes Relative: 23 %
Neutro Abs: 5 K/uL (ref 1.7–7.7)
Neutrophils Relative %: 68 %
Platelet Count: 134 K/uL — ABNORMAL LOW (ref 150–400)
RBC: 3.72 MIL/uL — ABNORMAL LOW (ref 4.22–5.81)
RDW: 16.5 % — ABNORMAL HIGH (ref 11.5–15.5)
WBC Count: 7.4 K/uL (ref 4.0–10.5)
nRBC: 0 % (ref 0.0–0.2)

## 2024-03-09 ENCOUNTER — Other Ambulatory Visit: Payer: Self-pay

## 2024-03-09 ENCOUNTER — Other Ambulatory Visit: Payer: Self-pay | Admitting: *Deleted

## 2024-03-09 ENCOUNTER — Encounter: Payer: Self-pay | Admitting: Internal Medicine

## 2024-03-09 DIAGNOSIS — C3491 Malignant neoplasm of unspecified part of right bronchus or lung: Secondary | ICD-10-CM

## 2024-03-10 ENCOUNTER — Ambulatory Visit (HOSPITAL_COMMUNITY)
Admission: RE | Admit: 2024-03-10 | Discharge: 2024-03-10 | Disposition: A | Discharge status: Home / Self Care | Source: Ambulatory Visit | Attending: Radiology | Admitting: Radiology

## 2024-03-10 ENCOUNTER — Other Ambulatory Visit (HOSPITAL_COMMUNITY): Payer: Self-pay | Admitting: Radiology

## 2024-03-10 ENCOUNTER — Ambulatory Visit (HOSPITAL_COMMUNITY)
Admission: RE | Admit: 2024-03-10 | Discharge: 2024-03-10 | Disposition: A | Discharge status: Home / Self Care | Source: Ambulatory Visit | Attending: Internal Medicine | Admitting: Internal Medicine

## 2024-03-10 ENCOUNTER — Telehealth: Payer: Self-pay

## 2024-03-10 VITALS — BP 117/88

## 2024-03-10 DIAGNOSIS — J90 Pleural effusion, not elsewhere classified: Secondary | ICD-10-CM | POA: Diagnosis not present

## 2024-03-10 DIAGNOSIS — Z48813 Encounter for surgical aftercare following surgery on the respiratory system: Secondary | ICD-10-CM | POA: Diagnosis not present

## 2024-03-10 DIAGNOSIS — C3491 Malignant neoplasm of unspecified part of right bronchus or lung: Secondary | ICD-10-CM | POA: Diagnosis not present

## 2024-03-10 DIAGNOSIS — Z9889 Other specified postprocedural states: Secondary | ICD-10-CM

## 2024-03-10 MED ORDER — LIDOCAINE-EPINEPHRINE (PF) 2 %-1:200000 IJ SOLN
INTRAMUSCULAR | Status: AC
  Filled 2024-03-10: qty 20

## 2024-03-10 NOTE — Procedures (Signed)
Ultrasound-guided  therapeutic right  thoracentesis performed yielding 600 cc  of yellow  fluid. No immediate complications. Follow-up chest x-ray pending.  EBL none.

## 2024-03-10 NOTE — Telephone Encounter (Signed)
 Returned Engineer, technical sales and spoke with the patient's wife, Nathanel. She reported that the patient is experiencing shortness of breath and is requesting a thoracentesis.  Contacted Celeste in Ultrasound; an appointment is available today at [facility] at 11:30 AM. Coordinated with Centralized Scheduling and scheduled the appointment with Curt[facility]. Patient has been notified and is aware of the scheduled time.

## 2024-03-12 ENCOUNTER — Other Ambulatory Visit: Payer: Self-pay | Admitting: Physician Assistant

## 2024-03-12 DIAGNOSIS — C3491 Malignant neoplasm of unspecified part of right bronchus or lung: Secondary | ICD-10-CM

## 2024-03-15 ENCOUNTER — Inpatient Hospital Stay: Admitting: Internal Medicine

## 2024-03-15 ENCOUNTER — Inpatient Hospital Stay

## 2024-03-15 VITALS — HR 108

## 2024-03-15 VITALS — BP 124/83 | HR 110 | Temp 98.6°F | Resp 16 | Ht 68.0 in | Wt 123.0 lb

## 2024-03-15 DIAGNOSIS — Z923 Personal history of irradiation: Secondary | ICD-10-CM | POA: Diagnosis not present

## 2024-03-15 DIAGNOSIS — N400 Enlarged prostate without lower urinary tract symptoms: Secondary | ICD-10-CM | POA: Diagnosis not present

## 2024-03-15 DIAGNOSIS — Z86718 Personal history of other venous thrombosis and embolism: Secondary | ICD-10-CM | POA: Diagnosis not present

## 2024-03-15 DIAGNOSIS — C7931 Secondary malignant neoplasm of brain: Secondary | ICD-10-CM | POA: Diagnosis not present

## 2024-03-15 DIAGNOSIS — Z79899 Other long term (current) drug therapy: Secondary | ICD-10-CM | POA: Diagnosis not present

## 2024-03-15 DIAGNOSIS — Z79890 Hormone replacement therapy (postmenopausal): Secondary | ICD-10-CM | POA: Diagnosis not present

## 2024-03-15 DIAGNOSIS — C3491 Malignant neoplasm of unspecified part of right bronchus or lung: Secondary | ICD-10-CM

## 2024-03-15 DIAGNOSIS — Z5112 Encounter for antineoplastic immunotherapy: Secondary | ICD-10-CM | POA: Diagnosis not present

## 2024-03-15 DIAGNOSIS — C3411 Malignant neoplasm of upper lobe, right bronchus or lung: Secondary | ICD-10-CM | POA: Diagnosis not present

## 2024-03-15 DIAGNOSIS — Z9221 Personal history of antineoplastic chemotherapy: Secondary | ICD-10-CM | POA: Diagnosis not present

## 2024-03-15 DIAGNOSIS — E039 Hypothyroidism, unspecified: Secondary | ICD-10-CM | POA: Diagnosis not present

## 2024-03-15 DIAGNOSIS — Z5111 Encounter for antineoplastic chemotherapy: Secondary | ICD-10-CM | POA: Diagnosis not present

## 2024-03-15 LAB — CBC WITH DIFFERENTIAL (CANCER CENTER ONLY)
Abs Immature Granulocytes: 0.05 K/uL (ref 0.00–0.07)
Basophils Absolute: 0 K/uL (ref 0.0–0.1)
Basophils Relative: 0 %
Eosinophils Absolute: 0 K/uL (ref 0.0–0.5)
Eosinophils Relative: 0 %
HCT: 37.2 % — ABNORMAL LOW (ref 39.0–52.0)
Hemoglobin: 12 g/dL — ABNORMAL LOW (ref 13.0–17.0)
Immature Granulocytes: 1 %
Lymphocytes Relative: 5 %
Lymphs Abs: 0.5 K/uL — ABNORMAL LOW (ref 0.7–4.0)
MCH: 29.4 pg (ref 26.0–34.0)
MCHC: 32.3 g/dL (ref 30.0–36.0)
MCV: 91.2 fL (ref 80.0–100.0)
Monocytes Absolute: 2.7 K/uL — ABNORMAL HIGH (ref 0.1–1.0)
Monocytes Relative: 26 %
Neutro Abs: 7 K/uL (ref 1.7–7.7)
Neutrophils Relative %: 68 %
Platelet Count: 297 K/uL (ref 150–400)
RBC: 4.08 MIL/uL — ABNORMAL LOW (ref 4.22–5.81)
RDW: 17.1 % — ABNORMAL HIGH (ref 11.5–15.5)
WBC Count: 10.3 K/uL (ref 4.0–10.5)
nRBC: 0 % (ref 0.0–0.2)

## 2024-03-15 LAB — CMP (CANCER CENTER ONLY)
ALT: 15 U/L (ref 0–44)
AST: 14 U/L — ABNORMAL LOW (ref 15–41)
Albumin: 3.7 g/dL (ref 3.5–5.0)
Alkaline Phosphatase: 108 U/L (ref 38–126)
Anion gap: 8 (ref 5–15)
BUN: 10 mg/dL (ref 6–20)
CO2: 32 mmol/L (ref 22–32)
Calcium: 9.6 mg/dL (ref 8.9–10.3)
Chloride: 99 mmol/L (ref 98–111)
Creatinine: 0.43 mg/dL — ABNORMAL LOW (ref 0.61–1.24)
GFR, Estimated: >60 mL/min (ref >60)
Glucose, Bld: 133 mg/dL — ABNORMAL HIGH (ref 70–99)
Potassium: 3.9 mmol/L (ref 3.5–5.1)
Sodium: 139 mmol/L (ref 135–145)
Total Bilirubin: 0.3 mg/dL (ref 0.0–1.2)
Total Protein: 6.9 g/dL (ref 6.5–8.1)

## 2024-03-15 MED ORDER — FAMOTIDINE IN NACL 20-0.9 MG/50ML-% IV SOLN
20.0000 mg | Freq: Once | INTRAVENOUS | Status: AC
  Administered 2024-03-15 (×2): 20 mg via INTRAVENOUS
  Filled 2024-03-15: qty 50

## 2024-03-15 MED ORDER — PALONOSETRON HCL INJECTION 0.25 MG/5ML
0.2500 mg | Freq: Once | INTRAVENOUS | Status: AC
  Administered 2024-03-15 (×2): 0.25 mg via INTRAVENOUS
  Filled 2024-03-15: qty 5

## 2024-03-15 MED ORDER — SODIUM CHLORIDE 0.9 % IV SOLN
546.0000 mg | Freq: Once | INTRAVENOUS | Status: AC
  Administered 2024-03-15 (×2): 550 mg via INTRAVENOUS
  Filled 2024-03-15: qty 55

## 2024-03-15 MED ORDER — SODIUM CHLORIDE 0.9 % IV SOLN
15.0000 mg/kg | Freq: Once | INTRAVENOUS | Status: AC
  Administered 2024-03-15 (×2): 900 mg via INTRAVENOUS
  Filled 2024-03-15: qty 4

## 2024-03-15 MED ORDER — SODIUM CHLORIDE 0.9 % IV SOLN
500.0000 mg/m2 | Freq: Once | INTRAVENOUS | Status: AC
  Administered 2024-03-15 (×2): 900 mg via INTRAVENOUS
  Filled 2024-03-15: qty 20

## 2024-03-15 MED ORDER — DEXAMETHASONE SODIUM PHOSPHATE 10 MG/ML IJ SOLN
10.0000 mg | Freq: Once | INTRAMUSCULAR | Status: AC
  Administered 2024-03-15 (×2): 10 mg via INTRAVENOUS
  Filled 2024-03-15: qty 1

## 2024-03-15 MED ORDER — FOSAPREPITANT DIMEGLUMINE INJECTION 150 MG
150.0000 mg | Freq: Once | INTRAVENOUS | Status: AC
  Administered 2024-03-15 (×2): 150 mg via INTRAVENOUS
  Filled 2024-03-15: qty 150

## 2024-03-15 MED ORDER — DIPHENHYDRAMINE HCL 50 MG/ML IJ SOLN
25.0000 mg | Freq: Once | INTRAMUSCULAR | Status: AC
  Administered 2024-03-15 (×2): 25 mg via INTRAVENOUS
  Filled 2024-03-15: qty 1

## 2024-03-15 MED ORDER — SODIUM CHLORIDE 0.9 % IV SOLN
INTRAVENOUS | Status: DC
Start: 2024-03-15 — End: 2024-03-15

## 2024-03-15 NOTE — Progress Notes (Signed)
 [facility] Telephone:([phone]) [phone]   Fax:([phone]  OFFICE PROGRESS NOTE  [name], MD [address] [place] [address]  DIAGNOSIS:  Metastatic non-small cell lung cancer initially diagnosed as stage IIIB (T1c, N3, M0) non-small cell lung cancer, adenocarcinoma presented with right upper lobe lung mass in addition to mediastinal and bilateral supraclavicular lymphadenopathy diagnosed in January 2020.  He had evidence of disease recurrence in June 2021 with adenopathy in the subcarinal, level 3 left neck lymph node, and lymph node at the curious of the diaphragm and in the upper abdomen.   PD-L1: 10%   Guardant 360 molecular studies showed no actionable mutation   Foundation One Testing:   Biomarker Findings Microsatellite status - MS-Stable Tumor Mutational Burden - 4 Muts/Mb Genomic Findings For a complete list of the genes assayed, please refer to the Appendix. ALK EML4-ALK fusion (Variant 2) SMARCB1 R377C CTNNB1 S45P CDKN2A/B CDKN2B loss, CDKN2A loss CXCR4 W106* TP53 splice site 672G>A 7 Disease relevant genes with no reportable alterations: BRAF, EGFR, ERBB2, KRAS, MET, RET, ROS1   PRIOR THERAPY: 1) Concurrent chemoradiation with weekly carboplatin  for AUC of 2 and paclitaxel  45 mg/M2.  Status post 6 cycles.  Last dose was given on October 10, 2018 with stable disease. 2) Radiation treatment to the enlarging right cervical lymph nodes under the care of Dr. Shannon. First treatment 03/06/2019. Last treatment scheduled on 04/13/2019 3) Consolidation treatment with immunotherapy with Imfinzi  10 mg/KG every 2 weeks.  First dose November 17, 2018.  Status post 26 cycles. 4) Systemic chemotherapy with carboplatin  for an AUC of 5, Alimta  500 mg/m2, and Keytruda  200 mg IV every 3 weeks. First dose expected on 02/14/20. Status post 1 cycle.  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 5) SBRT to the left lower lobe lung nodule. 6) [name] 600 mg p.o. twice daily.  He started the first dose on March 08, 2020.   Status post 44 months of treatment.  Discontinued in March 2025 due to disease progression 7) [name] 100 mg daily.  Discontinued in April 2025 due to severe intolerance due to mental status changes/confusion  8) Radiation to the bilateral cervical lymph nodes under the care of Dr. Dewey last day expected on 11/17/2023    CURRENT THERAPY: Palliative systemic chemotherapy with carboplatin  for an AUC of 4 (dose reduced), Alimta  500 mg/m (dose reduced), and [name]  IV every 3 weeks.  First dose expected on 12/23/2023.  Status post 4 cycles.  INTERVAL HISTORY: Brian [name] 63 y.o. male returns to the clinic today for follow-up visit accompanied by his wife.  Discussed the use of AI scribe software for clinical note transcription with the patient, who gave verbal consent to proceed.  History of Present Illness Brian [name] is a 63 year old male with metastatic non-small cell lung cancer who presents for follow-up of his treatment.  He has a history of metastatic non-small cell lung cancer, adenocarcinoma subtype, initially diagnosed in January 2020 as stage IIIB with positive ALK gene translocation and PD-L1 expression of 10%. He is currently on his fourth cycle of treatment.  The last cycle of treatment was well-tolerated with no nausea, vomiting, diarrhea, or headaches. His breathing has improved following the drainage of right-sided pleural effusion, with approximately 600 cc of fluid removed. This improvement was noted about a day after the procedure.  He  mentions swelling in his right arm, which he has been managing by wrapping. The dark areas on his arm are bruises from his pets jumping on him, not related to IV access.  No nausea, vomiting, diarrhea, or headaches.    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  MEDICATIONS:  Current Outpatient Medications  Medication Sig Dispense Refill   acetaminophen  (TYLENOL ) 500 MG tablet Take 1,000 mg by mouth every 6 (six) hours as needed (for pain).     albuterol  (VENTOLIN  HFA) 108 (90 Base) MCG/ACT inhaler INHALE 2 PUFFS INTO THE LUNGS EVERY 4 (FOUR) HOURS AS NEEDED FOR WHEEZING OR SHORTNESS OF BREATH. 6.7 each 4   amLODipine  (NORVASC ) 5 MG tablet Take 0.5 tablets (2.5 mg total) by mouth in the morning. 30 tablet 2   atorvastatin  (LIPITOR) 20 MG tablet Take 20 mg by mouth in the morning.     baclofen  (LIORESAL ) 10 MG tablet Take 10 mg by mouth at bedtime.     cyclobenzaprine  (FLEXERIL ) 5 MG tablet TAKE 1 TABLET BY MOUTH THREE TIMES A DAY AS NEEDED FOR MUSCLE SPASMS (Patient not taking: Reported on 02/23/2024) 30 tablet 0   dexamethasone  (DECADRON ) 4 MG tablet Take 1 tablet twice a day the day before, the day of, and the day after chemotherapy. 40 tablet 2   DULoxetine  (CYMBALTA ) 60 MG capsule Take 1 capsule (60 mg total) by mouth daily. 90 capsule 3   fluticasone  (FLONASE ) 50 MCG/ACT nasal spray Place 1 spray into both nostrils daily. (Patient not taking: Reported on 02/23/2024) 1 mL 2   folic acid  (FOLVITE ) 1 MG tablet TAKE 1 TABLET BY MOUTH EVERY DAY 30 tablet 2   gabapentin  (NEURONTIN ) 600 MG tablet  Take 600 mg by mouth.     levothyroxine  (SYNTHROID ) 100 MCG tablet Take 100 mcg by mouth daily before breakfast.     methylPREDNISolone  (MEDROL  DOSEPAK) 4 MG TBPK tablet Use as instructed (Patient not taking: Reported on 02/23/2024) 21 tablet 0   MIRALAX  17 GM/SCOOP powder Take 17 g by mouth daily as needed for mild constipation.     mirtazapine  (REMERON ) 15 MG tablet TAKE 1 TABLET BY MOUTH EVERYDAY AT BEDTIME 90 tablet 1   prochlorperazine  (COMPAZINE ) 10 MG tablet Take 1 tablet (10 mg total) by mouth every 6 (six) hours as needed. 30 tablet 2   TRELEGY ELLIPTA  100-62.5-25 MCG/INH AEPB Inhale 1 puff into the lungs in the morning.     venlafaxine  (EFFEXOR ) 37.5 MG tablet Take 37.5 mg by mouth in the morning.     No current facility-administered medications for this visit.    SURGICAL HISTORY:  Past Surgical History:  Procedure Laterality Date   COLONOSCOPY     FEMUR FRACTURE SURGERY Left 1969  INGUINAL HERNIA REPAIR Right 06/24/2020   Procedure: RIGHT OPEN INGUINAL HERNIA REPAIR WITH MESH;  Surgeon: Kinsinger, Herlene Righter, MD;  Location: [facility];  Service: General;  Laterality: Right;   IR IVC FILTER PLMT / S&I PORTER GUID/MOD SED  03/04/2020   IR IVC FILTER RETRIEVAL / S&I PORTER GUID/MOD SED  08/15/2020   IR RADIOLOGIST EVAL & MGMT  08/06/2020   SPINAL CORD STIMULATOR INSERTION N/A 10/05/2023   Procedure: CERVICAL SPINAL CORD STIMULATOR PLACEMENT;  Surgeon: Claudene Penne ORN, MD;  Location: [facility] ORS;  Service: Neurosurgery;  Laterality: N/A;   THORACENTESIS Right 12/10/2023   Procedure: THORACENTESIS;  Surgeon: Kara Dorn NOVAK, MD;  Location: [facility] ENDOSCOPY;  Service: Pulmonary;  Laterality: Right;    REVIEW OF SYSTEMS:  A comprehensive review of systems was negative except for: Constitutional: positive for fatigue Respiratory: positive for dyspnea on exertion   PHYSICAL EXAMINATION: General appearance: alert, cooperative, fatigued, and no distress Head: Normocephalic, without obvious  abnormality, atraumatic Neck: no adenopathy, no JVD, supple, symmetrical, trachea midline, and thyroid  not enlarged, symmetric, no tenderness/mass/nodules Lymph nodes: Cervical, supraclavicular, and axillary nodes normal. Resp: clear to auscultation bilaterally Back: symmetric, no curvature. ROM normal. No CVA tenderness. Cardio: regular rate and rhythm, S1, S2 normal, no murmur, click, rub or gallop GI: soft, non-tender; bowel sounds normal; no masses,  no organomegaly Extremities: extremities normal, atraumatic, no cyanosis or edema  ECOG PERFORMANCE STATUS: 1 - Symptomatic but completely ambulatory  Blood pressure 124/83, pulse (!) 110, temperature 98.6 F (37 C), temperature source Temporal, resp. rate 16, height 5' 8 (1.727 m), weight 123 lb (55.8 kg), SpO2 99%.  LABORATORY DATA: Lab Results  Component Value Date   WBC 10.3 03/15/2024   HGB 12.0 (L) 03/15/2024   HCT 37.2 (L) 03/15/2024   MCV 91.2 03/15/2024   PLT 297 03/15/2024      Chemistry      Component Value Date/Time   NA 137 03/08/2024 1402   K 3.8 03/08/2024 1402   CL 98 03/08/2024 1402   CO2 31 03/08/2024 1402   BUN 7 03/08/2024 1402   CREATININE 0.37 (L) 03/08/2024 1402      Component Value Date/Time   CALCIUM  9.1 03/08/2024 1402   ALKPHOS 107 03/08/2024 1402   AST 21 03/08/2024 1402   ALT 18 03/08/2024 1402   BILITOT 0.3 03/08/2024 1402       RADIOGRAPHIC STUDIES: US  THORACENTESIS ASP PLEURAL SPACE W/IMG GUIDE Result Date: 03/10/2024 INDICATION: Patient with history of metastatic right lung adenocarcinoma, dyspnea, recurrent right pleural effusion. Request received for therapeutic right thoracentesis. EXAM: ULTRASOUND GUIDED THERAPEUTIC RIGHT THORACENTESIS MEDICATIONS: 8 mL 1% lidocaine  with epinephrine  COMPLICATIONS: None immediate. PROCEDURE: An ultrasound guided thoracentesis was thoroughly discussed with the patient and questions answered. The benefits, risks, alternatives and complications were also  discussed. The patient understands and wishes to proceed with the procedure. Written consent was obtained. Ultrasound was performed to localize and mark an adequate pocket of fluid in the right chest. The area was then prepped and draped in the normal sterile fashion. 1% Lidocaine  was used for local anesthesia. Under ultrasound guidance a 6 Fr Safe-T-Centesis catheter was introduced. Thoracentesis was performed. The catheter was removed and a dressing applied. FINDINGS: A total of approximately 600 cc of yellow fluid was removed. IMPRESSION: Successful ultrasound guided therapeutic right thoracentesis yielding 600 cc of pleural fluid. Performed by: Franky Rakers, PA-C Electronically Signed   By: Cordella Banner   On: 03/10/2024 12:05   DG Chest 1  View Result Date: 03/10/2024 CLINICAL DATA:  Post right-sided thoracentesis. EXAM: CHEST  1 VIEW COMPARISON:  01/05/2024 and CT 02/16/2024 FINDINGS: Lungs are adequately inflated. Left lung is clear. Mild interval improvement in patient's right pleural effusion post thoracentesis with small amount residual right pleural fluid present. No pneumothorax. Improved right hilar density. Cardiomediastinal silhouette and remainder of the exam is unchanged including subluxed right humeral head. IMPRESSION: 1. Mild interval improvement in patient's right pleural effusion post thoracentesis with small amount of residual right pleural fluid. No pneumothorax. 2. Improved right hilar density. Electronically Signed   By: Toribio Agreste M.D.   On: 03/10/2024 12:01   CT CHEST ABDOMEN PELVIS W CONTRAST Result Date: 02/22/2024 CLINICAL DATA:  Non-small-cell lung cancer. * Tracking Code: BO *. EXAM: CT CHEST, ABDOMEN, AND PELVIS WITH CONTRAST TECHNIQUE: Multidetector CT imaging of the chest, abdomen and pelvis was performed following the standard protocol during bolus administration of intravenous contrast. RADIATION DOSE REDUCTION: This exam was performed according to the departmental  dose-optimization program which includes automated exposure control, adjustment of the mA and/or kV according to patient size and/or use of iterative reconstruction technique. CONTRAST:  80mL OMNIPAQUE  IOHEXOL  300 MG/ML  SOLN COMPARISON:  CT chest abdomen pelvis 08/13/2023.  PET-CT 10/22/2023. FINDINGS: CT CHEST FINDINGS Cardiovascular: Heart is nonenlarged. Trace pericardial fluid. Mild coronary artery calcifications. Please correlate for other coronary risk factors. Thoracic aorta has a normal course and caliber with mild atherosclerotic plaque. Mediastinum/Nodes: Slightly patulous thoracic esophagus. There is wall thickening along the course of the esophagus including at the level of the carina. This area was hypermetabolic on the prior examination. Example area on image 22 of series 2 measures 2.2 by 2.0 cm. Please correlate with workup in symptoms. This lesion could be a node adjacent to the esophagus versus involving the wall of the esophagus. Please correlate with symptoms. On the prior PET-CT there are several hypermetabolic nodes and soft tissue lesions along the neck and supraclavicular areas. Please see separate neck CT scan from same day. Today there is no specific abnormal lymph node enlargement identified in the axillary region or hila. Lungs/Pleura: Right-sided pleural effusion has more loculations today. More fluid tracking along the course of the major fissure. Overall size of the effusion is slightly smaller. Significant residual, least moderate. Adjacent patchy parenchymal opacities identified. Tiny left effusion. Mild breathing motion. No pneumothorax. No new dominant lung mass. There is some tiny nodules. Example left upper lobe on series 4, image 43 measuring 2-3 mm. This is not clearly seen on the CT scan of January 2025. Similar focus left lower lobe medially measuring 4 mm on series 4, image 84. Again not seen previously. Recommend simple follow up surveillance for the patient's neoplasm.  Musculoskeletal: Scattered degenerative changes. CT ABDOMEN PELVIS FINDINGS Hepatobiliary: No focal liver abnormality is seen. No gallstones, gallbladder wall thickening, or biliary dilatation. Pancreas: Mild parenchymal atrophy globally.  No obvious mass. Spleen: Normal in size without focal abnormality. Adrenals/Urinary Tract: Adrenal glands are preserved. Bosniak 1 upper pole left-sided renal cyst is stable. No enhancing renal mass or collecting system dilatation. The ureters have normal course and caliber extending down to the urinary bladder. Slight wall thickening of the urinary bladder. Nonspecific. Stomach/Bowel: Breathing motion. The large bowel has a normal caliber. Diffuse colonic stool. Redundant course of the sigmoid colon extending into the mid abdomen. Stomach and small bowel are nondilated. Normal appendix. Vascular/Lymphatic: Aortic atherosclerosis. No enlarged abdominal or pelvic lymph nodes. Reproductive: Large prostate. Please correlate with the  patient's PSA. Other: Anasarca.  No definite free air or free fluid. Musculoskeletal: Scattered degenerative changes identified. Slight curvature of the spine. Battery pack seen posterior along the left side of the abdomen in CBT knee is tissues with lead extending to the cervical region. Please correlate with the patient's neck CT. IMPRESSION: Persistent mass corresponding the area of abnormal uptake along the course of the midthoracic esophagus. This could be esophageal versus an adjacent node. Please correlate with symptoms and workup. Abnormal soft tissue along the thoracic inlet is better described on the prior neck CT scan. More complex appearing right-sided pleural effusion with loculations and fluid tracking along the major fissure. The amount of pleural fluid is slightly decreased from previous examination. Tiny left effusion. Few tiny lung nodules identified which are new but under 5 mm. Recommend attention on follow-up. Enlarged prostate.  Please correlate with patient's PSA. No bowel obstruction. Electronically Signed   By: Ranell Bring M.D.   On: 02/22/2024 12:18   CT Soft Tissue Neck W Contrast Result Date: 02/17/2024 CLINICAL DATA:  Neck mass, history of lung cancer EXAM: CT NECK WITH CONTRAST TECHNIQUE: Multidetector CT imaging of the neck was performed using the standard protocol following the bolus administration of intravenous contrast. RADIATION DOSE REDUCTION: This exam was performed according to the departmental dose-optimization program which includes automated exposure control, adjustment of the mA and/or kV according to patient size and/or use of iterative reconstruction technique. CONTRAST:  80mL OMNIPAQUE  IOHEXOL  300 MG/ML  SOLN COMPARISON:  September 27, 2023 FINDINGS: There is a mass at the base of the neck which involves the anterior scalene and encases the right subclavian artery. The mass is difficult to measure as it is difficult to separate from the anterior scalene, but appears smaller than on the prior study. The left supraclavicular lymphadenopathy and left base of neck adenopathy noted on the prior study has improved. Pharynx: The nasopharynx, oropharynx and hypopharynx are normal Oral cavity/floor of mouth: Normal Larynx: Right vocal cord paralysis Salivary glands: The parotid and submandibular glands are normal Thyroid : Atrophic, otherwise normal. Vascular: No significant abnormality Limited intracranial: No significant abnormality Visualized orbits: No significant abnormality Mastoids and visualized paranasal sinuses: Right ethmoid sinus disease Skeleton: There is a cervical spinal stimulator in place Upper chest: There is a large right pleural effusion. Refer to chest CT report. Other: None IMPRESSION: 1. Ill-defined mass at the base of the neck on the right inseparable from the anterior scalene muscle and encasing the right subclavian artery is smaller than on the prior study 2. Right vocal cord paralysis unchanged 3.  Left cervical and supraclavicular adenopathy is improved Electronically Signed   By: Nancyann Burns M.D.   On: 02/17/2024 11:21    ASSESSMENT AND PLAN: This is a very pleasant 63 years old white male with metastatic non-small cell lung cancer, adenocarcinoma with positive ALK gene translocation that was initially diagnosed as a stage IIIb in January 2020 with evidence for disease recurrence and metastasis in June 2021. He was initially treated with a course of concurrent chemoradiation followed by consolidation treatment with immunotherapy and then systemic chemotherapy with 1 cycle of carboplatin , Alimta  and Keytruda  every 4 his tumor was found to have ALK gene translocation. At that time he was switched to treatment with alectinib since August 2021 status post 44 months of treatment discontinued in March 2025 secondary to disease progression. The patient started treatment with [name]  100 mg p.o. daily but he was unable to take it more than few weeks  and this discontinued secondary to significant toxicity with mental status change and confusion. Is here today for evaluation and discussion of other treatment options.  He is currently on treatment with systemic chemotherapy with carboplatin  for AUC of 4 and Alimta  400 Mg/M2 in addition to [name]  15 Mg/KG every 3 weeks.  Status post 4 cycles. He has been tolerating the treatment fairly well. Assessment and Plan Assessment & Plan Metastatic non-small cell lung adenocarcinoma, ALK-positive Initially diagnosed in January 2020 as stage III B. Currently undergoing treatment with a combined regimen. No reported side effects such as nausea, vomiting, diarrhea, or headaches from the last cycle. Blood work is stable, allowing continuation of treatment. - Proceed with cycle number five of the current treatment regimen. - Plan to see him back in three weeks for the last cycle of the combined treatment. - Consider dropping carboplatin  and continuing with Alimta  and  [name]  after the last cycle.  Malignant right-sided pleural effusion, status post drainage Right-sided pleural effusion previously drained, resulting in improved breathing. Approximately 600 cc of fluid was removed.  Right arm swelling and bruising, under observation Right arm swelling and bruising not attributed to IV access. Bruising is from external trauma, not suggestive of infection at this time. - Monitor the right arm for any signs of infection or worsening symptoms. - Reach out to the healthcare team or family doctor if the condition worsens. The patient was advised to call immediately if he has any concerning symptoms in the interval.  The patient voices understanding of current disease status and treatment options and is in agreement with the current care plan.  All questions were answered. The patient knows to call the clinic with any problems, questions or concerns. We can certainly see the patient much sooner if necessary.  The total time spent in the appointment was 20 minutes including review of chart and various tests results, discussions about plan of care and coordination of care plan .   Disclaimer: This note was dictated with voice recognition software. Similar sounding words can inadvertently be transcribed and may not be corrected upon review.

## 2024-03-15 NOTE — Patient Instructions (Signed)
 [facility] - A DEPT OF [facility]  Discharge Instructions: Thank you for choosing [facility] to provide your oncology and hematology care.   If you have a lab appointment with the Cancer Center, please go directly to the Cancer Center and check in at the registration area.   Wear comfortable clothing and clothing appropriate for easy access to any Portacath or PICC line.   We strive to give you quality time with your provider. You may need to reschedule your appointment if you arrive late (15 or more minutes).  Arriving late affects you and other patients whose appointments are after yours.  Also, if you miss three or more appointments without notifying the office, you may be dismissed from the clinic at the provider's discretion.      For prescription refill requests, have your pharmacy contact our office and allow 72 hours for refills to be completed.    Today you received the following chemotherapy and/or immunotherapy agents mvasi , alimta, carboplatin       To help prevent nausea and vomiting after your treatment, we encourage you to take your nausea medication as directed.  BELOW ARE SYMPTOMS THAT SHOULD BE REPORTED IMMEDIATELY: *FEVER GREATER THAN 100.4 F (38 C) OR HIGHER *CHILLS OR SWEATING *NAUSEA AND VOMITING THAT IS NOT CONTROLLED WITH YOUR NAUSEA MEDICATION *UNUSUAL SHORTNESS OF BREATH *UNUSUAL BRUISING OR BLEEDING *URINARY PROBLEMS (pain or burning when urinating, or frequent urination) *BOWEL PROBLEMS (unusual diarrhea, constipation, pain near the anus) TENDERNESS IN MOUTH AND THROAT WITH OR WITHOUT PRESENCE OF ULCERS (sore throat, sores in mouth, or a toothache) UNUSUAL RASH, SWELLING OR PAIN  UNUSUAL VAGINAL DISCHARGE OR ITCHING   Items with * indicate a potential emergency and should be followed up as soon as possible or go to the Emergency Department if any problems should occur.  Please show the CHEMOTHERAPY ALERT CARD  or IMMUNOTHERAPY ALERT CARD at check-in to the Emergency Department and triage nurse.  Should you have questions after your visit or need to cancel or reschedule your appointment, please contact [facility] - A DEPT OF [facility]  Dept: [phone]  and follow the prompts.  Office hours are 8:00 a.m. to 4:30 p.m. Monday - Friday. Please note that voicemails left after 4:00 p.m. may not be returned until the following business day.  We are closed weekends and major holidays. You have access to a nurse at all times for urgent questions. Please call the main number to the clinic Dept: [phone] and follow the prompts.   For any non-urgent questions, you may also contact your provider using MyChart. We now offer e-Visits for anyone 66 and older to request care online for non-urgent symptoms. For details visit [email or web address].   Also download the MyChart app! Go to the app store, search "MyChart", open the app, select [facility], and log in with your MyChart username and password.

## 2024-03-18 ENCOUNTER — Other Ambulatory Visit: Payer: Self-pay | Admitting: Physician Assistant

## 2024-03-24 DIAGNOSIS — J189 Pneumonia, unspecified organism: Secondary | ICD-10-CM | POA: Diagnosis not present

## 2024-03-29 ENCOUNTER — Other Ambulatory Visit: Payer: Self-pay | Admitting: Physician Assistant

## 2024-03-30 NOTE — Progress Notes (Signed)
 [facility] OFFICE PROGRESS NOTE  [name], MD [address] [place] [address]  DIAGNOSIS: Metastatic non-small cell lung cancer initially diagnosed as stage IIIB (T1c, N3, M0) non-small cell lung cancer, adenocarcinoma presented with right upper lobe lung mass in addition to mediastinal and bilateral supraclavicular lymphadenopathy diagnosed in January 2020.  He had evidence of disease recurrence in June 2021 with adenopathy in the subcarinal, level 3 left neck lymph node, and lymph node at the curious of the diaphragm and in the upper abdomen.   PD-L1: 10%   Guardant 360 molecular studies showed no actionable mutation   Foundation One Testing:   Biomarker Findings Microsatellite status - MS-Stable Tumor Mutational Burden - 4 Muts/Mb Genomic Findings For a complete list of the genes assayed, please refer to the Appendix. ALK EML4-ALK fusion (Variant 2) SMARCB1 R377C CTNNB1 S45P CDKN2A/B CDKN2B loss, CDKN2A loss CXCR4 W106* TP53 splice site 672G>A 7 Disease relevant genes with no reportable alterations: BRAF, EGFR, ERBB2, KRAS, MET, RET, ROS1  PRIOR THERAPY: 1) Concurrent chemoradiation with weekly carboplatin  for AUC of 2 and paclitaxel  45 mg/M2.  Status post 6 cycles.  Last dose was given on October 10, 2018 with stable disease. 2) Radiation treatment to the enlarging right cervical lymph nodes under the care of Dr. Shannon. First treatment 03/06/2019. Last treatment scheduled on 04/13/2019 3) Consolidation treatment with immunotherapy with Imfinzi  10 mg/KG every 2 weeks.  First dose November 17, 2018.  Status post 26 cycles. 4) Systemic chemotherapy with carboplatin  for an AUC of 5, Alimta  500 mg/m2, and Keytruda  200 mg IV every 3 weeks. First dose expected on 02/14/20. Status post 1 cycle.  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 5) SBRT to the left lower lobe lung  nodule. 6) [name] 600 mg p.o. twice daily.  He started the first dose on March 08, 2020.   Status post 44 months of treatment.  Discontinued in March 2025 due to disease progression 7) [name] 100 mg daily.  Discontinued in April 2025 due to severe intolerance due to mental status changes/confusion  8) Radiation to the bilateral cervical lymph nodes under the care of Dr. Dewey last day expected on 11/17/2023   CURRENT THERAPY: Palliative systemic chemotherapy with carboplatin  for an AUC of 4 (dose reduced), Alimta  400 mg/m (dose reduced), and [name]  IV every 3 weeks.  First dose expected on 12/23/2023. His dose will be increased to carboplatin  for an AUC of 5 and Alimta  500 mg starting from cycle #2. He is status post 5 cycles.   INTERVAL HISTORY: Brian [name] 63 y.o. male returns to the clinic today for a follow-up visit accompanied by his wife.  The patient was last seen in clinic on 03/15/2024 by Dr. Sherrod.  The patient is currently undergoing chemotherapy.  He is currently on maintenance Alimta .  He has been experiencing consistently high pulse rates, particularly during chemotherapy sessions. He does not have a cardiologist and has not had an EKG in a long time. Despite this, his last chemotherapy treatment was uneventful, and he has not experienced any fevers or infections recently. He denies history of arrhythmia. He thinks he is hydrating well at home.   His weight is stable with a slight decrease of a few ounces. His appetite has improved, but he acknowledges the need to eat more. A few weeks ago, 600 mL of fluid was drawn from  his lung, which resulted in an improvement in his symptoms. He continues to use oxygen  as needed. No chest discomfort, hemoptysis, or frequent coughing.  He experiences some constipation and is taking Miralax , which he finds helpful. No nausea, vomiting, diarrhea, or abnormal bleeding, although he notes having thin skin and bruising  easily.  No rashes, unusual headaches, or significant vision changes, though he mentions difficulty reading small print and uses reading glasses, which can cause headaches due to differing prescriptions for each eye. He states he is due for an eye exam.   He is here today for evaluation of repeat blood work before undergoing cycle #6.  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  MEDICATIONS:  Current Outpatient Medications  Medication Sig Dispense Refill   acetaminophen  (TYLENOL ) 500 MG tablet Take 1,000 mg by mouth every 6 (six) hours as needed (for pain).     albuterol  (VENTOLIN  HFA) 108 (90 Base) MCG/ACT inhaler INHALE 2 PUFFS INTO THE LUNGS EVERY 4 (FOUR) HOURS AS NEEDED FOR WHEEZING OR SHORTNESS OF BREATH. 6.7 each 4   amLODipine  (NORVASC ) 5 MG tablet Take 0.5 tablets (2.5 mg total) by mouth in the morning. 30 tablet 2   atorvastatin  (LIPITOR) 20 MG tablet Take 20 mg by mouth in the morning.     cyclobenzaprine  (FLEXERIL ) 5 MG tablet TAKE 1 TABLET BY MOUTH THREE TIMES A DAY AS NEEDED FOR MUSCLE SPASMS 30 tablet 0   dexamethasone  (DECADRON ) 4 MG tablet Take 1 tablet twice a day the day before, the day of, and the day after chemotherapy. 40 tablet 2   DULoxetine  (CYMBALTA ) 60 MG capsule Take 1 capsule (60 mg total) by  mouth daily. 90 capsule 3   fluticasone  (FLONASE ) 50 MCG/ACT nasal spray Place 1 spray into both nostrils daily. (Patient not taking: Reported on 02/23/2024) 1 mL 2   folic acid  (FOLVITE ) 1 MG tablet TAKE 1 TABLET BY MOUTH EVERY DAY 30 tablet 2   gabapentin  (NEURONTIN ) 600 MG tablet Take 600 mg by mouth.     levothyroxine  (SYNTHROID ) 100 MCG tablet Take 100 mcg by mouth daily before breakfast.     methylPREDNISolone  (MEDROL  DOSEPAK) 4 MG TBPK tablet Use as instructed (Patient not taking: Reported on 02/23/2024) 21 tablet 0   MIRALAX  17 GM/SCOOP powder Take 17 g by mouth daily as needed for mild constipation.     mirtazapine  (REMERON ) 15 MG tablet TAKE 1 TABLET BY MOUTH EVERYDAY AT BEDTIME 90 tablet 1   prochlorperazine  (COMPAZINE ) 10 MG tablet Take 1 tablet (10 mg total) by mouth every 6 (six) hours as needed. 30 tablet 2   TRELEGY ELLIPTA  100-62.5-25 MCG/ACT AEPB INHALE 1 PUFF BY MOUTH ONCE DAILY 180 each 3   venlafaxine  (EFFEXOR ) 37.5 MG tablet  Take 37.5 mg by mouth in the morning.     Current Facility-Administered Medications  Medication Dose Route Frequency Provider Last Rate Last Admin   0.9 %  sodium chloride  infusion   Intravenous Once Josepha Barbier L, PA-C        SURGICAL HISTORY:  Past Surgical History:  Procedure Laterality Date   COLONOSCOPY     FEMUR FRACTURE SURGERY Left 1969   INGUINAL HERNIA REPAIR Right 06/24/2020   Procedure: RIGHT OPEN INGUINAL HERNIA REPAIR WITH MESH;  Surgeon: Kinsinger, Herlene Righter, MD;  Location: [facility] ORS;  Service: General;  Laterality: Right;   IR IVC FILTER PLMT / S&I PORTER GUID/MOD SED  03/04/2020   IR IVC FILTER RETRIEVAL / S&I PORTER GUID/MOD SED  08/15/2020   IR RADIOLOGIST EVAL & MGMT  08/06/2020   SPINAL CORD STIMULATOR INSERTION N/A 10/05/2023   Procedure: CERVICAL SPINAL CORD STIMULATOR PLACEMENT;  Surgeon: Claudene Penne ORN, MD;  Location: [facility] ORS;  Service: Neurosurgery;  Laterality: N/A;   THORACENTESIS Right 12/10/2023   Procedure:  THORACENTESIS;  Surgeon: Kara Dorn NOVAK, MD;  Location: [facility] ENDOSCOPY;  Service: Pulmonary;  Laterality: Right;    REVIEW OF SYSTEMS:   Constitutional: Stable fatigue. Positive for weight loss. Improved appetite. Negative for chills, fever and unexpected weight change.  HENT: Positive for stable hoarseness. Negative for mouth sores, nosebleeds, sore throat and trouble swallowing.   Eyes: Negative for eye problems and icterus.  Respiratory: Positive for intermittent dyspnea on exertion (better than prior). Negative for cough, hemoptysis, and wheezing.   Cardiovascular: Negative for chest pain and leg swelling.  Gastrointestinal: Positive for mild occasional constipation. Negative for abdominal pain, diarrhea, nausea and vomiting.  Genitourinary: Negative for bladder incontinence, difficulty urinating, dysuria, frequency and hematuria.   Musculoskeletal: Negative for back pain, gait problem, neck pain and neck stiffness.  Skin: Negative for itching and rash.  Neurological: Right upper extremity weakness. Negative for dizziness, gait problem, headaches, light-headedness and seizures.  Hematological: Negative for adenopathy. Does not bleed easily. Positive for easy bruising.  Psychiatric/Behavioral: Negative for confusion, depression and sleep disturbance. The patient is not nervous/anxious.   PHYSICAL EXAMINATION:  Blood pressure (!) 143/97, pulse (!) 119, temperature 98 F (36.7 C), temperature source Temporal, resp. rate 16, weight 122 lb 12.8 oz (55.7 kg), SpO2 97%.  ECOG PERFORMANCE STATUS: 1  Physical Exam  Constitutional: Oriented to person, place, and time and chronically ill appearing male and in no distress.  HENT:  Head: Normocephalic and atraumatic.  Mouth/Throat: Oropharynx is clear and moist. No oropharyngeal exudate.  Eyes: Conjunctivae are normal. Right eye exhibits no discharge. Left eye exhibits no discharge. No scleral icterus.  Neck: Normal range of motion. Neck  supple.  Cardiovascular: Tachycardic, regular rhythm, normal heart sounds and intact distal pulses.   Pulmonary/Chest: Effort normal. No respiratory distress. No wheezes. No rales. On supplemental oxygen .  Abdominal: Soft. Bowel sounds are normal. Exhibits no distension and no mass. There is no tenderness.  Musculoskeletal: Flaccid right upper extremity. RUE edema and flaccid weakness.  Lymphadenopathy:    Bilateral cervical lymphadenopathy.  Neurological: Alert and oriented to person, place, and time. Exhibits muscle wasting.  Skin: Skin is warm and dry. Positive for some RUE bruising. No rash noted. Not diaphoretic. No erythema. No pallor.  Psychiatric: Mood, memory and judgment normal.  Vitals reviewed.  LABORATORY DATA: Lab Results  Component Value Date   WBC 8.6 04/05/2024   HGB 10.6 (L) 04/05/2024   HCT 32.5 (L) 04/05/2024   MCV  93.7 04/05/2024   PLT 293 04/05/2024      Chemistry      Component Value Date/Time   NA 137 04/05/2024 1100   K 4.1 04/05/2024 1100   CL 99 04/05/2024 1100   CO2 30 04/05/2024 1100   BUN 12 04/05/2024 1100   CREATININE 0.38 (L) 04/05/2024 1100      Component Value Date/Time   CALCIUM  9.4 04/05/2024 1100   ALKPHOS 106 04/05/2024 1100   AST 14 (L) 04/05/2024 1100   ALT 14 04/05/2024 1100   BILITOT 0.4 04/05/2024 1100       RADIOGRAPHIC STUDIES:  US  THORACENTESIS ASP PLEURAL SPACE W/IMG GUIDE Result Date: 03/10/2024 INDICATION: Patient with history of metastatic right lung adenocarcinoma, dyspnea, recurrent right pleural effusion. Request received for therapeutic right thoracentesis. EXAM: ULTRASOUND GUIDED THERAPEUTIC RIGHT THORACENTESIS MEDICATIONS: 8 mL 1% lidocaine  with epinephrine  COMPLICATIONS: None immediate. PROCEDURE: An ultrasound guided thoracentesis was thoroughly discussed with the patient and questions answered. The benefits, risks, alternatives and complications were also discussed. The patient understands and wishes to proceed  with the procedure. Written consent was obtained. Ultrasound was performed to localize and mark an adequate pocket of fluid in the right chest. The area was then prepped and draped in the normal sterile fashion. 1% Lidocaine  was used for local anesthesia. Under ultrasound guidance a 6 Fr Safe-T-Centesis catheter was introduced. Thoracentesis was performed. The catheter was removed and a dressing applied. FINDINGS: A total of approximately 600 cc of yellow fluid was removed. IMPRESSION: Successful ultrasound guided therapeutic right thoracentesis yielding 600 cc of pleural fluid. Performed by: Franky Rakers, PA-C Electronically Signed   By: Cordella Banner   On: 03/10/2024 12:05   DG Chest 1 View Result Date: 03/10/2024 CLINICAL DATA:  Post right-sided thoracentesis. EXAM: CHEST  1 VIEW COMPARISON:  01/05/2024 and CT 02/16/2024 FINDINGS: Lungs are adequately inflated. Left lung is clear. Mild interval improvement in patient's right pleural effusion post thoracentesis with small amount residual right pleural fluid present. No pneumothorax. Improved right hilar density. Cardiomediastinal silhouette and remainder of the exam is unchanged including subluxed right humeral head. IMPRESSION: 1. Mild interval improvement in patient's right pleural effusion post thoracentesis with small amount of residual right pleural fluid. No pneumothorax. 2. Improved right hilar density. Electronically Signed   By: Toribio Agreste M.D.   On: 03/10/2024 12:01     ASSESSMENT/PLAN:  This is a very pleasant 63 year old Caucasian male with recurrent lung cancer initially diagnosed with stage IIIb non-small cell lung cancer, adenocarcinoma.  He presented with a right upper lobe lung mass in addition to mediastinal and bilateral supraclavicular lymphadenopathy.  He was diagnosed in January 2020.  His PDL 1 expression is 10% and he has no actionable mutations.    He underwent a course of concurrent chemoradiation with weekly carboplatin   and paclitaxel . He is status post 6 cycles.  He tolerated well except for fatigue and odynophagia.    He completed his 26 cycles of consolidation immunotherapy with Imfinzi  10 mg/kg IV every 2 weeks. He tolerated it well without any adverse side effects except has a very mild skin rash over his right cervical area. He completed radiation to the enlarging right cervical lymph nodes. His last radiation treatment was on 04/13/2019.   He received 1 cycle of systemic chemotherapy with carboplatin , Alimta  and Keytruda . Repeat tissue biopsy from the left supraclavicular lymph nodes and molecular studies showed that the patient has positive ALK gene translocation. His previous molecular studies by [employer] was  negative. Dr. Sherrod discontinue his systemic chemotherapy for now because of the new findings on the molecular studies.    The patient has been on treatment with [name]  600 mg p.o. twice daily.  He is status post 43 months of treatment.   He unfortunately was recently found to have evidence of disease progression and his treatment was switched to 100 mg of [name]  p.o. daily.  He started this on 10/20/2023.    He completed palliative radiation to the cervical lymph nodes on 11/17/23.   The patient had significant intolerance to [name]  and he was recently hospitalized with hallucinations. He was discharged on 11/22/23.    The patient saw [facility] for second opinion who recommends chemotherapy.    He is currently undergoing systemic chemotherapy with carboplatin  for an AUC of 4 (dose reduced) and Alimta  400 mg/m (dose reduced), and [name]  IV every 3 weeks. He is status post 5 cycles of treatment.  Starting from cycle #5 he started single agent chemotherapy with Alimta  400 mg/m.  I will arrange for EKG to ensure no arrhythmia. This showed sinus tachycardia with a pulse of 111 BP. Will add 500 cc-1 L of IVF to be administered with treatment today based on what time permits.   Labs were  reviewed.  Recommend that he proceed with cycle #6 today scheduled.  We will see him back for restaging CT scan of the chest, abdomen, and pelvis prior to his next cycle of treatment.   Patient saw pulmonary medicine previously and he was  not interested in a Pleurx at that time. He undergoes thoracentesis on as needed basis the most recent being on 03/10/24. He knows to contact them if he feels like he requires another thoracentesis.   We will see him back for labs and a follow-up visit in 3 weeks before undergoing cycle #7.  Vision changes Worsening vision, possibly age-related. Blurriness noted, reading glasses provide some relief but cause headaches. - Recommend eye exam to assess vision changes.  Starting from his next treatment. He will start maintenance treatment with [name]  and Alimta . After his next appointment, he no longer will need weekly labs.   The patient was advised to call immediately if he has any concerning symptoms in the interval. The patient voices understanding of current disease status and treatment options and is in agreement with the current care plan. All questions were answered. The patient knows to call the clinic with any problems, questions or concerns. We can certainly see the patient much sooner if necessary   Orders Placed This Encounter  Procedures   CT CHEST ABDOMEN PELVIS W CONTRAST    Standing Status:   Future    Expected Date:   04/19/2024    Expiration Date:   04/05/2025    If indicated for the ordered procedure, I authorize the administration of contrast media per Radiology protocol:   Yes    Does the patient have a contrast media/X-ray dye allergy?:   No    Preferred imaging location?:   [facility]    If indicated for the ordered procedure, I authorize the administration of oral contrast media per Radiology protocol:   Yes   EKG 12-Lead    Standing Status:   Future    Expected Date:   04/05/2024    Expiration Date:   04/05/2025     The  total time spent in the appointment was 20-29 minutes  Leib Elahi L Clavin Ruhlman, PA-C 04/05/24

## 2024-04-04 MED FILL — Fosaprepitant Dimeglumine For IV Infusion 150 MG (Base Eq): INTRAVENOUS | Qty: 5 | Status: AC

## 2024-04-05 ENCOUNTER — Other Ambulatory Visit: Payer: Self-pay | Admitting: Internal Medicine

## 2024-04-05 ENCOUNTER — Encounter: Payer: Self-pay | Admitting: Internal Medicine

## 2024-04-05 ENCOUNTER — Inpatient Hospital Stay: Attending: Internal Medicine

## 2024-04-05 ENCOUNTER — Telehealth: Payer: Self-pay | Admitting: Internal Medicine

## 2024-04-05 ENCOUNTER — Inpatient Hospital Stay (HOSPITAL_BASED_OUTPATIENT_CLINIC_OR_DEPARTMENT_OTHER): Admitting: Physician Assistant

## 2024-04-05 ENCOUNTER — Other Ambulatory Visit: Payer: Self-pay

## 2024-04-05 ENCOUNTER — Inpatient Hospital Stay

## 2024-04-05 VITALS — BP 143/97 | HR 119 | Temp 98.0°F | Resp 16 | Wt 122.8 lb

## 2024-04-05 VITALS — BP 128/89 | HR 96 | Resp 16

## 2024-04-05 DIAGNOSIS — C7931 Secondary malignant neoplasm of brain: Secondary | ICD-10-CM | POA: Insufficient documentation

## 2024-04-05 DIAGNOSIS — C3491 Malignant neoplasm of unspecified part of right bronchus or lung: Secondary | ICD-10-CM

## 2024-04-05 DIAGNOSIS — Z5111 Encounter for antineoplastic chemotherapy: Secondary | ICD-10-CM | POA: Diagnosis present

## 2024-04-05 DIAGNOSIS — Z5112 Encounter for antineoplastic immunotherapy: Secondary | ICD-10-CM | POA: Diagnosis present

## 2024-04-05 DIAGNOSIS — I10 Essential (primary) hypertension: Secondary | ICD-10-CM | POA: Diagnosis not present

## 2024-04-05 DIAGNOSIS — R000 Tachycardia, unspecified: Secondary | ICD-10-CM

## 2024-04-05 DIAGNOSIS — Z79899 Other long term (current) drug therapy: Secondary | ICD-10-CM | POA: Insufficient documentation

## 2024-04-05 DIAGNOSIS — E039 Hypothyroidism, unspecified: Secondary | ICD-10-CM | POA: Diagnosis not present

## 2024-04-05 DIAGNOSIS — Z9221 Personal history of antineoplastic chemotherapy: Secondary | ICD-10-CM | POA: Insufficient documentation

## 2024-04-05 DIAGNOSIS — C3411 Malignant neoplasm of upper lobe, right bronchus or lung: Secondary | ICD-10-CM | POA: Diagnosis present

## 2024-04-05 DIAGNOSIS — Z923 Personal history of irradiation: Secondary | ICD-10-CM | POA: Insufficient documentation

## 2024-04-05 DIAGNOSIS — Z79890 Hormone replacement therapy (postmenopausal): Secondary | ICD-10-CM | POA: Diagnosis not present

## 2024-04-05 DIAGNOSIS — Z86718 Personal history of other venous thrombosis and embolism: Secondary | ICD-10-CM | POA: Diagnosis not present

## 2024-04-05 DIAGNOSIS — C778 Secondary and unspecified malignant neoplasm of lymph nodes of multiple regions: Secondary | ICD-10-CM | POA: Diagnosis not present

## 2024-04-05 LAB — CBC WITH DIFFERENTIAL (CANCER CENTER ONLY)
Abs Immature Granulocytes: 0.03 K/uL (ref 0.00–0.07)
Basophils Absolute: 0 K/uL (ref 0.0–0.1)
Basophils Relative: 0 %
Eosinophils Absolute: 0 K/uL (ref 0.0–0.5)
Eosinophils Relative: 0 %
HCT: 32.5 % — ABNORMAL LOW (ref 39.0–52.0)
Hemoglobin: 10.6 g/dL — ABNORMAL LOW (ref 13.0–17.0)
Immature Granulocytes: 0 %
Lymphocytes Relative: 4 %
Lymphs Abs: 0.4 K/uL — ABNORMAL LOW (ref 0.7–4.0)
MCH: 30.5 pg (ref 26.0–34.0)
MCHC: 32.6 g/dL (ref 30.0–36.0)
MCV: 93.7 fL (ref 80.0–100.0)
Monocytes Absolute: 0.8 K/uL (ref 0.1–1.0)
Monocytes Relative: 9 %
Neutro Abs: 7.4 K/uL (ref 1.7–7.7)
Neutrophils Relative %: 87 %
Platelet Count: 293 K/uL (ref 150–400)
RBC: 3.47 MIL/uL — ABNORMAL LOW (ref 4.22–5.81)
RDW: 17.2 % — ABNORMAL HIGH (ref 11.5–15.5)
WBC Count: 8.6 K/uL (ref 4.0–10.5)
nRBC: 0 % (ref 0.0–0.2)

## 2024-04-05 LAB — CMP (CANCER CENTER ONLY)
ALT: 14 U/L (ref 0–44)
AST: 14 U/L — ABNORMAL LOW (ref 15–41)
Albumin: 3.5 g/dL (ref 3.5–5.0)
Alkaline Phosphatase: 106 U/L (ref 38–126)
Anion gap: 8 (ref 5–15)
BUN: 12 mg/dL (ref 6–20)
CO2: 30 mmol/L (ref 22–32)
Calcium: 9.4 mg/dL (ref 8.9–10.3)
Chloride: 99 mmol/L (ref 98–111)
Creatinine: 0.38 mg/dL — ABNORMAL LOW (ref 0.61–1.24)
GFR, Estimated: >60 mL/min (ref >60)
Glucose, Bld: 143 mg/dL — ABNORMAL HIGH (ref 70–99)
Potassium: 4.1 mmol/L (ref 3.5–5.1)
Sodium: 137 mmol/L (ref 135–145)
Total Bilirubin: 0.4 mg/dL (ref 0.0–1.2)
Total Protein: 6.9 g/dL (ref 6.5–8.1)

## 2024-04-05 LAB — TOTAL PROTEIN, URINE DIPSTICK: Protein, ur: NEGATIVE mg/dL

## 2024-04-05 MED ORDER — CYANOCOBALAMIN 1000 MCG/ML IJ SOLN
1000.0000 ug | Freq: Once | INTRAMUSCULAR | Status: AC
  Administered 2024-04-05: 1000 ug via INTRAMUSCULAR
  Filled 2024-04-05: qty 1

## 2024-04-05 MED ORDER — PALONOSETRON HCL INJECTION 0.25 MG/5ML
0.2500 mg | Freq: Once | INTRAVENOUS | Status: AC
  Administered 2024-04-05: 0.25 mg via INTRAVENOUS
  Filled 2024-04-05: qty 5

## 2024-04-05 MED ORDER — DIPHENHYDRAMINE HCL 50 MG/ML IJ SOLN
25.0000 mg | Freq: Once | INTRAMUSCULAR | Status: AC
  Administered 2024-04-05: 25 mg via INTRAVENOUS
  Filled 2024-04-05: qty 1

## 2024-04-05 MED ORDER — DEXAMETHASONE SODIUM PHOSPHATE 10 MG/ML IJ SOLN
10.0000 mg | Freq: Once | INTRAMUSCULAR | Status: AC
  Administered 2024-04-05: 10 mg via INTRAVENOUS
  Filled 2024-04-05: qty 1

## 2024-04-05 MED ORDER — SODIUM CHLORIDE 0.9 % IV SOLN
15.0000 mg/kg | Freq: Once | INTRAVENOUS | Status: AC
  Administered 2024-04-05: 900 mg via INTRAVENOUS
  Filled 2024-04-05: qty 4

## 2024-04-05 MED ORDER — SODIUM CHLORIDE 0.9 % IV SOLN
546.0000 mg | Freq: Once | INTRAVENOUS | Status: AC
  Administered 2024-04-05: 550 mg via INTRAVENOUS
  Filled 2024-04-05: qty 55

## 2024-04-05 MED ORDER — SODIUM CHLORIDE 0.9 % IV SOLN: Freq: Once | INTRAVENOUS | Status: AC

## 2024-04-05 MED ORDER — SODIUM CHLORIDE 0.9 % IV SOLN
500.0000 mg/m2 | Freq: Once | INTRAVENOUS | Status: AC
  Administered 2024-04-05: 900 mg via INTRAVENOUS
  Filled 2024-04-05: qty 20

## 2024-04-05 MED ORDER — SODIUM CHLORIDE 0.9 % IV SOLN: INTRAVENOUS | Status: DC

## 2024-04-05 MED ORDER — FAMOTIDINE IN NACL 20-0.9 MG/50ML-% IV SOLN
20.0000 mg | Freq: Once | INTRAVENOUS | Status: AC
  Administered 2024-04-05: 20 mg via INTRAVENOUS
  Filled 2024-04-05: qty 50

## 2024-04-05 MED ORDER — SODIUM CHLORIDE 0.9 % IV SOLN
150.0000 mg | Freq: Once | INTRAVENOUS | Status: AC
  Administered 2024-04-05: 150 mg via INTRAVENOUS
  Filled 2024-04-05: qty 150

## 2024-04-05 NOTE — Telephone Encounter (Signed)
 Scheduled appointments per WQ. Called and left VM with appointment details for the patient.

## 2024-04-05 NOTE — Progress Notes (Signed)
 EKG done on patient r/t his heart rate. Primarily ST- Cassie Heilingoetter, PA looked at the EKG and cleared patient for treatment today. IVF to be given.

## 2024-04-05 NOTE — Patient Instructions (Signed)
 [facility] - A DEPT OF [facility]  Discharge Instructions: Thank you for choosing [facility] to provide your oncology and hematology care.   If you have a lab appointment with the Cancer Center, please go directly to the Cancer Center and check in at the registration area.   Wear comfortable clothing and clothing appropriate for easy access to any Portacath or PICC line.   We strive to give you quality time with your provider. You may need to reschedule your appointment if you arrive late (15 or more minutes).  Arriving late affects you and other patients whose appointments are after yours.  Also, if you miss three or more appointments without notifying the office, you may be dismissed from the clinic at the provider's discretion.      For prescription refill requests, have your pharmacy contact our office and allow 72 hours for refills to be completed.    Today you received the following chemotherapy and/or immunotherapy agents mvasi , alimta, carboplatin       To help prevent nausea and vomiting after your treatment, we encourage you to take your nausea medication as directed.  BELOW ARE SYMPTOMS THAT SHOULD BE REPORTED IMMEDIATELY: *FEVER GREATER THAN 100.4 F (38 C) OR HIGHER *CHILLS OR SWEATING *NAUSEA AND VOMITING THAT IS NOT CONTROLLED WITH YOUR NAUSEA MEDICATION *UNUSUAL SHORTNESS OF BREATH *UNUSUAL BRUISING OR BLEEDING *URINARY PROBLEMS (pain or burning when urinating, or frequent urination) *BOWEL PROBLEMS (unusual diarrhea, constipation, pain near the anus) TENDERNESS IN MOUTH AND THROAT WITH OR WITHOUT PRESENCE OF ULCERS (sore throat, sores in mouth, or a toothache) UNUSUAL RASH, SWELLING OR PAIN  UNUSUAL VAGINAL DISCHARGE OR ITCHING   Items with * indicate a potential emergency and should be followed up as soon as possible or go to the Emergency Department if any problems should occur.  Please show the CHEMOTHERAPY ALERT CARD  or IMMUNOTHERAPY ALERT CARD at check-in to the Emergency Department and triage nurse.  Should you have questions after your visit or need to cancel or reschedule your appointment, please contact [facility] - A DEPT OF [facility]  Dept: [phone]  and follow the prompts.  Office hours are 8:00 a.m. to 4:30 p.m. Monday - Friday. Please note that voicemails left after 4:00 p.m. may not be returned until the following business day.  We are closed weekends and major holidays. You have access to a nurse at all times for urgent questions. Please call the main number to the clinic Dept: [phone] and follow the prompts.   For any non-urgent questions, you may also contact your provider using MyChart. We now offer e-Visits for anyone 66 and older to request care online for non-urgent symptoms. For details visit [email or web address].   Also download the MyChart app! Go to the app store, search "MyChart", open the app, select [facility], and log in with your MyChart username and password.

## 2024-04-09 ENCOUNTER — Other Ambulatory Visit: Payer: Self-pay | Admitting: Neurology

## 2024-04-09 DIAGNOSIS — M792 Neuralgia and neuritis, unspecified: Secondary | ICD-10-CM

## 2024-04-09 DIAGNOSIS — G540 Brachial plexus disorders: Secondary | ICD-10-CM

## 2024-04-10 ENCOUNTER — Other Ambulatory Visit: Payer: Self-pay | Admitting: Physician Assistant

## 2024-04-10 MED ORDER — CYCLOBENZAPRINE HCL 5 MG PO TABS: 5.0000 mg | ORAL_TABLET | Freq: Three times a day (TID) | ORAL | 0 refills | Status: DC | PRN

## 2024-04-11 NOTE — Telephone Encounter (Signed)
 Called pt and he is taking 2 ( 600mg  ) total of 1200 mg and in Dr. Leigh notes it is 600mg  at night. Pt reported having enough until Dr. Leigh returns to office on 04/18/2024

## 2024-04-17 ENCOUNTER — Encounter: Payer: Self-pay | Admitting: Internal Medicine

## 2024-04-17 NOTE — Progress Notes (Signed)
Orders placed in this encounter.

## 2024-04-19 ENCOUNTER — Encounter (HOSPITAL_COMMUNITY): Payer: Self-pay

## 2024-04-19 ENCOUNTER — Ambulatory Visit (HOSPITAL_COMMUNITY)
Admission: RE | Admit: 2024-04-19 | Discharge: 2024-04-19 | Disposition: A | Discharge status: Home / Self Care | Source: Ambulatory Visit | Attending: Physician Assistant | Admitting: Physician Assistant

## 2024-04-19 DIAGNOSIS — C3491 Malignant neoplasm of unspecified part of right bronchus or lung: Secondary | ICD-10-CM

## 2024-04-19 MED ORDER — IOHEXOL 300 MG/ML  SOLN: 100.0000 mL | Freq: Once | INTRAMUSCULAR | Status: DC | PRN

## 2024-04-20 NOTE — Progress Notes (Signed)
 [facility] OFFICE PROGRESS NOTE  [name], MD [address] [place] [address]  DIAGNOSIS: Metastatic non-small cell lung cancer initially diagnosed as stage IIIB (T1c, N3, M0) non-small cell lung cancer, adenocarcinoma presented with right upper lobe lung mass in addition to mediastinal and bilateral supraclavicular lymphadenopathy diagnosed in January 2020.  He had evidence of disease recurrence in June 2021 with adenopathy in the subcarinal, level 3 left neck lymph node, and lymph node at the curious of the diaphragm and in the upper abdomen.   PD-L1: 10%   Guardant 360 molecular studies showed no actionable mutation   Foundation One Testing:   Biomarker Findings Microsatellite status - MS-Stable Tumor Mutational Burden - 4 Muts/Mb Genomic Findings For a complete list of the genes assayed, please refer to the Appendix. ALK EML4-ALK fusion (Variant 2) SMARCB1 R377C CTNNB1 S45P CDKN2A/B CDKN2B loss, CDKN2A loss CXCR4 W106* TP53 splice site 672G>A 7 Disease relevant genes with no reportable alterations: BRAF, EGFR, ERBB2, KRAS, MET, RET, ROS1  PRIOR THERAPY: 1) Concurrent chemoradiation with weekly carboplatin  for AUC of 2 and paclitaxel  45 mg/M2.  Status post 6 cycles.  Last dose was given on October 10, 2018 with stable disease. 2) Radiation treatment to the enlarging right cervical lymph nodes under the care of Dr. Shannon. First treatment 03/06/2019. Last treatment scheduled on 04/13/2019 3) Consolidation treatment with immunotherapy with Imfinzi  10 mg/KG every 2 weeks.  First dose November 17, 2018.  Status post 26 cycles. 4) Systemic chemotherapy with carboplatin  for an AUC of 5, Alimta  500 mg/m2, and Keytruda  200 mg IV every 3 weeks. First dose expected on 02/14/20. Status post 1 cycle.  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 5) SBRT to the left lower lobe lung  nodule. 6) [name] 600 mg p.o. twice daily.  He started the first dose on March 08, 2020.   Status post 44 months of treatment.  Discontinued in March 2025 due to disease progression 7) [name] 100 mg daily.  Discontinued in April 2025 due to severe intolerance due to mental status changes/confusion  8) Radiation to the bilateral cervical lymph nodes under the care of Dr. Dewey last day expected on 11/17/2023     CURRENT THERAPY: Palliative systemic chemotherapy with carboplatin  for an AUC of 4 (dose reduced), Alimta  400 mg/m (dose reduced), and [name]  IV every 3 weeks.  First dose expected on 12/23/2023. His dose will be increased to carboplatin  for an AUC of 5 and Alimta  500 mg starting from cycle #2. He is status post 6 cycles.   INTERVAL HISTORY: Brian [name] 63 y.o. male returns  to the clinic today for a follow-up visit accompanied by his wife.  The patient was last seen in clinic on 04/05/24 by myself.  The patient is currently undergoing chemotherapy.  He is currently on maintenance Alimta  and avastin .    His last chemotherapy treatment was uneventful, and he has not experienced any fevers or infections recently.   He has experienced a weight loss of three pounds since his last visit, despite an improvement in appetite.  He has been experiencing changes in his breathing, requiring the use of supplemental oxygen  during the day for about a week. No recent illness or history of blood clots, and he is not on blood thinners. Occasional coughing occurs, particularly when water  does not go down properly. No hemoptysis or chest pain.  He experienced nausea but did not take his prescribed nausea medication. No vomiting.  He reports constipation over the past week but has not needed to take any medication for it, such as Miralax  as it resolved without intervention.  He notes a change in his vision, which has been progressively worsening. He has an eye appointment  scheduled for next month.  He had an EKG last month that showed sinus tachycardia.   He is here today for evaluation of repeat blood work before undergoing cycle #7.   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  MEDICATIONS:  Current Outpatient Medications  Medication Sig Dispense Refill   methylPREDNISolone  (MEDROL  DOSEPAK) 4 MG TBPK tablet Use as instructed 21 tablet 0   acetaminophen  (TYLENOL ) 500 MG tablet Take 1,000 mg by mouth every 6 (six) hours as needed (for pain).     albuterol  (VENTOLIN  HFA) 108 (90 Base) MCG/ACT inhaler INHALE 2 PUFFS INTO THE LUNGS EVERY 4 (FOUR) HOURS AS NEEDED FOR WHEEZING OR SHORTNESS OF BREATH. 6.7 each 4   amLODipine  (NORVASC ) 5 MG tablet Take 0.5 tablets (2.5 mg total) by mouth in the morning. 30 tablet 2   atorvastatin  (LIPITOR) 20 MG tablet Take 20 mg by mouth in the morning.     cyclobenzaprine  (FLEXERIL ) 5 MG tablet Take 1 tablet (5 mg total) by mouth 3 (three) times daily as needed for muscle spasms. 30 tablet 0   dexamethasone  (DECADRON ) 4 MG tablet Take 1 tablet twice a day the day before, the day of, and the day after chemotherapy. 40 tablet 2   DULoxetine  (CYMBALTA ) 60 MG capsule Take 1 capsule (60 mg total) by mouth daily. 90 capsule 3   fluticasone  (FLONASE ) 50 MCG/ACT nasal  spray Place 1 spray into both nostrils daily. (Patient not taking: Reported on 02/23/2024) 1 mL 2   folic acid  (FOLVITE ) 1 MG tablet TAKE 1 TABLET BY MOUTH EVERY DAY 30 tablet 2   gabapentin  (NEURONTIN ) 600 MG tablet Take 600 mg by mouth.     levothyroxine  (SYNTHROID ) 100 MCG tablet Take 100 mcg by mouth daily before breakfast.     methylPREDNISolone  (MEDROL  DOSEPAK) 4 MG TBPK tablet Use as instructed (Patient not taking: Reported on 02/23/2024) 21 tablet 0   MIRALAX  17 GM/SCOOP powder Take 17 g by mouth daily as needed for mild constipation.     mirtazapine  (REMERON ) 15 MG tablet TAKE 1 TABLET BY MOUTH EVERYDAY AT BEDTIME 90 tablet 1   prochlorperazine  (COMPAZINE ) 10 MG tablet Take 1 tablet (10 mg total) by mouth every 6 (six) hours as needed. 30 tablet 2   TRELEGY ELLIPTA  100-62.5-25 MCG/ACT AEPB INHALE 1 PUFF BY MOUTH ONCE DAILY 180 each 3   venlafaxine  (EFFEXOR ) 37.5 MG tablet Take 37.5  mg by mouth in the morning.     No current facility-administered medications for this visit.    SURGICAL HISTORY:  Past Surgical History:  Procedure Laterality Date   COLONOSCOPY     FEMUR FRACTURE SURGERY Left 1969   INGUINAL HERNIA REPAIR Right 06/24/2020   Procedure: RIGHT OPEN INGUINAL HERNIA REPAIR WITH MESH;  Surgeon: Kinsinger, Herlene Righter, MD;  Location: [facility] ORS;  Service: General;  Laterality: Right;   IR IVC FILTER PLMT / S&I PORTER GUID/MOD SED  03/04/2020   IR IVC FILTER RETRIEVAL / S&I PORTER GUID/MOD SED  08/15/2020   IR RADIOLOGIST EVAL & MGMT  08/06/2020   SPINAL CORD STIMULATOR INSERTION N/A 10/05/2023   Procedure: CERVICAL SPINAL CORD STIMULATOR PLACEMENT;  Surgeon: Claudene Penne ORN, MD;  Location: [facility] ORS;  Service: Neurosurgery;  Laterality: N/A;   THORACENTESIS Right 12/10/2023   Procedure: THORACENTESIS;  Surgeon: Kara Dorn NOVAK, MD;  Location: [facility] END[facility]COPY;  Service: Pulmonary;  Laterality: Right;    REVIEW OF SYSTEMS:   Constitutional: Stable fatigue. Positive for weight loss.  Improved appetite. Negative for chills, fever and unexpected weight change.  HENT: Positive for stable hoarseness. Negative for mouth sores, nosebleeds, sore throat and trouble swallowing.   Eyes: Negative for eye problems and icterus.  Respiratory: Positive for intermittent dyspnea on exertion. Negative for cough, hemoptysis, and wheezing.   Cardiovascular: Negative for chest pain and leg swelling.  Gastrointestinal: Positive for mild occasional constipation (none at this time). Negative for abdominal pain, diarrhea, nausea and vomiting.  Genitourinary: Negative for bladder incontinence, difficulty urinating, dysuria, frequency and hematuria.   Musculoskeletal: Negative for back pain, gait problem, neck pain and neck stiffness.  Skin: Negative for itching and rash.  Neurological: Right upper extremity weakness. Negative for dizziness, gait problem, headaches, light-headedness and seizures.  Hematological: Negative for adenopathy. Does not bleed easily. Positive for easy bruising.  Psychiatric/Behavioral: Negative for confusion, depression and sleep disturbance. The patient is not nervous/anxious.   PHYSICAL EXAMINATION:  Blood pressure 110/72, pulse (!) 107, temperature (!) 97.2 F (36.2 C), temperature source Temporal, resp. rate 16, weight 120 lb 12.8 oz (54.8 kg), SpO2 97%.  ECOG PERFORMANCE STATUS: 1  Physical Exam  Constitutional: Oriented to person, place, and time and chronically ill appearing male and in no distress.  HENT:  Head: Normocephalic and atraumatic.  Mouth/Throat: Oropharynx is clear and moist. No oropharyngeal exudate.  Eyes: Conjunctivae are normal. Right eye exhibits no discharge. Left eye exhibits no discharge. No scleral icterus.  Neck: Normal range of motion. Neck supple.  Cardiovascular: Tachycardic, regular rhythm, normal heart sounds and intact distal pulses.   Pulmonary/Chest: Effort normal. No respiratory distress. No wheezes. No rales. On supplemental  oxygen .  Abdominal: Soft. Bowel sounds are normal. Exhibits no distension and no mass. There is no tenderness.  Musculoskeletal: Flaccid right upper extremity. RUE edema and flaccid weakness.  Lymphadenopathy:    Bilateral cervical lymphadenopathy.  Neurological: Alert and oriented to person, place, and time. Exhibits muscle wasting.  Skin: Skin is warm and dry. Positive for some RUE bruising. No rash noted. Not diaphoretic. No erythema. No pallor.  Psychiatric: Mood, memory and judgment normal.  Vitals reviewed.  LABORATORY DATA: Lab Results  Component Value Date   WBC 7.9 04/26/2024   HGB 10.8 (L) 04/26/2024   HCT 33.3 (L) 04/26/2024   MCV 96.8 04/26/2024   PLT 349 04/26/2024      Chemistry      Component Value Date/Time   NA 138 04/26/2024 1029  K 4.4 04/26/2024 1029   CL 97 (L) 04/26/2024 1029   CO2 33 (H) 04/26/2024 1029   BUN 9 04/26/2024 1029   CREATININE 0.46 (L) 04/26/2024 1029      Component Value Date/Time   CALCIUM  9.8 04/26/2024 1029   ALKPHOS 95 04/26/2024 1029   AST 13 (L) 04/26/2024 1029   ALT 8 04/26/2024 1029   BILITOT 0.3 04/26/2024 1029       RADIOGRAPHIC STUDIES:  CT CHEST ABDOMEN PELVIS W CONTRAST Result Date: 04/25/2024 CLINICAL DATA:  Stage IV adenocarcinoma of right lung. * Tracking Code: BO * EXAM: CT CHEST, ABDOMEN, AND PELVIS WITH CONTRAST TECHNIQUE: Multidetector CT imaging of the chest, abdomen and pelvis was performed following the standard protocol during bolus administration of intravenous contrast. RADIATION DOSE REDUCTION: This exam was performed according to the departmental dose-optimization program which includes automated exposure control, adjustment of the mA and/or kV according to patient size and/or use of iterative reconstruction technique. CONTRAST:  OMNIPAQUE  IOHEXOL  300 MG/ML  SOLN COMPARISON:  02/16/2024 FINDINGS: CT CHEST FINDINGS Cardiovascular: Aortic atherosclerosis. Normal heart size, without pericardial effusion.  Lad coronary artery calcification. No central pulmonary embolism, on this non-dedicated study. Mediastinum/Nodes: No hilar adenopathy. Soft tissue fullness centered about the upper thoracic esophagus measures 2.1 x 2.7 cm on 19/2 versus 2.2 x 2.0 cm on the prior exam. The upstream esophagus is mildly dilated with debris within including on 15/2, similar. Lungs/Pleura: Loculated small volume right-sided pleural fluid is minimally decreased. Small left pleural effusion is increased. Volume loss within the right lower lobe is similar, likely secondary to mass effect from the loculated right-sided pleural fluid, including the right major fissure. Left upper lobe 3 mm pulmonary nodule on 36/6 measured 2 mm on the prior. Left lower lobe 5 mm nodule on 67/6 measured 4 mm on the prior. There may be a 2 mm superior segment left lower lobe pulmonary nodule on 51/6, likely similar. New lateral, subpleural left lower lobe probable area of somewhat rounded consolidation, including at 3.6 cm on 95/6. Suspect a 3 mm lateral right lower lobe pulmonary nodule on 59/6, new. Musculoskeletal: Included within the abdomen pelvic section. CT ABDOMEN PELVIS FINDINGS Hepatobiliary: Mild limitations secondary to patient arm position and artifact from dorsal spinal stimulator battery throughout the abdomen. Normal liver. Normal gallbladder, without biliary ductal dilatation. Pancreas: Mild pancreatic atrophy. No duct dilatation or acute inflammation. Spleen: Normal in size, without focal abnormality. Adrenals/Urinary Tract: Normal adrenal glands. An upper pole left renal 1.9 cm fluid density lesion is likely a cyst and does not warrant imaging follow-up. Normal right kidney. No hydronephrosis. Normal urinary bladder. Stomach/Bowel: Normal stomach, without wall thickening. Colonic stool burden suggests constipation. Normal terminal ileum. Normal small bowel. Vascular/Lymphatic: Aortic atherosclerosis. No abdominopelvic adenopathy.  Reproductive: Mild prostatomegaly. Other: No significant free fluid. No evidence of omental or peritoneal disease. Musculoskeletal: Accentuation of expected thoracic kyphosis. Prominent disc bulge at L4-5. IMPRESSION: CT CHEST IMPRESSION 1. Soft tissue fullness within the posterior mediastinum, either compressing or involving the upper esophagus. This is felt to be mildly progressive since 02/16/2024. This either represents nodal metastasis or an esophageal primary. This causes partial esophageal obstruction, similar to the prior. 2. Tiny pulmonary nodules, felt to be minimally enlarged. Suspicious for pulmonary metastasis. 3. Decreased small loculated right-sided pleural effusion and increased small left pleural effusion. 4. New lateral left lower lobe rounded opacity, favored to represent either infection or in the appropriate clinical setting infarct (no large central pulmonary embolism identified). Correlate  with infectious symptoms. 5. Coronary artery atherosclerosis. Aortic Atherosclerosis (ICD10-I70.0). CT ABDOMEN AND PELVIS IMPRESSION 1. No acute process or evidence of metastatic disease in the abdomen or pelvis. 2.  Possible constipation. These results will be called to the ordering clinician or representative by the Radiologist Assistant, and communication documented in the PACS or [employer]. Electronically Signed   By: Rockey Kilts M.D.   On: 04/25/2024 17:33     ASSESSMENT/PLAN:  This is a very pleasant 63 year old Caucasian male with recurrent lung cancer initially diagnosed with stage IIIb non-small cell lung cancer, adenocarcinoma.  He presented with a right upper lobe lung mass in addition to mediastinal and bilateral supraclavicular lymphadenopathy.  He was diagnosed in January 2020.  His PDL 1 expression is 10% and he has no actionable mutations.    He underwent a course of concurrent chemoradiation with weekly carboplatin  and paclitaxel . He is status post 6 cycles.  He tolerated well  except for fatigue and odynophagia.    He completed his 26 cycles of consolidation immunotherapy with Imfinzi  10 mg/kg IV every 2 weeks. He tolerated it well without any adverse side effects except has a very mild skin rash over his right cervical area. He completed radiation to the enlarging right cervical lymph nodes. His last radiation treatment was on 04/13/2019.   He received 1 cycle of systemic chemotherapy with carboplatin , Alimta  and Keytruda . Repeat tissue biopsy from the left supraclavicular lymph nodes and molecular studies showed that the patient has positive ALK gene translocation. His previous molecular studies by Guardant 360 was negative. Dr. Sherrod discontinue his systemic chemotherapy for now because of the new findings on the molecular studies.    The patient has been on treatment with [name]  600 mg p.o. twice daily.  He is status post 43 months of treatment.    He unfortunately was recently found to have evidence of disease progression and his treatment was switched to 100 mg of [name]  p.o. daily.  He started this on 10/20/2023.    He completed palliative radiation to the cervical lymph nodes on 11/17/23.   The patient had significant intolerance to [name]  and he was recently hospitalized with hallucinations. He was discharged on 11/22/23.    The patient saw [facility] for second opinion who recommends chemotherapy.    He is currently undergoing systemic chemotherapy with carboplatin  for an AUC of 4 (dose reduced) and Alimta  400 mg/m (dose reduced), and [name]  IV every 3 weeks. He is status post 6 cycles of treatment.  Starting from cycle #5 he started single agent chemotherapy with Alimta  400 mg/m and avastin .  The patient was seen with Dr. Sherrod today.  Dr. Sherrod personally and independently reviewed the scan and discussed results with the patient today.  The scan showed New lateral left lower lobe rounded opacity, favored to represent either infection or in the  appropriate clinical setting infarct.  Dr. Sherrod recommends a PET scan to further evaluate this in case it is related to progression.   If concerning for progression then Dr. Sherrod will discuss other options next time including another oral pill.   Labs were reviewed.  Recommend that he proceed with cycle #7 today scheduled.  He is okay to treat with his heart rate of 107. We will hold avastin  due to elevated urine protein. He will only receive Alimta  today.   Will give him a Medrol  Dosepak for his weight loss.  He is not interested in Marinol or Megace after discussing the side effect profile.  Patient saw pulmonary medicine previously and he was  not interested in a Pleurx at that time. He undergoes thoracentesis on as needed basis the most recent being on 03/10/24. He knows to contact them if he feels like he requires another thoracentesis.    We will see him back for labs and a follow-up visit in 3 weeks before undergoing cycle #8.   Vision changes Worsening vision, possibly age-related. Blurriness noted, reading glasses provide some relief but cause headaches. -He will see his eye doctor as scheduled  The patient was advised to call immediately if he has any concerning symptoms in the interval. The patient voices understanding of current disease status and treatment options and is in agreement with the current care plan. All questions were answered. The patient knows to call the clinic with any problems, questions or concerns. We can certainly see the patient much sooner if necessary   Orders Placed This Encounter  Procedures   NM PET Image Restag (PS) Skull Base To Thigh    Standing Status:   Future    Expected Date:   05/03/2024    Expiration Date:   04/26/2025    If indicated for the ordered procedure, I authorize the administration of a radiopharmaceutical per Radiology protocol:   Yes    Preferred imaging location?:   Darryle Darra Calton LITTIE Jenea Dake,  PA-C 04/26/24  ADDENDUM: Hematology/Oncology Attending: I had a face-to-face encounter with the patient today.  I reviewed his record, lab, scan and recommended his care plan.  This is a very pleasant 63 years old white male with metastatic non-small cell lung cancer, adenocarcinoma that was initially diagnosed as a stage IIIb in January 2020 and then he had evidence for disease recurrence in June 2021 as well as metastasis later on.  He was initially treated with a course of concurrent chemoradiation with weekly carboplatin  and paclitaxel  in addition to palliative radiotherapy to the recurrent disease in the right supraclavicular lymphadenopathy.  His molecular studies showed positive ALK gene translocation and the patient was treated with targeted therapy with alectinib for around 44 months before it was discontinued in March 2025 secondary to disease progression.  He was tried on [name]  in April 2025 but unfortunately he has significant intolerance to the medicine with mental status change and confusion and this was discontinued.  He also received radiation to the bilateral cervical lymph nodes in April 2025.  The patient then started palliative systemic chemotherapy with carboplatin , Alimta  and [name]  every 3 weeks status post 6 cycles.  He had repeat CT scan of the chest, abdomen and pelvis performed recently.  I personally and independently reviewed the scan and discussed the result with the patient and his wife.  His scan showed soft tissue fullness within the posterior mediastinum compressing or involving the upper esophagus felt to be mildly progressive since July 2025.  This also could represent either a nodal metastasis or an esophageal primary.  There was tiny pulmonary nodules felt to be minimally enlargement as well as new lateral left lower lobe rounded opacity favored to represent either infection or infarct but no central pulmonary embolism identified.  I had a lengthy discussion with the  patient and his wife today about his current condition and treatment options.  I recommended for him to have a PET scan for further evaluation of his disease and to rule out any progression.  In the meantime he will continue his maintenance treatment and he will proceed with cycle #  7 today with Alimta  and [name] .  We will see him back for follow-up visit in 3 weeks for reevaluation and discussion of his future treatment options based on the PET scan results. The patient was advised to call immediately if he has any other concerning symptoms in the interval. The total time spent in the appointment was 30 minutes including review of chart and various tests results, discussions about plan of care and coordination of care plan . Disclaimer: This note was dictated with voice recognition software. Similar sounding words can inadvertently be transcribed and may be missed upon review. Sherrod MARLA Sherrod, MD

## 2024-04-21 NOTE — Telephone Encounter (Signed)
 Called pt and left message to check and make sure to correct dose that he is taking. Told to call office.

## 2024-04-25 ENCOUNTER — Ambulatory Visit (HOSPITAL_COMMUNITY)
Admission: RE | Admit: 2024-04-25 | Discharge: 2024-04-25 | Disposition: A | Discharge status: Home / Self Care | Source: Ambulatory Visit | Attending: Physician Assistant | Admitting: Physician Assistant

## 2024-04-25 DIAGNOSIS — J90 Pleural effusion, not elsewhere classified: Secondary | ICD-10-CM | POA: Diagnosis not present

## 2024-04-25 DIAGNOSIS — C3491 Malignant neoplasm of unspecified part of right bronchus or lung: Secondary | ICD-10-CM | POA: Diagnosis not present

## 2024-04-25 DIAGNOSIS — I700 Atherosclerosis of aorta: Secondary | ICD-10-CM | POA: Diagnosis not present

## 2024-04-25 DIAGNOSIS — K222 Esophageal obstruction: Secondary | ICD-10-CM | POA: Diagnosis not present

## 2024-04-25 MED ORDER — IOHEXOL 300 MG/ML  SOLN
100.0000 mL | Freq: Once | INTRAMUSCULAR | Status: AC | PRN
Start: 2024-04-25 — End: 2024-04-25
  Administered 2024-04-25: 100 mL via INTRAVENOUS

## 2024-04-25 MED FILL — Fosaprepitant Dimeglumine For IV Infusion 150 MG (Base Eq): INTRAVENOUS | Qty: 5 | Status: AC

## 2024-04-26 ENCOUNTER — Inpatient Hospital Stay

## 2024-04-26 ENCOUNTER — Telehealth: Payer: Self-pay

## 2024-04-26 ENCOUNTER — Encounter: Payer: Self-pay | Admitting: Internal Medicine

## 2024-04-26 ENCOUNTER — Other Ambulatory Visit: Payer: Self-pay | Admitting: Physician Assistant

## 2024-04-26 ENCOUNTER — Inpatient Hospital Stay (HOSPITAL_BASED_OUTPATIENT_CLINIC_OR_DEPARTMENT_OTHER): Admitting: Physician Assistant

## 2024-04-26 VITALS — BP 110/72 | HR 107 | Temp 97.2°F | Resp 16 | Wt 120.8 lb

## 2024-04-26 DIAGNOSIS — C7931 Secondary malignant neoplasm of brain: Secondary | ICD-10-CM | POA: Diagnosis not present

## 2024-04-26 DIAGNOSIS — C3491 Malignant neoplasm of unspecified part of right bronchus or lung: Secondary | ICD-10-CM

## 2024-04-26 DIAGNOSIS — Z79899 Other long term (current) drug therapy: Secondary | ICD-10-CM | POA: Diagnosis not present

## 2024-04-26 DIAGNOSIS — C778 Secondary and unspecified malignant neoplasm of lymph nodes of multiple regions: Secondary | ICD-10-CM | POA: Diagnosis not present

## 2024-04-26 DIAGNOSIS — Z79890 Hormone replacement therapy (postmenopausal): Secondary | ICD-10-CM | POA: Diagnosis not present

## 2024-04-26 DIAGNOSIS — C3411 Malignant neoplasm of upper lobe, right bronchus or lung: Secondary | ICD-10-CM | POA: Diagnosis not present

## 2024-04-26 DIAGNOSIS — Z5112 Encounter for antineoplastic immunotherapy: Secondary | ICD-10-CM | POA: Diagnosis not present

## 2024-04-26 DIAGNOSIS — I10 Essential (primary) hypertension: Secondary | ICD-10-CM | POA: Diagnosis not present

## 2024-04-26 DIAGNOSIS — Z5111 Encounter for antineoplastic chemotherapy: Secondary | ICD-10-CM | POA: Diagnosis not present

## 2024-04-26 DIAGNOSIS — E039 Hypothyroidism, unspecified: Secondary | ICD-10-CM | POA: Diagnosis not present

## 2024-04-26 DIAGNOSIS — Z923 Personal history of irradiation: Secondary | ICD-10-CM | POA: Diagnosis not present

## 2024-04-26 DIAGNOSIS — Z9221 Personal history of antineoplastic chemotherapy: Secondary | ICD-10-CM | POA: Diagnosis not present

## 2024-04-26 DIAGNOSIS — Z86718 Personal history of other venous thrombosis and embolism: Secondary | ICD-10-CM | POA: Diagnosis not present

## 2024-04-26 LAB — CBC WITH DIFFERENTIAL (CANCER CENTER ONLY)
Abs Immature Granulocytes: 0.04 K/uL (ref 0.00–0.07)
Basophils Absolute: 0 K/uL (ref 0.0–0.1)
Basophils Relative: 1 %
Eosinophils Absolute: 0 K/uL (ref 0.0–0.5)
Eosinophils Relative: 0 %
HCT: 33.3 % — ABNORMAL LOW (ref 39.0–52.0)
Hemoglobin: 10.8 g/dL — ABNORMAL LOW (ref 13.0–17.0)
Immature Granulocytes: 1 %
Lymphocytes Relative: 4 %
Lymphs Abs: 0.3 K/uL — ABNORMAL LOW (ref 0.7–4.0)
MCH: 31.4 pg (ref 26.0–34.0)
MCHC: 32.4 g/dL (ref 30.0–36.0)
MCV: 96.8 fL (ref 80.0–100.0)
Monocytes Absolute: 0.8 K/uL (ref 0.1–1.0)
Monocytes Relative: 10 %
Neutro Abs: 6.7 K/uL (ref 1.7–7.7)
Neutrophils Relative %: 84 %
Platelet Count: 349 K/uL (ref 150–400)
RBC: 3.44 MIL/uL — ABNORMAL LOW (ref 4.22–5.81)
RDW: 14.6 % (ref 11.5–15.5)
WBC Count: 7.9 K/uL (ref 4.0–10.5)
nRBC: 0 % (ref 0.0–0.2)

## 2024-04-26 LAB — CMP (CANCER CENTER ONLY)
ALT: 8 U/L (ref 0–44)
AST: 13 U/L — ABNORMAL LOW (ref 15–41)
Albumin: 3.6 g/dL (ref 3.5–5.0)
Alkaline Phosphatase: 95 U/L (ref 38–126)
Anion gap: 8 (ref 5–15)
BUN: 9 mg/dL (ref 6–20)
CO2: 33 mmol/L — ABNORMAL HIGH (ref 22–32)
Calcium: 9.8 mg/dL (ref 8.9–10.3)
Chloride: 97 mmol/L — ABNORMAL LOW (ref 98–111)
Creatinine: 0.46 mg/dL — ABNORMAL LOW (ref 0.61–1.24)
GFR, Estimated: >60 mL/min (ref >60)
Glucose, Bld: 140 mg/dL — ABNORMAL HIGH (ref 70–99)
Potassium: 4.4 mmol/L (ref 3.5–5.1)
Sodium: 138 mmol/L (ref 135–145)
Total Bilirubin: 0.3 mg/dL (ref 0.0–1.2)
Total Protein: 7.3 g/dL (ref 6.5–8.1)

## 2024-04-26 LAB — TOTAL PROTEIN, URINE DIPSTICK

## 2024-04-26 MED ORDER — SODIUM CHLORIDE 0.9 % IV SOLN: 500.0000 mg/m2 | Freq: Once | INTRAVENOUS | Status: DC

## 2024-04-26 MED ORDER — SODIUM CHLORIDE 0.9 % IV SOLN
15.0000 mg/kg | Freq: Once | INTRAVENOUS | Status: AC
  Administered 2024-04-26: 800 mg via INTRAVENOUS
  Filled 2024-04-26: qty 32

## 2024-04-26 MED ORDER — SODIUM CHLORIDE 0.9 % IV SOLN
800.0000 mg | Freq: Once | INTRAVENOUS | Status: AC
  Administered 2024-04-26: 800 mg via INTRAVENOUS
  Filled 2024-04-26: qty 20

## 2024-04-26 MED ORDER — SODIUM CHLORIDE 0.9 % IV SOLN: 15.0000 mg/kg | Freq: Once | INTRAVENOUS | Status: DC

## 2024-04-26 MED ORDER — SODIUM CHLORIDE 0.9 % IV SOLN: INTRAVENOUS | Status: DC

## 2024-04-26 MED ORDER — METHYLPREDNISOLONE 4 MG PO TBPK: ORAL_TABLET | ORAL | 0 refills | Status: DC

## 2024-04-26 MED ORDER — PROCHLORPERAZINE MALEATE 10 MG PO TABS
10.0000 mg | ORAL_TABLET | Freq: Once | ORAL | Status: AC
  Administered 2024-04-26: 10 mg via ORAL
  Filled 2024-04-26: qty 1

## 2024-04-26 NOTE — Telephone Encounter (Signed)
 Left message on machine for patient to call back.

## 2024-04-26 NOTE — Progress Notes (Signed)
 Patient unable to provide urine sample for testing today. Cassie H, PA-C notified. Order to proceed today without urine protein result given. Jessica P. Rph made aware.  RN asked patient to hydrate very well prior to next appointment and be ready to provide a sample upon arrival. Patient verbalizes understanding.

## 2024-04-26 NOTE — Progress Notes (Addendum)
 Per Cassie, PA, holding bevacizumab  today with urine protein of 300 and okay to adjust weight basis in treatment plan due to weight loss. Pemetrexed  800 mg to be given today.  Harlene Nasuti, PharmD Oncology Infusion Pharmacist 04/26/2024 1:11 PM  ADDENDUM: Patient did not provide a urine sample today. There was a lab reporting error. Proceed with bevacizumab  treatment today per [place], PA.  Harlene Nasuti, PharmD Oncology Infusion Pharmacist 04/26/2024 1:56 PM

## 2024-04-28 ENCOUNTER — Encounter: Payer: Self-pay | Admitting: Internal Medicine

## 2024-05-01 ENCOUNTER — Other Ambulatory Visit: Payer: Self-pay | Admitting: Internal Medicine

## 2024-05-01 MED ORDER — LORAZEPAM 0.5 MG PO TABS: ORAL_TABLET | ORAL | 0 refills | Status: DC

## 2024-05-02 ENCOUNTER — Encounter: Payer: Self-pay | Admitting: Internal Medicine

## 2024-05-04 ENCOUNTER — Ambulatory Visit (HOSPITAL_COMMUNITY)
Admission: RE | Admit: 2024-05-04 | Discharge: 2024-05-04 | Disposition: A | Discharge status: Home / Self Care | Source: Ambulatory Visit | Attending: Physician Assistant | Admitting: Physician Assistant

## 2024-05-04 DIAGNOSIS — C3491 Malignant neoplasm of unspecified part of right bronchus or lung: Secondary | ICD-10-CM | POA: Diagnosis present

## 2024-05-04 DIAGNOSIS — I2510 Atherosclerotic heart disease of native coronary artery without angina pectoris: Secondary | ICD-10-CM | POA: Insufficient documentation

## 2024-05-04 DIAGNOSIS — C7951 Secondary malignant neoplasm of bone: Secondary | ICD-10-CM | POA: Insufficient documentation

## 2024-05-04 DIAGNOSIS — J439 Emphysema, unspecified: Secondary | ICD-10-CM | POA: Insufficient documentation

## 2024-05-04 DIAGNOSIS — C771 Secondary and unspecified malignant neoplasm of intrathoracic lymph nodes: Secondary | ICD-10-CM | POA: Diagnosis not present

## 2024-05-04 DIAGNOSIS — I700 Atherosclerosis of aorta: Secondary | ICD-10-CM | POA: Insufficient documentation

## 2024-05-04 DIAGNOSIS — C781 Secondary malignant neoplasm of mediastinum: Secondary | ICD-10-CM | POA: Diagnosis not present

## 2024-05-04 LAB — GLUCOSE, CAPILLARY: Glucose-Capillary: 74 mg/dL (ref 70–99)

## 2024-05-04 MED ORDER — FLUDEOXYGLUCOSE F - 18 (FDG) INJECTION
6.0000 | Freq: Once | INTRAVENOUS | Status: AC
  Administered 2024-05-04: 5.81 via INTRAVENOUS

## 2024-05-17 ENCOUNTER — Encounter: Payer: Self-pay | Admitting: Internal Medicine

## 2024-05-17 ENCOUNTER — Inpatient Hospital Stay: Attending: Internal Medicine

## 2024-05-17 ENCOUNTER — Telehealth: Payer: Self-pay

## 2024-05-17 ENCOUNTER — Other Ambulatory Visit (HOSPITAL_COMMUNITY): Payer: Self-pay

## 2024-05-17 ENCOUNTER — Telehealth: Payer: Self-pay | Admitting: Pharmacist

## 2024-05-17 ENCOUNTER — Inpatient Hospital Stay

## 2024-05-17 ENCOUNTER — Inpatient Hospital Stay: Admitting: Internal Medicine

## 2024-05-17 VITALS — BP 122/76 | HR 73 | Temp 97.8°F | Resp 17 | Ht 68.0 in | Wt 123.1 lb

## 2024-05-17 DIAGNOSIS — C7931 Secondary malignant neoplasm of brain: Secondary | ICD-10-CM | POA: Diagnosis not present

## 2024-05-17 DIAGNOSIS — Z923 Personal history of irradiation: Secondary | ICD-10-CM | POA: Diagnosis not present

## 2024-05-17 DIAGNOSIS — C771 Secondary and unspecified malignant neoplasm of intrathoracic lymph nodes: Secondary | ICD-10-CM | POA: Insufficient documentation

## 2024-05-17 DIAGNOSIS — Z79899 Other long term (current) drug therapy: Secondary | ICD-10-CM | POA: Insufficient documentation

## 2024-05-17 DIAGNOSIS — C3411 Malignant neoplasm of upper lobe, right bronchus or lung: Secondary | ICD-10-CM | POA: Diagnosis not present

## 2024-05-17 DIAGNOSIS — C3491 Malignant neoplasm of unspecified part of right bronchus or lung: Secondary | ICD-10-CM | POA: Diagnosis not present

## 2024-05-17 DIAGNOSIS — C782 Secondary malignant neoplasm of pleura: Secondary | ICD-10-CM | POA: Diagnosis not present

## 2024-05-17 DIAGNOSIS — Z86718 Personal history of other venous thrombosis and embolism: Secondary | ICD-10-CM | POA: Insufficient documentation

## 2024-05-17 DIAGNOSIS — Z9221 Personal history of antineoplastic chemotherapy: Secondary | ICD-10-CM | POA: Insufficient documentation

## 2024-05-17 DIAGNOSIS — N400 Enlarged prostate without lower urinary tract symptoms: Secondary | ICD-10-CM | POA: Diagnosis not present

## 2024-05-17 DIAGNOSIS — C7951 Secondary malignant neoplasm of bone: Secondary | ICD-10-CM | POA: Insufficient documentation

## 2024-05-17 DIAGNOSIS — E039 Hypothyroidism, unspecified: Secondary | ICD-10-CM | POA: Insufficient documentation

## 2024-05-17 DIAGNOSIS — I10 Essential (primary) hypertension: Secondary | ICD-10-CM | POA: Insufficient documentation

## 2024-05-17 DIAGNOSIS — I2510 Atherosclerotic heart disease of native coronary artery without angina pectoris: Secondary | ICD-10-CM | POA: Diagnosis not present

## 2024-05-17 LAB — CMP (CANCER CENTER ONLY)
ALT: 9 U/L (ref 0–44)
AST: 13 U/L — ABNORMAL LOW (ref 15–41)
Albumin: 3.5 g/dL (ref 3.5–5.0)
Alkaline Phosphatase: 95 U/L (ref 38–126)
Anion gap: 6 (ref 5–15)
BUN: 11 mg/dL (ref 6–20)
CO2: 33 mmol/L — ABNORMAL HIGH (ref 22–32)
Calcium: 10 mg/dL (ref 8.9–10.3)
Chloride: 97 mmol/L — ABNORMAL LOW (ref 98–111)
Creatinine: 0.43 mg/dL — ABNORMAL LOW (ref 0.61–1.24)
GFR, Estimated: >60 mL/min (ref >60)
Glucose, Bld: 151 mg/dL — ABNORMAL HIGH (ref 70–99)
Potassium: 4.1 mmol/L (ref 3.5–5.1)
Sodium: 136 mmol/L (ref 135–145)
Total Bilirubin: 0.3 mg/dL (ref 0.0–1.2)
Total Protein: 6.9 g/dL (ref 6.5–8.1)

## 2024-05-17 LAB — CBC WITH DIFFERENTIAL (CANCER CENTER ONLY)
Abs Immature Granulocytes: 0.07 K/uL (ref 0.00–0.07)
Basophils Absolute: 0 K/uL (ref 0.0–0.1)
Basophils Relative: 0 %
Eosinophils Absolute: 0 K/uL (ref 0.0–0.5)
Eosinophils Relative: 0 %
HCT: 33 % — ABNORMAL LOW (ref 39.0–52.0)
Hemoglobin: 10.9 g/dL — ABNORMAL LOW (ref 13.0–17.0)
Immature Granulocytes: 1 %
Lymphocytes Relative: 2 %
Lymphs Abs: 0.3 K/uL — ABNORMAL LOW (ref 0.7–4.0)
MCH: 31.5 pg (ref 26.0–34.0)
MCHC: 33 g/dL (ref 30.0–36.0)
MCV: 95.4 fL (ref 80.0–100.0)
Monocytes Absolute: 0.7 K/uL (ref 0.1–1.0)
Monocytes Relative: 6 %
Neutro Abs: 11.6 K/uL — ABNORMAL HIGH (ref 1.7–7.7)
Neutrophils Relative %: 91 %
Platelet Count: 287 K/uL (ref 150–400)
RBC: 3.46 MIL/uL — ABNORMAL LOW (ref 4.22–5.81)
RDW: 14.3 % (ref 11.5–15.5)
WBC Count: 12.7 K/uL — ABNORMAL HIGH (ref 4.0–10.5)
nRBC: 0 % (ref 0.0–0.2)

## 2024-05-17 LAB — TOTAL PROTEIN, URINE DIPSTICK: Protein, ur: NEGATIVE mg/dL

## 2024-05-17 MED ORDER — BRIGATINIB 90 & 180 MG PO TBPK
ORAL_TABLET | ORAL | 0 refills | Status: DC
  Filled 2024-05-18: qty 30, 30d supply, fill #0

## 2024-05-17 NOTE — Telephone Encounter (Signed)
 Oral Oncology Patient Advocate Encounter   Received notification that prior authorization for [name] ([name] 90 & 180mg  pack) is required.   PA submitted on 05/17/24 Key [id number] Status is pending     Lucie Lamer, CPhT [place]  [facility] Oncology Pharmacy Patient Advocate Specialist [address] [place] Phone: [phone]  Fax: [phone] [email or web address]

## 2024-05-17 NOTE — Progress Notes (Signed)
 [facility] [phone]   [phone]  OFFICE PROGRESS NOTE  [name], MD [address] [place] [address]  DIAGNOSIS:  Metastatic non-small cell lung cancer initially diagnosed as stage IIIB (T1c, N3, M0) non-small cell lung cancer, adenocarcinoma presented with right upper lobe lung mass in addition to mediastinal and bilateral supraclavicular lymphadenopathy diagnosed in January 2020.  He had evidence of disease recurrence in June 2021 with adenopathy in the subcarinal, level 3 left neck lymph node, and lymph node at the curious of the diaphragm and in the upper abdomen.   PD-L1: 10%   Guardant 360 molecular studies showed no actionable mutation   Foundation One Testing:   Biomarker Findings Microsatellite status - MS-Stable Tumor Mutational Burden - 4 Muts/Mb Genomic Findings For a complete list of the genes assayed, please refer to the Appendix. ALK EML4-ALK fusion (Variant 2) SMARCB1 R377C CTNNB1 S45P CDKN2A/B CDKN2B loss, CDKN2A loss CXCR4 W106* TP53 splice site 672G>A 7 Disease relevant genes with no reportable alterations: BRAF, EGFR, ERBB2, KRAS, MET, RET, ROS1   PRIOR THERAPY: 1) Concurrent chemoradiation with weekly carboplatin  for AUC of 2 and paclitaxel  45 mg/M2.  Status post 6 cycles.  Last dose was given on October 10, 2018 with stable disease. 2) Radiation treatment to the enlarging right cervical lymph nodes under the care of Dr. Shannon. First treatment 03/06/2019. Last treatment scheduled on 04/13/2019 3) Consolidation treatment with immunotherapy with Imfinzi  10 mg/KG every 2 weeks.  First dose November 17, 2018.  Status post 26 cycles. 4) Systemic chemotherapy with carboplatin  for an AUC of 5, Alimta  500 mg/m2, and Keytruda  200 mg IV every 3 weeks. First dose expected on 02/14/20. Status post 1 cycle.  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 5) SBRT to the left lower lobe lung nodule. 6) [name] 600 mg p.o. twice daily.  He started the first dose on March 08, 2020.   Status post 44 months of treatment.  Discontinued in March 2025 due to disease progression 7) [name] 100 mg daily.  Discontinued in April 2025 due to severe intolerance due to mental status changes/confusion  8) Radiation to the bilateral cervical lymph nodes under the care of Dr. Dewey last day expected on 11/17/2023   9) Palliative systemic chemotherapy with carboplatin  for an AUC of 4 (dose reduced), Alimta  500 mg/m (dose reduced), and [name]  IV every 3 weeks.  First dose expected on 12/23/2023.  Status post 7 cycles.  CURRENT THERAPY: [name] (Alungbrig) 90 mg p.o. daily for 7 days followed by 180 mg p.o. daily after the first week.  INTERVAL HISTORY: Brian [name] 63 y.o. male returns to the clinic today for follow-up visit accompanied by his wife.  Discussed the use of AI scribe software for clinical note transcription with the patient, who gave verbal consent to proceed.  History of Present Illness Brian [name] is a 63 year old male with metastatic non-small cell lung cancer who presents for evaluation with repeat PET scan. He is accompanied by his wife.  He has a history of non-small cell lung cancer, adenocarcinoma with positive ALK gene translocation, initially diagnosed as stage IIIB in January 2020. Disease progression was noted in June 2021. His treatment history includes concurrent chemoradiation followed by immunotherapy, and alectinib for approximately 44 months until it was discontinued due to disease progression. [name]  was attempted but  not tolerated, leading to its discontinuation. He received palliative radiotherapy to bilateral cervical lymph nodes and started palliative systemic chemotherapy with carboplatin , Alimta , and [name]  every three weeks for six cycles, followed by one cycle of maintenance  treatment with Alimta  and [name] .  He feels 'okay' with a slight increase in energy and improved appetite, having gained approximately two pounds since the last visit. He notes some difficulty and pain with swallowing, particularly with liquids, but not consistently with solids.  No chest pain. Breathing is generally okay. He acknowledges swallowing issues, describing them as both difficulty and pain, primarily with liquids.    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  MEDICATIONS:  Current Outpatient Medications  Medication Sig Dispense Refill   acetaminophen  (TYLENOL ) 500 MG tablet Take 1,000 mg by mouth every 6 (six) hours as needed (for pain).     albuterol  (VENTOLIN  HFA) 108 (90 Base) MCG/ACT inhaler INHALE 2 PUFFS INTO THE LUNGS EVERY 4 (FOUR) HOURS AS NEEDED FOR WHEEZING OR SHORTNESS OF BREATH. 6.7 each 4   amLODipine  (NORVASC ) 5 MG tablet Take 0.5 tablets (2.5 mg total) by mouth in the morning. 30 tablet 2   atorvastatin  (LIPITOR) 20 MG tablet Take 20 mg by mouth in the morning.     cyclobenzaprine  (FLEXERIL ) 5 MG tablet Take 1 tablet (5 mg total) by mouth 3 (three) times daily as needed for muscle spasms. 30 tablet 0   dexamethasone  (DECADRON ) 4 MG tablet Take 1 tablet twice a day the day before, the day of, and  the day after chemotherapy. 40 tablet 2   DULoxetine  (CYMBALTA ) 60 MG capsule Take 1 capsule (60 mg total) by mouth daily. 90 capsule 3   fluticasone  (FLONASE ) 50 MCG/ACT nasal spray Place 1 spray into both nostrils daily. 1 mL 2   folic acid  (FOLVITE ) 1 MG tablet TAKE 1 TABLET BY MOUTH EVERY DAY 30 tablet 2   gabapentin  (NEURONTIN ) 600 MG tablet Take 600 mg by mouth.     levothyroxine  (SYNTHROID ) 100 MCG tablet Take 100 mcg by mouth daily before breakfast.     LORazepam  (ATIVAN ) 0.5 MG tablet 1 tablet p.o. 30 minutes before PET scan repeat x 1 if needed 2 tablet 0   MIRALAX  17 GM/SCOOP powder Take 17 g by mouth daily as needed for mild constipation.     mirtazapine  (REMERON ) 15 MG tablet TAKE 1 TABLET BY MOUTH EVERYDAY AT BEDTIME 90 tablet 1   prochlorperazine  (COMPAZINE ) 10 MG tablet Take 1 tablet (10 mg total) by mouth every 6 (six) hours as needed. 30 tablet 2   TRELEGY ELLIPTA  100-62.5-25 MCG/ACT AEPB INHALE 1 PUFF BY MOUTH ONCE DAILY 180 each 3   venlafaxine  (EFFEXOR ) 37.5 MG tablet Take 37.5 mg by mouth in  the morning.     methylPREDNISolone  (MEDROL  DOSEPAK) 4 MG TBPK tablet Use as instructed (Patient not taking: Reported on 02/23/2024) 21 tablet 0   methylPREDNISolone  (MEDROL  DOSEPAK) 4 MG TBPK tablet Use as instructed 21 tablet 0   No current facility-administered medications for this visit.    SURGICAL HISTORY:  Past Surgical History:  Procedure Laterality Date   COLONOSCOPY     FEMUR FRACTURE SURGERY Left 1969   INGUINAL HERNIA REPAIR Right 06/24/2020   Procedure: RIGHT OPEN INGUINAL HERNIA REPAIR WITH MESH;  Surgeon: Kinsinger, Herlene Righter, MD;  Location: [facility];  Service: General;  Laterality: Right;   IR IVC FILTER PLMT / S&I PORTER GUID/MOD SED  03/04/2020   IR IVC FILTER RETRIEVAL / S&I PORTER GUID/MOD SED  08/15/2020   IR RADIOLOGIST EVAL & MGMT  08/06/2020   SPINAL CORD STIMULATOR INSERTION N/A 10/05/2023   Procedure: CERVICAL SPINAL CORD STIMULATOR PLACEMENT;  Surgeon:  Claudene Penne ORN, MD;  Location: [facility] ORS;  Service: Neurosurgery;  Laterality: N/A;   THORACENTESIS Right 12/10/2023   Procedure: THORACENTESIS;  Surgeon: Kara Dorn NOVAK, MD;  Location: [facility];  Service: Pulmonary;  Laterality: Right;    REVIEW OF SYSTEMS:  Constitutional: positive for fatigue Eyes: negative Ears, nose, mouth, throat, and face: negative Respiratory: positive for dyspnea on exertion Cardiovascular: negative Gastrointestinal: negative Genitourinary:negative Integument/breast: negative Hematologic/lymphatic: negative Musculoskeletal:positive for muscle weakness Neurological: negative Behavioral/Psych: negative Endocrine: negative Allergic/Immunologic: negative   PHYSICAL EXAMINATION: General appearance: alert, cooperative, fatigued, and no distress Head: Normocephalic, without obvious abnormality, atraumatic Neck: no adenopathy, no JVD, supple, symmetrical, trachea midline, and thyroid  not enlarged, symmetric, no tenderness/mass/nodules Lymph nodes: Cervical, supraclavicular, and axillary nodes normal. Resp: clear to auscultation bilaterally Back: symmetric, no curvature. ROM normal. No CVA tenderness. Cardio: regular rate and rhythm, S1, S2 normal, no murmur, click, rub or gallop GI: soft, non-tender; bowel sounds normal; no masses,  no organomegaly Extremities: extremities normal, atraumatic, no cyanosis or edema Neurologic: Alert and oriented X 3, normal strength and tone. Normal symmetric reflexes. Normal coordination and gait  ECOG PERFORMANCE STATUS: 1 - Symptomatic but completely ambulatory  Blood pressure 122/76, pulse 73, temperature 97.8 F (36.6 C), resp. rate 17, height 5' 8 (1.727 m), weight 123 lb 1.6 oz (55.8 kg), SpO2 97%.  LABORATORY DATA: Lab Results  Component Value Date   WBC 12.7 (H) 05/17/2024   HGB 10.9 (L) 05/17/2024   HCT 33.0 (L) 05/17/2024   MCV 95.4 05/17/2024   PLT 287 05/17/2024      Chemistry      Component Value  Date/Time   NA 136 05/17/2024 1100   K 4.1 05/17/2024 1100   CL 97 (L) 05/17/2024 1100   CO2 33 (H) 05/17/2024 1100   BUN 11 05/17/2024 1100   CREATININE 0.43 (L) 05/17/2024 1100      Component Value Date/Time   CALCIUM  10.0 05/17/2024 1100   ALKPHOS 95 05/17/2024 1100   AST 13 (L) 05/17/2024 1100   ALT 9 05/17/2024 1100   BILITOT 0.3 05/17/2024 1100       RADIOGRAPHIC STUDIES: NM PET Image Restag (PS) Skull Base To Thigh Result Date: 05/06/2024 EXAM: PET AND CT SKULL BASE TO MID THIGH 05/04/2024 02:35:24 PM TECHNIQUE: RADIOPHARMACEUTICAL: 5.81 mCi F-18 FDG Uptake time 60 minutes. Glucose level 74 mg/dl. PET imaging was acquired from the base of the skull to the mid thighs. Non-contrast enhanced computed tomography was obtained for attenuation correction and anatomic localization. COMPARISON: Chest abdomen and pelvic CTs of  04/25/2024 and most recent PET of 10/22/2023. CLINICAL HISTORY: Non-small cell lung cancer (NSCLC), metastatic, assess treatment response; Assess the new consolidation in the left lower lung that showed on recent CT. 5.81 mCi F18 FDG IV Lt Wrist @ 1315 MM; BG - 74; PET Scan for adenocarcinoma of right lung, stage 4; non-small cell lung cancer, metastatic, assess treatment response; Not diabetic; nerve stimulator placement in back roughly 6 months ago; no biopsies; chemo on 04/26/2024; has nerve stimulator but no other OBMD; EOV. FINDINGS: HEAD AND NECK: Resolved low cervical nodal hypermetabolism. No cervical adenopathy. Cervical spinal stimulator. CHEST: The dominant paraesophageal esophageal mass is hypermetabolic. 2.1 x 2.7 cm on recent diagnostic CT and SUV 34.1 on image 51 / 4. Compare SUV 23.8 and significantly smaller on the prior PET. Bilateral hilar hypermetabolism is new and without correlate adenopathy. Exam on the left at SUV 8.3. The left lower lobe area of presumed nodular consolidation on the prior diagnostic chest CT is decreased, with central cavitation today.  Example 2.0 cm in SUV 2.8 on image 80 / 4. New subtle focus of left pleural hypermetabolism with possible pleural thickening. SUV 2.7 on image 72 / 4. Loculated small right pleural effusion is similar to prior chest CT. The left-sided effusion has resolved. Aortic and coronary artery atherosclerosis. Tiny pulmonary nodules were detailed on the prior chest CT and are below PET resolution. ABDOMEN AND PELVIS: No metabolically active intraperitoneal mass. No metabolically active lymphadenopathy. Physiologic activity within the gastrointestinal and genitourinary systems. Bilateral low density renal lesions are likely cysts and do not warrant specific imaging follow up. Normal adrenal glands. Mild Prostatomegaly. BONES AND SOFT TISSUE: Multiple new foci of lower cervical osseous hypermetabolism. Example involving right-sided lower cervical facets. An index lesion within the approximately T2 vertebral body measures SUV 17.4 , CT occult. Cervical spinal stimulator. IMPRESSION: 1. Compared to the prior pet, disease progression as evidenced by enlargement and increased hypermetabolism within a mediastinal mass, new bilateral hilar nodal metastasis. Equivocal isolated left pleural metastasis. 2.  the previously described low cervical nodal metastasis have resolved. 3.  new small volume cervical / thoracic vertebral osseous metastasis 4. No subdiaphragmatic metastasis identified. 5. the left lower lobe presumed nodular consolidation is improved with new cavitation, likely infectious / inflammatory. 6. Resolved left pleural effusion. Persistent loculated small right pleural effusion. 7. incidental findings, including: Aortic atherosclerosis (icd10-i70.0), coronary artery atherosclerosis, and emphysema (icd10-j43.9). Electronically signed by: Rockey Kilts MD 05/06/2024 10:35 AM EDT RP Workstation: [id number]   CT CHEST ABDOMEN PELVIS W CONTRAST Result Date: 04/25/2024 CLINICAL DATA:  Stage IV adenocarcinoma of right lung. *  Tracking Code: BO * EXAM: CT CHEST, ABDOMEN, AND PELVIS WITH CONTRAST TECHNIQUE: Multidetector CT imaging of the chest, abdomen and pelvis was performed following the standard protocol during bolus administration of intravenous contrast. RADIATION DOSE REDUCTION: This exam was performed according to the departmental dose-optimization program which includes automated exposure control, adjustment of the mA and/or kV according to patient size and/or use of iterative reconstruction technique. CONTRAST:  OMNIPAQUE  IOHEXOL  300 MG/ML  SOLN COMPARISON:  02/16/2024 FINDINGS: CT CHEST FINDINGS Cardiovascular: Aortic atherosclerosis. Normal heart size, without pericardial effusion. Lad coronary artery calcification. No central pulmonary embolism, on this non-dedicated study. Mediastinum/Nodes: No hilar adenopathy. Soft tissue fullness centered about the upper thoracic esophagus measures 2.1 x 2.7 cm on 19/2 versus 2.2 x 2.0 cm on the prior exam. The upstream esophagus is mildly dilated with debris within including on 15/2, similar. Lungs/Pleura: Loculated small volume right-sided  pleural fluid is minimally decreased. Small left pleural effusion is increased. Volume loss within the right lower lobe is similar, likely secondary to mass effect from the loculated right-sided pleural fluid, including the right major fissure. Left upper lobe 3 mm pulmonary nodule on 36/6 measured 2 mm on the prior. Left lower lobe 5 mm nodule on 67/6 measured 4 mm on the prior. There may be a 2 mm superior segment left lower lobe pulmonary nodule on 51/6, likely similar. New lateral, subpleural left lower lobe probable area of somewhat rounded consolidation, including at 3.6 cm on 95/6. Suspect a 3 mm lateral right lower lobe pulmonary nodule on 59/6, new. Musculoskeletal: Included within the abdomen pelvic section. CT ABDOMEN PELVIS FINDINGS Hepatobiliary: Mild limitations secondary to patient arm position and artifact from dorsal spinal  stimulator battery throughout the abdomen. Normal liver. Normal gallbladder, without biliary ductal dilatation. Pancreas: Mild pancreatic atrophy. No duct dilatation or acute inflammation. Spleen: Normal in size, without focal abnormality. Adrenals/Urinary Tract: Normal adrenal glands. An upper pole left renal 1.9 cm fluid density lesion is likely a cyst and does not warrant imaging follow-up. Normal right kidney. No hydronephrosis. Normal urinary bladder. Stomach/Bowel: Normal stomach, without wall thickening. Colonic stool burden suggests constipation. Normal terminal ileum. Normal small bowel. Vascular/Lymphatic: Aortic atherosclerosis. No abdominopelvic adenopathy. Reproductive: Mild prostatomegaly. Other: No significant free fluid. No evidence of omental or peritoneal disease. Musculoskeletal: Accentuation of expected thoracic kyphosis. Prominent disc bulge at L4-5. IMPRESSION: CT CHEST IMPRESSION 1. Soft tissue fullness within the posterior mediastinum, either compressing or involving the upper esophagus. This is felt to be mildly progressive since 02/16/2024. This either represents nodal metastasis or an esophageal primary. This causes partial esophageal obstruction, similar to the prior. 2. Tiny pulmonary nodules, felt to be minimally enlarged. Suspicious for pulmonary metastasis. 3. Decreased small loculated right-sided pleural effusion and increased small left pleural effusion. 4. New lateral left lower lobe rounded opacity, favored to represent either infection or in the appropriate clinical setting infarct (no large central pulmonary embolism identified). Correlate with infectious symptoms. 5. Coronary artery atherosclerosis. Aortic Atherosclerosis (ICD10-I70.0). CT ABDOMEN AND PELVIS IMPRESSION 1. No acute process or evidence of metastatic disease in the abdomen or pelvis. 2.  Possible constipation. These results will be called to the ordering clinician or representative by the Radiologist Assistant,  and communication documented in the PACS or [employer]. Electronically Signed   By: Rockey Kilts M.D.   On: 04/25/2024 17:33    ASSESSMENT AND PLAN: This is a very pleasant 63 years old white male with metastatic non-small cell lung cancer, adenocarcinoma with positive ALK gene translocation that was initially diagnosed as a stage IIIb in January 2020 with evidence for disease recurrence and metastasis in June 2021. He was initially treated with a course of concurrent chemoradiation followed by consolidation treatment with immunotherapy and then systemic chemotherapy with 1 cycle of carboplatin , Alimta  and Keytruda  every 4 his tumor was found to have ALK gene translocation. At that time he was switched to treatment with alectinib since August 2021 status post 44 months of treatment discontinued in March 2025 secondary to disease progression. The patient started treatment with [name]  100 mg p.o. daily but he was unable to take it more than few weeks and this discontinued secondary to significant toxicity with mental status change and confusion. Is here today for evaluation and discussion of other treatment options.  He is currently on treatment with systemic chemotherapy with carboplatin  for AUC of 4 and Alimta  400 Mg/M2  in addition to [name]  15 Mg/KG every 3 weeks.  Status post 7 cycles.  Starting from cycle #7 he has been on maintenance treatment with Alimta  and [name]  every 3 weeks. He has been tolerating the treatment fairly well. His recent CT scan of the chest as well as the PET scan showed evidence for disease progression as evidenced by enlargement and increased hypermetabolism within mediastinal mass and new bilateral hilar nodal metastasis and equivocal isolated left pleural metastasis.  There was also new small volume cervical/thoracic vertebral osseous metastasis. I had a lengthy discussion with the patient and his wife today about his current disease condition and treatment  options. Assessment and Plan Assessment & Plan Metastatic non-small cell lung adenocarcinoma with ALK gene translocation Initially diagnosed as stage 3B in January 2020 with progression in June 2021. Recent PET scan shows improvement in cervical lymph nodes but increased size and metabolic activity in mediastinal lymph nodes. Current chemotherapy regimen is insufficient. Previous treatments included effective alectinib until progression and intolerable lorlatinib . [name] is considered suitable with manageable initial respiratory side effects. Ensacove newly approved FDA drug for ALK gene translocation is less effective with many adverse effects. - Initiate brigatinib at 90 mg daily for one week, then increase to 180 mg daily if tolerated. - Discontinue current chemotherapy regimen. - Monitor for respiratory symptoms; advise hospitalization if severe. - Schedule follow-up in one week to assess tolerance to brigatinib and adjust dosage if necessary.  Dysphagia secondary to mediastinal lymphadenopathy from metastatic lung cancer Characterized by difficulty and pain with swallowing, more pronounced with liquids. Likely secondary to enlarged and active mediastinal lymph nodes as seen on recent PET scan.   The patient voices understanding of current disease status and treatment options and is in agreement with the current care plan.  All questions were answered. The patient knows to call the clinic with any problems, questions or concerns. We can certainly see the patient much sooner if necessary.  The total time spent in the appointment was 35 minutes including review of chart and various tests results, discussions about plan of care and coordination of care plan .   Disclaimer: This note was dictated with voice recognition software. Similar sounding words can inadvertently be transcribed and may not be corrected upon review.

## 2024-05-17 NOTE — Telephone Encounter (Signed)
 Oral Oncology Pharmacist Encounter  Received new prescription for [name] (brigatinib) for the treatment of metastatic NSCLC, ALK mutated, planned duration until disease progression or unacceptable drug toxicity.  CBC w/ Diff and CMP from 05/17/24 assessed, noted patient with glucose of 151 mg/dL - this should be monitored during treatment due to risk of hyperglycemia secondary to [name]. No baseline dose adjustments required at this time. Prescription dose and frequency assessed for appropriateness.  Current medication list in Epic reviewed, DDIs with [name] identified: Category C drug-drug interaction between [name] and Amlodipine  -  [name] may decrease antihypertensive effects of amlodipine  - monitor patient for dose adjustments of amlodpine for HTN while on therapy. Patient's BP in clinic today is 122/76 mmHg. No changes in therapy warranted at this time.   Evaluated chart and no patient barriers to medication adherence noted.   Patient agreement for treatment documented in MD note on 05/17/24.  Prescription has been e-scribed to the [facility] for benefits analysis and approval.  Oral Oncology Clinic will continue to follow for insurance authorization, copayment issues, initial counseling and start date.  Asberry Macintosh, PharmD, BCPS, BCOP Hematology/Oncology Clinical Pharmacist [phone] 05/17/2024 1:02 PM

## 2024-05-17 NOTE — Progress Notes (Signed)
 DISCONTINUE ON PATHWAY REGIMEN - Non-Small Cell Lung     Cycles 1 through up to 6: A cycle is every 21 days:     Bevacizumab -xxxx      Pemetrexed       Carboplatin    **Always confirm dose/schedule in your pharmacy ordering system**  PRIOR TREATMENT: [id number]: Carboplatin  AUC=5 + Pemetrexed  500 mg/m2 + Bevacizumab  15 mg/kg q21 Days x 4-6 Cycles  START OFF PATHWAY REGIMEN - Non-Small Cell Lung   [id number]:[name] 90/180 mg PO Daily q28 Days:   Cycle 1: A cycle is 28 days:     [name]      [name]    Cycles 2 and beyond: A cycle is 28 days:     [name]   **Always confirm dose/schedule in your pharmacy ordering system**  Patient Characteristics: Stage IV Metastatic, Nonsquamous, Molecular Analysis Completed, Molecular Alteration Present and Eligible for Molecular Targeted Therapy, Second Line - Molecular Targeted Therapy, ALK Fusion/Rearrangement Positive Therapeutic Status: Stage IV Metastatic Histology: Nonsquamous Cell Broad Molecular Profiling Status: Animal nutritionist Analysis Results: Alteration Present and Eligible for Molecular Targeted Therapy Molecular Alteration Present: ALK Fusion/Rearrangement Positive Molecular Targeted Line of Therapy: Second Scientist, forensic Therapy Intent of Therapy: Non-Curative / Palliative Intent, Discussed with Patient

## 2024-05-18 ENCOUNTER — Other Ambulatory Visit: Payer: Self-pay

## 2024-05-18 ENCOUNTER — Other Ambulatory Visit (HOSPITAL_COMMUNITY): Payer: Self-pay

## 2024-05-18 NOTE — Progress Notes (Signed)
 Specialty Pharmacy Initial Fill Coordination Note  Brian [name] is a 63 y.o. male contacted today regarding refills of specialty medication(s) [name] ([name]) .  Patient requested [facility] at [facility] at [place]  on 05/19/24   Medication will be filled on 05/18/24.   Patient is aware of 0.00 copayment.   Lucie Lamer, CPhT [place]  [facility] Specialty Pharmacy Services Oncology Pharmacy Patient Advocate Specialist [facility] [place] Phone: [phone]  Fax: [phone] [email or web address]

## 2024-05-18 NOTE — Telephone Encounter (Signed)
 Oral Chemotherapy Pharmacist Encounter  I spoke with patient and patient's wife for overview of: [name] (brigatinib) for the treatment of ALK-rearrangement positive, metastatic NSCLC, planned duration until disease progression or unacceptable toxicity.  Treatment goal: Palliative  Counseled patient on administration, dosing, side effects, monitoring, drug-food interactions, safe handling, storage, and disposal.  [name] will be initiated on dose titration schedule per manufacturer recommendations. Early phase clinical trials noted patients with increase in risk of pulmonary adverse events with early onset (usually within 24-48 hours) at higher starting doses.  Patient will take [name] 90 mg tablets, 1 tablet (90 mg) by mouth once daily with or without food for 7 days.  If tolerated, patient will increase to [name] 180 mg tablets, 1 tablet (180 mg) by mouth once daily with or without food.  This is the target daily dose of [name].  If [name] is interrupted for 14 days or longer for reasons other than adverse reactions, resume treatment at 90 mg once daily for 7 days before increasing to the previously tolerated dose.  Patient knows to avoid grapefruit/grapefruit juice while on [name].  [name] start date: 05/22/24  Adverse effects include but are not limited to: bradycardia, hypertension, cough, muscle pain, fatigue, nausea, diarrhea, visual disturbances, headache, skin rash, increased blood sugar, hepatotoxicity  Reviewed with patient importance of keeping a medication schedule and plan for any missed doses. No barriers to medication adherence identified.  Distress thermometer flowsheet: Distress thermometer not completed during telephone call as patient has been on previous lines of therapy.   Communication and Learning Assessment Primary learner: Patient Barriers to learning: No barriers Preferred language: English Learning preferences: Listening Reading  All  questions answered.  Brian [name] voiced understanding and appreciation.   Medication education handout placed in mail for patient. Patient knows to call the office with questions or concerns. Oral Chemotherapy Clinic phone number provided to patient.   Brian [name], PharmD, BCPS, BCOP Hematology/Oncology Clinical Pharmacist [phone] 05/18/2024 12:18 PM

## 2024-05-18 NOTE — Telephone Encounter (Signed)
 Oral Oncology Patient Advocate Encounter  Prior Authorization for [name] ([name] 90 & 180mg  pack)  has been approved.    PA# [id number] Effective dates: 05/18/24 through 05/18/25  Patients co-pay is $0.00.    Lucie Lamer, CPhT [place]  [facility] Specialty Pharmacy Services Oncology Pharmacy Patient Advocate Specialist [facility] [place] Phone: [phone]  Fax: [phone] [email or web address]

## 2024-05-18 NOTE — Progress Notes (Signed)
 Oral Chemotherapy Pharmacist Encounter  Patient was counseled under telephone encounter from 05/17/24.  Asberry Macintosh, PharmD, BCPS, BCOP Hematology/Oncology Clinical Pharmacist [facility] Oral Chemotherapy Navigation Clinics [phone] 05/18/2024 12:21 PM

## 2024-05-19 ENCOUNTER — Other Ambulatory Visit: Payer: Self-pay

## 2024-05-22 ENCOUNTER — Telehealth: Payer: Self-pay | Admitting: Internal Medicine

## 2024-05-22 NOTE — Telephone Encounter (Signed)
 Rescheduled appointments with the patient per the providers request. The patient is aware of the changes made.

## 2024-05-23 ENCOUNTER — Inpatient Hospital Stay: Admitting: Internal Medicine

## 2024-05-23 ENCOUNTER — Inpatient Hospital Stay

## 2024-05-26 ENCOUNTER — Encounter: Payer: Self-pay | Admitting: Internal Medicine

## 2024-05-26 NOTE — Progress Notes (Signed)
 [facility] OFFICE PROGRESS NOTE  [name], MD [address] [place] [address]  DIAGNOSIS:  Metastatic non-small cell lung cancer initially diagnosed as stage IIIB (T1c, N3, M0) non-small cell lung cancer, adenocarcinoma presented with right upper lobe lung mass in addition to mediastinal and bilateral supraclavicular lymphadenopathy diagnosed in January 2020.  He had evidence of disease recurrence in June 2021 with adenopathy in the subcarinal, level 3 left neck lymph node, and lymph node at the curious of the diaphragm and in the upper abdomen.   PD-L1: 10%   Guardant 360 molecular studies showed no actionable mutation   Foundation One Testing:   Biomarker Findings Microsatellite status - MS-Stable Tumor Mutational Burden - 4 Muts/Mb Genomic Findings For a complete list of the genes assayed, please refer to the Appendix. ALK EML4-ALK fusion (Variant 2) SMARCB1 R377C CTNNB1 S45P CDKN2A/B CDKN2B loss, CDKN2A loss CXCR4 W106* TP53 splice site 672G>A 7 Disease relevant genes with no reportable alterations: BRAF, EGFR, ERBB2, KRAS, MET, RET, ROS1  PRIOR THERAPY: 1) Concurrent chemoradiation with weekly carboplatin  for AUC of 2 and paclitaxel  45 mg/M2.  Status post 6 cycles.  Last dose was given on October 10, 2018 with stable disease. 2) Radiation treatment to the enlarging right cervical lymph nodes under the care of Dr. Shannon. First treatment 03/06/2019. Last treatment scheduled on 04/13/2019 3) Consolidation treatment with immunotherapy with Imfinzi  10 mg/KG every 2 weeks.  First dose November 17, 2018.  Status post 26 cycles. 4) Systemic chemotherapy with carboplatin  for an AUC of 5, Alimta  500 mg/m2, and Keytruda  200 mg IV every 3 weeks. First dose expected on 02/14/20. Status post 1 cycle.  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 5) SBRT to the left lower lobe lung  nodule. 6) [name] 600 mg p.o. twice daily.  He started the first dose on March 08, 2020.   Status post 44 months of treatment.  Discontinued in March 2025 due to disease progression 7) [name] 100 mg daily.  Discontinued in April 2025 due to severe intolerance due to mental status changes/confusion  8) Radiation to the bilateral cervical lymph nodes under the care of Dr. Dewey last day expected on 11/17/2023   9) Palliative systemic chemotherapy with carboplatin  for an AUC of 4 (dose reduced), Alimta  500 mg/m (dose reduced), and [name]  IV every 3 weeks.  First dose expected on 12/23/2023.  Status post 7 cycles.    CURRENT THERAPY: [name] (brigatinib) 90 mg by mouth p.o. daily. First dose on 05/22/24. If he tolerates this, then will increase to 180 mg tablets once daily with or without food.   INTERVAL HISTORY: Brian [name] 63 y.o. male returns to the clinic today for a follow-up visit accompanied by his wife.  The patient was last seen in the clinic on 05/17/2024 by Dr. Sherrod.  Unfortunately, the patient was found to have disease progression at that time.  Therefore his treatment was changed to [name].  He started this around 05/22/2024.  Thus far he has been tolerating it well without any appreciable side effects.   Today he started the 180 mg dose. His blood pressure is a little high this morning.  He does not check his blood pressure daily but has a cuff at home and plans to keep a log of his readings.  He reports a decrease in appetite that began before starting the new medication.  He is currently taking Remeron  (mirtazapine ) before bed. He is also on Cymbalta  and Effexor , prescribed by neurology and his family doctor, respectively.   He experiences labored breathing at times. He uses oxygen  during the day at a rate of 3 liters. No coughing, hemoptysis, chest pain, nausea, vomiting, diarrhea, or headaches. He reports normal constipation and some stomach  cramping, which he associates with Miralax  use.  His pulse is usually around 115 bpm since he started IV chemo a few months ago. He had an EKG performed at a prior appointment which did not show any arrhythmia.   He is here for evaluation and repeat blood work.   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  MEDICATIONS:  Current Outpatient Medications  Medication Sig Dispense Refill   acetaminophen  (TYLENOL ) 500 MG tablet Take 1,000 mg by mouth every 6 (six) hours as needed (for pain).     albuterol  (VENTOLIN  HFA) 108 (90 Base) MCG/ACT inhaler INHALE 2 PUFFS INTO THE LUNGS EVERY 4 (FOUR) HOURS AS NEEDED FOR WHEEZING OR SHORTNESS OF BREATH. 6.7 each 4   amLODipine  (NORVASC ) 5 MG tablet Take 0.5 tablets (2.5 mg total) by mouth in the morning. 30 tablet 2   atorvastatin  (LIPITOR) 20 MG tablet Take 20 mg by mouth in the morning.     brigatinib ([name]) 90 & 180 MG TBPK Take 90 mg po daily x 7 days, then 180 mg po daily x 23 day (based on supply). Take with or without food. Do not crush or chew tablets. 30 each 0   cyclobenzaprine  (FLEXERIL ) 5 MG tablet Take 1 tablet (5 mg total) by mouth 3 (three) times daily as needed for muscle spasms. 30 tablet 0   DULoxetine  (CYMBALTA ) 60 MG capsule Take 1 capsule (60 mg total) by mouth  daily. 90 capsule 3   fluticasone  (FLONASE ) 50 MCG/ACT nasal spray Place 1 spray into both nostrils daily. 1 mL 2   folic acid  (FOLVITE ) 1 MG tablet TAKE 1 TABLET BY MOUTH EVERY DAY 30 tablet 2   gabapentin  (NEURONTIN ) 600 MG tablet Take 600 mg by mouth.     levothyroxine  (SYNTHROID ) 100 MCG tablet Take 100 mcg by mouth daily before breakfast.     LORazepam  (ATIVAN ) 0.5 MG tablet 1 tablet p.o. 30 minutes before PET scan repeat x 1 if needed 2 tablet 0   MIRALAX  17 GM/SCOOP powder Take 17 g by mouth daily as needed for mild constipation.     mirtazapine  (REMERON ) 15 MG tablet TAKE 1 TABLET BY MOUTH EVERYDAY AT BEDTIME 90 tablet 1   prochlorperazine  (COMPAZINE ) 10 MG tablet Take 1 tablet (10 mg total) by mouth every 6 (six) hours as needed. 30 tablet 2   TRELEGY ELLIPTA  100-62.5-25 MCG/ACT AEPB INHALE  1 PUFF BY MOUTH ONCE DAILY 180 each 3   venlafaxine  (EFFEXOR ) 37.5 MG tablet Take 37.5 mg by mouth in the morning.     No current facility-administered medications for this visit.    SURGICAL HISTORY:  Past Surgical History:  Procedure Laterality Date   COLONOSCOPY     FEMUR FRACTURE SURGERY Left 1969   INGUINAL HERNIA REPAIR Right 06/24/2020   Procedure: RIGHT OPEN INGUINAL HERNIA REPAIR WITH MESH;  Surgeon: Kinsinger, Herlene Righter, MD;  Location: [facility] ORS;  Service: General;  Laterality: Right;   IR IVC FILTER PLMT / S&I PORTER GUID/MOD SED  03/04/2020   IR IVC FILTER RETRIEVAL / S&I PORTER GUID/MOD SED  08/15/2020   IR RADIOLOGIST EVAL & MGMT  08/06/2020   SPINAL CORD STIMULATOR INSERTION N/A 10/05/2023   Procedure: CERVICAL SPINAL CORD STIMULATOR PLACEMENT;  Surgeon: Claudene Penne ORN, MD;  Location: [facility] ORS;  Service: Neurosurgery;  Laterality: N/A;   THORACENTESIS Right 12/10/2023   Procedure: THORACENTESIS;  Surgeon: Kara Dorn NOVAK, MD;  Location: [facility] END[facility]COPY;  Service: Pulmonary;  Laterality: Right;    REVIEW OF SYSTEMS:   Review of Systems  Constitutional: Positive for weight loss.  Negative for appetite change, chills, fatigue, fever.  HENT:   Negative for mouth sores, nosebleeds, sore throat and trouble swallowing.   Eyes: Negative for eye problems and icterus.  Respiratory: Positive for occasional shortness of breath. Negative for cough, hemoptysis and wheezing.   Cardiovascular: Negative for chest pain and leg swelling.  Gastrointestinal: Positive for occasional constipation. Negative for abdominal pain, diarrhea, nausea and vomiting.  Genitourinary: Negative for bladder incontinence, difficulty urinating, dysuria, frequency and hematuria.   Musculoskeletal: Negative for back pain, gait problem, neck pain and neck stiffness.  Skin: Negative for itching and rash.  Neurological: Negative for dizziness, extremity weakness, gait problem, headaches, light-headedness and seizures.  Hematological: Negative for adenopathy. Does not bruise/bleed easily.  Psychiatric/Behavioral: Negative for confusion, depression and sleep disturbance. The patient is not nervous/anxious.     PHYSICAL EXAMINATION:  There were no vitals taken for this visit.  ECOG PERFORMANCE STATUS: 2  Physical Exam  Constitutional: Oriented to person, place, and time and chronically ill appearing male and in no distress.  HENT:  Head: Normocephalic and atraumatic.  Mouth/Throat: Oropharynx is clear and moist. No oropharyngeal exudate.  Eyes: Conjunctivae are normal. Right eye exhibits no discharge. Left eye exhibits no discharge. No scleral icterus.  Neck: Normal range of motion. Neck supple.  Cardiovascular: Tachycardic, regular rhythm, normal heart sounds and intact distal pulses.   Pulmonary/Chest: Effort normal. No respiratory distress. No wheezes. No rales. On supplemental oxygen .  Abdominal: Soft. Bowel sounds are normal. Exhibits no distension and no mass. There is no tenderness.  Musculoskeletal: Flaccid right upper extremity. RUE edema and flaccid weakness.  Lymphadenopathy:    Bilateral  cervical lymphadenopathy.  Neurological: Alert and oriented to person, place, and time. Exhibits muscle wasting.  Skin: Skin is warm and dry. Positive for some RUE bruising. No rash noted. Not diaphoretic. No erythema. No pallor.  Psychiatric: Mood, memory and judgment normal.  Vitals reviewed.  LABORATORY DATA: Lab Results  Component Value Date   WBC 12.7 (H) 05/17/2024   HGB 10.9 (L) 05/17/2024   HCT 33.0 (L) 05/17/2024   MCV 95.4 05/17/2024   PLT 287 05/17/2024      Chemistry      Component Value Date/Time   NA 136 05/17/2024 1100   K 4.1 05/17/2024 1100   CL 97 (L) 05/17/2024  1100   CO2 33 (H) 05/17/2024 1100   BUN 11 05/17/2024 1100   CREATININE 0.43 (L) 05/17/2024 1100      Component Value Date/Time   CALCIUM  10.0 05/17/2024 1100   ALKPHOS 95 05/17/2024 1100   AST 13 (L) 05/17/2024 1100   ALT 9 05/17/2024 1100   BILITOT 0.3 05/17/2024 1100       RADIOGRAPHIC STUDIES:  NM PET Image Restag (PS) Skull Base To Thigh Result Date: 05/06/2024 EXAM: PET AND CT SKULL BASE TO MID THIGH 05/04/2024 02:35:24 PM TECHNIQUE: RADIOPHARMACEUTICAL: 5.81 mCi F-18 FDG Uptake time 60 minutes. Glucose level 74 mg/dl. PET imaging was acquired from the base of the skull to the mid thighs. Non-contrast enhanced computed tomography was obtained for attenuation correction and anatomic localization. COMPARISON: Chest abdomen and pelvic CTs of 04/25/2024 and most recent PET of 10/22/2023. CLINICAL HISTORY: Non-small cell lung cancer (NSCLC), metastatic, assess treatment response; Assess the new consolidation in the left lower lung that showed on recent CT. 5.81 mCi F18 FDG IV Lt Wrist @ 1315 MM; BG - 74; PET Scan for adenocarcinoma of right lung, stage 4; non-small cell lung cancer, metastatic, assess treatment response; Not diabetic; nerve stimulator placement in back roughly 6 months ago; no biopsies; chemo on 04/26/2024; has nerve stimulator but no other OBMD; EOV. FINDINGS: HEAD AND NECK: Resolved  low cervical nodal hypermetabolism. No cervical adenopathy. Cervical spinal stimulator. CHEST: The dominant paraesophageal esophageal mass is hypermetabolic. 2.1 x 2.7 cm on recent diagnostic CT and SUV 34.1 on image 51 / 4. Compare SUV 23.8 and significantly smaller on the prior PET. Bilateral hilar hypermetabolism is new and without correlate adenopathy. Exam on the left at SUV 8.3. The left lower lobe area of presumed nodular consolidation on the prior diagnostic chest CT is decreased, with central cavitation today. Example 2.0 cm in SUV 2.8 on image 80 / 4. New subtle focus of left pleural hypermetabolism with possible pleural thickening. SUV 2.7 on image 72 / 4. Loculated small right pleural effusion is similar to prior chest CT. The left-sided effusion has resolved. Aortic and coronary artery atherosclerosis. Tiny pulmonary nodules were detailed on the prior chest CT and are below PET resolution. ABDOMEN AND PELVIS: No metabolically active intraperitoneal mass. No metabolically active lymphadenopathy. Physiologic activity within the gastrointestinal and genitourinary systems. Bilateral low density renal lesions are likely cysts and do not warrant specific imaging follow up. Normal adrenal glands. Mild Prostatomegaly. BONES AND SOFT TISSUE: Multiple new foci of lower cervical osseous hypermetabolism. Example involving right-sided lower cervical facets. An index lesion within the approximately T2 vertebral body measures SUV 17.4 , CT occult. Cervical spinal stimulator. IMPRESSION: 1. Compared to the prior pet, disease progression as evidenced by enlargement and increased hypermetabolism within a mediastinal mass, new bilateral hilar nodal metastasis. Equivocal isolated left pleural metastasis. 2.  the previously described low cervical nodal metastasis have resolved. 3.  new small volume cervical / thoracic vertebral osseous metastasis 4. No subdiaphragmatic metastasis identified. 5. the left lower lobe presumed  nodular consolidation is improved with new cavitation, likely infectious / inflammatory. 6. Resolved left pleural effusion. Persistent loculated small right pleural effusion. 7. incidental findings, including: Aortic atherosclerosis (icd10-i70.0), coronary artery atherosclerosis, and emphysema (icd10-j43.9). Electronically signed by: Rockey Kilts MD 05/06/2024 10:35 AM EDT RP Workstation: [id number]     ASSESSMENT/PLAN:  This is a very pleasant 63 year old Caucasian male with recurrent lung cancer initially diagnosed with stage IIIb non-small cell lung cancer, adenocarcinoma.  He presented with a  right upper lobe lung mass in addition to mediastinal and bilateral supraclavicular lymphadenopathy.  He was diagnosed in January 2020.  His PDL 1 expression is 10% and he has no actionable mutations.    He underwent a course of concurrent chemoradiation with weekly carboplatin  and paclitaxel . He is status post 6 cycles.  He tolerated well except for fatigue and odynophagia.    He completed his 26 cycles of consolidation immunotherapy with Imfinzi  10 mg/kg IV every 2 weeks. He tolerated it well without any adverse side effects except has a very mild skin rash over his right cervical area. He completed radiation to the enlarging right cervical lymph nodes. His last radiation treatment was on 04/13/2019.   He received 1 cycle of systemic chemotherapy with carboplatin , Alimta  and Keytruda . Repeat tissue biopsy from the left supraclavicular lymph nodes and molecular studies showed that the patient has positive ALK gene translocation. His previous molecular studies by Guardant 360 was negative. Dr. Sherrod discontinue his systemic chemotherapy for now because of the new findings on the molecular studies.    The patient has been on treatment with [name]  600 mg p.o. twice daily.  He is status post 43 months of treatment.    He unfortunately was found to have evidence of disease progression and his treatment was  switched to 100 mg of [name]  p.o. daily.  He started this on 10/20/2023.    He completed palliative radiation to the cervical lymph nodes on 11/17/23.   The patient had significant intolerance to [name]  and he was recently hospitalized with hallucinations. He was discharged on 11/22/23.   He then underwent systemic chemotherapy with carboplatin  for an AUC of 4 (dose reduced) and Alimta  400 mg/m (dose reduced), and [name]  IV every 3 weeks. He is status post 6 cycles of treatment.  Starting from cycle #5 he started single agent chemotherapy with Alimta  400 mg/m and avastin .   The patient was found to have disease progression at his October 2025 appointment.  Therefore Dr. Sherrod started him on treatment with [name] 90 mg p.o. daily. He started this on 05/22/24.   Thus far, he is tolerating this well.   The patient was seen with Dr. Sherrod. Labs were reviewed.  Recommend that he continue on the same treatment.  He continue the 180 mg p.o. daily.   Their [facility]. I have sent the next month's supply.   We will see him back in 2 weeks for evaluation and repeat blood work.   Hypertension Elevated blood pressure, potentially due to new medication. Started 180 mg today. - Check blood pressure regularly at home and keep a log. - Report blood pressure readings if consistently high. - Recheck blood pressure after being seated.  I recommend that he consult with prescribing physicians regarding Cymbalta , Effexor , and Remeron  use whether he should be on all 3 and if any should be titrated down and discontinued.   The patient was advised to call immediately if he has any concerning symptoms in the interval. The patient voices understanding of current disease status and treatment options and is in agreement with the current care plan. All questions were answered. The patient knows to call the clinic with any problems, questions or concerns. We can  certainly see the patient much sooner if necessary   No orders of the defined types were placed in this encounter.    Brynda Heick L Shadow Schedler, PA-C 05/26/24  ADDENDUM: Hematology/Oncology Attending: I had a face-to-face encounter  with the patient today.  I reviewed his records, lab recommended his care plan.  This is a very pleasant 63 years old white male with metastatic non-small cell lung cancer, adenocarcinoma with positive ALK gene translocation.  He is status post several treatment in the past including a course of concurrent chemoradiation as well as systemic chemotherapy, consolidation immunotherapy, treatment with alectinib for 44 months before it was discontinued secondary to disease progression.  He also started treatment with [name]  for few weeks but this discontinued secondary to hallucination and intolerance. He was treated with systemic chemotherapy with carboplatin , Alimta  and [name]  for 7 cycles discontinued recently secondary to disease progression.  The patient started recently treatment with [name] (Alungbrig) initially 90 mg p.o. daily for the first week and he tolerated the first week of his treatment fairly well. I recommended for the patient to increase the dose to the standard 180 mg daily. He was advised to monitor closely for any change in breathing condition or any other adverse effects. The patient and his wife are in agreement with the current plan. He will come back for follow-up visit in 2 weeks for evaluation and management of any adverse effect of his treatment. He was advised to call immediately if he has any other concerning symptoms in the interval. Disclaimer: This note was dictated with voice recognition software. Similar sounding words can inadvertently be transcribed and may be missed upon review.  Sherrod MARLA Sherrod, MD

## 2024-05-29 ENCOUNTER — Other Ambulatory Visit: Payer: Self-pay

## 2024-05-29 ENCOUNTER — Inpatient Hospital Stay: Admitting: Physician Assistant

## 2024-05-29 ENCOUNTER — Inpatient Hospital Stay

## 2024-05-29 VITALS — BP 148/88 | HR 100 | Temp 98.0°F | Resp 17 | Ht 68.0 in | Wt 119.0 lb

## 2024-05-29 DIAGNOSIS — Z9221 Personal history of antineoplastic chemotherapy: Secondary | ICD-10-CM | POA: Diagnosis not present

## 2024-05-29 DIAGNOSIS — C3491 Malignant neoplasm of unspecified part of right bronchus or lung: Secondary | ICD-10-CM

## 2024-05-29 DIAGNOSIS — Z79899 Other long term (current) drug therapy: Secondary | ICD-10-CM | POA: Diagnosis not present

## 2024-05-29 DIAGNOSIS — E039 Hypothyroidism, unspecified: Secondary | ICD-10-CM | POA: Diagnosis not present

## 2024-05-29 DIAGNOSIS — N400 Enlarged prostate without lower urinary tract symptoms: Secondary | ICD-10-CM | POA: Diagnosis not present

## 2024-05-29 DIAGNOSIS — C3411 Malignant neoplasm of upper lobe, right bronchus or lung: Secondary | ICD-10-CM | POA: Diagnosis not present

## 2024-05-29 DIAGNOSIS — C771 Secondary and unspecified malignant neoplasm of intrathoracic lymph nodes: Secondary | ICD-10-CM | POA: Diagnosis not present

## 2024-05-29 DIAGNOSIS — C7931 Secondary malignant neoplasm of brain: Secondary | ICD-10-CM | POA: Diagnosis not present

## 2024-05-29 DIAGNOSIS — I2510 Atherosclerotic heart disease of native coronary artery without angina pectoris: Secondary | ICD-10-CM | POA: Diagnosis not present

## 2024-05-29 DIAGNOSIS — C7951 Secondary malignant neoplasm of bone: Secondary | ICD-10-CM | POA: Diagnosis not present

## 2024-05-29 DIAGNOSIS — C782 Secondary malignant neoplasm of pleura: Secondary | ICD-10-CM | POA: Diagnosis not present

## 2024-05-29 DIAGNOSIS — Z923 Personal history of irradiation: Secondary | ICD-10-CM | POA: Diagnosis not present

## 2024-05-29 DIAGNOSIS — Z86718 Personal history of other venous thrombosis and embolism: Secondary | ICD-10-CM | POA: Diagnosis not present

## 2024-05-29 DIAGNOSIS — I10 Essential (primary) hypertension: Secondary | ICD-10-CM | POA: Diagnosis not present

## 2024-05-29 LAB — CMP (CANCER CENTER ONLY)
ALT: 14 U/L (ref 0–44)
AST: 21 U/L (ref 15–41)
Albumin: 3.6 g/dL (ref 3.5–5.0)
Alkaline Phosphatase: 124 U/L (ref 38–126)
Anion gap: 8 (ref 5–15)
BUN: 8 mg/dL (ref 6–20)
CO2: 34 mmol/L — ABNORMAL HIGH (ref 22–32)
Calcium: 9.6 mg/dL (ref 8.9–10.3)
Chloride: 97 mmol/L — ABNORMAL LOW (ref 98–111)
Creatinine: 0.49 mg/dL — ABNORMAL LOW (ref 0.61–1.24)
GFR, Estimated: >60 mL/min (ref >60)
Glucose, Bld: 118 mg/dL — ABNORMAL HIGH (ref 70–99)
Potassium: 3.9 mmol/L (ref 3.5–5.1)
Sodium: 139 mmol/L (ref 135–145)
Total Bilirubin: 0.3 mg/dL (ref 0.0–1.2)
Total Protein: 7.2 g/dL (ref 6.5–8.1)

## 2024-05-29 LAB — CBC WITH DIFFERENTIAL (CANCER CENTER ONLY)
Abs Immature Granulocytes: 0.05 K/uL (ref 0.00–0.07)
Basophils Absolute: 0.1 K/uL (ref 0.0–0.1)
Basophils Relative: 1 %
Eosinophils Absolute: 0.2 K/uL (ref 0.0–0.5)
Eosinophils Relative: 1 %
HCT: 36.5 % — ABNORMAL LOW (ref 39.0–52.0)
Hemoglobin: 11.8 g/dL — ABNORMAL LOW (ref 13.0–17.0)
Immature Granulocytes: 0 %
Lymphocytes Relative: 7 %
Lymphs Abs: 1 K/uL (ref 0.7–4.0)
MCH: 31.1 pg (ref 26.0–34.0)
MCHC: 32.3 g/dL (ref 30.0–36.0)
MCV: 96.3 fL (ref 80.0–100.0)
Monocytes Absolute: 1.3 K/uL — ABNORMAL HIGH (ref 0.1–1.0)
Monocytes Relative: 9 %
Neutro Abs: 12 K/uL — ABNORMAL HIGH (ref 1.7–7.7)
Neutrophils Relative %: 82 %
Platelet Count: 322 K/uL (ref 150–400)
RBC: 3.79 MIL/uL — ABNORMAL LOW (ref 4.22–5.81)
RDW: 13.9 % (ref 11.5–15.5)
WBC Count: 14.7 K/uL — ABNORMAL HIGH (ref 4.0–10.5)
nRBC: 0 % (ref 0.0–0.2)

## 2024-05-29 LAB — CK: Total CK: 96 U/L (ref 49–397)

## 2024-05-29 MED ORDER — BRIGATINIB 180 MG PO TABS: 180.0000 mg | ORAL_TABLET | Freq: Every day | ORAL | 2 refills | Status: DC

## 2024-05-29 NOTE — Progress Notes (Signed)
 Patient has to get at [facility]  Lucie Lamer, CPhT [place]  [facility] Patient Advocate Specialist II [place] Phone: [phone]  Fax: [phone] [email or web address]

## 2024-05-30 ENCOUNTER — Other Ambulatory Visit: Payer: Self-pay | Admitting: *Deleted

## 2024-05-30 DIAGNOSIS — C3491 Malignant neoplasm of unspecified part of right bronchus or lung: Secondary | ICD-10-CM

## 2024-05-30 MED ORDER — BRIGATINIB 180 MG PO TABS: 180.0000 mg | ORAL_TABLET | Freq: Every day | ORAL | 2 refills | Status: DC

## 2024-06-01 ENCOUNTER — Encounter: Payer: Self-pay | Admitting: Internal Medicine

## 2024-06-02 ENCOUNTER — Other Ambulatory Visit: Payer: Self-pay | Admitting: Physician Assistant

## 2024-06-02 DIAGNOSIS — C3491 Malignant neoplasm of unspecified part of right bronchus or lung: Secondary | ICD-10-CM

## 2024-06-07 ENCOUNTER — Inpatient Hospital Stay

## 2024-06-07 ENCOUNTER — Inpatient Hospital Stay: Admitting: Internal Medicine

## 2024-06-12 NOTE — Progress Notes (Unsigned)
 [facility] OFFICE PROGRESS NOTE  [name], MD [address] [place] [address]  DIAGNOSIS:  Metastatic non-small cell lung cancer initially diagnosed as stage IIIB (T1c, N3, M0) non-small cell lung cancer, adenocarcinoma presented with right upper lobe lung mass in addition to mediastinal and bilateral supraclavicular lymphadenopathy diagnosed in January 2020.  He had evidence of disease recurrence in June 2021 with adenopathy in the subcarinal, level 3 left neck lymph node, and lymph node at the curious of the diaphragm and in the upper abdomen.   PD-L1: 10%   Guardant 360 molecular studies showed no actionable mutation   Foundation One Testing:   Biomarker Findings Microsatellite status - MS-Stable Tumor Mutational Burden - 4 Muts/Mb Genomic Findings For a complete list of the genes assayed, please refer to the Appendix. ALK EML4-ALK fusion (Variant 2) SMARCB1 R377C CTNNB1 S45P CDKN2A/B CDKN2B loss, CDKN2A loss CXCR4 W106* TP53 splice site 672G>A 7 Disease relevant genes with no reportable alterations: BRAF, EGFR, ERBB2, KRAS, MET, RET, ROS1  PRIOR THERAPY:  1) Concurrent chemoradiation with weekly carboplatin  for AUC of 2 and paclitaxel  45 mg/M2.  Status post 6 cycles.  Last dose was given on October 10, 2018 with stable disease. 2) Radiation treatment to the enlarging right cervical lymph nodes under the care of Dr. Shannon. First treatment 03/06/2019. Last treatment scheduled on 04/13/2019 3) Consolidation treatment with immunotherapy with Imfinzi  10 mg/KG every 2 weeks.  First dose November 17, 2018.  Status post 26 cycles. 4) Systemic chemotherapy with carboplatin  for an AUC of 5, Alimta  500 mg/m2, and Keytruda  200 mg IV every 3 weeks. First dose expected on 02/14/20. Status post 1 cycle.  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 5) SBRT to the left lower lobe lung  nodule. 6) [name] 600 mg p.o. twice daily.  He started the first dose on March 08, 2020.   Status post 44 months of treatment.  Discontinued in March 2025 due to disease progression 7) [name] 100 mg daily.  Discontinued in April 2025 due to severe intolerance due to mental status changes/confusion  8) Radiation to the bilateral cervical lymph nodes under the care of Dr. Dewey last day expected on 11/17/2023   9) Palliative systemic chemotherapy with carboplatin  for an AUC of 4 (dose reduced), Alimta  500 mg/m (dose reduced), and [name]  IV every 3 weeks.  First dose expected on 12/23/2023.  Status post 7 cycles.  CURRENT THERAPY: [name] (brigatinib) 90 mg by mouth p.o. daily. First dose on 05/22/24. If he tolerates this, then will increase to 180 mg tablets once daily with or without food. His dose will be decreased to 120 mg due to intolerance.   INTERVAL HISTORY: TAMAS SUEN 63 y.o. male returns clinic today for a follow-up visit accompanied by his wife.  He was last seen in the clinic by myself on 05/29/2024.  Unfortunately, the patient was found to have disease progression in early October 2025.   Therefore his treatment was changed to [name].  He started this around 05/22/2024.  Thus far he had been tolerating it well without any appreciable side effects.   His dose was increased to 180 mg. He took this for 4 days then developed He experienced decreased appetite, weakness, diarrhea, constipation, and severe nausea after starting a new medication regimen. The nausea was not alleviated by Compazine , and he did not have Zofran  available to  try as an alternative. His appetite returned after stopping the medication, and he experienced diarrhea for a day and a half without taking Imodium.  He has not been able to take his previous 90 mg medication as he ran out and did not have any leftover. He has not been on any medication since the issues began. Therefore, he  has been off this for about 1.5 weeks.   He has been using his supplemental oxygen  more. He has history of pleural effusion requiring thoracentesis. No chest pain is present, but there are occasional muscle cramps in the left chest area.  He only coughs when he drinks water . No fevers, chills, night sweats, or rashes. He reports coughing but does not specify the frequency or severity.  He is here for evaluation and repeat blood work.   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  MEDICATIONS:  Current Outpatient Medications  Medication Sig Dispense Refill   ondansetron  (ZOFRAN ) 8 MG tablet Take 1 tablet (8 mg total) by mouth every 8 (eight) hours as needed for nausea or vomiting. 30 tablet 2   acetaminophen  (TYLENOL ) 500 MG tablet Take 1,000 mg by mouth every 6 (six) hours as needed (for pain).     albuterol  (VENTOLIN  HFA) 108 (90 Base) MCG/ACT inhaler INHALE 2 PUFFS INTO THE LUNGS EVERY 4 (FOUR) HOURS AS NEEDED FOR WHEEZING OR SHORTNESS OF BREATH. 6.7 each 4   amLODipine  (NORVASC ) 5 MG tablet Take 0.5 tablets (2.5 mg total) by mouth in the morning. 30 tablet 2   atorvastatin  (LIPITOR) 20 MG tablet Take 20 mg by mouth in the morning.     brigatinib ([name]) 30 MG tablet Take 4 tablets (120 mg total) by mouth daily. Take  with or without food. Swallow tablets whole. Do not crush or chew. 120 tablet 1   cyclobenzaprine  (FLEXERIL ) 5 MG tablet Take 1 tablet (5 mg total) by mouth 3 (three) times daily as needed for muscle spasms. 30 tablet 0   DULoxetine  (CYMBALTA ) 60 MG capsule Take 1 capsule (60 mg total) by mouth daily. 90 capsule 3   fluticasone  (FLONASE ) 50 MCG/ACT nasal spray Place 1 spray into both nostrils daily. 1 mL 2   folic acid  (FOLVITE ) 1 MG tablet TAKE 1 TABLET BY MOUTH EVERY DAY 30 tablet 2   gabapentin  (NEURONTIN ) 600 MG tablet Take 600 mg by mouth.     levothyroxine  (SYNTHROID ) 100 MCG tablet Take 100 mcg by mouth daily before breakfast.     LORazepam  (ATIVAN ) 0.5 MG tablet 1 tablet p.o. 30 minutes before PET scan repeat x 1 if needed 2 tablet 0   MIRALAX  17 GM/SCOOP powder Take 17 g by mouth daily as needed for mild constipation.     mirtazapine  (REMERON )  15 MG tablet TAKE 1 TABLET BY MOUTH EVERYDAY AT BEDTIME 90 tablet 1   prochlorperazine  (COMPAZINE ) 10 MG tablet TAKE 1 TABLET BY MOUTH EVERY 6 HOURS AS NEEDED 30 tablet 2   TRELEGY ELLIPTA  100-62.5-25 MCG/ACT AEPB INHALE 1 PUFF BY MOUTH ONCE DAILY 180 each 3   venlafaxine  (EFFEXOR ) 37.5 MG tablet Take 37.5 mg by mouth in the morning.     No current facility-administered medications for this visit.    SURGICAL HISTORY:  Past Surgical History:  Procedure Laterality Date   COLONOSCOPY     FEMUR FRACTURE SURGERY Left 1969   INGUINAL HERNIA REPAIR Right 06/24/2020   Procedure: RIGHT OPEN INGUINAL HERNIA REPAIR WITH MESH;  Surgeon: Kinsinger, Herlene Righter, MD;  Location: [facility];  Service: General;  Laterality: Right;   IR IVC FILTER PLMT / S&I PORTER GUID/MOD SED  03/04/2020   IR IVC FILTER RETRIEVAL / S&I PORTER GUID/MOD SED  08/15/2020   IR RADIOLOGIST EVAL & MGMT  08/06/2020   SPINAL CORD STIMULATOR INSERTION N/A 10/05/2023   Procedure: CERVICAL SPINAL CORD STIMULATOR PLACEMENT;  Surgeon: Claudene Penne ORN, MD;  Location: [facility] ORS;  Service:  Neurosurgery;  Laterality: N/A;   THORACENTESIS Right 12/10/2023   Procedure: THORACENTESIS;  Surgeon: Kara Dorn NOVAK, MD;  Location: [facility] ENDOSCOPY;  Service: Pulmonary;  Laterality: Right;    REVIEW OF SYSTEMS:   Review of Systems  Constitutional: Positive for fatigue. Negative for appetite change, chills, fever.  HENT: Negative for mouth sores, nosebleeds, sore throat and trouble swallowing.   Eyes: Negative for eye problems and icterus.  Respiratory: Positive for occasional shortness of breath. Negative for cough, hemoptysis and wheezing.   Cardiovascular: Negative for chest pain and leg swelling.  Gastrointestinal: Positive for occasional constipation. Negative for abdominal pain, diarrhea, nausea and vomiting.  Genitourinary: Negative for bladder incontinence, difficulty urinating, dysuria, frequency and hematuria.   Musculoskeletal: flaccid right arm. Negative for back pain, gait problem, neck pain and neck stiffness.  Skin: Negative for itching and rash.  Neurological: Negative for dizziness, extremity weakness, gait problem, headaches, light-headedness and seizures.  Hematological: Negative for adenopathy. Does not bruise/bleed easily.  Psychiatric/Behavioral: Negative for confusion, depression and sleep disturbance. The patient is not nervous/anxious.         PHYSICAL EXAMINATION:  Blood pressure (!) 135/90, pulse (!) 117, temperature 98.3 F (36.8 C), temperature source Temporal, resp. rate 17, height 5' 8 (1.727 m), weight 118 lb (53.5 kg), SpO2 100%.  ECOG PERFORMANCE STATUS: 2-3  Physical Exam  Constitutional: Oriented to person, place, and time and chronically ill appearing male and in no distress.  HENT:  Head: Normocephalic and atraumatic.  Mouth/Throat: Oropharynx is clear and moist. No oropharyngeal exudate.  Eyes: Conjunctivae are normal. Right eye exhibits no discharge. Left eye exhibits no discharge. No scleral icterus.  Neck: Normal range of motion. Neck  supple.  Cardiovascular: Tachycardic, regular rhythm, normal heart sounds and intact distal pulses.   Pulmonary/Chest: Effort normal. No respiratory distress. No wheezes. No rales. On supplemental oxygen . Decreased breath sounds in right lung. Abdominal: Soft. Bowel sounds are normal. Exhibits no distension and no mass. There is no tenderness.  Musculoskeletal: Flaccid right upper extremity. RUE edema and flaccid weakness.  Lymphadenopathy:    Bilateral cervical lymphadenopathy.  Neurological: Alert and oriented to person, place, and time. Exhibits muscle wasting.  Skin: Skin is warm and dry. Positive for some RUE bruising. No rash noted. Not diaphoretic. No erythema. No pallor.  Psychiatric: Mood, memory and judgment normal.  Vitals reviewed.  LABORATORY DATA: Lab Results  Component Value Date   WBC 8.3 06/15/2024   HGB 11.8 (L) 06/15/2024   HCT 36.5 (L) 06/15/2024   MCV 95.3 06/15/2024   PLT 269 06/15/2024      Chemistry      Component Value Date/Time   NA 137 06/15/2024 1105   K 3.5 06/15/2024 1105   CL 95 (L) 06/15/2024 1105   CO2 35 (H) 06/15/2024 1105   BUN 9 06/15/2024 1105   CREATININE 0.37 (L) 06/15/2024 1105      Component Value Date/Time   CALCIUM  9.4 06/15/2024 1105   ALKPHOS 117 06/15/2024 1105   AST 18 06/15/2024 1105   ALT 10 06/15/2024 1105   BILITOT 0.2 06/15/2024 1105       RADIOGRAPHIC STUDIES:  No results found.   ASSESSMENT/PLAN:  This is a very pleasant 63 year old Caucasian male with recurrent lung cancer initially diagnosed with stage IIIb non-small cell lung cancer, adenocarcinoma.  He presented with a right upper lobe lung mass in addition to mediastinal and bilateral supraclavicular lymphadenopathy.  He was diagnosed in January 2020.  His PDL 1 expression is 10% and he has no actionable mutations.    He underwent a course of concurrent chemoradiation with weekly carboplatin  and paclitaxel . He is status post 6 cycles.  He tolerated well  except for fatigue and odynophagia.    He completed his 26 cycles of consolidation immunotherapy with Imfinzi  10 mg/kg IV every 2 weeks. He tolerated it well without any adverse side effects except has a very mild skin rash over his right cervical area. He completed radiation to the enlarging right cervical lymph nodes. His last radiation treatment was on 04/13/2019.   He received 1 cycle of systemic chemotherapy with carboplatin , Alimta  and Keytruda . Repeat tissue biopsy from the left supraclavicular lymph nodes and molecular studies showed that the patient has positive ALK gene translocation. His previous molecular studies by Guardant 360 was negative. Dr. Sherrod discontinue his systemic chemotherapy for now because of the new findings on the molecular studies.    The patient has been on treatment with [name]  600 mg p.o. twice daily.  He is status post 43 months of treatment.    He unfortunately was found to have evidence of disease progression and his treatment was switched to 100 mg of [name]  p.o. daily.  He started this on 10/20/2023.    He completed palliative radiation to the cervical lymph nodes on 11/17/23.   The patient had significant intolerance to [name]  and he was recently hospitalized with hallucinations. He was discharged on 11/22/23.    He then underwent systemic chemotherapy with carboplatin  for an AUC of 4 (dose reduced) and Alimta  400 mg/m (dose reduced), and [name]  IV every 3 weeks. He is status post 6 cycles of treatment.  Starting from cycle #5 he started single agent chemotherapy with Alimta  400 mg/m and avastin .   The patient was found to have disease progression at his October 2025 appointment.  Therefore Dr. Sherrod started him on treatment with [name] 90 mg p.o. daily. He started this on 05/22/24. He started the 180 mg p.o. daily on 05/29/24   He had intolerance to this after 4 days. Therefore, he discontinued this.   Discussed this with Dr. Sherrod  today.  I also talked to the oral chemotherapy pharmacist.  We will restart his treatment with 120 mg which is 4 milligrams tablets.  I have sent this to his specialty pharmacy.  I discussed recommendations  regarding symptom management should he have any nausea to take Zofran  in the morning prior to taking his [name].   Discussed management should he experience any diarrhea.  We discussed the dosing for Imodium.  I asked that he contact us  should he have any intolerance to the medication so we can provide further recommendations.  The patient has a serious medical condition and it could have poor outcomes if he discontinues his oral chemotherapy without notifying our office.  Will arrange for chest x-ray today to ensure he does not have a recurrent effusion that requires thoracentesis.  Will see him back for close interval follow-up in 2 weeks with repeat blood work.  Per discussion with Dr. Sherrod, we will arrange for a repeat CT scan after being on treatment for 2 months.  The patient was advised to call immediately if he has any concerning symptoms in the interval. The patient voices understanding of current disease status and treatment options and is in agreement with the current care plan. All questions were answered. The patient knows to call the clinic with any problems, questions or concerns. We can certainly see the patient much sooner if necessary     Orders Placed This Encounter  Procedures   DG Chest 2 View    Standing Status:   Future    Number of Occurrences:   1    Expected Date:   06/15/2024    Expiration Date:   06/15/2025    Reason for Exam (SYMPTOM  OR DIAGNOSIS REQUIRED):   Reassess his pleural effusion to see if he needs thora. Lung cancer    Preferred imaging location?:   [facility]   CBC with Differential (Cancer Center Only)    Standing Status:   Future    Expected Date:   06/29/2024    Expiration Date:   06/15/2025   CMP (Cancer Center only)     Standing Status:   Future    Expected Date:   06/29/2024    Expiration Date:   06/15/2025     The total time spent in the appointment was 30-39 minutes  Anaysia Germer L Elion Hocker, PA-C 06/15/24

## 2024-06-15 ENCOUNTER — Other Ambulatory Visit: Payer: Self-pay | Admitting: Internal Medicine

## 2024-06-15 ENCOUNTER — Inpatient Hospital Stay: Attending: Internal Medicine

## 2024-06-15 ENCOUNTER — Ambulatory Visit (HOSPITAL_COMMUNITY)
Admission: RE | Admit: 2024-06-15 | Discharge: 2024-06-15 | Disposition: A | Discharge status: Home / Self Care | Source: Ambulatory Visit | Attending: Physician Assistant | Admitting: Physician Assistant

## 2024-06-15 ENCOUNTER — Other Ambulatory Visit (HOSPITAL_COMMUNITY): Payer: Self-pay

## 2024-06-15 ENCOUNTER — Inpatient Hospital Stay (HOSPITAL_BASED_OUTPATIENT_CLINIC_OR_DEPARTMENT_OTHER): Admitting: Physician Assistant

## 2024-06-15 ENCOUNTER — Other Ambulatory Visit: Payer: Self-pay | Admitting: Physician Assistant

## 2024-06-15 ENCOUNTER — Telehealth: Payer: Self-pay | Admitting: Pharmacist

## 2024-06-15 ENCOUNTER — Other Ambulatory Visit: Payer: Self-pay | Admitting: Student in an Organized Health Care Education/Training Program

## 2024-06-15 VITALS — BP 135/90 | HR 117 | Temp 98.3°F | Resp 17 | Ht 68.0 in | Wt 118.0 lb

## 2024-06-15 DIAGNOSIS — Z85118 Personal history of other malignant neoplasm of bronchus and lung: Secondary | ICD-10-CM | POA: Diagnosis not present

## 2024-06-15 DIAGNOSIS — C782 Secondary malignant neoplasm of pleura: Secondary | ICD-10-CM | POA: Diagnosis not present

## 2024-06-15 DIAGNOSIS — C3491 Malignant neoplasm of unspecified part of right bronchus or lung: Secondary | ICD-10-CM

## 2024-06-15 DIAGNOSIS — C7951 Secondary malignant neoplasm of bone: Secondary | ICD-10-CM | POA: Diagnosis not present

## 2024-06-15 DIAGNOSIS — C3430 Malignant neoplasm of lower lobe, unspecified bronchus or lung: Secondary | ICD-10-CM | POA: Diagnosis not present

## 2024-06-15 DIAGNOSIS — Z86718 Personal history of other venous thrombosis and embolism: Secondary | ICD-10-CM | POA: Insufficient documentation

## 2024-06-15 DIAGNOSIS — Z9221 Personal history of antineoplastic chemotherapy: Secondary | ICD-10-CM | POA: Diagnosis not present

## 2024-06-15 DIAGNOSIS — R0602 Shortness of breath: Secondary | ICD-10-CM | POA: Diagnosis not present

## 2024-06-15 DIAGNOSIS — C7931 Secondary malignant neoplasm of brain: Secondary | ICD-10-CM | POA: Insufficient documentation

## 2024-06-15 DIAGNOSIS — C3411 Malignant neoplasm of upper lobe, right bronchus or lung: Secondary | ICD-10-CM | POA: Insufficient documentation

## 2024-06-15 DIAGNOSIS — Z79899 Other long term (current) drug therapy: Secondary | ICD-10-CM | POA: Diagnosis not present

## 2024-06-15 DIAGNOSIS — R918 Other nonspecific abnormal finding of lung field: Secondary | ICD-10-CM | POA: Diagnosis not present

## 2024-06-15 DIAGNOSIS — R112 Nausea with vomiting, unspecified: Secondary | ICD-10-CM

## 2024-06-15 DIAGNOSIS — Z923 Personal history of irradiation: Secondary | ICD-10-CM | POA: Insufficient documentation

## 2024-06-15 DIAGNOSIS — C771 Secondary and unspecified malignant neoplasm of intrathoracic lymph nodes: Secondary | ICD-10-CM | POA: Insufficient documentation

## 2024-06-15 DIAGNOSIS — J90 Pleural effusion, not elsewhere classified: Secondary | ICD-10-CM | POA: Diagnosis not present

## 2024-06-15 DIAGNOSIS — Z982 Presence of cerebrospinal fluid drainage device: Secondary | ICD-10-CM | POA: Diagnosis not present

## 2024-06-15 LAB — CMP (CANCER CENTER ONLY)
ALT: 10 U/L (ref 0–44)
AST: 18 U/L (ref 15–41)
Albumin: 3.5 g/dL (ref 3.5–5.0)
Alkaline Phosphatase: 117 U/L (ref 38–126)
Anion gap: 7 (ref 5–15)
BUN: 9 mg/dL (ref 6–20)
CO2: 35 mmol/L — ABNORMAL HIGH (ref 22–32)
Calcium: 9.4 mg/dL (ref 8.9–10.3)
Chloride: 95 mmol/L — ABNORMAL LOW (ref 98–111)
Creatinine: 0.37 mg/dL — ABNORMAL LOW (ref 0.61–1.24)
GFR, Estimated: >60 mL/min (ref >60)
Glucose, Bld: 130 mg/dL — ABNORMAL HIGH (ref 70–99)
Potassium: 3.5 mmol/L (ref 3.5–5.1)
Sodium: 137 mmol/L (ref 135–145)
Total Bilirubin: 0.2 mg/dL (ref 0.0–1.2)
Total Protein: 6.8 g/dL (ref 6.5–8.1)

## 2024-06-15 LAB — CBC WITH DIFFERENTIAL (CANCER CENTER ONLY)
Abs Immature Granulocytes: 0.03 K/uL (ref 0.00–0.07)
Basophils Absolute: 0.1 K/uL (ref 0.0–0.1)
Basophils Relative: 1 %
Eosinophils Absolute: 0.1 K/uL (ref 0.0–0.5)
Eosinophils Relative: 2 %
HCT: 36.5 % — ABNORMAL LOW (ref 39.0–52.0)
Hemoglobin: 11.8 g/dL — ABNORMAL LOW (ref 13.0–17.0)
Immature Granulocytes: 0 %
Lymphocytes Relative: 5 %
Lymphs Abs: 0.4 K/uL — ABNORMAL LOW (ref 0.7–4.0)
MCH: 30.8 pg (ref 26.0–34.0)
MCHC: 32.3 g/dL (ref 30.0–36.0)
MCV: 95.3 fL (ref 80.0–100.0)
Monocytes Absolute: 1.5 K/uL — ABNORMAL HIGH (ref 0.1–1.0)
Monocytes Relative: 18 %
Neutro Abs: 6.1 K/uL (ref 1.7–7.7)
Neutrophils Relative %: 74 %
Platelet Count: 269 K/uL (ref 150–400)
RBC: 3.83 MIL/uL — ABNORMAL LOW (ref 4.22–5.81)
RDW: 13.3 % (ref 11.5–15.5)
WBC Count: 8.3 K/uL (ref 4.0–10.5)
nRBC: 0 % (ref 0.0–0.2)

## 2024-06-15 LAB — CK: Total CK: 69 U/L (ref 49–397)

## 2024-06-15 MED ORDER — ONDANSETRON HCL 8 MG PO TABS: 8.0000 mg | ORAL_TABLET | Freq: Three times a day (TID) | ORAL | 2 refills | Status: DC | PRN

## 2024-06-15 MED ORDER — BRIGATINIB 30 MG PO TABS: 120.0000 mg | ORAL_TABLET | Freq: Every day | ORAL | 1 refills | Status: DC

## 2024-06-15 NOTE — Telephone Encounter (Signed)
 Oral Oncology Pharmacist Encounter  Called [facility] to confirm they have updated [name] Rx on file. Patient is being dose reduced from 180 mg daily to 120 mg daily. Confirmed with [facility] that they have the most updated prescription on file and have discontinued all previous prescriptions.  Since patient has been off of therapy at least ~14 days by the time he receives medication, it's recommended for patient to take [name] 90 mg daily x 7 days prior to dose escalating up to 120 mg daily to decrease risk of pulmonary toxicity.   Called patient and LVM for call back to discuss the above recommendation.   Asberry Macintosh, PharmD, BCPS, BCOP Hematology/Oncology Clinical Pharmacist [phone] 06/15/2024 2:39 PM

## 2024-06-16 ENCOUNTER — Telehealth: Payer: Self-pay | Admitting: Physician Assistant

## 2024-06-16 NOTE — Telephone Encounter (Signed)
 Attempted to call the patient to review the chest x-ray that was ordered yesterday.  Unable to reach him.  I left our callback number if he would like to discuss further.

## 2024-06-16 NOTE — Telephone Encounter (Signed)
 Oral Oncology Pharmacist Encounter  Spoke with patient and patient's wife regarding [name] prescription. Patient knows to take 3 tablets (90 mg) daily for the first 7 days when restarting [name], and then to increase to 4 tablets (120 mg) daily thereafter.   I have provided the patient and his wife with the phone number to [facility] (tele: [phone]). Patient and wife will call and set up shipment of medication to their home.   Asberry Macintosh, PharmD, BCPS, BCOP Hematology/Oncology Clinical Pharmacist [phone] 06/16/2024 1:45 PM

## 2024-06-20 ENCOUNTER — Telehealth: Payer: Self-pay | Admitting: Internal Medicine

## 2024-06-20 ENCOUNTER — Encounter: Payer: Self-pay | Admitting: Internal Medicine

## 2024-06-20 NOTE — Telephone Encounter (Signed)
 Left the patient a voicemail informing him that I moved his appointments on 11/26 back by 15 minutes.

## 2024-06-22 ENCOUNTER — Other Ambulatory Visit: Payer: Self-pay | Admitting: Internal Medicine

## 2024-06-26 ENCOUNTER — Telehealth: Payer: Self-pay | Admitting: *Deleted

## 2024-06-26 NOTE — Telephone Encounter (Signed)
 Pt wife arrived to [facility] to speak with Dr. Jeannett nurse. Pt wife had concerns of brain METS. Pt currently is having some delirium and becoming resistant. Wife stated that husband is drinking fluids but not eating much and isn't consistent with oral chemo medication. Advised to take pt to ER if issues progress and Dr. Sherrod was made aware, pt appt is set for Wednesday.

## 2024-06-28 ENCOUNTER — Inpatient Hospital Stay

## 2024-06-28 ENCOUNTER — Inpatient Hospital Stay: Admitting: Internal Medicine

## 2024-06-28 VITALS — BP 156/99 | HR 122 | Temp 97.8°F | Resp 17 | Ht 68.0 in | Wt 110.0 lb

## 2024-06-28 DIAGNOSIS — C782 Secondary malignant neoplasm of pleura: Secondary | ICD-10-CM | POA: Diagnosis not present

## 2024-06-28 DIAGNOSIS — Z79899 Other long term (current) drug therapy: Secondary | ICD-10-CM | POA: Diagnosis not present

## 2024-06-28 DIAGNOSIS — C3491 Malignant neoplasm of unspecified part of right bronchus or lung: Secondary | ICD-10-CM

## 2024-06-28 DIAGNOSIS — Z9221 Personal history of antineoplastic chemotherapy: Secondary | ICD-10-CM | POA: Diagnosis not present

## 2024-06-28 DIAGNOSIS — C3430 Malignant neoplasm of lower lobe, unspecified bronchus or lung: Secondary | ICD-10-CM | POA: Diagnosis not present

## 2024-06-28 DIAGNOSIS — C771 Secondary and unspecified malignant neoplasm of intrathoracic lymph nodes: Secondary | ICD-10-CM | POA: Diagnosis not present

## 2024-06-28 DIAGNOSIS — C3411 Malignant neoplasm of upper lobe, right bronchus or lung: Secondary | ICD-10-CM | POA: Diagnosis not present

## 2024-06-28 DIAGNOSIS — Z86718 Personal history of other venous thrombosis and embolism: Secondary | ICD-10-CM | POA: Diagnosis not present

## 2024-06-28 DIAGNOSIS — Z923 Personal history of irradiation: Secondary | ICD-10-CM | POA: Diagnosis not present

## 2024-06-28 DIAGNOSIS — R0602 Shortness of breath: Secondary | ICD-10-CM | POA: Diagnosis not present

## 2024-06-28 DIAGNOSIS — C7951 Secondary malignant neoplasm of bone: Secondary | ICD-10-CM | POA: Diagnosis not present

## 2024-06-28 DIAGNOSIS — C7931 Secondary malignant neoplasm of brain: Secondary | ICD-10-CM | POA: Diagnosis not present

## 2024-06-28 LAB — CBC WITH DIFFERENTIAL (CANCER CENTER ONLY)
Abs Immature Granulocytes: 0.05 K/uL (ref 0.00–0.07)
Basophils Absolute: 0.1 K/uL (ref 0.0–0.1)
Basophils Relative: 1 %
Eosinophils Absolute: 0.1 K/uL (ref 0.0–0.5)
Eosinophils Relative: 1 %
HCT: 36.8 % — ABNORMAL LOW (ref 39.0–52.0)
Hemoglobin: 11.8 g/dL — ABNORMAL LOW (ref 13.0–17.0)
Immature Granulocytes: 0 %
Lymphocytes Relative: 3 %
Lymphs Abs: 0.4 K/uL — ABNORMAL LOW (ref 0.7–4.0)
MCH: 30.5 pg (ref 26.0–34.0)
MCHC: 32.1 g/dL (ref 30.0–36.0)
MCV: 95.1 fL (ref 80.0–100.0)
Monocytes Absolute: 1.3 K/uL — ABNORMAL HIGH (ref 0.1–1.0)
Monocytes Relative: 9 %
Neutro Abs: 13.3 K/uL — ABNORMAL HIGH (ref 1.7–7.7)
Neutrophils Relative %: 86 %
Platelet Count: 391 K/uL (ref 150–400)
RBC: 3.87 MIL/uL — ABNORMAL LOW (ref 4.22–5.81)
RDW: 13.9 % (ref 11.5–15.5)
WBC Count: 15.3 K/uL — ABNORMAL HIGH (ref 4.0–10.5)
nRBC: 0 % (ref 0.0–0.2)

## 2024-06-28 LAB — CMP (CANCER CENTER ONLY)
ALT: 17 U/L (ref 0–44)
AST: 47 U/L — ABNORMAL HIGH (ref 15–41)
Albumin: 4 g/dL (ref 3.5–5.0)
Alkaline Phosphatase: 137 U/L — ABNORMAL HIGH (ref 38–126)
Anion gap: 14 (ref 5–15)
BUN: 14 mg/dL (ref 6–20)
CO2: 34 mmol/L — ABNORMAL HIGH (ref 22–32)
Calcium: 10.2 mg/dL (ref 8.9–10.3)
Chloride: 92 mmol/L — ABNORMAL LOW (ref 98–111)
Creatinine: 0.43 mg/dL — ABNORMAL LOW (ref 0.61–1.24)
GFR, Estimated: >60 mL/min (ref >60)
Glucose, Bld: 115 mg/dL — ABNORMAL HIGH (ref 70–99)
Potassium: 4 mmol/L (ref 3.5–5.1)
Sodium: 139 mmol/L (ref 135–145)
Total Bilirubin: 0.3 mg/dL (ref 0.0–1.2)
Total Protein: 7.7 g/dL (ref 6.5–8.1)

## 2024-06-28 LAB — CK: Total CK: 288 U/L (ref 49–397)

## 2024-06-28 MED ORDER — METHYLPREDNISOLONE 4 MG PO TBPK: ORAL_TABLET | ORAL | 0 refills | Status: DC

## 2024-06-28 NOTE — Progress Notes (Signed)
 [name]   [phone]  OFFICE PROGRESS NOTE  [name], MD [address] [place] [address]  DIAGNOSIS:  Metastatic non-small cell lung cancer initially diagnosed as stage IIIB (T1c, N3, M0) non-small cell lung cancer, adenocarcinoma presented with right upper lobe lung mass in addition to mediastinal and bilateral supraclavicular lymphadenopathy diagnosed in January 2020.  He had evidence of disease recurrence in June 2021 with adenopathy in the subcarinal, level 3 left neck lymph node, and lymph node at the curious of the diaphragm and in the upper abdomen.   PD-L1: 10%   Guardant 360 molecular studies showed no actionable mutation   Foundation One Testing:   Biomarker Findings Microsatellite status - MS-Stable Tumor Mutational Burden - 4 Muts/Mb Genomic Findings For a complete list of the genes assayed, please refer to the Appendix. ALK EML4-ALK fusion (Variant 2) SMARCB1 R377C CTNNB1 S45P CDKN2A/B CDKN2B loss, CDKN2A loss CXCR4 W106* TP53 splice site 672G>A 7 Disease relevant genes with no reportable alterations: BRAF, EGFR, ERBB2, KRAS, MET, RET, ROS1   PRIOR THERAPY: 1) Concurrent chemoradiation with weekly carboplatin  for AUC of 2 and paclitaxel  45 mg/M2.  Status post 6 cycles.  Last dose was given on October 10, 2018 with stable disease. 2) Radiation treatment to the enlarging right cervical lymph nodes under the care of Dr. Shannon. First treatment 03/06/2019. Last treatment scheduled on 04/13/2019 3) Consolidation treatment with immunotherapy with Imfinzi  10 mg/KG every 2 weeks.  First dose November 17, 2018.  Status post 26 cycles. 4) Systemic chemotherapy with carboplatin  for an AUC of 5, Alimta  500 mg/m2, and Keytruda  200 mg IV every 3 weeks. First dose expected on 02/14/20. Status post 1 cycle.  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 5) SBRT to the left lower lobe lung nodule. 6) [name] 600 mg p.o. twice daily.  He started the first dose on March 08, 2020.   Status post 44 months of treatment.  Discontinued in March 2025 due to disease progression 7) [name] 100 mg daily.  Discontinued in April 2025 due to severe intolerance due to mental status changes/confusion  8) Radiation to the bilateral cervical lymph nodes under the care of Dr. Dewey last day expected on 11/17/2023   9) Palliative systemic chemotherapy with carboplatin  for an AUC of 4 (dose reduced), Alimta  500 mg/m (dose reduced), and [name]  IV every 3 weeks.  First dose expected on 12/23/2023.  Status post 7 cycles.  CURRENT THERAPY: [name] 90 mg p.o. daily for 7 days.  He could not tolerate the [name] 180 mg daily and currently on 120 mg started 5 days ago.  INTERVAL HISTORY: Brian [name] 63 y.o. [name] returns to the clinic today for follow-up visit accompanied by his wife.  Discussed the use of AI scribe software for clinical note transcription with the patient, who gave verbal consent to proceed.  History of Present Illness Brian [name] is a 63 year old [name] with metastatic adenocarcinoma who presents for evaluation and repeat blood work. He is accompanied by his wife.  He has a history of metastatic adenocarcinoma with a specific gene translocation. Previously treated with [name]  for seven cycles, which were discontinued due to disease progression. He started brigatinib  at 90 mg daily on May 22, 2024. Initially tolerated brigatinib  well at 90 mg but experienced hallucinations, nausea,  vomiting, and stomach discomfort when the dose was increased to 180 mg. Consequently, the dose was adjusted to 120 mg daily, which he started less than a week ago. He takes four pills at breakfast and has been tolerating this dose better, although he reports having no appetite and  experiencing throat discomfort, which he describes as 'reducing size' and painful, especially when drinking water .  He has lost seven pounds and experiences hallucinations, such as seeing bright lights and loud noises, described as 'flash bangs'. He has difficulty sleeping, getting up multiple times during the night, and requires assistance from his wife. No side effects from steroids in the past.  He has a retired engineer, civil (consulting) who assists him, but she is not available at night. His family, including his daughter from [place], is visiting to spend time with him.    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  MEDICATIONS:  Current Outpatient Medications  Medication Sig Dispense Refill   acetaminophen  (TYLENOL ) 500 MG tablet Take 1,000 mg by mouth every 6 (six) hours as needed (for pain).     albuterol  (VENTOLIN  HFA) 108 (90 Base) MCG/ACT inhaler INHALE 2 PUFFS INTO THE LUNGS EVERY 4 (FOUR) HOURS AS NEEDED FOR WHEEZING OR SHORTNESS OF BREATH. 6.7 each 4   amLODipine  (NORVASC ) 5 MG tablet Take 0.5 tablets (2.5 mg total) by mouth in the morning. 30 tablet 2   atorvastatin  (LIPITOR) 20 MG tablet Take 20 mg by mouth in the morning.     brigatinib  ([name] ) 30 MG tablet Take 4 tablets (120 mg total) by mouth daily. Take with or  without food. Swallow tablets whole. Do not crush or chew. 120 tablet 1   cyclobenzaprine  (FLEXERIL ) 5 MG tablet Take 1 tablet (5 mg total) by mouth 3 (three) times daily as needed for muscle spasms. 30 tablet 0   DULoxetine  (CYMBALTA ) 60 MG capsule Take 1 capsule (60 mg total) by mouth daily. 90 capsule 3   fluticasone  (FLONASE ) 50 MCG/ACT nasal spray Place 1 spray into both nostrils daily. 1 mL 2   folic acid  (FOLVITE ) 1 MG tablet TAKE 1 TABLET BY MOUTH EVERY DAY 30 tablet 2   gabapentin  (NEURONTIN ) 600 MG tablet Take 600 mg by mouth.     levothyroxine  (SYNTHROID ) 100 MCG tablet Take 100 mcg by mouth daily before breakfast.     LORazepam  (ATIVAN ) 0.5 MG tablet 1 tablet p.o. 30 minutes before PET scan repeat x 1 if needed 2 tablet 0   MIRALAX  17 GM/SCOOP powder Take 17 g by mouth daily as needed for mild constipation.     mirtazapine  (REMERON ) 15 MG tablet TAKE 1 TABLET BY MOUTH EVERYDAY AT BEDTIME 90 tablet 1   ondansetron  (ZOFRAN ) 8 MG tablet Take 1 tablet (  8 mg total) by mouth every 8 (eight) hours as needed for nausea or vomiting. 30 tablet 2   prochlorperazine  (COMPAZINE ) 10 MG tablet TAKE 1 TABLET BY MOUTH EVERY 6 HOURS AS NEEDED 30 tablet 2   TRELEGY ELLIPTA  100-62.5-25 MCG/ACT AEPB INHALE 1 PUFF BY MOUTH ONCE DAILY 180 each 3   venlafaxine  (EFFEXOR ) 37.5 MG tablet Take 37.5 mg by mouth in the morning.     No current facility-administered medications for this visit.    SURGICAL HISTORY:  Past Surgical History:  Procedure Laterality Date   COLONOSCOPY     FEMUR FRACTURE SURGERY Left 1969   INGUINAL HERNIA REPAIR Right 06/24/2020   Procedure: RIGHT OPEN INGUINAL HERNIA REPAIR WITH MESH;  Surgeon: Kinsinger, Herlene Righter, MD;  Location: [facility] ORS;  Service: General;  Laterality: Right;   IR IVC FILTER PLMT / S&I PORTER GUID/MOD SED  03/04/2020   IR IVC FILTER RETRIEVAL / S&I PORTER GUID/MOD SED  08/15/2020   IR RADIOLOGIST EVAL & MGMT  08/06/2020   SPINAL CORD STIMULATOR INSERTION N/A  10/05/2023   Procedure: CERVICAL SPINAL CORD STIMULATOR PLACEMENT;  Surgeon: Claudene Penne ORN, MD;  Location: [facility] ORS;  Service: Neurosurgery;  Laterality: N/A;   THORACENTESIS Right 12/10/2023   Procedure: THORACENTESIS;  Surgeon: Kara Dorn NOVAK, MD;  Location: [facility] ENDOSCOPY;  Service: Pulmonary;  Laterality: Right;    REVIEW OF SYSTEMS:  Constitutional: positive for anorexia, fatigue, and weight loss Eyes: negative Ears, nose, mouth, throat, and face: negative Respiratory: positive for dyspnea on exertion Cardiovascular: negative Gastrointestinal: negative Genitourinary:negative Integument/breast: negative Hematologic/lymphatic: negative Musculoskeletal:positive for muscle weakness Neurological: positive for weakness Behavioral/Psych: negative Endocrine: negative Allergic/Immunologic: negative   PHYSICAL EXAMINATION: General appearance: alert, cooperative, fatigued, and no distress Head: Normocephalic, without obvious abnormality, atraumatic Neck: no adenopathy, no JVD, supple, symmetrical, trachea midline, and thyroid  not enlarged, symmetric, no tenderness/mass/nodules Lymph nodes: Cervical, supraclavicular, and axillary nodes normal. Resp: clear to auscultation bilaterally Back: symmetric, no curvature. ROM normal. No CVA tenderness. Cardio: regular rate and rhythm, S1, S2 normal, no murmur, click, rub or gallop GI: soft, non-tender; bowel sounds normal; no masses,  no organomegaly Extremities: extremities normal, atraumatic, no cyanosis or edema Neurologic: Alert and oriented X 3, normal strength and tone. Normal symmetric reflexes. Normal coordination and gait  ECOG PERFORMANCE STATUS: 1 - Symptomatic but completely ambulatory  Blood pressure (!) 156/99, pulse (!) 122, temperature 97.8 F (36.6 C), temperature source Temporal, resp. rate 17, height 5' 8 (1.727 m), weight 110 lb (49.9 kg), SpO2 100%.  LABORATORY DATA: Lab Results  Component Value Date   WBC 15.3 (H)  06/28/2024   HGB 11.8 (L) 06/28/2024   HCT 36.8 (L) 06/28/2024   MCV 95.1 06/28/2024   PLT 391 06/28/2024      Chemistry      Component Value Date/Time   NA 139 06/28/2024 0851   K 4.0 06/28/2024 0851   CL 92 (L) 06/28/2024 0851   CO2 34 (H) 06/28/2024 0851   BUN 14 06/28/2024 0851   CREATININE 0.43 (L) 06/28/2024 0851      Component Value Date/Time   CALCIUM  10.2 06/28/2024 0851   ALKPHOS 137 (H) 06/28/2024 0851   AST 47 (H) 06/28/2024 0851   ALT 17 06/28/2024 0851   BILITOT 0.3 06/28/2024 0851       RADIOGRAPHIC STUDIES: DG Chest 2 View Result Date: 06/15/2024 CLINICAL DATA:  Pleural effusion.  History of lung cancer. EXAM: CHEST - 2 VIEW COMPARISON:  Chest radiograph dated 03/10/2024. FINDINGS:  Small bilateral pleural effusions, right greater than left. The right pleural effusion is slightly increased since the prior radiograph and the left pleural effusion is new. No pneumothorax. Right hilar opacity at right lung base right hilar and right infrahilar opacity, progressed since the prior radiograph. Stable cardiac silhouette. No acute osseous pathology. Shunt catheter noted over the left chest. IMPRESSION: 1. Small bilateral pleural effusions, right greater than left. 2. Right hilar and right infrahilar opacity, progressed since the prior radiograph. Electronically Signed   By: Vanetta Chou M.D.   On: 06/15/2024 13:30    ASSESSMENT AND PLAN: This is a very pleasant 63 years old white [name] with metastatic non-small cell lung cancer, adenocarcinoma with positive ALK gene translocation that was initially diagnosed as a stage IIIb in January 2020 with evidence for disease recurrence and metastasis in June 2021. He was initially treated with a course of concurrent chemoradiation followed by consolidation treatment with immunotherapy and then systemic chemotherapy with 1 cycle of carboplatin , Alimta  and Keytruda  every 4 his tumor was found to have ALK gene translocation. At that  time he was switched to treatment with alectinib since August 2021 status post 44 months of treatment discontinued in March 2025 secondary to disease progression. The patient started treatment with [name]  100 mg p.o. daily but he was unable to take it more than few weeks and this discontinued secondary to significant toxicity with mental status change and confusion. Is here today for evaluation and discussion of other treatment options.  He is currently on treatment with systemic chemotherapy with carboplatin  for AUC of 4 and Alimta  400 Mg/M2 in addition to [name]  15 Mg/KG every 3 weeks.  Status post 7 cycles.  Starting from cycle #7 he has been on maintenance treatment with Alimta  and [name]  every 3 weeks. He has been tolerating the treatment fairly well. His recent CT scan of the chest as well as the PET scan showed evidence for disease progression as evidenced by enlargement and increased hypermetabolism within mediastinal mass and new bilateral hilar nodal metastasis and equivocal isolated left pleural metastasis.  There was also new small volume cervical/thoracic vertebral osseous metastasis. He is currently on treatment with [name] started May 22, 2024.  He started with 90 mg p.o. daily for 1 week but could not tolerate the 180 mg dose.  His treatment was interrupted for 3 weeks before he resumed it again 5 days ago with 120 mg p.o. daily. Assessment and Plan Assessment & Plan Metastatic non-small cell lung cancer with gene translocation Metastatic non-small cell lung cancer with gene translocation, previously treated with multiple lines of therapy including and [name] , discontinued due to disease progression. Currently on brigatinib  120 mg daily, which he has tolerated better than previous doses. Concerns about disease progression and overall condition worsening. Discussion about potential hospice care if no improvement is seen, but he is still interested in active  treatment. - Continue brigatinib  120 mg daily. - Prescribed steroids to boost appetite and energy for one week. - Referred to social worker for support and potential hospice care evaluation. - Scheduled follow-up appointment next week to assess response to treatment.  Cancer-related anorexia, weight loss, and fatigue Significant weight loss of 7 pounds and decreased appetite, likely related to cancer and treatment side effects. Fatigue and lack of energy noted. - Prescribed steroids to boost appetite and energy for one week. - Encouraged nutritional supplements during the week.  Hallucinations, nausea, vomiting, and dysphagia secondary to cancer therapy Experienced hallucinations, nausea, vomiting, and  dysphagia, particularly with water  intake, after increasing brigatinib  dose to 180 mg. Hallucinations include vivid imagery and confusion about surroundings. - Continue brigatinib  120 mg daily. The patient was advised to call immediately if he has any other concerning symptoms in the interval. The patient voices understanding of current disease status and treatment options and is in agreement with the current care plan.  All questions were answered. The patient knows to call the clinic with any problems, questions or concerns. We can certainly see the patient much sooner if necessary.  The total time spent in the appointment was 35 minutes including review of chart and various tests results, discussions about plan of care and coordination of care plan .   Disclaimer: This note was dictated with voice recognition software. Similar sounding words can inadvertently be transcribed and may not be corrected upon review.

## 2024-06-30 ENCOUNTER — Inpatient Hospital Stay: Admitting: Licensed Clinical Social Worker

## 2024-06-30 DIAGNOSIS — C3491 Malignant neoplasm of unspecified part of right bronchus or lung: Secondary | ICD-10-CM

## 2024-06-30 NOTE — Progress Notes (Signed)
 [facility] CSW Progress Note  Clinical Social Worker contacted pt's wife by phone to follow-up on supportive services.    Interventions: Pt's wife reports pt has lost a significant amount of weight and is unable to eat much at all on any given day.  Pt sleeps 60 to 70% of the day and is up several times overnight.  Pt also has difficulty swallowing pills and does not take all of his medications daily.  Pt has verbalized that he would like to continue treatment.  Per spouse pt experiences pain which he is unable to take medication for (gabapentin ) as the pills are too big.  CSW explained the role of palliative care to pt's wife, reassuring her that pt will not need to stop treatment in order to receive palliative services.  Pt's spouse verbalized agreement w/ referral.  CSW to speak w/ Dr. Sherrod 12/1 regarding referral.      Follow Up Plan:  Patient will contact CSW with any support or resource needs    Devere JONELLE Manna, LCSW Clinical Social Worker [facility]

## 2024-07-03 ENCOUNTER — Telehealth: Payer: Self-pay | Admitting: Internal Medicine

## 2024-07-03 ENCOUNTER — Telehealth: Payer: Self-pay

## 2024-07-03 NOTE — Telephone Encounter (Signed)
 Scheduled patient for lab and follow-up on 12/3. Called and left a voicemail with the details.

## 2024-07-03 NOTE — Telephone Encounter (Signed)
 I left a voicemail for patient to return my call to schedule 1 hour palliative care appointment on 07/07/2024 at 10 or 11 per secure chat.

## 2024-07-05 ENCOUNTER — Encounter: Payer: Self-pay | Admitting: Internal Medicine

## 2024-07-05 ENCOUNTER — Inpatient Hospital Stay: Attending: Internal Medicine

## 2024-07-05 ENCOUNTER — Other Ambulatory Visit: Payer: Self-pay

## 2024-07-05 ENCOUNTER — Encounter (HOSPITAL_COMMUNITY): Payer: Self-pay

## 2024-07-05 ENCOUNTER — Observation Stay (HOSPITAL_COMMUNITY)
Admission: EM | Admit: 2024-07-05 | Discharge: 2024-07-05 | Disposition: A | Discharge status: Home / Self Care | Source: Ambulatory Visit | Attending: Emergency Medicine | Admitting: Emergency Medicine

## 2024-07-05 ENCOUNTER — Inpatient Hospital Stay: Admitting: Internal Medicine

## 2024-07-05 ENCOUNTER — Emergency Department (HOSPITAL_COMMUNITY)

## 2024-07-05 ENCOUNTER — Encounter: Payer: Self-pay | Admitting: *Deleted

## 2024-07-05 VITALS — BP 181/123 | HR 132 | Temp 96.7°F | Resp 17 | Ht 68.0 in | Wt 104.0 lb

## 2024-07-05 DIAGNOSIS — I10 Essential (primary) hypertension: Secondary | ICD-10-CM | POA: Diagnosis not present

## 2024-07-05 DIAGNOSIS — C782 Secondary malignant neoplasm of pleura: Secondary | ICD-10-CM | POA: Insufficient documentation

## 2024-07-05 DIAGNOSIS — Z79899 Other long term (current) drug therapy: Secondary | ICD-10-CM | POA: Insufficient documentation

## 2024-07-05 DIAGNOSIS — C7951 Secondary malignant neoplasm of bone: Secondary | ICD-10-CM | POA: Insufficient documentation

## 2024-07-05 DIAGNOSIS — C771 Secondary and unspecified malignant neoplasm of intrathoracic lymph nodes: Secondary | ICD-10-CM | POA: Diagnosis not present

## 2024-07-05 DIAGNOSIS — Z66 Do not resuscitate: Secondary | ICD-10-CM | POA: Diagnosis not present

## 2024-07-05 DIAGNOSIS — Z87891 Personal history of nicotine dependence: Secondary | ICD-10-CM | POA: Insufficient documentation

## 2024-07-05 DIAGNOSIS — Z7901 Long term (current) use of anticoagulants: Secondary | ICD-10-CM | POA: Insufficient documentation

## 2024-07-05 DIAGNOSIS — Z1152 Encounter for screening for COVID-19: Secondary | ICD-10-CM | POA: Insufficient documentation

## 2024-07-05 DIAGNOSIS — C3491 Malignant neoplasm of unspecified part of right bronchus or lung: Secondary | ICD-10-CM | POA: Diagnosis not present

## 2024-07-05 DIAGNOSIS — R918 Other nonspecific abnormal finding of lung field: Secondary | ICD-10-CM | POA: Diagnosis not present

## 2024-07-05 DIAGNOSIS — C7800 Secondary malignant neoplasm of unspecified lung: Principal | ICD-10-CM | POA: Insufficient documentation

## 2024-07-05 DIAGNOSIS — Z515 Encounter for palliative care: Secondary | ICD-10-CM

## 2024-07-05 DIAGNOSIS — C801 Malignant (primary) neoplasm, unspecified: Secondary | ICD-10-CM | POA: Insufficient documentation

## 2024-07-05 DIAGNOSIS — R627 Adult failure to thrive: Secondary | ICD-10-CM | POA: Diagnosis not present

## 2024-07-05 DIAGNOSIS — J90 Pleural effusion, not elsewhere classified: Secondary | ICD-10-CM | POA: Diagnosis not present

## 2024-07-05 DIAGNOSIS — J449 Chronic obstructive pulmonary disease, unspecified: Secondary | ICD-10-CM | POA: Insufficient documentation

## 2024-07-05 DIAGNOSIS — F419 Anxiety disorder, unspecified: Secondary | ICD-10-CM | POA: Insufficient documentation

## 2024-07-05 DIAGNOSIS — A419 Sepsis, unspecified organism: Secondary | ICD-10-CM | POA: Diagnosis not present

## 2024-07-05 DIAGNOSIS — G4700 Insomnia, unspecified: Secondary | ICD-10-CM | POA: Diagnosis not present

## 2024-07-05 DIAGNOSIS — J189 Pneumonia, unspecified organism: Secondary | ICD-10-CM | POA: Insufficient documentation

## 2024-07-05 DIAGNOSIS — R0602 Shortness of breath: Secondary | ICD-10-CM | POA: Diagnosis not present

## 2024-07-05 DIAGNOSIS — D72829 Elevated white blood cell count, unspecified: Secondary | ICD-10-CM | POA: Insufficient documentation

## 2024-07-05 DIAGNOSIS — C3411 Malignant neoplasm of upper lobe, right bronchus or lung: Secondary | ICD-10-CM | POA: Insufficient documentation

## 2024-07-05 DIAGNOSIS — J9601 Acute respiratory failure with hypoxia: Secondary | ICD-10-CM | POA: Insufficient documentation

## 2024-07-05 DIAGNOSIS — E039 Hypothyroidism, unspecified: Secondary | ICD-10-CM | POA: Insufficient documentation

## 2024-07-05 DIAGNOSIS — J9811 Atelectasis: Secondary | ICD-10-CM | POA: Diagnosis not present

## 2024-07-05 DIAGNOSIS — R630 Anorexia: Secondary | ICD-10-CM | POA: Diagnosis not present

## 2024-07-05 DIAGNOSIS — C7931 Secondary malignant neoplasm of brain: Secondary | ICD-10-CM | POA: Insufficient documentation

## 2024-07-05 DIAGNOSIS — R0600 Dyspnea, unspecified: Principal | ICD-10-CM

## 2024-07-05 LAB — I-STAT CHEM 8, ED
BUN: 32 mg/dL — ABNORMAL HIGH (ref 6–20)
Calcium, Ion: 1.13 mmol/L — ABNORMAL LOW (ref 1.15–1.40)
Chloride: 89 mmol/L — ABNORMAL LOW (ref 98–111)
Creatinine, Ser: 0.6 mg/dL — ABNORMAL LOW (ref 0.61–1.24)
Glucose, Bld: 129 mg/dL — ABNORMAL HIGH (ref 70–99)
HCT: 43 % (ref 39.0–52.0)
Hemoglobin: 14.6 g/dL (ref 13.0–17.0)
Potassium: 6.3 mmol/L (ref 3.5–5.1)
Sodium: 136 mmol/L (ref 135–145)
TCO2: 44 mmol/L — ABNORMAL HIGH (ref 22–32)

## 2024-07-05 LAB — CMP (CANCER CENTER ONLY)
ALT: 25 U/L (ref 0–44)
AST: 51 U/L — ABNORMAL HIGH (ref 15–41)
Albumin: 4 g/dL (ref 3.5–5.0)
Alkaline Phosphatase: 136 U/L — ABNORMAL HIGH (ref 38–126)
Anion gap: 13 (ref 5–15)
BUN: 18 mg/dL (ref 6–20)
CO2: 35 mmol/L — ABNORMAL HIGH (ref 22–32)
Calcium: 10.3 mg/dL (ref 8.9–10.3)
Chloride: 93 mmol/L — ABNORMAL LOW (ref 98–111)
Creatinine: 0.46 mg/dL — ABNORMAL LOW (ref 0.61–1.24)
GFR, Estimated: >60 mL/min (ref >60)
Glucose, Bld: 136 mg/dL — ABNORMAL HIGH (ref 70–99)
Potassium: 4.3 mmol/L (ref 3.5–5.1)
Sodium: 141 mmol/L (ref 135–145)
Total Bilirubin: 0.4 mg/dL (ref 0.0–1.2)
Total Protein: 7.7 g/dL (ref 6.5–8.1)

## 2024-07-05 LAB — CBC
HCT: 43 % (ref 39.0–52.0)
Hemoglobin: 13.1 g/dL (ref 13.0–17.0)
MCH: 30.6 pg (ref 26.0–34.0)
MCHC: 30.5 g/dL (ref 30.0–36.0)
MCV: 100.5 fL — ABNORMAL HIGH (ref 80.0–100.0)
Platelets: 320 K/uL (ref 150–400)
RBC: 4.28 MIL/uL (ref 4.22–5.81)
RDW: 14.3 % (ref 11.5–15.5)
WBC: 24.4 K/uL — ABNORMAL HIGH (ref 4.0–10.5)
nRBC: 0 % (ref 0.0–0.2)

## 2024-07-05 LAB — COMPREHENSIVE METABOLIC PANEL WITH GFR
ALT: 23 U/L (ref 0–44)
AST: 46 U/L — ABNORMAL HIGH (ref 15–41)
Albumin: 3.9 g/dL (ref 3.5–5.0)
Alkaline Phosphatase: 126 U/L (ref 38–126)
Anion gap: 7 (ref 5–15)
BUN: 20 mg/dL (ref 6–20)
CO2: 41 mmol/L — ABNORMAL HIGH (ref 22–32)
Calcium: 10.3 mg/dL (ref 8.9–10.3)
Chloride: 94 mmol/L — ABNORMAL LOW (ref 98–111)
Creatinine, Ser: 0.47 mg/dL — ABNORMAL LOW (ref 0.61–1.24)
GFR, Estimated: >60 mL/min (ref >60)
Glucose, Bld: 133 mg/dL — ABNORMAL HIGH (ref 70–99)
Potassium: 4.3 mmol/L (ref 3.5–5.1)
Sodium: 142 mmol/L (ref 135–145)
Total Bilirubin: 0.3 mg/dL (ref 0.0–1.2)
Total Protein: 6.9 g/dL (ref 6.5–8.1)

## 2024-07-05 LAB — RESP PANEL BY RT-PCR (RSV, FLU A&B, COVID)  RVPGX2
Influenza A by PCR: NEGATIVE
Influenza B by PCR: NEGATIVE
Resp Syncytial Virus by PCR: NEGATIVE
SARS Coronavirus 2 by RT PCR: NEGATIVE

## 2024-07-05 LAB — CBC WITH DIFFERENTIAL (CANCER CENTER ONLY)
Abs Immature Granulocytes: 0.14 K/uL — ABNORMAL HIGH (ref 0.00–0.07)
Basophils Absolute: 0 K/uL (ref 0.0–0.1)
Basophils Relative: 0 %
Eosinophils Absolute: 0 K/uL (ref 0.0–0.5)
Eosinophils Relative: 0 %
HCT: 40 % (ref 39.0–52.0)
Hemoglobin: 12.2 g/dL — ABNORMAL LOW (ref 13.0–17.0)
Immature Granulocytes: 1 %
Lymphocytes Relative: 1 %
Lymphs Abs: 0.3 K/uL — ABNORMAL LOW (ref 0.7–4.0)
MCH: 30 pg (ref 26.0–34.0)
MCHC: 30.5 g/dL (ref 30.0–36.0)
MCV: 98.3 fL (ref 80.0–100.0)
Monocytes Absolute: 1.9 K/uL — ABNORMAL HIGH (ref 0.1–1.0)
Monocytes Relative: 7 %
Neutro Abs: 23.2 K/uL — ABNORMAL HIGH (ref 1.7–7.7)
Neutrophils Relative %: 91 %
Platelet Count: 300 K/uL (ref 150–400)
RBC: 4.07 MIL/uL — ABNORMAL LOW (ref 4.22–5.81)
RDW: 14 % (ref 11.5–15.5)
WBC Count: 25.5 K/uL — ABNORMAL HIGH (ref 4.0–10.5)
nRBC: 0 % (ref 0.0–0.2)

## 2024-07-05 LAB — I-STAT CG4 LACTIC ACID, ED
Lactic Acid, Venous: 2.4 mmol/L (ref 0.5–1.9)
Lactic Acid, Venous: 2.4 mmol/L (ref 0.5–1.9)
Lactic Acid, Venous: 2.4 mmol/L (ref 0.5–1.9)

## 2024-07-05 MED ORDER — LACTATED RINGERS IV SOLN: INTRAVENOUS | Status: DC

## 2024-07-05 MED ORDER — HALOPERIDOL 1 MG PO TABS: 0.5000 mg | ORAL_TABLET | ORAL | Status: DC | PRN

## 2024-07-05 MED ORDER — LACTATED RINGERS IV BOLUS
1000.0000 mL | Freq: Once | INTRAVENOUS | Status: AC
  Administered 2024-07-05: 1000 mL via INTRAVENOUS

## 2024-07-05 MED ORDER — AMLODIPINE BESYLATE 5 MG PO TABS: 2.5000 mg | ORAL_TABLET | Freq: Every morning | ORAL | Status: DC

## 2024-07-05 MED ORDER — LACTATED RINGERS IV BOLUS (SEPSIS)
500.0000 mL | Freq: Once | INTRAVENOUS | Status: AC
  Administered 2024-07-05: 500 mL via INTRAVENOUS

## 2024-07-05 MED ORDER — IPRATROPIUM BROMIDE 0.02 % IN SOLN
0.5000 mg | Freq: Once | RESPIRATORY_TRACT | Status: AC
  Administered 2024-07-05: 0.5 mg via RESPIRATORY_TRACT
  Filled 2024-07-05: qty 2.5

## 2024-07-05 MED ORDER — BIOTENE DRY MOUTH MT LIQD: 15.0000 mL | OROMUCOSAL | Status: DC | PRN

## 2024-07-05 MED ORDER — HALOPERIDOL LACTATE 2 MG/ML PO CONC: 0.5000 mg | ORAL | Status: DC | PRN

## 2024-07-05 MED ORDER — ALBUTEROL SULFATE (2.5 MG/3ML) 0.083% IN NEBU
2.5000 mg | INHALATION_SOLUTION | Freq: Once | RESPIRATORY_TRACT | Status: AC
  Administered 2024-07-05: 2.5 mg via RESPIRATORY_TRACT
  Filled 2024-07-05: qty 3

## 2024-07-05 MED ORDER — GLYCOPYRROLATE 0.2 MG/ML IJ SOLN: 0.2000 mg | INTRAMUSCULAR | Status: DC | PRN

## 2024-07-05 MED ORDER — METRONIDAZOLE 500 MG/100ML IV SOLN
500.0000 mg | Freq: Once | INTRAVENOUS | Status: AC
  Administered 2024-07-05: 500 mg via INTRAVENOUS
  Filled 2024-07-05: qty 100

## 2024-07-05 MED ORDER — ENOXAPARIN SODIUM 40 MG/0.4ML IJ SOSY
40.0000 mg | PREFILLED_SYRINGE | Freq: Every day | INTRAMUSCULAR | Status: DC
  Administered 2024-07-05: 40 mg via SUBCUTANEOUS
  Filled 2024-07-05: qty 0.4

## 2024-07-05 MED ORDER — LACTATED RINGERS IV BOLUS (SEPSIS)
1000.0000 mL | Freq: Once | INTRAVENOUS | Status: AC
  Administered 2024-07-05: 1000 mL via INTRAVENOUS

## 2024-07-05 MED ORDER — MORPHINE SULFATE (PF) 2 MG/ML IV SOLN: 1.0000 mg | INTRAVENOUS | Status: DC | PRN

## 2024-07-05 MED ORDER — PREDNISONE 20 MG PO TABS: 40.0000 mg | ORAL_TABLET | Freq: Every day | ORAL | Status: DC

## 2024-07-05 MED ORDER — POLYVINYL ALCOHOL 1.4 % OP SOLN: 1.0000 [drp] | Freq: Four times a day (QID) | OPHTHALMIC | Status: DC | PRN

## 2024-07-05 MED ORDER — SODIUM CHLORIDE 0.9 % IV SOLN
500.0000 mg | Freq: Every day | INTRAVENOUS | Status: DC
  Administered 2024-07-05: 500 mg via INTRAVENOUS
  Filled 2024-07-05: qty 5

## 2024-07-05 MED ORDER — VANCOMYCIN HCL IN DEXTROSE 1-5 GM/200ML-% IV SOLN
1000.0000 mg | Freq: Once | INTRAVENOUS | Status: AC
  Administered 2024-07-05: 1000 mg via INTRAVENOUS
  Filled 2024-07-05: qty 200

## 2024-07-05 MED ORDER — GLYCOPYRROLATE 1 MG PO TABS: 1.0000 mg | ORAL_TABLET | ORAL | Status: DC | PRN

## 2024-07-05 MED ORDER — ONDANSETRON HCL 4 MG PO TABS: 4.0000 mg | ORAL_TABLET | Freq: Four times a day (QID) | ORAL | Status: DC | PRN

## 2024-07-05 MED ORDER — ALBUTEROL SULFATE (2.5 MG/3ML) 0.083% IN NEBU: 2.5000 mg | INHALATION_SOLUTION | RESPIRATORY_TRACT | Status: DC | PRN

## 2024-07-05 MED ORDER — HALOPERIDOL LACTATE 5 MG/ML IJ SOLN: 0.5000 mg | INTRAMUSCULAR | Status: DC | PRN

## 2024-07-05 MED ORDER — SODIUM CHLORIDE 0.9 % IV SOLN: 1.0000 g | Freq: Every day | INTRAVENOUS | Status: DC

## 2024-07-05 MED ORDER — ACETAMINOPHEN 650 MG RE SUPP: 650.0000 mg | Freq: Four times a day (QID) | RECTAL | Status: DC | PRN

## 2024-07-05 MED ORDER — ONDANSETRON HCL 4 MG/2ML IJ SOLN: 4.0000 mg | Freq: Four times a day (QID) | INTRAMUSCULAR | Status: DC | PRN

## 2024-07-05 MED ORDER — SODIUM CHLORIDE 0.9 % IV SOLN
2.0000 g | Freq: Once | INTRAVENOUS | Status: AC
  Administered 2024-07-05: 2 g via INTRAVENOUS
  Filled 2024-07-05: qty 12.5

## 2024-07-05 MED ORDER — ACETAMINOPHEN 325 MG PO TABS: 650.0000 mg | ORAL_TABLET | Freq: Four times a day (QID) | ORAL | Status: DC | PRN

## 2024-07-10 LAB — CULTURE, BLOOD (ROUTINE X 2)
Culture: NO GROWTH
Culture: NO GROWTH
Special Requests: ADEQUATE

## 2024-08-03 NOTE — Plan of Care (Signed)
 Notified by RN that patient is agonal breathing, hypoxic on 5L [place] and BP dropping.   Patient is ashen in color, his blood pressures in the 40s.  He is actively dying.  Wife is at the bedside, we had already spoken previously about his very poor prognosis and likelihood that he would be best served by transitioning to comfort care.  He is DNR/DNI.  Spoke to her once again at the bedside, explained that I think the patient is actively dying. She agrees with transition to full comfort care, all monitoring, and IV therapies will be discontinued. Comfort care orders in place, also discussed face-to-face with bedside RN.

## 2024-08-03 NOTE — Death Summary Note (Signed)
 DEATH SUMMARY   Patient Details  Name: Brian [name] MRN: [id number] DOB: [birth year] ERE:Mnaapwd, Lamar LABOR, MD Admission/Discharge Information   Admit Date:  07-10-24  Date of Death: Date of Death: July 10, 2024  Time of Death: Time of Death: July 13, 1521 Museum/gallery Curator and facilities manager confirmed)  Length of Stay: 0   Principle Cause of death: Metastatic lung cancer  Hospital Diagnoses: Metastatic lung cancer Failure to thrive Acute hypoxic respiratory failure Suspected sepsis due to pneumonia  Hospital Course: This is an unfortunate 64 year old gentleman with a history of stage IIIb metastatic non-small cell lung cancer that has been recently aggressively progressive despite multiple rounds of chemotherapy, immunotherapy and radiation who is admitted to the hospital with acute on chronic hypoxic respiratory failure, possible severe sepsis due to community-acquired pneumonia.  He was treated with IV fluids, empiric IV antibiotics.  Per my extensive discussion with the patient's wife at the bedside at the time of admission, he is DNR/DNI, palliative care was consulted and the patient's wife was advised that he may pass away during this hospitalization.  Patient's wife understands that he is quite sick, she mentions that the patient had stated multiple times that he would not want to live like this and he was happy that there grown children and granddaughter had visited them recently, because he stated this is likely the last time.   At the time of admission, decision was made to treat the patient with empiric IV antibiotics and give him 24 to 40 hours to see if he turned around and improved clinically.  Barring that, plan was for the patient to potentially be discharged home or to inpatient facility with hospice with a goal of comfort.  Unfortunately, after the patient was admitted to the hospital, within a couple of hours he became even less responsive than before, became ashen in appearance, his  blood pressure dropped to the 40s.  At that point in time, I discussed once again with the patient's wife at the bedside and he was transitioned fully to comfort care.  He expired naturally at 07/13/21 with his wife at the bedside.  Consultations: Palliative care was consulted, with the patient expired before he could be seen.  The results of significant diagnostics from this hospitalization (including imaging, microbiology, ancillary and laboratory) are listed below for reference.   Significant Diagnostic Studies: DG Chest 2 View Result Date: July 10, 2024 EXAM: 2 VIEW(S) XRAY OF THE CHEST July 10, 2024 11:48:00 AM COMPARISON: 06/15/2024 CLINICAL HISTORY: SOB. History of lung cancer. FINDINGS: LINES, TUBES AND DEVICES: Spinal stimulator noted. LUNGS AND PLEURA: Lungs are hypoinflated. Persistent opacification over the right mid to lower lung likely small effusion with associated basal atelectasis. Persistent opacification in the right perihilar region. Small left pleural effusion likely associated with basal atelectasis without significant change. Infection over the lung bases is possible. No pneumothorax. HEART AND MEDIASTINUM: No acute abnormality of the cardiac and mediastinal silhouettes. BONES AND SOFT TISSUES: Mottled lucency of left humerus. IMPRESSION: 1. Hypoinflated lungs with persistent right mid to lower lung opacification, likely representing small effusion and basal atelectasis. Persistent right perihilar opacification. Infection is possible. 2. Small left pleural effusion with associated basal atelectasis, without significant change. 3. Mottled lucency of the left humeral diaphysis which can be seen with metastatic disease. Recommend clinical correlation and further evaluation with a bone scan if indicated . Electronically signed by: Toribio Agreste MD 2024/07/10 12:27 PM EST RP Workstation: [id number]   DG Chest 2 View Result Date: 06/15/2024 CLINICAL DATA:  Pleural effusion.  History of lung cancer.  EXAM: CHEST - 2 VIEW COMPARISON:  Chest radiograph dated 03/10/2024. FINDINGS: Small bilateral pleural effusions, right greater than left. The right pleural effusion is slightly increased since the prior radiograph and the left pleural effusion is new. No pneumothorax. Right hilar opacity at right lung base right hilar and right infrahilar opacity, progressed since the prior radiograph. Stable cardiac silhouette. No acute osseous pathology. Shunt catheter noted over the left chest. IMPRESSION: 1. Small bilateral pleural effusions, right greater than left. 2. Right hilar and right infrahilar opacity, progressed since the prior radiograph. Electronically Signed   By: Vanetta Chou M.D.   On: 06/15/2024 13:30    Microbiology: Recent Results (from the past 240 hours)  Resp panel by RT-PCR (RSV, Flu A&B, Covid) Anterior Nasal Swab     Status: None   Collection Time:  10:49 AM   Specimen: Anterior Nasal Swab  Result Value Ref Range Status   SARS Coronavirus 2 by RT PCR NEGATIVE NEGATIVE Final    Comment: (NOTE) SARS-CoV-2 target nucleic acids are NOT DETECTED.  The SARS-CoV-2 RNA is generally detectable in upper respiratory specimens during the acute phase of infection. The lowest concentration of SARS-CoV-2 viral copies this assay can detect is 138 copies/mL. A negative result does not preclude SARS-Cov-2 infection and should not be used as the sole basis for treatment or other patient management decisions. A negative result may occur with  improper specimen collection/handling, submission of specimen other than nasopharyngeal swab, presence of viral mutation(s) within the areas targeted by this assay, and inadequate number of viral copies(<138 copies/mL). A negative result must be combined with clinical observations, patient history, and epidemiological information. The expected result is Negative.  Fact Sheet for Patients:  bloggercourse.com  Fact Sheet  for Healthcare Providers:  seriousbroker.it  This test is no t yet approved or cleared by the [place]  FDA and  has been authorized for detection and/or diagnosis of SARS-CoV-2 by FDA under an Emergency Use Authorization (EUA). This EUA will remain  in effect (meaning this test can be used) for the duration of the COVID-19 declaration under Section 564(b)(1) of the Act, 21 U.S.C.section 360bbb-3(b)(1), unless the authorization is terminated  or revoked sooner.       Influenza A by PCR NEGATIVE NEGATIVE Final   Influenza B by PCR NEGATIVE NEGATIVE Final    Comment: (NOTE) The Xpert Xpress SARS-CoV-2/FLU/RSV plus assay is intended as an aid in the diagnosis of influenza from Nasopharyngeal swab specimens and should not be used as a sole basis for treatment. Nasal washings and aspirates are unacceptable for Xpert Xpress SARS-CoV-2/FLU/RSV testing.  Fact Sheet for Patients: bloggercourse.com  Fact Sheet for Healthcare Providers: seriousbroker.it  This test is not yet approved or cleared by the [place]  FDA and has been authorized for detection and/or diagnosis of SARS-CoV-2 by FDA under an Emergency Use Authorization (EUA). This EUA will remain in effect (meaning this test can be used) for the duration of the COVID-19 declaration under Section 564(b)(1) of the Act, 21 U.S.C. section 360bbb-3(b)(1), unless the authorization is terminated or revoked.     Resp Syncytial Virus by PCR NEGATIVE NEGATIVE Final    Comment: (NOTE) Fact Sheet for Patients: bloggercourse.com  Fact Sheet for Healthcare Providers: seriousbroker.it  This test is not yet approved or cleared by the [place]  FDA and has been authorized for detection and/or diagnosis of SARS-CoV-2 by FDA under an Emergency Use Authorization (EUA). This EUA will remain in effect (meaning  this test can be used) for the duration of the COVID-19 declaration under Section 564(b)(1) of the Act, 21 U.S.C. section 360bbb-3(b)(1), unless the authorization is terminated or revoked.  Performed at [facility], [address] [place], [place] [address]     Time spent: 15 minutes  Signed: Allexa Acoff CHRISTELLA Gail, MD 

## 2024-08-03 NOTE — Sepsis Progress Note (Signed)
 Code Sepsis protocol being monitored by eLink.

## 2024-08-03 NOTE — Progress Notes (Signed)
 [facility] [phone]   [phone]  OFFICE PROGRESS NOTE  [name], MD [address] [place] [address]  DIAGNOSIS:  Metastatic non-small cell lung cancer initially diagnosed as stage IIIB (T1c, N3, M0) non-small cell lung cancer, adenocarcinoma presented with right upper lobe lung mass in addition to mediastinal Brian bilateral supraclavicular lymphadenopathy diagnosed in January 2020.  He had evidence of disease recurrence in June 2021 with adenopathy in Brian subcarinal, level 3 left neck lymph node, Brian lymph node at Brian curious of Brian diaphragm Brian in Brian upper abdomen.   PD-L1: 10%   Guardant 360 molecular studies showed no actionable mutation   [facility] Testing:   Biomarker Findings Microsatellite status - MS-Stable Tumor Mutational Burden - 4 Muts/Mb Genomic Findings For a complete list of Brian genes assayed, please refer to Brian Appendix. ALK EML4-ALK fusion (Variant 2) SMARCB1 R377C CTNNB1 S45P CDKN2A/B CDKN2B loss, CDKN2A loss CXCR4 W106* TP53 splice site 672G>A 7 Disease relevant genes with no reportable alterations: BRAF, EGFR, ERBB2, KRAS, MET, RET, ROS1   PRIOR THERAPY: 1) Concurrent chemoradiation with weekly carboplatin  for AUC of 2 Brian paclitaxel  45 mg/M2.  Status post 6 cycles.  Last dose was given on October 10, 2018 with stable disease. 2) Radiation treatment to Brian enlarging right cervical lymph nodes under Brian care of Dr. Shannon. First treatment 03/06/2019. Last treatment scheduled on 04/13/2019 3) Consolidation treatment with immunotherapy with Imfinzi  10 mg/KG every 2 weeks.  First dose November 17, 2018.  Status post 26 cycles. 4) Systemic chemotherapy with carboplatin  for an AUC of 5, Alimta  500 mg/m2, Brian Keytruda  200 mg IV every 3 weeks. First dose expected on 02/14/20. Status post 1 cycle.  This treatment was discontinued after Brian patient was found to have ALK gene translocation on Brian molecular studies by  foundation 1. 5) SBRT to Brian left lower lobe lung nodule. 6) [name] 600 mg p.o. twice daily.  He started Brian first dose on March 08, 2020.   Status post 44 months of treatment.  Discontinued in March 2025 due to disease progression 7) [name] 100 mg daily.  Discontinued in April 2025 due to severe intolerance due to mental status changes/confusion  8) Radiation to Brian bilateral cervical lymph nodes under Brian care of Dr. Dewey last day expected on 11/17/2023   9) Palliative systemic chemotherapy with carboplatin  for an AUC of 4 (dose reduced), Alimta  500 mg/m (dose reduced), Brian [name]  IV every 3 weeks.  First dose expected on 12/23/2023.  Status post 7 cycles. 10) [name] 90 mg p.o. daily for 7 days.  He could not tolerate Brian [name] 180 mg daily Brian currently on 120 mg started 10 days ago.  This treatment was discontinued secondary to decline of his condition.  CURRENT THERAPY: Consideration of hospice Brian palliative care.  INTERVAL HISTORY: Brian [name] 64 y.o. male returns to Brian clinic today for follow-up visit accompanied by his wife.  Discussed Brian use of AI scribe software for clinical note transcription with Brian patient, who gave verbal consent to proceed.  History of Present Illness Brian [name] is a 64 year old male with metastatic non-small cell lung cancer who presents with difficulty breathing. He is accompanied by his wife.  He has a history of metastatic non-small cell lung cancer, adenocarcinoma, initially diagnosed as stage III B in January 2020, with disease recurrence in June 2021. His cancer exhibits PD-L1 expression of  10% Brian a positive ALK gene translocation. He has undergone multiple treatments including concurrent chemoradiation, consolidation immunotherapy, systemic chemotherapy with carboplatin  Brian Alimta , Keytruda , Brian alectinib for 44 months, which was discontinued in March 2025 due to disease progression.  Treatment with lorlatinib  was also attempted but discontinued due to mental status changes, confusion, Brian hallucinations. He was subsequently treated with systemic chemotherapy with carboplatin , Alimta , Brian nivolumab, which was discontinued. Most recently, he started treatment with brigatinib , currently taking 120 mg daily.  He experiences worsening difficulty breathing, particularly in busy environments. His wife notes increased difficulty in breathing, body twitching, Brian decreased appetite, with him consuming only minimal amounts of water  Brian coffee. He has poor sleep, waking multiple times during Brian night, Brian increased anxiety Brian nervousness. He has trouble swallowing his cancer medication, often spitting it out.  No fever, but he feels hot Brian experiences chills Brian shaking. His wife mentions that he felt hot despite Brian room temperature being set at 70 degrees, which was comfortable for her.  His current medication includes brigatinib  120 mg daily.    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  MEDICATIONS:  Current Outpatient Medications  Medication Sig Dispense Refill   acetaminophen  (TYLENOL ) 500 MG tablet Take 1,000 mg by mouth every 6 (six) hours as needed (for pain).     albuterol  (VENTOLIN  HFA) 108 (90 Base)  MCG/ACT inhaler INHALE 2 PUFFS INTO Brian LUNGS EVERY 4 (FOUR) HOURS AS NEEDED FOR WHEEZING OR SHORTNESS OF BREATH. 6.7 each 4   amLODipine  (NORVASC ) 5 MG tablet Take 0.5 tablets (2.5 mg total) by mouth in Brian morning. 30 tablet 2   atorvastatin  (LIPITOR) 20 MG tablet Take 20 mg by mouth in Brian morning.     brigatinib  ([name] ) 30 MG tablet Take 4 tablets (120 mg total) by mouth daily. Take with or without food. Swallow tablets whole. Do not crush or chew. 120 tablet 1   cyclobenzaprine  (FLEXERIL ) 5 MG tablet Take 1 tablet (5 mg total) by mouth 3 (three) times daily as needed for muscle spasms. 30 tablet 0   DULoxetine  (CYMBALTA ) 60 MG capsule Take 1 capsule (60 mg total) by mouth daily. 90 capsule 3   fluticasone  (FLONASE ) 50 MCG/ACT nasal spray Place 1 spray into both nostrils daily. 1 mL 2   folic acid  (FOLVITE ) 1 MG tablet TAKE 1 TABLET BY MOUTH EVERY DAY 30 tablet 2   gabapentin  (NEURONTIN ) 600 MG tablet Take 600 mg by mouth.     levothyroxine  (SYNTHROID ) 100 MCG tablet Take 100 mcg by mouth daily before breakfast.     LORazepam  (ATIVAN ) 0.5 MG tablet 1 tablet p.o. 30 minutes before PET scan repeat x 1 if needed 2 tablet 0   methylPREDNISolone  (MEDROL  DOSEPAK) 4  MG TBPK tablet Use as instructed 21 tablet 0   MIRALAX  17 GM/SCOOP powder Take 17 g by mouth daily as needed for mild constipation.     mirtazapine  (REMERON ) 15 MG tablet TAKE 1 TABLET BY MOUTH EVERYDAY AT BEDTIME 90 tablet 1   ondansetron  (ZOFRAN ) 8 MG tablet Take 1 tablet (8 mg total) by mouth every 8 (eight) hours as needed for nausea or vomiting. 30 tablet 2   prochlorperazine  (COMPAZINE ) 10 MG tablet TAKE 1 TABLET BY MOUTH EVERY 6 HOURS AS NEEDED 30 tablet 2   TRELEGY ELLIPTA  100-62.5-25 MCG/ACT AEPB INHALE 1 PUFF BY MOUTH ONCE DAILY 180 each 3   venlafaxine  (EFFEXOR ) 37.5 MG tablet Take 37.5 mg by mouth in Brian morning.     No current facility-administered medications for this visit.    SURGICAL HISTORY:  Past Surgical  History:  Procedure Laterality Date   COLONOSCOPY     FEMUR FRACTURE SURGERY Left 1969   INGUINAL HERNIA REPAIR Right 06/24/2020   Procedure: RIGHT OPEN INGUINAL HERNIA REPAIR WITH MESH;  Surgeon: Kinsinger, Herlene Righter, MD;  Location: [facility] OR[facility];  Service: General;  Laterality: Right;   IR IVC FILTER PLMT / S&I PORTER GUID/MOD SED  03/04/2020   IR IVC FILTER RETRIEVAL / S&I PORTER GUID/MOD SED  08/15/2020   IR RADIOLOGIST EVAL & MGMT  08/06/2020   SPINAL CORD STIMULATOR INSERTION N/A 10/05/2023   Procedure: CERVICAL SPINAL CORD STIMULATOR PLACEMENT;  Surgeon: Claudene Penne ORN, MD;  Location: [facility] ORS;  Service: Neurosurgery;  Laterality: N/A;   THORACENTESIS Right 12/10/2023   Procedure: THORACENTESIS;  Surgeon: Kara Dorn NOVAK, MD;  Location: [facility] ENDOSCOPY;  Service: Pulmonary;  Laterality: Right;    REVIEW OF SYSTEMS:  Constitutional: positive for anorexia, fatigue, Brian weight loss Eyes: negative Ears, nose, mouth, throat, Brian face: negative Respiratory: positive for dyspnea on exertion Cardiovascular: negative Gastrointestinal: negative Genitourinary:negative Integument/breast: negative Hematologic/lymphatic: negative Musculoskeletal:positive for muscle weakness Neurological: positive for weakness Behavioral/Psych: negative Endocrine: negative Allergic/Immunologic: negative   PHYSICAL EXAMINATION: General appearance: alert, cooperative, fatigued, Brian no distress Head: Normocephalic, without obvious abnormality, atraumatic Neck: no adenopathy, no JVD, supple, symmetrical, trachea midline, Brian thyroid  not enlarged, symmetric, no tenderness/mass/nodules Lymph nodes: Cervical, supraclavicular, Brian axillary nodes normal. Resp: clear to auscultation bilaterally Back: symmetric, no curvature. ROM normal. No CVA tenderness. Cardio: regular rate Brian rhythm, S1, S2 normal, no murmur, click, rub or gallop GI: soft, non-tender; bowel sounds normal; no masses,  no organomegaly Extremities:  extremities normal, atraumatic, no cyanosis or edema Neurologic: Alert Brian oriented X 3, normal strength Brian tone. Normal symmetric reflexes. Normal coordination Brian gait  ECOG PERFORMANCE STATUS: 1 - Symptomatic but completely ambulatory  Temperature (!) 96.7 F (35.9 C), temperature source Temporal, resp. rate 17, height 5' 8 (1.727 m), weight 104 lb (47.2 kg).  LABORATORY DATA: Lab Results  Component Value Date   WBC 25.5 (H)    HGB 12.2 (L)    HCT 40.0    MCV 98.3    PLT 300       Chemistry      Component Value Date/Time   NA 139 06/28/2024 0851   K 4.0 06/28/2024 0851   CL 92 (L) 06/28/2024 0851   CO2 34 (H) 06/28/2024 0851   BUN 14 06/28/2024 0851   CREATININE 0.43 (L) 06/28/2024 0851      Component Value Date/Time   CALCIUM  10.2 06/28/2024 0851   ALKPHOS 137 (H) 06/28/2024 0851   AST 47 (H) 06/28/2024 9148  ALT 17 06/28/2024 0851   BILITOT 0.3 06/28/2024 0851       RADIOGRAPHIC STUDIES: DG Chest 2 View Result Date: 06/15/2024 CLINICAL DATA:  Pleural effusion.  History of lung cancer. EXAM: CHEST - 2 VIEW COMPARISON:  Chest radiograph dated 03/10/2024. FINDINGS: Small bilateral pleural effusions, right greater than left. Brian right pleural effusion is slightly increased since Brian prior radiograph Brian Brian left pleural effusion is new. No pneumothorax. Right hilar opacity at right lung base right hilar Brian right infrahilar opacity, progressed since Brian prior radiograph. Stable cardiac silhouette. No acute osseous pathology. Shunt catheter noted over Brian left chest. IMPRESSION: 1. Small bilateral pleural effusions, right greater than left. 2. Right hilar Brian right infrahilar opacity, progressed since Brian prior radiograph. Electronically Signed   By: Vanetta Chou M.D.   On: 06/15/2024 13:30    ASSESSMENT Brian PLAN: This is a very pleasant 64 years old white male with metastatic non-small cell lung cancer,  adenocarcinoma with positive ALK gene translocation that was initially diagnosed as a stage IIIb in January 2020 with evidence for disease recurrence Brian metastasis in June 2021. He was initially treated with a course of concurrent chemoradiation followed by consolidation treatment with immunotherapy Brian then systemic chemotherapy with 1 cycle of carboplatin , Alimta  Brian Keytruda  every 4 his tumor was found to have ALK gene translocation. At that time he was switched to treatment with alectinib since August 2021 status post 44 months of treatment discontinued in March 2025 secondary to disease progression. Brian patient started treatment with [name]  100 mg p.o. daily but he was unable to take it more than few weeks Brian this discontinued secondary to significant toxicity with mental status change Brian confusion. Is here today for evaluation Brian discussion of other treatment options.  He is currently on treatment with systemic chemotherapy with carboplatin  for AUC of 4 Brian Alimta  400 Mg/M2 in addition to [name]  15 Mg/KG every 3 weeks.  Status post 7 cycles.  Starting from cycle #7 he has been on maintenance treatment with Alimta  Brian [name]  every 3 weeks. He has been tolerating Brian treatment fairly well. His recent CT scan of Brian chest as well as Brian PET scan showed evidence for disease progression as evidenced by enlargement Brian increased hypermetabolism within mediastinal mass Brian new bilateral hilar nodal metastasis Brian equivocal isolated left pleural metastasis.  There was also new small volume cervical/thoracic vertebral osseous metastasis. He is currently on treatment with [name] started May 22, 2024.  He started with 90 mg p.o. daily for 1 week but could not tolerate Brian 180 mg dose.  His treatment was interrupted for 3 weeks before he resumed it again 10 days ago with 120 mg p.o. daily.  This will be discontinued secondary to intolerance Brian decline of his condition. Assessment Brian  Plan Assessment & Plan Metastatic non-small cell lung cancer, adenocarcinoma, ALK-positive, with disease progression Metastatic non-small cell lung cancer, adenocarcinoma, ALK-positive, with disease progression. Previously treated with multiple lines of therapy including chemoradiation, immunotherapy, Brian targeted therapy. Currently on brigatinib , but experiencing disease progression. Decision made to discontinue cancer treatment due to advanced disease Brian poor prognosis.  Dyspnea Brian respiratory decline in Brian setting of advanced lung cancer Worsening dyspnea Brian respiratory decline, likely due to advanced lung cancer. Oxygen  saturation is 98%, but respiratory status is concerning. High heart rate Brian blood pressure noted, possibly related to respiratory distress. - Admitted to hospital for further evaluation Brian management  Anorexia Brian poor oral intake associated with  advanced malignancy Significant anorexia Brian poor oral intake, likely related to advanced malignancy Brian associated symptoms. Reports minimal intake of food Brian fluids.  Insomnia Brian anxiety related to advanced cancer Increased anxiety Brian insomnia, likely related to advanced cancer Brian associated symptoms. Reports difficulty sleeping Brian increased anxiety.  Hypertension Elevated blood pressure readings, possibly related to anxiety Brian respiratory distress. Blood pressure readings were 181/118 Brian 157/123.  Leukocytosis, possible steroid effect or infection Leukocytosis noted, possibly due to recent prednisone  use or underlying infection. Differential includes steroid effect versus infection.  Tremor Brian abnormal involuntary movements Reports of tremor Brian abnormal involuntary movements, possibly related to medication side effects or disease progression.  Do Not Resuscitate (DNR) status Brian transition to hospice care Discussion of DNR status Brian transition to hospice care due to advanced disease Brian poor prognosis. He Brian  his wife agree to DNR status Brian transition to hospice care. - Initiated DNR status - Arranged for transition to hospice care I will see Brian patient on as-needed basis at this point.  Brian patient voices understanding of current disease status Brian treatment options Brian is in agreement with Brian current care plan.  All questions were answered. Brian patient knows to call Brian clinic with any problems, questions or concerns. We can certainly see Brian patient much sooner if necessary.  Brian total time spent in Brian appointment was 35 minutes including review of chart Brian various tests results, discussions about plan of care Brian coordination of care plan .   Disclaimer: This note was dictated with voice recognition software. Similar sounding words can inadvertently be transcribed Brian may not be corrected upon review.

## 2024-08-03 NOTE — H&P (Signed)
 History and Physical  Brian [name] FMW:[id number] DOB: May 09, 1961 DOA:   PCP: [name], MD   Chief Complaint: Generalized weakness, failure to thrive  HPI: Brian [name] is a 64 y.o. male with medical history significant for stage IIIb metastatic non-small cell lung cancer that has recently been aggressively progressive despite multiple rounds of chemotherapy, immunotherapy, and radiation now being admitted to the hospital with possible sepsis due to community-acquired pneumonia.  History provided by the patient's wife who is at the bedside and is a good historian.  She states that over the last few weeks, he has been progressively worsening, with weight loss, lack of appetite and particularly in the last few days he has hardly eaten or drink anything, he seems to be intermittently quite confused, even agitated.  He was seen by his oncologist Dr. Sherrod 7 days ago, at which time he was given an empiric course of oral steroids.  This did not really seem to help much at all, they had a follow-up appointment today, given the patient's severe weakness difficulty breathing he was referred to the emergency department this morning for further evaluation and management.  Workup as detailed below shows evidence of tachycardia, tachypnea, elevated lactic acidosis and chest x-ray with persistent lung opacification.  He was started on empiric IV antibiotics, and hospitalist was contacted for admission.  On further discussion with the patient's wife, she understands that he is dying of cancer.  She does not want him to suffer, but she is amenable to hospital admission for attempt to treat suspected pneumonia.  Review of Systems: Please see HPI for pertinent positives and negatives. A complete 10 system review of systems are otherwise negative.  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    Non-small cell cancer of right lung (HCC) 08/23/2018   Pneumonia    Pre-diabetes    Radiation-induced brachial plexopathy    Recurrent right pleural effusion    Secondary malignant neoplasm of brain and spinal cord (HCC)    Subarachnoid hemorrhage (HCC) 2016   Voice hoarseness    Past Surgical History:  Procedure Laterality Date   COLONOSCOPY     FEMUR FRACTURE SURGERY Left 1969   INGUINAL HERNIA REPAIR Right 06/24/2020   Procedure: RIGHT OPEN INGUINAL HERNIA REPAIR WITH MESH;  Surgeon: Kinsinger, Herlene Righter, MD;  Location: [facility] ORS;  Service: General;  Laterality: Right;   IR IVC FILTER PLMT / S&I PORTER GUID/MOD SED  03/04/2020   IR IVC FILTER RETRIEVAL / S&I PORTER GUID/MOD SED  08/15/2020   IR RADIOLOGIST EVAL & MGMT  08/06/2020   SPINAL CORD STIMULATOR INSERTION N/A 10/05/2023   Procedure: CERVICAL SPINAL CORD STIMULATOR PLACEMENT;  Surgeon: Claudene Penne ORN, MD;  Location: [facility] ORS;  Service: Neurosurgery;  Laterality: N/A;   THORACENTESIS Right 12/10/2023   Procedure: THORACENTESIS;  Surgeon: Kara Dorn NOVAK, MD;  Location: [facility] ENDOSCOPY;  Service: Pulmonary;  Laterality: Right;   Social History:  reports that he has quit smoking. His smoking use included cigarettes. He has never used smokeless tobacco. He reports current alcohol use of about 5.0 standard drinks of alcohol per  week. He reports that he does not use drugs.  Allergies  Allergen Reactions   Lorazepam  Anxiety    Per MD, patient gets restless/delirious after Ativan  administration.    Family History  Problem Relation Age of Onset   Diabetes Mother    Diabetes Brother    Colon cancer Neg Hx    Stomach cancer Neg Hx    Thyroid  cancer Neg Hx      Prior to Admission medications    Medication Sig Start Date End Date Taking? Authorizing Provider  acetaminophen  (TYLENOL ) 500 MG tablet Take 1,000 mg by mouth every 6 (six) hours as needed (for pain).    [provider]  albuterol  (VENTOLIN  HFA) 108 (90 Base) MCG/ACT inhaler INHALE 2 PUFFS INTO THE LUNGS EVERY 4 (FOUR) HOURS AS NEEDED FOR WHEEZING OR SHORTNESS OF BREATH. 11/07/20   Tonna Redell SQUIBB, NP  amLODipine  (NORVASC ) 5 MG tablet Take 0.5 tablets (2.5 mg total) by mouth in the morning. 11/22/23   Will Almarie MATSU, MD  atorvastatin  (LIPITOR) 20 MG tablet Take 20 mg by mouth in the morning.    [provider]  brigatinib  ([name] ) 30 MG tablet Take 4 tablets (120 mg total) by mouth daily. Take with or without food. Swallow tablets whole. Do not crush or chew. 06/15/24   Heilingoetter, Cassandra L, PA-C  cyclobenzaprine  (FLEXERIL ) 5 MG tablet Take 1 tablet (5 mg total) by mouth 3 (three) times daily as needed for muscle spasms. 04/10/24   Ulis Bottcher, PA-C  DULoxetine  (CYMBALTA ) 60 MG capsule Take 1 capsule (60 mg total) by mouth daily. 02/17/24   Leigh Venetia CROME, MD  fluticasone  (FLONASE ) 50 MCG/ACT nasal spray Place 1 spray into both nostrils daily. 11/23/23   Will Almarie MATSU, MD  folic acid  (FOLVITE ) 1 MG tablet TAKE 1 TABLET BY MOUTH EVERY DAY 03/14/24   Sherrod Sherrod, MD  gabapentin  (NEURONTIN ) 600 MG tablet Take 600 mg by mouth. 02/01/24   [provider]  levothyroxine  (SYNTHROID ) 100 MCG tablet Take 100 mcg by mouth daily before breakfast.    [provider]  LORazepam  (ATIVAN ) 0.5 MG tablet 1 tablet p.o. 30 minutes before PET scan repeat x 1 if needed 05/01/24   Sherrod Sherrod, MD  methylPREDNISolone  (MEDROL  DOSEPAK) 4 MG TBPK tablet Use as instructed 06/28/24   Sherrod Sherrod, MD  MIRALAX  17 GM/SCOOP powder Take 17 g by mouth daily as needed for mild constipation.    [provider]  mirtazapine  (REMERON ) 15 MG tablet TAKE 1 TABLET BY MOUTH EVERYDAY AT BEDTIME 06/22/24    Sherrod Sherrod, MD  ondansetron  (ZOFRAN ) 8 MG tablet Take 1 tablet (8 mg total) by mouth every 8 (eight) hours as needed for nausea or vomiting. 06/15/24   Heilingoetter, Cassandra L, PA-C  prochlorperazine  (COMPAZINE ) 10 MG tablet TAKE 1 TABLET BY MOUTH EVERY 6 HOURS AS NEEDED 06/02/24   Heilingoetter, Cassandra L, PA-C  TRELEGY ELLIPTA  100-62.5-25 MCG/ACT AEPB INHALE 1 PUFF BY MOUTH ONCE DAILY 03/29/24   Kara Dorn NOVAK, MD  venlafaxine  (EFFEXOR ) 37.5 MG tablet Take 37.5 mg by mouth in the morning. 05/25/23   [provider]    Physical Exam: BP 133/85   Pulse (!) 109   Temp 98.5 F (36.9 C) (Rectal)   Resp (!) 22   SpO2 100%  General: Patient is severely emaciated, and chronically ill in appearance.  He is sleeping and not easily arousable, he does not appear to be in any acute distress.  He remains on 5  L [place]. Cardiovascular: RRR, no murmurs or rubs, no peripheral edema  Respiratory: clear to auscultation bilaterally, no wheezes, no crackles  Abdomen: soft, nontender, nondistended, normal bowel tones heard  Skin: dry, no rashes, he has chronic weakness in his bilateral upper extremities, per wife he is numb and has almost no strength in the right upper extremity at baseline Musculoskeletal: no joint effusions, normal range of motion  Psychiatric: appropriate affect, normal speech  Neurologic: extraocular muscles intact, clear speech, moving all extremities with intact sensorium         Labs on Admission:  Basic Metabolic Panel: Recent Labs  Lab  0901  1058  1205  NA 141 136 142  K 4.3 6.3* 4.3  CL 93* 89* 94*  CO2 35*  --  41*  GLUCOSE 136* 129* 133*  BUN 18 32* 20  CREATININE 0.46* 0.60* 0.47*  CALCIUM  10.3  --  10.3   Liver Function Tests: Recent Labs  Lab  0901  1205  AST 51* 46*  ALT 25 23  ALKPHOS 136* 126  BILITOT 0.4 0.3  PROT 7.7 6.9  ALBUMIN 4.0 3.9   No results for input(s): LIPASE, AMYLASE in  the last 168 hours. No results for input(s): AMMONIA in the last 168 hours. CBC: Recent Labs  Lab  0901  1025  1058  WBC 25.5* 24.4*  --   NEUTROABS 23.2*  --   --   HGB 12.2* 13.1 14.6  HCT 40.0 43.0 43.0  MCV 98.3 100.5*  --   PLT 300 320  --    Cardiac Enzymes: No results for input(s): CKTOTAL, CKMB, CKMBINDEX, TROPONINI in the last 168 hours. BNP (last 3 results) No results for input(s): BNP in the last 8760 hours.  ProBNP (last 3 results) No results for input(s): PROBNP in the last 8760 hours.  CBG: No results for input(s): GLUCAP in the last 168 hours.  Radiological Exams on Admission: DG Chest 2 View Result Date:  EXAM: 2 VIEW(S) XRAY OF THE CHEST  11:48:00 AM COMPARISON: 06/15/2024 CLINICAL HISTORY: SOB. History of lung cancer. FINDINGS: LINES, TUBES AND DEVICES: Spinal stimulator noted. LUNGS AND PLEURA: Lungs are hypoinflated. Persistent opacification over the right mid to lower lung likely small effusion with associated basal atelectasis. Persistent opacification in the right perihilar region. Small left pleural effusion likely associated with basal atelectasis without significant change. Infection over the lung bases is possible. No pneumothorax. HEART AND MEDIASTINUM: No acute abnormality of the cardiac and mediastinal silhouettes. BONES AND SOFT TISSUES: Mottled lucency of left humerus. IMPRESSION: 1. Hypoinflated lungs with persistent right mid to lower lung opacification, likely representing small effusion and basal atelectasis. Persistent right perihilar opacification. Infection is possible. 2. Small left pleural effusion with associated basal atelectasis, without significant change. 3. Mottled lucency of the left humeral diaphysis which can be seen with metastatic disease. Recommend clinical correlation and further evaluation with a bone scan if indicated . Electronically signed by: Toribio Agreste MD   12:27 PM EST RP Workstation: [id number]   Assessment/Plan Brian [name] is a 64 y.o. male with medical history significant for stage IIIb metastatic non-small cell lung cancer that has recently been aggressively progressive despite multiple rounds of chemotherapy, immunotherapy, and radiation now being admitted to the hospital with possible sepsis due to community-acquired pneumonia.   Sepsis-meeting criteria with tachycardia, tachypnea, leukocytosis, lactate 2.4.  Source is potential community-acquired pneumonia, though he has persistent right perihilar opacification likely related to his malignancy. -Observation admission -Empiric IV azithromycin   and IV Rocephin  -He has received 2.5 L of LR bolus, lactate remains stable -Continue LR infusion -Discussed with his wife that we will admit him to the hospital for 24 to 48 hours of empiric treatment for his suspected pneumonia, she requests minimizing interventions, focusing on comfort and likely transition to hospice and full comfort care if he does not improve significantly  Stage IIIb metastatic non-small cell lung cancer-with mets to lymph nodes, given his confusion and intermittent agitation I suspect he may have some intracranial metastatic disease as well -Per his oncologist's recommendation, he is not a candidate for any further cancer treatment -Recommendation is to focus on palliation, wife is amenable to a palliative care consultation and possible transition to hospice and comfort care -Inpatient palliative care consult  Home medications for his chronic conditions will be held, as focus is on comfort  Hypertension-she had initial hypertension on arrival, I suspect this is likely due to his respiratory distress which is now improving.  Will continue his home Norvasc .  DVT prophylaxis: Lovenox      Code Status: Limited: Do not attempt resuscitation (DNR) -DNR-LIMITED -Do Not Intubate/DNI   Consults called: Palliative care  Admission  status: Observation  Time spent: 49 minutes  Rayanna Matusik CHRISTELLA Gail MD [facility] Pager [phone]  If 7PM-7AM, please contact night-coverage [email or web address] Password [email or web address]  07-23-2024, 2:04 PM

## 2024-08-03 NOTE — ED Provider Notes (Signed)
 [name] Provider Note   CSN: [id number] Arrival date & time:   1013     Patient presents with: Hypertension and Tachycardia   Brian [name] is a 64 y.o. male.   64 year old male who presents with shortness of breath and tachycardia.  History of lung as well as throat cancer.  Was at the cancer center and was found to be very weak.  According to wife who gives all the history at bedside states that for the last several days he has had increasing dyspnea.  No reported fever.  No vomiting or diarrhea noted.  Has been using his inhaler as he does have a history of COPD.  Discussed CODE STATUS with wife who states patient has a DNR       Prior to Admission medications   Medication Sig Start Date End Date Taking? Authorizing Provider  acetaminophen  (TYLENOL ) 500 MG tablet Take 1,000 mg by mouth every 6 (six) hours as needed (for pain).    [provider]  albuterol  (VENTOLIN  HFA) 108 (90 Base) MCG/ACT inhaler INHALE 2 PUFFS INTO THE LUNGS EVERY 4 (FOUR) HOURS AS NEEDED FOR WHEEZING OR SHORTNESS OF BREATH. 11/07/20   Tonna Redell SQUIBB, NP  amLODipine  (NORVASC ) 5 MG tablet Take 0.5 tablets (2.5 mg total) by mouth in the morning. 11/22/23   Will Almarie KANDICE, MD  atorvastatin  (LIPITOR) 20 MG tablet Take 20 mg by mouth in the morning.    [provider]  brigatinib  ([name] ) 30 MG tablet Take 4 tablets (120 mg total) by mouth daily. Take with or without food. Swallow tablets whole. Do not crush or chew. 06/15/24   Heilingoetter, Cassandra L, PA-C  cyclobenzaprine  (FLEXERIL ) 5 MG tablet Take 1 tablet (5 mg total) by mouth 3 (three) times daily as needed for muscle spasms. 04/10/24   Ulis Bottcher, PA-C  DULoxetine  (CYMBALTA ) 60 MG capsule Take 1 capsule (60 mg total) by mouth daily. 02/17/24   Leigh Venetia CROME, MD  fluticasone  (FLONASE ) 50 MCG/ACT nasal spray Place 1 spray into both nostrils daily. 11/23/23   Will Almarie KANDICE, MD  folic acid  (FOLVITE ) 1 MG tablet TAKE 1 TABLET BY MOUTH EVERY DAY 03/14/24   Sherrod Sherrod, MD  gabapentin  (NEURONTIN ) 600 MG tablet Take 600 mg by mouth. 02/01/24   [provider]  levothyroxine  (SYNTHROID ) 100 MCG tablet Take 100 mcg by mouth daily before breakfast.    [provider]  LORazepam  (ATIVAN ) 0.5 MG tablet 1 tablet p.o. 30 minutes before PET scan repeat x 1 if needed 05/01/24   Sherrod Sherrod, MD  methylPREDNISolone  (MEDROL  DOSEPAK) 4 MG TBPK tablet Use as instructed 06/28/24   Sherrod Sherrod, MD  MIRALAX  17 GM/SCOOP powder Take 17 g by mouth daily as needed for mild constipation.    [provider]  mirtazapine  (REMERON ) 15 MG tablet TAKE 1 TABLET BY MOUTH EVERYDAY AT BEDTIME 06/22/24   Sherrod Sherrod, MD  ondansetron  (ZOFRAN ) 8 MG tablet Take 1 tablet (8 mg total) by mouth every 8 (eight) hours as needed for nausea or vomiting. 06/15/24   Heilingoetter, Cassandra L, PA-C  prochlorperazine  (COMPAZINE ) 10 MG tablet TAKE 1 TABLET BY MOUTH EVERY 6 HOURS AS NEEDED 06/02/24   Heilingoetter, Cassandra L, PA-C  TRELEGY ELLIPTA  100-62.5-25 MCG/ACT AEPB INHALE 1 PUFF BY MOUTH ONCE DAILY 03/29/24   Kara Dorn NOVAK, MD  venlafaxine  (EFFEXOR ) 37.5 MG tablet Take 37.5 mg by mouth in the morning. 05/25/23   [provider]    Allergies: Lorazepam     Review of Systems  All other systems reviewed and are negative.   Updated Vital Signs SpO2 100%   Physical Exam Vitals and nursing note reviewed.  Constitutional:      General: He is not in acute distress.    Appearance: Normal appearance. He is well-developed. He is not toxic-appearing.  HENT:     Head: Normocephalic and atraumatic.  Eyes:     General: Lids are normal.     Conjunctiva/sclera: Conjunctivae normal.     Pupils: Pupils are equal, round, and reactive to light.  Neck:     Thyroid : No thyroid  mass.     Trachea: No tracheal deviation.  Cardiovascular:     Rate and Rhythm:  Regular rhythm. Tachycardia present.     Heart sounds: Normal heart sounds. No murmur heard.    No gallop.  Pulmonary:     Effort: Prolonged expiration and respiratory distress present.     Breath sounds: No stridor. Examination of the right-upper field reveals decreased breath sounds. Examination of the left-upper field reveals decreased breath sounds. Decreased breath sounds present. No wheezing, rhonchi or rales.  Abdominal:     General: There is no distension.     Palpations: Abdomen is soft.     Tenderness: There is no abdominal tenderness. There is no rebound.  Musculoskeletal:        General: No tenderness. Normal range of motion.     Cervical back: Normal range of motion and neck supple.  Skin:    General: Skin is warm and dry.     Findings: No abrasion or rash.  Neurological:     Mental Status: He is oriented to person, place, and time. He is lethargic.     GCS: GCS eye subscore is 4. GCS verbal subscore is 5. GCS motor subscore is 6.     Cranial Nerves: No cranial nerve deficit.     Sensory: No sensory deficit.     Comments: Withdraws to pain in extremities  Psychiatric:        Attention and Perception: Attention normal.        Speech: Speech normal.        Behavior: Behavior normal.     (all labs ordered are listed, but only abnormal results are displayed) Labs Reviewed  CULTURE, BLOOD (ROUTINE X 2)  CULTURE, BLOOD (ROUTINE X 2)  RESP PANEL BY RT-PCR (RSV, FLU A&B, COVID)  RVPGX2  CBC  I-STAT CG4 LACTIC ACID, ED  I-STAT CHEM 8, ED    EKG: EKG Interpretation Date/Time:  Wednesday July 05 2024 10:35:57 EST Ventricular Rate:  122 PR Interval:  164 QRS Duration:  96 QT Interval:  287 QTC Calculation: 409 R Axis:   80  Text Interpretation: Sinus tachycardia Probable left ventricular hypertrophy Anterior ST elevation, probably due to LVH Confirmed by Dasie Faden ([id number]) on  1:05:33 PM  Radiology: No results found.   Procedures    Medications Ordered in the ED  ipratropium (ATROVENT) nebulizer solution 0.5 mg (has no administration in time range)  albuterol  (PROVENTIL ) (2.5 MG/3ML) 0.083% nebulizer solution 2.5 mg (has no administration in time range)  lactated ringers  infusion (has no administration in time range)  lactated ringers  bolus 1,000 mL (has no administration in time range)  Medical Decision Making Amount and/or Complexity of Data Reviewed Labs: ordered. Radiology: ordered. ECG/medicine tests: ordered.  Risk Prescription drug management.   Patient is EKG shows sinus tachycardia.  Chest x-ray per interpretation shows possible pneumonia.  Code sepsis initiated after patient's elevated lactic acid came back.  CBC does show increased white count of 24,000.  IV fluid bolus given as well.  On discussion with family and patient is a DNR after our discussion. COVID and flu test negative.  Repeat lactate continues to be elevated.  Lecture lites does show elevated potassium however no EKG changes and renal function is not severely abnormal.  Will repeat potassium at this time.  Patient will require admission and will consult hospitalist team  CRITICAL CARE Performed by: Curtistine ONEIDA Dawn Total critical care time: 60 minutes Critical care time was exclusive of separately billable procedures and treating other patients. Critical care was necessary to treat or prevent imminent or life-threatening deterioration. Critical care was time spent personally by me on the following activities: development of treatment plan with patient and/or surrogate as well as nursing, discussions with consultants, evaluation of patient's response to treatment, examination of patient, obtaining history from patient or surrogate, ordering and performing treatments and interventions, ordering and review of laboratory studies, ordering and review of radiographic studies, pulse oximetry and re-evaluation of  patient's condition.     Final diagnoses:  None    ED Discharge Orders     None          Dawn Curtistine, MD  [phone]

## 2024-08-03 NOTE — ED Notes (Signed)
 Gave pt belongings to family friend designated by pt's wife. Belongings include: pants, underwear, socks.

## 2024-08-03 NOTE — ED Triage Notes (Signed)
 [facility] with SOB, tachycardic & hypertension. Pt has stage 4; very inconsistent with oral chemo home medication.

## 2024-08-03 NOTE — Progress Notes (Unsigned)
 Per Dr. Jeannett verbal order, Mr. Ferrall transported to [facility] ED, room 8.   Ms. Steil accompanied patient.  Patient transported w/wheeled transport chair with O2 at 5L/[place].Report given to Lizbeth, RN at bedside 1015.

## 2024-08-03 NOTE — Progress Notes (Addendum)
 RT called to pts room due to low sats and upon arrival pt was on nonrebreather and agonal breathing BP 43/35, RN aware and spoke with MD and advised RN to call hospitalist. Encouraged RN to call RT for further needs. Pt is DNR-comfort.

## 2024-08-03 NOTE — Plan of Care (Signed)
  Daily Progress Note   Patient Name: Brian [name]       Date:  DOB: Dec 06, 1960  Age: 64 y.o. MRN#: [id number] Attending Physician: Zella Katha HERO, MD Primary Care Physician: [name], MD Admit Date:  Length of Stay: 0 days  New consult for PMT placed at 1404. When able to review new consult, noted patient had death note in place from hospitalist timed for 1522.   Tinnie Radar, DO Palliative Care Provider PMT # [phone]  No Charge Note

## 2024-08-03 DEATH — deceased

## 2024-08-16 ENCOUNTER — Ambulatory Visit: Admitting: Neurology
